# Patient Record
Sex: Male | Born: 1955 | Race: Black or African American | Hispanic: No | Marital: Married | Smoking: Former smoker
Health system: Southern US, Community
[De-identification: ages and names within clinical notes are randomized; demographics above are authoritative.]

## PROBLEM LIST (undated history)

## (undated) DIAGNOSIS — I10 Essential (primary) hypertension: Secondary | ICD-10-CM

## (undated) DIAGNOSIS — I5022 Chronic systolic (congestive) heart failure: Secondary | ICD-10-CM

## (undated) DIAGNOSIS — N182 Chronic kidney disease, stage 2 (mild): Secondary | ICD-10-CM

## (undated) DIAGNOSIS — I480 Paroxysmal atrial fibrillation: Secondary | ICD-10-CM

## (undated) DIAGNOSIS — Z95810 Presence of automatic (implantable) cardiac defibrillator: Secondary | ICD-10-CM

## (undated) DIAGNOSIS — IMO0001 Reserved for inherently not codable concepts without codable children: Secondary | ICD-10-CM

## (undated) DIAGNOSIS — I428 Other cardiomyopathies: Secondary | ICD-10-CM

## (undated) DIAGNOSIS — I639 Cerebral infarction, unspecified: Secondary | ICD-10-CM

## (undated) DIAGNOSIS — G4733 Obstructive sleep apnea (adult) (pediatric): Secondary | ICD-10-CM

## (undated) DIAGNOSIS — E785 Hyperlipidemia, unspecified: Secondary | ICD-10-CM

## (undated) DIAGNOSIS — I472 Ventricular tachycardia, unspecified: Secondary | ICD-10-CM

## (undated) DIAGNOSIS — E876 Hypokalemia: Secondary | ICD-10-CM

## (undated) DIAGNOSIS — I4729 Other ventricular tachycardia: Secondary | ICD-10-CM

## (undated) DIAGNOSIS — Z794 Long term (current) use of insulin: Secondary | ICD-10-CM

## (undated) DIAGNOSIS — I2510 Atherosclerotic heart disease of native coronary artery without angina pectoris: Secondary | ICD-10-CM

## (undated) DIAGNOSIS — E119 Type 2 diabetes mellitus without complications: Secondary | ICD-10-CM

## (undated) DIAGNOSIS — I272 Pulmonary hypertension, unspecified: Secondary | ICD-10-CM

## (undated) HISTORY — PX: CARDIAC CATHETERIZATION: SHX172

## (undated) HISTORY — DX: Cerebral infarction, unspecified: I63.9

## (undated) HISTORY — DX: Type 2 diabetes mellitus without complications: E11.9

## (undated) HISTORY — DX: Atherosclerotic heart disease of native coronary artery without angina pectoris: I25.10

## (undated) HISTORY — DX: Reserved for inherently not codable concepts without codable children: IMO0001

## (undated) HISTORY — DX: Hyperlipidemia, unspecified: E78.5

## (undated) HISTORY — PX: CARDIAC DEFIBRILLATOR PLACEMENT: SHX171

## (undated) HISTORY — DX: Essential (primary) hypertension: I10

## (undated) HISTORY — DX: Paroxysmal atrial fibrillation: I48.0

## (undated) HISTORY — DX: Long term (current) use of insulin: Z79.4

## (undated) HISTORY — PX: LIPOMA EXCISION: SHX5283

## (undated) HISTORY — DX: Hypokalemia: E87.6

## (undated) HISTORY — DX: Obstructive sleep apnea (adult) (pediatric): G47.33

---

## 1997-10-31 ENCOUNTER — Inpatient Hospital Stay (HOSPITAL_COMMUNITY): Admission: AD | Admit: 1997-10-31 | Discharge: 1997-11-04 | Payer: Self-pay | Admitting: Family Medicine

## 1998-05-29 ENCOUNTER — Encounter: Payer: Self-pay | Admitting: Emergency Medicine

## 1998-05-29 ENCOUNTER — Emergency Department (HOSPITAL_COMMUNITY): Admission: EM | Admit: 1998-05-29 | Discharge: 1998-05-29 | Payer: Self-pay | Admitting: Emergency Medicine

## 1998-08-24 ENCOUNTER — Inpatient Hospital Stay (HOSPITAL_COMMUNITY): Admission: EM | Admit: 1998-08-24 | Discharge: 1998-08-27 | Payer: Self-pay | Admitting: Emergency Medicine

## 1998-08-24 ENCOUNTER — Encounter: Payer: Self-pay | Admitting: Emergency Medicine

## 1998-08-27 ENCOUNTER — Encounter: Payer: Self-pay | Admitting: Family Medicine

## 1999-02-10 ENCOUNTER — Encounter: Payer: Self-pay | Admitting: Emergency Medicine

## 1999-02-11 ENCOUNTER — Inpatient Hospital Stay (HOSPITAL_COMMUNITY): Admission: EM | Admit: 1999-02-11 | Discharge: 1999-02-21 | Payer: Self-pay | Admitting: Emergency Medicine

## 1999-02-11 ENCOUNTER — Encounter: Payer: Self-pay | Admitting: Family Medicine

## 1999-02-13 ENCOUNTER — Encounter: Payer: Self-pay | Admitting: Family Medicine

## 2000-02-11 ENCOUNTER — Ambulatory Visit (HOSPITAL_COMMUNITY): Admission: RE | Admit: 2000-02-11 | Discharge: 2000-02-11 | Payer: Self-pay | Admitting: *Deleted

## 2000-02-17 ENCOUNTER — Ambulatory Visit (HOSPITAL_COMMUNITY): Admission: RE | Admit: 2000-02-17 | Discharge: 2000-02-17 | Payer: Self-pay | Admitting: Family Medicine

## 2000-02-17 ENCOUNTER — Encounter: Payer: Self-pay | Admitting: Family Medicine

## 2000-08-25 ENCOUNTER — Encounter: Payer: Self-pay | Admitting: Emergency Medicine

## 2000-08-25 ENCOUNTER — Inpatient Hospital Stay (HOSPITAL_COMMUNITY): Admission: EM | Admit: 2000-08-25 | Discharge: 2000-08-27 | Payer: Self-pay | Admitting: Emergency Medicine

## 2000-12-08 ENCOUNTER — Emergency Department (HOSPITAL_COMMUNITY): Admission: EM | Admit: 2000-12-08 | Discharge: 2000-12-09 | Payer: Self-pay

## 2001-11-27 ENCOUNTER — Emergency Department (HOSPITAL_COMMUNITY): Admission: EM | Admit: 2001-11-27 | Discharge: 2001-11-28 | Payer: Self-pay

## 2001-11-28 ENCOUNTER — Encounter: Payer: Self-pay | Admitting: Emergency Medicine

## 2003-08-21 ENCOUNTER — Emergency Department (HOSPITAL_COMMUNITY): Admission: EM | Admit: 2003-08-21 | Discharge: 2003-08-21 | Payer: Self-pay | Admitting: *Deleted

## 2004-01-10 ENCOUNTER — Inpatient Hospital Stay (HOSPITAL_COMMUNITY): Admission: EM | Admit: 2004-01-10 | Discharge: 2004-01-15 | Payer: Self-pay | Admitting: Emergency Medicine

## 2004-01-11 ENCOUNTER — Encounter: Payer: Self-pay | Admitting: Cardiology

## 2004-01-17 ENCOUNTER — Ambulatory Visit: Payer: Self-pay | Admitting: *Deleted

## 2004-01-22 ENCOUNTER — Ambulatory Visit: Payer: Self-pay | Admitting: Family Medicine

## 2004-01-31 ENCOUNTER — Ambulatory Visit: Payer: Self-pay | Admitting: Family Medicine

## 2004-02-21 ENCOUNTER — Ambulatory Visit: Payer: Self-pay | Admitting: Family Medicine

## 2004-03-11 ENCOUNTER — Ambulatory Visit: Payer: Self-pay | Admitting: Cardiology

## 2004-03-13 ENCOUNTER — Ambulatory Visit: Payer: Self-pay | Admitting: Internal Medicine

## 2004-04-04 ENCOUNTER — Ambulatory Visit: Payer: Self-pay | Admitting: Cardiology

## 2004-05-09 ENCOUNTER — Ambulatory Visit: Payer: Self-pay | Admitting: Cardiology

## 2004-06-19 ENCOUNTER — Ambulatory Visit: Payer: Self-pay

## 2004-07-01 ENCOUNTER — Ambulatory Visit: Payer: Self-pay

## 2004-07-07 ENCOUNTER — Ambulatory Visit: Payer: Self-pay | Admitting: Internal Medicine

## 2004-07-11 ENCOUNTER — Ambulatory Visit (HOSPITAL_COMMUNITY): Admission: RE | Admit: 2004-07-11 | Discharge: 2004-07-11 | Payer: Self-pay | Admitting: Internal Medicine

## 2004-07-16 ENCOUNTER — Ambulatory Visit: Payer: Self-pay | Admitting: Pulmonary Disease

## 2004-07-29 ENCOUNTER — Ambulatory Visit (HOSPITAL_BASED_OUTPATIENT_CLINIC_OR_DEPARTMENT_OTHER): Admission: RE | Admit: 2004-07-29 | Discharge: 2004-07-29 | Payer: Self-pay | Admitting: Pulmonary Disease

## 2004-07-29 ENCOUNTER — Encounter: Payer: Self-pay | Admitting: Pulmonary Disease

## 2004-07-31 ENCOUNTER — Ambulatory Visit: Payer: Self-pay | Admitting: Internal Medicine

## 2004-08-01 ENCOUNTER — Ambulatory Visit: Payer: Self-pay | Admitting: Pulmonary Disease

## 2004-08-06 ENCOUNTER — Ambulatory Visit (HOSPITAL_COMMUNITY): Admission: RE | Admit: 2004-08-06 | Discharge: 2004-08-06 | Payer: Self-pay | Admitting: Internal Medicine

## 2004-08-18 ENCOUNTER — Ambulatory Visit: Payer: Self-pay | Admitting: Internal Medicine

## 2004-09-10 ENCOUNTER — Ambulatory Visit: Payer: Self-pay | Admitting: Pulmonary Disease

## 2004-09-18 ENCOUNTER — Ambulatory Visit: Payer: Self-pay | Admitting: Cardiology

## 2004-12-16 ENCOUNTER — Ambulatory Visit: Payer: Self-pay | Admitting: Cardiology

## 2004-12-23 ENCOUNTER — Ambulatory Visit: Payer: Self-pay | Admitting: Internal Medicine

## 2004-12-29 ENCOUNTER — Ambulatory Visit: Payer: Self-pay | Admitting: Internal Medicine

## 2005-01-13 ENCOUNTER — Ambulatory Visit: Payer: Self-pay | Admitting: Internal Medicine

## 2005-01-29 ENCOUNTER — Ambulatory Visit: Payer: Self-pay | Admitting: Cardiology

## 2005-02-24 ENCOUNTER — Ambulatory Visit: Payer: Self-pay | Admitting: Internal Medicine

## 2005-03-03 ENCOUNTER — Ambulatory Visit: Payer: Self-pay | Admitting: Internal Medicine

## 2005-04-14 ENCOUNTER — Ambulatory Visit: Payer: Self-pay | Admitting: Internal Medicine

## 2005-04-16 ENCOUNTER — Ambulatory Visit: Payer: Self-pay | Admitting: Internal Medicine

## 2005-06-12 ENCOUNTER — Ambulatory Visit: Payer: Self-pay | Admitting: Internal Medicine

## 2005-06-15 ENCOUNTER — Ambulatory Visit: Payer: Self-pay | Admitting: Cardiology

## 2005-06-22 ENCOUNTER — Ambulatory Visit: Payer: Self-pay | Admitting: Cardiology

## 2005-06-24 ENCOUNTER — Ambulatory Visit: Payer: Self-pay | Admitting: Internal Medicine

## 2005-07-01 ENCOUNTER — Ambulatory Visit: Payer: Self-pay | Admitting: Internal Medicine

## 2005-07-01 ENCOUNTER — Ambulatory Visit: Payer: Self-pay

## 2005-07-01 ENCOUNTER — Encounter: Payer: Self-pay | Admitting: Cardiology

## 2005-07-14 ENCOUNTER — Ambulatory Visit: Payer: Self-pay | Admitting: Cardiology

## 2005-09-07 ENCOUNTER — Ambulatory Visit: Payer: Self-pay | Admitting: Internal Medicine

## 2005-09-29 ENCOUNTER — Ambulatory Visit: Payer: Self-pay | Admitting: Internal Medicine

## 2005-09-30 ENCOUNTER — Ambulatory Visit: Payer: Self-pay | Admitting: Internal Medicine

## 2005-10-09 ENCOUNTER — Ambulatory Visit: Payer: Self-pay | Admitting: Internal Medicine

## 2005-11-09 ENCOUNTER — Ambulatory Visit: Payer: Self-pay | Admitting: Internal Medicine

## 2005-11-09 ENCOUNTER — Ambulatory Visit: Payer: Self-pay | Admitting: Cardiology

## 2005-12-18 ENCOUNTER — Inpatient Hospital Stay (HOSPITAL_COMMUNITY): Admission: RE | Admit: 2005-12-18 | Discharge: 2005-12-19 | Payer: Self-pay | Admitting: Internal Medicine

## 2005-12-18 ENCOUNTER — Ambulatory Visit: Payer: Self-pay | Admitting: Internal Medicine

## 2005-12-21 ENCOUNTER — Ambulatory Visit: Payer: Self-pay | Admitting: Internal Medicine

## 2005-12-31 ENCOUNTER — Ambulatory Visit: Payer: Self-pay

## 2005-12-31 ENCOUNTER — Ambulatory Visit: Payer: Self-pay | Admitting: Internal Medicine

## 2006-01-26 ENCOUNTER — Ambulatory Visit: Payer: Self-pay | Admitting: Internal Medicine

## 2006-03-02 ENCOUNTER — Ambulatory Visit: Payer: Self-pay | Admitting: Internal Medicine

## 2006-04-08 ENCOUNTER — Ambulatory Visit: Payer: Self-pay | Admitting: Cardiovascular Disease

## 2006-04-09 ENCOUNTER — Ambulatory Visit: Payer: Self-pay | Admitting: Internal Medicine

## 2006-04-13 ENCOUNTER — Ambulatory Visit: Payer: Self-pay | Admitting: Internal Medicine

## 2006-04-19 ENCOUNTER — Ambulatory Visit: Payer: Self-pay

## 2006-04-19 ENCOUNTER — Encounter: Payer: Self-pay | Admitting: Cardiology

## 2006-04-29 ENCOUNTER — Ambulatory Visit: Payer: Self-pay | Admitting: Cardiology

## 2006-05-13 ENCOUNTER — Ambulatory Visit: Payer: Self-pay | Admitting: Internal Medicine

## 2006-06-01 ENCOUNTER — Ambulatory Visit: Payer: Self-pay | Admitting: Cardiology

## 2006-06-04 ENCOUNTER — Ambulatory Visit: Payer: Self-pay | Admitting: Cardiology

## 2006-06-04 LAB — CONVERTED CEMR LAB
BUN: 10 mg/dL (ref 6–23)
Calcium: 8.9 mg/dL (ref 8.4–10.5)
GFR calc Af Amer: 115 mL/min
GFR calc non Af Amer: 95 mL/min
Potassium: 3.5 meq/L (ref 3.5–5.1)

## 2006-06-25 ENCOUNTER — Ambulatory Visit: Payer: Self-pay | Admitting: Internal Medicine

## 2006-06-25 LAB — CONVERTED CEMR LAB
CO2: 30 meq/L (ref 19–32)
Creatinine, Ser: 1 mg/dL (ref 0.4–1.5)
GFR calc Af Amer: 102 mL/min

## 2006-07-29 ENCOUNTER — Ambulatory Visit: Payer: Self-pay | Admitting: Internal Medicine

## 2006-07-29 LAB — CONVERTED CEMR LAB
Calcium: 8.8 mg/dL (ref 8.4–10.5)
Chloride: 110 meq/L (ref 96–112)
GFR calc non Af Amer: 84 mL/min
Pro B Natriuretic peptide (BNP): 303 pg/mL — ABNORMAL HIGH (ref 0.0–100.0)
Sodium: 143 meq/L (ref 135–145)

## 2006-08-02 ENCOUNTER — Ambulatory Visit: Payer: Self-pay | Admitting: Internal Medicine

## 2006-08-02 LAB — CONVERTED CEMR LAB
Creatinine,U: 156.6 mg/dL
Hgb A1c MFr Bld: 5.6 % (ref 4.6–6.0)
Microalb Creat Ratio: 4.5 mg/g (ref 0.0–30.0)

## 2006-08-16 ENCOUNTER — Ambulatory Visit: Payer: Self-pay

## 2006-09-02 ENCOUNTER — Ambulatory Visit: Payer: Self-pay | Admitting: Cardiology

## 2006-11-11 ENCOUNTER — Ambulatory Visit: Payer: Self-pay | Admitting: Cardiovascular Disease

## 2006-12-02 ENCOUNTER — Ambulatory Visit: Payer: Self-pay | Admitting: Internal Medicine

## 2006-12-06 ENCOUNTER — Ambulatory Visit: Payer: Self-pay | Admitting: Internal Medicine

## 2006-12-06 LAB — CONVERTED CEMR LAB
ALT: 34 units/L (ref 0–53)
Albumin: 3.5 g/dL (ref 3.5–5.2)
Alkaline Phosphatase: 52 units/L (ref 39–117)
LDL Cholesterol: 96 mg/dL (ref 0–99)

## 2006-12-14 ENCOUNTER — Ambulatory Visit: Payer: Self-pay | Admitting: Cardiology

## 2006-12-15 ENCOUNTER — Ambulatory Visit: Payer: Self-pay | Admitting: Internal Medicine

## 2007-01-05 ENCOUNTER — Ambulatory Visit: Payer: Self-pay | Admitting: Gastroenterology

## 2007-02-02 ENCOUNTER — Ambulatory Visit: Payer: Self-pay | Admitting: Internal Medicine

## 2007-02-07 ENCOUNTER — Ambulatory Visit: Payer: Self-pay | Admitting: Gastroenterology

## 2007-02-07 LAB — HM COLONOSCOPY: HM Colonoscopy: ABNORMAL

## 2007-03-21 ENCOUNTER — Ambulatory Visit: Payer: Self-pay

## 2007-05-06 DIAGNOSIS — D179 Benign lipomatous neoplasm, unspecified: Secondary | ICD-10-CM | POA: Insufficient documentation

## 2007-05-06 DIAGNOSIS — E785 Hyperlipidemia, unspecified: Secondary | ICD-10-CM | POA: Insufficient documentation

## 2007-05-06 DIAGNOSIS — M109 Gout, unspecified: Secondary | ICD-10-CM | POA: Insufficient documentation

## 2007-05-06 DIAGNOSIS — I6789 Other cerebrovascular disease: Secondary | ICD-10-CM

## 2007-05-06 DIAGNOSIS — I5042 Chronic combined systolic (congestive) and diastolic (congestive) heart failure: Secondary | ICD-10-CM

## 2007-05-06 DIAGNOSIS — K029 Dental caries, unspecified: Secondary | ICD-10-CM | POA: Insufficient documentation

## 2007-05-09 ENCOUNTER — Telehealth: Payer: Self-pay | Admitting: Internal Medicine

## 2007-05-09 ENCOUNTER — Ambulatory Visit: Payer: Self-pay | Admitting: Internal Medicine

## 2007-05-09 DIAGNOSIS — E876 Hypokalemia: Secondary | ICD-10-CM

## 2007-05-09 DIAGNOSIS — J069 Acute upper respiratory infection, unspecified: Secondary | ICD-10-CM | POA: Insufficient documentation

## 2007-05-09 DIAGNOSIS — I119 Hypertensive heart disease without heart failure: Secondary | ICD-10-CM

## 2007-05-11 LAB — CONVERTED CEMR LAB
ALT: 19 units/L (ref 0–53)
Alkaline Phosphatase: 58 units/L (ref 39–117)
BUN: 13 mg/dL (ref 6–23)
CO2: 34 meq/L — ABNORMAL HIGH (ref 19–32)
Calcium: 8.9 mg/dL (ref 8.4–10.5)
GFR calc Af Amer: 91 mL/min
GFR calc non Af Amer: 75 mL/min
Hgb A1c MFr Bld: 5.5 % (ref 4.6–6.0)
Total CHOL/HDL Ratio: 7
Triglycerides: 92 mg/dL (ref 0–149)

## 2007-05-17 ENCOUNTER — Telehealth: Payer: Self-pay | Admitting: Internal Medicine

## 2007-05-17 ENCOUNTER — Ambulatory Visit: Payer: Self-pay | Admitting: Internal Medicine

## 2007-05-25 LAB — CONVERTED CEMR LAB
BUN: 8 mg/dL (ref 6–23)
CO2: 32 meq/L (ref 19–32)
Creatinine, Ser: 1.1 mg/dL (ref 0.4–1.5)
GFR calc Af Amer: 91 mL/min
GFR calc non Af Amer: 75 mL/min
Potassium: 3.6 meq/L (ref 3.5–5.1)

## 2007-06-09 ENCOUNTER — Ambulatory Visit: Payer: Self-pay

## 2007-06-15 ENCOUNTER — Ambulatory Visit: Payer: Self-pay | Admitting: Cardiology

## 2007-07-15 ENCOUNTER — Encounter: Payer: Self-pay | Admitting: Internal Medicine

## 2007-08-04 ENCOUNTER — Encounter: Payer: Self-pay | Admitting: Internal Medicine

## 2007-09-06 ENCOUNTER — Ambulatory Visit: Payer: Self-pay | Admitting: Internal Medicine

## 2007-09-14 ENCOUNTER — Ambulatory Visit: Payer: Self-pay

## 2007-09-20 ENCOUNTER — Ambulatory Visit (HOSPITAL_COMMUNITY): Admission: RE | Admit: 2007-09-20 | Discharge: 2007-09-20 | Payer: Self-pay | Admitting: General Surgery

## 2007-09-20 ENCOUNTER — Encounter: Payer: Self-pay | Admitting: Internal Medicine

## 2007-09-20 ENCOUNTER — Encounter (INDEPENDENT_AMBULATORY_CARE_PROVIDER_SITE_OTHER): Payer: Self-pay | Admitting: General Surgery

## 2007-10-11 ENCOUNTER — Ambulatory Visit: Payer: Self-pay | Admitting: Internal Medicine

## 2007-10-11 LAB — CONVERTED CEMR LAB
ALT: 29 units/L (ref 0–53)
AST: 27 units/L (ref 0–37)
Cholesterol: 144 mg/dL (ref 0–200)
HDL: 33.5 mg/dL — ABNORMAL LOW (ref >39.0)
Hgb A1c MFr Bld: 5.9 % (ref 4.6–6.0)
TSH: 0.75 microintl units/mL (ref 0.35–5.50)
Triglycerides: 62 mg/dL (ref 0–149)
VLDL: 12 mg/dL (ref 0–40)

## 2007-10-14 ENCOUNTER — Encounter: Payer: Self-pay | Admitting: Internal Medicine

## 2007-10-18 ENCOUNTER — Ambulatory Visit: Payer: Self-pay | Admitting: Internal Medicine

## 2007-12-06 ENCOUNTER — Ambulatory Visit: Payer: Self-pay | Admitting: Internal Medicine

## 2007-12-06 DIAGNOSIS — Z8679 Personal history of other diseases of the circulatory system: Secondary | ICD-10-CM

## 2007-12-09 ENCOUNTER — Ambulatory Visit: Payer: Self-pay | Admitting: Cardiology

## 2007-12-21 ENCOUNTER — Ambulatory Visit: Payer: Self-pay

## 2007-12-21 ENCOUNTER — Encounter: Payer: Self-pay | Admitting: Cardiology

## 2007-12-21 ENCOUNTER — Telehealth (INDEPENDENT_AMBULATORY_CARE_PROVIDER_SITE_OTHER): Payer: Self-pay | Admitting: *Deleted

## 2007-12-23 ENCOUNTER — Ambulatory Visit: Payer: Self-pay | Admitting: Internal Medicine

## 2008-01-04 ENCOUNTER — Ambulatory Visit: Payer: Self-pay | Admitting: Cardiology

## 2008-01-04 LAB — CONVERTED CEMR LAB
CO2: 30 meq/L (ref 19–32)
Glucose, Bld: 118 mg/dL — ABNORMAL HIGH (ref 70–99)
Hemoglobin: 13.9 g/dL (ref 13.0–17.0)
INR: 1.1 — ABNORMAL HIGH (ref 0.8–1.0)
Lymphocytes Relative: 30.9 % (ref 12.0–46.0)
MCHC: 33.6 g/dL (ref 30.0–36.0)
Platelets: 195 10*3/uL (ref 150–400)
Potassium: 3 meq/L — ABNORMAL LOW (ref 3.5–5.1)
Prothrombin Time: 12.9 s (ref 10.9–13.3)
RDW: 12 % (ref 11.5–14.6)
Sodium: 145 meq/L (ref 135–145)

## 2008-01-10 ENCOUNTER — Ambulatory Visit: Payer: Self-pay | Admitting: Cardiology

## 2008-01-10 ENCOUNTER — Inpatient Hospital Stay (HOSPITAL_BASED_OUTPATIENT_CLINIC_OR_DEPARTMENT_OTHER): Admission: RE | Admit: 2008-01-10 | Discharge: 2008-01-10 | Payer: Self-pay | Admitting: Cardiology

## 2008-01-19 ENCOUNTER — Ambulatory Visit: Payer: Self-pay | Admitting: Cardiology

## 2008-02-01 ENCOUNTER — Ambulatory Visit: Payer: Self-pay

## 2008-02-24 ENCOUNTER — Ambulatory Visit (HOSPITAL_BASED_OUTPATIENT_CLINIC_OR_DEPARTMENT_OTHER): Admission: RE | Admit: 2008-02-24 | Discharge: 2008-02-24 | Payer: Self-pay | Admitting: Cardiology

## 2008-02-24 ENCOUNTER — Encounter: Payer: Self-pay | Admitting: Pulmonary Disease

## 2008-03-02 ENCOUNTER — Ambulatory Visit: Payer: Self-pay | Admitting: Pulmonary Disease

## 2008-04-23 ENCOUNTER — Ambulatory Visit: Payer: Self-pay

## 2008-05-10 ENCOUNTER — Ambulatory Visit: Payer: Self-pay | Admitting: Cardiology

## 2008-05-14 ENCOUNTER — Ambulatory Visit: Payer: Self-pay | Admitting: Internal Medicine

## 2008-05-14 ENCOUNTER — Ambulatory Visit: Payer: Self-pay | Admitting: Cardiology

## 2008-05-14 LAB — CONVERTED CEMR LAB
ALT: 19 units/L (ref 0–53)
Calcium: 9.1 mg/dL (ref 8.4–10.5)
Cholesterol: 167 mg/dL (ref 0–200)
GFR calc Af Amer: 90 mL/min
LDL Cholesterol: 117 mg/dL — ABNORMAL HIGH (ref 0–99)
Potassium: 3.8 meq/L (ref 3.5–5.1)
TSH: 1.21 microintl units/mL (ref 0.35–5.50)
Total Bilirubin: 1.3 mg/dL — ABNORMAL HIGH (ref 0.3–1.2)
Total Protein: 7.2 g/dL (ref 6.0–8.3)
Triglycerides: 69 mg/dL (ref 0–149)
VLDL: 14 mg/dL (ref 0–40)

## 2008-05-18 ENCOUNTER — Ambulatory Visit: Payer: Self-pay | Admitting: Cardiovascular Disease

## 2008-05-23 ENCOUNTER — Ambulatory Visit: Payer: Self-pay | Admitting: Cardiovascular Disease

## 2008-06-01 ENCOUNTER — Ambulatory Visit: Payer: Self-pay | Admitting: Cardiovascular Disease

## 2008-06-05 ENCOUNTER — Encounter: Payer: Self-pay | Admitting: Internal Medicine

## 2008-06-11 ENCOUNTER — Ambulatory Visit: Payer: Self-pay | Admitting: Pulmonary Disease

## 2008-06-11 ENCOUNTER — Ambulatory Visit: Payer: Self-pay | Admitting: Internal Medicine

## 2008-06-11 DIAGNOSIS — G4733 Obstructive sleep apnea (adult) (pediatric): Secondary | ICD-10-CM

## 2008-06-15 ENCOUNTER — Ambulatory Visit: Payer: Self-pay | Admitting: Internal Medicine

## 2008-06-20 ENCOUNTER — Encounter: Payer: Self-pay | Admitting: Internal Medicine

## 2008-06-27 ENCOUNTER — Ambulatory Visit: Payer: Self-pay | Admitting: Cardiology

## 2008-07-06 ENCOUNTER — Ambulatory Visit: Payer: Self-pay | Admitting: Internal Medicine

## 2008-07-10 ENCOUNTER — Encounter: Payer: Self-pay | Admitting: Internal Medicine

## 2008-07-11 ENCOUNTER — Ambulatory Visit: Payer: Self-pay | Admitting: Cardiology

## 2008-07-12 ENCOUNTER — Ambulatory Visit: Payer: Self-pay | Admitting: Pulmonary Disease

## 2008-07-12 ENCOUNTER — Ambulatory Visit: Payer: Self-pay | Admitting: Internal Medicine

## 2008-07-12 LAB — CONVERTED CEMR LAB
Albumin: 3.6 g/dL (ref 3.5–5.2)
Alkaline Phosphatase: 50 units/L (ref 39–117)
Creatinine,U: 288.9 mg/dL
Microalb Creat Ratio: 4.5 mg/g (ref 0.0–30.0)
Total CHOL/HDL Ratio: 4.1
Total Protein: 6.9 g/dL (ref 6.0–8.3)

## 2008-07-20 ENCOUNTER — Ambulatory Visit: Payer: Self-pay | Admitting: Cardiology

## 2008-07-24 ENCOUNTER — Ambulatory Visit: Payer: Self-pay | Admitting: Internal Medicine

## 2008-07-24 ENCOUNTER — Telehealth (INDEPENDENT_AMBULATORY_CARE_PROVIDER_SITE_OTHER): Payer: Self-pay | Admitting: *Deleted

## 2008-07-24 DIAGNOSIS — M109 Gout, unspecified: Secondary | ICD-10-CM | POA: Insufficient documentation

## 2008-07-27 ENCOUNTER — Ambulatory Visit: Payer: Self-pay | Admitting: Cardiology

## 2008-07-28 ENCOUNTER — Encounter: Payer: Self-pay | Admitting: Pulmonary Disease

## 2008-08-07 ENCOUNTER — Ambulatory Visit: Payer: Self-pay | Admitting: Cardiovascular Disease

## 2008-08-10 ENCOUNTER — Ambulatory Visit: Payer: Self-pay

## 2008-08-28 ENCOUNTER — Ambulatory Visit: Payer: Self-pay | Admitting: Cardiology

## 2008-08-28 ENCOUNTER — Ambulatory Visit: Payer: Self-pay | Admitting: Internal Medicine

## 2008-08-28 DIAGNOSIS — I472 Ventricular tachycardia, unspecified: Secondary | ICD-10-CM | POA: Insufficient documentation

## 2008-08-28 DIAGNOSIS — I4891 Unspecified atrial fibrillation: Secondary | ICD-10-CM

## 2008-09-07 ENCOUNTER — Ambulatory Visit: Payer: Self-pay | Admitting: Cardiovascular Disease

## 2008-09-21 ENCOUNTER — Ambulatory Visit: Payer: Self-pay | Admitting: Internal Medicine

## 2008-10-03 ENCOUNTER — Encounter: Payer: Self-pay | Admitting: *Deleted

## 2008-10-11 ENCOUNTER — Ambulatory Visit: Payer: Self-pay | Admitting: Cardiology

## 2008-10-11 ENCOUNTER — Ambulatory Visit: Payer: Self-pay | Admitting: Cardiovascular Disease

## 2008-10-11 ENCOUNTER — Encounter: Payer: Self-pay | Admitting: Internal Medicine

## 2008-10-11 DIAGNOSIS — I2510 Atherosclerotic heart disease of native coronary artery without angina pectoris: Secondary | ICD-10-CM

## 2008-10-11 LAB — CONVERTED CEMR LAB
POC INR: 2.2
Protime: 18.2

## 2008-11-07 ENCOUNTER — Encounter: Payer: Self-pay | Admitting: *Deleted

## 2008-11-08 ENCOUNTER — Ambulatory Visit: Payer: Self-pay | Admitting: Internal Medicine

## 2008-11-08 LAB — CONVERTED CEMR LAB
POC INR: 2.4
Prothrombin Time: 18.8 s

## 2008-11-30 ENCOUNTER — Encounter: Payer: Self-pay | Admitting: Cardiology

## 2008-12-06 ENCOUNTER — Ambulatory Visit: Payer: Self-pay | Admitting: Cardiology

## 2008-12-06 LAB — CONVERTED CEMR LAB
POC INR: 1.3
Prothrombin Time: 14.2 s

## 2008-12-10 ENCOUNTER — Ambulatory Visit: Payer: Self-pay | Admitting: Internal Medicine

## 2008-12-10 DIAGNOSIS — R509 Fever, unspecified: Secondary | ICD-10-CM

## 2008-12-10 LAB — CONVERTED CEMR LAB
BUN: 12 mg/dL (ref 6–23)
CO2: 31 meq/L (ref 19–32)
Chloride: 110 meq/L (ref 96–112)
Creatinine, Ser: 1 mg/dL (ref 0.4–1.5)
Hgb A1c MFr Bld: 5.5 % (ref 4.6–6.5)
LDL Cholesterol: 150 mg/dL — ABNORMAL HIGH (ref 0–99)
Total CHOL/HDL Ratio: 6
Triglycerides: 60 mg/dL (ref 0.0–149.0)
VLDL: 12 mg/dL (ref 0.0–40.0)

## 2008-12-13 ENCOUNTER — Ambulatory Visit: Payer: Self-pay | Admitting: Internal Medicine

## 2008-12-13 LAB — CONVERTED CEMR LAB: POC INR: 2.2

## 2009-01-10 ENCOUNTER — Encounter: Payer: Self-pay | Admitting: Internal Medicine

## 2009-01-10 ENCOUNTER — Ambulatory Visit: Payer: Self-pay | Admitting: Cardiovascular Disease

## 2009-01-10 ENCOUNTER — Ambulatory Visit: Payer: Self-pay

## 2009-01-10 ENCOUNTER — Ambulatory Visit: Payer: Self-pay | Admitting: Pulmonary Disease

## 2009-01-10 LAB — CONVERTED CEMR LAB: POC INR: 2.8

## 2009-01-31 ENCOUNTER — Telehealth (INDEPENDENT_AMBULATORY_CARE_PROVIDER_SITE_OTHER): Payer: Self-pay | Admitting: *Deleted

## 2009-02-07 ENCOUNTER — Ambulatory Visit: Payer: Self-pay | Admitting: Internal Medicine

## 2009-02-07 LAB — CONVERTED CEMR LAB: POC INR: 1.7

## 2009-02-21 ENCOUNTER — Ambulatory Visit: Payer: Self-pay | Admitting: Cardiology

## 2009-02-21 LAB — CONVERTED CEMR LAB
INR: 1.4
POC INR: 1.4

## 2009-02-25 ENCOUNTER — Ambulatory Visit: Payer: Self-pay | Admitting: Internal Medicine

## 2009-03-01 ENCOUNTER — Ambulatory Visit: Payer: Self-pay | Admitting: Internal Medicine

## 2009-03-01 LAB — CONVERTED CEMR LAB: POC INR: 1.7

## 2009-03-15 ENCOUNTER — Ambulatory Visit: Payer: Self-pay | Admitting: Cardiology

## 2009-03-15 LAB — CONVERTED CEMR LAB: POC INR: 1.6

## 2009-03-29 ENCOUNTER — Ambulatory Visit: Payer: Self-pay | Admitting: Internal Medicine

## 2009-03-29 LAB — CONVERTED CEMR LAB: POC INR: 2.5

## 2009-04-11 ENCOUNTER — Ambulatory Visit: Payer: Self-pay | Admitting: Cardiology

## 2009-04-11 ENCOUNTER — Ambulatory Visit: Payer: Self-pay | Admitting: Cardiovascular Disease

## 2009-04-11 ENCOUNTER — Encounter: Payer: Self-pay | Admitting: Internal Medicine

## 2009-05-02 ENCOUNTER — Encounter (INDEPENDENT_AMBULATORY_CARE_PROVIDER_SITE_OTHER): Payer: Self-pay | Admitting: Cardiology

## 2009-05-02 ENCOUNTER — Ambulatory Visit: Payer: Self-pay | Admitting: Internal Medicine

## 2009-05-02 LAB — CONVERTED CEMR LAB: POC INR: 2.3

## 2009-05-17 ENCOUNTER — Telehealth: Payer: Self-pay | Admitting: Internal Medicine

## 2009-05-30 ENCOUNTER — Ambulatory Visit: Payer: Self-pay | Admitting: Internal Medicine

## 2009-05-30 LAB — CONVERTED CEMR LAB: POC INR: 2

## 2009-06-17 ENCOUNTER — Ambulatory Visit: Payer: Self-pay | Admitting: Internal Medicine

## 2009-06-17 DIAGNOSIS — R5381 Other malaise: Secondary | ICD-10-CM

## 2009-06-17 DIAGNOSIS — R5383 Other fatigue: Secondary | ICD-10-CM

## 2009-06-17 LAB — CONVERTED CEMR LAB
Bilirubin Urine: NEGATIVE
CO2: 32 meq/L (ref 19–32)
Chloride: 110 meq/L (ref 96–112)
Eosinophils Relative: 1.5 % (ref 0.0–5.0)
Folate: 9.8 ng/mL
Glucose, Bld: 119 mg/dL — ABNORMAL HIGH (ref 70–99)
Hemoglobin, Urine: NEGATIVE
Hgb A1c MFr Bld: 5.8 % (ref 4.6–6.5)
Iron: 72 ug/dL (ref 42–165)
Ketones, ur: NEGATIVE mg/dL
LDL Cholesterol: 103 mg/dL — ABNORMAL HIGH (ref 0–99)
Leukocytes, UA: NEGATIVE
Lymphocytes Relative: 22.4 % (ref 12.0–46.0)
MCV: 97.9 fL (ref 78.0–100.0)
Microalb Creat Ratio: 15.1 mg/g (ref 0.0–30.0)
Monocytes Absolute: 0.5 10*3/uL (ref 0.1–1.0)
Neutrophils Relative %: 63.3 % (ref 43.0–77.0)
Platelets: 171 10*3/uL (ref 150.0–400.0)
Saturation Ratios: 21.5 % (ref 20.0–50.0)
Sed Rate: 7 mm/hr (ref 0–22)
Sodium: 145 meq/L (ref 135–145)
Specific Gravity, Urine: 1.01 (ref 1.000–1.030)
TSH: 0.83 microintl units/mL (ref 0.35–5.50)
Total CHOL/HDL Ratio: 3
Transferrin: 238.9 mg/dL (ref 212.0–360.0)
Uric Acid, Serum: 6.2 mg/dL (ref 4.0–7.8)
Urobilinogen, UA: 1 (ref 0.0–1.0)
WBC: 4.3 10*3/uL — ABNORMAL LOW (ref 4.5–10.5)

## 2009-06-18 LAB — CONVERTED CEMR LAB: Vit D, 25-Hydroxy: 36 ng/mL (ref 30–89)

## 2009-06-27 ENCOUNTER — Ambulatory Visit: Payer: Self-pay | Admitting: Cardiology

## 2009-06-27 LAB — CONVERTED CEMR LAB: POC INR: 2

## 2009-07-04 ENCOUNTER — Encounter: Payer: Self-pay | Admitting: Internal Medicine

## 2009-07-04 ENCOUNTER — Ambulatory Visit: Payer: Self-pay

## 2009-07-16 ENCOUNTER — Encounter: Payer: Self-pay | Admitting: Internal Medicine

## 2009-07-26 ENCOUNTER — Ambulatory Visit: Payer: Self-pay | Admitting: Internal Medicine

## 2009-08-15 ENCOUNTER — Ambulatory Visit: Payer: Self-pay | Admitting: Internal Medicine

## 2009-08-15 ENCOUNTER — Ambulatory Visit: Payer: Self-pay | Admitting: Cardiology

## 2009-08-15 DIAGNOSIS — Z95810 Presence of automatic (implantable) cardiac defibrillator: Secondary | ICD-10-CM

## 2009-09-12 ENCOUNTER — Ambulatory Visit: Payer: Self-pay | Admitting: Internal Medicine

## 2009-09-12 LAB — CONVERTED CEMR LAB: POC INR: 2.5

## 2009-10-11 ENCOUNTER — Ambulatory Visit: Payer: Self-pay | Admitting: Cardiovascular Disease

## 2009-10-28 ENCOUNTER — Telehealth: Payer: Self-pay | Admitting: Internal Medicine

## 2009-11-08 ENCOUNTER — Ambulatory Visit: Payer: Self-pay | Admitting: Cardiovascular Disease

## 2009-11-11 ENCOUNTER — Encounter: Payer: Self-pay | Admitting: Internal Medicine

## 2009-11-11 ENCOUNTER — Ambulatory Visit: Payer: Self-pay | Admitting: Internal Medicine

## 2009-12-06 ENCOUNTER — Ambulatory Visit: Payer: Self-pay | Admitting: Cardiovascular Disease

## 2009-12-11 ENCOUNTER — Telehealth: Payer: Self-pay | Admitting: Internal Medicine

## 2009-12-24 ENCOUNTER — Ambulatory Visit: Payer: Self-pay | Admitting: Internal Medicine

## 2010-01-03 ENCOUNTER — Ambulatory Visit: Payer: Self-pay | Admitting: Cardiology

## 2010-01-09 ENCOUNTER — Ambulatory Visit: Payer: Self-pay | Admitting: Pulmonary Disease

## 2010-01-09 ENCOUNTER — Ambulatory Visit: Payer: Self-pay | Admitting: Internal Medicine

## 2010-01-09 LAB — CONVERTED CEMR LAB
BUN: 10 mg/dL (ref 6–23)
CO2: 31 meq/L (ref 19–32)
Calcium: 8.8 mg/dL (ref 8.4–10.5)
Chloride: 106 meq/L (ref 96–112)
Cholesterol: 141 mg/dL (ref 0–200)
Creatinine, Ser: 1.1 mg/dL (ref 0.4–1.5)
GFR calc non Af Amer: 92.72 mL/min (ref >60)
Glucose, Bld: 122 mg/dL — ABNORMAL HIGH (ref 70–99)
HDL: 35 mg/dL — ABNORMAL LOW (ref >39.00)
Hgb A1c MFr Bld: 6 % (ref 4.6–6.5)
LDL Cholesterol: 90 mg/dL (ref 0–99)
Potassium: 3.8 meq/L (ref 3.5–5.1)
Sodium: 144 meq/L (ref 135–145)
Total CHOL/HDL Ratio: 4
Triglycerides: 80 mg/dL (ref 0.0–149.0)
VLDL: 16 mg/dL (ref 0.0–40.0)

## 2010-01-31 ENCOUNTER — Encounter (INDEPENDENT_AMBULATORY_CARE_PROVIDER_SITE_OTHER): Payer: Self-pay | Admitting: *Deleted

## 2010-01-31 ENCOUNTER — Ambulatory Visit: Payer: Self-pay | Admitting: Cardiology

## 2010-02-13 ENCOUNTER — Ambulatory Visit: Payer: Self-pay

## 2010-02-13 ENCOUNTER — Ambulatory Visit: Payer: Self-pay | Admitting: Internal Medicine

## 2010-02-13 LAB — CONVERTED CEMR LAB: POC INR: 1.9

## 2010-02-18 ENCOUNTER — Ambulatory Visit: Payer: Self-pay | Admitting: Internal Medicine

## 2010-03-01 ENCOUNTER — Encounter: Payer: Self-pay | Admitting: Pulmonary Disease

## 2010-03-07 ENCOUNTER — Ambulatory Visit: Payer: Self-pay | Admitting: Cardiology

## 2010-03-07 LAB — CONVERTED CEMR LAB: POC INR: 2.9

## 2010-03-17 ENCOUNTER — Telehealth: Payer: Self-pay | Admitting: Internal Medicine

## 2010-04-11 ENCOUNTER — Ambulatory Visit: Payer: Self-pay | Admitting: Internal Medicine

## 2010-05-06 ENCOUNTER — Ambulatory Visit: Admission: RE | Admit: 2010-05-06 | Discharge: 2010-05-06 | Payer: Self-pay | Source: Home / Self Care

## 2010-05-21 ENCOUNTER — Encounter: Payer: Self-pay | Admitting: Internal Medicine

## 2010-05-21 ENCOUNTER — Ambulatory Visit: Admission: RE | Admit: 2010-05-21 | Discharge: 2010-05-21 | Payer: Self-pay | Source: Home / Self Care

## 2010-05-25 ENCOUNTER — Encounter: Payer: Self-pay | Admitting: Internal Medicine

## 2010-06-03 NOTE — Progress Notes (Signed)
Summary: refill--Klor Con  Phone Note Refill Request Message from:  Fax from [address] on December 11, 2009 10:41 AM  Refills Requested: Medication #1:  KLOR-CON M20 20 MEQ  TBCR 2 tablets by mouth three times a day   Dosage confirmed as above?Dosage Confirmed   Supply Requested: 120   Last Refilled: 09/30/2009 Next Appointment Scheduled: 12-18-09  Dr Jenny Reichmann Initial call taken by: Kelle Darting CMA Deborra Medina),  December 11, 2009 10:41 AM    Prescriptions: KLOR-CON M20 20 MEQ  TBCR (POTASSIUM CHLORIDE CRYS CR) 2 tablets by mouth three times a day  #120 Tablet x 0   Entered by:   Kelle Darting CMA (Elizabeth)   Authorized by:   Biagio Borg MD   Signed by:   Kelle Darting CMA (Hercules) on 12/11/2009   Method used:   [facility]        [facility] [address] [phone][facility] (retail)       [address]       [place], [place]  [address]       Ph: [phone] or [phone]       Fax: [phone]   RxID:   [id number]

## 2010-06-03 NOTE — Miscellaneous (Signed)
   Clinical Lists Changes  Orders: Added new Referral order of DME Referral (DME) - Signed optimal pressure 11cm, but compliance was poor.  Pressure optimization is not very accurate due to noncompliance.  will work on this and repeat.

## 2010-06-03 NOTE — Medication Information (Signed)
Summary: rov/ewj  Anticoagulant Therapy  Managed by: Freddrick March, RN, BSN PCP: Dr Cathlean Cower Supervising MD: Verl Blalock MD, Marcello Moores Indication 1: Atrial Fibrillation (ICD-427.31) Lab Used: [facility] Site: [facility] INR POC 1.8 INR RANGE 2 - 3  Dietary changes: no    Health status changes: no    Bleeding/hemorrhagic complications: no    Recent/future hospitalizations: no    Any changes in medication regimen? yes       Details: Started on Lipitor2-3 weeks ago, d/c Simvastatin. Started new fiber supplement.  Recent/future dental: no  Any missed doses?: yes     Details: May have missed 1 dose last week.   Is patient compliant with meds? yes       Allergies: No Known Drug Allergies  Anticoagulation Management History:      The patient is taking warfarin and comes in today for a routine follow up visit.  Positive risk factors for bleeding include presence of serious comorbidities.  Negative risk factors for bleeding include an age less than 73 years old.  The bleeding index is 'intermediate risk'.  Positive CHADS2 values include History of CHF, History of HTN, and History of Diabetes.  Negative CHADS2 values include Age > 9 years old.  The start date was 05/10/2008.  His last INR was 1.4.  Anticoagulation responsible Emira Eubanks: Verl Blalock MD, Marcello Moores.  INR POC: 1.8.  Cuvette Lot#: [id number].  Exp: 02/2011.    Anticoagulation Management Assessment/Plan:      The patient's current anticoagulation dose is Warfarin sodium 5 mg tabs: use as directed.  The target INR is 2 - 3.  The next INR is due 01/31/2010.  Anticoagulation instructions were given to patient.  Results were reviewed/authorized by Freddrick March, RN, BSN.  He was notified by Freddrick March, RN, BSN.         Prior Anticoagulation Instructions: INR 3.2  Take 5mg  today, then resume same dosage 15mg  daily except 10mg  on Thursdays.  Recheck in 4 weeks.    Current Anticoagulation Instructions: INR 1.8  Take an extra 5mg  tablet  today, then resume same dosage 3 tablets daily except 2 tablets on Thursdays.  Recheck in 3-4 weeks.

## 2010-06-03 NOTE — Cardiovascular Report (Signed)
Summary: Office Visit   Office Visit   Imported By: Sallee Provencal 05/16/2009 16:04:01  _____________________________________________________________________  External Attachment:    Type:   Image     Comment:   External Document

## 2010-06-03 NOTE — Procedures (Signed)
Summary: device/saf    Allergies: No Known Drug Allergies    ICD Specifications Following MD:  Cristopher Peru, MD     ICD Vendor:  [employer] Jude     ICD Model Number:  [phone]     ICD Serial Number:  [id number] ICD DOI:  12/18/2005     ICD Implanting MD:  Cristopher Peru, MD  Lead 1:    Location: RV     DOI: 12/18/2005     Model #: [id number]     Serial #: [id number]     Status: active  Indications::  NICM   ICD Follow Up Remote Check?  No Battery Voltage:  2.95 V     Charge Time:  10.5 seconds     Underlying rhythm:  SR ICD Dependent:  No       ICD Device Measurements Right Ventricle:  Amplitude: 12 mV, Impedance: 455 ohms, Threshold: 1.25 V at 0.5 msec Shock Impedance: 40 ohms   Episodes Coumadin:  Yes Shock:  0     ATP:  0     Nonsustained:  0     Ventricular Pacing:  <1%  Brady Parameters Mode VVI     Lower Rate Limit:  40      Tachy Zones VF:  240     VT:  214     VT1:  188     Next Cardiology Appt Due:  10/02/2009 Tech Comments:  Normal device function.  No changes made today.  ROV 3 months GT. Chanetta Marshall RN BSN  July 05, 2009 8:28 AM  MD Comments:  Agree with above.

## 2010-06-03 NOTE — Procedures (Signed)
Summary: device check      Allergies Added: NKDA  Current Medications (verified): 1)  Coreg 25 Mg Tabs (Carvedilol) .... Take 2 Tablets By Mouth Two Times A Day 2)  Lantus Solostar 100 Unit/ml  Soln (Insulin Glargine) .... Use Asd 10 Units Subcutaneously Once Daily 3)  Warfarin Sodium 5 Mg Tabs (Warfarin Sodium) .... Use As Directed 4)  Metformin Hcl 500 Mg  Tabs (Metformin Hcl) .... Take 1 Tablet By Mouth Two Times A Day 5)  Isosorbide Dinitrate 20 Mg Tabs (Isosorbide Dinitrate) .[name].. 1 By Mouth Three Times A Day 6)  Hydralazine Hcl 25 Mg  Tabs (Hydralazine Hcl) .... Take 1 Tablet By Mouth Three Times A Day 7)  Klor-Con M20 20 Meq  Tbcr (Potassium Chloride Crys Cr) .... 2 Tablets By Mouth Three Times A Day 8)  Lasix 80 Mg  Tabs (Furosemide) .... Take 1 Tablet By Mouth Two Times A Day 9)  Simvastatin 80 Mg Tabs (Simvastatin) .[name].. 1po Once Daily 10)  Amlodipine Besylate 10 Mg Tabs (Amlodipine Besylate) .[name].. 1 By Mouth Daily 11)  Multivitamins   Tabs (Multiple Vitamin) .... Take 1 Tablet By Mouth Once A Day 12)  Ramipril 10 Mg  Caps (Ramipril) .... One By Mouth Bid 13)  Adult Aspirin Ec Low Strength 81 Mg Tbec (Aspirin) .... Take 1 Tablet By Mouth Once A Day 14)  Allopurinol 300 Mg Tabs (Allopurinol) .[name].. 1 By Mouth Once Daily 15)  Onetouch Ultra Test  Strp (Glucose Blood) .... Use Asd 1 Once Daily 16)  Vitamin D .... Take 1 Tablet By Mouth Once A Day 17)  Fish Oil   Oil (Fish Oil) .[name].. 1 Capsule Once Daily  Allergies (verified): No Known Drug Allergies   ICD Specifications Following MD:  Cristopher Peru, MD     ICD Vendor:  St Jude     ICD Model Number:  [phone]     ICD Serial Number:  [id number] ICD DOI:  12/18/2005     ICD Implanting MD:  Cristopher Peru, MD  Lead 1:    Location: RV     DOI: 12/18/2005     Model #: [id number]     Serial #: [id number]     Status: active  Indications::  NICM   ICD Follow Up Remote Check?  No Battery Voltage:  2.9 V     Charge Time:  10.6 seconds     Underlying  rhythm:  SR ICD Dependent:  No       ICD Device Measurements Right Ventricle:  Amplitude: 12 mV, Impedance: 460 ohms, Threshold: 1.0 V at 0.5 msec  Episodes Coumadin:  Yes Shock:  0     ATP:  0     Nonsustained:  0     Ventricular Pacing:  <1%  Brady Parameters Mode VVI     Lower Rate Limit:  40      Tachy Zones VF:  240     VT:  214     VT1:  188     Next Cardiology Appt Due:  02/01/2010 Tech Comments:  No parameter changes.  Device function normal.  ROV 3 months clinic. Alma Friendly, LPN  July 11, 624THL 624THL AM

## 2010-06-03 NOTE — Procedures (Signed)
Summary: device check      Allergies Added: NKDA  Current Medications (verified): 1)  Coreg 25 Mg Tabs (Carvedilol) .... Take 2 Tablets By Mouth Two Times A Day 2)  Lantus Solostar 100 Unit/ml  Soln (Insulin Glargine) .... Use Asd 10 Units Subcutaneously Once Daily 3)  Warfarin Sodium 5 Mg Tabs (Warfarin Sodium) .... Use As Directed 4)  Metformin Hcl 500 Mg  Tabs (Metformin Hcl) .... Take 1 Tablet By Mouth Two Times A Day 5)  Isosorbide Dinitrate 20 Mg Tabs (Isosorbide Dinitrate) .[name].. 1 By Mouth Three Times A Day 6)  Hydralazine Hcl 25 Mg  Tabs (Hydralazine Hcl) .... Take 1 Tablet By Mouth Three Times A Day 7)  Klor-Con M20 20 Meq  Tbcr (Potassium Chloride Crys Cr) .... 2 Tablets By Mouth Three Times A Day 8)  Lasix 80 Mg  Tabs (Furosemide) .... Take 1 Tablet By Mouth Two Times A Day 9)  Lipitor 40 Mg Tabs (Atorvastatin Calcium) .... Take One Tablet By Mouth Daily. 10)  Amlodipine Besylate 10 Mg Tabs (Amlodipine Besylate) .[name].. 1 By Mouth Daily 11)  Multivitamins   Tabs (Multiple Vitamin) .... Take 1 Tablet By Mouth Once A Day 12)  Ramipril 10 Mg  Caps (Ramipril) .... One By Mouth Bid 13)  Adult Aspirin Ec Low Strength 81 Mg Tbec (Aspirin) .... Take 1 Tablet By Mouth Once A Day 14)  Allopurinol 300 Mg Tabs (Allopurinol) .[name].. 1 By Mouth Once Daily 15)  Onetouch Ultra Test  Strp (Glucose Blood) .... Use Asd 1 Once Daily 16)  Vitamin D .... Take 1 Tablet By Mouth Once A Day 17)  Fish Oil   Oil (Fish Oil) .[name].. 1 Capsule Once Daily 18)  Diet Vitamin .[name].. 1 By Mouth Daily  Allergies (verified): No Known Drug Allergies   ICD Specifications Following MD:  Cristopher Peru, MD     ICD Vendor:  St Jude     ICD Model Number:  [phone]     ICD Serial Number:  [id number] ICD DOI:  12/18/2005     ICD Implanting MD:  Cristopher Peru, MD  Lead 1:    Location: RV     DOI: 12/18/2005     Model #: [id number]     Serial #: [id number]     Status: active  Indications::  NICM   ICD Follow Up Battery Voltage:  2.77 V      Charge Time:  10.8 seconds     Underlying rhythm:  SR ICD Dependent:  No       ICD Device Measurements Right Ventricle:  Amplitude: 12.0 mV, Impedance: 475 ohms, Threshold: 1.25 V at 0.5 msec Configuration: ` Shock Impedance: 40 ohms   Episodes MS Episodes:  0     Percent Mode Switch:  0     Coumadin:  Yes Shock:  0     ATP:  0     Nonsustained:  0     Atrial Therapies:  0 Ventricular Pacing:  <1%  Brady Parameters Mode VVI     Lower Rate Limit:  40      Tachy Zones VF:  240     VT:  214     VT1:  188     Next Cardiology Appt Due:  05/05/2010 Tech Comments:  NORMAL DEVICE FUNCTION.  NO EPISODES SINCE LAST CHECK.  NO CHANGES MADE. ROV IN 3 MTHS W/DEVICE CLINIC. Shelly Bombard  February 13, 2010 10:51 AM

## 2010-06-03 NOTE — Assessment & Plan Note (Signed)
Summary: f1y/ gd  Medications Added * DIET VITAMIN 1 by mouth daily      Allergies Added: NKDA  Visit Type:  Follow-up Primary Provider:  Dr Cathlean Cower  CC:  Cardiomyopathy.  History of Present Illness: The patient presents for followup of his cardiomyopathy. Since I last saw him he has felt the best he's felt in a while. He walks on a treadmill and walks outside. He walks to the bus to go to work. With this he denies any shortness of breath.  He has no PND or orthopnea.  He has no chest pressure, neck or arm discomfort. He has had no weight gain or edema. Unfortunately he has not had any weight loss.  Current Medications (verified): 1)  Coreg 25 Mg Tabs (Carvedilol) .... Take 2 Tablets By Mouth Two Times A Day 2)  Lantus Solostar 100 Unit/ml  Soln (Insulin Glargine) .... Use Asd 10 Units Subcutaneously Once Daily 3)  Warfarin Sodium 5 Mg Tabs (Warfarin Sodium) .... Use As Directed 4)  Metformin Hcl 500 Mg  Tabs (Metformin Hcl) .... Take 1 Tablet By Mouth Two Times A Day 5)  Isosorbide Dinitrate 20 Mg Tabs (Isosorbide Dinitrate) .[name].. 1 By Mouth Three Times A Day 6)  Hydralazine Hcl 25 Mg  Tabs (Hydralazine Hcl) .... Take 1 Tablet By Mouth Three Times A Day 7)  Klor-Con M20 20 Meq  Tbcr (Potassium Chloride Crys Cr) .... 2 Tablets By Mouth Three Times A Day 8)  Lasix 80 Mg  Tabs (Furosemide) .... Take 1 Tablet By Mouth Two Times A Day 9)  Lipitor 40 Mg Tabs (Atorvastatin Calcium) .... Take One Tablet By Mouth Daily. 10)  Amlodipine Besylate 10 Mg Tabs (Amlodipine Besylate) .[name].. 1 By Mouth Daily 11)  Multivitamins   Tabs (Multiple Vitamin) .... Take 1 Tablet By Mouth Once A Day 12)  Ramipril 10 Mg  Caps (Ramipril) .... One By Mouth Bid 13)  Adult Aspirin Ec Low Strength 81 Mg Tbec (Aspirin) .... Take 1 Tablet By Mouth Once A Day 14)  Allopurinol 300 Mg Tabs (Allopurinol) .[name].. 1 By Mouth Once Daily 15)  Onetouch Ultra Test  Strp (Glucose Blood) .... Use Asd 1 Once Daily 16)  Vitamin D  .... Take 1 Tablet By Mouth Once A Day 17)  Fish Oil   Oil (Fish Oil) .[name].. 1 Capsule Once Daily 18)  Diet Vitamin .[name].. 1 By Mouth Daily  Allergies (verified): No Known Drug Allergies  Past History:  Past Medical History: Reviewed history from 10/11/2008 and no changes required. 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) OBSTRUCTIVE SLEEP APNEA (ICD-327.23) HYPERLIPIDEMIA (ICD-272.4) CONGESTIVE HEART FAILURE (ICD-428.0) (EF 25%) CEREBROVASCULAR ACCIDENT, HX OF (ICD-V12.50) GOUT (ICD-274.9) HYPOKALEMIA (ICD-276.8) FAMILY HISTORY OF CAD MALE 1ST DEGREE RELATIVE <60 (ICD-V16.49) URI (ICD-465.9) SPECIAL SCREENING MALIGNANT NEOPLASM OF PROSTATE (ICD-V76.44) HYPERTENSION (ICD-401.9) DIABETES MELLITUS, TYPE II (ICD-250.00) DENTAL CARIES (ICD-521.00) LIPOMA (ICD-214.9) DYSLIPIDEMIA (ICD-272.4) CONGESTIVE HEART FAILURE UNSPECIFIED (ICD-428.0) ACUTE BUT ILL-DEFINED CEREBROVASCULAR DISEASE (ICD-436) GOUT, UNSPECIFIED (ICD-274.9) Nonobstructive coronary disease (catheterization in 2009 with left main calcified, LAD with luminal irregularities, circumflex with 60-70% PDA stenosis, the right coronary artery is nondominant with 60% stenosis)  Past Surgical History: Reviewed history from 12/06/2007 and no changes required. S/P ICD s/p lipoma to right back 5/09 s/p PTCA  Review of Systems       As stated in the HPI and negative for all other systems.   Vital Signs:  Patient profile:   55 year old male Height:      72 inches  Weight:      265 pounds BMI:     36.07 Pulse rate:   78 / minute Resp:     18 per minute BP sitting:   136 / 93  (right arm)  Vitals Entered By: Levora Angel, CNA (January 03, 2010 10:19 AM)  Physical Exam  General:  Well developed, well nourished, in no acute distress. Head:  normocephalic and atraumatic Eyes:  PERRLA/EOM intact; conjunctiva and lids normal. Neck:  Neck supple, no JVD. No masses, thyromegaly or abnormal cervical nodes. Chest Wall:   Well-healed ICD scar Lungs:  Clear bilaterally to auscultation and percussion. Abdomen:  Bowel sounds positive; abdomen soft and non-tender without masses, organomegaly, or hernias noted. No hepatosplenomegaly, obese Msk:  Back normal, normal gait. Muscle strength and tone normal. Extremities:  No clubbing or cyanosis. Neurologic:  Alert and oriented x 3. Skin:  Intact without lesions or rashes. Cervical Nodes:  no significant adenopathy Psych:  Normal affect.   Detailed Cardiovascular Exam  Neck    Carotids: Carotids full and equal bilaterally without bruits.      Neck Veins: Normal, no JVD.    Heart    Inspection: no deformities or lifts noted.      Palpation: normal PMI with no thrills palpable.      Auscultation: regular rate and rhythm, S1, S2 without murmurs, rubs, gallops, or clicks.    Vascular    Abdominal Aorta: no palpable masses, pulsations, or audible bruits.      Femoral Pulses: normal femoral pulses bilaterally.      Pedal Pulses: normal pedal pulses bilaterally.      Radial Pulses: normal radial pulses bilaterally.      Peripheral Circulation: no clubbing, cyanosis, or edema noted with normal capillary refill.     New Orders:     1)  EKG w/ Interpretation (93000)   EKG  Procedure date:  01/03/2010  Findings:      Sinus rhythm, rate 78, right axis deviation, common limb lead reversal, poor anterior R-wave progression, QT prolonged   ICD Specifications Following MD:  Cristopher Peru, MD     ICD Vendor:  [employer] Jude     ICD Model Number:  [phone]     ICD Serial Number:  [id number] ICD DOI:  12/18/2005     ICD Implanting MD:  Cristopher Peru, MD  Lead 1:    Location: RV     DOI: 12/18/2005     Model #: [id number]     Serial #: [id number]     Status: active  Indications::  NICM   ICD Follow Up ICD Dependent:  No      Episodes Coumadin:  Yes  Brady Parameters Mode VVI     Lower Rate Limit:  40      Tachy Zones VF:  240     VT:  214     VT1:  188     Impression &  Recommendations:  Problem # 1:  CONGESTIVE HEART FAILURE UNSPECIFIED (ICD-428.0) He is having no ongoing symptoms. No further cardiovascular testing is suggested.  Problem # 2:  CEREBROVASCULAR ACCIDENT, HX OF (ICD-V12.50) I reviewed his old records. He had an embolic stroke some years ago. He has a reduced ejection fraction of 20-25%. There is a record of atrial fibrillation. Given all of this Coumadin is indicated.  Problem # 3:  CAD (ICD-414.00) He is having ongoing chest pain. His last catheterization was in 2009. He will continue the meds as listed. Orders: EKG  w/ Interpretation (93000)  Problem # 4:  HYPERLIPIDEMIA (ICD-272.4) I will defer to Dr. Jenny Reichmann goal LDL less than 70 and HDL greater than 40.  Patient Instructions: 1)  Your physician recommends that you schedule a follow-up appointment in: 12 months with Dr Percival Spanish 2)  Your physician recommends that you continue on your current medications as directed. Please refer to the Current Medication list given to you today.

## 2010-06-03 NOTE — Cardiovascular Report (Signed)
Summary: Office Visit   Office Visit   Imported By: Sallee Provencal 07/12/2009 12:12:06  _____________________________________________________________________  External Attachment:    Type:   Image     Comment:   External Document

## 2010-06-03 NOTE — Cardiovascular Report (Signed)
Summary: Office Visit   Office Visit   Imported By: Sallee Provencal 02/20/2010 13:37:35  _____________________________________________________________________  External Attachment:    Type:   Image     Comment:   External Document

## 2010-06-03 NOTE — Medication Information (Signed)
Summary: rov/tm  Anticoagulant Therapy  Managed by: Gwynneth Albright, PharmD PCP: Dr Cathlean Cower Supervising MD: Angelena Form MD, Harrell Gave Indication 1: Atrial Fibrillation (ICD-427.31) Lab Used: [facility] Site: [facility] INR POC 2.2 INR RANGE 2 - 3  Dietary changes: no    Health status changes: no    Bleeding/hemorrhagic complications: no    Recent/future hospitalizations: no    Any changes in medication regimen? no    Recent/future dental: no  Any missed doses?: no       Is patient compliant with meds? yes       Allergies: No Known Drug Allergies  Anticoagulation Management History:      The patient is taking warfarin and comes in today for a routine follow up visit.  Positive risk factors for bleeding include presence of serious comorbidities.  Negative risk factors for bleeding include an age less than 70 years old.  The bleeding index is 'intermediate risk'.  Positive CHADS2 values include History of CHF, History of HTN, and History of Diabetes.  Negative CHADS2 values include Age > 24 years old.  The start date was 05/10/2008.  His last INR was 1.4.  Anticoagulation responsible provider: Angelena Form MD, Harrell Gave.  INR POC: 2.2.  Cuvette Lot#: [id number].  Exp: 12/2010.    Anticoagulation Management Assessment/Plan:      The patient's current anticoagulation dose is Warfarin sodium 5 mg tabs: use as directed.  The target INR is 2 - 3.  The next INR is due 11/08/2009.  Anticoagulation instructions were given to patient.  Results were reviewed/authorized by Gwynneth Albright, PharmD.  He was notified by Gwynneth Albright, PharmD.         Prior Anticoagulation Instructions: INR 2.5 Continue 15mg s daily except 10mg s on Thursdays. Recheck in 4 weeks.   Current Anticoagulation Instructions: INR 2.2. Take 15 mg daily except 10 mg on Thurs.  Recheck in 4 weeks.

## 2010-06-03 NOTE — Medication Information (Signed)
Summary: rov/ez  Anticoagulant Therapy  Managed by: Freddrick March, RN, BSN PCP: Dr Cathlean Cower Supervising MD: Percival Spanish MD, Jeneen Rinks Indication 1: Atrial Fibrillation (ICD-427.31) Lab Used: [place] Site: [facility] INR POC 2.0 INR RANGE 2 - 3  Dietary changes: no    Health status changes: no    Bleeding/hemorrhagic complications: no    Recent/future hospitalizations: no    Any changes in medication regimen? no    Recent/future dental: no  Any missed doses?: yes     Details: Pt thinks he may have missed 1 dosage last week.    Is patient compliant with meds? yes      Comments: Saw Dr Jenny Reichmann 06/17/09, going to change pt to Lipitor, but hasn't started yet.  Will let us know when he changes.    Allergies (verified): No Known Drug Allergies  Anticoagulation Management History:      The patient is taking warfarin and comes in today for a routine follow up visit.  Positive risk factors for bleeding include presence of serious comorbidities.  Negative risk factors for bleeding include an age less than 69 years old.  The bleeding index is 'intermediate risk'.  Positive CHADS2 values include History of CHF, History of HTN, and History of Diabetes.  Negative CHADS2 values include Age > 63 years old.  The start date was 05/10/2008.  His last INR was 1.4.  Anticoagulation responsible provider: Percival Spanish MD, Jeneen Rinks.  INR POC: 2.0.  Cuvette Lot#: [id number].  Exp: 08/2010.    Anticoagulation Management Assessment/Plan:      The patient's current anticoagulation dose is Warfarin sodium 5 mg tabs: use as directed.  The target INR is 2 - 3.  The next INR is due 07/26/2009.  Anticoagulation instructions were given to patient.  Results were reviewed/authorized by Freddrick March, RN, BSN.  He was notified by Freddrick March RN.         Prior Anticoagulation Instructions: INR 2.0  Take an extra 5mg  today then continue current dose of 15mg  daily except 10mg  on Thursdays. Recheck in 4 weeks.  Current  Anticoagulation Instructions: INR 2.0  Take 15mg  today then resume same dosage 15mg  daily except 10mg  on Thursdays.  Recheck in 4 weeks.

## 2010-06-03 NOTE — Progress Notes (Signed)
Summary: Metformin refill  Phone Note Refill Request Message from:  Fax from Pharmacy on May 17, 2009 8:38 AM  Refills Requested: Medication #1:  METFORMIN HCL 500 MG  TABS Take 1 tablet by mouth two times a day   Dosage confirmed as above?Dosage Confirmed   Supply Requested: 9 months Request for refill Authorization [facility]  Initial call taken by: Otho Ket,  May 17, 2009 8:46 AM  Follow-up for Phone Call        refilled. Follow-up by: Ernestene Mention,  May 17, 2009 10:34 AM    Prescriptions: METFORMIN HCL 500 MG  TABS (METFORMIN HCL) Take 1 tablet by mouth two times a day  #60 Tablet x 3   Entered by:   Ernestene Mention   Authorized by:   Biagio Borg MD   Signed by:   Ernestene Mention on 05/17/2009   Method used:   [facility]        [facility] [address] [phone]* (retail)       [address]       [place], [place]  [address]       Ph: [phone] or [phone]       Fax: [phone]   RxID:   [id number]

## 2010-06-03 NOTE — Medication Information (Signed)
Summary: rov/ewj  Anticoagulant Therapy  Managed by: Freddrick March, RN, BSN PCP: Dr Cathlean Cower Supervising MD: Johnsie Cancel MD, Collier Salina Indication 1: Atrial Fibrillation (ICD-427.31) Lab Used: [place] Site: [facility] INR POC 3.2 INR RANGE 2 - 3  Dietary changes: no    Health status changes: no    Bleeding/hemorrhagic complications: no    Recent/future hospitalizations: no    Any changes in medication regimen? no    Recent/future dental: no  Any missed doses?: no       Is patient compliant with meds? yes       Allergies: No Known Drug Allergies  Anticoagulation Management History:      The patient is taking warfarin and comes in today for a routine follow up visit.  Positive risk factors for bleeding include presence of serious comorbidities.  Negative risk factors for bleeding include an age less than 10 years old.  The bleeding index is 'intermediate risk'.  Positive CHADS2 values include History of CHF, History of HTN, and History of Diabetes.  Negative CHADS2 values include Age > 57 years old.  The start date was 05/10/2008.  His last INR was 1.4.  Anticoagulation responsible provider: Johnsie Cancel MD, Collier Salina.  INR POC: 3.2.  Cuvette Lot#: [id number].  Exp: 02/2011.    Anticoagulation Management Assessment/Plan:      The patient's current anticoagulation dose is Warfarin sodium 5 mg tabs: use as directed.  The target INR is 2 - 3.  The next INR is due 01/03/2010.  Anticoagulation instructions were given to patient.  Results were reviewed/authorized by Freddrick March, RN, BSN.  He was notified by Freddrick March RN.         Prior Anticoagulation Instructions: INR 2.2 Continue 15mg s daily except 10mg s on Thursdays. Recheck in 4 weeks.  Current Anticoagulation Instructions: INR 3.2  Take 5mg  today, then resume same dosage 15mg  daily except 10mg  on Thursdays.  Recheck in 4 weeks.

## 2010-06-03 NOTE — Letter (Signed)
Summary: [facility] [employer]/[facility]  [facility]   Imported By: Phillis Knack 07/24/2009 09:58:03  _____________________________________________________________________  External Attachment:    Type:   Image     Comment:   External Document

## 2010-06-03 NOTE — Medication Information (Signed)
Summary: rov/tp   Anticoagulant Therapy  Managed by: Vanessa Kamrar, PharmD PCP: Dr Cathlean Cower Supervising MD: Harrington Challenger MD, Nevin Bloodgood Indication 1: Atrial Fibrillation (ICD-427.31) Lab Used: [facility] Site: [employer] INR RANGE 2 - 3  Dietary changes: no    Health status changes: no    Bleeding/hemorrhagic complications: no    Recent/future hospitalizations: no    Any changes in medication regimen? no    Recent/future dental: no  Any missed doses?: no       Is patient compliant with meds? yes       Allergies: No Known Drug Allergies  Anticoagulation Management History:      Positive risk factors for bleeding include presence of serious comorbidities.  Negative risk factors for bleeding include an age less than 55 years old.  The bleeding index is 'intermediate risk'.  Positive CHADS2 values include History of CHF, History of HTN, and History of Diabetes.  Negative CHADS2 values include Age > 55 years old.  The start date was 05/10/2008.  His last INR was 1.4.  Anticoagulation responsible Osker Ayoub: Harrington Challenger MD, Nevin Bloodgood.  Exp: 03/2011.    Anticoagulation Management Assessment/Plan:      The patient's current anticoagulation dose is Warfarin sodium 5 mg tabs: use as directed.  The target INR is 2 - 3.  The next INR is due 05/06/2010.  Anticoagulation instructions were given to patient.  Results were reviewed/authorized by Vanessa Mauston, PharmD.         Prior Anticoagulation Instructions: INR 2.9  Continue taking Coumadin 3 tabs (15 mg) every day.  Return to clinic in 4 weeks.   Current Anticoagulation Instructions: INR: 1.7  Your INR is low today.  Please take 4 tablets (20 mg) tonight then continue with your normal schedule of 3 tablets (15 mg) daily.    Please return to clinic in 3 weeks

## 2010-06-03 NOTE — Assessment & Plan Note (Signed)
Summary: 6 mos f/u $50 /cd   Vital Signs:  Patient profile:   55 year old male Height:      72 inches Weight:      268 pounds BMI:     36.48 O2 Sat:      98 % on Room air Temp:     98 degrees F oral Pulse rate:   74 / minute BP sitting:   118 / 90  (left arm) Cuff size:   large  Vitals Entered ByShirlean Mylar Ewing (June 17, 2009 9:42 AM)  O2 Flow:  Room air  CC: 6 Mo Followup/RE   Primary Care Provider:  Dr Cathlean Cower  CC:  6 Mo Followup/RE.  History of Present Illness: overal donig well, no complaints, good med tolerability, good complaince;  Pt denies CP, sob, doe, wheezing, orthopnea, pnd, worsening LE edema, palps, dizziness or syncope   Pt denies new neuro symptoms such as headache, facial or extremity weakness  Pt denies polydipsia, polyuria, or low sugar symptoms such as shakiness improved with eating.  Overall good compliance with meds, trying to follow low chol, DM diet, wt stable, little excercise however   Has some mild ongoing fatigue but no OSA symptoms.  no other new compalints Here for wellness Diet: Heart Healthy or DM if diabetic Physical Activities: Sedentary Depression/mood screen: Negative Hearing: Intact bilateral Visual Acuity: Grossly normal ADL's: Capable  Fall Risk: None Home Safety: Good End-of-Life Planning: Advance directive - Full code/I agree   Problems Prior to Update: 1)  Fatigue  (ICD-780.79) 2)  Preventive Health Care  (ICD-V70.0) 3)  Fever Unspecified  (ICD-780.60) 4)  Cad  (ICD-414.00) 5)  Atrial Fibrillation  (ICD-427.31) 6)  Paroxysmal Ventricular Tachycardia  (ICD-427.1) 7)  Acute Gouty Arthropathy  (ICD-274.01) 8)  Obstructive Sleep Apnea  (ICD-327.23) 9)  Hyperlipidemia  (ICD-272.4) 10)  Congestive Heart Failure  (ICD-428.0) 11)  Cerebrovascular Accident, Hx of  (ICD-V12.50) 12)  Gout  (ICD-274.9) 13)  Hypokalemia  (ICD-276.8) 14)  Family History of Cad Male 1st Degree Relative <60  (ICD-V16.49) 15)  Uri  (ICD-465.9) 16)   Special Screening Malignant Neoplasm of Prostate  (ICD-V76.44) 17)  Hypertension  (ICD-401.9) 18)  Diabetes Mellitus, Type II  (ICD-250.00) 19)  Dental Caries  (ICD-521.00) 20)  Lipoma  (ICD-214.9) 21)  Dyslipidemia  (ICD-272.4) 22)  Congestive Heart Failure Unspecified  (ICD-428.0) 23)  Acute But Ill-defined Cerebrovascular Disease  (ICD-436) 24)  Gout, Unspecified  (ICD-274.9)  Medications Prior to Update: 1)  Coreg 25 Mg Tabs (Carvedilol) .... Take 2 Tablets By Mouth Two Times A Day 2)  Lantus Solostar 100 Unit/ml  Soln (Insulin Glargine) .... Use Asd 10 Units Subcutaneously Once Daily 3)  Warfarin Sodium 5 Mg Tabs (Warfarin Sodium) .... Use As Directed 4)  Metformin Hcl 500 Mg  Tabs (Metformin Hcl) .... Take 1 Tablet By Mouth Two Times A Day 5)  Isosorbide Dinitrate 20 Mg Tabs (Isosorbide Dinitrate) .[name].. 1 By Mouth Three Times A Day 6)  Hydralazine Hcl 25 Mg  Tabs (Hydralazine Hcl) .... Take 1 Tablet By Mouth Three Times A Day 7)  Klor-Con M20 20 Meq  Tbcr (Potassium Chloride Crys Cr) .... 2 Tablets By Mouth Three Times A Day 8)  Lasix 80 Mg  Tabs (Furosemide) .... Take 1 Tablet By Mouth Two Times A Day 9)  Simvastatin 80 Mg Tabs (Simvastatin) .[name].. 1po Once Daily 10)  Amlodipine Besylate 10 Mg Tabs (Amlodipine Besylate) .[name].. 1 By Mouth Daily 11)  Multivitamins  Tabs (Multiple Vitamin) .... Take 1 Tablet By Mouth Once A Day 12)  Ramipril 10 Mg  Caps (Ramipril) .... One By Mouth Bid 13)  Adult Aspirin Ec Low Strength 81 Mg Tbec (Aspirin) .... Take 1 Tablet By Mouth Once A Day 14)  Allopurinol 300 Mg Tabs (Allopurinol) .[name].. 1 By Mouth Once Daily 15)  Onetouch Ultra Test  Strp (Glucose Blood) .... Use Asd 1 Once Daily 16)  Vitamin D .... Take 1 Tablet By Mouth Once A Day  Current Medications (verified): 1)  Coreg 25 Mg Tabs (Carvedilol) .... Take 2 Tablets By Mouth Two Times A Day 2)  Lantus Solostar 100 Unit/ml  Soln (Insulin Glargine) .... Use Asd 10 Units Subcutaneously Once  Daily 3)  Warfarin Sodium 5 Mg Tabs (Warfarin Sodium) .... Use As Directed 4)  Metformin Hcl 500 Mg  Tabs (Metformin Hcl) .... Take 1 Tablet By Mouth Two Times A Day 5)  Isosorbide Dinitrate 20 Mg Tabs (Isosorbide Dinitrate) .[name].. 1 By Mouth Three Times A Day 6)  Hydralazine Hcl 25 Mg  Tabs (Hydralazine Hcl) .... Take 1 Tablet By Mouth Three Times A Day 7)  Klor-Con M20 20 Meq  Tbcr (Potassium Chloride Crys Cr) .... 2 Tablets By Mouth Three Times A Day 8)  Lasix 80 Mg  Tabs (Furosemide) .... Take 1 Tablet By Mouth Two Times A Day 9)  Simvastatin 80 Mg Tabs (Simvastatin) .[name].. 1po Once Daily 10)  Amlodipine Besylate 10 Mg Tabs (Amlodipine Besylate) .[name].. 1 By Mouth Daily 11)  Multivitamins   Tabs (Multiple Vitamin) .... Take 1 Tablet By Mouth Once A Day 12)  Ramipril 10 Mg  Caps (Ramipril) .... One By Mouth Bid 13)  Adult Aspirin Ec Low Strength 81 Mg Tbec (Aspirin) .... Take 1 Tablet By Mouth Once A Day 14)  Allopurinol 300 Mg Tabs (Allopurinol) .[name].. 1 By Mouth Once Daily 15)  Onetouch Ultra Test  Strp (Glucose Blood) .... Use Asd 1 Once Daily 16)  Vitamin D .... Take 1 Tablet By Mouth Once A Day  Allergies (verified): No Known Drug Allergies  Past History:  Past Medical History: Last updated: 10/11/2008 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) OBSTRUCTIVE SLEEP APNEA (ICD-327.23) HYPERLIPIDEMIA (ICD-272.4) CONGESTIVE HEART FAILURE (ICD-428.0) (EF 25%) CEREBROVASCULAR ACCIDENT, HX OF (ICD-V12.50) GOUT (ICD-274.9) HYPOKALEMIA (ICD-276.8) FAMILY HISTORY OF CAD MALE 1ST DEGREE RELATIVE <60 (ICD-V16.49) URI (ICD-465.9) SPECIAL SCREENING MALIGNANT NEOPLASM OF PROSTATE (ICD-V76.44) HYPERTENSION (ICD-401.9) DIABETES MELLITUS, TYPE II (ICD-250.00) DENTAL CARIES (ICD-521.00) LIPOMA (ICD-214.9) DYSLIPIDEMIA (ICD-272.4) CONGESTIVE HEART FAILURE UNSPECIFIED (ICD-428.0) ACUTE BUT ILL-DEFINED CEREBROVASCULAR DISEASE (ICD-436) GOUT, UNSPECIFIED (ICD-274.9) Nonobstructive coronary disease  (catheterization in 2009 with left main calcified, LAD with luminal irregularities, circumflex with 60-70% PDA stenosis, the right coronary artery is nondominant with 60% stenosis)  Past Surgical History: Last updated: 12/06/2007 S/P ICD s/p lipoma to right back 5/09 s/p PTCA  Family History: Last updated: 12/06/2007 Family History of CAD Male 1st degree relative - mother and grandmother sister thyroid problem (not cancer) mother with hx of cva, HTN, ETOH dependence - deceased age 56 yo  Social History: Last updated: 06/11/2008 Occupation:    Works as a custodian Married with two children Alcohol use-yes - very occassionally Former Smoker Quit smoking 30 yrs ago.   Smoked as teenager less than 1/2 ppd.  Smoked x 4 years.  Risk Factors: Smoking Status: quit (05/09/2007)  Review of Systems  The patient denies anorexia, fever, weight loss, weight gain, vision loss, decreased hearing, hoarseness, chest pain, syncope, dyspnea on exertion, peripheral edema,  prolonged cough, headaches, hemoptysis, abdominal pain, melena, hematochezia, severe indigestion/heartburn, hematuria, incontinence, muscle weakness, suspicious skin lesions, transient blindness, difficulty walking, depression, unusual weight change, abnormal bleeding, enlarged lymph nodes, and angioedema.         all otherwise negative per pt -   Physical Exam  General:  alert and overweight-appearing.   Head:  normocephalic and atraumatic.   Eyes:  vision grossly intact, pupils equal, and pupils round.   Ears:  R ear normal and L ear normal.   Nose:  no external deformity and no nasal discharge.   Mouth:  no gingival abnormalities and pharynx pink and moist.   Neck:  supple and no masses.   Lungs:  normal respiratory effort and normal breath sounds.   Heart:  normal rate and regular rhythm.   Abdomen:  soft, non-tender, and normal bowel sounds.   Msk:  no joint tenderness and no joint swelling.   Extremities:  no edema,  no erythema  Neurologic:  cranial nerves II-XII intact and strength normal in all extremities.   Skin:  color normal and no rashes.   Psych:  not anxious appearing and not depressed appearing.     Impression & Recommendations:  Problem # 1:  Preventive Health Care (ICD-V70.0)  Overall doing well, age appropriate education and counseling updated and referral for appropriate preventive services done unless declined, immunizations up to date or declined, diet counseling done if overweight, urged to quit smoking if smokes , most recent labs reviewed and current ordered if appropriate, ecg reviewed or declined (interpretation per ECG scanned in the EMR if done); information regarding Medicare Prevention requirements given if appropriate   Orders: First annual wellness visit with prevention plan  [phone]) Prescription Created Electronically [phone])  Problem # 2:  FATIGUE (ICD-780.79) exam benign, to check labs below; follow with expectant management  Orders: T-Vitamin D (25-Hydroxy) [id number]) TLB-BMP (Basic Metabolic Panel-BMET) (99991111) TLB-B12 + Folate Pnl [id number]) TLB-IBC Pnl (Iron/FE;Transferrin) (83550-IBC) TLB-CBC Platelet - w/Differential (85025-CBCD) TLB-Sedimentation Rate (ESR) (85652-ESR) TLB-TSH (Thyroid Stimulating Hormone) (84443-TSH) TLB-Udip ONLY (81003-UDIP)  Problem # 3:  GOUT (ICD-274.9)  His updated medication list for this problem includes:    Allopurinol 300 Mg Tabs (Allopurinol) .[name]... 1 by mouth once daily treat as above, f/u any worsening signs or symptoms , stable overall by hx and exam, ok to continue meds/tx as is   Problem # 4:  HYPERLIPIDEMIA (ICD-272.4)  His updated medication list for this problem includes:    Simvastatin 80 Mg Tabs (Simvastatin) .[name]... 1po once daily  Labs Reviewed: SGOT: 20 (07/12/2008)   SGPT: 19 (07/12/2008)   HDL:35.90 (12/10/2008), 33.3 (07/12/2008)  LDL:150 (12/10/2008), 95 (07/12/2008)  Chol:198  (12/10/2008), 137 (07/12/2008)  Trig:60.0 (12/10/2008), 43 (07/12/2008) stable overall by hx and exam, ok to continue meds/tx as is , Pt to continue diet efforts, good med tolerance; to check labs - goal LDL less than 70  Problem # 5:  HYPERTENSION (ICD-401.9)  His updated medication list for this problem includes:    Coreg 25 Mg Tabs (Carvedilol) .[name]... Take 2 tablets by mouth two times a day    Hydralazine Hcl 25 Mg Tabs (Hydralazine hcl) .[name]... Take 1 tablet by mouth three times a day    Lasix 80 Mg Tabs (Furosemide) .[name]... Take 1 tablet by mouth two times a day    Amlodipine Besylate 10 Mg Tabs (Amlodipine besylate) .[name]... 1 by mouth daily    Ramipril 10 Mg Caps (Ramipril) ..... One by mouth bid  BP today:  118/90 Prior BP: 118/82 (01/10/2009)  Labs Reviewed: K+: 3.6 (12/10/2008) Creat: : 1.0 (12/10/2008)   Chol: 198 (12/10/2008)   HDL: 35.90 (12/10/2008)   LDL: 150 (12/10/2008)   TG: 60.0 (12/10/2008) stable overall by hx and exam, ok to continue meds/tx as is   Problem # 6:  DIABETES MELLITUS, TYPE II (ICD-250.00)  His updated medication list for this problem includes:    Lantus Solostar 100 Unit/ml Soln (Insulin glargine) ..... Use asd 10 units subcutaneously once daily    Metformin Hcl 500 Mg Tabs (Metformin hcl) .[name]... Take 1 tablet by mouth two times a day    Ramipril 10 Mg Caps (Ramipril) ..... One by mouth bid    Adult Aspirin Ec Low Strength 81 Mg Tbec (Aspirin) .[name]... Take 1 tablet by mouth once a day  Orders: TLB-A1C / Hgb A1C (Glycohemoglobin) (83036-A1C) TLB-Microalbumin/Creat Ratio, Urine (82043-MALB)  Labs Reviewed: Creat: 1.0 (12/10/2008)    Reviewed HgBA1c results: 5.5 (12/10/2008)  5.8 (07/12/2008) stable overall by hx and exam, ok to continue meds/tx as is   Complete Medication List: 1)  Coreg 25 Mg Tabs (Carvedilol) .... Take 2 tablets by mouth two times a day 2)  Lantus Solostar 100 Unit/ml Soln (Insulin glargine) .... Use asd 10 units subcutaneously once  daily 3)  Warfarin Sodium 5 Mg Tabs (Warfarin sodium) .... Use as directed 4)  Metformin Hcl 500 Mg Tabs (Metformin hcl) .... Take 1 tablet by mouth two times a day 5)  Isosorbide Dinitrate 20 Mg Tabs (Isosorbide dinitrate) .[name].. 1 by mouth three times a day 6)  Hydralazine Hcl 25 Mg Tabs (Hydralazine hcl) .... Take 1 tablet by mouth three times a day 7)  Klor-con M20 20 Meq Tbcr (Potassium chloride crys cr) .... 2 tablets by mouth three times a day 8)  Lasix 80 Mg Tabs (Furosemide) .... Take 1 tablet by mouth two times a day 9)  Simvastatin 80 Mg Tabs (Simvastatin) .[name].. 1po once daily 10)  Amlodipine Besylate 10 Mg Tabs (Amlodipine besylate) .[name].. 1 by mouth daily 11)  Multivitamins Tabs (Multiple vitamin) .... Take 1 tablet by mouth once a day 12)  Ramipril 10 Mg Caps (Ramipril) .... One by mouth bid 13)  Adult Aspirin Ec Low Strength 81 Mg Tbec (Aspirin) .... Take 1 tablet by mouth once a day 14)  Allopurinol 300 Mg Tabs (Allopurinol) .[name].. 1 by mouth once daily 15)  Onetouch Ultra Test Strp (Glucose blood) .... Use asd 1 once daily 16)  Vitamin D  .... Take 1 tablet by mouth once a day  Other Orders: TLB-PSA (Prostate Specific Antigen) (84153-PSA) TLB-BNP (B-Natriuretic Peptide) (83880-BNPR) TLB-Uric Acid, Blood (84550-URIC) TLB-Lipid Panel (80061-LIPID)  Patient Instructions: 1)  Please go to the Lab in the basement for your blood and/or urine tests today 2)  Continue all previous medications as before this visit  3)  Please schedule a follow-up appointment in 6 months.

## 2010-06-03 NOTE — Cardiovascular Report (Signed)
Summary: Office Visit   Office Visit   Imported By: Sallee Provencal 11/22/2009 09:49:46  _____________________________________________________________________  External Attachment:    Type:   Image     Comment:   External Document

## 2010-06-03 NOTE — Progress Notes (Signed)
  Phone Note Refill Request Message from:  Fax from Pharmacy on March 17, 2010 2:51 PM  Refills Requested: Medication #1:  LANTUS SOLOSTAR 100 UNIT/ML  SOLN use asd 10 units subcutaneously once daily   Dosage confirmed as above?Dosage Confirmed   Notes: [facility] Initial call taken by: Shirlean Mylar Ewing CMA Deborra Medina),  March 17, 2010 2:52 PM    Prescriptions: LANTUS SOLOSTAR 100 UNIT/ML  SOLN (INSULIN GLARGINE) use asd 10 units subcutaneously once daily  #5 pens x 11   Entered by:   Shirlean Mylar Ewing CMA ([place])   Authorized by:   Biagio Borg MD   Signed by:   Sharon Seller CMA ([place]) on 03/17/2010   Method used:   [facility]ed to ...       [facility] [address] [address] [phone]* (retail)       [address]       [place], [place]  [address]       Ph: [phone] or [phone]       Fax: [phone]   RxID:   [id number]

## 2010-06-03 NOTE — Medication Information (Signed)
Summary: rov/sp  Anticoagulant Therapy  Managed by: Freddrick March, RN, BSN PCP: Dr Cathlean Cower Supervising MD: Lovena Le MD, Carleene Overlie Indication 1: Atrial Fibrillation (ICD-427.31) Lab Used: [place] Site: [facility] INR POC 1.9 INR RANGE 2 - 3  Dietary changes: no    Health status changes: no    Bleeding/hemorrhagic complications: no    Recent/future hospitalizations: no    Any changes in medication regimen? no    Recent/future dental: no  Any missed doses?: no       Is patient compliant with meds? yes       Allergies: No Known Drug Allergies  Anticoagulation Management History:      The patient is taking warfarin and comes in today for a routine follow up visit.  Positive risk factors for bleeding include presence of serious comorbidities.  Negative risk factors for bleeding include an age less than 72 years old.  The bleeding index is 'intermediate risk'.  Positive CHADS2 values include History of CHF, History of HTN, and History of Diabetes.  Negative CHADS2 values include Age > 54 years old.  The start date was 05/10/2008.  His last INR was 1.4.  Anticoagulation responsible provider: Lovena Le MD, Carleene Overlie.  INR POC: 1.9.  Cuvette Lot#: [id number].  Exp: 03/2011.    Anticoagulation Management Assessment/Plan:      The patient's current anticoagulation dose is Warfarin sodium 5 mg tabs: use as directed.  The target INR is 2 - 3.  The next INR is due 03/06/2010.  Anticoagulation instructions were given to patient.  Results were reviewed/authorized by Freddrick March, RN, BSN.  He was notified by Freddrick March RN.         Prior Anticoagulation Instructions: INR 1.8  Take extra 5mg  today then resume same dose of 3 tablets every day except 2 tablets on Thursday.  Recheck INR in 3 weeks.   Current Anticoagulation Instructions: INR 1.9  Start taking 3 tablets daily.  Recheck in 3 weeks.

## 2010-06-03 NOTE — Medication Information (Signed)
Summary: rov.m  Anticoagulant Therapy  Managed by: Alinda Deem, PharmD, BCPS, CPP PCP: Dr Cathlean Cower Supervising MD: Caryl Comes MD, Remo Lipps Indication 1: Atrial Fibrillation (ICD-427.31) Lab Used: [place] Site: [facility] INR POC 2.0 INR RANGE 2 - 3  Dietary changes: no    Health status changes: no    Bleeding/hemorrhagic complications: no    Recent/future hospitalizations: no    Any changes in medication regimen? no    Recent/future dental: no  Any missed doses?: yes     Details: May have missed one last week but unsure of exact day.  Is patient compliant with meds? yes       Allergies: No Known Drug Allergies  Anticoagulation Management History:      The patient is taking warfarin and comes in today for a routine follow up visit.  Positive risk factors for bleeding include presence of serious comorbidities.  Negative risk factors for bleeding include an age less than 56 years old.  The bleeding index is 'intermediate risk'.  Positive CHADS2 values include History of CHF, History of HTN, and History of Diabetes.  Negative CHADS2 values include Age > 25 years old.  The start date was 05/10/2008.  His last INR was 1.4.  Anticoagulation responsible provider: Caryl Comes MD, Remo Lipps.  INR POC: 2.0.  Cuvette Lot#: [id number].  Exp: 08/2010.    Anticoagulation Management Assessment/Plan:      The patient's current anticoagulation dose is Warfarin sodium 5 mg tabs: use as directed.  The target INR is 2 - 3.  The next INR is due 06/27/2009.  Anticoagulation instructions were given to patient.  Results were reviewed/authorized by Alinda Deem, PharmD, BCPS, CPP.  He was notified by Belenda Cruise, PharmD Candidate.         Prior Anticoagulation Instructions: INR 2.3  Continue 3 tabs daily except 2 tabs each Thursday.  Recheck in 4 weeks.    Current Anticoagulation Instructions: INR 2.0  Take an extra 5mg  today then continue current dose of 15mg  daily except 10mg  on Thursdays. Recheck in 4  weeks.

## 2010-06-03 NOTE — Cardiovascular Report (Signed)
Summary: Office Visit   Office Visit   Imported By: Sallee Provencal 08/20/2009 15:43:51  _____________________________________________________________________  External Attachment:    Type:   Image     Comment:   External Document

## 2010-06-03 NOTE — Medication Information (Signed)
Summary: rov/ewj  Anticoagulant Therapy  Managed by: Gwynneth Albright, PharmD PCP: Dr Cathlean Cower Supervising MD: Johnsie Cancel MD, Collier Salina Indication 1: Atrial Fibrillation (ICD-427.31) Lab Used: [facility] Site: [facility] INR RANGE 2 - 3  Dietary changes: no    Health status changes: no    Bleeding/hemorrhagic complications: no    Recent/future hospitalizations: no    Any changes in medication regimen? no    Recent/future dental: no  Any missed doses?: no       Is patient compliant with meds? yes       Allergies: No Known Drug Allergies  Anticoagulation Management History:      The patient is taking warfarin and comes in today for a routine follow up visit.  Positive risk factors for bleeding include presence of serious comorbidities.  Negative risk factors for bleeding include an age less than 45 years old.  The bleeding index is 'intermediate risk'.  Positive CHADS2 values include History of CHF, History of HTN, and History of Diabetes.  Negative CHADS2 values include Age > 67 years old.  The start date was 05/10/2008.  His last INR was 1.4.  Anticoagulation responsible provider: Johnsie Cancel MD, Collier Salina.  Cuvette Lot#: [id number].  Exp: 09/2010.    Anticoagulation Management Assessment/Plan:      The patient's current anticoagulation dose is Warfarin sodium 5 mg tabs: use as directed.  The target INR is 2 - 3.  The next INR is due 09/12/2009.  Anticoagulation instructions were given to patient.  Results were reviewed/authorized by Gwynneth Albright, PharmD.  He was notified by Gwynneth Albright, PharmD.         Prior Anticoagulation Instructions: INR 2.9  Continue on same dosage 3 tablets daily except 2 tablets on Thursdays.  Recheck in 4 weeks.    Current Anticoagulation Instructions: INR 2.9. Take 3 tablets daily except 2 tablets on Thursday.

## 2010-06-03 NOTE — Medication Information (Signed)
Summary: [name]   Anticoagulant Therapy  Managed by: [name], PharmD PCP: Dr Cathlean Cower Supervising MD: Olevia Perches MD, Izel Hochberg Indication 1: Atrial Fibrillation (ICD-427.31) Lab Used: [facility] Site: [facility] INR POC 1.8 INR RANGE 2 - 3  Dietary changes: no    Health status changes: no    Bleeding/hemorrhagic complications: no    Recent/future hospitalizations: no    Any changes in medication regimen? no    Recent/future dental: no  Any missed doses?: yes     Details: may have missed 1 dose last week  Is patient compliant with meds? yes       Allergies: No Known Drug Allergies  Anticoagulation Management History:      The patient is taking warfarin and comes in today for a routine follow up visit.  Positive risk factors for bleeding include presence of serious comorbidities.  Negative risk factors for bleeding include an age less than 18 years old.  The bleeding index is 'intermediate risk'.  Positive CHADS2 values include History of CHF, History of HTN, and History of Diabetes.  Negative CHADS2 values include Age > 31 years old.  The start date was 05/10/2008.  His last INR was 1.4.  Anticoagulation responsible provider: Olevia Perches MD, Darnell Level.  INR POC: 1.8.  Cuvette Lot#: [id number].  Exp: 03/2011.    Anticoagulation Management Assessment/Plan:      The patient's current anticoagulation dose is Warfarin sodium 5 mg tabs: use as directed.  The target INR is 2 - 3.  The next INR is due 02/21/2010.  Anticoagulation instructions were given to patient.  Results were reviewed/authorized by [name], PharmD.  He was notified by [name] PharmD.         Prior Anticoagulation Instructions: INR 1.8  Take an extra 5mg  tablet today, then resume same dosage 3 tablets daily except 2 tablets on Thursdays.  Recheck in 3-4 weeks.      Current Anticoagulation Instructions: INR 1.8  Take extra 5mg  today then resume same dose of 3 tablets every day except 2 tablets on Thursday.   Recheck INR in 3 weeks.  Prescriptions: WARFARIN SODIUM 5 MG TABS (WARFARIN SODIUM) use as directed  #90.0 Tablet x 2   Entered by:   [name] PharmD   Authorized by:   Minus Breeding, MD, [facility]   Signed by:   [name] PharmD on 01/31/2010   Method used:   Electronically to        [facility] [address] [phone]* (retail)       [address]       [place],   [address]       Ph: [phone] or [phone]       Fax: [phone]   RxID:   [id number]

## 2010-06-03 NOTE — Assessment & Plan Note (Signed)
Summary: rov for osa   Copy to:  Dr Percival Spanish Primary Provider/Referring Provider:  Dr Cathlean Cower  CC:  1 year follow up, Pt states uses cpapc 7/7 nights, 6-7 hrs a night, Pt states he has no problems with cpap, and Pt states on get's up once a night and that is to use BR. [name]  History of Present Illness: The pt comes in today for f/u of his known osa.  He has been wearing cpap compliantly, with no mask or pressure issues.  Despite this, he feels that he is not quite as rested as he has been in the past.  His weight is up about 8 pounds since the last visit.  He denies any issues with mask leaks, and has kept up with his cpap supplies.  Preventive Screening-Counseling & Management  Alcohol-Tobacco     Smoking Status: quit     Packs/Day: <0.25     Year Started: 1970     Year Quit: 1985  Current Medications (verified): 1)  Coreg 25 Mg Tabs (Carvedilol) .... Take 2 Tablets By Mouth Two Times A Day 2)  Lantus Solostar 100 Unit/ml  Soln (Insulin Glargine) .... Use Asd 10 Units Subcutaneously Once Daily 3)  Warfarin Sodium 5 Mg Tabs (Warfarin Sodium) .... Use As Directed 4)  Metformin Hcl 500 Mg  Tabs (Metformin Hcl) .... Take 1 Tablet By Mouth Two Times A Day 5)  Isosorbide Dinitrate 20 Mg Tabs (Isosorbide Dinitrate) .[name].. 1 By Mouth Three Times A Day 6)  Hydralazine Hcl 25 Mg  Tabs (Hydralazine Hcl) .... Take 1 Tablet By Mouth Three Times A Day 7)  Klor-Con M20 20 Meq  Tbcr (Potassium Chloride Crys Cr) .... 2 Tablets By Mouth Three Times A Day 8)  Lasix 80 Mg  Tabs (Furosemide) .... Take 1 Tablet By Mouth Two Times A Day 9)  Lipitor 40 Mg Tabs (Atorvastatin Calcium) .... Take One Tablet By Mouth Daily. 10)  Amlodipine Besylate 10 Mg Tabs (Amlodipine Besylate) .[name].. 1 By Mouth Daily 11)  Multivitamins   Tabs (Multiple Vitamin) .... Take 1 Tablet By Mouth Once A Day 12)  Ramipril 10 Mg  Caps (Ramipril) .... One By Mouth Bid 13)  Adult Aspirin Ec Low Strength 81 Mg Tbec (Aspirin) .... Take 1  Tablet By Mouth Once A Day 14)  Allopurinol 300 Mg Tabs (Allopurinol) .[name].. 1 By Mouth Once Daily 15)  Onetouch Ultra Test  Strp (Glucose Blood) .... Use Asd 1 Once Daily 16)  Vitamin D .... Take 1 Tablet By Mouth Once A Day 17)  Fish Oil   Oil (Fish Oil) .[name].. 1 Capsule Once Daily 18)  Diet Vitamin .[name].. 1 By Mouth Daily  Allergies (verified): No Known Drug Allergies  Social History: Packs/Day:  <0.25  Review of Systems       The patient complains of headaches.  The patient denies shortness of breath with activity, shortness of breath at rest, productive cough, non-productive cough, coughing up blood, chest pain, irregular heartbeats, acid heartburn, indigestion, loss of appetite, weight change, abdominal pain, difficulty swallowing, sore throat, tooth/dental problems, nasal congestion/difficulty breathing through nose, sneezing, itching, ear ache, anxiety, depression, hand/feet swelling, joint stiffness or pain, rash, change in color of mucus, and fever.         some meds cause the headaches  Vital Signs:  Patient profile:   55 year old male Height:      72 inches Weight:      269.38 pounds O2 Sat:  97 % on Room air Temp:     98.2 degrees F oral Pulse rate:   77 / minute BP sitting:   110 / 74  (right arm) Cuff size:   large  Vitals Entered By: Charma Igo (January 09, 2010 10:57 AM)  O2 Flow:  Room air CC: 1 year follow up, Pt states uses cpapc 7/7 nights, 6-7 hrs a night, Pt states he has no problems with cpap, Pt states on get's up once a night and that is to use BR.  Is Patient Diabetic? Yes Comments meds and allergies udpated Phone number updated Charma Igo  January 09, 2010 10:59 AM    Physical Exam  General:  obese male in nad Nose:  no skin breakdown or pressure necrosis from cpap mask. Extremities:  no edema or cyanosis  Neurologic:  alert and oriented, appears sleepy though, moves all 4.   Impression & Recommendations:  Problem # 1:  OBSTRUCTIVE  SLEEP APNEA (ICD-327.23) the pt is doing well with cpap, and is wearing compliantly.  He is having no mask or pressure issues.  Despite this, he is still having some daytime sleepiness with periods of inactivity.  He blames this on his meds, but I think we need to make sure his cpap machine is set properly given his weight gain over the past 2 years.  Will set him on auto mode for 2 weeks, and will call him once download is available.  Other Orders: Est. Patient Level III [id number]) DME Referral (DME)  Patient Instructions: 1)  will recheck your pressure at home, and let you know the results. 2)  work on weight loss 3)  followup up with me in one year.    Appended Document: rov for osa we have received pt cpap download and put into your very important look at folder

## 2010-06-03 NOTE — Medication Information (Signed)
Summary: rov/ewj  Anticoagulant Therapy  Managed by: Freddrick March, RN, BSN PCP: Dr Cathlean Cower Supervising MD: Harrington Challenger MD, Nevin Bloodgood Indication 1: Atrial Fibrillation (ICD-427.31) Lab Used: [place] Site: [facility] INR POC 2.9 INR RANGE 2 - 3  Dietary changes: no    Health status changes: no    Bleeding/hemorrhagic complications: no    Recent/future hospitalizations: no    Any changes in medication regimen? no    Recent/future dental: no  Any missed doses?: yes     Details: May have missed 1 dose a couple weeks ago.    Is patient compliant with meds? yes       Allergies (verified): No Known Drug Allergies  Anticoagulation Management History:      The patient is taking warfarin and comes in today for a routine follow up visit.  Positive risk factors for bleeding include presence of serious comorbidities.  Negative risk factors for bleeding include an age less than 55 years old.  The bleeding index is 'intermediate risk'.  Positive CHADS2 values include History of CHF, History of HTN, and History of Diabetes.  Negative CHADS2 values include Age > 41 years old.  The start date was 05/10/2008.  His last INR was 1.4.  Anticoagulation responsible provider: Harrington Challenger MD, Nevin Bloodgood.  INR POC: 2.9.  Cuvette Lot#: [id number].  Exp: 09/2010.    Anticoagulation Management Assessment/Plan:      The patient's current anticoagulation dose is Warfarin sodium 5 mg tabs: use as directed.  The target INR is 2 - 3.  The next INR is due 08/16/2009.  Anticoagulation instructions were given to patient.  Results were reviewed/authorized by Freddrick March, RN, BSN.  He was notified by Freddrick March RN.         Prior Anticoagulation Instructions: INR 2.0  Take 15mg  today then resume same dosage 15mg  daily except 10mg  on Thursdays.  Recheck in 4 weeks.    Current Anticoagulation Instructions: INR 2.9  Continue on same dosage 3 tablets daily except 2 tablets on Thursdays.  Recheck in 4 weeks.

## 2010-06-03 NOTE — Medication Information (Signed)
Summary: rov/cb  Anticoagulant Therapy  Managed by: Freddrick March, RN, BSN PCP: Dr Cathlean Cower Supervising MD: Johnsie Cancel MD, Collier Salina Indication 1: Atrial Fibrillation (ICD-427.31) Lab Used: [facility] Site: [facility] INR POC 2.2 INR RANGE 2 - 3  Dietary changes: no    Health status changes: no    Bleeding/hemorrhagic complications: no    Recent/future hospitalizations: no    Any changes in medication regimen? yes       Details: Diabetic Tussin PRN   Recent/future dental: no  Any missed doses?: yes     Details: Missed 1 dose on 11/04/09.  Is patient compliant with meds? yes       Allergies: No Known Drug Allergies  Anticoagulation Management History:      The patient is taking warfarin and comes in today for a routine follow up visit.  Positive risk factors for bleeding include presence of serious comorbidities.  Negative risk factors for bleeding include an age less than 66 years old.  The bleeding index is 'intermediate risk'.  Positive CHADS2 values include History of CHF, History of HTN, and History of Diabetes.  Negative CHADS2 values include Age > 59 years old.  The start date was 05/10/2008.  His last INR was 1.4.  Anticoagulation responsible provider: Johnsie Cancel MD, Collier Salina.  INR POC: 2.2.  Cuvette Lot#: [id number].  Exp: 01/2011.    Anticoagulation Management Assessment/Plan:      The patient's current anticoagulation dose is Warfarin sodium 5 mg tabs: use as directed.  The target INR is 2 - 3.  The next INR is due 12/06/2009.  Anticoagulation instructions were given to patient.  Results were reviewed/authorized by Freddrick March, RN, BSN.  He was notified by Freddrick March RN.         Prior Anticoagulation Instructions: INR 2.2. Take 15 mg daily except 10 mg on Thurs.  Recheck in 4 weeks.  Current Anticoagulation Instructions: INR 2.2 Continue 15mg s daily except 10mg s on Thursdays. Recheck in 4 weeks.

## 2010-06-03 NOTE — Medication Information (Signed)
Summary: rov/ewj   Anticoagulant Therapy  Managed by: Gypsy Lore, PharmD PCP: Dr Cathlean Cower Supervising MD: Olevia Perches MD, Bruce Indication 1: Atrial Fibrillation (ICD-427.31) Lab Used: [facility] Site: [facility] INR POC 2.9 INR RANGE 2 - 3  Dietary changes: no    Health status changes: no    Bleeding/hemorrhagic complications: no    Recent/future hospitalizations: no    Any changes in medication regimen? no    Recent/future dental: no  Any missed doses?: no       Is patient compliant with meds? yes       Allergies: No Known Drug Allergies  Anticoagulation Management History:      The patient is taking warfarin and comes in today for a routine follow up visit.  Positive risk factors for bleeding include presence of serious comorbidities.  Negative risk factors for bleeding include an age less than 69 years old.  The bleeding index is 'intermediate risk'.  Positive CHADS2 values include History of CHF, History of HTN, and History of Diabetes.  Negative CHADS2 values include Age > 12 years old.  The start date was 05/10/2008.  His last INR was 1.4.  Anticoagulation responsible provider: Olevia Perches MD, Darnell Level.  INR POC: 2.9.  Cuvette Lot#: [id number].  Exp: 03/2011.    Anticoagulation Management Assessment/Plan:      The patient's current anticoagulation dose is Warfarin sodium 5 mg tabs: use as directed.  The target INR is 2 - 3.  The next INR is due 04/04/2010.  Anticoagulation instructions were given to patient.  Results were reviewed/authorized by Gypsy Lore, PharmD.  He was notified by Gypsy Lore PharmD.         Prior Anticoagulation Instructions: INR 1.9  Start taking 3 tablets daily.  Recheck in 3 weeks.    Current Anticoagulation Instructions: INR 2.9  Continue taking Coumadin 3 tabs (15 mg) every day.  Return to clinic in 4 weeks.

## 2010-06-03 NOTE — Medication Information (Signed)
Summary: rov/cb  Anticoagulant Therapy  Managed by: Tula Nakayama, RN, BSN PCP: Dr Cathlean Cower Supervising MD: Haroldine Laws MD, Quillian Quince Indication 1: Atrial Fibrillation (ICD-427.31) Lab Used: [facility] Site: [facility] INR POC 2.5 INR RANGE 2 - 3  Dietary changes: no    Health status changes: no    Bleeding/hemorrhagic complications: no    Recent/future hospitalizations: no    Any changes in medication regimen? no    Recent/future dental: no  Any missed doses?: no       Is patient compliant with meds? yes       Allergies: No Known Drug Allergies  Anticoagulation Management History:      The patient is taking warfarin and comes in today for a routine follow up visit.  Positive risk factors for bleeding include presence of serious comorbidities.  Negative risk factors for bleeding include an age less than 60 years old.  The bleeding index is 'intermediate risk'.  Positive CHADS2 values include History of CHF, History of HTN, and History of Diabetes.  Negative CHADS2 values include Age > 62 years old.  The start date was 05/10/2008.  His last INR was 1.4.  Anticoagulation responsible provider: Bensimhon MD, Quillian Quince.  INR POC: 2.5.  Cuvette Lot#: [id number].  Exp: 12/2010.    Anticoagulation Management Assessment/Plan:      The patient's current anticoagulation dose is Warfarin sodium 5 mg tabs: use as directed.  The target INR is 2 - 3.  The next INR is due 10/11/2009.  Anticoagulation instructions were given to patient.  Results were reviewed/authorized by Tula Nakayama, RN, BSN.  He was notified by Tula Nakayama, RN, BSN.         Prior Anticoagulation Instructions: INR 2.9. Take 3 tablets daily except 2 tablets on Thursday.  Current Anticoagulation Instructions: INR 2.5 Continue 15mg s daily except 10mg s on Thursdays. Recheck in 4 weeks.

## 2010-06-03 NOTE — Assessment & Plan Note (Signed)
Summary: 6 MO ROV /NWS #   Vital Signs:  Patient profile:   55 year old male Height:      72 inches Weight:      265.75 pounds BMI:     36.17 O2 Sat:      97 % on Room air Temp:     98.2 degrees F oral Pulse rate:   80 / minute BP sitting:   108 / 68  (left arm) Cuff size:   large  Vitals Entered By: Shirlean Mylar Ewing CMA Deborra Medina) (December 24, 2009 9:58 AM)  O2 Flow:  Room air CC: 6 month ROV/RE   Primary Care Provider:  Dr Cathlean Cower  CC:  6 month ROV/RE.  History of Present Illness: here to f/u - simvastatin changed to lipitor per card due to risk and need for LDL control;  Pt denies CP, worsening sob, doe, wheezing, orthopnea, pnd, worsening LE edema, palps, dizziness or syncope  Pt denies new neuro symptoms such as headache, facial or extremity weakness   Pt denies polydipsia, polyuria, or low sugar symptoms such as shakiness improved with eating.  Overall good compliance with meds, trying to follow low chol, DM diet, wt stable, little excercise however  Last cbg at work was 119.    Problems Prior to Update: 1)  Automatic Implantable Cardiac Defibrillator Situ  (ICD-V45.02) 2)  Fatigue  (ICD-780.79) 3)  Preventive Health Care  (ICD-V70.0) 4)  Fever Unspecified  (ICD-780.60) 5)  Cad  (ICD-414.00) 6)  Atrial Fibrillation  (ICD-427.31) 7)  Paroxysmal Ventricular Tachycardia  (ICD-427.1) 8)  Acute Gouty Arthropathy  (ICD-274.01) 9)  Obstructive Sleep Apnea  (ICD-327.23) 10)  Hyperlipidemia  (ICD-272.4) 11)  Congestive Heart Failure  (ICD-428.0) 12)  Cerebrovascular Accident, Hx of  (ICD-V12.50) 13)  Gout  (ICD-274.9) 14)  Hypokalemia  (ICD-276.8) 15)  Family History of Cad Male 1st Degree Relative <60  (ICD-V16.49) 16)  Uri  (ICD-465.9) 17)  Special Screening Malignant Neoplasm of Prostate  (ICD-V76.44) 18)  Hypertension  (ICD-401.9) 19)  Diabetes Mellitus, Type II  (ICD-250.00) 20)  Dental Caries  (ICD-521.00) 21)  Lipoma  (ICD-214.9) 22)  Dyslipidemia  (ICD-272.4) 23)   Congestive Heart Failure Unspecified  (ICD-428.0) 24)  Acute But Ill-defined Cerebrovascular Disease  (ICD-436) 25)  Gout, Unspecified  (ICD-274.9)  Medications Prior to Update: 1)  Coreg 25 Mg Tabs (Carvedilol) .... Take 2 Tablets By Mouth Two Times A Day 2)  Lantus Solostar 100 Unit/ml  Soln (Insulin Glargine) .... Use Asd 10 Units Subcutaneously Once Daily 3)  Warfarin Sodium 5 Mg Tabs (Warfarin Sodium) .... Use As Directed 4)  Metformin Hcl 500 Mg  Tabs (Metformin Hcl) .... Take 1 Tablet By Mouth Two Times A Day 5)  Isosorbide Dinitrate 20 Mg Tabs (Isosorbide Dinitrate) .[name].. 1 By Mouth Three Times A Day 6)  Hydralazine Hcl 25 Mg  Tabs (Hydralazine Hcl) .... Take 1 Tablet By Mouth Three Times A Day 7)  Klor-Con M20 20 Meq  Tbcr (Potassium Chloride Crys Cr) .... 2 Tablets By Mouth Three Times A Day 8)  Lasix 80 Mg  Tabs (Furosemide) .... Take 1 Tablet By Mouth Two Times A Day 9)  Lipitor 40 Mg Tabs (Atorvastatin Calcium) .... Take One Tablet By Mouth Daily. 10)  Amlodipine Besylate 10 Mg Tabs (Amlodipine Besylate) .[name].. 1 By Mouth Daily 11)  Multivitamins   Tabs (Multiple Vitamin) .... Take 1 Tablet By Mouth Once A Day 12)  Ramipril 10 Mg  Caps (Ramipril) .... One By Mouth  Bid 13)  Adult Aspirin Ec Low Strength 81 Mg Tbec (Aspirin) .... Take 1 Tablet By Mouth Once A Day 14)  Allopurinol 300 Mg Tabs (Allopurinol) .[name].. 1 By Mouth Once Daily 15)  Onetouch Ultra Test  Strp (Glucose Blood) .... Use Asd 1 Once Daily 16)  Vitamin D .... Take 1 Tablet By Mouth Once A Day 17)  Fish Oil   Oil (Fish Oil) .[name].. 1 Capsule Once Daily  Current Medications (verified): 1)  Coreg 25 Mg Tabs (Carvedilol) .... Take 2 Tablets By Mouth Two Times A Day 2)  Lantus Solostar 100 Unit/ml  Soln (Insulin Glargine) .... Use Asd 10 Units Subcutaneously Once Daily 3)  Warfarin Sodium 5 Mg Tabs (Warfarin Sodium) .... Use As Directed 4)  Metformin Hcl 500 Mg  Tabs (Metformin Hcl) .... Take 1 Tablet By Mouth Two Times A  Day 5)  Isosorbide Dinitrate 20 Mg Tabs (Isosorbide Dinitrate) .[name].. 1 By Mouth Three Times A Day 6)  Hydralazine Hcl 25 Mg  Tabs (Hydralazine Hcl) .... Take 1 Tablet By Mouth Three Times A Day 7)  Klor-Con M20 20 Meq  Tbcr (Potassium Chloride Crys Cr) .... 2 Tablets By Mouth Three Times A Day 8)  Lasix 80 Mg  Tabs (Furosemide) .... Take 1 Tablet By Mouth Two Times A Day 9)  Lipitor 40 Mg Tabs (Atorvastatin Calcium) .... Take One Tablet By Mouth Daily. 10)  Amlodipine Besylate 10 Mg Tabs (Amlodipine Besylate) .[name].. 1 By Mouth Daily 11)  Multivitamins   Tabs (Multiple Vitamin) .... Take 1 Tablet By Mouth Once A Day 12)  Ramipril 10 Mg  Caps (Ramipril) .... One By Mouth Bid 13)  Adult Aspirin Ec Low Strength 81 Mg Tbec (Aspirin) .... Take 1 Tablet By Mouth Once A Day 14)  Allopurinol 300 Mg Tabs (Allopurinol) .[name].. 1 By Mouth Once Daily 15)  Onetouch Ultra Test  Strp (Glucose Blood) .... Use Asd 1 Once Daily 16)  Vitamin D .... Take 1 Tablet By Mouth Once A Day 17)  Fish Oil   Oil (Fish Oil) .[name].. 1 Capsule Once Daily  Allergies (verified): No Known Drug Allergies  Past History:  Past Surgical History: Last updated: 12/06/2007 S/P ICD s/p lipoma to right back 5/09 s/p PTCA  Social History: Last updated: 12/24/2009 Occupation:    Works as a custodian part time Married with two children Alcohol use-yes - very occassionally Former Smoker Quit smoking 30 yrs ago.   Smoked as teenager less than 1/2 ppd.  Smoked x 4 years.  Risk Factors: Smoking Status: quit (05/09/2007)  Past Medical History: Reviewed history from 10/11/2008 and no changes required. 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) OBSTRUCTIVE SLEEP APNEA (ICD-327.23) HYPERLIPIDEMIA (ICD-272.4) CONGESTIVE HEART FAILURE (ICD-428.0) (EF 25%) CEREBROVASCULAR ACCIDENT, HX OF (ICD-V12.50) GOUT (ICD-274.9) HYPOKALEMIA (ICD-276.8) FAMILY HISTORY OF CAD MALE 1ST DEGREE RELATIVE <60 (ICD-V16.49) URI (ICD-465.9) SPECIAL SCREENING  MALIGNANT NEOPLASM OF PROSTATE (ICD-V76.44) HYPERTENSION (ICD-401.9) DIABETES MELLITUS, TYPE II (ICD-250.00) DENTAL CARIES (ICD-521.00) LIPOMA (ICD-214.9) DYSLIPIDEMIA (ICD-272.4) CONGESTIVE HEART FAILURE UNSPECIFIED (ICD-428.0) ACUTE BUT ILL-DEFINED CEREBROVASCULAR DISEASE (ICD-436) GOUT, UNSPECIFIED (ICD-274.9) Nonobstructive coronary disease (catheterization in 2009 with left main calcified, LAD with luminal irregularities, circumflex with 60-70% PDA stenosis, the right coronary artery is nondominant with 60% stenosis)  Social History: Reviewed history from 06/11/2008 and no changes required. Occupation:    Works as a Sports coach part time Married with two children Alcohol use-yes - very occassionally Former Smoker Quit smoking 30 yrs ago.   Smoked as teenager less than 1/2 ppd.  Smoked  x 4 years.  Review of Systems       all otherwise negative per pt -    Physical Exam  General:  alert and overweight-appearing.   Head:  normocephalic and atraumatic.   Eyes:  vision grossly intact, pupils equal, and pupils round.   Ears:  R ear normal and L ear normal.   Nose:  no external deformity and no nasal discharge.   Mouth:  no gingival abnormalities and pharynx pink and moist.   Neck:  supple and no masses.   Lungs:  normal respiratory effort and normal breath sounds.   Heart:  normal rate and irregular rhythm.   Extremities:  no edema, no erythema    Impression & Recommendations:  Problem # 1:  DIABETES MELLITUS, TYPE II (ICD-250.00)  His updated medication list for this problem includes:    Lantus Solostar 100 Unit/ml Soln (Insulin glargine) ..... Use asd 10 units subcutaneously once daily    Metformin Hcl 500 Mg Tabs (Metformin hcl) .[name]... Take 1 tablet by mouth two times a day    Ramipril 10 Mg Caps (Ramipril) ..... One by mouth bid    Adult Aspirin Ec Low Strength 81 Mg Tbec (Aspirin) .[name]... Take 1 tablet by mouth once a day  Labs Reviewed: Creat: 0.9 (06/17/2009)      Reviewed HgBA1c results: 5.8 (06/17/2009)  5.5 (12/10/2008) stable overall by hx and exam, ok to continue meds/tx as is -; Pt to cont DM diet, excercise, wt loss efforts; to check labs with next draw in 10 days  Problem # 2:  HYPERTENSION (ICD-401.9)  His updated medication list for this problem includes:    Coreg 25 Mg Tabs (Carvedilol) .[name]... Take 2 tablets by mouth two times a day    Hydralazine Hcl 25 Mg Tabs (Hydralazine hcl) .[name]... Take 1 tablet by mouth three times a day    Lasix 80 Mg Tabs (Furosemide) .[name]... Take 1 tablet by mouth two times a day    Amlodipine Besylate 10 Mg Tabs (Amlodipine besylate) .[name]... 1 by mouth daily    Ramipril 10 Mg Caps (Ramipril) ..... One by mouth bid  BP today: 108/68 Prior BP: 128/80 (08/15/2009)  Labs Reviewed: K+: 3.3 (06/17/2009) Creat: : 0.9 (06/17/2009)   Chol: 157 (06/17/2009)   HDL: 45.50 (06/17/2009)   LDL: 103 (06/17/2009)   TG: 43.0 (06/17/2009) stable overall by hx and exam, ok to continue meds/tx as is   Problem # 3:  DYSLIPIDEMIA (ICD-272.4)  His updated medication list for this problem includes:    Lipitor 40 Mg Tabs (Atorvastatin calcium) .[name]... Take one tablet by mouth daily.  Labs Reviewed: SGOT: 20 (07/12/2008)   SGPT: 19 (07/12/2008)   HDL:45.50 (06/17/2009), 35.90 (12/10/2008)  LDL:103 (06/17/2009), 150 (12/10/2008)  Chol:157 (06/17/2009), 198 (12/10/2008)  Trig:43.0 (06/17/2009), 60.0 (12/10/2008) recent change in statin aug 10;  will re-check labs in 10 more days  - Pt to continue diet efforts, good med tolerance; to check labs - goal LDL less than 70   Complete Medication List: 1)  Coreg 25 Mg Tabs (Carvedilol) .... Take 2 tablets by mouth two times a day 2)  Lantus Solostar 100 Unit/ml Soln (Insulin glargine) .... Use asd 10 units subcutaneously once daily 3)  Warfarin Sodium 5 Mg Tabs (Warfarin sodium) .... Use as directed 4)  Metformin Hcl 500 Mg Tabs (Metformin hcl) .... Take 1 tablet by mouth two times a day 5)   Isosorbide Dinitrate 20 Mg Tabs (Isosorbide dinitrate) .[name].. 1 by mouth three  times a day 6)  Hydralazine Hcl 25 Mg Tabs (Hydralazine hcl) .... Take 1 tablet by mouth three times a day 7)  Klor-con M20 20 Meq Tbcr (Potassium chloride crys cr) .... 2 tablets by mouth three times a day 8)  Lasix 80 Mg Tabs (Furosemide) .... Take 1 tablet by mouth two times a day 9)  Lipitor 40 Mg Tabs (Atorvastatin calcium) .... Take one tablet by mouth daily. 10)  Amlodipine Besylate 10 Mg Tabs (Amlodipine besylate) .[name].. 1 by mouth daily 11)  Multivitamins Tabs (Multiple vitamin) .... Take 1 tablet by mouth once a day 12)  Ramipril 10 Mg Caps (Ramipril) .... One by mouth bid 13)  Adult Aspirin Ec Low Strength 81 Mg Tbec (Aspirin) .... Take 1 tablet by mouth once a day 14)  Allopurinol 300 Mg Tabs (Allopurinol) .[name].. 1 by mouth once daily 15)  Onetouch Ultra Test Strp (Glucose blood) .... Use asd 1 once daily 16)  Vitamin D  .... Take 1 tablet by mouth once a day 17)  Fish Oil Oil (Fish oil) .[name].. 1 capsule once daily  Patient Instructions: 1)  Continue all previous medications as before this visit 2)  Please return in 10 days for LAB only: 3)  BMP prior to visit, ICD-9: 250.02 4)  Lipid Panel prior to visit, ICD-9: 5)  HbgA1C prior to visit, ICD-9: 6)  Please call the number on the [facility] for results of your testing  7)  Please schedule a follow-up appointment in 6 months for your "yearly medicare exam"

## 2010-06-03 NOTE — Assessment & Plan Note (Signed)
Summary: FLU VAC  [place]  [place]   Nurse Visit   Allergies: No Known Drug Allergies  Orders Added: 1)  Flu Vaccine 2yrs + MEDICARE PATIENTS [Q2039] 2)  Administration Flu vaccine - MCR [id number] Flu Vaccine Consent Questions     Do you have a history of severe allergic reactions to this vaccine? no    Any prior history of allergic reactions to egg and/or gelatin? no    Do you have a sensitivity to the preservative Thimersol? no    Do you have a past history of Guillan-Barre Syndrome? no    Do you currently have an acute febrile illness? no    Have you ever had a severe reaction to latex? no    Vaccine information given and explained to patient? yes    Are you currently pregnant? no    Lot Number:[id number]   Exp Date:11/01/2010   Site Given  Left Deltoid IMistration Flu vaccine - MCR [id number] .lbmedflu

## 2010-06-03 NOTE — Progress Notes (Signed)
Summary: Potassium Refill  Phone Note Refill Request Message from:  Fax from Pharmacy on October 28, 2009 8:57 AM  Refills Requested: Medication #1:  KLOR-CON M20 20 MEQ  TBCR 2 tablets by mouth three times a day   Dosage confirmed as above?Dosage Confirmed   Brand Name Necessary? No   Supply Requested: 3 months  Method Requested: Electronic Next Appointment Scheduled: 12/18/2009 Initial call taken by: Jiles Garter CMA,  October 28, 2009 8:57 AM    Prescriptions: KLOR-CON M20 20 MEQ  TBCR (POTASSIUM CHLORIDE CRYS CR) 2 tablets by mouth three times a day  #120 Tablet x 0   Entered by:   Sharon Seller CMA (AAMA)   Authorized by:   Biagio Borg MD   Signed by:   Sharon Seller CMA ([place]) on 10/28/2009   Method used:   Electronically to        [facility] [phone]* (retail)       [address]       [place], [place]  [address]       Ph: [phone] or [phone]       Fax: [phone]   RxID:   [id number]

## 2010-06-03 NOTE — Letter (Signed)
Summary: Device-Delinquent Check  [place] [place] [address] [place]   [place], [place] [address]   Phone: [phone]  Fax: [phone]     January 31, 2010 MRN: [id number]   [name] [address] [place], [place]  [address]   Dear Mr. TINKEY,  According to our records, you have not had your implanted device checked in the recommended period of time.  We are unable to determine appropriate device function without checking your device on a regular basis.  Please call our office to schedule an appointment with [facility],  as soon as possible.  If you are having your device checked by another physician, please call us so that we may update our records.  Thank you, Gasper Sells, EMT  January 31, 2010 10:40 AM  [facility]

## 2010-06-03 NOTE — Assessment & Plan Note (Signed)
Summary: device/saf  Medications Added FISH OIL   OIL (FISH OIL) 1 capsule once daily      Allergies Added: NKDA  Visit Type:  Follow-up Referring Provider:  Dr Percival Spanish Primary Provider:  Dr Cathlean Cower   History of Present Illness: Mr. Botz returns today for followup.  He is a pleasant 55 yo man with a h/o VT, DCM, CHF who continues to work and exercise regularly.  He has class 2 CHF.  He denies c/p, sob, except with extreme exertion, and peripheral edema. No recurrent ICD shocks since his last one over a year ago.  Current Medications (verified): 1)  Coreg 25 Mg Tabs (Carvedilol) .... Take 2 Tablets By Mouth Two Times A Day 2)  Lantus Solostar 100 Unit/ml  Soln (Insulin Glargine) .... Use Asd 10 Units Subcutaneously Once Daily 3)  Warfarin Sodium 5 Mg Tabs (Warfarin Sodium) .... Use As Directed 4)  Metformin Hcl 500 Mg  Tabs (Metformin Hcl) .... Take 1 Tablet By Mouth Two Times A Day 5)  Isosorbide Dinitrate 20 Mg Tabs (Isosorbide Dinitrate) .[name].. 1 By Mouth Three Times A Day 6)  Hydralazine Hcl 25 Mg  Tabs (Hydralazine Hcl) .... Take 1 Tablet By Mouth Three Times A Day 7)  Klor-Con M20 20 Meq  Tbcr (Potassium Chloride Crys Cr) .... 2 Tablets By Mouth Three Times A Day 8)  Lasix 80 Mg  Tabs (Furosemide) .... Take 1 Tablet By Mouth Two Times A Day 9)  Simvastatin 80 Mg Tabs (Simvastatin) .[name].. 1po Once Daily 10)  Amlodipine Besylate 10 Mg Tabs (Amlodipine Besylate) .[name].. 1 By Mouth Daily 11)  Multivitamins   Tabs (Multiple Vitamin) .... Take 1 Tablet By Mouth Once A Day 12)  Ramipril 10 Mg  Caps (Ramipril) .... One By Mouth Bid 13)  Adult Aspirin Ec Low Strength 81 Mg Tbec (Aspirin) .... Take 1 Tablet By Mouth Once A Day 14)  Allopurinol 300 Mg Tabs (Allopurinol) .[name].. 1 By Mouth Once Daily 15)  Onetouch Ultra Test  Strp (Glucose Blood) .... Use Asd 1 Once Daily 16)  Vitamin D .... Take 1 Tablet By Mouth Once A Day 17)  Fish Oil   Oil (Fish Oil) .[name].. 1 Capsule Once Daily  Allergies  (verified): No Known Drug Allergies  Past History:  Past Medical History: Last updated: 10/11/2008 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) OBSTRUCTIVE SLEEP APNEA (ICD-327.23) HYPERLIPIDEMIA (ICD-272.4) CONGESTIVE HEART FAILURE (ICD-428.0) (EF 25%) CEREBROVASCULAR ACCIDENT, HX OF (ICD-V12.50) GOUT (ICD-274.9) HYPOKALEMIA (ICD-276.8) FAMILY HISTORY OF CAD MALE 1ST DEGREE RELATIVE <60 (ICD-V16.49) URI (ICD-465.9) SPECIAL SCREENING MALIGNANT NEOPLASM OF PROSTATE (ICD-V76.44) HYPERTENSION (ICD-401.9) DIABETES MELLITUS, TYPE II (ICD-250.00) DENTAL CARIES (ICD-521.00) LIPOMA (ICD-214.9) DYSLIPIDEMIA (ICD-272.4) CONGESTIVE HEART FAILURE UNSPECIFIED (ICD-428.0) ACUTE BUT ILL-DEFINED CEREBROVASCULAR DISEASE (ICD-436) GOUT, UNSPECIFIED (ICD-274.9) Nonobstructive coronary disease (catheterization in 2009 with left main calcified, LAD with luminal irregularities, circumflex with 60-70% PDA stenosis, the right coronary artery is nondominant with 60% stenosis)  Past Surgical History: Last updated: 12/06/2007 S/P ICD s/p lipoma to right back 5/09 s/p PTCA  Review of Systems  The patient denies chest pain, syncope, dyspnea on exertion, and peripheral edema.    Vital Signs:  Patient profile:   55 year old male Height:      72 inches Weight:      272 pounds BMI:     37.02 Pulse rate:   70 / minute BP sitting:   128 / 80  (left arm)  Vitals Entered By: Margaretmary Bayley CMA (August 15, 2009 9:49 AM)  Physical  Exam  General:  alert and overweight-appearing.   Head:  normocephalic and atraumatic.   Eyes:  vision grossly intact, pupils equal, and pupils round.   Mouth:  no gingival abnormalities and pharynx pink and moist.   Neck:  supple and no masses.   Chest Wall:  well-healed ICD pocket Lungs:  normal respiratory effort and normal breath sounds.   Heart:  normal rate and regular rhythm.  PMI is enlarged and laterally displaced. Abdomen:  soft, non-tender, and normal bowel sounds.     Msk:  no joint tenderness and no joint swelling.   Pulses:  normal pedal pulses bilaterally.   Extremities:  no edema, no erythema  Neurologic:  cranial nerves II-XII intact and strength normal in all extremities.      ICD Specifications Following MD:  Cristopher Peru, MD     ICD Vendor:  [facility]     ICD Model Number:  [id number]     ICD Serial Number:  [id number] ICD DOI:  12/18/2005     ICD Implanting MD:  Cristopher Peru, MD  Lead 1:    Location: RV     DOI: 12/18/2005     Model #: [id number]     Serial #: [id number]     Status: active  Indications::  NICM   ICD Follow Up Remote Check?  No Battery Voltage:  2.95 V     Charge Time:  10.5 seconds     Underlying rhythm:  SR ICD Dependent:  No       ICD Device Measurements Right Ventricle:  Amplitude: 12 mV, Impedance: 460 ohms, Threshold: 1.25 V at 0.5 msec Shock Impedance: 40 ohms   Episodes Coumadin:  Yes Shock:  0     ATP:  0     Nonsustained:  0     Ventricular Pacing:  <1%  Brady Parameters Mode VVI     Lower Rate Limit:  40      Tachy Zones VF:  240     VT:  214     VT1:  188     Next Cardiology Appt Due:  11/01/2009 Tech Comments:  Normal device function.  No changes made today.  Pt prefers office visits to [employer].  ROV 3 months clinic. Chanetta Marshall RN BSN  August 15, 2009 10:01 AM  MD Comments:  Agree with above.  Impression & Recommendations:  Problem # 1:  PAROXYSMAL VENTRICULAR TACHYCARDIA (ICD-427.1) He has had no recurrent episodes of VT.  Continue meds as below. His updated medication list for this problem includes:    Coreg 25 Mg Tabs (Carvedilol) .[name]... Take 2 tablets by mouth two times a day    Warfarin Sodium 5 Mg Tabs (Warfarin sodium) ..... Use as directed    Isosorbide Dinitrate 20 Mg Tabs (Isosorbide dinitrate) .[name]... 1 by mouth three times a day    Amlodipine Besylate 10 Mg Tabs (Amlodipine besylate) .[name]... 1 by mouth daily    Ramipril 10 Mg Caps (Ramipril) ..... One by mouth bid    Adult Aspirin Ec Low Strength 81 Mg Tbec  (Aspirin) .[name]... Take 1 tablet by mouth once a day  Problem # 2:  AUTOMATIC IMPLANTABLE CARDIAC DEFIBRILLATOR SITU (ICD-V45.02) His device is working normally today.  Continue followup in several months.  Problem # 3:  CONGESTIVE HEART FAILURE (ICD-428.0) His symptoms appear to be well controlled on meds as below.  I have encouraged him to continue regular daily exercise and a low sodium diet. His updated medication list  for this problem includes:    Coreg 25 Mg Tabs (Carvedilol) .[name]... Take 2 tablets by mouth two times a day    Warfarin Sodium 5 Mg Tabs (Warfarin sodium) ..... Use as directed    Isosorbide Dinitrate 20 Mg Tabs (Isosorbide dinitrate) .[name]... 1 by mouth three times a day    Lasix 80 Mg Tabs (Furosemide) .[name]... Take 1 tablet by mouth two times a day    Amlodipine Besylate 10 Mg Tabs (Amlodipine besylate) .[name]... 1 by mouth daily    Ramipril 10 Mg Caps (Ramipril) ..... One by mouth bid    Adult Aspirin Ec Low Strength 81 Mg Tbec (Aspirin) .[name]... Take 1 tablet by mouth once a day  Patient Instructions: 1)  Your physician recommends that you schedule a follow-up appointment in: 3 monts with device clinic.  2)  Your physician recommends that you continue on your current medications as directed. Please refer to the Current Medication list given to you today.

## 2010-06-05 NOTE — Cardiovascular Report (Signed)
Summary: Office Visit   Office Visit   Imported By: Sallee Provencal 05/23/2010 16:17:58  _____________________________________________________________________  External Attachment:    Type:   Image     Comment:   External Document

## 2010-06-05 NOTE — Procedures (Signed)
Summary: device/saf      Allergies Added: NKDA  Current Medications (verified): 1)  Coreg 25 Mg Tabs (Carvedilol) .... Take 2 Tablets By Mouth Two Times A Day 2)  Lantus Solostar 100 Unit/ml  Soln (Insulin Glargine) .... Use Asd 10 Units Subcutaneously Once Daily 3)  Warfarin Sodium 5 Mg Tabs (Warfarin Sodium) .... Use As Directed 4)  Metformin Hcl 500 Mg  Tabs (Metformin Hcl) .... Take 1 Tablet By Mouth Two Times A Day 5)  Isosorbide Dinitrate 20 Mg Tabs (Isosorbide Dinitrate) .[name].. 1 By Mouth Three Times A Day 6)  Hydralazine Hcl 25 Mg  Tabs (Hydralazine Hcl) .... Take 1 Tablet By Mouth Three Times A Day 7)  Klor-Con M20 20 Meq  Tbcr (Potassium Chloride Crys Cr) .... 2 Tablets By Mouth Three Times A Day 8)  Lasix 80 Mg  Tabs (Furosemide) .... Take 1 Tablet By Mouth Two Times A Day 9)  Lipitor 40 Mg Tabs (Atorvastatin Calcium) .... Take One Tablet By Mouth Daily. 10)  Amlodipine Besylate 10 Mg Tabs (Amlodipine Besylate) .[name].. 1 By Mouth Daily 11)  Multivitamins   Tabs (Multiple Vitamin) .... Take 1 Tablet By Mouth Once A Day 12)  Ramipril 10 Mg  Caps (Ramipril) .... One By Mouth Bid 13)  Adult Aspirin Ec Low Strength 81 Mg Tbec (Aspirin) .... Take 1 Tablet By Mouth Once A Day 14)  Allopurinol 300 Mg Tabs (Allopurinol) .[name].. 1 By Mouth Once Daily 15)  Onetouch Ultra Test  Strp (Glucose Blood) .... Use Asd 1 Once Daily 16)  Vitamin D .... Take 1 Tablet By Mouth Once A Day 17)  Fish Oil   Oil (Fish Oil) .[name].. 1 Capsule Once Daily 18)  Diet Vitamin .[name].. 1 By Mouth Daily  Allergies (verified): No Known Drug Allergies   ICD Specifications Following MD:  Cristopher Peru, MD     ICD Vendor:  [employer] Jude     ICD Model Number:  [phone]     ICD Serial Number:  [id number] ICD DOI:  12/18/2005     ICD Implanting MD:  Cristopher Peru, MD  Lead 1:    Location: RV     DOI: 12/18/2005     Model #: [id number]     Serial #: [id number]     Status: active  Indications::  NICM   ICD Follow Up Remote Check?  No Battery  Voltage:  2.66 V     Charge Time:  10.9 seconds     ICD Dependent:  No       ICD Device Measurements Right Ventricle:  Amplitude: 12 mV, Impedance: 490 ohms, Threshold: 1.0 V at 0.5 msec Configuration: `  Episodes Coumadin:  Yes Shock:  0     ATP:  0     Nonsustained:  0      Brady Parameters Mode VVI     Lower Rate Limit:  40      Tachy Zones VF:  240     VT:  214     VT1:  188     Next Cardiology Appt Due:  08/03/2010 Tech Comments:  No parameter changes.  Device function normal.  ROV 3 months with Dr. Lovena Le. Alma Friendly, LPN  January 18, [id number] 10:50 AM

## 2010-06-05 NOTE — Medication Information (Signed)
Summary: ROV  Anticoagulant Therapy  Managed by: Freddrick March, RN, BSN PCP: Dr Cathlean Cower Supervising MD: Johnsie Cancel MD, Collier Salina Indication 1: Atrial Fibrillation (ICD-427.31) Lab Used: [facility] Site: [facility] INR POC 3.0 INR RANGE 2 - 3  Dietary changes: no    Health status changes: no    Bleeding/hemorrhagic complications: no    Recent/future hospitalizations: no    Any changes in medication regimen? no    Recent/future dental: no  Any missed doses?: no       Is patient compliant with meds? yes       Allergies: No Known Drug Allergies  Anticoagulation Management History:      The patient is taking warfarin and comes in today for a routine follow up visit.  Positive risk factors for bleeding include presence of serious comorbidities.  Negative risk factors for bleeding include an age less than 36 years old.  The bleeding index is 'intermediate risk'.  Positive CHADS2 values include History of CHF, History of HTN, and History of Diabetes.  Negative CHADS2 values include Age > 19 years old.  The start date was 05/10/2008.  His last INR was 1.4.  Anticoagulation responsible provider: Johnsie Cancel MD, Collier Salina.  INR POC: 3.0.  Cuvette Lot#: [id number].  Exp: 06/2011.    Anticoagulation Management Assessment/Plan:      The patient's current anticoagulation dose is Warfarin sodium 5 mg tabs: use as directed.  The target INR is 2 - 3.  The next INR is due 06/06/2010.  Anticoagulation instructions were given to patient.  Results were reviewed/authorized by Freddrick March, RN, BSN.  He was notified by Freddrick March RN.         Prior Anticoagulation Instructions: INR: 1.7  Your INR is low today.  Please take 4 tablets (20 mg) tonight then continue with your normal schedule of 3 tablets (15 mg) daily.    Please return to clinic in 3 weeks   Current Anticoagulation Instructions: INR 3.0  Continue on same dosage 3 tablets daily. Recheck in 4 weeks.

## 2010-06-06 ENCOUNTER — Encounter: Payer: Self-pay | Admitting: Cardiology

## 2010-06-06 ENCOUNTER — Ambulatory Visit: Admit: 2010-06-06 | Payer: Self-pay

## 2010-06-06 ENCOUNTER — Encounter (INDEPENDENT_AMBULATORY_CARE_PROVIDER_SITE_OTHER): Payer: MEDICARE

## 2010-06-06 DIAGNOSIS — Z7901 Long term (current) use of anticoagulants: Secondary | ICD-10-CM

## 2010-06-06 DIAGNOSIS — I4891 Unspecified atrial fibrillation: Secondary | ICD-10-CM

## 2010-06-11 NOTE — Medication Information (Signed)
Summary: rov/ewj   Anticoagulant Therapy  Managed by: [name], PharmD PCP: Dr Cathlean Cower Supervising MD: Stanford Breed MD, Aaron Edelman Indication 1: Atrial Fibrillation (ICD-427.31) Lab Used: [facility] Site: [facility] INR POC 2.2 INR RANGE 2 - 3  Dietary changes: no    Health status changes: no    Bleeding/hemorrhagic complications: no    Recent/future hospitalizations: no    Any changes in medication regimen? no    Recent/future dental: no  Any missed doses?: no       Is patient compliant with meds? yes      Comments: Patient does want to start eating some green leafy vegetables and says that he will keep it consistent.    Allergies: No Known Drug Allergies  Anticoagulation Management History:      The patient is taking warfarin and comes in today for a routine follow up visit.  Positive risk factors for bleeding include presence of serious comorbidities.  Negative risk factors for bleeding include an age less than 7 years old.  The bleeding index is 'intermediate risk'.  Positive CHADS2 values include History of CHF, History of HTN, and History of Diabetes.  Negative CHADS2 values include Age > 64 years old.  The start date was 05/10/2008.  His last INR was 1.4.  Anticoagulation responsible provider: Stanford Breed MD, Aaron Edelman.  INR POC: 2.2.  Cuvette Lot#: [id number].  Exp: 05/2011.    Anticoagulation Management Assessment/Plan:      The patient's current anticoagulation dose is Warfarin sodium 5 mg tabs: use as directed.  The target INR is 2 - 3.  The next INR is due 07/04/2010.  Anticoagulation instructions were given to patient.  Results were reviewed/authorized by [name], PharmD.  He was notified by [name] PharmD Candidate.         Prior Anticoagulation Instructions: INR 3.0  Continue on same dosage 3 tablets daily. Recheck in 4 weeks.    Current Anticoagulation Instructions: INR: 2.2   Continue current coumadin dose of 3 tablets everyday.

## 2010-06-24 ENCOUNTER — Ambulatory Visit: Payer: Self-pay | Admitting: Internal Medicine

## 2010-06-30 ENCOUNTER — Encounter: Payer: Self-pay | Admitting: Cardiology

## 2010-06-30 DIAGNOSIS — Z8679 Personal history of other diseases of the circulatory system: Secondary | ICD-10-CM

## 2010-06-30 DIAGNOSIS — I4891 Unspecified atrial fibrillation: Secondary | ICD-10-CM

## 2010-07-04 ENCOUNTER — Encounter: Payer: Self-pay | Admitting: Cardiovascular Disease

## 2010-07-04 ENCOUNTER — Encounter (INDEPENDENT_AMBULATORY_CARE_PROVIDER_SITE_OTHER): Payer: Medicare Other

## 2010-07-04 DIAGNOSIS — I4891 Unspecified atrial fibrillation: Secondary | ICD-10-CM

## 2010-07-04 DIAGNOSIS — Z7901 Long term (current) use of anticoagulants: Secondary | ICD-10-CM

## 2010-07-07 ENCOUNTER — Encounter: Payer: Self-pay | Admitting: Internal Medicine

## 2010-07-10 NOTE — Medication Information (Signed)
Summary: rov/sp  Anticoagulant Therapy  Managed by: Freddrick March, RN, BSN PCP: Dr Cathlean Cower Supervising MD: Angelena Form MD, Harrell Gave Indication 1: Atrial Fibrillation (ICD-427.31) Lab Used: [facility] Site: [facility] INR POC 3.0 INR RANGE 2 - 3  Dietary changes: no    Health status changes: no    Bleeding/hemorrhagic complications: no    Recent/future hospitalizations: no    Any changes in medication regimen? no    Recent/future dental: no  Any missed doses?: no       Is patient compliant with meds? yes       Allergies: No Known Drug Allergies  Anticoagulation Management History:      The patient is taking warfarin and comes in today for a routine follow up visit.  Positive risk factors for bleeding include presence of serious comorbidities.  Negative risk factors for bleeding include an age less than 34 years old.  The bleeding index is 'intermediate risk'.  Positive CHADS2 values include History of CHF, History of HTN, and History of Diabetes.  Negative CHADS2 values include Age > 88 years old.  The start date was 05/10/2008.  His last INR was 1.4.  Anticoagulation responsible provider: Angelena Form MD, Harrell Gave.  INR POC: 3.0.  Cuvette Lot#: [id number].  Exp: 05/2011.    Anticoagulation Management Assessment/Plan:      The patient's current anticoagulation dose is Warfarin sodium 5 mg tabs: use as directed.  The target INR is 2 - 3.  The next INR is due 08/01/2010.  Anticoagulation instructions were given to patient.  Results were reviewed/authorized by Freddrick March, RN, BSN.  He was notified by Freddrick March RN.         Prior Anticoagulation Instructions: INR: 2.2   Continue current coumadin dose of 3 tablets everyday.  Current Anticoagulation Instructions: INR 3.0  Continue on same dosage 3 tablets daily.  Recheck in 4 weeks.

## 2010-07-15 NOTE — Letter (Signed)
Summary: Statement of Certifying Physician/James Crawford MD  Statement of Certifying Physician/James Crawford MD   Imported By: Phillis Knack 07/10/2010 08:26:25  _____________________________________________________________________  External Attachment:    Type:   Image     Comment:   External Document

## 2010-07-26 ENCOUNTER — Other Ambulatory Visit: Payer: Self-pay | Admitting: Cardiology

## 2010-07-31 NOTE — Telephone Encounter (Signed)
[address] °

## 2010-08-01 ENCOUNTER — Encounter: Payer: Medicare Other | Admitting: *Deleted

## 2010-08-05 ENCOUNTER — Ambulatory Visit (INDEPENDENT_AMBULATORY_CARE_PROVIDER_SITE_OTHER): Payer: Medicare Other | Admitting: *Deleted

## 2010-08-05 DIAGNOSIS — Z7901 Long term (current) use of anticoagulants: Secondary | ICD-10-CM

## 2010-08-05 DIAGNOSIS — Z8679 Personal history of other diseases of the circulatory system: Secondary | ICD-10-CM

## 2010-08-05 DIAGNOSIS — I4891 Unspecified atrial fibrillation: Secondary | ICD-10-CM

## 2010-08-05 LAB — POCT INR: INR: 2.4

## 2010-08-09 ENCOUNTER — Other Ambulatory Visit: Payer: Self-pay | Admitting: Cardiology

## 2010-08-11 ENCOUNTER — Other Ambulatory Visit: Payer: Self-pay | Admitting: Internal Medicine

## 2010-08-15 ENCOUNTER — Other Ambulatory Visit: Payer: Self-pay | Admitting: Internal Medicine

## 2010-08-22 ENCOUNTER — Encounter: Payer: Self-pay | Admitting: *Deleted

## 2010-08-22 ENCOUNTER — Encounter: Payer: Self-pay | Admitting: Internal Medicine

## 2010-08-25 ENCOUNTER — Encounter: Payer: Self-pay | Admitting: Internal Medicine

## 2010-08-25 ENCOUNTER — Ambulatory Visit (INDEPENDENT_AMBULATORY_CARE_PROVIDER_SITE_OTHER): Payer: Medicare Other | Admitting: Internal Medicine

## 2010-08-25 DIAGNOSIS — I509 Heart failure, unspecified: Secondary | ICD-10-CM

## 2010-08-25 DIAGNOSIS — I10 Essential (primary) hypertension: Secondary | ICD-10-CM

## 2010-08-25 DIAGNOSIS — I472 Ventricular tachycardia, unspecified: Secondary | ICD-10-CM

## 2010-08-25 DIAGNOSIS — Z95810 Presence of automatic (implantable) cardiac defibrillator: Secondary | ICD-10-CM

## 2010-08-25 NOTE — Assessment & Plan Note (Signed)
His symptoms remain class II. I've asked him to maintain a low-salt diet, take his medications, and increase his exercise regimen.

## 2010-08-25 NOTE — Assessment & Plan Note (Signed)
He had one nonsustained asymptomatic episode several weeks ago. This episode stopped spontaneously. The rate was nearly 300 beats per minute.

## 2010-08-25 NOTE — Patient Instructions (Signed)
Your physician wants you to follow-up in: 3 months with device clinic and 12 months with Dr Taylor You will receive a reminder letter in the mail two months in advance. If you don't receive a letter, please call our office to schedule the follow-up appointment.  

## 2010-08-25 NOTE — Progress Notes (Signed)
HPI [name] returns today for followup. He is a pleasant middle-aged man with a history of congestive heart failure, coronary disease, a nonischemic cardiomyopathy, status post ICD insertion. He has a history of ventricular tachycardia. Since his last visit, he has had no ICD discharges. He continues to work. He denies chest pain or shortness of breath. He states that he walks on his treadmill several times a week but only for 15 or 20 minutes. No Known Allergies   Current Outpatient Prescriptions  Medication Sig Dispense Refill  . allopurinol (ZYLOPRIM) 300 MG tablet Take 300 mg by mouth daily.        [name] amLODipine (NORVASC) 10 MG tablet TAKE 1 TABLET BY MOUTH EVERY DAY  30 tablet  7  . aspirin 81 MG tablet Take 81 mg by mouth daily.        [name] atorvastatin (LIPITOR) 40 MG tablet TAKE ONE TABLET BY MOUTH DAILY.  30 tablet  6  . carvedilol (COREG) 25 MG tablet Take 25 mg by mouth 2 (two) times daily with a meal.        . furosemide (LASIX) 80 MG tablet Take 80 mg by mouth 2 (two) times daily.        . hydrALAZINE (APRESOLINE) 25 MG tablet TAKE 1 TABLET 3 TIMES A DAY  90 tablet  0  . insulin glargine (LANTUS) 100 UNIT/ML injection 10 units daily       . isosorbide dinitrate (ISORDIL) 20 MG tablet Take 20 mg by mouth 3 (three) times daily.        . metFORMIN (GLUCOPHAGE) 500 MG tablet Take 500 mg by mouth 2 (two) times daily with a meal.        . Multiple Vitamin (MULTIVITAMIN) capsule Take 1 capsule by mouth daily.        . potassium chloride (KLOR-CON) 20 MEQ packet Take 20 mEq by mouth 3 (three) times daily.        . ramipril (ALTACE) 10 MG capsule Take 10 mg by mouth daily.        [name] warfarin (COUMADIN) 5 MG tablet USE AS DIRECTED  90 tablet  2  . B-D ULTRAFINE III SHORT PEN 31G X 8 MM MISC USE ONCE DAILY AS DIRECTED  100 each  1  . DISCONTD: pantoprazole (PROTONIX) 40 MG tablet Take 40 mg by mouth daily.        [name] DISCONTD: simvastatin (ZOCOR) 80 MG tablet Take 80 mg by mouth at bedtime.             Past Medical History  Diagnosis Date  . Acute gouty arthropathy   . OSA (obstructive sleep apnea)   . Hyperlipidemia   . CHF (congestive heart failure)      (EF 25%)  . Cerebrovascular accident   . Gout   . Hypokalemia   . HTN (hypertension)   . DM (diabetes mellitus)   . Lipoma   . Dyslipidemia   . Acute, but ill-defined, cerebrovascular disease   . CAD (coronary artery disease)     ROS:   All systems reviewed and negative except as noted in the HPI.   Past Surgical History  Procedure Date  . Cardiac catheterization     Nonobstructive coronary disease (catheterization in  2009 with left main calcified, LAD with luminal irregularities, circumflex with 60-70% PDA stenosis, the right coronary artery is nondominant with 60% stenosis)  . Cardiac defibrillator placement     ICD-St. Jude     No family  history on file.   History   Social History  . Marital Status: Married    Spouse Name: N/A    Number of Children: N/A  . Years of Education: N/A   Occupational History  . Not on file.   Social History Main Topics  . Smoking status: Former Smoker    Quit date: 05/04/1986  . Smokeless tobacco: Not on file  . Alcohol Use: Not on file  . Drug Use: Not on file  . Sexually Active: Not on file   Other Topics Concern  . Not on file   Social History Narrative  . No narrative on file     BP 160/110  Pulse 80  Ht 6' (1.829 m)  Wt 267 lb (121.11 kg)  BMI 36.21 kg/m2  Physical Exam:  Well appearing NAD HEENT: Unremarkable Neck:  No JVD, no thyromegally Lymphatics:  No adenopathy Back:  No CVA tenderness Lungs:  Clear. Well-healed ICD incision. HEART:  Regular rate rhythm, no murmurs, no rubs, no clicks Abd:  Flat, positive bowel sounds, no organomegally, no rebound, no guarding Ext:  2 plus pulses, no edema, no cyanosis, no clubbing Skin:  No rashes no nodules Neuro:  CN II through XII intact, motor grossly intact  DEVICE  Normal device function.   See PaceArt for details.   Assess/Plan:

## 2010-08-25 NOTE — Assessment & Plan Note (Signed)
His blood pressure is elevated today but he admits to not taking his medications this morning. I've asked him to go home and take his medications, maintain a low-sodium diet, and increase his exercise.

## 2010-09-04 ENCOUNTER — Encounter: Payer: Self-pay | Admitting: Internal Medicine

## 2010-09-04 ENCOUNTER — Ambulatory Visit (INDEPENDENT_AMBULATORY_CARE_PROVIDER_SITE_OTHER): Payer: Medicare Other | Admitting: *Deleted

## 2010-09-04 DIAGNOSIS — I4891 Unspecified atrial fibrillation: Secondary | ICD-10-CM

## 2010-09-04 DIAGNOSIS — Z8679 Personal history of other diseases of the circulatory system: Secondary | ICD-10-CM

## 2010-09-04 DIAGNOSIS — Z0000 Encounter for general adult medical examination without abnormal findings: Secondary | ICD-10-CM | POA: Insufficient documentation

## 2010-09-05 ENCOUNTER — Other Ambulatory Visit: Payer: Self-pay | Admitting: Internal Medicine

## 2010-09-08 ENCOUNTER — Encounter: Payer: Self-pay | Admitting: Internal Medicine

## 2010-09-08 ENCOUNTER — Ambulatory Visit (INDEPENDENT_AMBULATORY_CARE_PROVIDER_SITE_OTHER): Payer: Medicare Other | Admitting: Internal Medicine

## 2010-09-08 ENCOUNTER — Other Ambulatory Visit (INDEPENDENT_AMBULATORY_CARE_PROVIDER_SITE_OTHER): Payer: Medicare Other

## 2010-09-08 VITALS — BP 102/62 | HR 75 | Temp 97.6°F | Ht 72.0 in | Wt 270.4 lb

## 2010-09-08 DIAGNOSIS — E119 Type 2 diabetes mellitus without complications: Secondary | ICD-10-CM

## 2010-09-08 DIAGNOSIS — M109 Gout, unspecified: Secondary | ICD-10-CM

## 2010-09-08 DIAGNOSIS — I4891 Unspecified atrial fibrillation: Secondary | ICD-10-CM

## 2010-09-08 DIAGNOSIS — Z8679 Personal history of other diseases of the circulatory system: Secondary | ICD-10-CM

## 2010-09-08 DIAGNOSIS — Z0000 Encounter for general adult medical examination without abnormal findings: Secondary | ICD-10-CM

## 2010-09-08 DIAGNOSIS — Z125 Encounter for screening for malignant neoplasm of prostate: Secondary | ICD-10-CM

## 2010-09-08 LAB — URINALYSIS, ROUTINE W REFLEX MICROSCOPIC
Nitrite: NEGATIVE
Specific Gravity, Urine: 1.015 (ref 1.000–1.030)
Urine Glucose: NEGATIVE
pH: 5.5 (ref 5.0–8.0)

## 2010-09-08 LAB — CBC WITH DIFFERENTIAL/PLATELET
Basophils Relative: 0.2 % (ref 0.0–3.0)
Eosinophils Relative: 1.1 % (ref 0.0–5.0)
Lymphocytes Relative: 19.9 % (ref 12.0–46.0)
Monocytes Absolute: 0.5 10*3/uL (ref 0.1–1.0)
Neutrophils Relative %: 70.5 % (ref 43.0–77.0)
Platelets: 167 10*3/uL (ref 150.0–400.0)
RBC: 4.08 Mil/uL — ABNORMAL LOW (ref 4.22–5.81)
WBC: 5.6 10*3/uL (ref 4.5–10.5)

## 2010-09-08 LAB — HEPATIC FUNCTION PANEL
ALT: 20 U/L (ref 0–53)
Alkaline Phosphatase: 57 U/L (ref 39–117)
Bilirubin, Direct: 0.2 mg/dL (ref 0.0–0.3)
Total Bilirubin: 1.4 mg/dL — ABNORMAL HIGH (ref 0.3–1.2)
Total Protein: 6.4 g/dL (ref 6.0–8.3)

## 2010-09-08 LAB — BASIC METABOLIC PANEL
GFR: 101.17 mL/min (ref >60.00)
Potassium: 3.6 mEq/L (ref 3.5–5.1)
Sodium: 142 mEq/L (ref 135–145)

## 2010-09-08 LAB — LIPID PANEL
Cholesterol: 156 mg/dL (ref 0–200)
HDL: 35.6 mg/dL — ABNORMAL LOW (ref >39.00)
Triglycerides: 68 mg/dL (ref 0.0–149.0)
VLDL: 13.6 mg/dL (ref 0.0–40.0)

## 2010-09-08 LAB — TSH: TSH: 1.16 u[IU]/mL (ref 0.35–5.50)

## 2010-09-08 LAB — HEMOGLOBIN A1C: Hgb A1c MFr Bld: 5.9 % (ref 4.6–6.5)

## 2010-09-08 LAB — MICROALBUMIN / CREATININE URINE RATIO: Microalb Creat Ratio: 2.4 mg/g (ref 0.0–30.0)

## 2010-09-08 MED ORDER — RAMIPRIL 10 MG PO CAPS: 10.0000 mg | ORAL_CAPSULE | Freq: Two times a day (BID) | ORAL | Status: DC

## 2010-09-08 NOTE — Assessment & Plan Note (Signed)
stable overall by hx and exam, most recent lab reviewed with pt, and pt to continue medical treatment as before  To check uric acid - ? Need to re-start the allopurinol

## 2010-09-08 NOTE — Assessment & Plan Note (Signed)

## 2010-09-08 NOTE — Progress Notes (Signed)
Quick Note:  Voice message left on [employer] system - lab is negative, normal or otherwise stable, pt to continue same tx ______ 

## 2010-09-08 NOTE — Assessment & Plan Note (Signed)
stable overall by hx and exam, most recent lab reviewed with pt, and pt to continue medical treatment as before  Lab Results  Component Value Date   HGBA1C 6.0 01/09/2010

## 2010-09-08 NOTE — Patient Instructions (Signed)
Continue all other medications as before Please keep your appointments with your specialists as you normally do Please return in 6 mo with Lab testing done 3-5 days before

## 2010-09-08 NOTE — Progress Notes (Signed)
Subjective:    Patient ID: [name], male    DOB: 12/20/1955, 55 y.o.   MRN: [id number]  HPI  Here for wellness and f/u;  Overall doing ok;  Pt denies CP, worsening SOB, DOE, wheezing, orthopnea, PND, worsening LE edema, palpitations, dizziness or syncope.  Pt denies neurological change such as new Headache, facial or extremity weakness.  Pt denies polydipsia, polyuria, or low sugar symptoms. Pt states overall good compliance with treatment and medications, good tolerability, and trying to follow lower cholesterol diet.  Pt denies worsening depressive symptoms, suicidal ideation or panic. No fever, wt loss, night sweats, loss of appetite, or other constitutional symptoms.  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 No gout symptoms lately, not taking the allopurinol  Past Medical History  Diagnosis Date  . Acute gouty arthropathy   . OSA (obstructive sleep apnea)   . Hyperlipidemia   . CHF (congestive heart failure)      (EF 25%)  . Cerebrovascular accident   . Gout   . Hypokalemia   . HTN (hypertension)   . DM (diabetes mellitus)   . Lipoma   . Dyslipidemia   . Acute, but ill-defined, cerebrovascular disease   . CAD (coronary artery disease)    Past Surgical History  Procedure Date  . Cardiac catheterization     Nonobstructive coronary disease (catheterization in  2009 with left main calcified, LAD with luminal irregularities, circumflex with 60-70% PDA stenosis, the right coronary artery is nondominant with 60% stenosis)  . Cardiac defibrillator placement     ICD-St. Jude    reports that he quit smoking about 24 years ago. He does not have any smokeless tobacco history on file. He reports that he  drinks alcohol. His drug history not on file. family history is not on file. No Known Allergies Current Outpatient Prescriptions on File Prior to Visit  Medication Sig Dispense Refill  . allopurinol (ZYLOPRIM) 300 MG tablet TAKE 1 TABLET BY MOUTH EVERY DAY  30 tablet  3  . amLODipine (NORVASC) 10 MG tablet TAKE 1 TABLET BY MOUTH EVERY DAY  30 tablet  7  . aspirin 81 MG tablet Take 81 mg by mouth daily.        [name] atorvastatin (LIPITOR) 40 MG tablet TAKE ONE TABLET BY MOUTH DAILY.  30 tablet  6  . B-D ULTRAFINE III SHORT PEN 31G X 8 MM MISC USE ONCE DAILY AS DIRECTED  100 each  1  . carvedilol (COREG) 25 MG tablet Take 25 mg by mouth 2 (two) times daily with a meal.        . furosemide (LASIX) 80 MG tablet Take 80 mg by mouth 2 (two) times daily.        . hydrALAZINE (APRESOLINE) 25 MG tablet TAKE 1 TABLET 3 TIMES A DAY  90 tablet  0  . insulin glargine (LANTUS) 100 UNIT/ML injection 10 units daily       . isosorbide dinitrate (ISORDIL) 20 MG tablet Take 20 mg by mouth 3 (three) times daily.        . metFORMIN (GLUCOPHAGE) 500 MG tablet Take 500 mg by mouth 2 (two) times daily with a meal.        .  Multiple Vitamin (MULTIVITAMIN) capsule Take 1 capsule by mouth daily.        . potassium chloride (KLOR-CON) 20 MEQ packet Take 20 mEq by mouth 3 (three) times daily.        . ramipril (ALTACE) 10 MG capsule Take 10 mg by mouth daily.        [name] warfarin (COUMADIN) 5 MG tablet USE AS DIRECTED  90 tablet  2   Review of Systems Review of Systems  Constitutional: Negative for diaphoresis, activity change, appetite change and unexpected weight change.  HENT: Negative for hearing loss, ear pain, facial swelling, mouth sores and neck stiffness.   Eyes: Negative for pain, redness and visual disturbance.  Respiratory: Negative for shortness of breath and wheezing.   Cardiovascular: Negative for chest pain and palpitations.  Gastrointestinal: Negative for diarrhea, blood in stool, abdominal distention and  rectal pain.  Genitourinary: Negative for hematuria, flank pain and decreased urine volume.  Musculoskeletal: Negative for myalgias and joint swelling.  Skin: Negative for color change and wound.  Neurological: Negative for syncope and numbness.  Hematological: Negative for adenopathy.  Psychiatric/Behavioral: Negative for hallucinations, self-injury, decreased concentration and agitation.      Objective:   Physical Exam BP 102/62  Pulse 75  Temp(Src) 97.6 F (36.4 C) (Oral)  Ht 6' (1.829 m)  Wt 270 lb 6 oz (122.641 kg)  BMI 36.67 kg/m2  SpO2 93% Physical Exam  VS noted Constitutional: Pt is oriented to person, place, and time. Appears well-developed and well-nourished.  HENT:  Head: Normocephalic and atraumatic.  Right Ear: External ear normal.  Left Ear: External ear normal.  Nose: Nose normal.  Mouth/Throat: Oropharynx is clear and moist.  Eyes: Conjunctivae and EOM are normal. Pupils are equal, round, and reactive to light.  Neck: Normal range of motion. Neck supple. No JVD present. No tracheal deviation present.  Cardiovascular: Normal rate, regular rhythm, normal heart sounds and intact distal pulses.   Pulmonary/Chest: Effort normal and breath sounds normal.  Abdominal: Soft. Bowel sounds are normal. There is no tenderness.  Musculoskeletal: Normal range of motion. Exhibits no edema.  Lymphadenopathy:  Has no cervical adenopathy.  Neurological: Pt is alert and oriented to person, place, and time. Pt has normal reflexes. No cranial nerve deficit.  Skin: Skin is warm and dry. No rash noted.  Psychiatric:  Has  normal mood and affect. Behavior is normal.        Assessment & Plan:

## 2010-09-16 NOTE — Assessment & Plan Note (Signed)
[facility]                         GASTROENTEROLOGY OFFICE NOTE   NAME:[name]                       MRN:          [id number]  DATE:01/05/2007                            DOB:          Sep 06, 1955    REFERRING PHYSICIAN:  Sandy Salaam. Shawna Orleans, DO   REASON FOR REFERRAL:  Dr. Shawna Orleans asked me to evaluate [name] in  consultation regarding colorectal cancer screening with colonoscopy.  The patient is on Plavix.   HISTORY OF PRESENT ILLNESS:  [name] is a very pleasant 56 year old man  who has nonischemic cardiomyopathy and had a stroke in 2000.  Since that  stroke, he was on Coumadin for several years and then was more recently  transitioned to Plavix for the past 2-3 years.  He has not been off the  Plavix for any reason since then.  He has no overt GI bleeding for  years, although 8-10 years ago, he did have some minor rectal bleeding  that was attributed to hemorrhoids.  He has no constipation or diarrhea.   REVIEW OF SYSTEMS:  Notable for stable weight and is otherwise  essentially normal and is available on the nursing intake sheet.   PAST MEDICAL HISTORY:  1. Nonischemic cardiomyopathy with an ejection fraction of      approximately 40% most recently.  2. Hypertension.  3. Diabetes, on insulin.  4. Elevated cholesterol, status post CVA without any residual      weaknesses, 2000.  5. Sleep apnea.  6. Angioplasty in the past.  I do not see records of this in review of      his chart, even in his recent cardiology notes.  7. Obesity.   CURRENT MEDICINES:  Metformin, isosorbide, hydralazine, Klor-Con, Lasix,  Plavix, Protonix, Coreg, multivitamin, Atacand, Vytorin, Altace with  Norvasc, Lantus.   ALLERGIES:  No known drug allergies.   SOCIAL HISTORY:  Married, 2 children, works as a Sports coach, nonsmoker,  drinks rarely.   FAMILY HISTORY:  No colon cancer, colon polyps in the family.   PHYSICAL EXAM:  5 feet 11 inches, 262 pounds, blood  pressure 116/80,  pulse 80.  CONSTITUTIONAL:  General well-appearing.  NEUROLOGIC:  Alert and oriented x3.  EYES:  Extraocular movements intact.  MOUTH:  Oropharynx moist, no lesions.  NECK:  Supple, no lymphadenopathy.  CARDIOVASCULAR:  Heart regular rate and rhythm.  LUNGS:  Clear to auscultation bilaterally.  ABDOMEN:  Soft, nontender, nondistended, normal bowel sounds.  EXTREMITIES:  No lower extremity edema.  SKIN:  No rashes or lesions that are visible.   ASSESSMENT/PLAN:  A 55 year old man at routine risk for colorectal  cancer, at elevated risk for complications given ongoing Plavix usage.   We will contact Dr. Lora [name] office about holding his Plavix for 7 days  prior to colonoscopy.  If that is not feasible, then I would proceed  with colonoscopy with him on Plavix, as the odds are in his favor that  he would not have any polyps that would need to be removed.  I see no  reason for any blood test or imaging studies.  Darius Banister, MD  Electronically Signed    DPJ/MedQ  DD: 01/05/2007  DT: 01/05/2007  Job #: [id number]   cc:   Sandy Salaam. Shawna Orleans, DO

## 2010-09-16 NOTE — Procedures (Signed)
NAMEESAW, DIEL NO.:  [id number]   MEDICAL RECORD NO.:  [id number]          PATIENT TYPE:  OUT   LOCATION:  SLEEP CENTER                 FACILITY:  [facility]   PHYSICIAN:  Kathee Delton, MD,FCCPDATE OF BIRTH:  17-Mar-1956   DATE OF STUDY:  02/24/2008                            NOCTURNAL POLYSOMNOGRAM   REFERRING PHYSICIAN:  Minus Breeding, MD, [facility]   REFERRING PHYSICIAN:  Minus Breeding, MD, [facility]   INDICATION FOR STUDY:  Hypersomnia with sleep apnea.   EPWORTH SLEEPINESS SCORE:  3.   MEDICATIONS:   SLEEP ARCHITECTURE:  The patient had a total sleep time of 328 minutes  during the entire night, with no slow wave sleep and decreased REM  throughout.  Sleep onset latency was normal at 24 minutes, and REM onset  was normal at 75 minutes.  Sleep efficiency was poor at 77%.   RESPIRATORY DATA:  The patient underwent a split-night study where he  was found to have 36 obstructive events in the first 122 minutes of  sleep.  This gave him an AHI of 18 events per hour during the diagnostic  portion of the study.  The events were increased in frequency during  REM, and moderate snoring was noted throughout.  By protocol, the  patient was then fitted with a large ResMed Quattro full face mask, and  ultimately titrated to a final pressure of 10 cm of water.  This  resulted in excellent control of both his obstructive events and  snoring.   OXYGEN DATA:  There was O2 desaturation as low as 85% with the patient's  obstructive events.  Adequate saturations were maintained on optimal  CPAP pressure.   CARDIAC DATA:  Occasional PVCs were noted with no clinically significant  arrhythmias seen.   MOVEMENT-PARASOMNIA:  No leg jerks or abnormal behaviors were noted.   IMPRESSIONS-RECOMMENDATIONS:  1. Split-night study reveals mild obstructive sleep apnea with an      apnea-hypopnea index of 18 events per hour during the diagnostic      portion of the study, and oxygen  desaturation as low as 85%.  He      was then placed on continuous positive airway pressure with a large      ResMed Quattro full face mask and titrated to a level of 10 cm of      water pressure with excellent response.  The patient should also be      encouraged to work aggressively on      weight loss.  2. Occasional premature ventricular contractions, but no clinically      significant arrhythmias were seen.      Kathee Delton, MD,FCCP  Diplomate, [place] Board of Sleep  Medicine  Electronically Signed     KMC/MEDQ  D:  03/06/2008 15:49:31  T:  03/07/2008 [id number]  Job:  [id number]

## 2010-09-16 NOTE — Assessment & Plan Note (Signed)
[facility]                         ELECTROPHYSIOLOGY OFFICE NOTE   NAME:WEBBDonie, Estis                       MRN:          [id number]  DATE:12/15/2006                            DOB:          [birth year]    Mr. Toribio returns today for follow-up.  He is a very pleasant middle-aged  male with a nonischemic cardiomyopathy and congestive heart failure,  status post ICD insertion.  He returns today for follow-up.  In the  interim a year out now from his device implant, he has had no  intercurrent ICD therapies.  His heart failure is class II.  He denies  chest pain.  Overall he has done well.  He has very minimal soreness at  the ICD insertion site.   MEDICATIONS:  1. Metformin.  2. Isosorbide.  3. Hydralazine 75 mg t.i.d.  4. Lasix 80 mg b.i.d.  5. Plavix 75 mg daily.  6. Protonix 40 mg a day.  7. Coreg 25 mg two tablets twice daily.  8. Atacand 32 mg daily.  9. Vytorin 10/40 mg.  10.Altace 10 mg twice daily.  11.Norvasc 5 mg daily.  12.Potassium.   On exam, he is a pleasant, well-appearing middle-aged man in no acute  distress.  His blood pressure today was 150/100 and pulse 68 and regular,  respirations were 18.  The weight was 265 pounds.  NECK:  No jugular venous distention.  LUNGS:  Clear bilaterally to auscultation.  No wheezes, rales or rhonchi  were present.  CARDIOVASCULAR:  A regular rate and rhythm with normal S1 and S2.  There  was a soft S4 gallop present.  EXTREMITIES:  No edema.   Interrogation of his defibrillator demonstrates a [place] [phone]  with R waves greater than 12, the impedance 440 Ohms, threshold 0.75 at  0.5, the battery voltage is 3.125 V.   IMPRESSION:  1. Nonischemic cardiomyopathy.  2. Congestive heart failure.  3. Hypertension.  4. Status post implantable cardioverter-defibrillator insertion.   DISCUSSION:  Overall, Mr. Fluckiger is stable and his defibrillator is  working normally.  His blood pressure  is high today but he notes he has  not taken his morning medications.  I have instructed  him to maintain a low-sodium diet.  He is to exercise regularly and I  will plan to see him back for ICD follow-up in a year.     Champ Mungo. Lovena Le, MD  Electronically Signed    GWT/MedQ  DD: 12/15/2006  DT: 12/16/2006  Job #: [id number]

## 2010-09-16 NOTE — Assessment & Plan Note (Signed)
[facility]                            CARDIOLOGY OFFICE NOTE   NAME:[name], [name]                       MRN:          [id number]  DATE:06/15/2007                            DOB:          [birth year]    PRIMARY CARE PHYSICIAN:  Sandy Salaam. Shawna Orleans, DO   REASON FOR PRESENTATION:  Evaluate patient with cardiomyopathy.   HISTORY OF PRESENT ILLNESS:  The patient presents for evaluation of the  above.  He is 55 years old.  He has a nonischemic cardiomyopathy.  From  a cardiovascular standpoint he has done relatively well.  He was unable  to afford Atacand.  He has been off that medication.  He says his  breathing is okay.  Has had no acute exacerbations of shortness of  breath.  Denies any PND or orthopnea.  He has not had any palpitation,  pre-syncope or syncope.  He has had no chest discomfort.  He has had no  lower extremity swelling.  He says he has some good days and bad days.  He thinks he might be depressed.  Of note, he does complain that the  lump on his back is getting bigger.   PAST MEDICAL HISTORY:  1. Congestive heart failure, nonischemic (ejection fraction had been      25%. It improved to 40-45% in late 2007).  2. Hypertension.  3. Insulin dependent diabetes mellitus.  4. Dyslipidemia.  5. Mild obstructive sleep apnea, not treated with continuous positive      airway pressure.  6. History of cerebral vascular accident.  7. Previous tobacco use.  8. Gout.  9. Obesity.   ALLERGIES:  NO KNOWN DRUG ALLERGIES.   MEDICATIONS:  1. Metformin 500 mg b.i.d.  2. Isosorbide 40 mg t.i.d.  3. Hydralazine 75 mg t.i.d.  4. Lasix 80 mg b.i.d.  5. Plavix 75 mg daily.  6. Protonix 40 mg daily.  7. Carvedilol 50 mg b.i.d.  8. Multivitamin.  9. Vytorin.  10.Altace 10 mg b.i.d.  11.Norvasc 5 mg daily.  12.Lantus.  13.Klor-Con 40 mEq t.i.d.   REVIEW OF SYSTEMS:  As stated in the HPI, otherwise negative other  systems.   PHYSICAL EXAMINATION:  The  patient is in no distress.  Blood pressure  142/90, heart rate 82 and regular, weight 267 pounds, body mass index is  35.  HEENT: Eyelids unremarkable. Pupils equal, round, and reactive to light.  Fundi not visualized. Oral mucosa is unremarkable.  NECK: No jugular venous distention at 45 degrees. Carotid upstroke brisk  and symmetrical. No bruits, no thyromegaly.  LYMPHATICS: No adenopathy.  LUNGS: Clear to auscultation bilaterally.  BACK: No costovertebral angle tenderness.  CHEST: Well-healed ICD pocket.  HEART: PMI not displaced or sustained. S1, S2 within normal limits. No  S3. No S4. No clicks, rub or murmurs.  ABDOMEN: Obese, positive bowel sounds, normal in frequency and pitch. No  bruits. No rebounds. No guarding. No midline pulsatile mass. No  organomegaly.  SKIN: No rashes, large lipoma on the right back.  NEURO: Oriented to person, place and time. Cranial nerves II-XII grossly  intact.  Motor grossly intact.   EKG: Sinus rhythm, rate 82, axis within normal limits, mild QT  prolongation, left atrial enlargement, nonspecific lateral T-wave  inversions, probable repolarization changes.   ASSESSMENT/PLAN:  1. Cardiomyopathy.  The patient's cardiomyopathy, possibly related to      hypertension.  His blood pressure is much better controlled.      Because of the results of the ON TARGET study, I will not add ARB      back.  Rather I would have him lose weight to make sure the blood      pressure stays at target.  Think he is doing well with the regimen      as listed.  2. Obesity.  We had a discussion about this.  He needs to lose weight      with diet, exercise and I will give him a referral to a dietician.  3. Lipoma. He has this on his back and it is getting larger.  I have      given a name of a general surgeon to call see about having this      removed.  4. Hypertension, is managed as above.  5. Follow-up. See the patient back again in 6 months or sooner if       needed.     Minus Breeding, MD, [facility]  Electronically Signed    JH/MedQ  DD: 06/15/2007  DT: 06/16/2007  Job #: [id number]   cc:   Sandy Salaam. Shawna Orleans, DO

## 2010-09-16 NOTE — Assessment & Plan Note (Signed)
[place]                         ELECTROPHYSIOLOGY OFFICE NOTE   NAME:WEBBRayshard, Abbatiello                       MRN:          [id number]  DATE:03/21/2007                            DOB:          05/02/1956    SUBJECTIVE:  [name] was seen today at the [facility] for followup  of his [place] implantable cardioverter defibrillator, model #V-  193, implanted on December 18, 2005, for non-ischemic cardiomyopathy.   Interrogation of his device demonstrates R-waves of greater than 12 mV,  RV impedance of 450 ohms and a threshold of 0.75 volts at 0.5 msec.  His  shock impedance was 37 ohms.  His battery voltage was 3.15 volts with a  charge time of 10 seconds.  He was in sinus rhythm today with PVCs.  He  is not pacemaker-dependent.  He had two episodes of ventricular  arrhythmia with aborted therapy, secondary to a return to sinus rhythm.  He is currently programmed with a VT-1 zone of 188 beats per minute and  a VT zone of 214 beats per minute and a VF zone of  240 beats per  minute.  He did state that he has missed some of his potassium  medications about one week ago when he ran out, which corresponds with  the episodes of arrhythmias.  He is currently back on his medication.   He asked for a flu shot today, which was given.  He is also out of his  Atacand 32 mg, and so a prescription was called in today to [facility] on  [address] for that medication.   FOLLOWUP:  Mr. Estridge will return to the clinic in three months' time for  re-evaluation of his device.      Chanetta Marshall, RN,BSN  Electronically Signed      Champ Mungo. Lovena Le, MD  Electronically Signed   AS/MedQ  DD: 03/21/2007  DT: 03/22/2007  Job #: [id number]

## 2010-09-16 NOTE — Op Note (Signed)
NAMEAURORA, [name]                ACCOUNT NO.:  [id number]   MEDICAL RECORD NO.:  [id number]          PATIENT TYPE:  AMB   LOCATION:  SDS                          FACILITY:  [facility]   PHYSICIAN:  Odis Hollingshead, M.D.DATE OF BIRTH:  05/09/1955   DATE OF PROCEDURE:  09/20/2007  DATE OF DISCHARGE:  09/20/2007                               OPERATIVE REPORT   PREOPERATIVE DIAGNOSIS:  Soft tissue mass on the back.   POSTOPERATIVE DIAGNOSIS:  13.5 x 9.5 soft tissue mass on back.   PROCEDURE:  Excision of soft tissue mass on back.   SURGEON:  Odis Hollingshead, MD.   ANESTHESIA:  General plus Marcaine local.   INDICATIONS:  This 55 year old male who has had a soft tissue mass on  his upper back which has been growing over time.  It continues to grow  and he came to the office requesting removal.  He has AICD and has some  medical issues, but he has been seen by Dr. Percival Spanish and cleared for  surgery.  He presents for the procedure.   TECHNIQUE:  He was seen in the holding area.  The AICD was deactivated.  He was then brought to the operating room on the stretcher, given a  general anesthetic, and placed prone on the operating table with padding  placed in appropriate pressure points.  The mass in the right upper back  was sterilely prepped and draped.  A transverse incision was made over  the mesh to the skin and the subcutaneous tissue and under the capsule  the mass.  It appeared to be a lipoma.  Using electrocautery and careful  blunt dissection, I was able to excise the mass intact and then measured  it.  It measured 13.5 x 9.5 cm.  It did not appear to go submuscular.   Following this, I evaluated the area for bleeding and controlled with  electrocautery.  I then injected Marcaine in the subdermal and  subcutaneous area.   The wound was then closed in two layers.  The subcutaneous tissue was  closed with running 2-0 Vicryl suture and the skin was closed with 3-0  Monocryl  subcuticular stitch.  Steri-Strips and sterile dressings were  applied.   He tolerated the procedure without apparent complications and was taken  to recovery in satisfactory condition.      Odis Hollingshead, M.D.  Electronically Signed    TJR/MEDQ  D:  09/20/2007  T:  09/21/2007  Job:  [id number]   cc:   Sandy Salaam. Shawna Orleans, DO  Minus Breeding, MD, [facility]

## 2010-09-16 NOTE — Assessment & Plan Note (Signed)
[name]                            CARDIOLOGY OFFICE NOTE   NAME:WEBBRaven, [name]                       MRN:          [id number]  DATE:05/10/2008                            DOB:          06-Mar-1956    PRIMARY CARE PHYSICIAN:  Biagio Borg, MD   REASON FOR PRESENTATION:  Evaluate the patient with cardiomyopathy.   HISTORY OF PRESENT ILLNESS:  The patient presents for followup of the  above.  He is 55 years old.  He has done well since I last saw him.  He  has had no acute shortness of breath and denies any PND or orthopnea.  He has had no palpitations, presyncope, or syncope.  He has had no chest  discomfort, neck or arm discomfort.  He did have a sleep study, which  showed mild worsening of his sleep apnea and he is due to see Dr.  Gwenette Greet.  He wants to also go start working out at a gym.   Of note, today's EKG demonstrates atrial fibrillation, which is a new  dysrhythmia for him.  He states he is not feeling any palpitations.  He  is not having any presyncope or syncope.  He is not having any decreased  exercise tolerance or increased fatigue.  He is not having any chest  discomfort, neck or arm discomfort.   PAST MEDICAL HISTORY:  Congestive heart failure, nonischemic (EF  approximately 25%), nonobstructive coronary disease (catheterization in  2009 with left main calcified, LAD with luminal irregularities,  circumflex with 60-70% PDA stenosis, the right coronary artery is  nondominant with 60% stenosis), hypertension, sleep apnea, insulin-  dependent diabetes mellitus, dyslipidemia, cerebrovascular accident,  previous tobacco use, gout, obesity, and status post [employer]  implantable defibrillator  placement.   ALLERGIES:  None.   MEDICATIONS:  1. Isosorbide 20 mg t.i.d.  2. Simvastatin 40 mg daily.  3. Klor-Con 60 mEq b.i.d.  4. Lantus.  5. Norvasc 5 mg daily.  6. Altace 10 mg b.i.d.  7. Multivitamin.  8. Coreg 50 mg b.i.d.  9.  Protonix 40 mg daily.  10.Plavix 75 mg daily.  11.Lasix 80 mg b.i.d.  12.Hydralazine 75 mg t.i.d.  13.Metformin 500 mg b.i.d.   REVIEW OF SYSTEMS:  As stated in the HPI, and otherwise, negative for  other systems.   PHYSICAL EXAMINATION:  GENERAL:  The patient is pleasant and in no  distress.  VITAL SIGNS:  Blood pressure 112/74, heart rate 81 and irregular, and  weight 258 pounds.  HEENT:  Eyes unremarkable; pupils are equal, round, and reactive to  light; fundi not visualized; oral mucosa unremarkable.  NECK:  No jugular venous distention at 45 degrees, carotid upstroke  brisk and symmetric, no bruits, no thyromegaly.  LYMPHATICS:  No cervical, axillary, or inguinal adenopathy.  LUNGS:  Clear to auscultation bilaterally.  BACK:  No costovertebral angle tenderness.  CHEST:  Well-healed ICD pocket.  HEART:  PMI not displaced or sustained, S1 and S2 within normal limits,  no S3, no S4, no clicks, no rubs, no murmurs.  ABDOMEN:  Obese, positive bowel  sounds normal in frequency and pitch, no  bruits, no rebound, no guarding, no midline pulsatile mass, no  hepatomegaly, no splenomegaly.  SKIN:  No rashes, no nodules.  EXTREMITIES:  2+ pulse throughout, no edema, no cyanosis, no clubbing.  NEURO:  Oriented to person, place, and time; cranial nerves II through  XII grossly intact; motor grossly intact.   ASSESSMENT AND PLAN:  1. Atrial fibrillation.  This is a new finding on the patient.  He is      not really feeling this.  He seems to have a reasonable rate      control.  At this point, I am going to start Coumadin.  We may      consider cardioversion in the future after he has been therapeutic      on his anticoagulation.  He will continue on the other meds as      listed.  After he has taken his Coumadin for about 3 days, he is      going to stop his Plavix.  I am going to reduce his aspirin to 81      mg daily.  He has had Coumadin in the past.  We are giving him      extensive  Coumadin education and education about atrial      fibrillation.  He understands the risks and benefits.  2. Hypertension.  His blood pressure is well controlled and he will      obtain the meds as listed.  3. Cardiomyopathy.  We will continue with the meds as listed.  He      seems to have class I symptoms.  4. Status post implantable cardioverter-defibrillator.  He is up-to-      date followup on this and will continued to be followed in the      [facility].  5. Sleep apnea.  He is due to see Dr. Gwenette Greet and I suspect he will get      CPAP.  6. Diabetes per Dr. Jenny Reichmann.  Since I am going to have him back for BMET,      I will go ahead and take a hemoglobin A1c when I get these fasting      labs.  I will also check a lipid profile.  7. Dyslipidemia.  This will be checked as above.  8. Followup.  I am going to see him back in about 2 months or sooner      if needed.     Minus Breeding, MD, [facility]  Electronically Signed    JH/MedQ  DD: 05/10/2008  DT: 05/11/2008  Job #: [id number]   cc:   Biagio Borg, MD

## 2010-09-16 NOTE — Assessment & Plan Note (Signed)
[place]                            CARDIOLOGY OFFICE NOTE   NAME:WEBBJohnathn, Scherzer                       MRN:          [id number]  DATE:12/09/2007                            DOB:          Jan 04, 1956    PRIMARY CARE PHYSICIAN:  Biagio Borg, MD   REASON FOR PRESENTATION:  Evaluate the patient with cardiomyopathy.   HISTORY OF PRESENT ILLNESS:  The patient presents for followup of the  above.  He is 55 years old.  Since I last saw him, he has done well from  a cardiovascular standpoint.  He had a large lipoma removed from his  back.  He had no problems from a cardiovascular standpoint with this.  He has had no new shortness of breath.  Denies any PND or orthopnea.  He  has had no chest pain.  He has lost about 6 pounds.   PAST MEDICAL HISTORY:  Congestive heart failure (Nonischemic.  His EF  had been 25% improved to 40-45% in late 2007), hypertension, insulin-  dependant diabetes mellitus, dyslipidemia, mild obstructive sleep apnea  treated with continuous positive airway pressure, history of  cerebrovascular accident, previous tobacco use, gout, and obesity.   ALLERGIES:  None.   MEDICATIONS:  1. Metformin 500 mg b.i.d.  2. Lasix 80 mg b.i.d.  3. Plavix 75 mg daily.  4. Protonix 40 mg daily.  5. Coreg 50 mg b.i.d.  6. Multivitamin.  7. Altace 10 mg b.i.d.  8. Norvasc 5 mg daily.  9. Lantus.  10.Potassium 60 mEq t.i.d.  11.Vytorin 10/80.   REVIEW OF SYSTEMS:  As stated in the HPI and positive for headaches  related to his isosorbide.   PHYSICAL EXAMINATION:  GENERAL:  The patient is pleasant and in no  distress.  VITAL SIGNS:  Blood pressure 129/93, heart rate 60 and regular, weight  260 pounds, and body mass index 35.  NECK:  No jugular venous distention at 45 degrees; carotid upstroke  brisk and symmetric; no bruits, no thyromegaly.  LUNGS:  Clear to auscultation bilaterally.  CHEST:  Well-healed ICD pocket.  HEART:  PMI not  displaced or sustained; S1 and S2 within normal limits;  no S3, no S4, and no murmurs.  ABDOMEN:  Flat; positive bowel sounds, normal in frequency and pitch; no  bruits, rebound, guarding or midline pulsatile mass; no hepatomegaly, no  splenomegaly.  SKIN:  No rashes, no nodules.  EXTREMITIES:  Pulses 2+, no edema.   ASSESSMENT AND PLAN:  1. Cardiomyopathy.  Repeat an echocardiogram as it has been 2 years to      make sure his ejection fraction is still at the most recent level      of 45% or possibly even better.  At this point, I am going to have      to reduce his isosorbide as he is having significant headaches.  I      will see if 20 mg t.i.d. helps.  He has been instructed to take an      aspirin before the first morning dose.  2. Hypertension.  His diastolic  blood pressure is very slightly      elevated.  I would like him to lose more weight to try to control      this.  He will continue on the other medicines as listed.  3. Status post implantable cardioverter-defibrillator.  He will get      routine followup in the [facility].  4. Dyslipidemia.  He is now being followed by Dr. Jenny Reichmann.  5. Followup.  I will see him back in 6 months.     Minus Breeding, MD, [facility]  Electronically Signed    JH/MedQ  DD: 12/09/2007  DT: 12/09/2007  Job #: [id number]   cc:   Biagio Borg, MD

## 2010-09-16 NOTE — Assessment & Plan Note (Signed)
[place]                         ELECTROPHYSIOLOGY OFFICE NOTE   NAME:WEBBKnute, Mela                       MRN:          [id number]  DATE:12/23/2007                            DOB:          Aug 11, 1955    HISTORY OF PRESENT ILLNESS:  [name] returns today for followup.  He is  a very pleasant middle-aged male with a nonischemic cardiomyopathy and  congestive heart failure status post prophylactic ICD insertion.  He has  hypertension.  He has diabetes.  He had no specific complaints today.  He does have a history of mild sleep apnea, though this has been stable.  He has CPAP.   PRESENT MEDICATIONS:  1. Metformin 500 twice daily.  2. Lasix 80 twice daily.  3. Plavix 75 a day.  4. Protonix 40 a day.  5. Coreg 50 twice a day.  6. Multiple vitamins.  7. Altace 10 twice a day.  8. Norvasc 5 a day.  9. Lantus insulin.  10.Potassium 60 t.i.d.  11.Vytorin 10/80.   PHYSICAL EXAMINATION:  GENERAL:  He is a pleasant, well-appearing middle-  aged man, in no acute distress.  VITAL SIGNS:  Blood pressure was 159/100, the pulse was 70 and regular,  respirations were 18.  Weight was 264 pounds.  NECK:  Revealed no jugular venous distention.  LUNGS:  Clear bilaterally to auscultation.  No wheezes, rales, or  rhonchi are present.  CARDIOVASCULAR:  Revealed regular rate and rhythm.  Normal S1 and S2.  EXTREMITIES:  Demonstrated no edema.   Interrogation of his defibrillator demonstrates a [place] V-193,  the R-wave is greater than 12, the impedance 445 with a threshold of 1.2  volts at 0.5 milliseconds.  There are no intercurrent ICD therapies.   IMPRESSION:  1. Nonischemic cardiomyopathy.  2. Congestive heart failure, ejection fraction 30%.  3. Status post implantable cardioverter-defibrillator insertion.   DISCUSSION:  Overall Mr. Lamon is stable.  His defibrillator is working  normally.  We will see him back in the office for ICD followup in  1-year  to follow up in our [facility] in 3 months.     Champ Mungo. Lovena Le, MD  Electronically Signed    GWT/MedQ  DD: 12/23/2007  DT: 12/24/2007  Job #: [id number]

## 2010-09-16 NOTE — Assessment & Plan Note (Signed)
[facility]                            CARDIOLOGY OFFICE NOTE   NAME:[name], [name]                       MRN:          [id number]  DATE:12/14/2006                            DOB:          Jan 13, 1956    PRIMARY CARE PHYSICIAN:  Dr. Jennings [name].   REASON FOR PRESENTATION:  Patient with cardiomyopathy.   HISTORY OF PRESENT ILLNESS:  The patient is a pleasant 55 year old  gentleman who recently had to come off his Bidil, as the company is not  making this any more.  He was switched to Hydralazine nitrates.  He has  tolerated this well.  He is not having any new shortness of breath.  He  denies any PND or orthopnea.  He has had no palpitations, pre-syncope or  syncope.  He will get short of breath climbing a flight of stairs (class  2 symptoms).  He has not been watching what he eats very much; however,  he has been walking daily.   PAST MEDICAL HISTORY:  1. Congestive heart failure, non-ischemic.  His ejection fraction had      been 25%.  (It improved to 40%-45% in late 2007.)  2. Hypertension.  3. Insulin-dependent diabetes mellitus.  4. Dyslipidemia.  5. Mild obstructive sleep apnea, followed by Dr. Kathee [name].  6. History of a CVA.  7. Previous tobacco use.  8. Gout.  9. Obesity.   ALLERGIES:  No known drug allergies.   MEDICATIONS:  1. Potassium 40 mEq twice daily.  2. Hydralazine 37.5 mg, three tab three times daily.  3. Isosorbide 40 mg three times daily.  4. Metformin 500 mg twice daily.  5. Lantus.  6. Norvasc 5 mg daily.  7. Altace 10 mg twice daily.  8. Vytorin 10/40 mg daily.  9. Atacand 32 mg daily.  10.Multivitamin.  11.Coreg 50 mg twice daily.  12.Plavix 75 mg daily.  13.Lasix 80 mg twice daily.   REVIEW OF SYSTEMS:  As stated in the HPI and otherwise negative for  other systems.   PHYSICAL EXAMINATION:  GENERAL:  The patient is in no distress.  VITAL SIGNS:  Blood pressure 106/78, heart rate 79 and regular,  weight  264 pounds.  HEENT:  Eyes unremarkable.  Pupils equal, round, reactive to light.  Fundi not visualized.  Oral mucosa unremarkable.  NECK:  No jugular venous distention at 45 degrees.  Carotid upstroke  brisk and symmetric.  No bruits, no thyromegaly.  LYMPHATICS:  No cervical, axillary or inguinal adenopathy.  LUNGS:  Clear to auscultation bilaterally.  BACK:  No costovertebral angle tenderness.  CHEST:  Unremarkable.  HEART:  PMI not displaced or sustained.  S1 and S2 within normal limits.  No S3, no S4, clicks, rubs or murmurs.  ABDOMEN:  Obese, positive bowel sounds.  Normal in frequency and pitch.  No bruits, no rebound, no guarding.  No midline pulsatile mass.  No  hepatomegaly, no splenomegaly.  SKIN:  No rashes, no nodules.  EXTREMITIES:  With 2+ pulses.  No edema.   Electrocardiogram:  Sinus rhythm.  Mild inter-ventricular conduction  delay.  Left atrium enlargement.  No acute ST-T wave changes.  Mild QT  prolongation.   ASSESSMENT/PLAN:  1. Cardiomyopathy:  The patient is doing well with respect to this.      He is tolerating the medications as listed.  He will remain on      this.  2. Dyslipidemia:  His lipid profile was reasonable.  He will remain on      the medications as listed.  3. Weight:  We talked about the need to lose weight with diet and      exercise.  I have given him a sign that says don't feed the [name].      He gets fed a lot when he does [employer] jobs.   FOLLOWUP:  I will see the patient back in six months, or sooner if  needed.     Darius Breeding, MD, [facility]  Electronically Signed    JH/MedQ  DD: 12/14/2006  DT: 12/15/2006  Job #: [id number]   cc:   [name]. Darius Orleans, DO

## 2010-09-16 NOTE — Assessment & Plan Note (Signed)
[facility]                          CHRONIC HEART FAILURE NOTE   NAME:[name], [name]                       MRN:          [id number]  DATE:11/11/2006                            DOB:          [birth year]    The patient is seen in the heart failure clinic for followup associated  with his hydralazine isosorbide dinitrate drug titration.   The patient has been doing well since his last visit.  He has been  compliant with the heavier tablet regimen of the generic substitution  for BiDil. The patient has been feeling and doing well overall.  He has  had no chest pain or shortness of breath.  He does have some tiredness  associated with his drug therapy.  He has had no weakness or fatigue  outside of his norm.  He is able to walk at least 2 blocks before  getting short of breath.   PAST MEDICAL HISTORY:  1. Nonischemic cardiomyopathy.      a.     Cardiac cath, September 2005, revealed severe global       hypokinesis, left ventricular dysfunction, in part due to mitral       regurgitation.  No pulmonary hypertension or critical obstruction       was noted.      b.     Echocardiogram, July 01, 2004, revealed moderate global       reduction left ventricular function, ejection fraction of 40%.       Mild LVH and trace tricuspid regurgitation.      c.     Transthoracic echocardiogram on April 19, 2006, revealed       mildly dilated left ventricle, mild to moderate reduction in the       left ventricular systolic function.  Estimated at 40-45%.       Moderate hypokinesis of inferoseptal wall.  2. Internal defibrillator placed for number one.      a.     St. Jude Atlas single chamber defibrillator.  3. History of hypertension.  4. History of insulin-requiring diabetes.  5. Hyperlipidemia.  6. Mild obstructive sleep apnea.  7. History of basilar CVA in 2000.   REVIEW OF SYSTEMS:  As stated in the HPI, and otherwise negative.   DRUG ALLERGIES:   None.   CURRENT MEDICATIONS:  1. Lasix 80 mg twice daily.  2. Plavix 75 mg daily.  3. Protonix 40 mg daily.  4. Coreg 50 mg twice daily.  5. Multivitamin daily.  6. Atacand 32 mg daily.  7. Vytorin 10/40, one tablet daily.  8. Altace 10 mg daily.  9. Norvasc 5 mg daily.  10.Lantus as directed at bedtime.  11.Potassium 40 mEq twice daily.  12.Hydralazine 37.5 mg three times daily.  13.Isosorbide dinitrate 20 mg three times daily.  14.Byetta 5 mg twice daily.   PHYSICAL EXAMINATION:  VITAL SIGNS:  Weight today in the office is 262  pounds, blood pressure is 138/89, heart rate is 72, respirations are 17.  LUNGS:  Clear to auscultation in all lung fields bilaterally.  CARDIOVASCULAR:  Reveals S1 S2 within normal  limits.  No S3 is  appreciated.  ABDOMEN:  Protuberant without rebound or guarding.  EXTREMITIES:  Reveal mild lower extremity edema to the mid calf, no  neuro deficit is noted.   ASSESSMENT:  The patient was discontinue off of BiDil in the past due to  inability to obtain medication due to national stock out.  The patient  has tolerated the equivalent of one BiDil tablet 3 times day, will now  force titrate today for further improvement in blood pressure.   PLAN:  The patient will increase hydralazine to 75 mg three times daily  and isosorbide dinitrate to 40 mg three times daily.  He will call with  questions or problems.  In the meantime, he will follow up for a blood  pressure check in a month.      Alinda Deem, PharmD, BCPS, CPP  Electronically Signed      Minus Breeding, MD, [facility]  Electronically Signed   MP/MedQ  DD: 11/15/2006  DT: 11/15/2006  Job #: [id number]   cc:   Minus Breeding, MD, [facility]

## 2010-09-16 NOTE — Cardiovascular Report (Signed)
NAMEKEVRON, NACCARATO                ACCOUNT NO.:  [id number]   MEDICAL RECORD NO.:  [id number]          PATIENT TYPE:  OIB   LOCATION:  1961                         FACILITY:  [facility]   PHYSICIAN:  Loretha Brasil. Lia Foyer, MD, FACCDATE OF BIRTH:  Nov 16, 1955   DATE OF PROCEDURE:  01/10/2008  DATE OF DISCHARGE:  01/10/2008                            CARDIAC CATHETERIZATION   INDICATIONS:  Mr. Shor is a 55 year old gentleman who has presented with  a nonischemic cardiomyopathy.  Recent echocardiogram suggested worse  overall LV function and therefore cardiac catheterization was  recommended.  The patient has a prior implantable defibrillator.   PROCEDURE:  1. Right and left heart catheterization.  2. Selective coronary arteriography.  3. Selective left ventriculography.   DESCRIPTION OF PROCEDURE:  The patient was brought to the Cath Lab and  prepped and draped in the usual fashion.  Through an anterior puncture,  the right femoral vein was entered.  A 7-French sheath was placed.  Following this saturations were obtained in the superior vena cava using  a 7.5 French thermodilution Swan-Ganz catheter.  Serial right heart  pressures were recorded.  We also got a pulmonary artery saturation.  These were rechecked for accuracy.  Thermodilution cardiac outputs were  then performed.  Following this, the right femoral artery was entered  and a 4-French sheath was placed.  Pigtail catheter was placed in the  central aorta and left ventricle.  Simultaneous wedge LV pressures were  recorded.  Ventriculography was performed in the RAO projection.  Following this, views of the coronary arteries were obtained.  We used a  left 5 catheter because of the size.  He tolerated this well without  complications was taken to the holding area in satisfactory clinical  condition.   HEMODYNAMIC DATA:  1. Right atrial pressure 12.  2. RV 35/12.  3. Pulmonary artery 35/26.  4. Pulmonary capillary wedge 25.  5. LV  133/31.  6. Aortic 133/95, mean 112.  7. Fick cardiac output 6.5 L/min.  8. Fick cardiac index 2.7 L/min/m2.  9. Thermodilution cardiac output 5 L/min.  10.Thermodilution cardiac index 2.1 L/min/m2.  11.Superior vena cava saturation 71%.  12.Pulmonary artery saturation 71%.  13.Aortic saturation 96%.   ANGIOGRAPHIC DATA:  1. Ventriculography in the RAO projection reveals a globally      hypokinetic LV.  The LV appears to be enlarged and overall ejection      fraction to be estimated at 25-30%.  There may be some mild mitral      regurgitation, but difficult to assess because of the LV size.  2. The left main is calcified, but a very large-caliber vessel free of      critical disease.  3. The LAD courses all the way to the apex, and supplies the distal      inferior wall.  There may be some mild irregularity of about 40% in      the proximal diagonal.  There is minimal luminal irregularity      throughout the LAD but no critical stenosis.  4. The circumflex is a large dominant vessel.  There is a marginal and      posterolateral branch.  The continuation branch leading into the      PDA which is smaller in caliber has about 60-70% narrowing.  5. The right coronary artery is a nondominant vessel with about 60%      mid-narrowing.   CONCLUSIONS:  1. Severe nonischemic cardiomyopathy with severe LV dysfunction out of      proportion to the degree of coronary artery disease.  2. Mild scattered CAD.   DISPOSITION:  I had spoken with Dr. Percival Spanish.  He will see Dr. Percival Spanish  back in followup in the clinic and continued medical management will be  warranted.      Loretha Brasil. Lia Foyer, MD, [facility]  Electronically Signed     TDS/MEDQ  D:  01/10/2008  T:  01/10/2008  Job:  [id number]   cc:   Minus Breeding, MD, [facility]  CV Laboratory

## 2010-09-16 NOTE — Assessment & Plan Note (Signed)
[facility]                            CARDIOLOGY OFFICE NOTE   NAME:Darius, [name]                       MRN:          [id number]  DATE:01/19/2008                            DOB:          [birth year]    PRIMARY CARE PHYSICIAN:  Sandy Salaam. Shawna Orleans, DO.   REASON FOR PRESENTATION:  Evaluate the patient with cardiomyopathy.   HISTORY OF PRESENT ILLNESS:  The patient presents for followup after  recent catheterization.  When I last saw him, I sent him for an  echocardiogram, as it had been awhile.  I was disappointed to see that  his ejection fraction was 25%.  It had previously been in the 45% range.  I sent him for cardiac catheterization, which was done by Dr. Lia Foyer.  He had right and left heart catheterization.  His capillary wedge  pressure was 25.  His pulmonary artery is 35/26.  His EF was 25-30%, the  left main was calcified, but free of critical disease.  There were  luminal irregularities in the LAD.  The circumflex was dominant with 60-  70% PDA stenosis.  The right coronary artery is nondominant with 60%  stenosis.  Therefore, there was no etiology for his nonischemic  cardiomyopathy.  He was drinking a little bit of alcohol and he has  since stopped this.  He says his blood pressure is well controlled.  He  did have sleep apnea diagnosed some years ago, but said it was mild.  He does not wear CPAP.  He really gets along well.  He walks a lot for  exercise.  With this, he denies any new shortness of breath.  He has not  had any PND or orthopnea.  He has had no palpitation, presyncope, or  syncope.  He has had no chest pain.   PAST MEDICAL HISTORY:  Congestive heart failure (EF now about 25%),  hypertension, nonobstructive coronary artery disease, insulin-dependent  diabetes mellitus, dyslipidemia, mild obstructive sleep apnea,  cerebrovascular vascular accident, previous tobacco use, gout, and  obesity.   ALLERGIES:  None.   MEDICATIONS:  1. Metformin 500 mg b.i.d.  2. Isosorbide 40 mg t.i.d.  3. Hydralazine 75 mg t.i.d.  4. Lasix 80 mg b.i.d.  5. Plavix 75 mg daily.  6. Protonix 40 mg daily.  7. Carvedilol 50 mg b.i.d.  8. Multivitamin.  9. Altace 10 mg b.i.d.  10.Norvasc 5 mg daily.  11.Lantus.  12.Klor-Con.  13.Vytorin 10/40.   REVIEW OF SYSTEMS:  As stated in the HPI and otherwise negative for  other systems.   PHYSICAL EXAMINATION:  GENERAL:  The patient is in no distress.  VITAL SIGNS:  Blood pressure 121/76, heart rate 76 and regular, and  weight 262 pounds.  HEENT:  Eye, unremarkable.  Pupils, equal, round, and reactive to light.  Fundi not visualized.  NECK:  No jugular venous distention at 45 degrees.  Carotid upstroke  brisk and symmetric.  No bruits.  No thyromegaly.  LUNGS:  Clear to auscultation bilaterally.  CHEST:  Unremarkable.  HEART:  PMI not displaced or sustained.  S1 and S2  within normal limits.  No S3, no S4, no clicks, no rubs, no murmurs.  ABDOMEN:  Obese, positive bowel sounds.  Normal in frequency and pitch.  No bruits, no rebound, no guarding, no midline pulsatile mass, no  hepatomegaly, no splenomegaly.  SKIN:  No rashes, no nodules.  EXTREMITIES:  Pulses 2+, no edema.   ASSESSMENT AND PLAN:  1. Cardiomyopathy.  The patient has a nonischemic cardiomyopathy.  I      am not sure why his ejection fraction is falling.  He is on      appropriate maximal medications.  He is not going to be drinking      any alcohol at all, but I do not know if this was cause, as this      seems to be minimal.  I am going to repeat a sleep study to see if      his sleep apnea has worsened than it used to be.  He is not wearing      continuous positive airway pressure.  2. Hypertension.  Blood pressure is controlled and he will continue      medications as listed.  3. Status post implantable cardioverter-defibrillator.  We will have      this followed and is up-to-date in our [facility].  4.  Followup.  I will see him back in about 4 months or sooner if      needed.     Minus Breeding, MD, [facility]  Electronically Signed    JH/MedQ  DD: 01/19/2008  DT: 01/19/2008  Job #: [id number]   cc:   Sandy Salaam. Shawna Orleans, DO

## 2010-09-16 NOTE — Assessment & Plan Note (Signed)
[facility]                            CARDIOLOGY OFFICE NOTE   NAME:WEBBJeanpierre, Ivery                       MRN:          [id number]  DATE:07/11/2008                            DOB:          1956/04/03    PRIMARY CARE PHYSICIAN:  Biagio Borg, MD   REASON FOR PRESENTATION:  Evaluate the patient with cardiomyopathy and  atrial fibrillation.   HISTORY OF PRESENT ILLNESS:  The patient is 55 years old.  At the last  appointment, I noted him to be in atrial fibrillation, which was new.  He had his Plavix stopped and was started on Coumadin.  He is really not  feeling these palpitations, though he says occasionally he has this  sensation like a warmness or tingling that he thinks may be the  arrhythmia.  He is certainly having no presyncope or syncope.  He is  tolerating the Coumadin.  We did have his device interrogated today, and  though I do not have any atrial readings because it is a single-chamber  device.  He is noted to have a regular rhythm today.   The patient has otherwise been doing well.  He has not been having any  new shortness of breath.  He denies any PND or orthopnea.  He has not  been having any chest discomfort, neck or arm discomfort.  He is  tolerating the medicines as listed.  He has a complicated regimen and he  is very well versed with this.  He has not been exercising like I would  like because of the winter weather.  With this activities of daily  living, he denies any symptoms.  He says he has much more energy since  he has been using CPAP.   PAST MEDICAL HISTORY:  1. Cardiomyopathy (EF 25% nonischemic).  2. Nonobstructive coronary disease (catheterization 2009, left main      calcified, LAD luminal irregularities, circumflex 60%-70% PDA      stenosis, right coronary artery nondominant was 60% stenosis).  3. Hypertension.  4. Sleep apnea.  5. Insulin-dependent diabetes mellitus.  6. Dyslipidemia.  7. Cerebrovascular  accident.  8. Previous tobacco use.  9. Gout.  10.Obesity.  11.Status post St. Jude implantable defibrillator placement.  12.Atrial fibrillation.   ALLERGIES:  None.   MEDICATIONS:  1. Coumadin.  2. Simvastatin 80 mg daily.  3. Aspirin 81 mg daily.  4. Isosorbide 20 mg t.i.d.  5. Klor-Con 60 mEq b.i.d.  6. Lantus.  7. Norvasc 5 mg daily.  8. Altace 10 mg b.i.d.  9. Multivitamin.  10.Coreg 50 mg b.i.d.  11.Protonix.  12.Lasix 80 mg b.i.d.  13.Hydralazine 75 mg t.i.d.  14.Isosorbide 500 mg b.i.d.   REVIEW OF SYSTEMS:  As stated in the HPI and otherwise negative for  other systems.   PHYSICAL EXAMINATION:  GENERAL:  The patient is in no distress.  VITAL SIGNS:  Blood pressure 125/96 and heart rate 75 and regular.  HEENT:  Eyes unremarkable.  Pupils are equal, round, and reactive to  light.  Fundi not visualized.  Oral mucosa unremarkable.  NECK:  No  jugular venous distension at 45 degrees.  Carotid upstroke  brisk and symmetric.  No bruits.  No thyromegaly.  LYMPHATICS:  No cervical, axillary, or inguinal adenopathy.  LUNGS:  Clear to auscultation bilaterally.  BACK:  No costovertebral angle tenderness.  CHEST:  Revealed ICD pocket.  HEART:  PMI not displaced or sustained.  S1 and S2 within normal limits.  No S3.  No S4.  No clicks, rubs, or murmurs.  ABDOMEN:  Obese, positive bowel sounds, normal in frequency and pitch.  No bruits, rebound, or guarding.  No midline pulsatile mass.  No  hepatomegaly.  No splenomegaly.  SKIN:  No rashes.  No nodules.  EXTREMITIES:  A 2+ pulses throughout.  No edema.  No cyanosis or  clubbing.  NEUROLOGIC:  Oriented to person, place and time.  Cranial nerves II  through XII grossly intact.  Motor grossly intact.   ASSESSMENT AND PLAN:  1. Atrial fibrillation.  The patient apparently has paroxysmal atrial      fibrillation.  At this point, he will continue with Coumadin.  I do      not think I need to change his meds otherwise.  He is  not      particularly symptomatic with this, so I do not think      antiarrhythmics is indicated.  2. Cardiomyopathy.  His heart failure is well controlled.  He will      continue the meds as listed.  3. Dyslipidemia.  I did review his lipid profile.  He was not at      target.  However, he had his med changed about 6 weeks ago.  He is      due to get another lipid profile next week.  I will be happy to      look at this and make further suggestions.  4. Diabetes.  I reviewed his hemoglobin A1c and it was 7.8 last year.      Again, he is getting this checked and he understands the importance      of better sugar control.  5. Obesity.  He understands the need to lose weight with diet and      exercise.  6. Sleep apnea.  I am delighted that he feels better with continuous      positive airway pressure and he will continue this.  7. Status post implantable cardioverter-defibrillator.  We did      interrogate this today.  The capture threshold is 1.25 volts,      amplitude 12 millivolts, pacing lead impedance 500 ohms.  His V-      pace is less than 1% at the time.  There have been no episodes of      ventricular tachycardia or no shocks or anti-tachycardia pacing.  8. Followup.  The patient will come back in about 3 months to follow      up and particularly if there is arrhythmia.  Perhaps, we will get      him back to a q.6 month followup regimen if he is doing well.  No      further cardiovascular testing is suggested at this point or change      in his meds.     Minus Breeding, MD, [facility]  Electronically Signed    JH/MedQ  DD: 07/11/2008  DT: 07/12/2008  Job #: [id number]   cc:   Biagio Borg, MD

## 2010-09-19 NOTE — H&P (Signed)
[facility]  Patient:    [name], [name]                         MRN: [id number] Adm. Date:  [id number] Attending:  Pauline Good                         History and Physical  CHIEF COMPLAINT:  Blurred vision for three days.  HISTORY OF PRESENT ILLNESS:  This 55 year old black man who is followed regularly at the [facility] presents to [facility] Emergency Room today with a three-day history of blurred vision.  He also reports having noticed increased thirst and polyuria over the past week but no other symptoms.  He states he has not had a headache, no double vision, no fever, no sore throat, no cough, no nausea or vomiting or diarrhea.  He denies a history of diabetes.  He presents for evaluation and treatment of his visual changes.  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  Patient has no surgical history.  ALLERGIES:  None.  MEDICATIONS:  Coumadin 12 mg daily, Lotensin 40 mg twice a day, Lasix 80 mg b.i.d., clonidine 0.3 mg b.i.d., Norvasc 5 mg daily, atenolol 300 mg daily, K-Dur 20 mEq b.i.d.  SOCIAL HISTORY:  The patient is unemployed currently after the stroke.  He is married, has two children, does not smoke, and only rarely uses alcohol.  FAMILY HISTORY:  The patient has two living sisters.  His mother died of congestive heart failure.  His father died of unknown causes.  REVIEW OF SYSTEMS:  Unremarkable as noted above in the current history.  PHYSICAL EXAMINATION:  VITAL SIGNS:  Temperature 98.7, blood pressure 173/107, pulse 67 and regular, respirations 24.  GENERAL:  The patient is alert, oriented, cooperative x 3, in no acute distress.  HEENT:  Pupils equal and reactive to light, extraocular movements are full. TMs are clear.  Pharynx  shows extremely poor dentition.  NECK:  Supple.  Carotids are 2+ bilaterally without bruits.  No jugular venous distention.  No cervical adenopathy.  Thyroid is normal to palpation.  CHEST:  The lungs are clear throughout to auscultation and percussion.  CARDIAC:  Regular rate and rhythm without murmurs or gallops.  ABDOMEN:  Soft.  Bowel sounds are active.  No organomegaly.  No tenderness. No rebound or guarding.  GENITAL:  Normal.  RECTAL:  Not performed, is not pertinent to present illness.  EXTREMITIES:  Pulses 2+ and symmetric throughout.  There is no peripheral edema.  NEUROLOGIC:  Normal gross motor and sensory function.  Gait was not examined.  SKIN:  No rashes.  LABORATORY DATA:  Laboratory data at the time of admission showed a CBC with white blood cell count of 6600, normal differential, hemoglobin 15.5, hematocrit 46.1, platelet count was normal.  Protime was 24 seconds with an INR of 3.0.  BMET showed glucose of 657, sodium 132, potassium 4.1, chloride 94, CO2 28, BUN 26, creatinine 1.5.  Acetone level was negative.  CPK-MB was negative.  Chest x-ray was unchanged from admission in 2000.  CT brain scan showed no acute changes.  EKG showed a sinus bradycardia, normal sinus rhythm.  IMPRESSION: 1. A 55 year old gentleman with new onset of diabetes mellitus. 2. Hypertension. 3. History of cerebrovascular accident.  4. History of congestive heart failure, followed by the [facility] cardiology    service.  PLAN:  Will begin the _____ protocol.  Will transition to oral agents for ongoing diabetes management, hopefully in the morning, and obtain dietary consult for instruction.  Will continue other medications and hopefully have discharge within 24 hours. DD:  08/25/00 TD:  08/26/00 Job: [id number]

## 2010-09-19 NOTE — H&P (Signed)
[name], [name]                ACCOUNT NO.:  [id number]   MEDICAL RECORD NO.:  [id number]          PATIENT TYPE:  INP   LOCATION:  NA                           FACILITY:  [place]   PHYSICIAN:  Sueanne Margarita, PA   DATE OF BIRTH:  1956/04/29   DATE OF ADMISSION:  DATE OF DISCHARGE:                                HISTORY & PHYSICAL   ELECTROPHYSIOLOGIST:  Champ Mungo. Lovena Le, MD   PRIMARY CARE PHYSICIAN:  Sandy Salaam. Shawna Orleans, DO   ASSISTANT CARDIOLOGIST:  Shaune Pascal. Bensimhon, MD   ALLERGIES:  No known drug allergies.   PRESENTING CIRCUMSTANCE:  I am finally here for my defibrillator.   HISTORY OF PRESENT ILLNESS:  [name] is a 55 year old male.  He was seen by  Champ Mungo. Lovena Le, MD in May of this year at the request of Shaune Pascal.  Bensimhon, MD.  This patient has a history of hypertensive nonischemic  cardiomyopathy.  Overall, he has good exercise tolerance and noted is class  II congestive heart failure.  As far as his activity level, he is able to  achieve his work and recreation goals.  The patient complains of no  orthopnea, no dyspnea but only with moderate exertion.   He has cardiac catheterization September 2005.  At that time ejection  fraction was demonstrated to be 23%.  He had no angiographically exhibited  coronary artery disease.  The patient notes that he has improvement in  exercise tolerance from that date when medical therapy was initiated.  He is  currently on maximum medical therapy.  His echocardiogram February 2007  showed ejection fraction 20 to 25%.  The patient has no particular rebound  in ejection fraction even though he is on medical therapy and his congestive  heart failure symptoms are well controlled.  He wishes to pursue  cardioverter defibrillator.   PAST MEDICAL HISTORY:  1. Nonischemic cardiomyopathy, ejection fraction 20 to 25% by      echocardiogram, February 2007.  2. History of ethanol abuse.  3. Hypertension.  4. Diabetes insulin dependent,  diagnosed about four years ago.  5. History of cerebrovascular accident in 2000, no residua.  6. Obesity.  7. Mild obstructive sleep apnea.   SOCIAL HISTORY:  The patient lives in [place] with his wife.  He does not  smoke.  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   FAMILY HISTORY:  Mother died of coronary artery disease complications at age  59.  Father's medical history is unknown.  He has two sisters, neither of  whom have coronary artery disease.  There is obesity.   MEDICATIONS:  1. Lasix 80 mg twice daily.  2. Plavix 75 mg daily.  3. Bidil 20/37.5 two tablets three times daily.  4. Protonix 40 mg daily.  5. Coreg 25 mg 2 tablets twice daily.  6. Atacand 32 mg daily.  7. Vytorin 10/40 taken at bedtime.  8. Potassium chloride 20 mEq 2 tablets twice daily.  9. Norvasc 5 mg daily.  10.Lantus 15 units at bedtime.  11.Altace 10 twice daily.   REVIEW OF SYSTEMS:  The patient is not complaining of fevers, chills, night  sweats, or significant weight loss or gain. He has no epistaxis, vertigo,  nasal discharge, no photophobia. He has no lower extremities ulcerations,  rashes or lesions.  The patient is not complaining of chest pain, he has no  dyspnea on exertion, no orthopnea, no paroxysmal nocturnal dyspnea.  He  complains of no peripheral edema.  He has no symptoms of claudication.  He  is not experiencing palpitations.  He has no history of syncope or  presyncope.  He has no history of gastrointestinal bleeding, mild  gastroesophageal reflux disease, no anxiety or depression, no arthralgias,  no neurologic deficits remaining.   PHYSICAL EXAMINATION:  GENERAL:  Alert and oriented  x3, in no acute  distress.  VITAL SIGNS:  Temperature 98.0, blood pressure 141/95, pulse 72, respiratory  rate 20, oxygen saturations 97% on room air.  HEENT:  Normocephalic and atraumatic.  No nasal discharge.  Pupils equal,  round reactive to light.  Extraocular movements are  intact. Sclera are  nonicteric.  There are no xanthomata.  NECK:  The neck is supple with bruits auscultated, no jugular venous  distention.  LUNGS:  Clear to auscultation percussion bilaterally.  HEART:  Regular rate and rhythm without murmur.  ABDOMEN:  Soft, nondistended, bowel sounds are present.  Nontender, no  guarding.  EXTREMITIES:  Show no evidence of cyanosis, clubbing or edema.  Dorsalis  pedis pulses are 4/4 bilaterally.  The radial pulses are 4/4 bilaterally.  NEUROLOGIC:  Exam reveals no focal deficits.   IMPRESSION:  1. Nonischemic cardiomyopathy.  Ejection fraction 20 to 25%.  Please see      past medical history for other assessments.   PLAN:  Implantation of cardioverter defibrillator, Champ Mungo. Lovena Le, MD.  His  electrocardiogram QRS is 114 msec obviating, left ventricular bi-V pacing  lead, this device is put in for SCD-HeFT protocol.           ______________________________  Sueanne Margarita, PA     GM/MEDQ  D:  12/18/2005  T:  12/18/2005  Job:  [id number]

## 2010-09-19 NOTE — Cardiovascular Report (Signed)
NAMEJANTZ, SHADDY                            ACCOUNT NO.:  [id number]   MEDICAL RECORD NO.:  [id number]                   PATIENT TYPE:  INP   LOCATION:  4707                                 FACILITY:  [place]   PHYSICIAN:  Loretha Brasil. Lia Foyer, M.D. [facility]         DATE OF BIRTH:  1956-02-06   DATE OF PROCEDURE:  01/14/2004  DATE OF DISCHARGE:                              CARDIAC CATHETERIZATION   INDICATIONS:  [name] is a delightful 55 year old gentleman who has a  history of cardiomyopathy.  He presented with recurrent congestive heart  failure.  A current study was done to assess coronary anatomy and to assess  the severity of heart failure.   PROCEDURE:  1.  Left and right heart catheterization.  2. Selective coronary      arteriography.  3. Selective left ventriculography.  4. Aortic root      aortography.   DESCRIPTION OF THE PROCEDURE:  The patient was brought to the  catheterization laboratory and prepped and draped in the usual fashion.  Using a Smart needle, the right femoral vein was entered.  Swan-Ganz  catheter was taken up into the pulmonary artery.  We were unable to wedge it  at this point in time.  Following this, we went ahead with left heart  catheterization, central aortic and left ventricular pressures were measured  with a pigtail.  Ventriculography was performed in the RAO projection.  Proximal route aortography was then performed.  This was followed by a  standard coronary arteriography using standard Judkins catheters.  He  tolerated all of this well.  We then went back and attempted to repass the  Swan-Ganz catheter and were able to pass it to the pulmonary artery  position.  Following this, thermodilution cardiac outputs were performed.  All catheters were subsequently removed and the patient taken to the holding  area in satisfactory clinical condition.  The patient did receive two doses  of intravenous labetalol 20 mg to try to bring blood pressure down.   There  were no complications.   HEMODYNAMIC DATA:  1.  Right atrial pressure 12.  2.  RV 72/15.  3.  Pulmonary artery 66/39.  4.  Pulmonary capillary wedge 33.  5.  Aortic 141/107, mean 122.  6.  LV 144/37.  7.  No gradient on pull back across the aortic valve.  8.  Thick cardiac output 5.2 L per minute.  9.  Thick cardiac index 2.23 L per minute/sq m.  10. Thermodilution cardiac output 7.4 L/minute.  11. Thermodilution cardiac index 3.2 L/minute per sq m.  12. Superior vena cava saturation 61%.  13. Pulmonary artery saturation 55%.  14. Aortic saturation 91%.   ANGIOGRAPHIC DATA:  1.  Ventriculography was performed in the RAO projection.  There is severe      global hypokinesis.  Ejection fraction calculated by two different      observers revealed 22 and 23%, respectively.  There appeared to be      mitral regurgitation.  It was hard to grade but would appear to be mild      to moderate and secondary to ventricular dilatation.  2.  Proximal root aortography was performed and revealed no evidence of      significant aortic regurgitation.  3.  The left main coronary artery was free of critical disease.  4.  The left anterior descending artery course to the apex wrapped the      apical tip, and provided the entire inferior wall.  The LAD has some      minor luminal irregularity after the diagonal take-off measured at no      more than 20% luminal reduction.  5.  The circumflex is a large caliber vessel providing a couple of marginal      branches and a significant amount of the lateral wall.  Other than minor      luminal irregularity, no critical lesions are noted.  6.  The right coronary artery is a nondominant vessel.  Other than minimal      luminal irregularity, no significant focal stenoses are noted.   IMPRESSION:  1.  Severe nonischemic cardiomyopathy likely secondary to hypertension with      LV dysfunction, pulmonary hypertension, and secondary mitral       regurgitation.  2.  No critical coronary obstruction.  3.  Pulmonary hypertension probably related to elevated left ventricular end-      diastolic pressure.   RECOMMENDATIONS:  1.  I have reviewed the case with Dr. Percival Spanish.  2.  Needs different beta blocker.  3.  Consider BiDil.  4.  Alter medicines per Dr. Percival Spanish.  5.  Question sleep study.  6.  Significant weight loss and lifestyle changes.   DISPOSITION:  I have reviewed these findings with his wife.  Dr. Percival Spanish  will follow him.  Adjustments will need to be made in his medications.                                               Loretha Brasil. Lia Foyer, M.D. [facility]    TDS/MEDQ  D:  01/14/2004  T:  01/14/2004  Job:  [id number]   cc:   [facility]   Melissa L. Lovena Le, MD  Fax: [phone]   Minus Breeding, M.D.

## 2010-09-19 NOTE — Assessment & Plan Note (Signed)
[place]                          CHRONIC HEART FAILURE NOTE   NAME:WEBBJoanthan, Bada                       MRN:          [id number]  DATE:06/01/2006                            DOB:          23-May-1955    PRIMARY CARDIOLOGIST:  Dr. Haroldine Laws.   PRIMARY CARE:  Dr. Shawna Orleans.   Mr. Defina returns today for followup regarding his congestive heart  failure which is secondary to nonischemic cardiomyopathy presumably due  to hypertension.  Mr. Bernal has a long history of poorly controlled  hypertension; however, he has been very compliant with his medication in  the last year that I have been following him.  He also has a known  ejection fraction of 23% by cardiac catheterization in 2005.  Most  recent echo done in 2007 showed an EF of 20% to 25% also.  I sent the  patient to Dr. Caryl Comes for evaluation.  The patient is status post  implantation of an internal fibrillator by Dr. Lovena Le,  with a [place].  Mr. Gennuso states he has been doing well.  No firing from his defibrillator.  Continues to complain of generalized  fatigue after taking his BiDil.  This is not a new finding for him and  he has tolerated it.  He denies any presyncope or syncopal episodes.  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   PAST MEDICAL HISTORY:  Includes:  1. Congestive heart failure, secondary nonischemic cardiomyopathy with      an EF of 23% status post implantation of an ICD by Dr. Lovena Le.  2. History of poorly controlled hypertension.  3. Insulin-dependent diabetes.  4. Hyperlipidemia.  5. Mild obstructive sleep apnea evaluated by Dr. Gwenette Greet.  6. History of basilar CVA.  7. History of tobacco use.  8. History of gout.  9. History of morbid obesity.   REVIEW OF SYSTEMS:  As stated above.   CURRENT MEDICATIONS:  Include:  1. Lasix 80 b.i.d.  2. Plavix 75.  3. BiDil 2 t.i.d.  4. Protonix  40.  5. Coreg 25 two tablets b.i.d.  6. Multivitamin.  7. Atacand 32.  8. Vytorin 10/40.  9. Altace 10 b.i.d.  10.Potassium 40 t.i.d.  11.Norvasc 5.  12.Lantus insulin.  13.Byetta 5 mg b.i.d.   PHYSICAL EXAM:  No acute distress.  No jugular venous distention at a 45-degree angle.  LUNGS:  Clear to auscultation bilaterally.  CARDIOVASCULAR:  S1, S2.  Regular rate and rhythm.  ABDOMEN:  Soft, nontender.  Positive bowel sounds.  Lower extremities without clubbing, cyanosis, or edema.   IMPRESSION:  Stable class II heart failure at this time.  We will  continue the patient's current medications.  I have given the patient  samples of his BiDil, Plavix, Vytorin, and Atacand.  I will see him back  in 6 weeks, sooner if he has any problems.   ADDENDUM:  Recent lab work done on Mr. Facenda in December 2007 showed a  potassium of 3.4, BUN of  12, and a creatinine of 1.0.  The patient's lab  work was reviewed by Richardson Dopp, P.A., who had the patient increase  his potassium to 60 mEq b.i.d. with a repeat BMET to be drawn on Monday,  January 7.  Apparently, the patient did not keep that appointment for  his blood work and has not had it drawn.  I am asking the patient to  return ASAP within the next day or so to have his BMET rechecked.  Also,  the patient underwent an echocardiogram in December that showed his  ejection fraction had improved to 40-45% from his previous 20-25%.  I  will review this with the patient at his next visit and confirm that the  patient is taking his potassium as ordered, as he is on Lasix 80 mg  b.i.d.      Rosanne Sack, ACNP  Electronically Signed      Minus Breeding, MD, [facility]  Electronically Signed   MB/MedQ  DD: 06/01/2006  DT: 06/01/2006  Job #: [id number]   cc:   Sandy Salaam. Shawna Orleans, DO

## 2010-09-19 NOTE — Discharge Summary (Signed)
[place]  Patient:    [name], [name]                         MRN: [id number] Adm. Date:  [id number] Disc. Date: [id number] Attending:  Pauline Good CC:         [facility]   Discharge Summary  ADMISSION DIAGNOSES: 1. New onset diabetes mellitus. 2. Hypertension. 3. History of cerebrovascular accident, October 2000. 4. History of congestive heart failure.  DISCHARGE DIAGNOSES: 1. New onset diabetes mellitus. 2. Hypertension. 3. History of cerebrovascular accident, October 2000. 4. History of congestive heart failure.  CONSULTATIONS:  None.  SPECIAL PROCEDURES:  None.  HOSPITAL COURSE:  This is a 55 year old black gentleman who was admitted on August 25, 2000 with a three day history of blurred vision and a one week history of thirst and polyuria and was found in the emergency room to have a blood sugar 657.  The remainder of his workup was negative.  He was started on a ______ protocol and within 12 hours his blood sugar was down to 103.  The patient had an uncomplicated hospital course and on the day after admission, received instructions in his diet, an 1800 calorie diet, CBG home glucose monitoring.   He was started on amaryl 4 mg a day and glucophage 500 mg twice a day.  By April 26, he was felt to be ready for discharge.  His laboratory data on April 26 showed a sodium of 136, potassium 3.1, chloride 101, CO2 31, glucose 328, BUN 10, creatinine 1.1, calcium 8.6.  It was felt his morning blood sugar was due to the fact that he had a continuously running IV infusion of D5 normal saline.  His incision has improved within 24 hours.  His blood pressure at the time of discharge was 156/97.  Overall, the patients condition is felt to be improved and he was discharged on amaryl 4 mg a day, glucophage 500 b.i.d. for another three days and then to increase to 1000 b.i.d.  He was to do twice a day CBG monitoring at  home.  DISCHARGE MEDICATIONS:  He was to continue on his other admission medications, which included: 1. Coumadin 12 mg a day. 2. Lotensin 40 mg b.i.d. 3. Furosemide 80 mg b.i.d. 4. Clonidine 0.3 mg b.i.d. 5. Norvasc 5 mg q.d. 6. Atenolol 300 mg q.d. 7. K-Dur 20 mEq b.i.d.  FOLLOWUP:  He is to follow up at [facility] next week for brief followup of his CBGs monitorings.  He was instructed to walk 30 minutes a day and he was also instructed and agrees to attend outpatient diabetes classes every Wednesday morning at the [facility].  The patient seems to be very agreeable and understanding of his condition and the management of his problem.  He agrees not to drink alcohol.  His family has been supportive and have stated that they will help him with his diet management.  I believe he will have a good prognosis with his diabetes management. DD:  08/27/00 TD:  08/29/00 Job: [id number]

## 2010-09-19 NOTE — Discharge Summary (Signed)
NAMEHENDRIK, [name]                ACCOUNT NO.:  [id number]   MEDICAL RECORD NO.:  [id number]          PATIENT TYPE:  INP   LOCATION:  3733                         FACILITY:  [facility]   PHYSICIAN:  Champ Mungo. Lovena Le, MD    DATE OF BIRTH:  Jul 28, 1955   DATE OF ADMISSION:  12/18/2005  DATE OF DISCHARGE:  12/18/2005                                 DISCHARGE SUMMARY   ALLERGIES:  No known drug allergies.   This dictation taking 30 minutes.   PRINCIPAL DIAGNOSES:  1. Discharging day #1 status post implant of St. Jude ATLAS + VR single      chamber cardioverter defibrillator; defibrillator threshold study less      than or equal to 15 joules.  2. Hypertensive nonischemic cardiomyopathy.      a.     Echocardiogram February 2007 ejection fraction 20-25%.      b.     Class II congestive heart failure.   SECONDARY DIAGNOSES:  1. History of ethanol abuse.  2. Diabetes diagnosed about 4 years ago, insulin dependent.  3. History of cerebrovascular accident in 2000, no residual.  4. Mild obesity.  5. Hypokalemia this admission on a regimen of Lasix 80 mg b.i.d. and      potassium chloride 40 mEq b.i.d.; potassium dosing will be adjusted at      discharge.   PROCEDURE:  December 18, 2005, implant of St. Jude ATLAS cardioverter  defibrillator, Dr. Cristopher Peru, with defibrillator threshold study.  The  patient has had no post procedural complications.  No hematoma at the ICD  pocket.  He is alert and oriented x3.  His potassium will be obtained on the  morning of August 18.  His admission potassium of 3.1, and his potassium has  been aggressively replenished.   BRIEF HISTORY:  Mr. Mcilhenny is a 55 year old male.  He was seen by Dr. Lovena Le  in May of this year at the request of Dr. Haroldine Laws.  The patient has a  history of hypertensive nonischemic cardiomyopathy.  He has good exercise  tolerance.  Noted is class II congestive heart failure.  As far as his  activity level, he is able to achieve his  work and recreational goals.  He  has no complain of orthopnea.  He has dyspnea only with moderate exertion.   He had catheterization study September 2005; ejection fraction was 23% at  that time.  There was no significant coronary atherosclerosis.  The patient  reached improvement in his exercise tolerance from that date when medical  therapy was initiated.  However, echocardiogram February 2007 showed an  ejection fraction 20-25%.  The risks and benefits have been discussed with  the patient.  He is continuing to be very compliant on maximal medical  therapy.  He qualifies for implantation of cardioverter defibrillator for  primary prevention of sudden cardiac death according to the SCD-HeFT  criteria.  He presents on August 17 for implantation by Dr. Lovena Le.   HOSPITAL COURSE:  The patient presented electively for ICD implantation  August 17.  He underwent the procedure with implantation of  a St. Jude ALTAS  + VR single chamber defibrillator with Dr. Lovena Le.  He had defibrillator  threshold study.  He has had no post procedural complications.  He was noted  to have a low potassium on admission; potassium was 3.1.  His medication  will be shifted so that he will avoid hypokalemia in the future.   He is discharged as follows:  He is not to drive for the next week.  He is  not to lift anything heavier than 10 pounds for the next 4 weeks.  He is to  keep his incision dry for next 7 days, to sponge bathe until Friday, August  24.  He has follow-up with [facility], [address].  ICD clinic Wednesday, September 5, at 9:20.  He will see Dr. Lovena Le in  December, and Dr. Tanna Furry office will call with that appointment.   DISCHARGE MEDICATIONS:  Medications are as follows:  1. Januvia 100 mg daily.  2. Lantus 12 units subcutaneous injection at night.  3. Altace 10 mg twice daily.  4. Norvasc 5 mg daily.  5. Coreg 25 mg 2 tablets in the morning, 2 tablets in the evening.   6. Vytorin 10/40 one tablet at bedtime.  7. Atacand 32 mg daily.  8. Protonix 40 mg daily.  9. Bidil 20/37.5 two tablets 3 times daily.  10.Plavix 75 mg daily.  11.Lasix 80 mg twice daily.  12.Potassium chloride; it was 40 mEq twice daily; this should be amended      at discharge.   LABORATORY DATA:  This admission, August 17, hemoglobin 13, hematocrit 39.1,  white cells 4.5, and platelets of 191.  Serum electrolytes on admission:  Sodium 141, potassium 3.1, chloride 106, carbonate 28, BUN is 15, creatinine  1.1.  PT was 14.4, INR 1.1.     ______________________________  Sueanne Margarita, PA    ______________________________  Champ Mungo. Lovena Le, MD    GM/MEDQ  D:  12/18/2005  T:  12/19/2005  Job:  [id number]   cc:   Shaune Pascal. Bensimhon, MD  Sandy Salaam. Shawna Orleans, DO

## 2010-09-19 NOTE — Assessment & Plan Note (Signed)
[facility]                          CHRONIC HEART FAILURE NOTE   NAME:[name], [name]                       MRN:          [id number]  DATE:07/29/2006                            DOB:          December 20, 1955    HISTORY OF PRESENT ILLNESS:  [name] returns today for follow up  regarding his congestive heart failure which is secondary to nonischemic  cardiomyopathy presumably due to hypertension.  [name] states he has  been doing quite well.  He has made some adjustments in his medication.  Darius BiDil causes him to feel extremely fatigued.  He changed Darius  medications so that he is taking two tablets twice a day only.  [name]  saw Dr. Haroldine [name] just recently for routine cardiology visit at which  time he cut his Lasix back to 40 mg b.i.d. and his potassium back to 20  mEq b.i.d.  [name] misunderstood and did not make Darius adjustments in his  Lasix or potassium dose.  [name] also had mentioned possibly  cutting back on [name]'s Norvasc.  When he saw [name] back in  February, he was hypotensive with a blood pressure at 108/80.  However,  [name]'s blood pressure has never run that low.  I am not sure for Darius  cause of hypotension on that one visit.  He normally runs at least  130/80's.  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  Denies any  orthopnea, PND or peripheral edema.  No problems with his defibrillator.   PAST MEDICAL HISTORY:  1. Congestive heart failure secondary to nonischemic cardiomyopathy,      most likely due to uncontrolled hypertension.  EF previously 23%      status post implantation of ICD by Dr. Lovena Le.  Most recent      echocardiogram showed improvement in ejection fraction of 40-45% in      December 2007.  2. History of poorly controlled hypertension.  3. Insulin-dependent diabetes.  4. Hyperlipidemia.  5. Mild obstructive sleep apnea followed by Dr. Gwenette Greet.  6. History of CVA.  7. History of tobacco use.  8. History  of gout.  9. History of morbid obesity.   REVIEW OF SYSTEMS:  As stated above.   MEDICATIONS:  1. Lasix 80 mg b.i.d.  2. Plavix 75 mg daily.  3. Protonix 40 mg daily.  4. Coreg 50 mg b.i.d.  5. Multivitamin.  6. Atacand 32 daily.  7. Vytorin 10/40.  8. Altace 10 mg b.i.d.  9. Norvasc 5 mg daily.  10.Lantus as directed.  11.Potassium 40 mEq b.i.d.  12.BiDil two tablets t.i.d.  [name] states he is currently taking      two tablets b.i.d.   PHYSICAL EXAMINATION:  VITAL SIGNS:  Weight 266 pounds, blood pressure  131/77, heart rate 84.  GENERAL:  [name] is in no acute distress.  No jugular venous distention at  45 degree angle.  LUNGS:  Clear to auscultation bilaterally.  CARDIOVASCULAR:  S1, S2.  Regular rate and rhythm.  ABDOMEN:  Soft, nontender, positive bowel sounds, obese.  LOWER EXTREMITIES:  Without  cyanosis, clubbing or edema.   IMPRESSION:  Congestive heart failure secondary to nonischemic  cardiomyopathy.  Ejection fraction currently 40-45%.  I am going to have  [name] continue his BiDil.  However, he is agreeable to decreasing  it to one tablet p.o. t.i.d.  Will continue other medications.  I am  going to speak with [name] and have him cut his Lasix back to Darius  40 b.i.d. which he should have already done according to Dr. Clayborne Dana  note.  I will call [name] at home to confirm what he is actually  taking and make adjustments in his potassium if needed.  Otherwise, no  change in medications.   ADDENDUM  I spoke with [name] by phone to confirm his furosemide dose. He is  still taking Darius furosemide 80 mg b.i.d. I instructed him to go ahead  and cut that back to 40 mg twice a day. This will be 1/2 tablet in Darius  morning and 1/2 tablet in Darius evening. We will continue his potassium  for now at 40 mEq b.i.d., as he was found to be hypokalemic in February  when he had a level checked at 3. We will need to repeat potassium level  at next visit or if  Dr. Shawna Orleans sees [name] before then.  If he would be so  inclined to check a BMET on [name], if he is going to obtain blood  work, that would be helpful.      Rosanne Sack, ACNP  Electronically Signed      Shaune Pascal. Bensimhon, MD  Electronically Signed   MB/MedQ  DD: 07/29/2006  DT: 07/29/2006  Job #: [id number]   cc:   Sandy Salaam. Shawna Orleans, DO  Jill Alexanders, M.D.

## 2010-09-19 NOTE — Assessment & Plan Note (Signed)
[facility]                                   ON-CALL NOTE   NAME:[name], [name]                       MRN:          [id number]  DATE:12/19/2005                            DOB:          Oct 19, 1955    HISTORY:  [name] is a 55 year old patient who was admitted on December 19, 2005 for IC implantation.  He was evaluated by Dr. Caryl Comes on December 19, 2005  and felt stable enough and discharged to home  Please see the discharge  summary that has already been dictated for complete details.  At discharge  his potassium was 3.7, BUN 16, creatinine 1.6.  His potassium was low on  admission and was repleted.  I have asked him to come in on Monday August 20  at 9 a.m. for repeat BMET to follow up on his renal function, potassium.   PLAN:  As noted above the patient will have a BMET on August 20 at 9 a.m. to  follow up his renal function and potassium.   DISPOSITION:  As noted.                                   Darius Dopp, PA-C                                Shaune Pascal. Bensimhon, MD   SW/MedQ  DD:  12/19/2005  DT:  12/19/2005  Job #:  [id number]

## 2010-09-19 NOTE — Assessment & Plan Note (Signed)
[facility]                          CHRONIC HEART FAILURE NOTE   [name], [name]                       MRN:          [id number]  DATE:04/08/2006                            DOB:          November 08, 1955    [name] is a patient of Dr. Haroldine Laws and Dr. Shawna Orleans.  He returns today  for further evaluation and medication titration of his congestive heart  failure which is secondary to nonischemic cardiomyopathy, presumably due  to hypertension.  [name] has a long history of poorly controlled  hypertension.  He also has a known ejection fraction of 23% by cardiac  catheterization in 2005.  Most recent echocardiogram done on February of  2007 shows an EF of 20% to 25%.  The patient is now status post  implantation of internal defibrillator by Dr. Lovena [name] with a St. Jude  Atlas single chamber device.  [name] states he has been doing well, no  ICD firings.  His only complaint is a headache, which is chronic from  his Bidil, usually worse in the morning.  I had a long discussion with  [name] about his Bidil and high blood pressure, and limited options as  far as blood pressure control.  He does, however, want to continue with  the Bidil, denies any episodes of presyncope or syncopal episodes, or  orthopnea or PND.  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   PAST MEDICAL HISTORY:  1. Congestive heart failure secondary to nonischemic cardiomyopathy,      with an EF of 23%, status post implantation of an internal      defibrillator by Dr. Lovena [name].  2. Poorly controlled hypertension in the past.  3. Insulin-dependent diabetes.  4. Hyperlipidemia.  5. Mild obstructive sleep apnea, evaluated by Dr. Gwenette [name].  6. History of basilar CVA.  7. History of tobacco use.  8. History of gout.  9. History of morbid obesity.   REVIEW OF SYSTEMS:  As stated  above, otherwise negative.   CURRENT MEDICATIONS:  1. Lasix 80 mg b.i.d.  2. Plavix 75.  3. Bidil 2 tablets t.i.d.  4. Protonix 40.  5. Coreg 25 two tablets b.i.d.  6. Multivitamin daily.  7. Atacand 32.  8. Vytorin 10/40.  9. Altace 10 mg b.i.d.  10.Potassium 40 mEq b.i.d.  11.Norvasc 5 mg daily.  12.Lantus insulin as directed.   PHYSICAL EXAMINATION:  Blood pressure today 135/84, which is very good  for [name], pulse 83 with a weight of 266.  [name] is in no acute distress.  No jugular vein distention at 45  degree angle.  LUNGS:  Clear to auscultation bilaterally.  CARDIOVASCULAR:  Exam reveals S1, S2, regular rate and rhythm.  ABDOMEN:  Soft, nontender, protuberant, positive bowel sounds.  LOWER EXTREMITIES:  Without clubbing, cyanosis or edema.  NEUROLOGIC:  The patient alert and oriented x3.  Cranial nerves II-XII  grossly intact.   IMPRESSION:  Stable class  II heart failure at this time.  We will  continue current medications and see patient back in early 2008 for  followup.      [name], ACNP  Electronically Signed      Darius Pascal. Bensimhon, MD  Electronically Signed   MB/MedQ  DD: 04/08/2006  DT: 04/09/2006  Job #: [id number]

## 2010-09-19 NOTE — Assessment & Plan Note (Signed)
[facility]                         ELECTROPHYSIOLOGY OFFICE NOTE   NAME:WEBBHaytham, Vairo                       MRN:          [id number]  DATE:04/13/2006                            DOB:          1956/03/30    HISTORY:  Mr. Balraj Ifft is seen.  He is status post ICD implantation  by Dr. Champ Mungo. Lovena Le for non-ischemic hypertensive cardiomyopathy.  He  and I had a long chat about basketball.  He is quite a Development worker, international aid.  He  is having no problems with chest pain or shortness of breath.  He did  have concerns about the healing of his site, which is a keloid.   PHYSICAL EXAMINATION:  VITAL SIGNS:  Blood pressure today was very  elevated at 160/110.  This is significantly discordant from all of the  blood pressures measured.  He had a sausage biscuit this morning and did  not take one of his medicines appropriately.  I have asked that he  follow up and get his blood pressures checked more carefully.  LUNGS:  Clear.  HEART:  Sounds were regular.   Interrogation of his [place] [phone] demonstrated an R-wave of 12,  impedance of 450, threshold of 0.5 and 0.5.  The better voltage of 3.2.  There are no inter-current therapies.   IMPRESSION:  1. Hypertensive non-ischemic cardiomyopathy.  2. Status post implantable cardioverter defibrillator for the above.   PLAN:  Mr. Galin is doing fine.  He will be seen again in four months'  time in the [facility].     Deboraha Sprang, MD, [facility]  Electronically Signed    SCK/MedQ  DD: 04/13/2006  DT: 04/13/2006  Job #: [id number]   cc:   Sandy Salaam. Shawna Orleans, DO

## 2010-09-19 NOTE — H&P (Signed)
[name], [name]                            ACCOUNT NO.:  [id number]   MEDICAL RECORD NO.:  [id number]                   PATIENT TYPE:  INP   LOCATION:  1826                                 FACILITY:  [facility]   PHYSICIAN:  Melissa L. Lovena Le, MD               DATE OF BIRTH:  09-10-1955   DATE OF ADMISSION:  01/10/2004  DATE OF DISCHARGE:                                HISTORY & PHYSICAL   CHIEF COMPLAINT:  Shortness of breath.   PRIMARY CARE PHYSICIAN:  [facility].   HISTORY OF PRESENT ILLNESS:  This is a 55 year old African-American male  with a past medical history for a diabetic cardiomyopathy.  It is unclear at  this time if it is ischemic versus hypertensive in nature, who began to have  some shortness of breath several days prior to admission.  He states earlier  this morning he became very restless, he could not breathe, and therefore he  came into the emergency room.  The patient denied associated chest pain with  the onset of these symptomatologies.  He does, however, state that he ran  out of his medications early last week and was scheduled to pick more up  when his symptoms evolved.  He does relate that occasionally he does have  intermittent chest discomfort, which he was unable to relate a value for.  He says it is usually sharp and is relieved with rest.  The patient states  that for the past couple of days he has had PND, orthopnea, dyspnea on  exertion, and also relates a more chronic condition which is unclear whether  it is related to this event.  He complains of left arm numbness, usually in  the trapezius of the left shoulder and radiating down into the arm.  He  states the last episode started about a week a go, and he thought it was a  pinched nerve.  The patient states that he has seen Dr. Percival Spanish in the past  but has not really been seen in the last year.   REVIEW OF SYSTEMS:  Complains of some stomach upset last night, shortness of  breath, breathless,  orthopnea, PND.  No chest pain.  He denies fever,  chills, nausea, vomiting, diarrhea.  He denies dysuria, hematuria, or melena  or hematochezia.   PAST MEDICAL HISTORY:  1.  He was diagnosed with diabetes in 2002.  2.  He has had an embolic basilar CVA in [id number].  3.  He has hypertension.  4.  He has a cardiomyopathy, which we will attempt to elucidate the nature      of this.  5.  Congestive heart failure in the past.  6.  DKA.  7.  Intermittent gout.   PAST SURGICAL HISTORY:  None.   He is married.  He has a 65 year old child and a 20 year old child.  He does  not smoke.  He does not  drink except for on occasion a beer.  He denies any  illicit drug use.   PAST FAMILY HISTORY:  Mom died of congestive heart failure.  Dad had unknown  illnesses.   ALLERGIES:  No known drug allergies.   MEDICATIONS:  1.  Atenolol 200 mg once daily.  2.  Norvasc 10 mg once daily.  3.  Clonidine 0.3 mg b.i.d.  4.  Glucophage 850 mg b.i.d.  5.  Actos 15 mg daily.  6.  Multivitamin.  7.  Zocor 20 mg daily.  8.  Prinivil 20 mg b.i.d.  9.  Plavix 75 mg.  10. Lasix 80 mg b.i.d.   PHYSICAL EXAMINATION:  VITAL SIGNS:  Temperature is 97.4, blood pressure on  admission was 186/130.  With the administration of IV Lasix and Nitrol  paste, the blood pressure is 151/96.  Pulse is 96, respiratory rate is 24,  now down to 20 and 22.  He is 98% on room air.  GENERAL:  He is very weak.  HEENT:  He is normocephalic, atraumatic.  Pupils equal, round, and reactive  to light.  Extraocular muscles are intact.  His mucous membranes are moist.  NECK:  Supple.  There JVD  6-7 cm.  There are no bruits, no thyromegaly.  CHEST:  Decreased breath sounds, right greater than left, with crackles  right greater than left.  There are no wheezes.  CARDIOVASCULAR:  Regular rate and rhythm, positive S1, S2.  There is either  an S4 or an S3.  At this time it is difficult to elucidate.  I will request  a cardiology opinion on  this.  ABDOMEN:  Obese, nontender, nondistended, with positive bowel sounds.  EXTREMITIES:  No clubbing, cyanosis, or edema.  NEUROLOGIC:  He is awake, alert and oriented x3.  Cranial nerves II-XII are  intact.  Power is 4/5 bilaterally symmetrical.  Deep tendon reflexes are 1+.   LABORATORY DATA:  Sodium of 143, potassium 2.7, chloride of 111, CO2 of 26,  BUN is 14, creatinine is 1.2, glucose is 118.  White count is 6.7,  hemoglobin is 13.4, hematocrit 39.3, platelets of 188.   ASSESSMENT AND PLAN:  This is a 55 year old African-American male with  cardiomyopathy.  Last ejection fraction is noted to be 40-50% in [id number], with  diastolic dysfunction, left ventricular hypertrophy, and global hypokinesis.  [facility] is attempting to obtain a more recent echo in 2003.  He presents  with decompensated heart failure after not taking his medications for  several days.  It is unclear whether there is an inciting event or if it was  because his medications were discontinued.   1.  Cardiovascular, decompensated heart failure.  We will continue to      diurese him, control his blood pressure, and continue his Plavix.  I      have consulted [facility], and I will defer to them to change      his current regimen and decide on whether he needs ACF level Lovenox for      now.  Will use Lovenox for deep vein thrombosis prophylaxis.  At this      point I plan to resume his home medications except for a decreased dose      of beta blocker in the form of Lopressor 50 mg b.i.d. unless otherwise      specified by [facility].  2.  Pulmonary.  This is stabilized after Lasix is given.  His x-ray is  consistent with congestive heart failure.  3.  Gastrointestinal.  He has recent gastrointestinal upset.  It is unclear      whether it is related to the symptomatology or if there is something      else going on, so we will provide Protonix 40 mg daily. 4.  Genitourinary.  Strict I&O's and daily  weights.  Also request a UA.  5.  Endocrine.  Will check a hemoglobin A1C and a TSH, but for now will hold      his Glucophage and Actos and use sliding scale insulin until he is      stabilized.                                                Melissa L. Lovena Le, MD    MLT/MEDQ  D:  01/10/2004  T:  01/10/2004  Job:  [id number]   cc:   Karoline Caldwell

## 2010-09-19 NOTE — Consult Note (Signed)
[name], [name]                            ACCOUNT NO.:  [id number]   MEDICAL RECORD NO.:  [id number]                   PATIENT TYPE:  INP   LOCATION:  4707                                 FACILITY:  [facility]   PHYSICIAN:  [name], M.D.                 DATE OF BIRTH:  1955/05/08   DATE OF CONSULTATION:  01/10/2004  DATE OF DISCHARGE:                                   CONSULTATION   REFERRING PHYSICIAN:  Dr. Lenna Sciara L. Lovena Le.   IDENTIFICATION:  [name] is a 55 year old gentleman with a history of  hypertension, presumed hypertrophic cardiomyopathy.  He was last seen in  cardiology clinic back in 2003.  He comes in today with shortness of breath.   HISTORY OF PRESENT ILLNESS:  The patient says he was doing okay until just  the past day or two.  He ran out of his medicines on January 08, 2004; he  just did not get them refilled.  He took his blood pressure at [facility]  prior to this and said it was in the [id number] systolic range.  Last evening, he  became short of breath, could not breathe, denied chest pain, presented to  the emergency department.  Noticed some numbness in the left arm and left  shoulder about 1 week ago, thought it was pinched.   ALLERGIES:  None.   MEDICATIONS:  1.  Ran out of atenolol 300 mg daily.  2.  Ran out of Norvasc 10 mg daily.  3.  Ran out of clonidine 0.3 mg b.i.d.  4.  Ran out of Lasix, took 1 yesterday (previously on 80 mg b.i.d.).  5.  Glucophage 850 mg b.i.d.  6.  Actos 15 mg daily.  7.  Multivitamin daily.  8.  Zocor 20 mg nightly.  9.  Prinivil 20 mg daily.  10. Plavix 75 mg daily.   PAST MEDICAL HISTORY:  1.  Hypertension times years.  2.  CHF felt secondary to hypertension.  Last echo in May 2003 showed severe      LVH with diastolic dysfunction, EF of 50% to 55%.  3.  History of CVA, [id number], ? embolic.  4.  History of diabetes, diagnosed 2002-2003.  5.  History of gout.  6.  Dyslipidemia, no [name] available.   SOCIAL HISTORY:   The patient is married, on disability.  He does not smoke.  He drinks 1 beer every 2 weeks.   FAMILY HISTORY:  ?CHF in mom.  Otherwise, father not known.   REVIEW OF SYSTEMS:  Except as noted previously, the patient has some  complaints of nausea.  Otherwise, all systems reviewed and negative except  for above problems except as noted.   PHYSICAL EXAM:  GENERAL:  On exam, patient currently in no acute distress.  VITAL SIGNS:  Blood pressure on arrival to the ER, 186/30, down to 151/96;  pulse is 96; temperature  is 97.4.  NECK:  JVP is normal.  LUNGS:  Clear.  No rales at present.  CARDIAC EXAM:  Regular rate and rhythm, S1 and S2.  No S3, S4 or murmurs.  ABDOMINAL EXAM:  Supple.  No hepatomegaly.  EXTREMITIES:  No edema.   LABORATORY AND ACCESSORY CLINICAL DATA:  Chest x-ray:  Cardiomegaly.  ?Some  increased vasculature (by report).   Twelve-lead EKG on arrival showed sinus tachycardia.  Voltage criteria with  LVH.  T wave inversion in the inferior and lateral leads consistent with  strain pattern, cannot rule out ischemia.   Labs significant for a BUN and creatinine of 14 and 1.2, potassium of 2.7;  hemoglobin of 13.4, WBC of 6.7.  Point-of-care markers negative.   IMPRESSION:  Patient is a 55 year old gentleman with a history of  hypertension, does not appear to be optimally controlled, question if he  ever had a noninvasive study in the past (renal ultrasound).  Since he is on  so many medicines, we will need to review.  He ran out of his medicines 2  days ago (blood pressure regimen), only on Prinivil and Lasix.  Blood  pressure on arrival high, most likely it represents again rebound from this  with some diastolic dysfunction.  Agree with echocardiogram.  Resume  medicines and watch blood pressure.  Breathing is better now; I am not clear  if he got Lasix in the emergency room.   I again talked to the patient and told him the medicines that are critical  to continue; he  understands this, will need to get tighter control of his  blood pressure.   At some point, he should have a noninvasive evaluation, given his diabetes  and history of cerebrovascular accident; this does not necessarily need to  be done as an inpatient.   Would check fasting lipid panel in the a.m.                                               [name], M.D.    PVR/MEDQ  D:  01/10/2004  T:  01/10/2004  Job:  [id number]

## 2010-09-19 NOTE — Assessment & Plan Note (Signed)
Darius                   COUMADIN / CHRONIC HEART FAILURE CLINIC NOTE   [name], [name]                       MRN:          [id number]  DATE:03/02/2006                            DOB:          02-26-1956    [name] returns today for further evaluation of medication titration of his  congestive heart failure which is secondary to nonischemic cardiomyopathy,  presumably due to hypertension.  [name] is status post recent  defibrillator placement of a St. Jude Atlas [phone] by Dr. Lovena Le on December 18, 2005.  [name] states he is still having some soreness around the incision  site and he is still very much aware of the pacer site.  Denies any episodes  of defibrillator firing.  No presyncope or syncopal episodes, palpitations,  lightheadedness, or dizziness.  He states he does feel weak sometimes by the  end of the day.  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  He is right-handed.  No injuries to the pacemaker site that he is  aware of.   PAST MEDICAL HISTORY:  Includes:  1. Nonischemic cardiomyopathy/congestive heart failure with an EF of 23%      by cardiac catheterization in 2005.  Most recent echocardiogram done in      February shows an EF of 20-25%, now the patient is status post      implantation of an internal defibrillator by Dr. Lovena Le ([place]).  2. History of poorly controlled hypertension.  3. Insulin-dependent diabetes.  4. Hyperlipidemia.  5. Mild obstructive sleep apnea status post sleep study by Dr. Gwenette Greet in      2006.  6. History of basilar cerebrovascular accident in 2000.  7. History of tobacco use.  8. History of gout.  9. History of morbid obesity.   REVIEW OF SYSTEMS:  As stated above in the history of present illness,  otherwise negative.   PHYSICAL EXAM:  Today, weight 258 pounds.  Blood pressure 122/75 with a  pulse of 79.  [name] is in no acute  distress.  No jugular vein distention at 45 degree angles.  LUNGS:  Clear to auscultation bilaterally.  CARDIOVASCULAR:  S1, S2.  Regular rate and rhythm.  Pacer site to left upper  chest, no hematoma.  Scar tissue noted at incision, slightly red.  No  drainage.  ABDOMEN:  Soft, nontender, positive bowel sounds.  EXTREMITIES:  Without clubbing, cyanosis, or edema.   IMPRESSION:  The patient with stable class II heart failure status post  implantable cardioverter defibrillator recently.  Will continue current  medications and see the patient back in 6-8 weeks, sooner if he has any  problems.     ______________________________  Rosanne Sack, ACNP    ______________________________  Champ Mungo. Lovena Le, MD   MB/MedQ  DD: 03/02/2006  DT: 03/02/2006  Job #: [id number]

## 2010-09-19 NOTE — Assessment & Plan Note (Signed)
[facility]                            CARDIOLOGY OFFICE NOTE   NAME:WEBBBarok, Bonnie                       MRN:          [id number]  DATE:06/25/2006                            DOB:          12-25-1955    PRIMARY CARE PHYSICIAN:  Dr. Drema Pry.   INTERVAL HISTORY:  Mr. Arney is a delightful 55 year old male with a  history of congestive heart failure secondary to nonischemic  cardiomyopathy, EF previously about 25%.  Most recently EF is up to 40%  - 45%.  He his status post single chamber defibrillator.  He also has a  history of hypertension, obesity, and diabetes.  He returns today for  routine followup.  He has been followed closely in the heart failure  clinic and has had his medications will titrated.   Today he says he feels somewhat fatigued and gets a bit lightheaded when  he stands.  He denies any chest pain, no real shortness of breath,  orthopnea, or PND.  He has been compliant with all his medications, his  weight has been stable.   CURRENT MEDICATIONS:  1. Lasix 80 b.i.d.  2. Plavix 75 a day.  3. BiDil 2 tablets three times a day.  4. Protonix.  5. Coreg 50 b.i.d.  6. Multivitamin.  7. Atacand 30 a day.  8. Vytorin 10/40.  9. Altace 10 mg b.i.d.  10.Norvasc 5 mg a day.  11.Lantus insulin.  12.Baeta.  13.Potassium 40 b.i.d.   PHYSICAL EXAMINATION:  He is well-appearing, ambulates around the clinic  without any respiratory distress.  Blood pressure is 108/80, heart rate  71, weight is 267.  HEENT:  Sclerae anicteric, EOMI, there are no xanthelasmas, mucus  membranes are moist, oropharynx is clear.  NECK:  Supple, no JVD, carotids are 2+ bilaterally without any bruits.  There is no lymphadenopathy or thyromegaly.  CARDIAC:  Regular rate and rhythm, soft S4, no murmur.  LUNGS:  Clear.  ABDOMEN:  Obese, nontender, nondistended, no obvious hepatosplenomegaly,  no bruits, no masses, good bowel sounds.  EXTREMITIES:  Warm with no  cyanosis, clubbing, or edema, no rashes.  NEURO:  Alert and oriented x3, cranial nerves II-XII are intact.  Affect  is very pleasant.  EKG:  Shows normal sinus rhythm with left atrial enlargement, rate of  71.  There is T wave inversion laterally, it is likely due to  repolarization abnormality.   ASSESSMENT/PLAN:  1. Congestive heart failure secondary to a nonischemic cardiomyopathy.      I suspect he may be a bit volume depleted.  We will cut back his      Lasix to 40 mg b.i.d. and also cut his potassium back to 20 mg      b.i.d.  Should his potassium remain low I would consider adding      spironolactone.  2. Hypertension.  Blood pressure is very well controlled, continue      current therapy.  If he continues to run low, may be able to wean      off his Norvasc.  3. Hyperlipidemia.  This is followed  by Dr. Shawna Orleans.   DISPOSITION:  Return to clinic in 1 month to follow up in the heart  failure clinic.     Shaune Pascal. Bensimhon, MD  Electronically Signed    DRB/MedQ  DD: 06/25/2006  DT: 06/25/2006  Job #: [id number]   cc:   Sandy Salaam. Shawna Orleans, DO

## 2010-09-19 NOTE — Op Note (Signed)
NAMEKIRKLAN, BUENO                ACCOUNT NO.:  [id number]   MEDICAL RECORD NO.:  [id number]          PATIENT TYPE:  INP   LOCATION:  2852                         FACILITY:  [facility]   PHYSICIAN:  Champ Mungo. Lovena Le, MD    DATE OF BIRTH:  [birth year]   DATE OF PROCEDURE:  12/18/2005  DATE OF DISCHARGE:                                 OPERATIVE REPORT   PROCEDURE PERFORMED:  Implantation of single chamber defibrillator.   INDICATIONS:  Nonischemic cardiomyopathy class II heart failure, severe LV  dysfunction, EF 25%.   INTRODUCTION:  The patient is a 55 year old male with longstanding  hypertensive/nonischemic cardiomyopathy.  He has class II heart failure  symptoms despite maximal medical therapy with beta blockers, ACE inhibitors,  calcium channel blockers and long-acting nitrates and Lasix.  The patient is  now referred for prophylactic ICD implantation.   PROCEDURE:  After informed consent was obtained, the patient was taken to  the diagnostic EP lab in the fasting state.  Usual the usual preparation and  draping, intravenous fentanyl and midazolam was given for sedation.  Lidocaine 30 mL was infiltrated in the left infraclavicular region.  A 7 cm  incision was carried out over this region.  Electrocautery was utilized to  dissect down to the fascial plane.  The left subclavian vein was  subsequently punctured x1 and the St. Jude Riata, model 7001 active fixation  defibrillation lead, serial number [id number] was advanced into the right  ventricle.  Mapping was carried out and on the final site on the RV septum,  the R-waves measured 14 mV and the pacing impedance 677 ohms and threshold  0.6 volts of 0.5 milliseconds.  A 10 volt pacing did not dilate the  diaphragm with the lead actively fixed.  At this point, the leads were  secured to the subpectoralis fascia with a figure-of-eight silk suture.  The  sewing sleeve was also secured with silk suture.  Electrocautery was then  utilized  to make subcutaneous pocket.  Kanamycin irrigation was utilized to  irrigate the pocket and electrocautery utilized to assure hemostasis.  The  St. Jude Atlas Plus DR model V-193 single chamber defibrillator, serial  number [id number] was connected to the defibrillation lead and placed in the  subcutaneous pocket.  At this point, the patient was deeply sedated with  fentanyl and Versed and his respirations were assisted with respiratory  therapy and defibrillation threshold testing carried out.   After the patient was more deeply sedated with fentanyl and Versed, VF was  induced with a T-wave shock and 15 joule shock was delivered which  terminated VF and restored sinus rhythm.  Five minutes allowed to elapse and  a second DFT test carried out.  Again VF was induced with a T-wave shock and  again a 15 joule shock was delivered which terminated VF and restored sinus  rhythm.  At this point, no additional defibrillation threshold testing was  carried out and the incision was closed with a layer of 2-0 Vicryl, followed  by layer of 3-0 Vicryl, followed by layer of 4-0 Vicryl.  Benzoin  was  painted on the skin, Steri-Strips were applied and a pressure dressing was  placed and the patient was returned to his room in satisfactory condition.   COMPLICATIONS:  There were no immediate procedure complications.   RESULTS:  This demonstrate successful implantation of a St. Jude single  chamber defibrillator in a patient with nonischemic cardiomyopathy, class II  heart failure, ejection fraction 25% (longstanding).           ______________________________  Champ Mungo. Lovena Le, MD     GWT/MEDQ  D:  12/18/2005  T:  12/18/2005  Job:  [id number]   cc:   Shaune Pascal. Bensimhon, MD

## 2010-09-19 NOTE — Assessment & Plan Note (Signed)
[facility]                         ELECTROPHYSIOLOGY OFFICE NOTE   NAME:WEBBKaras, Machnik                       MRN:          [id number]  DATE:08/16/2006                            DOB:          05-14-1955    [name] was seen today in the clinic on August 16, 2006, for followup of  his [place] number [place].  Date of implant was December 18, 2005, for nonischemic cardiomyopathy.  On interrogation of his device  today, his battery voltage is 3.20 with a charge time of 10 seconds.  R  waves measured greater than 12 millivolts with a ventricular pacing  threshold of 0.75 volts at 0.5 milliseconds and a ventricular lead  impedance of 500 ohms.  Shock impedance was 37.  There were no episodes  since last interrogation, no changes were made in his parameters, and he  will be seen again in 4 months' time.      Alma Friendly, LPN  Electronically Signed      Champ Mungo. Lovena Le, MD  Electronically Signed   PO/MedQ  DD: 08/16/2006  DT: 08/16/2006  Job #: [id number]

## 2010-09-19 NOTE — Assessment & Plan Note (Signed)
[facility]                            CARDIOLOGY OFFICE NOTE   NAME:[name], [name]                       MRN:          [id number]  DATE:09/02/2006                            DOB:          [birth year]    PRIMARY CARE PHYSICIAN:  Sandy Salaam. Shawna Orleans, DO   REASON FOR PRESENTATION:  Evaluate patient with cardiomyopathy.   HISTORY OF PRESENT ILLNESS:  Patient is a pleasant 55 year old gentleman  with non-ischemic cardiomyopathy.  He has been followed in the heart  failure clinic.  He has done quite well.  He has had good control of his  blood pressure.  At the last visit, he was to reduce his Lasix.  This is  the second appointment where he was given this instruction.  However, he  did not do this.  He did start taking his Bidil t.i.d. again.  He had  reduced this himself to b.i.d.   Currently, he says he feels well.  He is getting rare headaches.  He is  not having any new dyspnea.  He will get winded climbing a flight of  stairs (class II).  He does not have any resting shortness of breath,  denies any PND or orthopnea.  He has no chest discomfort, neck  discomfort, arm discomfort, activity-induced nausea or vomiting or  excessive diaphoresis.  He has had no palpitations or presyncope.   PAST MEDICAL HISTORY:  Congestive heart failure, secondary non-ischemic  cardiomyopathy, most likely hypertension (EF had been 25%; it improved  to 40-45% on echo in 2007), hypertension, insulin dependent diabetes,  hyperlipidemia, mild obstructive sleep apnea, followed by Dr. Gwenette Greet;  history of a CVA, previous tobacco use, gout, obesity.   ALLERGIES:  None.   MEDICATIONS:  1. Lasix 80 mg b.i.d.  2. Plavix 75 mg daily.  3. Protonix 40 mg daily.  4. Coreg 50 mg b.i.d.  5. Multivitamin.  6. Atacand 32 mg daily.  7. Vytorin 10/40 daily.  8. Altace 10 mg b.i.d.  9. Norvasc 5 mg daily.  10.Lantus.  11.Potassium 40 mEq b.i.d.  12.Bidil 2 pills t.i.d.  13.Byetta 5  mg b.i.d.   REVIEW OF SYSTEMS:  As stated in the HPI, and otherwise negative for  other systems.   PHYSICAL EXAMINATION:  The patient is in no distress.  Blood pressure  103/68, heart rate 77 and regular, weight 263 pounds.  HEENT:  Eyelids unremarkable.  Pupils equal, round and reactive to  light.  Fundi not visualized.  Oral mucosa unremarkable.  NECK:  No jugular venous distention, wave form within normal limits,  carotid upstroke brisk and symmetric.  No bruits, no thyromegaly.  LYMPHATICS:  No adenopathy.  LUNGS:  Clear to auscultation bilaterally.  BACK:  No costovertebral angle tenderness.  CHEST:  Unremarkable.  HEART:  PMI not displaced or sustained.  S1 and S2 within normal limits.  No S3, no S4.  No clicks, no rubs, no murmurs.  ABDOMEN:  Obese, positive bowel sounds, normal in frequency and pitch.  No bruits, rebound, guarding, no midline pulsatile mass, no  hepatomegaly, no splenomegaly.  SKIN:  No rashes, no nodules.  EXTREMITIES:  Two-plus pulses, no edema, no cyanosis, no clubbing.  NEUROLOGIC:  Oriented to person, place and time.  Cranial nerves II  through XII grossly intact.  Motor grossly intact.   ASSESSMENT AND PLAN:  1. Cardiomyopathy:  The patient's ejection fraction has improved.      Management will be treatment of his sleep apnea, as well as, most      importantly, control of his blood pressure.  At this point, I will      not make any change to his medical regimen.  2. Obesity:  We discussed the need to lose weight with diet and      exercise.  3. Hypertension, as above.  4. Followup:  I will see him back in about three months, or sooner, if      needed.     Minus Breeding, MD, [facility]  Electronically Signed    JH/MedQ  DD: 09/02/2006  DT: 09/02/2006  Job #: [id number]   cc:   Sandy Salaam. Shawna Orleans, DO

## 2010-09-19 NOTE — Procedures (Signed)
[name], [name]                ACCOUNT NO.:  [id number]   MEDICAL RECORD NO.:  [id number]          PATIENT TYPE:  OUT   LOCATION:  SLEEP CENTER                 FACILITY:  [facility]   PHYSICIAN:  Danton Sewer, M.D. [facility] DATE OF BIRTH:  04-12-1956   DATE OF STUDY:  07/29/2004                              NOCTURNAL POLYSOMNOGRAM   REFERRING PHYSICIAN:  Dr. Danton Sewer.   INDICATION FOR THE STUDY:  Hypersomnia with sleep apnea. Epworth score: 4.   SLEEP ARCHITECTURE:  The patient had a total sleep time of 327 minutes with  very diminished REM and never achieved slow wave sleep. Sleep efficiency was  only 76%. Sleep onset was normal and REM onset was very prolonged.   IMPRESSION:  1.  Mild obstructive sleep apnea/hypopnea syndrome with a respiratory      disturbance index of 8 events per hour and oxygen desaturation as low as      90%. Most of the events occurred in the supine position. The degree of      sleep apnea may be underestimated in light of very little slow wave      sleep and REM being achieved. The patient did have moderate to loud      snoring throughout the study and 55 nonspecific arousals which may      suggest the upper airway resistance syndrome.  2.  Occasional premature ventricular contraction noted.  3.  No significant periodic leg movements which may disrupt sleep.      KC/MEDQ  D:  08/01/2004 10:15:05  T:  08/01/2004 10:33:44  Job:  [id number]

## 2010-09-19 NOTE — Assessment & Plan Note (Signed)
[facility]                          CHRONIC HEART FAILURE NOTE   NAME:[name], [name]                       MRN:          [id number]  DATE:06/01/2006                            DOB:          14-Nov-1955    ADDENDUM  Recent lab work done on [name] in December 2007 showed a potassium of  3.4, BUN of 12, and a creatinine of 1.0.  The patient's lab work was  reviewed by Richardson Dopp, P.A., who had the patient increase his  potassium to 60 mEq b.i.d. with a repeat BMET to be drawn on Monday,  January 7.  Apparently, the patient did not keep that appointment for  his blood work and has not had it drawn.  I am asking the patient to  return ASAP within the next day or so to have his BMET rechecked.  Also,  the patient underwent an echocardiogram in December that showed his  ejection fraction had improved to 40-45% from his previous 20-25%.  I  will review this with the patient at his next visit and confirm that the  patient is taking his potassium as ordered, as he is on Lasix 80 mg  b.i.d.      Rosanne Sack, ACNP       Minus Breeding, MD, [facility]    MB/MedQ  DD: 06/01/2006  DT: 06/01/2006  Job #: [id number]   cc:   Sandy Salaam. Shawna Orleans, DO

## 2010-09-19 NOTE — Discharge Summary (Signed)
NAMECOSTON, SHIVE                ACCOUNT NO.:  [id number]   MEDICAL RECORD NO.:  [id number]          PATIENT TYPE:  INP   LOCATION:  4707                         FACILITY:  [place]   PHYSICIAN:  Melissa L. Lovena Le, MD  DATE OF BIRTH:  [birth year]   DATE OF ADMISSION:  01/10/2004  DATE OF DISCHARGE:  01/15/2004                                 DISCHARGE SUMMARY   DISCHARGE DIAGNOSES:  1.  Decompensated heart failure.  2.  Newly diagnosed cardiomyopathy.  The patient has previously been treated      for hypertensive cardiomyopathy.  His ejection fraction in 2001 was 40-      50%. In 2003, he had an ejection fraction of 50%. Repeat echocardiogram      on this examination revealed an ejection fraction of 20-30%, decreased      significantly from 2001 and 2003.  3.  Hypertension.  4.  History of embolic basilar cerebrovascular accident in 2000.  5.  Diabetes, stable.   MEDICATIONS AT THE TIME OF DISCHARGE:  1.  Lisinopril 20 mg p.o. b.i.d.  2.  Toprol 100 mg p.o. daily.  3.  Clonidine 0.3 mg p.o. b.i.d.  4.  Plavix 75 mg daily.  5.  Lasix 80 mg b.i.d.  6.  Multivitamin 1 p.o. daily.  7.  Protonix 40 mg daily.  8.  Potassium chloride 40 mg daily.  9.  Zocor 20 mg q.h.s.  10. Actos 45 mg daily.  11. Norvasc 10 mg daily.  12. Nitroglycerin p.r.n. 0.4 mg tab sublingually.   DIET:  As per the congestive heart failure discharge instruction sheet.  Low  salt, 2 g sodium, low fat, limited alcohol intake.   ACTIVITY:  Record daily weights on the same scale.  Avoid straining at  stool.  Avoid any activity that causes chest pain, shortness of breath,  dizziness, sweating, or excessive weakness.   The patient is to follow up with Dr. Percival Spanish at [facility], October 3, at 12 p.m. and is to make an appointment to see  the [facility] physician in 1 week.   HISTORY OF PRESENT ILLNESS:  The patient is a 55 year old African-American  male with a past medical  history for hypertensive cardiomyopathy, who  started to develop shortness of breath a couple of days prior to admission.  He had run out of his medications and had claimed he was going to pick them  up on the day of admission; however, he had worsening shortness of breath,  chest pain, and numbness in the left arm and shoulder.  Arm numbness and  shoulder did not appear acutely, actually appeared 1 week prior to admission  and was similar to pain that he has had before with a pinched nerve.   On the day of admission, the patient's symptoms became worse; therefore, he  came to the emergency room for treatment.  In the emergency room, he was  found to be hypertensive, and his chest x-ray was consistent with congestive  heart failure.  He was given IV Lasix and admitted for rule out myocardial  infarction.  A cardiology consult was called with Dr. Rosezella Florida service and  after evaluating him with 2 D echocardiogram, it was determined that his  ejection fraction had changed significantly in the last 2 years, and he  would undergo cardiac catheterization.  During course of the hospital stay,  the patient's potassium was repleted.  He was placed on lower dose beta  blocker because of his decompensated heart failure and on September 12, he  underwent a cardiac catheterization which showed severe global hypokinesis  with an ejection fraction of 23%.  He had second-degree mitral regurgitation  and no significant aortic insufficiency.  The patient suffered no adverse  events, status post catheterization.  He had his medications titrated and  was started on a different beta blocker which would be more compatible with  his follow up care at [facility].  On September 13, the patient was deemed  stable for discharged to home with follow up with Dr. Percival Spanish in  [facility].  His physical exam remained stable.  Lungs were clear to  auscultation.  Cardiovascular was regular rate, rhythm.  Abdomen is  soft,  nontender, nondistended.  Extremities showed no edema.  Potassium at  discharge was 3.2.   The patient was deemed stable for discharge to the care of his cardiologist  and health care physician.      Meli   MLT/MEDQ  D:  02/13/2004  T:  02/14/2004  Job:  [id number]   cc:   Minus Breeding, M.D.   [facility]

## 2010-09-19 NOTE — Assessment & Plan Note (Signed)
[place]                   COUMADIN / CHRONIC HEART FAILURE CLINIC NOTE   NAME:WEBBDanner, Buzza                       MRN:          [id number]  DATE:12/31/2005                            DOB:          [birth year]    [name] returns today for further evaluation and medication titration of  his congestive heart failure secondary to nonischemic cardiomyopathy,  presumably due to hypertension.  [name] is status post recent  defibrillator placement of a St. Jude Atlas [phone] by Dr. Lovena Le on December 18, 2005.  [name] did fine with the procedure.  He states that he is  feeling very well today.  Denies any symptoms of heart failure, orthopnea,  PND, peripheral edema.  He has no episodes of ICD firing.  He is slightly  hypertensive today.  He states that he just took his blood pressure  medicines immediately prior to coming here this morning for his appointment.   PAST MEDICAL HISTORY:  1. Nonischemic cardiomyopathy/congestive heart failure with an EF of 23%      by cardiac catheterization in 2005.  The most recent echocardiogram      done on February 28th shows an ejection fraction of 20-25%.  Now      patient is status post implantation of an internal defibrillator,      single chamber, St. Jude [employer].  2. History of poorly controlled hypertension.  3. Insulin-dependent diabetes.  4. Hyperlipidemia.  5. Mild obstructive sleep apnea, status post sleep study by Dr. Gwenette Greet in      2006.  6. History of basilar cerebrovascular accident in 2000.  7. History of tobacco use.  8. History of gout.  9. History of morbid obesity.   REVIEW OF SYSTEMS:  As stated above in history of present illness, otherwise  negative.   PHYSICAL EXAMINATION:  VITAL SIGNS:  Weight 259 pounds.  Blood pressure  initially elevated at 164/122.  Manual blood pressure reading of 160/100.  Pulse 77.  GENERAL:  [name] is in no acute distress.  A very pleasant and cooperative  gentleman.  HEENT:  Normocephalic and atraumatic.  Pupils are equal, round and reactive  to light.  NECK:  Supple without lymphadenopathy.  Negative bruits.  Negative JVD.  CHEST:  ICD site at the left chest without redness, edema, or drainage.  LUNGS:  Clear to auscultation bilaterally.  CARDIOVASCULAR:  S1 and S2.  Regular rate and rhythm.  ABDOMEN:  Soft, nontender.  Positive bowel sounds.  EXTREMITIES:  Lower extremities without clubbing, cyanosis or edema.  NEUROLOGIC:  Patient alert and oriented x3.  Cranial nerves II-XII grossly  intact.   IMPRESSION:  1. Stable class II heart failure.  Recent implanted      cardioverter/defibrillator.  2. History of poorly controlled hypertension.  Recently has been very      stable today.  Blood pressure is elevated.  Patient states that he just      took his medications within the last 40 minutes.  I am going to have      patient leave here, return home, and rest a little while.  Check  his      blood pressure with his cuff at home.  He is going to call me back with      the reading.  I am hesitant to increase his Norvasc at this time, as I      have tried to increase it to 10 in the past, and he did not tolerate      it.  Further changes pending blood pressure results.  3. Recent mild elevation of BUN and creatinine.  Creatinine 1.6 on December 19, 2005.  Patient had a follow-up blood work done on December 21, 2005      for total cholesterol and hemoglobin A1C.  He also had a microalbumin      of 0.2, a urine creatinine of 66.1, and a microalbumin/creatinine ratio      of 3.  He was also mildly hypokalemic at 3.7.  I am going to repeat the      patient's BMET today.  I will see him back in six weeks, sooner if      blood pressure is not stable.                                 Rosanne Sack, ACNP                             Shaune Pascal. Bensimhon, MD   MB/MedQ  DD:  12/31/2005  DT:  12/31/2005  Job #:  [id number]

## 2010-10-03 ENCOUNTER — Ambulatory Visit (INDEPENDENT_AMBULATORY_CARE_PROVIDER_SITE_OTHER): Payer: Medicare Other | Admitting: *Deleted

## 2010-10-03 DIAGNOSIS — Z8679 Personal history of other diseases of the circulatory system: Secondary | ICD-10-CM

## 2010-10-03 DIAGNOSIS — I4891 Unspecified atrial fibrillation: Secondary | ICD-10-CM

## 2010-10-03 LAB — POCT INR: INR: 3.8

## 2010-10-09 ENCOUNTER — Other Ambulatory Visit: Payer: Self-pay | Admitting: Cardiology

## 2010-10-15 ENCOUNTER — Telehealth: Payer: Self-pay | Admitting: Cardiology

## 2010-10-15 NOTE — Telephone Encounter (Signed)
Pt has paperwork from the [facility] [facility] [employer] for disability to be filled out.  Pt is to bring the paperwork to the office so that is can be completed.

## 2010-10-15 NOTE — Telephone Encounter (Signed)
Pt has a couple of questions he needs to ask because he has a form to fill out and he needs updated info not to mention a couple of questions

## 2010-10-24 ENCOUNTER — Encounter: Payer: Medicare Other | Admitting: *Deleted

## 2010-10-27 NOTE — Telephone Encounter (Signed)
Pt has completed paperwork

## 2010-10-28 ENCOUNTER — Other Ambulatory Visit: Payer: Self-pay | Admitting: Internal Medicine

## 2010-11-04 ENCOUNTER — Other Ambulatory Visit: Payer: Self-pay | Admitting: Cardiology

## 2010-11-04 ENCOUNTER — Ambulatory Visit (INDEPENDENT_AMBULATORY_CARE_PROVIDER_SITE_OTHER): Payer: Medicare Other | Admitting: *Deleted

## 2010-11-04 DIAGNOSIS — I4891 Unspecified atrial fibrillation: Secondary | ICD-10-CM

## 2010-11-04 DIAGNOSIS — Z8679 Personal history of other diseases of the circulatory system: Secondary | ICD-10-CM

## 2010-11-08 ENCOUNTER — Other Ambulatory Visit: Payer: Self-pay | Admitting: Cardiology

## 2010-11-23 ENCOUNTER — Other Ambulatory Visit: Payer: Self-pay | Admitting: Cardiology

## 2010-11-26 ENCOUNTER — Ambulatory Visit (INDEPENDENT_AMBULATORY_CARE_PROVIDER_SITE_OTHER): Payer: Medicare Other | Admitting: *Deleted

## 2010-11-26 ENCOUNTER — Telehealth: Payer: Self-pay | Admitting: *Deleted

## 2010-11-26 ENCOUNTER — Encounter: Payer: Medicare Other | Admitting: *Deleted

## 2010-11-26 DIAGNOSIS — I428 Other cardiomyopathies: Secondary | ICD-10-CM

## 2010-11-26 DIAGNOSIS — I4891 Unspecified atrial fibrillation: Secondary | ICD-10-CM

## 2010-11-26 MED ORDER — FUROSEMIDE 80 MG PO TABS: 80.0000 mg | ORAL_TABLET | Freq: Two times a day (BID) | ORAL | Status: DC

## 2010-11-26 NOTE — Progress Notes (Signed)
Device checked in device clinic.

## 2010-11-26 NOTE — Telephone Encounter (Signed)
Done

## 2010-12-05 ENCOUNTER — Ambulatory Visit (INDEPENDENT_AMBULATORY_CARE_PROVIDER_SITE_OTHER): Payer: Medicare Other | Admitting: *Deleted

## 2010-12-05 DIAGNOSIS — I4891 Unspecified atrial fibrillation: Secondary | ICD-10-CM

## 2010-12-05 DIAGNOSIS — Z8679 Personal history of other diseases of the circulatory system: Secondary | ICD-10-CM

## 2010-12-05 LAB — POCT INR: INR: 2.7

## 2011-01-02 ENCOUNTER — Ambulatory Visit (INDEPENDENT_AMBULATORY_CARE_PROVIDER_SITE_OTHER): Payer: Medicare Other | Admitting: *Deleted

## 2011-01-02 DIAGNOSIS — Z8679 Personal history of other diseases of the circulatory system: Secondary | ICD-10-CM

## 2011-01-02 DIAGNOSIS — I4891 Unspecified atrial fibrillation: Secondary | ICD-10-CM

## 2011-01-08 ENCOUNTER — Ambulatory Visit: Payer: Self-pay | Admitting: Pulmonary Disease

## 2011-01-16 ENCOUNTER — Telehealth: Payer: Self-pay | Admitting: *Deleted

## 2011-01-16 ENCOUNTER — Ambulatory Visit (INDEPENDENT_AMBULATORY_CARE_PROVIDER_SITE_OTHER): Payer: Medicare Other | Admitting: Cardiology

## 2011-01-16 ENCOUNTER — Encounter: Payer: Self-pay | Admitting: Cardiology

## 2011-01-16 DIAGNOSIS — E669 Obesity, unspecified: Secondary | ICD-10-CM

## 2011-01-16 DIAGNOSIS — I509 Heart failure, unspecified: Secondary | ICD-10-CM

## 2011-01-16 DIAGNOSIS — I4891 Unspecified atrial fibrillation: Secondary | ICD-10-CM

## 2011-01-16 DIAGNOSIS — I10 Essential (primary) hypertension: Secondary | ICD-10-CM

## 2011-01-16 DIAGNOSIS — I2510 Atherosclerotic heart disease of native coronary artery without angina pectoris: Secondary | ICD-10-CM

## 2011-01-16 DIAGNOSIS — T82198A Other mechanical complication of other cardiac electronic device, initial encounter: Secondary | ICD-10-CM

## 2011-01-16 NOTE — Assessment & Plan Note (Signed)
We had a long disucssion about this.  With his cardiomyopathy I have not suggested bariatric surgery which he asked about.  We reviewed diet and exercise advice.  I am sure that he is not as good at counting calories as he should be.

## 2011-01-16 NOTE — Patient Instructions (Signed)
Follow up in 1 year with Dr Hochrein.  You will receive a letter in the mail 2 months before you are due.  Please call us when you receive this letter to schedule your follow up appointment.  The current medical regimen is effective;  continue present plan and medications.  

## 2011-01-16 NOTE — Progress Notes (Signed)
HPI Patient presents for followup his cardiomyopathy.  Since I last saw him he has done well.  The patient denies any new symptoms such as chest discomfort, neck or arm discomfort. There has been no new shortness of breath, PND or orthopnea. There have been no reported palpitations, presyncope or syncope.  He reports that he has been walking on his treadmill 3 x per week.  He has not been able to lose weight.  He says he watches his diet but he admits to eating at [facility].  No Known Allergies  Current Outpatient Prescriptions  Medication Sig Dispense Refill  . allopurinol (ZYLOPRIM) 300 MG tablet Take 300 mg by mouth daily.        [name] amLODipine (NORVASC) 10 MG tablet TAKE 1 TABLET BY MOUTH EVERY DAY  30 tablet  7  . aspirin 81 MG tablet Take 81 mg by mouth daily.        [name] atorvastatin (LIPITOR) 40 MG tablet TAKE ONE TABLET BY MOUTH DAILY.  30 tablet  6  . B-D ULTRAFINE III SHORT PEN 31G X 8 MM MISC USE ONCE DAILY AS DIRECTED  100 each  1  . carvedilol (COREG) 25 MG tablet TAKE 2 TABLETS TWICE A DAY  120 tablet  5  . furosemide (LASIX) 80 MG tablet Take 1 tablet (80 mg total) by mouth 2 (two) times daily.  60 tablet  5  . hydrALAZINE (APRESOLINE) 25 MG tablet TAKE 1 TABLET 3 TIMES A DAY  90 tablet  2  . insulin glargine (LANTUS) 100 UNIT/ML injection 10 units daily       . isosorbide dinitrate (ISORDIL) 20 MG tablet TAKE 1 TABLET BY MOUTH 3 TIMES A DAY  90 tablet  5  . metFORMIN (GLUCOPHAGE) 500 MG tablet Take 500 mg by mouth 2 (two) times daily with a meal.        . Multiple Vitamin (MULTIVITAMIN) capsule Take 1 capsule by mouth daily.        . potassium chloride SA (K-DUR,KLOR-CON) 20 MEQ tablet Take 20 mEq by mouth 3 (three) times daily.        . ramipril (ALTACE) 10 MG capsule TAKE 1 CAPSULE TWICE A DAY  60 capsule  6  . VITAMIN D, CHOLECALCIFEROL, PO Take by mouth daily.        [name] warfarin (COUMADIN) 5 MG tablet Take as directed by Anticoagulation clinic   90 tablet  3    Past Medical  History  Diagnosis Date  . Acute gouty arthropathy   . OSA (obstructive sleep apnea)   . Hyperlipidemia   . CHF (congestive heart failure)      (EF 25%)  . Cerebrovascular accident   . Gout   . Hypokalemia   . HTN (hypertension)   . DM (diabetes mellitus)   . Lipoma   . Dyslipidemia   . Acute, but ill-defined, cerebrovascular disease   . CAD (coronary artery disease)     Past Surgical History  Procedure Date  . Cardiac catheterization     Nonobstructive coronary disease (catheterization in  2009 with left main calcified, LAD with luminal irregularities, circumflex with 60-70% PDA stenosis, the right coronary artery is nondominant with 60% stenosis)  . Cardiac defibrillator placement     ICD-St. Jude   ROS:  As stated in the HPI and negative for all other systems.  PHYSICAL EXAM BP 138/96  Pulse 81  Ht 6' (1.829 m)  Wt 270 lb 6.4 oz (122.653 kg)  BMI 36.67 kg/m2 GENERAL:  Well appearing HEENT:  Pupils equal round and reactive, fundi not visualized, oral mucosa unremarkable NECK:  No jugular venous distention, waveform within normal limits, carotid upstroke brisk and symmetric, no bruits, no thyromegaly LYMPHATICS:  No cervical, inguinal adenopathy LUNGS:  Clear to auscultation bilaterally BACK:  No CVA tenderness CHEST:  Unremarkable HEART:  PMI not displaced or sustained,S1 and S2 within normal limits, no S3, no S4, no clicks, no rubs, no murmurs ABD:  Flat, positive bowel sounds normal in frequency in pitch, no bruits, no rebound, no guarding, no midline pulsatile mass, no hepatomegaly, no splenomegaly EXT:  2 plus pulses throughout, no edema, no cyanosis no clubbing SKIN:  No rashes no nodules NEURO:  Cranial nerves II through XII grossly intact, motor grossly intact throughout PSYCH:  Cognitively intact, oriented to person place and time  EKG:  Sinus rhythm, rate 81, premature ventricular contractions, lateral T-wave inversions, poor anterior R-wave  progression  ASSESSMENT AND PLAN

## 2011-01-16 NOTE — Assessment & Plan Note (Signed)
The patient has no new sypmtoms.  No further cardiovascular testing is indicated.  We will continue with aggressive risk reduction and meds as listed.  

## 2011-01-16 NOTE — Telephone Encounter (Signed)
Checking lead

## 2011-01-16 NOTE — Assessment & Plan Note (Signed)
He is on good meds.  He seems to be euvolemic.  At this point, no change in therapy is indicated.  We have reviewed salt and fluid restrictions.  No further cardiovascular testing is indicated.

## 2011-01-16 NOTE — Assessment & Plan Note (Signed)
The blood pressure is at target. No change in medications is indicated. We will continue with therapeutic lifestyle changes (TLC).  

## 2011-01-17 ENCOUNTER — Other Ambulatory Visit: Payer: Self-pay | Admitting: Internal Medicine

## 2011-01-21 ENCOUNTER — Ambulatory Visit (INDEPENDENT_AMBULATORY_CARE_PROVIDER_SITE_OTHER)
Admission: RE | Admit: 2011-01-21 | Discharge: 2011-01-21 | Disposition: A | Discharge status: Home / Self Care | Payer: Medicare Other | Source: Ambulatory Visit | Attending: Internal Medicine | Admitting: Internal Medicine

## 2011-01-21 DIAGNOSIS — T82198A Other mechanical complication of other cardiac electronic device, initial encounter: Secondary | ICD-10-CM

## 2011-01-28 LAB — COMPREHENSIVE METABOLIC PANEL
AST: 26
Albumin: 4.1
Calcium: 9
Chloride: 104
Creatinine, Ser: 1.08
GFR calc Af Amer: >60
Total Bilirubin: 1.7 — ABNORMAL HIGH
Total Protein: 7.6

## 2011-01-28 LAB — DIFFERENTIAL
Basophils Absolute: 0
Basophils Relative: 1
Eosinophils Absolute: 0.1
Lymphocytes Relative: 26
Lymphs Abs: 1.3
Monocytes Relative: 10
Neutro Abs: 2.9

## 2011-01-28 LAB — CBC
MCV: 93.7
Platelets: 214
RDW: 12.8
WBC: 4.7

## 2011-01-28 LAB — PROTIME-INR
INR: 1.1
Prothrombin Time: 13.9

## 2011-02-04 ENCOUNTER — Ambulatory Visit (INDEPENDENT_AMBULATORY_CARE_PROVIDER_SITE_OTHER): Payer: Medicare Other | Admitting: *Deleted

## 2011-02-04 DIAGNOSIS — Z8679 Personal history of other diseases of the circulatory system: Secondary | ICD-10-CM

## 2011-02-04 DIAGNOSIS — I4891 Unspecified atrial fibrillation: Secondary | ICD-10-CM

## 2011-02-04 LAB — POCT I-STAT 3, VENOUS BLOOD GAS (G3P V)
Acid-Base Excess: 2
Bicarbonate: 27.3 — ABNORMAL HIGH
O2 Saturation: 59
O2 Saturation: 71
O2 Saturation: 72
TCO2: 28
pCO2, Ven: 41 — ABNORMAL LOW
pCO2, Ven: 41.2 — ABNORMAL LOW
pCO2, Ven: 43.2 — ABNORMAL LOW
pH, Ven: 7.428 — ABNORMAL HIGH
pH, Ven: 7.429 — ABNORMAL HIGH
pO2, Ven: 32
pO2, Ven: 37

## 2011-02-04 LAB — POCT I-STAT 3, ART BLOOD GAS (G3+)
Bicarbonate: 26.6 — ABNORMAL HIGH
TCO2: 28
pCO2 arterial: 37.9
pH, Arterial: 7.454 — ABNORMAL HIGH
pO2, Arterial: 79 — ABNORMAL LOW

## 2011-02-04 LAB — BASIC METABOLIC PANEL
CO2: 25
Calcium: 9.4
Creatinine, Ser: 1.05
Glucose, Bld: 125 — ABNORMAL HIGH

## 2011-02-25 ENCOUNTER — Other Ambulatory Visit: Payer: Self-pay

## 2011-02-25 MED ORDER — POTASSIUM CHLORIDE CRYS ER 20 MEQ PO TBCR: 20.0000 meq | EXTENDED_RELEASE_TABLET | Freq: Three times a day (TID) | ORAL | Status: DC

## 2011-02-26 ENCOUNTER — Encounter: Payer: Self-pay | Admitting: Internal Medicine

## 2011-02-26 ENCOUNTER — Telehealth: Payer: Self-pay

## 2011-02-26 ENCOUNTER — Ambulatory Visit (INDEPENDENT_AMBULATORY_CARE_PROVIDER_SITE_OTHER): Payer: Medicare Other | Admitting: *Deleted

## 2011-02-26 DIAGNOSIS — Z95810 Presence of automatic (implantable) cardiac defibrillator: Secondary | ICD-10-CM

## 2011-02-26 DIAGNOSIS — I472 Ventricular tachycardia: Secondary | ICD-10-CM

## 2011-02-26 DIAGNOSIS — I4891 Unspecified atrial fibrillation: Secondary | ICD-10-CM

## 2011-02-26 LAB — ICD DEVICE OBSERVATION
CHARGE TIME: 12 s
RV LEAD AMPLITUDE: 11.8 mv
RV LEAD IMPEDENCE ICD: 510 Ohm
RV LEAD THRESHOLD: 0.75 V
TOT-0008: 0
TOT-0009: 2
TOT-0010: 40
TZAT-0001FASTVT: 1
TZAT-0012SLOWVT: 200 ms
TZAT-0013SLOWVT: 4
TZAT-0018FASTVT: NEGATIVE
TZAT-0018SLOWVT: NEGATIVE
TZAT-0019FASTVT: 7.5 V
TZAT-0020FASTVT: 1 ms
TZAT-0020SLOWVT: 1 ms
TZON-0003FASTVT: 280 ms
TZON-0003SLOWVT: 320 ms
TZON-0004FASTVT: 12
TZON-0005FASTVT: 6
TZON-0005SLOWVT: 6
TZON-0008FASTVT: 80 ms
TZON-0008SLOWVT: 80 ms
TZON-0010FASTVT: 80 ms
TZST-0001FASTVT: 2
TZST-0001FASTVT: 3
TZST-0001FASTVT: 4
TZST-0001SLOWVT: 2
TZST-0001SLOWVT: 5
TZST-0003FASTVT: 25 J
TZST-0003FASTVT: 36 J
TZST-0003SLOWVT: 25 J
TZST-0003SLOWVT: 36 J
VENTRICULAR PACING ICD: 1 pct

## 2011-02-26 MED ORDER — POTASSIUM CHLORIDE CRYS ER 20 MEQ PO TBCR: EXTENDED_RELEASE_TABLET | ORAL | Status: DC

## 2011-02-26 NOTE — Progress Notes (Signed)
icd check in clinic  

## 2011-02-26 NOTE — Telephone Encounter (Signed)
Clarify with pharmacy  K dur 20 meq patient states 2 tablets tid, please advise.

## 2011-02-26 NOTE — Telephone Encounter (Signed)
rx corrected - done per emr

## 2011-03-05 ENCOUNTER — Other Ambulatory Visit: Payer: Self-pay

## 2011-03-05 MED ORDER — WARFARIN SODIUM 5 MG PO TABS: 5.0000 mg | ORAL_TABLET | ORAL | Status: DC

## 2011-03-05 MED ORDER — WARFARIN SODIUM 5 MG PO TABS: ORAL_TABLET | ORAL | Status: DC

## 2011-03-06 ENCOUNTER — Ambulatory Visit (INDEPENDENT_AMBULATORY_CARE_PROVIDER_SITE_OTHER): Payer: Medicare Other | Admitting: *Deleted

## 2011-03-06 DIAGNOSIS — I4891 Unspecified atrial fibrillation: Secondary | ICD-10-CM

## 2011-03-06 DIAGNOSIS — Z7901 Long term (current) use of anticoagulants: Secondary | ICD-10-CM

## 2011-03-06 DIAGNOSIS — Z8679 Personal history of other diseases of the circulatory system: Secondary | ICD-10-CM

## 2011-03-11 ENCOUNTER — Ambulatory Visit (INDEPENDENT_AMBULATORY_CARE_PROVIDER_SITE_OTHER): Payer: Medicare Other | Admitting: Internal Medicine

## 2011-03-11 ENCOUNTER — Encounter: Payer: Self-pay | Admitting: Internal Medicine

## 2011-03-11 ENCOUNTER — Other Ambulatory Visit (INDEPENDENT_AMBULATORY_CARE_PROVIDER_SITE_OTHER): Payer: Medicare Other

## 2011-03-11 VITALS — BP 118/80 | HR 75 | Temp 98.2°F | Ht 72.0 in | Wt 265.2 lb

## 2011-03-11 DIAGNOSIS — E119 Type 2 diabetes mellitus without complications: Secondary | ICD-10-CM

## 2011-03-11 DIAGNOSIS — E785 Hyperlipidemia, unspecified: Secondary | ICD-10-CM

## 2011-03-11 DIAGNOSIS — I10 Essential (primary) hypertension: Secondary | ICD-10-CM

## 2011-03-11 DIAGNOSIS — Z0000 Encounter for general adult medical examination without abnormal findings: Secondary | ICD-10-CM

## 2011-03-11 LAB — BASIC METABOLIC PANEL
BUN: 9 mg/dL (ref 6–23)
Chloride: 108 mEq/L (ref 96–112)
GFR: 98.67 mL/min (ref >60.00)
Potassium: 3.4 mEq/L — ABNORMAL LOW (ref 3.5–5.1)
Sodium: 145 mEq/L (ref 135–145)

## 2011-03-11 LAB — LIPID PANEL
LDL Cholesterol: 81 mg/dL (ref 0–99)
Total CHOL/HDL Ratio: 3
VLDL: 11.2 mg/dL (ref 0.0–40.0)

## 2011-03-11 NOTE — Patient Instructions (Addendum)
You had the flu shot today Continue all other medications as before Please go to LAB in the Basement for the blood and/or urine tests to be done today Please call the phone number [phone] (the [place]) for results of testing in 2-3 days;  When calling, simply dial the number, and when prompted enter the MRN number above (the Medical Record Number) and the # key, then the message should start. Please return in 6 mo with Lab testing done 3-5 days before

## 2011-03-15 ENCOUNTER — Encounter: Payer: Self-pay | Admitting: Internal Medicine

## 2011-03-15 NOTE — Assessment & Plan Note (Signed)
stable overall by hx and exam, most recent data reviewed with pt, and pt to continue medical treatment as before  Lab Results  Component Value Date   HGBA1C 5.6 03/11/2011

## 2011-03-15 NOTE — Progress Notes (Signed)
Subjective:    Patient ID: [name], male    DOB: 1956-02-22, 55 y.o.   MRN: [id number]  HPI  Here to f/u; overall doing ok,  Pt denies chest pain, increased sob or doe, wheezing, orthopnea, PND, increased LE swelling, palpitations, dizziness or syncope.  Pt denies new neurological symptoms such as new headache, or facial or extremity weakness or numbness   Pt denies polydipsia, polyuria, or low sugar symptoms such as weakness or confusion improved with po intake.  Pt states overall good compliance with meds, trying to follow lower cholesterol, diabetic diet, wt overall stable but little exercise however.  CBG's in low 100's.  Denies worsening depressive symptoms, suicidal ideation, or panic, though has ongoing anxiety.    Past Medical History  Diagnosis Date  . Acute gouty arthropathy   . OSA (obstructive sleep apnea)   . Hyperlipidemia   . CHF (congestive heart failure)      (EF 25%)  . Cerebrovascular accident   . Gout   . Hypokalemia   . HTN (hypertension)   . DM (diabetes mellitus)   . Lipoma   . Dyslipidemia   . Acute, but ill-defined, cerebrovascular disease   . CAD (coronary artery disease)    Past Surgical History  Procedure Date  . Cardiac catheterization     Nonobstructive coronary disease (catheterization in  2009 with left main calcified, LAD with luminal irregularities, circumflex with 60-70% PDA stenosis, the right coronary artery is nondominant with 60% stenosis)  . Cardiac defibrillator placement     ICD-St. Jude    reports that he quit smoking about 24 years ago. He does not have any smokeless tobacco history on file. He reports that he drinks alcohol. His drug history not on file. family history is not on file. No Known Allergies Current Outpatient Prescriptions on File Prior to Visit  Medication Sig Dispense Refill  . allopurinol (ZYLOPRIM) 300 MG tablet TAKE 1 TABLET BY MOUTH EVERY DAY  30 tablet  9  . amLODipine (NORVASC) 10 MG tablet TAKE 1 TABLET BY  MOUTH EVERY DAY  30 tablet  7  . aspirin 81 MG tablet Take 81 mg by mouth daily.        [name] atorvastatin (LIPITOR) 40 MG tablet TAKE ONE TABLET BY MOUTH DAILY.  30 tablet  6  . B-D ULTRAFINE III SHORT PEN 31G X 8 MM MISC USE ONCE DAILY AS DIRECTED  100 each  1  . carvedilol (COREG) 25 MG tablet TAKE 2 TABLETS TWICE A DAY  120 tablet  5  . furosemide (LASIX) 80 MG tablet Take 1 tablet (80 mg total) by mouth 2 (two) times daily.  60 tablet  5  . hydrALAZINE (APRESOLINE) 25 MG tablet TAKE 1 TABLET 3 TIMES A DAY  90 tablet  2  . insulin glargine (LANTUS) 100 UNIT/ML injection 10 units daily       . isosorbide dinitrate (ISORDIL) 20 MG tablet TAKE 1 TABLET BY MOUTH 3 TIMES A DAY  90 tablet  5  . metFORMIN (GLUCOPHAGE) 500 MG tablet Take 500 mg by mouth 2 (two) times daily with a meal.        . Multiple Vitamin (MULTIVITAMIN) capsule Take 1 capsule by mouth daily.        . potassium chloride SA (K-DUR,KLOR-CON) 20 MEQ tablet 2 tabs by mouth three times daily  180 tablet  11  . ramipril (ALTACE) 10 MG capsule TAKE 1 CAPSULE TWICE A DAY  60 capsule  6  . VITAMIN D, CHOLECALCIFEROL, PO Take by mouth daily.        [name] warfarin (COUMADIN) 5 MG tablet Take 1 tablet (5 mg total) by mouth as directed.  90 tablet  3   Review of Systems Review of Systems  Constitutional: Negative for diaphoresis and unexpected weight change.  HENT: Negative for drooling and tinnitus.   Eyes: Negative for photophobia and visual disturbance.  Respiratory: Negative for choking and stridor.   Gastrointestinal: Negative for vomiting and blood in stool.  Genitourinary: Negative for hematuria and decreased urine volume.    Objective:   Physical Exam BP 118/80  Pulse 75  Temp(Src) 98.2 F (36.8 C) (Oral)  Ht 6' (1.829 m)  Wt 265 lb 4 oz (120.317 kg)  BMI 35.97 kg/m2  SpO2 96% Physical Exam  VS noted Constitutional: Pt appears well-developed and well-nourished.  HENT: Head: Normocephalic.  Right Ear: External ear normal.    Left Ear: External ear normal.  Eyes: Conjunctivae and EOM are normal. Pupils are equal, round, and reactive to light.  Neck: Normal range of motion. Neck supple.  Cardiovascular: Normal rate and regular rhythm.   Pulmonary/Chest: Effort normal and breath sounds normal.  Neurological: Pt is alert. No cranial nerve deficit.  Skin: Skin is warm. No erythema.  Psychiatric: Pt behavior is normal. Thought content normal.         Assessment & Plan:

## 2011-03-15 NOTE — Assessment & Plan Note (Signed)
stable overall by hx and exam, most recent data reviewed with pt, and pt to continue medical treatment as before  Lab Results  Component Value Date   LDLCALC 81 03/11/2011

## 2011-03-15 NOTE — Assessment & Plan Note (Signed)
stable overall by hx and exam, most recent data reviewed with pt, and pt to continue medical treatment as before  BP Readings from Last 3 Encounters:  03/11/11 118/80  01/16/11 138/96  09/08/10 102/62

## 2011-04-08 ENCOUNTER — Other Ambulatory Visit: Payer: Self-pay | Admitting: Internal Medicine

## 2011-04-15 ENCOUNTER — Other Ambulatory Visit: Payer: Self-pay | Admitting: *Deleted

## 2011-04-15 MED ORDER — AMLODIPINE BESYLATE 10 MG PO TABS: 10.0000 mg | ORAL_TABLET | Freq: Every day | ORAL | Status: DC

## 2011-04-17 ENCOUNTER — Ambulatory Visit (INDEPENDENT_AMBULATORY_CARE_PROVIDER_SITE_OTHER): Payer: Medicare Other | Admitting: *Deleted

## 2011-04-17 DIAGNOSIS — I4891 Unspecified atrial fibrillation: Secondary | ICD-10-CM

## 2011-04-17 DIAGNOSIS — Z7901 Long term (current) use of anticoagulants: Secondary | ICD-10-CM

## 2011-04-17 DIAGNOSIS — Z8679 Personal history of other diseases of the circulatory system: Secondary | ICD-10-CM

## 2011-04-17 LAB — POCT INR: INR: 1.9

## 2011-04-23 ENCOUNTER — Other Ambulatory Visit: Payer: Self-pay | Admitting: Internal Medicine

## 2011-04-23 ENCOUNTER — Other Ambulatory Visit: Payer: Self-pay | Admitting: Cardiology

## 2011-05-07 ENCOUNTER — Other Ambulatory Visit: Payer: Self-pay | Admitting: Cardiology

## 2011-05-10 ENCOUNTER — Other Ambulatory Visit: Payer: Self-pay | Admitting: Internal Medicine

## 2011-05-19 ENCOUNTER — Ambulatory Visit (INDEPENDENT_AMBULATORY_CARE_PROVIDER_SITE_OTHER): Payer: Medicare Other | Admitting: *Deleted

## 2011-05-19 DIAGNOSIS — Z7901 Long term (current) use of anticoagulants: Secondary | ICD-10-CM

## 2011-05-19 DIAGNOSIS — I4891 Unspecified atrial fibrillation: Secondary | ICD-10-CM

## 2011-05-19 DIAGNOSIS — Z8679 Personal history of other diseases of the circulatory system: Secondary | ICD-10-CM

## 2011-05-19 LAB — POCT INR: INR: 2.3

## 2011-06-01 ENCOUNTER — Encounter: Payer: Self-pay | Admitting: Internal Medicine

## 2011-06-01 ENCOUNTER — Ambulatory Visit (INDEPENDENT_AMBULATORY_CARE_PROVIDER_SITE_OTHER): Payer: Medicare Other | Admitting: *Deleted

## 2011-06-01 ENCOUNTER — Encounter: Payer: Medicare Other | Admitting: *Deleted

## 2011-06-01 DIAGNOSIS — I428 Other cardiomyopathies: Secondary | ICD-10-CM

## 2011-06-01 DIAGNOSIS — I509 Heart failure, unspecified: Secondary | ICD-10-CM

## 2011-06-01 LAB — ICD DEVICE OBSERVATION
BATTERY VOLTAGE: 2.57 V
CHARGE TIME: 12.3 s
DEVICE MODEL ICD: 311254
RV LEAD THRESHOLD: 1 V
TOT-0006: 20121025000000
TOT-0008: 0
TZAT-0001FASTVT: 1
TZAT-0001SLOWVT: 1
TZAT-0004FASTVT: 8
TZAT-0018FASTVT: NEGATIVE
TZAT-0020SLOWVT: 1 ms
TZON-0003FASTVT: 280 ms
TZON-0005SLOWVT: 6
TZON-0008SLOWVT: 80 ms
TZON-0010FASTVT: 80 ms
TZST-0001FASTVT: 3
TZST-0001FASTVT: 5
TZST-0001SLOWVT: 2
TZST-0001SLOWVT: 4
TZST-0003FASTVT: 25 J
TZST-0003FASTVT: 36 J
TZST-0003SLOWVT: 15 J
TZST-0003SLOWVT: 25 J
VENTRICULAR PACING ICD: 1 pct

## 2011-06-01 NOTE — Progress Notes (Signed)
ICD check

## 2011-06-04 ENCOUNTER — Encounter: Payer: Medicare Other | Admitting: *Deleted

## 2011-06-14 ENCOUNTER — Other Ambulatory Visit: Payer: Self-pay | Admitting: Cardiology

## 2011-06-15 ENCOUNTER — Other Ambulatory Visit: Payer: Self-pay | Admitting: Cardiology

## 2011-06-19 ENCOUNTER — Ambulatory Visit (INDEPENDENT_AMBULATORY_CARE_PROVIDER_SITE_OTHER): Payer: Medicare Other

## 2011-06-19 DIAGNOSIS — Z8679 Personal history of other diseases of the circulatory system: Secondary | ICD-10-CM

## 2011-06-19 DIAGNOSIS — I4891 Unspecified atrial fibrillation: Secondary | ICD-10-CM

## 2011-06-19 DIAGNOSIS — Z7901 Long term (current) use of anticoagulants: Secondary | ICD-10-CM

## 2011-06-19 LAB — POCT INR: INR: 2.3

## 2011-07-11 ENCOUNTER — Other Ambulatory Visit: Payer: Self-pay | Admitting: Cardiology

## 2011-07-12 ENCOUNTER — Other Ambulatory Visit: Payer: Self-pay | Admitting: Internal Medicine

## 2011-07-17 ENCOUNTER — Ambulatory Visit (INDEPENDENT_AMBULATORY_CARE_PROVIDER_SITE_OTHER): Payer: Medicare Other | Admitting: *Deleted

## 2011-07-17 DIAGNOSIS — I4891 Unspecified atrial fibrillation: Secondary | ICD-10-CM

## 2011-07-17 DIAGNOSIS — Z8679 Personal history of other diseases of the circulatory system: Secondary | ICD-10-CM

## 2011-07-17 DIAGNOSIS — Z7901 Long term (current) use of anticoagulants: Secondary | ICD-10-CM

## 2011-07-17 LAB — POCT INR: INR: 3.4

## 2011-08-14 ENCOUNTER — Other Ambulatory Visit: Payer: Self-pay

## 2011-08-14 MED ORDER — WARFARIN SODIUM 5 MG PO TABS: ORAL_TABLET | ORAL | Status: DC

## 2011-08-14 MED ORDER — POTASSIUM CHLORIDE CRYS ER 20 MEQ PO TBCR: EXTENDED_RELEASE_TABLET | ORAL | Status: DC

## 2011-08-14 MED ORDER — ALLOPURINOL 300 MG PO TABS: 300.0000 mg | ORAL_TABLET | Freq: Every day | ORAL | Status: DC

## 2011-08-18 ENCOUNTER — Ambulatory Visit (INDEPENDENT_AMBULATORY_CARE_PROVIDER_SITE_OTHER): Payer: Medicare Other

## 2011-08-18 DIAGNOSIS — Z8679 Personal history of other diseases of the circulatory system: Secondary | ICD-10-CM

## 2011-08-18 DIAGNOSIS — I4891 Unspecified atrial fibrillation: Secondary | ICD-10-CM

## 2011-08-18 DIAGNOSIS — Z7901 Long term (current) use of anticoagulants: Secondary | ICD-10-CM

## 2011-08-18 LAB — POCT INR: INR: 2.2

## 2011-08-19 ENCOUNTER — Other Ambulatory Visit: Payer: Self-pay

## 2011-08-19 MED ORDER — METFORMIN HCL 500 MG PO TABS: 500.0000 mg | ORAL_TABLET | Freq: Two times a day (BID) | ORAL | Status: DC

## 2011-08-27 ENCOUNTER — Encounter: Payer: Medicare Other | Admitting: Internal Medicine

## 2011-09-04 ENCOUNTER — Other Ambulatory Visit: Payer: Self-pay

## 2011-09-04 MED ORDER — ATORVASTATIN CALCIUM 40 MG PO TABS: 40.0000 mg | ORAL_TABLET | Freq: Every day | ORAL | Status: DC

## 2011-09-04 MED ORDER — POTASSIUM CHLORIDE CRYS ER 20 MEQ PO TBCR: EXTENDED_RELEASE_TABLET | ORAL | Status: DC

## 2011-09-08 ENCOUNTER — Encounter: Payer: Self-pay | Admitting: Internal Medicine

## 2011-09-08 ENCOUNTER — Other Ambulatory Visit (INDEPENDENT_AMBULATORY_CARE_PROVIDER_SITE_OTHER): Payer: Medicare Other

## 2011-09-08 ENCOUNTER — Ambulatory Visit (INDEPENDENT_AMBULATORY_CARE_PROVIDER_SITE_OTHER): Payer: Medicare Other | Admitting: Internal Medicine

## 2011-09-08 ENCOUNTER — Ambulatory Visit: Payer: Medicare Other | Admitting: Internal Medicine

## 2011-09-08 VITALS — BP 102/70 | HR 85 | Temp 97.1°F | Ht 72.0 in | Wt 268.2 lb

## 2011-09-08 DIAGNOSIS — E119 Type 2 diabetes mellitus without complications: Secondary | ICD-10-CM

## 2011-09-08 DIAGNOSIS — R609 Edema, unspecified: Secondary | ICD-10-CM

## 2011-09-08 DIAGNOSIS — Z125 Encounter for screening for malignant neoplasm of prostate: Secondary | ICD-10-CM

## 2011-09-08 DIAGNOSIS — Z0000 Encounter for general adult medical examination without abnormal findings: Secondary | ICD-10-CM

## 2011-09-08 DIAGNOSIS — E876 Hypokalemia: Secondary | ICD-10-CM

## 2011-09-08 DIAGNOSIS — M109 Gout, unspecified: Secondary | ICD-10-CM

## 2011-09-08 DIAGNOSIS — E785 Hyperlipidemia, unspecified: Secondary | ICD-10-CM

## 2011-09-08 LAB — URINALYSIS, ROUTINE W REFLEX MICROSCOPIC
Leukocytes, UA: NEGATIVE
Nitrite: NEGATIVE
Specific Gravity, Urine: 1.01 (ref 1.000–1.030)
Urobilinogen, UA: 1 (ref 0.0–1.0)
pH: 7 (ref 5.0–8.0)

## 2011-09-08 LAB — HEMOGLOBIN A1C: Hgb A1c MFr Bld: 5.6 % (ref 4.6–6.5)

## 2011-09-08 LAB — HEPATIC FUNCTION PANEL
AST: 22 U/L (ref 0–37)
Albumin: 3.7 g/dL (ref 3.5–5.2)
Alkaline Phosphatase: 59 U/L (ref 39–117)
Total Protein: 7.1 g/dL (ref 6.0–8.3)

## 2011-09-08 LAB — LIPID PANEL
Cholesterol: 153 mg/dL (ref 0–200)
HDL: 46 mg/dL (ref >39.00)
VLDL: 13 mg/dL (ref 0.0–40.0)

## 2011-09-08 LAB — CBC WITH DIFFERENTIAL/PLATELET
Basophils Absolute: 0.1 10*3/uL (ref 0.0–0.1)
Eosinophils Relative: 1.1 % (ref 0.0–5.0)
Hemoglobin: 13.8 g/dL (ref 13.0–17.0)
Lymphocytes Relative: 18.4 % (ref 12.0–46.0)
Monocytes Relative: 8.3 % (ref 3.0–12.0)
Platelets: 177 10*3/uL (ref 150.0–400.0)
RDW: 14.1 % (ref 11.5–14.6)
WBC: 6.5 10*3/uL (ref 4.5–10.5)

## 2011-09-08 LAB — BASIC METABOLIC PANEL
BUN: 14 mg/dL (ref 6–23)
CO2: 28 mEq/L (ref 19–32)
Calcium: 8.9 mg/dL (ref 8.4–10.5)
GFR: 85.65 mL/min (ref >60.00)
Glucose, Bld: 113 mg/dL — ABNORMAL HIGH (ref 70–99)
Sodium: 144 mEq/L (ref 135–145)

## 2011-09-08 LAB — TSH: TSH: 0.92 u[IU]/mL (ref 0.35–5.50)

## 2011-09-08 LAB — PSA: PSA: 0.48 ng/mL (ref 0.10–4.00)

## 2011-09-08 LAB — MICROALBUMIN / CREATININE URINE RATIO: Microalb Creat Ratio: 1.7 mg/g (ref 0.0–30.0)

## 2011-09-08 MED ORDER — INSULIN GLARGINE 100 UNIT/ML ~~LOC~~ SOLN: 10.0000 [IU] | Freq: Every day | SUBCUTANEOUS | Status: DC

## 2011-09-08 NOTE — Progress Notes (Signed)
Subjective:    Patient ID: [name], male    DOB: Apr 24, 1956, 56 y.o.   MRN: [id number]  HPI  Here for wellness and f/u;  Overall doing ok;  Pt denies CP, worsening SOB, DOE, wheezing, orthopnea, PND, palpitations, dizziness or syncope but has had mild worsening periph/pedal edema for the past month, wtihout incresaed po fluids or missed lasix.  Pt denies neurological change such as new Headache, facial or extremity weakness.  Pt denies polydipsia, polyuria, or low sugar symptoms. Pt states overall good compliance with treatment and medications, good tolerability, and trying to follow lower cholesterol diet.  Pt denies worsening depressive symptoms, suicidal ideation or panic. No fever, wt loss, night sweats, loss of appetite, or other constitutional symptoms.  Pt states good ability with ADL's, low fall risk, home safety reviewed and adequate, no significant changes in hearing or vision, and occasionally active with exercise. No other new complaints. Needs DM shoes - has poor circulation today with venous insufficiency type edema to the legs as above Past Medical History  Diagnosis Date  . Acute gouty arthropathy   . OSA (obstructive sleep apnea)   . Hyperlipidemia   . CHF (congestive heart failure)      (EF 25%)  . Cerebrovascular accident   . Gout   . Hypokalemia   . HTN (hypertension)   . DM (diabetes mellitus)   . Lipoma   . Dyslipidemia   . Acute, but ill-defined, cerebrovascular disease   . CAD (coronary artery disease)    Past Surgical History  Procedure Date  . Cardiac catheterization     Nonobstructive coronary disease (catheterization in  2009 with left main calcified, LAD with luminal irregularities, circumflex with 60-70% PDA stenosis, the right coronary artery is nondominant with 60% stenosis)  . Cardiac defibrillator placement     ICD-St. Jude    reports that he quit smoking about 25 years ago. He does not have any smokeless tobacco history on file. He reports that  he drinks alcohol. His drug history not on file. family history is not on file. No Known Allergies Current Outpatient Prescriptions on File Prior to Visit  Medication Sig Dispense Refill  . allopurinol (ZYLOPRIM) 300 MG tablet Take 1 tablet (300 mg total) by mouth daily.  90 tablet  1  . amLODipine (NORVASC) 10 MG tablet Take 1 tablet (10 mg total) by mouth daily.  30 tablet  7  . aspirin 81 MG tablet Take 81 mg by mouth daily.        [name] atorvastatin (LIPITOR) 40 MG tablet Take 1 tablet (40 mg total) by mouth daily.  90 tablet  1  . B-D ULTRAFINE III SHORT PEN 31G X 8 MM MISC USE ONCE DAILY AS DIRECTED  100 each  3  . carvedilol (COREG) 25 MG tablet TAKE 2 TABLETS TWICE A DAY  120 tablet  5  . furosemide (LASIX) 80 MG tablet TAKE 1 TABLET (80 MG TOTAL) BY MOUTH 2 (TWO) TIMES DAILY.  60 tablet  5  . hydrALAZINE (APRESOLINE) 25 MG tablet TAKE 1 TABLET 3 TIMES A DAY  90 tablet  2  . isosorbide dinitrate (ISORDIL) 20 MG tablet TAKE 1 TABLET BY MOUTH 3 TIMES A DAY  90 tablet  5  . metFORMIN (GLUCOPHAGE) 500 MG tablet Take 1 tablet (500 mg total) by mouth 2 (two) times daily.  180 tablet  3  . Multiple Vitamin (MULTIVITAMIN) capsule Take 1 capsule by mouth daily.        [name]  potassium chloride SA (K-DUR,KLOR-CON) 20 MEQ tablet 2 tabs by mouth three times daily  540 tablet  2  . ramipril (ALTACE) 10 MG capsule TAKE 1 CAPSULE TWICE A DAY  60 capsule  6  . VITAMIN D, CHOLECALCIFEROL, PO Take by mouth daily.        [name] warfarin (COUMADIN) 5 MG tablet Take as directed by anticoagulation clinic  270 tablet  1  . DISCONTD: LANTUS SOLOSTAR 100 UNIT/ML injection USE AS 10 UNITS SUB-Q ONCE DAILY  15 Syringe  1   Review of Systems Review of Systems  Constitutional: Negative for diaphoresis, activity change, appetite change and unexpected weight change.  HENT: Negative for hearing loss, ear pain, facial swelling, mouth sores and neck stiffness.   Eyes: Negative for pain, redness and visual disturbance.    Respiratory: Negative for shortness of breath and wheezing.   Cardiovascular: Negative for chest pain and palpitations.  Gastrointestinal: Negative for diarrhea, blood in stool, abdominal distention and rectal pain.  Genitourinary: Negative for hematuria, flank pain and decreased urine volume.  Musculoskeletal: Negative for myalgias and joint swelling.  Skin: Negative for color change and wound.  Neurological: Negative for syncope and numbness.  Hematological: Negative for adenopathy.  Psychiatric/Behavioral: Negative for hallucinations, self-injury, decreased concentration and agitation.     Objective:   Physical Exam BP 102/70  Pulse 85  Temp(Src) 97.1 F (36.2 C) (Oral)  Ht 6' (1.829 m)  Wt 268 lb 4 oz (121.677 kg)  BMI 36.38 kg/m2  SpO2 95% Physical Exam  VS noted Constitutional: Pt is oriented to person, place, and time. Appears well-developed and well-nourished.  HENT:  Head: Normocephalic and atraumatic.  Right Ear: External ear normal.  Left Ear: External ear normal.  Nose: Nose normal.  Mouth/Throat: Oropharynx is clear and moist.  Eyes: Conjunctivae and EOM are normal. Pupils are equal, round, and reactive to light.  Neck: Normal range of motion. Neck supple. No JVD present. No tracheal deviation present.  Cardiovascular: Normal rate, regular rhythm, normal heart sounds and intact distal pulses.   Pulmonary/Chest: Effort normal and breath sounds normal.  Abdominal: Soft. Bowel sounds are normal. There is no tenderness.  Musculoskeletal: Normal range of motion. Exhibits trace to 1+ pedal edema left > right with leg varicosities.  Lymphadenopathy:  Has no cervical adenopathy.  Neurological: Pt is alert and oriented to person, place, and time. Pt has normal reflexes. No cranial nerve deficit.  Skin: Skin is warm and dry. No rash noted.  Psychiatric:  Has  normal mood and affect. Behavior is normal.     Assessment & Plan:

## 2011-09-08 NOTE — Patient Instructions (Addendum)
Continue all other medications as before, your lantus was refilled today You are given the precription and notes relating poor circulation today Please also elevate your legs when you sit, avoid salt in the diet, and consider compression stockings (non prescription grade) at the [name] on [address] Please have the pharmacy call with any refills you may need. Please go to LAB in the Basement for the blood and/or urine tests to be done today You will be contacted by phone if any changes need to be made immediately.  Otherwise, you will receive a letter about your results with an explanation. Please return in 6 mo with Lab testing done 3-5 days before

## 2011-09-08 NOTE — Assessment & Plan Note (Signed)
Overall doing well, age appropriate education and counseling updated, referrals for preventative services and immunizations addressed, dietary and smoking counseling addressed, most recent labs and ECG reviewed.  I have personally reviewed and have noted: 1) the patient's medical and social history 2) The pt's use of alcohol, tobacco, and illicit drugs 3) The patient's current medications and supplements 4) Functional ability including ADL's, fall risk, home safety risk, hearing and visual impairment 5) Diet and physical activities 6) Evidence for depression or mood disorder 7) The patient's height, weight, and BMI have been recorded in the chart I have made referrals, and provided counseling and education based on review of the above O/w up to date with prevention

## 2011-09-08 NOTE — Assessment & Plan Note (Signed)
stable overall by hx and exam, most recent data reviewed with pt, and pt to continue medical treatment as before, but given the poor circulation today will need DM shoes

## 2011-09-08 NOTE — Assessment & Plan Note (Signed)
Also for compression stockings, low salt diet, leg elevation when sitting

## 2011-09-18 ENCOUNTER — Ambulatory Visit (INDEPENDENT_AMBULATORY_CARE_PROVIDER_SITE_OTHER): Payer: Medicare Other | Admitting: Internal Medicine

## 2011-09-18 ENCOUNTER — Encounter: Payer: Self-pay | Admitting: Internal Medicine

## 2011-09-18 ENCOUNTER — Ambulatory Visit (INDEPENDENT_AMBULATORY_CARE_PROVIDER_SITE_OTHER): Payer: Medicare Other

## 2011-09-18 VITALS — BP 118/82 | HR 68 | Ht 72.0 in | Wt 265.0 lb

## 2011-09-18 DIAGNOSIS — Z8679 Personal history of other diseases of the circulatory system: Secondary | ICD-10-CM

## 2011-09-18 DIAGNOSIS — I4891 Unspecified atrial fibrillation: Secondary | ICD-10-CM

## 2011-09-18 DIAGNOSIS — I509 Heart failure, unspecified: Secondary | ICD-10-CM

## 2011-09-18 DIAGNOSIS — I472 Ventricular tachycardia: Secondary | ICD-10-CM

## 2011-09-18 DIAGNOSIS — Z7901 Long term (current) use of anticoagulants: Secondary | ICD-10-CM

## 2011-09-18 DIAGNOSIS — Z95810 Presence of automatic (implantable) cardiac defibrillator: Secondary | ICD-10-CM

## 2011-09-18 LAB — ICD DEVICE OBSERVATION
BRDY-0002RV: 40 {beats}/min
DEVICE MODEL ICD: 311254
RV LEAD AMPLITUDE: 12 mv
RV LEAD IMPEDENCE ICD: 470 Ohm
TZAT-0004SLOWVT: 8
TZAT-0012SLOWVT: 200 ms
TZAT-0013FASTVT: 2
TZAT-0013SLOWVT: 4
TZAT-0018SLOWVT: NEGATIVE
TZAT-0019FASTVT: 7.5 V
TZAT-0019SLOWVT: 7.5 V
TZAT-0020FASTVT: 1 ms
TZON-0003SLOWVT: 320 ms
TZON-0004FASTVT: 12
TZON-0004SLOWVT: 12
TZON-0005FASTVT: 6
TZON-0008FASTVT: 80 ms
TZST-0001FASTVT: 2
TZST-0001FASTVT: 4
TZST-0001SLOWVT: 3
TZST-0001SLOWVT: 5
TZST-0003FASTVT: 36 J
TZST-0003FASTVT: 36 J
TZST-0003SLOWVT: 36 J
TZST-0003SLOWVT: 36 J
VENTRICULAR PACING ICD: 0 pct

## 2011-09-18 NOTE — Patient Instructions (Addendum)
Your physician recommends that you schedule a follow-up appointment in: 3 months with device clinic in 6 months with Dr Lovena Le

## 2011-09-23 ENCOUNTER — Encounter: Payer: Self-pay | Admitting: Internal Medicine

## 2011-09-23 NOTE — Progress Notes (Signed)
HPI [name] returns today for followup. He is a very pleasant middle-age man with coronary disease but a nonischemic cardiomyopathy, status post ICD implantation. The patient experienced an episode of ventricular fibrillation for which he received an appropriate ICD shock several weeks ago. He has not had chest pain or worsening heart failure symptoms. He denies any warning that he was about to be shocked. He denies medical or dietary noncompliance.  No Known Allergies   Current Outpatient Prescriptions  Medication Sig Dispense Refill  . allopurinol (ZYLOPRIM) 300 MG tablet Take 1 tablet (300 mg total) by mouth daily.  90 tablet  1  . amLODipine (NORVASC) 10 MG tablet Take 1 tablet (10 mg total) by mouth daily.  30 tablet  7  . aspirin 81 MG tablet Take 81 mg by mouth daily.        [name] atorvastatin (LIPITOR) 40 MG tablet Take 1 tablet (40 mg total) by mouth daily.  90 tablet  1  . B-D ULTRAFINE III SHORT PEN 31G X 8 MM MISC USE ONCE DAILY AS DIRECTED  100 each  3  . carvedilol (COREG) 25 MG tablet TAKE 2 TABLETS TWICE A DAY  120 tablet  5  . furosemide (LASIX) 80 MG tablet TAKE 1 TABLET (80 MG TOTAL) BY MOUTH 2 (TWO) TIMES DAILY.  60 tablet  5  . hydrALAZINE (APRESOLINE) 25 MG tablet TAKE 1 TABLET 3 TIMES A DAY  90 tablet  2  . insulin glargine (LANTUS SOLOSTAR) 100 UNIT/ML injection Inject 10 Units into the skin daily.  15 mL  5  . isosorbide dinitrate (ISORDIL) 20 MG tablet TAKE 1 TABLET BY MOUTH 3 TIMES A DAY  90 tablet  5  . metFORMIN (GLUCOPHAGE) 500 MG tablet Take 1 tablet (500 mg total) by mouth 2 (two) times daily.  180 tablet  3  . Multiple Vitamin (MULTIVITAMIN) capsule Take 1 capsule by mouth daily.        . potassium chloride SA (K-DUR,KLOR-CON) 20 MEQ tablet 2 tabs by mouth three times daily  540 tablet  2  . ramipril (ALTACE) 10 MG capsule TAKE 1 CAPSULE TWICE A DAY  60 capsule  6  . VITAMIN D, CHOLECALCIFEROL, PO Take by mouth daily.        [name] warfarin (COUMADIN) 5 MG tablet Take  as directed by anticoagulation clinic  270 tablet  1     Past Medical History  Diagnosis Date  . Acute gouty arthropathy   . OSA (obstructive sleep apnea)   . Hyperlipidemia   . CHF (congestive heart failure)      (EF 25%)  . Cerebrovascular accident   . Gout   . Hypokalemia   . HTN (hypertension)   . DM (diabetes mellitus)   . Lipoma   . Dyslipidemia   . Acute, but ill-defined, cerebrovascular disease   . CAD (coronary artery disease)     ROS:   All systems reviewed and negative except as noted in the HPI.   Past Surgical History  Procedure Date  . Cardiac catheterization     Nonobstructive coronary disease (catheterization in  2009 with left main calcified, LAD with luminal irregularities, circumflex with 60-70% PDA stenosis, the right coronary artery is nondominant with 60% stenosis)  . Cardiac defibrillator placement     ICD-St. Jude     No family history on file.   History   Social History  . Marital Status: Married    Spouse Name: N/A    Number  of Children: N/A  . Years of Education: N/A   Occupational History  . Not on file.   Social History Main Topics  . Smoking status: Former Smoker    Quit date: 05/04/1986  . Smokeless tobacco: Not on file   Comment: Quit smoking 30 yrs ago. Smoked as teenager less than 1/2 ppd. smoked x 4 years.  . Alcohol Use: Yes  . Drug Use: Not on file  . Sexually Active: Not on file   Other Topics Concern  . Not on file   Social History Narrative  . No narrative on file     BP 118/82  Pulse 68  Ht 6' (1.829 m)  Wt 265 lb (120.203 kg)  BMI 35.94 kg/m2  Physical Exam:  Well appearing NAD HEENT: Unremarkable Neck:  No JVD, no thyromegally Lungs:  Clear with no wheezes, rales, or rhonchi. HEART:  Regular rate rhythm, no murmurs, no rubs, no clicks Abd:  soft, positive bowel sounds, no organomegally, no rebound, no guarding Ext:  2 plus pulses, no edema, no cyanosis, no clubbing Skin:  No rashes no  nodules Neuro:  CN II through XII intact, motor grossly intact  DEVICE  Normal device function.  See PaceArt for details.   Assess/Plan:

## 2011-09-23 NOTE — Assessment & Plan Note (Signed)
He has had one episode of ventricular fibrillation since his last visit which was appropriately defibrillator. At this point I will not start the patient on antiarrhythmic drug therapy, but would consider doing so if he has recurrent VT or VF.

## 2011-09-23 NOTE — Assessment & Plan Note (Signed)
His device is working normally. We'll recheck in several months.

## 2011-09-23 NOTE — Assessment & Plan Note (Signed)
He appears to be maintaining sinus rhythm. He will continue his current medical therapy.

## 2011-10-03 ENCOUNTER — Other Ambulatory Visit: Payer: Self-pay | Admitting: Internal Medicine

## 2011-10-03 ENCOUNTER — Other Ambulatory Visit: Payer: Self-pay | Admitting: Cardiology

## 2011-10-05 ENCOUNTER — Ambulatory Visit (INDEPENDENT_AMBULATORY_CARE_PROVIDER_SITE_OTHER): Payer: Medicare Other

## 2011-10-05 DIAGNOSIS — Z7901 Long term (current) use of anticoagulants: Secondary | ICD-10-CM

## 2011-10-05 DIAGNOSIS — Z8679 Personal history of other diseases of the circulatory system: Secondary | ICD-10-CM

## 2011-10-05 DIAGNOSIS — I4891 Unspecified atrial fibrillation: Secondary | ICD-10-CM

## 2011-10-05 LAB — POCT INR: INR: 2.1

## 2011-10-31 ENCOUNTER — Other Ambulatory Visit: Payer: Self-pay | Admitting: Cardiology

## 2011-11-02 ENCOUNTER — Other Ambulatory Visit: Payer: Self-pay | Admitting: Internal Medicine

## 2011-11-03 ENCOUNTER — Other Ambulatory Visit: Payer: Self-pay

## 2011-11-03 MED ORDER — GLUCOSE BLOOD VI STRP: ORAL_STRIP | Status: DC

## 2011-11-03 MED ORDER — ONETOUCH ULTRASOFT LANCETS MISC: Status: DC

## 2011-11-04 ENCOUNTER — Ambulatory Visit (INDEPENDENT_AMBULATORY_CARE_PROVIDER_SITE_OTHER): Payer: Medicare Other | Admitting: *Deleted

## 2011-11-04 DIAGNOSIS — I4891 Unspecified atrial fibrillation: Secondary | ICD-10-CM

## 2011-11-04 DIAGNOSIS — Z8679 Personal history of other diseases of the circulatory system: Secondary | ICD-10-CM

## 2011-11-04 DIAGNOSIS — Z7901 Long term (current) use of anticoagulants: Secondary | ICD-10-CM

## 2011-12-04 ENCOUNTER — Encounter: Payer: Self-pay | Admitting: Internal Medicine

## 2011-12-04 ENCOUNTER — Ambulatory Visit (INDEPENDENT_AMBULATORY_CARE_PROVIDER_SITE_OTHER): Payer: Medicare Other | Admitting: *Deleted

## 2011-12-04 ENCOUNTER — Ambulatory Visit (INDEPENDENT_AMBULATORY_CARE_PROVIDER_SITE_OTHER): Payer: Medicare Other | Admitting: Pharmacist

## 2011-12-04 DIAGNOSIS — Z8679 Personal history of other diseases of the circulatory system: Secondary | ICD-10-CM

## 2011-12-04 DIAGNOSIS — I472 Ventricular tachycardia: Secondary | ICD-10-CM

## 2011-12-04 DIAGNOSIS — I509 Heart failure, unspecified: Secondary | ICD-10-CM

## 2011-12-04 DIAGNOSIS — Z7901 Long term (current) use of anticoagulants: Secondary | ICD-10-CM

## 2011-12-04 DIAGNOSIS — I4891 Unspecified atrial fibrillation: Secondary | ICD-10-CM

## 2011-12-04 LAB — ICD DEVICE OBSERVATION
BATTERY VOLTAGE: 2.57 V
CHARGE TIME: 0 s
DEV-0020ICD: NEGATIVE
DEVICE MODEL ICD: 311254
RV LEAD THRESHOLD: 1 V
TOT-0006: 20130517000000
TOT-0008: 0
TZAT-0001FASTVT: 1
TZAT-0001SLOWVT: 1
TZAT-0004FASTVT: 8
TZAT-0012FASTVT: 200 ms
TZAT-0013SLOWVT: 4
TZAT-0018FASTVT: NEGATIVE
TZAT-0020SLOWVT: 1 ms
TZON-0003FASTVT: 280 ms
TZON-0005SLOWVT: 6
TZON-0008SLOWVT: 80 ms
TZON-0010FASTVT: 80 ms
TZON-0010SLOWVT: 80 ms
TZST-0001FASTVT: 3
TZST-0001FASTVT: 5
TZST-0001SLOWVT: 2
TZST-0001SLOWVT: 4
TZST-0003FASTVT: 25 J
TZST-0003FASTVT: 36 J
TZST-0003SLOWVT: 15 J
TZST-0003SLOWVT: 25 J
VENTRICULAR PACING ICD: 1 pct

## 2011-12-04 NOTE — Progress Notes (Signed)
ICD check

## 2011-12-06 ENCOUNTER — Emergency Department (HOSPITAL_COMMUNITY): Payer: Medicare Other

## 2011-12-06 ENCOUNTER — Encounter (HOSPITAL_COMMUNITY): Payer: Self-pay | Admitting: *Deleted

## 2011-12-06 ENCOUNTER — Other Ambulatory Visit: Payer: Self-pay

## 2011-12-06 ENCOUNTER — Emergency Department (HOSPITAL_COMMUNITY)
Admission: EM | Admit: 2011-12-06 | Discharge: 2011-12-06 | Disposition: A | Discharge status: Home / Self Care | Payer: Medicare Other | Attending: Emergency Medicine | Admitting: Emergency Medicine

## 2011-12-06 DIAGNOSIS — Z8673 Personal history of transient ischemic attack (TIA), and cerebral infarction without residual deficits: Secondary | ICD-10-CM | POA: Insufficient documentation

## 2011-12-06 DIAGNOSIS — G4733 Obstructive sleep apnea (adult) (pediatric): Secondary | ICD-10-CM | POA: Insufficient documentation

## 2011-12-06 DIAGNOSIS — Z794 Long term (current) use of insulin: Secondary | ICD-10-CM | POA: Insufficient documentation

## 2011-12-06 DIAGNOSIS — I509 Heart failure, unspecified: Secondary | ICD-10-CM | POA: Insufficient documentation

## 2011-12-06 DIAGNOSIS — Z79899 Other long term (current) drug therapy: Secondary | ICD-10-CM | POA: Insufficient documentation

## 2011-12-06 DIAGNOSIS — I10 Essential (primary) hypertension: Secondary | ICD-10-CM | POA: Insufficient documentation

## 2011-12-06 DIAGNOSIS — E119 Type 2 diabetes mellitus without complications: Secondary | ICD-10-CM | POA: Insufficient documentation

## 2011-12-06 DIAGNOSIS — E785 Hyperlipidemia, unspecified: Secondary | ICD-10-CM | POA: Insufficient documentation

## 2011-12-06 DIAGNOSIS — Z95810 Presence of automatic (implantable) cardiac defibrillator: Secondary | ICD-10-CM

## 2011-12-06 DIAGNOSIS — R42 Dizziness and giddiness: Secondary | ICD-10-CM | POA: Insufficient documentation

## 2011-12-06 LAB — CBC WITH DIFFERENTIAL/PLATELET
Basophils Relative: 0 % (ref 0–1)
Eosinophils Absolute: 0.1 10*3/uL (ref 0.0–0.7)
Eosinophils Relative: 2 % (ref 0–5)
HCT: 43.5 % (ref 39.0–52.0)
Hemoglobin: 14.4 g/dL (ref 13.0–17.0)
MCH: 31.4 pg (ref 26.0–34.0)
MCHC: 33.1 g/dL (ref 30.0–36.0)
MCV: 95 fL (ref 78.0–100.0)
Monocytes Absolute: 0.5 10*3/uL (ref 0.1–1.0)
Monocytes Relative: 11 % (ref 3–12)
Neutro Abs: 3 10*3/uL (ref 1.7–7.7)
RDW: 13.2 % (ref 11.5–15.5)

## 2011-12-06 LAB — URINALYSIS, ROUTINE W REFLEX MICROSCOPIC
Ketones, ur: NEGATIVE mg/dL
Leukocytes, UA: NEGATIVE
Nitrite: NEGATIVE
Specific Gravity, Urine: 1.019 (ref 1.005–1.030)
Urobilinogen, UA: 4 mg/dL — ABNORMAL HIGH (ref 0.0–1.0)

## 2011-12-06 LAB — CARDIAC PANEL(CRET KIN+CKTOT+MB+TROPI)
Relative Index: 1.4 (ref 0.0–2.5)
Total CK: 191 U/L (ref 7–232)

## 2011-12-06 LAB — BASIC METABOLIC PANEL
BUN: 11 mg/dL (ref 6–23)
CO2: 28 mEq/L (ref 19–32)
Chloride: 105 mEq/L (ref 96–112)
Creatinine, Ser: 0.99 mg/dL (ref 0.50–1.35)
Glucose, Bld: 113 mg/dL — ABNORMAL HIGH (ref 70–99)
Potassium: 3.5 mEq/L (ref 3.5–5.1)

## 2011-12-06 LAB — PRO B NATRIURETIC PEPTIDE: Pro B Natriuretic peptide (BNP): 850.4 pg/mL — ABNORMAL HIGH (ref 0–125)

## 2011-12-06 MED ORDER — SODIUM CHLORIDE 0.9 % IV SOLN: Freq: Once | INTRAVENOUS | Status: DC

## 2011-12-06 MED ORDER — SODIUM CHLORIDE 0.9 % IV SOLN
Freq: Once | INTRAVENOUS | Status: AC
  Administered 2011-12-06: 1000 mL via INTRAVENOUS

## 2011-12-06 MED ORDER — FAMOTIDINE IN NACL 20-0.9 MG/50ML-% IV SOLN: 20.0000 mg | INTRAVENOUS | Status: DC

## 2011-12-06 NOTE — ED Provider Notes (Signed)
History     CSN: [id number]  Arrival date & time 12/06/11  [id number]   First MD Initiated Contact with Patient 12/06/11 [phone]      Chief Complaint  Patient presents with  . Dizziness    (Consider location/radiation/quality/duration/timing/severity/associated sxs/prior treatment) HPI Comments: [name] 56 y.o. male   The chief complaint is: Patient presents with:   Dizziness   The patient has medical history significant for:   Past Medical History:   Acute gouty arthropathy                                      OSA (obstructive sleep apnea)                                Hyperlipidemia                                               CHF (congestive heart failure)                                 Comment: (EF 25%)   Cerebrovascular accident                                     Gout                                                         Hypokalemia                                                  HTN (hypertension)                                           DM (diabetes mellitus)                                       Lipoma                                                       Dyslipidemia                                                 Acute, but ill-defined, cerebrovascular disease              CAD (coronary artery  disease)                               The onset of the symptoms was  abrupt starting 4 days ago,  The Course is  persistent, gradually worsened Standing, walking makes symptoms worse, nothing makes symptoms better Has associated dizziness Denies fever, diaphoresis, syncope, sore throat, cough, congestion, chest pain, shortness of breath, abdominal pain, nausea, vomiting, diarrhea, numbness or tingling in his extremities, changes in vision, tinnitus.      The history is provided by the patient, the spouse and medical records. History Limited By: none.   [name] is a 55 y.o. male who presents to the emergency department c/o 4 days of intermittent dizziness  and lightheadedness since his AICD fired on Wednesday.  He was seen in the cardiology office on Friday for a Coumadin check and defibrillator interrogation. Interrogation showed NSVT resolved with a firing of the defibrillator.  He states the nurse evaluating him commented that he did not look well and they scheduled an appointment with the cardiologist for 12/22/2011. The office recommended that he rest for the weekend. He has followed those instructions but continues to feel dizzy and lightheaded since that time. This morning while cooking breakfast he became so dizzy that he needed to sit down to avoid falling.  He has tried taking Tylenol which helps him sleep but has not helped the dizziness. Rest makes it better but the symptoms returned when he gets up to walk. He denies fever, diaphoresis, syncope, sore throat, cough, congestion, chest pain, shortness of breath, abdominal pain, nausea, vomiting, diarrhea, numbness or tingling in his extremities, changes in vision, tinnitus.    Past Medical History  Diagnosis Date  . Acute gouty arthropathy   . OSA (obstructive sleep apnea)   . Hyperlipidemia   . CHF (congestive heart failure)      (EF 25%)  . Cerebrovascular accident   . Gout   . Hypokalemia   . HTN (hypertension)   . DM (diabetes mellitus)   . Lipoma   . Dyslipidemia   . Acute, but ill-defined, cerebrovascular disease   . CAD (coronary artery disease)     Past Surgical History  Procedure Date  . Cardiac catheterization     Nonobstructive coronary disease (catheterization in  2009 with left main calcified, LAD with luminal irregularities, circumflex with 60-70% PDA stenosis, the right coronary artery is nondominant with 60% stenosis)  . Cardiac defibrillator placement     ICD-St. Jude    History reviewed. No pertinent family history.  History  Substance Use Topics  . Smoking status: Former Smoker    Quit date: 05/04/1986  . Smokeless tobacco: Not on file   Comment: Quit  smoking 30 yrs ago. Smoked as teenager less than 1/2 ppd. smoked x 4 years.  . Alcohol Use: Yes      Review of Systems  Constitutional: Negative for fever, diaphoresis, appetite change, fatigue and unexpected weight change.  HENT: Negative for mouth sores, neck pain and neck stiffness.   Eyes: Negative for visual disturbance.  Respiratory: Negative for cough, chest tightness, shortness of breath and wheezing.   Cardiovascular: Negative for chest pain, palpitations and leg swelling.  Gastrointestinal: Negative for nausea, vomiting, abdominal pain, diarrhea and constipation.  Genitourinary: Negative for dysuria, urgency, frequency and hematuria.  Musculoskeletal: Negative for back pain.  Skin: Negative for rash.  Neurological: Positive for dizziness and light-headedness. Negative for tremors,  syncope, facial asymmetry, speech difficulty, weakness, numbness and headaches.  Hematological: Does not bruise/bleed easily.    Allergies  Review of patient's allergies indicates no known allergies.  Home Medications   Current Outpatient Rx  Name Route Sig Dispense Refill  . ACETAMINOPHEN 500 MG PO TABS Oral Take 500 mg by mouth every 6 (six) hours as needed. For pain    . ALLOPURINOL 300 MG PO TABS Oral Take 1 tablet (300 mg total) by mouth daily. 90 tablet 1  . AMLODIPINE BESYLATE 10 MG PO TABS Oral Take 1 tablet (10 mg total) by mouth daily. 30 tablet 7  . ATORVASTATIN CALCIUM 40 MG PO TABS Oral Take 1 tablet (40 mg total) by mouth daily. 90 tablet 1  . CARVEDILOL 25 MG PO TABS Oral Take 50 mg by mouth 2 (two) times daily.    . FUROSEMIDE 80 MG PO TABS Oral Take 80 mg by mouth 2 (two) times daily.    [name] HYDRALAZINE HCL 25 MG PO TABS Oral Take 25 mg by mouth 3 (three) times daily.    . INSULIN GLARGINE 100 UNIT/ML [place] SOLN Subcutaneous Inject 10 Units into the skin at bedtime.    . ISOSORBIDE DINITRATE 20 MG PO TABS Oral Take 20 mg by mouth 3 (three) times daily.    [name] METFORMIN HCL 500 MG PO  TABS Oral Take 500 mg by mouth 2 (two) times daily.    . MULTIVITAMINS PO CAPS Oral Take 1 capsule by mouth daily.     [name] POTASSIUM CHLORIDE CRYS ER 20 MEQ PO TBCR Oral Take 40 mEq by mouth 3 (three) times daily.    [name] RAMIPRIL 10 MG PO TABS Oral Take 10 mg by mouth 2 (two) times daily.    . WARFARIN SODIUM 5 MG PO TABS Oral Take 15 mg by mouth daily.    . BD PEN NEEDLE SHORT U/F 31G X 8 MM MISC  USE ONCE DAILY AS DIRECTED 100 each 3  . GLUCOSE BLOOD VI STRP  Test once daily 100 each 11  . ONETOUCH ULTRASOFT LANCETS MISC  TEST AS DIRECTED 100 each 11  . ONETOUCH ULTRASOFT LANCETS MISC  Test once daily as instructed. 100 each 11  . ONETOUCH TEST VI STRP  TEST AS DIRECTED 100 each 11    BP 110/79  Pulse 67  Temp 98.8 F (37.1 C) (Oral)  Resp 20  SpO2 100%  Physical Exam  Nursing note and vitals reviewed. Constitutional: He is oriented to person, place, and time. He appears well-developed and well-nourished. No distress.  HENT:  Head: Normocephalic and atraumatic.  Mouth/Throat: Oropharynx is clear and moist. No oropharyngeal exudate.  Eyes: Conjunctivae and EOM are normal. Pupils are equal, round, and reactive to light. No scleral icterus.  Neck: Normal range of motion. Neck supple.  Cardiovascular: Normal rate, normal heart sounds and intact distal pulses.  An irregular rhythm present.  No murmur heard. Pulses:      Carotid pulses are 2+ on the right side, and 2+ on the left side.      Radial pulses are 2+ on the right side, and 2+ on the left side.       Dorsalis pedis pulses are 2+ on the right side, and 2+ on the left side.       Posterior tibial pulses are 2+ on the right side, and 2+ on the left side.  Pulmonary/Chest: Effort normal and breath sounds normal. No accessory muscle usage. Not tachypneic. No respiratory distress.  He has no decreased breath sounds. He has no wheezes. He has no rhonchi. He has no rales.  Abdominal: Soft. Normal appearance and bowel sounds are normal. He  exhibits no mass. There is no tenderness. There is no rebound and no guarding.  Musculoskeletal: Normal range of motion. He exhibits no edema.  Lymphadenopathy:    He has no cervical adenopathy.  Neurological: He is alert and oriented to person, place, and time. He has normal reflexes. He exhibits normal muscle tone. Coordination normal.       Speech is clear and goal oriented, follows commands Cranial nerves III - XII without deficit, no facial droop Normal strength in upper and lower extremities bilaterally, strong and equal grip strength Sensation normal to light and sharp touch Moves extremities without ataxia, coordination intact Normal finger to nose and rapid alternating movements No pronator drift  Skin: Skin is warm. No rash noted. He is diaphoretic (intermittent diaphoresis assocaited with lightheaded spells).  Psychiatric: He has a normal mood and affect. His behavior is normal. Judgment and thought content normal.    ED Course  Procedures (including critical care time)  Labs Reviewed  BASIC METABOLIC PANEL - Abnormal; Notable for the following:    Glucose, Bld 113 (*)     All other components within normal limits  PROTIME-INR - Abnormal; Notable for the following:    Prothrombin Time 26.0 (*)     INR 2.34 (*)     All other components within normal limits  PRO B NATRIURETIC PEPTIDE - Abnormal; Notable for the following:    Pro B Natriuretic peptide (BNP) 850.4 (*)     All other components within normal limits  CBC WITH DIFFERENTIAL  CARDIAC PANEL(CRET KIN+CKTOT+MB+TROPI)  URINALYSIS, ROUTINE W REFLEX MICROSCOPIC   Dg Chest 2 View  12/06/2011  *RADIOLOGY REPORT*  Clinical Data: Recent defibrillator firing, lightheadedness  CHEST - 2 VIEW  Comparison: 01/21/2011  Findings: The defibrillator is again seen.  Cardiac shadow is stable.  The lungs are clear bilaterally.  The osseous structures are stable.  IMPRESSION: No acute abnormality is noted.  Original Report Authenticated  By: Everlene Farrier, M.D.     1. Dizziness - light-headed   2. Presence of automatic cardioverter/defibrillator (AICD)       MDM  [name] presents with persistent dizziness and lightheadedness x 4 days after firing of his AICD.  His neurological exam is without deficit and I think CVA is unlikely.  AICD interrogation showed NSVT.  I will evaluate for electrolyte abnormalities, cardiac disease and infections as a cause of arrythmia.  Cardiac panel is neg at this time and since symptoms have been present for 3 days, I am comfortable with 1 negative result.  BNP is slightly elevated, but the last lab comparison is from 2011, he has a hx of CHF and no evidence of fluid overload at this time.  His CBC and BMP are unremarkable.  INR is therapeutic.  On re-eval after fluid administration, pt states he feels much better.  He is no longer dizzy and is able to ambulate without difficulty.  He is comfortable with discharge.  I have discussed reasons for immediate return to the ED.  Patient and wife express understanding.    1. Medications: usual home medications 2. Treatment: rest, hydration as allowed 3. Follow Up: with cardiology as scheduled or return to the ER if symptoms return or AICD fires again.            Jarrett Soho Phebe Dettmer,  PA-C 12/06/11 1500

## 2011-12-06 NOTE — ED Notes (Signed)
Pt is aware of urine sample needed. Pt states he is unable to void at the moment, urinal at bedside

## 2011-12-06 NOTE — ED Provider Notes (Signed)
Medical screening examination/treatment/procedure(s) were conducted as a shared visit with non-physician practitioner(s) and myself.  I personally evaluated the patient during the encounter 56 yo man had AICD fire last week.  He feels weak.  Exam shows him to be in no distress, heart and lungs WNL.  Lab EKG and chest x-ray negative.  Interrogation of his AICD 2 days ago showed an episode of ventricular tachycardia terminated with a shock--appropriate response of AICD.  Reassured and released.  Mylinda Latina III, MD 12/06/11 1710

## 2011-12-06 NOTE — ED Notes (Signed)
Pt states he is still unable to void, pt requesting water to drink. Will notify EDP about water

## 2011-12-06 NOTE — ED Notes (Signed)
Patient transported to X-ray 

## 2011-12-06 NOTE — ED Notes (Signed)
Reports that his defib went off on wed, he went to pcp on Friday. Has been resting but still feeling lightheaded since the defib fired, denies any pain. ekg done at triage.

## 2011-12-09 ENCOUNTER — Observation Stay (HOSPITAL_COMMUNITY)
Admission: EM | Admit: 2011-12-09 | Discharge: 2011-12-10 | Disposition: A | Discharge status: Home / Self Care | Payer: Medicare Other | Attending: Cardiology | Admitting: Cardiology

## 2011-12-09 ENCOUNTER — Encounter (HOSPITAL_COMMUNITY): Payer: Self-pay | Admitting: Emergency Medicine

## 2011-12-09 DIAGNOSIS — R079 Chest pain, unspecified: Secondary | ICD-10-CM

## 2011-12-09 DIAGNOSIS — I2510 Atherosclerotic heart disease of native coronary artery without angina pectoris: Secondary | ICD-10-CM | POA: Insufficient documentation

## 2011-12-09 DIAGNOSIS — M109 Gout, unspecified: Secondary | ICD-10-CM | POA: Insufficient documentation

## 2011-12-09 DIAGNOSIS — G4733 Obstructive sleep apnea (adult) (pediatric): Secondary | ICD-10-CM | POA: Insufficient documentation

## 2011-12-09 DIAGNOSIS — I10 Essential (primary) hypertension: Secondary | ICD-10-CM | POA: Insufficient documentation

## 2011-12-09 DIAGNOSIS — E876 Hypokalemia: Secondary | ICD-10-CM | POA: Insufficient documentation

## 2011-12-09 DIAGNOSIS — Z8673 Personal history of transient ischemic attack (TIA), and cerebral infarction without residual deficits: Secondary | ICD-10-CM | POA: Insufficient documentation

## 2011-12-09 DIAGNOSIS — D179 Benign lipomatous neoplasm, unspecified: Secondary | ICD-10-CM | POA: Insufficient documentation

## 2011-12-09 DIAGNOSIS — Z95810 Presence of automatic (implantable) cardiac defibrillator: Secondary | ICD-10-CM | POA: Insufficient documentation

## 2011-12-09 DIAGNOSIS — I509 Heart failure, unspecified: Secondary | ICD-10-CM | POA: Insufficient documentation

## 2011-12-09 DIAGNOSIS — I5022 Chronic systolic (congestive) heart failure: Secondary | ICD-10-CM | POA: Insufficient documentation

## 2011-12-09 DIAGNOSIS — R42 Dizziness and giddiness: Secondary | ICD-10-CM | POA: Insufficient documentation

## 2011-12-09 DIAGNOSIS — I428 Other cardiomyopathies: Secondary | ICD-10-CM | POA: Insufficient documentation

## 2011-12-09 DIAGNOSIS — E119 Type 2 diabetes mellitus without complications: Secondary | ICD-10-CM | POA: Insufficient documentation

## 2011-12-09 HISTORY — DX: Other ventricular tachycardia: I47.29

## 2011-12-09 HISTORY — DX: Presence of automatic (implantable) cardiac defibrillator: Z95.810

## 2011-12-09 HISTORY — DX: Ventricular tachycardia: I47.2

## 2011-12-09 HISTORY — DX: Ventricular tachycardia, unspecified: I47.20

## 2011-12-09 LAB — URINALYSIS, ROUTINE W REFLEX MICROSCOPIC
Ketones, ur: NEGATIVE mg/dL
Leukocytes, UA: NEGATIVE
Nitrite: NEGATIVE
Specific Gravity, Urine: 1.015 (ref 1.005–1.030)
Urobilinogen, UA: 2 mg/dL — ABNORMAL HIGH (ref 0.0–1.0)
pH: 6.5 (ref 5.0–8.0)

## 2011-12-09 LAB — POCT I-STAT, CHEM 8
BUN: 7 mg/dL (ref 6–23)
Calcium, Ion: 1.26 mmol/L — ABNORMAL HIGH (ref 1.12–1.23)
Chloride: 109 mEq/L (ref 96–112)
Glucose, Bld: 100 mg/dL — ABNORMAL HIGH (ref 70–99)
Potassium: 4.1 mEq/L (ref 3.5–5.1)

## 2011-12-09 LAB — GLUCOSE, CAPILLARY: Glucose-Capillary: 88 mg/dL (ref 70–99)

## 2011-12-09 LAB — CBC WITH DIFFERENTIAL/PLATELET
Basophils Relative: 0 % (ref 0–1)
Eosinophils Absolute: 0.1 10*3/uL (ref 0.0–0.7)
MCH: 31.2 pg (ref 26.0–34.0)
MCHC: 32.8 g/dL (ref 30.0–36.0)
Neutrophils Relative %: 70 % (ref 43–77)
Platelets: 190 10*3/uL (ref 150–400)
RBC: 4.26 MIL/uL (ref 4.22–5.81)

## 2011-12-09 LAB — BASIC METABOLIC PANEL
CO2: 25 mEq/L (ref 19–32)
Calcium: 9.3 mg/dL (ref 8.4–10.5)
Creatinine, Ser: 0.92 mg/dL (ref 0.50–1.35)
GFR calc non Af Amer: >90 mL/min (ref >90)
Glucose, Bld: 100 mg/dL — ABNORMAL HIGH (ref 70–99)
Sodium: 140 mEq/L (ref 135–145)

## 2011-12-09 LAB — CARDIAC PANEL(CRET KIN+CKTOT+MB+TROPI)
CK, MB: 2.8 ng/mL (ref 0.3–4.0)
Relative Index: 1.3 (ref 0.0–2.5)
Relative Index: 1.8 (ref 0.0–2.5)
Total CK: 212 U/L (ref 7–232)
Total CK: 155 U/L (ref 7–232)
Troponin I: <0.3 ng/mL (ref <0.30)

## 2011-12-09 MED ORDER — WARFARIN SODIUM 7.5 MG PO TABS
15.0000 mg | ORAL_TABLET | Freq: Every day | ORAL | Status: DC
  Administered 2011-12-09: 15 mg via ORAL
  Filled 2011-12-09 (×2): qty 2

## 2011-12-09 MED ORDER — CARVEDILOL 25 MG PO TABS
50.0000 mg | ORAL_TABLET | Freq: Two times a day (BID) | ORAL | Status: DC
  Administered 2011-12-09 – 2011-12-10 (×2): 50 mg via ORAL
  Filled 2011-12-09 (×4): qty 2

## 2011-12-09 MED ORDER — ADULT MULTIVITAMIN W/MINERALS CH
1.0000 | ORAL_TABLET | Freq: Every day | ORAL | Status: DC
  Administered 2011-12-09 – 2011-12-10 (×2): 1 via ORAL
  Filled 2011-12-09 (×2): qty 1

## 2011-12-09 MED ORDER — ALLOPURINOL 300 MG PO TABS
300.0000 mg | ORAL_TABLET | Freq: Every day | ORAL | Status: DC
  Administered 2011-12-09 – 2011-12-10 (×2): 300 mg via ORAL
  Filled 2011-12-09 (×2): qty 1

## 2011-12-09 MED ORDER — NITROGLYCERIN 0.4 MG SL SUBL: 0.4000 mg | SUBLINGUAL_TABLET | SUBLINGUAL | Status: DC | PRN

## 2011-12-09 MED ORDER — FUROSEMIDE 80 MG PO TABS
80.0000 mg | ORAL_TABLET | Freq: Two times a day (BID) | ORAL | Status: DC
  Administered 2011-12-09 – 2011-12-10 (×3): 80 mg via ORAL
  Filled 2011-12-09 (×4): qty 1

## 2011-12-09 MED ORDER — ACETAMINOPHEN 325 MG PO TABS: 650.0000 mg | ORAL_TABLET | ORAL | Status: DC | PRN

## 2011-12-09 MED ORDER — RAMIPRIL 10 MG PO CAPS
10.0000 mg | ORAL_CAPSULE | Freq: Two times a day (BID) | ORAL | Status: DC
  Administered 2011-12-09 – 2011-12-10 (×2): 10 mg via ORAL
  Filled 2011-12-09 (×3): qty 1

## 2011-12-09 MED ORDER — ISOSORBIDE DINITRATE 20 MG PO TABS
20.0000 mg | ORAL_TABLET | Freq: Three times a day (TID) | ORAL | Status: DC
  Administered 2011-12-09 – 2011-12-10 (×3): 20 mg via ORAL
  Filled 2011-12-09 (×5): qty 1

## 2011-12-09 MED ORDER — WARFARIN - PHARMACIST DOSING INPATIENT: Freq: Every day | Status: DC

## 2011-12-09 MED ORDER — METFORMIN HCL 500 MG PO TABS
500.0000 mg | ORAL_TABLET | Freq: Two times a day (BID) | ORAL | Status: DC
  Administered 2011-12-09 – 2011-12-10 (×2): 500 mg via ORAL
  Filled 2011-12-09 (×4): qty 1

## 2011-12-09 MED ORDER — SODIUM CHLORIDE 0.9 % IJ SOLN: 3.0000 mL | INTRAMUSCULAR | Status: DC | PRN

## 2011-12-09 MED ORDER — SODIUM CHLORIDE 0.9 % IV SOLN: 250.0000 mL | INTRAVENOUS | Status: DC | PRN

## 2011-12-09 MED ORDER — MULTIVITAMINS PO CAPS: 1.0000 | ORAL_CAPSULE | Freq: Every day | ORAL | Status: DC

## 2011-12-09 MED ORDER — INSULIN GLARGINE 100 UNIT/ML ~~LOC~~ SOLN
10.0000 [IU] | Freq: Every day | SUBCUTANEOUS | Status: DC
  Administered 2011-12-09: 10 [IU] via SUBCUTANEOUS

## 2011-12-09 MED ORDER — HYDRALAZINE HCL 50 MG PO TABS
50.0000 mg | ORAL_TABLET | Freq: Three times a day (TID) | ORAL | Status: DC
  Administered 2011-12-09 – 2011-12-10 (×2): 50 mg via ORAL
  Filled 2011-12-09 (×5): qty 1

## 2011-12-09 MED ORDER — ONDANSETRON HCL 4 MG/2ML IJ SOLN: 4.0000 mg | Freq: Four times a day (QID) | INTRAMUSCULAR | Status: DC | PRN

## 2011-12-09 MED ORDER — INSULIN ASPART 100 UNIT/ML ~~LOC~~ SOLN: 0.0000 [IU] | Freq: Three times a day (TID) | SUBCUTANEOUS | Status: DC

## 2011-12-09 MED ORDER — ATORVASTATIN CALCIUM 40 MG PO TABS
40.0000 mg | ORAL_TABLET | Freq: Every day | ORAL | Status: DC
  Administered 2011-12-09 – 2011-12-10 (×2): 40 mg via ORAL
  Filled 2011-12-09 (×2): qty 1

## 2011-12-09 MED ORDER — AMLODIPINE BESYLATE 10 MG PO TABS
10.0000 mg | ORAL_TABLET | Freq: Every day | ORAL | Status: DC
  Administered 2011-12-09 – 2011-12-10 (×2): 10 mg via ORAL
  Filled 2011-12-09 (×2): qty 1

## 2011-12-09 MED ORDER — POTASSIUM CHLORIDE CRYS ER 20 MEQ PO TBCR
40.0000 meq | EXTENDED_RELEASE_TABLET | Freq: Three times a day (TID) | ORAL | Status: DC
  Administered 2011-12-09 – 2011-12-10 (×3): 40 meq via ORAL
  Filled 2011-12-09 (×5): qty 2

## 2011-12-09 MED ORDER — SODIUM CHLORIDE 0.9 % IJ SOLN
3.0000 mL | Freq: Two times a day (BID) | INTRAMUSCULAR | Status: DC
  Administered 2011-12-09 – 2011-12-10 (×3): 3 mL via INTRAVENOUS

## 2011-12-09 NOTE — H&P (Signed)
History and Physical  Patient ID: [name], DOB: [id number] Date of Encounter: 12/09/2011, 12:16 PM Primary Physician: Cathlean Cower, MD Primary Cardiologist: Dr. Percival Spanish, Dr. 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 Last ICD check was 12/04/11 demonstrating 1 NSVT episode in June and 1 VF treated successfully with 25j shock on July 31st. He was seen by the ER 12/06/11 for dizziness occurring frequently since his ICD discharge on 7/31. Per ER notes, neuro exam was normal, cardiac panel was negative, lyes were OK, he was euvolemic and given IVF with improvement of his dizziness. 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 However, over the last few days his intermittent dizziness has returned, can last for quite a while, worse when standing up and walking around, improved with lying down or resting. This morning he woke up and while making breakfast became acutely dizzy so took a break. He describes the dizziness like being on a boat. While lying on the couch, he began to develop mid-left-sided chest tightness described as a "pinching" sensation lasting about a minute associated with mild diaphoresis. He did not have SOB with this episode, but later had transient SOB after paramedics had been called. He denies any palpitations, near syncope, syncope, LEE, focal weakness, visual changes, aphasia. He has chronic unchanged orthopnea. No DOE. No CP on exertion but he "keeps his activity in check because I know my limits." Interrogation today reveals no VT/VF episodes between 8/2 and today.   In ER, I checked orthostatics which were positive due to [id number] drop in diastolic pressure:  Lying: 158/129 (no current dizziness) - HR  75 Sitting: 153/115 (no dizziness) - HR76 Standing 6min: 155/108 (+dizziness within 1 min of standing) - HR 77  Tele is showing NSR with frequent polymorphic PVCs, with one set of 4 polymorphic PVCs clustered together. CXR 12/06/11 was nonacute. Istat labs are unremarkable, CBC WNL, CE's neg x 2. INR was therapeutic on 8/4 at 2.34. He is not currently having any CP. EKG today is relatively unchanged from 01/2011; however EKG on 12/06/11 did reveal some subtle TWI inferiorly that is not present on today's tracing. He reports compliance with his medicines. Initially he endorsed watching his salt but divulged that he ate cheeseburger, french fries, & onion rings over the weekend.  Past Medical History  Diagnosis Date  . OSA (obstructive sleep apnea)   . CHF (congestive heart failure)     a. Likely NICM (out of proportion to CAD). b. 2009 - EF 25-30% by echo. c. s/p St. Jude ICD implantation 2007.  [name] Cerebrovascular accident     a. Basilar CVA 2000.  [name] Gout   . Hypokalemia   . HTN (hypertension)   . DM (diabetes mellitus)   . Lipoma   . Dyslipidemia   . Acute, but ill-defined, cerebrovascular disease   . CAD (coronary artery disease)     a. Nonobstructive by cath 2009.  [name] Atrial fibrillation     a. Noted 05/2008 by EKG.  . Paroxysmal VT     a. s/p St. Jude ICD 2007. b. H/o paroxysmal VT/VF.     Most Recent Cardiac Studies: [employer] Notes 12/04/11 Device interrogation ICD check in clinic. Normal device  function. Thresholds and sensing consistent with previous device measurements. Impedance trends stable over time. 1 NSVT episode in June and 1 VF treated successfully with 25j shock on July 31st. Histogram  distribution appropriate for patient and level of activity. No changes made this session. Device programmed at appropriate safety margins. Device programmed to optimize intrinsic conduction. Patient education completed including shock plan. Alert tones/vibration demonstrated for patient. Patient c/o  fatigue. He will see Dr. Percival Spanish 12/22/11 and Dr. Lovena Le in 3 months. The patient was also instructed to go to the ED via 911 for any shocks over this weekend.  Cardiac cath 01/2008 ANGIOGRAPHIC DATA:  1. Ventriculography in the RAO projection reveals a globally hypokinetic LV. The LV appears to be enlarged and overall ejection fraction to be estimated at 25-30%. There may be some mild mitral regurgitation, but difficult to assess because of the LV size. 2. The left main is calcified, but a very large-caliber vessel free of critical disease.  3. The LAD courses all the way to the apex, and supplies the distal inferior wall. There may be some mild irregularity of about 40% in the proximal diagonal. There is minimal luminal irregularity throughout the LAD but no critical stenosis.  4. The circumflex is a large dominant vessel. There is a marginal and posterolateral branch. The continuation branch leading into the PDA which is smaller in caliber has about 60-70% narrowing. 5. The right coronary artery is a nondominant vessel with about 60% mid-narrowing.  CONCLUSIONS:  1. Severe nonischemic cardiomyopathy with severe LV dysfunction out of proportion to the degree of coronary artery disease. 2. Mild scattered CAD.  DISPOSITION: I had spoken with Dr. Percival Spanish. He will see Dr. Percival Spanish back in followup in the clinic and continued medical management will be warranted.  2D Echo 12/2007 Summary  No LV thrombus noted. The left ventricle was moderately dilated. Overall left ventricular systolic function was moderately to markedly decreased. Left ventricular ejection fraction was estimated , range being 25 % to 30 %. Left ventricular wall thickness was mildly increased. LV function appears worsened compared to the prior study in 2007. The inferior and posterior walls appear severely hypokinetic to akinetic, with moderate hypokinesis of the anterior and lateral walls. E/medial E' = 20.4, suggestive of increased PCWP.  PADP = 20.9 cm based on PI jet doppler assuming RA pressure 5 mmHg. - There was mild aortic valvular regurgitation. - There was mild aortic root dilatation. - There was mild mitral valvular regurgitation. - The left atrium was mild to moderately dilated. - Right ventricular size was normal. Right ventricular systolic function was mildly reduced. There was the appearance of a catheter or pacing wire in the right ventricle. - Estimated peak pulmonary artery systolic pressure was 40 mmHg. - There was a small pericardial effusion posterior to the heart. IMPRESSIONS - The LV is moderately dilated with mild LVH and severely decreased systolic function, EF 123XX123. There is some regionality with the inferior and posterior walls appearing severely hypokinetic to akinetic. Compared to the prior study, LV function appears worse. - Elevated LA filling pressures.     Surgical History:  Past Surgical History  Procedure Date  . Cardiac catheterization     Nonobstructive coronary disease 2009  . Cardiac defibrillator placement     ICD-St. Jude  . Lipoma excision      Home Meds: Prior to Admission medications   Medication Sig Start Date End Date Taking? Authorizing Provider  allopurinol (ZYLOPRIM) 300 MG tablet Take 1 tablet (300  mg total) by mouth daily. 08/14/11  Yes Biagio Borg, MD  amLODipine (NORVASC) 10 MG tablet Take 1 tablet (10 mg total) by mouth daily. 04/15/11  Yes Minus Breeding, MD  atorvastatin (LIPITOR) 40 MG tablet Take 1 tablet (40 mg total) by mouth daily. 09/04/11  Yes Biagio Borg, MD  B-D ULTRAFINE III SHORT PEN 31G X 8 MM MISC USE ONCE DAILY AS DIRECTED 05/10/11  Yes Biagio Borg, MD  carvedilol (COREG) 25 MG tablet Take 50 mg by mouth 2 (two) times daily.   Yes Historical Provider, MD  furosemide (LASIX) 80 MG tablet Take 80 mg by mouth 2 (two) times daily.   Yes Historical Provider, MD  hydrALAZINE (APRESOLINE) 25 MG tablet Take 25 mg by mouth 3 (three) times daily.   Yes  Historical Provider, MD  insulin glargine (LANTUS SOLOSTAR) 100 UNIT/ML injection Inject 10 Units into the skin at bedtime.   Yes Historical Provider, MD  isosorbide dinitrate (ISORDIL) 20 MG tablet Take 20 mg by mouth 3 (three) times daily.   Yes Historical Provider, MD  Lancets [facility] ULTRASOFT) lancets TEST AS DIRECTED 11/02/11  Yes Biagio Borg, MD  metFORMIN (GLUCOPHAGE) 500 MG tablet Take 500 mg by mouth 2 (two) times daily. 08/19/11  Yes Biagio Borg, MD  Multiple Vitamin (MULTIVITAMIN) capsule Take 1 capsule by mouth daily.    Yes Historical Provider, MD  ONE TOUCH TEST STRIPS test strip TEST AS DIRECTED 11/02/11  Yes Biagio Borg, MD  potassium chloride SA (K-DUR,KLOR-CON) 20 MEQ tablet Take 40 mEq by mouth 3 (three) times daily.   Yes Historical Provider, MD  ramipril (ALTACE) 10 MG tablet Take 10 mg by mouth 2 (two) times daily.   Yes Historical Provider, MD  warfarin (COUMADIN) 5 MG tablet Take 15 mg by mouth daily.   Yes Historical Provider, MD    Allergies: No Known Allergies  History   Social History  . Marital Status: Married    Spouse Name: N/A    Number of Children: N/A  . Years of Education: N/A   Occupational History  . Not on file.   Social History Main Topics  . Smoking status: Former Smoker    Quit date: 05/04/1986  . Smokeless tobacco: Not on file   Comment: Quit smoking 30 yrs ago. Smoked as teenager less than 1/2 ppd. Smoked x 4 years.  . Alcohol Use: Yes  . Drug Use: Not on file  . Sexually Active: Not on file   Other Topics Concern  . Not on file   Social History Narrative  . No narrative on file     Family History  Problem Relation Age of Onset  . Coronary artery disease Mother     Review of Systems: General: negative for chills, fever, night sweats or weight changes.  Cardiovascular: see above Dermatological: negative for rash Respiratory: negative for cough or wheezing Urologic: negative for hematuria Abdominal: negative for nausea,  vomiting, diarrhea, bright red blood per rectum, melena, or hematemesis Neurologic: negative for visual changes, syncope, or dizziness All other systems reviewed and are otherwise negative except as noted above.  Labs:   Lab Results  Component Value Date   WBC 5.3 12/09/2011   HGB 14.3 12/09/2011   HCT 42.0 12/09/2011   MCV 95.1 12/09/2011   PLT 190 12/09/2011     Lab 12/09/11 1046  NA 140  K 3.8  CL 108  CO2 25  BUN 7  CREATININE 0.92  CALCIUM  9.3  PROT --  BILITOT --  ALKPHOS --  ALT --  AST --  GLUCOSE 100*    Basename 12/09/11 1046  CKTOTAL 212  CKMB 2.8  TROPONINI <0.30   Lab Results  Component Value Date   CHOL 153 09/08/2011   HDL 46.00 09/08/2011   LDLCALC 94 09/08/2011   TRIG 65.0 09/08/2011   POC troponin neg x 1 today  Radiology/Studies:  Dg Chest 2 View 12/06/2011  *RADIOLOGY REPORT*  Clinical Data: Recent defibrillator firing, lightheadedness  CHEST - 2 VIEW  Comparison: 01/21/2011  Findings: The defibrillator is again seen.  Cardiac shadow is stable.  The lungs are clear bilaterally.  The osseous structures are stable.  IMPRESSION: No acute abnormality is noted.  Original Report Authenticated By: Everlene Farrier, M.D.    EKG:  12/09/11: NSR 75bpm, occ PVCs, LVH, borderline 1st degree AVB, lateral TWI  AvL, V5-V6 (up to 49mm in V6), Q's I, avL, no acute ST elevation 12/06/11: EKG 12/06/11 demonstrated TWI inferiorly that is no longer present on today's tracing. That tracing did still show TWI V5-V6. 01/2011: Today's EKG appears similar to 01/2011.  Physical Exam: Blood pressure 131/95, pulse 68, temperature 97.9 F (36.6 C), temperature source Oral, resp. rate 20, SpO2 99.00%. General: Well developed obese AAM in no acute distress. Head: Normocephalic, atraumatic, sclera non-icteric, no xanthomas, nares are without discharge.  Neck: Negative for carotid bruits. JVD not elevated. Lungs: Clear bilaterally to auscultation without wheezes, rales, or rhonchi. Breathing is  unlabored. Heart: RRR with frequent ectopy with S1 S2. No murmurs, rubs, or gallops appreciated. Abdomen: Soft, non-tender, non-distended with normoactive bowel sounds. No hepatomegaly. No rebound/guarding. No obvious abdominal masses. Msk:  Strength 5/5 equally, tone appears normal for age. Extremities: No clubbing or cyanosis. No edema.  Distal pedal pulses are 2+ and equal bilaterally. Neuro: Alert and oriented X 3. Moves all extremities spontaneously. Nonfocal exam. Psych:  Responds to questions appropriately with a normal affect.   Orthostatics in ER: Lying: 158/129 (no current dizziness) - HR 75 Sitting: 153/115 (no dizziness) - HR76 Standing 69min: 155/108 (+dizziness within 1 min of standing) - HR 77  ASSESSMENT AND PLAN:   1. Dizziness - ddx includes exacerbation of HTN, frequent PVCs, diastolic orthostasis, ischemia, hypoglycemia, vs neuro cause. Admit, cycle enzymes, watch on telemetry. See below for comprehensive thoughts. 2. Chest pain with hx of nonobstructive CAD by cath in 2009 - atypical description, although he also reports recent fatigue that may represent anginal equivalent. Had mild-mod dz by cath 4 years ago. Check additional enzymes. Doubt PE given anticoagulation. Continue statin, BB. 3. H/o NICM/chronic systolic CHF, currently appears euvolemic - Continue ACEI, isordil, hydralazine, lasix, with adjustment as noted below. 4. HTN with orthostatic drop in diastolic pressure with standing, although baseline pressures are quite high. He may be improved symptomatically with improved blood pressure control. Will increase hydralazine and follow pressure. 5. Afib - maintaining NSR. Continue Coumadin. 6. H/o CVA - although neuro exam is nonfocal at present time, consider neuro consult for dizziness if measures initiated do not improve symptoms. 7. DM - continue present regimen but check CBGs ac&qhs to assess if any hypoglycemia is occurring with his home dosing.  Signed, Melina Copa PA-C 12/09/2011, 12:16 PM

## 2011-12-09 NOTE — ED Notes (Signed)
Pt brought in via EMS for CP that he describes as tightness. Pt has extensive cardiac hx, pt reports his AICD "went off" last Wednesday. Reported to EMS he was cooking breakfast became SOB, lightheaded and chest tightness began.

## 2011-12-09 NOTE — ED Notes (Signed)
Dr Lia Foyer here to see pt

## 2011-12-09 NOTE — ED Notes (Signed)
Heart Healthy Diet ordered spoke with Hilda Blades

## 2011-12-09 NOTE — Progress Notes (Signed)
ANTICOAGULATION CONSULT NOTE - Initial Consult  Pharmacy Consult for Coumadin Indication: atrial fibrillation  No Known Allergies  Patient Measurements:     Vital Signs: Temp: 98.1 F (36.7 C) (08/07 1500) Temp src: Oral (08/07 1500) BP: 125/89 mmHg (08/07 1439) Pulse Rate: 68  (08/07 1500)  Labs:  Basename 12/09/11 1346 12/09/11 1046 12/09/11 0905 12/09/11 0843  HGB -- -- 14.3 13.3  HCT -- -- 42.0 40.5  PLT -- -- -- 190  APTT -- -- -- --  LABPROT -- 27.9* -- --  INR -- 2.56* -- --  HEPARINUNFRC -- -- -- --  CREATININE -- 0.92 1.00 --  CKTOTAL 166 212 -- --  CKMB 2.8 2.8 -- --  TROPONINI <0.30 <0.30 -- --    The CrCl is unknown because both a height and weight (above a minimum accepted value) are required for this calculation.   Medical History: Past Medical History  Diagnosis Date  . OSA (obstructive sleep apnea)   . CHF (congestive heart failure)     a. Likely NICM (out of proportion to CAD). b. 2009 - EF 25-30% by echo. c. s/p St. Jude ICD implantation 2007.  [name] Cerebrovascular accident     a. Basilar CVA 2000.  [name] Gout   . Hypokalemia   . HTN (hypertension)   . DM (diabetes mellitus)   . Lipoma   . Dyslipidemia   . Acute, but ill-defined, cerebrovascular disease   . CAD (coronary artery disease)     a. Nonobstructive by cath 2009.  [name] Atrial fibrillation     a. Noted 05/2008 by EKG.  . Paroxysmal VT     a. s/p St. Jude ICD 2007. b. H/o paroxysmal VT/VF.    Medications:  Prescriptions prior to admission  Medication Sig Dispense Refill  . allopurinol (ZYLOPRIM) 300 MG tablet Take 1 tablet (300 mg total) by mouth daily.  90 tablet  1  . amLODipine (NORVASC) 10 MG tablet Take 1 tablet (10 mg total) by mouth daily.  30 tablet  7  . atorvastatin (LIPITOR) 40 MG tablet Take 1 tablet (40 mg total) by mouth daily.  90 tablet  1  . B-D ULTRAFINE III SHORT PEN 31G X 8 MM MISC USE ONCE DAILY AS DIRECTED  100 each  3  . carvedilol (COREG) 25 MG tablet Take 50 mg  by mouth 2 (two) times daily.      . furosemide (LASIX) 80 MG tablet Take 80 mg by mouth 2 (two) times daily.      . hydrALAZINE (APRESOLINE) 25 MG tablet Take 25 mg by mouth 3 (three) times daily.      . insulin glargine (LANTUS SOLOSTAR) 100 UNIT/ML injection Inject 10 Units into the skin at bedtime.      . isosorbide dinitrate (ISORDIL) 20 MG tablet Take 20 mg by mouth 3 (three) times daily.      . Lancets (ONETOUCH ULTRASOFT) lancets TEST AS DIRECTED  100 each  11  . metFORMIN (GLUCOPHAGE) 500 MG tablet Take 500 mg by mouth 2 (two) times daily.      . Multiple Vitamin (MULTIVITAMIN) capsule Take 1 capsule by mouth daily.       . ONE TOUCH TEST STRIPS test strip TEST AS DIRECTED  100 each  11  . potassium chloride SA (K-DUR,KLOR-CON) 20 MEQ tablet Take 40 mEq by mouth 3 (three) times daily.      . ramipril (ALTACE) 10 MG tablet Take 10 mg by mouth 2 (two) times daily.      [name]  warfarin (COUMADIN) 5 MG tablet Take 15 mg by mouth daily.        Assessment: 56 yo man admitted with dizziness and CP to continue coumadin for afib.  His INR is therapeutic on home dose of 15 mg daily. Goal of Therapy:  INR 2-3 Monitor platelets by anticoagulation protocol: Yes   Plan:  Continue home coumadin dose 15 mg daily. Check PT/INR daily. Monitor for S&S bleeding.  Excell Seltzer Poteet 12/09/2011,3:33 PM

## 2011-12-09 NOTE — ED Provider Notes (Signed)
History     CSN: [id number]  Arrival date & time 12/09/11  0802   First MD Initiated Contact with Patient 12/09/11 [phone]      Chief Complaint  Patient presents with  . Chest Pain    (Consider location/radiation/quality/duration/timing/severity/associated sxs/prior treatment) HPI Complains of lightheadedness onset this morning followed by chest tightness and left anterior chest, nonradiating which is mild accompanied by mild shortness of breath. No treatment prior to coming here. Last had chest discomfort approximately 6 months ago per patient. Seen here 12/06/2011 as his AICD fired. Presently discomfort is minimal. No other associated symptoms Past Medical History  Diagnosis Date  . Acute gouty arthropathy   . OSA (obstructive sleep apnea)   . Hyperlipidemia   . CHF (congestive heart failure)      (EF 25%)  . Cerebrovascular accident   . Gout   . Hypokalemia   . HTN (hypertension)   . DM (diabetes mellitus)   . Lipoma   . Dyslipidemia   . Acute, but ill-defined, cerebrovascular disease   . CAD (coronary artery disease)    Nonischemic cardiomyopathy Past Surgical History  Procedure Date  . Cardiac catheterization     Nonobstructive coronary disease (catheterization in  2009 with left main calcified, LAD with luminal irregularities, circumflex with 60-70% PDA stenosis, the right coronary artery is nondominant with 60% stenosis)  . Cardiac defibrillator placement     ICD-St. Jude    History reviewed. No pertinent family history.  History  Substance Use Topics  . Smoking status: Former Smoker    Quit date: 05/04/1986  . Smokeless tobacco: Not on file   Comment: Quit smoking 30 yrs ago. Smoked as teenager less than 1/2 ppd. smoked x 4 years.  . Alcohol Use: Yes      Review of Systems  Constitutional: Negative.   HENT: Negative.   Respiratory: Positive for shortness of breath.   Cardiovascular: Positive for chest pain.  Gastrointestinal: Negative.     Musculoskeletal: Negative.   Skin: Negative.   Neurological: Positive for light-headedness.  Hematological: Negative.   Psychiatric/Behavioral: Negative.   All other systems reviewed and are negative.    Allergies  Review of patient's allergies indicates no known allergies.  Home Medications   Current Outpatient Rx  Name Route Sig Dispense Refill  . ACETAMINOPHEN 500 MG PO TABS Oral Take 500 mg by mouth every 6 (six) hours as needed. For pain    . ALLOPURINOL 300 MG PO TABS Oral Take 1 tablet (300 mg total) by mouth daily. 90 tablet 1  . AMLODIPINE BESYLATE 10 MG PO TABS Oral Take 1 tablet (10 mg total) by mouth daily. 30 tablet 7  . ATORVASTATIN CALCIUM 40 MG PO TABS Oral Take 1 tablet (40 mg total) by mouth daily. 90 tablet 1  . BD PEN NEEDLE SHORT U/F 31G X 8 MM MISC  USE ONCE DAILY AS DIRECTED 100 each 3  . CARVEDILOL 25 MG PO TABS Oral Take 50 mg by mouth 2 (two) times daily.    . FUROSEMIDE 80 MG PO TABS Oral Take 80 mg by mouth 2 (two) times daily.    [name] GLUCOSE BLOOD VI STRP  Test once daily 100 each 11  . HYDRALAZINE HCL 25 MG PO TABS Oral Take 25 mg by mouth 3 (three) times daily.    . INSULIN GLARGINE 100 UNIT/ML [place] SOLN Subcutaneous Inject 10 Units into the skin at bedtime.    . ISOSORBIDE DINITRATE 20 MG PO TABS Oral Take  20 mg by mouth 3 (three) times daily.    Glory Rosebush ULTRASOFT LANCETS MISC  TEST AS DIRECTED 100 each 11  . ONETOUCH ULTRASOFT LANCETS MISC  Test once daily as instructed. 100 each 11  . METFORMIN HCL 500 MG PO TABS Oral Take 500 mg by mouth 2 (two) times daily.    . MULTIVITAMINS PO CAPS Oral Take 1 capsule by mouth daily.     Glory Rosebush TEST VI STRP  TEST AS DIRECTED 100 each 11  . POTASSIUM CHLORIDE CRYS ER 20 MEQ PO TBCR Oral Take 40 mEq by mouth 3 (three) times daily.    [name] RAMIPRIL 10 MG PO TABS Oral Take 10 mg by mouth 2 (two) times daily.    . WARFARIN SODIUM 5 MG PO TABS Oral Take 15 mg by mouth daily.      BP 130/94  Pulse 79  Temp 97.9  F (36.6 C) (Oral)  Resp 16  SpO2 100%  Physical Exam  Nursing note and vitals reviewed. Constitutional: He appears well-developed and well-nourished.  HENT:  Head: Normocephalic and atraumatic.  Eyes: Conjunctivae are normal. Pupils are equal, round, and reactive to light.  Neck: Neck supple. No tracheal deviation present. No thyromegaly present.  Cardiovascular: Normal rate and regular rhythm.   No murmur heard. Pulmonary/Chest: Effort normal and breath sounds normal.  Abdominal: Soft. Bowel sounds are normal. He exhibits no distension. There is no tenderness.       Obese  Musculoskeletal: Normal range of motion. He exhibits no edema and no tenderness.  Neurological: He is alert. Coordination normal.  Skin: Skin is warm and dry. No rash noted.  Psychiatric: He has a normal mood and affect.    ED Course  Procedures (including critical care time)  Date: 12/09/2011  Rate: 75  Rhythm: normal sinus rhythm  QRS Axis: normal  Intervals: normal  ST/T Wave abnormalities: nonspecific T wave changes  Conduction Disutrbances:none  Narrative Interpretation:   Old EKG Reviewed: unchanged  frequent PVCs, unchanged from 12/06/2011  Labs Reviewed  CBC WITH DIFFERENTIAL    No results found. Results for orders placed during the hospital encounter of 12/09/11  CBC WITH DIFFERENTIAL      Component Value Range   WBC 5.3  4.0 - 10.5 K/uL   RBC 4.26  4.22 - 5.81 MIL/uL   Hemoglobin 13.3  13.0 - 17.0 g/dL   HCT 40.5  39.0 - 52.0 %   MCV 95.1  78.0 - 100.0 fL   MCH 31.2  26.0 - 34.0 pg   MCHC 32.8  30.0 - 36.0 g/dL   RDW 13.0  11.5 - 15.5 %   Platelets 190  150 - 400 K/uL   Neutrophils Relative 70  43 - 77 %   Neutro Abs 3.7  1.7 - 7.7 K/uL   Lymphocytes Relative 20  12 - 46 %   Lymphs Abs 1.1  0.7 - 4.0 K/uL   Monocytes Relative 8  3 - 12 %   Monocytes Absolute 0.4  0.1 - 1.0 K/uL   Eosinophils Relative 2  0 - 5 %   Eosinophils Absolute 0.1  0.0 - 0.7 K/uL   Basophils Relative 0   0 - 1 %   Basophils Absolute 0.0  0.0 - 0.1 K/uL  POCT I-STAT TROPONIN I      Component Value Range   Troponin i, poc 0.01  0.00 - 0.08 ng/mL   Comment 3  POCT I-STAT, CHEM 8      Component Value Range   Sodium 144  135 - 145 mEq/L   Potassium 4.1  3.5 - 5.1 mEq/L   Chloride 109  96 - 112 mEq/L   BUN 7  6 - 23 mg/dL   Creatinine, Ser 1.00  0.50 - 1.35 mg/dL   Glucose, Bld 100 (*) 70 - 99 mg/dL   Calcium, Ion 1.26 (*) 1.12 - 1.23 mmol/L   TCO2 23  0 - 100 mmol/L   Hemoglobin 14.3  13.0 - 17.0 g/dL   HCT 42.0  39.0 - 52.0 %   Dg Chest 2 View  12/06/2011  *RADIOLOGY REPORT*  Clinical Data: Recent defibrillator firing, lightheadedness  CHEST - 2 VIEW  Comparison: 01/21/2011  Findings: The defibrillator is again seen.  Cardiac shadow is stable.  The lungs are clear bilaterally.  The osseous structures are stable.  IMPRESSION: No acute abnormality is noted.  Original Report Authenticated By: Everlene Farrier, M.D.     No diagnosis found.  Spoke with [facility] cardiology service will come to evaluate patient 10:30 AM patient asymptomatic. No distress MDM  Cardiology service to make disposition Diagnosis chest pain        Orlie Dakin, MD 12/09/11 1032

## 2011-12-09 NOTE — ED Notes (Signed)
Pt reports chest tightness this AM and dizziness while standing in the kitchen cooking. Patient states episode of chest pain lasted briefly, less than 1 minute and resolved with lying down. Pt denies pain at present. States he feels SOB and has expiratory wheezes in all lung fields.

## 2011-12-10 DIAGNOSIS — R079 Chest pain, unspecified: Principal | ICD-10-CM

## 2011-12-10 LAB — BASIC METABOLIC PANEL
CO2: 25 mEq/L (ref 19–32)
Calcium: 8.7 mg/dL (ref 8.4–10.5)
Creatinine, Ser: 0.93 mg/dL (ref 0.50–1.35)
Glucose, Bld: 98 mg/dL (ref 70–99)
Sodium: 139 mEq/L (ref 135–145)

## 2011-12-10 LAB — CBC
Hemoglobin: 13 g/dL (ref 13.0–17.0)
MCH: 30.7 pg (ref 26.0–34.0)
MCV: 93.9 fL (ref 78.0–100.0)
RBC: 4.23 MIL/uL (ref 4.22–5.81)

## 2011-12-10 LAB — CARDIAC PANEL(CRET KIN+CKTOT+MB+TROPI): Total CK: 154 U/L (ref 7–232)

## 2011-12-10 LAB — PROTIME-INR: INR: 2.71 — ABNORMAL HIGH (ref 0.00–1.49)

## 2011-12-10 LAB — GLUCOSE, CAPILLARY: Glucose-Capillary: 96 mg/dL (ref 70–99)

## 2011-12-10 MED ORDER — NITROGLYCERIN 0.4 MG SL SUBL: 0.4000 mg | SUBLINGUAL_TABLET | SUBLINGUAL | Status: DC | PRN

## 2011-12-10 MED ORDER — HYDRALAZINE HCL 50 MG PO TABS: 50.0000 mg | ORAL_TABLET | Freq: Three times a day (TID) | ORAL | Status: DC

## 2011-12-10 NOTE — Progress Notes (Signed)
SUBJECTIVE:  Up in room since admission without dizziness.  No pain.  No SOB   PHYSICAL EXAM Filed Vitals:   12/09/11 1500 12/09/11 1642 12/09/11 2100 12/10/11 0500  BP:  130/91 115/80 122/82  Pulse: 68 70 72 69  Temp: 98.1 F (36.7 C)  99 F (37.2 C) 98.6 F (37 C)  TempSrc: Oral  Oral Oral  Resp:   18 18  Weight:    263 lb 6.4 oz (119.477 kg)  SpO2:   97% 97%   General:  No distress Lungs:  Clear Heart:  RRR Abdomen:  Positive bowel sounds, no rebound no guarding Extremities:  No edema  LABS: Lab Results  Component Value Date   CKTOTAL 154 12/10/2011   CKMB 2.6 12/10/2011   TROPONINI <0.30 12/10/2011   Results for orders placed during the hospital encounter of 12/09/11 (from the past 24 hour(s))  CBC WITH DIFFERENTIAL     Status: Normal   Collection Time   12/09/11  8:43 AM      Component Value Range   WBC 5.3  4.0 - 10.5 K/uL   RBC 4.26  4.22 - 5.81 MIL/uL   Hemoglobin 13.3  13.0 - 17.0 g/dL   HCT 40.5  39.0 - 52.0 %   MCV 95.1  78.0 - 100.0 fL   MCH 31.2  26.0 - 34.0 pg   MCHC 32.8  30.0 - 36.0 g/dL   RDW 13.0  11.5 - 15.5 %   Platelets 190  150 - 400 K/uL   Neutrophils Relative 70  43 - 77 %   Neutro Abs 3.7  1.7 - 7.7 K/uL   Lymphocytes Relative 20  12 - 46 %   Lymphs Abs 1.1  0.7 - 4.0 K/uL   Monocytes Relative 8  3 - 12 %   Monocytes Absolute 0.4  0.1 - 1.0 K/uL   Eosinophils Relative 2  0 - 5 %   Eosinophils Absolute 0.1  0.0 - 0.7 K/uL   Basophils Relative 0  0 - 1 %   Basophils Absolute 0.0  0.0 - 0.1 K/uL  POCT I-STAT TROPONIN I     Status: Normal   Collection Time   12/09/11  8:52 AM      Component Value Range   Troponin i, poc 0.01  0.00 - 0.08 ng/mL   Comment 3           POCT I-STAT, CHEM 8     Status: Abnormal   Collection Time   12/09/11  9:05 AM      Component Value Range   Sodium 144  135 - 145 mEq/L   Potassium 4.1  3.5 - 5.1 mEq/L   Chloride 109  96 - 112 mEq/L   BUN 7  6 - 23 mg/dL   Creatinine, Ser 1.00  0.50 - 1.35 mg/dL   Glucose,  Bld 100 (*) 70 - 99 mg/dL   Calcium, Ion 1.26 (*) 1.12 - 1.23 mmol/L   TCO2 23  0 - 100 mmol/L   Hemoglobin 14.3  13.0 - 17.0 g/dL   HCT 42.0  39.0 - 52.0 %  CARDIAC PANEL(CRET KIN+CKTOT+MB+TROPI)     Status: Normal   Collection Time   12/09/11 10:46 AM      Component Value Range   Total CK 212  7 - 232 U/L   CK, MB 2.8  0.3 - 4.0 ng/mL   Troponin I <0.30  <0.30 ng/mL   Relative Index 1.3  0.0 -  2.5  PROTIME-INR     Status: Abnormal   Collection Time   12/09/11 10:46 AM      Component Value Range   Prothrombin Time 27.9 (*) 11.6 - 15.2 seconds   INR 2.56 (*) 0.00 - 99991111  BASIC METABOLIC PANEL     Status: Abnormal   Collection Time   12/09/11 10:46 AM      Component Value Range   Sodium 140  135 - 145 mEq/L   Potassium 3.8  3.5 - 5.1 mEq/L   Chloride 108  96 - 112 mEq/L   CO2 25  19 - 32 mEq/L   Glucose, Bld 100 (*) 70 - 99 mg/dL   BUN 7  6 - 23 mg/dL   Creatinine, Ser 0.92  0.50 - 1.35 mg/dL   Calcium 9.3  8.4 - 10.5 mg/dL   GFR calc non Af Amer >90  >90 mL/min   GFR calc Af Amer >90  >90 mL/min  MAGNESIUM     Status: Normal   Collection Time   12/09/11 10:46 AM      Component Value Range   Magnesium 2.0  1.5 - 2.5 mg/dL  URINALYSIS, ROUTINE W REFLEX MICROSCOPIC     Status: Abnormal   Collection Time   12/09/11 10:51 AM      Component Value Range   Color, Urine YELLOW  YELLOW   APPearance CLEAR  CLEAR   Specific Gravity, Urine 1.015  1.005 - 1.030   pH 6.5  5.0 - 8.0   Glucose, UA NEGATIVE  NEGATIVE mg/dL   Hgb urine dipstick NEGATIVE  NEGATIVE   Bilirubin Urine NEGATIVE  NEGATIVE   Ketones, ur NEGATIVE  NEGATIVE mg/dL   Protein, ur NEGATIVE  NEGATIVE mg/dL   Urobilinogen, UA 2.0 (*) 0.0 - 1.0 mg/dL   Nitrite NEGATIVE  NEGATIVE   Leukocytes, UA NEGATIVE  NEGATIVE  GLUCOSE, CAPILLARY     Status: Normal   Collection Time   12/09/11 12:15 PM      Component Value Range   Glucose-Capillary 88  70 - 99 mg/dL  CARDIAC PANEL(CRET KIN+CKTOT+MB+TROPI)     Status: Normal    Collection Time   12/09/11  1:46 PM      Component Value Range   Total CK 166  7 - 232 U/L   CK, MB 2.8  0.3 - 4.0 ng/mL   Troponin I <0.30  <0.30 ng/mL   Relative Index 1.7  0.0 - 2.5  GLUCOSE, CAPILLARY     Status: Normal   Collection Time   12/09/11  4:25 PM      Component Value Range   Glucose-Capillary 97  70 - 99 mg/dL   Comment 1 Notify RN    CARDIAC PANEL(CRET KIN+CKTOT+MB+TROPI)     Status: Normal   Collection Time   12/09/11  8:10 PM      Component Value Range   Total CK 155  7 - 232 U/L   CK, MB 2.8  0.3 - 4.0 ng/mL   Troponin I <0.30  <0.30 ng/mL   Relative Index 1.8  0.0 - 2.5  GLUCOSE, CAPILLARY     Status: Normal   Collection Time   12/09/11  8:33 PM      Component Value Range   Glucose-Capillary 83  70 - 99 mg/dL   Comment 1 Notify RN    CARDIAC PANEL(CRET KIN+CKTOT+MB+TROPI)     Status: Normal   Collection Time   12/10/11  1:58 AM  Component Value Range   Total CK 154  7 - 232 U/L   CK, MB 2.6  0.3 - 4.0 ng/mL   Troponin I <0.30  <0.30 ng/mL   Relative Index 1.7  0.0 - 2.5  CBC     Status: Normal   Collection Time   12/10/11  1:59 AM      Component Value Range   WBC 5.4  4.0 - 10.5 K/uL   RBC 4.23  4.22 - 5.81 MIL/uL   Hemoglobin 13.0  13.0 - 17.0 g/dL   HCT 39.7  39.0 - 52.0 %   MCV 93.9  78.0 - 100.0 fL   MCH 30.7  26.0 - 34.0 pg   MCHC 32.7  30.0 - 36.0 g/dL   RDW 12.7  11.5 - 15.5 %   Platelets 195  150 - 400 K/uL  BASIC METABOLIC PANEL     Status: Abnormal   Collection Time   12/10/11  1:59 AM      Component Value Range   Sodium 139  135 - 145 mEq/L   Potassium 3.4 (*) 3.5 - 5.1 mEq/L   Chloride 105  96 - 112 mEq/L   CO2 25  19 - 32 mEq/L   Glucose, Bld 98  70 - 99 mg/dL   BUN 9  6 - 23 mg/dL   Creatinine, Ser 0.93  0.50 - 1.35 mg/dL   Calcium 8.7  8.4 - 10.5 mg/dL   GFR calc non Af Amer >90  >90 mL/min   GFR calc Af Amer >90  >90 mL/min  PROTIME-INR     Status: Abnormal   Collection Time   12/10/11  1:59 AM      Component Value Range    Prothrombin Time 29.2 (*) 11.6 - 15.2 seconds   INR 2.71 (*) 0.00 - 1.49    Intake/Output Summary (Last 24 hours) at 12/10/11 [id number] Last data filed at 12/10/11 0500  Gross per 24 hour  Intake    120 ml  Output    750 ml  Net   -630 ml    TELEMETRY:  NSR, rare PVCs    ASSESSMENT AND PLAN:  DIZZINESS -  No clear etiology other than mild orthostasis.  His symptoms occur with standing.  I think he can go home with knee high compression stockings.  No further in patient work up.  CAD -  The patient has no new sypmtoms. No further cardiovascular testing is indicated. I don't think that current symptoms are related.    Congestive heart failure, unspecified -  He seems to be euvolemic.  At this point, no change in therapy is indicated.  We have reviewed salt and fluid restrictions.  No further cardiovascular testing is indicated.  HYPERTENSION - The blood pressure is at target. No change in medications is indicated. We will continue with therapeutic lifestyle changes (TLC).   HYPOKALEMIA - He is receiving 120 mg daily and I will follow up with repeat BMETs in the office.     Jeneen Rinks [facility] 12/10/2011 7:02 AM

## 2011-12-10 NOTE — Progress Notes (Signed)
RT Note: Pt refused to wear CPAP tonight.

## 2011-12-10 NOTE — Discharge Summary (Signed)
Discharge Summary   Patient ID: [name] MRN: [id number], DOB/AGE: 05-17-1955 56 y.o. Admit date: 12/09/2011 D/C date:     12/10/2011  Primary Cardiologist: Laury Deep (EP)  Primary Discharge Diagnoses:  1. Dizziness without clear etiology other than uncontrolled HTN with diastolic orthostasis - no arrhythmia noted, no further cardiovascular testing is indicated 2. CHF (congestive heart failure), euvolemic this admission - Likely NICM (out of proportion to CAD) - 2009 - EF 25-30% by echo - s/p St. Jude ICD implantation 2007 3. HTN 4. H/o paroxysmal VT/VF s/p St. Jude ICD 2007 - last ICD discharge for VF 12/02/11, no arrhythmias seen on interrogation  Secondary Discharge Diagnoses:  1. OSA (obstructive sleep apnea)   2. H/o Basilar CVA 2000.  3. Gout  4. Hypokalemia  5. DM 6. Lipoma 7. Dyslipidemia 8. Nonobstructive CAD by cath 2009 9. Prior h/o afib noted 05/2008 by EKG  Hospital Course: [name] is a 56 y/o M with hx of NICM/chronic systolic CHF with EF 123XX123, scattered nonobstructive CAD by cath in 2009, dyslipidemia, DM, remote afib who presented to [facility] with complaints of dizziness and chest pain. Last ICD check was 12/04/11 demonstrating 1 NSVT episode in June and 1 VF treated successfully with 25j shock on July 31st. He was seen by the ER 12/06/11 for dizziness occurring frequently since his ICD discharge on 7/31. Per ER notes, neuro exam was normal, cardiac panel was negative, lyes were OK, he was euvolemic and given IVF with improvement of his dizziness. 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 However, over the last few days his intermittent dizziness has returned, can last for quite a while, worse when standing up and walking around, improved with lying down or resting. This morning he woke up and while making breakfast became acutely dizzy so took a break. He describes the dizziness like being on a boat. While lying on the couch, he began to  develop mid-left-sided chest tightness described as a "pinching" sensation lasting about a minute associated with mild diaphoresis. He did not have SOB with this episode, but later had transient SOB after paramedics had been called. He denied any palpitations, near syncope, syncope, LEE, focal weakness, visual changes, aphasia. Interrogation in the ER no VT/VF episodes between 8/2 and today. He denied any exertional CP or dyspnea. His chest pain was felt atypical & CE's remained negative. He did have some evidence of diastolic orthostasis (BP 99991111 -> 155/108). Tele showed frequent PVCs but no sustained arrhythmias. Neurologic exam was completely nonfocal. INR was therapeutic. It was felt perhaps his dizziness was related to uncontrolled HTN and possibly diastolic orthostasis, although there was no clear-cut etiology. His hydralazine was increased with improved blood pressures. He was not hypoglycemic.  His dizziness improved today. Dr. Percival Spanish has seen and examined him today and feels he is stable for discharge. TED hose was placed. He will be instructed to see his primary doctor to evaluate for other causes of dizziness if it recurs.  Discharge Vitals: Blood pressure 118/80, pulse 69, temperature 98.6 F (37 C), temperature source Oral, resp. rate 18, weight 263 lb 6.4 oz (119.477 kg), SpO2 97.00%.  Labs: Lab Results  Component Value Date   WBC 5.4 12/10/2011   HGB 13.0 12/10/2011   HCT 39.7 12/10/2011   MCV 93.9 12/10/2011   PLT 195 12/10/2011     Lab 12/10/11 0159  NA 139  K 3.4*  CL 105  CO2 25  BUN 9  CREATININE 0.93  CALCIUM 8.7  PROT --  BILITOT --  ALKPHOS --  ALT --  AST --  GLUCOSE 98    Basename 12/10/11 0158 12/09/11 2010 12/09/11 1346 12/09/11 1046  CKTOTAL 154 155 166 212  CKMB 2.6 2.8 2.8 2.8  TROPONINI <0.30 <0.30 <0.30 <0.30   Lab Results  Component Value Date   CHOL 153 09/08/2011   HDL 46.00 09/08/2011   LDLCALC 94 09/08/2011   TRIG 65.0 09/08/2011    Diagnostic  Studies/Procedures   Chest 2 View 12/06/2011  *RADIOLOGY REPORT*  Clinical Data: Recent defibrillator firing, lightheadedness  CHEST - 2 VIEW  Comparison: 01/21/2011  Findings: The defibrillator is again seen.  Cardiac shadow is stable.  The lungs are clear bilaterally.  The osseous structures are stable.  IMPRESSION: No acute abnormality is noted.  Original Report Authenticated By: Everlene Farrier, M.D.    Discharge Medications   Medication List  As of 12/10/2011 11:22 AM   TAKE these medications         allopurinol 300 MG tablet   Commonly known as: ZYLOPRIM   Take 1 tablet (300 mg total) by mouth daily.      amLODipine 10 MG tablet   Commonly known as: NORVASC   Take 1 tablet (10 mg total) by mouth daily.      atorvastatin 40 MG tablet   Commonly known as: LIPITOR   Take 1 tablet (40 mg total) by mouth daily.      B-D ULTRAFINE III SHORT PEN 31G X 8 MM Misc   Generic drug: Insulin Pen Needle   USE ONCE DAILY AS DIRECTED      carvedilol 25 MG tablet   Commonly known as: COREG   Take 50 mg by mouth 2 (two) times daily.      furosemide 80 MG tablet   Commonly known as: LASIX   Take 80 mg by mouth 2 (two) times daily.      hydrALAZINE 50 MG tablet   Commonly known as: APRESOLINE   Take 1 tablet (50 mg total) by mouth every 8 (eight) hours.      isosorbide dinitrate 20 MG tablet   Commonly known as: ISORDIL   Take 20 mg by mouth 3 (three) times daily.      LANTUS SOLOSTAR 100 UNIT/ML injection   Generic drug: insulin glargine   Inject 10 Units into the skin at bedtime.      metFORMIN 500 MG tablet   Commonly known as: GLUCOPHAGE   Take 500 mg by mouth 2 (two) times daily.      multivitamin capsule   Take 1 capsule by mouth daily.      nitroGLYCERIN 0.4 MG SL tablet   Commonly known as: NITROSTAT   Place 1 tablet (0.4 mg total) under the tongue every 5 (five) minutes x 3 doses as needed for chest pain.      ONE TOUCH TEST STRIPS test strip   Generic drug: glucose  blood   TEST AS DIRECTED      onetouch ultrasoft lancets   TEST AS DIRECTED      potassium chloride SA 20 MEQ tablet   Commonly known as: K-DUR,KLOR-CON   Take 40 mEq by mouth 3 (three) times daily.      ramipril 10 MG tablet   Commonly known as: ALTACE   Take 10 mg by mouth 2 (two) times daily.      warfarin 5 MG tablet   Commonly  known as: COUMADIN   Take 15 mg by mouth daily.            Disposition   The patient will be discharged in stable condition to home. Discharge Orders    Future Appointments: Provider: Department: Dept Phone: Center:   12/22/2011 12:15 PM Minus Breeding, [place] [phone] [facility]   01/15/2012 9:45 AM [facility] [phone] None   03/18/2012 8:30 AM Biagio Borg, MD [facility] [phone] [facility]     Future Orders Please Complete By Expires   Diet - low sodium heart healthy      Increase activity slowly      Comments:   May return to work only if you feel well. Can try TED hose for your dizziness.     Follow-up Information    Follow up with Minus Breeding, MD. (12/22/11 at 12:15pm)    Contact information:   [address] [place] [phone]       Follow up with [facility]. (C[facility] Clinic 01/15/12 at 9:45am)    Contact information:   [place] [phone]      Follow up with Primary Care Doctor. (To evaluate for other causes of dizziness if it recurs)            Duration of Discharge Encounter: Greater than 30 minutes including physician and PA time.  Signed, Melina Copa PA-C 12/10/2011, 11:22 AM  Patient seen and examined.  Plan as discussed in my rounding note for today and outlined above. Jeneen Rinks [facility]  12/10/2011  11:33 AM

## 2011-12-10 NOTE — Progress Notes (Signed)
DC orders received.  Patient stable with no S/S of distress.  Patient counseled on donning TED hose, given TED hose and patient given HF packet and encouraged to weight self daily.  DC and medication instructions reviewed with patient and wife.  Patient DC home. Lina Sar

## 2011-12-10 NOTE — Progress Notes (Signed)
Patient c/o dizzy spell and feeling "swimmy headed" when changing positions from lying to sitting up in bed.  Patient's vital signs stable.  MD made aware.  Will apply TED hose.  Will continue to monitor. Lina Sar

## 2011-12-10 NOTE — Progress Notes (Signed)
ANTICOAGULATION CONSULT NOTE - Follow Up Consult  Pharmacy Consult for Coumadin Indication: atrial fibrillation  No Known Allergies  Patient Measurements: Weight: 263 lb 6.4 oz (119.477 kg) Heparin Dosing Weight:    Vital Signs: Temp: 98.6 F (37 C) (08/08 0500) Temp src: Oral (08/08 0500) BP: 118/80 mmHg (08/08 0917) Pulse Rate: 69  (08/08 0917)  Labs:  Basename 12/10/11 0159 12/10/11 0158 12/09/11 2010 12/09/11 1346 12/09/11 1046 12/09/11 0905 12/09/11 0843  HGB 13.0 -- -- -- -- 14.3 --  HCT 39.7 -- -- -- -- 42.0 40.5  PLT 195 -- -- -- -- -- 190  APTT -- -- -- -- -- -- --  LABPROT 29.2* -- -- -- 27.9* -- --  INR 2.71* -- -- -- 2.56* -- --  HEPARINUNFRC -- -- -- -- -- -- --  CREATININE 0.93 -- -- -- 0.92 1.00 --  CKTOTAL -- 154 155 166 -- -- --  CKMB -- 2.6 2.8 2.8 -- -- --  TROPONINI -- <0.30 <0.30 <0.30 -- -- --    The CrCl is unknown because both a height and weight (above a minimum accepted value) are required for this calculation.   Assessment: Dizziness and CP since ICD discharge 7/31.  Anticoag: Chronic Coumadin 15mg  daily with admit INR 2.56. INR today still therapeutic at 2.71. Continue home dose.  Cards: NICM/chronic systolic CHF, EF 123XX123, CAD, HLD, afib, ICD, HTN, Meds: Norvasc, Coreg, Lasix, hydralazine, Imdur, K, Altace  Endo: DM, gout, on allopurinol, SSI, lantus, metformin  Neuro: h/o CVA  Pulm: OSA  Goal of Therapy:  INR 2-3 Monitor platelets by anticoagulation protocol: Yes   Plan:  Continue Coumadin 15mg /day.  Alford Highland, [employer] 12/10/2011,9:58 AM

## 2011-12-10 NOTE — H&P (Signed)
Patient seen and examined.  He describes an episode of dizziness that is like something hitting him in the head, and then a wave like a ship.  He has had dietary indiscretion, and also has marked elevation in BP in the ER.  This is his second ER visit of the week.  His BP meds will need to be adjusted and he will be kept overnight for observation.

## 2011-12-22 ENCOUNTER — Encounter: Payer: Self-pay | Admitting: Cardiology

## 2011-12-22 ENCOUNTER — Ambulatory Visit (INDEPENDENT_AMBULATORY_CARE_PROVIDER_SITE_OTHER): Payer: Medicare Other | Admitting: Cardiology

## 2011-12-22 VITALS — BP 118/78 | HR 76 | Ht 72.0 in | Wt 263.0 lb

## 2011-12-22 DIAGNOSIS — I472 Ventricular tachycardia: Secondary | ICD-10-CM

## 2011-12-22 DIAGNOSIS — I509 Heart failure, unspecified: Secondary | ICD-10-CM

## 2011-12-22 DIAGNOSIS — R42 Dizziness and giddiness: Secondary | ICD-10-CM | POA: Insufficient documentation

## 2011-12-22 DIAGNOSIS — I10 Essential (primary) hypertension: Secondary | ICD-10-CM

## 2011-12-22 DIAGNOSIS — I4891 Unspecified atrial fibrillation: Secondary | ICD-10-CM

## 2011-12-22 DIAGNOSIS — I2510 Atherosclerotic heart disease of native coronary artery without angina pectoris: Secondary | ICD-10-CM

## 2011-12-22 NOTE — Progress Notes (Signed)
HPI This patient presents for followup after recent hospitalization. He was admitted with dizziness. ET of this was not clear though he was found to be hypertensive which was probably somewhat related. There was a diastolic orthostatic blood pressure drop. He was treated with increased dose of hydralazine as well as a suggestion for compression stockings.  Since being discharged he has worn his compression stockings and has felt better. He's had none of the acute dizziness that limited him. He's not had any presyncope or syncope. He did have one episode of chest discomfort and took a sublingual nitroglycerin. He's not been exercising but he within doing his activities of daily living without significant limitations.  He does have some chronic mild dyspnea with exertion but no acute exacerbation and no PND or orthopnea. He's had no weight gain or edema. Her  No Known Allergies  Current Outpatient Prescriptions  Medication Sig Dispense Refill  . allopurinol (ZYLOPRIM) 300 MG tablet Take 1 tablet (300 mg total) by mouth daily.  90 tablet  1  . amLODipine (NORVASC) 10 MG tablet Take 1 tablet (10 mg total) by mouth daily.  30 tablet  7  . atorvastatin (LIPITOR) 40 MG tablet Take 1 tablet (40 mg total) by mouth daily.  90 tablet  1  . carvedilol (COREG) 25 MG tablet Take 50 mg by mouth 2 (two) times daily.      . furosemide (LASIX) 80 MG tablet Take 80 mg by mouth 2 (two) times daily.      . hydrALAZINE (APRESOLINE) 50 MG tablet Take 1 tablet (50 mg total) by mouth every 8 (eight) hours.  90 tablet  6  . insulin glargine (LANTUS SOLOSTAR) 100 UNIT/ML injection Inject 10 Units into the skin at bedtime.      . isosorbide dinitrate (ISORDIL) 20 MG tablet Take 20 mg by mouth 3 (three) times daily.      . metFORMIN (GLUCOPHAGE) 500 MG tablet Take 500 mg by mouth 2 (two) times daily.      . Multiple Vitamin (MULTIVITAMIN) capsule Take 1 capsule by mouth daily.       . nitroGLYCERIN (NITROSTAT) 0.4 MG SL  tablet Place 1 tablet (0.4 mg total) under the tongue every 5 (five) minutes x 3 doses as needed for chest pain.  25 tablet  4  . potassium chloride SA (K-DUR,KLOR-CON) 20 MEQ tablet Take 40 mEq by mouth 3 (three) times daily.      . ramipril (ALTACE) 10 MG tablet Take 10 mg by mouth 2 (two) times daily.      [name] warfarin (COUMADIN) 5 MG tablet Take 15 mg by mouth daily.      . B-D ULTRAFINE III SHORT PEN 31G X 8 MM MISC USE ONCE DAILY AS DIRECTED  100 each  3  . Lancets (ONETOUCH ULTRASOFT) lancets TEST AS DIRECTED  100 each  11  . ONE TOUCH TEST STRIPS test strip TEST AS DIRECTED  100 each  11    Past Medical History  Diagnosis Date  . CHF (congestive heart failure)     a. Likely NICM (out of proportion to CAD). b. 2009 - EF 25-30% by echo. c. s/p St. Jude ICD implantation 2007.  [name] Cerebrovascular accident     a. Basilar CVA 2000.  [name] Gout   . Hypokalemia   . HTN (hypertension)   . Lipoma   . Dyslipidemia   . Acute, but ill-defined, cerebrovascular disease   . CAD (coronary artery disease)  a. Nonobstructive by cath 2009.  [name] Atrial fibrillation     a. Noted 05/2008 by EKG.  . Paroxysmal VT     a. s/p St. Jude ICD 2007. b. H/o paroxysmal VT/VF.  [name] Cardiomyopathy   . Anginal pain   . ICD (implantable cardiac defibrillator) in place   . Shortness of breath     only with CHF  . OSA (obstructive sleep apnea)     USES cpap  . DM (diabetes mellitus)     insulin dependent    Past Surgical History  Procedure Date  . Cardiac catheterization     Nonobstructive coronary disease 2009  . Cardiac defibrillator placement     ICD-St. Jude  . Lipoma excision    ROS:  As stated in the HPI and negative for all other systems.  PHYSICAL EXAM BP 118/78  Pulse 76  Ht 6' (1.829 m)  Wt 263 lb (119.296 kg)  BMI 35.67 kg/m2 GENERAL:  Well appearing HEENT:  Pupils equal round and reactive, fundi not visualized, oral mucosa unremarkable NECK:  No jugular venous distention, waveform within  normal limits, carotid upstroke brisk and symmetric, no bruits, no thyromegaly LYMPHATICS:  No cervical, inguinal adenopathy LUNGS:  Clear to auscultation bilaterally BACK:  No CVA tenderness CHEST:  Unremarkable HEART:  PMI not displaced or sustained,S1 and S2 within normal limits, no S3, no S4, no clicks, no rubs, no murmurs ABD:  Flat, positive bowel sounds normal in frequency in pitch, no bruits, no rebound, no guarding, no midline pulsatile mass, no hepatomegaly, no splenomegaly EXT:  2 plus pulses throughout, no edema, no cyanosis no clubbing SKIN:  No rashes no nodules NEURO:  Cranial nerves II through XII grossly intact, motor grossly intact throughout PSYCH:  Cognitively intact, oriented to person place and time   ASSESSMENT AND PLAN  Dizziness  -  This is improved with a change in his medications and compression stockings. No change in therapy is indicated. No further evaluation is warranted. He will continue the compression stockings.  CAD - Minus Breeding,  The patient has no new sypmtoms. No further cardiovascular testing is indicated. We will continue with aggressive risk reduction and meds as listed.   Congestive heart failure, unspecified -  He is on good meds. He seems to be euvolemic.  It has been several years since a reevaluated his ejection fraction and I will repeat an echocardiogram.  HYPERTENSION -  His blood pressure is better controlled.  He will continue the medications as ordered.  Obesity -  The patient understands the need to lose weight with diet and exercise. We have discussed specific strategies for this.

## 2011-12-22 NOTE — Patient Instructions (Addendum)
Continue current medications as listed.  Your physician has requested that you have an echocardiogram. Echocardiography is a painless test that uses sound waves to create images of your heart. It provides your doctor with information about the size and shape of your heart and how well your heart's chambers and valves are working. This procedure takes approximately one hour. There are no restrictions for this procedure.  Follow up in 6 months with Dr Percival Spanish.  You will receive a letter in the mail 2 months before you are due.  Please call us when you receive this letter to schedule your follow up appointment.

## 2011-12-31 ENCOUNTER — Ambulatory Visit (HOSPITAL_COMMUNITY): Payer: Medicare Other | Attending: Cardiovascular Disease

## 2011-12-31 DIAGNOSIS — I2510 Atherosclerotic heart disease of native coronary artery without angina pectoris: Secondary | ICD-10-CM | POA: Insufficient documentation

## 2011-12-31 DIAGNOSIS — I379 Nonrheumatic pulmonary valve disorder, unspecified: Secondary | ICD-10-CM | POA: Insufficient documentation

## 2011-12-31 DIAGNOSIS — I079 Rheumatic tricuspid valve disease, unspecified: Secondary | ICD-10-CM | POA: Insufficient documentation

## 2011-12-31 DIAGNOSIS — E119 Type 2 diabetes mellitus without complications: Secondary | ICD-10-CM | POA: Insufficient documentation

## 2011-12-31 DIAGNOSIS — I080 Rheumatic disorders of both mitral and aortic valves: Secondary | ICD-10-CM | POA: Insufficient documentation

## 2011-12-31 DIAGNOSIS — I4891 Unspecified atrial fibrillation: Secondary | ICD-10-CM | POA: Insufficient documentation

## 2011-12-31 DIAGNOSIS — I319 Disease of pericardium, unspecified: Secondary | ICD-10-CM | POA: Insufficient documentation

## 2011-12-31 DIAGNOSIS — I509 Heart failure, unspecified: Secondary | ICD-10-CM | POA: Insufficient documentation

## 2011-12-31 DIAGNOSIS — I10 Essential (primary) hypertension: Secondary | ICD-10-CM | POA: Insufficient documentation

## 2011-12-31 NOTE — Progress Notes (Signed)
Echocardiogram performed.  

## 2012-01-18 ENCOUNTER — Ambulatory Visit (INDEPENDENT_AMBULATORY_CARE_PROVIDER_SITE_OTHER): Payer: Medicare Other | Admitting: Pharmacist

## 2012-01-18 DIAGNOSIS — Z7901 Long term (current) use of anticoagulants: Secondary | ICD-10-CM

## 2012-01-18 DIAGNOSIS — I4891 Unspecified atrial fibrillation: Secondary | ICD-10-CM

## 2012-01-18 DIAGNOSIS — Z8679 Personal history of other diseases of the circulatory system: Secondary | ICD-10-CM

## 2012-01-18 LAB — POCT INR: INR: 2.8

## 2012-01-19 ENCOUNTER — Ambulatory Visit: Payer: Medicare Other | Admitting: Cardiology

## 2012-01-26 ENCOUNTER — Other Ambulatory Visit: Payer: Self-pay | Admitting: Internal Medicine

## 2012-01-26 ENCOUNTER — Other Ambulatory Visit: Payer: Self-pay | Admitting: Cardiology

## 2012-01-27 ENCOUNTER — Other Ambulatory Visit: Payer: Self-pay | Admitting: *Deleted

## 2012-01-27 MED ORDER — WARFARIN SODIUM 5 MG PO TABS: 5.0000 mg | ORAL_TABLET | ORAL | Status: DC

## 2012-01-27 NOTE — Telephone Encounter (Signed)
..   Requested Prescriptions   Pending Prescriptions Disp Refills  . amLODipine (NORVASC) 10 MG tablet [Pharmacy Med Name: AMLODIPINE BESYLATE 10MG  TABLETS] 30 tablet 6    Sig: TAKE 1 TABLET BY MOUTH DAILY  . carvedilol (COREG) 25 MG tablet [Pharmacy Med Name: CARVEDILOL 25MG  TABLETS] 120 tablet 6    Sig: TAKE 2 TABLETS BY MOUTH TWICE DAILY  . warfarin (COUMADIN) 5 MG tablet [Pharmacy Med Name: WARFARIN SOD 5MG  TABLETS (PEACH)] 270 tablet 0    Sig: TAKE AS DIRECTED  . ramipril (ALTACE) 10 MG capsule [Pharmacy Med Name: RAMIPRIL 10MG  CAPSULES] 60 capsule 6    Sig: TAKE ONE CAPSULE BY MOUTH TWICE DAILY

## 2012-01-27 NOTE — Telephone Encounter (Signed)
Please refill.

## 2012-03-04 ENCOUNTER — Ambulatory Visit (INDEPENDENT_AMBULATORY_CARE_PROVIDER_SITE_OTHER): Payer: Medicare Other | Admitting: Pharmacist

## 2012-03-04 DIAGNOSIS — Z7901 Long term (current) use of anticoagulants: Secondary | ICD-10-CM

## 2012-03-04 DIAGNOSIS — I4891 Unspecified atrial fibrillation: Secondary | ICD-10-CM

## 2012-03-04 DIAGNOSIS — Z8679 Personal history of other diseases of the circulatory system: Secondary | ICD-10-CM

## 2012-03-08 ENCOUNTER — Other Ambulatory Visit: Payer: Self-pay | Admitting: Cardiology

## 2012-03-08 NOTE — Telephone Encounter (Signed)
Pt wants meds changed to 90 days for all heart meds, uses [facility] [address], pls call [phone]

## 2012-03-10 MED ORDER — AMLODIPINE BESYLATE 10 MG PO TABS: 10.0000 mg | ORAL_TABLET | Freq: Every day | ORAL | Status: DC

## 2012-03-10 MED ORDER — POTASSIUM CHLORIDE CRYS ER 20 MEQ PO TBCR: 40.0000 meq | EXTENDED_RELEASE_TABLET | Freq: Three times a day (TID) | ORAL | Status: DC

## 2012-03-10 MED ORDER — FUROSEMIDE 80 MG PO TABS: 80.0000 mg | ORAL_TABLET | Freq: Two times a day (BID) | ORAL | Status: DC

## 2012-03-10 MED ORDER — CARVEDILOL 25 MG PO TABS: 50.0000 mg | ORAL_TABLET | Freq: Two times a day (BID) | ORAL | Status: DC

## 2012-03-10 MED ORDER — ATORVASTATIN CALCIUM 40 MG PO TABS: 40.0000 mg | ORAL_TABLET | Freq: Every day | ORAL | Status: DC

## 2012-03-10 MED ORDER — RAMIPRIL 10 MG PO CAPS: 10.0000 mg | ORAL_CAPSULE | Freq: Every day | ORAL | Status: DC

## 2012-03-18 ENCOUNTER — Telehealth: Payer: Self-pay

## 2012-03-18 ENCOUNTER — Telehealth: Payer: Self-pay | Admitting: General Practice

## 2012-03-18 ENCOUNTER — Other Ambulatory Visit (INDEPENDENT_AMBULATORY_CARE_PROVIDER_SITE_OTHER): Payer: Medicare Other

## 2012-03-18 ENCOUNTER — Encounter: Payer: Self-pay | Admitting: Internal Medicine

## 2012-03-18 ENCOUNTER — Ambulatory Visit (INDEPENDENT_AMBULATORY_CARE_PROVIDER_SITE_OTHER): Payer: Medicare Other | Admitting: Internal Medicine

## 2012-03-18 VITALS — BP 108/72 | HR 63 | Temp 97.7°F | Ht 72.0 in | Wt 259.0 lb

## 2012-03-18 DIAGNOSIS — Z7901 Long term (current) use of anticoagulants: Secondary | ICD-10-CM

## 2012-03-18 DIAGNOSIS — E119 Type 2 diabetes mellitus without complications: Secondary | ICD-10-CM

## 2012-03-18 DIAGNOSIS — Z125 Encounter for screening for malignant neoplasm of prostate: Secondary | ICD-10-CM

## 2012-03-18 DIAGNOSIS — H9192 Unspecified hearing loss, left ear: Secondary | ICD-10-CM | POA: Insufficient documentation

## 2012-03-18 DIAGNOSIS — H9190 Unspecified hearing loss, unspecified ear: Secondary | ICD-10-CM

## 2012-03-18 DIAGNOSIS — Z0000 Encounter for general adult medical examination without abnormal findings: Secondary | ICD-10-CM

## 2012-03-18 DIAGNOSIS — Z79899 Other long term (current) drug therapy: Secondary | ICD-10-CM

## 2012-03-18 LAB — HEMOGLOBIN A1C: Hgb A1c MFr Bld: 5.4 % (ref 4.6–6.5)

## 2012-03-18 LAB — BASIC METABOLIC PANEL
BUN: 13 mg/dL (ref 6–23)
Creatinine, Ser: 1 mg/dL (ref 0.4–1.5)
GFR: 98.31 mL/min (ref >60.00)

## 2012-03-18 LAB — URINALYSIS, ROUTINE W REFLEX MICROSCOPIC
Leukocytes, UA: NEGATIVE
Nitrite: NEGATIVE
Specific Gravity, Urine: 1.01 (ref 1.000–1.030)
Total Protein, Urine: NEGATIVE
pH: 6.5 (ref 5.0–8.0)

## 2012-03-18 LAB — CBC WITH DIFFERENTIAL/PLATELET
Basophils Absolute: 0 10*3/uL (ref 0.0–0.1)
Basophils Relative: 0.4 % (ref 0.0–3.0)
Eosinophils Absolute: 0.1 10*3/uL (ref 0.0–0.7)
Hemoglobin: 12.9 g/dL — ABNORMAL LOW (ref 13.0–17.0)
MCHC: 31.8 g/dL (ref 30.0–36.0)
MCV: 97.8 fl (ref 78.0–100.0)
Monocytes Absolute: 0.6 10*3/uL (ref 0.1–1.0)
Neutro Abs: 4 10*3/uL (ref 1.4–7.7)
Neutrophils Relative %: 66.5 % (ref 43.0–77.0)
RBC: 4.14 Mil/uL — ABNORMAL LOW (ref 4.22–5.81)
RDW: 13.8 % (ref 11.5–14.6)

## 2012-03-18 LAB — HEPATIC FUNCTION PANEL
Bilirubin, Direct: 0.2 mg/dL (ref 0.0–0.3)
Total Bilirubin: 1 mg/dL (ref 0.3–1.2)

## 2012-03-18 LAB — TSH: TSH: 1.6 u[IU]/mL (ref 0.35–5.50)

## 2012-03-18 LAB — LIPID PANEL: VLDL: 13.2 mg/dL (ref 0.0–40.0)

## 2012-03-18 LAB — PSA: PSA: 0.5 ng/mL (ref 0.10–4.00)

## 2012-03-18 NOTE — Progress Notes (Signed)
Subjective:    Patient ID: [name], male    DOB: December 18, 1955, 56 y.o.   MRN: [id number]  HPI  Here for wellness and f/u;  Overall doing ok;  Pt denies CP, worsening SOB, DOE, wheezing, orthopnea, PND, worsening LE edema, palpitations, dizziness or syncope.  Pt denies neurological change such as new Headache, facial or extremity weakness.  Pt denies polydipsia, polyuria, or low sugar symptoms. Pt states overall good compliance with treatment and medications, good tolerability, and trying to follow lower cholesterol diet.  Pt denies worsening depressive symptoms, suicidal ideation or panic. No fever, wt loss, night sweats, loss of appetite, or other constitutional symptoms.  Pt states good ability with ADL's, low fall risk, home safety reviewed and adequate, no significant changes in hearing or vision, and occasionally active with exercise. Last ICD shock was late July,  Followed closely per [name]/card.  Asks for coumadin clinic change due to cost.  Needs compression stocking prescription.  Has left ear hearing loss for over a wk - ? Wax impaction.  No overt bleeding or bruising.  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 CHF (congestive heart failure)     a. Likely NICM (out of proportion to CAD). b. 2009 - EF 25-30% by echo. c. s/p St. Jude ICD implantation 2007.  [name] Cerebrovascular accident     a. Basilar CVA 2000.  [name] Gout   . Hypokalemia   . HTN (hypertension)   . Lipoma   . Dyslipidemia   . Acute, but ill-defined, cerebrovascular disease   . CAD (coronary artery disease)     a. Nonobstructive by cath 2009.  [name] Atrial fibrillation     a. Noted 05/2008 by EKG.  . Paroxysmal VT     a. s/p St. Jude ICD 2007. b. H/o paroxysmal VT/VF.  [name] Cardiomyopathy   . Anginal pain   . ICD (implantable cardiac defibrillator) in place   . Shortness of breath     only with CHF  . OSA (obstructive sleep apnea)     USES cpap  . DM  (diabetes mellitus)     insulin dependent   Past Surgical History  Procedure Date  . Cardiac catheterization     Nonobstructive coronary disease 2009  . Cardiac defibrillator placement     ICD-St. Jude  . Lipoma excision     reports that he quit smoking about 25 years ago. He has never used smokeless tobacco. He reports that he drinks alcohol. He reports that he does not use illicit drugs. family history includes Coronary artery disease in his mother. No Known Allergies Current Outpatient Prescriptions on File Prior to Visit  Medication Sig Dispense Refill  . allopurinol (ZYLOPRIM) 300 MG tablet TAKE 1 TABLET BY MOUTH DAILY  90 tablet  2  . amLODipine (NORVASC) 10 MG tablet Take 1 tablet (10 mg total) by mouth daily.  90 tablet  2  . atorvastatin (LIPITOR) 40 MG tablet Take 1 tablet (40 mg total) by mouth daily.  90 tablet  2  . B-D ULTRAFINE III SHORT PEN 31G X 8 MM MISC USE ONCE DAILY AS DIRECTED  100 each  3  . carvedilol (COREG) 25 MG tablet Take 2 tablets (50 mg total) by mouth 2 (two) times daily with a meal.  360 tablet  2  . furosemide (LASIX) 80 MG tablet Take 1 tablet (80 mg total) by mouth 2 (two) times daily.  180 tablet  2  . hydrALAZINE (APRESOLINE) 50 MG tablet Take 1 tablet (50 mg total) by mouth every 8 (eight) hours.  90 tablet  6  . insulin glargine (LANTUS SOLOSTAR) 100 UNIT/ML injection Inject 10 Units into the skin at bedtime.      . isosorbide dinitrate (ISORDIL) 20 MG tablet Take 20 mg by mouth 3 (three) times daily.      . Lancets (ONETOUCH ULTRASOFT) lancets TEST AS DIRECTED  100 each  11  . metFORMIN (GLUCOPHAGE) 500 MG tablet Take 500 mg by mouth 2 (two) times daily.      . Multiple Vitamin (MULTIVITAMIN) capsule Take 1 capsule by mouth daily.       . nitroGLYCERIN (NITROSTAT) 0.4 MG SL tablet Place 1 tablet (0.4 mg total) under the tongue every 5 (five) minutes x 3 doses as needed for chest pain.  25 tablet  4  . ONE TOUCH TEST STRIPS test strip TEST AS  DIRECTED  100 each  11  . potassium chloride SA (K-DUR,KLOR-CON) 20 MEQ tablet Take 2 tablets (40 mEq total) by mouth 3 (three) times daily.  540 tablet  2  . ramipril (ALTACE) 10 MG capsule Take 1 capsule (10 mg total) by mouth daily.  180 capsule  2  . ramipril (ALTACE) 10 MG tablet Take 10 mg by mouth 2 (two) times daily.      [name] warfarin (COUMADIN) 5 MG tablet Take 1 tablet (5 mg total) by mouth as directed.  90 tablet  3   Review of Systems Review of Systems  Constitutional: Negative for diaphoresis, activity change, appetite change and unexpected weight change.  HENT: Negative for hearing loss, ear pain, facial swelling, mouth sores and neck stiffness.   Eyes: Negative for pain, redness and visual disturbance.  Respiratory: Negative for shortness of breath and wheezing.   Cardiovascular: Negative for chest pain and palpitations.  Gastrointestinal: Negative for diarrhea, blood in stool, abdominal distention and rectal pain.  Genitourinary: Negative for hematuria, flank pain and decreased urine volume.  Musculoskeletal: Negative for myalgias and joint swelling.  Skin: Negative for color change and wound.  Neurological: Negative for syncope and numbness.  Hematological: Negative for adenopathy.  Psychiatric/Behavioral: Negative for hallucinations, self-injury, decreased concentration and agitation.      Objective:   Physical Exam BP 108/72  Pulse 63  Temp 97.7 F (36.5 C) (Oral)  Ht 6' (1.829 m)  Wt 259 lb (117.482 kg)  BMI 35.13 kg/m2  SpO2 94% Physical Exam  VS noted Constitutional: Pt is oriented to person, place, and time. Appears well-developed and well-nourished. [name] HENT:  Head: Normocephalic and atraumatic.  Right Ear: External ear normal.  Left Ear: External ear normal.  Nose: Nose normal.  Mouth/Throat: Oropharynx is clear and moist.  Eyes: Conjunctivae and EOM are normal. Pupils are equal, round, and reactive to light.  Neck: Normal range of motion. Neck  supple. No JVD present. No tracheal deviation present.  Cardiovascular: Normal rate, regular rhythm, normal heart sounds and intact distal pulses.   Pulmonary/Chest: Effort normal and breath sounds normal.  Abdominal: Soft. Bowel sounds are normal. There is no tenderness.  Musculoskeletal: Normal range of motion. Exhibits no edema.  Lymphadenopathy:  Has no cervical adenopathy.  Neurological: Pt is alert and oriented to person, place, and time. Pt has normal reflexes. No cranial nerve deficit.  Skin: Skin is warm and dry. No rash noted.  Psychiatric:  Has  normal mood and affect. Behavior is normal.  Assessment & Plan:

## 2012-03-18 NOTE — Assessment & Plan Note (Addendum)
Improved after wax impaction irrigation today

## 2012-03-18 NOTE — Assessment & Plan Note (Signed)
Ok to change coumadin clinic to [facility] due to less cost/copay

## 2012-03-18 NOTE — Telephone Encounter (Signed)
Put lab order in for physical labs.

## 2012-03-18 NOTE — Patient Instructions (Addendum)
Your left ear was irrigated of wax today Continue all other medications as before Please have the pharmacy call with any other refills you may need. Your blood work was drawn this morning. Thank you for enrolling in [facility]. Please follow the instructions below to securely access your online medical record. MyChart allows you to send messages to your doctor, view your test results, renew your prescriptions, schedule appointments, and more. To Log into MyChart, please go to [email or web address], and your Username is: [email or web address] You are given the prescription for the compression stockings. You are otherwise up to date with prevention measures. You should be called soon about the change in coumadin clinic to the [facility] site.  Please keep your appointments with your specialists as you have planned - cardiology Please return in 6 mo with Lab testing done 3-5 days before

## 2012-03-18 NOTE — Assessment & Plan Note (Signed)
Overall doing well, age appropriate education and counseling updated, referrals for preventative services and immunizations addressed, dietary and smoking counseling addressed, most recent labs and ECG reviewed.  I have personally reviewed and have noted: 1) the patient's medical and social history 2) The pt's use of alcohol, tobacco, and illicit drugs 3) The patient's current medications and supplements 4) Functional ability including ADL's, fall risk, home safety risk, hearing and visual impairment 5) Diet and physical activities 6) Evidence for depression or mood disorder 7) The patient's height, weight, and BMI have been recorded in the chart I have made referrals, and provided counseling and education based on review of the above To f/u labs drawn this am

## 2012-03-18 NOTE — Assessment & Plan Note (Signed)
stable overall by hx and exam, most recent data reviewed with pt, and pt to continue medical treatment as before Lab Results  Component Value Date   HGBA1C 5.6 09/08/2011   To f/u a1c done this am

## 2012-03-22 NOTE — Telephone Encounter (Signed)
LMOM for patient to call coumadin clinic to schedule appointment for INR.

## 2012-03-23 ENCOUNTER — Encounter: Payer: Self-pay | Admitting: *Deleted

## 2012-03-29 ENCOUNTER — Encounter: Payer: Medicare Other | Admitting: Internal Medicine

## 2012-04-13 ENCOUNTER — Ambulatory Visit (INDEPENDENT_AMBULATORY_CARE_PROVIDER_SITE_OTHER): Payer: Medicare Other | Admitting: Internal Medicine

## 2012-04-13 ENCOUNTER — Encounter: Payer: Self-pay | Admitting: Internal Medicine

## 2012-04-13 VITALS — BP 142/100 | HR 80 | Ht 72.0 in | Wt 264.0 lb

## 2012-04-13 DIAGNOSIS — I4891 Unspecified atrial fibrillation: Secondary | ICD-10-CM

## 2012-04-13 DIAGNOSIS — Z95810 Presence of automatic (implantable) cardiac defibrillator: Secondary | ICD-10-CM

## 2012-04-13 LAB — ICD DEVICE OBSERVATION
DEVICE MODEL ICD: 311254
RV LEAD IMPEDENCE ICD: 460 Ohm
TOT-0006: 20130807000000
TOT-0007: 2
TOT-0009: 2
TZAT-0001FASTVT: 1
TZAT-0004FASTVT: 8
TZAT-0012FASTVT: 200 ms
TZAT-0018SLOWVT: NEGATIVE
TZON-0004FASTVT: 12
TZON-0008SLOWVT: 80 ms
TZON-0010SLOWVT: 80 ms
TZST-0001FASTVT: 3
TZST-0001SLOWVT: 2
TZST-0001SLOWVT: 4
TZST-0001SLOWVT: 5
TZST-0003FASTVT: 25 J
TZST-0003FASTVT: 36 J
TZST-0003SLOWVT: 36 J
VENTRICULAR PACING ICD: 1 pct

## 2012-04-13 NOTE — Progress Notes (Signed)
HPI [name] returns today for followup. He is a very pleasant middle-age man with a nonischemic cardiomyopathy, chronic systolic heart failure, paroxysmal atrial fibrillation, status post ICD implantation. In the interim, he has done fairly well. No recent hospitalizations. He denies chest pain. He has class II heart failure symptoms. He admits to some dietary indiscretion. He did not take his medications this morning. No Known Allergies   Current Outpatient Prescriptions  Medication Sig Dispense Refill  . allopurinol (ZYLOPRIM) 300 MG tablet TAKE 1 TABLET BY MOUTH DAILY  90 tablet  2  . amLODipine (NORVASC) 10 MG tablet Take 1 tablet (10 mg total) by mouth daily.  90 tablet  2  . atorvastatin (LIPITOR) 40 MG tablet Take 1 tablet (40 mg total) by mouth daily.  90 tablet  2  . B-D ULTRAFINE III SHORT PEN 31G X 8 MM MISC USE ONCE DAILY AS DIRECTED  100 each  3  . carvedilol (COREG) 25 MG tablet Take 2 tablets (50 mg total) by mouth 2 (two) times daily with a meal.  360 tablet  2  . furosemide (LASIX) 80 MG tablet Take 1 tablet (80 mg total) by mouth 2 (two) times daily.  180 tablet  2  . hydrALAZINE (APRESOLINE) 50 MG tablet Take 1 tablet (50 mg total) by mouth every 8 (eight) hours.  90 tablet  6  . insulin glargine (LANTUS SOLOSTAR) 100 UNIT/ML injection Inject 10 Units into the skin at bedtime.      . isosorbide dinitrate (ISORDIL) 20 MG tablet Take 20 mg by mouth 3 (three) times daily.      . Lancets (ONETOUCH ULTRASOFT) lancets TEST AS DIRECTED  100 each  11  . metFORMIN (GLUCOPHAGE) 500 MG tablet Take 500 mg by mouth 2 (two) times daily.      . Multiple Vitamin (MULTIVITAMIN) capsule Take 1 capsule by mouth daily.       . nitroGLYCERIN (NITROSTAT) 0.4 MG SL tablet Place 1 tablet (0.4 mg total) under the tongue every 5 (five) minutes x 3 doses as needed for chest pain.  25 tablet  4  . ONE TOUCH TEST STRIPS test strip TEST AS DIRECTED  100 each  11  . potassium chloride SA (K-DUR,KLOR-CON)  20 MEQ tablet Take 2 tablets (40 mEq total) by mouth 3 (three) times daily.  540 tablet  2  . ramipril (ALTACE) 10 MG tablet Take 10 mg by mouth 2 (two) times daily.      [name] warfarin (COUMADIN) 5 MG tablet Take 1 tablet (5 mg total) by mouth as directed.  90 tablet  3     Past Medical History  Diagnosis Date  . CHF (congestive heart failure)     a. Likely NICM (out of proportion to CAD). b. 2009 - EF 25-30% by echo. c. s/p St. Jude ICD implantation 2007.  [name] Cerebrovascular accident     a. Basilar CVA 2000.  [name] Gout   . Hypokalemia   . HTN (hypertension)   . Lipoma   . Dyslipidemia   . Acute, but ill-defined, cerebrovascular disease   . CAD (coronary artery disease)     a. Nonobstructive by cath 2009.  [name] Atrial fibrillation     a. Noted 05/2008 by EKG.  . Paroxysmal VT     a. s/p St. Jude ICD 2007. b. H/o paroxysmal VT/VF.  [name] Cardiomyopathy   . Anginal pain   . ICD (implantable cardiac defibrillator) in place   . Shortness of breath  only with CHF  . OSA (obstructive sleep apnea)     USES cpap  . DM (diabetes mellitus)     insulin dependent    ROS:   All systems reviewed and negative except as noted in the HPI.   Past Surgical History  Procedure Date  . Cardiac catheterization     Nonobstructive coronary disease 2009  . Cardiac defibrillator placement     ICD-St. Jude  . Lipoma excision      Family History  Problem Relation Age of Onset  . Coronary artery disease Mother      History   Social History  . Marital Status: Married    Spouse Name: N/A    Number of Children: N/A  . Years of Education: N/A   Occupational History  . Not on file.   Social History Main Topics  . Smoking status: Former Smoker    Quit date: 05/04/1986  . Smokeless tobacco: Never Used     Comment: Quit smoking 30 yrs ago. Smoked as teenager less than 1/2 ppd. Smoked x 4 years.  . Alcohol Use: Yes     Comment: quit 11/2011  . Drug Use: No  . Sexually Active: Yes   Other  Topics Concern  . Not on file   Social History Narrative  . No narrative on file     BP 142/100  Pulse 80  Ht 6' (1.829 m)  Wt 264 lb (119.75 kg)  BMI 35.80 kg/m2  SpO2 98%  Physical Exam:  Well appearing middle-aged man, NAD HEENT: Unremarkable Neck:  No JVD, no thyromegally Lungs:  Clear with no wheezes, rales, or rhonchi. HEART:  Regular rate rhythm, no murmurs, no rubs, no clicks Abd:  soft, positive bowel sounds, no organomegally, no rebound, no guarding Ext:  2 plus pulses, no edema, no cyanosis, no clubbing Skin:  No rashes no nodules Neuro:  CN II through XII intact, motor grossly intact  DEVICE  Normal device function.  See PaceArt for details.   Assess/Plan:

## 2012-04-13 NOTE — Patient Instructions (Signed)
Your physician recommends that you schedule a follow-up appointment in: 3 months with device clinic and 12 months with Dr Lovena Le

## 2012-04-15 ENCOUNTER — Ambulatory Visit (INDEPENDENT_AMBULATORY_CARE_PROVIDER_SITE_OTHER): Payer: Medicare Other | Admitting: General Practice

## 2012-04-15 DIAGNOSIS — I4891 Unspecified atrial fibrillation: Secondary | ICD-10-CM

## 2012-04-15 DIAGNOSIS — Z8679 Personal history of other diseases of the circulatory system: Secondary | ICD-10-CM

## 2012-04-15 DIAGNOSIS — Z7901 Long term (current) use of anticoagulants: Secondary | ICD-10-CM

## 2012-05-12 ENCOUNTER — Telehealth: Payer: Self-pay | Admitting: Cardiology

## 2012-05-12 MED ORDER — CARVEDILOL 25 MG PO TABS: 50.0000 mg | ORAL_TABLET | Freq: Two times a day (BID) | ORAL | Status: DC

## 2012-05-12 MED ORDER — RAMIPRIL 10 MG PO TABS: 10.0000 mg | ORAL_TABLET | Freq: Two times a day (BID) | ORAL | Status: DC

## 2012-05-12 MED ORDER — AMLODIPINE BESYLATE 10 MG PO TABS: 10.0000 mg | ORAL_TABLET | Freq: Every day | ORAL | Status: DC

## 2012-05-12 MED ORDER — ISOSORBIDE DINITRATE 20 MG PO TABS: 20.0000 mg | ORAL_TABLET | Freq: Three times a day (TID) | ORAL | Status: DC

## 2012-05-12 MED ORDER — ATORVASTATIN CALCIUM 40 MG PO TABS: 40.0000 mg | ORAL_TABLET | Freq: Every day | ORAL | Status: DC

## 2012-05-12 MED ORDER — HYDRALAZINE HCL 50 MG PO TABS: 50.0000 mg | ORAL_TABLET | Freq: Three times a day (TID) | ORAL | Status: DC

## 2012-05-12 MED ORDER — POTASSIUM CHLORIDE CRYS ER 20 MEQ PO TBCR: 40.0000 meq | EXTENDED_RELEASE_TABLET | Freq: Three times a day (TID) | ORAL | Status: DC

## 2012-05-12 MED ORDER — FUROSEMIDE 80 MG PO TABS: 80.0000 mg | ORAL_TABLET | Freq: Two times a day (BID) | ORAL | Status: DC

## 2012-05-12 NOTE — Telephone Encounter (Signed)
Spoke with patient he stated he needs refills on his medications 90 day supply sent to [facility] on [address].Prescriptions sent to pharmacy.

## 2012-05-12 NOTE — Telephone Encounter (Signed)
Pt calling re medication directions

## 2012-05-13 ENCOUNTER — Other Ambulatory Visit: Payer: Self-pay | Admitting: General Practice

## 2012-05-13 ENCOUNTER — Ambulatory Visit (INDEPENDENT_AMBULATORY_CARE_PROVIDER_SITE_OTHER): Payer: Medicare Other | Admitting: General Practice

## 2012-05-13 DIAGNOSIS — Z8679 Personal history of other diseases of the circulatory system: Secondary | ICD-10-CM

## 2012-05-13 DIAGNOSIS — I4891 Unspecified atrial fibrillation: Secondary | ICD-10-CM

## 2012-05-13 DIAGNOSIS — Z7901 Long term (current) use of anticoagulants: Secondary | ICD-10-CM

## 2012-05-13 MED ORDER — WARFARIN SODIUM 5 MG PO TABS: 5.0000 mg | ORAL_TABLET | Freq: Every day | ORAL | Status: DC

## 2012-05-24 ENCOUNTER — Emergency Department (HOSPITAL_COMMUNITY): Payer: Medicare Other

## 2012-05-24 ENCOUNTER — Encounter (HOSPITAL_COMMUNITY): Payer: Self-pay | Admitting: Emergency Medicine

## 2012-05-24 ENCOUNTER — Emergency Department (HOSPITAL_COMMUNITY)
Admission: EM | Admit: 2012-05-24 | Discharge: 2012-05-24 | Disposition: A | Discharge status: Home / Self Care | Payer: Medicare Other | Attending: Emergency Medicine | Admitting: Emergency Medicine

## 2012-05-24 DIAGNOSIS — Z7901 Long term (current) use of anticoagulants: Secondary | ICD-10-CM | POA: Insufficient documentation

## 2012-05-24 DIAGNOSIS — I509 Heart failure, unspecified: Secondary | ICD-10-CM | POA: Insufficient documentation

## 2012-05-24 DIAGNOSIS — Z87891 Personal history of nicotine dependence: Secondary | ICD-10-CM | POA: Insufficient documentation

## 2012-05-24 DIAGNOSIS — Z859 Personal history of malignant neoplasm, unspecified: Secondary | ICD-10-CM | POA: Insufficient documentation

## 2012-05-24 DIAGNOSIS — E119 Type 2 diabetes mellitus without complications: Secondary | ICD-10-CM | POA: Insufficient documentation

## 2012-05-24 DIAGNOSIS — I209 Angina pectoris, unspecified: Secondary | ICD-10-CM | POA: Insufficient documentation

## 2012-05-24 DIAGNOSIS — Z8673 Personal history of transient ischemic attack (TIA), and cerebral infarction without residual deficits: Secondary | ICD-10-CM | POA: Insufficient documentation

## 2012-05-24 DIAGNOSIS — I208 Other forms of angina pectoris: Secondary | ICD-10-CM

## 2012-05-24 DIAGNOSIS — Z79899 Other long term (current) drug therapy: Secondary | ICD-10-CM | POA: Insufficient documentation

## 2012-05-24 DIAGNOSIS — M109 Gout, unspecified: Secondary | ICD-10-CM | POA: Insufficient documentation

## 2012-05-24 DIAGNOSIS — Z8679 Personal history of other diseases of the circulatory system: Secondary | ICD-10-CM | POA: Insufficient documentation

## 2012-05-24 DIAGNOSIS — Z9889 Other specified postprocedural states: Secondary | ICD-10-CM | POA: Insufficient documentation

## 2012-05-24 DIAGNOSIS — I2510 Atherosclerotic heart disease of native coronary artery without angina pectoris: Secondary | ICD-10-CM | POA: Insufficient documentation

## 2012-05-24 DIAGNOSIS — I10 Essential (primary) hypertension: Secondary | ICD-10-CM | POA: Insufficient documentation

## 2012-05-24 DIAGNOSIS — Z8709 Personal history of other diseases of the respiratory system: Secondary | ICD-10-CM | POA: Insufficient documentation

## 2012-05-24 DIAGNOSIS — G4733 Obstructive sleep apnea (adult) (pediatric): Secondary | ICD-10-CM | POA: Insufficient documentation

## 2012-05-24 DIAGNOSIS — Z95810 Presence of automatic (implantable) cardiac defibrillator: Secondary | ICD-10-CM | POA: Insufficient documentation

## 2012-05-24 DIAGNOSIS — E785 Hyperlipidemia, unspecified: Secondary | ICD-10-CM | POA: Insufficient documentation

## 2012-05-24 DIAGNOSIS — Z794 Long term (current) use of insulin: Secondary | ICD-10-CM | POA: Insufficient documentation

## 2012-05-24 LAB — CBC WITH DIFFERENTIAL/PLATELET
Basophils Absolute: 0 10*3/uL (ref 0.0–0.1)
Basophils Relative: 0 % (ref 0–1)
MCHC: 33.2 g/dL (ref 30.0–36.0)
Neutro Abs: 4 10*3/uL (ref 1.7–7.7)
Neutrophils Relative %: 72 % (ref 43–77)
Platelets: 230 10*3/uL (ref 150–400)
RDW: 13.7 % (ref 11.5–15.5)

## 2012-05-24 LAB — BASIC METABOLIC PANEL
BUN: 10 mg/dL (ref 6–23)
Calcium: 8.7 mg/dL (ref 8.4–10.5)
GFR calc non Af Amer: >90 mL/min (ref >90)
Glucose, Bld: 95 mg/dL (ref 70–99)

## 2012-05-24 LAB — TROPONIN I: Troponin I: <0.3 ng/mL (ref <0.30)

## 2012-05-24 MED ORDER — ASPIRIN 325 MG PO TABS: 325.0000 mg | ORAL_TABLET | Freq: Every day | ORAL | Status: DC

## 2012-05-24 NOTE — ED Provider Notes (Signed)
Pt with h/o nonischemic CM (last cath noted 2005 showed minimal disease) He reports he had CP today which sounds like his stable angina He reports he did not take NTG as "I don't like how it makes me feel" but when he had CP he reports he knew he should have taken it. He is CP free at this time He reports he has not had severe CP since around noon Will repeat troponin/ekg, if negative stable for d/c and he already has f/u with cardiology I have reviewed the EKGs  Sharyon Cable, MD 05/24/12 1534

## 2012-05-24 NOTE — ED Notes (Signed)
Pt is here with cough for a while and today woke up shortness of breath and got on bus.  Got off bus and states that got chest tightness with walking.  Pt reports productive cough with yellow sputum.  Has pacer and has LBBB.  Pt pain is down to 1/10 with rest and getting out of cold

## 2012-05-24 NOTE — ED Provider Notes (Signed)
History     CSN: [id number]  Arrival date & time 05/24/12  1251   First MD Initiated Contact with Patient 05/24/12 1255      Chief Complaint  Patient presents with  . Chest Pain    (Consider location/radiation/quality/duration/timing/severity/associated sxs/prior treatment) HPI Comments: This is a 57 year old male, past medical history remarkable for CHF, CAD, A. fib, hypertension, hyperlipidemia, and diabetes, who presents emergency department with chief complaint of chest tightness. Patient states that while walking to work earlier today, he noticed some chest tightness, which she said was 1/10 in severity. Patient has an internal defibrillator, and states that anytime he feels any chest discomfort and makes him very nervous, and he desired to be evaluated today. His cardiologist is Dr. Percival Spanish. Patient was given aspirin by EMS. Patient denies any discomfort at this time. Additionally, he states that over the past 3 weeks, he has suffered from cough and cold symptoms with associated sinus congestion. He was recently treated at an urgent care with azithromycin. He states that his cough and congestion have been improving, but he still has 2 pillow orthopnea and wheezing at night.  The history is provided by the patient. No language interpreter was used.    Past Medical History  Diagnosis Date  . CHF (congestive heart failure)     a. Likely NICM (out of proportion to CAD). b. 2009 - EF 25-30% by echo. c. s/p St. Jude ICD implantation 2007.  [name] Cerebrovascular accident     a. Basilar CVA 2000.  [name] Gout   . Hypokalemia   . HTN (hypertension)   . Lipoma   . Dyslipidemia   . Acute, but ill-defined, cerebrovascular disease   . CAD (coronary artery disease)     a. Nonobstructive by cath 2009.  [name] Atrial fibrillation     a. Noted 05/2008 by EKG.  . Paroxysmal VT     a. s/p St. Jude ICD 2007. b. H/o paroxysmal VT/VF.  [name] Cardiomyopathy   . Anginal pain   . ICD (implantable cardiac  defibrillator) in place   . Shortness of breath     only with CHF  . OSA (obstructive sleep apnea)     USES cpap  . DM (diabetes mellitus)     insulin dependent    Past Surgical History  Procedure Date  . Cardiac catheterization     Nonobstructive coronary disease 2009  . Cardiac defibrillator placement     ICD-St. Jude  . Lipoma excision     Family History  Problem Relation Age of Onset  . Coronary artery disease Mother     History  Substance Use Topics  . Smoking status: Former Smoker    Quit date: 05/04/1986  . Smokeless tobacco: Never Used     Comment: Quit smoking 30 yrs ago. Smoked as teenager less than 1/2 ppd. Smoked x 4 years.  . Alcohol Use: Yes     Comment: quit 11/2011      Review of Systems  All other systems reviewed and are negative.    Allergies  Review of patient's allergies indicates no known allergies.  Home Medications   Current Outpatient Rx  Name  Route  Sig  Dispense  Refill  . ALLOPURINOL 300 MG PO TABS      TAKE 1 TABLET BY MOUTH DAILY   90 tablet   2   . AMLODIPINE BESYLATE 10 MG PO TABS   Oral   Take 1 tablet (10 mg total) by mouth daily.  90 tablet   3   . ATORVASTATIN CALCIUM 40 MG PO TABS   Oral   Take 1 tablet (40 mg total) by mouth daily.   90 tablet   3   . BD PEN NEEDLE SHORT U/F 31G X 8 MM MISC      USE ONCE DAILY AS DIRECTED   100 each   3   . CARVEDILOL 25 MG PO TABS   Oral   Take 2 tablets (50 mg total) by mouth 2 (two) times daily with a meal.   360 tablet   3   . FUROSEMIDE 80 MG PO TABS   Oral   Take 1 tablet (80 mg total) by mouth 2 (two) times daily.   180 tablet   3   . HYDRALAZINE HCL 50 MG PO TABS   Oral   Take 1 tablet (50 mg total) by mouth every 8 (eight) hours.   270 tablet   3   . INSULIN GLARGINE 100 UNIT/ML  SOLN   Subcutaneous   Inject 10 Units into the skin at bedtime.         . ISOSORBIDE DINITRATE 20 MG PO TABS   Oral   Take 1 tablet (20 mg total) by mouth 3  (three) times daily.   270 tablet   3   . ONETOUCH ULTRASOFT LANCETS MISC      TEST AS DIRECTED   100 each   11   . METFORMIN HCL 500 MG PO TABS   Oral   Take 500 mg by mouth 2 (two) times daily.         . MULTIVITAMINS PO CAPS   Oral   Take 1 capsule by mouth daily.          [name] NITROGLYCERIN 0.4 MG SL SUBL   Sublingual   Place 1 tablet (0.4 mg total) under the tongue every 5 (five) minutes x 3 doses as needed for chest pain.   25 tablet   4   . ONETOUCH TEST VI STRP      TEST AS DIRECTED   100 each   11   . POTASSIUM CHLORIDE CRYS ER 20 MEQ PO TBCR   Oral   Take 2 tablets (40 mEq total) by mouth 3 (three) times daily.   540 tablet   3   . RAMIPRIL 10 MG PO TABS   Oral   Take 1 tablet (10 mg total) by mouth 2 (two) times daily.   180 tablet   3   . WARFARIN SODIUM 5 MG PO TABS   Oral   Take 1 tablet (5 mg total) by mouth daily. Take as directed by coumadin clinic.   90 tablet   3     90 tabs is 30 day supply     BP 129/85  Pulse 76  Temp 97.9 F (36.6 C) (Oral)  Resp 14  SpO2 99%  Physical Exam  Nursing note and vitals reviewed. Constitutional: He is oriented to person, place, and time. He appears well-developed and well-nourished.  HENT:  Head: Normocephalic and atraumatic.  Eyes: Conjunctivae normal and EOM are normal. Right eye exhibits no discharge. Left eye exhibits no discharge. No scleral icterus.  Neck: Normal range of motion. Neck supple. No JVD present.  Cardiovascular: Normal rate, regular rhythm, normal heart sounds and intact distal pulses.  Exam reveals no gallop and no friction rub.   No murmur heard. Pulmonary/Chest: Effort normal and breath sounds normal. No respiratory distress. He  has no wheezes. He has no rales. He exhibits no tenderness.  Abdominal: Soft. Bowel sounds are normal. He exhibits no distension and no mass. There is no tenderness. There is no rebound and no guarding.  Musculoskeletal: Normal range of motion. He  exhibits no edema and no tenderness.  Neurological: He is alert and oriented to person, place, and time.  Skin: Skin is warm and dry.  Psychiatric: He has a normal mood and affect. His behavior is normal. Judgment and thought content normal.    ED Course  Procedures (including critical care time)   Labs Reviewed  CBC WITH DIFFERENTIAL  BASIC METABOLIC PANEL  TROPONIN I   Results for orders placed during the hospital encounter of 05/24/12  CBC WITH DIFFERENTIAL      Component Value Range   WBC 5.6  4.0 - 10.5 K/uL   RBC 3.97 (*) 4.22 - 5.81 MIL/uL   Hemoglobin 12.4 (*) 13.0 - 17.0 g/dL   HCT 37.4 (*) 39.0 - 52.0 %   MCV 94.2  78.0 - 100.0 fL   MCH 31.2  26.0 - 34.0 pg   MCHC 33.2  30.0 - 36.0 g/dL   RDW 13.7  11.5 - 15.5 %   Platelets 230  150 - 400 K/uL   Neutrophils Relative 72  43 - 77 %   Neutro Abs 4.0  1.7 - 7.7 K/uL   Lymphocytes Relative 18  12 - 46 %   Lymphs Abs 1.0  0.7 - 4.0 K/uL   Monocytes Relative 8  3 - 12 %   Monocytes Absolute 0.5  0.1 - 1.0 K/uL   Eosinophils Relative 1  0 - 5 %   Eosinophils Absolute 0.1  0.0 - 0.7 K/uL   Basophils Relative 0  0 - 1 %   Basophils Absolute 0.0  0.0 - 0.1 K/uL  BASIC METABOLIC PANEL      Component Value Range   Sodium 145  135 - 145 mEq/L   Potassium 3.5  3.5 - 5.1 mEq/L   Chloride 109  96 - 112 mEq/L   CO2 26  19 - 32 mEq/L   Glucose, Bld 95  70 - 99 mg/dL   BUN 10  6 - 23 mg/dL   Creatinine, Ser 0.85  0.50 - 1.35 mg/dL   Calcium 8.7  8.4 - 10.5 mg/dL   GFR calc non Af Amer >90  >90 mL/min   GFR calc Af Amer >90  >90 mL/min  TROPONIN I      Component Value Range   Troponin I <0.30  <0.30 ng/mL  PRO B NATRIURETIC PEPTIDE      Component Value Range   Pro B Natriuretic peptide (BNP) 1784.0 (*) 0 - 125 pg/mL  PROTIME-INR      Component Value Range   Prothrombin Time 27.0 (*) 11.6 - 15.2 seconds   INR 2.65 (*) 0.00 - 1.49  APTT      Component Value Range   aPTT 40 (*) 24 - 37 seconds   Dg Chest 2  View  05/24/2012  *RADIOLOGY REPORT*  Clinical Data: Chest pain and shortness of breath  CHEST - 2 VIEW  Comparison: December 06, 2011  Findings: Heart is moderately enlarged with normal pulmonary vascularity, stable.  Pacemaker lead is attached to the right heart.  There is no edema or consolidation.  No adenopathy.  No bone lesions.  IMPRESSION: Stable cardiac enlargement.  No edema or consolidation.   Original Report Authenticated By: Lowella Grip,  M.D.      ED ECG REPORT  I personally interpreted this EKG   Date: 05/24/2012   Rate: 77  Rhythm: normal sinus rhythm  QRS Axis: normal  Intervals: normal  ST/T Wave abnormalities: nonspecific ST/T changes  Conduction Disutrbances:none  Narrative Interpretation: PAC, borderline AV conduction delay, left atrial abnormality, nonspecific IVCD with LAD, prior lateral infarct  Old EKG Reviewed: changes noted  3:52 PM ED ECG REPORT  I personally interpreted this EKG   Date: 05/24/2012   Rate: 73  Rhythm: normal sinus rhythm  QRS Axis: normal  Intervals: normal  ST/T Wave abnormalities: nonspecific ST/T changes  Conduction Disutrbances:none  Narrative Interpretation:   Old EKG Reviewed: unchanged    1. Stable angina       MDM  57 year old male with anginal type chest pain. Believe the patient's pain to be stable angina, as he had symptoms while walking, and they were relieved with rest. Patient states that he has had similar episodes, and that they are relieved with nitroglycerin, however he did not want to take a nitroglycerin while at work.  2:34 PM Will repeat EKG and Troponin, which if normal, will discharge the patient to home with cardiology follow-up. This patient was discussed with and seen by Dr. Christy Gentles, who agrees with the plan.         Montine Circle, PA-C 05/27/12 0004

## 2012-05-24 NOTE — ED Notes (Signed)
Pt /o chest tightness. Was SOB walking to work. Pt denies pain, 325 asa given by EMS

## 2012-05-27 NOTE — ED Provider Notes (Signed)
Medical screening examination/treatment/procedure(s) were conducted as a shared visit with non-physician practitioner(s) and myself.  I personally evaluated the patient during the encounter   Sharyon Cable, MD 05/27/12 2320

## 2012-06-21 ENCOUNTER — Encounter: Payer: Self-pay | Admitting: Cardiology

## 2012-06-21 ENCOUNTER — Ambulatory Visit (INDEPENDENT_AMBULATORY_CARE_PROVIDER_SITE_OTHER): Payer: Medicare Other | Admitting: Cardiology

## 2012-06-21 VITALS — BP 135/90 | HR 88 | Ht 72.0 in | Wt 264.8 lb

## 2012-06-21 DIAGNOSIS — I2510 Atherosclerotic heart disease of native coronary artery without angina pectoris: Secondary | ICD-10-CM

## 2012-06-21 DIAGNOSIS — I509 Heart failure, unspecified: Secondary | ICD-10-CM

## 2012-06-21 DIAGNOSIS — I472 Ventricular tachycardia, unspecified: Secondary | ICD-10-CM

## 2012-06-21 DIAGNOSIS — I4891 Unspecified atrial fibrillation: Secondary | ICD-10-CM

## 2012-06-21 DIAGNOSIS — I10 Essential (primary) hypertension: Secondary | ICD-10-CM

## 2012-06-21 DIAGNOSIS — I6789 Other cerebrovascular disease: Secondary | ICD-10-CM

## 2012-06-21 MED ORDER — NITROGLYCERIN 0.4 MG SL SUBL: 0.4000 mg | SUBLINGUAL_TABLET | SUBLINGUAL | Status: DC | PRN

## 2012-06-21 NOTE — Progress Notes (Signed)
HPI This patient presents for followup after a recent ER visit for chest pain. He was at work when this happened. He was his usual chest pain but he did not have his nitroglycerin. He's been getting this discomfort infrequently particularly after he walks a long distance. However, a minutes after taking his nitroglycerin the pain disappears. He's taken off for over the last month which is a stable pattern. His pain is not reproducible with activities. He denies any resting chest pressure, neck or arm discomfort. He's had no new shortness of breath, PND or orthopnea. He has had no new palpitations, presyncope or syncope.  No Known Allergies  Current Outpatient Prescriptions  Medication Sig Dispense Refill  . allopurinol (ZYLOPRIM) 300 MG tablet TAKE 1 TABLET BY MOUTH DAILY  90 tablet  2  . amLODipine (NORVASC) 10 MG tablet Take 1 tablet (10 mg total) by mouth daily.  90 tablet  3  . atorvastatin (LIPITOR) 40 MG tablet Take 1 tablet (40 mg total) by mouth daily.  90 tablet  3  . B-D ULTRAFINE III SHORT PEN 31G X 8 MM MISC USE ONCE DAILY AS DIRECTED  100 each  3  . carvedilol (COREG) 25 MG tablet Take 2 tablets (50 mg total) by mouth 2 (two) times daily with a meal.  360 tablet  3  . furosemide (LASIX) 80 MG tablet Take 1 tablet (80 mg total) by mouth 2 (two) times daily.  180 tablet  3  . hydrALAZINE (APRESOLINE) 50 MG tablet Take 1 tablet (50 mg total) by mouth every 8 (eight) hours.  270 tablet  3  . insulin glargine (LANTUS SOLOSTAR) 100 UNIT/ML injection Inject 10 Units into the skin at bedtime.      . isosorbide dinitrate (ISORDIL) 20 MG tablet Take 1 tablet (20 mg total) by mouth 3 (three) times daily.  270 tablet  3  . Lancets (ONETOUCH ULTRASOFT) lancets TEST AS DIRECTED  100 each  11  . metFORMIN (GLUCOPHAGE) 500 MG tablet Take 500 mg by mouth 2 (two) times daily.      . Multiple Vitamin (MULTIVITAMIN) capsule Take 1 capsule by mouth daily.       . nitroGLYCERIN (NITROSTAT) 0.4 MG SL  tablet Place 1 tablet (0.4 mg total) under the tongue every 5 (five) minutes x 3 doses as needed for chest pain.  25 tablet  4  . ONE TOUCH TEST STRIPS test strip TEST AS DIRECTED  100 each  11  . potassium chloride SA (K-DUR,KLOR-CON) 20 MEQ tablet Take 2 tablets (40 mEq total) by mouth 3 (three) times daily.  540 tablet  3  . ramipril (ALTACE) 10 MG tablet Take 1 tablet (10 mg total) by mouth 2 (two) times daily.  180 tablet  3  . warfarin (COUMADIN) 5 MG tablet Take 15 mg by mouth daily.       No current facility-administered medications for this visit.    Past Medical History  Diagnosis Date  . CHF (congestive heart failure)     a. Likely NICM (out of proportion to CAD). b. 2009 - EF 25-30% by echo. c. s/p St. Jude ICD implantation 2007.  [name] Cerebrovascular accident     a. Basilar CVA 2000.  [name] Gout   . Hypokalemia   . HTN (hypertension)   . Lipoma   . Dyslipidemia   . Acute, but ill-defined, cerebrovascular disease   . CAD (coronary artery disease)     a. Nonobstructive by cath 2009.  [name]  Atrial fibrillation     a. Noted 05/2008 by EKG.  . Paroxysmal VT     a. s/p St. Jude ICD 2007. b. H/o paroxysmal VT/VF.  [name] Cardiomyopathy   . Anginal pain   . ICD (implantable cardiac defibrillator) in place   . Shortness of breath     only with CHF  . OSA (obstructive sleep apnea)     USES cpap  . DM (diabetes mellitus)     insulin dependent    Past Surgical History  Procedure Laterality Date  . Cardiac catheterization      Nonobstructive coronary disease 2009  . Cardiac defibrillator placement      ICD-St. Jude  . Lipoma excision     ROS:  As stated in the HPI and negative for all other systems.  PHYSICAL EXAM BP 135/90  Pulse 88  Ht 6' (1.829 m)  Wt 264 lb 12.8 oz (120.112 kg)  BMI 35.91 kg/m2 GENERAL:  Well appearing HEENT:  Pupils equal round and reactive, fundi not visualized, oral mucosa unremarkable NECK:  No jugular venous distention, waveform within normal limits,  carotid upstroke brisk and symmetric, no bruits, no thyromegaly LYMPHATICS:  No cervical, inguinal adenopathy LUNGS:  Clear to auscultation bilaterally BACK:  No CVA tenderness CHEST:  Unremarkable, well-healed ICD pocket HEART:  PMI not displaced or sustained,S1 and S2 within normal limits, no S3, no S4, no clicks, no rubs, no murmurs ABD:  Flat, positive bowel sounds normal in frequency in pitch, no bruits, no rebound, no guarding, no midline pulsatile mass, no hepatomegaly, no splenomegaly EXT:  2 plus pulses throughout, no edema, no cyanosis no clubbing SKIN:  No rashes no nodules NEURO:  Cranial nerves II through XII grossly intact, motor grossly intact throughout PSYCH:  Cognitively intact, oriented to person place and time   ASSESSMENT AND PLAN   CAD -  The patient has a stable chest pain pattern. At this point I would not suggest any change to his medical regimen. He'll let me know if he has any increasing symptoms at which point I might suggest further testing. He has had nonobstructive disease previously.  Congestive heart failure, unspecified -  He has had no new symptoms since his last echo done in the fall. He seems to be euvolemic. He'll continue the meds as listed.  HYPERTENSION -  His blood pressure is better controlled.  He will continue the medications as ordered.  Atrial fibrillation-  He has had no symptomatic paroxysms of this. He's not interested in changing from warfarin to another agent.  Diabetes - His hemoglobin A1c last checked was 5.4. No change in therapy is indicated.

## 2012-06-21 NOTE — Patient Instructions (Addendum)
The current medical regimen is effective;  continue present plan and medications.  Follow up in 6 months with Dr Hochrein.  You will receive a letter in the mail 2 months before you are due.  Please call us when you receive this letter to schedule your follow up appointment.  

## 2012-06-24 ENCOUNTER — Ambulatory Visit (INDEPENDENT_AMBULATORY_CARE_PROVIDER_SITE_OTHER): Payer: Medicare Other | Admitting: General Practice

## 2012-06-24 DIAGNOSIS — Z8679 Personal history of other diseases of the circulatory system: Secondary | ICD-10-CM

## 2012-06-24 DIAGNOSIS — I4891 Unspecified atrial fibrillation: Secondary | ICD-10-CM

## 2012-06-24 DIAGNOSIS — Z7901 Long term (current) use of anticoagulants: Secondary | ICD-10-CM

## 2012-06-24 LAB — POCT INR: INR: 3.6

## 2012-07-15 ENCOUNTER — Ambulatory Visit (INDEPENDENT_AMBULATORY_CARE_PROVIDER_SITE_OTHER): Payer: Medicare Other | Admitting: Cardiology

## 2012-07-15 ENCOUNTER — Ambulatory Visit (INDEPENDENT_AMBULATORY_CARE_PROVIDER_SITE_OTHER): Payer: Medicare Other | Admitting: General Practice

## 2012-07-15 ENCOUNTER — Encounter: Payer: Self-pay | Admitting: Internal Medicine

## 2012-07-15 DIAGNOSIS — I4891 Unspecified atrial fibrillation: Secondary | ICD-10-CM

## 2012-07-15 DIAGNOSIS — Z95810 Presence of automatic (implantable) cardiac defibrillator: Secondary | ICD-10-CM

## 2012-07-15 LAB — ICD DEVICE OBSERVATION
DEVICE MODEL ICD: 311254
FVT: 0
HV IMPEDENCE: 42 Ohm
PACEART VT: 0
RV LEAD AMPLITUDE: 8.8 mv
RV LEAD THRESHOLD: 1.25 V
TZAT-0001FASTVT: 1
TZAT-0004SLOWVT: 8
TZAT-0012FASTVT: 200 ms
TZAT-0012SLOWVT: 200 ms
TZAT-0013SLOWVT: 4
TZAT-0018FASTVT: NEGATIVE
TZAT-0018SLOWVT: NEGATIVE
TZAT-0019FASTVT: 7.5 V
TZAT-0020FASTVT: 1 ms
TZON-0003SLOWVT: 320 ms
TZON-0004FASTVT: 12
TZON-0005FASTVT: 6
TZST-0001FASTVT: 4
TZST-0001SLOWVT: 3
TZST-0001SLOWVT: 5
TZST-0003FASTVT: 25 J
TZST-0003FASTVT: 36 J
TZST-0003FASTVT: 36 J
TZST-0003SLOWVT: 25 J

## 2012-07-15 LAB — POCT INR: INR: 3.5

## 2012-07-15 NOTE — Progress Notes (Signed)
ICD check/device clinic visit. See [facility] report.

## 2012-07-15 NOTE — Patient Instructions (Addendum)
Your physician recommends that you schedule a follow-up appointment in: 3 months with Device Clinic

## 2012-08-05 ENCOUNTER — Ambulatory Visit (INDEPENDENT_AMBULATORY_CARE_PROVIDER_SITE_OTHER): Payer: Medicare Other | Admitting: General Practice

## 2012-08-05 DIAGNOSIS — Z7901 Long term (current) use of anticoagulants: Secondary | ICD-10-CM

## 2012-08-05 DIAGNOSIS — Z8679 Personal history of other diseases of the circulatory system: Secondary | ICD-10-CM

## 2012-08-05 DIAGNOSIS — I4891 Unspecified atrial fibrillation: Secondary | ICD-10-CM

## 2012-08-26 ENCOUNTER — Ambulatory Visit (INDEPENDENT_AMBULATORY_CARE_PROVIDER_SITE_OTHER): Payer: Medicare Other | Admitting: General Practice

## 2012-08-26 DIAGNOSIS — Z8679 Personal history of other diseases of the circulatory system: Secondary | ICD-10-CM

## 2012-08-26 DIAGNOSIS — I4891 Unspecified atrial fibrillation: Secondary | ICD-10-CM

## 2012-08-26 DIAGNOSIS — Z7901 Long term (current) use of anticoagulants: Secondary | ICD-10-CM

## 2012-09-03 ENCOUNTER — Other Ambulatory Visit: Payer: Self-pay | Admitting: Internal Medicine

## 2012-09-16 ENCOUNTER — Other Ambulatory Visit: Payer: Self-pay | Admitting: General Practice

## 2012-09-16 ENCOUNTER — Encounter: Payer: Self-pay | Admitting: Internal Medicine

## 2012-09-16 ENCOUNTER — Other Ambulatory Visit (INDEPENDENT_AMBULATORY_CARE_PROVIDER_SITE_OTHER): Payer: Medicare Other

## 2012-09-16 ENCOUNTER — Ambulatory Visit (INDEPENDENT_AMBULATORY_CARE_PROVIDER_SITE_OTHER): Payer: Medicare Other | Admitting: General Practice

## 2012-09-16 ENCOUNTER — Ambulatory Visit (INDEPENDENT_AMBULATORY_CARE_PROVIDER_SITE_OTHER): Payer: Medicare Other | Admitting: Internal Medicine

## 2012-09-16 VITALS — BP 110/70 | HR 76 | Temp 97.6°F | Wt 267.0 lb

## 2012-09-16 DIAGNOSIS — E119 Type 2 diabetes mellitus without complications: Secondary | ICD-10-CM

## 2012-09-16 DIAGNOSIS — Z8679 Personal history of other diseases of the circulatory system: Secondary | ICD-10-CM

## 2012-09-16 DIAGNOSIS — I4891 Unspecified atrial fibrillation: Secondary | ICD-10-CM

## 2012-09-16 DIAGNOSIS — Z7901 Long term (current) use of anticoagulants: Secondary | ICD-10-CM

## 2012-09-16 DIAGNOSIS — E785 Hyperlipidemia, unspecified: Secondary | ICD-10-CM

## 2012-09-16 DIAGNOSIS — I10 Essential (primary) hypertension: Secondary | ICD-10-CM

## 2012-09-16 DIAGNOSIS — M109 Gout, unspecified: Secondary | ICD-10-CM

## 2012-09-16 DIAGNOSIS — Z0000 Encounter for general adult medical examination without abnormal findings: Secondary | ICD-10-CM

## 2012-09-16 DIAGNOSIS — T148XXA Other injury of unspecified body region, initial encounter: Secondary | ICD-10-CM

## 2012-09-16 LAB — BASIC METABOLIC PANEL
CO2: 30 mEq/L (ref 19–32)
Calcium: 8.7 mg/dL (ref 8.4–10.5)
Creatinine, Ser: 1 mg/dL (ref 0.4–1.5)
GFR: 97.02 mL/min (ref >60.00)
Glucose, Bld: 102 mg/dL — ABNORMAL HIGH (ref 70–99)
Sodium: 143 mEq/L (ref 135–145)

## 2012-09-16 LAB — LIPID PANEL
HDL: 36.5 mg/dL — ABNORMAL LOW (ref >39.00)
Triglycerides: 99 mg/dL (ref 0.0–149.0)
VLDL: 19.8 mg/dL (ref 0.0–40.0)

## 2012-09-16 LAB — HEPATIC FUNCTION PANEL
Albumin: 3.5 g/dL (ref 3.5–5.2)
Alkaline Phosphatase: 53 U/L (ref 39–117)
Total Protein: 6.4 g/dL (ref 6.0–8.3)

## 2012-09-16 LAB — HEMOGLOBIN A1C: Hgb A1c MFr Bld: 5.5 % (ref 4.6–6.5)

## 2012-09-16 LAB — POCT INR: INR: 2.9

## 2012-09-16 LAB — URIC ACID: Uric Acid, Serum: 8.1 mg/dL — ABNORMAL HIGH (ref 4.0–7.8)

## 2012-09-16 MED ORDER — WARFARIN SODIUM 5 MG PO TABS: ORAL_TABLET | ORAL | Status: DC

## 2012-09-16 MED ORDER — METAXALONE 400 MG HALF TABLET: ORAL_TABLET | ORAL | Status: DC

## 2012-09-16 NOTE — Patient Instructions (Addendum)
Please take all new medication as prescribed  - the muscle relaxer If it does not seem to help enough, please do a message on mychart and this can be changed to another Please continue all other medications as before, and refills have been done if requested. Please keep your appointments with your specialists as you have planned  - cardiology, and coumadin clinic Please go to the LAB in the Basement (turn left off the elevator) for the tests to be done today You will be contacted by phone if any changes need to be made immediately.  Otherwise, you will receive a letter about your results with an explanation, but please check with MyChart first. Thank you for enrolling in [facility]. Please follow the instructions below to securely access your online medical record. MyChart allows you to send messages to your doctor, view your test results, renew your prescriptions, schedule appointments, and more. Please return in 6 months, or sooner if needed, with Lab testing done 3-5 days before

## 2012-09-16 NOTE — Progress Notes (Signed)
Subjective:    Patient ID: [name], male    DOB: [birth year], 57 y.o.   MRN: [id number]  HPI  Here to f/u, Here to f/u; overall doing ok,  Pt denies chest pain, increased sob or doe, wheezing, orthopnea, PND, increased LE swelling, palpitations, dizziness or syncope.  Pt denies polydipsia, polyuria, or low sugar symptoms such as weakness or confusion improved with po intake.  Pt denies new neurological symptoms such as new headache, or facial or extremity weakness or numbness.   Pt states overall good compliance with meds, has been trying to follow lower cholesterol, diabetic diet, with wt overall stable,  but little exercise however.  Has seen cardiology feb 2014, due for coumadin f/u today. Does janitorial work - vacuum, mop and buffer most days.  C/o right neck pain, worse to turn head to the right, radiates to the shoulder only, sharp, mild to mod but aggrevating and occurs most days, no activitiy he knows that makes it worse, but is worse after work, stiffening up and pain.  Changed pillows but no change overall, and furtunately doesn't keep him up at night. No hypersomnia, uses CPAP nightly.   Past Medical History  Diagnosis Date  . CHF (congestive heart failure)     a. Likely NICM (out of proportion to CAD). b. 2009 - EF 25-30% by echo. c. s/p St. Jude ICD implantation 2007.  [name] Cerebrovascular accident     a. Basilar CVA 2000.  [name] Gout   . Hypokalemia   . HTN (hypertension)   . Lipoma   . Dyslipidemia   . Acute, but ill-defined, cerebrovascular disease   . CAD (coronary artery disease)     a. Nonobstructive by cath 2009.  [name] Atrial fibrillation     a. Noted 05/2008 by EKG.  . Paroxysmal VT     a. s/p St. Jude ICD 2007. b. H/o paroxysmal VT/VF.  [name] Cardiomyopathy   . Anginal pain   . ICD (implantable cardiac defibrillator) in place   . Shortness of breath     only with CHF  . OSA (obstructive sleep apnea)     USES cpap  . DM (diabetes mellitus)     insulin dependent   Past  Surgical History  Procedure Laterality Date  . Cardiac catheterization      Nonobstructive coronary disease 2009  . Cardiac defibrillator placement      ICD-St. Jude  . Lipoma excision      reports that he quit smoking about 26 years ago. He has never used smokeless tobacco. He reports that  drinks alcohol. He reports that he does not use illicit drugs. family history includes Coronary artery disease in his mother. No Known Allergies Current Outpatient Prescriptions on File Prior to Visit  Medication Sig Dispense Refill  . allopurinol (ZYLOPRIM) 300 MG tablet TAKE 1 TABLET BY MOUTH DAILY  90 tablet  2  . amLODipine (NORVASC) 10 MG tablet Take 1 tablet (10 mg total) by mouth daily.  90 tablet  3  . atorvastatin (LIPITOR) 40 MG tablet Take 1 tablet (40 mg total) by mouth daily.  90 tablet  3  . B-D ULTRAFINE III SHORT PEN 31G X 8 MM MISC USE ONCE DAILY AS DIRECTED  100 each  3  . carvedilol (COREG) 25 MG tablet Take 2 tablets (50 mg total) by mouth 2 (two) times daily with a meal.  360 tablet  3  . furosemide (LASIX) 80 MG tablet Take 1 tablet (80 mg total)  by mouth 2 (two) times daily.  180 tablet  3  . hydrALAZINE (APRESOLINE) 50 MG tablet Take 1 tablet (50 mg total) by mouth every 8 (eight) hours.  270 tablet  3  . insulin glargine (LANTUS SOLOSTAR) 100 UNIT/ML injection Inject 10 Units into the skin at bedtime.      . isosorbide dinitrate (ISORDIL) 20 MG tablet Take 1 tablet (20 mg total) by mouth 3 (three) times daily.  270 tablet  3  . Lancets (ONETOUCH ULTRASOFT) lancets TEST AS DIRECTED  100 each  11  . metFORMIN (GLUCOPHAGE) 500 MG tablet Take 500 mg by mouth 2 (two) times daily.      . Multiple Vitamin (MULTIVITAMIN) capsule Take 1 capsule by mouth daily.       . nitroGLYCERIN (NITROSTAT) 0.4 MG SL tablet Place 1 tablet (0.4 mg total) under the tongue every 5 (five) minutes x 3 doses as needed for chest pain.  25 tablet  4  . ONE TOUCH TEST STRIPS test strip TEST AS DIRECTED  100  each  11  . potassium chloride SA (K-DUR,KLOR-CON) 20 MEQ tablet Take 2 tablets (40 mEq total) by mouth 3 (three) times daily.  540 tablet  3  . ramipril (ALTACE) 10 MG tablet Take 1 tablet (10 mg total) by mouth 2 (two) times daily.  180 tablet  3   No current facility-administered medications on file prior to visit.   Review of Systems  Constitutional: Negative for unexpected weight change, or unusual diaphoresis  HENT: Negative for tinnitus.   Eyes: Negative for photophobia and visual disturbance.  Respiratory: Negative for choking and stridor.   Gastrointestinal: Negative for vomiting and blood in stool.  Genitourinary: Negative for hematuria and decreased urine volume.  Musculoskeletal: Negative for acute joint swelling Skin: Negative for color change and wound.  Neurological: Negative for tremors and numbness other than noted  Psychiatric/Behavioral: Negative for decreased concentration or  hyperactivity.       Objective:   Physical Exam BP 110/70  Pulse 76  Temp(Src) 97.6 F (36.4 C) (Oral)  Wt 267 lb (121.11 kg)  BMI 36.2 kg/m2  SpO2 93% VS noted,  Constitutional: Pt appears well-developed and well-nourished.  HENT: Head: NCAT.  Right Ear: External ear normal.  Left Ear: External ear normal.  Eyes: Conjunctivae and EOM are normal. Pupils are equal, round, and reactive to light.  Neck: Normal range of motion. Neck supple.  Cardiovascular: Normal rate and regular rhythm.   Pulmonary/Chest: Effort normal and breath sounds normal.  Abd:  Soft, NT, non-distended, + BS Neurological: Pt is alert. Not confused  Right trapezoid tender Skin: Skin is warm. No erythema.  Psychiatric: Pt behavior is normal. Thought content normal.     Assessment & Plan:

## 2012-09-17 DIAGNOSIS — T148XXA Other injury of unspecified body region, initial encounter: Secondary | ICD-10-CM | POA: Insufficient documentation

## 2012-09-17 NOTE — Assessment & Plan Note (Signed)
stable overall by history and exam, recent data reviewed with pt, and pt to continue medical treatment as before,  to f/u any worsening symptoms or concerns Lab Results  Component Value Date   HGBA1C 5.5 09/16/2012

## 2012-09-17 NOTE — Assessment & Plan Note (Signed)
stable overall by history and exam, recent data reviewed with pt, and pt to continue medical treatment as before,  to f/u any worsening symptoms or concerns Lab Results  Component Value Date   LDLCALC 85 09/16/2012

## 2012-09-17 NOTE — Assessment & Plan Note (Signed)
For f/u uric acid level

## 2012-09-17 NOTE — Assessment & Plan Note (Signed)
stable overall by history and exam, recent data reviewed with pt, and pt to continue medical treatment as before,  to f/u any worsening symptoms or concerns BP Readings from Last 3 Encounters:  09/16/12 110/70  06/21/12 135/90  05/24/12 117/81

## 2012-09-17 NOTE — Assessment & Plan Note (Signed)
Right trapezoid, for skelaxin prn,  to f/u any worsening symptoms or concerns

## 2012-09-19 ENCOUNTER — Telehealth: Payer: Self-pay | Admitting: Internal Medicine

## 2012-09-19 NOTE — Telephone Encounter (Signed)
Patient can not afford the muscle relaxer that was called in for him and needs an alternative medication called in

## 2012-09-20 MED ORDER — TIZANIDINE HCL 4 MG PO TABS: 4.0000 mg | ORAL_TABLET | Freq: Four times a day (QID) | ORAL | Status: DC | PRN

## 2012-09-20 NOTE — Telephone Encounter (Signed)
Patient informed. 

## 2012-09-20 NOTE — Telephone Encounter (Signed)
Ok to try change to zanaflex prn - done erx 

## 2012-10-14 ENCOUNTER — Ambulatory Visit (INDEPENDENT_AMBULATORY_CARE_PROVIDER_SITE_OTHER): Payer: Medicare Other | Admitting: Family Medicine

## 2012-10-14 DIAGNOSIS — Z8679 Personal history of other diseases of the circulatory system: Secondary | ICD-10-CM

## 2012-10-14 DIAGNOSIS — I4891 Unspecified atrial fibrillation: Secondary | ICD-10-CM

## 2012-10-14 DIAGNOSIS — Z7901 Long term (current) use of anticoagulants: Secondary | ICD-10-CM

## 2012-10-14 LAB — POCT INR: INR: 4

## 2012-10-16 ENCOUNTER — Other Ambulatory Visit: Payer: Self-pay | Admitting: Internal Medicine

## 2012-10-18 ENCOUNTER — Other Ambulatory Visit: Payer: Self-pay | Admitting: Internal Medicine

## 2012-10-19 NOTE — Progress Notes (Signed)
ELECTROPHYSIOLOGY OFFICE NOTE  Patient ID: [name] MRN: [id number], DOB/AGE: 1955-11-15   Date of Visit: 10/20/2012  Primary Physician: Cathlean Cower, MD Primary Cardiologist / EP: Percival Spanish, MD / Lovena Le, MD Reason for Visit: EP/device follow-up  History of Present Illness  [name] is a 57 year old man with a NICM s/p ICD implant, paroxysmal VT, chronic systolic HF and PAF who presents today for routine electrophysiology followup. Since last being seen in our clinic, he reports he is doing well and has no new cardiac complaints. He has chronic intermittent CP that is managed medically currently. Dr. Percival Spanish has been following. He states this is not worsening. He also has chronic DOE which is not worse than his baseline. He reports intermittent orthopnea but none recently. He denies palpitations, dizziness, near syncope or syncope. He denies LE swelling, PND or recent weight gain. [name] states that he is compliant and tolerating medications without difficulty.  Past Medical History Past Medical History  Diagnosis Date  . CHF (congestive heart failure)     a. Likely NICM (out of proportion to CAD). b. 2009 - EF 25-30% by echo. c. s/p St. Jude ICD implantation 2007.  [name] Cerebrovascular accident     a. Basilar CVA 2000.  [name] Gout   . Hypokalemia   . HTN (hypertension)   . Lipoma   . Dyslipidemia   . Acute, but ill-defined, cerebrovascular disease   . CAD (coronary artery disease)     a. Nonobstructive by cath 2009.  [name] Atrial fibrillation     a. Noted 05/2008 by EKG.  . Paroxysmal VT     a. s/p St. Jude ICD 2007. b. H/o paroxysmal VT/VF.  [name] Cardiomyopathy   . Anginal pain   . ICD (implantable cardiac defibrillator) in place   . Shortness of breath     only with CHF  . OSA (obstructive sleep apnea)     USES cpap  . DM (diabetes mellitus)     insulin dependent    Past Surgical History Past Surgical History  Procedure Laterality Date  . Cardiac catheterization     Nonobstructive coronary disease 2009  . Cardiac defibrillator placement      ICD-St. Jude  . Lipoma excision      Allergies/Intolerances No Known Allergies  Current Home Medications Current Outpatient Prescriptions  Medication Sig Dispense Refill  . allopurinol (ZYLOPRIM) 300 MG tablet TAKE 1 TABLET BY MOUTH DAILY  90 tablet  2  . amLODipine (NORVASC) 10 MG tablet Take 1 tablet (10 mg total) by mouth daily.  90 tablet  3  . atorvastatin (LIPITOR) 40 MG tablet Take 1 tablet (40 mg total) by mouth daily.  90 tablet  3  . B-D ULTRAFINE III SHORT PEN 31G X 8 MM MISC USE ONCE DAILY AS DIRECTED  100 each  3  . carvedilol (COREG) 25 MG tablet Take 2 tablets (50 mg total) by mouth 2 (two) times daily with a meal.  360 tablet  3  . furosemide (LASIX) 80 MG tablet Take 1 tablet (80 mg total) by mouth 2 (two) times daily.  180 tablet  3  . hydrALAZINE (APRESOLINE) 50 MG tablet Take 1 tablet (50 mg total) by mouth every 8 (eight) hours.  270 tablet  3  . insulin glargine (LANTUS SOLOSTAR) 100 UNIT/ML injection Inject 10 Units into the skin at bedtime.      . isosorbide dinitrate (ISORDIL) 20 MG tablet Take 1 tablet (20 mg total) by  mouth 3 (three) times daily.  270 tablet  3  . Lancets (ONETOUCH ULTRASOFT) lancets TEST AS DIRECTED  100 each  11  . metFORMIN (GLUCOPHAGE) 500 MG tablet TAKE 1 TABLET BY MOUTH TWICE DAILY  180 tablet  3  . Multiple Vitamin (MULTIVITAMIN) capsule Take 1 capsule by mouth daily.       . nitroGLYCERIN (NITROSTAT) 0.4 MG SL tablet Place 1 tablet (0.4 mg total) under the tongue every 5 (five) minutes x 3 doses as needed for chest pain.  25 tablet  4  . ONE TOUCH TEST STRIPS test strip TEST AS DIRECTED  100 each  11  . potassium chloride SA (K-DUR,KLOR-CON) 20 MEQ tablet Take 2 tablets (40 mEq total) by mouth 3 (three) times daily.  540 tablet  3  . ramipril (ALTACE) 10 MG tablet Take 1 tablet (10 mg total) by mouth 2 (two) times daily.  180 tablet  3  . tiZANidine (ZANAFLEX) 4  MG tablet TAKE 1 TABLET BY MOUTH EVERY 6 HOURS AS NEEDED  60 tablet  1  . warfarin (COUMADIN) 5 MG tablet Take as directed by [facility].  270 tablet  1   No current facility-administered medications for this visit.   Social History History   Social History  . Marital Status: Married    Spouse Name: N/A    Number of Children: N/A  . Years of Education: N/A   Occupational History  . Not on file.   Social History Main Topics  . Smoking status: Former Smoker    Quit date: 05/04/1986  . Smokeless tobacco: Never Used     Comment: Quit smoking 30 yrs ago. Smoked as teenager less than 1/2 ppd. Smoked x 4 years.  . Alcohol Use: Yes     Comment: quit 11/2011  . Drug Use: No  . Sexually Active: Yes   Other Topics Concern  . Not on file   Social History Narrative  . No narrative on file    Review of Systems General: No chills, fever, night sweats or weight changes Cardiovascular: No chest pain, dyspnea on exertion, edema, orthopnea, palpitations, paroxysmal nocturnal dyspnea Dermatological: No rash, lesions or masses Respiratory: No cough, dyspnea Urologic: No hematuria, dysuria Abdominal: No nausea, vomiting, diarrhea, bright red blood per rectum, melena, or hematemesis Neurologic: No visual changes, weakness, changes in mental status All other systems reviewed and are otherwise negative except as noted above.  Physical Exam Blood pressure 108/72, pulse 80, height 6' (1.829 m), weight 264 lb (119.75 kg).  General: Well developed, well appearing 57 year old male in no acute distress. HEENT: Normocephalic, atraumatic. EOMs intact. Sclera nonicteric. Oropharynx clear.  Neck: Supple without bruits. No JVD. Lungs: Respirations regular and unlabored, CTA bilaterally. No wheezes, rales or rhonchi. Heart: RRR. S1, S2 present. No murmurs, rub, S3 or S4. Abdomen: Soft, non-distended. Extremities: No clubbing, cyanosis or edema. PT/Radials 2+ and equal bilaterally. Psych: Normal  affect. Neuro: Alert and oriented X 3. Moves all extremities spontaneously.   Diagnostics Device interrogation today - Normal device function. Thresholds and sensing consistent with previous device measurements. Impedance trends stable over time. No evidence of any ventricular arrhythmias. Histogram distribution appropriate for patient and level of activity. No changes made this session. Device programmed at appropriate safety margins. Battery voltage 2.55 V since 05-21-11. ROV in 3 months w/device clinic.   Assessment and Plan 1. Nonischemic CM s/p ICD implant Normal device function No arrhythmias No programming changes made Return for routine ICD check  in device clinic in 3 months Follow-up with me or Dr. Lovena Le in 6 months 2. Paroxysmal VT Stable; no arrhythmias on device interrogation today Continue BB 3. PAF Stable Continue BB for rate control and warfarin for embolic prophylaxis 4. Chronic systolic HF Stable; euvolemic by exam today Continue medical therapy 4. CAD with chronic chest pain Stable chest pain pattern.  Dr. Percival Spanish aware and following.  At his last visit, he did not recommend any change to his medical regimen. His CP is unchanged and I will not make any changes today. He will let us know if he has any increasing symptoms at which point he may need further testing/evaluation. He has had nonobstructive disease in the past.  Signed, Ileene Hutchinson, PA-C 10/20/2012, 6:26 PM

## 2012-10-20 ENCOUNTER — Ambulatory Visit (INDEPENDENT_AMBULATORY_CARE_PROVIDER_SITE_OTHER): Payer: Medicare Other | Admitting: Cardiology

## 2012-10-20 ENCOUNTER — Encounter: Payer: Self-pay | Admitting: Cardiology

## 2012-10-20 VITALS — BP 108/72 | HR 80 | Ht 72.0 in | Wt 264.0 lb

## 2012-10-20 DIAGNOSIS — Z95810 Presence of automatic (implantable) cardiac defibrillator: Secondary | ICD-10-CM

## 2012-10-20 DIAGNOSIS — I428 Other cardiomyopathies: Secondary | ICD-10-CM

## 2012-10-20 DIAGNOSIS — I480 Paroxysmal atrial fibrillation: Secondary | ICD-10-CM

## 2012-10-20 DIAGNOSIS — I2510 Atherosclerotic heart disease of native coronary artery without angina pectoris: Secondary | ICD-10-CM

## 2012-10-20 DIAGNOSIS — I5022 Chronic systolic (congestive) heart failure: Secondary | ICD-10-CM

## 2012-10-20 DIAGNOSIS — I4891 Unspecified atrial fibrillation: Secondary | ICD-10-CM

## 2012-10-20 DIAGNOSIS — I472 Ventricular tachycardia: Secondary | ICD-10-CM

## 2012-10-20 LAB — ICD DEVICE OBSERVATION
BATTERY VOLTAGE: 2.55 V
DEV-0020ICD: NEGATIVE
PACEART VT: 0
TZAT-0013FASTVT: 2
TZAT-0013SLOWVT: 4
TZAT-0018FASTVT: NEGATIVE
TZAT-0018SLOWVT: NEGATIVE
TZAT-0020FASTVT: 1 ms
TZAT-0020SLOWVT: 1 ms
TZON-0003FASTVT: 280 ms
TZON-0004FASTVT: 12
TZON-0005FASTVT: 6
TZON-0005SLOWVT: 6
TZON-0008FASTVT: 80 ms
TZON-0008SLOWVT: 80 ms
TZON-0010SLOWVT: 80 ms
TZST-0001FASTVT: 2
TZST-0001FASTVT: 3
TZST-0001SLOWVT: 2
TZST-0001SLOWVT: 4
TZST-0003FASTVT: 36 J
TZST-0003SLOWVT: 25 J
VENTRICULAR PACING ICD: 0 pct
VF: 0

## 2012-10-20 NOTE — Patient Instructions (Addendum)
Your physician recommends that you continue on your current medications as directed. Please refer to the Current Medication list given to you today.  Your physician recommends that you schedule a follow-up appointment in:   [place] 01-23-2013  KEEP FOLLOW UP APPOINTMENT WITH DR. [facility] 01-04-2013  Your physician wants you to follow-up in: 6 Months with Dr. Knox Saliva will receive a reminder letter in the mail two months in advance. If you don't receive a letter, please call our office to schedule the follow-up appointment.

## 2012-10-28 ENCOUNTER — Ambulatory Visit (INDEPENDENT_AMBULATORY_CARE_PROVIDER_SITE_OTHER): Payer: Medicare Other | Admitting: General Practice

## 2012-10-28 DIAGNOSIS — Z8679 Personal history of other diseases of the circulatory system: Secondary | ICD-10-CM

## 2012-10-28 DIAGNOSIS — I4891 Unspecified atrial fibrillation: Secondary | ICD-10-CM

## 2012-10-28 DIAGNOSIS — Z7901 Long term (current) use of anticoagulants: Secondary | ICD-10-CM

## 2012-10-28 LAB — POCT INR: INR: 2.6

## 2012-11-09 ENCOUNTER — Ambulatory Visit (INDEPENDENT_AMBULATORY_CARE_PROVIDER_SITE_OTHER): Payer: Medicare Other | Admitting: *Deleted

## 2012-11-09 ENCOUNTER — Telehealth: Payer: Self-pay | Admitting: Internal Medicine

## 2012-11-09 DIAGNOSIS — I4891 Unspecified atrial fibrillation: Secondary | ICD-10-CM

## 2012-11-09 DIAGNOSIS — I472 Ventricular tachycardia: Secondary | ICD-10-CM

## 2012-11-09 LAB — ICD DEVICE OBSERVATION
BATTERY VOLTAGE: 2.55 V
DEVICE MODEL ICD: 311254
RV LEAD THRESHOLD: 1.25 V
TOT-0006: 20140709000000
TOT-0007: 2
TOT-0008: 0
TZAT-0001SLOWVT: 1
TZAT-0004FASTVT: 8
TZAT-0018FASTVT: NEGATIVE
TZAT-0020SLOWVT: 1 ms
TZON-0003FASTVT: 280 ms
TZON-0005SLOWVT: 6
TZON-0008SLOWVT: 80 ms
TZON-0010FASTVT: 80 ms
TZON-0010SLOWVT: 80 ms
TZST-0001FASTVT: 3
TZST-0001FASTVT: 5
TZST-0001SLOWVT: 2
TZST-0001SLOWVT: 4
TZST-0003FASTVT: 36 J
TZST-0003SLOWVT: 15 J
VENTRICULAR PACING ICD: 1 pct
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
## 2012-11-09 NOTE — Telephone Encounter (Signed)
Spoke w/pt in regards to ICD shock on 11-08-12. Pt does not do remote transmission therefore pt was scheduled for in office device check on 11-09-12 @ 1030. Pt aware of appointment.

## 2012-11-09 NOTE — Telephone Encounter (Signed)
New Problem:    Patient called in

## 2012-11-15 ENCOUNTER — Telehealth: Payer: Self-pay | Admitting: Cardiology

## 2012-11-15 MED ORDER — NITROGLYCERIN 0.4 MG SL SUBL: 0.4000 mg | SUBLINGUAL_TABLET | SUBLINGUAL | Status: DC | PRN

## 2012-11-15 NOTE — Telephone Encounter (Signed)
New problem   Pt switch pharmacy and need new prescription for nitro to [facility] [address].

## 2012-11-15 NOTE — Telephone Encounter (Signed)
Rx sent into pharmacy as requested.  Pt is aware. Patient states defib went last Tuesday and he was seen in device clinic.  He is seeing Dr Lovena Le tomorrow.

## 2012-11-16 ENCOUNTER — Ambulatory Visit (INDEPENDENT_AMBULATORY_CARE_PROVIDER_SITE_OTHER): Payer: Medicare Other | Admitting: Internal Medicine

## 2012-11-16 ENCOUNTER — Encounter: Payer: Self-pay | Admitting: Internal Medicine

## 2012-11-16 VITALS — BP 113/80 | HR 86 | Wt 256.0 lb

## 2012-11-16 DIAGNOSIS — Z95810 Presence of automatic (implantable) cardiac defibrillator: Secondary | ICD-10-CM

## 2012-11-16 DIAGNOSIS — I472 Ventricular tachycardia, unspecified: Secondary | ICD-10-CM | POA: Insufficient documentation

## 2012-11-16 LAB — ICD DEVICE OBSERVATION
DEV-0020ICD: NEGATIVE
DEVICE MODEL ICD: 311254

## 2012-11-16 NOTE — Assessment & Plan Note (Signed)
The patient had recurrent ventricular tachycardia, which quickly degenerated into ventricular fibrillation. He received an appropriate ICD shock. He has been instructed not to drive for 6 months. He will continue his current medical therapy, but if he has a recurrent episode in the next few months, I would consider adding amiodarone or another antiarrhythmic drug to his treatment regimen.
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
## 2012-11-16 NOTE — Patient Instructions (Addendum)
Your physician wants you to follow-up in: 3 months in the device clinic and 6 months with Dr Knox Saliva will receive a reminder letter in the mail two months in advance. If you don't receive a letter, please call our office to schedule the follow-up appointment.   No Driving

## 2012-11-16 NOTE — Assessment & Plan Note (Signed)
His St. Jude device is working normally. His battery is nearing elective replacement but not there yet. We'll plan to recheck in several months.

## 2012-11-18 ENCOUNTER — Encounter: Payer: Self-pay | Admitting: Internal Medicine

## 2012-11-25 ENCOUNTER — Ambulatory Visit (INDEPENDENT_AMBULATORY_CARE_PROVIDER_SITE_OTHER): Payer: Medicare Other | Admitting: General Practice

## 2012-11-25 DIAGNOSIS — I4891 Unspecified atrial fibrillation: Secondary | ICD-10-CM

## 2012-11-25 DIAGNOSIS — Z8679 Personal history of other diseases of the circulatory system: Secondary | ICD-10-CM

## 2012-11-25 DIAGNOSIS — Z7901 Long term (current) use of anticoagulants: Secondary | ICD-10-CM

## 2012-11-25 LAB — POCT INR: INR: 3.5

## 2012-12-23 ENCOUNTER — Ambulatory Visit (INDEPENDENT_AMBULATORY_CARE_PROVIDER_SITE_OTHER): Payer: Medicare Other | Admitting: General Practice

## 2012-12-23 DIAGNOSIS — Z8679 Personal history of other diseases of the circulatory system: Secondary | ICD-10-CM

## 2012-12-23 DIAGNOSIS — Z7901 Long term (current) use of anticoagulants: Secondary | ICD-10-CM

## 2012-12-23 DIAGNOSIS — I4891 Unspecified atrial fibrillation: Secondary | ICD-10-CM

## 2012-12-29 ENCOUNTER — Emergency Department (HOSPITAL_COMMUNITY): Payer: Medicare Other

## 2012-12-29 ENCOUNTER — Encounter (HOSPITAL_COMMUNITY): Payer: Self-pay | Admitting: Emergency Medicine

## 2012-12-29 ENCOUNTER — Inpatient Hospital Stay (HOSPITAL_COMMUNITY)
Admission: EM | Admit: 2012-12-29 | Discharge: 2013-01-05 | DRG: 286 | Disposition: A | Discharge status: Home / Self Care | Payer: Medicare Other | Attending: Cardiology | Admitting: Cardiology

## 2012-12-29 DIAGNOSIS — E876 Hypokalemia: Secondary | ICD-10-CM

## 2012-12-29 DIAGNOSIS — E785 Hyperlipidemia, unspecified: Secondary | ICD-10-CM | POA: Diagnosis present

## 2012-12-29 DIAGNOSIS — D179 Benign lipomatous neoplasm, unspecified: Secondary | ICD-10-CM | POA: Diagnosis present

## 2012-12-29 DIAGNOSIS — Z794 Long term (current) use of insulin: Secondary | ICD-10-CM

## 2012-12-29 DIAGNOSIS — Z87891 Personal history of nicotine dependence: Secondary | ICD-10-CM

## 2012-12-29 DIAGNOSIS — I472 Ventricular tachycardia, unspecified: Secondary | ICD-10-CM

## 2012-12-29 DIAGNOSIS — I2789 Other specified pulmonary heart diseases: Secondary | ICD-10-CM | POA: Diagnosis present

## 2012-12-29 DIAGNOSIS — I4891 Unspecified atrial fibrillation: Secondary | ICD-10-CM

## 2012-12-29 DIAGNOSIS — Z8673 Personal history of transient ischemic attack (TIA), and cerebral infarction without residual deficits: Secondary | ICD-10-CM

## 2012-12-29 DIAGNOSIS — I480 Paroxysmal atrial fibrillation: Secondary | ICD-10-CM

## 2012-12-29 DIAGNOSIS — I10 Essential (primary) hypertension: Secondary | ICD-10-CM | POA: Diagnosis present

## 2012-12-29 DIAGNOSIS — Z0389 Encounter for observation for other suspected diseases and conditions ruled out: Secondary | ICD-10-CM

## 2012-12-29 DIAGNOSIS — I428 Other cardiomyopathies: Secondary | ICD-10-CM

## 2012-12-29 DIAGNOSIS — I2510 Atherosclerotic heart disease of native coronary artery without angina pectoris: Secondary | ICD-10-CM

## 2012-12-29 DIAGNOSIS — M109 Gout, unspecified: Secondary | ICD-10-CM | POA: Diagnosis present

## 2012-12-29 DIAGNOSIS — I4729 Other ventricular tachycardia: Secondary | ICD-10-CM | POA: Diagnosis present

## 2012-12-29 DIAGNOSIS — E119 Type 2 diabetes mellitus without complications: Secondary | ICD-10-CM | POA: Diagnosis present

## 2012-12-29 DIAGNOSIS — I509 Heart failure, unspecified: Secondary | ICD-10-CM | POA: Diagnosis present

## 2012-12-29 DIAGNOSIS — I119 Hypertensive heart disease without heart failure: Secondary | ICD-10-CM | POA: Diagnosis present

## 2012-12-29 DIAGNOSIS — R748 Abnormal levels of other serum enzymes: Secondary | ICD-10-CM | POA: Diagnosis present

## 2012-12-29 DIAGNOSIS — G4733 Obstructive sleep apnea (adult) (pediatric): Secondary | ICD-10-CM | POA: Diagnosis present

## 2012-12-29 DIAGNOSIS — I214 Non-ST elevation (NSTEMI) myocardial infarction: Secondary | ICD-10-CM

## 2012-12-29 DIAGNOSIS — Z4502 Encounter for adjustment and management of automatic implantable cardiac defibrillator: Secondary | ICD-10-CM

## 2012-12-29 DIAGNOSIS — Z95810 Presence of automatic (implantable) cardiac defibrillator: Secondary | ICD-10-CM

## 2012-12-29 DIAGNOSIS — E669 Obesity, unspecified: Secondary | ICD-10-CM | POA: Diagnosis present

## 2012-12-29 DIAGNOSIS — Z7901 Long term (current) use of anticoagulants: Secondary | ICD-10-CM

## 2012-12-29 DIAGNOSIS — I5023 Acute on chronic systolic (congestive) heart failure: Principal | ICD-10-CM

## 2012-12-29 DIAGNOSIS — I4901 Ventricular fibrillation: Secondary | ICD-10-CM

## 2012-12-29 HISTORY — DX: Pulmonary hypertension, unspecified: I27.20

## 2012-12-29 HISTORY — DX: Other cardiomyopathies: I42.8

## 2012-12-29 HISTORY — DX: Chronic systolic (congestive) heart failure: I50.22

## 2012-12-29 LAB — URINALYSIS, ROUTINE W REFLEX MICROSCOPIC
Glucose, UA: NEGATIVE mg/dL
Hgb urine dipstick: NEGATIVE
Leukocytes, UA: NEGATIVE
Protein, ur: 30 mg/dL — AB
pH: 6 (ref 5.0–8.0)

## 2012-12-29 LAB — BASIC METABOLIC PANEL
BUN: 15 mg/dL (ref 6–23)
CO2: 24 mEq/L (ref 19–32)
Calcium: 8.7 mg/dL (ref 8.4–10.5)
Chloride: 108 mEq/L (ref 96–112)
Creatinine, Ser: 0.96 mg/dL (ref 0.50–1.35)
GFR calc Af Amer: >90 mL/min (ref >90)

## 2012-12-29 LAB — CBC
HCT: 39.2 % (ref 39.0–52.0)
MCH: 30.7 pg (ref 26.0–34.0)
MCV: 94 fL (ref 78.0–100.0)
Platelets: 190 10*3/uL (ref 150–400)
RDW: 13.7 % (ref 11.5–15.5)

## 2012-12-29 LAB — URINE MICROSCOPIC-ADD ON

## 2012-12-29 LAB — POCT I-STAT TROPONIN I: Troponin i, poc: 0.3 ng/mL (ref 0.00–0.08)

## 2012-12-29 LAB — PROTIME-INR: Prothrombin Time: 20.7 seconds — ABNORMAL HIGH (ref 11.6–15.2)

## 2012-12-29 MED ORDER — NITROGLYCERIN 0.4 MG SL SUBL
0.4000 mg | SUBLINGUAL_TABLET | SUBLINGUAL | Status: DC | PRN
  Administered 2012-12-29 (×2): 0.4 mg via SUBLINGUAL
  Filled 2012-12-29: qty 25

## 2012-12-29 MED ORDER — WARFARIN - PHARMACIST DOSING INPATIENT
Freq: Every day | Status: DC
  Administered 2012-12-30: 19:00:00

## 2012-12-29 MED ORDER — ALPRAZOLAM 0.25 MG PO TABS
0.2500 mg | ORAL_TABLET | Freq: Two times a day (BID) | ORAL | Status: DC | PRN
  Administered 2012-12-29: 0.25 mg via ORAL
  Filled 2012-12-29: qty 1

## 2012-12-29 NOTE — ED Notes (Signed)
I stat resulted and shown to Dr. Kathrynn Humble. 0.30

## 2012-12-29 NOTE — ED Notes (Signed)
Cardiology at bedside to see pt.

## 2012-12-29 NOTE — ED Provider Notes (Addendum)
CSN: [id number]     Arrival date & time 12/29/12  1656 History   First MD Initiated Contact with Patient 12/29/12 1714     Chief Complaint  Patient presents with  . Pacemaker Problem   (Consider location/radiation/quality/duration/timing/severity/associated sxs/prior Treatment) HPI Comments: Pt with hx of advanced CHF, ventricular dysrhythmias with pacemaker and defibrillator comes in with cc of shock. Pt reports that this afternoon, while he was sitting down, he felt a shock to the left side of his chest. He has had 3 more shock discharges after that. There is no chest pain, dib, palpitations, dizziness, syncope - he denies any recent infection, med change, substance abuse, non compliance. Pt has had a shock one mire time in the past. Records indicate device interrogated rececently, and was normal   The history is provided by the patient and medical records.    Past Medical History  Diagnosis Date  . CHF (congestive heart failure)     a. Likely NICM (out of proportion to CAD). b. 2009 - EF 25-30% by echo. c. s/p St. Jude ICD implantation 2007.  [name] Gout   . Hypokalemia   . HTN (hypertension)   . Lipoma   . Dyslipidemia   . Acute, but ill-defined, cerebrovascular disease   . CAD (coronary artery disease)     a. Nonobstructive by cath 2009.  [name] Atrial fibrillation     a. Noted 05/2008 by EKG.  . Paroxysmal VT     a. s/p St. Jude ICD 2007. b. H/o paroxysmal VT/VF.  [name] Cardiomyopathy   . Anginal pain   . ICD (implantable cardiac defibrillator) in place   . Shortness of breath     only with CHF  . OSA (obstructive sleep apnea)     USES cpap  . DM (diabetes mellitus)     insulin dependent  . Cerebrovascular accident     a. Basilar CVA 2000. denies deficits   Past Surgical History  Procedure Laterality Date  . Cardiac catheterization      Nonobstructive coronary disease 2009  . Cardiac defibrillator placement      ICD-St. Jude  . Lipoma excision     Family History  Problem  Relation Age of Onset  . Coronary artery disease Mother    History  Substance Use Topics  . Smoking status: Former Smoker    Quit date: 05/04/1986  . Smokeless tobacco: Never Used     Comment: Quit smoking 30 yrs ago. Smoked as teenager less than 1/2 ppd. Smoked x 4 years.  . Alcohol Use: Yes     Comment: occasionally    Review of Systems  Constitutional: Negative for fever, chills, activity change, appetite change and fatigue.  Respiratory: Negative for cough and shortness of breath.   Cardiovascular: Negative for chest pain.  Gastrointestinal: Negative for abdominal pain.  Genitourinary: Negative for dysuria.    Allergies  Review of patient's allergies indicates no known allergies.  Home Medications   Current Outpatient Rx  Name  Route  Sig  Dispense  Refill  . allopurinol (ZYLOPRIM) 300 MG tablet   Oral   Take 300 mg by mouth daily.         [name] amLODipine (NORVASC) 10 MG tablet   Oral   Take 1 tablet (10 mg total) by mouth daily.   90 tablet   3   . atorvastatin (LIPITOR) 40 MG tablet   Oral   Take 1 tablet (40 mg total) by mouth daily.   90 tablet  3   . B-D ULTRAFINE III SHORT PEN 31G X 8 MM MISC      USE ONCE DAILY AS DIRECTED   100 each   3   . carvedilol (COREG) 25 MG tablet   Oral   Take 2 tablets (50 mg total) by mouth 2 (two) times daily with a meal.   360 tablet   3   . furosemide (LASIX) 80 MG tablet   Oral   Take 1 tablet (80 mg total) by mouth 2 (two) times daily.   180 tablet   3   . hydrALAZINE (APRESOLINE) 50 MG tablet   Oral   Take 1 tablet (50 mg total) by mouth every 8 (eight) hours.   270 tablet   3   . insulin glargine (LANTUS SOLOSTAR) 100 UNIT/ML injection   Subcutaneous   Inject 10 Units into the skin at bedtime.         . isosorbide dinitrate (ISORDIL) 20 MG tablet   Oral   Take 1 tablet (20 mg total) by mouth 3 (three) times daily.   270 tablet   3   . Lancets (ONETOUCH ULTRASOFT) lancets      TEST AS  DIRECTED   100 each   11   . metFORMIN (GLUCOPHAGE) 500 MG tablet   Oral   Take 500 mg by mouth 2 (two) times daily.         . Multiple Vitamin (MULTIVITAMIN) capsule   Oral   Take 1 capsule by mouth daily.          . nitroGLYCERIN (NITROSTAT) 0.4 MG SL tablet   Sublingual   Place 1 tablet (0.4 mg total) under the tongue every 5 (five) minutes x 3 doses as needed for chest pain.   25 tablet   11   . ONE TOUCH TEST STRIPS test strip      TEST AS DIRECTED   100 each   11   . potassium chloride SA (K-DUR,KLOR-CON) 20 MEQ tablet   Oral   Take 2 tablets (40 mEq total) by mouth 3 (three) times daily.   540 tablet   3   . ramipril (ALTACE) 10 MG tablet   Oral   Take 1 tablet (10 mg total) by mouth 2 (two) times daily.   180 tablet   3   . tiZANidine (ZANAFLEX) 4 MG tablet   Oral   Take 4 mg by mouth every 6 (six) hours as needed.         . warfarin (COUMADIN) 5 MG tablet   Oral   Take 10-15 mg by mouth daily. Mon, Wed, Fri - take 15mg  Tue, Thur, Sat, Sun - take 10mg           BP 131/83  Pulse 104  Temp(Src) 97.9 F (36.6 C)  Resp 19  SpO2 97% Physical Exam  Nursing note and vitals reviewed. Constitutional: He is oriented to person, place, and time. He appears well-developed.  HENT:  Head: Normocephalic and atraumatic.  Eyes: Conjunctivae and EOM are normal. Pupils are equal, round, and reactive to light.  Neck: Normal range of motion. Neck supple.  Cardiovascular: Normal rate.   Murmur heard. Pulmonary/Chest: Effort normal and breath sounds normal.  Abdominal: Soft. Bowel sounds are normal. He exhibits no distension. There is no tenderness. There is no rebound and no guarding.  Musculoskeletal: He exhibits no edema and no tenderness.  Neurological: He is alert and oriented to person, place, and time.  Skin: Skin is  warm.    ED Course  Procedures (including critical care time) Labs Review Labs Reviewed  CBC  BASIC METABOLIC PANEL  PROTIME-INR   PRO B NATRIURETIC PEPTIDE  MAGNESIUM  URINALYSIS, ROUTINE W REFLEX MICROSCOPIC   Imaging Review Dg Chest 2 View  12/29/2012   *RADIOLOGY REPORT*  Clinical Data: Pacemaker discharge x 3 today  CHEST - 2 VIEW  Comparison: 05/24/2012  Findings: Left subclavian AICD with lead tip projecting over right ventricle. Enlargement of cardiac silhouette with pulmonary vascular congestion. Slight chronic accentuation of right perihilar markings. No acute infiltrate, pleural effusion or pneumothorax. Bones unremarkable.  IMPRESSION: Enlargement of cardiac silhouette with pulmonary vascular congestion post AICD. No definite acute infiltrate.   Original Report Authenticated By: Lavonia Dana, M.D.    MDM  No diagnosis found.   Date: 12/29/2012  Rate: 90  Rhythm: normal sinus rhythm and premature ventricular contractions (PVC)  QRS Axis: left  Intervals: PR prolonged  ST/T Wave abnormalities: nonspecific ST/T changes  Conduction Disutrbances:none  Narrative Interpretation:   Old EKG Reviewed: unchanged  Pt comes in with cc of aicd firing. No provoking factor per hx or exam. We will interrogate the device now. We will call Cardiologist. Basic labs have been ordered.     Varney Biles, MD 12/29/12 1835  8:49 PM Pt's device interrogation led to discovery that he had several bouts of vfib, and got shocked 12 times. Cards to admit, no antidysrhythmic agents for now.   Varney Biles, MD 12/29/12 2051

## 2012-12-29 NOTE — Progress Notes (Signed)
  Amiodarone Drug - Drug Interaction Consult Note  Recommendations: Pharmacy is managing pt's coumadin-- will monitor INR closely for drug-drug intxns with amiodarone.  Amiodarone is metabolized by the cytochrome P450 system and therefore has the potential to cause many drug interactions. Amiodarone has an average plasma half-life of 50 days (range 20 to 100 days).   There is potential for drug interactions to occur several weeks or months after stopping treatment and the onset of drug interactions may be slow after initiating amiodarone.   []  Statins: Increased risk of myopathy. Simvastatin- restrict dose to 20mg  daily. Other statins: counsel patients to report any muscle pain or weakness immediately.  [x]  Anticoagulants: Amiodarone can increase anticoagulant effect. Consider warfarin dose reduction. Patients should be monitored closely and the dose of anticoagulant altered accordingly, remembering that amiodarone levels take several weeks to stabilize.  []  Antiepileptics: Amiodarone can increase plasma concentration of phenytoin, the dose should be reduced. Note that small changes in phenytoin dose can result in large changes in levels. Monitor patient and counsel on signs of toxicity.  []  Beta blockers: increased risk of bradycardia, AV block and myocardial depression. Sotalol - avoid concomitant use.  []   Calcium channel blockers (diltiazem and verapamil): increased risk of bradycardia, AV block and myocardial depression.  []   Cyclosporine: Amiodarone increases levels of cyclosporine. Reduced dose of cyclosporine is recommended.  []  Digoxin dose should be halved when amiodarone is started.  [x]  Diuretics: increased risk of cardiotoxicity if hypokalemia occurs.  []  Oral hypoglycemic agents (glyburide, glipizide, glimepiride): increased risk of hypoglycemia. Patient's glucose levels should be monitored closely when initiating amiodarone therapy.   []  Drugs that prolong the QT interval:   Torsades de pointes risk may be increased with concurrent use - avoid if possible.  Monitor QTc, also keep magnesium/potassium WNL if concurrent therapy can't be avoided. [name] Antibiotics: e.g. fluoroquinolones, erythromycin. . Antiarrhythmics: e.g. quinidine, procainamide, disopyramide, sotalol. . Antipsychotics: e.g. phenothiazines, haloperidol.  . Lithium, tricyclic antidepressants, and methadone. Thank You,  Lynelle Doctor  12/29/2012 10:16 PM

## 2012-12-29 NOTE — ED Notes (Signed)
Pt sts now that he is chest pain free.  Spoke with step down rn to tell the same. Cardiology paged regarding this.

## 2012-12-29 NOTE — ED Notes (Signed)
Per EMS - pt coming from friends house. Pt was walking outside today, felt dehydrated. EMS reports seeing PVCs on monitor. Pt sts he felt his pacemaker fire X 3 today. 1st time he was sitting down, it fired then he took a nitro. Then it fired again twice. Denies CP entire time. 12 lead showing PVCs. Pt took 324 ASA prior to EMS arrival. Pt sts he has had this happen before. initially very frequent PVCs gave some fluid, pvcs started to slow now. BP 136/98 HR 74

## 2012-12-29 NOTE — H&P (Signed)
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 Patient last seen by Dr. Lovena Le in July of 2014 following an ICD shock. At that time it was felt that if he had recurrent episodes he would need an antiarrhythmic such as amiodarone. The patient denies recent dyspnea on exertion, orthopnea, chest pain, palpitations or syncope. He has had mild increased pedal edema. Today while talking to a friend he had 3 separate ICD discharges by his report. He was brought to the emergency room where he feels weak but otherwise is without complaints. His device was apparently interrogated and showed 12 ICD discharges for ventricular fibrillation. I do not have those reports available.   (Not in a hospital admission)  No Known Allergies  Past Medical History  Diagnosis Date  . CHF (congestive heart failure)     a. Likely NICM (out of proportion to CAD). b. 2009 - EF 25-30% by echo. c. s/p St. Jude ICD implantation 2007.  [name] Gout   . Hypokalemia   . HTN (hypertension)   . Lipoma   . Dyslipidemia   . CAD (coronary artery disease)     a. Nonobstructive by cath 2009.  [name] Atrial fibrillation     a. Noted 05/2008 by EKG.  . Paroxysmal VT     a. s/p St. Jude ICD 2007. b. H/o paroxysmal VT/VF.  [name] Cardiomyopathy   . ICD (implantable cardiac defibrillator) in place   . OSA (obstructive sleep apnea)     USES cpap  . DM (diabetes mellitus)     insulin dependent  . Cerebrovascular accident     a. 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 CVA (cerebral infarction)     Past Surgical History  Procedure Laterality Date  . Cardiac catheterization      Nonobstructive coronary disease 2009  . Cardiac defibrillator placement      ICD-St. Jude  . Lipoma excision      History   Social History  . Marital  Status: Married    Spouse Name: N/A    Number of Children: 2  . Years of Education: N/A   Occupational History  . custodian    Social History Main Topics  . Smoking status: Former Smoker    Quit date: 05/04/1986  . Smokeless tobacco: Never Used     Comment: Quit smoking 30 yrs ago. Smoked as teenager less than 1/2 ppd. Smoked x 4 years.  . Alcohol Use: Yes     Comment: occasionally  . Drug Use: No  . Sexual Activity: Yes   Other Topics Concern  . Not on file   Social History Narrative  . No narrative on file    Family History  Problem Relation Age of Onset  . Coronary artery disease Mother     ROS:  no fevers or chills, productive cough, hemoptysis, dysphasia, odynophagia, melena, hematochezia, dysuria, hematuria, rash, seizure activity, orthopnea, PND, claudication. Remaining systems are negative.  Physical Exam:   Blood pressure 131/96, pulse 86, temperature 97.9 F (36.6 C), resp. rate 29, SpO2 98.00%.  General:  Well developed/well nourished in NAD Skin warm/dry Patient not depressed No peripheral clubbing Back-normal HEENT-normal/normal eyelids Neck supple/normal carotid upstroke bilaterally; no bruits; no JVD; no thyromegaly chest - CTA/ normal expansion CV - RRR/normal S1 and S2; no murmurs, rubs or gallops;  PMI nondisplaced Abdomen -NT/ND, no HSM, no  mass, + bowel sounds, no bruit 2+ femoral pulses, no bruits Ext-no edema, chords, 2+ DP Neuro-grossly nonfocal  ECG sinus rhythm, frequent PVCs, IVCD, lateral T wave inversion.  Results for orders placed during the hospital encounter of 12/29/12 (from the past 48 hour(s))  CBC     Status: Abnormal   Collection Time    12/29/12  6:22 PM      Result Value Range   WBC 5.4  4.0 - 10.5 K/uL   RBC 4.17 (*) 4.22 - 5.81 MIL/uL   Hemoglobin 12.8 (*) 13.0 - 17.0 g/dL   HCT 39.2  39.0 - 52.0 %   MCV 94.0  78.0 - 100.0 fL   MCH 30.7  26.0 - 34.0 pg   MCHC 32.7  30.0 - 36.0 g/dL   RDW 13.7  11.5 - 15.5 %    Platelets 190  150 - 400 K/uL  BASIC METABOLIC PANEL     Status: Abnormal   Collection Time    12/29/12  6:22 PM      Result Value Range   Sodium 144  135 - 145 mEq/L   Potassium 3.4 (*) 3.5 - 5.1 mEq/L   Chloride 108  96 - 112 mEq/L   CO2 24  19 - 32 mEq/L   Glucose, Bld 111 (*) 70 - 99 mg/dL   BUN 15  6 - 23 mg/dL   Creatinine, Ser 0.96  0.50 - 1.35 mg/dL   Calcium 8.7  8.4 - 10.5 mg/dL   GFR calc non Af Amer >90  >90 mL/min   GFR calc Af Amer >90  >90 mL/min   Comment: (NOTE)     The eGFR has been calculated using the CKD EPI equation.     This calculation has not been validated in all clinical situations.     eGFR's persistently <90 mL/min signify possible Chronic Kidney     Disease.  PROTIME-INR     Status: Abnormal   Collection Time    12/29/12  6:22 PM      Result Value Range   Prothrombin Time 20.7 (*) 11.6 - 15.2 seconds   INR 1.84 (*) 0.00 - 1.49  PRO B NATRIURETIC PEPTIDE     Status: Abnormal   Collection Time    12/29/12  6:22 PM      Result Value Range   Pro B Natriuretic peptide (BNP) 3439.0 (*) 0 - 125 pg/mL  POCT I-STAT TROPONIN I     Status: Abnormal   Collection Time    12/29/12  6:35 PM      Result Value Range   Troponin i, poc 0.30 (*) 0.00 - 0.08 ng/mL   Comment NOTIFIED PHYSICIAN     Comment 3            Comment: Due to the release kinetics of cTnI,     a negative result within the first hours     of the onset of symptoms does not rule out     myocardial infarction with certainty.     If myocardial infarction is still suspected,     repeat the test at appropriate intervals.  URINALYSIS, ROUTINE W REFLEX MICROSCOPIC     Status: Abnormal   Collection Time    12/29/12  6:46 PM      Result Value Range   Color, Urine YELLOW  YELLOW   APPearance CLEAR  CLEAR   Specific Gravity, Urine 1.011  1.005 - 1.030   pH 6.0  5.0 - 8.0  Glucose, UA NEGATIVE  NEGATIVE mg/dL   Hgb urine dipstick NEGATIVE  NEGATIVE   Bilirubin Urine NEGATIVE  NEGATIVE    Ketones, ur NEGATIVE  NEGATIVE mg/dL   Protein, ur 30 (*) NEGATIVE mg/dL   Urobilinogen, UA 1.0  0.0 - 1.0 mg/dL   Nitrite NEGATIVE  NEGATIVE   Leukocytes, UA NEGATIVE  NEGATIVE  URINE MICROSCOPIC-ADD ON     Status: Abnormal   Collection Time    12/29/12  6:46 PM      Result Value Range   Squamous Epithelial / LPF RARE  RARE   WBC, UA 0-2  <3 WBC/hpf   RBC / HPF 0-2  <3 RBC/hpf   Bacteria, UA RARE  RARE   Casts HYALINE CASTS (*) NEGATIVE   Urine-Other MUCOUS PRESENT      Dg Chest 2 View  12/29/2012   *RADIOLOGY REPORT*  Clinical Data: Pacemaker discharge x 3 today  CHEST - 2 VIEW  Comparison: 05/24/2012  Findings: Left subclavian AICD with lead tip projecting over right ventricle. Enlargement of cardiac silhouette with pulmonary vascular congestion. Slight chronic accentuation of right perihilar markings. No acute infiltrate, pleural effusion or pneumothorax. Bones unremarkable.  IMPRESSION: Enlargement of cardiac silhouette with pulmonary vascular congestion post AICD. No definite acute infiltrate.   Original Report Authenticated By: Lavonia Dana, M.D.    Assessment/Plan 1 ventricular tachycardia-the patient presents with multiple ICD shocks and by report had ventricular tachycardia/fibrillation. Plan to supplement potassium. Check magnesium. Continue beta blocker. Begin IV amiodarone load. 2 nonischemic cardiomyopathy-continue ACE inhibitor and beta blocker. 3 acute on chronic systolic congestive heart failure-the patient has noticed mild increasing pedal edema. Continue present dose of Lasix. Add spironolactone 25 mg daily both for diuresis and for mortality benefit. This should also help with his hypokalemia. 4 hypokalemia-supplement. 5 hypertension-continue present medications. 6 hyperlipidemia-continue statin. 7 diabetes mellitus-follow CBG. 8 mild elevation in troponin-most likely from ICD shock. Cycle enzymes. 9 history of atrial fibrillation-patient is in sinus rhythm. Continue  beta blocker and Coumadin. Kirk Ruths MD 12/29/2012, 8:08 PM

## 2012-12-29 NOTE — ED Notes (Signed)
MD at bedside. 

## 2012-12-29 NOTE — ED Notes (Signed)
Pacemaker interrogated. 

## 2012-12-29 NOTE — ED Notes (Addendum)
Lesile with [employer] called about patient's ICD- reports the pt has 12 episodes of VF, started at 1617 today and last episode at 1622 today, ICD shocked pt 12 times. MD and pt made aware.

## 2012-12-29 NOTE — ED Notes (Signed)
Pt c/o chest tightness. 5/10 pain. Nitro and xanax given to pt.

## 2012-12-29 NOTE — ED Notes (Signed)
Pt sts he walked to his friends house then was sitting at his friends house drinking a beer. Then he felt his pacemaker fire, he took a [employer], then immediately after taking it, the pacemaker fired again, then 5 mins later took another [employer]. Continued to sit there, then it fired again, pt reports he felt like the pacemaker was lighter each time it fired. Denies CP/SOB. sts he has been walking outside a lot lately. sts he usually is in a.fib, unsure if he gets PVCs often, sts he doesn't feel anything different than normal. Pt in nad, skin warm and dry, resp e/u.

## 2012-12-29 NOTE — Progress Notes (Addendum)
ANTICOAGULATION CONSULT NOTE - Initial Consult  Pharmacy Consult for coumadin Indication: atrial fibrillation  No Known Allergies  Vital Signs: Temp: 97.9 F (36.6 C) (08/28 1709) BP: 120/76 mmHg (08/28 2100) Pulse Rate: 78 (08/28 2100)  Labs:  Recent Labs  12/29/12 1822  HGB 12.8*  HCT 39.2  PLT 190  LABPROT 20.7*  INR 1.84*  CREATININE 0.96    The CrCl is unknown because both a height and weight (above a minimum accepted value) are required for this calculation.   Medical History: Past Medical History  Diagnosis Date  . CHF (congestive heart failure)     a. Likely NICM (out of proportion to CAD). b. 2009 - EF 25-30% by echo. c. s/p St. Jude ICD implantation 2007.  [name] Gout   . Hypokalemia   . HTN (hypertension)   . Lipoma   . Dyslipidemia   . CAD (coronary artery disease)     a. Nonobstructive by cath 2009.  [name] Atrial fibrillation     a. Noted 05/2008 by EKG.  . Paroxysmal VT     a. s/p St. Jude ICD 2007. b. H/o paroxysmal VT/VF.  [name] Cardiomyopathy   . ICD (implantable cardiac defibrillator) in place   . OSA (obstructive sleep apnea)     USES cpap  . DM (diabetes mellitus)     insulin dependent  . Cerebrovascular accident     a. 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 CVA (cerebral infarction)     Medications:  See med rec  Assessment: Patient's a 57 y.o M with ICD and on coumadin PTA for afib.  He presented to the ED today secondary to pacemaker firing.  INR is slightly subtherapeutic at 1.84 today.  Patient reported that home coumadin regimen is 10mg  daily except 15mg  on MWF with dose taken prior to coming in to the ED.  Goal of Therapy:  INR 2-3    Plan:  1) Since dose already taken for today, will not give any more additional doses.  F/u with AM labs.  Keidy Thurgood P 12/29/2012,9:49 PM

## 2012-12-30 ENCOUNTER — Other Ambulatory Visit: Payer: Self-pay | Admitting: Internal Medicine

## 2012-12-30 ENCOUNTER — Encounter (HOSPITAL_COMMUNITY): Payer: Self-pay | Admitting: *Deleted

## 2012-12-30 DIAGNOSIS — I5023 Acute on chronic systolic (congestive) heart failure: Secondary | ICD-10-CM

## 2012-12-30 DIAGNOSIS — I480 Paroxysmal atrial fibrillation: Secondary | ICD-10-CM

## 2012-12-30 DIAGNOSIS — I428 Other cardiomyopathies: Secondary | ICD-10-CM | POA: Diagnosis present

## 2012-12-30 LAB — GLUCOSE, CAPILLARY
Glucose-Capillary: 117 mg/dL — ABNORMAL HIGH (ref 70–99)
Glucose-Capillary: 130 mg/dL — ABNORMAL HIGH (ref 70–99)
Glucose-Capillary: 141 mg/dL — ABNORMAL HIGH (ref 70–99)

## 2012-12-30 LAB — CBC
HCT: 39.7 % (ref 39.0–52.0)
Hemoglobin: 13.2 g/dL (ref 13.0–17.0)
WBC: 6.9 10*3/uL (ref 4.0–10.5)

## 2012-12-30 LAB — BASIC METABOLIC PANEL
BUN: 13 mg/dL (ref 6–23)
Chloride: 106 mEq/L (ref 96–112)
Glucose, Bld: 114 mg/dL — ABNORMAL HIGH (ref 70–99)
Potassium: 3.2 mEq/L — ABNORMAL LOW (ref 3.5–5.1)

## 2012-12-30 LAB — T4, FREE: Free T4: 1.19 ng/dL (ref 0.80–1.80)

## 2012-12-30 LAB — TROPONIN I: Troponin I: 5.33 ng/mL (ref <0.30)

## 2012-12-30 LAB — PROTIME-INR
INR: 1.94 — ABNORMAL HIGH (ref 0.00–1.49)
Prothrombin Time: 21.6 seconds — ABNORMAL HIGH (ref 11.6–15.2)

## 2012-12-30 MED ORDER — FUROSEMIDE 10 MG/ML IJ SOLN
80.0000 mg | Freq: Two times a day (BID) | INTRAMUSCULAR | Status: DC
  Administered 2012-12-30 – 2013-01-01 (×5): 80 mg via INTRAVENOUS
  Filled 2012-12-30: qty 4
  Filled 2012-12-30 (×4): qty 8
  Filled 2012-12-30: qty 4
  Filled 2012-12-30: qty 8

## 2012-12-30 MED ORDER — HYDRALAZINE HCL 50 MG PO TABS
50.0000 mg | ORAL_TABLET | Freq: Three times a day (TID) | ORAL | Status: DC
  Administered 2012-12-30 – 2013-01-05 (×20): 50 mg via ORAL
  Filled 2012-12-30 (×23): qty 1

## 2012-12-30 MED ORDER — ASPIRIN 81 MG PO CHEW
81.0000 mg | CHEWABLE_TABLET | Freq: Every day | ORAL | Status: DC
  Administered 2012-12-30 – 2013-01-05 (×7): 81 mg via ORAL
  Filled 2012-12-30 (×7): qty 1

## 2012-12-30 MED ORDER — AMIODARONE LOAD VIA INFUSION
150.0000 mg | Freq: Once | INTRAVENOUS | Status: AC
  Administered 2012-12-30: 150 mg via INTRAVENOUS
  Filled 2012-12-30: qty 83.34

## 2012-12-30 MED ORDER — AMIODARONE HCL IN DEXTROSE 360-4.14 MG/200ML-% IV SOLN
60.0000 mg/h | INTRAVENOUS | Status: AC
  Administered 2012-12-30 (×2): 60 mg/h via INTRAVENOUS
  Filled 2012-12-30: qty 200

## 2012-12-30 MED ORDER — ONDANSETRON HCL 4 MG/2ML IJ SOLN
4.0000 mg | Freq: Four times a day (QID) | INTRAMUSCULAR | Status: DC | PRN
  Filled 2012-12-30: qty 2

## 2012-12-30 MED ORDER — METFORMIN HCL 500 MG PO TABS
500.0000 mg | ORAL_TABLET | Freq: Two times a day (BID) | ORAL | Status: DC
  Administered 2012-12-30 – 2013-01-01 (×5): 500 mg via ORAL
  Filled 2012-12-30 (×6): qty 1

## 2012-12-30 MED ORDER — SPIRONOLACTONE 25 MG PO TABS
25.0000 mg | ORAL_TABLET | Freq: Every day | ORAL | Status: DC
  Administered 2012-12-30 – 2013-01-05 (×7): 25 mg via ORAL
  Filled 2012-12-30 (×7): qty 1

## 2012-12-30 MED ORDER — RAMIPRIL 10 MG PO CAPS
10.0000 mg | ORAL_CAPSULE | Freq: Two times a day (BID) | ORAL | Status: DC
  Administered 2012-12-30 – 2013-01-05 (×14): 10 mg via ORAL
  Filled 2012-12-30 (×15): qty 1

## 2012-12-30 MED ORDER — SODIUM CHLORIDE 0.9 % IJ SOLN
3.0000 mL | Freq: Two times a day (BID) | INTRAMUSCULAR | Status: DC
  Administered 2012-12-30 – 2013-01-04 (×8): 3 mL via INTRAVENOUS

## 2012-12-30 MED ORDER — FUROSEMIDE 80 MG PO TABS
80.0000 mg | ORAL_TABLET | Freq: Two times a day (BID) | ORAL | Status: DC
  Filled 2012-12-30 (×3): qty 1

## 2012-12-30 MED ORDER — POTASSIUM CHLORIDE CRYS ER 20 MEQ PO TBCR
40.0000 meq | EXTENDED_RELEASE_TABLET | Freq: Three times a day (TID) | ORAL | Status: DC
  Administered 2012-12-30 – 2013-01-05 (×19): 40 meq via ORAL
  Filled 2012-12-30 (×24): qty 2

## 2012-12-30 MED ORDER — INSULIN GLARGINE 100 UNIT/ML ~~LOC~~ SOLN
10.0000 [IU] | Freq: Every day | SUBCUTANEOUS | Status: DC
  Administered 2012-12-30 – 2013-01-04 (×7): 10 [IU] via SUBCUTANEOUS
  Filled 2012-12-30 (×8): qty 0.1

## 2012-12-30 MED ORDER — ISOSORBIDE DINITRATE 20 MG PO TABS
20.0000 mg | ORAL_TABLET | Freq: Three times a day (TID) | ORAL | Status: DC
  Administered 2012-12-30 – 2013-01-05 (×19): 20 mg via ORAL
  Filled 2012-12-30 (×21): qty 1

## 2012-12-30 MED ORDER — ATORVASTATIN CALCIUM 40 MG PO TABS
40.0000 mg | ORAL_TABLET | Freq: Every day | ORAL | Status: DC
  Administered 2012-12-30 – 2013-01-05 (×7): 40 mg via ORAL
  Filled 2012-12-30 (×7): qty 1

## 2012-12-30 MED ORDER — CARVEDILOL 25 MG PO TABS
50.0000 mg | ORAL_TABLET | Freq: Two times a day (BID) | ORAL | Status: DC
  Administered 2012-12-30 – 2013-01-05 (×13): 50 mg via ORAL
  Filled 2012-12-30 (×15): qty 2

## 2012-12-30 MED ORDER — TIZANIDINE HCL 4 MG PO TABS
4.0000 mg | ORAL_TABLET | Freq: Four times a day (QID) | ORAL | Status: DC | PRN
  Filled 2012-12-30: qty 1

## 2012-12-30 MED ORDER — WARFARIN SODIUM 7.5 MG PO TABS
15.0000 mg | ORAL_TABLET | Freq: Once | ORAL | Status: AC
  Administered 2012-12-30: 15 mg via ORAL
  Filled 2012-12-30 (×2): qty 2

## 2012-12-30 MED ORDER — SODIUM CHLORIDE 0.9 % IV SOLN: 250.0000 mL | INTRAVENOUS | Status: DC | PRN

## 2012-12-30 MED ORDER — FUROSEMIDE 10 MG/ML IJ SOLN
INTRAMUSCULAR | Status: AC
  Administered 2012-12-30: 80 mg
  Filled 2012-12-30: qty 8

## 2012-12-30 MED ORDER — AMLODIPINE BESYLATE 10 MG PO TABS
10.0000 mg | ORAL_TABLET | Freq: Every day | ORAL | Status: DC
  Administered 2012-12-30 – 2013-01-05 (×7): 10 mg via ORAL
  Filled 2012-12-30 (×7): qty 1

## 2012-12-30 MED ORDER — AMIODARONE HCL IN DEXTROSE 360-4.14 MG/200ML-% IV SOLN
30.0000 mg/h | INTRAVENOUS | Status: DC
  Administered 2012-12-30 – 2013-01-02 (×7): 30 mg/h via INTRAVENOUS
  Filled 2012-12-30 (×14): qty 200

## 2012-12-30 MED ORDER — SODIUM CHLORIDE 0.9 % IJ SOLN: 3.0000 mL | INTRAMUSCULAR | Status: DC | PRN

## 2012-12-30 MED ORDER — ALLOPURINOL 300 MG PO TABS
300.0000 mg | ORAL_TABLET | Freq: Every day | ORAL | Status: DC
  Administered 2012-12-30 – 2013-01-05 (×7): 300 mg via ORAL
  Filled 2012-12-30 (×7): qty 1

## 2012-12-30 MED ORDER — ACETAMINOPHEN 325 MG PO TABS
650.0000 mg | ORAL_TABLET | ORAL | Status: DC | PRN
  Administered 2013-01-04: 650 mg via ORAL
  Filled 2012-12-30 (×2): qty 2

## 2012-12-30 MED ORDER — POTASSIUM CHLORIDE CRYS ER 20 MEQ PO TBCR
40.0000 meq | EXTENDED_RELEASE_TABLET | Freq: Once | ORAL | Status: AC
  Administered 2012-12-30: 40 meq via ORAL

## 2012-12-30 NOTE — Telephone Encounter (Signed)
r 

## 2012-12-30 NOTE — Progress Notes (Signed)
ANTICOAGULATION CONSULT NOTE - Follow-up  Pharmacy Consult for coumadin Indication: atrial fibrillation  No Known Allergies  Vital Signs: Temp: 98 F (36.7 C) (08/29 0430) Temp src: Oral (08/29 0430) BP: 126/90 mmHg (08/29 0430) Pulse Rate: 84 (08/29 0430)  Labs:  Recent Labs  12/29/12 1822 12/30/12 0056 12/30/12 0725  HGB 12.8*  --  13.2  HCT 39.2  --  39.7  PLT 190  --  190  LABPROT 20.7*  --  21.6*  INR 1.84*  --  1.94*  CREATININE 0.96  --  0.96  TROPONINI  --  2.39*  --     Estimated Creatinine Clearance: 115 ml/min (by C-G formula based on Cr of 0.96).  Assessment: Patient's a 57 y.o M with ICD and on coumadin PTA for afib.  He presented to the ED on 8/28 secondary to pacemaker firing.  INR remains slightly low at 1.94, CBC is stable and not bleeding noted.  Goal of Therapy:  INR 2-3   Plan:  1. Warfarin 15mg  PO x 1 tonight (normal home dose) 2. F/u AM INR  Salome Arnt, PharmD, BCPS Pager # [phone] 12/30/2012 8:46 AM

## 2012-12-30 NOTE — Care Management Note (Unsigned)
    Page 1 of 1   12/30/2012     5:07:37 PM   CARE MANAGEMENT NOTE 12/30/2012  Patient:  [name], [name]   Account Number:  [id number]  Date Initiated:  12/30/2012  Documentation initiated by:  Mareo Portilla  Subjective/Objective Assessment:   PT ADM WITH CHF, VT STORM.  PTA, PT RESIDES AT [place].     Action/Plan:   CASE MGMT WILL FOLLOW FOR DISCHARGE NEEDS AS PT PROGRESSES.   Anticipated DC Date:  01/03/2013   Anticipated DC Plan:  [place]  CM consult      Choice offered to / List presented to:             Status of service:  In process, will continue to follow Medicare Important Message given?   (If response is "NO", the following Medicare IM given date fields will be blank) Date Medicare IM given:   Date Additional Medicare IM given:    Discharge Disposition:    Per UR Regulation:  Reviewed for med. necessity/level of care/duration of stay  If discussed at [place] of Stay Meetings, dates discussed:    Comments:

## 2012-12-30 NOTE — Progress Notes (Signed)
Patient Name: [name] Date of Encounter: 12/30/2012   Principal Problem:   Ventricular tachycardia Active Problems:   Acute on chronic systolic CHF (congestive heart failure)   Nonischemic cardiomyopathy   DIABETES MELLITUS, TYPE II   HYPERTENSION   Obesity   ICD-St.Jude   PAF (paroxysmal atrial fibrillation)    SUBJECTIVE  Pt c/o orthopnea and dyspnea overnight.  Noticeable increased wob this AM.  Slightest mvmt/repositioning causes dyspnea.  Also had c/p overnight.  Currently pain free.  Hemodynamically stable.   CURRENT MEDS . allopurinol  300 mg Oral Daily  . amLODipine  10 mg Oral Daily  . atorvastatin  40 mg Oral Daily  . carvedilol  50 mg Oral BID WC  . furosemide      . furosemide  80 mg Intravenous BID  . hydrALAZINE  50 mg Oral Q8H  . insulin glargine  10 Units Subcutaneous QHS  . isosorbide dinitrate  20 mg Oral TID  . metFORMIN  500 mg Oral BID  . potassium chloride SA  40 mEq Oral TID  . ramipril  10 mg Oral BID  . sodium chloride  3 mL Intravenous Q12H  . spironolactone  25 mg Oral Daily  . Warfarin - Pharmacist Dosing Inpatient   Does not apply q1800   OBJECTIVE  Filed Vitals:   12/30/12 0047 12/30/12 0345 12/30/12 0430 12/30/12 0500  BP: 120/90 118/91 126/90   Pulse:  78 84   Temp:   98 F (36.7 C)   TempSrc:   Oral   Resp:  26 26   Height:      Weight:    264 lb 15.9 oz (120.2 kg)  SpO2:  96% 91%     Intake/Output Summary (Last 24 hours) at 12/30/12 0729 Last data filed at 12/30/12 0600  Gross per 24 hour  Intake 901.86 ml  Output      0 ml  Net 901.86 ml   Filed Weights   12/29/12 2355 12/30/12 0500  Weight: 264 lb 15.9 oz (120.2 kg) 264 lb 15.9 oz (120.2 kg)    PHYSICAL EXAM  General: Pleasant, appears uncomfortable, some increase wob. Neuro: Alert and oriented X 3. Moves all extremities spontaneously. Psych: Normal affect. HEENT:  Normal  Neck: Supple without bruits.  Neck veins to jaw. Lungs:  Resp regular, mildly  tachypneic, diffuse insp/exp rhonchi/wheezing. Heart: Irreg, no s3, s4, or murmurs. Abdomen: firm/distended, non-tender, BS + x 4.  Extremities: No clubbing, cyanosis.  1+ bilat LEE. DP/PT/Radials 2+ and equal bilaterally.  Accessory Clinical Findings  CBC  Recent Labs  12/29/12 1822  WBC 5.4  HGB 12.8*  HCT 39.2  MCV 94.0  PLT 99991111   Basic Metabolic Panel  Recent Labs  12/29/12 1822 12/30/12 0056  NA 144  --   K 3.4*  --   CL 108  --   CO2 24  --   GLUCOSE 111*  --   BUN 15  --   CREATININE 0.96  --   CALCIUM 8.7  --   MG  --  1.9   Cardiac Enzymes  Recent Labs  12/30/12 0056  TROPONINI 2.39*   Lab Results  Component Value Date   INR 1.84* 12/29/2012   INR 3.8 12/23/2012   INR 3.5 11/25/2012   PROTIME 18.2 10/11/2008   TELE  Sinus rhythm, 1st deg avb, freq pvc's couplets.  ECG  Sinus, 1st deg, 86, freq pvc's, ivcd, poor r prog, no acute st/t changes.  Radiology/Studies  Dg Chest 2 View  12/29/2012   *RADIOLOGY REPORT*  Clinical Data: Pacemaker discharge x 3 today  CHEST - 2 VIEW  Comparison: 05/24/2012  Findings: Left subclavian AICD with lead tip projecting over right ventricle. Enlargement of cardiac silhouette with pulmonary vascular congestion. Slight chronic accentuation of right perihilar markings. No acute infiltrate, pleural effusion or pneumothorax. Bones unremarkable.  IMPRESSION: Enlargement of cardiac silhouette with pulmonary vascular congestion post AICD. No definite acute infiltrate.   Original Report Authenticated By: Lavonia Dana, M.D.    ASSESSMENT AND PLAN  1.  Acute on chronic systolic chf/nicm:  Pt with progressive dyspnea and orthopnea overnight.  I/O + 901 yesterday.  Weight up to 264 from low of ~ 256 on 7/16.  He has evidence of volume overload on exam with distended abd, elevated neck veins, and LEE.  Will switch lasix to IV and give 80mg  now.  HR/BP stable.  Cont acei/spiro/hydral/ntg/bb.  F/u cxr this AM.  2.  VT/VF storm:  No  further VT/shocks.  On IV amio.  EP to see this AM.  I suspect this is related to #1. He will need amio as an outpatient.  3.  Hypokalemia:  F/u bmet pending this AM.  4.  Elevated troponin:  Likely 2/2 multiple ICD shocks.  He did have some c/p overnight.  ECG is non-acute.  Cath in 2009 showed nonobs dzs.  Continue to trend troponins.  He is currently anticoagulated with coumadin, though INR slightly subRx yesterday, f/u this AM and add heparin if still subRx until we can see how troponin trends.  If significant troponin rise, will have to consider repeat ischemic evaluation and possibly cath, given ongoing risk factors for coronary disease.  Cont bb and statin.  Add asa for the time being.  5.  HTN:  Stable on bb/ccb/diuretics/hydral/ntg.  6.  PAF:  In sinus.  Cont bb and coumadin.  Will hold coumadin only if significant troponin rise or plan for cath.  Signed, Murray Hodgkins NP  Cardiology/EP Attending  Patient seen and examined. I have discussed all aspects of care with [name] and have amended the note above as needed. He will continue IV lasix and amio through the weekend, switching to po amio if stable on Monday. If Troponin go above 5, then I would repeat cath. I doubt this is primary ischemic event but that troponin elevation related to multiple ICD shocks in the setting of acute on chronic systolic heart failure (wt up 10 lbs.)  Mikle Bosworth.D.

## 2012-12-30 NOTE — Progress Notes (Signed)
CRITICAL VALUE ALERT  Critical value received:  Troponin 2.39 Date of notification:  12/30/2012   Time of notification:  0150  Critical value read back:yes  Nurse who received alert:  Fayrene Helper   MD notified (1st page):  Dr. Colon Flattery  Time of first page:  0200  MD notified (2nd page):  Time of second page:  Responding MD:  Dr. Colon Flattery  Time MD responded:  [phone]

## 2012-12-30 NOTE — Progress Notes (Signed)
Utilization review completed. Story Vanvranken, RN, BSN. 

## 2012-12-30 NOTE — Progress Notes (Signed)
This RT went to set up CPAP for tonight and patient stated "I do not wear CPAP at night at home and I do not want to wear it here." RT for tonight will be notified.

## 2012-12-31 DIAGNOSIS — I5023 Acute on chronic systolic (congestive) heart failure: Principal | ICD-10-CM

## 2012-12-31 DIAGNOSIS — I509 Heart failure, unspecified: Secondary | ICD-10-CM

## 2012-12-31 LAB — BASIC METABOLIC PANEL
Calcium: 8.7 mg/dL (ref 8.4–10.5)
Chloride: 106 mEq/L (ref 96–112)
Creatinine, Ser: 0.91 mg/dL (ref 0.50–1.35)
GFR calc Af Amer: >90 mL/min (ref >90)

## 2012-12-31 LAB — GLUCOSE, CAPILLARY
Glucose-Capillary: 126 mg/dL — ABNORMAL HIGH (ref 70–99)
Glucose-Capillary: 97 mg/dL (ref 70–99)
Glucose-Capillary: 98 mg/dL (ref 70–99)

## 2012-12-31 LAB — TROPONIN I: Troponin I: 1.76 ng/mL (ref <0.30)

## 2012-12-31 MED ORDER — HEPARIN (PORCINE) IN NACL 100-0.45 UNIT/ML-% IJ SOLN
1750.0000 [IU]/h | INTRAMUSCULAR | Status: DC
  Administered 2012-12-31: 1300 [IU]/h via INTRAVENOUS
  Administered 2012-12-31 – 2013-01-03 (×5): 1750 [IU]/h via INTRAVENOUS
  Filled 2012-12-31 (×11): qty 250

## 2012-12-31 MED ORDER — WARFARIN SODIUM 7.5 MG PO TABS
15.0000 mg | ORAL_TABLET | Freq: Once | ORAL | Status: DC
  Filled 2012-12-31: qty 2

## 2012-12-31 NOTE — Progress Notes (Addendum)
Pt refusing CPAP.  States he does not wear at home and does not want here.

## 2012-12-31 NOTE — Progress Notes (Signed)
Pt had 17 beat run of Vtach. Paged cardiology on call.

## 2012-12-31 NOTE — Progress Notes (Signed)
ANTICOAGULATION CONSULT NOTE - Follow Up Consult  Pharmacy Consult for heparin Indication: atrial fibrillation  Labs:  Recent Labs  12/29/12 1822 12/30/12 0056 12/30/12 0725 12/31/12 0420 12/31/12 1300 12/31/12 2205  HGB 12.8*  --  13.2  --   --   --   HCT 39.2  --  39.7  --   --   --   PLT 190  --  190  --   --   --   LABPROT 20.7*  --  21.6* 21.8*  --   --   INR 1.84*  --  1.94* 1.97*  --   --   HEPARINUNFRC  --   --   --   --   --  <0.10*  CREATININE 0.96  --  0.96  --  0.91  --   TROPONINI  --  2.39* 5.33*  --  1.76*  --     Assessment: 56yo male undetectable on heparin with initial dosing for Afib while Coumadin on hold.  Goal of Therapy:  Heparin level 0.3-0.7 units/ml   Plan:  Will increase heparin gtt by 4 units/kg/hr to 1750 units/hr and check level in 6hr.  Wynona Neat, PharmD, BCPS  12/31/2012,11:29 PM

## 2012-12-31 NOTE — Progress Notes (Addendum)
ANTICOAGULATION CONSULT NOTE - Follow-up  Pharmacy Consult for coumadin>>now heparin Indication: atrial fibrillation  No Known Allergies  Vital Signs: Temp: 97.8 F (36.6 C) (08/30 0400) Temp src: Oral (08/30 0400) BP: 111/83 mmHg (08/30 0700) Pulse Rate: 71 (08/30 0700)  Labs:  Recent Labs  12/29/12 1822 12/30/12 0056 12/30/12 0725 12/31/12 0420  HGB 12.8*  --  13.2  --   HCT 39.2  --  39.7  --   PLT 190  --  190  --   LABPROT 20.7*  --  21.6* 21.8*  INR 1.84*  --  1.94* 1.97*  CREATININE 0.96  --  0.96  --   TROPONINI  --  2.39* 5.33*  --     Estimated Creatinine Clearance: 114.2 ml/min (by C-G formula based on Cr of 0.96).  Assessment: CC: Pacemaker firing/VT  PMH: CHF, gout, HTN, lipoma, HLD, CAD, paroxysmal VT, OSA, DM, CVA,   AC: Coumadin for afib - INR 1.97, H/H 13.2/39.7, plts 190, no bleeding noted (PTA 10mg  daily except 15mg  MWF) --Received order to start IV heparin and hold Coumadin (Heparn dosing weight 103kg)  Goal: Heparin level 0.3-0.7  Plan: 1. Hold Coumadin 2 Start IV heparin (no bolus) at 1300 units/hr 3. Check heparin level in 8 hrs and daily.   Domonique Cothran S. Alford Highland, PharmD, [facility] Clinical Staff Pharmacist Pager [phone]   12/31/2012 7:20 AM

## 2012-12-31 NOTE — Progress Notes (Signed)
Patient ID: [name], male   DOB: 28-May-1955, 57 y.o.   MRN: [id number]    SUBJECTIVE: No chest pain.  Breathing better.  Weight is down 3 lbs.  Patient says he thinks a fair amount of urine was flushed without being measured so not sure of accuracy of I/Os.  TnI up to 5.33 yesterday. No VT.   [name] allopurinol  300 mg Oral Daily  . amLODipine  10 mg Oral Daily  . aspirin  81 mg Oral Daily  . atorvastatin  40 mg Oral Daily  . carvedilol  50 mg Oral BID WC  . furosemide  80 mg Intravenous BID  . hydrALAZINE  50 mg Oral Q8H  . insulin glargine  10 Units Subcutaneous QHS  . isosorbide dinitrate  20 mg Oral TID  . metFORMIN  500 mg Oral BID  . potassium chloride SA  40 mEq Oral TID  . ramipril  10 mg Oral BID  . sodium chloride  3 mL Intravenous Q12H  . spironolactone  25 mg Oral Daily  amiodarone gtt   Filed Vitals:   12/31/12 0559 12/31/12 0700 12/31/12 0800 12/31/12 0900  BP: 118/96 111/83 106/74 110/79  Pulse:  71 70   Temp:   97.7 F (36.5 C)   TempSrc:   Axillary   Resp:  18 21 23   Height:      Weight:      SpO2:  94% 91%     Intake/Output Summary (Last 24 hours) at 12/31/12 1233 Last data filed at 12/31/12 0800  Gross per 24 hour  Intake  790.3 ml  Output   1425 ml  Net -634.7 ml    LABS: Basic Metabolic Panel:  Recent Labs  12/29/12 1822 12/30/12 0056 12/30/12 0725  NA 144  --  143  K 3.4*  --  3.2*  CL 108  --  106  CO2 24  --  27  GLUCOSE 111*  --  114*  BUN 15  --  13  CREATININE 0.96  --  0.96  CALCIUM 8.7  --  8.9  MG  --  1.9  --    Liver Function Tests: No results found for this basename: AST, ALT, ALKPHOS, BILITOT, PROT, ALBUMIN,  in the last 72 hours No results found for this basename: LIPASE, AMYLASE,  in the last 72 hours CBC:  Recent Labs  12/29/12 1822 12/30/12 0725  WBC 5.4 6.9  HGB 12.8* 13.2  HCT 39.2 39.7  MCV 94.0 93.6  PLT 190 190   Cardiac Enzymes:  Recent Labs  12/30/12 0056 12/30/12 0725  TROPONINI 2.39*  5.33*   BNP: No components found with this basename: POCBNP,  D-Dimer: No results found for this basename: DDIMER,  in the last 72 hours Hemoglobin A1C: No results found for this basename: HGBA1C,  in the last 72 hours Fasting Lipid Panel: No results found for this basename: CHOL, HDL, LDLCALC, TRIG, CHOLHDL, LDLDIRECT,  in the last 72 hours Thyroid Function Tests:  Recent Labs  12/30/12 0056  TSH 1.313   Anemia Panel: No results found for this basename: VITAMINB12, FOLATE, FERRITIN, TIBC, IRON, RETICCTPCT,  in the last 72 hours  RADIOLOGY: Dg Chest 2 View  12/29/2012   *RADIOLOGY REPORT*  Clinical Data: Pacemaker discharge x 3 today  CHEST - 2 VIEW  Comparison: 05/24/2012  Findings: Left subclavian AICD with lead tip projecting over right ventricle. Enlargement of cardiac silhouette with pulmonary vascular congestion. Slight chronic accentuation of right perihilar  markings. No acute infiltrate, pleural effusion or pneumothorax. Bones unremarkable.  IMPRESSION: Enlargement of cardiac silhouette with pulmonary vascular congestion post AICD. No definite acute infiltrate.   Original Report Authenticated By: Lavonia Dana, M.D.    PHYSICAL EXAM General: NAD Neck: JVP 12 cm, no thyromegaly or thyroid nodule.  Lungs: Clear to auscultation bilaterally with normal respiratory effort. CV: Nondisplaced PMI.  Heart regular S1/S2, no S3/S4, no murmur.  No peripheral edema.   Abdomen: Soft, nontender, no hepatosplenomegaly, no distention.  Neurologic: Alert and oriented x 3.  Psych: Normal affect. Extremities: No clubbing or cyanosis.   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  1. VT: VT storm, most likely due to acute on chronic systolic CHF/volume overload.  He is now on IV amiodarone.  Will plan to continue IV until Monday.  Troponin to 5.33.   Suspect this is due to multiple ICD shocks and volume overload, but no recent cath (last 2009).  Per Dr. Tanna Furry note, will hold coumadin and use heparin gtt.  Dr. Lovena Le can reassess Monday but would likely be ready for cath Tuesday if desired.  2. Acute on chronic systolic CHF: On good medical regimen.  Weight is down though I/Os only mildly negative.  ? Accuracy of UOP recording.  Will continue current IV Lasix for now.  He still has volume.    [name] 12/31/2012 12:43 PM

## 2013-01-01 DIAGNOSIS — E876 Hypokalemia: Secondary | ICD-10-CM

## 2013-01-01 DIAGNOSIS — I472 Ventricular tachycardia: Secondary | ICD-10-CM

## 2013-01-01 DIAGNOSIS — I4891 Unspecified atrial fibrillation: Secondary | ICD-10-CM

## 2013-01-01 LAB — CBC
HCT: 38.7 % — ABNORMAL LOW (ref 39.0–52.0)
Hemoglobin: 12.9 g/dL — ABNORMAL LOW (ref 13.0–17.0)
MCH: 30.7 pg (ref 26.0–34.0)
MCHC: 33.3 g/dL (ref 30.0–36.0)
MCV: 92.1 fL (ref 78.0–100.0)

## 2013-01-01 LAB — GLUCOSE, CAPILLARY
Glucose-Capillary: 89 mg/dL (ref 70–99)
Glucose-Capillary: 90 mg/dL (ref 70–99)

## 2013-01-01 LAB — BASIC METABOLIC PANEL
BUN: 10 mg/dL (ref 6–23)
CO2: 25 mEq/L (ref 19–32)
Chloride: 106 mEq/L (ref 96–112)
Creatinine, Ser: 0.97 mg/dL (ref 0.50–1.35)

## 2013-01-01 MED ORDER — FUROSEMIDE 80 MG PO TABS
80.0000 mg | ORAL_TABLET | Freq: Two times a day (BID) | ORAL | Status: DC
  Administered 2013-01-01 – 2013-01-05 (×7): 80 mg via ORAL
  Filled 2013-01-01 (×10): qty 1

## 2013-01-01 MED ORDER — POTASSIUM CHLORIDE CRYS ER 20 MEQ PO TBCR
40.0000 meq | EXTENDED_RELEASE_TABLET | Freq: Once | ORAL | Status: AC
  Administered 2013-01-01: 40 meq via ORAL

## 2013-01-01 MED ORDER — POTASSIUM CHLORIDE CRYS ER 20 MEQ PO TBCR: 40.0000 meq | EXTENDED_RELEASE_TABLET | Freq: Once | ORAL | Status: DC

## 2013-01-01 MED ORDER — INSULIN ASPART 100 UNIT/ML ~~LOC~~ SOLN
0.0000 [IU] | Freq: Three times a day (TID) | SUBCUTANEOUS | Status: DC
  Administered 2013-01-02: 2 [IU] via SUBCUTANEOUS

## 2013-01-01 NOTE — Progress Notes (Addendum)
Patient ID: [name], male   DOB: 07-24-1955, 57 y.o.   MRN: [id number]    SUBJECTIVE:   No chest pain or dyspnea. I/Os - 1.9L but weight up 3 pounds? INR down to 1.9. Remains on amio gtt. No further ICD shocks. Troponin trending down from 5.5.  On tele SR with PVCs. 6 beat run NSVT. K 3.2  . allopurinol  300 mg Oral Daily  . amLODipine  10 mg Oral Daily  . aspirin  81 mg Oral Daily  . atorvastatin  40 mg Oral Daily  . carvedilol  50 mg Oral BID WC  . furosemide  80 mg Intravenous BID  . hydrALAZINE  50 mg Oral Q8H  . insulin glargine  10 Units Subcutaneous QHS  . isosorbide dinitrate  20 mg Oral TID  . metFORMIN  500 mg Oral BID  . potassium chloride SA  40 mEq Oral TID  . ramipril  10 mg Oral BID  . sodium chloride  3 mL Intravenous Q12H  . spironolactone  25 mg Oral Daily  amiodarone gtt   Filed Vitals:   12/31/12 2202 12/31/12 2300 01/01/13 0434 01/01/13 0800  BP: 128/92 124/84 128/81 127/89  Pulse:  71 84   Temp:  97.9 F (36.6 C)  98.3 F (36.8 C)  TempSrc:  Oral    Resp:  20 24 29   Height:      Weight:   120.1 kg (264 lb 12.4 oz)   SpO2:  94% 96% 98%    Intake/Output Summary (Last 24 hours) at 01/01/13 0915 Last data filed at 01/01/13 0800  Gross per 24 hour  Intake 1045.15 ml  Output   3000 ml  Net -1954.85 ml    LABS: Basic Metabolic Panel:  Recent Labs  12/30/12 0056  12/31/12 1300 01/01/13 0535  NA  --   < > 141 139  K  --   < > 3.6 3.2*  CL  --   < > 106 106  CO2  --   < > 25 25  GLUCOSE  --   < > 91 102*  BUN  --   < > 11 10  CREATININE  --   < > 0.91 0.97  CALCIUM  --   < > 8.7 8.7  MG 1.9  --   --   --   < > = values in this interval not displayed. Liver Function Tests: No results found for this basename: AST, ALT, ALKPHOS, BILITOT, PROT, ALBUMIN,  in the last 72 hours No results found for this basename: LIPASE, AMYLASE,  in the last 72 hours CBC:  Recent Labs  12/30/12 0725 01/01/13 0535  WBC 6.9 5.7  HGB 13.2 12.9*   HCT 39.7 38.7*  MCV 93.6 92.1  PLT 190 198   Cardiac Enzymes:  Recent Labs  12/30/12 0056 12/30/12 0725 12/31/12 1300  TROPONINI 2.39* 5.33* 1.76*   BNP: No components found with this basename: POCBNP,  D-Dimer: No results found for this basename: DDIMER,  in the last 72 hours Hemoglobin A1C: No results found for this basename: HGBA1C,  in the last 72 hours Fasting Lipid Panel: No results found for this basename: CHOL, HDL, LDLCALC, TRIG, CHOLHDL, LDLDIRECT,  in the last 72 hours Thyroid Function Tests:  Recent Labs  12/30/12 0056  TSH 1.313   Anemia Panel: No results found for this basename: VITAMINB12, FOLATE, FERRITIN, TIBC, IRON, RETICCTPCT,  in the last 72 hours  RADIOLOGY: Dg Chest 2 View  12/29/2012   *RADIOLOGY REPORT*  Clinical Data: Pacemaker discharge x 3 today  CHEST - 2 VIEW  Comparison: 05/24/2012  Findings: Left subclavian AICD with lead tip projecting over right ventricle. Enlargement of cardiac silhouette with pulmonary vascular congestion. Slight chronic accentuation of right perihilar markings. No acute infiltrate, pleural effusion or pneumothorax. Bones unremarkable.  IMPRESSION: Enlargement of cardiac silhouette with pulmonary vascular congestion post AICD. No definite acute infiltrate.   Original Report Authenticated By: Lavonia Dana, M.D.    PHYSICAL EXAM General: NAD Neck: JVP 6 cm, no thyromegaly or thyroid nodule.  Lungs: Clear to auscultation bilaterally with normal respiratory effort. CV: Nondisplaced PMI.  Heart regular S1/S2, no S3/S4, no murmur.  No peripheral edema.   Abdomen: Soft, nontender, no hepatosplenomegaly, no distention.  Neurologic: Alert and oriented x 3.  Psych: Normal affect. Extremities: No clubbing or cyanosis.   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   1. VT: VT storm,  most likely due to acute on chronic systolic CHF/volume overload.  He is now on IV amiodarone. Will switch amio to po tomorrow.  2. Elevated troponin. Suspect this is due to multiple ICD shocks and volume overload, but no recent cath (last 2009).  Per Dr. Tanna Furry note, will hold coumadin and use heparin gtt.  Dr. Lovena Le can reassess Monday but would likely be ready for cath Tuesday if desired. Would do R & L cath.  3. Acute on chronic systolic CHF: On good medical regimen.  I weighed patient myself on standing scale and he is 258 pounds. Volume status improved. Switch back to po lasix. After d/c I think it wold be reasonable to get CPX study to further risk stratify from HF perspective.  4. Hypokalemia - will supp 5. DM2 - hold metformin for cath. Cover with SSI 6. PAF - in NSR. On heparin while off coumadin  Glori Bickers 01/01/2013 9:15 AM

## 2013-01-01 NOTE — Progress Notes (Signed)
Pt refuses to wear CPAP. Pt has been refusing stating he does not wear at home. No distress noted at this time.

## 2013-01-01 NOTE — Progress Notes (Signed)
ANTICOAGULATION CONSULT NOTE - Follow Up Consult  Pharmacy Consult for heparin Indication: atrial fibrillation  Labs:  Recent Labs  12/29/12 1822 12/30/12 0056 12/30/12 0725 12/31/12 0420 12/31/12 1300 12/31/12 2205 01/01/13 0535  HGB 12.8*  --  13.2  --   --   --  12.9*  HCT 39.2  --  39.7  --   --   --  38.7*  PLT 190  --  190  --   --   --  198  LABPROT 20.7*  --  21.6* 21.8*  --   --  21.3*  INR 1.84*  --  1.94* 1.97*  --   --  1.91*  HEPARINUNFRC  --   --   --   --   --  <0.10* 0.34  CREATININE 0.96  --  0.96  --  0.91  --  0.97  TROPONINI  --  2.39* 5.33*  --  1.76*  --   --     Assessment/Plan: 57yo male now therapeutic on heparin after rate increase.  Will continue gtt at current rate and confirm stable with additional level.  Wynona Neat, PharmD, BCPS  01/01/2013,6:34 AM

## 2013-01-01 NOTE — Progress Notes (Signed)
ANTICOAGULATION CONSULT NOTE - Follow Up Consult  Pharmacy Consult for Heparin Indication: atrial fibrillation  No Known Allergies  Patient Measurements: Height: 6' (182.9 cm) Weight: 258 lb (117.028 kg) IBW/kg (Calculated) : 77.6 Heparin Dosing Weight:    Vital Signs: Temp: 98.5 F (36.9 C) (08/31 1216) Temp src: Oral (08/31 1216) BP: 124/78 mmHg (08/31 1216) Pulse Rate: 71 (08/31 1216)  Labs:  Recent Labs  12/29/12 1822 12/30/12 0056 12/30/12 0725 12/31/12 0420 12/31/12 1300 12/31/12 2205 01/01/13 0535 01/01/13 1200  HGB 12.8*  --  13.2  --   --   --  12.9*  --   HCT 39.2  --  39.7  --   --   --  38.7*  --   PLT 190  --  190  --   --   --  198  --   LABPROT 20.7*  --  21.6* 21.8*  --   --  21.3*  --   INR 1.84*  --  1.94* 1.97*  --   --  1.91*  --   HEPARINUNFRC  --   --   --   --   --  <0.10* 0.34 0.47  CREATININE 0.96  --  0.96  --  0.91  --  0.97  --   TROPONINI  --  2.39* 5.33*  --  1.76*  --   --   --     Estimated Creatinine Clearance: 112.3 ml/min (by C-G formula based on Cr of 0.97).   Assessment: CC: Pacemaker firing/VT  PMH: CHF, gout, HTN, lipoma, HLD, CAD, paroxysmal VT, OSA, DM, CVA,   AC: Coumadin (on hold) for afib - INR 1.91 today, no bleeding noted (PTA 10mg  daily except 15mg  MWF). Heparin level 0.34 this am. Repeat heparin level 0.47.   Goal of Therapy:  Heparin level 0.3-0.7 units/ml Monitor platelets by anticoagulation protocol: Yes   Plan:  1. Holding Coumadin 2. IV heparin 1750 units/hr   Eilene Ghazi Stillinger 01/01/2013,2:06 PM

## 2013-01-02 DIAGNOSIS — Z7901 Long term (current) use of anticoagulants: Secondary | ICD-10-CM

## 2013-01-02 DIAGNOSIS — I428 Other cardiomyopathies: Secondary | ICD-10-CM

## 2013-01-02 DIAGNOSIS — I214 Non-ST elevation (NSTEMI) myocardial infarction: Secondary | ICD-10-CM

## 2013-01-02 LAB — CBC
Hemoglobin: 13.1 g/dL (ref 13.0–17.0)
MCH: 30.8 pg (ref 26.0–34.0)
Platelets: 180 10*3/uL (ref 150–400)
RBC: 4.26 MIL/uL (ref 4.22–5.81)

## 2013-01-02 LAB — GLUCOSE, CAPILLARY
Glucose-Capillary: 103 mg/dL — ABNORMAL HIGH (ref 70–99)
Glucose-Capillary: 104 mg/dL — ABNORMAL HIGH (ref 70–99)

## 2013-01-02 LAB — BASIC METABOLIC PANEL
BUN: 13 mg/dL (ref 6–23)
Calcium: 9.2 mg/dL (ref 8.4–10.5)
GFR calc Af Amer: 83 mL/min — ABNORMAL LOW (ref >90)
GFR calc non Af Amer: 72 mL/min — ABNORMAL LOW (ref >90)
Glucose, Bld: 97 mg/dL (ref 70–99)
Potassium: 3.9 mEq/L (ref 3.5–5.1)
Sodium: 142 mEq/L (ref 135–145)

## 2013-01-02 LAB — PROTIME-INR
INR: 1.38 (ref 0.00–1.49)
Prothrombin Time: 16.6 seconds — ABNORMAL HIGH (ref 11.6–15.2)

## 2013-01-02 LAB — HEPARIN LEVEL (UNFRACTIONATED): Heparin Unfractionated: 0.43 IU/mL (ref 0.30–0.70)

## 2013-01-02 MED ORDER — AMIODARONE HCL 200 MG PO TABS
400.0000 mg | ORAL_TABLET | Freq: Two times a day (BID) | ORAL | Status: DC
  Administered 2013-01-02 – 2013-01-05 (×7): 400 mg via ORAL
  Filled 2013-01-02 (×8): qty 2

## 2013-01-02 MED ORDER — SODIUM CHLORIDE 0.9 % IJ SOLN
3.0000 mL | Freq: Two times a day (BID) | INTRAMUSCULAR | Status: DC
  Administered 2013-01-02 – 2013-01-03 (×2): 3 mL via INTRAVENOUS

## 2013-01-02 MED ORDER — SODIUM CHLORIDE 0.9 % IJ SOLN: 3.0000 mL | INTRAMUSCULAR | Status: DC | PRN

## 2013-01-02 MED ORDER — SODIUM CHLORIDE 0.9 % IV SOLN: 250.0000 mL | INTRAVENOUS | Status: DC | PRN

## 2013-01-02 NOTE — Progress Notes (Signed)
ANTICOAGULATION CONSULT NOTE - Follow Up Consult  Pharmacy Consult for Heparin Indication: atrial fibrillation  No Known Allergies  Patient Measurements: Height: 6' (182.9 cm) Weight: 259 lb 4.2 oz (117.6 kg) (standing scale) IBW/kg (Calculated) : 77.6 Heparin Dosing Weight:    Vital Signs: Temp: 98.3 F (36.8 C) (09/01 0821) Temp src: Oral (09/01 0821) BP: 113/81 mmHg (09/01 0821) Pulse Rate: 68 (09/01 0821)  Labs:  Recent Labs  12/31/12 0420 12/31/12 1300  01/01/13 0535 01/01/13 1200 01/02/13 0520  HGB  --   --   --  12.9*  --  13.1  HCT  --   --   --  38.7*  --  40.0  PLT  --   --   --  198  --  180  LABPROT 21.8*  --   --  21.3*  --  16.6*  INR 1.97*  --   --  1.91*  --  1.38  HEPARINUNFRC  --   --   < > 0.34 0.47 0.43  CREATININE  --  0.91  --  0.97  --  1.12  TROPONINI  --  1.76*  --   --   --   --   < > = values in this interval not displayed.  Estimated Creatinine Clearance: 97.5 ml/min (by C-G formula based on Cr of 1.12).   Assessment: CC: Pacemaker firing/VT  PMH: CHF, gout, HTN, lipoma, HLD, CAD, paroxysmal VT, OSA, DM, CVA,   AC: Coumadin (on hold for possible cath) for afib - INR 1.38 today, no bleeding noted (PTA 10mg  daily except 15mg  MWF). Heparin level 0.43. CBC stable   Goal of Therapy:  Heparin level 0.3-0.7 units/ml Monitor platelets by anticoagulation protocol: Yes   Plan:  1. Holding Coumadin 2. IV heparin 1750 units/hr   Eilene Ghazi Stillinger 01/02/2013,8:35 AM

## 2013-01-02 NOTE — Progress Notes (Addendum)
Patient ID: [name], male   DOB: Feb 15, 1956, 57 y.o.   MRN: [id number]    SUBJECTIVE:   SOB is much improved.  Remains on amio gtt. No further ICD shocks. Troponin trending down from 5.5.  On tele SR with PVCs. NSVT observed  . allopurinol  300 mg Oral Daily  . amLODipine  10 mg Oral Daily  . aspirin  81 mg Oral Daily  . atorvastatin  40 mg Oral Daily  . carvedilol  50 mg Oral BID WC  . furosemide  80 mg Oral BID  . hydrALAZINE  50 mg Oral Q8H  . insulin aspart  0-15 Units Subcutaneous TID WC  . insulin glargine  10 Units Subcutaneous QHS  . isosorbide dinitrate  20 mg Oral TID  . potassium chloride SA  40 mEq Oral TID  . ramipril  10 mg Oral BID  . sodium chloride  3 mL Intravenous Q12H  . spironolactone  25 mg Oral Daily  amiodarone gtt   Filed Vitals:   01/01/13 2331 01/02/13 0436 01/02/13 0500 01/02/13 0821  BP: 126/85 132/92  113/81  Pulse: 73 73  68  Temp: 98.6 F (37 C) 98.2 F (36.8 C)  98.3 F (36.8 C)  TempSrc: Oral Oral  Oral  Resp: 26 18  22   Height:      Weight:   259 lb 4.2 oz (117.6 kg)   SpO2: 97% 98%  99%    Intake/Output Summary (Last 24 hours) at 01/02/13 0944 Last data filed at 01/02/13 0900  Gross per 24 hour  Intake 1180.8 ml  Output   2225 ml  Net -1044.2 ml    LABS: Basic Metabolic Panel:  Recent Labs  01/01/13 0535 01/02/13 0520  NA 139 142  K 3.2* 3.9  CL 106 106  CO2 25 20  GLUCOSE 102* 97  BUN 10 13  CREATININE 0.97 1.12  CALCIUM 8.7 9.2  MG  --  1.8   Liver Function Tests: No results found for this basename: AST, ALT, ALKPHOS, BILITOT, PROT, ALBUMIN,  in the last 72 hours No results found for this basename: LIPASE, AMYLASE,  in the last 72 hours CBC:  Recent Labs  01/01/13 0535 01/02/13 0520  WBC 5.7 7.1  HGB 12.9* 13.1  HCT 38.7* 40.0  MCV 92.1 93.9  PLT 198 180   Cardiac Enzymes:  Recent Labs  12/31/12 1300  TROPONINI 1.76*    PHYSICAL EXAM General: NAD OP clear Neck: JVP 6 cm, no  thyromegaly or thyroid nodule.  Lungs: Clear to auscultation bilaterally with normal respiratory effort. CV: Nondisplaced PMI.  Heart regular S1/S2, no S3/S4, no murmur.  No peripheral edema.   Abdomen: Soft, nontender, no hepatosplenomegaly, no distention.  Neurologic: Alert and oriented x 3.  Psych: Normal affect. Extremities: No clubbing or cyanosis.   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   1. VT: VT storm, most likely due to acute on chronic systolic CHF/volume overload.  Will convert to PO amiodarone No driving x 6 months 2. Elevated troponin. Suspect this is due to multiple ICD shocks and volume overload, but no recent cath (last 2009).  Per Dr. Tanna Furry note, will hold coumadin and use heparin gtt.   Will proceed with LHC in am to better assess coronary status.  Continue on heparin drip for now. 3. Acute on chronic systolic CHF: On good medical  regimen.  Volume status improved. He is back on po lasix.  Per Dr Haroldine Laws, CPX study post discharge to further risk stratify CHF is encouraged.  Dr Percival Spanish can see post discharge and make this decision  Will proceed with RHC/ and LHC in am. 4. Hypokalemia - better 5. afib- on heparin gtt, resume coumadin post cath  Will transfer to telemetry tomorrow post cath if VT remains controlled on oral amiodarone.  [name] 01/02/2013 9:44 AM

## 2013-01-03 ENCOUNTER — Encounter (HOSPITAL_COMMUNITY)
Admission: EM | Disposition: A | Discharge status: Home / Self Care | Payer: Self-pay | Source: Home / Self Care | Attending: Cardiology

## 2013-01-03 DIAGNOSIS — I2510 Atherosclerotic heart disease of native coronary artery without angina pectoris: Secondary | ICD-10-CM

## 2013-01-03 HISTORY — PX: LEFT AND RIGHT HEART CATHETERIZATION WITH CORONARY ANGIOGRAM: SHX5449

## 2013-01-03 LAB — POCT I-STAT 3, ART BLOOD GAS (G3+)
Bicarbonate: 25.5 mEq/L — ABNORMAL HIGH (ref 20.0–24.0)
TCO2: 27 mmol/L (ref 0–100)
pH, Arterial: 7.438 (ref 7.350–7.450)
pO2, Arterial: 59 mmHg — ABNORMAL LOW (ref 80.0–100.0)

## 2013-01-03 LAB — CBC
HCT: 40.1 % (ref 39.0–52.0)
Hemoglobin: 13 g/dL (ref 13.0–17.0)
RBC: 4.28 MIL/uL (ref 4.22–5.81)
WBC: 6.1 10*3/uL (ref 4.0–10.5)

## 2013-01-03 LAB — BASIC METABOLIC PANEL
CO2: 27 mEq/L (ref 19–32)
Chloride: 105 mEq/L (ref 96–112)
GFR calc Af Amer: 80 mL/min — ABNORMAL LOW (ref >90)
Sodium: 141 mEq/L (ref 135–145)

## 2013-01-03 LAB — GLUCOSE, CAPILLARY
Glucose-Capillary: 95 mg/dL (ref 70–99)
Glucose-Capillary: 95 mg/dL (ref 70–99)
Glucose-Capillary: 95 mg/dL (ref 70–99)

## 2013-01-03 LAB — PROTIME-INR
INR: 1.32 (ref 0.00–1.49)
Prothrombin Time: 16.1 seconds — ABNORMAL HIGH (ref 11.6–15.2)

## 2013-01-03 LAB — POCT I-STAT 3, VENOUS BLOOD GAS (G3P V)
Acid-Base Excess: 1 mmol/L (ref 0.0–2.0)
Bicarbonate: 25.8 mEq/L — ABNORMAL HIGH (ref 20.0–24.0)
O2 Saturation: 62 %
TCO2: 27 mmol/L (ref 0–100)
pO2, Ven: 32 mmHg (ref 30.0–45.0)

## 2013-01-03 LAB — HEPARIN LEVEL (UNFRACTIONATED): Heparin Unfractionated: 0.69 IU/mL (ref 0.30–0.70)

## 2013-01-03 SURGERY — LEFT AND RIGHT HEART CATHETERIZATION WITH CORONARY ANGIOGRAM
Anesthesia: LOCAL

## 2013-01-03 MED ORDER — WARFARIN SODIUM 10 MG PO TABS
10.0000 mg | ORAL_TABLET | Freq: Once | ORAL | Status: AC
  Administered 2013-01-03: 10 mg via ORAL
  Filled 2013-01-03: qty 1

## 2013-01-03 MED ORDER — SODIUM CHLORIDE 0.9 % IV SOLN: INTRAVENOUS | Status: AC

## 2013-01-03 MED ORDER — HEPARIN (PORCINE) IN NACL 2-0.9 UNIT/ML-% IJ SOLN
INTRAMUSCULAR | Status: AC
  Filled 2013-01-03: qty 1000

## 2013-01-03 MED ORDER — HEPARIN (PORCINE) IN NACL 100-0.45 UNIT/ML-% IJ SOLN
1700.0000 [IU]/h | INTRAMUSCULAR | Status: DC
  Filled 2013-01-03 (×2): qty 250

## 2013-01-03 MED ORDER — NITROGLYCERIN 0.2 MG/ML ON CALL CATH LAB
INTRAVENOUS | Status: AC
  Filled 2013-01-03: qty 1

## 2013-01-03 MED ORDER — LIDOCAINE HCL (PF) 1 % IJ SOLN
INTRAMUSCULAR | Status: AC
  Filled 2013-01-03: qty 30

## 2013-01-03 MED ORDER — MIDAZOLAM HCL 2 MG/2ML IJ SOLN
INTRAMUSCULAR | Status: AC
  Filled 2013-01-03: qty 2

## 2013-01-03 MED ORDER — FENTANYL CITRATE 0.05 MG/ML IJ SOLN
INTRAMUSCULAR | Status: AC
  Filled 2013-01-03: qty 2

## 2013-01-03 MED ORDER — HEPARIN (PORCINE) IN NACL 100-0.45 UNIT/ML-% IJ SOLN
1700.0000 [IU]/h | INTRAMUSCULAR | Status: DC
  Administered 2013-01-03 – 2013-01-05 (×3): 1700 [IU]/h via INTRAVENOUS
  Filled 2013-01-03 (×4): qty 250

## 2013-01-03 MED ORDER — WARFARIN - PHARMACIST DOSING INPATIENT: Freq: Every day | Status: DC

## 2013-01-03 NOTE — CV Procedure (Signed)
   Cardiac Catheterization Procedure Note  Name: [name] MRN: [id number] DOB: [birth year]  Procedure: Right Heart Cath, Left Heart Cath, Selective Coronary Angiography, LV angiography  Indication: 57 yo BM with history of nonischemic cardiomyopathy presents with VT storm and acute on chronic systolic CHF.   Procedural Details: The right groin was prepped, draped, and anesthetized with 1% lidocaine. Using the modified Seldinger technique a 5 French sheath was placed in the right femoral artery and a 7 French sheath was placed in the right femoral vein. A Swan-Ganz catheter was used for the right heart catheterization. Standard protocol was followed for recording of right heart pressures and sampling of oxygen saturations. Fick cardiac output was calculated. Standard Judkins catheters were used for selective coronary angiography and left ventriculography. There were no immediate procedural complications. The patient was transferred to the post catheterization recovery area for further monitoring.  Procedural Findings: Hemodynamics RA 9/7 mean 7 mm Hg RV 46/7 mm Hg PA 45/16 mean 30 mm Hg PCWP 25/25 mean 23 mm Hg LV 113/16 mm Hg AO 115/79 mean 95 mm Hg  Oxygen saturations: PA 62% AO 91%  Cardiac Output (Fick) 6.15 L/min  Cardiac Index (Fick) 2.59 L/min/m2   Coronary angiography: Coronary dominance: left  Left mainstem: Very large. Normal.  Left anterior descending (LAD): Large vessel that wraps around the apex and supplies the inferior wall. Normal.  Left circumflex (LCx): Large vessel. There is a focal 80-90% stenosis in the distal LCx following the last OM. This branch terminates in the AV groove.   Right coronary artery (RCA): Nondominant, 50-60% mid vessel.  Left ventriculography: Left ventricular size is markedly increased with severe global hypokinesis and inferior wall dyssynergy. Overall EF is estimated at 15-20%.  Final Conclusions:   1. Single vessel obstructive  CAD involving the distal LCx. This vessel is small and terminates in the AV groove. It is unlikely to cause significant ischemia or electrical instability. 2. Marked LV dysfunction. 3. Mild pulmonary HTN.  Recommendations: Medical management.   Collier Salina [facility] 01/03/2013, 5:18 PM

## 2013-01-03 NOTE — Progress Notes (Signed)
ANTICOAGULATION CONSULT NOTE - Follow Up Consult  Pharmacy Consult for Heparin Indication: atrial fibrillation  No Known Allergies  Patient Measurements: Height: 6' (182.9 cm) Weight: 257 lb 8 oz (116.8 kg) (standing scale) IBW/kg (Calculated) : 77.6 Heparin Dosing Weight:    Vital Signs: Temp: 98.4 F (36.9 C) (09/02 0744) Temp src: Oral (09/02 0744) BP: 129/85 mmHg (09/02 0744) Pulse Rate: 71 (09/02 0744)  Labs:  Recent Labs  12/31/12 1300  01/01/13 0535 01/01/13 1200 01/02/13 0520 01/03/13 0415  HGB  --   < > 12.9*  --  13.1 13.0  HCT  --   --  38.7*  --  40.0 40.1  PLT  --   --  198  --  180 198  LABPROT  --   --  21.3*  --  16.6* 16.1*  INR  --   --  1.91*  --  1.38 1.32  HEPARINUNFRC  --   < > 0.34 0.47 0.43 0.69  CREATININE 0.91  --  0.97  --  1.12 1.15  TROPONINI 1.76*  --   --   --   --   --   < > = values in this interval not displayed.  Estimated Creatinine Clearance: 94.7 ml/min (by C-G formula based on Cr of 1.15).   . heparin 1,750 Units/hr (01/03/13 0541)   Assessment: CC: Pacemaker firing/VT  PMH: CHF, gout, HTN, lipoma, HLD, CAD, paroxysmal VT, OSA, DM, CVA,   AC: Coumadin (on hold for possible cath) for afib - INR 1.32 today, no bleeding noted (PTA 10mg  daily except 15mg  MWF). Heparin level trending up to 0.69. CBC stable   Goal of Therapy:  Heparin level 0.3-0.7 units/ml Monitor platelets by anticoagulation protocol: Yes   Plan:  1. Decrease IV heparin to 1700 units/hr.  Recheck heparin level and CBC in AM. 2. Continue to hold Coumadin. 3. F/U plans to resume oral anticoagulants after cath today. 4. Daily CBC and heparin level.  Uvaldo Rising, BCPS  Clinical Pharmacist Pager [phone]  01/03/2013 9:14 AM

## 2013-01-03 NOTE — Interval H&P Note (Signed)
History and Physical Interval Note:  01/03/2013 4:44 PM  [name]  has presented today for surgery, with the diagnosis of cp  The various methods of treatment have been discussed with the patient and family. After consideration of risks, benefits and other options for treatment, the patient has consented to  Procedure(s): LEFT AND RIGHT HEART CATHETERIZATION WITH CORONARY ANGIOGRAM (N/A) as a surgical intervention .  The patient's history has been reviewed, patient examined, no change in status, stable for surgery.  I have reviewed the patient's chart and labs.  Questions were answered to the patient's satisfaction.    Cath Lab Visit (complete for each Cath Lab visit)  Clinical Evaluation Leading to the Procedure:   ACS: no  Non-ACS:    Anginal Classification: CCS III  Anti-ischemic medical therapy: Maximal Therapy (2 or more classes of medications)  Non-Invasive Test Results: No non-invasive testing performed  Prior CABG: No previous CABG       Collier Salina [facility] 01/03/2013 4:45 PM

## 2013-01-03 NOTE — Progress Notes (Signed)
Subjective: No CP  No SOB Objective: Filed Vitals:   01/03/13 0027 01/03/13 0417 01/03/13 0544 01/03/13 0744  BP: 118/76 130/86  129/85  Pulse: 66 67  71  Temp: 98 F (36.7 C) 98 F (36.7 C)  98.4 F (36.9 C)  TempSrc: Oral Oral  Oral  Resp: 21 15  14   Height:      Weight:   257 lb 8 oz (116.8 kg)   SpO2: 96% 95%  96%   Weight change: -8 oz (-0.228 kg)  Intake/Output Summary (Last 24 hours) at 01/03/13 0902 Last data filed at 01/03/13 0803  Gross per 24 hour  Intake 1081.7 ml  Output   2900 ml  Net -1818.3 ml    General: Alert, awake, oriented x3, in no acute distress Neck:  JVP is normal Heart: Regular rate and rhythm, without murmurs, rubs, gallops.  Lungs: Clear to auscultation.  No rales or wheezes. Exemities:  No edema.   Neuro: Grossly intact, nonfocal.  Tele:  SR with PVCs, few short bursts NSVT Lab Results: Results for orders placed during the hospital encounter of 12/29/12 (from the past 24 hour(s))  GLUCOSE, CAPILLARY     Status: Abnormal   Collection Time    01/02/13 11:51 AM      Result Value Range   Glucose-Capillary 141 (*) 70 - 99 mg/dL  GLUCOSE, CAPILLARY     Status: Abnormal   Collection Time    01/02/13  5:14 PM      Result Value Range   Glucose-Capillary 104 (*) 70 - 99 mg/dL   Comment 1 Notify RN    GLUCOSE, CAPILLARY     Status: Abnormal   Collection Time    01/02/13  8:44 PM      Result Value Range   Glucose-Capillary 105 (*) 70 - 99 mg/dL  HEPARIN LEVEL (UNFRACTIONATED)     Status: None   Collection Time    01/03/13  4:15 AM      Result Value Range   Heparin Unfractionated 0.69  0.30 - 0.70 IU/mL  CBC     Status: None   Collection Time    01/03/13  4:15 AM      Result Value Range   WBC 6.1  4.0 - 10.5 K/uL   RBC 4.28  4.22 - 5.81 MIL/uL   Hemoglobin 13.0  13.0 - 17.0 g/dL   HCT 40.1  39.0 - 52.0 %   MCV 93.7  78.0 - 100.0 fL   MCH 30.4  26.0 - 34.0 pg   MCHC 32.4  30.0 - 36.0 g/dL   RDW 13.7  11.5 - 15.5 %   Platelets 198   150 - 400 K/uL  PROTIME-INR     Status: Abnormal   Collection Time    01/03/13  4:15 AM      Result Value Range   Prothrombin Time 16.1 (*) 11.6 - 15.2 seconds   INR 1.32  0.00 - 99991111  BASIC METABOLIC PANEL     Status: Abnormal   Collection Time    01/03/13  4:15 AM      Result Value Range   Sodium 141  135 - 145 mEq/L   Potassium 3.6  3.5 - 5.1 mEq/L   Chloride 105  96 - 112 mEq/L   CO2 27  19 - 32 mEq/L   Glucose, Bld 97  70 - 99 mg/dL   BUN 12  6 - 23 mg/dL   Creatinine, Ser 1.15  0.50 - 1.35  mg/dL   Calcium 9.3  8.4 - 10.5 mg/dL   GFR calc non Af Amer 69 (*) >90 mL/min   GFR calc Af Amer 80 (*) >90 mL/min  GLUCOSE, CAPILLARY     Status: None   Collection Time    01/03/13  8:01 AM      Result Value Range   Glucose-Capillary 95  70 - 99 mg/dL    Studies/Results: @RISRSLT24 @  Medications: reviewed   @PROBHOSP @  57 yo with 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  1. VT: VT storm, most likely due to acute on chronic systolic CHF/volume overload.   No driving x 6 months  2. Elevated troponin. Suspect this is due to multiple ICD shocks and volume overload, but no recent cath (last 2009).  Will proceed with LHC  to better assess coronary status. Continue on heparin drip for now.  3. Acute on chronic systolic CHF: On good medical regimen. Volume status improved. He is back on po lasix. Per Dr Haroldine Laws, CPX study post discharge to further risk stratify CHF is encouraged. Dr Percival Spanish can see post discharge and make this decision Will proceed with RHC/ and LHC in am.  4. Hypokalemia - better  5. afib- on heparin gtt, resume coumadin post cath   LOS: 5 days   Dorris Carnes 01/03/2013, 9:02 AM

## 2013-01-03 NOTE — H&P (View-Only) (Signed)
Subjective: No CP  No SOB Objective: Filed Vitals:   01/03/13 0027 01/03/13 0417 01/03/13 0544 01/03/13 0744  BP: 118/76 130/86  129/85  Pulse: 66 67  71  Temp: 98 F (36.7 C) 98 F (36.7 C)  98.4 F (36.9 C)  TempSrc: Oral Oral  Oral  Resp: 21 15  14   Height:      Weight:   257 lb 8 oz (116.8 kg)   SpO2: 96% 95%  96%   Weight change: -8 oz (-0.228 kg)  Intake/Output Summary (Last 24 hours) at 01/03/13 0902 Last data filed at 01/03/13 0803  Gross per 24 hour  Intake 1081.7 ml  Output   2900 ml  Net -1818.3 ml    General: Alert, awake, oriented x3, in no acute distress Neck:  JVP is normal Heart: Regular rate and rhythm, without murmurs, rubs, gallops.  Lungs: Clear to auscultation.  No rales or wheezes. Exemities:  No edema.   Neuro: Grossly intact, nonfocal.  Tele:  SR with PVCs, few short bursts NSVT Lab Results: Results for orders placed during the hospital encounter of 12/29/12 (from the past 24 hour(s))  GLUCOSE, CAPILLARY     Status: Abnormal   Collection Time    01/02/13 11:51 AM      Result Value Range   Glucose-Capillary 141 (*) 70 - 99 mg/dL  GLUCOSE, CAPILLARY     Status: Abnormal   Collection Time    01/02/13  5:14 PM      Result Value Range   Glucose-Capillary 104 (*) 70 - 99 mg/dL   Comment 1 Notify RN    GLUCOSE, CAPILLARY     Status: Abnormal   Collection Time    01/02/13  8:44 PM      Result Value Range   Glucose-Capillary 105 (*) 70 - 99 mg/dL  HEPARIN LEVEL (UNFRACTIONATED)     Status: None   Collection Time    01/03/13  4:15 AM      Result Value Range   Heparin Unfractionated 0.69  0.30 - 0.70 IU/mL  CBC     Status: None   Collection Time    01/03/13  4:15 AM      Result Value Range   WBC 6.1  4.0 - 10.5 K/uL   RBC 4.28  4.22 - 5.81 MIL/uL   Hemoglobin 13.0  13.0 - 17.0 g/dL   HCT 40.1  39.0 - 52.0 %   MCV 93.7  78.0 - 100.0 fL   MCH 30.4  26.0 - 34.0 pg   MCHC 32.4  30.0 - 36.0 g/dL   RDW 13.7  11.5 - 15.5 %   Platelets 198   150 - 400 K/uL  PROTIME-INR     Status: Abnormal   Collection Time    01/03/13  4:15 AM      Result Value Range   Prothrombin Time 16.1 (*) 11.6 - 15.2 seconds   INR 1.32  0.00 - 99991111  BASIC METABOLIC PANEL     Status: Abnormal   Collection Time    01/03/13  4:15 AM      Result Value Range   Sodium 141  135 - 145 mEq/L   Potassium 3.6  3.5 - 5.1 mEq/L   Chloride 105  96 - 112 mEq/L   CO2 27  19 - 32 mEq/L   Glucose, Bld 97  70 - 99 mg/dL   BUN 12  6 - 23 mg/dL   Creatinine, Ser 1.15  0.50 - 1.35  mg/dL   Calcium 9.3  8.4 - 10.5 mg/dL   GFR calc non Af Amer 69 (*) >90 mL/min   GFR calc Af Amer 80 (*) >90 mL/min  GLUCOSE, CAPILLARY     Status: None   Collection Time    01/03/13  8:01 AM      Result Value Range   Glucose-Capillary 95  70 - 99 mg/dL    Studies/Results: @RISRSLT24 @  Medications: reviewed   @PROBHOSP @  57 yo with 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  1. VT: VT storm, most likely due to acute on chronic systolic CHF/volume overload.   No driving x 6 months  2. Elevated troponin. Suspect this is due to multiple ICD shocks and volume overload, but no recent cath (last 2009).  Will proceed with LHC  to better assess coronary status. Continue on heparin drip for now.  3. Acute on chronic systolic CHF: On good medical regimen. Volume status improved. He is back on po lasix. Per Dr Haroldine Laws, CPX study post discharge to further risk stratify CHF is encouraged. Dr Percival Spanish can see post discharge and make this decision Will proceed with RHC/ and LHC in am.  4. Hypokalemia - better  5. afib- on heparin gtt, resume coumadin post cath   LOS: 5 days   [name] 01/03/2013, 9:02 AM

## 2013-01-03 NOTE — Progress Notes (Signed)
Patient refused to wear CPAP tonight.  Was told if he changed his mind to call RT.

## 2013-01-03 NOTE — Progress Notes (Signed)
ANTICOAGULATION CONSULT NOTE - Initial Consult  Pharmacy Consult for warfarin and heparin Indication: atrial fibrillation  No Known Allergies  Patient Measurements: Height: 6' (182.9 cm) Weight: 257 lb 8 oz (116.8 kg) (standing scale) IBW/kg (Calculated) : 77.6 Heparin Dosing Weight: 103kg   Vital Signs: Temp: 97.6 F (36.4 C) (09/02 1557) Temp src: Oral (09/02 1557) BP: 114/57 mmHg (09/02 1557) Pulse Rate: 68 (09/02 1638)  Labs:  Recent Labs  01/01/13 0535 01/01/13 1200 01/02/13 0520 01/03/13 0415  HGB 12.9*  --  13.1 13.0  HCT 38.7*  --  40.0 40.1  PLT 198  --  180 198  LABPROT 21.3*  --  16.6* 16.1*  INR 1.91*  --  1.38 1.32  HEPARINUNFRC 0.34 0.47 0.43 0.69  CREATININE 0.97  --  1.12 1.15    Estimated Creatinine Clearance: 94.7 ml/min (by C-G formula based on Cr of 1.15).   Medical History: Past Medical History  Diagnosis Date  . Chronic systolic CHF (congestive heart failure)     a. Likely NICM (out of proportion to CAD). b. 2009 - EF 25-30% by echo. c. s/p St. Jude ICD implantation 2007.  [name] Gout   . Hypokalemia   . HTN (hypertension)   . Lipoma   . Dyslipidemia   . CAD (coronary artery disease)     a. Nonobstructive by cath 2009.  [name] Atrial fibrillation     a. Noted 05/2008 by EKG.  . Paroxysmal VT     a. s/p St. Jude ICD 2007. b. H/o paroxysmal VT/VF.  [name] Nonischemic cardiomyopathy   . ICD (implantable cardiac defibrillator) in place   . OSA (obstructive sleep apnea)     USES cpap  . DM (diabetes mellitus)     insulin dependent  . Cerebrovascular accident     a. 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 CVA (cerebral infarction)     Medications:  Scheduled:  . allopurinol  300 mg Oral Daily  . amiodarone  400 mg Oral BID  . amLODipine  10 mg Oral Daily  . aspirin  81 mg Oral Daily  . atorvastatin  40 mg Oral Daily  . carvedilol  50 mg Oral BID WC  . furosemide  80 mg Oral BID  . hydrALAZINE  50 mg Oral Q8H  . insulin aspart  0-15 Units  Subcutaneous TID WC  . insulin glargine  10 Units Subcutaneous QHS  . isosorbide dinitrate  20 mg Oral TID  . potassium chloride SA  40 mEq Oral TID  . ramipril  10 mg Oral BID  . sodium chloride  3 mL Intravenous Q12H  . spironolactone  25 mg Oral Daily    Assessment: 40 YOM who is now s/p cath which revealed single obstructive CAD involving the distal LCx which is recommended to be medically managed. Patient was on heparin pre-cath and is now to resume warfarin post-cath. His PTA dose of warfarin was 10mg  daily except 15mg  MWF. Most recent INR is from this morning and was 1.32. CBC is WNL and no bleeding noted post-cath. Discussed with Dr. Burt Knack and he gave me a verbal order to start heparin without a bolus as a bridge tonight as patient has CHADS2 score of 5 and history of stroke. Patient was previously therapeutic on 1750units/hr, but last level before cath was starting to increase. No excessive bleeding noted per chart review post-cath.  Goal of Therapy:  INR 2-3 Heparin level 0.3-0.7 units/ml Monitor platelets by anticoagulation protocol: Yes   Plan:  1. Warfarin 10mg  po x1 tonight 2. Daily PT/INR 3. Start Heparin at 1700units/hr at 2330 which will be about 6 hours after sheath removal 4. Heparin level in 6 hours after heparin is restarted 5. Follow for any signs/symptoms of bleeding  Jaydrien Wassenaar D. Joshawn Crissman, PharmD Clinical Pharmacist Pager: [phone] 01/03/2013 6:37 PM

## 2013-01-04 ENCOUNTER — Ambulatory Visit: Payer: Medicare Other | Admitting: Cardiology

## 2013-01-04 LAB — CBC
HCT: 40.3 % (ref 39.0–52.0)
MCH: 31.1 pg (ref 26.0–34.0)
MCV: 94.2 fL (ref 78.0–100.0)
Platelets: 197 10*3/uL (ref 150–400)
RBC: 4.28 MIL/uL (ref 4.22–5.81)
RDW: 13.9 % (ref 11.5–15.5)

## 2013-01-04 LAB — GLUCOSE, CAPILLARY
Glucose-Capillary: 85 mg/dL (ref 70–99)
Glucose-Capillary: 116 mg/dL — ABNORMAL HIGH (ref 70–99)
Glucose-Capillary: 91 mg/dL (ref 70–99)

## 2013-01-04 LAB — BASIC METABOLIC PANEL
BUN: 16 mg/dL (ref 6–23)
CO2: 27 mEq/L (ref 19–32)
GFR calc non Af Amer: 58 mL/min — ABNORMAL LOW (ref >90)
Glucose, Bld: 90 mg/dL (ref 70–99)
Potassium: 4.3 mEq/L (ref 3.5–5.1)
Sodium: 145 mEq/L (ref 135–145)

## 2013-01-04 MED ORDER — WARFARIN SODIUM 7.5 MG PO TABS
15.0000 mg | ORAL_TABLET | Freq: Once | ORAL | Status: AC
  Administered 2013-01-04: 15 mg via ORAL
  Filled 2013-01-04: qty 2

## 2013-01-04 MED ORDER — WARFARIN SODIUM 10 MG PO TABS
10.0000 mg | ORAL_TABLET | Freq: Once | ORAL | Status: DC
  Filled 2013-01-04: qty 1

## 2013-01-04 NOTE — Progress Notes (Signed)
Received in report that pt has been refusing to wean CPAP at night.

## 2013-01-04 NOTE — Progress Notes (Signed)
Subjective: No CP  No SOB Objective: Filed Vitals:   01/03/13 2115 01/03/13 2215 01/04/13 0410 01/04/13 0548  BP: 112/79 120/73 113/78   Pulse:  62 69   Temp:   98.6 F (37 C)   TempSrc:   Oral   Resp: 18 16 18    Height:      Weight:    256 lb 9.9 oz (116.4 kg)  SpO2: 99% 100% 97%    Weight change: -14.1 oz (-0.4 kg)  Intake/Output Summary (Last 24 hours) at 01/04/13 0727 Last data filed at 01/04/13 0410  Gross per 24 hour  Intake 497.27 ml  Output   2125 ml  Net -1627.73 ml    General: Alert, awake, oriented x3, in no acute distress Neck:  JVP is normal Heart: Regular rate and rhythm, without murmurs, rubs, gallops.  Lungs: Clear to auscultation.  No rales or wheezes. Exemities:  No edema.  Right groin with no hematoma and no bruit Neuro: Grossly intact, nonfocal.  Tele:  SR with PVCs, few short bursts NSVT Lab Results: Results for orders placed during the hospital encounter of 12/29/12 (from the past 24 hour(s))  GLUCOSE, CAPILLARY     Status: None   Collection Time    01/03/13  8:01 AM      Result Value Range   Glucose-Capillary 95  70 - 99 mg/dL  GLUCOSE, CAPILLARY     Status: None   Collection Time    01/03/13 11:37 AM      Result Value Range   Glucose-Capillary 95  70 - 99 mg/dL  GLUCOSE, CAPILLARY     Status: None   Collection Time    01/03/13  3:59 PM      Result Value Range   Glucose-Capillary 85  70 - 99 mg/dL  POCT I-STAT 3, BLOOD GAS (G3+)     Status: Abnormal   Collection Time    01/03/13  4:58 PM      Result Value Range   pH, Arterial 7.438  7.350 - 7.450   pCO2 arterial 37.8  35.0 - 45.0 mmHg   pO2, Arterial 59.0 (*) 80.0 - 100.0 mmHg   Bicarbonate 25.5 (*) 20.0 - 24.0 mEq/L   TCO2 27  0 - 100 mmol/L   O2 Saturation 91.0     Acid-Base Excess 1.0  0.0 - 2.0 mmol/L   Sample type ARTERIAL    POCT I-STAT 3, BLOOD GAS (G3P V)     Status: Abnormal   Collection Time    01/03/13  5:00 PM      Result Value Range   pH, Ven 7.398 (*) 7.250 - 7.300    pCO2, Ven 41.9 (*) 45.0 - 50.0 mmHg   pO2, Ven 32.0  30.0 - 45.0 mmHg   Bicarbonate 25.8 (*) 20.0 - 24.0 mEq/L   TCO2 27  0 - 100 mmol/L   O2 Saturation 62.0     Acid-Base Excess 1.0  0.0 - 2.0 mmol/L   Sample type MIXED VENOUS SAMPLE     Comment NOTIFIED PHYSICIAN    POCT ACTIVATED CLOTTING TIME     Status: None   Collection Time    01/03/13  5:14 PM      Result Value Range   Activated Clotting Time 165    GLUCOSE, CAPILLARY     Status: None   Collection Time    01/03/13  5:30 PM      Result Value Range   Glucose-Capillary 95  70 - 99 mg/dL  GLUCOSE, CAPILLARY     Status: Abnormal   Collection Time    01/03/13  9:41 PM      Result Value Range   Glucose-Capillary 203 (*) 70 - 99 mg/dL   Comment 1 Notify RN    HEPARIN LEVEL (UNFRACTIONATED)     Status: None   Collection Time    01/04/13  5:01 AM      Result Value Range   Heparin Unfractionated 0.36  0.30 - 0.70 IU/mL  CBC     Status: None   Collection Time    01/04/13  5:01 AM      Result Value Range   WBC 6.7  4.0 - 10.5 K/uL   RBC 4.28  4.22 - 5.81 MIL/uL   Hemoglobin 13.3  13.0 - 17.0 g/dL   HCT 40.3  39.0 - 52.0 %   MCV 94.2  78.0 - 100.0 fL   MCH 31.1  26.0 - 34.0 pg   MCHC 33.0  30.0 - 36.0 g/dL   RDW 13.9  11.5 - 15.5 %   Platelets 197  150 - 400 K/uL  PROTIME-INR     Status: None   Collection Time    01/04/13  5:01 AM      Result Value Range   Prothrombin Time 14.7  11.6 - 15.2 seconds   INR 1.17  0.00 - 99991111  BASIC METABOLIC PANEL     Status: Abnormal   Collection Time    01/04/13  5:01 AM      Result Value Range   Sodium 145  135 - 145 mEq/L   Potassium 4.3  3.5 - 5.1 mEq/L   Chloride 109  96 - 112 mEq/L   CO2 27  19 - 32 mEq/L   Glucose, Bld 90  70 - 99 mg/dL   BUN 16  6 - 23 mg/dL   Creatinine, Ser 1.33  0.50 - 1.35 mg/dL   Calcium 9.1  8.4 - 10.5 mg/dL   GFR calc non Af Amer 58 (*) >90 mL/min   GFR calc Af Amer 68 (*) >90 mL/min  GLUCOSE, CAPILLARY     Status: None   Collection Time     01/04/13  7:18 AM      Result Value Range   Glucose-Capillary 85  70 - 99 mg/dL    Studies/Results: @RISRSLT24 @  Medications: reviewed   @PROBHOSP @  57 yo with 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  1. VT: VT storm, most likely due to acute on chronic systolic CHF/volume overload. Continue amiodarone and beta blocker. No driving x 6 months  2. Elevated troponin. Suspect this is due to multiple ICD shocks and volume overload; cath results noted; plan medical therapy. 3. Acute on chronic systolic CHF: On good medical regimen. Volume status improved. He is back on po lasix. Per Dr Haroldine Laws, CPX study post discharge to further risk stratify CHF is encouraged. Dr Percival Spanish can see post discharge and make this decision  4. Hypokalemia - improved 5. afib- on heparin gtt; coumadin resumed. Ambulate today; possible DC in AM if stable (patient with h/o CVA remotely and also PAF; however, he is in sinus and therefore risk of embolic event in short term small)   LOS: 6 days   Kirk Ruths 01/04/2013, 7:27 AM

## 2013-01-04 NOTE — Progress Notes (Addendum)
ANTICOAGULATION CONSULT NOTE - Follow-up Consult  Pharmacy Consult for warfarin and heparin Indication: atrial fibrillation  No Known Allergies  Patient Measurements: Height: 6' (182.9 cm) Weight: 256 lb 9.9 oz (116.4 kg) IBW/kg (Calculated) : 77.6 Heparin Dosing Weight: 103kg   Vital Signs: Temp: 98.6 F (37 C) (09/03 0410) Temp src: Oral (09/03 0410) BP: 113/78 mmHg (09/03 0410) Pulse Rate: 69 (09/03 0410)  Labs:  Recent Labs  01/02/13 0520 01/03/13 0415 01/04/13 0501  HGB 13.1 13.0 13.3  HCT 40.0 40.1 40.3  PLT 180 198 197  LABPROT 16.6* 16.1* 14.7  INR 1.38 1.32 1.17  HEPARINUNFRC 0.43 0.69 0.36  CREATININE 1.12 1.15  --     Estimated Creatinine Clearance: 94.4 ml/min (by C-G formula based on Cr of 1.15).  Assessment: 13 YOM who is now s/p cath which revealed single obstructive CAD involving the distal LCx which is recommended to be medically managed. Patient's warfarin was resumed post-cath. INR remains subtherapeutic and still trending down after dose given yesterday. His PTA dose of warfarin was 10mg  daily except 15mg  MWF. Pt on therapeutic heparin bridge. No bleeding noted. CBC stable.  Goal of Therapy:  INR 2-3 Heparin level 0.3-0.7 units/ml Monitor platelets by anticoagulation protocol: Yes   Plan:  1. Warfarin 15mg  po x1 tonight 2. Daily PT/INR, heparin level, CBC 3. Continue heparin at 1700units/hr a  Sherlon Handing, PharmD, BCPS Clinical pharmacist, pager [phone] 01/04/2013 6:03 AM

## 2013-01-05 ENCOUNTER — Encounter (HOSPITAL_COMMUNITY): Payer: Self-pay | Admitting: Physician Assistant

## 2013-01-05 ENCOUNTER — Other Ambulatory Visit: Payer: Self-pay | Admitting: Physician Assistant

## 2013-01-05 DIAGNOSIS — E876 Hypokalemia: Secondary | ICD-10-CM

## 2013-01-05 LAB — CBC
Hemoglobin: 13.1 g/dL (ref 13.0–17.0)
MCH: 30.7 pg (ref 26.0–34.0)
MCHC: 32.6 g/dL (ref 30.0–36.0)
RDW: 13.9 % (ref 11.5–15.5)

## 2013-01-05 LAB — HEPATIC FUNCTION PANEL
Albumin: 3.5 g/dL (ref 3.5–5.2)
Alkaline Phosphatase: 55 U/L (ref 39–117)
Bilirubin, Direct: 0.2 mg/dL (ref 0.0–0.3)
Total Bilirubin: 1 mg/dL (ref 0.3–1.2)

## 2013-01-05 LAB — GLUCOSE, CAPILLARY: Glucose-Capillary: 119 mg/dL — ABNORMAL HIGH (ref 70–99)

## 2013-01-05 LAB — BASIC METABOLIC PANEL
BUN: 15 mg/dL (ref 6–23)
Calcium: 9.4 mg/dL (ref 8.4–10.5)
Creatinine, Ser: 1.26 mg/dL (ref 0.50–1.35)
GFR calc Af Amer: 72 mL/min — ABNORMAL LOW (ref >90)
GFR calc non Af Amer: 62 mL/min — ABNORMAL LOW (ref >90)
Glucose, Bld: 101 mg/dL — ABNORMAL HIGH (ref 70–99)
Potassium: 4.1 mEq/L (ref 3.5–5.1)

## 2013-01-05 LAB — PROTIME-INR: Prothrombin Time: 15.7 seconds — ABNORMAL HIGH (ref 11.6–15.2)

## 2013-01-05 MED ORDER — METFORMIN HCL 500 MG PO TABS: 500.0000 mg | ORAL_TABLET | Freq: Two times a day (BID) | ORAL | Status: DC

## 2013-01-05 MED ORDER — SPIRONOLACTONE 25 MG PO TABS: 25.0000 mg | ORAL_TABLET | Freq: Every day | ORAL | Status: DC

## 2013-01-05 MED ORDER — WARFARIN SODIUM 7.5 MG PO TABS: 15.0000 mg | ORAL_TABLET | ORAL | Status: DC

## 2013-01-05 MED ORDER — WARFARIN SODIUM 10 MG PO TABS
10.0000 mg | ORAL_TABLET | ORAL | Status: DC
  Filled 2013-01-05: qty 1

## 2013-01-05 MED ORDER — AMIODARONE HCL 200 MG PO TABS: ORAL_TABLET | ORAL | Status: DC

## 2013-01-05 NOTE — Progress Notes (Addendum)
Subjective: No CP  No SOB Objective: Filed Vitals:   01/04/13 0410 01/04/13 0548 01/04/13 2135 01/05/13 0510  BP: 113/78  112/79 112/81  Pulse: 69  65 62  Temp: 98.6 F (37 C)  98.1 F (36.7 C) 98.4 F (36.9 C)  TempSrc: Oral  Oral Oral  Resp: 18  18 18   Height:      Weight:  256 lb 9.9 oz (116.4 kg)  255 lb 1.2 oz (115.7 kg)  SpO2: 97%  95% 95%   Weight change: -1 lb 8.7 oz (-0.7 kg)  Intake/Output Summary (Last 24 hours) at 01/05/13 0733 Last data filed at 01/05/13 0517  Gross per 24 hour  Intake    240 ml  Output   2950 ml  Net  -2710 ml    General: Alert, awake, oriented x3, in no acute distress Neck:  JVP is normal Heart: Regular rate and rhythm, without murmurs, rubs, gallops.  Lungs: Clear to auscultation.  No rales or wheezes. Exemities:  No edema.  Right groin with no hematoma and no bruit Neuro: Grossly intact, nonfocal.  Tele:  SR with PVCs, few short bursts NSVT Lab Results: Results for orders placed during the hospital encounter of 12/29/12 (from the past 24 hour(s))  GLUCOSE, CAPILLARY     Status: Abnormal   Collection Time    01/04/13 11:44 AM      Result Value Range   Glucose-Capillary 116 (*) 70 - 99 mg/dL  GLUCOSE, CAPILLARY     Status: None   Collection Time    01/04/13  4:58 PM      Result Value Range   Glucose-Capillary 91  70 - 99 mg/dL  GLUCOSE, CAPILLARY     Status: Abnormal   Collection Time    01/04/13  9:36 PM      Result Value Range   Glucose-Capillary 132 (*) 70 - 99 mg/dL  PROTIME-INR     Status: Abnormal   Collection Time    01/05/13  4:33 AM      Result Value Range   Prothrombin Time 15.7 (*) 11.6 - 15.2 seconds   INR 1.28  0.00 - 99991111  BASIC METABOLIC PANEL     Status: Abnormal   Collection Time    01/05/13  4:33 AM      Result Value Range   Sodium 147 (*) 135 - 145 mEq/L   Potassium 4.1  3.5 - 5.1 mEq/L   Chloride 109  96 - 112 mEq/L   CO2 28  19 - 32 mEq/L   Glucose, Bld 101 (*) 70 - 99 mg/dL   BUN 15  6 - 23 mg/dL    Creatinine, Ser 1.26  0.50 - 1.35 mg/dL   Calcium 9.4  8.4 - 10.5 mg/dL   GFR calc non Af Amer 62 (*) >90 mL/min   GFR calc Af Amer 72 (*) >90 mL/min  CBC     Status: None   Collection Time    01/05/13  4:33 AM      Result Value Range   WBC 5.7  4.0 - 10.5 K/uL   RBC 4.27  4.22 - 5.81 MIL/uL   Hemoglobin 13.1  13.0 - 17.0 g/dL   HCT 40.2  39.0 - 52.0 %   MCV 94.1  78.0 - 100.0 fL   MCH 30.7  26.0 - 34.0 pg   MCHC 32.6  30.0 - 36.0 g/dL   RDW 13.9  11.5 - 15.5 %   Platelets 187  150 -  400 K/uL    Studies/Results: @RISRSLT24 @  Medications: reviewed   @PROBHOSP @  57 yo with 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  1. VT: VT storm, most likely due to acute on chronic systolic CHF/volume overload. Continue amiodarone and beta blocker. Would treat with amiodarone 400 BID for 2 weeks and then 200 mg daily. No driving x 6 months  2. Elevated troponin. Suspect this is due to multiple ICD shocks and volume overload; cath results noted; plan medical therapy. 3. Acute on chronic systolic CHF: On good medical regimen. Volume status improved. He is back on po lasix. Per Dr Haroldine Laws, CPX study post discharge to further risk stratify CHF is encouraged. Dr Percival Spanish can see post discharge and make this decision  4. Hypokalemia - improved 5. afib- on heparin gtt; coumadin resumed. DC today on preadmission dose of coumadin and present meds; Check INR 9/8; would check bmet at that time as well with results to Dr Percival Spanish. (patient with h/o CVA remotely and also PAF; however, he is in sinus and therefore risk of embolic event in short term small). FU Dr Percival Spanish as scheduled this month. > 30 min PA and physician time D2   LOS: 7 days   Kirk Ruths 01/05/2013, 7:33 AM

## 2013-01-05 NOTE — Discharge Summary (Signed)
See progress notes Jullien Granquist  

## 2013-01-05 NOTE — Discharge Summary (Signed)
Discharge Summary   Patient ID: [name] MRN: [id number], DOB/AGE: 05-14-1955 57 y.o. Admit date: 12/29/2012 D/C date:     01/05/2013  Primary Cardiologist: [email or web address]  Primary Discharge Diagnoses:  1. VT storm - amiodarone initiated - prior history of paroxysmal VT/VF, s/p St. Jude ICD 2007 2. Elevated troponin, felt due to ICD shocks - cath as below 3. Acute on chronic systolic CHF - history: likely NICM (out of proportion to CAD), EF 25-30% by echo 2009, 15-20% by cath 01/03/13 4. Hypokalemia 5. Paroxysmal atrial fibrillation, on Coumadin 6. Mild pulm HTN by cath this admission 7. CAD  - obstructive distal Cx disease (small and terminates in the AV groove, unlikely to cause significant ischemia or electrical instability), nonobstructive RCA disease, EF 15-20% by cath 01/03/13  Secondary Discharge Diagnoses:  1. Gout 2. Lipoma 3. HTN  4. Dyslipidemia 5. OSA refused CPAP this admission 6. DM 7. CVA - Basilar CVA 2000. denies deficits  Hospital Course:[name] is a 57 y/o M with history of likely NICM (out of proportion to CAD 2009), paroxysmal VT/VF s/p St. Jude ICD, HTN, HL, DM, atrial fibrillation, OSA who presented to [facility] 12/29/2012 with multiple ICD discharges while talking to a friend. He was brought to the emergency room where he feels weak but otherwise is without complaints. His device was apparently interrogated and showed 12 ICD discharges for VT/ventricular fibrillation per report. He was last seen by Dr. Lovena Le in July of 2014 following an ICD shock. At that time it was felt that if he had recurrent episodes he would need an antiarrhythmic such as amiodarone. The patient denied any recent dyspnea on exertion, orthopnea, chest pain, palpitations or syncope. He has had mild increased pedal edema. He was hypokalemic at 3.4. This was repleted and he was also placed on spironolactone. pBNP was 3439. The patient was treated for acute on chronic systolic CHF with IV  Lasix. He was also treated with IV amiodarone for his VT. By 01/01/13, his lasix was changed to oral. With stability of rhythm, amiodarone was changed to oral form on 01/02/13. His initial troponin was 0.30 which later rose to 5.33, although this was felt due to multiple ICD shocks and volume overload after cardiac cath on 01/03/13 showed: 1. Single vessel obstructive CAD involving the distal LCx. This vessel is small and terminates in the AV groove. It is unlikely to cause significant ischemia or electrical instability.  2. Marked LV dysfunction.  3. Mild pulmonary HTN. Medical therapy was recommended. Coumadin was held peri-procedurally with heparin bridge given elevated troponin and then was resumed post-procedure.  He is in sinus rhythm and Dr. Stanford Breed feels risk of embolic event in the short term was small thus was not prescribed bridge at discharge. His rhythm has stabilized. He was instructed not to restart Metformin until 01/06/13. Dr. Stanford Breed has seen and examined the patient today and feels he is stable for discharge.  With regard to followup plans:  - amiodarone 400mg  BID x 2 weeks then 200mg  daily - further monitoring of other organ systems given amiodarone initiation will be at discretion of primary cardiologist - TFTs were normal this admission, hepatic function was added on prior to discharge for baseline - no driving x 6 months - f/u BMET and INR 01/09/13 (need to follow INR closely given amiodarone initiation - Dr. Stanford Breed recommended pre-admission dose of Coumadin. The [place] office was unable to accomodate this on 9/8 so it was scheduled at [facility] office.) -  this admission, Dr Missy Sabins felt that after d/c it would be reasonable to get CPX study to further risk stratify from HF perspective. Dr. Stanford Breed felt that Dr Percival Spanish could see the patient post-discharge and make this decision    Discharge Vitals: Blood pressure 118/64, pulse 62, temperature 98.4 F (36.9 C), temperature source  Oral, resp. rate 18, height 6' (1.829 m), weight 255 lb 1.2 oz (115.7 kg), SpO2 95.00%.  Labs: Lab Results  Component Value Date   WBC 5.7 01/05/2013   HGB 13.1 01/05/2013   HCT 40.2 01/05/2013   MCV 94.1 01/05/2013   PLT 187 01/05/2013     Recent Labs Lab 01/05/13 0433 01/05/13 1111  NA 147*  --   K 4.1  --   CL 109  --   CO2 28  --   BUN 15  --   CREATININE 1.26  --   CALCIUM 9.4  --   PROT  --  6.5  BILITOT  --  1.0  ALKPHOS  --  55  ALT  --  39  AST  --  34  GLUCOSE 101*  --     Lab Results  Component Value Date   CHOL 141 09/16/2012   HDL 36.50* 09/16/2012   LDLCALC 85 09/16/2012   TRIG 99.0 09/16/2012     Diagnostic Studies/Procedures    Cardiac catheterization this admission, please see full report and above for summary.  Dg Chest 2 View 12/29/2012   *RADIOLOGY REPORT*  Clinical Data: Pacemaker discharge x 3 today  CHEST - 2 VIEW  Comparison: 05/24/2012  Findings: Left subclavian AICD with lead tip projecting over right ventricle. Enlargement of cardiac silhouette with pulmonary vascular congestion. Slight chronic accentuation of right perihilar markings. No acute infiltrate, pleural effusion or pneumothorax. Bones unremarkable.  IMPRESSION: Enlargement of cardiac silhouette with pulmonary vascular congestion post AICD. No definite acute infiltrate.   Original Report Authenticated By: Lavonia Dana, M.D.    Discharge Medications     Medication List         allopurinol 300 MG tablet  Commonly known as:  ZYLOPRIM  Take 300 mg by mouth daily.     amiodarone 200 MG tablet  Commonly known as:  PACERONE  Take 2 tablets (400mg ) by mouth twice a day for 2 weeks, then decrease to 1 tablet (200mg ) daily.     amLODipine 10 MG tablet  Commonly known as:  NORVASC  Take 1 tablet (10 mg total) by mouth daily.     atorvastatin 40 MG tablet  Commonly known as:  LIPITOR  Take 1 tablet (40 mg total) by mouth daily.     B-D ULTRAFINE III SHORT PEN 31G X 8 MM Misc  Generic  drug:  Insulin Pen Needle  USE ONCE DAILY AS DIRECTED     carvedilol 25 MG tablet  Commonly known as:  COREG  Take 2 tablets (50 mg total) by mouth 2 (two) times daily with a meal.     furosemide 80 MG tablet  Commonly known as:  LASIX  Take 1 tablet (80 mg total) by mouth 2 (two) times daily.     hydrALAZINE 50 MG tablet  Commonly known as:  APRESOLINE  Take 1 tablet (50 mg total) by mouth every 8 (eight) hours.     isosorbide dinitrate 20 MG tablet  Commonly known as:  ISORDIL  Take 1 tablet (20 mg total) by mouth 3 (three) times daily.     LANTUS SOLOSTAR 100 UNIT/ML injection  Generic drug:  insulin glargine  Inject 10 Units into the skin at bedtime.     metFORMIN 500 MG tablet  Commonly known as:  GLUCOPHAGE  Take 1 tablet (500 mg total) by mouth 2 (two) times daily.  Start taking on:  01/06/2013     multivitamin capsule  Take 1 capsule by mouth daily.     nitroGLYCERIN 0.4 MG SL tablet  Commonly known as:  NITROSTAT  Place 1 tablet (0.4 mg total) under the tongue every 5 (five) minutes x 3 doses as needed for chest pain.     ONE TOUCH TEST STRIPS test strip  Generic drug:  glucose blood  TEST AS DIRECTED     onetouch ultrasoft lancets  TEST AS DIRECTED     potassium chloride SA 20 MEQ tablet  Commonly known as:  K-DUR,KLOR-CON  Take 2 tablets (40 mEq total) by mouth 3 (three) times daily.     ramipril 10 MG tablet  Commonly known as:  ALTACE  Take 1 tablet (10 mg total) by mouth 2 (two) times daily.     spironolactone 25 MG tablet  Commonly known as:  ALDACTONE  Take 1 tablet (25 mg total) by mouth daily.     tiZANidine 4 MG tablet  Commonly known as:  ZANAFLEX  Take 4 mg by mouth every 6 (six) hours as needed.     warfarin 5 MG tablet  Commonly known as:  COUMADIN  - Take 10-15 mg by mouth daily. Mon, Wed, Fri - take 15mg   - Tue, Thur, Sat, Sun - take 10mg         Disposition   The patient will be discharged in stable condition to  home. Discharge Orders   Future Appointments Provider Department Dept Phone   01/09/2013 8:55 AM [facility] [place]) [phone]   01/09/2013 2:30 PM [facility] [phone]   01/13/2013 9:00 AM [facility] [phone]   01/26/2013 1:30 PM Burtis Junes, NP [place] [facility] [place]) [phone]   02/16/2013 9:30 AM [facility] [employer] Main Office [place]) [phone]   03/20/2013 9:15 AM Biagio Borg, MD [facility] [phone]   Future Orders Complete By Expires   Diet - low sodium heart healthy  As directed    Comments:     Diabetic   Discharge instructions  As directed    Comments:     Patients on amiodarone may need intermittent check-ups of other organ systems, such as lungs, thyroid, eyes, and liver function. Please talk to your doctor at your follow-up appointments about what monitoring may be needed for you.   Increase activity slowly  As directed    Comments:     No driving for 6 months. No lifting over 10 lbs for 2 weeks. No sexual activity for 2 weeks. Keep procedure site clean & dry. If you notice increased pain, swelling, bleeding or pus, call/return!  You may shower, but no soaking baths/hot tubs/pools for 1 week.     Follow-up Information   Follow up with [facility]. (01/09/13 at 2:30pm - Coumadin level check and electrolyte check at [facility] office. Your primary care office did not have any spots open that day so this day's labwork will be in the Cardiology office.)    Contact information:   [id number]  [place]  [place] [address]  [phone]  Follow up with Truitt Merle, NP. (01/26/13 at 1:30pm)    Specialty:  Nurse Practitioner   Contact information:   [address] [place] [address] [phone]         Duration of  Discharge Encounter: Greater than 30 minutes including physician and PA time.  Signed, Melina Copa PA-C 01/05/2013, 12:28 PM

## 2013-01-09 ENCOUNTER — Ambulatory Visit (INDEPENDENT_AMBULATORY_CARE_PROVIDER_SITE_OTHER): Payer: Medicare Other | Admitting: Pharmacist

## 2013-01-09 ENCOUNTER — Other Ambulatory Visit (INDEPENDENT_AMBULATORY_CARE_PROVIDER_SITE_OTHER): Payer: Medicare Other

## 2013-01-09 DIAGNOSIS — Z8679 Personal history of other diseases of the circulatory system: Secondary | ICD-10-CM

## 2013-01-09 DIAGNOSIS — I4891 Unspecified atrial fibrillation: Secondary | ICD-10-CM

## 2013-01-09 DIAGNOSIS — Z7901 Long term (current) use of anticoagulants: Secondary | ICD-10-CM

## 2013-01-09 DIAGNOSIS — E876 Hypokalemia: Secondary | ICD-10-CM

## 2013-01-09 LAB — BASIC METABOLIC PANEL
BUN: 26 mg/dL — ABNORMAL HIGH (ref 6–23)
CO2: 30 mEq/L (ref 19–32)
Calcium: 9.7 mg/dL (ref 8.4–10.5)
GFR: 50 mL/min — ABNORMAL LOW (ref >60.00)
Glucose, Bld: 90 mg/dL (ref 70–99)
Potassium: 4.5 mEq/L (ref 3.5–5.1)

## 2013-01-11 ENCOUNTER — Telehealth: Payer: Self-pay | Admitting: Internal Medicine

## 2013-01-11 NOTE — Telephone Encounter (Signed)
Pt has questions, pls call

## 2013-01-11 NOTE — Telephone Encounter (Signed)
Spoke with patient I do not see orders for [facility] after discharge

## 2013-01-17 ENCOUNTER — Encounter: Payer: Self-pay | Admitting: Internal Medicine

## 2013-01-17 ENCOUNTER — Ambulatory Visit (INDEPENDENT_AMBULATORY_CARE_PROVIDER_SITE_OTHER): Payer: Medicare Other | Admitting: General Practice

## 2013-01-17 DIAGNOSIS — Z7901 Long term (current) use of anticoagulants: Secondary | ICD-10-CM

## 2013-01-17 DIAGNOSIS — I4891 Unspecified atrial fibrillation: Secondary | ICD-10-CM

## 2013-01-17 DIAGNOSIS — Z8679 Personal history of other diseases of the circulatory system: Secondary | ICD-10-CM

## 2013-01-20 ENCOUNTER — Telehealth: Payer: Self-pay | Admitting: Physician Assistant

## 2013-01-20 NOTE — Telephone Encounter (Signed)
Received message from Dr. Lovena Le to contact pt to reduce Lasix and ACEI in half. He takes Lasix 80mg  BID and Ramipril 10mg  BID. He would prefer to take once/day in order to decrease in half which is reasonable. After we hung up I realized he is on potassium as well - I called and LMOM to decrease KCl to 54meq once a day. He feels well, mildly dizzy at times but otherwise doing well. No ICD shocks or issues. Has f/u with Cecille Rubin next week - consider repeat BMET at that time. Dayna Dunn PA-C

## 2013-01-26 ENCOUNTER — Ambulatory Visit (INDEPENDENT_AMBULATORY_CARE_PROVIDER_SITE_OTHER): Payer: Medicare Other | Admitting: *Deleted

## 2013-01-26 ENCOUNTER — Ambulatory Visit (INDEPENDENT_AMBULATORY_CARE_PROVIDER_SITE_OTHER): Payer: Medicare Other

## 2013-01-26 ENCOUNTER — Encounter: Payer: Self-pay | Admitting: Nurse Practitioner

## 2013-01-26 ENCOUNTER — Ambulatory Visit (INDEPENDENT_AMBULATORY_CARE_PROVIDER_SITE_OTHER): Payer: Medicare Other | Admitting: Nurse Practitioner

## 2013-01-26 VITALS — BP 120/90 | HR 63 | Ht 72.0 in | Wt 260.8 lb

## 2013-01-26 DIAGNOSIS — I480 Paroxysmal atrial fibrillation: Secondary | ICD-10-CM

## 2013-01-26 DIAGNOSIS — Z7901 Long term (current) use of anticoagulants: Secondary | ICD-10-CM

## 2013-01-26 DIAGNOSIS — I4891 Unspecified atrial fibrillation: Secondary | ICD-10-CM

## 2013-01-26 DIAGNOSIS — I472 Ventricular tachycardia, unspecified: Secondary | ICD-10-CM

## 2013-01-26 DIAGNOSIS — Z8679 Personal history of other diseases of the circulatory system: Secondary | ICD-10-CM

## 2013-01-26 LAB — ICD DEVICE OBSERVATION
BRDY-0002RV: 40 {beats}/min
CHARGE TIME: 0 s
DEV-0020ICD: NEGATIVE
HV IMPEDENCE: 41 Ohm
RV LEAD AMPLITUDE: 12 mv
RV LEAD IMPEDENCE ICD: 440 Ohm
RV LEAD THRESHOLD: 1.25 V
TOT-0006: 20140709000000
TOT-0009: 2
TOT-0010: 56
TZAT-0001FASTVT: 1
TZAT-0004SLOWVT: 8
TZAT-0012FASTVT: 200 ms
TZAT-0012SLOWVT: 200 ms
TZAT-0013FASTVT: 2
TZAT-0013SLOWVT: 4
TZAT-0018SLOWVT: NEGATIVE
TZAT-0019FASTVT: 7.5 V
TZAT-0019SLOWVT: 7.5 V
TZAT-0020FASTVT: 1 ms
TZON-0003SLOWVT: 320 ms
TZON-0004FASTVT: 12
TZON-0004SLOWVT: 12
TZON-0005FASTVT: 6
TZON-0008FASTVT: 80 ms
TZON-0010FASTVT: 80 ms
TZST-0001FASTVT: 2
TZST-0001FASTVT: 4
TZST-0001SLOWVT: 3
TZST-0001SLOWVT: 5
TZST-0003FASTVT: 25 J
TZST-0003FASTVT: 36 J
TZST-0003FASTVT: 36 J
TZST-0003SLOWVT: 25 J
TZST-0003SLOWVT: 36 J

## 2013-01-26 LAB — BASIC METABOLIC PANEL
BUN: 14 mg/dL (ref 6–23)
CO2: 31 mEq/L (ref 19–32)
Calcium: 9 mg/dL (ref 8.4–10.5)
Chloride: 108 mEq/L (ref 96–112)
Creatinine, Ser: 1.3 mg/dL (ref 0.4–1.5)
GFR: 71.96 mL/min (ref >60.00)
Glucose, Bld: 76 mg/dL (ref 70–99)
Potassium: 3.9 mEq/L (ref 3.5–5.1)
Sodium: 142 mEq/L (ref 135–145)

## 2013-01-26 NOTE — Patient Instructions (Addendum)
We need to recheck labs today  Continue with your current medicines   I will see you in a month on a day that Dr. Percival Spanish is here in the office   Weigh yourself each morning and record.  Take extra half of your dose of diuretic for weight gain of 3 pounds in 24 hours. Do this today.   Limit sodium intake. Goal is to have less than 2000 mg (2gm) of salt per day.   Call the [place] office at [phone] if you have any questions, problems or concerns.

## 2013-01-26 NOTE — Progress Notes (Signed)
[name] Date of Birth: [birth year] Medical Record [id number]  History of Present Illness: [name] is seen back today for a post hospital visit. Seen for Dr. Percival Spanish. He has a history of gout, HTN, HLD, OSA with refusal of CPAP, DM and remote CVA. He has a most likely NICM (out of proportion to his CAD noted in 2009) with ICD in place for paroxysmal VT/VF and has paroxysmal atrial fib. He remains on coumadin therapy.   Most recently in the hospital with VT storm and had had multiple shocks. This was in the setting of just mild hypokalemia. Amiodarone was started. Also had associated acute systolic HF and was diuresed. Troponin was positive and this was felt to be due to his multiple shocks and volume overload. He was cathed and had single vessel obstructive CAD involving the distal LCX but felt to unlikely be causing significant ischemia or electrical instability. Dr. Haroldine Laws had mentioned CPX testing but this decision was to be deferred to Dr. Percival Spanish.   Comes in today. Here alone. Doing ok. Has been home about 3 weeks. He is doing ok for the most part. He is a little dizzy - this is mostly with position changes and getting up too quickly. No syncope. No shocks but admits he feels "like I am going to get one". No chest pain. Weight is up about 5 pounds. Says he was never told to take extra lasix prn. No swelling. No tremor. Tolerating the amiodarone. Has cut down to just 200 mg a day as of September 18th.   Current Outpatient Prescriptions  Medication Sig Dispense Refill  . allopurinol (ZYLOPRIM) 300 MG tablet Take 300 mg by mouth daily.      [name] amiodarone (PACERONE) 200 MG tablet 200 mg daily.      [name] amLODipine (NORVASC) 10 MG tablet Take 1 tablet (10 mg total) by mouth daily.  90 tablet  3  . atorvastatin (LIPITOR) 40 MG tablet Take 1 tablet (40 mg total) by mouth daily.  90 tablet  3  . B-D ULTRAFINE III SHORT PEN 31G X 8 MM MISC USE ONCE DAILY AS DIRECTED  100 each  3  .  carvedilol (COREG) 25 MG tablet Take 2 tablets (50 mg total) by mouth 2 (two) times daily with a meal.  360 tablet  3  . furosemide (LASIX) 80 MG tablet Take 80 mg by mouth daily.      . hydrALAZINE (APRESOLINE) 50 MG tablet Take 1 tablet (50 mg total) by mouth every 8 (eight) hours.  270 tablet  3  . insulin glargine (LANTUS SOLOSTAR) 100 UNIT/ML injection Inject 10 Units into the skin at bedtime.      . isosorbide dinitrate (ISORDIL) 20 MG tablet Take 1 tablet (20 mg total) by mouth 3 (three) times daily.  270 tablet  3  . Lancets (ONETOUCH ULTRASOFT) lancets TEST AS DIRECTED  100 each  11  . metFORMIN (GLUCOPHAGE) 500 MG tablet Take 1 tablet (500 mg total) by mouth 2 (two) times daily.      . Multiple Vitamin (MULTIVITAMIN) capsule Take 1 capsule by mouth daily.       . nitroGLYCERIN (NITROSTAT) 0.4 MG SL tablet Place 1 tablet (0.4 mg total) under the tongue every 5 (five) minutes x 3 doses as needed for chest pain.  25 tablet  11  . ONE TOUCH TEST STRIPS test strip TEST AS DIRECTED  100 each  11  . potassium chloride SA (K-DUR,KLOR-CON) 20 MEQ tablet  Take 40 mEq by mouth daily.      . ramipril (ALTACE) 10 MG tablet Take 10 mg by mouth daily.      [name] spironolactone (ALDACTONE) 25 MG tablet Take 1 tablet (25 mg total) by mouth daily.  30 tablet  3  . tiZANidine (ZANAFLEX) 4 MG tablet Take 4 mg by mouth every 6 (six) hours as needed.      . warfarin (COUMADIN) 5 MG tablet Take 10-15 mg by mouth daily. Mon, Wed, Fri - take 15mg  Tue, Thur, Sat, Sun - take 10mg        No current facility-administered medications for this visit.    No Known Allergies  Past Medical History  Diagnosis Date  . Chronic systolic CHF (congestive heart failure)     a. Likely NICM (out of proportion to CAD). b. 2009 - EF 25-30% by echo, 01/2013: 15-20% by cath. c. s/p St. Jude ICD implantation 2007.  [name] Gout   . Hypokalemia   . HTN (hypertension)   . Lipoma   . Dyslipidemia   . CAD (coronary artery disease)     a.  Initial nonobst by cath 2009. b. Cath 01/2013 in setting of VT storm: obstructive distal Cx disease (small and terminates in the AV groove, unlikely to cause significant ischemia or electrical instability), nonobstructive RCA disease, EF 15-20%.   . Atrial fibrillation     a. Noted 05/2008 by EKG.  . Paroxysmal VT     a. s/p St. Jude ICD 2007. b. H/o paroxysmal VT/VF including VT storm 12/2012 admission prompting amio initiation.  . Nonischemic cardiomyopathy   . ICD (implantable cardiac defibrillator) in place   . OSA (obstructive sleep apnea)     does not wear cpap  . DM (diabetes mellitus)     insulin dependent  . Cerebrovascular accident     a. 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 Pulmonary HTN     a. Mild by cath 01/2013.    Past Surgical History  Procedure Laterality Date  . Cardiac catheterization      Nonobstructive coronary disease 2009  . Cardiac defibrillator placement      ICD-St. Jude  . Lipoma excision      History  Smoking status  . Former Smoker  . Quit date: 05/04/1986  Smokeless tobacco  . Never Used    Comment: Quit smoking 30 yrs ago. Smoked as teenager less than 1/2 ppd. Smoked x 4 years.    History  Alcohol Use  . Yes    Comment: occasionally    Family History  Problem Relation Age of Onset  . Coronary artery disease Mother     Review of Systems: The review of systems is per the HPI.  All other systems were reviewed and are negative.  Physical Exam: BP 120/90  Pulse 63  Ht 6' (1.829 m)  Wt 260 lb 12.8 oz (118.298 kg)  BMI 35.36 kg/m2 Patient is very pleasant and in no acute distress. Skin is warm and dry. Color is normal.  HEENT is unremarkable. Normocephalic/atraumatic. PERRL. Sclera are nonicteric. Neck is supple. No masses. No JVD. Lungs are clear. Cardiac exam shows a regular rate and rhythm. Abdomen is soft. Extremities are without edema. Gait and ROM are intact. No gross neurologic deficits noted.  LABORATORY DATA: EKG today shows sinus  rhythm.   Lab Results  Component Value Date   WBC 5.7 01/05/2013   HGB 13.1 01/05/2013   HCT 40.2 01/05/2013   PLT 187 01/05/2013  GLUCOSE 90 01/09/2013   CHOL 141 09/16/2012   TRIG 99.0 09/16/2012   HDL 36.50* 09/16/2012   LDLDIRECT 194.6 05/09/2007   LDLCALC 85 09/16/2012   ALT 39 01/05/2013   AST 34 01/05/2013   NA 144 01/09/2013   K 4.5 01/09/2013   CL 109 01/09/2013   CREATININE 1.8* 01/09/2013   BUN 26* 01/09/2013   CO2 30 01/09/2013   TSH 1.313 12/30/2012   PSA 0.50 03/18/2012   INR 3.1 01/26/2013   HGBA1C 5.5 09/16/2012   MICROALBUR 1.0 09/08/2011   Wt Readings from Last 3 Encounters:  01/26/13 260 lb 12.8 oz (118.298 kg)  01/05/13 255 lb 1.2 oz (115.7 kg)  01/05/13 255 lb 1.2 oz (115.7 kg)     Assessment / Plan: 1. VT storm - recent initiation of amiodarone - has ICD in place - no driving for 6 months. EKG checked today. ICD is checked today - he has had no recurrent arrhythmia noted. He is reassured. Will continue amiodarone.   2. Systolic HF - 25 to [id number] per cath back in 2009. 15 to 20% per recent cath 01/2013 - weight is up some - I have asked him to use an extra half of his Lasix to get his weight back down. I do not really see a need for CPX testing at this time.   3. CAD - obstructive distal LCX disease but not felt to be significant, nonobstructive disease in the RCA - to manage medically. No more chest pain reported.   4. Underlying ICD - full interrogation done today.  5. PAF - in sinus today.   6. Chronic coumadin - therapeutic today.   7. Hypokalemia - recheck labs today.   8. Dizziness - probably from medicines/orthostasis - his device shows no VT today.   I will see him back in a month on a day with Dr. Percival Spanish - will also get a recall visit set back up for Dr. Percival Spanish.   Patient is agreeable to this plan and will call if any problems develop in the interim.   Burtis Junes, RN, [place] [address] [place], [place]   [id number]

## 2013-01-26 NOTE — Progress Notes (Signed)
ICD check in clinic. Normal device function. Thresholds and sensing consistent with previous device measurements. Impedance trends stable over time. No evidence of any ventricular arrhythmias since 12-29-12 (VT storm). Histogram distribution appropriate for patient and level of activity. No changes made this session. Device programmed at appropriate safety margins. Device programmed to optimize intrinsic conduction. ROV in 3 mths w/GT.

## 2013-01-27 ENCOUNTER — Telehealth: Payer: Self-pay | Admitting: Cardiology

## 2013-01-27 NOTE — Telephone Encounter (Signed)
New problem     Patient returning call back to [facility] from yesterday . Patient saw Cecille Rubin on 9/25.

## 2013-01-27 NOTE — Telephone Encounter (Signed)
Spoke with pt who is aware of lab results.

## 2013-02-02 ENCOUNTER — Encounter: Payer: Self-pay | Admitting: Internal Medicine

## 2013-02-10 ENCOUNTER — Ambulatory Visit (INDEPENDENT_AMBULATORY_CARE_PROVIDER_SITE_OTHER): Payer: Medicare Other | Admitting: General Practice

## 2013-02-10 DIAGNOSIS — I4891 Unspecified atrial fibrillation: Secondary | ICD-10-CM

## 2013-02-10 DIAGNOSIS — Z8679 Personal history of other diseases of the circulatory system: Secondary | ICD-10-CM

## 2013-02-10 DIAGNOSIS — Z7901 Long term (current) use of anticoagulants: Secondary | ICD-10-CM

## 2013-02-10 LAB — POCT INR: INR: 2.7

## 2013-02-21 ENCOUNTER — Encounter: Payer: Self-pay | Admitting: Cardiology

## 2013-02-21 ENCOUNTER — Ambulatory Visit (INDEPENDENT_AMBULATORY_CARE_PROVIDER_SITE_OTHER): Payer: Medicare Other | Admitting: Cardiology

## 2013-02-21 VITALS — BP 114/88 | HR 76 | Ht 72.0 in | Wt 263.0 lb

## 2013-02-21 DIAGNOSIS — I472 Ventricular tachycardia: Secondary | ICD-10-CM

## 2013-02-21 DIAGNOSIS — I2510 Atherosclerotic heart disease of native coronary artery without angina pectoris: Secondary | ICD-10-CM

## 2013-02-21 NOTE — Progress Notes (Signed)
HPI This patient presents for followup after a recent ER visit for chest pain. He was at work when this happened. He was his usual chest pain but he did not have his nitroglycerin. He's been getting this discomfort infrequently particularly after he walks a long distance. However, a minutes after taking his nitroglycerin the pain disappears. He's taken off for over the last month which is a stable pattern. His pain is not reproducible with activities. He denies any resting chest pressure, neck or arm discomfort. He's had no new shortness of breath, PND or orthopnea. He has had no new palpitations, presyncope or syncope.  No Known Allergies  Current Outpatient Prescriptions  Medication Sig Dispense Refill  . allopurinol (ZYLOPRIM) 300 MG tablet Take 300 mg by mouth daily.      [name] amiodarone (PACERONE) 200 MG tablet 200 mg daily.      [name] amLODipine (NORVASC) 10 MG tablet Take 1 tablet (10 mg total) by mouth daily.  90 tablet  3  . atorvastatin (LIPITOR) 40 MG tablet Take 1 tablet (40 mg total) by mouth daily.  90 tablet  3  . B-D ULTRAFINE III SHORT PEN 31G X 8 MM MISC USE ONCE DAILY AS DIRECTED  100 each  3  . carvedilol (COREG) 25 MG tablet Take 2 tablets (50 mg total) by mouth 2 (two) times daily with a meal.  360 tablet  3  . furosemide (LASIX) 80 MG tablet Take 80 mg by mouth daily.      . hydrALAZINE (APRESOLINE) 50 MG tablet Take 1 tablet (50 mg total) by mouth every 8 (eight) hours.  270 tablet  3  . insulin glargine (LANTUS SOLOSTAR) 100 UNIT/ML injection Inject 10 Units into the skin at bedtime.      . isosorbide dinitrate (ISORDIL) 20 MG tablet Take 1 tablet (20 mg total) by mouth 3 (three) times daily.  270 tablet  3  . Lancets (ONETOUCH ULTRASOFT) lancets TEST AS DIRECTED  100 each  11  . metFORMIN (GLUCOPHAGE) 500 MG tablet Take 1 tablet (500 mg total) by mouth 2 (two) times daily.      . Multiple Vitamin (MULTIVITAMIN) capsule Take 1 capsule by mouth daily.       . nitroGLYCERIN  (NITROSTAT) 0.4 MG SL tablet Place 1 tablet (0.4 mg total) under the tongue every 5 (five) minutes x 3 doses as needed for chest pain.  25 tablet  11  . ONE TOUCH TEST STRIPS test strip TEST AS DIRECTED  100 each  11  . potassium chloride SA (K-DUR,KLOR-CON) 20 MEQ tablet Take 40 mEq by mouth daily.      . ramipril (ALTACE) 10 MG tablet Take 10 mg by mouth daily.      [name] spironolactone (ALDACTONE) 25 MG tablet Take 1 tablet (25 mg total) by mouth daily.  30 tablet  3  . tiZANidine (ZANAFLEX) 4 MG tablet Take 4 mg by mouth every 6 (six) hours as needed.      . warfarin (COUMADIN) 5 MG tablet Take 10-15 mg by mouth daily. Mon, Tue, Wed, Fri, Sat, Sun - take 10mg  Demetrius Charity - take 7.5mg        No current facility-administered medications for this visit.    Past Medical History  Diagnosis Date  . Chronic systolic CHF (congestive heart failure)     a. Likely NICM (out of proportion to CAD). b. 2009 - EF 25-30% by echo, 01/2013: 15-20% by cath. c. s/p St. Jude ICD implantation 2007.  [name]  Gout   . Hypokalemia   . HTN (hypertension)   . Lipoma   . Dyslipidemia   . CAD (coronary artery disease)     a. Initial nonobst by cath 2009. b. Cath 01/2013 in setting of VT storm: obstructive distal Cx disease (small and terminates in the AV groove, unlikely to cause significant ischemia or electrical instability), nonobstructive RCA disease, EF 15-20%.   . Atrial fibrillation     a. Noted 05/2008 by EKG.  . Paroxysmal VT     a. s/p St. Jude ICD 2007. b. H/o paroxysmal VT/VF including VT storm 12/2012 admission prompting amio initiation.  . Nonischemic cardiomyopathy   . ICD (implantable cardiac defibrillator) in place   . OSA (obstructive sleep apnea)     does not wear cpap  . DM (diabetes mellitus)     insulin dependent  . Cerebrovascular accident     a. 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 Pulmonary HTN     a. Mild by cath 01/2013.    Past Surgical History  Procedure Laterality Date  . Cardiac  catheterization      Nonobstructive coronary disease 2009  . Cardiac defibrillator placement      ICD-St. Jude  . Lipoma excision     ROS:  As stated in the HPI and negative for all other systems.  PHYSICAL EXAM BP 114/88  Pulse 76  Ht 6' (1.829 m)  Wt 263 lb (119.296 kg)  BMI 35.66 kg/m2  SpO2 96% GENERAL:  Well appearing HEENT:  Pupils equal round and reactive, fundi not visualized, oral mucosa unremarkable NECK:  No jugular venous distention, waveform within normal limits, carotid upstroke brisk and symmetric, no bruits, no thyromegaly LYMPHATICS:  No cervical, inguinal adenopathy LUNGS:  Clear to auscultation bilaterally BACK:  No CVA tenderness CHEST:  Unremarkable, well-healed ICD pocket HEART:  PMI not displaced or sustained,S1 and S2 within normal limits, no S3, no S4, no clicks, no rubs, no murmurs ABD:  Flat, positive bowel sounds normal in frequency in pitch, no bruits, no rebound, no guarding, no midline pulsatile mass, no hepatomegaly, no splenomegaly EXT:  2 plus pulses throughout, no edema, no cyanosis no clubbing SKIN:  No rashes no nodules NEURO:  Cranial nerves II through XII grossly intact, motor grossly intact throughout PSYCH:  Cognitively intact, oriented to person place and time   ASSESSMENT AND PLAN   CAD -  The patient has a stable chest pain pattern. At this point I would not suggest any change to his medical regimen. He'll let me know if he has any increasing symptoms at which point I might suggest further testing. He has had nonobstructive disease previously.  Congestive heart failure, unspecified -  He has had no new symptoms since his last echo done in the fall. He seems to be euvolemic. He'll continue the meds as listed.  HYPERTENSION -  His blood pressure is better controlled.  He will continue the medications as ordered.  Atrial fibrillation-  He has had no symptomatic paroxysms of this. He's not interested in changing from warfarin to  another agent.  Diabetes - His hemoglobin A1c last checked was 5.4. No change in therapy is indicated.

## 2013-02-21 NOTE — Patient Instructions (Addendum)
The current medical regimen is effective;  continue present plan and medications.  Follow up in 2 months with Dr Percival Spanish.

## 2013-02-21 NOTE — Progress Notes (Signed)
HPI This patient presents for followup after a hospitalization for firing of his ICD. He had cardiac catheterization nonobstructive disease. He was started on amiodarone. Since that time he has had no further firings. He has gained a little weight since his hospitalization. However, he's not having any presyncope or syncope. He's not having any chest pressure, neck or arm discomfort. He's had no palpitations or presyncope.    No Known Allergies  Current Outpatient Prescriptions  Medication Sig Dispense Refill  . allopurinol (ZYLOPRIM) 300 MG tablet Take 300 mg by mouth daily.      [name] amiodarone (PACERONE) 200 MG tablet 200 mg daily.      [name] amLODipine (NORVASC) 10 MG tablet Take 1 tablet (10 mg total) by mouth daily.  90 tablet  3  . atorvastatin (LIPITOR) 40 MG tablet Take 1 tablet (40 mg total) by mouth daily.  90 tablet  3  . B-D ULTRAFINE III SHORT PEN 31G X 8 MM MISC USE ONCE DAILY AS DIRECTED  100 each  3  . carvedilol (COREG) 25 MG tablet Take 2 tablets (50 mg total) by mouth 2 (two) times daily with a meal.  360 tablet  3  . furosemide (LASIX) 80 MG tablet Take 80 mg by mouth daily.      . hydrALAZINE (APRESOLINE) 50 MG tablet Take 1 tablet (50 mg total) by mouth every 8 (eight) hours.  270 tablet  3  . insulin glargine (LANTUS SOLOSTAR) 100 UNIT/ML injection Inject 10 Units into the skin at bedtime.      . isosorbide dinitrate (ISORDIL) 20 MG tablet Take 1 tablet (20 mg total) by mouth 3 (three) times daily.  270 tablet  3  . Lancets (ONETOUCH ULTRASOFT) lancets TEST AS DIRECTED  100 each  11  . metFORMIN (GLUCOPHAGE) 500 MG tablet Take 1 tablet (500 mg total) by mouth 2 (two) times daily.      . Multiple Vitamin (MULTIVITAMIN) capsule Take 1 capsule by mouth daily.       . nitroGLYCERIN (NITROSTAT) 0.4 MG SL tablet Place 1 tablet (0.4 mg total) under the tongue every 5 (five) minutes x 3 doses as needed for chest pain.  25 tablet  11  . ONE TOUCH TEST STRIPS test strip TEST AS  DIRECTED  100 each  11  . potassium chloride SA (K-DUR,KLOR-CON) 20 MEQ tablet Take 40 mEq by mouth daily.      . ramipril (ALTACE) 10 MG tablet Take 10 mg by mouth daily.      [name] spironolactone (ALDACTONE) 25 MG tablet Take 1 tablet (25 mg total) by mouth daily.  30 tablet  3  . tiZANidine (ZANAFLEX) 4 MG tablet Take 4 mg by mouth every 6 (six) hours as needed.      . warfarin (COUMADIN) 5 MG tablet Take 10-15 mg by mouth daily. Mon, Tue, Wed, Fri, Sat, Sun - take 10mg  Demetrius Charity - take 7.5mg        No current facility-administered medications for this visit.    Past Medical History  Diagnosis Date  . Chronic systolic CHF (congestive heart failure)     a. Likely NICM (out of proportion to CAD). b. 2009 - EF 25-30% by echo, 01/2013: 15-20% by cath. c. s/p St. Jude ICD implantation 2007.  [name] Gout   . Hypokalemia   . HTN (hypertension)   . Lipoma   . Dyslipidemia   . CAD (coronary artery disease)     a. Initial nonobst by cath 2009. b.  Cath 01/2013 in setting of VT storm: obstructive distal Cx disease (small and terminates in the AV groove, unlikely to cause significant ischemia or electrical instability), nonobstructive RCA disease, EF 15-20%.   . Atrial fibrillation     a. Noted 05/2008 by EKG.  . Paroxysmal VT     a. s/p St. Jude ICD 2007. b. H/o paroxysmal VT/VF including VT storm 12/2012 admission prompting amio initiation.  . Nonischemic cardiomyopathy   . ICD (implantable cardiac defibrillator) in place   . OSA (obstructive sleep apnea)     does not wear cpap  . DM (diabetes mellitus)     insulin dependent  . Cerebrovascular accident     a. 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 Pulmonary HTN     a. Mild by cath 01/2013.    Past Surgical History  Procedure Laterality Date  . Cardiac catheterization      Nonobstructive coronary disease 2009  . Cardiac defibrillator placement      ICD-St. Jude  . Lipoma excision     ROS:  As stated in the HPI and negative for all other  systems.  PHYSICAL EXAM BP 114/88  Pulse 76  Ht 6' (1.829 m)  Wt 263 lb (119.296 kg)  BMI 35.66 kg/m2  SpO2 96% GENERAL:  Well appearing NECK:  No jugular venous distention, waveform within normal limits, carotid upstroke brisk and symmetric, no bruits, no thyromegaly LYMPHATICS:  No cervical, inguinal adenopathy LUNGS:  Clear to auscultation bilaterally BACK:  No CVA tenderness CHEST:  Unremarkable, well-healed ICD pocket HEART:  PMI not displaced or sustained,S1 and S2 within normal limits, no S3, no S4, no clicks, no rubs, no murmurs ABD:  Flat, positive bowel sounds normal in frequency in pitch, no bruits, no rebound, no guarding, no midline pulsatile mass, no hepatomegaly, no splenomegaly EXT:  2 plus pulses throughout, no edema, no cyanosis no clubbing   ASSESSMENT AND PLAN   CAD -  The patient has no new sypmtoms.  No further cardiovascular testing is indicated.  We will continue with aggressive risk reduction and meds as listed.  Congestive heart failure, unspecified -  He has had no new symptoms.   He seems to be euvolemic. He'll continue the meds as listed.   We might consider CPX testing test to look for the possibility of advanced therpaies as was mentioned in the hospital.  VENTRICULAR TACHYCARDIA -  he will continue with the amiodarone. I'll see him back in a couple of months and I will likely repeat thyroid studies and a liver enzyme at that time.  HYPERTENSION -  His blood pressure is better controlled.  He will continue the medications as ordered.  Atrial fibrillation-  He has had no symptomatic paroxysms of this. He's not interested in changing from warfarin to another agent.  Diabetes - His hemoglobin A1c last checked was 5.4. No change in therapy is indicated.

## 2013-02-28 ENCOUNTER — Telehealth: Payer: Self-pay | Admitting: Cardiology

## 2013-02-28 NOTE — Telephone Encounter (Signed)
New problem    Pt would like to know if he could cut grass.   Please give him a call back.

## 2013-02-28 NOTE — Telephone Encounter (Signed)
Left message for pt - OK to mow however if he has any chest pain or SOB he soon stop.  Requested he call back to discuss any other concerns.

## 2013-03-09 ENCOUNTER — Other Ambulatory Visit: Payer: Self-pay

## 2013-03-10 ENCOUNTER — Ambulatory Visit (INDEPENDENT_AMBULATORY_CARE_PROVIDER_SITE_OTHER): Payer: Medicare Other | Admitting: General Practice

## 2013-03-10 DIAGNOSIS — Z7901 Long term (current) use of anticoagulants: Secondary | ICD-10-CM

## 2013-03-10 DIAGNOSIS — Z23 Encounter for immunization: Secondary | ICD-10-CM

## 2013-03-10 DIAGNOSIS — Z8679 Personal history of other diseases of the circulatory system: Secondary | ICD-10-CM

## 2013-03-10 DIAGNOSIS — I4891 Unspecified atrial fibrillation: Secondary | ICD-10-CM

## 2013-03-10 NOTE — Progress Notes (Signed)
Pre-visit discussion using our clinic review tool. No additional management support is needed unless otherwise documented below in the visit note.  

## 2013-03-14 ENCOUNTER — Ambulatory Visit: Payer: Medicare Other | Admitting: Cardiology

## 2013-03-20 ENCOUNTER — Ambulatory Visit: Payer: Medicare Other | Admitting: Internal Medicine

## 2013-03-21 ENCOUNTER — Encounter: Payer: Self-pay | Admitting: Internal Medicine

## 2013-03-21 ENCOUNTER — Ambulatory Visit (INDEPENDENT_AMBULATORY_CARE_PROVIDER_SITE_OTHER): Payer: Medicare Other | Admitting: Internal Medicine

## 2013-03-21 ENCOUNTER — Other Ambulatory Visit (INDEPENDENT_AMBULATORY_CARE_PROVIDER_SITE_OTHER): Payer: Medicare Other

## 2013-03-21 VITALS — BP 120/68 | HR 79 | Temp 98.9°F | Ht 72.0 in | Wt 259.4 lb

## 2013-03-21 DIAGNOSIS — Z125 Encounter for screening for malignant neoplasm of prostate: Secondary | ICD-10-CM

## 2013-03-21 DIAGNOSIS — Z0000 Encounter for general adult medical examination without abnormal findings: Secondary | ICD-10-CM

## 2013-03-21 DIAGNOSIS — E119 Type 2 diabetes mellitus without complications: Secondary | ICD-10-CM

## 2013-03-21 LAB — BASIC METABOLIC PANEL
BUN: 17 mg/dL (ref 6–23)
Calcium: 9.4 mg/dL (ref 8.4–10.5)
Chloride: 106 mEq/L (ref 96–112)
Creatinine, Ser: 1.2 mg/dL (ref 0.4–1.5)
Glucose, Bld: 99 mg/dL (ref 70–99)
Potassium: 3.7 mEq/L (ref 3.5–5.1)

## 2013-03-21 LAB — CBC WITH DIFFERENTIAL/PLATELET
Basophils Absolute: 0 10*3/uL (ref 0.0–0.1)
Basophils Relative: 0.4 % (ref 0.0–3.0)
Eosinophils Absolute: 0.1 10*3/uL (ref 0.0–0.7)
Hemoglobin: 13.2 g/dL (ref 13.0–17.0)
Lymphocytes Relative: 16.1 % (ref 12.0–46.0)
Monocytes Relative: 8.3 % (ref 3.0–12.0)
Neutro Abs: 4.1 10*3/uL (ref 1.4–7.7)
RBC: 4.18 Mil/uL — ABNORMAL LOW (ref 4.22–5.81)
RDW: 14.2 % (ref 11.5–14.6)

## 2013-03-21 LAB — HEPATIC FUNCTION PANEL
ALT: 27 U/L (ref 0–53)
Albumin: 3.9 g/dL (ref 3.5–5.2)
Alkaline Phosphatase: 55 U/L (ref 39–117)
Bilirubin, Direct: 0.2 mg/dL (ref 0.0–0.3)
Total Protein: 7.1 g/dL (ref 6.0–8.3)

## 2013-03-21 LAB — LIPID PANEL
Cholesterol: 155 mg/dL (ref 0–200)
HDL: 32.6 mg/dL — ABNORMAL LOW (ref >39.00)
LDL Cholesterol: 106 mg/dL — ABNORMAL HIGH (ref 0–99)
Triglycerides: 81 mg/dL (ref 0.0–149.0)

## 2013-03-21 LAB — MICROALBUMIN / CREATININE URINE RATIO
Creatinine,U: 200.4 mg/dL
Microalb Creat Ratio: 0.8 mg/g (ref 0.0–30.0)
Microalb, Ur: 1.7 mg/dL (ref 0.0–1.9)

## 2013-03-21 LAB — URINALYSIS, ROUTINE W REFLEX MICROSCOPIC
Nitrite: NEGATIVE
Specific Gravity, Urine: 1.015 (ref 1.000–1.030)
Total Protein, Urine: NEGATIVE
Urine Glucose: NEGATIVE
Urobilinogen, UA: 2 (ref 0.0–1.0)

## 2013-03-21 LAB — TSH: TSH: 0.88 u[IU]/mL (ref 0.35–5.50)

## 2013-03-21 LAB — PSA: PSA: 0.46 ng/mL (ref 0.10–4.00)

## 2013-03-21 MED ORDER — CARVEDILOL 25 MG PO TABS: 50.0000 mg | ORAL_TABLET | Freq: Two times a day (BID) | ORAL | Status: DC

## 2013-03-21 MED ORDER — INSULIN GLARGINE 100 UNIT/ML ~~LOC~~ SOLN: 10.0000 [IU] | Freq: Every day | SUBCUTANEOUS | Status: DC

## 2013-03-21 NOTE — Assessment & Plan Note (Signed)

## 2013-03-21 NOTE — Progress Notes (Signed)
Subjective:    Patient ID: [name], male    DOB: 04/26/1956, 57 y.o.   MRN: [id number]  HPI  Here for wellness and f/u;  Overall doing ok;  Pt denies CP, worsening SOB, DOE, wheezing, orthopnea, PND, worsening LE edema, palpitations, dizziness or syncope.  Pt denies neurological change such as new headache, facial or extremity weakness.  Pt denies polydipsia, polyuria, or low sugar symptoms. Pt states overall good compliance with treatment and medications, good tolerability, and has been trying to follow lower cholesterol diet.  Pt denies worsening depressive symptoms, suicidal ideation or panic. No fever, night sweats, wt loss, loss of appetite, or other constitutional symptoms.  Pt states good ability with ADL's, has low fall risk, home safety reviewed and adequate, no other significant changes in hearing or vision, and only occasionally active with exercise. S/p shock x 2 in sept, none since. Past Medical History  Diagnosis Date  . Chronic systolic CHF (congestive heart failure)     a. Likely NICM (out of proportion to CAD). b. 2009 - EF 25-30% by echo, 01/2013: 15-20% by cath. c. s/p St. Jude ICD implantation 2007.  [name] Gout   . Hypokalemia   . HTN (hypertension)   . Lipoma   . Dyslipidemia   . CAD (coronary artery disease)     a. Initial nonobst by cath 2009. b. Cath 01/2013 in setting of VT storm: obstructive distal Cx disease (small and terminates in the AV groove, unlikely to cause significant ischemia or electrical instability), nonobstructive RCA disease, EF 15-20%.   . Atrial fibrillation     a. Noted 05/2008 by EKG.  . Paroxysmal VT     a. s/p St. Jude ICD 2007. b. H/o paroxysmal VT/VF including VT storm 12/2012 admission prompting amio initiation.  . Nonischemic cardiomyopathy   . ICD (implantable cardiac defibrillator) in place   . OSA (obstructive sleep apnea)     does not wear cpap  . DM (diabetes mellitus)     insulin dependent  . Cerebrovascular accident     a. Basilar  CVA 2000. denies deficits  . Pulmonary HTN     a. Mild by cath 01/2013.   Past Surgical History  Procedure Laterality Date  . Cardiac catheterization      Nonobstructive coronary disease 2009  . Cardiac defibrillator placement      ICD-St. Jude  . Lipoma excision      reports that he quit smoking about 26 years ago. He has never used smokeless tobacco. He reports that he drinks alcohol. He reports that he does not use illicit drugs. family history includes Coronary artery disease in his mother. No Known Allergies Current Outpatient Prescriptions on File Prior to Visit  Medication Sig Dispense Refill  . allopurinol (ZYLOPRIM) 300 MG tablet Take 300 mg by mouth daily.      [name] amiodarone (PACERONE) 200 MG tablet 200 mg daily.      [name] amLODipine (NORVASC) 10 MG tablet Take 1 tablet (10 mg total) by mouth daily.  90 tablet  3  . atorvastatin (LIPITOR) 40 MG tablet Take 1 tablet (40 mg total) by mouth daily.  90 tablet  3  . B-D ULTRAFINE III SHORT PEN 31G X 8 MM MISC USE ONCE DAILY AS DIRECTED  100 each  3  . furosemide (LASIX) 80 MG tablet Take 80 mg by mouth daily.      . hydrALAZINE (APRESOLINE) 50 MG tablet Take 1 tablet (50 mg total) by mouth every 8 (  eight) hours.  270 tablet  3  . isosorbide dinitrate (ISORDIL) 20 MG tablet Take 1 tablet (20 mg total) by mouth 3 (three) times daily.  270 tablet  3  . Lancets (ONETOUCH ULTRASOFT) lancets TEST AS DIRECTED  100 each  11  . metFORMIN (GLUCOPHAGE) 500 MG tablet Take 1 tablet (500 mg total) by mouth 2 (two) times daily.      . Multiple Vitamin (MULTIVITAMIN) capsule Take 1 capsule by mouth daily.       . nitroGLYCERIN (NITROSTAT) 0.4 MG SL tablet Place 1 tablet (0.4 mg total) under the tongue every 5 (five) minutes x 3 doses as needed for chest pain.  25 tablet  11  . ONE TOUCH TEST STRIPS test strip TEST AS DIRECTED  100 each  11  . potassium chloride SA (K-DUR,KLOR-CON) 20 MEQ tablet Take 40 mEq by mouth daily.      . ramipril (ALTACE) 10  MG tablet Take 10 mg by mouth daily.      [name] spironolactone (ALDACTONE) 25 MG tablet Take 1 tablet (25 mg total) by mouth daily.  30 tablet  3  . tiZANidine (ZANAFLEX) 4 MG tablet Take 4 mg by mouth every 6 (six) hours as needed.      . warfarin (COUMADIN) 5 MG tablet Take 10-15 mg by mouth daily. Mon, Tue, Wed, Fri, Sat, Sun - take 10mg  Demetrius Charity - take 7.5mg        No current facility-administered medications on file prior to visit.   Review of Systems Constitutional: Negative for diaphoresis, activity change, appetite change or unexpected weight change.  HENT: Negative for hearing loss, ear pain, facial swelling, mouth sores and neck stiffness.   Eyes: Negative for pain, redness and visual disturbance.  Respiratory: Negative for shortness of breath and wheezing.   Cardiovascular: Negative for chest pain and palpitations.  Gastrointestinal: Negative for diarrhea, blood in stool, abdominal distention or other pain Genitourinary: Negative for hematuria, flank pain or change in urine volume.  Musculoskeletal: Negative for myalgias and joint swelling.  Skin: Negative for color change and wound.  Neurological: Negative for syncope and numbness. other than noted Hematological: Negative for adenopathy.  Psychiatric/Behavioral: Negative for hallucinations, self-injury, decreased concentration and agitation.      Objective:   Physical Exam BP 120/68  Pulse 79  Temp(Src) 98.9 F (37.2 C) (Oral)  Ht 6' (1.829 m)  Wt 259 lb 6 oz (117.652 kg)  BMI 35.17 kg/m2  SpO2 93% VS noted,  Constitutional: Pt is oriented to person, place, and time. Appears well-developed and well-nourished.  Head: Normocephalic and atraumatic.  Right Ear: External ear normal.  Left Ear: External ear normal.  Nose: Nose normal.  Mouth/Throat: Oropharynx is clear and moist.  Eyes: Conjunctivae and EOM are normal. Pupils are equal, round, and reactive to light.  Neck: Normal range of motion. Neck supple. No JVD present. No  tracheal deviation present.  Cardiovascular: Normal rate, irregular rhythm, normal heart sounds and intact distal pulses.   Pulmonary/Chest: Effort normal and breath sounds normal.  Abdominal: Soft. Bowel sounds are normal. There is no tenderness. No HSM  Musculoskeletal: Normal range of motion. Exhibits no edema.  Lymphadenopathy:  Has no cervical adenopathy.  Neurological: Pt is alert and oriented to person, place, and time. Pt has normal reflexes. No cranial nerve deficit.  Skin: Skin is warm and dry. No rash noted.  Psychiatric:  Has  normal mood and affect. Behavior is normal.      Assessment & Plan:

## 2013-03-21 NOTE — Patient Instructions (Addendum)
Please continue all other medications as before, and refills have been done if requested. Please have the pharmacy call with any other refills you may need. Please continue your efforts at being more active, low cholesterol diet, and weight control. You are otherwise up to date with prevention measures today. Please keep your appointments with your specialists as you have planned Please go to the LAB in the Basement (turn left off the elevator) for the tests to be done today You will be contacted by phone if any changes need to be made immediately.  Otherwise, you will receive a letter about your results with an explanation, but please check with MyChart first.  Please remember to sign up for My Chart if you have not done so, as this will be important to you in the future with finding out test results, communicating by private email, and scheduling acute appointments online when needed.  Please return in 6 months, or sooner if needed, with Lab testing done 3-5 days before

## 2013-03-21 NOTE — Progress Notes (Signed)
Pre-visit discussion using our clinic review tool. No additional management support is needed unless otherwise documented below in the visit note.  

## 2013-03-27 ENCOUNTER — Other Ambulatory Visit: Payer: Self-pay

## 2013-03-27 ENCOUNTER — Telehealth: Payer: Self-pay

## 2013-03-27 ENCOUNTER — Telehealth: Payer: Self-pay | Admitting: Cardiology

## 2013-03-27 NOTE — Telephone Encounter (Signed)
The patient called and is hoping to get his insulin in pen form, rather than the vial.   Thanks!

## 2013-03-27 NOTE — Telephone Encounter (Signed)
patient called about his insulin  Dr Jenny Reichmann refill it for a vial and not the pen, and the patient doses not have any needles to do the vial because he was doing the pen

## 2013-03-27 NOTE — Telephone Encounter (Signed)
New problem    Pt needs a call about his prescription refills. His Insulin.

## 2013-03-28 ENCOUNTER — Telehealth: Payer: Self-pay | Admitting: *Deleted

## 2013-03-28 MED ORDER — INSULIN GLARGINE 100 UNIT/ML SOLOSTAR PEN: 0.1000 mL | PEN_INJECTOR | Freq: Every day | SUBCUTANEOUS | Status: DC

## 2013-03-28 NOTE — Telephone Encounter (Signed)
Received fax stating lantus rx should have been for solostar, If so do you want [place] to dispense 30 ml or 45 ml...[name]

## 2013-04-07 ENCOUNTER — Ambulatory Visit (INDEPENDENT_AMBULATORY_CARE_PROVIDER_SITE_OTHER): Payer: Medicare Other | Admitting: General Practice

## 2013-04-07 DIAGNOSIS — Z8679 Personal history of other diseases of the circulatory system: Secondary | ICD-10-CM

## 2013-04-07 DIAGNOSIS — Z7901 Long term (current) use of anticoagulants: Secondary | ICD-10-CM

## 2013-04-07 DIAGNOSIS — I4891 Unspecified atrial fibrillation: Secondary | ICD-10-CM

## 2013-04-07 LAB — POCT INR: INR: 3.1

## 2013-04-07 NOTE — Progress Notes (Signed)
Pre-visit discussion using our clinic review tool. No additional management support is needed unless otherwise documented below in the visit note.  

## 2013-04-16 ENCOUNTER — Other Ambulatory Visit (HOSPITAL_COMMUNITY): Payer: Self-pay | Admitting: Cardiology

## 2013-04-20 ENCOUNTER — Ambulatory Visit (INDEPENDENT_AMBULATORY_CARE_PROVIDER_SITE_OTHER): Payer: Medicare Other | Admitting: Cardiology

## 2013-04-20 ENCOUNTER — Encounter: Payer: Self-pay | Admitting: Cardiology

## 2013-04-20 VITALS — BP 114/84 | HR 77 | Ht 72.0 in | Wt 265.0 lb

## 2013-04-20 DIAGNOSIS — I480 Paroxysmal atrial fibrillation: Secondary | ICD-10-CM

## 2013-04-20 DIAGNOSIS — Z95810 Presence of automatic (implantable) cardiac defibrillator: Secondary | ICD-10-CM

## 2013-04-20 DIAGNOSIS — I4891 Unspecified atrial fibrillation: Secondary | ICD-10-CM

## 2013-04-20 MED ORDER — HYDRALAZINE HCL 50 MG PO TABS: 50.0000 mg | ORAL_TABLET | Freq: Three times a day (TID) | ORAL | Status: DC

## 2013-04-20 MED ORDER — POTASSIUM CHLORIDE CRYS ER 20 MEQ PO TBCR: 40.0000 meq | EXTENDED_RELEASE_TABLET | Freq: Every day | ORAL | Status: DC

## 2013-04-20 MED ORDER — AMIODARONE HCL 200 MG PO TABS: 200.0000 mg | ORAL_TABLET | Freq: Every day | ORAL | Status: DC

## 2013-04-20 MED ORDER — SPIRONOLACTONE 25 MG PO TABS: 25.0000 mg | ORAL_TABLET | Freq: Every day | ORAL | Status: DC

## 2013-04-20 MED ORDER — ISOSORBIDE DINITRATE 20 MG PO TABS: 20.0000 mg | ORAL_TABLET | Freq: Three times a day (TID) | ORAL | Status: DC

## 2013-04-20 MED ORDER — AMLODIPINE BESYLATE 10 MG PO TABS: ORAL_TABLET | ORAL | Status: DC

## 2013-04-20 MED ORDER — FUROSEMIDE 80 MG PO TABS: 80.0000 mg | ORAL_TABLET | Freq: Every day | ORAL | Status: DC

## 2013-04-20 MED ORDER — CARVEDILOL 25 MG PO TABS: 50.0000 mg | ORAL_TABLET | Freq: Two times a day (BID) | ORAL | Status: DC

## 2013-04-20 MED ORDER — ATORVASTATIN CALCIUM 40 MG PO TABS: 40.0000 mg | ORAL_TABLET | Freq: Every day | ORAL | Status: DC

## 2013-04-20 NOTE — Progress Notes (Signed)
HPI The patient presents for follow up of CHF.  The patient denies any new symptoms such as chest discomfort, neck or arm discomfort. There has been no new shortness of breath, PND or orthopnea. There have been no reported palpitations, presyncope or syncope.  He is exercising at the [facility].  He is watching his volume.    No Known Allergies  Current Outpatient Prescriptions  Medication Sig Dispense Refill  . allopurinol (ZYLOPRIM) 300 MG tablet Take 300 mg by mouth daily.      [name] amiodarone (PACERONE) 200 MG tablet Take 1 tablet (200 mg total) by mouth 2 (two) times daily.  60 tablet  0  . amLODipine (NORVASC) 10 MG tablet TAKE 1 TABLET BY MOUTH DAILY  30 tablet  1  . atorvastatin (LIPITOR) 40 MG tablet Take 1 tablet (40 mg total) by mouth daily.  90 tablet  3  . B-D ULTRAFINE III SHORT PEN 31G X 8 MM MISC USE ONCE DAILY AS DIRECTED  100 each  3  . carvedilol (COREG) 25 MG tablet Take 2 tablets (50 mg total) by mouth 2 (two) times daily with a meal.  360 tablet  3  . furosemide (LASIX) 80 MG tablet Take 80 mg by mouth daily.      . hydrALAZINE (APRESOLINE) 50 MG tablet Take 80 mg by mouth every 8 (eight) hours.      . Insulin Glargine (LANTUS SOLOSTAR) 100 UNIT/ML SOPN Inject 0.1 mLs into the skin at bedtime.  30 mL  0  . Insulin Glargine (LANTUS SOLOSTAR) 100 UNIT/ML SOPN Inject into the skin.      [name] insulin glargine (LANTUS) 100 UNIT/ML injection Inject 0.1 mLs (10 Units total) into the skin at bedtime.  40 mL  11  . isosorbide dinitrate (ISORDIL) 20 MG tablet Take 1 tablet (20 mg total) by mouth 3 (three) times daily.  270 tablet  3  . Lancets (ONETOUCH ULTRASOFT) lancets TEST AS DIRECTED  100 each  11  . metFORMIN (GLUCOPHAGE) 500 MG tablet Take 1 tablet (500 mg total) by mouth 2 (two) times daily.      . Multiple Vitamin (MULTIVITAMIN) capsule Take 1 capsule by mouth daily.       . nitroGLYCERIN (NITROSTAT) 0.4 MG SL tablet Place 1 tablet (0.4 mg total) under the tongue every 5  (five) minutes x 3 doses as needed for chest pain.  25 tablet  11  . ONE TOUCH TEST STRIPS test strip TEST AS DIRECTED  100 each  11  . potassium chloride SA (K-DUR,KLOR-CON) 20 MEQ tablet Take 40 mEq by mouth daily.      . ramipril (ALTACE) 10 MG tablet Take 10 mg by mouth daily.      [name] spironolactone (ALDACTONE) 25 MG tablet Take 1 tablet (25 mg total) by mouth daily.  30 tablet  3  . tiZANidine (ZANAFLEX) 4 MG tablet Take 4 mg by mouth every 6 (six) hours as needed.      . warfarin (COUMADIN) 5 MG tablet Take 10-15 mg by mouth daily. Mon, Tue, Wed, Fri, Sat, Sun - take 10mg  Demetrius Charity - take 7.5mg        No current facility-administered medications for this visit.    Past Medical History  Diagnosis Date  . Chronic systolic CHF (congestive heart failure)     a. Likely NICM (out of proportion to CAD). b. 2009 - EF 25-30% by echo, 01/2013: 15-20% by cath. c. s/p St. Jude ICD implantation 2007.  [name]  Gout   . Hypokalemia   . HTN (hypertension)   . Lipoma   . Dyslipidemia   . CAD (coronary artery disease)     a. Initial nonobst by cath 2009. b. Cath 01/2013 in setting of VT storm: obstructive distal Cx disease (small and terminates in the AV groove, unlikely to cause significant ischemia or electrical instability), nonobstructive RCA disease, EF 15-20%.   . Atrial fibrillation     a. Noted 05/2008 by EKG.  . Paroxysmal VT     a. s/p St. Jude ICD 2007. b. H/o paroxysmal VT/VF including VT storm 12/2012 admission prompting amio initiation.  . Nonischemic cardiomyopathy   . ICD (implantable cardiac defibrillator) in place   . OSA (obstructive sleep apnea)     does not wear cpap  . DM (diabetes mellitus)     insulin dependent  . Cerebrovascular accident     a. 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 Pulmonary HTN     a. Mild by cath 01/2013.    Past Surgical History  Procedure Laterality Date  . Cardiac catheterization      Nonobstructive coronary disease 2009  . Cardiac defibrillator placement        ICD-St. Jude  . Lipoma excision     ROS:  As stated in the HPI and negative for all other systems.  PHYSICAL EXAM BP 114/84  Pulse 77  Ht 6' (1.829 m)  Wt 265 lb (120.203 kg)  BMI 35.93 kg/m2 GENERAL:  Well appearing NECK:  No jugular venous distention, waveform within normal limits, carotid upstroke brisk and symmetric, no bruits, no thyromegaly LUNGS:  Clear to auscultation bilaterally BACK:  No CVA tenderness CHEST:  Unremarkable, well-healed ICD pocket HEART:  PMI not displaced or sustained,S1 and S2 within normal limits, no S3, no S4, no clicks, no rubs, no murmurs ABD:  Flat, positive bowel sounds normal in frequency in pitch, no bruits, no rebound, no guarding, no midline pulsatile mass, no hepatomegaly, no splenomegaly EXT:  2 plus pulses throughout, no edema, no cyanosis no clubbing   EKG:  Sinus rhythm, rate 77, interventricular conduction delay, premature ectopic complex, first degree AV block, QT prolonged, nonspecific T-wave flattening. 04/20/2013  ASSESSMENT AND PLAN  Congestive heart failure, unspecified -  He has had no new symptoms since his last echo done in 2013. He seems to be euvolemic. He'll continue the meds as listed.  HYPERTENSION -  His blood pressure is well controlled.  He will continue the medications as ordered.  Atrial fibrillation-  He has had no symptomatic paroxysms of this. He's not interested in changing from warfarin to another agent.  He is up-to-date with his labs for his amiodarone. No change in therapy is indicated.

## 2013-04-20 NOTE — Patient Instructions (Signed)
The current medical regimen is effective;  continue present plan and medications.  Follow up with Dr Lovena Le in January 2015.  Follow up in 1 year with Dr Percival Spanish.  You will receive a letter in the mail 2 months before you are due.  Please call us when you receive this letter to schedule your follow up appointment.

## 2013-05-03 ENCOUNTER — Other Ambulatory Visit (HOSPITAL_COMMUNITY): Payer: Self-pay | Admitting: Physician Assistant

## 2013-05-12 ENCOUNTER — Ambulatory Visit (INDEPENDENT_AMBULATORY_CARE_PROVIDER_SITE_OTHER): Payer: Medicare Other | Admitting: General Practice

## 2013-05-12 DIAGNOSIS — Z7901 Long term (current) use of anticoagulants: Secondary | ICD-10-CM

## 2013-05-12 DIAGNOSIS — I4891 Unspecified atrial fibrillation: Secondary | ICD-10-CM

## 2013-05-12 DIAGNOSIS — Z8679 Personal history of other diseases of the circulatory system: Secondary | ICD-10-CM

## 2013-05-12 LAB — POCT INR: INR: 2.8

## 2013-05-12 NOTE — Progress Notes (Signed)
Pre-visit discussion using our clinic review tool. No additional management support is needed unless otherwise documented below in the visit note.  

## 2013-05-24 ENCOUNTER — Encounter: Payer: Self-pay | Admitting: Internal Medicine

## 2013-05-24 ENCOUNTER — Ambulatory Visit (INDEPENDENT_AMBULATORY_CARE_PROVIDER_SITE_OTHER): Payer: Medicare Other | Admitting: Internal Medicine

## 2013-05-24 VITALS — BP 129/90 | HR 76 | Ht 72.0 in | Wt 264.0 lb

## 2013-05-24 DIAGNOSIS — I509 Heart failure, unspecified: Secondary | ICD-10-CM

## 2013-05-24 DIAGNOSIS — I428 Other cardiomyopathies: Secondary | ICD-10-CM

## 2013-05-24 DIAGNOSIS — I5022 Chronic systolic (congestive) heart failure: Secondary | ICD-10-CM

## 2013-05-24 DIAGNOSIS — I5023 Acute on chronic systolic (congestive) heart failure: Secondary | ICD-10-CM

## 2013-05-24 DIAGNOSIS — I472 Ventricular tachycardia, unspecified: Secondary | ICD-10-CM

## 2013-05-24 DIAGNOSIS — Z95810 Presence of automatic (implantable) cardiac defibrillator: Secondary | ICD-10-CM

## 2013-05-24 DIAGNOSIS — I4729 Other ventricular tachycardia: Secondary | ICD-10-CM

## 2013-05-24 LAB — MDC_IDC_ENUM_SESS_TYPE_INCLINIC
Brady Statistic RV Percent Paced: 1 %
Implantable Pulse Generator Serial Number: 311254
Lead Channel Pacing Threshold Amplitude: 1.5 V
Lead Channel Pacing Threshold Pulse Width: 0.4 ms
Lead Channel Sensing Intrinsic Amplitude: 12 mV

## 2013-05-24 NOTE — Progress Notes (Signed)
HPI [name] returns today for followup. He is a pleasant 58 yo man with a h/o non-ischemic CM, chronic systolic heart failure and VT. He received an appropriate ICD shock several months ago. The patient denies chest pain or worsening shortness of breath or peripheral edema.  He does admit to some noncompliance with his diet. He has had constipation. He had one episode where he gained 8 lbs and almost had to go to the hospital due to CHF. He took an extra lasix and then got better. He is considering joining a fitness center and exercising. No Known Allergies   Current Outpatient Prescriptions  Medication Sig Dispense Refill  . allopurinol (ZYLOPRIM) 300 MG tablet Take 300 mg by mouth daily.      [name] amiodarone (PACERONE) 200 MG tablet Take 1 tablet (200 mg total) by mouth daily.  90 tablet  3  . amLODipine (NORVASC) 10 MG tablet TAKE 1 TABLET BY MOUTH DAILY  90 tablet  3  . atorvastatin (LIPITOR) 40 MG tablet Take 1 tablet (40 mg total) by mouth daily.  90 tablet  3  . B-D ULTRAFINE III SHORT PEN 31G X 8 MM MISC USE ONCE DAILY AS DIRECTED  100 each  3  . carvedilol (COREG) 25 MG tablet Take 2 tablets (50 mg total) by mouth 2 (two) times daily with a meal.  360 tablet  3  . furosemide (LASIX) 80 MG tablet Take 1 tablet (80 mg total) by mouth daily.  90 tablet  3  . hydrALAZINE (APRESOLINE) 50 MG tablet Take 1 tablet (50 mg total) by mouth every 8 (eight) hours.  270 tablet  3  . Insulin Glargine (LANTUS SOLOSTAR) 100 UNIT/ML SOPN Inject 0.1 mLs into the skin at bedtime.  30 mL  0  . Insulin Glargine (LANTUS SOLOSTAR) 100 UNIT/ML SOPN Inject into the skin.      [name] insulin glargine (LANTUS) 100 UNIT/ML injection Inject 0.1 mLs (10 Units total) into the skin at bedtime.  40 mL  11  . isosorbide dinitrate (ISORDIL) 20 MG tablet Take 1 tablet (20 mg total) by mouth 3 (three) times daily.  270 tablet  3  . Lancets (ONETOUCH ULTRASOFT) lancets TEST AS DIRECTED  100 each  11  . metFORMIN (GLUCOPHAGE) 500 MG  tablet Take 1 tablet (500 mg total) by mouth 2 (two) times daily.      . Multiple Vitamin (MULTIVITAMIN) capsule Take 1 capsule by mouth daily.       . nitroGLYCERIN (NITROSTAT) 0.4 MG SL tablet Place 1 tablet (0.4 mg total) under the tongue every 5 (five) minutes x 3 doses as needed for chest pain.  25 tablet  11  . ONE TOUCH TEST STRIPS test strip TEST AS DIRECTED  100 each  11  . potassium chloride SA (K-DUR,KLOR-CON) 20 MEQ tablet Take 2 tablets (40 mEq total) by mouth daily.  180 tablet  3  . ramipril (ALTACE) 10 MG tablet Take 10 mg by mouth daily.      [name] spironolactone (ALDACTONE) 25 MG tablet Take 1 tablet (25 mg total) by mouth daily.  90 tablet  3  . tiZANidine (ZANAFLEX) 4 MG tablet Take 4 mg by mouth every 6 (six) hours as needed.      . warfarin (COUMADIN) 5 MG tablet Take 10-15 mg by mouth daily. Mon, Tue, Wed, Fri, Sat, Sun - take 10mg  [name] - take 7.5mg        No current facility-administered medications for this visit.  Past Medical History  Diagnosis Date  . Chronic systolic CHF (congestive heart failure)     a. Likely NICM (out of proportion to CAD). b. 2009 - EF 25-30% by echo, 01/2013: 15-20% by cath. c. s/p St. Jude ICD implantation 2007.  [name] Gout   . Hypokalemia   . HTN (hypertension)   . Lipoma   . Dyslipidemia   . CAD (coronary artery disease)     a. Initial nonobst by cath 2009. b. Cath 01/2013 in setting of VT storm: obstructive distal Cx disease (small and terminates in the AV groove, unlikely to cause significant ischemia or electrical instability), nonobstructive RCA disease, EF 15-20%.   . Atrial fibrillation     a. Noted 05/2008 by EKG.  . Paroxysmal VT     a. s/p St. Jude ICD 2007. b. H/o paroxysmal VT/VF including VT storm 12/2012 admission prompting amio initiation.  . Nonischemic cardiomyopathy   . ICD (implantable cardiac defibrillator) in place   . OSA (obstructive sleep apnea)     does not wear cpap  . DM (diabetes mellitus)     insulin dependent   . Cerebrovascular accident     a. 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 Pulmonary HTN     a. Mild by cath 01/2013.    ROS:   All systems reviewed and negative except as noted in the HPI.   Past Surgical History  Procedure Laterality Date  . Cardiac catheterization      Nonobstructive coronary disease 2009  . Cardiac defibrillator placement      ICD-St. Jude  . Lipoma excision       Family History  Problem Relation Age of Onset  . Coronary artery disease Mother      History   Social History  . Marital Status: Married    Spouse Name: N/A    Number of Children: 2  . Years of Education: N/A   Occupational History  . custodian    Social History Main Topics  . Smoking status: Former Smoker    Quit date: 05/04/1986  . Smokeless tobacco: Never Used     Comment: Quit smoking 30 yrs ago. Smoked as teenager less than 1/2 ppd. Smoked x 4 years.  . Alcohol Use: Yes     Comment: occasionally  . Drug Use: No  . Sexual Activity: No   Other Topics Concern  . Not on file   Social History Narrative  . No narrative on file     BP 129/90  Pulse 76  Ht 6' (1.829 m)  Wt 264 lb (119.75 kg)  BMI 35.80 kg/m2  Physical Exam:  Well appearing middle-aged man, NAD HEENT: Unremarkable Neck:  7 cm JVD, no thyromegall Back:  No CVA tenderness Lungs:  Clear with no wheezes, rales, or rhonchi. HEART:  IRegular rate rhythm, no murmurs, no rubs, no clicks Abd:  soft, positive bowel sounds, no organomegally, no rebound, no guarding Ext:  2 plus pulses, no edema, no cyanosis, no clubbing Skin:  No rashes no nodules Neuro:  CN II through XII intact, motor grossly intact   DEVICE  Normal device function.  See PaceArt for details.   Assess/Plan:

## 2013-05-24 NOTE — Assessment & Plan Note (Signed)
He will continue his amiodarone. No VT since his last visit.

## 2013-05-24 NOTE — Assessment & Plan Note (Signed)
His symptoms are class 2. I discussed the importance of sodium restriction and taking meds and exercise. No change in meds.

## 2013-05-24 NOTE — Patient Instructions (Addendum)
Your physician recommends that you continue on your current medications as directed. Please refer to the Current Medication list given to you today.  Your physician recommends that you schedule a follow-up appointment in: 2 months with device clinic.  Your physician wants you to follow-up in: 1 year with Dr. Lovena Le.  You will receive a reminder letter in the mail two months in advance. If you don't receive a letter, please call our office to schedule the follow-up appointment.   You may take Miralax or Colace over-the-counter for constipation.

## 2013-05-24 NOTE — Assessment & Plan Note (Signed)
His St. Jude device is approaching ERI. We will follow.

## 2013-06-23 ENCOUNTER — Ambulatory Visit (INDEPENDENT_AMBULATORY_CARE_PROVIDER_SITE_OTHER): Payer: Medicare Other | Admitting: General Practice

## 2013-06-23 DIAGNOSIS — I4891 Unspecified atrial fibrillation: Secondary | ICD-10-CM

## 2013-06-23 DIAGNOSIS — Z7901 Long term (current) use of anticoagulants: Secondary | ICD-10-CM

## 2013-06-23 DIAGNOSIS — Z8679 Personal history of other diseases of the circulatory system: Secondary | ICD-10-CM

## 2013-06-23 DIAGNOSIS — Z5181 Encounter for therapeutic drug level monitoring: Secondary | ICD-10-CM

## 2013-06-23 LAB — POCT INR: INR: 2.7

## 2013-06-23 NOTE — Progress Notes (Signed)
Pre visit review using our clinic review tool, if applicable. No additional management support is needed unless otherwise documented below in the visit note. 

## 2013-06-27 ENCOUNTER — Other Ambulatory Visit: Payer: Self-pay | Admitting: Internal Medicine

## 2013-06-28 ENCOUNTER — Telehealth: Payer: Self-pay | Admitting: Cardiology

## 2013-06-28 NOTE — Telephone Encounter (Signed)
New message  Patient wants 11 RFS on all medications, please call and advise.

## 2013-06-28 NOTE — Telephone Encounter (Signed)
Pt requested refills on allopurinol  - requested he obtain from Dr Jenny Reichmann

## 2013-07-26 ENCOUNTER — Ambulatory Visit (INDEPENDENT_AMBULATORY_CARE_PROVIDER_SITE_OTHER): Payer: Medicare Other | Admitting: *Deleted

## 2013-07-26 DIAGNOSIS — I509 Heart failure, unspecified: Secondary | ICD-10-CM

## 2013-07-26 DIAGNOSIS — I5023 Acute on chronic systolic (congestive) heart failure: Secondary | ICD-10-CM

## 2013-07-26 LAB — MDC_IDC_ENUM_SESS_TYPE_INCLINIC
Implantable Pulse Generator Serial Number: 311254
Lead Channel Pacing Threshold Amplitude: 1.25 V
Lead Channel Pacing Threshold Pulse Width: 0.8 ms
Lead Channel Sensing Intrinsic Amplitude: 12.1 mV
Lead Channel Setting Pacing Amplitude: 2.5 V
Lead Channel Setting Pacing Pulse Width: 0.8 ms
Lead Channel Setting Sensing Sensitivity: 0.3 mV
MDC IDC MSMT BATTERY VOLTAGE: 2.51 V
MDC IDC MSMT LEADCHNL RV IMPEDANCE VALUE: 445 Ohm
MDC IDC SESS DTM: 20150325102328
MDC IDC SET ZONE DETECTION INTERVAL: 250 ms
MDC IDC SET ZONE DETECTION INTERVAL: 280 ms
MDC IDC SET ZONE DETECTION INTERVAL: 320 ms
MDC IDC STAT BRADY RV PERCENT PACED: 0 %

## 2013-07-26 NOTE — Progress Notes (Signed)
ICD check in clinic. Normal device function. Thresholds and sensing consistent with previous device measurements. Impedance trends stable over time. No evidence of any ventricular arrhythmias.Histogram distribution appropriate for patient and level of activity. No changes made this session. Device programmed at appropriate safety margins. Device programmed to optimize intrinsic conduction.  Patient education completed including shock plan. Alert tones/vibration demonstrated for patient. Patient with SJM Atlas ICD. Device battery  is <2.55V. Patient to be placed on accelerated follow-up to evaluate for ERI.  ROV 1 month for battery check only.

## 2013-08-03 ENCOUNTER — Encounter: Payer: Self-pay | Admitting: Internal Medicine

## 2013-08-11 ENCOUNTER — Ambulatory Visit (INDEPENDENT_AMBULATORY_CARE_PROVIDER_SITE_OTHER): Payer: Medicare Other | Admitting: General Practice

## 2013-08-11 DIAGNOSIS — Z8679 Personal history of other diseases of the circulatory system: Secondary | ICD-10-CM

## 2013-08-11 DIAGNOSIS — Z7901 Long term (current) use of anticoagulants: Secondary | ICD-10-CM

## 2013-08-11 DIAGNOSIS — Z5181 Encounter for therapeutic drug level monitoring: Secondary | ICD-10-CM

## 2013-08-11 DIAGNOSIS — I4891 Unspecified atrial fibrillation: Secondary | ICD-10-CM

## 2013-08-11 LAB — POCT INR: INR: 2.7

## 2013-08-11 NOTE — Progress Notes (Signed)
Pre visit review using our clinic review tool, if applicable. No additional management support is needed unless otherwise documented below in the visit note. 

## 2013-08-30 ENCOUNTER — Ambulatory Visit (INDEPENDENT_AMBULATORY_CARE_PROVIDER_SITE_OTHER): Payer: Medicare Other | Admitting: *Deleted

## 2013-08-30 DIAGNOSIS — I472 Ventricular tachycardia, unspecified: Secondary | ICD-10-CM

## 2013-08-30 DIAGNOSIS — I509 Heart failure, unspecified: Secondary | ICD-10-CM

## 2013-08-30 DIAGNOSIS — I428 Other cardiomyopathies: Secondary | ICD-10-CM

## 2013-08-30 DIAGNOSIS — I5023 Acute on chronic systolic (congestive) heart failure: Secondary | ICD-10-CM

## 2013-08-30 DIAGNOSIS — I4729 Other ventricular tachycardia: Secondary | ICD-10-CM

## 2013-08-30 LAB — MDC_IDC_ENUM_SESS_TYPE_INCLINIC
Battery Voltage: 2.5 V
Brady Statistic RV Percent Paced: 1 %
Date Time Interrogation Session: 20150429125751
Implantable Pulse Generator Serial Number: 311254
Lead Channel Impedance Value: 440 Ohm
Lead Channel Sensing Intrinsic Amplitude: 12.1 mV
Lead Channel Setting Pacing Amplitude: 2.5 V
Lead Channel Setting Pacing Pulse Width: 0.8 ms
Lead Channel Setting Sensing Sensitivity: 0.3 mV
Zone Setting Detection Interval: 250 ms
Zone Setting Detection Interval: 280 ms
Zone Setting Detection Interval: 320 ms

## 2013-08-31 NOTE — Progress Notes (Signed)
ICD check in clinic for batt longevity only (N/C). Batt voltage 2.5V (ERI 2.45V). Testing was not performed this OV. No episodes. Patient will follow up with the device clinic on 6-3 (billable) and with GT in Jan 2016.

## 2013-09-18 ENCOUNTER — Encounter: Payer: Self-pay | Admitting: Internal Medicine

## 2013-09-19 ENCOUNTER — Ambulatory Visit (INDEPENDENT_AMBULATORY_CARE_PROVIDER_SITE_OTHER): Payer: Medicare Other | Admitting: General Practice

## 2013-09-19 ENCOUNTER — Encounter: Payer: Self-pay | Admitting: Internal Medicine

## 2013-09-19 ENCOUNTER — Ambulatory Visit (INDEPENDENT_AMBULATORY_CARE_PROVIDER_SITE_OTHER): Payer: Medicare Other | Admitting: Internal Medicine

## 2013-09-19 ENCOUNTER — Telehealth: Payer: Self-pay | Admitting: Internal Medicine

## 2013-09-19 VITALS — BP 120/82 | HR 72 | Temp 97.8°F | Ht 72.0 in | Wt 265.0 lb

## 2013-09-19 DIAGNOSIS — Z8679 Personal history of other diseases of the circulatory system: Secondary | ICD-10-CM

## 2013-09-19 DIAGNOSIS — Z0000 Encounter for general adult medical examination without abnormal findings: Secondary | ICD-10-CM

## 2013-09-19 DIAGNOSIS — Z5181 Encounter for therapeutic drug level monitoring: Secondary | ICD-10-CM

## 2013-09-19 DIAGNOSIS — I4891 Unspecified atrial fibrillation: Secondary | ICD-10-CM

## 2013-09-19 DIAGNOSIS — E119 Type 2 diabetes mellitus without complications: Secondary | ICD-10-CM

## 2013-09-19 DIAGNOSIS — E785 Hyperlipidemia, unspecified: Secondary | ICD-10-CM

## 2013-09-19 DIAGNOSIS — Z7901 Long term (current) use of anticoagulants: Secondary | ICD-10-CM

## 2013-09-19 DIAGNOSIS — I10 Essential (primary) hypertension: Secondary | ICD-10-CM

## 2013-09-19 DIAGNOSIS — IMO0001 Reserved for inherently not codable concepts without codable children: Secondary | ICD-10-CM

## 2013-09-19 DIAGNOSIS — E1165 Type 2 diabetes mellitus with hyperglycemia: Secondary | ICD-10-CM

## 2013-09-19 LAB — POCT INR: INR: 2.8

## 2013-09-19 MED ORDER — METFORMIN HCL 500 MG PO TABS: 500.0000 mg | ORAL_TABLET | Freq: Two times a day (BID) | ORAL | Status: DC

## 2013-09-19 MED ORDER — ALLOPURINOL 300 MG PO TABS: 300.0000 mg | ORAL_TABLET | Freq: Every day | ORAL | Status: DC

## 2013-09-19 MED ORDER — INSULIN GLARGINE 100 UNIT/ML SOLOSTAR PEN: 10.0000 [IU] | PEN_INJECTOR | Freq: Every day | SUBCUTANEOUS | Status: DC

## 2013-09-19 NOTE — Patient Instructions (Signed)
Please continue all other medications as before, and refills have been done if requested. Please have the pharmacy call with any other refills you may need.  Please continue your efforts at being more active, low cholesterol diet, and weight control.  Please keep your appointments with your specialists as you have planned  No further lab work needed today  Please remember to sign up for MyChart if you have not done so, as this will be important to you in the future with finding out test results, communicating by private email, and scheduling acute appointments online when needed.  Please return in 6 months, or sooner if needed, with Lab testing done 3-5 days before

## 2013-09-19 NOTE — Progress Notes (Signed)
Pre visit review using our clinic review tool, if applicable. No additional management support is needed unless otherwise documented below in the visit note. 

## 2013-09-19 NOTE — Assessment & Plan Note (Signed)
stable overall by history and exam, recent data reviewed with pt, and pt to continue medical treatment as before,  to f/u any worsening symptoms or concerns Lab Results  Component Value Date   HGBA1C 5.5 03/21/2013   Cont same tx, watch for low sugars with more exercise,  to f/u any worsening symptoms or concerns

## 2013-09-19 NOTE — Progress Notes (Signed)
Subjective:    Patient ID: [name], male    DOB: 1955-12-25, 58 y.o.   MRN: [id number]  HPI  Here to f/u; overall doing ok,  Pt denies chest pain, increased sob or doe, wheezing, orthopnea, PND, increased LE swelling, palpitations, dizziness or syncope.  Pt denies polydipsia, polyuria, or low sugar symptoms such as weakness or confusion improved with po intake.  Pt denies new neurological symptoms such as new headache, or facial or extremity weakness or numbness.   Pt states overall good compliance with meds, has been trying to follow lower cholesterol, diabetic diet, with wt overall stable,  but little exercise however. Had bioq labs done last wk - a1c 5.2, LDL 94.  Starting to exercise more with a treadmill at home Past Medical History  Diagnosis Date  . Chronic systolic CHF (congestive heart failure)     a. Likely NICM (out of proportion to CAD). b. 2009 - EF 25-30% by echo, 01/2013: 15-20% by cath. c. s/p St. Jude ICD implantation 2007.  [name] Gout   . Hypokalemia   . HTN (hypertension)   . Lipoma   . Dyslipidemia   . CAD (coronary artery disease)     a. Initial nonobst by cath 2009. b. Cath 01/2013 in setting of VT storm: obstructive distal Cx disease (small and terminates in the AV groove, unlikely to cause significant ischemia or electrical instability), nonobstructive RCA disease, EF 15-20%.   . Atrial fibrillation     a. Noted 05/2008 by EKG.  . Paroxysmal VT     a. s/p St. Jude ICD 2007. b. H/o paroxysmal VT/VF including VT storm 12/2012 admission prompting amio initiation.  . Nonischemic cardiomyopathy   . ICD (implantable cardiac defibrillator) in place   . OSA (obstructive sleep apnea)     does not wear cpap  . DM (diabetes mellitus)     insulin dependent  . Cerebrovascular accident     a. 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 Pulmonary HTN     a. Mild by cath 01/2013.   Past Surgical History  Procedure Laterality Date  . Cardiac catheterization      Nonobstructive  coronary disease 2009  . Cardiac defibrillator placement      ICD-St. Jude  . Lipoma excision      reports that he quit smoking about 27 years ago. He has never used smokeless tobacco. He reports that he drinks alcohol. He reports that he does not use illicit drugs. family history includes Coronary artery disease in his mother. No Known Allergies Current Outpatient Prescriptions on File Prior to Visit  Medication Sig Dispense Refill  . amiodarone (PACERONE) 200 MG tablet Take 1 tablet (200 mg total) by mouth daily.  90 tablet  3  . amLODipine (NORVASC) 10 MG tablet TAKE 1 TABLET BY MOUTH DAILY  90 tablet  3  . atorvastatin (LIPITOR) 40 MG tablet Take 1 tablet (40 mg total) by mouth daily.  90 tablet  3  . B-D ULTRAFINE III SHORT PEN 31G X 8 MM MISC USE ONCE DAILY AS DIRECTED  100 each  3  . carvedilol (COREG) 25 MG tablet Take 2 tablets (50 mg total) by mouth 2 (two) times daily with a meal.  360 tablet  3  . furosemide (LASIX) 80 MG tablet Take 1 tablet (80 mg total) by mouth daily.  90 tablet  3  . hydrALAZINE (APRESOLINE) 50 MG tablet Take 1 tablet (50 mg total) by mouth every 8 (eight) hours.  270 tablet  3  . isosorbide dinitrate (ISORDIL) 20 MG tablet Take 1 tablet (20 mg total) by mouth 3 (three) times daily.  270 tablet  3  . Lancets (ONETOUCH ULTRASOFT) lancets TEST AS DIRECTED  100 each  11  . Multiple Vitamin (MULTIVITAMIN) capsule Take 1 capsule by mouth daily.       . nitroGLYCERIN (NITROSTAT) 0.4 MG SL tablet Place 1 tablet (0.4 mg total) under the tongue every 5 (five) minutes x 3 doses as needed for chest pain.  25 tablet  11  . ONE TOUCH TEST STRIPS test strip TEST AS DIRECTED  100 each  11  . potassium chloride SA (K-DUR,KLOR-CON) 20 MEQ tablet Take 2 tablets (40 mEq total) by mouth daily.  180 tablet  3  . ramipril (ALTACE) 10 MG tablet Take 10 mg by mouth daily.      [name] spironolactone (ALDACTONE) 25 MG tablet Take 1 tablet (25 mg total) by mouth daily.  90 tablet  3  .  tiZANidine (ZANAFLEX) 4 MG tablet Take 4 mg by mouth every 6 (six) hours as needed.      . warfarin (COUMADIN) 5 MG tablet Take 10-15 mg by mouth daily. Mon, Tue, Wed, Fri, Sat, Sun - take 10mg  Demetrius Charity - take 7.5mg        No current facility-administered medications on file prior to visit.    Review of Systems  Constitutional: Negative for unusual diaphoresis or other sweats  HENT: Negative for ringing in ear Eyes: Negative for double vision or worsening visual disturbance.  Respiratory: Negative for choking and stridor.   Gastrointestinal: Negative for vomiting or other signifcant bowel change Genitourinary: Negative for hematuria or decreased urine volume.  Musculoskeletal: Negative for other MSK pain or swelling Skin: Negative for color change and worsening wound.  Neurological: Negative for tremors and numbness other than noted  Psychiatric/Behavioral: Negative for decreased concentration or agitation other than above  '    Objective:   Physical Exam BP 120/82  Pulse 72  Temp(Src) 97.8 F (36.6 C) (Oral)  Ht 6' (1.829 m)  Wt 265 lb (120.203 kg)  BMI 35.93 kg/m2  SpO2 92% VS noted,  Constitutional: Pt appears well-developed, well-nourished.  HENT: Head: NCAT.  Right Ear: External ear normal.  Left Ear: External ear normal.  Eyes: . Pupils are equal, round, and reactive to light. Conjunctivae and EOM are normal Neck: Normal range of motion. Neck supple.  Cardiovascular: Normal rate and regular rhythm.   Pulmonary/Chest: Effort normal and breath sounds normal.  Abd:  Soft, NT, ND, + BS Neurological: Pt is alert. Not confused , motor grossly intact Skin: Skin is warm. No rash Psychiatric: Pt behavior is normal. No agitation.     Assessment & Plan:

## 2013-09-19 NOTE — Assessment & Plan Note (Signed)
stable overall by history and exam, recent data reviewed with pt, and pt to continue medical treatment as before,  to f/u any worsening symptoms or concerns Lab Results  Component Value Date   LDLCALC 106* 03/21/2013

## 2013-09-19 NOTE — Telephone Encounter (Signed)
Relevant patient education mailed to patient.  

## 2013-09-19 NOTE — Assessment & Plan Note (Signed)
stable overall by history and exam, recent data reviewed with pt, and pt to continue medical treatment as before,  to f/u any worsening symptoms or concerns BP Readings from Last 3 Encounters:  09/19/13 120/82  05/24/13 129/90  04/20/13 114/84

## 2013-10-04 ENCOUNTER — Ambulatory Visit (INDEPENDENT_AMBULATORY_CARE_PROVIDER_SITE_OTHER): Payer: Medicare Other | Admitting: *Deleted

## 2013-10-04 DIAGNOSIS — I4891 Unspecified atrial fibrillation: Secondary | ICD-10-CM

## 2013-10-04 DIAGNOSIS — I428 Other cardiomyopathies: Secondary | ICD-10-CM

## 2013-10-04 LAB — MDC_IDC_ENUM_SESS_TYPE_INCLINIC
Battery Voltage: 2.48 V
Date Time Interrogation Session: 20150603101003
Implantable Pulse Generator Serial Number: 311254
Lead Channel Pacing Threshold Amplitude: 1.25 V
Lead Channel Setting Sensing Sensitivity: 0.3 mV
MDC IDC MSMT LEADCHNL RV IMPEDANCE VALUE: 445 Ohm
MDC IDC MSMT LEADCHNL RV PACING THRESHOLD PULSEWIDTH: 0.8 ms
MDC IDC MSMT LEADCHNL RV SENSING INTR AMPL: 12.1 mV
MDC IDC SET LEADCHNL RV PACING AMPLITUDE: 2.5 V
MDC IDC SET LEADCHNL RV PACING PULSEWIDTH: 0.8 ms
MDC IDC SET ZONE DETECTION INTERVAL: 280 ms
MDC IDC STAT BRADY RV PERCENT PACED: 1 %
Zone Setting Detection Interval: 250 ms
Zone Setting Detection Interval: 320 ms

## 2013-10-04 NOTE — Progress Notes (Signed)
ICD check in clinic. Normal device function. Thresholds and sensing consistent with previous device measurements. Impedance trends stable over time. No evidence of any ventricular arrhythmias. Histogram distribution appropriate for patient and level of activity. No changes made this session. Device programmed at appropriate safety margins. Device programmed to optimize intrinsic conduction. Battery voltage 2.48 V. ROV 11-09-13 @ 1000 for battery check only. Next billable in September.

## 2013-10-19 ENCOUNTER — Encounter: Payer: Self-pay | Admitting: Internal Medicine

## 2013-10-31 ENCOUNTER — Ambulatory Visit (INDEPENDENT_AMBULATORY_CARE_PROVIDER_SITE_OTHER): Payer: Medicare Other | Admitting: General Practice

## 2013-10-31 DIAGNOSIS — Z8679 Personal history of other diseases of the circulatory system: Secondary | ICD-10-CM

## 2013-10-31 DIAGNOSIS — Z7901 Long term (current) use of anticoagulants: Secondary | ICD-10-CM

## 2013-10-31 DIAGNOSIS — Z5181 Encounter for therapeutic drug level monitoring: Secondary | ICD-10-CM

## 2013-10-31 DIAGNOSIS — I4891 Unspecified atrial fibrillation: Secondary | ICD-10-CM

## 2013-10-31 LAB — POCT INR: INR: 3.9

## 2013-10-31 NOTE — Progress Notes (Signed)
Pre visit review using our clinic review tool, if applicable. No additional management support is needed unless otherwise documented below in the visit note. 

## 2013-11-06 ENCOUNTER — Other Ambulatory Visit: Payer: Self-pay

## 2013-11-06 MED ORDER — INSULIN PEN NEEDLE 31G X 8 MM MISC: Status: DC

## 2013-11-07 ENCOUNTER — Other Ambulatory Visit: Payer: Self-pay | Admitting: *Deleted

## 2013-11-07 MED ORDER — RAMIPRIL 10 MG PO CAPS: 10.0000 mg | ORAL_CAPSULE | Freq: Every day | ORAL | Status: DC

## 2013-11-09 ENCOUNTER — Encounter: Payer: Self-pay | Admitting: Internal Medicine

## 2013-11-09 ENCOUNTER — Ambulatory Visit (INDEPENDENT_AMBULATORY_CARE_PROVIDER_SITE_OTHER): Payer: Medicare Other | Admitting: *Deleted

## 2013-11-09 DIAGNOSIS — I428 Other cardiomyopathies: Secondary | ICD-10-CM

## 2013-11-09 LAB — MDC_IDC_ENUM_SESS_TYPE_INCLINIC
Implantable Pulse Generator Serial Number: 311254
Lead Channel Setting Pacing Amplitude: 2.5 V
Lead Channel Setting Pacing Pulse Width: 0.8 ms
Lead Channel Setting Sensing Sensitivity: 0.3 mV
MDC IDC SET ZONE DETECTION INTERVAL: 280 ms
MDC IDC SET ZONE DETECTION INTERVAL: 320 ms
Zone Setting Detection Interval: 250 ms

## 2013-11-09 NOTE — Progress Notes (Signed)
Battery check only today.  2.47V.  ROV in August for recheck.

## 2013-11-19 ENCOUNTER — Other Ambulatory Visit: Payer: Self-pay | Admitting: Internal Medicine

## 2013-11-21 ENCOUNTER — Other Ambulatory Visit: Payer: Self-pay | Admitting: General Practice

## 2013-11-21 MED ORDER — WARFARIN SODIUM 5 MG PO TABS: ORAL_TABLET | ORAL | Status: DC

## 2013-11-23 ENCOUNTER — Encounter: Payer: Self-pay | Admitting: Internal Medicine

## 2013-11-23 LAB — HM DIABETES EYE EXAM

## 2013-12-05 ENCOUNTER — Ambulatory Visit (INDEPENDENT_AMBULATORY_CARE_PROVIDER_SITE_OTHER): Payer: Medicare Other | Admitting: Family Medicine

## 2013-12-05 DIAGNOSIS — I4891 Unspecified atrial fibrillation: Secondary | ICD-10-CM

## 2013-12-05 DIAGNOSIS — Z5181 Encounter for therapeutic drug level monitoring: Secondary | ICD-10-CM

## 2013-12-05 DIAGNOSIS — Z8679 Personal history of other diseases of the circulatory system: Secondary | ICD-10-CM

## 2013-12-05 DIAGNOSIS — Z7901 Long term (current) use of anticoagulants: Secondary | ICD-10-CM

## 2013-12-05 LAB — POCT INR: INR: 3.4

## 2013-12-14 ENCOUNTER — Ambulatory Visit (INDEPENDENT_AMBULATORY_CARE_PROVIDER_SITE_OTHER): Payer: Medicare Other | Admitting: *Deleted

## 2013-12-14 DIAGNOSIS — Z4502 Encounter for adjustment and management of automatic implantable cardiac defibrillator: Secondary | ICD-10-CM

## 2013-12-15 LAB — MDC_IDC_ENUM_SESS_TYPE_INCLINIC
Battery Voltage: 2.43 V
Brady Statistic RV Percent Paced: 1 % — CL
Implantable Pulse Generator Serial Number: 311254
Lead Channel Impedance Value: 425 Ohm
Lead Channel Sensing Intrinsic Amplitude: 12 mV
Lead Channel Setting Pacing Amplitude: 2.5 V
Lead Channel Setting Pacing Pulse Width: 0.8 ms
Lead Channel Setting Sensing Sensitivity: 0.3 mV
MDC IDC SET ZONE DETECTION INTERVAL: 250 ms
Zone Setting Detection Interval: 280 ms
Zone Setting Detection Interval: 320 ms

## 2013-12-15 NOTE — Progress Notes (Signed)
Battery only. Battery reached ERI mid July, last max charge on 11/21/13. ROV w/ Dr. Lovena Le 01/03/14.

## 2013-12-27 ENCOUNTER — Ambulatory Visit (INDEPENDENT_AMBULATORY_CARE_PROVIDER_SITE_OTHER): Payer: Medicare Other | Admitting: *Deleted

## 2013-12-27 DIAGNOSIS — Z7901 Long term (current) use of anticoagulants: Secondary | ICD-10-CM

## 2013-12-27 DIAGNOSIS — Z8679 Personal history of other diseases of the circulatory system: Secondary | ICD-10-CM

## 2013-12-27 DIAGNOSIS — Z5181 Encounter for therapeutic drug level monitoring: Secondary | ICD-10-CM

## 2013-12-27 DIAGNOSIS — I4891 Unspecified atrial fibrillation: Secondary | ICD-10-CM

## 2013-12-27 LAB — POCT INR: INR: 3

## 2014-01-03 ENCOUNTER — Ambulatory Visit (INDEPENDENT_AMBULATORY_CARE_PROVIDER_SITE_OTHER): Payer: Medicare Other | Admitting: Internal Medicine

## 2014-01-03 ENCOUNTER — Encounter: Payer: Self-pay | Admitting: *Deleted

## 2014-01-03 ENCOUNTER — Encounter: Payer: Self-pay | Admitting: Internal Medicine

## 2014-01-03 VITALS — BP 110/50 | HR 70 | Ht 72.0 in | Wt 264.0 lb

## 2014-01-03 DIAGNOSIS — I472 Ventricular tachycardia, unspecified: Secondary | ICD-10-CM

## 2014-01-03 DIAGNOSIS — Z95810 Presence of automatic (implantable) cardiac defibrillator: Secondary | ICD-10-CM

## 2014-01-03 DIAGNOSIS — I4729 Other ventricular tachycardia: Secondary | ICD-10-CM

## 2014-01-03 DIAGNOSIS — I428 Other cardiomyopathies: Secondary | ICD-10-CM

## 2014-01-03 DIAGNOSIS — I5022 Chronic systolic (congestive) heart failure: Secondary | ICD-10-CM

## 2014-01-03 DIAGNOSIS — Z01812 Encounter for preprocedural laboratory examination: Secondary | ICD-10-CM

## 2014-01-03 NOTE — Assessment & Plan Note (Signed)
His St. Jude ICD is at [facility]. He has VT. Will schedule ICD generator change.

## 2014-01-03 NOTE — Assessment & Plan Note (Signed)
His VT appears to be quiet with the last episode nearly a year ago. No change in meds.

## 2014-01-03 NOTE — Addendum Note (Signed)
Addended by: Stanton Kidney on: 01/03/2014 03:22 PM   Modules accepted: Orders

## 2014-01-03 NOTE — Addendum Note (Signed)
Addended by: Stanton Kidney on: 01/03/2014 04:27 PM   Modules accepted: Orders

## 2014-01-03 NOTE — Progress Notes (Signed)
HPI [name] returns today for followup. He is a pleasant 58 yo man with a h/o non-ischemic CM, chronic systolic heart failure and VT. He received an appropriate ICD shock several months ago. The patient denies chest pain or worsening shortness of breath or peripheral edema.  He does admit to some noncompliance with his diet. He has reached ERI on his ICD. His EF was 25% by echo 2 years ago.  No Known Allergies   Current Outpatient Prescriptions  Medication Sig Dispense Refill  . allopurinol (ZYLOPRIM) 300 MG tablet Take 1 tablet (300 mg total) by mouth daily.  90 tablet  3  . amiodarone (PACERONE) 200 MG tablet Take 1 tablet (200 mg total) by mouth daily.  90 tablet  3  . amLODipine (NORVASC) 10 MG tablet TAKE 1 TABLET BY MOUTH DAILY  90 tablet  3  . atorvastatin (LIPITOR) 40 MG tablet Take 1 tablet (40 mg total) by mouth daily.  90 tablet  3  . carvedilol (COREG) 25 MG tablet Take 2 tablets (50 mg total) by mouth 2 (two) times daily with a meal.  360 tablet  3  . furosemide (LASIX) 80 MG tablet Take 1 tablet (80 mg total) by mouth daily.  90 tablet  3  . hydrALAZINE (APRESOLINE) 50 MG tablet Take 1 tablet (50 mg total) by mouth every 8 (eight) hours.  270 tablet  3  . Insulin Glargine (LANTUS SOLOSTAR) 100 UNIT/ML Solostar Pen Inject 10 Units into the skin at bedtime.  30 mL  11  . Insulin Pen Needle (B-D ULTRAFINE III SHORT PEN) 31G X 8 MM MISC Use once daily with insulin.  Diagnosis  100 each  3  . isosorbide dinitrate (ISORDIL) 20 MG tablet Take 1 tablet (20 mg total) by mouth 3 (three) times daily.  270 tablet  3  . Lancets (ONETOUCH ULTRASOFT) lancets TEST AS DIRECTED  100 each  11  . metFORMIN (GLUCOPHAGE) 500 MG tablet Take 1 tablet (500 mg total) by mouth 2 (two) times daily.  180 tablet  3  . Multiple Vitamin (MULTIVITAMIN) capsule Take 1 capsule by mouth daily.       . nitroGLYCERIN (NITROSTAT) 0.4 MG SL tablet Place 1 tablet (0.4 mg total) under the tongue every 5 (five) minutes x 3  doses as needed for chest pain.  25 tablet  11  . ONE TOUCH TEST STRIPS test strip TEST AS DIRECTED  100 each  11  . potassium chloride SA (K-DUR,KLOR-CON) 20 MEQ tablet Take 2 tablets (40 mEq total) by mouth daily.  180 tablet  3  . ramipril (ALTACE) 10 MG capsule Take 1 capsule (10 mg total) by mouth daily.  90 capsule  1  . spironolactone (ALDACTONE) 25 MG tablet Take 1 tablet (25 mg total) by mouth daily.  90 tablet  3  . tiZANidine (ZANAFLEX) 4 MG tablet Take 4 mg by mouth every 6 (six) hours as needed.      . warfarin (COUMADIN) 5 MG tablet Take as directed by anticoagulation clinic  180 tablet  1   No current facility-administered medications for this visit.     Past Medical History  Diagnosis Date  . Chronic systolic CHF (congestive heart failure)     a. Likely NICM (out of proportion to CAD). b. 2009 - EF 25-30% by echo, 01/2013: 15-20% by cath. c. s/p St. Jude ICD implantation 2007.  [name] Gout   . Hypokalemia   . HTN (hypertension)   . Lipoma   .  Dyslipidemia   . CAD (coronary artery disease)     a. Initial nonobst by cath 2009. b. Cath 01/2013 in setting of VT storm: obstructive distal Cx disease (small and terminates in the AV groove, unlikely to cause significant ischemia or electrical instability), nonobstructive RCA disease, EF 15-20%.   . Atrial fibrillation     a. Noted 05/2008 by EKG.  . Paroxysmal VT     a. s/p St. Jude ICD 2007. b. H/o paroxysmal VT/VF including VT storm 12/2012 admission prompting amio initiation.  . Nonischemic cardiomyopathy   . ICD (implantable cardiac defibrillator) in place   . OSA (obstructive sleep apnea)     does not wear cpap  . DM (diabetes mellitus)     insulin dependent  . Cerebrovascular accident     a. 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 Pulmonary HTN     a. Mild by cath 01/2013.    ROS:   All systems reviewed and negative except as noted in the HPI.   Past Surgical History  Procedure Laterality Date  . Cardiac catheterization       Nonobstructive coronary disease 2009  . Cardiac defibrillator placement      ICD-St. Jude  . Lipoma excision       Family History  Problem Relation Age of Onset  . Coronary artery disease Mother      History   Social History  . Marital Status: Married    Spouse Name: N/A    Number of Children: 2  . Years of Education: N/A   Occupational History  . custodian    Social History Main Topics  . Smoking status: Former Smoker    Quit date: 05/04/1986  . Smokeless tobacco: Never Used     Comment: Quit smoking 30 yrs ago. Smoked as teenager less than 1/2 ppd. Smoked x 4 years.  . Alcohol Use: Yes     Comment: occasionally  . Drug Use: No  . Sexual Activity: No   Other Topics Concern  . Not on file   Social History Narrative  . No narrative on file     BP 110/50  Pulse 70  Ht 6' (1.829 m)  Wt 264 lb (119.75 kg)  BMI 35.80 kg/m2  Physical Exam:  Well appearing middle-aged man, NAD HEENT: Unremarkable Neck:  6 cm JVD, no thyromegall Back:  No CVA tenderness Lungs:  Clear with no wheezes, rales, or rhonchi. HEART:  IRegular rate rhythm, no murmurs, no rubs, no clicks Abd:  soft, positive bowel sounds, no organomegally, no rebound, no guarding Ext:  2 plus pulses, no edema, no cyanosis, no clubbing Skin:  No rashes no nodules Neuro:  CN II through XII intact, motor grossly intact   DEVICE  Normal device function.  See PaceArt for details.   Assess/Plan:

## 2014-01-03 NOTE — Patient Instructions (Addendum)
Your physician recommends that you continue on your current medications as directed. Please refer to the Current Medication list given to you today.  Your physician recommends that you return for pre--procedure lab work on 01/09/14.  Your physician has recommended that you have a defibrillator generator change on 01/15/14. Please see the instruction sheet given to you today for more information.  Your wound check is scheduled for 01/25/14 at 10:30 a.m. at [address].

## 2014-01-05 ENCOUNTER — Other Ambulatory Visit: Payer: Self-pay | Admitting: *Deleted

## 2014-01-05 DIAGNOSIS — I428 Other cardiomyopathies: Secondary | ICD-10-CM

## 2014-01-09 ENCOUNTER — Other Ambulatory Visit (INDEPENDENT_AMBULATORY_CARE_PROVIDER_SITE_OTHER): Payer: Medicare Other

## 2014-01-09 DIAGNOSIS — Z01812 Encounter for preprocedural laboratory examination: Secondary | ICD-10-CM

## 2014-01-09 DIAGNOSIS — I5022 Chronic systolic (congestive) heart failure: Secondary | ICD-10-CM

## 2014-01-09 DIAGNOSIS — I428 Other cardiomyopathies: Secondary | ICD-10-CM

## 2014-01-09 LAB — BASIC METABOLIC PANEL
BUN: 20 mg/dL (ref 6–23)
CHLORIDE: 109 meq/L (ref 96–112)
CO2: 28 meq/L (ref 19–32)
CREATININE: 1.4 mg/dL (ref 0.4–1.5)
Calcium: 8.7 mg/dL (ref 8.4–10.5)
GFR: 69.88 mL/min (ref >60.00)
Glucose, Bld: 98 mg/dL (ref 70–99)
Potassium: 3.6 mEq/L (ref 3.5–5.1)
Sodium: 141 mEq/L (ref 135–145)

## 2014-01-09 LAB — CBC WITH DIFFERENTIAL/PLATELET
Basophils Absolute: 0 10*3/uL (ref 0.0–0.1)
Basophils Relative: 0.5 % (ref 0.0–3.0)
Eosinophils Absolute: 0.1 10*3/uL (ref 0.0–0.7)
Eosinophils Relative: 1.4 % (ref 0.0–5.0)
HCT: 38.8 % — ABNORMAL LOW (ref 39.0–52.0)
Hemoglobin: 12.7 g/dL — ABNORMAL LOW (ref 13.0–17.0)
Lymphocytes Relative: 15.1 % (ref 12.0–46.0)
Lymphs Abs: 0.8 10*3/uL (ref 0.7–4.0)
MCHC: 32.7 g/dL (ref 30.0–36.0)
MCV: 98 fl (ref 78.0–100.0)
MONO ABS: 0.4 10*3/uL (ref 0.1–1.0)
Monocytes Relative: 8.2 % (ref 3.0–12.0)
NEUTROS PCT: 74.8 % (ref 43.0–77.0)
Neutro Abs: 4 10*3/uL (ref 1.4–7.7)
PLATELETS: 188 10*3/uL (ref 150.0–400.0)
RBC: 3.96 Mil/uL — ABNORMAL LOW (ref 4.22–5.81)
RDW: 14.2 % (ref 11.5–15.5)
WBC: 5.4 10*3/uL (ref 4.0–10.5)

## 2014-01-09 LAB — PROTIME-INR
INR: 3.9 ratio — AB (ref 0.8–1.0)
PROTHROMBIN TIME: 41.3 s — AB (ref 9.6–13.1)

## 2014-01-10 ENCOUNTER — Encounter: Payer: Self-pay | Admitting: Internal Medicine

## 2014-01-11 ENCOUNTER — Telehealth: Payer: Self-pay | Admitting: Internal Medicine

## 2014-01-11 NOTE — Telephone Encounter (Signed)
Spoke with patient   Dr Lovena Le recommends to take regular dose tonight, 1/2 dose tomorrow night and HOLD Sat And Sun

## 2014-01-11 NOTE — Telephone Encounter (Signed)
New message     Pt is having a procedure on Monday---please call and let him know if he needs to stop his coumadin prior to procedure

## 2014-01-12 ENCOUNTER — Other Ambulatory Visit: Payer: Self-pay

## 2014-01-12 ENCOUNTER — Encounter (HOSPITAL_COMMUNITY): Payer: Self-pay | Admitting: Pharmacy Technician

## 2014-01-12 MED ORDER — NITROGLYCERIN 0.4 MG SL SUBL: 0.4000 mg | SUBLINGUAL_TABLET | SUBLINGUAL | Status: DC | PRN

## 2014-01-14 DIAGNOSIS — Z794 Long term (current) use of insulin: Secondary | ICD-10-CM | POA: Diagnosis not present

## 2014-01-14 DIAGNOSIS — Z87891 Personal history of nicotine dependence: Secondary | ICD-10-CM | POA: Diagnosis not present

## 2014-01-14 DIAGNOSIS — Z91199 Patient's noncompliance with other medical treatment and regimen due to unspecified reason: Secondary | ICD-10-CM | POA: Diagnosis not present

## 2014-01-14 DIAGNOSIS — E119 Type 2 diabetes mellitus without complications: Secondary | ICD-10-CM | POA: Diagnosis not present

## 2014-01-14 DIAGNOSIS — I2510 Atherosclerotic heart disease of native coronary artery without angina pectoris: Secondary | ICD-10-CM | POA: Diagnosis not present

## 2014-01-14 DIAGNOSIS — Z4502 Encounter for adjustment and management of automatic implantable cardiac defibrillator: Secondary | ICD-10-CM | POA: Diagnosis present

## 2014-01-14 DIAGNOSIS — G4733 Obstructive sleep apnea (adult) (pediatric): Secondary | ICD-10-CM | POA: Diagnosis not present

## 2014-01-14 DIAGNOSIS — I4891 Unspecified atrial fibrillation: Secondary | ICD-10-CM | POA: Diagnosis not present

## 2014-01-14 DIAGNOSIS — I509 Heart failure, unspecified: Secondary | ICD-10-CM | POA: Diagnosis not present

## 2014-01-14 DIAGNOSIS — I4729 Other ventricular tachycardia: Secondary | ICD-10-CM | POA: Diagnosis not present

## 2014-01-14 DIAGNOSIS — I472 Ventricular tachycardia: Secondary | ICD-10-CM | POA: Diagnosis not present

## 2014-01-14 DIAGNOSIS — I5022 Chronic systolic (congestive) heart failure: Secondary | ICD-10-CM | POA: Diagnosis not present

## 2014-01-14 DIAGNOSIS — Z9119 Patient's noncompliance with other medical treatment and regimen: Secondary | ICD-10-CM | POA: Diagnosis not present

## 2014-01-14 DIAGNOSIS — I2789 Other specified pulmonary heart diseases: Secondary | ICD-10-CM | POA: Diagnosis not present

## 2014-01-14 DIAGNOSIS — I428 Other cardiomyopathies: Secondary | ICD-10-CM | POA: Diagnosis not present

## 2014-01-14 DIAGNOSIS — M109 Gout, unspecified: Secondary | ICD-10-CM | POA: Diagnosis not present

## 2014-01-14 DIAGNOSIS — I10 Essential (primary) hypertension: Secondary | ICD-10-CM | POA: Diagnosis not present

## 2014-01-14 DIAGNOSIS — Z8673 Personal history of transient ischemic attack (TIA), and cerebral infarction without residual deficits: Secondary | ICD-10-CM | POA: Diagnosis not present

## 2014-01-14 MED ORDER — SODIUM CHLORIDE 0.9 % IV SOLN
INTRAVENOUS | Status: DC
  Administered 2014-01-15: 06:00:00 via INTRAVENOUS

## 2014-01-14 MED ORDER — CEFAZOLIN SODIUM-DEXTROSE 2-3 GM-% IV SOLR: 2.0000 g | INTRAVENOUS | Status: DC

## 2014-01-14 MED ORDER — SODIUM CHLORIDE 0.9 % IR SOLN
80.0000 mg | Status: DC
  Filled 2014-01-14: qty 2

## 2014-01-14 MED ORDER — CHLORHEXIDINE GLUCONATE 4 % EX LIQD
60.0000 mL | Freq: Once | CUTANEOUS | Status: DC
  Filled 2014-01-14: qty 60

## 2014-01-15 ENCOUNTER — Ambulatory Visit (HOSPITAL_COMMUNITY)
Admission: RE | Admit: 2014-01-15 | Discharge: 2014-01-15 | Disposition: A | Discharge status: Home / Self Care | Payer: Medicare Other | Source: Ambulatory Visit | Attending: Internal Medicine | Admitting: Internal Medicine

## 2014-01-15 ENCOUNTER — Encounter (HOSPITAL_COMMUNITY)
Admission: RE | Disposition: A | Discharge status: Home / Self Care | Payer: Self-pay | Source: Ambulatory Visit | Attending: Internal Medicine

## 2014-01-15 ENCOUNTER — Telehealth: Payer: Self-pay | Admitting: Internal Medicine

## 2014-01-15 DIAGNOSIS — E119 Type 2 diabetes mellitus without complications: Secondary | ICD-10-CM | POA: Insufficient documentation

## 2014-01-15 DIAGNOSIS — I472 Ventricular tachycardia, unspecified: Secondary | ICD-10-CM | POA: Insufficient documentation

## 2014-01-15 DIAGNOSIS — Z9119 Patient's noncompliance with other medical treatment and regimen: Secondary | ICD-10-CM | POA: Insufficient documentation

## 2014-01-15 DIAGNOSIS — Z794 Long term (current) use of insulin: Secondary | ICD-10-CM | POA: Insufficient documentation

## 2014-01-15 DIAGNOSIS — M109 Gout, unspecified: Secondary | ICD-10-CM | POA: Insufficient documentation

## 2014-01-15 DIAGNOSIS — I2789 Other specified pulmonary heart diseases: Secondary | ICD-10-CM | POA: Insufficient documentation

## 2014-01-15 DIAGNOSIS — Z4502 Encounter for adjustment and management of automatic implantable cardiac defibrillator: Secondary | ICD-10-CM | POA: Insufficient documentation

## 2014-01-15 DIAGNOSIS — I5022 Chronic systolic (congestive) heart failure: Secondary | ICD-10-CM | POA: Insufficient documentation

## 2014-01-15 DIAGNOSIS — I509 Heart failure, unspecified: Secondary | ICD-10-CM | POA: Diagnosis not present

## 2014-01-15 DIAGNOSIS — I4729 Other ventricular tachycardia: Secondary | ICD-10-CM | POA: Insufficient documentation

## 2014-01-15 DIAGNOSIS — Z8673 Personal history of transient ischemic attack (TIA), and cerebral infarction without residual deficits: Secondary | ICD-10-CM | POA: Insufficient documentation

## 2014-01-15 DIAGNOSIS — I428 Other cardiomyopathies: Secondary | ICD-10-CM | POA: Diagnosis not present

## 2014-01-15 DIAGNOSIS — I10 Essential (primary) hypertension: Secondary | ICD-10-CM | POA: Insufficient documentation

## 2014-01-15 DIAGNOSIS — G4733 Obstructive sleep apnea (adult) (pediatric): Secondary | ICD-10-CM | POA: Insufficient documentation

## 2014-01-15 DIAGNOSIS — Z91199 Patient's noncompliance with other medical treatment and regimen due to unspecified reason: Secondary | ICD-10-CM | POA: Insufficient documentation

## 2014-01-15 DIAGNOSIS — I4891 Unspecified atrial fibrillation: Secondary | ICD-10-CM | POA: Insufficient documentation

## 2014-01-15 DIAGNOSIS — I2510 Atherosclerotic heart disease of native coronary artery without angina pectoris: Secondary | ICD-10-CM | POA: Insufficient documentation

## 2014-01-15 DIAGNOSIS — Z87891 Personal history of nicotine dependence: Secondary | ICD-10-CM | POA: Insufficient documentation

## 2014-01-15 HISTORY — PX: IMPLANTABLE CARDIOVERTER DEFIBRILLATOR (ICD) GENERATOR CHANGE: SHX5469

## 2014-01-15 LAB — GLUCOSE, CAPILLARY
GLUCOSE-CAPILLARY: 92 mg/dL (ref 70–99)
Glucose-Capillary: 87 mg/dL (ref 70–99)
Glucose-Capillary: 77 mg/dL (ref 70–99)

## 2014-01-15 LAB — PROTIME-INR
INR: 1.66 — AB (ref 0.00–1.49)
Prothrombin Time: 19.6 seconds — ABNORMAL HIGH (ref 11.6–15.2)

## 2014-01-15 LAB — SURGICAL PCR SCREEN
MRSA, PCR: NEGATIVE
STAPHYLOCOCCUS AUREUS: NEGATIVE

## 2014-01-15 SURGERY — ICD GENERATOR CHANGE
Anesthesia: LOCAL

## 2014-01-15 MED ORDER — MIDAZOLAM HCL 5 MG/5ML IJ SOLN
INTRAMUSCULAR | Status: AC
  Filled 2014-01-15: qty 5

## 2014-01-15 MED ORDER — ACETAMINOPHEN 325 MG PO TABS
325.0000 mg | ORAL_TABLET | ORAL | Status: DC | PRN
  Administered 2014-01-15: 650 mg via ORAL
  Filled 2014-01-15: qty 2

## 2014-01-15 MED ORDER — MUPIROCIN 2 % EX OINT
TOPICAL_OINTMENT | Freq: Two times a day (BID) | CUTANEOUS | Status: DC
  Administered 2014-01-15: 1 via NASAL
  Filled 2014-01-15: qty 22

## 2014-01-15 MED ORDER — LIDOCAINE HCL (PF) 1 % IJ SOLN
INTRAMUSCULAR | Status: AC
Start: 2014-01-15 — End: 2014-01-15
  Filled 2014-01-15: qty 30

## 2014-01-15 MED ORDER — FENTANYL CITRATE 0.05 MG/ML IJ SOLN
INTRAMUSCULAR | Status: AC
  Filled 2014-01-15: qty 2

## 2014-01-15 MED ORDER — MUPIROCIN 2 % EX OINT
TOPICAL_OINTMENT | CUTANEOUS | Status: AC
  Administered 2014-01-15: 1 via NASAL
  Filled 2014-01-15: qty 22

## 2014-01-15 MED ORDER — LIDOCAINE HCL (PF) 1 % IJ SOLN
INTRAMUSCULAR | Status: AC
  Filled 2014-01-15: qty 30

## 2014-01-15 MED ORDER — CEFAZOLIN SODIUM-DEXTROSE 2-3 GM-% IV SOLR
INTRAVENOUS | Status: AC
  Filled 2014-01-15: qty 50

## 2014-01-15 MED ORDER — ONDANSETRON HCL 4 MG/2ML IJ SOLN: 4.0000 mg | Freq: Four times a day (QID) | INTRAMUSCULAR | Status: DC | PRN

## 2014-01-15 NOTE — H&P (View-Only) (Signed)
HPI [name] returns today for followup. He is a pleasant 58 yo man with a h/o non-ischemic CM, chronic systolic heart failure and VT. He received an appropriate ICD shock several months ago. The patient denies chest pain or worsening shortness of breath or peripheral edema.  He does admit to some noncompliance with his diet. He has reached ERI on his ICD. His EF was 25% by echo 2 years ago.  No Known Allergies   Current Outpatient Prescriptions  Medication Sig Dispense Refill  . allopurinol (ZYLOPRIM) 300 MG tablet Take 1 tablet (300 mg total) by mouth daily.  90 tablet  3  . amiodarone (PACERONE) 200 MG tablet Take 1 tablet (200 mg total) by mouth daily.  90 tablet  3  . amLODipine (NORVASC) 10 MG tablet TAKE 1 TABLET BY MOUTH DAILY  90 tablet  3  . atorvastatin (LIPITOR) 40 MG tablet Take 1 tablet (40 mg total) by mouth daily.  90 tablet  3  . carvedilol (COREG) 25 MG tablet Take 2 tablets (50 mg total) by mouth 2 (two) times daily with a meal.  360 tablet  3  . furosemide (LASIX) 80 MG tablet Take 1 tablet (80 mg total) by mouth daily.  90 tablet  3  . hydrALAZINE (APRESOLINE) 50 MG tablet Take 1 tablet (50 mg total) by mouth every 8 (eight) hours.  270 tablet  3  . Insulin Glargine (LANTUS SOLOSTAR) 100 UNIT/ML Solostar Pen Inject 10 Units into the skin at bedtime.  30 mL  11  . Insulin Pen Needle (B-D ULTRAFINE III SHORT PEN) 31G X 8 MM MISC Use once daily with insulin.  Diagnosis  100 each  3  . isosorbide dinitrate (ISORDIL) 20 MG tablet Take 1 tablet (20 mg total) by mouth 3 (three) times daily.  270 tablet  3  . Lancets (ONETOUCH ULTRASOFT) lancets TEST AS DIRECTED  100 each  11  . metFORMIN (GLUCOPHAGE) 500 MG tablet Take 1 tablet (500 mg total) by mouth 2 (two) times daily.  180 tablet  3  . Multiple Vitamin (MULTIVITAMIN) capsule Take 1 capsule by mouth daily.       . nitroGLYCERIN (NITROSTAT) 0.4 MG SL tablet Place 1 tablet (0.4 mg total) under the tongue every 5 (five) minutes x 3  doses as needed for chest pain.  25 tablet  11  . ONE TOUCH TEST STRIPS test strip TEST AS DIRECTED  100 each  11  . potassium chloride SA (K-DUR,KLOR-CON) 20 MEQ tablet Take 2 tablets (40 mEq total) by mouth daily.  180 tablet  3  . ramipril (ALTACE) 10 MG capsule Take 1 capsule (10 mg total) by mouth daily.  90 capsule  1  . spironolactone (ALDACTONE) 25 MG tablet Take 1 tablet (25 mg total) by mouth daily.  90 tablet  3  . tiZANidine (ZANAFLEX) 4 MG tablet Take 4 mg by mouth every 6 (six) hours as needed.      . warfarin (COUMADIN) 5 MG tablet Take as directed by anticoagulation clinic  180 tablet  1   No current facility-administered medications for this visit.     Past Medical History  Diagnosis Date  . Chronic systolic CHF (congestive heart failure)     a. Likely NICM (out of proportion to CAD). b. 2009 - EF 25-30% by echo, 01/2013: 15-20% by cath. c. s/p St. Jude ICD implantation 2007.  [name] Gout   . Hypokalemia   . HTN (hypertension)   . Lipoma   .  Dyslipidemia   . CAD (coronary artery disease)     a. Initial nonobst by cath 2009. b. Cath 01/2013 in setting of VT storm: obstructive distal Cx disease (small and terminates in the AV groove, unlikely to cause significant ischemia or electrical instability), nonobstructive RCA disease, EF 15-20%.   . Atrial fibrillation     a. Noted 05/2008 by EKG.  . Paroxysmal VT     a. s/p St. Jude ICD 2007. b. H/o paroxysmal VT/VF including VT storm 12/2012 admission prompting amio initiation.  . Nonischemic cardiomyopathy   . ICD (implantable cardiac defibrillator) in place   . OSA (obstructive sleep apnea)     does not wear cpap  . DM (diabetes mellitus)     insulin dependent  . Cerebrovascular accident     a. 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 Pulmonary HTN     a. Mild by cath 01/2013.    ROS:   All systems reviewed and negative except as noted in the HPI.   Past Surgical History  Procedure Laterality Date  . Cardiac catheterization       Nonobstructive coronary disease 2009  . Cardiac defibrillator placement      ICD-St. Jude  . Lipoma excision       Family History  Problem Relation Age of Onset  . Coronary artery disease Mother      History   Social History  . Marital Status: Married    Spouse Name: N/A    Number of Children: 2  . Years of Education: N/A   Occupational History  . custodian    Social History Main Topics  . Smoking status: Former Smoker    Quit date: 05/04/1986  . Smokeless tobacco: Never Used     Comment: Quit smoking 30 yrs ago. Smoked as teenager less than 1/2 ppd. Smoked x 4 years.  . Alcohol Use: Yes     Comment: occasionally  . Drug Use: No  . Sexual Activity: No   Other Topics Concern  . Not on file   Social History Narrative  . No narrative on file     BP 110/50  Pulse 70  Ht 6' (1.829 m)  Wt 264 lb (119.75 kg)  BMI 35.80 kg/m2  Physical Exam:  Well appearing middle-aged man, NAD HEENT: Unremarkable Neck:  6 cm JVD, no thyromegall Back:  No CVA tenderness Lungs:  Clear with no wheezes, rales, or rhonchi. HEART:  IRegular rate rhythm, no murmurs, no rubs, no clicks Abd:  soft, positive bowel sounds, no organomegally, no rebound, no guarding Ext:  2 plus pulses, no edema, no cyanosis, no clubbing Skin:  No rashes no nodules Neuro:  CN II through XII intact, motor grossly intact   DEVICE  Normal device function.  See PaceArt for details.   Assess/Plan:

## 2014-01-15 NOTE — H&P (Signed)
  ICD Criteria  Current LVEF:25% ;Obtained > or = 1 month ago and < or = 3 months ago.  NYHA Functional Classification: Class II  Heart Failure History:  Yes, Duration of heart failure since onset is > 9 months  Non-Ischemic Dilated Cardiomyopathy History:  Yes, timeframe is > 9 months  Atrial Fibrillation/Atrial Flutter:  yes  Ventricular Tachycardia History:  Yes, Hemodynamic instability present, VT Type:  SVT - Monomorphic.  Cardiac Arrest History:  No  History of Syndromes with Risk of Sudden Death:  No.  Previous ICD:  Yes, ICD Type:  Single, Reason for ICD:  Secondary, reason for secondary prevention:  Ventricular Tachycardia  Electrophysiology Study: No.  Prior MI: No.  PPM: No.  OSA:  Yes  Patient Life Expectancy of >=1 year: Yes.  Anticoagulation Therapy:  Patient is on anticoagulation therapy, anticoagulation was held prior to procedure.   Beta Blocker Therapy:  Yes.   Ace Inhibitor/ARB Therapy:  No, Reason not on Ace Inhibitor/ARB therapy:  hypotension

## 2014-01-15 NOTE — Progress Notes (Signed)
IV saline locked. 

## 2014-01-15 NOTE — CV Procedure (Signed)
Electrophysiology procedure note  Procedure: Removal of a previous implanted ICD which had reached elective replacement, and insertion of a new ICD, with defibrillation threshold testing.  Preoperative diagnosis: Nonischemic cardiomyopathy, chronic class II systolic heart failure, ejection fraction 25%, sustained monomorphic ventricular tachycardia  Postoperative diagnoses: Same as preoperative diagnoses  Description of the procedure: After informed consent was obtained, the patient was taken to the diagnostic electrophysiology laboratory in the fasting state. After the usual preparation and draping, intravenous Versed and fentanyl were used for sedation. 30 cc of lidocaine was infiltrated into the left pectoral region. A 6 cm incision was carried out and electrocautery was utilized to dissect down to the ICD pocket. The generator was removed with gentle traction. Electrocautery was utilized to free up the dense fibrous adhesions. The new St. Jude ICD, serial number [id number] was connected to the old ICD lead and placed back in the subcutaneous pocket. Antibiotic irrigation was utilized to irrigate the pocket. Electrocautery was utilized to assure hemostasis. The incision was closed with 3 layers of Vicryl suture. At this point I scrubbed out of the case to supervise defibrillation threshold testing.  After the patient was deeply sedated under my direct supervision with additional Versed and fentanyl, ventricular fibrillation was induced with a T-wave shock. An initial 20 J shock failed to terminate ventricular fibrillation. The device successfully re-detected, recharged, and defibrillated the patient with 30 J. At this point benzoin and Steri-Strips for pain on the skin, a pressure dressing was applied, and the patient was returned to his room in satisfactory condition.  Complications: No immediate procedure complications and estimated blood loss was negligible, less than 10 cc.  Conclusion: Successful  removal of a previous implanted St. Jude ICD which had reached elective replacement, and insertion of a new St. Jude ICD with defibrillation threshold testing.  Cristopher Peru, M.D.

## 2014-01-15 NOTE — Discharge Instructions (Signed)
Pacemaker Battery Change, Care After °Refer to this sheet in the next few weeks. These instructions provide you with information on caring for yourself after your procedure. Your health care provider may also give you more specific instructions. Your treatment has been planned according to current medical practices, but problems sometimes occur. Call your health care provider if you have any problems or questions after your procedure. °WHAT TO EXPECT AFTER THE PROCEDURE °After your procedure, it is typical to have the following sensations: °· Soreness at the pacemaker site. °HOME CARE INSTRUCTIONS  °· Keep the incision clean and dry. °· Unless advised otherwise, you may shower beginning 48 hours after your procedure. °· For the first week after the replacement, avoid stretching motions that pull at the incision site, and avoid heavy exercise with the arm that is on the same side as the incision. °· Take medicines only as directed by your health care provider. °· Keep all follow-up visits as directed by your health care provider. °SEEK MEDICAL CARE IF:  °· You have pain at the incision site that is not relieved by over-the-counter or prescription medicine. °· There is drainage or pus from the incision site. °· There is swelling larger than a lime at the incision site. °· You develop red streaking that extends above or below the incision site. °· You feel brief, intermittent palpitations, light-headedness, or any symptoms that you feel might be related to your heart. °SEEK IMMEDIATE MEDICAL CARE IF:  °· You experience chest pain that is different than the pain at the pacemaker site. °· You experience shortness of breath. °· You have palpitations or irregular heartbeat. °· You have light-headedness that does not go away quickly. °· You faint. °· You have pain that gets worse and is not relieved by medicine. °Document Released: 02/08/2013 Document Revised: 09/04/2013 Document Reviewed: 02/08/2013 °[employer]® Patient  Information ©2015 [employer]. This information is not intended to replace advice given to you by your health care provider. Make sure you discuss any questions you have with your health care provider. ° °

## 2014-01-15 NOTE — Interval H&P Note (Signed)
History and Physical Interval Note:  01/15/2014 7:32 AM  [name]  has presented today for surgery, with the diagnosis of NICM  The various methods of treatment have been discussed with the patient and family. After consideration of risks, benefits and other options for treatment, the patient has consented to  Procedure(s): ICD GENERATOR CHANGE (N/A) as a surgical intervention .  The patient's history has been reviewed, patient examined, no change in status, stable for surgery.  I have reviewed the patient's chart and labs.  Questions were answered to the patient's satisfaction.     Mikle Bosworth.D.

## 2014-01-15 NOTE — Telephone Encounter (Signed)
Spoke with patient and advised him to take his medications now and take his second dose later tonight.  Advised to remove big bandage in 24 hours and the steri-strips will be removed at wound check.  He verbalized understanding

## 2014-01-15 NOTE — Telephone Encounter (Signed)
New message     Pt had a procedure this am---he has questions

## 2014-01-25 ENCOUNTER — Ambulatory Visit (INDEPENDENT_AMBULATORY_CARE_PROVIDER_SITE_OTHER): Payer: Medicare Other

## 2014-01-25 ENCOUNTER — Ambulatory Visit (INDEPENDENT_AMBULATORY_CARE_PROVIDER_SITE_OTHER): Payer: Medicare Other | Admitting: *Deleted

## 2014-01-25 DIAGNOSIS — Z7901 Long term (current) use of anticoagulants: Secondary | ICD-10-CM

## 2014-01-25 DIAGNOSIS — Z8679 Personal history of other diseases of the circulatory system: Secondary | ICD-10-CM

## 2014-01-25 DIAGNOSIS — Z5181 Encounter for therapeutic drug level monitoring: Secondary | ICD-10-CM

## 2014-01-25 DIAGNOSIS — I4891 Unspecified atrial fibrillation: Secondary | ICD-10-CM

## 2014-01-25 DIAGNOSIS — I428 Other cardiomyopathies: Secondary | ICD-10-CM

## 2014-01-25 LAB — MDC_IDC_ENUM_SESS_TYPE_INCLINIC
Brady Statistic RV Percent Paced: 0 %
Date Time Interrogation Session: 20150924104932
HighPow Impedance: 44.3111
Implantable Pulse Generator Serial Number: 7214340
Lead Channel Impedance Value: 412.5 Ohm
Lead Channel Pacing Threshold Pulse Width: 0.7 ms
Lead Channel Sensing Intrinsic Amplitude: 12 mV
Lead Channel Setting Pacing Pulse Width: 0.7 ms
MDC IDC MSMT BATTERY REMAINING LONGEVITY: 102 mo
MDC IDC MSMT LEADCHNL RV PACING THRESHOLD AMPLITUDE: 1.25 V
MDC IDC SET LEADCHNL RV PACING AMPLITUDE: 2.5 V
MDC IDC SET LEADCHNL RV SENSING SENSITIVITY: 0.5 mV
MDC IDC SET ZONE DETECTION INTERVAL: 250 ms
MDC IDC SET ZONE DETECTION INTERVAL: 320 ms
Zone Setting Detection Interval: 280 ms

## 2014-01-25 LAB — POCT INR: INR: 2.1

## 2014-01-25 NOTE — Progress Notes (Signed)
Wound check appointment. Steri-strips removed. Wound without redness or edema. Incision edges approximated, wound well healed. Normal device function. Thresholds, sensing, and impedances consistent with implant measurements. Histogram distribution appropriate for patient and level of activity. No ventricular arrhythmias noted. Patient educated about wound care, arm mobility, lifting restrictions, shock plan. ROV in 3 months with GT.

## 2014-02-08 ENCOUNTER — Encounter: Payer: Self-pay | Admitting: Internal Medicine

## 2014-02-28 ENCOUNTER — Ambulatory Visit (INDEPENDENT_AMBULATORY_CARE_PROVIDER_SITE_OTHER): Payer: Medicare Other | Admitting: *Deleted

## 2014-02-28 DIAGNOSIS — Z23 Encounter for immunization: Secondary | ICD-10-CM

## 2014-02-28 DIAGNOSIS — Z7901 Long term (current) use of anticoagulants: Secondary | ICD-10-CM

## 2014-02-28 DIAGNOSIS — I4891 Unspecified atrial fibrillation: Secondary | ICD-10-CM

## 2014-02-28 DIAGNOSIS — Z5181 Encounter for therapeutic drug level monitoring: Secondary | ICD-10-CM

## 2014-02-28 DIAGNOSIS — Z8679 Personal history of other diseases of the circulatory system: Secondary | ICD-10-CM

## 2014-02-28 LAB — POCT INR: INR: 2.6

## 2014-03-23 ENCOUNTER — Other Ambulatory Visit (INDEPENDENT_AMBULATORY_CARE_PROVIDER_SITE_OTHER): Payer: Medicare Other

## 2014-03-23 ENCOUNTER — Encounter: Payer: Self-pay | Admitting: Internal Medicine

## 2014-03-23 ENCOUNTER — Ambulatory Visit (INDEPENDENT_AMBULATORY_CARE_PROVIDER_SITE_OTHER): Payer: Medicare Other | Admitting: Internal Medicine

## 2014-03-23 VITALS — BP 120/82 | HR 69 | Temp 98.0°F | Ht 72.0 in | Wt 266.5 lb

## 2014-03-23 DIAGNOSIS — Z125 Encounter for screening for malignant neoplasm of prostate: Secondary | ICD-10-CM

## 2014-03-23 DIAGNOSIS — E0821 Diabetes mellitus due to underlying condition with diabetic nephropathy: Secondary | ICD-10-CM

## 2014-03-23 DIAGNOSIS — E119 Type 2 diabetes mellitus without complications: Secondary | ICD-10-CM

## 2014-03-23 DIAGNOSIS — E785 Hyperlipidemia, unspecified: Secondary | ICD-10-CM | POA: Diagnosis not present

## 2014-03-23 DIAGNOSIS — I10 Essential (primary) hypertension: Secondary | ICD-10-CM | POA: Diagnosis not present

## 2014-03-23 DIAGNOSIS — Z0000 Encounter for general adult medical examination without abnormal findings: Secondary | ICD-10-CM

## 2014-03-23 DIAGNOSIS — IMO0002 Reserved for concepts with insufficient information to code with codable children: Secondary | ICD-10-CM

## 2014-03-23 DIAGNOSIS — E1165 Type 2 diabetes mellitus with hyperglycemia: Secondary | ICD-10-CM | POA: Diagnosis not present

## 2014-03-23 DIAGNOSIS — Z23 Encounter for immunization: Secondary | ICD-10-CM

## 2014-03-23 LAB — CBC WITH DIFFERENTIAL/PLATELET
BASOS ABS: 0 10*3/uL (ref 0.0–0.1)
Basophils Relative: 0.7 % (ref 0.0–3.0)
EOS ABS: 0.1 10*3/uL (ref 0.0–0.7)
Eosinophils Relative: 1.2 % (ref 0.0–5.0)
HCT: 40.7 % (ref 39.0–52.0)
Hemoglobin: 13 g/dL (ref 13.0–17.0)
LYMPHS PCT: 16.7 % (ref 12.0–46.0)
Lymphs Abs: 1 10*3/uL (ref 0.7–4.0)
MCHC: 32 g/dL (ref 30.0–36.0)
MCV: 96.7 fl (ref 78.0–100.0)
Monocytes Absolute: 0.5 10*3/uL (ref 0.1–1.0)
Monocytes Relative: 8.7 % (ref 3.0–12.0)
NEUTROS PCT: 72.7 % (ref 43.0–77.0)
Neutro Abs: 4.3 10*3/uL (ref 1.4–7.7)
Platelets: 229 10*3/uL (ref 150.0–400.0)
RBC: 4.21 Mil/uL — ABNORMAL LOW (ref 4.22–5.81)
RDW: 14.5 % (ref 11.5–15.5)
WBC: 5.9 10*3/uL (ref 4.0–10.5)

## 2014-03-23 LAB — URINALYSIS, ROUTINE W REFLEX MICROSCOPIC
Bilirubin Urine: NEGATIVE
HGB URINE DIPSTICK: NEGATIVE
Ketones, ur: NEGATIVE
Leukocytes, UA: NEGATIVE
NITRITE: NEGATIVE
Specific Gravity, Urine: 1.02 (ref 1.000–1.030)
Total Protein, Urine: NEGATIVE
Urine Glucose: NEGATIVE
Urobilinogen, UA: 4 — AB (ref 0.0–1.0)
pH: 6 (ref 5.0–8.0)

## 2014-03-23 LAB — LIPID PANEL
Cholesterol: 152 mg/dL (ref 0–200)
HDL: 33 mg/dL — AB (ref >39.00)
LDL Cholesterol: 105 mg/dL — ABNORMAL HIGH (ref 0–99)
NONHDL: 119
Total CHOL/HDL Ratio: 5
Triglycerides: 70 mg/dL (ref 0.0–149.0)
VLDL: 14 mg/dL (ref 0.0–40.0)

## 2014-03-23 LAB — BASIC METABOLIC PANEL
BUN: 19 mg/dL (ref 6–23)
CO2: 30 meq/L (ref 19–32)
CREATININE: 1.4 mg/dL (ref 0.4–1.5)
Calcium: 9.2 mg/dL (ref 8.4–10.5)
Chloride: 107 mEq/L (ref 96–112)
GFR: 69.24 mL/min (ref >60.00)
Glucose, Bld: 98 mg/dL (ref 70–99)
Potassium: 4 mEq/L (ref 3.5–5.1)
Sodium: 142 mEq/L (ref 135–145)

## 2014-03-23 LAB — HEPATIC FUNCTION PANEL
ALK PHOS: 60 U/L (ref 39–117)
ALT: 20 U/L (ref 0–53)
AST: 18 U/L (ref 0–37)
Albumin: 3.9 g/dL (ref 3.5–5.2)
BILIRUBIN DIRECT: 0.2 mg/dL (ref 0.0–0.3)
TOTAL PROTEIN: 7.1 g/dL (ref 6.0–8.3)
Total Bilirubin: 1.5 mg/dL — ABNORMAL HIGH (ref 0.2–1.2)

## 2014-03-23 LAB — TSH: TSH: 1.21 u[IU]/mL (ref 0.35–4.50)

## 2014-03-23 LAB — HEMOGLOBIN A1C: Hgb A1c MFr Bld: 5.2 % (ref 4.6–6.5)

## 2014-03-23 LAB — MICROALBUMIN / CREATININE URINE RATIO
Creatinine,U: 328.6 mg/dL
Microalb Creat Ratio: 0.6 mg/g (ref 0.0–30.0)
Microalb, Ur: 1.9 mg/dL (ref 0.0–1.9)

## 2014-03-23 LAB — PSA: PSA: 0.52 ng/mL (ref 0.10–4.00)

## 2014-03-23 MED ORDER — INSULIN PEN NEEDLE 31G X 8 MM MISC: Status: DC

## 2014-03-23 NOTE — Patient Instructions (Addendum)
You had the new Prevnar pneumonia shot today  Please continue all other medications as before, and refills have been done if requested.  Please have the pharmacy call with any other refills you may need.  Please continue your efforts at being more active, low cholesterol diet, and weight control.  You are otherwise up to date with prevention measures today.  Please keep your appointments with your specialists as you may have planned  Please go to the LAB in the Basement (turn left off the elevator) for the tests to be done today  You will be contacted by phone if any changes need to be made immediately.  Otherwise, you will receive a letter about your results with an explanation, but please check with MyChart first.  Please return in 6 months, or sooner if needed

## 2014-03-23 NOTE — Progress Notes (Signed)
Pre visit review using our clinic review tool, if applicable. No additional management support is needed unless otherwise documented below in the visit note. 

## 2014-03-23 NOTE — Addendum Note (Signed)
Addended by: Sharon Seller B on: 03/23/2014 10:47 AM   Modules accepted: Orders

## 2014-03-23 NOTE — Assessment & Plan Note (Signed)

## 2014-03-23 NOTE — Assessment & Plan Note (Signed)
stable overall by history and exam, recent data reviewed with pt, and pt to continue medical treatment as before,  to f/u any worsening symptoms or concerns Lab Results  Component Value Date   HGBA1C 5.5 03/21/2013

## 2014-03-23 NOTE — Progress Notes (Signed)
Subjective:    Patient ID: [name], male    DOB: July 29, 1955, 58 y.o.   MRN: [id number]  HPI  Here for wellness and f/u;  Overall doing ok;  Pt denies CP, worsening SOB, DOE, wheezing, orthopnea, PND, worsening LE edema, palpitations, dizziness or syncope.  Pt denies neurological change such as new headache, facial or extremity weakness.  Pt denies polydipsia, polyuria, or low sugar symptoms. Pt states overall good compliance with treatment and medications, good tolerability, and has been trying to follow lower cholesterol diet.  Pt denies worsening depressive symptoms, suicidal ideation or panic. No fever, night sweats, wt loss, loss of appetite, or other constitutional symptoms.  Pt states good ability with ADL's, has low fall risk, home safety reviewed and adequate, no other significant changes in hearing or vision, and only occasionally active with exercise.  No current complaints  Past Medical History  Diagnosis Date  . Chronic systolic CHF (congestive heart failure)     a. Likely NICM (out of proportion to CAD). b. 2009 - EF 25-30% by echo, 01/2013: 15-20% by cath. c. s/p St. Jude ICD implantation 2007.  [name] Gout   . Hypokalemia   . HTN (hypertension)   . Lipoma   . Dyslipidemia   . CAD (coronary artery disease)     a. Initial nonobst by cath 2009. b. Cath 01/2013 in setting of VT storm: obstructive distal Cx disease (small and terminates in the AV groove, unlikely to cause significant ischemia or electrical instability), nonobstructive RCA disease, EF 15-20%.   . Atrial fibrillation     a. Noted 05/2008 by EKG.  . Paroxysmal VT     a. s/p St. Jude ICD 2007. b. H/o paroxysmal VT/VF including VT storm 12/2012 admission prompting amio initiation.  . Nonischemic cardiomyopathy   . ICD (implantable cardiac defibrillator) in place   . OSA (obstructive sleep apnea)     does not wear cpap  . DM (diabetes mellitus)     insulin dependent  . Cerebrovascular accident     a. Basilar CVA  2000. denies deficits  . Pulmonary HTN     a. Mild by cath 01/2013.   Past Surgical History  Procedure Laterality Date  . Cardiac catheterization      Nonobstructive coronary disease 2009  . Cardiac defibrillator placement      ICD-St. Jude  . Lipoma excision      reports that he quit smoking about 27 years ago. He has never used smokeless tobacco. He reports that he drinks alcohol. He reports that he does not use illicit drugs. family history includes Coronary artery disease in his mother. No Known Allergies Current Outpatient Prescriptions on File Prior to Visit  Medication Sig Dispense Refill  . allopurinol (ZYLOPRIM) 300 MG tablet Take 1 tablet (300 mg total) by mouth daily. 90 tablet 3  . amiodarone (PACERONE) 200 MG tablet Take 1 tablet (200 mg total) by mouth daily. 90 tablet 3  . amLODipine (NORVASC) 10 MG tablet Take 10 mg by mouth daily.    [name] atorvastatin (LIPITOR) 40 MG tablet Take 1 tablet (40 mg total) by mouth daily. 90 tablet 3  . carvedilol (COREG) 25 MG tablet Take 2 tablets (50 mg total) by mouth 2 (two) times daily with a meal. 360 tablet 3  . furosemide (LASIX) 80 MG tablet Take 1 tablet (80 mg total) by mouth daily. 90 tablet 3  . glucose blood (ONE TOUCH TEST STRIPS) test strip 1 each by Other route  See admin instructions. Check blood sugar once daily.    . hydrALAZINE (APRESOLINE) 50 MG tablet Take 1 tablet (50 mg total) by mouth every 8 (eight) hours. 270 tablet 3  . Insulin Glargine (LANTUS SOLOSTAR) 100 UNIT/ML Solostar Pen Inject 10 Units into the skin at bedtime. 30 mL 11  . isosorbide dinitrate (ISORDIL) 20 MG tablet Take 1 tablet (20 mg total) by mouth 3 (three) times daily. 270 tablet 3  . metFORMIN (GLUCOPHAGE) 500 MG tablet Take 1 tablet (500 mg total) by mouth 2 (two) times daily. 180 tablet 3  . Multiple Vitamin (MULTIVITAMIN) capsule Take 1 capsule by mouth daily.     . nitroGLYCERIN (NITROSTAT) 0.4 MG SL tablet Place 1 tablet (0.4 mg total) under the  tongue every 5 (five) minutes x 3 doses as needed for chest pain. 25 tablet 2  . ONE TOUCH LANCETS MISC 1 each by Other route See admin instructions. Check blood sugar once daily.    . potassium chloride SA (K-DUR,KLOR-CON) 20 MEQ tablet Take 2 tablets (40 mEq total) by mouth daily. 180 tablet 3  . ramipril (ALTACE) 10 MG capsule Take 1 capsule (10 mg total) by mouth daily. 90 capsule 1  . spironolactone (ALDACTONE) 25 MG tablet Take 1 tablet (25 mg total) by mouth daily. 90 tablet 3  . tiZANidine (ZANAFLEX) 4 MG tablet Take 4 mg by mouth every 6 (six) hours as needed for muscle spasms.     [name] warfarin (COUMADIN) 5 MG tablet Take 5 mg by mouth daily. Take 7.5mg  by mouth Monday and Thursday. Take 10mg  by mouth Tuesday, Wednesday, Friday, Saturday and Sunday.     No current facility-administered medications on file prior to visit.   Review of Systems Constitutional: Negative for increased diaphoresis, other activity, appetite or other siginficant weight change  HENT: Negative for worsening hearing loss, ear pain, facial swelling, mouth sores and neck stiffness.   Eyes: Negative for other worsening pain, redness or visual disturbance.  Respiratory: Negative for shortness of breath and wheezing.   Cardiovascular: Negative for chest pain and palpitations.  Gastrointestinal: Negative for diarrhea, blood in stool, abdominal distention or other pain Genitourinary: Negative for hematuria, flank pain or change in urine volume.  Musculoskeletal: Negative for myalgias or other joint complaints.  Skin: Negative for color change and wound.  Neurological: Negative for syncope and numbness. other than noted Hematological: Negative for adenopathy. or other swelling Psychiatric/Behavioral: Negative for hallucinations, self-injury, decreased concentration or other worsening agitation.      Objective:   Physical Exam BP 120/82 mmHg  Pulse 69  Temp(Src) 98 F (36.7 C) (Oral)  Ht 6' (1.829 m)  Wt 266 lb 8  oz (120.884 kg)  BMI 36.14 kg/m2  SpO2 90% VS noted,  Constitutional: Pt is oriented to person, place, and time. Appears well-developed and well-nourished./obese  Head: Normocephalic and atraumatic.  Right Ear: External ear normal.  Left Ear: External ear normal.  Nose: Nose normal.  Mouth/Throat: Oropharynx is clear and moist.  Eyes: Conjunctivae and EOM are normal. Pupils are equal, round, and reactive to light.  Neck: Normal range of motion. Neck supple. No JVD present. No tracheal deviation present.  Cardiovascular: Normal rate, regular rhythm, normal heart sounds and intact distal pulses.   Pulmonary/Chest: Effort normal and breath sounds without rales or wheezing  Abdominal: Soft. Bowel sounds are normal. NT. No HSM  Musculoskeletal: Normal range of motion. Exhibits no edema.  Lymphadenopathy:  Has no cervical adenopathy.  Neurological: Pt is alert  and oriented to person, place, and time. Pt has normal reflexes. No cranial nerve deficit. Motor grossly intact Skin: Skin is warm and dry. No rash noted.  Psychiatric:  Has normal mood and affect. Behavior is normal.      Assessment & Plan:

## 2014-03-26 ENCOUNTER — Other Ambulatory Visit: Payer: Self-pay | Admitting: Internal Medicine

## 2014-03-26 MED ORDER — METFORMIN HCL 500 MG PO TABS: 500.0000 mg | ORAL_TABLET | Freq: Every day | ORAL | Status: DC

## 2014-03-27 ENCOUNTER — Other Ambulatory Visit: Payer: Self-pay

## 2014-04-11 ENCOUNTER — Telehealth: Payer: Self-pay | Admitting: Family

## 2014-04-11 ENCOUNTER — Ambulatory Visit (INDEPENDENT_AMBULATORY_CARE_PROVIDER_SITE_OTHER): Payer: Medicare Other

## 2014-04-11 DIAGNOSIS — I4891 Unspecified atrial fibrillation: Secondary | ICD-10-CM

## 2014-04-11 DIAGNOSIS — Z5181 Encounter for therapeutic drug level monitoring: Secondary | ICD-10-CM

## 2014-04-11 LAB — POCT INR: INR: 3.1

## 2014-04-11 NOTE — Telephone Encounter (Signed)
Agree with plan 

## 2014-04-12 ENCOUNTER — Encounter (HOSPITAL_COMMUNITY): Payer: Self-pay | Admitting: Cardiology

## 2014-04-18 ENCOUNTER — Other Ambulatory Visit: Payer: Self-pay | Admitting: Cardiology

## 2014-04-20 ENCOUNTER — Encounter: Payer: Self-pay | Admitting: Cardiology

## 2014-04-20 ENCOUNTER — Ambulatory Visit (INDEPENDENT_AMBULATORY_CARE_PROVIDER_SITE_OTHER): Payer: Medicare Other | Admitting: Cardiology

## 2014-04-20 VITALS — BP 100/60 | HR 60 | Ht 72.0 in | Wt 267.0 lb

## 2014-04-20 DIAGNOSIS — I420 Dilated cardiomyopathy: Secondary | ICD-10-CM

## 2014-04-20 NOTE — Patient Instructions (Signed)
Your physician recommends that you schedule a follow-up appointment in:  One year with Dr. Hochrein  

## 2014-04-20 NOTE — Progress Notes (Signed)
HPI The patient presents for follow up of CHF.  The patient denies any new symptoms such as chest discomfort, neck or arm discomfort.  He is still exercising at the [facility].  Since I last saw him he has been doing very well. He did have his ICD changed and he's had some nagging annoying discomfort at the pocket.  He has a mild cough that is not new it is not particularly productive. He's not having however any new shortness of breath, PND or orthopnea. He's had no weight gain or edema. He feels well.  No Known Allergies  Current Outpatient Prescriptions  Medication Sig Dispense Refill  . allopurinol (ZYLOPRIM) 300 MG tablet Take 1 tablet (300 mg total) by mouth daily. 90 tablet 3  . amiodarone (PACERONE) 200 MG tablet Take 1 tablet (200 mg total) by mouth daily. 90 tablet 3  . amLODipine (NORVASC) 10 MG tablet Take 10 mg by mouth daily.    [name] atorvastatin (LIPITOR) 40 MG tablet Take 1 tablet (40 mg total) by mouth daily. 90 tablet 3  . carvedilol (COREG) 25 MG tablet Take 2 tablets (50 mg total) by mouth 2 (two) times daily with a meal. 360 tablet 3  . furosemide (LASIX) 80 MG tablet Take 1 tablet (80 mg total) by mouth daily. 90 tablet 3  . glucose blood (ONE TOUCH TEST STRIPS) test strip 1 each by Other route See admin instructions. Check blood sugar once daily.    . hydrALAZINE (APRESOLINE) 50 MG tablet Take 1 tablet (50 mg total) by mouth every 8 (eight) hours. 270 tablet 3  . Insulin Glargine (LANTUS SOLOSTAR) 100 UNIT/ML Solostar Pen Inject 10 Units into the skin at bedtime. 30 mL 11  . Insulin Pen Needle (B-D ULTRAFINE III SHORT PEN) 31G X 8 MM MISC Use once daily with insulin.  Diagnosis 100 each 3  . isosorbide dinitrate (ISORDIL) 20 MG tablet Take 1 tablet (20 mg total) by mouth 3 (three) times daily. 270 tablet 3  . KLOR-CON M20 20 MEQ tablet TAKE 2 TABLETS (40 MEQ TOTAL) BY MOUTH DAILY. 180 tablet 3  . metFORMIN (GLUCOPHAGE) 500 MG tablet Take 1 tablet (500 mg total) by mouth  daily with breakfast. 90 tablet 3  . Multiple Vitamin (MULTIVITAMIN) capsule Take 1 capsule by mouth daily.     . nitroGLYCERIN (NITROSTAT) 0.4 MG SL tablet Place 1 tablet (0.4 mg total) under the tongue every 5 (five) minutes x 3 doses as needed for chest pain. 25 tablet 2  . ONE TOUCH LANCETS MISC 1 each by Other route See admin instructions. Check blood sugar once daily.    . ramipril (ALTACE) 10 MG capsule Take 1 capsule (10 mg total) by mouth daily. 90 capsule 1  . spironolactone (ALDACTONE) 25 MG tablet Take 1 tablet (25 mg total) by mouth daily. 90 tablet 3  . tiZANidine (ZANAFLEX) 4 MG tablet Take 4 mg by mouth every 6 (six) hours as needed for muscle spasms.     [name] warfarin (COUMADIN) 5 MG tablet Take 5 mg by mouth daily. Take 7.5mg  by mouth Monday and Thursday. Take 10mg  by mouth Tuesday, Wednesday, Friday, Saturday and Sunday.     No current facility-administered medications for this visit.    Past Medical History  Diagnosis Date  . Chronic systolic CHF (congestive heart failure)     a. Likely NICM (out of proportion to CAD). b. 2009 - EF 25-30% by echo, 01/2013: 15-20% by cath. c. s/p St. Jude  ICD implantation 2007.  [name] Gout   . Hypokalemia   . HTN (hypertension)   . Lipoma   . Dyslipidemia   . CAD (coronary artery disease)     a. Initial nonobst by cath 2009. b. Cath 01/2013 in setting of VT storm: obstructive distal Cx disease (small and terminates in the AV groove, unlikely to cause significant ischemia or electrical instability), nonobstructive RCA disease, EF 15-20%.   . Atrial fibrillation     a. Noted 05/2008 by EKG.  . Paroxysmal VT     a. s/p St. Jude ICD 2007. b. H/o paroxysmal VT/VF including VT storm 12/2012 admission prompting amio initiation.  . Nonischemic cardiomyopathy   . ICD (implantable cardiac defibrillator) in place   . OSA (obstructive sleep apnea)     does not wear cpap  . DM (diabetes mellitus)     insulin dependent  . Cerebrovascular accident     a.  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 Pulmonary HTN     a. Mild by cath 01/2013.    Past Surgical History  Procedure Laterality Date  . Cardiac catheterization      Nonobstructive coronary disease 2009  . Cardiac defibrillator placement      ICD-St. Jude  . Lipoma excision    . Left and right heart catheterization with coronary angiogram N/A 01/03/2013    Procedure: LEFT AND RIGHT HEART CATHETERIZATION WITH CORONARY ANGIOGRAM;  Surgeon: Peter M Martinique, MD;  Location: [facility] CATH LAB;  Service: Cardiovascular;  Laterality: N/A;  . Implantable cardioverter defibrillator (icd) generator change N/A 01/15/2014    Procedure: ICD GENERATOR CHANGE;  Surgeon: Evans Lance, MD;  Location: [facility] CATH LAB;  Service: Cardiovascular;  Laterality: N/A;   ROS:  As stated in the HPI and negative for all other systems.  PHYSICAL EXAM There were no vitals taken for this visit. GENERAL:  Well appearing NECK:  No jugular venous distention, waveform within normal limits, carotid upstroke brisk and symmetric, no bruits, no thyromegaly LUNGS:  Clear to auscultation bilaterally BACK:  No CVA tenderness CHEST:  Unremarkable, well-healed ICD pocket HEART:  PMI not displaced or sustained,S1 and S2 within normal limits, no S3, no S4, no clicks, no rubs, no murmurs ABD:  Flat, positive bowel sounds normal in frequency in pitch, no bruits, no rebound, no guarding, no midline pulsatile mass, no hepatomegaly, no splenomegaly EXT:  2 plus pulses throughout, no edema, no cyanosis no clubbing   ASSESSMENT AND PLAN  Congestive heart failure, unspecified -  He has had no new symptoms since his last echo done in 2013. He seems to be euvolemic. He'll continue the meds as listed.  He is up-to-date with labs on spironolactone.  HYPERTENSION -  His blood pressure is well controlled.  He will continue the medications as ordered.  Atrial fibrillation-  He has had no symptomatic paroxysms of this. He's not interested in changing  from warfarin to another agent.  He is up-to-date with his labs for his amiodarone. No change in therapy is indicated.

## 2014-05-01 ENCOUNTER — Other Ambulatory Visit: Payer: Self-pay | Admitting: Cardiology

## 2014-05-02 ENCOUNTER — Ambulatory Visit (INDEPENDENT_AMBULATORY_CARE_PROVIDER_SITE_OTHER): Payer: Medicare Other | Admitting: Internal Medicine

## 2014-05-02 ENCOUNTER — Encounter: Payer: Self-pay | Admitting: Internal Medicine

## 2014-05-02 VITALS — BP 90/72 | HR 73 | Ht 72.0 in | Wt 268.0 lb

## 2014-05-02 DIAGNOSIS — I472 Ventricular tachycardia, unspecified: Secondary | ICD-10-CM

## 2014-05-02 DIAGNOSIS — I429 Cardiomyopathy, unspecified: Secondary | ICD-10-CM

## 2014-05-02 DIAGNOSIS — I5023 Acute on chronic systolic (congestive) heart failure: Secondary | ICD-10-CM

## 2014-05-02 DIAGNOSIS — Z95810 Presence of automatic (implantable) cardiac defibrillator: Secondary | ICD-10-CM

## 2014-05-02 DIAGNOSIS — I428 Other cardiomyopathies: Secondary | ICD-10-CM

## 2014-05-02 LAB — MDC_IDC_ENUM_SESS_TYPE_INCLINIC
Battery Remaining Longevity: 99.6 mo
Brady Statistic RV Percent Paced: 0 %
HighPow Impedance: 43.9565
Implantable Pulse Generator Serial Number: 7214340
Lead Channel Impedance Value: 387.5 Ohm
Lead Channel Pacing Threshold Amplitude: 1.25 V
Lead Channel Pacing Threshold Pulse Width: 0.7 ms
Lead Channel Setting Pacing Amplitude: 2.5 V
Lead Channel Setting Pacing Pulse Width: 0.7 ms
Lead Channel Setting Sensing Sensitivity: 0.5 mV
MDC IDC MSMT LEADCHNL RV SENSING INTR AMPL: 12 mV
MDC IDC SESS DTM: 20151230144416
MDC IDC SET ZONE DETECTION INTERVAL: 320 ms
Zone Setting Detection Interval: 250 ms
Zone Setting Detection Interval: 280 ms

## 2014-05-02 NOTE — Assessment & Plan Note (Signed)
His chronic systolic heart failure is well compensated. He will continue his current medications and maintain a low-sodium diet.

## 2014-05-02 NOTE — Assessment & Plan Note (Signed)
He has had recurrent ventricular tachycardia, but he is currently well-controlled. He'll continue his current medical therapy.

## 2014-05-02 NOTE — Patient Instructions (Addendum)
Your physician wants you to follow-up in: 12 months with Dr. Taylor You will receive a reminder letter in the mail two months in advance. If you don't receive a letter, please call our office to schedule the follow-up appointment.    Remote monitoring is used to monitor your Pacemaker or ICD from home. This monitoring reduces the number of office visits required to check your device to one time per year. It allows us to keep an eye on the functioning of your device to ensure it is working properly. You are scheduled for a device check from home on 08/01/14. You may send your transmission at any time that day. If you have a wireless device, the transmission will be sent automatically. After your physician reviews your transmission, you will receive a postcard with your next transmission date.   

## 2014-05-02 NOTE — Progress Notes (Signed)
HPI Mr. Schalow returns today for followup. He is a pleasant 58 yo man with a h/o non-ischemic CM, chronic systolic heart failure and VT. He received an appropriate ICD shock over a year ago. The patient denies chest pain or worsening shortness of breath or peripheral edema.  He does admit to some noncompliance with his diet. He has undergone insertion of a new ICD, as his prior device had reached elective replacement. He has had no recurrent hospitalizations. He has very mild peripheral edema. He is trying to maintain a low-sodium diet. No Known Allergies   Current Outpatient Prescriptions  Medication Sig Dispense Refill  . allopurinol (ZYLOPRIM) 300 MG tablet Take 1 tablet (300 mg total) by mouth daily. 90 tablet 3  . amiodarone (PACERONE) 200 MG tablet Take 1 tablet (200 mg total) by mouth daily. 90 tablet 3  . amLODipine (NORVASC) 10 MG tablet Take 10 mg by mouth daily.    [name] atorvastatin (LIPITOR) 40 MG tablet Take 1 tablet (40 mg total) by mouth daily. 90 tablet 3  . carvedilol (COREG) 25 MG tablet Take 2 tablets (50 mg total) by mouth 2 (two) times daily with a meal. 360 tablet 3  . furosemide (LASIX) 80 MG tablet Take 1 tablet (80 mg total) by mouth daily. 90 tablet 3  . glucose blood (ONE TOUCH TEST STRIPS) test strip 1 each by Other route See admin instructions. Check blood sugar once daily.    . hydrALAZINE (APRESOLINE) 50 MG tablet Take 1 tablet (50 mg total) by mouth every 8 (eight) hours. 270 tablet 3  . Insulin Glargine (LANTUS SOLOSTAR) 100 UNIT/ML Solostar Pen Inject 10 Units into the skin at bedtime. 30 mL 11  . Insulin Pen Needle (B-D ULTRAFINE III SHORT PEN) 31G X 8 MM MISC Use once daily with insulin.  Diagnosis 100 each 3  . isosorbide dinitrate (ISORDIL) 20 MG tablet Take 1 tablet (20 mg total) by mouth 3 (three) times daily. 270 tablet 3  . KLOR-CON M20 20 MEQ tablet TAKE 2 TABLETS (40 MEQ TOTAL) BY MOUTH DAILY. 180 tablet 3  . metFORMIN (GLUCOPHAGE) 500 MG tablet Take 1 tablet  (500 mg total) by mouth daily with breakfast. 90 tablet 3  . Multiple Vitamin (MULTIVITAMIN) capsule Take 1 capsule by mouth daily.     . nitroGLYCERIN (NITROSTAT) 0.4 MG SL tablet Place 1 tablet (0.4 mg total) under the tongue every 5 (five) minutes x 3 doses as needed for chest pain. 25 tablet 2  . ONE TOUCH LANCETS MISC 1 each by Other route See admin instructions. Check blood sugar once daily.    . ramipril (ALTACE) 10 MG capsule Take 1 capsule (10 mg total) by mouth daily. 90 capsule 1  . tiZANidine (ZANAFLEX) 4 MG tablet Take 4 mg by mouth every 6 (six) hours as needed for muscle spasms.     [name] warfarin (COUMADIN) 5 MG tablet Take 5 mg by mouth daily. Take 7.5mg  by mouth Monday and Thursday. Take 10mg  by mouth Tuesday, Wednesday, Friday, Saturday and Sunday.    [name] spironolactone (ALDACTONE) 25 MG tablet TAKE 1 TABLET (25 MG TOTAL) BY MOUTH DAILY. 90 tablet 3   No current facility-administered medications for this visit.     Past Medical History  Diagnosis Date  . Chronic systolic CHF (congestive heart failure)     a. Likely NICM (out of proportion to CAD). b. 2009 - EF 25-30% by echo, 01/2013: 15-20% by cath. c. s/p St. Jude ICD implantation 2007.  [name]  Gout   . Hypokalemia   . HTN (hypertension)   . Lipoma   . Dyslipidemia   . CAD (coronary artery disease)     a. Initial nonobst by cath 2009. b. Cath 01/2013 in setting of VT storm: obstructive distal Cx disease (small and terminates in the AV groove, unlikely to cause significant ischemia or electrical instability), nonobstructive RCA disease, EF 15-20%.   . Atrial fibrillation     a. Noted 05/2008 by EKG.  . Paroxysmal VT     a. s/p St. Jude ICD 2007. b. H/o paroxysmal VT/VF including VT storm 12/2012 admission prompting amio initiation.  . Nonischemic cardiomyopathy   . ICD (implantable cardiac defibrillator) in place   . OSA (obstructive sleep apnea)     does not wear cpap  . DM (diabetes mellitus)     insulin dependent  .  Cerebrovascular accident     a. 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 Pulmonary HTN     a. Mild by cath 01/2013.    ROS:   All systems reviewed and negative except as noted in the HPI.   Past Surgical History  Procedure Laterality Date  . Cardiac catheterization      Nonobstructive coronary disease 2009  . Cardiac defibrillator placement      ICD-St. Jude  . Lipoma excision    . Left and right heart catheterization with coronary angiogram N/A 01/03/2013    Procedure: LEFT AND RIGHT HEART CATHETERIZATION WITH CORONARY ANGIOGRAM;  Surgeon: Peter M Martinique, MD;  Location: [facility] CATH LAB;  Service: Cardiovascular;  Laterality: N/A;  . Implantable cardioverter defibrillator (icd) generator change N/A 01/15/2014    Procedure: ICD GENERATOR CHANGE;  Surgeon: Evans Lance, MD;  Location: [facility] CATH LAB;  Service: Cardiovascular;  Laterality: N/A;     Family History  Problem Relation Age of Onset  . Coronary artery disease Mother      History   Social History  . Marital Status: Married    Spouse Name: N/A    Number of Children: 2  . Years of Education: N/A   Occupational History  . custodian    Social History Main Topics  . Smoking status: Former Smoker    Quit date: 05/04/1986  . Smokeless tobacco: Never Used     Comment: Quit smoking 30 yrs ago. Smoked as teenager less than 1/2 ppd. Smoked x 4 years.  . Alcohol Use: Yes     Comment: occasionally  . Drug Use: No  . Sexual Activity: No   Other Topics Concern  . Not on file   Social History Narrative     BP 90/72 mmHg  Pulse 73  Ht 6' (1.829 m)  Wt 268 lb (121.564 kg)  BMI 36.34 kg/m2  Physical Exam:  Well appearing middle-aged man, NAD HEENT: Unremarkable Neck:  6 cm JVD, no thyromegall Back:  No CVA tenderness Lungs:  Clear with no wheezes, rales, or rhonchi. HEART:  IRegular rate rhythm, no murmurs, no rubs, no clicks Abd:  soft, positive bowel sounds, no organomegally, no rebound, no guarding Ext:  2 plus  pulses, no edema, no cyanosis, no clubbing Skin:  No rashes no nodules Neuro:  CN II through XII intact, motor grossly intact   DEVICE  Normal device function.  See PaceArt for details.   Assess/Plan:

## 2014-05-02 NOTE — Assessment & Plan Note (Signed)
His St. Jude single-chamber ICD is working normally. We'll plan to recheck in several months. 

## 2014-05-09 ENCOUNTER — Ambulatory Visit (INDEPENDENT_AMBULATORY_CARE_PROVIDER_SITE_OTHER): Payer: Medicare Other | Admitting: Family Medicine

## 2014-05-09 DIAGNOSIS — Z5181 Encounter for therapeutic drug level monitoring: Secondary | ICD-10-CM

## 2014-05-09 DIAGNOSIS — I4891 Unspecified atrial fibrillation: Secondary | ICD-10-CM | POA: Diagnosis not present

## 2014-05-09 LAB — POCT INR: INR: 2.1

## 2014-05-14 ENCOUNTER — Other Ambulatory Visit: Payer: Self-pay | Admitting: Internal Medicine

## 2014-05-18 ENCOUNTER — Other Ambulatory Visit: Payer: Self-pay | Admitting: Cardiology

## 2014-05-19 ENCOUNTER — Other Ambulatory Visit: Payer: Self-pay | Admitting: Cardiology

## 2014-05-22 ENCOUNTER — Other Ambulatory Visit: Payer: Self-pay | Admitting: Cardiology

## 2014-05-23 ENCOUNTER — Other Ambulatory Visit: Payer: Self-pay | Admitting: Cardiology

## 2014-06-06 ENCOUNTER — Ambulatory Visit (INDEPENDENT_AMBULATORY_CARE_PROVIDER_SITE_OTHER): Payer: Medicare Other | Admitting: General Practice

## 2014-06-06 ENCOUNTER — Telehealth: Payer: Self-pay | Admitting: Family

## 2014-06-06 DIAGNOSIS — Z5181 Encounter for therapeutic drug level monitoring: Secondary | ICD-10-CM | POA: Diagnosis not present

## 2014-06-06 LAB — POCT INR: INR: 3

## 2014-06-06 NOTE — Progress Notes (Signed)
Pre visit review using our clinic review tool, if applicable. No additional management support is needed unless otherwise documented below in the visit note. 

## 2014-06-06 NOTE — Telephone Encounter (Signed)
Agree with plan 

## 2014-06-21 ENCOUNTER — Other Ambulatory Visit: Payer: Self-pay | Admitting: Cardiology

## 2014-07-04 ENCOUNTER — Ambulatory Visit (INDEPENDENT_AMBULATORY_CARE_PROVIDER_SITE_OTHER): Payer: Medicare Other | Admitting: General Practice

## 2014-07-04 DIAGNOSIS — Z5181 Encounter for therapeutic drug level monitoring: Secondary | ICD-10-CM

## 2014-07-04 LAB — POCT INR: INR: 2.7

## 2014-07-04 NOTE — Progress Notes (Signed)
Pre visit review using our clinic review tool, if applicable. No additional management support is needed unless otherwise documented below in the visit note. 

## 2014-07-04 NOTE — Progress Notes (Signed)
Agree with plan 

## 2014-07-05 ENCOUNTER — Emergency Department (HOSPITAL_COMMUNITY)
Admission: EM | Admit: 2014-07-05 | Discharge: 2014-07-05 | Disposition: A | Discharge status: Home / Self Care | Payer: Medicare Other | Attending: Emergency Medicine | Admitting: Emergency Medicine

## 2014-07-05 ENCOUNTER — Encounter (HOSPITAL_COMMUNITY): Payer: Self-pay | Admitting: Emergency Medicine

## 2014-07-05 ENCOUNTER — Emergency Department (HOSPITAL_COMMUNITY): Payer: Medicare Other

## 2014-07-05 DIAGNOSIS — Z7901 Long term (current) use of anticoagulants: Secondary | ICD-10-CM | POA: Diagnosis not present

## 2014-07-05 DIAGNOSIS — Z79899 Other long term (current) drug therapy: Secondary | ICD-10-CM | POA: Insufficient documentation

## 2014-07-05 DIAGNOSIS — Z8673 Personal history of transient ischemic attack (TIA), and cerebral infarction without residual deficits: Secondary | ICD-10-CM | POA: Diagnosis not present

## 2014-07-05 DIAGNOSIS — Z9889 Other specified postprocedural states: Secondary | ICD-10-CM | POA: Insufficient documentation

## 2014-07-05 DIAGNOSIS — E119 Type 2 diabetes mellitus without complications: Secondary | ICD-10-CM | POA: Diagnosis not present

## 2014-07-05 DIAGNOSIS — Z86018 Personal history of other benign neoplasm: Secondary | ICD-10-CM | POA: Diagnosis not present

## 2014-07-05 DIAGNOSIS — M7641 Tibial collateral bursitis [Pellegrini-Stieda], right leg: Secondary | ICD-10-CM | POA: Diagnosis not present

## 2014-07-05 DIAGNOSIS — M25461 Effusion, right knee: Secondary | ICD-10-CM | POA: Insufficient documentation

## 2014-07-05 DIAGNOSIS — Z87891 Personal history of nicotine dependence: Secondary | ICD-10-CM | POA: Insufficient documentation

## 2014-07-05 DIAGNOSIS — I2510 Atherosclerotic heart disease of native coronary artery without angina pectoris: Secondary | ICD-10-CM | POA: Insufficient documentation

## 2014-07-05 DIAGNOSIS — E785 Hyperlipidemia, unspecified: Secondary | ICD-10-CM | POA: Diagnosis not present

## 2014-07-05 DIAGNOSIS — M109 Gout, unspecified: Secondary | ICD-10-CM | POA: Insufficient documentation

## 2014-07-05 DIAGNOSIS — I4891 Unspecified atrial fibrillation: Secondary | ICD-10-CM | POA: Insufficient documentation

## 2014-07-05 DIAGNOSIS — Z95810 Presence of automatic (implantable) cardiac defibrillator: Secondary | ICD-10-CM | POA: Diagnosis not present

## 2014-07-05 DIAGNOSIS — I10 Essential (primary) hypertension: Secondary | ICD-10-CM | POA: Insufficient documentation

## 2014-07-05 DIAGNOSIS — Z8669 Personal history of other diseases of the nervous system and sense organs: Secondary | ICD-10-CM | POA: Insufficient documentation

## 2014-07-05 DIAGNOSIS — I5022 Chronic systolic (congestive) heart failure: Secondary | ICD-10-CM | POA: Insufficient documentation

## 2014-07-05 DIAGNOSIS — Z794 Long term (current) use of insulin: Secondary | ICD-10-CM | POA: Diagnosis not present

## 2014-07-05 DIAGNOSIS — M25561 Pain in right knee: Secondary | ICD-10-CM | POA: Diagnosis present

## 2014-07-05 MED ORDER — OXYCODONE-ACETAMINOPHEN 5-325 MG PO TABS: 1.0000 | ORAL_TABLET | Freq: Three times a day (TID) | ORAL | Status: DC | PRN

## 2014-07-05 MED ORDER — OXYCODONE-ACETAMINOPHEN 5-325 MG PO TABS
1.0000 | ORAL_TABLET | Freq: Once | ORAL | Status: AC
Start: 2014-07-05 — End: 2014-07-05
  Administered 2014-07-05: 1 via ORAL
  Filled 2014-07-05: qty 1

## 2014-07-05 NOTE — ED Provider Notes (Signed)
CSN: [id number]     Arrival date & time 07/05/14  0906 History   First MD Initiated Contact with Patient 07/05/14 [phone]     Chief Complaint  Patient presents with  . Knee Pain     (Consider location/radiation/quality/duration/timing/severity/associated sxs/prior Treatment) HPI Comments: Pt states that he has a history of gout but never in his knee so he is not sure if this is similar.   Patient is a 59 y.o. male presenting with knee pain. The history is provided by the patient. No language interpreter was used.  Knee Pain Location:  Knee Time since incident:  4 days Injury: no   Knee location:  R knee Pain details:    Quality:  Aching   Radiates to:  Does not radiate   Severity:  Moderate   Timing:  Constant   Progression:  Unchanged   Past Medical History  Diagnosis Date  . Chronic systolic CHF (congestive heart failure)     a. Likely NICM (out of proportion to CAD). b. 2009 - EF 25-30% by echo, 01/2013: 15-20% by cath. c. s/p St. Jude ICD implantation 2007.  [name] Gout   . Hypokalemia   . HTN (hypertension)   . Lipoma   . Dyslipidemia   . CAD (coronary artery disease)     a. Initial nonobst by cath 2009. b. Cath 01/2013 in setting of VT storm: obstructive distal Cx disease (small and terminates in the AV groove, unlikely to cause significant ischemia or electrical instability), nonobstructive RCA disease, EF 15-20%.   . Atrial fibrillation     a. Noted 05/2008 by EKG.  . Paroxysmal VT     a. s/p St. Jude ICD 2007. b. H/o paroxysmal VT/VF including VT storm 12/2012 admission prompting amio initiation.  . Nonischemic cardiomyopathy   . ICD (implantable cardiac defibrillator) in place   . OSA (obstructive sleep apnea)     does not wear cpap  . DM (diabetes mellitus)     insulin dependent  . Cerebrovascular accident     a. 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 Pulmonary HTN     a. Mild by cath 01/2013.   Past Surgical History  Procedure Laterality Date  . Cardiac  catheterization      Nonobstructive coronary disease 2009  . Cardiac defibrillator placement      ICD-St. Jude  . Lipoma excision    . Left and right heart catheterization with coronary angiogram N/A 01/03/2013    Procedure: LEFT AND RIGHT HEART CATHETERIZATION WITH CORONARY ANGIOGRAM;  Surgeon: Peter M Martinique, MD;  Location: [facility] CATH LAB;  Service: Cardiovascular;  Laterality: N/A;  . Implantable cardioverter defibrillator (icd) generator change N/A 01/15/2014    Procedure: ICD GENERATOR CHANGE;  Surgeon: Evans Lance, MD;  Location: [facility] CATH LAB;  Service: Cardiovascular;  Laterality: N/A;   Family History  Problem Relation Age of Onset  . Coronary artery disease Mother    History  Substance Use Topics  . Smoking status: Former Smoker    Quit date: 05/04/1986  . Smokeless tobacco: Never Used     Comment: Quit smoking 30 yrs ago. Smoked as teenager less than 1/2 ppd. Smoked x 4 years.  . Alcohol Use: Yes     Comment: occasionally    Review of Systems  All other systems reviewed and are negative.     Allergies  Review of patient's allergies indicates no known allergies.  Home Medications   Prior to Admission medications   Medication Sig Start  Date End Date Taking? Authorizing Provider  allopurinol (ZYLOPRIM) 300 MG tablet Take 1 tablet (300 mg total) by mouth daily. 09/19/13   Biagio Borg, MD  amiodarone (PACERONE) 200 MG tablet TAKE 1 TABLET (200 MG TOTAL) BY MOUTH DAILY. 05/21/14   Minus Breeding, MD  amLODipine (NORVASC) 10 MG tablet Take 10 mg by mouth daily.    Historical Provider, MD  amLODipine (NORVASC) 10 MG tablet TAKE 1 TABLET BY MOUTH DAILY 05/18/14   Minus Breeding, MD  atorvastatin (LIPITOR) 40 MG tablet TAKE 1 TABLET (40 MG TOTAL) BY MOUTH DAILY. 05/18/14   Minus Breeding, MD  carvedilol (COREG) 25 MG tablet TAKE 2 TABLETS (50 MG TOTAL) BY MOUTH 2 (TWO) TIMES DAILY WITH A MEAL. 05/24/14   Minus Breeding, MD  furosemide (LASIX) 80 MG tablet TAKE 1 TABLET (80 MG  TOTAL) BY MOUTH DAILY. 05/21/14   Minus Breeding, MD  glucose blood (ONE TOUCH TEST STRIPS) test strip 1 each by Other route See admin instructions. Check blood sugar once daily.    Historical Provider, MD  hydrALAZINE (APRESOLINE) 50 MG tablet TAKE 1 TABLET (50 MG TOTAL) BY MOUTH EVERY 8 (EIGHT) HOURS. 06/22/14   Minus Breeding, MD  Insulin Glargine (LANTUS SOLOSTAR) 100 UNIT/ML Solostar Pen Inject 10 Units into the skin at bedtime. 09/19/13   Biagio Borg, MD  Insulin Pen Needle (B-D ULTRAFINE III SHORT PEN) 31G X 8 MM MISC Use once daily with insulin.  Diagnosis 03/23/14   Biagio Borg, MD  isosorbide dinitrate (ISORDIL) 20 MG tablet TAKE 1 TABLET (20 MG TOTAL) BY MOUTH 3 (THREE) TIMES DAILY. 06/22/14   Minus Breeding, MD  KLOR-CON M20 20 MEQ tablet TAKE 2 TABLETS (40 MEQ TOTAL) BY MOUTH DAILY. 04/18/14   Minus Breeding, MD  metFORMIN (GLUCOPHAGE) 500 MG tablet Take 1 tablet (500 mg total) by mouth daily with breakfast. 03/26/14   Biagio Borg, MD  Multiple Vitamin (MULTIVITAMIN) capsule Take 1 capsule by mouth daily.     Historical Provider, MD  nitroGLYCERIN (NITROSTAT) 0.4 MG SL tablet Place 1 tablet (0.4 mg total) under the tongue every 5 (five) minutes x 3 doses as needed for chest pain. 01/12/14   Minus Breeding, MD  ONE TOUCH LANCETS MISC 1 each by Other route See admin instructions. Check blood sugar once daily.    Historical Provider, MD  ramipril (ALTACE) 10 MG capsule TAKE 1 CAPSULE (10 MG TOTAL) BY MOUTH DAILY. 05/21/14   Minus Breeding, MD  spironolactone (ALDACTONE) 25 MG tablet TAKE 1 TABLET (25 MG TOTAL) BY MOUTH DAILY. 05/02/14   Minus Breeding, MD  tiZANidine (ZANAFLEX) 4 MG tablet Take 4 mg by mouth every 6 (six) hours as needed for muscle spasms.     Historical Provider, MD  warfarin (COUMADIN) 5 MG tablet Take 5 mg by mouth daily. Take 7.5mg  by mouth Monday and Thursday. Take 10mg  by mouth Tuesday, Wednesday, Friday, Saturday and Sunday.    Historical Provider, MD  warfarin  (COUMADIN) 5 MG tablet TAKE AS DIRECTED BY ANTICOAGULATION CLINIC 05/14/14   Biagio Borg, MD   BP 151/95 mmHg  Pulse 72  Temp(Src) 97.7 F (36.5 C) (Oral)  Resp 18  SpO2 100% Physical Exam  Constitutional: He is oriented to person, place, and time. He appears well-developed and well-nourished.  Cardiovascular: Normal rate and regular rhythm.   Pulmonary/Chest: Effort normal and breath sounds normal.  Musculoskeletal:  Swelling noted to the right knee. No redness noted. Pt has slowed range of  motion do to pain  Neurological: He is alert and oriented to person, place, and time.  Skin: Skin is warm and dry.  Nursing note and vitals reviewed.   ED Course  Procedures (including critical care time) Labs Review Labs Reviewed - No data to display  Imaging Review Dg Knee Complete 4 Views Right  07/05/2014   CLINICAL DATA:  Five day history of pain.  No recent injury  EXAM: RIGHT KNEE - COMPLETE 4+ VIEW  COMPARISON:  None.  FINDINGS: Frontal, lateral, and bilateral oblique views were obtained. There is no acute fracture or dislocation. There is a moderate joint effusion. There is calcification along the superior aspect of the medial collateral ligament. There is no meniscal calcification. There is patellofemoral joint space narrowing. Other joint spaces appear intact. There is a sclerotic focus along the medial distal femoral diaphysis laterally measuring 3.2 x 1.1 cm, benign appearance. There is no abnormal periosteal reaction. There are scattered foci of arterial vascular calcification.  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 Moderate joint effusion. No acute fracture or dislocation. Benign sclerosis distal femur. There is patellofemoral joint space narrowing. Other joint spaces appear intact. No erosions.   Electronically Signed   By: Lowella Grip III M.D.   On: 07/05/2014 09:38     EKG  Interpretation None      MDM   Final diagnoses:  Knee effusion, right    Pt splinted and given crutches. Considered gout although not consistent with that at this time.pt given ortho follow up    Glendell Docker, NP 07/05/14 [id number]  Maudry Diego, MD 07/05/14 [phone]

## 2014-07-05 NOTE — ED Notes (Addendum)
Patient states R knee pain that started Sunday morning.  Patient denies injury of any kind.  Patient states history of gout.

## 2014-07-05 NOTE — Discharge Instructions (Signed)

## 2014-07-05 NOTE — ED Notes (Signed)
Declined W/C at D/C and was escorted to lobby by RN. 

## 2014-07-13 DIAGNOSIS — M25561 Pain in right knee: Secondary | ICD-10-CM | POA: Diagnosis not present

## 2014-07-26 ENCOUNTER — Telehealth: Payer: Self-pay | Admitting: *Deleted

## 2014-07-26 NOTE — Telephone Encounter (Signed)
Clearance for patient to have right knee scope, loose body removal faxed.

## 2014-08-01 ENCOUNTER — Ambulatory Visit (INDEPENDENT_AMBULATORY_CARE_PROVIDER_SITE_OTHER): Payer: Medicare Other | Admitting: *Deleted

## 2014-08-01 DIAGNOSIS — I428 Other cardiomyopathies: Secondary | ICD-10-CM

## 2014-08-01 DIAGNOSIS — I429 Cardiomyopathy, unspecified: Secondary | ICD-10-CM

## 2014-08-01 NOTE — Progress Notes (Signed)
Remote ICD transmission.   

## 2014-08-02 LAB — MDC_IDC_ENUM_SESS_TYPE_REMOTE
Brady Statistic RV Percent Paced: 1 % — CL
HIGH POWER IMPEDANCE MEASURED VALUE: 47 Ohm
Lead Channel Setting Pacing Pulse Width: 0.7 ms
MDC IDC MSMT LEADCHNL RV IMPEDANCE VALUE: 430 Ohm
MDC IDC MSMT LEADCHNL RV SENSING INTR AMPL: 12 mV — AB
MDC IDC PG SERIAL: 7214340
MDC IDC SET LEADCHNL RV PACING AMPLITUDE: 2.5 V
MDC IDC SET LEADCHNL RV SENSING SENSITIVITY: 0.5 mV
MDC IDC SET ZONE DETECTION INTERVAL: 280 ms
Zone Setting Detection Interval: 250 ms
Zone Setting Detection Interval: 320 ms

## 2014-08-08 ENCOUNTER — Encounter: Payer: Self-pay | Admitting: Cardiology

## 2014-08-13 ENCOUNTER — Other Ambulatory Visit: Payer: Self-pay | Admitting: Internal Medicine

## 2014-08-13 ENCOUNTER — Other Ambulatory Visit: Payer: Self-pay | Admitting: General Practice

## 2014-08-13 MED ORDER — WARFARIN SODIUM 5 MG PO TABS: ORAL_TABLET | ORAL | Status: DC

## 2014-08-14 ENCOUNTER — Other Ambulatory Visit: Payer: Self-pay | Admitting: Cardiology

## 2014-08-15 ENCOUNTER — Ambulatory Visit (INDEPENDENT_AMBULATORY_CARE_PROVIDER_SITE_OTHER): Payer: Medicare Other | Admitting: General Practice

## 2014-08-15 ENCOUNTER — Encounter: Payer: Self-pay | Admitting: Internal Medicine

## 2014-08-15 DIAGNOSIS — Z5181 Encounter for therapeutic drug level monitoring: Secondary | ICD-10-CM | POA: Diagnosis not present

## 2014-08-15 LAB — POCT INR: INR: 4.3

## 2014-08-15 NOTE — Progress Notes (Signed)
Pre visit review using our clinic review tool, if applicable. No additional management support is needed unless otherwise documented below in the visit note. 

## 2014-08-15 NOTE — Progress Notes (Signed)
Agree with plan 

## 2014-08-20 ENCOUNTER — Other Ambulatory Visit: Payer: Self-pay | Admitting: Cardiology

## 2014-08-22 ENCOUNTER — Encounter: Payer: Self-pay | Admitting: Cardiology

## 2014-08-23 ENCOUNTER — Other Ambulatory Visit: Payer: Self-pay | Admitting: Cardiology

## 2014-08-31 ENCOUNTER — Other Ambulatory Visit: Payer: Self-pay | Admitting: Cardiology

## 2014-09-05 ENCOUNTER — Ambulatory Visit (INDEPENDENT_AMBULATORY_CARE_PROVIDER_SITE_OTHER): Payer: Medicare Other | Admitting: General Practice

## 2014-09-05 DIAGNOSIS — Z5181 Encounter for therapeutic drug level monitoring: Secondary | ICD-10-CM | POA: Diagnosis not present

## 2014-09-05 DIAGNOSIS — I4891 Unspecified atrial fibrillation: Secondary | ICD-10-CM | POA: Diagnosis not present

## 2014-09-05 LAB — POCT INR: INR: 3.9

## 2014-09-05 NOTE — Progress Notes (Signed)
Pre visit review using our clinic review tool, if applicable. No additional management support is needed unless otherwise documented below in the visit note. 

## 2014-09-05 NOTE — Progress Notes (Signed)
Agree with plan 

## 2014-09-25 ENCOUNTER — Ambulatory Visit (INDEPENDENT_AMBULATORY_CARE_PROVIDER_SITE_OTHER): Payer: Medicare Other | Admitting: Internal Medicine

## 2014-09-25 ENCOUNTER — Encounter: Payer: Self-pay | Admitting: Internal Medicine

## 2014-09-25 ENCOUNTER — Ambulatory Visit: Payer: Medicare Other | Admitting: General Practice

## 2014-09-25 VITALS — BP 130/84 | HR 63 | Temp 97.7°F | Wt 272.0 lb

## 2014-09-25 DIAGNOSIS — I4891 Unspecified atrial fibrillation: Secondary | ICD-10-CM

## 2014-09-25 DIAGNOSIS — Z23 Encounter for immunization: Secondary | ICD-10-CM | POA: Diagnosis not present

## 2014-09-25 DIAGNOSIS — Z5181 Encounter for therapeutic drug level monitoring: Secondary | ICD-10-CM

## 2014-09-25 DIAGNOSIS — I10 Essential (primary) hypertension: Secondary | ICD-10-CM | POA: Diagnosis not present

## 2014-09-25 DIAGNOSIS — E0821 Diabetes mellitus due to underlying condition with diabetic nephropathy: Secondary | ICD-10-CM | POA: Diagnosis not present

## 2014-09-25 DIAGNOSIS — Z0000 Encounter for general adult medical examination without abnormal findings: Secondary | ICD-10-CM

## 2014-09-25 DIAGNOSIS — E785 Hyperlipidemia, unspecified: Secondary | ICD-10-CM | POA: Diagnosis not present

## 2014-09-25 DIAGNOSIS — Z0189 Encounter for other specified special examinations: Secondary | ICD-10-CM

## 2014-09-25 LAB — POCT INR: INR: 2.7

## 2014-09-25 MED ORDER — ATORVASTATIN CALCIUM 80 MG PO TABS: 80.0000 mg | ORAL_TABLET | Freq: Every day | ORAL | Status: DC

## 2014-09-25 NOTE — Progress Notes (Signed)
Subjective:    Patient ID: [name], male    DOB: 1955/09/27, 59 y.o.   MRN: [id number]  HPI  Here to f/u; overall doing ok,  Pt denies chest pain, increasing sob or doe, wheezing, orthopnea, PND, increased LE swelling, palpitations, dizziness or syncope.  Pt denies new neurological symptoms such as new headache, or facial or extremity weakness or numbness.  Pt denies polydipsia, polyuria, or low sugar episode.   Pt denies new neurological symptoms such as new headache, or facial or extremity weakness or numbness.   Pt states overall good compliance with meds, mostly trying to follow appropriate diet, with wt overall stable,  but little exercise however. Insurance visit at home with a1c 5.5 on may 11.    Due for prevnar. Past Medical History  Diagnosis Date  . Chronic systolic CHF (congestive heart failure)     a. Likely NICM (out of proportion to CAD). b. 2009 - EF 25-30% by echo, 01/2013: 15-20% by cath. c. s/p St. Jude ICD implantation 2007.  [name] Gout   . Hypokalemia   . HTN (hypertension)   . Lipoma   . Dyslipidemia   . CAD (coronary artery disease)     a. Initial nonobst by cath 2009. b. Cath 01/2013 in setting of VT storm: obstructive distal Cx disease (small and terminates in the AV groove, unlikely to cause significant ischemia or electrical instability), nonobstructive RCA disease, EF 15-20%.   . Atrial fibrillation     a. Noted 05/2008 by EKG.  . Paroxysmal VT     a. s/p St. Jude ICD 2007. b. H/o paroxysmal VT/VF including VT storm 12/2012 admission prompting amio initiation.  . Nonischemic cardiomyopathy   . ICD (implantable cardiac defibrillator) in place   . OSA (obstructive sleep apnea)     does not wear cpap  . DM (diabetes mellitus)     insulin dependent  . Cerebrovascular accident     a. 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 Pulmonary HTN     a. Mild by cath 01/2013.   Past Surgical History  Procedure Laterality Date  . Cardiac catheterization      Nonobstructive  coronary disease 2009  . Cardiac defibrillator placement      ICD-St. Jude  . Lipoma excision    . Left and right heart catheterization with coronary angiogram N/A 01/03/2013    Procedure: LEFT AND RIGHT HEART CATHETERIZATION WITH CORONARY ANGIOGRAM;  Surgeon: Peter M Martinique, MD;  Location: [facility] CATH LAB;  Service: Cardiovascular;  Laterality: N/A;  . Implantable cardioverter defibrillator (icd) generator change N/A 01/15/2014    Procedure: ICD GENERATOR CHANGE;  Surgeon: Evans Lance, MD;  Location: [facility] CATH LAB;  Service: Cardiovascular;  Laterality: N/A;    reports that he quit smoking about 28 years ago. He has never used smokeless tobacco. He reports that he drinks alcohol. He reports that he does not use illicit drugs. family history includes Coronary artery disease in his mother. No Known Allergies Current Outpatient Prescriptions on File Prior to Visit  Medication Sig Dispense Refill  . allopurinol (ZYLOPRIM) 300 MG tablet Take 1 tablet (300 mg total) by mouth daily. 90 tablet 3  . amiodarone (PACERONE) 200 MG tablet TAKE 1 TABLET (200 MG TOTAL) BY MOUTH DAILY. 90 tablet 1  . amLODipine (NORVASC) 10 MG tablet TAKE 1 TABLET BY MOUTH DAILY 90 tablet 3  . atorvastatin (LIPITOR) 40 MG tablet TAKE 1 TABLET (40 MG TOTAL) BY MOUTH DAILY. 90 tablet 3  .  carvedilol (COREG) 25 MG tablet TAKE 2 TABLETS (50 MG TOTAL) BY MOUTH 2 (TWO) TIMES DAILY WITH A MEAL. 360 tablet 3  . furosemide (LASIX) 80 MG tablet TAKE 1 TABLET (80 MG TOTAL) BY MOUTH DAILY. 90 tablet 0  . glucose blood (ONE TOUCH TEST STRIPS) test strip 1 each by Other route See admin instructions. Check blood sugar once daily.    . hydrALAZINE (APRESOLINE) 50 MG tablet TAKE 1 TABLET (50 MG TOTAL) BY MOUTH EVERY 8 (EIGHT) HOURS. 270 tablet 1  . Insulin Glargine (LANTUS SOLOSTAR) 100 UNIT/ML Solostar Pen Inject 10 Units into the skin at bedtime. 30 mL 11  . Insulin Pen Needle (B-D ULTRAFINE III SHORT PEN) 31G X 8 MM MISC Use once daily with  insulin.  Diagnosis 100 each 3  . isosorbide dinitrate (ISORDIL) 20 MG tablet TAKE 1 TABLET (20 MG TOTAL) BY MOUTH 3 (THREE) TIMES DAILY. 270 tablet 1  . KLOR-CON M20 20 MEQ tablet TAKE 2 TABLETS (40 MEQ TOTAL) BY MOUTH DAILY. 180 tablet 3  . metFORMIN (GLUCOPHAGE) 500 MG tablet Take 1 tablet (500 mg total) by mouth daily with breakfast. 90 tablet 3  . Multiple Vitamin (MULTIVITAMIN) capsule Take 1 capsule by mouth daily.     . nitroGLYCERIN (NITROSTAT) 0.4 MG SL tablet Place 1 tablet (0.4 mg total) under the tongue every 5 (five) minutes x 3 doses as needed for chest pain. 25 tablet 2  . ONE TOUCH LANCETS MISC 1 each by Other route See admin instructions. Check blood sugar once daily.    [name] oxyCODONE-acetaminophen (PERCOCET/ROXICET) 5-325 MG per tablet Take 1-2 tablets by mouth every 8 (eight) hours as needed for severe pain. 15 tablet 0  . ramipril (ALTACE) 10 MG capsule TAKE 1 CAPSULE (10 MG TOTAL) BY MOUTH DAILY. 90 capsule 0  . spironolactone (ALDACTONE) 25 MG tablet TAKE 1 TABLET (25 MG TOTAL) BY MOUTH DAILY. 90 tablet 3  . tiZANidine (ZANAFLEX) 4 MG tablet Take 4 mg by mouth every 6 (six) hours as needed for muscle spasms.     [name] warfarin (COUMADIN) 5 MG tablet Take as directed by anticoagulation clinic 180 tablet 1  . amLODipine (NORVASC) 10 MG tablet Take 10 mg by mouth daily.     No current facility-administered medications on file prior to visit.   Review of Systems  Constitutional: Negative for unusual diaphoresis or night sweats HENT: Negative for ringing in ear or discharge Eyes: Negative for double vision or worsening visual disturbance.  Respiratory: Negative for choking and stridor.   Gastrointestinal: Negative for vomiting or other signifcant bowel change Genitourinary: Negative for hematuria or change in urine volume.  Musculoskeletal: Negative for other MSK pain or swelling Skin: Negative for color change and worsening wound.  Neurological: Negative for tremors and numbness  other than noted  Psychiatric/Behavioral: Negative for decreased concentration or agitation other than above       Objective:   Physical Exam BP 130/84 mmHg  Pulse 63  Temp(Src) 97.7 F (36.5 C) (Oral)  Wt 272 lb (123.378 kg)  SpO2 93% VS noted,  Constitutional: Pt appears in no significant distress HENT: Head: NCAT.  Right Ear: External ear normal.  Left Ear: External ear normal.  Eyes: . Pupils are equal, round, and reactive to light. Conjunctivae and EOM are normal Neck: Normal range of motion. Neck supple.  Cardiovascular: Normal rate and regular rhythm.   Pulmonary/Chest: Effort normal and breath sounds without rales or wheezing.  Abd:  Soft, NT, ND, +  BS Neurological: Pt is alert. Not confused , motor grossly intact Skin: Skin is warm. No rash, no LE edema Psychiatric: Pt behavior is normal. No agitation.     Assessment & Plan:

## 2014-09-25 NOTE — Progress Notes (Signed)
Agree with plan 

## 2014-09-25 NOTE — Progress Notes (Signed)
Pre visit review using our clinic review tool, if applicable. No additional management support is needed unless otherwise documented below in the visit note. 

## 2014-09-25 NOTE — Assessment & Plan Note (Signed)
stable overall by history and exam, recent data reviewed with pt, and pt to continue medical treatment as before,  to f/u any worsening symptoms or concerns BP Readings from Last 3 Encounters:  09/25/14 130/84  07/05/14 151/95  05/02/14 90/72

## 2014-09-25 NOTE — Assessment & Plan Note (Signed)
Uncontrolled, to increase lipitor 80 qd,  to f/u any worsening symptoms or concerns

## 2014-09-25 NOTE — Assessment & Plan Note (Signed)
stable overall by history and exam, recent data reviewed with pt, and pt to continue medical treatment as before,  to f/u any worsening symptoms or concerns Lab Results  Component Value Date   HGBA1C 5.2 03/23/2014

## 2014-09-25 NOTE — Patient Instructions (Signed)
You had the new Prevnar pneuomonia shot today  OK to increase the lipitor to 80 mg per day  Please continue all other medications as before, and refills have been done if requested.  Please have the pharmacy call with any other refills you may need.  Please continue your efforts at being more active, low cholesterol diet, and weight control.  You are otherwise up to date with prevention measures today.  Please keep your appointments with your specialists as you may have planned  Please return in 6 months, or sooner if needed, with Lab testing done 3-5 days before

## 2014-09-26 ENCOUNTER — Ambulatory Visit: Payer: Medicare Other

## 2014-10-05 ENCOUNTER — Other Ambulatory Visit: Payer: Self-pay | Admitting: Cardiology

## 2014-10-05 NOTE — Telephone Encounter (Signed)
Rx(s) sent to pharmacy electronically.  

## 2014-10-24 ENCOUNTER — Ambulatory Visit (INDEPENDENT_AMBULATORY_CARE_PROVIDER_SITE_OTHER): Payer: Medicare Other | Admitting: General Practice

## 2014-10-24 DIAGNOSIS — I4891 Unspecified atrial fibrillation: Secondary | ICD-10-CM

## 2014-10-24 DIAGNOSIS — Z5181 Encounter for therapeutic drug level monitoring: Secondary | ICD-10-CM

## 2014-10-24 LAB — POCT INR: INR: 4.6

## 2014-10-24 NOTE — Progress Notes (Signed)
Pre visit review using our clinic review tool, if applicable. No additional management support is needed unless otherwise documented below in the visit note. 

## 2014-10-24 NOTE — Progress Notes (Signed)
I have reviewed and agree with the plan. 

## 2014-10-31 ENCOUNTER — Ambulatory Visit (INDEPENDENT_AMBULATORY_CARE_PROVIDER_SITE_OTHER): Payer: Medicare Other | Admitting: *Deleted

## 2014-10-31 ENCOUNTER — Other Ambulatory Visit: Payer: Self-pay | Admitting: Internal Medicine

## 2014-10-31 ENCOUNTER — Telehealth: Payer: Self-pay | Admitting: Internal Medicine

## 2014-10-31 DIAGNOSIS — I429 Cardiomyopathy, unspecified: Secondary | ICD-10-CM

## 2014-10-31 DIAGNOSIS — I428 Other cardiomyopathies: Secondary | ICD-10-CM

## 2014-10-31 NOTE — Telephone Encounter (Signed)
Informed pt that his transmission received.

## 2014-10-31 NOTE — Progress Notes (Signed)
Remote ICD transmission.   

## 2014-10-31 NOTE — Telephone Encounter (Signed)
New message       1. Has your device fired? No  2. Is you device beeping? No  3. Are you experiencing draining or swelling at device site? No  4. Are you calling to see if we received your device transmission? Yes.  Pt wants to verify if ofc received transmission  5. Have you passed out? No  Please advise.

## 2014-11-04 LAB — CUP PACEART REMOTE DEVICE CHECK
Battery Remaining Longevity: 93 mo
Battery Remaining Percentage: 92 %
Battery Voltage: 3.16 V
Date Time Interrogation Session: 20160629060018
HIGH POWER IMPEDANCE MEASURED VALUE: 43 Ohm
HighPow Impedance: 42 Ohm
Lead Channel Pacing Threshold Amplitude: 1.25 V
Lead Channel Sensing Intrinsic Amplitude: 12 mV
Lead Channel Setting Pacing Amplitude: 2.5 V
Lead Channel Setting Sensing Sensitivity: 0.5 mV
MDC IDC MSMT LEADCHNL RV IMPEDANCE VALUE: 400 Ohm
MDC IDC MSMT LEADCHNL RV PACING THRESHOLD PULSEWIDTH: 0.7 ms
MDC IDC SET LEADCHNL RV PACING PULSEWIDTH: 0.7 ms
MDC IDC STAT BRADY RV PERCENT PACED: 1 %
Pulse Gen Serial Number: 7214340
Zone Setting Detection Interval: 250 ms
Zone Setting Detection Interval: 280 ms
Zone Setting Detection Interval: 320 ms

## 2014-11-11 ENCOUNTER — Other Ambulatory Visit: Payer: Self-pay | Admitting: Internal Medicine

## 2014-11-13 ENCOUNTER — Telehealth: Payer: Self-pay | Admitting: Cardiology

## 2014-11-13 NOTE — Telephone Encounter (Signed)
Left message on voice mail- to call back and speak with any traige nurse

## 2014-11-13 NOTE — Telephone Encounter (Signed)
[facility] is calling to get some clinical information on [name] , Please call   Thanks

## 2014-11-14 ENCOUNTER — Ambulatory Visit (INDEPENDENT_AMBULATORY_CARE_PROVIDER_SITE_OTHER): Payer: Medicare Other | Admitting: General Practice

## 2014-11-14 DIAGNOSIS — I4891 Unspecified atrial fibrillation: Secondary | ICD-10-CM

## 2014-11-14 DIAGNOSIS — Z5181 Encounter for therapeutic drug level monitoring: Secondary | ICD-10-CM

## 2014-11-14 LAB — POCT INR: INR: 2.1

## 2014-11-14 NOTE — Progress Notes (Signed)
Pre visit review using our clinic review tool, if applicable. No additional management support is needed unless otherwise documented below in the visit note. 

## 2014-11-14 NOTE — Progress Notes (Signed)
I have reviewed and agree with the plan. 

## 2014-11-15 ENCOUNTER — Encounter (HOSPITAL_COMMUNITY): Payer: Self-pay | Admitting: Internal Medicine

## 2014-11-15 ENCOUNTER — Inpatient Hospital Stay (HOSPITAL_COMMUNITY)
Admission: EM | Admit: 2014-11-15 | Discharge: 2014-11-21 | DRG: 871 | Disposition: A | Discharge status: Home Health | Payer: Medicare Other | Attending: Internal Medicine | Admitting: Internal Medicine

## 2014-11-15 ENCOUNTER — Emergency Department (HOSPITAL_COMMUNITY): Payer: Medicare Other

## 2014-11-15 DIAGNOSIS — Z8673 Personal history of transient ischemic attack (TIA), and cerebral infarction without residual deficits: Secondary | ICD-10-CM | POA: Diagnosis not present

## 2014-11-15 DIAGNOSIS — Z87891 Personal history of nicotine dependence: Secondary | ICD-10-CM

## 2014-11-15 DIAGNOSIS — M62838 Other muscle spasm: Secondary | ICD-10-CM | POA: Diagnosis not present

## 2014-11-15 DIAGNOSIS — N182 Chronic kidney disease, stage 2 (mild): Secondary | ICD-10-CM | POA: Diagnosis not present

## 2014-11-15 DIAGNOSIS — R0602 Shortness of breath: Secondary | ICD-10-CM | POA: Diagnosis not present

## 2014-11-15 DIAGNOSIS — J9621 Acute and chronic respiratory failure with hypoxia: Secondary | ICD-10-CM | POA: Diagnosis present

## 2014-11-15 DIAGNOSIS — J189 Pneumonia, unspecified organism: Secondary | ICD-10-CM | POA: Diagnosis present

## 2014-11-15 DIAGNOSIS — Z8249 Family history of ischemic heart disease and other diseases of the circulatory system: Secondary | ICD-10-CM | POA: Diagnosis not present

## 2014-11-15 DIAGNOSIS — G4733 Obstructive sleep apnea (adult) (pediatric): Secondary | ICD-10-CM | POA: Diagnosis present

## 2014-11-15 DIAGNOSIS — I5043 Acute on chronic combined systolic (congestive) and diastolic (congestive) heart failure: Secondary | ICD-10-CM | POA: Diagnosis present

## 2014-11-15 DIAGNOSIS — E785 Hyperlipidemia, unspecified: Secondary | ICD-10-CM | POA: Diagnosis not present

## 2014-11-15 DIAGNOSIS — I272 Other secondary pulmonary hypertension: Secondary | ICD-10-CM | POA: Diagnosis present

## 2014-11-15 DIAGNOSIS — I428 Other cardiomyopathies: Secondary | ICD-10-CM

## 2014-11-15 DIAGNOSIS — Z8679 Personal history of other diseases of the circulatory system: Secondary | ICD-10-CM

## 2014-11-15 DIAGNOSIS — R7989 Other specified abnormal findings of blood chemistry: Secondary | ICD-10-CM | POA: Diagnosis present

## 2014-11-15 DIAGNOSIS — I429 Cardiomyopathy, unspecified: Secondary | ICD-10-CM | POA: Diagnosis not present

## 2014-11-15 DIAGNOSIS — Z79899 Other long term (current) drug therapy: Secondary | ICD-10-CM | POA: Diagnosis not present

## 2014-11-15 DIAGNOSIS — J9690 Respiratory failure, unspecified, unspecified whether with hypoxia or hypercapnia: Secondary | ICD-10-CM | POA: Diagnosis not present

## 2014-11-15 DIAGNOSIS — A419 Sepsis, unspecified organism: Secondary | ICD-10-CM | POA: Diagnosis not present

## 2014-11-15 DIAGNOSIS — J9601 Acute respiratory failure with hypoxia: Secondary | ICD-10-CM | POA: Diagnosis not present

## 2014-11-15 DIAGNOSIS — R6521 Severe sepsis with septic shock: Secondary | ICD-10-CM | POA: Diagnosis not present

## 2014-11-15 DIAGNOSIS — I2510 Atherosclerotic heart disease of native coronary artery without angina pectoris: Secondary | ICD-10-CM | POA: Diagnosis not present

## 2014-11-15 DIAGNOSIS — N179 Acute kidney failure, unspecified: Secondary | ICD-10-CM | POA: Diagnosis present

## 2014-11-15 DIAGNOSIS — J9622 Acute and chronic respiratory failure with hypercapnia: Secondary | ICD-10-CM | POA: Diagnosis present

## 2014-11-15 DIAGNOSIS — M109 Gout, unspecified: Secondary | ICD-10-CM | POA: Diagnosis present

## 2014-11-15 DIAGNOSIS — I4729 Other ventricular tachycardia: Secondary | ICD-10-CM

## 2014-11-15 DIAGNOSIS — I509 Heart failure, unspecified: Secondary | ICD-10-CM | POA: Diagnosis not present

## 2014-11-15 DIAGNOSIS — Z95810 Presence of automatic (implantable) cardiac defibrillator: Secondary | ICD-10-CM | POA: Diagnosis present

## 2014-11-15 DIAGNOSIS — E119 Type 2 diabetes mellitus without complications: Secondary | ICD-10-CM

## 2014-11-15 DIAGNOSIS — I129 Hypertensive chronic kidney disease with stage 1 through stage 4 chronic kidney disease, or unspecified chronic kidney disease: Secondary | ICD-10-CM | POA: Diagnosis present

## 2014-11-15 DIAGNOSIS — R778 Other specified abnormalities of plasma proteins: Secondary | ICD-10-CM | POA: Diagnosis present

## 2014-11-15 DIAGNOSIS — I472 Ventricular tachycardia, unspecified: Secondary | ICD-10-CM

## 2014-11-15 DIAGNOSIS — Z794 Long term (current) use of insulin: Secondary | ICD-10-CM

## 2014-11-15 DIAGNOSIS — I480 Paroxysmal atrial fibrillation: Secondary | ICD-10-CM | POA: Diagnosis not present

## 2014-11-15 DIAGNOSIS — I119 Hypertensive heart disease without heart failure: Secondary | ICD-10-CM | POA: Diagnosis present

## 2014-11-15 DIAGNOSIS — Z7901 Long term (current) use of anticoagulants: Secondary | ICD-10-CM

## 2014-11-15 DIAGNOSIS — Z79891 Long term (current) use of opiate analgesic: Secondary | ICD-10-CM

## 2014-11-15 DIAGNOSIS — J188 Other pneumonia, unspecified organism: Secondary | ICD-10-CM | POA: Diagnosis not present

## 2014-11-15 DIAGNOSIS — I5042 Chronic combined systolic (congestive) and diastolic (congestive) heart failure: Secondary | ICD-10-CM | POA: Diagnosis not present

## 2014-11-15 HISTORY — DX: Chronic kidney disease, stage 2 (mild): N18.2

## 2014-11-15 MED ORDER — ALLOPURINOL 300 MG PO TABS
300.0000 mg | ORAL_TABLET | Freq: Every day | ORAL | Status: DC
  Administered 2014-11-16 – 2014-11-21 (×6): 300 mg via ORAL
  Filled 2014-11-15 (×6): qty 1

## 2014-11-15 MED ORDER — ATORVASTATIN CALCIUM 80 MG PO TABS
80.0000 mg | ORAL_TABLET | Freq: Every day | ORAL | Status: DC
  Administered 2014-11-16 – 2014-11-20 (×6): 80 mg via ORAL
  Filled 2014-11-15 (×7): qty 1

## 2014-11-15 MED ORDER — CEFTRIAXONE SODIUM 1 G IJ SOLR
1.0000 g | INTRAMUSCULAR | Status: DC
  Administered 2014-11-15 – 2014-11-20 (×6): 1 g via INTRAVENOUS
  Filled 2014-11-15 (×8): qty 10

## 2014-11-15 MED ORDER — ISOSORBIDE DINITRATE 20 MG PO TABS
20.0000 mg | ORAL_TABLET | Freq: Three times a day (TID) | ORAL | Status: DC
  Administered 2014-11-16 – 2014-11-21 (×13): 20 mg via ORAL
  Filled 2014-11-15 (×19): qty 1

## 2014-11-15 MED ORDER — HYDRALAZINE HCL 50 MG PO TABS
50.0000 mg | ORAL_TABLET | Freq: Three times a day (TID) | ORAL | Status: DC
  Administered 2014-11-16: 50 mg via ORAL
  Filled 2014-11-15 (×5): qty 1

## 2014-11-15 MED ORDER — ONDANSETRON HCL 4 MG/2ML IJ SOLN
INTRAMUSCULAR | Status: AC
  Filled 2014-11-15: qty 2

## 2014-11-15 MED ORDER — NITROGLYCERIN 2 % TD OINT
1.0000 [in_us] | TOPICAL_OINTMENT | Freq: Once | TRANSDERMAL | Status: AC
  Administered 2014-11-15: 1 [in_us] via TOPICAL
  Filled 2014-11-15: qty 1

## 2014-11-15 MED ORDER — AMLODIPINE BESYLATE 10 MG PO TABS
10.0000 mg | ORAL_TABLET | Freq: Every day | ORAL | Status: DC
Start: 2014-11-16 — End: 2014-11-16
  Administered 2014-11-16: 10 mg via ORAL
  Filled 2014-11-15 (×2): qty 1

## 2014-11-15 MED ORDER — INSULIN GLARGINE 100 UNIT/ML ~~LOC~~ SOLN
7.0000 [IU] | Freq: Every day | SUBCUTANEOUS | Status: DC
  Administered 2014-11-16 – 2014-11-20 (×6): 7 [IU] via SUBCUTANEOUS
  Filled 2014-11-15 (×7): qty 0.07

## 2014-11-15 MED ORDER — NITROGLYCERIN IN D5W 200-5 MCG/ML-% IV SOLN
0.0000 ug/min | Freq: Once | INTRAVENOUS | Status: AC
  Administered 2014-11-15: 30 ug/min via INTRAVENOUS
  Filled 2014-11-15: qty 250

## 2014-11-15 MED ORDER — AMLODIPINE BESYLATE 10 MG PO TABS: 10.0000 mg | ORAL_TABLET | Freq: Every day | ORAL | Status: DC

## 2014-11-15 MED ORDER — AMIODARONE HCL 200 MG PO TABS: 200.0000 mg | ORAL_TABLET | Freq: Every day | ORAL | Status: DC

## 2014-11-15 MED ORDER — OXYCODONE-ACETAMINOPHEN 5-325 MG PO TABS: 1.0000 | ORAL_TABLET | Freq: Three times a day (TID) | ORAL | Status: DC | PRN

## 2014-11-15 MED ORDER — ATORVASTATIN CALCIUM 80 MG PO TABS: 80.0000 mg | ORAL_TABLET | Freq: Every day | ORAL | Status: DC

## 2014-11-15 MED ORDER — NITROGLYCERIN 0.4 MG SL SUBL: 0.4000 mg | SUBLINGUAL_TABLET | SUBLINGUAL | Status: DC | PRN

## 2014-11-15 MED ORDER — DEXTROSE 5 % IV SOLN
500.0000 mg | INTRAVENOUS | Status: DC
  Administered 2014-11-15 – 2014-11-19 (×5): 500 mg via INTRAVENOUS
  Filled 2014-11-15 (×7): qty 500

## 2014-11-15 MED ORDER — CARVEDILOL 25 MG PO TABS
25.0000 mg | ORAL_TABLET | Freq: Two times a day (BID) | ORAL | Status: DC
  Filled 2014-11-15: qty 1

## 2014-11-15 MED ORDER — BIOTENE DRY MOUTH MT LIQD
15.0000 mL | Freq: Two times a day (BID) | OROMUCOSAL | Status: DC
  Administered 2014-11-16 – 2014-11-20 (×8): 15 mL via OROMUCOSAL

## 2014-11-15 MED ORDER — ONDANSETRON HCL 4 MG/2ML IJ SOLN
4.0000 mg | Freq: Once | INTRAMUSCULAR | Status: AC
  Administered 2014-11-15: 4 mg via INTRAVENOUS
  Filled 2014-11-15: qty 2

## 2014-11-15 MED ORDER — FUROSEMIDE 10 MG/ML IJ SOLN
80.0000 mg | Freq: Once | INTRAMUSCULAR | Status: AC
  Administered 2014-11-15: 80 mg via INTRAVENOUS
  Filled 2014-11-15: qty 8

## 2014-11-15 MED ORDER — HYDRALAZINE HCL 20 MG/ML IJ SOLN: 5.0000 mg | INTRAMUSCULAR | Status: DC | PRN

## 2014-11-15 MED ORDER — INSULIN ASPART 100 UNIT/ML ~~LOC~~ SOLN
0.0000 [IU] | Freq: Three times a day (TID) | SUBCUTANEOUS | Status: DC
  Administered 2014-11-16 – 2014-11-19 (×4): 1 [IU] via SUBCUTANEOUS
  Administered 2014-11-20: 2 [IU] via SUBCUTANEOUS
  Administered 2014-11-20: 1 [IU] via SUBCUTANEOUS

## 2014-11-15 MED ORDER — IPRATROPIUM-ALBUTEROL 0.5-2.5 (3) MG/3ML IN SOLN
3.0000 mL | RESPIRATORY_TRACT | Status: DC | PRN
  Administered 2014-11-16: 3 mL via RESPIRATORY_TRACT
  Filled 2014-11-15: qty 3

## 2014-11-15 MED ORDER — DM-GUAIFENESIN ER 30-600 MG PO TB12
1.0000 | ORAL_TABLET | Freq: Two times a day (BID) | ORAL | Status: DC
  Administered 2014-11-16 – 2014-11-21 (×12): 1 via ORAL
  Filled 2014-11-15 (×13): qty 1

## 2014-11-15 MED ORDER — CARVEDILOL 25 MG PO TABS
25.0000 mg | ORAL_TABLET | Freq: Two times a day (BID) | ORAL | Status: DC
  Administered 2014-11-16 – 2014-11-21 (×11): 25 mg via ORAL
  Filled 2014-11-15 (×13): qty 1

## 2014-11-15 MED ORDER — ADULT MULTIVITAMIN W/MINERALS CH
1.0000 | ORAL_TABLET | Freq: Every day | ORAL | Status: DC
  Administered 2014-11-16 – 2014-11-21 (×6): 1 via ORAL
  Filled 2014-11-15 (×6): qty 1

## 2014-11-15 MED ORDER — WARFARIN SODIUM 2.5 MG PO TABS
12.5000 mg | ORAL_TABLET | Freq: Once | ORAL | Status: AC
  Administered 2014-11-16: 12.5 mg via ORAL
  Filled 2014-11-15: qty 1

## 2014-11-15 MED ORDER — TIZANIDINE HCL 4 MG PO TABS
4.0000 mg | ORAL_TABLET | Freq: Four times a day (QID) | ORAL | Status: DC | PRN
  Filled 2014-11-15: qty 1

## 2014-11-15 MED ORDER — AMIODARONE HCL 200 MG PO TABS
200.0000 mg | ORAL_TABLET | Freq: Every day | ORAL | Status: DC
  Administered 2014-11-16 – 2014-11-20 (×6): 200 mg via ORAL
  Filled 2014-11-15 (×7): qty 1

## 2014-11-15 MED ORDER — WARFARIN - PHARMACIST DOSING INPATIENT
Freq: Every day | Status: DC
  Administered 2014-11-19: 22:00:00

## 2014-11-15 MED ORDER — RAMIPRIL 10 MG PO CAPS
10.0000 mg | ORAL_CAPSULE | Freq: Every day | ORAL | Status: DC
  Filled 2014-11-15: qty 1

## 2014-11-15 NOTE — ED Notes (Signed)
MAIN LAB called for blood results; sts they will tube printed version to B pod tube system

## 2014-11-15 NOTE — H&P (Addendum)
[facility] History and Physical  [name] [id number] DOB: 1956-01-04 DOA: 11/15/2014  Referring physician: ED physician PCP: Cathlean Cower, MD  Specialists:   Chief Complaint: Cough, shortness of breath and chills  HPI: [name] is a 59 y.o. male with PMH of hypertension, hyperlipidemia, diabetes mellitus, gout, combined systolic and diastolic congestive heart failure (EF 25-30% with grade 1 diastolic dysfunction), chronic kidney disease-stage II, CAD, atrial fibrillation on Coumadin, AICD, OSA not on CPAP, stroke, pulmonary hypertension, who presents with cough, shortness of breath and chills.  Patient reports that he started coughing and having shortness of breath this afternoon. He has dry cough, no runny nose, subjective fever, but has chills. He does not have chest pain. Vision does not have abdominal pain, diarrhea, rashes, symptoms of UTI or unilateral weakness. Of note, patient states that he may have coughed up tiny streaks of blood in the emergency room, but tried to confirm this two nurses who are taking care of the patient. Nurse did not see any blood coughed up by patient.  In ED, patient was found to have multiple infiltration on chest x-ray. WBC 12.2, hemoglobin 14.1, temperature 100.4, tachycardia, and are 1.85, troponin 0.09, BNP 248.3, normal renal function. Patient is admitted to inpatient for further evaluation and treatment.  Where does patient live?   At home Can patient participate in ADLs?  Yes    Review of Systems:   General: no subjective fever (temperature 100.4), has chills,  has poor appetite, has fatigue HEENT: no blurry vision, hearing changes or sore throat Pulm: has dyspnea, coughing, no wheezing CV: no chest pain, palpitations Abd: no nausea, vomiting, abdominal pain, diarrhea, constipation GU: no dysuria, burning on urination, increased urinary frequency, hematuria  Ext: has leg edema Neuro: no unilateral weakness, numbness, or  tingling, no vision change or hearing loss Skin: no rash MSK: No muscle spasm, no deformity, no limitation of range of movement in spin Heme: No easy bruising.  Travel history: No recent long distant travel.  Allergy: No Known Allergies  Past Medical History  Diagnosis Date  . Chronic systolic CHF (congestive heart failure)     a. Likely NICM (out of proportion to CAD). b. 2009 - EF 25-30% by echo, 01/2013: 15-20% by cath. c. s/p St. Jude ICD implantation 2007.  [name] Gout   . Hypokalemia   . HTN (hypertension)   . Lipoma   . Dyslipidemia   . CAD (coronary artery disease)     a. Initial nonobst by cath 2009. b. Cath 01/2013 in setting of VT storm: obstructive distal Cx disease (small and terminates in the AV groove, unlikely to cause significant ischemia or electrical instability), nonobstructive RCA disease, EF 15-20%.   . Atrial fibrillation     a. Noted 05/2008 by EKG.  . Paroxysmal VT     a. s/p St. Jude ICD 2007. b. H/o paroxysmal VT/VF including VT storm 12/2012 admission prompting amio initiation.  . Nonischemic cardiomyopathy   . ICD (implantable cardiac defibrillator) in place   . OSA (obstructive sleep apnea)     does not wear cpap  . DM (diabetes mellitus)     insulin dependent  . Cerebrovascular accident     a. 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 Pulmonary HTN     a. Mild by cath 01/2013.  . CKD (chronic kidney disease), stage II     Past Surgical History  Procedure Laterality Date  . Cardiac catheterization      Nonobstructive coronary  disease 2009  . Cardiac defibrillator placement      ICD-St. Jude  . Lipoma excision    . Left and right heart catheterization with coronary angiogram N/A 01/03/2013    Procedure: LEFT AND RIGHT HEART CATHETERIZATION WITH CORONARY ANGIOGRAM;  Surgeon: Peter M Martinique, MD;  Location: [facility] CATH LAB;  Service: Cardiovascular;  Laterality: N/A;  . Implantable cardioverter defibrillator (icd) generator change N/A 01/15/2014    Procedure: ICD  GENERATOR CHANGE;  Surgeon: Evans Lance, MD;  Location: [facility] CATH LAB;  Service: Cardiovascular;  Laterality: N/A;    Social History:  reports that he quit smoking about 28 years ago. He has never used smokeless tobacco. He reports that he drinks alcohol. He reports that he does not use illicit drugs.  Family History:  Family History  Problem Relation Age of Onset  . Coronary artery disease Mother      Prior to Admission medications   Medication Sig Start Date End Date Taking? Authorizing Provider  allopurinol (ZYLOPRIM) 300 MG tablet TAKE 1 TABLET (300 MG TOTAL) BY MOUTH DAILY. 10/31/14   Biagio Borg, MD  amiodarone (PACERONE) 200 MG tablet TAKE 1 TABLET (200 MG TOTAL) BY MOUTH DAILY. 08/20/14   Minus Breeding, MD  amLODipine (NORVASC) 10 MG tablet Take 10 mg by mouth daily.    Historical Provider, MD  amLODipine (NORVASC) 10 MG tablet TAKE 1 TABLET BY MOUTH DAILY 05/18/14   Minus Breeding, MD  atorvastatin (LIPITOR) 80 MG tablet Take 1 tablet (80 mg total) by mouth daily. 09/25/14   Biagio Borg, MD  carvedilol (COREG) 25 MG tablet TAKE 2 TABLETS (50 MG TOTAL) BY MOUTH 2 (TWO) TIMES DAILY WITH A MEAL. 05/24/14   Minus Breeding, MD  furosemide (LASIX) 80 MG tablet TAKE 1 TABLET (80 MG TOTAL) BY MOUTH DAILY. 10/05/14   Minus Breeding, MD  glucose blood (ONE TOUCH TEST STRIPS) test strip 1 each by Other route See admin instructions. Check blood sugar once daily.    Historical Provider, MD  hydrALAZINE (APRESOLINE) 50 MG tablet TAKE 1 TABLET (50 MG TOTAL) BY MOUTH EVERY 8 (EIGHT) HOURS. 06/22/14   Minus Breeding, MD  Insulin Glargine (LANTUS SOLOSTAR) 100 UNIT/ML Solostar Pen Inject 10 Units into the skin at bedtime. 09/19/13   Biagio Borg, MD  Insulin Pen Needle (B-D ULTRAFINE III SHORT PEN) 31G X 8 MM MISC Use once daily with insulin.  Diagnosis 03/23/14   Biagio Borg, MD  isosorbide dinitrate (ISORDIL) 20 MG tablet TAKE 1 TABLET (20 MG TOTAL) BY MOUTH 3 (THREE) TIMES DAILY. 06/22/14   Minus Breeding, MD  KLOR-CON M20 20 MEQ tablet TAKE 2 TABLETS (40 MEQ TOTAL) BY MOUTH DAILY. 04/18/14   Minus Breeding, MD  metFORMIN (GLUCOPHAGE) 500 MG tablet Take 1 tablet (500 mg total) by mouth daily with breakfast. 03/26/14   Biagio Borg, MD  metFORMIN (GLUCOPHAGE) 500 MG tablet TAKE 1 TABLET (500 MG TOTAL) BY MOUTH 2 (TWO) TIMES DAILY. 11/12/14   Biagio Borg, MD  Multiple Vitamin (MULTIVITAMIN) capsule Take 1 capsule by mouth daily.     Historical Provider, MD  nitroGLYCERIN (NITROSTAT) 0.4 MG SL tablet Place 1 tablet (0.4 mg total) under the tongue every 5 (five) minutes x 3 doses as needed for chest pain. 01/12/14   Minus Breeding, MD  ONE TOUCH LANCETS MISC 1 each by Other route See admin instructions. Check blood sugar once daily.    Historical Provider, MD  oxyCODONE-acetaminophen (PERCOCET/ROXICET) 5-325 MG  per tablet Take 1-2 tablets by mouth every 8 (eight) hours as needed for severe pain. 07/05/14   Glendell Docker, NP  ramipril (ALTACE) 10 MG capsule TAKE 1 CAPSULE (10 MG TOTAL) BY MOUTH DAILY. 08/14/14   Minus Breeding, MD  spironolactone (ALDACTONE) 25 MG tablet TAKE 1 TABLET (25 MG TOTAL) BY MOUTH DAILY. 05/02/14   Minus Breeding, MD  tiZANidine (ZANAFLEX) 4 MG tablet Take 4 mg by mouth every 6 (six) hours as needed for muscle spasms.     Historical Provider, MD  warfarin (COUMADIN) 5 MG tablet Take as directed by anticoagulation clinic 08/13/14   Biagio Borg, MD    Physical Exam: Filed Vitals:   11/15/14 2124 11/15/14 2130 11/15/14 2149 11/15/14 2200  BP: 109/77 128/85  112/71  Pulse: 79 86  78  Temp:   100.4 F (38 C)   TempSrc:   Oral   Resp: 20 20  18   SpO2: 99% 99%  99%   General: moderately distressed.  HEENT:       Eyes: PERRL, EOMI, no scleral icterus.       ENT: No discharge from the ears and nose, no pharynx injection, no tonsillar enlargement.        Neck: diffuclt to assess JVD due to obesity, no bruit, no mass felt. Heme: No neck lymph node  enlargement. Cardiac: S1/S2, RRR, No murmurs, No gallops or rubs. Pulm: has rale bilaterally (worse on the right side), No wheezing or rubs. Abd: Soft, nondistended, nontender, no rebound pain, no organomegaly, BS present. Ext: 1+ pitting leg edema bilaterally. 2+DP/PT pulse bilaterally. Musculoskeletal: No joint deformities, No joint redness or warmth, no limitation of ROM in spin. Skin: No rashes.  Neuro: Alert, oriented X3, cranial nerves II-XII grossly intact, muscle strength 5/5 in all extremities, sensation to light touch intact.  Psych: Patient is not psychotic, no suicidal or hemocidal ideation.  Labs on Admission:  Basic Metabolic Panel: No results for input(s): NA, K, CL, CO2, GLUCOSE, BUN, CREATININE, CALCIUM, MG, PHOS in the last 168 hours. Liver Function Tests: No results for input(s): AST, ALT, ALKPHOS, BILITOT, PROT, ALBUMIN in the last 168 hours. No results for input(s): LIPASE, AMYLASE in the last 168 hours. No results for input(s): AMMONIA in the last 168 hours. CBC: No results for input(s): WBC, NEUTROABS, HGB, HCT, MCV, PLT in the last 168 hours. Cardiac Enzymes: No results for input(s): CKTOTAL, CKMB, CKMBINDEX, TROPONINI in the last 168 hours.  BNP (last 3 results) No results for input(s): BNP in the last 8760 hours.  ProBNP (last 3 results) No results for input(s): PROBNP in the last 8760 hours.  CBG: No results for input(s): GLUCAP in the last 168 hours.  Radiological Exams on Admission: Dg Chest Port 1 View  11/15/2014   CLINICAL DATA:  Shortness of breath, on BiPAP  EXAM: PORTABLE CHEST - 1 VIEW  COMPARISON:  12/29/2012  FINDINGS: Multifocal patchy opacities in the right lung and possibly in the left suprahilar region. This appearance favors multifocal pneumonia, less likely interstitial edema.  No pleural effusion or pneumothorax.  Cardiomegaly.  Left subclavian ICD.  IMPRESSION: Multifocal patchy opacities, right lung predominant, suspicious for multifocal  pneumonia.  Interstitial edema is considered less likely.   Electronically Signed   By: Julian Hy M.D.   On: 11/15/2014 18:19    EKG: Independently reviewed.  Abnormal findings: EKG is poor quality, difficult to assess, but seems to be in atrial fibrillation, will repeat EKG   Assessment/Plan Principal Problem:  CAP (community acquired pneumonia) Active Problems:   Diabetes   Hyperlipidemia   Gout   Obstructive sleep apnea   Essential hypertension   Paroxysmal ventricular tachycardia   Chronic combined systolic and diastolic CHF (congestive heart failure)   History of cardiovascular disorder   Automatic implantable cardioverter-defibrillator in situ   Warfarin anticoagulation   PAF (paroxysmal atrial fibrillation)   Sepsis   Acute respiratory failure with hypoxia   Pneumonia   CKD (chronic kidney disease), stage II  Addendum: Patient developed hypotension, after 2.8 L NS bolus, bp still low-->consulted PCCM  CAP and sepsis: Patient's cough, shortness of breath plus chest x-ray findings are consistent with pneumonia. His septic on admission with fever, tachycardia and leukocytosis. He is hemodynamically stable. Patient received 1 dose of Lasix, 80 mg 1 in the emergency room with initial concerns for CHF exacerbation which was before CXR  was obtained.  - Will admit to SDU - IV rocephin and Aztreonam.  - Mucinex for cough  - DuoNeb neb prn for SOB - Urine legionella and S. pneumococcal antigen - Follow up blood culture x2, sputum culture  plus Flu pcr - will get Procalcitonin and trend lactic acid level per sepsis protocol - IVF: 50 mL per hour of NS (patient has EF 25 to 30%, limiting aggressive IV fluids treatment)  Elevated troponin: Troponin 0.09 on admission, likely due to demand ischemia due to sepsis. Patient does not have chest pain -trop x 3 -follow up 2d echo -ekg in am -on coreg, coumadin and lipitor  DM-II: Last A1c 5.2 on 02/20/14, well controled.  Patient is taking metformin and Lantus at home -will decrease Lantus dose from 10 units to 70 units daily  -SSI  HLD: Last LDL was 105 on 03/23/14 -Continue home medications: Lipitor  Gout: stable. -Continue allopurinol  HTN: -hold ramipril, hydralazine and amlodipine since patient is to risk of developing hypotension to sepsis -Continue Coreg -IV hydralazine when necessary  Chronic combined systolic and diastolic CHF: 2-D echo on 12/31/11 showed EF 25-30% with grade 1 diastolic dysfunction. Patient is on Lasix and spironolactone at home. Patient still has 1+ leg edema, but BNP is 248.3, indicating no exacerbation. -Hold her Lasix and spironolactone due to sepsis -Continue Coreg  Atrial Fibrillation: CHA2DS2-VASc Score is 6, needs oral anticoagulation. Patient is on Coumadin.  INR is 1.85 on admission. Heart rate is controlled. -continue Coumadin per pharmacy -Amiodarone, Coreg -has AICD   CKD (chronic kidney disease), stage II: Previously creatinine is 1.4 on 03/23/14, his creatinine is 1.45 on admission, which is close to baseline.  -follow-up renal function at [facility]  DVT ppx: on coumadin  Code Status: Full code Family Communication: None at bed side.      Disposition Plan: Admit to inpatient   Date of Service 11/15/2014    Ivor Costa [facility] Pager [phone]  If 7PM-7AM, please contact night-coverage [email or web address] Password [facility] 11/15/2014, 10:16 PM

## 2014-11-15 NOTE — Telephone Encounter (Signed)
Patient has enrolled into the biometic program with [facility] The program will watching weight , pulse ,bloo blood pressure.  Wanted to know recent EF , B/P,PULSE WEIGHT? REPORTED LAST EF 2013 --25-30% Last vital 09/2014 given. Harmon Pier verbalized understanding.

## 2014-11-15 NOTE — ED Notes (Addendum)
Pt alert at this time. Pt still working hard to breath and drowzy. Pt is easily aroused by voice.

## 2014-11-15 NOTE — ED Provider Notes (Signed)
I saw and evaluated the patient, reviewed the resident's note and I agree with the findings and plan.   EKG Interpretation   Date/Time:  Thursday November 15 2014 17:50:43 EDT Ventricular Rate:  113 PR Interval:  196 QRS Duration: 102 QT Interval:  334 QTC Calculation: 458 R Axis:   146 Text Interpretation:  Poor data quality in current ECG precludes serial  comparison Confirmed by Sharnelle Cappelli  MD, Geoffery Aultman ([id number]) on 11/15/2014 6:04:52 PM     Patient here after acute onset of shortness of breath just prior to arrival. Does have a history of CHF. Patient has audible rales on exam. Patient placed medially on BiPAP as well as given nitroglycerin sublingual. Will order chest x-ray as well as patient given Lasix 80 mg IV push. Anticipate admission  Lacretia Leigh, MD 11/15/14 [phone]

## 2014-11-15 NOTE — Telephone Encounter (Signed)
Left message on voice mail to call back- speak to any triage nurse

## 2014-11-15 NOTE — ED Provider Notes (Signed)
CSN: [id number]     Arrival date & time 11/15/14  1743 History   First MD Initiated Contact with Patient 11/15/14 1801     Chief Complaint  Patient presents with  . Shortness of Breath    (Consider location/radiation/quality/duration/timing/severity/associated sxs/prior Treatment) HPI  Patient is a 59 year old male with a history of CHF EF 25-30% presented today for acute onset of shortness of breath. Wife reports patient has been normal for the last 2 days but today began having chest discomfort began having shortness of breath. Wife reports 30 min prior to arrival acutely worsened to point where pt felt could not breath.  Subsequent brought to the emergency department for further evaluation.  Wife denies any fevers, cough, n/v prior to event.  Wife admits to one similar previous episode.    Past Medical History  Diagnosis Date  . Chronic systolic CHF (congestive heart failure)     a. Likely NICM (out of proportion to CAD). b. 2009 - EF 25-30% by echo, 01/2013: 15-20% by cath. c. s/p St. Jude ICD implantation 2007.  [name] Gout   . Hypokalemia   . HTN (hypertension)   . Lipoma   . Dyslipidemia   . CAD (coronary artery disease)     a. Initial nonobst by cath 2009. b. Cath 01/2013 in setting of VT storm: obstructive distal Cx disease (small and terminates in the AV groove, unlikely to cause significant ischemia or electrical instability), nonobstructive RCA disease, EF 15-20%.   . Atrial fibrillation     a. Noted 05/2008 by EKG.  . Paroxysmal VT     a. s/p St. Jude ICD 2007. b. H/o paroxysmal VT/VF including VT storm 12/2012 admission prompting amio initiation.  . Nonischemic cardiomyopathy   . ICD (implantable cardiac defibrillator) in place   . OSA (obstructive sleep apnea)     does not wear cpap  . DM (diabetes mellitus)     insulin dependent  . Cerebrovascular accident     a. 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 Pulmonary HTN     a. Mild by cath 01/2013.   Past Surgical History   Procedure Laterality Date  . Cardiac catheterization      Nonobstructive coronary disease 2009  . Cardiac defibrillator placement      ICD-St. Jude  . Lipoma excision    . Left and right heart catheterization with coronary angiogram N/A 01/03/2013    Procedure: LEFT AND RIGHT HEART CATHETERIZATION WITH CORONARY ANGIOGRAM;  Surgeon: Peter M Martinique, MD;  Location: [facility] CATH LAB;  Service: Cardiovascular;  Laterality: N/A;  . Implantable cardioverter defibrillator (icd) generator change N/A 01/15/2014    Procedure: ICD GENERATOR CHANGE;  Surgeon: Evans Lance, MD;  Location: [facility] CATH LAB;  Service: Cardiovascular;  Laterality: N/A;   Family History  Problem Relation Age of Onset  . Coronary artery disease Mother    History  Substance Use Topics  . Smoking status: Former Smoker    Quit date: 05/04/1986  . Smokeless tobacco: Never Used     Comment: Quit smoking 30 yrs ago. Smoked as teenager less than 1/2 ppd. Smoked x 4 years.  . Alcohol Use: Yes     Comment: occasionally    Review of Systems  Unable to perform ROS: Acuity of condition    Allergies  Review of patient's allergies indicates no known allergies.  Home Medications   Prior to Admission medications   Medication Sig Start Date End Date Taking? Authorizing Provider  allopurinol (ZYLOPRIM) 300 MG  tablet TAKE 1 TABLET (300 MG TOTAL) BY MOUTH DAILY. 10/31/14   Biagio Borg, MD  amiodarone (PACERONE) 200 MG tablet TAKE 1 TABLET (200 MG TOTAL) BY MOUTH DAILY. 08/20/14   Minus Breeding, MD  amLODipine (NORVASC) 10 MG tablet Take 10 mg by mouth daily.    Historical Provider, MD  amLODipine (NORVASC) 10 MG tablet TAKE 1 TABLET BY MOUTH DAILY 05/18/14   Minus Breeding, MD  atorvastatin (LIPITOR) 80 MG tablet Take 1 tablet (80 mg total) by mouth daily. 09/25/14   Biagio Borg, MD  carvedilol (COREG) 25 MG tablet TAKE 2 TABLETS (50 MG TOTAL) BY MOUTH 2 (TWO) TIMES DAILY WITH A MEAL. 05/24/14   Minus Breeding, MD  furosemide (LASIX) 80 MG  tablet TAKE 1 TABLET (80 MG TOTAL) BY MOUTH DAILY. 10/05/14   Minus Breeding, MD  glucose blood (ONE TOUCH TEST STRIPS) test strip 1 each by Other route See admin instructions. Check blood sugar once daily.    Historical Provider, MD  hydrALAZINE (APRESOLINE) 50 MG tablet TAKE 1 TABLET (50 MG TOTAL) BY MOUTH EVERY 8 (EIGHT) HOURS. 06/22/14   Minus Breeding, MD  Insulin Glargine (LANTUS SOLOSTAR) 100 UNIT/ML Solostar Pen Inject 10 Units into the skin at bedtime. 09/19/13   Biagio Borg, MD  Insulin Pen Needle (B-D ULTRAFINE III SHORT PEN) 31G X 8 MM MISC Use once daily with insulin.  Diagnosis 03/23/14   Biagio Borg, MD  isosorbide dinitrate (ISORDIL) 20 MG tablet TAKE 1 TABLET (20 MG TOTAL) BY MOUTH 3 (THREE) TIMES DAILY. 06/22/14   Minus Breeding, MD  KLOR-CON M20 20 MEQ tablet TAKE 2 TABLETS (40 MEQ TOTAL) BY MOUTH DAILY. 04/18/14   Minus Breeding, MD  metFORMIN (GLUCOPHAGE) 500 MG tablet Take 1 tablet (500 mg total) by mouth daily with breakfast. 03/26/14   Biagio Borg, MD  metFORMIN (GLUCOPHAGE) 500 MG tablet TAKE 1 TABLET (500 MG TOTAL) BY MOUTH 2 (TWO) TIMES DAILY. 11/12/14   Biagio Borg, MD  Multiple Vitamin (MULTIVITAMIN) capsule Take 1 capsule by mouth daily.     Historical Provider, MD  nitroGLYCERIN (NITROSTAT) 0.4 MG SL tablet Place 1 tablet (0.4 mg total) under the tongue every 5 (five) minutes x 3 doses as needed for chest pain. 01/12/14   Minus Breeding, MD  ONE TOUCH LANCETS MISC 1 each by Other route See admin instructions. Check blood sugar once daily.    Historical Provider, MD  oxyCODONE-acetaminophen (PERCOCET/ROXICET) 5-325 MG per tablet Take 1-2 tablets by mouth every 8 (eight) hours as needed for severe pain. 07/05/14   Glendell Docker, NP  ramipril (ALTACE) 10 MG capsule TAKE 1 CAPSULE (10 MG TOTAL) BY MOUTH DAILY. 08/14/14   Minus Breeding, MD  spironolactone (ALDACTONE) 25 MG tablet TAKE 1 TABLET (25 MG TOTAL) BY MOUTH DAILY. 05/02/14   Minus Breeding, MD  tiZANidine (ZANAFLEX) 4  MG tablet Take 4 mg by mouth every 6 (six) hours as needed for muscle spasms.     Historical Provider, MD  warfarin (COUMADIN) 5 MG tablet Take as directed by anticoagulation clinic 08/13/14   Biagio Borg, MD   There were no vitals taken for this visit. Physical Exam  Constitutional: He appears distressed.  HENT:  Head: Normocephalic and atraumatic.  Eyes: Conjunctivae are normal. Pupils are equal, round, and reactive to light.  Neck: Normal range of motion. Neck supple.  Cardiovascular: Tachycardia present.   Pulses:      Radial pulses are 2+ on the right  side, and 2+ on the left side.  Pulmonary/Chest: Accessory muscle usage present. He is in respiratory distress. He has decreased breath sounds. He has no wheezes. He has rales.  Abdominal: He exhibits distension. There is no tenderness. There is no rigidity, no rebound, no guarding, no tenderness at McBurney's point and negative Murphy's sign.  Skin: He is diaphoretic.    ED Course  Procedures (including critical care time) Labs Review Labs Reviewed - No data to display  Imaging Review No results found.   EKG Interpretation None      MDM   Final diagnoses:  None   On initial evaluation the patient was in respiratory distress. He was satting in the mid 83s on room air. Immediately placed on BiPAP. History of CHF with rails on exam and Nitropaste placed on patient.  Sats began to increase and stabilized in the mid 90s. Chest x-ray performed showing possible multifocal pneumonia and less likely CHF. WBC 12.2, hemoglobin 14.1, temperature 100.4, and are 1.85, troponin 0.09, BNP 248.3, normal renal function.  Prior to this he was given Lasix and placed on nitro drip hour. Blood pressure remained in normal within normal limits. Antibiotics given for community-acquired pneumonia and sepsis. Did not start fluids in the emergency department due to concurrent CHF and awaiting hospitalist recommendations. Patient ultimately titrated to  nasal cannula in the emergency department.  Discussed with Dr. Blaine Hamper who admitted pt for pneumonia and sepsis.    If performed, labs, EKGs, and imaging were reviewed/interpreted by myself and my attending and incorporated into medical decision making.  Discussed pertinent finding with patient or caregiver prior to admission with no further questions.  Pt care supervised by my attending Dr. Gearlean Alf, MD PGY-2  Emergency Medicine       Geronimo Boot, MD 11/16/14 [phone]

## 2014-11-15 NOTE — Progress Notes (Signed)
RT Assessment: Pt removed from BIPAP to assess prior to transport. Pt placed on 2L-3L [place]. SpO2 dropped to low-mid 80's. Switched to NRB and SpO2 slowly improved to 98%. Pt is in no distress at this time. Pt is being transported to [place]. Report has been called to [place], RT. BIPAP order has been changed to PRN by MD. RT did explain to the pt if he should begin to feel any discomfort or difficulty breathing to inform RN/RT.

## 2014-11-15 NOTE — ED Notes (Signed)
Patient asked to be re-adjusted on the bed back to lower it a bit. Task completed.

## 2014-11-15 NOTE — ED Notes (Signed)
Pt states acute onset chills and sob while sitting on couch.  Took nitro with no relief.  Sats of 75 on ra.  89% on nrb.  Chest pain.

## 2014-11-15 NOTE — Progress Notes (Addendum)
ANTICOAGULATION CONSULT NOTE - Initial Consult  Pharmacy Consult for coumadin Indication: afib/CVA  No Known Allergies  Vital Signs: Temp: 100.4 F (38 C) (07/14 2149) Temp Source: Oral (07/14 2149) BP: 112/71 mmHg (07/14 2200) Pulse Rate: 78 (07/14 2200)  Labs:  Recent Labs  11/14/14  INR 2.1    CrCl cannot be calculated (Unknown ideal weight.).   Medical History: Past Medical History  Diagnosis Date  . Chronic systolic CHF (congestive heart failure)     a. Likely NICM (out of proportion to CAD). b. 2009 - EF 25-30% by echo, 01/2013: 15-20% by cath. c. s/p St. Jude ICD implantation 2007.  [name] Gout   . Hypokalemia   . HTN (hypertension)   . Lipoma   . Dyslipidemia   . CAD (coronary artery disease)     a. Initial nonobst by cath 2009. b. Cath 01/2013 in setting of VT storm: obstructive distal Cx disease (small and terminates in the AV groove, unlikely to cause significant ischemia or electrical instability), nonobstructive RCA disease, EF 15-20%.   . Atrial fibrillation     a. Noted 05/2008 by EKG.  . Paroxysmal VT     a. s/p St. Jude ICD 2007. b. H/o paroxysmal VT/VF including VT storm 12/2012 admission prompting amio initiation.  . Nonischemic cardiomyopathy   . ICD (implantable cardiac defibrillator) in place   . OSA (obstructive sleep apnea)     does not wear cpap  . DM (diabetes mellitus)     insulin dependent  . Cerebrovascular accident     a. 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 Pulmonary HTN     a. Mild by cath 01/2013.  . CKD (chronic kidney disease), stage II     Assessment: 59 yo male on coumadin PTA for history of CVA/afib. Pharmacy has been consulted to dose. INR today was 1.85  -Labs are not reporting to [facility] but a printed version was available  Home regimen: 10mg /day except take 5mg  on MWF (last seen in coumadin clinic 7/13; dose was also verified with patient and he has not taken his dose today).  Goal of Therapy:  INR 2-3 Monitor platelets by  anticoagulation protocol: Yes   Plan:  -Coumadin 12.5mg  po today -Daily PT/INR  Hildred Laser, Pharm D 11/15/2014 10:25 PM

## 2014-11-16 ENCOUNTER — Inpatient Hospital Stay (HOSPITAL_COMMUNITY): Payer: Medicare Other

## 2014-11-16 ENCOUNTER — Ambulatory Visit (HOSPITAL_COMMUNITY): Payer: Medicare Other

## 2014-11-16 DIAGNOSIS — A419 Sepsis, unspecified organism: Secondary | ICD-10-CM | POA: Diagnosis present

## 2014-11-16 DIAGNOSIS — I509 Heart failure, unspecified: Secondary | ICD-10-CM

## 2014-11-16 DIAGNOSIS — R6521 Severe sepsis with septic shock: Secondary | ICD-10-CM

## 2014-11-16 LAB — CBC WITH DIFFERENTIAL/PLATELET
BASOS ABS: 0 10*3/uL (ref 0.0–0.1)
BASOS PCT: 0 % (ref 0–1)
Eosinophils Absolute: 0 10*3/uL (ref 0.0–0.7)
Eosinophils Relative: 0 % (ref 0–5)
HEMATOCRIT: 34.6 % — AB (ref 39.0–52.0)
HEMOGLOBIN: 11.2 g/dL — AB (ref 13.0–17.0)
LYMPHS ABS: 1 10*3/uL (ref 0.7–4.0)
Lymphocytes Relative: 5 % — ABNORMAL LOW (ref 12–46)
MCH: 31.8 pg (ref 26.0–34.0)
MCHC: 32.4 g/dL (ref 30.0–36.0)
MCV: 98.3 fL (ref 78.0–100.0)
Monocytes Absolute: 0.9 10*3/uL (ref 0.1–1.0)
Monocytes Relative: 5 % (ref 3–12)
Neutro Abs: 16.6 10*3/uL — ABNORMAL HIGH (ref 1.7–7.7)
Neutrophils Relative %: 90 % — ABNORMAL HIGH (ref 43–77)
PLATELETS: 163 10*3/uL (ref 150–400)
RBC: 3.52 MIL/uL — ABNORMAL LOW (ref 4.22–5.81)
RDW: 14.1 % (ref 11.5–15.5)
WBC: 18.5 10*3/uL — AB (ref 4.0–10.5)

## 2014-11-16 LAB — BASIC METABOLIC PANEL
Anion gap: 6 (ref 5–15)
BUN: 23 mg/dL — ABNORMAL HIGH (ref 6–20)
CALCIUM: 8.2 mg/dL — AB (ref 8.9–10.3)
CHLORIDE: 109 mmol/L (ref 101–111)
CO2: 25 mmol/L (ref 22–32)
CREATININE: 1.92 mg/dL — AB (ref 0.61–1.24)
GFR calc Af Amer: 43 mL/min — ABNORMAL LOW (ref >60)
GFR, EST NON AFRICAN AMERICAN: 37 mL/min — AB (ref >60)
Glucose, Bld: 97 mg/dL (ref 65–99)
POTASSIUM: 3.9 mmol/L (ref 3.5–5.1)
Sodium: 140 mmol/L (ref 135–145)

## 2014-11-16 LAB — URINALYSIS, ROUTINE W REFLEX MICROSCOPIC
Bilirubin Urine: NEGATIVE
Glucose, UA: NEGATIVE mg/dL
Hgb urine dipstick: NEGATIVE
Ketones, ur: NEGATIVE mg/dL
Leukocytes, UA: NEGATIVE
Nitrite: NEGATIVE
PROTEIN: NEGATIVE mg/dL
Specific Gravity, Urine: 1.017 (ref 1.005–1.030)
UROBILINOGEN UA: 1 mg/dL (ref 0.0–1.0)
pH: 5 (ref 5.0–8.0)

## 2014-11-16 LAB — INFLUENZA PANEL BY PCR (TYPE A & B)
H1N1 flu by pcr: NOT DETECTED
INFLBPCR: NEGATIVE
Influenza A By PCR: NEGATIVE

## 2014-11-16 LAB — COMPREHENSIVE METABOLIC PANEL
ALT: 27 U/L (ref 17–63)
AST: 34 U/L (ref 15–41)
Albumin: 3.9 g/dL (ref 3.5–5.0)
Alkaline Phosphatase: 71 U/L (ref 38–126)
Anion gap: 11 (ref 5–15)
BUN: 16 mg/dL (ref 6–20)
CO2: 24 mmol/L (ref 22–32)
CREATININE: 1.45 mg/dL — AB (ref 0.61–1.24)
Calcium: 9.1 mg/dL (ref 8.9–10.3)
Chloride: 105 mmol/L (ref 101–111)
GFR calc Af Amer: 60 mL/min — ABNORMAL LOW (ref >60)
GFR calc non Af Amer: 52 mL/min — ABNORMAL LOW (ref >60)
Glucose, Bld: 123 mg/dL — ABNORMAL HIGH (ref 65–99)
Potassium: 4.4 mmol/L (ref 3.5–5.1)
SODIUM: 140 mmol/L (ref 135–145)
Total Bilirubin: 1.2 mg/dL (ref 0.3–1.2)
Total Protein: 7.5 g/dL (ref 6.5–8.1)

## 2014-11-16 LAB — CBC
HCT: 44.2 % (ref 39.0–52.0)
Hemoglobin: 14.1 g/dL (ref 13.0–17.0)
MCH: 31.4 pg (ref 26.0–34.0)
MCHC: 31.9 g/dL (ref 30.0–36.0)
MCV: 98.4 fL (ref 78.0–100.0)
Platelets: 205 10*3/uL (ref 150–400)
RBC: 4.49 MIL/uL (ref 4.22–5.81)
RDW: 13.8 % (ref 11.5–15.5)
WBC: 12.2 10*3/uL — ABNORMAL HIGH (ref 4.0–10.5)

## 2014-11-16 LAB — RAPID URINE DRUG SCREEN, HOSP PERFORMED
Amphetamines: NOT DETECTED
Barbiturates: NOT DETECTED
Benzodiazepines: NOT DETECTED
Cocaine: NOT DETECTED
OPIATES: NOT DETECTED
Tetrahydrocannabinol: NOT DETECTED

## 2014-11-16 LAB — PROTIME-INR
INR: 1.85 — AB (ref 0.00–1.49)
INR: 2.21 — AB (ref 0.00–1.49)
Prothrombin Time: 21.3 seconds — ABNORMAL HIGH (ref 11.6–15.2)
Prothrombin Time: 24.3 seconds — ABNORMAL HIGH (ref 11.6–15.2)

## 2014-11-16 LAB — POCT I-STAT 3, ART BLOOD GAS (G3+)
Bicarbonate: 26.6 mEq/L — ABNORMAL HIGH (ref 20.0–24.0)
O2 SAT: 97 %
PH ART: 7.311 — AB (ref 7.350–7.450)
PO2 ART: 104 mmHg — AB (ref 80.0–100.0)
TCO2: 28 mmol/L (ref 0–100)
pCO2 arterial: 53.3 mmHg — ABNORMAL HIGH (ref 35.0–45.0)

## 2014-11-16 LAB — GLUCOSE, CAPILLARY
GLUCOSE-CAPILLARY: 100 mg/dL — AB (ref 65–99)
GLUCOSE-CAPILLARY: 115 mg/dL — AB (ref 65–99)

## 2014-11-16 LAB — TROPONIN I
TROPONIN I: 0.09 ng/mL — AB (ref <0.031)
Troponin I: 0.09 ng/mL — ABNORMAL HIGH (ref <0.031)
Troponin I: 0.1 ng/mL — ABNORMAL HIGH (ref <0.031)
Troponin I: 0.1 ng/mL — ABNORMAL HIGH (ref <0.031)

## 2014-11-16 LAB — MAGNESIUM: Magnesium: 1.7 mg/dL (ref 1.7–2.4)

## 2014-11-16 LAB — PROCALCITONIN
PROCALCITONIN: 28.55 ng/mL
Procalcitonin: 30.59 ng/mL

## 2014-11-16 LAB — HIV ANTIBODY (ROUTINE TESTING W REFLEX): HIV Screen 4th Generation wRfx: NONREACTIVE

## 2014-11-16 LAB — CG4 I-STAT (LACTIC ACID)
LACTIC ACID, VENOUS: 0.84 mmol/L (ref 0.5–2.0)
Lactic Acid, Venous: 1.62 mmol/L (ref 0.5–2.0)

## 2014-11-16 LAB — LACTIC ACID, PLASMA: Lactic Acid, Venous: 1.1 mmol/L (ref 0.5–2.0)

## 2014-11-16 LAB — BRAIN NATRIURETIC PEPTIDE: B NATRIURETIC PEPTIDE 5: 248.3 pg/mL — AB (ref 0.0–100.0)

## 2014-11-16 LAB — MRSA PCR SCREENING: MRSA by PCR: NEGATIVE

## 2014-11-16 MED ORDER — WARFARIN SODIUM 10 MG PO TABS
10.0000 mg | ORAL_TABLET | Freq: Once | ORAL | Status: AC
  Administered 2014-11-16: 10 mg via ORAL
  Filled 2014-11-16 (×2): qty 1

## 2014-11-16 MED ORDER — SODIUM CHLORIDE 0.9 % IV BOLUS (SEPSIS): 1000.0000 mL | Freq: Once | INTRAVENOUS | Status: DC

## 2014-11-16 MED ORDER — CHLORHEXIDINE GLUCONATE 0.12 % MT SOLN: 15.0000 mL | Freq: Two times a day (BID) | OROMUCOSAL | Status: DC

## 2014-11-16 MED ORDER — CETYLPYRIDINIUM CHLORIDE 0.05 % MT LIQD
7.0000 mL | Freq: Two times a day (BID) | OROMUCOSAL | Status: DC
  Administered 2014-11-16 – 2014-11-21 (×8): 7 mL via OROMUCOSAL

## 2014-11-16 MED ORDER — CETYLPYRIDINIUM CHLORIDE 0.05 % MT LIQD: 7.0000 mL | Freq: Two times a day (BID) | OROMUCOSAL | Status: DC

## 2014-11-16 MED ORDER — SODIUM CHLORIDE 0.9 % IV BOLUS (SEPSIS)
500.0000 mL | Freq: Once | INTRAVENOUS | Status: AC
  Administered 2014-11-16: 500 mL via INTRAVENOUS

## 2014-11-16 MED ORDER — WARFARIN SODIUM 10 MG PO TABS
10.0000 mg | ORAL_TABLET | Freq: Once | ORAL | Status: DC
  Filled 2014-11-16: qty 1

## 2014-11-16 MED ORDER — SODIUM CHLORIDE 0.9 % IV SOLN
INTRAVENOUS | Status: DC
  Administered 2014-11-16: 04:00:00 via INTRAVENOUS

## 2014-11-16 MED ORDER — FUROSEMIDE 10 MG/ML IJ SOLN
INTRAMUSCULAR | Status: AC
  Filled 2014-11-16: qty 4

## 2014-11-16 MED ORDER — SODIUM CHLORIDE 0.9 % IV BOLUS (SEPSIS): 300.0000 mL | Freq: Once | INTRAVENOUS | Status: DC

## 2014-11-16 MED ORDER — PHENYLEPHRINE HCL 10 MG/ML IJ SOLN
30.0000 ug/min | INTRAVENOUS | Status: DC
  Administered 2014-11-16 (×2): 30 ug/min via INTRAVENOUS
  Filled 2014-11-16 (×2): qty 1

## 2014-11-16 MED ORDER — FUROSEMIDE 10 MG/ML IJ SOLN
40.0000 mg | Freq: Once | INTRAMUSCULAR | Status: AC
  Administered 2014-11-16: 40 mg via INTRAVENOUS

## 2014-11-16 NOTE — Progress Notes (Signed)
eLink Physician-Brief Progress Note Patient Name: [name] DOB: [birth year] MRN: [id number]   Date of Service  11/16/2014  HPI/Events of Note  Patient c/o increased SOB. Patient is eating a diet and has IV fluid running at 50 mL/hour.  eICU Interventions  Will D/C IV fluid.        Isaac Lacson [name] 11/16/2014, 3:59 PM

## 2014-11-16 NOTE — Progress Notes (Signed)
RT called to assess patient need for BiPAP d/t increased work of breathing.

## 2014-11-16 NOTE — Progress Notes (Signed)
ANTICOAGULATION CONSULT NOTE - Follow Up Consult  Pharmacy Consult for coumadin Indication: afib/CVA  No Known Allergies  Vital Signs: Temp: 100.1 F (37.8 C) (07/15 0400) Temp Source: Oral (07/15 0400) BP: 93/51 mmHg (07/15 0815) Pulse Rate: 70 (07/15 0858)  Labs:  Recent Labs  11/14/14 11/15/14 1800 11/16/14 0253 11/16/14 0913  HGB  --  14.1  --  11.2*  HCT  --  44.2  --  34.6*  PLT  --  205  --  163  LABPROT  --  21.3*  --  24.3*  INR 2.1 1.85*  --  2.21*  CREATININE  --  1.45*  --  1.92*  TROPONINI  --  0.09* 0.09* 0.10*    Estimated Creatinine Clearance: 56.1 mL/min (by C-G formula based on Cr of 1.92).   Medical History: Past Medical History  Diagnosis Date  . Chronic systolic CHF (congestive heart failure)     a. Likely NICM (out of proportion to CAD). b. 2009 - EF 25-30% by echo, 01/2013: 15-20% by cath. c. s/p St. Jude ICD implantation 2007.  [name] Gout   . Hypokalemia   . HTN (hypertension)   . Lipoma   . Dyslipidemia   . CAD (coronary artery disease)     a. Initial nonobst by cath 2009. b. Cath 01/2013 in setting of VT storm: obstructive distal Cx disease (small and terminates in the AV groove, unlikely to cause significant ischemia or electrical instability), nonobstructive RCA disease, EF 15-20%.   . Atrial fibrillation     a. Noted 05/2008 by EKG.  . Paroxysmal VT     a. s/p St. Jude ICD 2007. b. H/o paroxysmal VT/VF including VT storm 12/2012 admission prompting amio initiation.  . Nonischemic cardiomyopathy   . ICD (implantable cardiac defibrillator) in place   . OSA (obstructive sleep apnea)     does not wear cpap  . DM (diabetes mellitus)     insulin dependent  . Cerebrovascular accident     a. 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 Pulmonary HTN     a. Mild by cath 01/2013.  . CKD (chronic kidney disease), stage II     Assessment: 59 yo male on coumadin PTA for history of CVA/afib. Pharmacy has been consulted to dose. INR today is therapeutic at  2.21  -Labs are not reporting to [facility] but a printed version was available  Home regimen: 10mg /day except take 5mg  on MWF (last seen in coumadin clinic 7/13; dose was also verified with patient and he has not taken his dose today).  Goal of Therapy:  INR 2-3 Monitor platelets by anticoagulation protocol: Yes   Plan:  -Coumadin 10 mg x 1 dose today. Likely can resume home dose tomorrow  -Daily PT/INR  Albertina Parr, PharmD., BCPS Clinical Pharmacist Pager [phone]

## 2014-11-16 NOTE — Progress Notes (Signed)
Pt. BP  Reading  In the 60's/30's. BP cuff moved to different arm and larger sized cuff used with no change in BP readings. Lamar Blinks, NP paged of patient's BP. Patient denies any dizziness or light headedness. States he only feels sleepy. Orders received for 565ml bolus. Will continue to monitor patient.

## 2014-11-16 NOTE — Progress Notes (Signed)
*  PRELIMINARY RESULTS* Echocardiogram 2D Echocardiogram has been performed.  Leavy Cella 11/16/2014, 9:24 AM

## 2014-11-16 NOTE — Progress Notes (Signed)
Utilization Review Completed.Donne Anon T7/15/2016

## 2014-11-16 NOTE — Progress Notes (Addendum)
PT Cancellation Note  Patient Details Name: [name] MRN: [id number] DOB: [birth year]   Cancelled Treatment:    Reason Eval/Treat Not Completed: Medical issues which prohibited therapy (pt currently on bedrest and will need updated activity to eval. Pt also moved from SDU to ICU with neo initiated. Will check back next date)   Melford Aase 11/16/2014, 7:23 AM Elwyn Reach, [place]

## 2014-11-16 NOTE — Progress Notes (Signed)
Pt. Placed on bipap due to labored breathing and desating.

## 2014-11-16 NOTE — Consult Note (Signed)
PULMONARY / CRITICAL CARE MEDICINE   Name: [name] MRN: [id number] DOB: Aug 27, 1955    ADMISSION DATE:  11/15/2014 CONSULTATION DATE:  7/15  REFERRING MD : triad  CHIEF COMPLAINT:  sob  INITIAL PRESENTATION: SOB for 24 hours  STUDIES:    SIGNIFICANT EVENTS: 7/15 tx to ICU for hypoxia /hypercarbia/hypotension   HISTORY OF PRESENT ILLNESS:   59 yo AAM with an extensive PMH that includes, CHF with EF 20 % and AICD, PAF on coumadin with INR 1.85, HTN, OSA(non compliant), MO(264 lbs) CKD  stage 2(creatine 1.4) who presented 7/14 with SOB, cough , chills x 24 and CxR comncernig for CAP. During the night required fluid(lasix in ED)for hypotension and NIMVS. Treated with neo drip for hypotension and he remains awake and alert but may need OTT/CVL before day is over.  PAST MEDICAL HISTORY :   has a past medical history of Chronic systolic CHF (congestive heart failure); Gout; Hypokalemia; HTN (hypertension); Lipoma; Dyslipidemia; CAD (coronary artery disease); Atrial fibrillation; Paroxysmal VT; Nonischemic cardiomyopathy; ICD (implantable cardiac defibrillator) in place; OSA (obstructive sleep apnea); DM (diabetes mellitus); Cerebrovascular accident; Pulmonary HTN; and CKD (chronic kidney disease), stage II.  has past surgical history that includes Cardiac catheterization; Cardiac defibrillator placement; Lipoma excision; left and right heart catheterization with coronary angiogram (N/A, 01/03/2013); and implantable cardioverter defibrillator (icd) generator change (N/A, 01/15/2014). Prior to Admission medications   Medication Sig Start Date End Date Taking? Authorizing Provider  allopurinol (ZYLOPRIM) 300 MG tablet TAKE 1 TABLET (300 MG TOTAL) BY MOUTH DAILY. 10/31/14   Biagio Borg, MD  amiodarone (PACERONE) 200 MG tablet TAKE 1 TABLET (200 MG TOTAL) BY MOUTH DAILY. 08/20/14   Minus Breeding, MD  amLODipine (NORVASC) 10 MG tablet Take 10 mg by mouth daily.    Historical Provider, MD   amLODipine (NORVASC) 10 MG tablet TAKE 1 TABLET BY MOUTH DAILY 05/18/14   Minus Breeding, MD  atorvastatin (LIPITOR) 80 MG tablet Take 1 tablet (80 mg total) by mouth daily. 09/25/14   Biagio Borg, MD  carvedilol (COREG) 25 MG tablet TAKE 2 TABLETS (50 MG TOTAL) BY MOUTH 2 (TWO) TIMES DAILY WITH A MEAL. 05/24/14   Minus Breeding, MD  furosemide (LASIX) 80 MG tablet TAKE 1 TABLET (80 MG TOTAL) BY MOUTH DAILY. 10/05/14   Minus Breeding, MD  glucose blood (ONE TOUCH TEST STRIPS) test strip 1 each by Other route See admin instructions. Check blood sugar once daily.    Historical Provider, MD  hydrALAZINE (APRESOLINE) 50 MG tablet TAKE 1 TABLET (50 MG TOTAL) BY MOUTH EVERY 8 (EIGHT) HOURS. 06/22/14   Minus Breeding, MD  Insulin Glargine (LANTUS SOLOSTAR) 100 UNIT/ML Solostar Pen Inject 10 Units into the skin at bedtime. 09/19/13   Biagio Borg, MD  Insulin Pen Needle (B-D ULTRAFINE III SHORT PEN) 31G X 8 MM MISC Use once daily with insulin.  Diagnosis 03/23/14   Biagio Borg, MD  isosorbide dinitrate (ISORDIL) 20 MG tablet TAKE 1 TABLET (20 MG TOTAL) BY MOUTH 3 (THREE) TIMES DAILY. 06/22/14   Minus Breeding, MD  KLOR-CON M20 20 MEQ tablet TAKE 2 TABLETS (40 MEQ TOTAL) BY MOUTH DAILY. 04/18/14   Minus Breeding, MD  metFORMIN (GLUCOPHAGE) 500 MG tablet Take 1 tablet (500 mg total) by mouth daily with breakfast. 03/26/14   Biagio Borg, MD  metFORMIN (GLUCOPHAGE) 500 MG tablet TAKE 1 TABLET (500 MG TOTAL) BY MOUTH 2 (TWO) TIMES DAILY. 11/12/14   Biagio Borg, MD  Multiple  Vitamin (MULTIVITAMIN) capsule Take 1 capsule by mouth daily.     Historical Provider, MD  nitroGLYCERIN (NITROSTAT) 0.4 MG SL tablet Place 1 tablet (0.4 mg total) under the tongue every 5 (five) minutes x 3 doses as needed for chest pain. 01/12/14   Minus Breeding, MD  ONE TOUCH LANCETS MISC 1 each by Other route See admin instructions. Check blood sugar once daily.    Historical Provider, MD  oxyCODONE-acetaminophen (PERCOCET/ROXICET) 5-325 MG  per tablet Take 1-2 tablets by mouth every 8 (eight) hours as needed for severe pain. 07/05/14   Glendell Docker, NP  ramipril (ALTACE) 10 MG capsule TAKE 1 CAPSULE (10 MG TOTAL) BY MOUTH DAILY. 08/14/14   Minus Breeding, MD  spironolactone (ALDACTONE) 25 MG tablet TAKE 1 TABLET (25 MG TOTAL) BY MOUTH DAILY. 05/02/14   Minus Breeding, MD  tiZANidine (ZANAFLEX) 4 MG tablet Take 4 mg by mouth every 6 (six) hours as needed for muscle spasms.     Historical Provider, MD  warfarin (COUMADIN) 5 MG tablet Take as directed by anticoagulation clinic 08/13/14   Biagio Borg, MD   No Known Allergies  FAMILY HISTORY:  has no family status information on file.  SOCIAL HISTORY:  reports that he quit smoking about 28 years ago. He has never used smokeless tobacco. He reports that he does not drink alcohol or use illicit drugs.  REVIEW OF SYSTEMS:  10 point review of system taken, please see HPI for positives and negatives.   SUBJECTIVE:   VITAL SIGNS: Temp:  [99.6 F (37.6 C)-100.4 F (38 C)] 100.1 F (37.8 C) (07/15 0400) Pulse Rate:  [52-146] 72 (07/15 0645) Resp:  [17-38] 25 (07/15 0645) BP: (57-177)/(24-85) 88/48 mmHg (07/15 0645) SpO2:  [84 %-100 %] 99 % (07/15 0645) FiO2 (%):  [50 %-100 %] 100 % (07/14 2226) Weight:  [264 lb 8.8 oz (120 kg)] 264 lb 8.8 oz (120 kg) (07/14 2300) HEMODYNAMICS:   VENTILATOR SETTINGS: Vent Mode:  [-] BIPAP FiO2 (%):  [50 %-100 %] 100 % Set Rate:  [14 bmp-15 bmp] 14 bmp Vt Set:  [16 mL] 16 mL PEEP:  [6 cmH20] 6 cmH20 Pressure Support:  [6 cmH20-8 cmH20] 6 cmH20 INTAKE / OUTPUT:  Intake/Output Summary (Last 24 hours) at 11/16/14 0724 Last data filed at 11/16/14 0600  Gross per 24 hour  Intake  342.5 ml  Output   1200 ml  Net -857.5 ml    PHYSICAL EXAMINATION: General:  MO HEENT:AAM able to communicate. Wants something to eat  Neuro:  Follows commands by MAEx$ HEENT:  No JVD, oral mucosa moist, tracks and follows Cardiovascular:  HSR RRR HSD Lungs:   Decreased bs, bibasilar crackles Abdomen:  Obese + bs Musculoskeletal: intact Skin:  Warm and dry , faint le edema  LABS:  CBC  Recent Labs Lab 11/15/14 1800  WBC 12.2*  HGB 14.1  HCT 44.2  PLT 205   Coag's  Recent Labs Lab 11/14/14 11/15/14 1800  INR 2.1 1.85*   BMET  Recent Labs Lab 11/15/14 1800  NA 140  K 4.4  CL 105  CO2 24  BUN 16  CREATININE 1.45*  GLUCOSE 123*   Electrolytes  Recent Labs Lab 11/15/14 1800  CALCIUM 9.1   Sepsis Markers  Recent Labs Lab 11/15/14 1956 11/15/14 2150 11/16/14 0253  LATICACIDVEN 1.62 0.84 1.1   ABG  Recent Labs Lab 11/15/14 2351  PHART 7.311*  PCO2ART 53.3*  PO2ART 104.0*   Liver Enzymes  Recent Labs Lab  11/15/14 1800  AST 34  ALT 27  ALKPHOS 71  BILITOT 1.2  ALBUMIN 3.9   Cardiac Enzymes  Recent Labs Lab 11/15/14 1800 11/16/14 0253  TROPONINI 0.09* 0.09*   Glucose  Recent Labs Lab 11/15/14 2317  GLUCAP 100*    Imaging Dg Chest Port 1 View  11/15/2014   CLINICAL DATA:  Shortness of breath, on BiPAP  EXAM: PORTABLE CHEST - 1 VIEW  COMPARISON:  12/29/2012  FINDINGS: Multifocal patchy opacities in the right lung and possibly in the left suprahilar region. This appearance favors multifocal pneumonia, less likely interstitial edema.  No pleural effusion or pneumothorax.  Cardiomegaly.  Left subclavian ICD.  IMPRESSION: Multifocal patchy opacities, right lung predominant, suspicious for multifocal pneumonia.  Interstitial edema is considered less likely.   Electronically Signed   By: Julian Hy M.D.   On: 11/15/2014 18:19     ASSESSMENT / PLAN:  PULMONARY OETT A: Hypoxic/hypercarbic resp failure OSA un treated CAP by history and CxR P:   O2 as needed NIMVS as needed Monitor closely for hypoxemia, will likely require intubation if O2 demand continue so rise. Abx per ID section  CARDIOVASCULAR CVL A:  CHF with  diastolic dysfunction with EF 20 and AICD HTN PAF on  coumadin Hypotension from presumed sepsis vs over diuresis  P:  Hold diuresis  Consider cvl for cvp monitoring if remains on pressors Fluid resuscitation Low dose pressors as needed, increasing to 30 mcg of neo, if at 50 then will place TLC.  RENAL A:   CKD stage 2 base creat 1.4 P:   Follow creatine. BMET in AM. Replace electrolytes as indicated.  GASTROINTESTINAL A:   GI protection P:   PPI Diet as ordred.  HEMATOLOGIC A:   No acute issue P:  CBC in AM. Transfuse per ICU protocol.  INFECTIOUS A:   Presumed CAP P:   BCx2 7/14>> UC 7/14>> Sputum 7/14>> Abx:  7/14 roc>> 7/14 zithro>>  ENDOCRINE A:   DM P:   SSI  NEUROLOGIC A:   Hx of Stroke Rass 1 currently P:   RASS goal: 1 Careful with any sedation if not intubated   FAMILY  - Updates: Patient updated bedside.   TODAY'S SUMMARY:  59 yo AAM with an extensive PMH that includes, CHF with EF 20 % and AICD, PAF on coumadin with INR 1.85, HTN, OSA(non compliant), MO(264 lbs) CKD  stage 2(creatine 1.4) who presented 7/14 with SOB, cough , chills x 24 and CxR comncernig for CAP. During the night required fluid(lasix in ED)for hypotension and NIMVS. Treated with neo drip for hypotension and he remains awake and alert but may need OTT/CVL before day is over.  Richardson Landry Minor ACNP Maryanna Shape PCCM Pager [phone] till 3 pm If no answer page [phone]  Above note edited in full, titrate O2 down, will titrate pressors as able, hold off intubation for now, will re-evaluate later this afternoon, if BiPAP is required again then will intubate.  Continue treatment with abx as above.  Crackles appreciated in the RLL.  The patient is critically ill with multiple organ systems failure and requires high complexity decision making for assessment and support, frequent evaluation and titration of therapies, application of advanced monitoring technologies and extensive interpretation of multiple databases.   Critical Care Time  devoted to patient care services described in this note is  35  Minutes. This time reflects time of care of this signee Dr Jennet Maduro. This critical care time does not reflect  procedure time, or teaching time or supervisory time of PA/NP/Med student/Med Resident etc but could involve care discussion time.  Rush Farmer, M.D. [facility] Pulmonary/Critical Care Medicine. Pager: [phone]. After hours pager: [phone].  11/16/2014, 8:51 AM

## 2014-11-16 NOTE — Progress Notes (Signed)
Placed pt. On bipap, due to pt. Breathing becoming increasingly more labored. RT to monitor.

## 2014-11-16 NOTE — Progress Notes (Signed)
[facility] Progress Note Patient Name: [name] DOB: [birth year] MRN: [id number]   Date of Service  11/16/2014  HPI/Events of Note  89 M with reduced EF of 20% admitted with PNA now with ongoing hypotension despite 2.8 liters of NS.  Current BP of 85/52 (58), sats are 99% with RR Of 20.  Has a normal lactate.  WBC is elevated at 12.2.  eICU Interventions  Plan: To be placed on NEO for BP support. To be seen by PCCM in AM     Intervention Category Intermediate Interventions: Hypotension - evaluation and management  Yaqueline Gutter 11/16/2014, 6:17 AM

## 2014-11-16 NOTE — Progress Notes (Signed)
Patient's BP still soft in 80's/50's, NP notified. Orders for second bolus received.

## 2014-11-16 NOTE — Progress Notes (Signed)
OT Cancellation Note  Patient Details Name: [name] MRN: [id number] DOB: 01/31/1956   Cancelled Treatment:    Reason Eval/Treat Not Completed: Medical issues which prohibited therapy. Pt with hypotension, on neo with continued soft BP.  RN requesting try back later.  Will attempt at schedule allows.    Malka So 11/16/2014, 10:03 AM  [phone]

## 2014-11-17 ENCOUNTER — Inpatient Hospital Stay (HOSPITAL_COMMUNITY): Payer: Medicare Other

## 2014-11-17 DIAGNOSIS — Z95810 Presence of automatic (implantable) cardiac defibrillator: Secondary | ICD-10-CM

## 2014-11-17 DIAGNOSIS — I5042 Chronic combined systolic (congestive) and diastolic (congestive) heart failure: Secondary | ICD-10-CM

## 2014-11-17 DIAGNOSIS — R6521 Severe sepsis with septic shock: Secondary | ICD-10-CM

## 2014-11-17 DIAGNOSIS — Z7901 Long term (current) use of anticoagulants: Secondary | ICD-10-CM

## 2014-11-17 DIAGNOSIS — J189 Pneumonia, unspecified organism: Secondary | ICD-10-CM

## 2014-11-17 DIAGNOSIS — I429 Cardiomyopathy, unspecified: Secondary | ICD-10-CM

## 2014-11-17 DIAGNOSIS — R7989 Other specified abnormal findings of blood chemistry: Secondary | ICD-10-CM

## 2014-11-17 DIAGNOSIS — I480 Paroxysmal atrial fibrillation: Secondary | ICD-10-CM

## 2014-11-17 DIAGNOSIS — A419 Sepsis, unspecified organism: Principal | ICD-10-CM

## 2014-11-17 DIAGNOSIS — I10 Essential (primary) hypertension: Secondary | ICD-10-CM

## 2014-11-17 LAB — PROCALCITONIN: PROCALCITONIN: 24.57 ng/mL

## 2014-11-17 LAB — GLUCOSE, CAPILLARY
GLUCOSE-CAPILLARY: 132 mg/dL — AB (ref 65–99)
GLUCOSE-CAPILLARY: 101 mg/dL — AB (ref 65–99)
Glucose-Capillary: 98 mg/dL (ref 65–99)
Glucose-Capillary: 145 mg/dL — ABNORMAL HIGH (ref 65–99)
Glucose-Capillary: 117 mg/dL — ABNORMAL HIGH (ref 65–99)
Glucose-Capillary: 194 mg/dL — ABNORMAL HIGH (ref 65–99)

## 2014-11-17 LAB — STREP PNEUMONIAE URINARY ANTIGEN: STREP PNEUMO URINARY ANTIGEN: NEGATIVE

## 2014-11-17 LAB — PROTIME-INR
INR: 2.35 — AB (ref 0.00–1.49)
PROTHROMBIN TIME: 25.4 s — AB (ref 11.6–15.2)

## 2014-11-17 LAB — CBC
HEMATOCRIT: 37.1 % — AB (ref 39.0–52.0)
Hemoglobin: 11.7 g/dL — ABNORMAL LOW (ref 13.0–17.0)
MCH: 31 pg (ref 26.0–34.0)
MCHC: 31.5 g/dL (ref 30.0–36.0)
MCV: 98.4 fL (ref 78.0–100.0)
Platelets: 138 10*3/uL — ABNORMAL LOW (ref 150–400)
RBC: 3.77 MIL/uL — ABNORMAL LOW (ref 4.22–5.81)
RDW: 13.8 % (ref 11.5–15.5)
WBC: 15.8 10*3/uL — AB (ref 4.0–10.5)

## 2014-11-17 LAB — POCT I-STAT 3, ART BLOOD GAS (G3+)
ACID-BASE DEFICIT: 1 mmol/L (ref 0.0–2.0)
BICARBONATE: 24.1 meq/L — AB (ref 20.0–24.0)
O2 Saturation: 90 %
PH ART: 7.354 (ref 7.350–7.450)
TCO2: 25 mmol/L (ref 0–100)
pCO2 arterial: 43.3 mmHg (ref 35.0–45.0)
pO2, Arterial: 62 mmHg — ABNORMAL LOW (ref 80.0–100.0)

## 2014-11-17 LAB — BASIC METABOLIC PANEL
Anion gap: 6 (ref 5–15)
BUN: 23 mg/dL — ABNORMAL HIGH (ref 6–20)
CALCIUM: 8.3 mg/dL — AB (ref 8.9–10.3)
CO2: 26 mmol/L (ref 22–32)
CREATININE: 1.9 mg/dL — AB (ref 0.61–1.24)
Chloride: 106 mmol/L (ref 101–111)
GFR calc non Af Amer: 37 mL/min — ABNORMAL LOW (ref >60)
GFR, EST AFRICAN AMERICAN: 43 mL/min — AB (ref >60)
Glucose, Bld: 109 mg/dL — ABNORMAL HIGH (ref 65–99)
Potassium: 3.9 mmol/L (ref 3.5–5.1)
Sodium: 138 mmol/L (ref 135–145)

## 2014-11-17 LAB — MAGNESIUM: Magnesium: 1.9 mg/dL (ref 1.7–2.4)

## 2014-11-17 LAB — PHOSPHORUS: Phosphorus: 2.8 mg/dL (ref 2.5–4.6)

## 2014-11-17 LAB — LACTIC ACID, PLASMA: Lactic Acid, Venous: 0.9 mmol/L (ref 0.5–2.0)

## 2014-11-17 MED ORDER — FUROSEMIDE 10 MG/ML IJ SOLN
40.0000 mg | Freq: Two times a day (BID) | INTRAMUSCULAR | Status: DC
  Administered 2014-11-17 – 2014-11-20 (×6): 40 mg via INTRAVENOUS
  Filled 2014-11-17 (×10): qty 4

## 2014-11-17 MED ORDER — WARFARIN SODIUM 10 MG PO TABS
10.0000 mg | ORAL_TABLET | Freq: Once | ORAL | Status: AC
  Administered 2014-11-17: 10 mg via ORAL
  Filled 2014-11-17 (×2): qty 1

## 2014-11-17 MED ORDER — ACETAMINOPHEN 325 MG PO TABS
650.0000 mg | ORAL_TABLET | Freq: Four times a day (QID) | ORAL | Status: DC | PRN
  Administered 2014-11-17 – 2014-11-19 (×2): 650 mg via ORAL
  Filled 2014-11-17: qty 2

## 2014-11-17 NOTE — Progress Notes (Signed)
eLink Physician-Brief Progress Note Patient Name: [name] DOB: [birth year] MRN: [id number]   Date of Service  11/17/2014  HPI/Events of Note  Fever to 102.4 F. Already on Rocephin and Azithromycin.  eICU Interventions  Will order: 1. Tylenol 650 mg PO PRN. 2. Blood Cultures X 2.      Intervention Category Major Interventions: Infection - evaluation and management  Jadelynn Boylan Eugene 11/17/2014, 7:41 PM

## 2014-11-17 NOTE — Evaluation (Signed)
Physical Therapy Evaluation Patient Details Name: [name] MRN: [id number] DOB: [birth year] Today's Date: 11/17/2014   History of Present Illness  59 yo male former smoker with acute hypoxic/hypercapnic respiratory failure from CAP and pulmonary edema.  Clinical Impression  Pt adm due to above. Pt presents with deficits indicated below (see PT problem list). Pt to benefit from skilled acute PT to address deficits and maximize functional mobility prior to returning home with family.     Follow Up Recommendations Home health PT;Supervision/Assistance - 24 hour    Equipment Recommendations  None recommended by PT    Recommendations for Other Services       Precautions / Restrictions Precautions Precautions: Fall Precaution Comments: monitor O2 sats Restrictions Weight Bearing Restrictions: No      Mobility  Bed Mobility Overal bed mobility: Modified Independent                Transfers Overall transfer level: Needs assistance Equipment used: 1 person hand held assist Transfers: Sit to/from Stand Sit to Stand: Supervision         General transfer comment: supervision for safety  Ambulation/Gait Ambulation/Gait assistance: Min guard Ambulation Distance (Feet): 15 Feet Assistive device: 1 person hand held assist Gait Pattern/deviations: Step-through pattern;Decreased stride length Gait velocity: decr Gait velocity interpretation: Below normal speed for age/gender General Gait Details: min guard to steady; VSS during ambulation; hand held (A) provided to steady during gait   Stairs            Wheelchair Mobility    Modified Rankin (Stroke Patients Only)       Balance Overall balance assessment: Needs assistance Sitting-balance support: Feet supported;No upper extremity supported Sitting balance-Leahy Scale: Good     Standing balance support: During functional activity;No upper extremity supported Standing balance-Leahy Scale: Fair                                Pertinent Vitals/Pain Pain Assessment: No/denies pain    Home Living Family/patient expects to be discharged to:: Private residence Living Arrangements: Spouse/significant other Available Help at Discharge: Family;Available 24 hours/day Type of Home: House Home Access: Stairs to enter Entrance Stairs-Rails: None Entrance Stairs-Number of Steps: 2 Home Layout: One level Home Equipment: None      Prior Function Level of Independence: Independent               Hand Dominance        Extremity/Trunk Assessment   Upper Extremity Assessment: Defer to OT evaluation           Lower Extremity Assessment: Generalized weakness      Cervical / Trunk Assessment: Normal  Communication   Communication: No difficulties  Cognition Arousal/Alertness: Awake/alert Behavior During Therapy: WFL for tasks assessed/performed Overall Cognitive Status: Within Functional Limits for tasks assessed                      General Comments General comments (skin integrity, edema, etc.): all VSS during evaluation     Exercises        Assessment/Plan    PT Assessment Patient needs continued PT services  PT Diagnosis Generalized weakness;Difficulty walking   PT Problem List Decreased strength;Decreased activity tolerance;Decreased balance;Decreased mobility;Cardiopulmonary status limiting activity  PT Treatment Interventions DME instruction;Gait training;Stair training;Functional mobility training;Therapeutic activities;Therapeutic exercise;Balance training;Neuromuscular re-education;Patient/family education   PT Goals (Current goals can be found in the Care Plan section) Acute Rehab PT  Goals Patient Stated Goal: to go home PT Goal Formulation: With patient Time For Goal Achievement: 12/01/14 Potential to Achieve Goals: Good    Frequency Min 3X/week   Barriers to discharge        Co-evaluation               End of Session  Equipment Utilized During Treatment: Gait belt;Oxygen Activity Tolerance: Patient tolerated treatment well Patient left: in chair;with call bell/phone within reach Nurse Communication: Mobility status         Time: 1130-1144 PT Time Calculation (min) (ACUTE ONLY): 14 min   Charges:   PT Evaluation $Initial PT Evaluation Tier I: 1 Procedure     PT G CodesGustavus Bryant PT  [id number] 11/17/2014, 12:30 PM

## 2014-11-17 NOTE — Consult Note (Signed)
PULMONARY / CRITICAL CARE MEDICINE   Name: [name] MRN: [id number] DOB: 1955/06/05    ADMISSION DATE:  11/15/2014 CONSULTATION DATE:  11/16/2014  REFERRING MD : triad  CHIEF COMPLAINT:  sob  INITIAL PRESENTATION:  59 yo male former smoker with acute hypoxic/hypercapnic respiratory failure from CAP and pulmonary edema.  STUDIES:  7/15 Echo >> EF 35 to [id number], grade 1 diastolic dysfx, PAS 42 mmHg  SIGNIFICANT EVENTS: 7/15 tx to ICU for hypoxia /hypercarbia/hypotension  SUBJECTIVE:  Breathing better.  Denies chest pain.  Winded/hypoxic with activity.  VITAL SIGNS: Temp:  [98 F (36.7 C)-100.9 F (38.3 C)] 98.8 F (37.1 C) (07/16 0733) Pulse Rate:  [76-112] 89 (07/16 0900) Resp:  [15-37] 26 (07/16 0900) BP: (86-160)/(35-131) 104/47 mmHg (07/16 0900) SpO2:  [82 %-100 %] 96 % (07/16 0900) INTAKE / OUTPUT:  Intake/Output Summary (Last 24 hours) at 11/17/14 0957 Last data filed at 11/17/14 0800  Gross per 24 hour  Intake   1255 ml  Output   2750 ml  Net  -1495 ml    PHYSICAL EXAMINATION: General: sleepy HEENT: no sinus tenderness Neuro: normal strength Cardiovascular: regular Lungs: basilar crackles Abdomen: soft, non tender Musculoskeletal:1+ edema Skin: no rashes  LABS:  CBC  Recent Labs Lab 11/15/14 1800 11/16/14 0913 11/17/14 0235  WBC 12.2* 18.5* 15.8*  HGB 14.1 11.2* 11.7*  HCT 44.2 34.6* 37.1*  PLT 205 163 138*   Coag's  Recent Labs Lab 11/15/14 1800 11/16/14 0913 11/17/14 0235  INR 1.85* 2.21* 2.35*   BMET  Recent Labs Lab 11/15/14 1800 11/16/14 0913 11/17/14 0235  NA 140 140 138  K 4.4 3.9 3.9  CL 105 109 106  CO2 24 25 26   BUN 16 23* 23*  CREATININE 1.45* 1.92* 1.90*  GLUCOSE 123* 97 109*   Electrolytes  Recent Labs Lab 11/15/14 1800 11/16/14 0913 11/17/14 0235  CALCIUM 9.1 8.2* 8.3*  MG  --  1.7 1.9  PHOS  --   --  2.8   Sepsis Markers  Recent Labs Lab 11/15/14 2150 11/16/14 0253 11/16/14 0913  11/16/14 1016 11/17/14 0230 11/17/14 0235  LATICACIDVEN 0.84 1.1  --   --  0.9  --   PROCALCITON  --   --  30.59 28.55  --  24.57   ABG  Recent Labs Lab 11/15/14 2351 11/17/14 0315  PHART 7.311* 7.354  PCO2ART 53.3* 43.3  PO2ART 104.0* 62.0*   Liver Enzymes  Recent Labs Lab 11/15/14 1800  AST 34  ALT 27  ALKPHOS 71  BILITOT 1.2  ALBUMIN 3.9   Cardiac Enzymes  Recent Labs Lab 11/16/14 0253 11/16/14 0913 11/16/14 1016  TROPONINI 0.09* 0.10* 0.10*   Glucose  Recent Labs Lab 11/15/14 2317 11/16/14 0750 11/16/14 1123 11/16/14 1727 11/16/14 2150 11/17/14 0732  GLUCAP 100* 98 132* 145* 115* 117*    Imaging Dg Chest Port 1 View  11/17/2014   CLINICAL DATA:  Respiratory failure.  EXAM: PORTABLE CHEST - 1 VIEW  COMPARISON:  11/16/2014  FINDINGS: Left-sided AICD lead to the right ventricle. The heart is enlarged. There are dense bilateral pulmonary opacities, increased since the previous exam.  IMPRESSION: Increased opacity of bilateral pulmonary infiltrates.   Electronically Signed   By: Nolon Nations M.D.   On: 11/17/2014 07:23   Cultures: Blood 7/14 >> Urine 7/15 >> Legionella Ag 7/16 >> Pneumococcal Ag 7/16 >>   ASSESSMENT / PLAN:  Acute on chronic hypoxic/hypercapnic respiratory failure 2nd to CAP and acute pulmonary edema. OSA/OHS.  Secondary pulmonary HTN. Plan: - oxygen to keep SpO2 90 to 95% - CPAP qhs - f/u CXR - goal even fluid balance  Acute on chronic combined heart failure. Hx of A fib, CAD, HTN, HLD, CVA. S/p AICD. Plan: - consult cardiology - continue amiodarone, lipitor, coreg, isordil - hold norvasc, altace, aldactone - coumadin per pharmacy  Stage 2 CKD >> baseline creatinine 1.4. Plan: - monitor renal fx, urine outpt  CAP >> procalcitonin 30.59 from 7/15. Plan: - Day 3 rocephin, zithromax  DM type II. Hx of gout. Plan: - SSI - hold metformin for now - allopurinol  Muscle spasms. Plan: - continue  zanaflex  Change to SDU status.  Keep on PCCM service.  Chesley Mires, MD [facility] Pulmonary/Critical Care 11/17/2014, 10:09 AM Pager:  [phone] After 3pm call: [phone]

## 2014-11-17 NOTE — Consult Note (Signed)
CONSULTATION NOTE  Reason for Consult: Cardiomyopathy, congestive heart failure  Requesting Physician: Dr. Alva Garnet  Cardiologist: Dr. Percival Spanish (general cardiologist) Dr. Lovena Le (EP)  HPI: This is a 59 y.o. 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 LVEF as low as 15% in the past, recently up to 25-30% in 01/2013. He is s/p St. Jude single-chamber ICD (2007) which is followed by Dr. Cristopher Peru. He now presents with cough and progressive shortness of breath. He reported chills but no fever. He was found to have infiltrates on CXR with elevated WBC count. Tmax was 100.4. Troponin was mildly elevated on admission to 0.09, and has remained flat at 0.10, consistent with probable heart failure. INR remains therapeutic. Repeat echo was performed yesterday which shows LVEF has mildly improved to 35-40%, there is a persistent small pericardial effusion. Cardiology is asked to provide recommendations for CHF management.  PMHx:  Past Medical History  Diagnosis Date  . Chronic systolic CHF (congestive heart failure)     a. Likely NICM (out of proportion to CAD). b. 2009 - EF 25-30% by echo, 01/2013: 15-20% by cath. c. s/p St. Jude ICD implantation 2007.  [name] Gout   . Hypokalemia   . HTN (hypertension)   . Lipoma   . Dyslipidemia   . CAD (coronary artery disease)     a. Initial nonobst by cath 2009. b. Cath 01/2013 in setting of VT storm: obstructive distal Cx disease (small and terminates in the AV groove, unlikely to cause significant ischemia or electrical instability), nonobstructive RCA disease, EF 15-20%.   . Atrial fibrillation     a. Noted 05/2008 by EKG.  . Paroxysmal VT     a. s/p St. Jude ICD 2007. b. H/o paroxysmal VT/VF including VT storm 12/2012 admission prompting amio initiation.  .  Nonischemic cardiomyopathy   . ICD (implantable cardiac defibrillator) in place   . OSA (obstructive sleep apnea)     does not wear cpap  . DM (diabetes mellitus)     insulin dependent  . Cerebrovascular accident     a. 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 Pulmonary HTN     a. Mild by cath 01/2013.  . CKD (chronic kidney disease), stage II    Past Surgical History  Procedure Laterality Date  . Cardiac catheterization      Nonobstructive coronary disease 2009  . Cardiac defibrillator placement      ICD-St. Jude  . Lipoma excision    . Left and right heart catheterization with coronary angiogram N/A 01/03/2013    Procedure: LEFT AND RIGHT HEART CATHETERIZATION WITH CORONARY ANGIOGRAM;  Surgeon: Peter M Martinique, MD;  Location: [facility] CATH LAB;  Service: Cardiovascular;  Laterality: N/A;  . Implantable cardioverter defibrillator (icd) generator change N/A 01/15/2014    Procedure: ICD GENERATOR CHANGE;  Surgeon: Evans Lance, MD;  Location: [facility] CATH LAB;  Service: Cardiovascular;  Laterality: N/A;    FAMHx: Family History  Problem Relation Age of Onset  . Coronary artery disease Mother     SOCHx:  reports that he quit smoking about 28 years ago. He has never used smokeless tobacco. He reports that he does not drink alcohol or use illicit drugs.  ALLERGIES: No Known Allergies  ROS: A comprehensive review of systems was negative except for: Constitutional: positive for chills, fevers and malaise Respiratory: positive for cough  and dyspnea on exertion Cardiovascular: positive for orthopnea  HOME MEDICATIONS: Prescriptions prior to admission  Medication Sig Dispense Refill Last Dose  . allopurinol (ZYLOPRIM) 300 MG tablet TAKE 1 TABLET (300 MG TOTAL) BY MOUTH DAILY. 90 tablet 1 11/15/2014 at Unknown time  . amiodarone (PACERONE) 200 MG tablet TAKE 1 TABLET (200 MG TOTAL) BY MOUTH DAILY. (Patient taking differently: TAKE 1 TABLET (200 MG TOTAL) BY MOUTH DAILY AT BEDTIME) 90 tablet 1  11/15/2014 at Unknown time  . amLODipine (NORVASC) 10 MG tablet TAKE 1 TABLET BY MOUTH DAILY 90 tablet 3 11/15/2014 at Unknown time  . atorvastatin (LIPITOR) 80 MG tablet Take 1 tablet (80 mg total) by mouth daily. (Patient taking differently: Take 80 mg by mouth daily at 6 PM. ) 90 tablet 3 11/15/2014 at Unknown time  . carvedilol (COREG) 25 MG tablet TAKE 2 TABLETS (50 MG TOTAL) BY MOUTH 2 (TWO) TIMES DAILY WITH A MEAL. 360 tablet 3 11/15/2014 at 0800  . furosemide (LASIX) 80 MG tablet TAKE 1 TABLET (80 MG TOTAL) BY MOUTH DAILY. 90 tablet 4 11/15/2014 at Unknown time  . hydrALAZINE (APRESOLINE) 50 MG tablet TAKE 1 TABLET (50 MG TOTAL) BY MOUTH EVERY 8 (EIGHT) HOURS. 270 tablet 1 11/15/2014 at Unknown time  . Insulin Glargine (LANTUS SOLOSTAR) 100 UNIT/ML Solostar Pen Inject 10 Units into the skin at bedtime. 30 mL 11 11/15/2014 at Unknown time  . isosorbide dinitrate (ISORDIL) 20 MG tablet TAKE 1 TABLET (20 MG TOTAL) BY MOUTH 3 (THREE) TIMES DAILY. 270 tablet 1 11/15/2014 at Unknown time  . KLOR-CON M20 20 MEQ tablet TAKE 2 TABLETS (40 MEQ TOTAL) BY MOUTH DAILY. 180 tablet 3 11/15/2014 at Unknown time  . metFORMIN (GLUCOPHAGE) 500 MG tablet Take 1 tablet (500 mg total) by mouth daily with breakfast. 90 tablet 3 11/15/2014 at Unknown time  . Multiple Vitamin (MULTIVITAMIN) capsule Take 1 capsule by mouth daily.    11/15/2014 at Unknown time  . nitroGLYCERIN (NITROSTAT) 0.4 MG SL tablet Place 1 tablet (0.4 mg total) under the tongue every 5 (five) minutes x 3 doses as needed for chest pain. 25 tablet 2 11/15/2014 at Unknown time  . ramipril (ALTACE) 10 MG capsule TAKE 1 CAPSULE (10 MG TOTAL) BY MOUTH DAILY. 90 capsule 0 11/15/2014 at Unknown time  . spironolactone (ALDACTONE) 25 MG tablet TAKE 1 TABLET (25 MG TOTAL) BY MOUTH DAILY. 90 tablet 3 11/15/2014 at Unknown time  . warfarin (COUMADIN) 5 MG tablet Take as directed by anticoagulation clinic (Patient taking differently: Take 5-10 mg by mouth at bedtime. TAKES  5MG ON MON, WED AND FRI  TAKES 10MG ALL OTHER DAYS) 180 tablet 1 11/15/2014 at Unknown time    HOSPITAL MEDICATIONS: I have reviewed the patient's current medications.  VITALS: Blood pressure 128/78, pulse 79, temperature 99.7 F (37.6 C), temperature source Oral, resp. rate 10, height 6' (1.829 m), weight 264 lb 8.8 oz (120 kg), SpO2 94 %.  PHYSICAL EXAM: General appearance: alert, moderate distress and on NRB mask Neck: JVD - 7 cm above sternal notch and no carotid bruit Lungs: diminished breath sounds bibasilar and rales RLL Heart: regular rate and rhythm, S1, S2 normal, no murmur, click, rub or gallop Abdomen: obese, soft, non-tender Extremities: extremities normal, atraumatic, no cyanosis or edema Pulses: 2+ and symmetric Skin: Skin color, texture, turgor normal. No rashes or lesions Neurologic: Mental status: Alert, oriented, thought content appropriate Psych: Pleasant  LABS: Results for orders placed or performed during the hospital encounter of  11/15/14 (from the past 48 hour(s))  CBC     Status: Abnormal   Collection Time: 11/15/14  6:00 PM  Result Value Ref Range   WBC 12.2 (H) 4.0 - 10.5 K/uL   RBC 4.49 4.22 - 5.81 MIL/uL   Hemoglobin 14.1 13.0 - 17.0 g/dL   HCT 44.2 39.0 - 52.0 %   MCV 98.4 78.0 - 100.0 fL   MCH 31.4 26.0 - 34.0 pg   MCHC 31.9 30.0 - 36.0 g/dL   RDW 13.8 11.5 - 15.5 %   Platelets 205 150 - 400 K/uL  Comprehensive metabolic panel     Status: Abnormal   Collection Time: 11/15/14  6:00 PM  Result Value Ref Range   Sodium 140 135 - 145 mmol/L   Potassium 4.4 3.5 - 5.1 mmol/L   Chloride 105 101 - 111 mmol/L   CO2 24 22 - 32 mmol/L   Glucose, Bld 123 (H) 65 - 99 mg/dL   BUN 16 6 - 20 mg/dL   Creatinine, Ser 1.45 (H) 0.61 - 1.24 mg/dL   Calcium 9.1 8.9 - 10.3 mg/dL   Total Protein 7.5 6.5 - 8.1 g/dL   Albumin 3.9 3.5 - 5.0 g/dL   AST 34 15 - 41 U/L   ALT 27 17 - 63 U/L   Alkaline Phosphatase 71 38 - 126 U/L   Total Bilirubin 1.2 0.3 - 1.2  mg/dL   GFR calc non Af Amer 52 (L) >60 mL/min   GFR calc Af Amer 60 (L) >60 mL/min    Comment: (NOTE) The eGFR has been calculated using the CKD EPI equation. This calculation has not been validated in all clinical situations. eGFR's persistently <60 mL/min signify possible Chronic Kidney Disease.    Anion gap 11 5 - 15  Protime-INR     Status: Abnormal   Collection Time: 11/15/14  6:00 PM  Result Value Ref Range   Prothrombin Time 21.3 (H) 11.6 - 15.2 seconds   INR 1.85 (H) 0.00 - 1.49  Troponin I     Status: Abnormal   Collection Time: 11/15/14  6:00 PM  Result Value Ref Range   Troponin I 0.09 (H) <0.031 ng/mL    Comment:        PERSISTENTLY INCREASED TROPONIN VALUES IN THE RANGE OF 0.04-0.49 ng/mL CAN BE SEEN IN:       -UNSTABLE ANGINA       -CONGESTIVE HEART FAILURE       -MYOCARDITIS       -CHEST TRAUMA       -ARRYHTHMIAS       -LATE PRESENTING MYOCARDIAL INFARCTION       -COPD   CLINICAL FOLLOW-UP RECOMMENDED.   Brain natriuretic peptide     Status: Abnormal   Collection Time: 11/15/14  6:00 PM  Result Value Ref Range   B Natriuretic Peptide 248.3 (H) 0.0 - 100.0 pg/mL  Culture, blood (routine x 2)     Status: None (Preliminary result)   Collection Time: 11/15/14  7:02 PM  Result Value Ref Range   Specimen Description BLOOD RIGHT ARM    Special Requests BOTTLES DRAWN AEROBIC AND ANAEROBIC 10CC EACH    Culture NO GROWTH 2 DAYS    Report Status PENDING   Culture, blood (routine x 2)     Status: None (Preliminary result)   Collection Time: 11/15/14  7:50 PM  Result Value Ref Range   Specimen Description BLOOD RIGHT ARM    Special Requests BOTTLES DRAWN AEROBIC  AND ANAEROBIC 5CC EACH    Culture NO GROWTH 2 DAYS    Report Status PENDING   CG4 I-STAT (Lactic acid)     Status: None   Collection Time: 11/15/14  7:56 PM  Result Value Ref Range   Lactic Acid, Venous 1.62 0.5 - 2.0 mmol/L  CG4 I-STAT (Lactic acid)     Status: None   Collection Time: 11/15/14  9:50  PM  Result Value Ref Range   Lactic Acid, Venous 0.84 0.5 - 2.0 mmol/L  MRSA PCR Screening     Status: None   Collection Time: 11/15/14 10:54 PM  Result Value Ref Range   MRSA by PCR NEGATIVE NEGATIVE    Comment:        The GeneXpert MRSA Assay (FDA approved for NASAL specimens only), is one component of a comprehensive MRSA colonization surveillance program. It is not intended to diagnose MRSA infection nor to guide or monitor treatment for MRSA infections.   Glucose, capillary     Status: Abnormal   Collection Time: 11/15/14 11:17 PM  Result Value Ref Range   Glucose-Capillary 100 (H) 65 - 99 mg/dL   Comment 1 Capillary Specimen   I-STAT 3, arterial blood gas (G3+)     Status: Abnormal   Collection Time: 11/15/14 11:51 PM  Result Value Ref Range   pH, Arterial 7.311 (L) 7.350 - 7.450   pCO2 arterial 53.3 (H) 35.0 - 45.0 mmHg   pO2, Arterial 104.0 (H) 80.0 - 100.0 mmHg   Bicarbonate 26.6 (H) 20.0 - 24.0 mEq/L   TCO2 28 0 - 100 mmol/L   O2 Saturation 97.0 %   Patient temperature 100.4 F    Collection site RADIAL, ALLEN'S TEST ACCEPTABLE    Drawn by RT    Sample type ARTERIAL   HIV antibody     Status: None   Collection Time: 11/16/14  2:53 AM  Result Value Ref Range   HIV Screen 4th Generation wRfx Non Reactive Non Reactive    Comment: (NOTE) Performed At: [facility] [place], [place] [id number] Lindon Romp MD [id number]   Troponin I     Status: Abnormal   Collection Time: 11/16/14  2:53 AM  Result Value Ref Range   Troponin I 0.09 (H) <0.031 ng/mL    Comment:        PERSISTENTLY INCREASED TROPONIN VALUES IN THE RANGE OF 0.04-0.49 ng/mL CAN BE SEEN IN:       -UNSTABLE ANGINA       -CONGESTIVE HEART FAILURE       -MYOCARDITIS       -CHEST TRAUMA       -ARRYHTHMIAS       -LATE PRESENTING MYOCARDIAL INFARCTION       -COPD   CLINICAL FOLLOW-UP RECOMMENDED.   Lactic acid, plasma     Status: None   Collection Time: 11/16/14   2:53 AM  Result Value Ref Range   Lactic Acid, Venous 1.1 0.5 - 2.0 mmol/L  Glucose, capillary     Status: None   Collection Time: 11/16/14  7:50 AM  Result Value Ref Range   Glucose-Capillary 98 65 - 99 mg/dL   Comment 1 Capillary Specimen   Protime-INR     Status: Abnormal   Collection Time: 11/16/14  9:13 AM  Result Value Ref Range   Prothrombin Time 24.3 (H) 11.6 - 15.2 seconds   INR 2.21 (H) 0.00 - 1.49  Troponin I (q 6hr x 3)  Status: Abnormal   Collection Time: 11/16/14  9:13 AM  Result Value Ref Range   Troponin I 0.10 (H) <0.031 ng/mL    Comment:        PERSISTENTLY INCREASED TROPONIN VALUES IN THE RANGE OF 0.04-0.49 ng/mL CAN BE SEEN IN:       -UNSTABLE ANGINA       -CONGESTIVE HEART FAILURE       -MYOCARDITIS       -CHEST TRAUMA       -ARRYHTHMIAS       -LATE PRESENTING MYOCARDIAL INFARCTION       -COPD   CLINICAL FOLLOW-UP RECOMMENDED.   Basic metabolic panel     Status: Abnormal   Collection Time: 11/16/14  9:13 AM  Result Value Ref Range   Sodium 140 135 - 145 mmol/L   Potassium 3.9 3.5 - 5.1 mmol/L   Chloride 109 101 - 111 mmol/L   CO2 25 22 - 32 mmol/L   Glucose, Bld 97 65 - 99 mg/dL   BUN 23 (H) 6 - 20 mg/dL   Creatinine, Ser 1.92 (H) 0.61 - 1.24 mg/dL   Calcium 8.2 (L) 8.9 - 10.3 mg/dL   GFR calc non Af Amer 37 (L) >60 mL/min   GFR calc Af Amer 43 (L) >60 mL/min    Comment: (NOTE) The eGFR has been calculated using the CKD EPI equation. This calculation has not been validated in all clinical situations. eGFR's persistently <60 mL/min signify possible Chronic Kidney Disease.    Anion gap 6 5 - 15  CBC with Differential/Platelet     Status: Abnormal   Collection Time: 11/16/14  9:13 AM  Result Value Ref Range   WBC 18.5 (H) 4.0 - 10.5 K/uL   RBC 3.52 (L) 4.22 - 5.81 MIL/uL   Hemoglobin 11.2 (L) 13.0 - 17.0 g/dL    Comment: REPEATED TO VERIFY DELTA CHECK NOTED    HCT 34.6 (L) 39.0 - 52.0 %   MCV 98.3 78.0 - 100.0 fL   MCH 31.8 26.0 - 34.0  pg   MCHC 32.4 30.0 - 36.0 g/dL   RDW 14.1 11.5 - 15.5 %   Platelets 163 150 - 400 K/uL   Neutrophils Relative % 90 (H) 43 - 77 %   Neutro Abs 16.6 (H) 1.7 - 7.7 K/uL   Lymphocytes Relative 5 (L) 12 - 46 %   Lymphs Abs 1.0 0.7 - 4.0 K/uL   Monocytes Relative 5 3 - 12 %   Monocytes Absolute 0.9 0.1 - 1.0 K/uL   Eosinophils Relative 0 0 - 5 %   Eosinophils Absolute 0.0 0.0 - 0.7 K/uL   Basophils Relative 0 0 - 1 %   Basophils Absolute 0.0 0.0 - 0.1 K/uL  Magnesium     Status: None   Collection Time: 11/16/14  9:13 AM  Result Value Ref Range   Magnesium 1.7 1.7 - 2.4 mg/dL  Procalcitonin - Baseline     Status: None   Collection Time: 11/16/14  9:13 AM  Result Value Ref Range   Procalcitonin 30.59 ng/mL    Comment:        Interpretation: PCT >= 10 ng/mL: Important systemic inflammatory response, almost exclusively due to severe bacterial sepsis or septic shock. (NOTE)         ICU PCT Algorithm               Non ICU PCT Algorithm    ----------------------------     ------------------------------  PCT < 0.25 ng/mL                 PCT < 0.1 ng/mL     Stopping of antibiotics            Stopping of antibiotics       strongly encouraged.               strongly encouraged.    ----------------------------     ------------------------------       PCT level decrease by               PCT < 0.25 ng/mL       >= 80% from peak PCT       OR PCT 0.25 - 0.5 ng/mL          Stopping of antibiotics                                             encouraged.     Stopping of antibiotics           encouraged.    ----------------------------     ------------------------------       PCT level decrease by              PCT >= 0.25 ng/mL       < 80% from peak PCT        AND PCT >= 0.5 ng/mL             Continuing antibiotics                                              encouraged.       Continuing antibiotics            encouraged.    ----------------------------      ------------------------------     PCT level increase compared          PCT > 0.5 ng/mL         with peak PCT AND          PCT >= 0.5 ng/mL             Escalation of antibiotics                                          strongly encouraged.      Escalation of antibiotics        strongly encouraged.   Troponin I (q 6hr x 3)     Status: Abnormal   Collection Time: 11/16/14 10:16 AM  Result Value Ref Range   Troponin I 0.10 (H) <0.031 ng/mL    Comment:        PERSISTENTLY INCREASED TROPONIN VALUES IN THE RANGE OF 0.04-0.49 ng/mL CAN BE SEEN IN:       -UNSTABLE ANGINA       -CONGESTIVE HEART FAILURE       -MYOCARDITIS       -CHEST TRAUMA       -ARRYHTHMIAS       -LATE PRESENTING MYOCARDIAL INFARCTION       -COPD   CLINICAL FOLLOW-UP RECOMMENDED.   Procalcitonin - Baseline  Status: None   Collection Time: 11/16/14 10:16 AM  Result Value Ref Range   Procalcitonin 28.55 ng/mL    Comment:        Interpretation: PCT >= 10 ng/mL: Important systemic inflammatory response, almost exclusively due to severe bacterial sepsis or septic shock. (NOTE)         ICU PCT Algorithm               Non ICU PCT Algorithm    ----------------------------     ------------------------------         PCT < 0.25 ng/mL                 PCT < 0.1 ng/mL     Stopping of antibiotics            Stopping of antibiotics       strongly encouraged.               strongly encouraged.    ----------------------------     ------------------------------       PCT level decrease by               PCT < 0.25 ng/mL       >= 80% from peak PCT       OR PCT 0.25 - 0.5 ng/mL          Stopping of antibiotics                                             encouraged.     Stopping of antibiotics           encouraged.    ----------------------------     ------------------------------       PCT level decrease by              PCT >= 0.25 ng/mL       < 80% from peak PCT        AND PCT >= 0.5 ng/mL             Continuing  antibiotics                                              encouraged.       Continuing antibiotics            encouraged.    ----------------------------     ------------------------------     PCT level increase compared          PCT > 0.5 ng/mL         with peak PCT AND          PCT >= 0.5 ng/mL             Escalation of antibiotics                                          strongly encouraged.      Escalation of antibiotics        strongly encouraged.   Urine rapid drug screen (hosp performed)     Status: None   Collection Time: 11/16/14 10:41 AM  Result Value Ref Range   Opiates NONE  DETECTED NONE DETECTED   Cocaine NONE DETECTED NONE DETECTED   Benzodiazepines NONE DETECTED NONE DETECTED   Amphetamines NONE DETECTED NONE DETECTED   Tetrahydrocannabinol NONE DETECTED NONE DETECTED   Barbiturates NONE DETECTED NONE DETECTED    Comment:        DRUG SCREEN FOR MEDICAL PURPOSES ONLY.  IF CONFIRMATION IS NEEDED FOR ANY PURPOSE, NOTIFY LAB WITHIN 5 DAYS.        LOWEST DETECTABLE LIMITS FOR URINE DRUG SCREEN Drug Class       Cutoff (ng/mL) Amphetamine      1000 Barbiturate      200 Benzodiazepine   387 Tricyclics       564 Opiates          300 Cocaine          300 THC              50   Urinalysis, Routine w reflex microscopic (not at [facility])     Status: Abnormal   Collection Time: 11/16/14 10:42 AM  Result Value Ref Range   Color, Urine AMBER (A) YELLOW    Comment: BIOCHEMICALS MAY BE AFFECTED BY COLOR   APPearance CLEAR CLEAR   Specific Gravity, Urine 1.017 1.005 - 1.030   pH 5.0 5.0 - 8.0   Glucose, UA NEGATIVE NEGATIVE mg/dL   Hgb urine dipstick NEGATIVE NEGATIVE   Bilirubin Urine NEGATIVE NEGATIVE   Ketones, ur NEGATIVE NEGATIVE mg/dL   Protein, ur NEGATIVE NEGATIVE mg/dL   Urobilinogen, UA 1.0 0.0 - 1.0 mg/dL   Nitrite NEGATIVE NEGATIVE   Leukocytes, UA NEGATIVE NEGATIVE    Comment: MICROSCOPIC NOT DONE ON URINES WITH NEGATIVE PROTEIN, BLOOD, LEUKOCYTES, NITRITE, OR  GLUCOSE <1000 mg/dL.  Culture, Urine     Status: None (Preliminary result)   Collection Time: 11/16/14 10:42 AM  Result Value Ref Range   Specimen Description URINE, CLEAN CATCH    Special Requests NONE    Culture CULTURE REINCUBATED FOR BETTER GROWTH    Report Status PENDING   Glucose, capillary     Status: Abnormal   Collection Time: 11/16/14 11:23 AM  Result Value Ref Range   Glucose-Capillary 132 (H) 65 - 99 mg/dL   Comment 1 Capillary Specimen   Glucose, capillary     Status: Abnormal   Collection Time: 11/16/14  5:27 PM  Result Value Ref Range   Glucose-Capillary 145 (H) 65 - 99 mg/dL   Comment 1 Capillary Specimen   Glucose, capillary     Status: Abnormal   Collection Time: 11/16/14  9:50 PM  Result Value Ref Range   Glucose-Capillary 115 (H) 65 - 99 mg/dL   Comment 1 Capillary Specimen   Lactic acid, plasma     Status: None   Collection Time: 11/17/14  2:30 AM  Result Value Ref Range   Lactic Acid, Venous 0.9 0.5 - 2.0 mmol/L  Protime-INR     Status: Abnormal   Collection Time: 11/17/14  2:35 AM  Result Value Ref Range   Prothrombin Time 25.4 (H) 11.6 - 15.2 seconds   INR 2.35 (H) 0.00 - 1.49  Procalcitonin     Status: None   Collection Time: 11/17/14  2:35 AM  Result Value Ref Range   Procalcitonin 24.57 ng/mL    Comment:        Interpretation: PCT >= 10 ng/mL: Important systemic inflammatory response, almost exclusively due to severe bacterial sepsis or septic shock. (NOTE)         ICU PCT Algorithm  Non ICU PCT Algorithm    ----------------------------     ------------------------------         PCT < 0.25 ng/mL                 PCT < 0.1 ng/mL     Stopping of antibiotics            Stopping of antibiotics       strongly encouraged.               strongly encouraged.    ----------------------------     ------------------------------       PCT level decrease by               PCT < 0.25 ng/mL       >= 80% from peak PCT       OR PCT 0.25 - 0.5  ng/mL          Stopping of antibiotics                                             encouraged.     Stopping of antibiotics           encouraged.    ----------------------------     ------------------------------       PCT level decrease by              PCT >= 0.25 ng/mL       < 80% from peak PCT        AND PCT >= 0.5 ng/mL             Continuing antibiotics                                              encouraged.       Continuing antibiotics            encouraged.    ----------------------------     ------------------------------     PCT level increase compared          PCT > 0.5 ng/mL         with peak PCT AND          PCT >= 0.5 ng/mL             Escalation of antibiotics                                          strongly encouraged.      Escalation of antibiotics        strongly encouraged.   CBC     Status: Abnormal   Collection Time: 11/17/14  2:35 AM  Result Value Ref Range   WBC 15.8 (H) 4.0 - 10.5 K/uL   RBC 3.77 (L) 4.22 - 5.81 MIL/uL   Hemoglobin 11.7 (L) 13.0 - 17.0 g/dL   HCT 37.1 (L) 39.0 - 52.0 %   MCV 98.4 78.0 - 100.0 fL   MCH 31.0 26.0 - 34.0 pg   MCHC 31.5 30.0 - 36.0 g/dL   RDW 13.8 11.5 - 15.5 %   Platelets 138 (L) 150 - 400 K/uL  Basic metabolic panel  Status: Abnormal   Collection Time: 11/17/14  2:35 AM  Result Value Ref Range   Sodium 138 135 - 145 mmol/L   Potassium 3.9 3.5 - 5.1 mmol/L   Chloride 106 101 - 111 mmol/L   CO2 26 22 - 32 mmol/L   Glucose, Bld 109 (H) 65 - 99 mg/dL   BUN 23 (H) 6 - 20 mg/dL   Creatinine, Ser 1.90 (H) 0.61 - 1.24 mg/dL   Calcium 8.3 (L) 8.9 - 10.3 mg/dL   GFR calc non Af Amer 37 (L) >60 mL/min   GFR calc Af Amer 43 (L) >60 mL/min    Comment: (NOTE) The eGFR has been calculated using the CKD EPI equation. This calculation has not been validated in all clinical situations. eGFR's persistently <60 mL/min signify possible Chronic Kidney Disease.    Anion gap 6 5 - 15  Phosphorus     Status: None   Collection  Time: 11/17/14  2:35 AM  Result Value Ref Range   Phosphorus 2.8 2.5 - 4.6 mg/dL  Magnesium     Status: None   Collection Time: 11/17/14  2:35 AM  Result Value Ref Range   Magnesium 1.9 1.7 - 2.4 mg/dL  I-STAT 3, arterial blood gas (G3+)     Status: Abnormal   Collection Time: 11/17/14  3:15 AM  Result Value Ref Range   pH, Arterial 7.354 7.350 - 7.450   pCO2 arterial 43.3 35.0 - 45.0 mmHg   pO2, Arterial 62.0 (L) 80.0 - 100.0 mmHg   Bicarbonate 24.1 (H) 20.0 - 24.0 mEq/L   TCO2 25 0 - 100 mmol/L   O2 Saturation 90.0 %   Acid-base deficit 1.0 0.0 - 2.0 mmol/L   Patient temperature 98.7 F    Collection site RADIAL, ALLEN'S TEST ACCEPTABLE    Drawn by RT    Sample type ARTERIAL   Glucose, capillary     Status: Abnormal   Collection Time: 11/17/14  7:32 AM  Result Value Ref Range   Glucose-Capillary 117 (H) 65 - 99 mg/dL  Glucose, capillary     Status: Abnormal   Collection Time: 11/17/14 12:25 PM  Result Value Ref Range   Glucose-Capillary 101 (H) 65 - 99 mg/dL   Comment 1 Notify RN     IMAGING: Dg Chest Port 1 View  11/17/2014   CLINICAL DATA:  Respiratory failure.  EXAM: PORTABLE CHEST - 1 VIEW  COMPARISON:  11/16/2014  FINDINGS: Left-sided AICD lead to the right ventricle. The heart is enlarged. There are dense bilateral pulmonary opacities, increased since the previous exam.  IMPRESSION: Increased opacity of bilateral pulmonary infiltrates.   Electronically Signed   By: Nolon Nations M.D.   On: 11/17/2014 07:23   Dg Chest Port 1 View  11/16/2014   CLINICAL DATA:  Acute respiratory failure with hypoxia. Chronic combined systolic and diastolic congestive heart failure.  EXAM: PORTABLE CHEST - 1 VIEW  COMPARISON:  11/15/2014  FINDINGS: Bilateral diffuse pulmonary airspace disease shows mild improvement since previous study. Mild cardiomegaly stable. AICD remains in appropriate position. No pneumothorax visualized.  IMPRESSION: Mild improvement diffuse bilateral pulmonary  airspace disease. Stable mild cardiomegaly.   Electronically Signed   By: Earle Gell M.D.   On: 11/16/2014 08:00   Dg Chest Port 1 View  11/15/2014   CLINICAL DATA:  Shortness of breath, on BiPAP  EXAM: PORTABLE CHEST - 1 VIEW  COMPARISON:  12/29/2012  FINDINGS: Multifocal patchy opacities in the right lung and possibly in the  left suprahilar region. This appearance favors multifocal pneumonia, less likely interstitial edema.  No pleural effusion or pneumothorax.  Cardiomegaly.  Left subclavian ICD.  IMPRESSION: Multifocal patchy opacities, right lung predominant, suspicious for multifocal pneumonia.  Interstitial edema is considered less likely.   Electronically Signed   By: Julian Hy M.D.   On: 11/15/2014 18:19    HOSPITAL DIAGNOSES: Principal Problem:   Severe sepsis with septic shock Active Problems:   Diabetes   Hyperlipidemia   Gout   Obstructive sleep apnea   Essential hypertension   Paroxysmal ventricular tachycardia   Chronic combined systolic and diastolic CHF (congestive heart failure)   History of cardiovascular disorder   Automatic implantable cardioverter-defibrillator in situ   Warfarin anticoagulation   PAF (paroxysmal atrial fibrillation)   Nonischemic cardiomyopathy   Sepsis   CAP (community acquired pneumonia)   Acute respiratory failure with hypoxia   Pneumonia   CKD (chronic kidney disease), stage II   Elevated troponin   IMPRESSION/RECOMMENDATION: 1. Non-ischemic cardiomyopathy - EF 35-40%, echo suggests high LV filling pressure. Exam consistent with acute decompensated systolic congestive heart failure. CXR shows interval worsening of infiltrates. Will need additional diuresis. Start lasix 40 mg IV BID -1st dose now. Check BNP in am. Holding ACE-I and aldactone due to AKI. 2. PAF - CHADSVASC score is 5 or 6, on chronic warfarin anticoagulation. INR is therapeutic. 3. CAP/sepsis - managed by PCCM 4. CKD II - creatinine is hovering around 1.9. Baseline  reported to be around 1.4. Holding ACE-I and aldactone. 5. Elevated troponin - due to heart failure, no suspicion of ACS. No known CAD. 6. AICD - single chamber [employer] device, he denies any firings  Thanks for consulting [place]. Cardiology will follow closely with you.  Time Spent Directly with Patient: 45 minutes  Pixie Casino, MD, [facility] Attending Cardiologist [place] 11/17/2014, 3:19 PM

## 2014-11-17 NOTE — Progress Notes (Signed)
ANTICOAGULATION CONSULT NOTE - Follow Up Consult  Pharmacy Consult for warfarin Indication: afib/history of CVA  No Known Allergies  Patient Measurements: Height: 6' (182.9 cm) Weight: 264 lb 8.8 oz (120 kg) IBW/kg (Calculated) : 77.6  Vital Signs: Temp: 98.8 F (37.1 C) (07/16 0733) Temp Source: Oral (07/16 0733) BP: 93/64 mmHg (07/16 0733) Pulse Rate: 83 (07/16 0733)  Labs:  Recent Labs  11/15/14 1800 11/16/14 0253 11/16/14 0913 11/16/14 1016 11/17/14 0235  HGB 14.1  --  11.2*  --  11.7*  HCT 44.2  --  34.6*  --  37.1*  PLT 205  --  163  --  138*  LABPROT 21.3*  --  24.3*  --  25.4*  INR 1.85*  --  2.21*  --  2.35*  CREATININE 1.45*  --  1.92*  --  1.90*  TROPONINI 0.09* 0.09* 0.10* 0.10*  --     Estimated Creatinine Clearance: 56.7 mL/min (by C-G formula based on Cr of 1.9).  Assessment: 59 yo m on warfarin PTA for h/o CVA and afib.  PTA dose is 10 mg daily except 5 mg on MWF.  INR therapeutic today at 2.35. CBC stable.  Goal of Therapy:  INR 2-3 Monitor platelets by anticoagulation protocol: Yes   Plan:  Warfarin 10 mg x 1 again tonight F/u INR in AM Can likely restart home dose tomorrow Monitor hgb/plts, s/s of bleeding, clinical course  Zaheer Wageman L. Nicole Kindred, PharmD Clinical Pharmacy Resident Pager: [phone] 11/17/2014 9:20 AM

## 2014-11-18 ENCOUNTER — Inpatient Hospital Stay (HOSPITAL_COMMUNITY): Payer: Medicare Other

## 2014-11-18 DIAGNOSIS — N182 Chronic kidney disease, stage 2 (mild): Secondary | ICD-10-CM

## 2014-11-18 DIAGNOSIS — J9601 Acute respiratory failure with hypoxia: Secondary | ICD-10-CM

## 2014-11-18 LAB — BASIC METABOLIC PANEL
Anion gap: 7 (ref 5–15)
BUN: 29 mg/dL — ABNORMAL HIGH (ref 6–20)
CO2: 28 mmol/L (ref 22–32)
Calcium: 8.2 mg/dL — ABNORMAL LOW (ref 8.9–10.3)
Chloride: 102 mmol/L (ref 101–111)
Creatinine, Ser: 1.69 mg/dL — ABNORMAL HIGH (ref 0.61–1.24)
GFR calc Af Amer: 50 mL/min — ABNORMAL LOW (ref >60)
GFR calc non Af Amer: 43 mL/min — ABNORMAL LOW (ref >60)
GLUCOSE: 109 mg/dL — AB (ref 65–99)
Potassium: 4.2 mmol/L (ref 3.5–5.1)
Sodium: 137 mmol/L (ref 135–145)

## 2014-11-18 LAB — CBC
HCT: 31.3 % — ABNORMAL LOW (ref 39.0–52.0)
Hemoglobin: 10.1 g/dL — ABNORMAL LOW (ref 13.0–17.0)
MCH: 32 pg (ref 26.0–34.0)
MCHC: 32.3 g/dL (ref 30.0–36.0)
MCV: 99.1 fL (ref 78.0–100.0)
PLATELETS: 125 10*3/uL — AB (ref 150–400)
RBC: 3.16 MIL/uL — AB (ref 4.22–5.81)
RDW: 13.7 % (ref 11.5–15.5)
WBC: 13.9 10*3/uL — ABNORMAL HIGH (ref 4.0–10.5)

## 2014-11-18 LAB — PROTIME-INR
INR: 2.94 — ABNORMAL HIGH (ref 0.00–1.49)
Prothrombin Time: 30.1 seconds — ABNORMAL HIGH (ref 11.6–15.2)

## 2014-11-18 LAB — GLUCOSE, CAPILLARY
GLUCOSE-CAPILLARY: 143 mg/dL — AB (ref 65–99)
GLUCOSE-CAPILLARY: 126 mg/dL — AB (ref 65–99)
GLUCOSE-CAPILLARY: 131 mg/dL — AB (ref 65–99)
Glucose-Capillary: 93 mg/dL (ref 65–99)
Glucose-Capillary: 100 mg/dL — ABNORMAL HIGH (ref 65–99)

## 2014-11-18 LAB — PROCALCITONIN: Procalcitonin: 17.15 ng/mL

## 2014-11-18 LAB — URINE CULTURE

## 2014-11-18 LAB — BRAIN NATRIURETIC PEPTIDE: B Natriuretic Peptide: 309.7 pg/mL — ABNORMAL HIGH (ref 0.0–100.0)

## 2014-11-18 MED ORDER — WARFARIN SODIUM 5 MG PO TABS
5.0000 mg | ORAL_TABLET | Freq: Once | ORAL | Status: AC
  Administered 2014-11-18: 5 mg via ORAL
  Filled 2014-11-18: qty 1

## 2014-11-18 NOTE — Progress Notes (Signed)
Called by RN that patient SPO2 was dropping into 70's and not coming up. Patient was mouth breathing with nasal mask on. Switched patient to a Large FFM. Patient is tolerating well. SPO2 96% and resting comfortably. RT will continue to monitor.

## 2014-11-18 NOTE — Progress Notes (Signed)
PULMONARY / CRITICAL CARE MEDICINE   Name: [name] MRN: [id number] DOB: [birth year]    ADMISSION DATE:  11/15/2014 CONSULTATION DATE:  11/16/2014  REFERRING MD : triad  CHIEF COMPLAINT:  sob  INITIAL PRESENTATION:  59 yo male former smoker with acute hypoxic/hypercapnic respiratory failure from CAP and pulmonary edema.  STUDIES:  7/15 Echo >> EF 35 to [id number], grade 1 diastolic dysfx, PAS 42 mmHg  SIGNIFICANT EVENTS: 7/15 tx to ICU for hypoxia /hypercarbia/hypotension 7/16 transfer to stepdown  SUBJECTIVE:  No chest pain or pressure. No nausea or vomiting. Continues to have dyspnea with minimal cough.patient has had minimal ambulation since transferring to stepdown unit.  ROS:  No subjective fever, chills, or sweats. No headache or vision changes.  VITAL SIGNS: Temp:  [97.9 F (36.6 C)-102.4 F (39.1 C)] 99.6 F (37.6 C) (07/17 1215) Pulse Rate:  [76-102] 81 (07/17 1210) Resp:  [10-30] 30 (07/17 1210) BP: (91-128)/(56-81) 102/62 mmHg (07/17 1210) SpO2:  [94 %-100 %] 96 % (07/17 1210) INTAKE / OUTPUT:  Intake/Output Summary (Last 24 hours) at 11/18/14 1348 Last data filed at 11/18/14 0900  Gross per 24 hour  Intake    510 ml  Output   1725 ml  Net  -1215 ml    PHYSICAL EXAMINATION Gen:  No distress. NRB in place. Laying on side in bed. Integument:  Warm & dry. No rash. Lymphatics:  No appreciable cervical or supraclavicular LAD. Cardiovascular:  Regular rate. No appreciable JVD. Pulmonary:  Clear to auscultation bilaterally. Normal work of breathing on nonrebreather mask. Speaking in complete sentences.  Abdomen: Soft. Nontender. Normal bowel sounds.  LABS:  CBC  Recent Labs Lab 11/16/14 0913 11/17/14 0235 11/18/14 0228  WBC 18.5* 15.8* 13.9*  HGB 11.2* 11.7* 10.1*  HCT 34.6* 37.1* 31.3*  PLT 163 138* 125*   Coag's  Recent Labs Lab 11/16/14 0913 11/17/14 0235 11/18/14 0228  INR 2.21* 2.35* 2.94*   BMET  Recent Labs Lab 11/16/14 0913  11/17/14 0235 11/18/14 0228  NA 140 138 137  K 3.9 3.9 4.2  CL 109 106 102  CO2 25 26 28   BUN 23* 23* 29*  CREATININE 1.92* 1.90* 1.69*  GLUCOSE 97 109* 109*   Electrolytes  Recent Labs Lab 11/16/14 0913 11/17/14 0235 11/18/14 0228  CALCIUM 8.2* 8.3* 8.2*  MG 1.7 1.9  --   PHOS  --  2.8  --    Sepsis Markers  Recent Labs Lab 11/15/14 2150 11/16/14 0253  11/16/14 1016 11/17/14 0230 11/17/14 0235 11/18/14 0228  LATICACIDVEN 0.84 1.1  --   --  0.9  --   --   PROCALCITON  --   --   < > 28.55  --  24.57 17.15  < > = values in this interval not displayed. ABG  Recent Labs Lab 11/15/14 2351 11/17/14 0315  PHART 7.311* 7.354  PCO2ART 53.3* 43.3  PO2ART 104.0* 62.0*   Liver Enzymes  Recent Labs Lab 11/15/14 1800  AST 34  ALT 27  ALKPHOS 71  BILITOT 1.2  ALBUMIN 3.9   Cardiac Enzymes  Recent Labs Lab 11/16/14 0253 11/16/14 0913 11/16/14 1016  TROPONINI 0.09* 0.10* 0.10*   Glucose  Recent Labs Lab 11/17/14 0732 11/17/14 1225 11/17/14 1717 11/17/14 2122 11/18/14 0744 11/18/14 1208  GLUCAP 117* 101* 143* 194* 93 100*    Imaging Dg Chest Port 1 View  11/18/2014   CLINICAL DATA:  Community acquired pneumonia  EXAM: PORTABLE CHEST - 1 VIEW  COMPARISON:  11/17/2014  FINDINGS: There is a left chest wall ICD with lead in the right ventricle. Moderate cardiac enlargement persistent bilateral airspace opacities, not significantly changed from previous exam  IMPRESSION: 1. No change in aeration a lungs compared with previous exam.   Electronically Signed   By: Kerby Moors M.D.   On: 11/18/2014 08:30   Cultures: Blood 7/14 >> Urine 7/15 >> Legionella Ag 7/16 >> Pneumococcal Ag 7/16 >> Negative  ASSESSMENT / PLAN:  Acute on chronic hypoxic/hypercapnic respiratory failure 2nd to CAP and acute pulmonary edema. OSA/OHS. Secondary pulmonary HTN. Plan: - oxygen to keep SpO2 90 to 95% - CPAP qhs - goal negative fluid balance  Acute on chronic  combined heart failure. Hx of A fib, CAD, HTN, HLD, CVA. S/p AICD. Plan: - cardiology following - continue amiodarone, lipitor, coreg, isordil - hold norvasc, altace, aldactone - coumadin per pharmacy - daily INR  Stage 2 CKD >> baseline creatinine 1.4. Acute renal failure. Plan: - trend UOP - trend daily BUN/Creatinine  CAP >> procalcitonin 30.59 from 7/15. Plan: - trend leukocyte count - PCT algorithm - Day 4 rocephin, zithromax  DM type II. Hx of gout. Plan: - SSI qAC & HS - Lantus - hold metformin for now - allopurinol  Muscle spasms. Plan: - continue zanaflex  Creig Hines E. Ashok Cordia, M.D. [facility] Pager:  [phone] After 3pm or if no response, call [phone]

## 2014-11-18 NOTE — Progress Notes (Signed)
DAILY PROGRESS NOTE  Subjective:  Fevers noted overnight. Still with high O2 requirement. Diuresed about -3L negative overnight. Leukocytosis is improving. Creatinine has improved to 1.69 (from 1.9). BNP is low at 309.  Objective:  Temp:  [97.9 F (36.6 C)-102.4 F (39.1 C)] 98.2 F (36.8 C) (07/17 0747) Pulse Rate:  [76-102] 80 (07/17 0745) Resp:  [10-31] 24 (07/17 0745) BP: (91-142)/(54-81) 105/68 mmHg (07/17 0745) SpO2:  [86 %-100 %] 100 % (07/17 0745) Weight change:   Intake/Output from previous day: 07/16 0701 - 07/17 0700 In: 980 [P.O.:980] Out: 2200 [Urine:2200]  Intake/Output from this shift: Total I/O In: 240 [P.O.:240] Out: -   Medications: Current Facility-Administered Medications  Medication Dose Route Frequency Provider Last Rate Last Dose  . acetaminophen (TYLENOL) tablet 650 mg  650 mg Oral Q6H PRN Anders Simmonds, MD   650 mg at 11/17/14 2009  . allopurinol (ZYLOPRIM) tablet 300 mg  300 mg Oral Daily Ivor Costa, MD   300 mg at 11/18/14 0942  . amiodarone (PACERONE) tablet 200 mg  200 mg Oral QHS Ivor Costa, MD   200 mg at 11/17/14 2200  . antiseptic oral rinse (BIOTENE) solution 15 mL  15 mL Mouth Rinse BID Ivor Costa, MD   15 mL at 11/18/14 0800  . antiseptic oral rinse (CPC / CETYLPYRIDINIUM CHLORIDE 0.05%) solution 7 mL  7 mL Mouth Rinse BID Wilhelmina Mcardle, MD   7 mL at 11/18/14 1000  . atorvastatin (LIPITOR) tablet 80 mg  80 mg Oral QHS Ivor Costa, MD   80 mg at 11/17/14 2200  . azithromycin (ZITHROMAX) 500 mg in dextrose 5 % 250 mL IVPB  500 mg Intravenous Q24H Geronimo Boot, MD 250 mL/hr at 11/17/14 2000 500 mg at 11/17/14 2000  . carvedilol (COREG) tablet 25 mg  25 mg Oral BID Ivor Costa, MD   25 mg at 11/18/14 0941  . cefTRIAXone (ROCEPHIN) 1 g in dextrose 5 % 50 mL IVPB  1 g Intravenous Q24H Geronimo Boot, MD 100 mL/hr at 11/17/14 2000 1 g at 11/17/14 2000  . dextromethorphan-guaiFENesin (MUCINEX DM) 30-600 MG per 12 hr tablet 1 tablet  1 tablet  Oral BID Ivor Costa, MD   1 tablet at 11/18/14 0941  . furosemide (LASIX) injection 40 mg  40 mg Intravenous BID Pixie Casino, MD   40 mg at 11/18/14 0800  . hydrALAZINE (APRESOLINE) injection 5 mg  5 mg Intravenous Q2H PRN Ivor Costa, MD      . insulin aspart (novoLOG) injection 0-9 Units  0-9 Units Subcutaneous TID WC Ivor Costa, MD   1 Units at 11/16/14 1730  . insulin glargine (LANTUS) injection 7 Units  7 Units Subcutaneous QHS Ivor Costa, MD   7 Units at 11/17/14 2200  . ipratropium-albuterol (DUONEB) 0.5-2.5 (3) MG/3ML nebulizer solution 3 mL  3 mL Nebulization Q4H PRN Ivor Costa, MD   3 mL at 11/16/14 1535  . isosorbide dinitrate (ISORDIL) tablet 20 mg  20 mg Oral TID Ivor Costa, MD   20 mg at 11/18/14 0942  . multivitamin with minerals tablet 1 tablet  1 tablet Oral Daily Ivor Costa, MD   1 tablet at 11/18/14 [phone]  . nitroGLYCERIN (NITROSTAT) SL tablet 0.4 mg  0.4 mg Sublingual Q5 Min x 3 PRN Ivor Costa, MD      . oxyCODONE-acetaminophen (PERCOCET/ROXICET) 5-325 MG per tablet 1-2 tablet  1-2 tablet Oral Q8H PRN Ivor Costa, MD      . tiZANidine (  ZANAFLEX) tablet 4 mg  4 mg Oral Q6H PRN Ivor Costa, MD      . Warfarin - Pharmacist Dosing Inpatient   Does not apply q1800 Kris Mouton, [facility]        Physical Exam: General appearance: alert and no distress Neck: JVD - 3 cm above sternal notch and no carotid bruit Lungs: diminished breath sounds RLL and rales RLL Heart: regular rate and rhythm, S1, S2 normal, no murmur, click, rub or gallop  Lab Results: Results for orders placed or performed during the hospital encounter of 11/15/14 (from the past 48 hour(s))  Glucose, capillary     Status: Abnormal   Collection Time: 11/16/14 11:23 AM  Result Value Ref Range   Glucose-Capillary 132 (H) 65 - 99 mg/dL   Comment 1 Capillary Specimen   Glucose, capillary     Status: Abnormal   Collection Time: 11/16/14  5:27 PM  Result Value Ref Range   Glucose-Capillary 145 (H) 65 - 99 mg/dL   Comment 1  Capillary Specimen   Glucose, capillary     Status: Abnormal   Collection Time: 11/16/14  9:50 PM  Result Value Ref Range   Glucose-Capillary 115 (H) 65 - 99 mg/dL   Comment 1 Capillary Specimen   Lactic acid, plasma     Status: None   Collection Time: 11/17/14  2:30 AM  Result Value Ref Range   Lactic Acid, Venous 0.9 0.5 - 2.0 mmol/L  Protime-INR     Status: Abnormal   Collection Time: 11/17/14  2:35 AM  Result Value Ref Range   Prothrombin Time 25.4 (H) 11.6 - 15.2 seconds   INR 2.35 (H) 0.00 - 1.49  Procalcitonin     Status: None   Collection Time: 11/17/14  2:35 AM  Result Value Ref Range   Procalcitonin 24.57 ng/mL    Comment:        Interpretation: PCT >= 10 ng/mL: Important systemic inflammatory response, almost exclusively due to severe bacterial sepsis or septic shock. (NOTE)         ICU PCT Algorithm               Non ICU PCT Algorithm    ----------------------------     ------------------------------         PCT < 0.25 ng/mL                 PCT < 0.1 ng/mL     Stopping of antibiotics            Stopping of antibiotics       strongly encouraged.               strongly encouraged.    ----------------------------     ------------------------------       PCT level decrease by               PCT < 0.25 ng/mL       >= 80% from peak PCT       OR PCT 0.25 - 0.5 ng/mL          Stopping of antibiotics                                             encouraged.     Stopping of antibiotics           encouraged.    ----------------------------     ------------------------------  PCT level decrease by              PCT >= 0.25 ng/mL       < 80% from peak PCT        AND PCT >= 0.5 ng/mL             Continuing antibiotics                                              encouraged.       Continuing antibiotics            encouraged.    ----------------------------     ------------------------------     PCT level increase compared          PCT > 0.5 ng/mL         with peak PCT  AND          PCT >= 0.5 ng/mL             Escalation of antibiotics                                          strongly encouraged.      Escalation of antibiotics        strongly encouraged.   CBC     Status: Abnormal   Collection Time: 11/17/14  2:35 AM  Result Value Ref Range   WBC 15.8 (H) 4.0 - 10.5 K/uL   RBC 3.77 (L) 4.22 - 5.81 MIL/uL   Hemoglobin 11.7 (L) 13.0 - 17.0 g/dL   HCT 37.1 (L) 39.0 - 52.0 %   MCV 98.4 78.0 - 100.0 fL   MCH 31.0 26.0 - 34.0 pg   MCHC 31.5 30.0 - 36.0 g/dL   RDW 13.8 11.5 - 15.5 %   Platelets 138 (L) 150 - 400 K/uL  Basic metabolic panel     Status: Abnormal   Collection Time: 11/17/14  2:35 AM  Result Value Ref Range   Sodium 138 135 - 145 mmol/L   Potassium 3.9 3.5 - 5.1 mmol/L   Chloride 106 101 - 111 mmol/L   CO2 26 22 - 32 mmol/L   Glucose, Bld 109 (H) 65 - 99 mg/dL   BUN 23 (H) 6 - 20 mg/dL   Creatinine, Ser 1.90 (H) 0.61 - 1.24 mg/dL   Calcium 8.3 (L) 8.9 - 10.3 mg/dL   GFR calc non Af Amer 37 (L) >60 mL/min   GFR calc Af Amer 43 (L) >60 mL/min    Comment: (NOTE) The eGFR has been calculated using the CKD EPI equation. This calculation has not been validated in all clinical situations. eGFR's persistently <60 mL/min signify possible Chronic Kidney Disease.    Anion gap 6 5 - 15  Phosphorus     Status: None   Collection Time: 11/17/14  2:35 AM  Result Value Ref Range   Phosphorus 2.8 2.5 - 4.6 mg/dL  Magnesium     Status: None   Collection Time: 11/17/14  2:35 AM  Result Value Ref Range   Magnesium 1.9 1.7 - 2.4 mg/dL  I-STAT 3, arterial blood gas (G3+)     Status: Abnormal   Collection Time: 11/17/14  3:15 AM  Result Value Ref Range   pH, Arterial 7.354  7.350 - 7.450   pCO2 arterial 43.3 35.0 - 45.0 mmHg   pO2, Arterial 62.0 (L) 80.0 - 100.0 mmHg   Bicarbonate 24.1 (H) 20.0 - 24.0 mEq/L   TCO2 25 0 - 100 mmol/L   O2 Saturation 90.0 %   Acid-base deficit 1.0 0.0 - 2.0 mmol/L   Patient temperature 98.7 F    Collection site  RADIAL, ALLEN'S TEST ACCEPTABLE    Drawn by RT    Sample type ARTERIAL   Glucose, capillary     Status: Abnormal   Collection Time: 11/17/14  7:32 AM  Result Value Ref Range   Glucose-Capillary 117 (H) 65 - 99 mg/dL  Glucose, capillary     Status: Abnormal   Collection Time: 11/17/14 12:25 PM  Result Value Ref Range   Glucose-Capillary 101 (H) 65 - 99 mg/dL   Comment 1 Notify RN   Glucose, capillary     Status: Abnormal   Collection Time: 11/17/14  5:17 PM  Result Value Ref Range   Glucose-Capillary 143 (H) 65 - 99 mg/dL  Strep pneumoniae urinary antigen     Status: None   Collection Time: 11/17/14  5:20 PM  Result Value Ref Range   Strep Pneumo Urinary Antigen NEGATIVE NEGATIVE    Comment:        Infection due to S. pneumoniae cannot be absolutely ruled out since the antigen present may be below the detection limit of the test.   Glucose, capillary     Status: Abnormal   Collection Time: 11/17/14  9:22 PM  Result Value Ref Range   Glucose-Capillary 194 (H) 65 - 99 mg/dL   Comment 1 Notify RN   Protime-INR     Status: Abnormal   Collection Time: 11/18/14  2:28 AM  Result Value Ref Range   Prothrombin Time 30.1 (H) 11.6 - 15.2 seconds   INR 2.94 (H) 0.00 - 1.49  Procalcitonin     Status: None   Collection Time: 11/18/14  2:28 AM  Result Value Ref Range   Procalcitonin 17.15 ng/mL    Comment:        Interpretation: PCT >= 10 ng/mL: Important systemic inflammatory response, almost exclusively due to severe bacterial sepsis or septic shock. (NOTE)         ICU PCT Algorithm               Non ICU PCT Algorithm    ----------------------------     ------------------------------         PCT < 0.25 ng/mL                 PCT < 0.1 ng/mL     Stopping of antibiotics            Stopping of antibiotics       strongly encouraged.               strongly encouraged.    ----------------------------     ------------------------------       PCT level decrease by               PCT <  0.25 ng/mL       >= 80% from peak PCT       OR PCT 0.25 - 0.5 ng/mL          Stopping of antibiotics  encouraged.     Stopping of antibiotics           encouraged.    ----------------------------     ------------------------------       PCT level decrease by              PCT >= 0.25 ng/mL       < 80% from peak PCT        AND PCT >= 0.5 ng/mL             Continuing antibiotics                                              encouraged.       Continuing antibiotics            encouraged.    ----------------------------     ------------------------------     PCT level increase compared          PCT > 0.5 ng/mL         with peak PCT AND          PCT >= 0.5 ng/mL             Escalation of antibiotics                                          strongly encouraged.      Escalation of antibiotics        strongly encouraged.   Basic metabolic panel     Status: Abnormal   Collection Time: 11/18/14  2:28 AM  Result Value Ref Range   Sodium 137 135 - 145 mmol/L   Potassium 4.2 3.5 - 5.1 mmol/L   Chloride 102 101 - 111 mmol/L   CO2 28 22 - 32 mmol/L   Glucose, Bld 109 (H) 65 - 99 mg/dL   BUN 29 (H) 6 - 20 mg/dL   Creatinine, Ser 1.69 (H) 0.61 - 1.24 mg/dL   Calcium 8.2 (L) 8.9 - 10.3 mg/dL   GFR calc non Af Amer 43 (L) >60 mL/min   GFR calc Af Amer 50 (L) >60 mL/min    Comment: (NOTE) The eGFR has been calculated using the CKD EPI equation. This calculation has not been validated in all clinical situations. eGFR's persistently <60 mL/min signify possible Chronic Kidney Disease.    Anion gap 7 5 - 15  CBC     Status: Abnormal   Collection Time: 11/18/14  2:28 AM  Result Value Ref Range   WBC 13.9 (H) 4.0 - 10.5 K/uL   RBC 3.16 (L) 4.22 - 5.81 MIL/uL   Hemoglobin 10.1 (L) 13.0 - 17.0 g/dL   HCT 31.3 (L) 39.0 - 52.0 %   MCV 99.1 78.0 - 100.0 fL   MCH 32.0 26.0 - 34.0 pg   MCHC 32.3 30.0 - 36.0 g/dL   RDW 13.7 11.5 - 15.5 %   Platelets 125 (L)  150 - 400 K/uL  Brain natriuretic peptide     Status: Abnormal   Collection Time: 11/18/14  2:28 AM  Result Value Ref Range   B Natriuretic Peptide 309.7 (H) 0.0 - 100.0 pg/mL  Glucose, capillary     Status: None   Collection Time: 11/18/14  7:44 AM  Result Value Ref  Range   Glucose-Capillary 93 65 - 99 mg/dL   Comment 1 Notify RN    Comment 2 Document in Chart     Imaging: Dg Chest Port 1 View  11/18/2014   CLINICAL DATA:  Community acquired pneumonia  EXAM: PORTABLE CHEST - 1 VIEW  COMPARISON:  11/17/2014  FINDINGS: There is a left chest wall ICD with lead in the right ventricle. Moderate cardiac enlargement persistent bilateral airspace opacities, not significantly changed from previous exam  IMPRESSION: 1. No change in aeration a lungs compared with previous exam.   Electronically Signed   By: Kerby Moors M.D.   On: 11/18/2014 08:30   Dg Chest Port 1 View  11/17/2014   CLINICAL DATA:  Respiratory failure.  EXAM: PORTABLE CHEST - 1 VIEW  COMPARISON:  11/16/2014  FINDINGS: Left-sided AICD lead to the right ventricle. The heart is enlarged. There are dense bilateral pulmonary opacities, increased since the previous exam.  IMPRESSION: Increased opacity of bilateral pulmonary infiltrates.   Electronically Signed   By: Nolon Nations M.D.   On: 11/17/2014 07:23    Assessment:  1. Principal Problem: 2.   Severe sepsis with septic shock 3. Active Problems: 4.   Diabetes 5.   Hyperlipidemia 6.   Gout 7.   Obstructive sleep apnea 8.   Essential hypertension 9.   Paroxysmal ventricular tachycardia 10.   Chronic combined systolic and diastolic CHF (congestive heart failure) 11.   History of cardiovascular disorder 12.   Automatic implantable cardioverter-defibrillator in situ 13.   Warfarin anticoagulation 14.   PAF (paroxysmal atrial fibrillation) 15.   Nonischemic cardiomyopathy 16.   Sepsis 17.   CAP (community acquired pneumonia) 42.   Acute respiratory failure with  hypoxia 19.   Pneumonia 20.   CKD (chronic kidney disease), stage II 21.   Elevated troponin 22.   Plan:  1. Continue IV diuresis today - may be able to switch to po diuretics tomorrow if he can be weaned off NRB today.  Time Spent Directly with Patient:  15 minutes  Length of Stay:  LOS: 3 days   Pixie Casino, MD, [facility] Attending Cardiologist [place] 11/18/2014, 10:44 AM

## 2014-11-18 NOTE — Progress Notes (Signed)
Patient is awake and states that he is ready to come off CPAP. Patient placed back on NRB 15 LPM. SPO2 100%. Pt tolerating well at this time.

## 2014-11-18 NOTE — Progress Notes (Addendum)
ANTICOAGULATION CONSULT NOTE - Follow Up Consult  Pharmacy Consult for warfarin Indication: afib/history of CVA  No Known Allergies  Patient Measurements: Height: 6' (182.9 cm) Weight: 264 lb 8.8 oz (120 kg) IBW/kg (Calculated) : 77.6  Vital Signs: Temp: 98.2 F (36.8 C) (07/17 0747) Temp Source: Oral (07/17 0747) BP: 105/68 mmHg (07/17 0745) Pulse Rate: 80 (07/17 0745)  Labs:  Recent Labs  11/16/14 0253 11/16/14 0913 11/16/14 1016 11/17/14 0235 11/18/14 0228  HGB  --  11.2*  --  11.7* 10.1*  HCT  --  34.6*  --  37.1* 31.3*  PLT  --  163  --  138* 125*  LABPROT  --  24.3*  --  25.4* 30.1*  INR  --  2.21*  --  2.35* 2.94*  CREATININE  --  1.92*  --  1.90* 1.69*  TROPONINI 0.09* 0.10* 0.10*  --   --     Estimated Creatinine Clearance: 63.8 mL/min (by C-G formula based on Cr of 1.69).  Assessment: 59 yo m on warfarin PTA for h/o CVA and afib.  PTA dose is 10 mg daily except 5 mg on MWF.  INR remains therapeutic today at 2.94, increased from 2.35. PLTC 125 < 138<163K. Hgb 10.1< 11.7. Weight = 120 kg.  Continues on amiodarone 200 mg po daily which he was taking PTA along with coumadin. On azithromycin IV since 11/15/14 which may increase hypoprothrombinemic effect of coumadin. Will give lower dose of 5mg  tonight due to possible new drug-drug interaction with azithromycin as INR has increased from 2.35 to 2.94 in one day. No bleeding noted.  Goal of Therapy:  INR 2-3 Monitor platelets by anticoagulation protocol: Yes   Plan:  Warfarin 5 mg x 1 tonight  (lower dose due to patient on azithromycin). Patient prefers to take coumadin at bedtime. F/u INR in AM Monitor hgb/plts, s/s of bleeding, clinical course  Nicole Cella,  Clinical Pharmacist Pager: [phone] 11/18/2014 12:01 PM

## 2014-11-18 NOTE — Progress Notes (Signed)
RT notified that patient saturations still dropping on a full face mask, rt to bedside to adjust settings pat saturations now 88-92 will continue to monitor

## 2014-11-19 DIAGNOSIS — I5043 Acute on chronic combined systolic (congestive) and diastolic (congestive) heart failure: Secondary | ICD-10-CM

## 2014-11-19 LAB — PROTIME-INR
INR: 3.18 — AB (ref 0.00–1.49)
Prothrombin Time: 32 seconds — ABNORMAL HIGH (ref 11.6–15.2)

## 2014-11-19 LAB — GLUCOSE, CAPILLARY
Glucose-Capillary: 93 mg/dL (ref 65–99)
Glucose-Capillary: 146 mg/dL — ABNORMAL HIGH (ref 65–99)
Glucose-Capillary: 110 mg/dL — ABNORMAL HIGH (ref 65–99)
Glucose-Capillary: 180 mg/dL — ABNORMAL HIGH (ref 65–99)

## 2014-11-19 LAB — CBC WITH DIFFERENTIAL/PLATELET
BASOS PCT: 0 % (ref 0–1)
Basophils Absolute: 0 10*3/uL (ref 0.0–0.1)
Eosinophils Absolute: 0.1 10*3/uL (ref 0.0–0.7)
Eosinophils Relative: 1 % (ref 0–5)
HEMATOCRIT: 30.9 % — AB (ref 39.0–52.0)
Hemoglobin: 9.7 g/dL — ABNORMAL LOW (ref 13.0–17.0)
LYMPHS ABS: 0.5 10*3/uL — AB (ref 0.7–4.0)
Lymphocytes Relative: 5 % — ABNORMAL LOW (ref 12–46)
MCH: 30.7 pg (ref 26.0–34.0)
MCHC: 31.4 g/dL (ref 30.0–36.0)
MCV: 97.8 fL (ref 78.0–100.0)
Monocytes Absolute: 0.6 10*3/uL (ref 0.1–1.0)
Monocytes Relative: 6 % (ref 3–12)
Neutro Abs: 8.4 10*3/uL — ABNORMAL HIGH (ref 1.7–7.7)
Neutrophils Relative %: 87 % — ABNORMAL HIGH (ref 43–77)
Platelets: 126 10*3/uL — ABNORMAL LOW (ref 150–400)
RBC: 3.16 MIL/uL — AB (ref 4.22–5.81)
RDW: 13.3 % (ref 11.5–15.5)
WBC: 9.6 10*3/uL (ref 4.0–10.5)

## 2014-11-19 LAB — RENAL FUNCTION PANEL
ANION GAP: 7 (ref 5–15)
Albumin: 2.5 g/dL — ABNORMAL LOW (ref 3.5–5.0)
BUN: 29 mg/dL — ABNORMAL HIGH (ref 6–20)
CO2: 32 mmol/L (ref 22–32)
CREATININE: 1.5 mg/dL — AB (ref 0.61–1.24)
Calcium: 8.4 mg/dL — ABNORMAL LOW (ref 8.9–10.3)
Chloride: 101 mmol/L (ref 101–111)
GFR calc non Af Amer: 50 mL/min — ABNORMAL LOW (ref >60)
GFR, EST AFRICAN AMERICAN: 58 mL/min — AB (ref >60)
Glucose, Bld: 105 mg/dL — ABNORMAL HIGH (ref 65–99)
PHOSPHORUS: 2.6 mg/dL (ref 2.5–4.6)
Potassium: 4.3 mmol/L (ref 3.5–5.1)
SODIUM: 140 mmol/L (ref 135–145)

## 2014-11-19 LAB — LEGIONELLA ANTIGEN, URINE

## 2014-11-19 LAB — MAGNESIUM: Magnesium: 2.2 mg/dL (ref 1.7–2.4)

## 2014-11-19 NOTE — Progress Notes (Signed)
PULMONARY / CRITICAL CARE MEDICINE   Name: [name] MRN: [id number] DOB: 14-Dec-1955    ADMISSION DATE:  11/15/2014 CONSULTATION DATE:  11/16/2014  REFERRING MD : triad  CHIEF COMPLAINT:  sob  INITIAL PRESENTATION:  59 yo male former smoker with acute hypoxic/hypercapnic respiratory failure from CAP and pulmonary edema.  STUDIES:  7/15 Echo >> EF 35 to [id number], grade 1 diastolic dysfx, PAS 42 mmHg  SIGNIFICANT EVENTS: 7/15 tx to ICU for hypoxia /hypercarbia/hypotension 7/16 transfer to stepdown  SUBJECTIVE:  Sitting up , improved dyspnea  Continues to have minimal cough  ROS:  No subjective fever, chills, or sweats. No headache or vision changes. No chest pain or pressure. No nausea or vomiting.  VITAL SIGNS: Temp:  [98.7 F (37.1 C)-101 F (38.3 C)] 101 F (38.3 C) (07/18 0730) Pulse Rate:  [78-87] 87 (07/18 0741) Resp:  [20-35] 26 (07/18 0741) BP: (93-136)/(52-84) 136/81 mmHg (07/18 0741) SpO2:  [87 %-100 %] 92 % (07/18 0741) FiO2 (%):  [40 %] 40 % (07/18 0741) INTAKE / OUTPUT:  Intake/Output Summary (Last 24 hours) at 11/19/14 1120 Last data filed at 11/19/14 0500  Gross per 24 hour  Intake    360 ml  Output   1850 ml  Net  -1490 ml    PHYSICAL EXAMINATION Gen:  No distress. On venti mask Integument:  Warm & dry. No rash. Lymphatics:  No appreciable cervical or supraclavicular LAD. Cardiovascular:  Regular rate. No appreciable JVD. Pulmonary:  Clear to auscultation bilaterally. WOB okSpeaking in complete sentences.  Abdomen: Soft. Nontender. Normal bowel sounds. No edema  LABS:  CBC  Recent Labs Lab 11/17/14 0235 11/18/14 0228 11/19/14 0221  WBC 15.8* 13.9* 9.6  HGB 11.7* 10.1* 9.7*  HCT 37.1* 31.3* 30.9*  PLT 138* 125* 126*   Coag's  Recent Labs Lab 11/17/14 0235 11/18/14 0228 11/19/14 0221  INR 2.35* 2.94* 3.18*   BMET  Recent Labs Lab 11/17/14 0235 11/18/14 0228 11/19/14 0221  NA 138 137 140  K 3.9 4.2 4.3  CL 106 102 101   CO2 26 28 32  BUN 23* 29* 29*  CREATININE 1.90* 1.69* 1.50*  GLUCOSE 109* 109* 105*   Electrolytes  Recent Labs Lab 11/16/14 0913 11/17/14 0235 11/18/14 0228 11/19/14 0221  CALCIUM 8.2* 8.3* 8.2* 8.4*  MG 1.7 1.9  --  2.2  PHOS  --  2.8  --  2.6   Sepsis Markers  Recent Labs Lab 11/15/14 2150 11/16/14 0253  11/16/14 1016 11/17/14 0230 11/17/14 0235 11/18/14 0228  LATICACIDVEN 0.84 1.1  --   --  0.9  --   --   PROCALCITON  --   --   < > 28.55  --  24.57 17.15  < > = values in this interval not displayed. ABG  Recent Labs Lab 11/15/14 2351 11/17/14 0315  PHART 7.311* 7.354  PCO2ART 53.3* 43.3  PO2ART 104.0* 62.0*   Liver Enzymes  Recent Labs Lab 11/15/14 1800 11/19/14 0221  AST 34  --   ALT 27  --   ALKPHOS 71  --   BILITOT 1.2  --   ALBUMIN 3.9 2.5*   Cardiac Enzymes  Recent Labs Lab 11/16/14 0253 11/16/14 0913 11/16/14 1016  TROPONINI 0.09* 0.10* 0.10*   Glucose  Recent Labs Lab 11/17/14 2122 11/18/14 0744 11/18/14 1208 11/18/14 1629 11/18/14 2114 11/19/14 0738  GLUCAP 194* 93 100* 126* 131* 93    Imaging No results found. Cultures: Blood 7/14 >>ng Urine 7/15 >>neg Legionella  Ag 7/16 >> Pneumococcal Ag 7/16 >> Negative  ASSESSMENT / PLAN:  Acute on chronic hypoxic/hypercapnic respiratory failure 2nd to CAP and acute pulmonary edema. OSA/OHS. Secondary pulmonary HTN. Plan: - venti mask  to keep SpO2 90 to 95% - CPAP qhs -he needs outpt FU for new CPAP set up (his is 'broken') - goal negative fluid balance  Acute on chronic combined heart failure. Hx of A fib, CAD, HTN, HLD, CVA. S/p AICD. Plan: - cardiology following - continue amiodarone, lipitor, coreg, isordil - hold norvasc, altace, aldactone - coumadin per pharmacy - daily INR  Stage 2 CKD >> baseline creatinine 1.4. Acute renal failure. Plan: - trend UOP - trend daily BUN/Creatinine  CAP >> procalcitonin 30.59 from 7/15. Plan: - trend leukocyte  count - PCT algorithm - 7/14 >> rocephin, zithromax  DM type II. Hx of gout. Plan: - SSI qAC & HS - Lantus - hold metformin for now - allopurinol  Muscle spasms. Plan: - continue zanaflex   Summary - CAP + CHF, improving, transfer out of SDU once O2 requirements decrease  Kara Mead MD. FCCP. [facility] Pager [phone] If no response call [phone]

## 2014-11-19 NOTE — Progress Notes (Signed)
Pt request to come off cpap placed on 40 percent venturi mask at 8 liters from 87-91 percent will continue to monitor.

## 2014-11-19 NOTE — Progress Notes (Signed)
Patient Profile: This is a 59 y.o. 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 LVEF as low as 15% in the past, recently up to 25-30% in 01/2013. He is s/p [employer] single-chamber ICD (2007) which is followed by Dr. Cristopher Peru. He now presents with cough and progressive shortness of breath. He reported chills but no fever. He was found to have infiltrates on CXR with elevated WBC count. Tmax was 100.4. Troponin was mildly elevated on admission to 0.09, and has remained flat at 0.10, consistent with probable heart failure. INR remains therapeutic. Repeat echo was performed yesterday which shows LVEF has mildly improved to 35-40%, there is a persistent small pericardial effusion. Cardiology is asked to provide recommendations for CHF management.  Subjective: No complaints. Breathing improved.   Objective: Vital signs in last 24 hours: Temp:  [98.7 F (37.1 C)-99.6 F (37.6 C)] 98.8 F (37.1 C) (07/18 0250) Pulse Rate:  [78-86] 83 (07/18 0355) Resp:  [20-35] 24 (07/18 0355) BP: (93-130)/(52-84) 115/65 mmHg (07/18 0250) SpO2:  [87 %-100 %] 91 % (07/18 0355) FiO2 (%):  [40 %] 40 % (07/18 0355) Last BM Date: 11/15/14  Intake/Output from previous day: 07/17 0701 - 07/18 0700 In: 600 [P.O.:600] Out: 2125 [Urine:2125] Intake/Output this shift:    Medications Current Facility-Administered Medications  Medication Dose Route Frequency Provider Last Rate Last Dose  . acetaminophen (TYLENOL) tablet 650 mg  650 mg Oral Q6H PRN Anders Simmonds, MD   650 mg at 11/19/14 0754  . allopurinol (ZYLOPRIM) tablet 300 mg  300 mg Oral Daily Ivor Costa, MD   300 mg at 11/18/14 0942  . amiodarone (PACERONE) tablet 200 mg  200 mg Oral QHS Ivor Costa, MD   200 mg at 11/18/14 2052  . antiseptic oral rinse (BIOTENE)  solution 15 mL  15 mL Mouth Rinse BID Ivor Costa, MD   15 mL at 11/18/14 2000  . antiseptic oral rinse (CPC / CETYLPYRIDINIUM CHLORIDE 0.05%) solution 7 mL  7 mL Mouth Rinse BID Wilhelmina Mcardle, MD   7 mL at 11/18/14 2200  . atorvastatin (LIPITOR) tablet 80 mg  80 mg Oral QHS Ivor Costa, MD   80 mg at 11/18/14 2051  . azithromycin (ZITHROMAX) 500 mg in dextrose 5 % 250 mL IVPB  500 mg Intravenous Q24H Geronimo Boot, MD 250 mL/hr at 11/18/14 2000 500 mg at 11/18/14 2000  . carvedilol (COREG) tablet 25 mg  25 mg Oral BID Ivor Costa, MD   25 mg at 11/18/14 2052  . cefTRIAXone (ROCEPHIN) 1 g in dextrose 5 % 50 mL IVPB  1 g Intravenous Q24H Geronimo Boot, MD 100 mL/hr at 11/18/14 2049 1 g at 11/18/14 2049  . dextromethorphan-guaiFENesin (MUCINEX DM) 30-600 MG per 12 hr tablet 1 tablet  1 tablet Oral BID Ivor Costa, MD   1 tablet at 11/18/14 2052  . furosemide (LASIX) injection 40 mg  40 mg Intravenous BID Pixie Casino, MD   40 mg at 11/19/14 0756  . hydrALAZINE (APRESOLINE) injection 5 mg  5 mg Intravenous Q2H PRN Ivor Costa, MD      . insulin aspart (novoLOG) injection 0-9 Units  0-9 Units Subcutaneous TID WC Ivor Costa, MD   1 Units at 11/18/14 1655  . insulin glargine (LANTUS) injection 7 Units  7 Units Subcutaneous QHS Ivor Costa, MD  7 Units at 11/18/14 2053  . ipratropium-albuterol (DUONEB) 0.5-2.5 (3) MG/3ML nebulizer solution 3 mL  3 mL Nebulization Q4H PRN Ivor Costa, MD   3 mL at 11/16/14 1535  . isosorbide dinitrate (ISORDIL) tablet 20 mg  20 mg Oral TID Ivor Costa, MD   20 mg at 11/18/14 2052  . multivitamin with minerals tablet 1 tablet  1 tablet Oral Daily Ivor Costa, MD   1 tablet at 11/18/14 [id number]  . nitroGLYCERIN (NITROSTAT) SL tablet 0.4 mg  0.4 mg Sublingual Q5 Min x 3 PRN Ivor Costa, MD      . oxyCODONE-acetaminophen (PERCOCET/ROXICET) 5-325 MG per tablet 1-2 tablet  1-2 tablet Oral Q8H PRN Ivor Costa, MD      . tiZANidine (ZANAFLEX) tablet 4 mg  4 mg Oral Q6H PRN Ivor Costa, MD      .  Warfarin - Pharmacist Dosing Inpatient   Does not apply q1800 Kris Mouton, [facility]        PE: General appearance: alert, cooperative and no distress Neck: no carotid bruit and no JVD Lungs: faint bibasilar rales Heart: regular rate and rhythm, S1, S2 normal, no murmur, click, rub or gallop Extremities: no LEE Pulses: 2+ and symmetric Skin: warm and dry Neurologic: Grossly normal  Lab Results:   Recent Labs  11/17/14 0235 11/18/14 0228 11/19/14 0221  WBC 15.8* 13.9* 9.6  HGB 11.7* 10.1* 9.7*  HCT 37.1* 31.3* 30.9*  PLT 138* 125* 126*   BMET  Recent Labs  11/17/14 0235 11/18/14 0228 11/19/14 0221  NA 138 137 140  K 3.9 4.2 4.3  CL 106 102 101  CO2 26 28 32  GLUCOSE 109* 109* 105*  BUN 23* 29* 29*  CREATININE 1.90* 1.69* 1.50*  CALCIUM 8.3* 8.2* 8.4*   PT/INR  Recent Labs  11/17/14 0235 11/18/14 0228 11/19/14 0221  LABPROT 25.4* 30.1* 32.0*  INR 2.35* 2.94* 3.18*   Cardiac Panel (last 3 results)  Recent Labs  11/16/14 1016  TROPONINI 0.10*     Assessment/Plan  Principal Problem:   Severe sepsis with septic shock Active Problems:   Diabetes   Hyperlipidemia   Gout   Obstructive sleep apnea   Essential hypertension   Paroxysmal ventricular tachycardia   Chronic combined systolic and diastolic CHF (congestive heart failure)   History of cardiovascular disorder   Automatic implantable cardioverter-defibrillator in situ   Warfarin anticoagulation   PAF (paroxysmal atrial fibrillation)   Nonischemic cardiomyopathy   Sepsis   CAP (community acquired pneumonia)   Acute respiratory failure with hypoxia   Pneumonia   CKD (chronic kidney disease), stage II   Elevated troponin   1. CAP: on antibiotics. Management per primary.   2. Acute Combined Systolic + Diastolic CHF: diuresing well. -2.1L out in past 24 hrs. I/Os net negative 5 L total. Breathing improved. Faint basilar rales on exam, but volume overall improved. No LEE. Transition to PO  Lasix today. Low sodium diet.   3. PAF:  maintaining NSR on amiodarone and BB. On Warfarin for a/c. INR is slightly supratherapeutic at 3.18.   4. NICM: EF 35-40%. Continue BB. No ACE/ARB given CKD. BP stable. Has ICD.   5. Elevated Troponin: flat trend 0.09, 0.10. 0.10. Not consistent with ACS. Likely demand ischemia from CHF.   6. HTN: well controlled on current regimen.   7. HLD: on statin.   8. DM:  Management per primary.  9. CKD: Management per primary.     LOS: 4 days  Brittainy M. Ladoris Gene 11/19/2014 9:09 AM  Patient seen and examined and history reviewed. Agree with above findings and plan. Feeling better. Denies SOB. No recent edema. Diuresing well with IV lasix. I/O negative 5 liters since admission. Weight not recorded. Still requiring higher dose oxygen. Will continue IV diuresis today. Anticipate change to po tomorrow. Daily weight ordered.   Peter Martinique, [place] 11/19/2014 12:05 PM

## 2014-11-19 NOTE — Progress Notes (Signed)
UR COMPLETED  

## 2014-11-19 NOTE — Care Management Important Message (Signed)
Important Message  Patient Details  Name: [name] MRN: [id number] Date of Birth: Feb 17, 1956   Medicare Important Message Given:  Yes-second notification given    Delorse Lek 11/19/2014, 1:34 PM

## 2014-11-19 NOTE — Progress Notes (Signed)
Physical Therapy Treatment Patient Details Name: CHARLENE MURRIE MRN: [id number] DOB: [birth year] Today's Date: 11/19/2014    History of Present Illness 59 yo male former smoker with acute hypoxic/hypercapnic respiratory failure from CAP and pulmonary edema.    PT Comments    Progressing well, On venti mask at 40%.  He maintained mid 90's % during gait, but dropped once he sat down.   Follow Up Recommendations  Home health PT;Supervision/Assistance - 24 hour     Equipment Recommendations  None recommended by PT    Recommendations for Other Services       Precautions / Restrictions Precautions Precaution Comments: monitor O2 sats Restrictions Weight Bearing Restrictions: No    Mobility  Bed Mobility Overal bed mobility: Needs Assistance Bed Mobility: Supine to Sit;Sit to Supine     Supine to sit: Supervision Sit to supine: Supervision      Transfers Overall transfer level: Needs assistance Equipment used: Rolling walker (2 wheeled) Transfers: Sit to/from Stand Sit to Stand: Supervision            Ambulation/Gait Ambulation/Gait assistance: Supervision Ambulation Distance (Feet): 660 Feet Assistive device: Rolling walker (2 wheeled) Gait Pattern/deviations: Step-through pattern Gait velocity: slower   General Gait Details: steady, but unable to significantly increase speed on cue.   Stairs            Wheelchair Mobility    Modified Rankin (Stroke Patients Only)       Balance Overall balance assessment: Needs assistance Sitting-balance support: No upper extremity supported Sitting balance-Leahy Scale: Good       Standing balance-Leahy Scale: Fair                      Cognition Arousal/Alertness: Awake/alert Behavior During Therapy: WFL for tasks assessed/performed Overall Cognitive Status: Within Functional Limits for tasks assessed                      Exercises      General Comments General comments (skin  integrity, edema, etc.): During gait on 40% venturi mask sats maintained around 95%, but dropped into the lower to mid 80's once he sat.  pt's breathing still rather shallow      Pertinent Vitals/Pain Pain Assessment: No/denies pain    Home Living                      Prior Function            PT Goals (current goals can now be found in the care plan section) Acute Rehab PT Goals Patient Stated Goal: to go home PT Goal Formulation: With patient Time For Goal Achievement: 12/01/14 Potential to Achieve Goals: Good Progress towards PT goals: Progressing toward goals    Frequency  Min 3X/week    PT Plan Current plan remains appropriate    Co-evaluation             End of Session Equipment Utilized During Treatment: Oxygen Activity Tolerance: Patient tolerated treatment well Patient left: with call bell/phone within reach;in bed     Time: 1631-1650 PT Time Calculation (min) (ACUTE ONLY): 19 min  Charges:  $Gait Training: 8-22 mins                    G Codes:      Itsel Opfer, Tessie Fass 11/19/2014, 4:58 PM 11/19/2014  Donnella Sham, PT [phone]  (pager)

## 2014-11-19 NOTE — Progress Notes (Signed)
ANTICOAGULATION CONSULT NOTE - Follow Up Consult  Pharmacy Consult for warfarin Indication: afib/history of CVA  No Known Allergies  Patient Measurements: Height: 6' (182.9 cm) Weight: 264 lb 8.8 oz (120 kg) IBW/kg (Calculated) : 77.6  Vital Signs: Temp: 101 F (38.3 C) (07/18 0730) Temp Source: Axillary (07/18 0730) BP: 136/81 mmHg (07/18 0741) Pulse Rate: 87 (07/18 0741)  Labs:  Recent Labs  11/17/14 0235 11/18/14 0228 11/19/14 0221  HGB 11.7* 10.1* 9.7*  HCT 37.1* 31.3* 30.9*  PLT 138* 125* 126*  LABPROT 25.4* 30.1* 32.0*  INR 2.35* 2.94* 3.18*  CREATININE 1.90* 1.69* 1.50*    Estimated Creatinine Clearance: 71.8 mL/min (by C-G formula based on Cr of 1.5).  Assessment: 59 yo m on warfarin PTA for h/o CVA and afib.  PTA dose is 10 mg daily except 5 mg on MWF.  INR SUPRAtherapeutic today at 3.18, increased from 2.94. PLTC now stable at 126. Hgb down to 9.7. Weight = 120 kg.  Continues on amiodarone 200 mg po daily which he was taking PTA along with coumadin. On azithromycin IV since 11/15/14 which may increase hypoprothrombinemic effect of coumadin. Will hold dose of warfarin today due to SUPRAtherapeutic INR of 3.18 and drug interaction between azithromycin and warfarin, which will likely result further increased INR tomorrow.   Goal of Therapy:  INR 2-3 Monitor platelets by anticoagulation protocol: Yes   Plan:  No warfarin tonight as INR elevated and will likely increase tomorrow due to previous dosing.  F/u INR in AM Redose warfarin as needed Monitor hgb/plts, s/s of bleeding, clinical course   Nicoletta Ba, PharmD, BCPS Clinical Pharmacist [phone]

## 2014-11-20 LAB — GLUCOSE, CAPILLARY
GLUCOSE-CAPILLARY: 92 mg/dL (ref 65–99)
GLUCOSE-CAPILLARY: 175 mg/dL — AB (ref 65–99)
Glucose-Capillary: 170 mg/dL — ABNORMAL HIGH (ref 65–99)
Glucose-Capillary: 132 mg/dL — ABNORMAL HIGH (ref 65–99)

## 2014-11-20 LAB — PROTIME-INR
INR: 3.14 — AB (ref 0.00–1.49)
Prothrombin Time: 31.7 seconds — ABNORMAL HIGH (ref 11.6–15.2)

## 2014-11-20 LAB — BASIC METABOLIC PANEL
Anion gap: 6 (ref 5–15)
BUN: 26 mg/dL — ABNORMAL HIGH (ref 6–20)
CALCIUM: 8.3 mg/dL — AB (ref 8.9–10.3)
CO2: 32 mmol/L (ref 22–32)
Chloride: 99 mmol/L — ABNORMAL LOW (ref 101–111)
Creatinine, Ser: 1.31 mg/dL — ABNORMAL HIGH (ref 0.61–1.24)
GFR calc Af Amer: >60 mL/min (ref >60)
GFR, EST NON AFRICAN AMERICAN: 58 mL/min — AB (ref >60)
GLUCOSE: 90 mg/dL (ref 65–99)
Potassium: 4 mmol/L (ref 3.5–5.1)
Sodium: 137 mmol/L (ref 135–145)

## 2014-11-20 LAB — CULTURE, BLOOD (ROUTINE X 2)
CULTURE: NO GROWTH
Culture: NO GROWTH

## 2014-11-20 MED ORDER — DEXTROSE 5 % IV SOLN
500.0000 mg | INTRAVENOUS | Status: AC
  Administered 2014-11-20: 500 mg via INTRAVENOUS
  Filled 2014-11-20: qty 500

## 2014-11-20 MED ORDER — FUROSEMIDE 80 MG PO TABS
80.0000 mg | ORAL_TABLET | Freq: Every day | ORAL | Status: DC
  Administered 2014-11-21: 80 mg via ORAL
  Filled 2014-11-20: qty 1

## 2014-11-20 MED ORDER — WARFARIN SODIUM 5 MG PO TABS
5.0000 mg | ORAL_TABLET | Freq: Once | ORAL | Status: AC
Start: 2014-11-20 — End: 2014-11-20
  Administered 2014-11-20: 5 mg via ORAL
  Filled 2014-11-20: qty 1

## 2014-11-20 NOTE — Progress Notes (Signed)
   11/20/14 0615  Oxygen Therapy  SpO2 93 %  O2 Device Nasal Cannula  O2 Flow Rate (L/min) 4 L/min  Pulse Oximetry Type Continuous  pt oxygen decreased to 4 liters via nasal cannula saturations maintaining 93-94 percent this time

## 2014-11-20 NOTE — Progress Notes (Signed)
PULMONARY / CRITICAL CARE MEDICINE   Name: [name] MRN: [id number] DOB: 02-26-1956    ADMISSION DATE:  11/15/2014 CONSULTATION DATE:  11/16/2014  REFERRING MD : triad  CHIEF COMPLAINT:  sob  INITIAL PRESENTATION:  59 yo male former smoker with acute hypoxic/hypercapnic respiratory failure from CAP and pulmonary edema.  STUDIES:  7/15 Echo >> EF 35 to [id number], grade 1 diastolic dysfx, PAS 42 mmHg  SIGNIFICANT EVENTS: 7/15 tx to ICU for hypoxia /hypercarbia/hypotension 7/16 transfer to stepdown  SUBJECTIVE:  Sitting up , much improved dyspnea, on 6L [place]  C/o minimal cough  ROS:  No subjective fever, chills, or sweats. No headache or vision changes. No chest pain or pressure. No nausea or vomiting.  VITAL SIGNS: Temp:  [98.2 F (36.8 C)-98.8 F (37.1 C)] 98.8 F (37.1 C) (07/19 0730) Pulse Rate:  [67-79] 76 (07/19 0740) Resp:  [19-37] 19 (07/19 0740) BP: (102-125)/(62-80) 125/80 mmHg (07/19 0740) SpO2:  [83 %-100 %] 99 % (07/19 0740) Weight:  [263 lb 10.7 oz (119.6 kg)] 263 lb 10.7 oz (119.6 kg) (07/19 0255) INTAKE / OUTPUT:  Intake/Output Summary (Last 24 hours) at 11/20/14 1118 Last data filed at 11/20/14 0900  Gross per 24 hour  Intake    120 ml  Output   2130 ml  Net  -2010 ml    PHYSICAL EXAMINATION Gen:  No distress. On [place] Integument:  Warm & dry. No rash. Lymphatics:  No appreciable cervical or supraclavicular LAD. Cardiovascular:  Regular rate. No appreciable JVD. Pulmonary:  Clear to auscultation bilaterally. WOB okSpeaking in complete sentences.  Abdomen: Soft. Nontender. Normal bowel sounds. No edema  LABS:  CBC  Recent Labs Lab 11/17/14 0235 11/18/14 0228 11/19/14 0221  WBC 15.8* 13.9* 9.6  HGB 11.7* 10.1* 9.7*  HCT 37.1* 31.3* 30.9*  PLT 138* 125* 126*   Coag's  Recent Labs Lab 11/18/14 0228 11/19/14 0221 11/20/14 0240  INR 2.94* 3.18* 3.14*   BMET  Recent Labs Lab 11/18/14 0228 11/19/14 0221 11/20/14 0240  NA 137 140  137  K 4.2 4.3 4.0  CL 102 101 99*  CO2 28 32 32  BUN 29* 29* 26*  CREATININE 1.69* 1.50* 1.31*  GLUCOSE 109* 105* 90   Electrolytes  Recent Labs Lab 11/16/14 0913 11/17/14 0235 11/18/14 0228 11/19/14 0221 11/20/14 0240  CALCIUM 8.2* 8.3* 8.2* 8.4* 8.3*  MG 1.7 1.9  --  2.2  --   PHOS  --  2.8  --  2.6  --    Sepsis Markers  Recent Labs Lab 11/15/14 2150 11/16/14 0253  11/16/14 1016 11/17/14 0230 11/17/14 0235 11/18/14 0228  LATICACIDVEN 0.84 1.1  --   --  0.9  --   --   PROCALCITON  --   --   < > 28.55  --  24.57 17.15  < > = values in this interval not displayed. ABG  Recent Labs Lab 11/15/14 2351 11/17/14 0315  PHART 7.311* 7.354  PCO2ART 53.3* 43.3  PO2ART 104.0* 62.0*   Liver Enzymes  Recent Labs Lab 11/15/14 1800 11/19/14 0221  AST 34  --   ALT 27  --   ALKPHOS 71  --   BILITOT 1.2  --   ALBUMIN 3.9 2.5*   Cardiac Enzymes  Recent Labs Lab 11/16/14 0253 11/16/14 0913 11/16/14 1016  TROPONINI 0.09* 0.10* 0.10*   Glucose  Recent Labs Lab 11/18/14 2114 11/19/14 0738 11/19/14 1152 11/19/14 1654 11/19/14 2130 11/20/14 0742  GLUCAP 131* 93 146* 110*  180* 92    Imaging No results found. Cultures: Blood 7/14 >>ng Urine 7/15 >>neg Legionella Ag 7/16 >>neg Pneumococcal Ag 7/16 >> Negative  ASSESSMENT / PLAN:  Acute on chronic hypoxic/hypercapnic respiratory failure 2nd to CAP and acute pulmonary edema. OSA/OHS. Secondary pulmonary HTN. Plan: - venti mask  to keep SpO2 90 to 95% - CPAP qhs -he needs outpt FU for new CPAP set up (his is 'broken') - goal negative fluid balance  Acute on chronic combined heart failure. Hx of A fib, CAD, HTN, HLD, CVA. S/p AICD. Plan: - cardiology following - continue amiodarone, lipitor, coreg, isordil - hold norvasc, altace, aldactone - coumadin per pharmacy - daily INR  Stage 2 CKD >> baseline creatinine 1.4. Acute renal failure. Plan: - trend UOP - trend daily  BUN/Creatinine  CAP >> procalcitonin 30.59 from 7/15. Plan:  - PCT algorithm -  rocephin 7/14 >> Dc zithromax  DM type II. Hx of gout. Plan: - SSI qAC & HS - Lantus - hold metformin for now - allopurinol  Muscle spasms. Plan: - continue zanaflex   Summary - CAP + CHF, improving, transfer out of SDU now that O2 requirements decrease  Kara Mead MD. FCCP. [facility] Pager [phone] If no response call [phone]

## 2014-11-20 NOTE — Progress Notes (Signed)
ANTICOAGULATION CONSULT NOTE - Follow Up Consult  Pharmacy Consult for warfarin Indication: afib/history of CVA  No Known Allergies  Patient Measurements: Height: 6' (182.9 cm) Weight: 263 lb 10.7 oz (119.6 kg) IBW/kg (Calculated) : 77.6  Vital Signs: Temp: 98.8 F (37.1 C) (07/19 0730) Temp Source: Oral (07/19 0730) BP: 109/71 mmHg (07/19 0255) Pulse Rate: 79 (07/19 0640)  Labs:  Recent Labs  11/18/14 0228 11/19/14 0221 11/20/14 0240  HGB 10.1* 9.7*  --   HCT 31.3* 30.9*  --   PLT 125* 126*  --   LABPROT 30.1* 32.0* 31.7*  INR 2.94* 3.18* 3.14*  CREATININE 1.69* 1.50* 1.31*    Estimated Creatinine Clearance: 82.1 mL/min (by C-G formula based on Cr of 1.31).  Assessment: 59 yo m on warfarin PTA for h/o CVA and afib.  PTA dose is 10 mg daily except 5 mg on MWF.  INR remains slightly SUPRAtherapeutic today at 3.14.  Dose was held yesterday Goal of Therapy:  INR 2-3 Monitor platelets by anticoagulation protocol: Yes   Plan:  Coumadin 5 mg po today F/u INR in AM Monitor hgb/plts, s/s of bleeding, clinical course  Thanks for allowing pharmacy to be a part of this patient's care.  Excell Seltzer, PharmD Clinical Pharmacist, [phone]

## 2014-11-20 NOTE — Progress Notes (Signed)
Patient Profile: This is a 59 y.o. 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 LVEF as low as 15% in the past, recently up to 25-30% in 01/2013. He is s/p St. Jude single-chamber ICD (2007) which is followed by Dr. Cristopher Peru. He now presents with cough and progressive shortness of breath. He reported chills but no fever. He was found to have infiltrates on CXR with elevated WBC count. Tmax was 100.4. Troponin was mildly elevated on admission to 0.09, and has remained flat at 0.10, consistent with probable heart failure. INR remains therapeutic. Repeat echo was performed yesterday which shows LVEF has mildly improved to 35-40%, there is a persistent small pericardial effusion. Cardiology is asked to provide recommendations for CHF management.  Subjective: Still requiring supplemental O2. States he did not do well when [place] was reduced to 4L. He is now back up to 5.5L. No chest pain. Still with nonproductive cough.   Objective: Vital signs in last 24 hours: Temp:  [98.2 F (36.8 C)-99.4 F (37.4 C)] 98.8 F (37.1 C) (07/19 0730) Pulse Rate:  [67-79] 79 (07/19 0640) Resp:  [19-37] 19 (07/19 0640) BP: (94-124)/(59-71) 109/71 mmHg (07/19 0255) SpO2:  [83 %-100 %] 96 % (07/19 0640) FiO2 (%):  [40 %] 40 % (07/18 1054) Weight:  [263 lb 10.7 oz (119.6 kg)] 263 lb 10.7 oz (119.6 kg) (07/19 0255) Last BM Date: 11/15/14  Intake/Output from previous day: 07/18 0701 - 07/19 0700 In: 120 [P.O.:120] Out: 1530 [Urine:1530] Intake/Output this shift: Total I/O In: -  Out: 225 [Urine:225]  Medications Current Facility-Administered Medications  Medication Dose Route Frequency Provider Last Rate Last Dose  . acetaminophen (TYLENOL) tablet 650 mg  650 mg Oral Q6H PRN Anders Simmonds, MD   650 mg at 11/19/14  0754  . allopurinol (ZYLOPRIM) tablet 300 mg  300 mg Oral Daily Ivor Costa, MD   300 mg at 11/19/14 0914  . amiodarone (PACERONE) tablet 200 mg  200 mg Oral QHS Ivor Costa, MD   200 mg at 11/19/14 2105  . antiseptic oral rinse (BIOTENE) solution 15 mL  15 mL Mouth Rinse BID Ivor Costa, MD   15 mL at 11/19/14 0915  . antiseptic oral rinse (CPC / CETYLPYRIDINIUM CHLORIDE 0.05%) solution 7 mL  7 mL Mouth Rinse BID Wilhelmina Mcardle, MD   7 mL at 11/18/14 2200  . atorvastatin (LIPITOR) tablet 80 mg  80 mg Oral QHS Ivor Costa, MD   80 mg at 11/19/14 2105  . azithromycin (ZITHROMAX) 500 mg in dextrose 5 % 250 mL IVPB  500 mg Intravenous Q24H Geronimo Boot, MD 250 mL/hr at 11/19/14 1952 500 mg at 11/19/14 1952  . carvedilol (COREG) tablet 25 mg  25 mg Oral BID Ivor Costa, MD   25 mg at 11/19/14 2105  . cefTRIAXone (ROCEPHIN) 1 g in dextrose 5 % 50 mL IVPB  1 g Intravenous Q24H Geronimo Boot, MD 100 mL/hr at 11/19/14 2100 1 g at 11/19/14 2100  . dextromethorphan-guaiFENesin (MUCINEX DM) 30-600 MG per 12 hr tablet 1 tablet  1 tablet Oral BID Ivor Costa, MD   1 tablet at 11/19/14 2105  . furosemide (LASIX) injection 40 mg  40 mg Intravenous BID Pixie Casino, MD   40 mg at 11/19/14 1731  . hydrALAZINE (APRESOLINE) injection 5 mg  5 mg Intravenous Q2H PRN  Ivor Costa, MD      . insulin aspart (novoLOG) injection 0-9 Units  0-9 Units Subcutaneous TID WC Ivor Costa, MD   1 Units at 11/19/14 1309  . insulin glargine (LANTUS) injection 7 Units  7 Units Subcutaneous QHS Ivor Costa, MD   7 Units at 11/19/14 2200  . ipratropium-albuterol (DUONEB) 0.5-2.5 (3) MG/3ML nebulizer solution 3 mL  3 mL Nebulization Q4H PRN Ivor Costa, MD   3 mL at 11/16/14 1535  . isosorbide dinitrate (ISORDIL) tablet 20 mg  20 mg Oral TID Ivor Costa, MD   20 mg at 11/19/14 2104  . multivitamin with minerals tablet 1 tablet  1 tablet Oral Daily Ivor Costa, MD   1 tablet at 11/19/14 0914  . nitroGLYCERIN (NITROSTAT) SL tablet 0.4 mg  0.4 mg Sublingual  Q5 Min x 3 PRN Ivor Costa, MD      . oxyCODONE-acetaminophen (PERCOCET/ROXICET) 5-325 MG per tablet 1-2 tablet  1-2 tablet Oral Q8H PRN Ivor Costa, MD      . tiZANidine (ZANAFLEX) tablet 4 mg  4 mg Oral Q6H PRN Ivor Costa, MD      . warfarin (COUMADIN) tablet 5 mg  5 mg Oral ONCE-1800 Wilhelmina Mcardle, MD      . Warfarin - Pharmacist Dosing Inpatient   Does not apply q1800 Kris Mouton, [facility]        PE: General appearance: alert, cooperative and no distress Neck: no carotid bruit and no JVD Lungs: faint bibasilar rales Heart: regular rate and rhythm, S1, S2 normal, no murmur, click, rub or gallop Extremities: no LEE Pulses: 2+ and symmetric Skin: warm and dry Neurologic: Grossly normal  Lab Results:   Recent Labs  11/18/14 0228 11/19/14 0221  WBC 13.9* 9.6  HGB 10.1* 9.7*  HCT 31.3* 30.9*  PLT 125* 126*   BMET  Recent Labs  11/18/14 0228 11/19/14 0221 11/20/14 0240  NA 137 140 137  K 4.2 4.3 4.0  CL 102 101 99*  CO2 28 32 32  GLUCOSE 109* 105* 90  BUN 29* 29* 26*  CREATININE 1.69* 1.50* 1.31*  CALCIUM 8.2* 8.4* 8.3*   PT/INR  Recent Labs  11/18/14 0228 11/19/14 0221 11/20/14 0240  LABPROT 30.1* 32.0* 31.7*  INR 2.94* 3.18* 3.14*   Cardiac Panel (last 3 results) No results for input(s): CKTOTAL, CKMB, TROPONINI, RELINDX in the last 72 hours.   Assessment/Plan  Principal Problem:   Severe sepsis with septic shock Active Problems:   Diabetes   Hyperlipidemia   Gout   Obstructive sleep apnea   Essential hypertension   Paroxysmal ventricular tachycardia   Chronic combined systolic and diastolic CHF (congestive heart failure)   History of cardiovascular disorder   Automatic implantable cardioverter-defibrillator in situ   Warfarin anticoagulation   PAF (paroxysmal atrial fibrillation)   Nonischemic cardiomyopathy   Sepsis   CAP (community acquired pneumonia)   Acute respiratory failure with hypoxia   Pneumonia   CKD (chronic kidney disease), stage  II   Elevated troponin   Acute on chronic combined systolic and diastolic ACC/AHA stage C congestive heart failure   1. CAP: on antibiotics. Management per primary.   2. Acute Combined Systolic + Diastolic CHF: diuresing well. -1.5L out in past 24 hrs. I/Os net negative 6.8 L total. Breathing improved but still with high O2 requirements. Now on 5.5 L [place]. He did not tolerate 4L well (became hypoxic with O2 sats dropping into the 80s).  Appears close to euvolemic state. Continued  dyspnea likely multifactorial and assume CAP is contributing. Continue Lasix and Coreg (denies any wheezing).     3. PAF:  maintaining NSR on amiodarone and BB. On Warfarin for a/c. INR is slightly supratherapeutic at 3.14.   4. NICM: EF 35-40%. Continue BB. No ACE/ARB given CKD. BP stable. Has ICD.   5. Elevated Troponin: flat trend 0.09, 0.10. 0.10. Not consistent with ACS. Likely demand ischemia from CHF.   6. HTN: well controlled on current regimen.   7. HLD: on statin.   8. DM:  Management per primary.  9. CKD: Management per primary.     LOS: 5 days    Brittainy M. Rosita Fire, PA-C 11/20/2014 9:00 AM Patient seen and examined and history reviewed. Agree with above findings and plan. Breathing is better. No SOB. Oxygen requirement improving but still on 6l [place]. He is below baseline weight by about 4 lbs. No edema. Will transition to po lasix today.  Peter Martinique, [place] 11/20/2014 9:42 AM

## 2014-11-20 NOTE — Progress Notes (Signed)
   11/20/14 0630  Oxygen Therapy  SpO2 (!) 83 %  O2 Device Nasal Cannula  O2 Flow Rate (L/min) 4 L/min  Pulse Oximetry Type Continuous  pt not tolerating 4 liters via nasal cannula increased oxygen back to 6 liters

## 2014-11-20 NOTE — Progress Notes (Signed)
   11/20/14 0255  Oxygen Therapy  SpO2 95 %  O2 Device Nasal Cannula  O2 Flow Rate (L/min) 6 L/min  Pulse Oximetry Type Continuous  pt wanted to take off cpap pt placed on 6 liters via nasal cannula maitaining sats at this time

## 2014-11-21 DIAGNOSIS — E119 Type 2 diabetes mellitus without complications: Secondary | ICD-10-CM

## 2014-11-21 DIAGNOSIS — G4733 Obstructive sleep apnea (adult) (pediatric): Secondary | ICD-10-CM | POA: Diagnosis present

## 2014-11-21 LAB — BASIC METABOLIC PANEL
Anion gap: 7 (ref 5–15)
BUN: 24 mg/dL — ABNORMAL HIGH (ref 6–20)
CALCIUM: 8.6 mg/dL — AB (ref 8.9–10.3)
CHLORIDE: 101 mmol/L (ref 101–111)
CO2: 33 mmol/L — ABNORMAL HIGH (ref 22–32)
Creatinine, Ser: 1.27 mg/dL — ABNORMAL HIGH (ref 0.61–1.24)
GFR calc Af Amer: >60 mL/min (ref >60)
GFR calc non Af Amer: >60 mL/min (ref >60)
Glucose, Bld: 104 mg/dL — ABNORMAL HIGH (ref 65–99)
Potassium: 3.7 mmol/L (ref 3.5–5.1)
Sodium: 141 mmol/L (ref 135–145)

## 2014-11-21 LAB — PROTIME-INR
INR: 2.72 — AB (ref 0.00–1.49)
PROTHROMBIN TIME: 28.4 s — AB (ref 11.6–15.2)

## 2014-11-21 LAB — GLUCOSE, CAPILLARY
Glucose-Capillary: 89 mg/dL (ref 65–99)
Glucose-Capillary: 147 mg/dL — ABNORMAL HIGH (ref 65–99)

## 2014-11-21 MED ORDER — WARFARIN SODIUM 5 MG PO TABS: 5.0000 mg | ORAL_TABLET | Freq: Once | ORAL | Status: DC

## 2014-11-21 NOTE — Progress Notes (Signed)
Placed patient on CPAP for the night.  Patient tolerating well at this time.

## 2014-11-21 NOTE — Care Management Note (Signed)
Case Management Note  Patient Details  Name: [name] MRN: [id number] Date of Birth: [birth year]  Subjective/Objective:                 Patient from home with spouse. Will require home O2 at discharge. Patient chose to have [facility] supply oxygen, as they have supplied his CPAP in the past. Patient stated that his CPAP was broke. [name], hospital liaison with [facility] will deliver O2 prior to discharge to room and instruct patient on how to obtain new CPAP through [facility]. [facility] notified of [name] PT needs.    Action/Plan:  Discharge to home with new O2, and HH PT.  Expected Discharge Date:                  Expected Discharge Plan:  [place]  In-House Referral:  Clinical Social Work  Discharge planning Services  CM Consult  Post Acute Care Choice:  Durable Medical Equipment, Home Health Choice offered to:  Patient  DME Arranged:  Oxygen DME Agency:  [place]:  PT [place]:  [place]  Status of Service:  Completed, signed off  Medicare Important Message Given:  Yes-second notification given Date Medicare IM Given:    Medicare IM give by:    Date Additional Medicare IM Given:    Additional Medicare Important Message give by:     If discussed at [place] of Stay Meetings, dates discussed:    Additional Comments:  Carles Collet, RN 11/21/2014, 11:00 AM

## 2014-11-21 NOTE — Progress Notes (Signed)
ANTICOAGULATION CONSULT NOTE - Follow Up Consult  Pharmacy Consult for warfarin Indication: afib/history of CVA  No Known Allergies  Patient Measurements: Height: 6' (182.9 cm) Weight: 267 lb 6.7 oz (121.3 kg) IBW/kg (Calculated) : 77.6  Vital Signs: Temp: 98.4 F (36.9 C) (07/20 0557) Temp Source: Oral (07/20 0557) BP: 122/79 mmHg (07/20 0557) Pulse Rate: 75 (07/20 0557)  Labs:  Recent Labs  11/19/14 0221 11/20/14 0240 11/21/14 0400  HGB 9.7*  --   --   HCT 30.9*  --   --   PLT 126*  --   --   LABPROT 32.0* 31.7* 28.4*  INR 3.18* 3.14* 2.72*  CREATININE 1.50* 1.31* 1.27*    Estimated Creatinine Clearance: 85.3 mL/min (by C-G formula based on Cr of 1.27).  Assessment: 59 yo m on warfarin PTA for h/o CVA and afib.  PTA dose is 10 mg daily except 5 mg on MWF.  INR is therapeutic today.  Dose was held on 7/19  Goal of Therapy:  INR 2-3 Monitor platelets by anticoagulation protocol: Yes   Plan:  Coumadin 5 mg po today F/u INR in AM Monitor hgb/plts, s/s of bleeding, clinical course  Albertina Parr, PharmD., BCPS Clinical Pharmacist Pager [phone]

## 2014-11-21 NOTE — Progress Notes (Signed)
PT Cancellation Note  Patient Details Name: [name] MRN: [id number] DOB: Jan 10, 1956   Cancelled Treatment:    Reason Eval/Treat Not Completed: Other (comment) (Pt just amb in hallway with nursing.)   [facility] 11/21/2014, 10:47 AM  [place]

## 2014-11-21 NOTE — Progress Notes (Signed)
Patient Profile: This is a 59 y.o. 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 LVEF as low as 15% in the past, recently up to 25-30% in 01/2013. He is s/p St. Jude single-chamber ICD (2007) which is followed by Dr. Cristopher Peru. He now presents with cough and progressive shortness of breath. He reported chills but no fever. He was found to have infiltrates on CXR with elevated WBC count. Tmax was 100.4. Troponin was mildly elevated on admission to 0.09, and has remained flat at 0.10, consistent with probable heart failure. INR remains therapeutic. Repeat echo was performed yesterday which shows LVEF has mildly improved to 35-40%, there is a persistent small pericardial effusion. Cardiology is asked to provide recommendations for CHF management.  Subjective: Still requiring supplemental O2 but requirements have decreased. On 2 liters now.  No chest pain. Breathing doing OK.  Objective: Vital signs in last 24 hours: Temp:  [98.4 F (36.9 C)-99.2 F (37.3 C)] 98.4 F (36.9 C) (07/20 0557) Pulse Rate:  [75-81] 75 (07/20 0557) Resp:  [18-23] 18 (07/20 0557) BP: (113-122)/(72-79) 122/79 mmHg (07/20 0557) SpO2:  [93 %-99 %] 93 % (07/20 0900) Weight:  [121.3 kg (267 lb 6.7 oz)] 121.3 kg (267 lb 6.7 oz) (07/20 0554) Last BM Date: 11/15/14  Intake/Output from previous day: 07/19 0701 - 07/20 0700 In: 87 [P.O.:420] Out: 1575 [Urine:1575] Intake/Output this shift: Total I/O In: 240 [P.O.:240] Out: 250 [Urine:250]  Medications Current Facility-Administered Medications  Medication Dose Route Frequency Provider Last Rate Last Dose  . acetaminophen (TYLENOL) tablet 650 mg  650 mg Oral Q6H PRN Anders Simmonds, MD   650 mg at 11/19/14 0754  . allopurinol (ZYLOPRIM) tablet 300 mg  300 mg Oral Daily Ivor Costa, MD   300 mg at 11/21/14 1022  . amiodarone (PACERONE) tablet 200 mg  200 mg Oral QHS Ivor Costa, MD   200 mg at 11/20/14 2141  . antiseptic oral rinse (BIOTENE) solution 15 mL  15 mL Mouth Rinse BID Ivor Costa, MD   15 mL at 11/20/14 2035  . antiseptic oral rinse (CPC / CETYLPYRIDINIUM CHLORIDE 0.05%) solution 7 mL  7 mL Mouth Rinse BID Wilhelmina Mcardle, MD   7 mL at 11/21/14 1023  . atorvastatin (LIPITOR) tablet 80 mg  80 mg Oral QHS Ivor Costa, MD   80 mg at 11/20/14 2141  . carvedilol (COREG) tablet 25 mg  25 mg Oral BID Ivor Costa, MD   25 mg at 11/21/14 1022  . cefTRIAXone (ROCEPHIN) 1 g in dextrose 5 % 50 mL IVPB  1 g Intravenous Q24H Geronimo Boot, MD 100 mL/hr at 11/20/14 2100 1 g at 11/20/14 2100  . dextromethorphan-guaiFENesin (MUCINEX DM) 30-600 MG per 12 hr tablet 1 tablet  1 tablet Oral BID Ivor Costa, MD   1 tablet at 11/21/14 1022  . furosemide (LASIX) tablet 80 mg  80 mg Oral Daily Kaislee Chao M Martinique, MD   80 mg at 11/21/14 1022  . hydrALAZINE (APRESOLINE) injection 5 mg  5 mg Intravenous Q2H PRN Ivor Costa, MD      . insulin aspart (novoLOG) injection 0-9 Units  0-9 Units Subcutaneous TID WC Ivor Costa, MD   1 Units at 11/20/14 1753  . insulin glargine (LANTUS) injection 7 Units  7 Units Subcutaneous QHS Ivor Costa, MD   7 Units at  11/20/14 2142  . ipratropium-albuterol (DUONEB) 0.5-2.5 (3) MG/3ML nebulizer solution 3 mL  3 mL Nebulization Q4H PRN Ivor Costa, MD   3 mL at 11/16/14 1535  . isosorbide dinitrate (ISORDIL) tablet 20 mg  20 mg Oral TID Ivor Costa, MD   20 mg at 11/21/14 1022  . multivitamin with minerals tablet 1 tablet  1 tablet Oral Daily Ivor Costa, MD   1 tablet at 11/21/14 1022  . nitroGLYCERIN (NITROSTAT) SL tablet 0.4 mg  0.4 mg Sublingual Q5 Min x 3 PRN Ivor Costa, MD      . oxyCODONE-acetaminophen (PERCOCET/ROXICET) 5-325 MG per tablet 1-2 tablet  1-2 tablet Oral Q8H PRN Ivor Costa, MD      . tiZANidine (ZANAFLEX) tablet 4 mg  4 mg Oral Q6H PRN Ivor Costa, MD      .  warfarin (COUMADIN) tablet 5 mg  5 mg Oral ONCE-1800 Darnell Level Mancheril, RPH      . Warfarin - Pharmacist Dosing Inpatient   Does not apply q1800 Kris Mouton, [facility]        PE: General appearance: alert, cooperative and no distress Neck: no carotid bruit and no JVD Lungs: faint bibasilar rales Heart: regular rate and rhythm, S1, S2 normal, no murmur, click, rub or gallop Extremities: no LEE Pulses: 2+ and symmetric Skin: warm and dry Neurologic: Grossly normal  Lab Results:   Recent Labs  11/19/14 0221  WBC 9.6  HGB 9.7*  HCT 30.9*  PLT 126*   BMET  Recent Labs  11/19/14 0221 11/20/14 0240 11/21/14 0400  NA 140 137 141  K 4.3 4.0 3.7  CL 101 99* 101  CO2 32 32 33*  GLUCOSE 105* 90 104*  BUN 29* 26* 24*  CREATININE 1.50* 1.31* 1.27*  CALCIUM 8.4* 8.3* 8.6*   PT/INR  Recent Labs  11/19/14 0221 11/20/14 0240 11/21/14 0400  LABPROT 32.0* 31.7* 28.4*  INR 3.18* 3.14* 2.72*   Cardiac Panel (last 3 results) No results for input(s): CKTOTAL, CKMB, TROPONINI, RELINDX in the last 72 hours.   Assessment/Plan  Principal Problem:   Severe sepsis with septic shock Active Problems:   Diabetes   Hyperlipidemia   Gout   Obstructive sleep apnea   Essential hypertension   Paroxysmal ventricular tachycardia   Chronic combined systolic and diastolic CHF (congestive heart failure)   History of cardiovascular disorder   Automatic implantable cardioverter-defibrillator in situ   Warfarin anticoagulation   PAF (paroxysmal atrial fibrillation)   Nonischemic cardiomyopathy   Sepsis   CAP (community acquired pneumonia)   Acute respiratory failure with hypoxia   Pneumonia   CKD (chronic kidney disease), stage II   Elevated troponin   Acute on chronic combined systolic and diastolic ACC/AHA stage C congestive heart failure   1. CAP: on antibiotics. Management per primary.   2. Acute Combined Systolic + Diastolic CHF: diuresing well.  Appears to be euvolemic.  Weight is at baseline and he is now on usual oral lasix dose. Continued dyspnea likely multifactorial and assume CAP is contributing. Continue Lasix and Coreg (denies any wheezing).     3. PAF:  maintaining NSR on amiodarone and BB. On Warfarin for a/c. INR is therapeutic at 2.72   4. NICM: EF 35-40%. Continue BB. No ACE/ARB given CKD. BP stable. Has ICD.   5. Elevated Troponin: flat trend 0.09, 0.10. 0.10. Not consistent with ACS. Likely demand ischemia from CHF.   6. HTN: well controlled on current regimen.   7. HLD: on  statin.   8. DM:  Management per primary.  9. CKD: Management per primary.   Patient is stable for DC from a cardiac standpoint. Will arrange follow up in a couple of weeks in our office.    LOS: 6 days     Javan Gonzaga Martinique, [place] 11/21/2014 11:09 AM

## 2014-11-21 NOTE — Care Management Important Message (Signed)
Important Message  Patient Details  Name: LYNK LABERGE MRN: [id number] Date of Birth: 06/15/1955   Medicare Important Message Given:  Yes-third notification given    Nathen May 11/21/2014, 11:24 AMImportant Message  Patient Details  Name: JANES STALLWORTH MRN: [id number] Date of Birth: [birth year]   Medicare Important Message Given:  Yes-third notification given    Nathen May 11/21/2014, 11:24 AM

## 2014-11-21 NOTE — Progress Notes (Signed)
Pt verbalized understanding of discharge instruction. Pt IV dc with no complications. Pt education given  Pt instructed when to follow up with physician and when their follow up appointments are. Pt dc with all belongings. Gabrella Stroh N Nirali Magouirk, RN 

## 2014-11-21 NOTE — Discharge Summary (Signed)
Physician Discharge Summary  [name] [id number] DOB: November 29, 1955 DOA: 11/15/2014  PCP: Cathlean Cower, MD  Admit date: 11/15/2014 Discharge date: 11/21/2014  Time spent: 35 minutes  Recommendations for Outpatient Follow-up:  1. Discharge home with outpatient PCP and cardiology follow-up in 1-2 weeks. 2. Patient will be discharged home on home O2 (2 L via nasal cannula on ambulation) 3. Ramipril and metformin have been held due to acute on chronic kidney disease and his renal function stable can be resumed as outpatient 4. Needs sleep study for new CPAP set up.  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   Hyperlipidemia   Gout   Obstructive sleep apnea   Essential hypertension   Paroxysmal ventricular tachycardia   History of cardiovascular disorder   Automatic implantable cardioverter-defibrillator in situ   Warfarin anticoagulation   PAF (paroxysmal atrial fibrillation)   Nonischemic cardiomyopathy   CAP (community acquired pneumonia)   CKD (chronic kidney disease), stage II   Elevated troponin   Type 2 diabetes mellitus   OSA (obstructive sleep apnea)   Discharge Condition: Fair  Diet recommendation: Heart healthy/diabetic  Filed Weights   11/15/14 2300 11/20/14 0255 11/21/14 0554  Weight: 120 kg (264 lb 8.8 oz) 119.6 kg (263 lb 10.7 oz) 121.3 kg (267 lb 6.7 oz)    History of present illness:  Please refer to admission H&P for details, in brief, 59 year old obese male with history of hypertension, hyperlipidemia, type 2 diabetes mellitus, gout, chronic combined systolic and diastolic CHF with EF [id number], chronic kidney disease stage II, coronary artery disease, paroxysmal A. fib on Coumadin, AICD, obstructive sleep apnea not on CPAP, history of stroke, pulmonary hypertension who  presented with pneumonia and sepsis and admitted to stepdown but subsequently went into hypotensive shock requiring pressors. He was under care of pulmonary and critical care and cardiology. 2-D echo repeated showed EF of 35-40% with grade 1 diastolic dysfunction and pulmonary artery pressure of 42 mmHg. Patient placed on empiric Rocephin and azithromycin for his pneumonia. Patient showed clinical improvement and was transferred to telemetry on 7/19.  Hospital Course:  Severe sepsis with septic shock Secondary to underlying community-acquired pneumonia and acute pulmonary edema Patient history patient Ventimask and then transitioned to nasal cannula but required almost 5-6 L of oxygen despite adequate diuresis. Patient has diabetes well with IV Lasix and his neck negative of about 8 L since admission and below his dry  weight. Patient has completed 6 days of IV antibiotics and remains afebrile with stable blood pressure. Cultures have been negative.  Acute hypoxic respiratory failure Secondary to pneumonia and acute pulmonary edema. Symptoms much improved with antibiotics and diuresis. O2 sat stable on room air but desaturates to 86% on ambulation. Will arrange home O2 with ambulation ( 2 L via nasal cannula)  Acute on chronic combined systolic and diastolic CHF Diuresing with IV Lasix. Resume home dose Lasix, aspirin, carvedilol, hydralazine, Imdur and Aldactone. Continue statin. i have held Altace due to some worsening of his underlying renal disease. If renal function improved or stable during outpatient follow-up ACE inhibitor should be resumed. Notified cardiology office to arrange outpatient follow-up with his cardiologist in 2 weeks.  Acute on chronic stage II CKD Slowly improved to baseline. i am holding his ACEi and metformin until his renal function is checked regarding  outpatient follow-up in 1 week and if stable both can be resumed.   Paroxysmal A. fib On Coumadin. INR therapeutic  this morning. Resume home dose. Continue amiodarone and beta blocker.  Type 2 diabetes mellitus Continue home dose insulin. Resume metformin if renal function stable as outpatient.  OSA/OHS with secondary pulmonary hypertension Patient was placed on CPAP during that time. Needs outpatient follow-up appointment for new CPAP set up  History of gout Continue allopurinol.  History of muscle spasms Continue Zanaflex.     Inpatient is clinically stable and can be discharged home with outpatient PCP and cardiology follow-up.  Procedures:  20 echo  Consultations:  Cardiology   PCCM  Discharge Exam: Filed Vitals:   11/21/14 0557  BP: 122/79  Pulse: 75  Temp: 98.4 F (36.9 C)  Resp: 18    General: Lasered obese male in no acute distress HEENT: No pallor, moist oral mucosa, supple neck, no JVD Chest: Fine bibasilar crackles, no rhonchi or wheeze CVS: Normal S1 and S2, no murmurs rub or gallop GI: Soft, nondistended, nontender, bowel sounds present X Lummus": Warm, no edema CNS: Alert and oriented    Discharge Instructions    Current Discharge Medication List    CONTINUE these medications which have NOT CHANGED   Details  allopurinol (ZYLOPRIM) 300 MG tablet TAKE 1 TABLET (300 MG TOTAL) BY MOUTH DAILY. Qty: 90 tablet, Refills: 1    amiodarone (PACERONE) 200 MG tablet TAKE 1 TABLET (200 MG TOTAL) BY MOUTH DAILY. Qty: 90 tablet, Refills: 1    amLODipine (NORVASC) 10 MG tablet TAKE 1 TABLET BY MOUTH DAILY Qty: 90 tablet, Refills: 3    atorvastatin (LIPITOR) 80 MG tablet Take 1 tablet (80 mg total) by mouth daily. Qty: 90 tablet, Refills: 3    carvedilol (COREG) 25 MG tablet TAKE 2 TABLETS (50 MG TOTAL) BY MOUTH 2 (TWO) TIMES DAILY WITH A MEAL. Qty: 360 tablet, Refills: 3    furosemide (LASIX) 80 MG tablet TAKE 1 TABLET (80 MG TOTAL) BY MOUTH DAILY. Qty: 90 tablet, Refills: 4    hydrALAZINE (APRESOLINE) 50 MG tablet TAKE 1 TABLET (50 MG TOTAL) BY MOUTH EVERY 8  (EIGHT) HOURS. Qty: 270 tablet, Refills: 1    Insulin Glargine (LANTUS SOLOSTAR) 100 UNIT/ML Solostar Pen Inject 10 Units into the skin at bedtime. Qty: 30 mL, Refills: 11    isosorbide dinitrate (ISORDIL) 20 MG tablet TAKE 1 TABLET (20 MG TOTAL) BY MOUTH 3 (THREE) TIMES DAILY. Qty: 270 tablet, Refills: 1    KLOR-CON M20 20 MEQ tablet TAKE 2 TABLETS (40 MEQ TOTAL) BY MOUTH DAILY. Qty: 180 tablet, Refills: 3    Multiple Vitamin (MULTIVITAMIN) capsule Take 1 capsule by mouth daily.     nitroGLYCERIN (NITROSTAT) 0.4 MG SL tablet Place 1 tablet (0.4 mg total) under the tongue every 5 (five) minutes x 3 doses as needed for chest pain. Qty: 25 tablet, Refills: 2    spironolactone (ALDACTONE) 25 MG tablet TAKE 1 TABLET (25 MG TOTAL) BY MOUTH DAILY. Qty: 90 tablet, Refills: 3    warfarin (COUMADIN) 5 MG tablet Take as directed by anticoagulation clinic Qty: 180 tablet, Refills: 1      STOP taking these medications     metFORMIN (GLUCOPHAGE) 500 MG tablet      ramipril (ALTACE) 10 MG capsule      oxyCODONE-acetaminophen (PERCOCET/ROXICET) 5-325 MG per tablet      tiZANidine (ZANAFLEX) 4 MG tablet        No Known Allergies  Follow-up Information    Follow up with [facility].   Why:  for PT. Will call 24-48 hours after discharge to set up first home visit.   Contact information:   [address] [place] [address] [phone]       Follow up with [facility].   Why:  HOme oxygen set up. Will deliver oxygen to room prior to discharge, and instruct on how to replace home CPAP.    Contact information:   [address] [place] [address] [phone]       Follow up with Cathlean Cower, MD. Schedule an appointment as soon as possible for a visit in 1 week.   Specialties:  Internal Medicine, Radiology   Contact information:   [place] [address] [phone]       Follow up with Minus Breeding, MD.  Schedule an appointment as soon as possible for a visit in 2 weeks.   Specialty:  Cardiology   Contact information:   [address] [place] [address] [phone]        The results of significant diagnostics from this hospitalization (including imaging, microbiology, ancillary and laboratory) are listed below for reference.    Significant Diagnostic Studies: Dg Chest Port 1 View  11/18/2014   CLINICAL DATA:  Community acquired pneumonia  EXAM: PORTABLE CHEST - 1 VIEW  COMPARISON:  11/17/2014  FINDINGS: There is a left chest wall ICD with lead in the right ventricle. Moderate cardiac enlargement persistent bilateral airspace opacities, not significantly changed from previous exam  IMPRESSION: 1. No change in aeration a lungs compared with previous exam.   Electronically Signed   By: Kerby Moors M.D.   On: 11/18/2014 08:30   Dg Chest Port 1 View  11/17/2014   CLINICAL DATA:  Respiratory failure.  EXAM: PORTABLE CHEST - 1 VIEW  COMPARISON:  11/16/2014  FINDINGS: Left-sided AICD lead to the right ventricle. The heart is enlarged. There are dense bilateral pulmonary opacities, increased since the previous exam.  IMPRESSION: Increased opacity of bilateral pulmonary infiltrates.   Electronically Signed   By: Nolon Nations M.D.   On: 11/17/2014 07:23   Dg Chest Port 1 View  11/16/2014   CLINICAL DATA:  Acute respiratory failure with hypoxia. Chronic combined systolic and diastolic congestive heart failure.  EXAM: PORTABLE CHEST - 1 VIEW  COMPARISON:  11/15/2014  FINDINGS: Bilateral diffuse pulmonary airspace disease shows mild improvement since previous study. Mild cardiomegaly stable. AICD remains in appropriate position. No pneumothorax visualized.  IMPRESSION: Mild improvement diffuse bilateral pulmonary airspace disease. Stable mild cardiomegaly.   Electronically Signed   By: Earle Gell M.D.   On: 11/16/2014 08:00   Dg Chest Port 1 View  11/15/2014   CLINICAL DATA:   Shortness of breath, on BiPAP  EXAM: PORTABLE CHEST - 1 VIEW  COMPARISON:  12/29/2012  FINDINGS: Multifocal patchy opacities in the right lung and possibly in the left suprahilar region. This appearance favors multifocal pneumonia, less likely interstitial edema.  No pleural effusion or pneumothorax.  Cardiomegaly.  Left subclavian ICD.  IMPRESSION: Multifocal patchy opacities, right lung predominant, suspicious for multifocal pneumonia.  Interstitial edema is considered less likely.   Electronically Signed   By: Julian Hy M.D.   On: 11/15/2014 18:19    Microbiology: Recent Results (from the past 240 hour(s))  Culture, blood (routine x 2)     Status: None  Collection Time: 11/15/14  7:02 PM  Result Value Ref Range Status   Specimen Description BLOOD RIGHT ARM  Final   Special Requests BOTTLES DRAWN AEROBIC AND ANAEROBIC 10CC EACH  Final   Culture NO GROWTH 5 DAYS  Final   Report Status 11/20/2014 FINAL  Final  Culture, blood (routine x 2)     Status: None   Collection Time: 11/15/14  7:50 PM  Result Value Ref Range Status   Specimen Description BLOOD RIGHT ARM  Final   Special Requests BOTTLES DRAWN AEROBIC AND ANAEROBIC 5CC EACH  Final   Culture NO GROWTH 5 DAYS  Final   Report Status 11/20/2014 FINAL  Final  MRSA PCR Screening     Status: None   Collection Time: 11/15/14 10:54 PM  Result Value Ref Range Status   MRSA by PCR NEGATIVE NEGATIVE Final    Comment:        The GeneXpert MRSA Assay (FDA approved for NASAL specimens only), is one component of a comprehensive MRSA colonization surveillance program. It is not intended to diagnose MRSA infection nor to guide or monitor treatment for MRSA infections.   Culture, Urine     Status: None   Collection Time: 11/16/14 10:42 AM  Result Value Ref Range Status   Specimen Description URINE, CLEAN CATCH  Final   Special Requests NONE  Final   Culture   Final    MULTIPLE SPECIES PRESENT, SUGGEST RECOLLECTION IF CLINICALLY  INDICATED   Report Status 11/18/2014 FINAL  Final  Culture, blood (routine x 2)     Status: None (Preliminary result)   Collection Time: 11/17/14  8:03 PM  Result Value Ref Range Status   Specimen Description BLOOD RIGHT ANTECUBITAL  Final   Special Requests BOTTLES DRAWN AEROBIC AND ANAEROBIC 10CC EACH  Final   Culture NO GROWTH 3 DAYS  Final   Report Status PENDING  Incomplete  Culture, blood (routine x 2)     Status: None (Preliminary result)   Collection Time: 11/17/14  8:10 PM  Result Value Ref Range Status   Specimen Description BLOOD RIGHT HAND  Final   Special Requests BOTTLES DRAWN AEROBIC ONLY 10CC  Final   Culture NO GROWTH 3 DAYS  Final   Report Status PENDING  Incomplete     Labs: Basic Metabolic Panel:  Recent Labs Lab 11/16/14 0913 11/17/14 0235 11/18/14 0228 11/19/14 0221 11/20/14 0240 11/21/14 0400  NA 140 138 137 140 137 141  K 3.9 3.9 4.2 4.3 4.0 3.7  CL 109 106 102 101 99* 101  CO2 25 26 28  32 32 33*  GLUCOSE 97 109* 109* 105* 90 104*  BUN 23* 23* 29* 29* 26* 24*  CREATININE 1.92* 1.90* 1.69* 1.50* 1.31* 1.27*  CALCIUM 8.2* 8.3* 8.2* 8.4* 8.3* 8.6*  MG 1.7 1.9  --  2.2  --   --   PHOS  --  2.8  --  2.6  --   --    Liver Function Tests:  Recent Labs Lab 11/15/14 1800 11/19/14 0221  AST 34  --   ALT 27  --   ALKPHOS 71  --   BILITOT 1.2  --   PROT 7.5  --   ALBUMIN 3.9 2.5*   No results for input(s): LIPASE, AMYLASE in the last 168 hours. No results for input(s): AMMONIA in the last 168 hours. CBC:  Recent Labs Lab 11/15/14 1800 11/16/14 0913 11/17/14 0235 11/18/14 0228 11/19/14 0221  WBC 12.2* 18.5* 15.8* 13.9* 9.6  NEUTROABS  --  16.6*  --   --  8.4*  HGB 14.1 11.2* 11.7* 10.1* 9.7*  HCT 44.2 34.6* 37.1* 31.3* 30.9*  MCV 98.4 98.3 98.4 99.1 97.8  PLT 205 163 138* 125* 126*   Cardiac Enzymes:  Recent Labs Lab 11/15/14 1800 11/16/14 0253 11/16/14 0913 11/16/14 1016  TROPONINI 0.09* 0.09* 0.10* 0.10*   BNP: BNP (last 3  results)  Recent Labs  11/15/14 1800 11/18/14 0228  BNP 248.3* 309.7*    ProBNP (last 3 results) No results for input(s): PROBNP in the last 8760 hours.  CBG:  Recent Labs Lab 11/20/14 0742 11/20/14 1158 11/20/14 1703 11/20/14 2222 11/21/14 0758  GLUCAP 92 170* 132* 175* 89       Signed:  Taylour Lietzke  [facility] 11/21/2014, 11:26 AM

## 2014-11-21 NOTE — Progress Notes (Signed)
SATURATION QUALIFICATIONS: (This note is used to comply with regulatory documentation for home oxygen)  Patient Saturations on Room Air at Rest = 92%  Patient Saturations on Room Air while Ambulating = 86%  Patient Saturations on 2 Liters of oxygen while Ambulating = 92%  Please briefly explain why patient needs home oxygen: Patients oxygen drops to 86% while ambulating  Franchot Erichsen, RN

## 2014-11-21 NOTE — Discharge Instructions (Signed)

## 2014-11-22 DIAGNOSIS — Z95810 Presence of automatic (implantable) cardiac defibrillator: Secondary | ICD-10-CM | POA: Diagnosis not present

## 2014-11-22 DIAGNOSIS — I129 Hypertensive chronic kidney disease with stage 1 through stage 4 chronic kidney disease, or unspecified chronic kidney disease: Secondary | ICD-10-CM | POA: Diagnosis not present

## 2014-11-22 DIAGNOSIS — I5043 Acute on chronic combined systolic (congestive) and diastolic (congestive) heart failure: Secondary | ICD-10-CM | POA: Diagnosis not present

## 2014-11-22 DIAGNOSIS — E785 Hyperlipidemia, unspecified: Secondary | ICD-10-CM | POA: Diagnosis not present

## 2014-11-22 DIAGNOSIS — Z7901 Long term (current) use of anticoagulants: Secondary | ICD-10-CM | POA: Diagnosis not present

## 2014-11-22 DIAGNOSIS — N182 Chronic kidney disease, stage 2 (mild): Secondary | ICD-10-CM | POA: Diagnosis not present

## 2014-11-22 DIAGNOSIS — G4733 Obstructive sleep apnea (adult) (pediatric): Secondary | ICD-10-CM | POA: Diagnosis not present

## 2014-11-22 DIAGNOSIS — E1129 Type 2 diabetes mellitus with other diabetic kidney complication: Secondary | ICD-10-CM | POA: Diagnosis not present

## 2014-11-22 DIAGNOSIS — I480 Paroxysmal atrial fibrillation: Secondary | ICD-10-CM | POA: Diagnosis not present

## 2014-11-22 DIAGNOSIS — Z794 Long term (current) use of insulin: Secondary | ICD-10-CM | POA: Diagnosis not present

## 2014-11-22 LAB — CULTURE, BLOOD (ROUTINE X 2)
Culture: NO GROWTH
Culture: NO GROWTH

## 2014-11-26 DIAGNOSIS — G4733 Obstructive sleep apnea (adult) (pediatric): Secondary | ICD-10-CM | POA: Diagnosis not present

## 2014-11-26 DIAGNOSIS — Z794 Long term (current) use of insulin: Secondary | ICD-10-CM | POA: Diagnosis not present

## 2014-11-26 DIAGNOSIS — E1129 Type 2 diabetes mellitus with other diabetic kidney complication: Secondary | ICD-10-CM | POA: Diagnosis not present

## 2014-11-26 DIAGNOSIS — Z95810 Presence of automatic (implantable) cardiac defibrillator: Secondary | ICD-10-CM | POA: Diagnosis not present

## 2014-11-26 DIAGNOSIS — I129 Hypertensive chronic kidney disease with stage 1 through stage 4 chronic kidney disease, or unspecified chronic kidney disease: Secondary | ICD-10-CM | POA: Diagnosis not present

## 2014-11-26 DIAGNOSIS — E785 Hyperlipidemia, unspecified: Secondary | ICD-10-CM | POA: Diagnosis not present

## 2014-11-26 DIAGNOSIS — Z7901 Long term (current) use of anticoagulants: Secondary | ICD-10-CM | POA: Diagnosis not present

## 2014-11-26 DIAGNOSIS — I480 Paroxysmal atrial fibrillation: Secondary | ICD-10-CM | POA: Diagnosis not present

## 2014-11-26 DIAGNOSIS — I5043 Acute on chronic combined systolic (congestive) and diastolic (congestive) heart failure: Secondary | ICD-10-CM | POA: Diagnosis not present

## 2014-11-26 DIAGNOSIS — N182 Chronic kidney disease, stage 2 (mild): Secondary | ICD-10-CM | POA: Diagnosis not present

## 2014-11-27 ENCOUNTER — Encounter: Payer: Self-pay | Admitting: Internal Medicine

## 2014-11-27 ENCOUNTER — Other Ambulatory Visit (INDEPENDENT_AMBULATORY_CARE_PROVIDER_SITE_OTHER): Payer: Medicare Other

## 2014-11-27 ENCOUNTER — Ambulatory Visit (INDEPENDENT_AMBULATORY_CARE_PROVIDER_SITE_OTHER): Payer: Medicare Other | Admitting: Internal Medicine

## 2014-11-27 VITALS — BP 118/76 | HR 73 | Temp 98.2°F | Wt 260.0 lb

## 2014-11-27 DIAGNOSIS — J189 Pneumonia, unspecified organism: Secondary | ICD-10-CM | POA: Diagnosis not present

## 2014-11-27 DIAGNOSIS — J9601 Acute respiratory failure with hypoxia: Secondary | ICD-10-CM

## 2014-11-27 DIAGNOSIS — I10 Essential (primary) hypertension: Secondary | ICD-10-CM | POA: Diagnosis not present

## 2014-11-27 DIAGNOSIS — N182 Chronic kidney disease, stage 2 (mild): Secondary | ICD-10-CM

## 2014-11-27 DIAGNOSIS — E119 Type 2 diabetes mellitus without complications: Secondary | ICD-10-CM

## 2014-11-27 NOTE — Assessment & Plan Note (Signed)
For cr today, if < 1.5 would be ok to re-start metformin,  Lab Results  Component Value Date   HGBA1C 5.2 03/23/2014

## 2014-11-27 NOTE — Assessment & Plan Note (Signed)
With persistent borderline hypoxia though improved over hosp d/c, OK to cont Home o2 2L for now, likely I suspect to be able to d/c relatively soon

## 2014-11-27 NOTE — Progress Notes (Signed)
Pre visit review using our clinic review tool, if applicable. No additional management support is needed unless otherwise documented below in the visit note. 

## 2014-11-27 NOTE — Assessment & Plan Note (Signed)
With acute recent worsening, o/w stable overall by history and exam, recent data reviewed with pt, and pt to continue medical treatment as before,  to f/u any worsening symptoms or concerns Lab Results  Component Value Date   WBC 9.6 11/19/2014   HGB 9.7* 11/19/2014   HCT 30.9* 11/19/2014   PLT 126* 11/19/2014   GLUCOSE 104* 11/21/2014   CHOL 152 03/23/2014   TRIG 70.0 03/23/2014   HDL 33.00* 03/23/2014   LDLDIRECT 194.6 05/09/2007   LDLCALC 105* 03/23/2014   ALT 27 11/15/2014   AST 34 11/15/2014   NA 141 11/21/2014   K 3.7 11/21/2014   CL 101 11/21/2014   CREATININE 1.27* 11/21/2014   BUN 24* 11/21/2014   CO2 33* 11/21/2014   TSH 1.21 03/23/2014   PSA 0.52 03/23/2014   INR 2.72* 11/21/2014   HGBA1C 5.2 03/23/2014   MICROALBUR 1.9 03/23/2014   For f/u lab today

## 2014-11-27 NOTE — Assessment & Plan Note (Signed)
Clinically resolved, ok to follow clinically , will hold on further tx or imaging

## 2014-11-27 NOTE — Assessment & Plan Note (Signed)
Ok to stay off the ramipril at this time due to persistent lower BP, f/u BP at home and next visit BP Readings from Last 3 Encounters:  11/27/14 118/76  11/21/14 122/79  09/25/14 130/84

## 2014-11-27 NOTE — Patient Instructions (Addendum)
Your ambulatory o2 sat on RA was:  90-91%  You can also take Mucinex (or it's generic off brand) for congestion, and tylenol as needed for pain.  Please continue all other medications as before, and refills have been done if requested.  Please have the pharmacy call with any other refills you may need.  You will be contacted regarding the referral for: pulmonary for sleep apnea follow up  Please keep your appointments with your specialists as you may have planned - cardiology  Please go to the LAB in the Basement (turn left off the elevator) for the tests to be done today  You will be contacted by phone if any changes need to be made immediately.  Otherwise, you will receive a letter about your results with an explanation, but please check with MyChart first.  Please remember to sign up for MyChart if you have not done so, as this will be important to you in the future with finding out test results, communicating by private email, and scheduling acute appointments online when needed.

## 2014-11-27 NOTE — Progress Notes (Signed)
Subjective:    Patient ID: [name], male    DOB: 21-Jul-1955, 59 y.o.   MRN: [id number]  HPI  Here to fu recent hospn with bilat pna, acute resp hypoxic failure, septic shock, AKI, now off ramipril and metformin, on Home o2 2L continuous, needs sleep study eval, and has appt for cardiology f/u.  Will need lab work f/u today. Pt denies chest pain, worsening prod cough, increased sob or doe, wheezing, orthopnea, PND, increased LE swelling, palpitations, dizziness or syncope. Pt denies new neurological symptoms such as new headache, or facial or extremity weakness or numbness   Pt denies polydipsia, polyuria.  Does also have right ear eustachian symptoms with popping, and reduced hearing, without pain, Better hearing with valsalva, then worse later. Past Medical History  Diagnosis Date  . Chronic systolic CHF (congestive heart failure)     a. Likely NICM (out of proportion to CAD). b. 2009 - EF 25-30% by echo, 01/2013: 15-20% by cath. c. s/p St. Jude ICD implantation 2007.  [name] Gout   . Hypokalemia   . HTN (hypertension)   . Lipoma   . Dyslipidemia   . CAD (coronary artery disease)     a. Initial nonobst by cath 2009. b. Cath 01/2013 in setting of VT storm: obstructive distal Cx disease (small and terminates in the AV groove, unlikely to cause significant ischemia or electrical instability), nonobstructive RCA disease, EF 15-20%.   . Atrial fibrillation     a. Noted 05/2008 by EKG.  . Paroxysmal VT     a. s/p St. Jude ICD 2007. b. H/o paroxysmal VT/VF including VT storm 12/2012 admission prompting amio initiation.  . Nonischemic cardiomyopathy   . ICD (implantable cardiac defibrillator) in place   . OSA (obstructive sleep apnea)     does not wear cpap  . DM (diabetes mellitus)     insulin dependent  . Cerebrovascular accident     a. 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 Pulmonary HTN     a. Mild by cath 01/2013.  . CKD (chronic kidney disease), stage II    Past Surgical History    Procedure Laterality Date  . Cardiac catheterization      Nonobstructive coronary disease 2009  . Cardiac defibrillator placement      ICD-St. Jude  . Lipoma excision    . Left and right heart catheterization with coronary angiogram N/A 01/03/2013    Procedure: LEFT AND RIGHT HEART CATHETERIZATION WITH CORONARY ANGIOGRAM;  Surgeon: Peter M Martinique, MD;  Location: [facility] CATH LAB;  Service: Cardiovascular;  Laterality: N/A;  . Implantable cardioverter defibrillator (icd) generator change N/A 01/15/2014    Procedure: ICD GENERATOR CHANGE;  Surgeon: Evans Lance, MD;  Location: [facility] CATH LAB;  Service: Cardiovascular;  Laterality: N/A;    reports that he quit smoking about 28 years ago. He has never used smokeless tobacco. He reports that he does not drink alcohol or use illicit drugs. family history includes Coronary artery disease in his mother. No Known Allergies Current Outpatient Prescriptions on File Prior to Visit  Medication Sig Dispense Refill  . allopurinol (ZYLOPRIM) 300 MG tablet TAKE 1 TABLET (300 MG TOTAL) BY MOUTH DAILY. 90 tablet 1  . amiodarone (PACERONE) 200 MG tablet TAKE 1 TABLET (200 MG TOTAL) BY MOUTH DAILY. (Patient taking differently: TAKE 1 TABLET (200 MG TOTAL) BY MOUTH DAILY AT BEDTIME) 90 tablet 1  . amLODipine (NORVASC) 10 MG tablet TAKE 1 TABLET BY MOUTH DAILY 90 tablet 3  .  atorvastatin (LIPITOR) 80 MG tablet Take 1 tablet (80 mg total) by mouth daily. (Patient taking differently: Take 80 mg by mouth daily at 6 PM. ) 90 tablet 3  . carvedilol (COREG) 25 MG tablet TAKE 2 TABLETS (50 MG TOTAL) BY MOUTH 2 (TWO) TIMES DAILY WITH A MEAL. 360 tablet 3  . furosemide (LASIX) 80 MG tablet TAKE 1 TABLET (80 MG TOTAL) BY MOUTH DAILY. 90 tablet 4  . hydrALAZINE (APRESOLINE) 50 MG tablet TAKE 1 TABLET (50 MG TOTAL) BY MOUTH EVERY 8 (EIGHT) HOURS. 270 tablet 1  . Insulin Glargine (LANTUS SOLOSTAR) 100 UNIT/ML Solostar Pen Inject 10 Units into the skin at bedtime. 30 mL 11  .  isosorbide dinitrate (ISORDIL) 20 MG tablet TAKE 1 TABLET (20 MG TOTAL) BY MOUTH 3 (THREE) TIMES DAILY. 270 tablet 1  . KLOR-CON M20 20 MEQ tablet TAKE 2 TABLETS (40 MEQ TOTAL) BY MOUTH DAILY. 180 tablet 3  . Multiple Vitamin (MULTIVITAMIN) capsule Take 1 capsule by mouth daily.     . nitroGLYCERIN (NITROSTAT) 0.4 MG SL tablet Place 1 tablet (0.4 mg total) under the tongue every 5 (five) minutes x 3 doses as needed for chest pain. 25 tablet 2  . spironolactone (ALDACTONE) 25 MG tablet TAKE 1 TABLET (25 MG TOTAL) BY MOUTH DAILY. 90 tablet 3  . warfarin (COUMADIN) 5 MG tablet Take as directed by anticoagulation clinic (Patient taking differently: Take 5-10 mg by mouth at bedtime. TAKES 5MG  ON MON, WED AND FRI  TAKES 10MG  ALL OTHER DAYS) 180 tablet 1   No current facility-administered medications on file prior to visit.   Review of Systems  Constitutional: Negative for unusual diaphoresis or night sweats HENT: Negative for ringing in ear or discharge Eyes: Negative for double vision or worsening visual disturbance.  Respiratory: Negative for choking and stridor.   Gastrointestinal: Negative for vomiting or other signifcant bowel change Genitourinary: Negative for hematuria or change in urine volume.   Musculoskeletal: Negative for other MSK pain or swelling Skin: Negative for color change and worsening wound.  Neurological: Negative for tremors and numbness other than noted  Psychiatric/Behavioral: Negative for decreased concentration or agitation other than above       Objective:   Physical Exam BP 118/76 mmHg  Pulse 73  Temp(Src) 98.2 F (36.8 C)  Wt 260 lb (117.935 kg)  SpO2 91% sitting at rest off o2 (96% on oxygen prior); Ambulatory RA sat: VS noted,  Constitutional: Pt appears in no significant distress HENT: Head: NCAT.  Right Ear: External ear normal.  Left Ear: External ear normal. right TM with mild post effusion Eyes: . Pupils are equal, round, and reactive to light.  Conjunctivae and EOM are normal Neck: Normal range of motion. Neck supple.  Cardiovascular: Normal rate and regular rhythm.   Pulmonary/Chest: Effort normal and breath sounds with rare Right base rales , no wheezing.  Abd:  Soft, NT, ND, + BS Neurological: Pt is alert. Not confused , motor grossly intact Skin: Skin is warm. No rash, no LE edema Psychiatric: Pt behavior is normal. No agitation.     Assessment & Plan:

## 2014-11-28 ENCOUNTER — Encounter: Payer: Self-pay | Admitting: *Deleted

## 2014-11-28 ENCOUNTER — Other Ambulatory Visit: Payer: Self-pay | Admitting: Internal Medicine

## 2014-11-28 DIAGNOSIS — Z794 Long term (current) use of insulin: Secondary | ICD-10-CM | POA: Diagnosis not present

## 2014-11-28 DIAGNOSIS — I129 Hypertensive chronic kidney disease with stage 1 through stage 4 chronic kidney disease, or unspecified chronic kidney disease: Secondary | ICD-10-CM | POA: Diagnosis not present

## 2014-11-28 DIAGNOSIS — E785 Hyperlipidemia, unspecified: Secondary | ICD-10-CM | POA: Diagnosis not present

## 2014-11-28 DIAGNOSIS — I5043 Acute on chronic combined systolic (congestive) and diastolic (congestive) heart failure: Secondary | ICD-10-CM | POA: Diagnosis not present

## 2014-11-28 DIAGNOSIS — E1129 Type 2 diabetes mellitus with other diabetic kidney complication: Secondary | ICD-10-CM | POA: Diagnosis not present

## 2014-11-28 DIAGNOSIS — Z95810 Presence of automatic (implantable) cardiac defibrillator: Secondary | ICD-10-CM | POA: Diagnosis not present

## 2014-11-28 DIAGNOSIS — N182 Chronic kidney disease, stage 2 (mild): Secondary | ICD-10-CM | POA: Diagnosis not present

## 2014-11-28 DIAGNOSIS — Z7901 Long term (current) use of anticoagulants: Secondary | ICD-10-CM | POA: Diagnosis not present

## 2014-11-28 DIAGNOSIS — G4733 Obstructive sleep apnea (adult) (pediatric): Secondary | ICD-10-CM | POA: Diagnosis not present

## 2014-11-28 DIAGNOSIS — N183 Chronic kidney disease, stage 3 (moderate): Secondary | ICD-10-CM

## 2014-11-28 DIAGNOSIS — I480 Paroxysmal atrial fibrillation: Secondary | ICD-10-CM | POA: Diagnosis not present

## 2014-11-28 LAB — BASIC METABOLIC PANEL
BUN: 21 mg/dL (ref 6–23)
CALCIUM: 9.1 mg/dL (ref 8.4–10.5)
CHLORIDE: 106 meq/L (ref 96–112)
CO2: 30 meq/L (ref 19–32)
Creatinine, Ser: 1.74 mg/dL — ABNORMAL HIGH (ref 0.40–1.50)
GFR: 51.98 mL/min — AB (ref >60.00)
GLUCOSE: 106 mg/dL — AB (ref 70–99)
POTASSIUM: 4.5 meq/L (ref 3.5–5.1)
SODIUM: 144 meq/L (ref 135–145)

## 2014-11-30 ENCOUNTER — Encounter: Payer: Self-pay | Admitting: Internal Medicine

## 2014-11-30 DIAGNOSIS — I5043 Acute on chronic combined systolic (congestive) and diastolic (congestive) heart failure: Secondary | ICD-10-CM | POA: Diagnosis not present

## 2014-11-30 DIAGNOSIS — G4733 Obstructive sleep apnea (adult) (pediatric): Secondary | ICD-10-CM | POA: Diagnosis not present

## 2014-11-30 DIAGNOSIS — I129 Hypertensive chronic kidney disease with stage 1 through stage 4 chronic kidney disease, or unspecified chronic kidney disease: Secondary | ICD-10-CM | POA: Diagnosis not present

## 2014-11-30 DIAGNOSIS — Z95810 Presence of automatic (implantable) cardiac defibrillator: Secondary | ICD-10-CM | POA: Diagnosis not present

## 2014-11-30 DIAGNOSIS — Z7901 Long term (current) use of anticoagulants: Secondary | ICD-10-CM | POA: Diagnosis not present

## 2014-11-30 DIAGNOSIS — E785 Hyperlipidemia, unspecified: Secondary | ICD-10-CM | POA: Diagnosis not present

## 2014-11-30 DIAGNOSIS — Z794 Long term (current) use of insulin: Secondary | ICD-10-CM | POA: Diagnosis not present

## 2014-11-30 DIAGNOSIS — I480 Paroxysmal atrial fibrillation: Secondary | ICD-10-CM | POA: Diagnosis not present

## 2014-11-30 DIAGNOSIS — E1129 Type 2 diabetes mellitus with other diabetic kidney complication: Secondary | ICD-10-CM | POA: Diagnosis not present

## 2014-11-30 DIAGNOSIS — N182 Chronic kidney disease, stage 2 (mild): Secondary | ICD-10-CM | POA: Diagnosis not present

## 2014-12-04 DIAGNOSIS — Z794 Long term (current) use of insulin: Secondary | ICD-10-CM | POA: Diagnosis not present

## 2014-12-04 DIAGNOSIS — I5043 Acute on chronic combined systolic (congestive) and diastolic (congestive) heart failure: Secondary | ICD-10-CM | POA: Diagnosis not present

## 2014-12-04 DIAGNOSIS — I480 Paroxysmal atrial fibrillation: Secondary | ICD-10-CM | POA: Diagnosis not present

## 2014-12-04 DIAGNOSIS — E785 Hyperlipidemia, unspecified: Secondary | ICD-10-CM | POA: Diagnosis not present

## 2014-12-04 DIAGNOSIS — Z95810 Presence of automatic (implantable) cardiac defibrillator: Secondary | ICD-10-CM | POA: Diagnosis not present

## 2014-12-04 DIAGNOSIS — G4733 Obstructive sleep apnea (adult) (pediatric): Secondary | ICD-10-CM | POA: Diagnosis not present

## 2014-12-04 DIAGNOSIS — Z7901 Long term (current) use of anticoagulants: Secondary | ICD-10-CM | POA: Diagnosis not present

## 2014-12-04 DIAGNOSIS — E1129 Type 2 diabetes mellitus with other diabetic kidney complication: Secondary | ICD-10-CM | POA: Diagnosis not present

## 2014-12-04 DIAGNOSIS — N182 Chronic kidney disease, stage 2 (mild): Secondary | ICD-10-CM | POA: Diagnosis not present

## 2014-12-04 DIAGNOSIS — I129 Hypertensive chronic kidney disease with stage 1 through stage 4 chronic kidney disease, or unspecified chronic kidney disease: Secondary | ICD-10-CM | POA: Diagnosis not present

## 2014-12-05 ENCOUNTER — Other Ambulatory Visit: Payer: Self-pay

## 2014-12-05 ENCOUNTER — Ambulatory Visit (INDEPENDENT_AMBULATORY_CARE_PROVIDER_SITE_OTHER): Payer: Medicare Other | Admitting: Nurse Practitioner

## 2014-12-05 ENCOUNTER — Encounter: Payer: Self-pay | Admitting: Nurse Practitioner

## 2014-12-05 VITALS — BP 92/62 | HR 71 | Ht 72.0 in | Wt 260.0 lb

## 2014-12-05 DIAGNOSIS — I429 Cardiomyopathy, unspecified: Secondary | ICD-10-CM

## 2014-12-05 DIAGNOSIS — I10 Essential (primary) hypertension: Secondary | ICD-10-CM

## 2014-12-05 DIAGNOSIS — I5022 Chronic systolic (congestive) heart failure: Secondary | ICD-10-CM | POA: Insufficient documentation

## 2014-12-05 DIAGNOSIS — I428 Other cardiomyopathies: Secondary | ICD-10-CM

## 2014-12-05 DIAGNOSIS — I480 Paroxysmal atrial fibrillation: Secondary | ICD-10-CM

## 2014-12-05 DIAGNOSIS — I5042 Chronic combined systolic (congestive) and diastolic (congestive) heart failure: Secondary | ICD-10-CM | POA: Diagnosis not present

## 2014-12-05 DIAGNOSIS — N182 Chronic kidney disease, stage 2 (mild): Secondary | ICD-10-CM

## 2014-12-05 DIAGNOSIS — I5023 Acute on chronic systolic (congestive) heart failure: Secondary | ICD-10-CM

## 2014-12-05 DIAGNOSIS — E785 Hyperlipidemia, unspecified: Secondary | ICD-10-CM

## 2014-12-05 NOTE — Progress Notes (Signed)
Patient Name: [name] Date of Encounter: 12/05/2014  Primary Care Provider:  Cathlean Cower, MD Primary Cardiologist:  J. Hochrein, MD / G. Lovena Le, MD   Chief Complaint   59 year old male with a history of nonischemic cardiopathy presents for follow-up after recent hospitalization for sepsis and mild troponin elevation.  Past Medical History   Past Medical History  Diagnosis Date  . Chronic systolic CHF (congestive heart failure)     a. Likely NICM (out of proportion to CAD). b. 2009 - EF 25-30% by echo, 01/2013: 15-20% by cath. c. 11/2014 Echo: EF 35-40%, Gr1 DD, mild MR, mod TR, PASP 21mmHg.  [name] Gout   . Hypokalemia   . HTN (hypertension)   . Lipoma   . Dyslipidemia   . CAD (coronary artery disease)     a. Initial nonobst by cath 2009. b. Cath 01/2013 in setting of VT storm: obstructive distal Cx disease (small and terminates in the AV groove, unlikely to cause significant ischemia or electrical instability), nonobstructive RCA disease, EF 15-20%.   [name] PAF (paroxysmal atrial fibrillation)     a. Noted 05/2008 by EKG;  b. CHA2DS2VASc = 5-6-->coumadin.  . Paroxysmal VT     a. s/p St. Jude ICD 2007. b. H/o paroxysmal VT/VF including VT storm 12/2012 admission prompting amio initiation;  c. 01/2013 ICD upgrade SJM 1411-36Q Ellipse VR single lead ICD.  [name] Nonischemic cardiomyopathy     a.  11/2014 Echo: EF 35-40%, Gr1 DD, mild MR, mod TR, PASP 73mmHg.  . ICD (implantable cardiac defibrillator) in place   . OSA (obstructive sleep apnea)     does not wear cpap  . Insulin dependent diabetes mellitus   . Cerebrovascular accident     a. 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 Pulmonary HTN     a. Mild by cath 01/2013.  . CKD (chronic kidney disease), stage II    Past Surgical History  Procedure Laterality Date  . Cardiac catheterization      Nonobstructive coronary disease 2009  . Cardiac defibrillator placement      ICD-St. Jude  . Lipoma excision    . Left and right heart  catheterization with coronary angiogram N/A 01/03/2013    Procedure: LEFT AND RIGHT HEART CATHETERIZATION WITH CORONARY ANGIOGRAM;  Surgeon: Peter M Martinique, MD;  Location: [facility] CATH LAB;  Service: Cardiovascular;  Laterality: N/A;  . Implantable cardioverter defibrillator (icd) generator change N/A 01/15/2014    Procedure: ICD GENERATOR CHANGE;  Surgeon: Evans Lance, MD;  Location: [facility] CATH LAB;  Service: Cardiovascular;  Laterality: N/A;    Allergies  No Known Allergies  HPI   59 year old male with a prior history of a nonischemic cardiomyopathy along with chronic  Combined systolic and diastolic CHF. He is status post single-lead ICD placement. He also has a history of paroxysmal atrial fibrillation and ventricular tachycardia and is maintained on amiodarone therapy. He was recently hospitalized at [facility] long after presenting with  Cough and progressive dyspnea in the setting of febrile illness, leukocytosis, and infiltrate on chest x-ray. He was treated for pneumonia and sepsis. He did have mild troponin elevation and our team saw him throughout his hospitalization. EF was 35-40%. He did have some degree of renal impairment in the setting of chronic kidney disease resulting in ACE inhibitor therapy being discontinued. He was eventually started back on his home dose of Lasix and subsequently discharged. He has since followed up with primary care and has been doing well. His most  recent creatinine On July 26 was 1.74, which is elevated above discharge creatinine. BUN was stable. He has been referred to [place] [facility] for evaluation.   Symptomatically, Mr. Okumura has done exceptionally well. He was discharged home on oxygen therapy and has been working with physical therapy multiple times per week. He notes that he is able to complete physical therapy without chest pain or dyspnea. He also says that his oxygen saturations have generally been above 90% during physical therapy. He would like to get rid of  home O2 if possible. He denies PND, orthopnea, dizziness, syncope, edema, or early satiety.  Home Medications  Prior to Admission medications   Medication Sig Start Date End Date Taking? Authorizing Provider  allopurinol (ZYLOPRIM) 300 MG tablet TAKE 1 TABLET (300 MG TOTAL) BY MOUTH DAILY. 10/31/14  Yes Biagio Borg, MD  amiodarone (PACERONE) 200 MG tablet Take 200 mg by mouth at bedtime.   Yes Historical Provider, MD  amLODipine (NORVASC) 10 MG tablet TAKE 1 TABLET BY MOUTH DAILY 05/18/14  Yes Minus Breeding, MD  atorvastatin (LIPITOR) 80 MG tablet Take 80 mg by mouth at bedtime.   Yes Historical Provider, MD  carvedilol (COREG) 25 MG tablet TAKE 2 TABLETS (50 MG TOTAL) BY MOUTH 2 (TWO) TIMES DAILY WITH A MEAL. 05/24/14  Yes Minus Breeding, MD  furosemide (LASIX) 80 MG tablet TAKE 1 TABLET (80 MG TOTAL) BY MOUTH DAILY. 10/05/14  Yes Minus Breeding, MD  hydrALAZINE (APRESOLINE) 50 MG tablet TAKE 1 TABLET (50 MG TOTAL) BY MOUTH EVERY 8 (EIGHT) HOURS. 06/22/14  Yes Minus Breeding, MD  Insulin Glargine (LANTUS SOLOSTAR) 100 UNIT/ML Solostar Pen Inject 10 Units into the skin at bedtime. 09/19/13  Yes Biagio Borg, MD  isosorbide dinitrate (ISORDIL) 20 MG tablet TAKE 1 TABLET (20 MG TOTAL) BY MOUTH 3 (THREE) TIMES DAILY. 06/22/14  Yes Minus Breeding, MD  KLOR-CON M20 20 MEQ tablet TAKE 2 TABLETS (40 MEQ TOTAL) BY MOUTH DAILY. 04/18/14  Yes Minus Breeding, MD  Multiple Vitamin (MULTIVITAMIN) capsule Take 1 capsule by mouth daily.    Yes Historical Provider, MD  nitroGLYCERIN (NITROSTAT) 0.4 MG SL tablet Place 1 tablet (0.4 mg total) under the tongue every 5 (five) minutes x 3 doses as needed for chest pain. 01/12/14  Yes Minus Breeding, MD  spironolactone (ALDACTONE) 25 MG tablet TAKE 1 TABLET (25 MG TOTAL) BY MOUTH DAILY. 05/02/14  Yes Minus Breeding, MD  warfarin (COUMADIN) 5 MG tablet Take as directed by anticoagulation clinic Patient taking differently: Take 5-10 mg by mouth at bedtime. TAKES 5MG  ON MON,  WED AND FRI  TAKES 10MG  ALL OTHER DAYS 08/13/14  Yes Biagio Borg, MD    Review of Systems   as above, he has been doing well since his discharge from the hospital. He feels that his strength is at about 95%. He denies chest pain, dyspnea, PND, orthopnea, dizziness, syncope, edema, palpitations, or early satiety.  All other systems reviewed and are otherwise negative except as noted above.  Physical Exam  VS:  BP 110/62 mmHg  Pulse 71  Ht 6' (1.829 m)  Wt 260 lb (117.935 kg)  BMI 35.25 kg/m2 , BMI Body mass index is 35.25 kg/(m^2). GEN: Well nourished, well developed, in no acute distress. HEENT: normal. Neck: Supple, no JVD, carotid bruits, or masses. Cardiac: RRR, no murmurs, rubs, or gallops. No clubbing, cyanosis, edema.  Radials/DP/PT 2+ and equal bilaterally.  Respiratory:  Respirations regular and unlabored, clear to auscultation bilaterally. GI:  Obese,Soft, nontender, nondistended, BS + x 4. MS: no deformity or atrophy. Skin: warm and dry, no rash. Neuro:  Strength and sensation are intact. Psych: Normal affect.  Accessory Clinical Findings  ECG -  Regular sinus rhythm , 71, first-degree AV block, PVCs , right axis, IVCD with poor R-wave progression. No acute ST or T changes.  Assessment & Plan  1.   Chronic combined systolic and diastolic just heart failure/nonischemic cardiomypathy: EF 3540% by most recent echo evaluation on July 15th. He has been doing well at home without significant dyspnea, orthopnea, or edema. His weight has been stable and he is euvolemic on exam today. He remains on beta blocker, hydralazine, nitrate, spironolactone, and oral Lasix therapy. He is not on an ACE inhibitor, ARB, or ARNI  In the setting of recent acute on chronic renal disease. I will make no changes to his medication regimen today.   2. Stage II chronic kidney disease: patient had acute worsening of renal function during his hospitalization for sepsis and pneumonia.  Creatinine was  reevaluated on July 26 and was elevated at 1.74. He has been referred to nephrology for evaluation.   3. Essential hypertension: Stable on current regimen which includes amlodipine, beta blocker, hydralazine, nitrate, and direct therapy.   4. Hyperlipidemia: he is managed with amlodipine therapy. LDL was 105 and November 2015.   5. Paroxysmal atrial fibrillation: he is in sinus rhythm and managed with amiodarone. He is anticoagulated with Coumadin.   6. Ventricular tachycardia: quiescent and on amiodarone and beta blocker therapy.   7. Morbid obesity: patient is currently enjoying increased activity with physical therapy. He is not sure how these can continue to exercise once physical therapy ends.   8. Disposition: patient is doing well. Follow-up with Dr. Percival Spanish in 3 months or sooner if necessary.   Murray Hodgkins, NP 12/05/2014, 1:05 PM

## 2014-12-05 NOTE — Patient Instructions (Signed)
Medication Instructions:  Your physician recommends that you continue on your current medications as directed. Please refer to the Current Medication list given to you today.   Labwork: None   Testing/Procedures: None   Follow-Up: Your physician recommends that you schedule a follow-up appointment in: 2 months with Dr.Hochrein    Any Other Special Instructions Will Be Listed Below (If Applicable).

## 2014-12-06 DIAGNOSIS — G4733 Obstructive sleep apnea (adult) (pediatric): Secondary | ICD-10-CM | POA: Diagnosis not present

## 2014-12-06 DIAGNOSIS — Z7901 Long term (current) use of anticoagulants: Secondary | ICD-10-CM | POA: Diagnosis not present

## 2014-12-06 DIAGNOSIS — Z95810 Presence of automatic (implantable) cardiac defibrillator: Secondary | ICD-10-CM | POA: Diagnosis not present

## 2014-12-06 DIAGNOSIS — I480 Paroxysmal atrial fibrillation: Secondary | ICD-10-CM | POA: Diagnosis not present

## 2014-12-06 DIAGNOSIS — I5043 Acute on chronic combined systolic (congestive) and diastolic (congestive) heart failure: Secondary | ICD-10-CM | POA: Diagnosis not present

## 2014-12-06 DIAGNOSIS — E1129 Type 2 diabetes mellitus with other diabetic kidney complication: Secondary | ICD-10-CM | POA: Diagnosis not present

## 2014-12-06 DIAGNOSIS — E785 Hyperlipidemia, unspecified: Secondary | ICD-10-CM | POA: Diagnosis not present

## 2014-12-06 DIAGNOSIS — I129 Hypertensive chronic kidney disease with stage 1 through stage 4 chronic kidney disease, or unspecified chronic kidney disease: Secondary | ICD-10-CM | POA: Diagnosis not present

## 2014-12-06 DIAGNOSIS — N182 Chronic kidney disease, stage 2 (mild): Secondary | ICD-10-CM | POA: Diagnosis not present

## 2014-12-06 DIAGNOSIS — Z794 Long term (current) use of insulin: Secondary | ICD-10-CM | POA: Diagnosis not present

## 2014-12-07 ENCOUNTER — Other Ambulatory Visit: Payer: Medicare Other

## 2014-12-12 ENCOUNTER — Ambulatory Visit: Payer: Medicare Other

## 2014-12-12 ENCOUNTER — Ambulatory Visit (INDEPENDENT_AMBULATORY_CARE_PROVIDER_SITE_OTHER): Payer: Medicare Other | Admitting: General Practice

## 2014-12-12 DIAGNOSIS — Z8679 Personal history of other diseases of the circulatory system: Secondary | ICD-10-CM

## 2014-12-12 DIAGNOSIS — I4891 Unspecified atrial fibrillation: Secondary | ICD-10-CM | POA: Diagnosis not present

## 2014-12-12 DIAGNOSIS — Z5181 Encounter for therapeutic drug level monitoring: Secondary | ICD-10-CM

## 2014-12-12 LAB — POCT INR: INR: 3.2

## 2014-12-12 NOTE — Progress Notes (Signed)
I have reviewed and agree with the plan. 

## 2014-12-12 NOTE — Progress Notes (Signed)
Pre visit review using our clinic review tool, if applicable. No additional management support is needed unless otherwise documented below in the visit note. 

## 2014-12-22 DIAGNOSIS — I509 Heart failure, unspecified: Secondary | ICD-10-CM | POA: Diagnosis not present

## 2015-01-03 ENCOUNTER — Telehealth: Payer: Self-pay | Admitting: Internal Medicine

## 2015-01-03 NOTE — Telephone Encounter (Signed)
Please advise, did PCP order/discontinue O2?

## 2015-01-03 NOTE — Telephone Encounter (Signed)
Pt called in and said that he needs something from dr stating that he no longer needs to be on oxygen.   They will keep billing him if he doesn't have letter from dr that it is ok to stop.     He would like to pick it up 01/04/2015  Best number [phone]

## 2015-01-03 NOTE — Telephone Encounter (Signed)
I was not able to do this at his last visit, and we would need eval in the office to be able to prove that he no  Longer needs the oxygen,   Please consider f/u OV

## 2015-01-09 ENCOUNTER — Ambulatory Visit (INDEPENDENT_AMBULATORY_CARE_PROVIDER_SITE_OTHER): Payer: Medicare Other | Admitting: General Practice

## 2015-01-09 DIAGNOSIS — Z23 Encounter for immunization: Secondary | ICD-10-CM

## 2015-01-09 DIAGNOSIS — Z8679 Personal history of other diseases of the circulatory system: Secondary | ICD-10-CM | POA: Diagnosis not present

## 2015-01-09 DIAGNOSIS — Z5181 Encounter for therapeutic drug level monitoring: Secondary | ICD-10-CM

## 2015-01-09 DIAGNOSIS — I4891 Unspecified atrial fibrillation: Secondary | ICD-10-CM | POA: Diagnosis not present

## 2015-01-09 LAB — POCT INR: INR: 2.4

## 2015-01-09 NOTE — Progress Notes (Signed)
Pre visit review using our clinic review tool, if applicable. No additional management support is needed unless otherwise documented below in the visit note. 

## 2015-01-09 NOTE — Progress Notes (Signed)
I have reviewed and agree with the plan. 

## 2015-01-17 ENCOUNTER — Ambulatory Visit (INDEPENDENT_AMBULATORY_CARE_PROVIDER_SITE_OTHER): Payer: Medicare Other | Admitting: Internal Medicine

## 2015-01-17 ENCOUNTER — Telehealth: Payer: Self-pay | Admitting: Internal Medicine

## 2015-01-17 ENCOUNTER — Other Ambulatory Visit (INDEPENDENT_AMBULATORY_CARE_PROVIDER_SITE_OTHER): Payer: Medicare Other

## 2015-01-17 ENCOUNTER — Encounter: Payer: Self-pay | Admitting: Internal Medicine

## 2015-01-17 VITALS — BP 112/84 | HR 70 | Wt 264.0 lb

## 2015-01-17 DIAGNOSIS — Z0000 Encounter for general adult medical examination without abnormal findings: Secondary | ICD-10-CM

## 2015-01-17 DIAGNOSIS — Z0189 Encounter for other specified special examinations: Secondary | ICD-10-CM | POA: Diagnosis not present

## 2015-01-17 DIAGNOSIS — R0902 Hypoxemia: Secondary | ICD-10-CM

## 2015-01-17 DIAGNOSIS — E119 Type 2 diabetes mellitus without complications: Secondary | ICD-10-CM

## 2015-01-17 DIAGNOSIS — E785 Hyperlipidemia, unspecified: Secondary | ICD-10-CM

## 2015-01-17 DIAGNOSIS — J9601 Acute respiratory failure with hypoxia: Secondary | ICD-10-CM

## 2015-01-17 DIAGNOSIS — E0821 Diabetes mellitus due to underlying condition with diabetic nephropathy: Secondary | ICD-10-CM

## 2015-01-17 LAB — BASIC METABOLIC PANEL
BUN: 27 mg/dL — ABNORMAL HIGH (ref 6–23)
CHLORIDE: 105 meq/L (ref 96–112)
CO2: 30 mEq/L (ref 19–32)
Calcium: 9.2 mg/dL (ref 8.4–10.5)
Creatinine, Ser: 1.54 mg/dL — ABNORMAL HIGH (ref 0.40–1.50)
GFR: 59.81 mL/min — ABNORMAL LOW (ref >60.00)
Glucose, Bld: 106 mg/dL — ABNORMAL HIGH (ref 70–99)
POTASSIUM: 4.1 meq/L (ref 3.5–5.1)
SODIUM: 141 meq/L (ref 135–145)

## 2015-01-17 LAB — CBC WITH DIFFERENTIAL/PLATELET
BASOS ABS: 0.1 10*3/uL (ref 0.0–0.1)
BASOS PCT: 0.9 % (ref 0.0–3.0)
Eosinophils Absolute: 0.1 10*3/uL (ref 0.0–0.7)
Eosinophils Relative: 1.4 % (ref 0.0–5.0)
HEMATOCRIT: 41 % (ref 39.0–52.0)
Hemoglobin: 13.4 g/dL (ref 13.0–17.0)
LYMPHS ABS: 1 10*3/uL (ref 0.7–4.0)
Lymphocytes Relative: 17.9 % (ref 12.0–46.0)
MCHC: 32.5 g/dL (ref 30.0–36.0)
MCV: 95.2 fl (ref 78.0–100.0)
MONOS PCT: 9.7 % (ref 3.0–12.0)
Monocytes Absolute: 0.6 10*3/uL (ref 0.1–1.0)
NEUTROS ABS: 4 10*3/uL (ref 1.4–7.7)
NEUTROS PCT: 70.1 % (ref 43.0–77.0)
PLATELETS: 215 10*3/uL (ref 150.0–400.0)
RBC: 4.31 Mil/uL (ref 4.22–5.81)
RDW: 16.1 % — AB (ref 11.5–15.5)
WBC: 5.7 10*3/uL (ref 4.0–10.5)

## 2015-01-17 LAB — LIPID PANEL
CHOL/HDL RATIO: 4
Cholesterol: 155 mg/dL (ref 0–200)
HDL: 37.6 mg/dL — ABNORMAL LOW (ref >39.00)
LDL Cholesterol: 100 mg/dL — ABNORMAL HIGH (ref 0–99)
NONHDL: 117.02
Triglycerides: 83 mg/dL (ref 0.0–149.0)
VLDL: 16.6 mg/dL (ref 0.0–40.0)

## 2015-01-17 LAB — HEPATIC FUNCTION PANEL
ALK PHOS: 76 U/L (ref 39–117)
ALT: 38 U/L (ref 0–53)
AST: 31 U/L (ref 0–37)
Albumin: 4.1 g/dL (ref 3.5–5.2)
BILIRUBIN DIRECT: 0.2 mg/dL (ref 0.0–0.3)
BILIRUBIN TOTAL: 1.1 mg/dL (ref 0.2–1.2)
TOTAL PROTEIN: 7.5 g/dL (ref 6.0–8.3)

## 2015-01-17 LAB — HEMOGLOBIN A1C: Hgb A1c MFr Bld: 5.5 % (ref 4.6–6.5)

## 2015-01-17 LAB — TSH: TSH: 1.1 u[IU]/mL (ref 0.35–4.50)

## 2015-01-17 LAB — PSA: PSA: 0.48 ng/mL (ref 0.10–4.00)

## 2015-01-17 NOTE — Assessment & Plan Note (Signed)

## 2015-01-17 NOTE — Patient Instructions (Addendum)
Please continue all other medications as before, and refills have been done if requested.  Please have the pharmacy call with any other refills you may need.  Please continue your efforts at being more active, low cholesterol diet, and weight control.  You are otherwise up to date with prevention measures today.  Please keep your appointments with your specialists as you may have planned  You are given the hardcopy order to stop the home oxygen  Please go to the LAB in the Basement (turn left off the elevator) for the tests to be done today  You will be contacted by phone if any changes need to be made immediately.  Otherwise, you will receive a letter about your results with an explanation, but please check with MyChart first.  Please remember to sign up for MyChart if you have not done so, as this will be important to you in the future with finding out test results, communicating by private email, and scheduling acute appointments online when needed.  Please return in 6 months, or sooner if needed, with Lab testing done 3-5 days before  OK to cancel the Nov 30 appt with me

## 2015-01-17 NOTE — Assessment & Plan Note (Signed)
Resolved, ok to d/c home o2 - order given

## 2015-01-17 NOTE — Telephone Encounter (Signed)
Patient called to advise that he spoke to [facility]. They told him that order for d/c O2 has to be faxed to them in order to process. They can not schedule pickup until we send it. He did not have a fax # available.

## 2015-01-17 NOTE — Progress Notes (Signed)
Pre visit review using our clinic review tool, if applicable. No additional management support is needed unless otherwise documented below in the visit note. 

## 2015-01-17 NOTE — Progress Notes (Signed)
Subjective:    Patient ID: [name], male    DOB: 01-29-1956, 59 y.o.   MRN: [id number]  HPI  Here for wellness and f/u;  Overall doing ok;  Pt denies Chest pain, worsening SOB, DOE, wheezing, orthopnea, PND, worsening LE edema, palpitations, dizziness or syncope.  Pt denies neurological change such as new headache, facial or extremity weakness.  Pt denies polydipsia, polyuria, or low sugar symptoms. Pt states overall good compliance with treatment and medications, good tolerability, and has been trying to follow appropriate diet.  Pt denies worsening depressive symptoms, suicidal ideation or panic. No fever, night sweats, wt loss, loss of appetite, or other constitutional symptoms.  Pt states good ability with ADL's, has low fall risk, home safety reviewed and adequate, no other significant changes in hearing or vision, and only occasionally active with exercise.  Has improved o2 sat today, has not used his home o2 in several wks, wants to be able to stop.  Past Medical History  Diagnosis Date  . Chronic systolic CHF (congestive heart failure)     a. Likely NICM (out of proportion to CAD). b. 2009 - EF 25-30% by echo, 01/2013: 15-20% by cath. c. 11/2014 Echo: EF 35-40%, Gr1 DD, mild MR, mod TR, PASP 56mmHg.  [name] Gout   . Hypokalemia   . HTN (hypertension)   . Lipoma   . Dyslipidemia   . CAD (coronary artery disease)     a. Initial nonobst by cath 2009. b. Cath 01/2013 in setting of VT storm: obstructive distal Cx disease (small and terminates in the AV groove, unlikely to cause significant ischemia or electrical instability), nonobstructive RCA disease, EF 15-20%.   [name] PAF (paroxysmal atrial fibrillation)     a. Noted 05/2008 by EKG;  b. CHA2DS2VASc = 5-6-->coumadin.  . Paroxysmal VT     a. s/p St. Jude ICD 2007. b. H/o paroxysmal VT/VF including VT storm 12/2012 admission prompting amio initiation;  c. 01/2013 ICD upgrade SJM 1411-36Q Ellipse VR single lead ICD.  [name] Nonischemic cardiomyopathy       a.  11/2014 Echo: EF 35-40%, Gr1 DD, mild MR, mod TR, PASP 60mmHg.  . ICD (implantable cardiac defibrillator) in place   . OSA (obstructive sleep apnea)     does not wear cpap  . Insulin dependent diabetes mellitus   . Cerebrovascular accident     a. 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 Pulmonary HTN     a. Mild by cath 01/2013.  . CKD (chronic kidney disease), stage II    Past Surgical History  Procedure Laterality Date  . Cardiac catheterization      Nonobstructive coronary disease 2009  . Cardiac defibrillator placement      ICD-St. Jude  . Lipoma excision    . Left and right heart catheterization with coronary angiogram N/A 01/03/2013    Procedure: LEFT AND RIGHT HEART CATHETERIZATION WITH CORONARY ANGIOGRAM;  Surgeon: Peter M Martinique, MD;  Location: [facility] CATH LAB;  Service: Cardiovascular;  Laterality: N/A;  . Implantable cardioverter defibrillator (icd) generator change N/A 01/15/2014    Procedure: ICD GENERATOR CHANGE;  Surgeon: Evans Lance, MD;  Location: [facility] C[facility] LAB;  Service: Cardiovascular;  Laterality: N/A;    reports that he quit smoking about 28 years ago. He has never used smokeless tobacco. He reports that he does not drink alcohol or use illicit drugs. family history includes Coronary artery disease in his mother; Heart attack in his maternal grandmother, maternal uncle, and mother.  No Known Allergies Current Outpatient Prescriptions on File Prior to Visit  Medication Sig Dispense Refill  . allopurinol (ZYLOPRIM) 300 MG tablet TAKE 1 TABLET (300 MG TOTAL) BY MOUTH DAILY. 90 tablet 1  . amiodarone (PACERONE) 200 MG tablet Take 200 mg by mouth at bedtime.    [name] amLODipine (NORVASC) 10 MG tablet TAKE 1 TABLET BY MOUTH DAILY 90 tablet 3  . atorvastatin (LIPITOR) 80 MG tablet Take 80 mg by mouth at bedtime.    . carvedilol (COREG) 25 MG tablet TAKE 2 TABLETS (50 MG TOTAL) BY MOUTH 2 (TWO) TIMES DAILY WITH A MEAL. 360 tablet 3  . furosemide (LASIX) 80 MG tablet TAKE 1 TABLET  (80 MG TOTAL) BY MOUTH DAILY. 90 tablet 4  . hydrALAZINE (APRESOLINE) 50 MG tablet TAKE 1 TABLET (50 MG TOTAL) BY MOUTH EVERY 8 (EIGHT) HOURS. 270 tablet 1  . Insulin Glargine (LANTUS SOLOSTAR) 100 UNIT/ML Solostar Pen Inject 10 Units into the skin at bedtime. 30 mL 11  . isosorbide dinitrate (ISORDIL) 20 MG tablet TAKE 1 TABLET (20 MG TOTAL) BY MOUTH 3 (THREE) TIMES DAILY. 270 tablet 1  . KLOR-CON M20 20 MEQ tablet TAKE 2 TABLETS (40 MEQ TOTAL) BY MOUTH DAILY. 180 tablet 3  . Multiple Vitamin (MULTIVITAMIN) capsule Take 1 capsule by mouth daily.     . nitroGLYCERIN (NITROSTAT) 0.4 MG SL tablet Place 1 tablet (0.4 mg total) under the tongue every 5 (five) minutes x 3 doses as needed for chest pain. 25 tablet 2  . spironolactone (ALDACTONE) 25 MG tablet TAKE 1 TABLET (25 MG TOTAL) BY MOUTH DAILY. 90 tablet 3  . warfarin (COUMADIN) 5 MG tablet Take as directed by anticoagulation clinic (Patient taking differently: Take 5-10 mg by mouth at bedtime. TAKES 5MG  ON MON, WED AND FRI  TAKES 10MG  ALL OTHER DAYS) 180 tablet 1   No current facility-administered medications on file prior to visit.   Review of Systems Constitutional: Negative for increased diaphoresis, other activity, appetite or siginficant weight change other than noted HENT: Negative for worsening hearing loss, ear pain, facial swelling, mouth sores and neck stiffness.   Eyes: Negative for other worsening pain, redness or visual disturbance.  Respiratory: Negative for shortness of breath and wheezing  Cardiovascular: Negative for chest pain and palpitations.  Gastrointestinal: Negative for diarrhea, blood in stool, abdominal distention or other pain Genitourinary: Negative for hematuria, flank pain or change in urine volume.  Musculoskeletal: Negative for myalgias or other joint complaints.  Skin: Negative for color change and wound or drainage.  Neurological: Negative for syncope and numbness. other than noted Hematological: Negative  for adenopathy. or other swelling Psychiatric/Behavioral: Negative for hallucinations, SI, self-injury, decreased concentration or other worsening agitation.      Objective:   Physical Exam BP 112/84 mmHg  Pulse 70  Wt 264 lb (119.75 kg)  SpO2 96% Ambulatory o2 sat:  95% RA after 200 ft, HR high 90's VS noted,  Constitutional: Pt is oriented to person, place, and time. Appears well-developed and well-nourished, in no significant distress Head: Normocephalic and atraumatic.  Right Ear: External ear normal.  Left Ear: External ear normal.  Nose: Nose normal.  Mouth/Throat: Oropharynx is clear and moist.  Eyes: Conjunctivae and EOM are normal. Pupils are equal, round, and reactive to light.  Neck: Normal range of motion. Neck supple. No JVD present. No tracheal deviation present or significant neck LA or mass Cardiovascular: Normal rate, regular rhythm, normal heart sounds and intact distal pulses.  Pulmonary/Chest: Effort normal and breath sounds decresaed without rales or wheezing  Abdominal: Soft. Bowel sounds are normal. NT. No HSM  Musculoskeletal: Normal range of motion. Exhibits no edema.  Lymphadenopathy:  Has no cervical adenopathy.  Neurological: Pt is alert and oriented to person, place, and time. Pt has normal reflexes. No cranial nerve deficit. Motor grossly intact Skin: Skin is warm and dry. No rash noted.  Psychiatric:  Has normal mood and affect. Behavior is normal.  t   Assessment & Plan:

## 2015-01-17 NOTE — Assessment & Plan Note (Signed)
stable overall by history and exam, recent data reviewed with pt, and pt to continue medical treatment as before,  to f/u any worsening symptoms or concerns ble Lab Results  Component Value Date   HGBA1C 5.2 03/23/2014    for f/u lab today

## 2015-01-18 NOTE — Telephone Encounter (Signed)
Done hardcopy to stef 

## 2015-01-21 NOTE — Telephone Encounter (Signed)
Patient has called and he stated that [facility] has not received this order. He still does not have their fax # Can you please call him at [phone]

## 2015-01-22 DIAGNOSIS — I509 Heart failure, unspecified: Secondary | ICD-10-CM | POA: Diagnosis not present

## 2015-01-22 NOTE — Telephone Encounter (Signed)
FYI:  [facility] will be able to see that order for O2 and the note on the order for discontinuation of same.

## 2015-02-04 ENCOUNTER — Ambulatory Visit (INDEPENDENT_AMBULATORY_CARE_PROVIDER_SITE_OTHER): Payer: Medicare Other | Admitting: *Deleted

## 2015-02-04 DIAGNOSIS — I429 Cardiomyopathy, unspecified: Secondary | ICD-10-CM | POA: Diagnosis not present

## 2015-02-04 DIAGNOSIS — I428 Other cardiomyopathies: Secondary | ICD-10-CM

## 2015-02-04 NOTE — Progress Notes (Signed)
Remote ICD transmission.   

## 2015-02-05 ENCOUNTER — Telehealth: Payer: Self-pay | Admitting: Cardiology

## 2015-02-05 LAB — CUP PACEART REMOTE DEVICE CHECK
Battery Remaining Longevity: 91 mo
Brady Statistic RV Percent Paced: 1 %
HighPow Impedance: 44 Ohm
HighPow Impedance: 44 Ohm
Lead Channel Pacing Threshold Amplitude: 1.25 V
Lead Channel Pacing Threshold Pulse Width: 0.7 ms
Lead Channel Setting Pacing Amplitude: 2.5 V
MDC IDC MSMT BATTERY REMAINING PERCENTAGE: 89 %
MDC IDC MSMT BATTERY VOLTAGE: 3.13 V
MDC IDC MSMT LEADCHNL RV IMPEDANCE VALUE: 400 Ohm
MDC IDC MSMT LEADCHNL RV SENSING INTR AMPL: 12 mV
MDC IDC SESS DTM: 20161003060016
MDC IDC SET LEADCHNL RV PACING PULSEWIDTH: 0.7 ms
MDC IDC SET LEADCHNL RV SENSING SENSITIVITY: 0.5 mV
MDC IDC SET ZONE DETECTION INTERVAL: 320 ms
Pulse Gen Serial Number: 7214340
Zone Setting Detection Interval: 250 ms
Zone Setting Detection Interval: 280 ms

## 2015-02-05 NOTE — Telephone Encounter (Signed)
Called patient to inform him that we did receive his remote transmission on 02/04/15 and that it shows no alerts.  Patient denies any additional questions or concerns at this time and is appreciative of call.

## 2015-02-05 NOTE — Telephone Encounter (Signed)
New Message    4. Are you calling to see if we received your device transmission? Yes  Please call

## 2015-02-06 ENCOUNTER — Ambulatory Visit (INDEPENDENT_AMBULATORY_CARE_PROVIDER_SITE_OTHER): Payer: Medicare Other | Admitting: *Deleted

## 2015-02-06 DIAGNOSIS — Z5181 Encounter for therapeutic drug level monitoring: Secondary | ICD-10-CM

## 2015-02-06 DIAGNOSIS — I4891 Unspecified atrial fibrillation: Secondary | ICD-10-CM

## 2015-02-06 DIAGNOSIS — Z8679 Personal history of other diseases of the circulatory system: Secondary | ICD-10-CM

## 2015-02-06 LAB — POCT INR: INR: 2.4

## 2015-02-06 NOTE — Progress Notes (Signed)
I have reviewed and agree with the plan. 

## 2015-02-06 NOTE — Progress Notes (Signed)
Pre visit review using our clinic review tool, if applicable. No additional management support is needed unless otherwise documented below in the visit note. 

## 2015-02-07 ENCOUNTER — Ambulatory Visit (INDEPENDENT_AMBULATORY_CARE_PROVIDER_SITE_OTHER): Payer: Medicare Other | Admitting: Cardiology

## 2015-02-07 ENCOUNTER — Encounter: Payer: Self-pay | Admitting: Cardiology

## 2015-02-07 VITALS — BP 120/82 | HR 68 | Wt 268.0 lb

## 2015-02-07 DIAGNOSIS — I420 Dilated cardiomyopathy: Secondary | ICD-10-CM

## 2015-02-07 NOTE — Patient Instructions (Signed)
Your physician wants you to follow-up in: 3 Months. You will receive a reminder letter in the mail two months in advance. If you don't receive a letter, please call our office to schedule the follow-up appointment.

## 2015-02-07 NOTE — Progress Notes (Signed)
HPI The patient presents for follow up of CHF.  Since I last saw him he was hospitalized. He was thought to have pneumonia and sepsis. There was a mild troponin elevation. He had some renal insufficiency. His EF was stable at 35-40% during that visit.  He saw [place] back in follow-up and was doing well and there were no med adjustments.   Since I last saw him he has done well.  The patient denies any new symptoms such as chest discomfort, neck or arm discomfort. There has been no new shortness of breath, PND or orthopnea. There have been no reported palpitations, presyncope or syncope.  He is back to exercising at the [facility]   No Known Allergies  Current Outpatient Prescriptions  Medication Sig Dispense Refill  . allopurinol (ZYLOPRIM) 300 MG tablet TAKE 1 TABLET (300 MG TOTAL) BY MOUTH DAILY. 90 tablet 1  . amiodarone (PACERONE) 200 MG tablet Take 200 mg by mouth at bedtime.    [name] amLODipine (NORVASC) 10 MG tablet TAKE 1 TABLET BY MOUTH DAILY 90 tablet 3  . atorvastatin (LIPITOR) 80 MG tablet Take 80 mg by mouth at bedtime.    . carvedilol (COREG) 25 MG tablet TAKE 2 TABLETS (50 MG TOTAL) BY MOUTH 2 (TWO) TIMES DAILY WITH A MEAL. 360 tablet 3  . furosemide (LASIX) 80 MG tablet TAKE 1 TABLET (80 MG TOTAL) BY MOUTH DAILY. 90 tablet 4  . hydrALAZINE (APRESOLINE) 50 MG tablet TAKE 1 TABLET (50 MG TOTAL) BY MOUTH EVERY 8 (EIGHT) HOURS. 270 tablet 1  . Insulin Glargine (LANTUS SOLOSTAR) 100 UNIT/ML Solostar Pen Inject 10 Units into the skin at bedtime. 30 mL 11  . isosorbide dinitrate (ISORDIL) 20 MG tablet TAKE 1 TABLET (20 MG TOTAL) BY MOUTH 3 (THREE) TIMES DAILY. 270 tablet 1  . KLOR-CON M20 20 MEQ tablet TAKE 2 TABLETS (40 MEQ TOTAL) BY MOUTH DAILY. 180 tablet 3  . Multiple Vitamin (MULTIVITAMIN) capsule Take 1 capsule by mouth daily.     . nitroGLYCERIN (NITROSTAT) 0.4 MG SL tablet Place 1 tablet (0.4 mg total) under the tongue every 5 (five) minutes x 3 doses as needed for chest pain. 25  tablet 2  . spironolactone (ALDACTONE) 25 MG tablet TAKE 1 TABLET (25 MG TOTAL) BY MOUTH DAILY. 90 tablet 3  . warfarin (COUMADIN) 5 MG tablet Take as directed by anticoagulation clinic (Patient taking differently: Take 5-10 mg by mouth at bedtime. TAKES 5MG  ON MON, WED AND FRI  TAKES 10MG  ALL OTHER DAYS) 180 tablet 1   No current facility-administered medications for this visit.    Past Medical History  Diagnosis Date  . Chronic systolic CHF (congestive heart failure) ([place])     a. Likely NICM (out of proportion to CAD). b. 2009 - EF 25-30% by echo, 01/2013: 15-20% by cath. c. 11/2014 Echo: EF 35-40%, Gr1 DD, mild MR, mod TR, PASP 47mmHg.  [name] Gout   . Hypokalemia   . HTN (hypertension)   . Lipoma   . Dyslipidemia   . CAD (coronary artery disease)     a. Initial nonobst by cath 2009. b. Cath 01/2013 in setting of VT storm: obstructive distal Cx disease (small and terminates in the AV groove, unlikely to cause significant ischemia or electrical instability), nonobstructive RCA disease, EF 15-20%.   [name] PAF (paroxysmal atrial fibrillation) ([place])     a. Noted 05/2008 by EKG;  b. CHA2DS2VASc = 5-6-->coumadin.  . Paroxysmal VT ([place])     a.  s/p St. Jude ICD 2007. b. H/o paroxysmal VT/VF including VT storm 12/2012 admission prompting amio initiation;  c. 01/2013 ICD upgrade SJM 1411-36Q Ellipse VR single lead ICD.  [name] Nonischemic cardiomyopathy ([place])     a.  11/2014 Echo: EF 35-40%, Gr1 DD, mild MR, mod TR, PASP 35mmHg.  . ICD (implantable cardiac defibrillator) in place   . OSA (obstructive sleep apnea)     does not wear cpap  . Insulin dependent diabetes mellitus ([place])   . Cerebrovascular accident [facility])     a. 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 Pulmonary HTN ([place])     a. Mild by cath 01/2013.  . CKD (chronic kidney disease), stage II     Past Surgical History  Procedure Laterality Date  . Cardiac catheterization      Nonobstructive coronary disease 2009  . Cardiac defibrillator placement       ICD-St. Jude  . Lipoma excision    . Left and right heart catheterization with coronary angiogram N/A 01/03/2013    Procedure: LEFT AND RIGHT HEART CATHETERIZATION WITH CORONARY ANGIOGRAM;  Surgeon: Peter M Martinique, MD;  Location: [facility] CATH LAB;  Service: Cardiovascular;  Laterality: N/A;  . Implantable cardioverter defibrillator (icd) generator change N/A 01/15/2014    Procedure: ICD GENERATOR CHANGE;  Surgeon: Evans Lance, MD;  Location: [facility] CATH LAB;  Service: Cardiovascular;  Laterality: N/A;   ROS:  As stated in the HPI and negative for all other systems.  PHYSICAL EXAM BP 120/82 mmHg  Pulse 68  Wt 268 lb (121.564 kg) GENERAL:  Well appearing NECK:  No jugular venous distention, waveform within normal limits, carotid upstroke brisk and symmetric, no bruits, no thyromegaly LUNGS:  Clear to auscultation bilaterally BACK:  No CVA tenderness CHEST:  Unremarkable, well-healed ICD pocket HEART:  PMI not displaced or sustained,S1 and S2 within normal limits, no S3, no S4, no clicks, no rubs, no murmurs ABD:  Flat, positive bowel sounds normal in frequency in pitch, no bruits, no rebound, no guarding, no midline pulsatile mass, no hepatomegaly, no splenomegaly EXT:  2 plus pulses throughout, no edema, no cyanosis no clubbing   ASSESSMENT AND PLAN  Congestive heart failure, systolic-  He has had no new symptoms since he was last in the hospital. He seems to be euvolemic. He'll continue the meds as listed.   HYPERTENSION -  His blood pressure is well controlled.  He will continue the medications as ordered.  Atrial fibrillation-  He has had no symptomatic paroxysms of this. He's not interested in changing from warfarin to another agent.  He is up-to-date with his labs for his amiodarone. No change in therapy is indicated.  CKD II - This is stable.  No change in therapy is indicated.    HYPERLIPIDEMIA - His LDL was recently 100.  He will continue with meds as listed.   ICD - He will  follow in the lipid clinic.  OBESITY - The patient understands the need to lose weight with diet and exercise. We have discussed specific strategies for this.

## 2015-02-13 ENCOUNTER — Encounter: Payer: Self-pay | Admitting: Cardiology

## 2015-02-23 ENCOUNTER — Other Ambulatory Visit: Payer: Self-pay | Admitting: Cardiology

## 2015-02-26 ENCOUNTER — Encounter: Payer: Self-pay | Admitting: Internal Medicine

## 2015-02-27 ENCOUNTER — Encounter: Payer: Self-pay | Admitting: Cardiology

## 2015-03-06 ENCOUNTER — Ambulatory Visit (INDEPENDENT_AMBULATORY_CARE_PROVIDER_SITE_OTHER): Payer: Medicare Other | Admitting: General Practice

## 2015-03-06 DIAGNOSIS — Z8679 Personal history of other diseases of the circulatory system: Secondary | ICD-10-CM

## 2015-03-06 DIAGNOSIS — Z5181 Encounter for therapeutic drug level monitoring: Secondary | ICD-10-CM

## 2015-03-06 LAB — POCT INR: INR: 2.3

## 2015-03-06 NOTE — Progress Notes (Signed)
I have reviewed and agree with the plan. 

## 2015-03-06 NOTE — Progress Notes (Signed)
Pre visit review using our clinic review tool, if applicable. No additional management support is needed unless otherwise documented below in the visit note. 

## 2015-04-03 ENCOUNTER — Ambulatory Visit: Payer: Medicare Other | Admitting: Internal Medicine

## 2015-04-17 ENCOUNTER — Ambulatory Visit (INDEPENDENT_AMBULATORY_CARE_PROVIDER_SITE_OTHER): Payer: Medicare Other | Admitting: General Practice

## 2015-04-17 ENCOUNTER — Other Ambulatory Visit: Payer: Self-pay | Admitting: Internal Medicine

## 2015-04-17 ENCOUNTER — Other Ambulatory Visit: Payer: Self-pay | Admitting: Cardiology

## 2015-04-17 DIAGNOSIS — Z5181 Encounter for therapeutic drug level monitoring: Secondary | ICD-10-CM | POA: Diagnosis not present

## 2015-04-17 LAB — POCT INR: INR: 2.3

## 2015-04-17 NOTE — Progress Notes (Signed)
I have reviewed and agree with the plan. 

## 2015-04-17 NOTE — Progress Notes (Signed)
Pre visit review using our clinic review tool, if applicable. No additional management support is needed unless otherwise documented below in the visit note. 

## 2015-04-19 ENCOUNTER — Other Ambulatory Visit: Payer: Self-pay | Admitting: Cardiology

## 2015-04-19 NOTE — Telephone Encounter (Signed)
Rx(s) sent to pharmacy electronically.  

## 2015-05-13 ENCOUNTER — Ambulatory Visit: Payer: Medicare Other | Admitting: Cardiology

## 2015-05-14 ENCOUNTER — Encounter: Payer: Medicare Other | Admitting: Internal Medicine

## 2015-05-20 ENCOUNTER — Other Ambulatory Visit: Payer: Self-pay | Admitting: Internal Medicine

## 2015-05-20 ENCOUNTER — Other Ambulatory Visit: Payer: Self-pay | Admitting: Cardiology

## 2015-05-20 NOTE — Telephone Encounter (Signed)
Rx request sent to pharmacy.  

## 2015-05-26 NOTE — Progress Notes (Signed)
HPI The patient presents for follow up of CHF.  He was hospitalized with pneumonia and sepsis last July. There was a mild troponin elevation. He had some renal insufficiency. His EF was stable at 35-40% during that visit.  I have seen him back in follow up twice since then.  When I last saw him last in Oct 2016 he was doing well and was exercising at the [facility]. Since I saw him he has done well. He did have one day where he gained a little fluid that he took an extra half of Lasix. He weighs himself every day with scales from [facility].  His weights are otherwise fairly stable.  He is doing little walking in his neighborhood as well. He is disappointed because he is not losing weight. He has no new shortness of breath, PND or orthopnea. He's not having any new palpitations, presyncope or syncope.  No Known Allergies  Current Outpatient Prescriptions  Medication Sig Dispense Refill  . allopurinol (ZYLOPRIM) 300 MG tablet TAKE 1 TABLET (300 MG TOTAL) BY MOUTH DAILY. 90 tablet 2  . amiodarone (PACERONE) 200 MG tablet Take 200 mg by mouth at bedtime.    [name] amiodarone (PACERONE) 200 MG tablet TAKE 1 TABLET (200 MG TOTAL) BY MOUTH DAILY. 90 tablet 1  . amLODipine (NORVASC) 10 MG tablet TAKE 1 TABLET BY MOUTH DAILY 90 tablet 0  . atorvastatin (LIPITOR) 80 MG tablet Take 80 mg by mouth at bedtime.    . carvedilol (COREG) 25 MG tablet TAKE 2 TABLETS (50 MG TOTAL) BY MOUTH 2 (TWO) TIMES DAILY WITH A MEAL. 360 tablet 3  . furosemide (LASIX) 80 MG tablet TAKE 1 TABLET (80 MG TOTAL) BY MOUTH DAILY. 90 tablet 4  . hydrALAZINE (APRESOLINE) 50 MG tablet TAKE 1 TABLET (50 MG TOTAL) BY MOUTH EVERY 8 (EIGHT) HOURS. 270 tablet 0  . Insulin Glargine (LANTUS SOLOSTAR) 100 UNIT/ML Solostar Pen Inject 10 Units into the skin at bedtime. 30 mL 11  . isosorbide dinitrate (ISORDIL) 20 MG tablet TAKE 1 TABLET (20 MG TOTAL) BY MOUTH 3 (THREE) TIMES DAILY. 270 tablet 0  . isosorbide dinitrate (ISORDIL) 20 MG  tablet TAKE 1 TABLET (20 MG TOTAL) BY MOUTH 3 (THREE) TIMES DAILY. 270 tablet 0  . KLOR-CON M20 20 MEQ tablet TAKE 2 TABLETS (40 MEQ TOTAL) BY MOUTH DAILY. 180 tablet 2  . Multiple Vitamin (MULTIVITAMIN) capsule Take 1 capsule by mouth daily.     . nitroGLYCERIN (NITROSTAT) 0.4 MG SL tablet Place 1 tablet (0.4 mg total) under the tongue every 5 (five) minutes x 3 doses as needed for chest pain. 25 tablet 2  . spironolactone (ALDACTONE) 25 MG tablet TAKE 1 TABLET (25 MG TOTAL) BY MOUTH DAILY. 90 tablet 0  . warfarin (COUMADIN) 5 MG tablet TAKE AS DIRECTED BY ANTICOAGULATION CLINIC 180 tablet 1   No current facility-administered medications for this visit.    Past Medical History  Diagnosis Date  . Chronic systolic CHF (congestive heart failure) ([place])     a. Likely NICM (out of proportion to CAD). b. 2009 - EF 25-30% by echo, 01/2013: 15-20% by cath. c. 11/2014 Echo: EF 35-40%, Gr1 DD, mild MR, mod TR, PASP 87mmHg.  [name] Gout   . Hypokalemia   . HTN (hypertension)   . Lipoma   . Dyslipidemia   . CAD (coronary artery disease)     a. Initial nonobst by cath 2009. b. Cath 01/2013 in setting of VT storm: obstructive distal  Cx disease (small and terminates in the AV groove, unlikely to cause significant ischemia or electrical instability), nonobstructive RCA disease, EF 15-20%.   [name] PAF (paroxysmal atrial fibrillation) ([place])     a. Noted 05/2008 by EKG;  b. CHA2DS2VASc = 5-6-->coumadin.  . Paroxysmal VT ([place])     a. s/p St. Jude ICD 2007. b. H/o paroxysmal VT/VF including VT storm 12/2012 admission prompting amio initiation;  c. 01/2013 ICD upgrade SJM 1411-36Q Ellipse VR single lead ICD.  [name] Nonischemic cardiomyopathy ([place])     a.  11/2014 Echo: EF 35-40%, Gr1 DD, mild MR, mod TR, PASP 1mmHg.  . ICD (implantable cardiac defibrillator) in place   . OSA (obstructive sleep apnea)     does not wear cpap  . Insulin dependent diabetes mellitus ([place])   . Cerebrovascular accident [facility])     a. Basilar CVA  2000. denies deficits  . Pulmonary HTN ([place])     a. Mild by cath 01/2013.  . CKD (chronic kidney disease), stage II     Past Surgical History  Procedure Laterality Date  . Cardiac catheterization      Nonobstructive coronary disease 2009  . Cardiac defibrillator placement      ICD-St. Jude  . Lipoma excision    . Left and right heart catheterization with coronary angiogram N/A 01/03/2013    Procedure: LEFT AND RIGHT HEART CATHETERIZATION WITH CORONARY ANGIOGRAM;  Surgeon: Peter M Martinique, MD;  Location: [facility] CATH LAB;  Service: Cardiovascular;  Laterality: N/A;  . Implantable cardioverter defibrillator (icd) generator change N/A 01/15/2014    Procedure: ICD GENERATOR CHANGE;  Surgeon: Evans Lance, MD;  Location: [facility] CATH LAB;  Service: Cardiovascular;  Laterality: N/A;   ROS:  As stated in the HPI and negative for all other systems.  PHYSICAL EXAM BP 112/80 mmHg  Pulse 77  Ht 6' (1.829 m)  Wt 267 lb (121.11 kg)  BMI 36.20 kg/m2 GENERAL:  Well appearing NECK:  No jugular venous distention, waveform within normal limits, carotid upstroke brisk and symmetric, no bruits, no thyromegaly LUNGS:  Clear to auscultation bilaterally BACK:  No CVA tenderness CHEST:  Unremarkable, well-healed ICD pocket HEART:  PMI not displaced or sustained,S1 and S2 within normal limits, no S3, no S4, no clicks, no rubs, no murmurs ABD:  Flat, positive bowel sounds normal in frequency in pitch, no bruits, no rebound, no guarding, no midline pulsatile mass, no hepatomegaly, no splenomegaly EXT:  2 plus pulses throughout, no edema, no cyanosis no clubbing  EKG:  Sinus rhythm, rate 77, left axis deviation, left anterior fascicular block, right bundle branch block, QTC prolonged, interventricular conduction delay.  05/27/2015  ASSESSMENT AND PLAN  Congestive heart failure, systolic-  He seems to be euvolemic. No change in therapy is indicated.  HYPERTENSION -  His blood pressures being treated in the context  of treating his congestive heart failure.  Atrial fibrillation-  He has had no symptomatic paroxysms of this. He's not interested in changing from warfarin to another agent.  He is up-to-date with his labs for his amiodarone. No change in therapy is indicated.  I would like for him to have TSH and liver enzymes in March. He will be seeing his primary care doctor at that time. Lab Results  Component Value Date   TSH 1.10 01/17/2015   ALT 38 01/17/2015   AST 31 01/17/2015   ALKPHOS 76 01/17/2015   BILITOT 1.1 01/17/2015   PROT 7.5 01/17/2015   ALBUMIN 4.1 01/17/2015  CKD II - This is stable.  No change in therapy is indicated.   Lab Results  Component Value Date   CREATININE 1.54* 01/17/2015      HYPERLIPIDEMIA - His LDL was recently 100.  He will continue with meds as listed.   Lab Results  Component Value Date   CHOL 155 01/17/2015   TRIG 83.0 01/17/2015   HDL 37.60* 01/17/2015   LDLCALC 100* 01/17/2015   LDLDIRECT 194.6 05/09/2007    ICD - He will follow in the EP clinic later this week  OBESITY - The patient understands the need to lose weight with diet and exercise. We have discussed specific strategies for this.   I prescribed exercise 5 times per week.

## 2015-05-27 ENCOUNTER — Ambulatory Visit (INDEPENDENT_AMBULATORY_CARE_PROVIDER_SITE_OTHER): Payer: Medicare Other | Admitting: Cardiology

## 2015-05-27 ENCOUNTER — Encounter: Payer: Self-pay | Admitting: Cardiology

## 2015-05-27 VITALS — BP 112/80 | HR 77 | Ht 72.0 in | Wt 267.0 lb

## 2015-05-27 DIAGNOSIS — I5022 Chronic systolic (congestive) heart failure: Secondary | ICD-10-CM | POA: Diagnosis not present

## 2015-05-27 NOTE — Patient Instructions (Addendum)
Your physician recommends that you return for lab work in [facility] when you see your primary care doctor.  Dr Percival Spanish recommends that you schedule a follow-up appointment in 6 months. You will receive a reminder letter in the mail two months in advance. If you don't receive a letter, please call our office to schedule the follow-up appointment.  If you need a refill on your cardiac medications before your next appointment, please call your pharmacy.

## 2015-05-28 ENCOUNTER — Other Ambulatory Visit: Payer: Self-pay | Admitting: Internal Medicine

## 2015-05-28 ENCOUNTER — Ambulatory Visit (INDEPENDENT_AMBULATORY_CARE_PROVIDER_SITE_OTHER): Payer: Medicare Other | Admitting: General Practice

## 2015-05-28 DIAGNOSIS — Z8679 Personal history of other diseases of the circulatory system: Secondary | ICD-10-CM | POA: Diagnosis not present

## 2015-05-28 DIAGNOSIS — Z5181 Encounter for therapeutic drug level monitoring: Secondary | ICD-10-CM | POA: Diagnosis not present

## 2015-05-28 DIAGNOSIS — I4891 Unspecified atrial fibrillation: Secondary | ICD-10-CM | POA: Diagnosis not present

## 2015-05-28 LAB — POCT INR: INR: 2.2

## 2015-05-28 NOTE — Progress Notes (Signed)
Pre visit review using our clinic review tool, if applicable. No additional management support is needed unless otherwise documented below in the visit note. 

## 2015-05-29 ENCOUNTER — Ambulatory Visit: Payer: Medicare Other

## 2015-05-30 ENCOUNTER — Encounter: Payer: Self-pay | Admitting: Internal Medicine

## 2015-05-30 ENCOUNTER — Ambulatory Visit (INDEPENDENT_AMBULATORY_CARE_PROVIDER_SITE_OTHER): Payer: Medicare Other | Admitting: Internal Medicine

## 2015-05-30 VITALS — BP 120/82 | HR 70 | Ht 72.0 in | Wt 267.8 lb

## 2015-05-30 DIAGNOSIS — Z95810 Presence of automatic (implantable) cardiac defibrillator: Secondary | ICD-10-CM

## 2015-05-30 DIAGNOSIS — I472 Ventricular tachycardia, unspecified: Secondary | ICD-10-CM

## 2015-05-30 DIAGNOSIS — I110 Hypertensive heart disease with heart failure: Secondary | ICD-10-CM

## 2015-05-30 DIAGNOSIS — I5022 Chronic systolic (congestive) heart failure: Secondary | ICD-10-CM

## 2015-05-30 LAB — CUP PACEART INCLINIC DEVICE CHECK
Battery Remaining Longevity: 92.4
Brady Statistic RV Percent Paced: 0 %
HighPow Impedance: 46.3074
Lead Channel Impedance Value: 425 Ohm
Lead Channel Pacing Threshold Amplitude: 1.25 V
Lead Channel Sensing Intrinsic Amplitude: 12 mV
Lead Channel Setting Pacing Amplitude: 2.5 V
Lead Channel Setting Pacing Pulse Width: 0.7 ms
Lead Channel Setting Sensing Sensitivity: 0.5 mV
MDC IDC LEAD IMPLANT DT: 20070817
MDC IDC LEAD LOCATION: 753860
MDC IDC LEAD MODEL: 7001
MDC IDC MSMT LEADCHNL RV PACING THRESHOLD AMPLITUDE: 1.25 V
MDC IDC MSMT LEADCHNL RV PACING THRESHOLD PULSEWIDTH: 0.7 ms
MDC IDC MSMT LEADCHNL RV PACING THRESHOLD PULSEWIDTH: 0.7 ms
MDC IDC SESS DTM: 20170126093522
Pulse Gen Serial Number: 7214340

## 2015-05-30 NOTE — Assessment & Plan Note (Signed)
His St. Jude device is working normally. Will recheck in several months.

## 2015-05-30 NOTE — Assessment & Plan Note (Signed)
He has class 2 symptoms. He will continue his current meds. I have asked him to lose weight.

## 2015-05-30 NOTE — Assessment & Plan Note (Signed)
His blood pressure is well controlled today. He is instructed to reduce he salt intake.

## 2015-05-30 NOTE — Assessment & Plan Note (Signed)
He has had no recurrent VT on amio. No change in meds.

## 2015-05-30 NOTE — Progress Notes (Signed)
HPI Mr. Langer returns today for followup. He is a pleasant 60 yo man with a h/o non-ischemic CM, chronic systolic heart failure and VT.  The patient denies chest pain or worsening shortness of breath or peripheral edema.  He does admit to some noncompliance with his diet. He has undergone insertion of a new ICD, as his prior device had reached elective replacement. He has had no recurrent hospitalizations. He has very mild peripheral edema. He is trying to maintain a low-sodium diet. He has struggled with his weight.   No Known Allergies   Current Outpatient Prescriptions  Medication Sig Dispense Refill  . allopurinol (ZYLOPRIM) 300 MG tablet TAKE 1 TABLET (300 MG TOTAL) BY MOUTH DAILY. 90 tablet 2  . amiodarone (PACERONE) 200 MG tablet TAKE 1 TABLET (200 MG TOTAL) BY MOUTH DAILY. 90 tablet 1  . amLODipine (NORVASC) 10 MG tablet TAKE 1 TABLET BY MOUTH DAILY 90 tablet 0  . atorvastatin (LIPITOR) 80 MG tablet Take 80 mg by mouth at bedtime.    . carvedilol (COREG) 25 MG tablet TAKE 2 TABLETS (50 MG TOTAL) BY MOUTH 2 (TWO) TIMES DAILY WITH A MEAL. 360 tablet 3  . furosemide (LASIX) 80 MG tablet TAKE 1 TABLET (80 MG TOTAL) BY MOUTH DAILY. 90 tablet 4  . hydrALAZINE (APRESOLINE) 50 MG tablet TAKE 1 TABLET (50 MG TOTAL) BY MOUTH EVERY 8 (EIGHT) HOURS. 270 tablet 0  . isosorbide dinitrate (ISORDIL) 20 MG tablet TAKE 1 TABLET (20 MG TOTAL) BY MOUTH 3 (THREE) TIMES DAILY. 270 tablet 0  . KLOR-CON M20 20 MEQ tablet TAKE 2 TABLETS (40 MEQ TOTAL) BY MOUTH DAILY. 180 tablet 2  . LANTUS SOLOSTAR 100 UNIT/ML Solostar Pen INJECT 10 UNITS INTO THE SKIN AT BEDTIME. 15 mL 2  . Multiple Vitamin (MULTIVITAMIN) capsule Take 1 capsule by mouth daily.     . nitroGLYCERIN (NITROSTAT) 0.4 MG SL tablet Place 1 tablet (0.4 mg total) under the tongue every 5 (five) minutes x 3 doses as needed for chest pain. 25 tablet 2  . spironolactone (ALDACTONE) 25 MG tablet TAKE 1 TABLET (25 MG TOTAL) BY MOUTH DAILY. 90 tablet 0  .  warfarin (COUMADIN) 5 MG tablet TAKE AS DIRECTED BY ANTICOAGULATION CLINIC 180 tablet 1   No current facility-administered medications for this visit.     Past Medical History  Diagnosis Date  . Chronic systolic CHF (congestive heart failure) ([place])     a. Likely NICM (out of proportion to CAD). b. 2009 - EF 25-30% by echo, 01/2013: 15-20% by cath. c. 11/2014 Echo: EF 35-40%, Gr1 DD, mild MR, mod TR, PASP 34mmHg.  [name] Gout   . Hypokalemia   . HTN (hypertension)   . Lipoma   . Dyslipidemia   . CAD (coronary artery disease)     a. Initial nonobst by cath 2009. b. Cath 01/2013 in setting of VT storm: obstructive distal Cx disease (small and terminates in the AV groove, unlikely to cause significant ischemia or electrical instability), nonobstructive RCA disease, EF 15-20%.   [name] PAF (paroxysmal atrial fibrillation) ([place])     a. Noted 05/2008 by EKG;  b. CHA2DS2VASc = 5-6-->coumadin.  . Paroxysmal VT ([place])     a. s/p St. Jude ICD 2007. b. H/o paroxysmal VT/VF including VT storm 12/2012 admission prompting amio initiation;  c. 01/2013 ICD upgrade SJM 1411-36Q Ellipse VR single lead ICD.  [name] Nonischemic cardiomyopathy ([place])     a.  11/2014 Echo: EF 35-40%, Gr1 DD, mild MR, mod  TR, PASP 71mmHg.  . ICD (implantable cardiac defibrillator) in place   . OSA (obstructive sleep apnea)     does not wear cpap  . Insulin dependent diabetes mellitus ([place])   . Cerebrovascular accident [facility])     a. 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 Pulmonary HTN ([place])     a. Mild by cath 01/2013.  . CKD (chronic kidney disease), stage II     ROS:   All systems reviewed and negative except as noted in the HPI.   Past Surgical History  Procedure Laterality Date  . Cardiac catheterization      Nonobstructive coronary disease 2009  . Cardiac defibrillator placement      ICD-St. Jude  . Lipoma excision    . Left and right heart catheterization with coronary angiogram N/A 01/03/2013    Procedure: LEFT AND RIGHT HEART  CATHETERIZATION WITH CORONARY ANGIOGRAM;  Surgeon: Peter M Martinique, MD;  Location: [facility] CATH LAB;  Service: Cardiovascular;  Laterality: N/A;  . Implantable cardioverter defibrillator (icd) generator change N/A 01/15/2014    Procedure: ICD GENERATOR CHANGE;  Surgeon: Evans Lance, MD;  Location: [facility] CATH LAB;  Service: Cardiovascular;  Laterality: N/A;     Family History  Problem Relation Age of Onset  . Coronary artery disease Mother   . Heart attack Mother   . Heart attack Maternal Uncle   . Heart attack Maternal Grandmother      Social History   Social History  . Marital Status: Married    Spouse Name: N/A  . Number of Children: 2  . Years of Education: N/A   Occupational History  . custodian    Social History Main Topics  . Smoking status: Former Smoker    Quit date: 05/04/1986  . Smokeless tobacco: Never Used     Comment: Quit smoking 30 yrs ago. Smoked as teenager less than 1/2 ppd. Smoked x 4 years.  . Alcohol Use: No     Comment: occasionally  . Drug Use: No  . Sexual Activity: No   Other Topics Concern  . Not on file   Social History Narrative     BP 120/82 mmHg  Pulse 70  Ht 6' (1.829 m)  Wt 267 lb 12.8 oz (121.473 kg)  BMI 36.31 kg/m2  SpO2 92%  Physical Exam:  Well appearing middle-aged man, NAD HEENT: Unremarkable Neck:  6 cm JVD, no thyromegall Back:  No CVA tenderness Lungs:  Clear with no wheezes, rales, or rhonchi. HEART:  IRegular rate rhythm, no murmurs, no rubs, no clicks Abd:  soft, positive bowel sounds, no organomegally, no rebound, no guarding Ext:  2 plus pulses, no edema, no cyanosis, no clubbing Skin:  No rashes no nodules Neuro:  CN II through XII intact, motor grossly intact   DEVICE  Normal device function.  See PaceArt for details.   Assess/Plan:

## 2015-05-30 NOTE — Patient Instructions (Signed)
Medication Instructions:  Your physician recommends that you continue on your current medications as directed. Please refer to the Current Medication list given to you today.   Labwork: None ordered   Testing/Procedures: None ordered   Follow-Up: Your physician wants you to follow-up in: 12 months with Dr Knox Saliva will receive a reminder letter in the mail two months in advance. If you don't receive a letter, please call our office to schedule the follow-up appointment.   Remote monitoring is used to monitor your  ICD from home. This monitoring reduces the number of office visits required to check your device to one time per year. It allows [place] to keep an eye on the functioning of your device to ensure it is working properly. You are scheduled for a device check from home on 08/27/15. You may send your transmission at any time that day. If you have a wireless device, the transmission will be sent automatically. After your physician reviews your transmission, you will receive a postcard with your next transmission date.    Any Other Special Instructions Will Be Listed Below (If Applicable).     If you need a refill on your cardiac medications before your next appointment, please call your pharmacy.

## 2015-06-21 ENCOUNTER — Other Ambulatory Visit: Payer: Self-pay | Admitting: Cardiology

## 2015-06-21 ENCOUNTER — Other Ambulatory Visit: Payer: Self-pay | Admitting: *Deleted

## 2015-06-21 MED ORDER — NITROGLYCERIN 0.4 MG SL SUBL: 0.4000 mg | SUBLINGUAL_TABLET | SUBLINGUAL | Status: DC | PRN

## 2015-06-21 NOTE — Telephone Encounter (Signed)
Rx(s) sent to pharmacy electronically.  

## 2015-07-17 ENCOUNTER — Ambulatory Visit (INDEPENDENT_AMBULATORY_CARE_PROVIDER_SITE_OTHER): Payer: Medicare Other | Admitting: Internal Medicine

## 2015-07-17 ENCOUNTER — Encounter: Payer: Self-pay | Admitting: Internal Medicine

## 2015-07-17 ENCOUNTER — Telehealth: Payer: Self-pay

## 2015-07-17 ENCOUNTER — Ambulatory Visit (INDEPENDENT_AMBULATORY_CARE_PROVIDER_SITE_OTHER): Payer: Medicare Other | Admitting: General Practice

## 2015-07-17 ENCOUNTER — Other Ambulatory Visit (INDEPENDENT_AMBULATORY_CARE_PROVIDER_SITE_OTHER): Payer: Medicare Other

## 2015-07-17 VITALS — BP 122/76 | HR 78 | Temp 98.6°F | Resp 20 | Wt 267.0 lb

## 2015-07-17 DIAGNOSIS — Z794 Long term (current) use of insulin: Secondary | ICD-10-CM

## 2015-07-17 DIAGNOSIS — E785 Hyperlipidemia, unspecified: Secondary | ICD-10-CM | POA: Diagnosis not present

## 2015-07-17 DIAGNOSIS — I4891 Unspecified atrial fibrillation: Secondary | ICD-10-CM

## 2015-07-17 DIAGNOSIS — Z5181 Encounter for therapeutic drug level monitoring: Secondary | ICD-10-CM | POA: Diagnosis not present

## 2015-07-17 DIAGNOSIS — I110 Hypertensive heart disease with heart failure: Secondary | ICD-10-CM | POA: Diagnosis not present

## 2015-07-17 DIAGNOSIS — Z0000 Encounter for general adult medical examination without abnormal findings: Secondary | ICD-10-CM

## 2015-07-17 DIAGNOSIS — E119 Type 2 diabetes mellitus without complications: Secondary | ICD-10-CM | POA: Diagnosis not present

## 2015-07-17 DIAGNOSIS — Z8679 Personal history of other diseases of the circulatory system: Secondary | ICD-10-CM | POA: Diagnosis not present

## 2015-07-17 LAB — LIPID PANEL
CHOLESTEROL: 155 mg/dL (ref 0–200)
HDL: 41 mg/dL (ref >39.00)
LDL Cholesterol: 102 mg/dL — ABNORMAL HIGH (ref 0–99)
NonHDL: 113.58
Total CHOL/HDL Ratio: 4
Triglycerides: 60 mg/dL (ref 0.0–149.0)
VLDL: 12 mg/dL (ref 0.0–40.0)

## 2015-07-17 LAB — CBC WITH DIFFERENTIAL/PLATELET
BASOS ABS: 0 10*3/uL (ref 0.0–0.1)
Basophils Relative: 0.3 % (ref 0.0–3.0)
EOS PCT: 0.9 % (ref 0.0–5.0)
Eosinophils Absolute: 0.1 10*3/uL (ref 0.0–0.7)
HCT: 44 % (ref 39.0–52.0)
HEMOGLOBIN: 14.4 g/dL (ref 13.0–17.0)
Lymphocytes Relative: 15.3 % (ref 12.0–46.0)
Lymphs Abs: 0.9 10*3/uL (ref 0.7–4.0)
MCHC: 32.7 g/dL (ref 30.0–36.0)
MCV: 96.9 fl (ref 78.0–100.0)
MONO ABS: 0.6 10*3/uL (ref 0.1–1.0)
Monocytes Relative: 9.6 % (ref 3.0–12.0)
Neutro Abs: 4.5 10*3/uL (ref 1.4–7.7)
Neutrophils Relative %: 73.9 % (ref 43.0–77.0)
Platelets: 201 10*3/uL (ref 150.0–400.0)
RBC: 4.54 Mil/uL (ref 4.22–5.81)
RDW: 14.8 % (ref 11.5–15.5)
WBC: 6 10*3/uL (ref 4.0–10.5)

## 2015-07-17 LAB — URINALYSIS, ROUTINE W REFLEX MICROSCOPIC
BILIRUBIN URINE: NEGATIVE
HGB URINE DIPSTICK: NEGATIVE
KETONES UR: NEGATIVE
LEUKOCYTES UA: NEGATIVE
NITRITE: NEGATIVE
PH: 6.5 (ref 5.0–8.0)
Specific Gravity, Urine: 1.015 (ref 1.000–1.030)
URINE GLUCOSE: NEGATIVE
UROBILINOGEN UA: 2 — AB (ref 0.0–1.0)

## 2015-07-17 LAB — BASIC METABOLIC PANEL
BUN: 18 mg/dL (ref 6–23)
CHLORIDE: 109 meq/L (ref 96–112)
CO2: 28 meq/L (ref 19–32)
Calcium: 9.5 mg/dL (ref 8.4–10.5)
Creatinine, Ser: 1.37 mg/dL (ref 0.40–1.50)
GFR: 68.34 mL/min (ref >60.00)
GLUCOSE: 111 mg/dL — AB (ref 70–99)
Potassium: 4.3 mEq/L (ref 3.5–5.1)
SODIUM: 146 meq/L — AB (ref 135–145)

## 2015-07-17 LAB — HEPATIC FUNCTION PANEL
ALBUMIN: 4.3 g/dL (ref 3.5–5.2)
ALT: 32 U/L (ref 0–53)
AST: 27 U/L (ref 0–37)
Alkaline Phosphatase: 78 U/L (ref 39–117)
Bilirubin, Direct: 0.3 mg/dL (ref 0.0–0.3)
TOTAL PROTEIN: 7.7 g/dL (ref 6.0–8.3)
Total Bilirubin: 1.4 mg/dL — ABNORMAL HIGH (ref 0.2–1.2)

## 2015-07-17 LAB — HEMOGLOBIN A1C: Hgb A1c MFr Bld: 5.5 % (ref 4.6–6.5)

## 2015-07-17 LAB — MICROALBUMIN / CREATININE URINE RATIO
CREATININE, U: 291.9 mg/dL
Microalb Creat Ratio: 0.6 mg/g (ref 0.0–30.0)
Microalb, Ur: 1.7 mg/dL (ref 0.0–1.9)

## 2015-07-17 LAB — TSH: TSH: 0.95 u[IU]/mL (ref 0.35–4.50)

## 2015-07-17 LAB — PSA: PSA: 0.44 ng/mL (ref 0.10–4.00)

## 2015-07-17 LAB — POCT INR: INR: 3.1

## 2015-07-17 MED ORDER — AMLODIPINE BESYLATE 10 MG PO TABS
10.0000 mg | ORAL_TABLET | Freq: Every day | ORAL | Status: DC
Start: 2015-07-17 — End: 2015-11-21

## 2015-07-17 MED ORDER — NITROGLYCERIN 0.4 MG SL SUBL: 0.4000 mg | SUBLINGUAL_TABLET | SUBLINGUAL | Status: DC | PRN

## 2015-07-17 MED ORDER — ATORVASTATIN CALCIUM 80 MG PO TABS: 80.0000 mg | ORAL_TABLET | Freq: Every day | ORAL | Status: DC

## 2015-07-17 MED ORDER — POTASSIUM CHLORIDE CRYS ER 20 MEQ PO TBCR: EXTENDED_RELEASE_TABLET | ORAL | Status: DC

## 2015-07-17 MED ORDER — FUROSEMIDE 80 MG PO TABS: ORAL_TABLET | ORAL | Status: DC

## 2015-07-17 MED ORDER — CARVEDILOL 25 MG PO TABS: ORAL_TABLET | ORAL | Status: DC

## 2015-07-17 MED ORDER — AMIODARONE HCL 200 MG PO TABS: ORAL_TABLET | ORAL | Status: DC

## 2015-07-17 MED ORDER — SPIRONOLACTONE 25 MG PO TABS: ORAL_TABLET | ORAL | Status: DC

## 2015-07-17 MED ORDER — ALLOPURINOL 300 MG PO TABS: ORAL_TABLET | ORAL | Status: DC

## 2015-07-17 NOTE — Progress Notes (Signed)
I have reviewed and agree with the plan. 

## 2015-07-17 NOTE — Patient Instructions (Signed)

## 2015-07-17 NOTE — Progress Notes (Signed)
Pre visit review using our clinic review tool, if applicable. No additional management support is needed unless otherwise documented below in the visit note. 

## 2015-07-17 NOTE — Telephone Encounter (Signed)
Medications ordered

## 2015-07-17 NOTE — Progress Notes (Signed)
Subjective:    Patient ID: [name], male    DOB: Jul 11, 1955, 60 y.o.   MRN: [id number]  HPI  Here for wellness and f/u;  Overall doing ok;  Pt denies Chest pain, worsening SOB, DOE, wheezing, orthopnea, PND, worsening LE edema, palpitations, dizziness or syncope.  Pt denies neurological change such as new headache, facial or extremity weakness.  Pt denies polydipsia, polyuria, or low sugar symptoms. Pt states overall good compliance with treatment and medications, good tolerability, and has been trying to follow appropriate diet.  Pt denies worsening depressive symptoms, suicidal ideation or panic. No fever, night sweats, wt loss, loss of appetite, or other constitutional symptoms.  Pt states good ability with ADL's, has low fall risk, home safety reviewed and adequate, no other significant changes in hearing or vision, and only occasionally active with exercise.  No current complaints.  No ST, cough, fever Past Medical History  Diagnosis Date  . Chronic systolic CHF (congestive heart failure) ([place])     a. Likely NICM (out of proportion to CAD). b. 2009 - EF 25-30% by echo, 01/2013: 15-20% by cath. c. 11/2014 Echo: EF 35-40%, Gr1 DD, mild MR, mod TR, PASP 36mmHg.  [name] Gout   . Hypokalemia   . HTN (hypertension)   . Lipoma   . Dyslipidemia   . CAD (coronary artery disease)     a. Initial nonobst by cath 2009. b. Cath 01/2013 in setting of VT storm: obstructive distal Cx disease (small and terminates in the AV groove, unlikely to cause significant ischemia or electrical instability), nonobstructive RCA disease, EF 15-20%.   [name] PAF (paroxysmal atrial fibrillation) ([place])     a. Noted 05/2008 by EKG;  b. CHA2DS2VASc = 5-6-->coumadin.  . Paroxysmal VT ([place])     a. s/p St. Jude ICD 2007. b. H/o paroxysmal VT/VF including VT storm 12/2012 admission prompting amio initiation;  c. 01/2013 ICD upgrade SJM 1411-36Q Ellipse VR single lead ICD.  [name] Nonischemic cardiomyopathy ([place])     a.  11/2014 Echo: EF  35-40%, Gr1 DD, mild MR, mod TR, PASP 58mmHg.  . ICD (implantable cardiac defibrillator) in place   . OSA (obstructive sleep apnea)     does not wear cpap  . Insulin dependent diabetes mellitus ([place])   . Cerebrovascular accident [facility])     a. 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 Pulmonary HTN ([place])     a. Mild by cath 01/2013.  . CKD (chronic kidney disease), stage II    Past Surgical History  Procedure Laterality Date  . Cardiac catheterization      Nonobstructive coronary disease 2009  . Cardiac defibrillator placement      ICD-St. Jude  . Lipoma excision    . Left and right heart catheterization with coronary angiogram N/A 01/03/2013    Procedure: LEFT AND RIGHT HEART CATHETERIZATION WITH CORONARY ANGIOGRAM;  Surgeon: Peter M Martinique, MD;  Location: [facility] CATH LAB;  Service: Cardiovascular;  Laterality: N/A;  . Implantable cardioverter defibrillator (icd) generator change N/A 01/15/2014    Procedure: ICD GENERATOR CHANGE;  Surgeon: Evans Lance, MD;  Location: [facility] CATH LAB;  Service: Cardiovascular;  Laterality: N/A;    reports that he quit smoking about 29 years ago. He has never used smokeless tobacco. He reports that he does not drink alcohol or use illicit drugs. family history includes Coronary artery disease in his mother; Heart attack in his maternal grandmother, maternal uncle, and mother. No Known Allergies Current Outpatient Prescriptions on  File Prior to Visit  Medication Sig Dispense Refill  . hydrALAZINE (APRESOLINE) 50 MG tablet TAKE 1 TABLET (50 MG TOTAL) BY MOUTH EVERY 8 (EIGHT) HOURS. 270 tablet 0  . isosorbide dinitrate (ISORDIL) 20 MG tablet TAKE 1 TABLET (20 MG TOTAL) BY MOUTH 3 (THREE) TIMES DAILY. 270 tablet 0  . LANTUS SOLOSTAR 100 UNIT/ML Solostar Pen INJECT 10 UNITS INTO THE SKIN AT BEDTIME. 15 mL 2  . Multiple Vitamin (MULTIVITAMIN) capsule Take 1 capsule by mouth daily.     [name] warfarin (COUMADIN) 5 MG tablet TAKE AS DIRECTED BY ANTICOAGULATION CLINIC 180 tablet 1     No current facility-administered medications on file prior to visit.   Review of Systems Constitutional: Negative for increased diaphoresis, other activity, appetite or siginficant weight change other than noted HENT: Negative for worsening hearing loss, ear pain, facial swelling, mouth sores and neck stiffness.   Eyes: Negative for other worsening pain, redness or visual disturbance.  Respiratory: Negative for shortness of breath and wheezing  Cardiovascular: Negative for chest pain and palpitations.  Gastrointestinal: Negative for diarrhea, blood in stool, abdominal distention or other pain Genitourinary: Negative for hematuria, flank pain or change in urine volume.  Musculoskeletal: Negative for myalgias or other joint complaints.  Skin: Negative for color change and wound or drainage.  Neurological: Negative for syncope and numbness. other than noted Hematological: Negative for adenopathy. or other swelling Psychiatric/Behavioral: Negative for hallucinations, SI, self-injury, decreased concentration or other worsening agitation.      Objective:   Physical Exam BP 122/76 mmHg  Pulse 78  Temp(Src) 98.6 F (37 C) (Oral)  Resp 20  Wt 267 lb (121.11 kg)  SpO2 91% VS noted,  Constitutional: Pt is oriented to person, place, and time. Appears well-developed and well-nourished, in no significant distress Head: Normocephalic and atraumatic.  Right Ear: External ear normal.  Left Ear: External ear normal.  Nose: Nose normal.  Mouth/Throat: Oropharynx is clear and moist.  Eyes: Conjunctivae and EOM are normal. Pupils are equal, round, and reactive to light.  Neck: Normal range of motion. Neck supple. No JVD present. No tracheal deviation present or significant neck LA or mass Cardiovascular: Normal rate, regular rhythm, normal heart sounds and intact distal pulses.   Pulmonary/Chest: Effort normal and breath sounds without rales or wheezing  Abdominal: Soft. Bowel sounds are normal.  NT. No HSM  Musculoskeletal: Normal range of motion. Exhibits no edema.  Lymphadenopathy:  Has no cervical adenopathy.  Neurological: Pt is alert and oriented to person, place, and time. Pt has normal reflexes. No cranial nerve deficit. Motor grossly intact Skin: Skin is warm and dry. No rash noted.  Psychiatric:  Has normal mood and affect. Behavior is normal.     Assessment & Plan:

## 2015-07-18 LAB — HEPATITIS C ANTIBODY: HCV AB: NEGATIVE

## 2015-07-20 NOTE — Assessment & Plan Note (Signed)
stable overall by history and exam, recent data reviewed with pt, and pt to continue medical treatment as before,  to f/u any worsening symptoms or concerns Lab Results  Component Value Date   LDLCALC 102* 07/17/2015

## 2015-07-20 NOTE — Assessment & Plan Note (Signed)
stable overall by history and exam, recent data reviewed with pt, and pt to continue medical treatment as before,  to f/u any worsening symptoms or concerns Lab Results  Component Value Date   HGBA1C 5.5 07/17/2015

## 2015-07-20 NOTE — Assessment & Plan Note (Signed)
stable overall by history and exam, recent data reviewed with pt, and pt to continue medical treatment as before,  to f/u any worsening symptoms or concerns BP Readings from Last 3 Encounters:  07/17/15 122/76  05/30/15 120/82  05/27/15 112/80

## 2015-07-20 NOTE — Assessment & Plan Note (Signed)

## 2015-08-01 DIAGNOSIS — E119 Type 2 diabetes mellitus without complications: Secondary | ICD-10-CM | POA: Diagnosis not present

## 2015-08-01 DIAGNOSIS — H2513 Age-related nuclear cataract, bilateral: Secondary | ICD-10-CM | POA: Diagnosis not present

## 2015-08-04 ENCOUNTER — Other Ambulatory Visit: Payer: Self-pay | Admitting: Cardiology

## 2015-08-20 ENCOUNTER — Other Ambulatory Visit: Payer: Self-pay | Admitting: *Deleted

## 2015-08-20 MED ORDER — GLUCOSE BLOOD VI STRP: 1.0000 | ORAL_STRIP | Freq: Every day | Status: DC

## 2015-08-20 MED ORDER — ACCU-CHEK SOFTCLIX LANCETS MISC: Status: DC

## 2015-08-20 NOTE — Telephone Encounter (Signed)
Left msg on triage requesting rx for pt Smartview Nano testing strips & lancets. Can fax to [phone].caled order into [facility] gave verbal ok to fill...[name]

## 2015-08-21 ENCOUNTER — Ambulatory Visit (INDEPENDENT_AMBULATORY_CARE_PROVIDER_SITE_OTHER): Payer: Medicare Other | Admitting: Family

## 2015-08-21 ENCOUNTER — Encounter: Payer: Self-pay | Admitting: Family

## 2015-08-21 VITALS — BP 120/90 | HR 81 | Temp 98.3°F | Ht 72.0 in | Wt 271.0 lb

## 2015-08-21 DIAGNOSIS — I509 Heart failure, unspecified: Secondary | ICD-10-CM

## 2015-08-21 NOTE — Progress Notes (Signed)
Pre visit review using our clinic review tool, if applicable. No additional management support is needed unless otherwise documented below in the visit note. 

## 2015-08-21 NOTE — Progress Notes (Signed)
Subjective:    Patient ID: [name], male    DOB: [birth year], 60 y.o.   MRN: [id number]   [name] is a 60 y.o. male who presents today for an acute visit.    HPI Comments: Patient here for evaluation of cough and SOB for five days, both have resolved. He was told to come here in the context of his heart failure with any cough. Took whole tab lasix last night and feels like he can breathe much better. LE swelling much improved on lasix. Been taking Mucinex with relief.  Patient uses scale from [facility] for heart failure program; last weight was 269.8 and after whole tab of lasix decreased to 263.4.  Denies fever, shortness of breath, orthopnea. Denies exertional chest pain or pressure, numbness or tingling radiating to left arm or jaw, palpitations, dizziness, frequent headaches, changes in vision, or shortness of breath.     Filed Weights   08/21/15 1053  Weight: 271 lb (122.925 kg)    Past Medical History  Diagnosis Date  . Chronic systolic CHF (congestive heart failure) ([name])     a. Likely NICM (out of proportion to CAD). b. 2009 - EF 25-30% by echo, 01/2013: 15-20% by cath. c. 11/2014 Echo: EF 35-40%, Gr1 DD, mild MR, mod TR, PASP 63mmHg.  [name] Gout   . Hypokalemia   . HTN (hypertension)   . Lipoma   . Dyslipidemia   . CAD (coronary artery disease)     a. Initial nonobst by cath 2009. b. Cath 01/2013 in setting of VT storm: obstructive distal Cx disease (small and terminates in the AV groove, unlikely to cause significant ischemia or electrical instability), nonobstructive RCA disease, EF 15-20%.   [name] PAF (paroxysmal atrial fibrillation) ([name])     a. Noted 05/2008 by EKG;  b. CHA2DS2VASc = 5-6-->coumadin.  . Paroxysmal VT ([name])     a. s/p St. Jude ICD 2007. b. H/o paroxysmal VT/VF including VT storm 12/2012 admission prompting amio initiation;  c. 01/2013 ICD upgrade SJM 1411-36Q Ellipse VR single lead ICD.  [name] Nonischemic cardiomyopathy ([name])     a.  11/2014 Echo: EF 35-40%, Gr1  DD, mild MR, mod TR, PASP 38mmHg.  . ICD (implantable cardiac defibrillator) in place   . OSA (obstructive sleep apnea)     does not wear cpap  . Insulin dependent diabetes mellitus ([name])   . Cerebrovascular accident [name])     a. 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 Pulmonary HTN ([name])     a. Mild by cath 01/2013.  . CKD (chronic kidney disease), stage II    Review of patient's allergies indicates no known allergies. Current Outpatient Prescriptions on File Prior to Visit  Medication Sig Dispense Refill  . ACCU-CHEK SOFTCLIX LANCETS lancets Use to help check blood sugar once a day Dx e11.9 100 each 3  . allopurinol (ZYLOPRIM) 300 MG tablet TAKE 1 TABLET (300 MG TOTAL) BY MOUTH DAILY. 90 tablet 2  . amiodarone (PACERONE) 200 MG tablet TAKE 1 TABLET (200 MG TOTAL) BY MOUTH DAILY. 90 tablet 1  . amLODipine (NORVASC) 10 MG tablet Take 1 tablet (10 mg total) by mouth daily. 90 tablet 0  . atorvastatin (LIPITOR) 80 MG tablet Take 1 tablet (80 mg total) by mouth at bedtime. 90 tablet 2  . carvedilol (COREG) 25 MG tablet TAKE 2 TABLETS (50 MG TOTAL) BY MOUTH 2 (TWO) TIMES DAILY WITH A MEAL. 360 tablet 3  . furosemide (LASIX) 80 MG tablet  TAKE 1 TABLET (80 MG TOTAL) BY MOUTH DAILY. 90 tablet 4  . glucose blood (ACCU-CHEK SMARTVIEW) test strip 1 each by Other route daily. Use to check blood sugar once a day Dx e11.9 100 each 3  . hydrALAZINE (APRESOLINE) 50 MG tablet TAKE 1 TABLET (50 MG TOTAL) BY MOUTH EVERY 8 (EIGHT) HOURS. 270 tablet 0  . isosorbide dinitrate (ISORDIL) 20 MG tablet TAKE 1 TABLET (20 MG TOTAL) BY MOUTH 3 (THREE) TIMES DAILY. 270 tablet 0  . LANTUS SOLOSTAR 100 UNIT/ML Solostar Pen INJECT 10 UNITS INTO THE SKIN AT BEDTIME. 15 mL 2  . Multiple Vitamin (MULTIVITAMIN) capsule Take 1 capsule by mouth daily.     . nitroGLYCERIN (NITROSTAT) 0.4 MG SL tablet Place 1 tablet (0.4 mg total) under the tongue every 5 (five) minutes x 3 doses as needed for chest pain. 25 tablet 1  . potassium  chloride SA (KLOR-CON M20) 20 MEQ tablet TAKE 2 TABLETS (40 MEQ TOTAL) BY MOUTH DAILY. 180 tablet 2  . spironolactone (ALDACTONE) 25 MG tablet TAKE 1 TABLET (25 MG TOTAL) BY MOUTH DAILY. 90 tablet 0  . warfarin (COUMADIN) 5 MG tablet TAKE AS DIRECTED BY ANTICOAGULATION CLINIC 180 tablet 1   No current facility-administered medications on file prior to visit.    Social History  Substance Use Topics  . Smoking status: Former Smoker    Quit date: 05/04/1986  . Smokeless tobacco: Never Used     Comment: Quit smoking 30 yrs ago. Smoked as teenager less than 1/2 ppd. Smoked x 4 years.  . Alcohol Use: No     Comment: occasionally    Review of Systems  Constitutional: Negative for fever and chills.  HENT: Negative for congestion, ear pain, rhinorrhea, sinus pressure and sore throat.   Respiratory: Negative for cough, shortness of breath and wheezing.   Cardiovascular: Positive for leg swelling (chronic; wearing compression hose; improved today). Negative for chest pain and palpitations.  Gastrointestinal: Negative for nausea, vomiting and diarrhea.  Musculoskeletal: Negative for myalgias.  Neurological: Negative for headaches.      Objective:    BP 120/90 mmHg  Pulse 81  Temp(Src) 98.3 F (36.8 C) (Oral)  Ht 6' (1.829 m)  Wt 271 lb (122.925 kg)  BMI 36.75 kg/m2  SpO2 90%   Physical Exam  Constitutional: Vital signs are normal. He appears well-developed and well-nourished.  HENT:  Head: Normocephalic and atraumatic.  Right Ear: Hearing, tympanic membrane, external ear and ear canal normal. No drainage, swelling or tenderness. Tympanic membrane is not injected, not erythematous and not bulging. No middle ear effusion. No decreased hearing is noted.  Left Ear: Hearing, tympanic membrane, external ear and ear canal normal. No drainage, swelling or tenderness. Tympanic membrane is not injected, not erythematous and not bulging.  No middle ear effusion. No decreased hearing is noted.    Nose: Nose normal. Right sinus exhibits no maxillary sinus tenderness and no frontal sinus tenderness. Left sinus exhibits no maxillary sinus tenderness and no frontal sinus tenderness.  Mouth/Throat: Uvula is midline, oropharynx is clear and moist and mucous membranes are normal. No oropharyngeal exudate, posterior oropharyngeal edema, posterior oropharyngeal erythema or tonsillar abscesses.  Eyes: Conjunctivae are normal.  Cardiovascular: Regular rhythm and normal heart sounds.   Trace, nonpitting lower extremity edema. Warm, palpable pulses.  Pulmonary/Chest: Effort normal and breath sounds normal. No respiratory distress. He has no wheezes. He has no rhonchi. He has no rales.  Lymphadenopathy:       Head (right  side): No submental, no submandibular, no tonsillar, no preauricular, no posterior auricular and no occipital adenopathy present.       Head (left side): No submental, no submandibular, no tonsillar, no preauricular, no posterior auricular and no occipital adenopathy present.    He has no cervical adenopathy.  Neurological: He is alert.  Skin: Skin is warm and dry.  Psychiatric: He has a normal mood and affect. His speech is normal and behavior is normal.  Vitals reviewed.      Assessment & Plan:    1. Chronic heart failure, unspecified heart failure type ([place]) Very reassured by patient's resolution of cough, orthopnea, shortness of breath, and approx 6 pound loss after 80 mg dose of Lasix. His lung sounds are clear and there is only trace swelling in bilateral legs. Please see how compliant patient is on heart failure program through insurance company and reassured him that he looks great today. Of note, patient's potassium was 4.3 on 3/15. As the potassium was mid range, I do not see a clinical reason to check potassium today after Lasix dose.   I am having Mr. Essa maintain his multivitamin, warfarin, hydrALAZINE, isosorbide dinitrate, LANTUS SOLOSTAR, allopurinol, amiodarone,  amLODipine, atorvastatin, carvedilol, furosemide, spironolactone, nitroGLYCERIN, potassium chloride SA, ACCU-CHEK SOFTCLIX LANCETS, and glucose blood.   Start medications as prescribed and explained to patient on After Visit Summary ( AVS). Risks, benefits, and alternatives of the medications and treatment plan prescribed today were discussed, and patient expressed understanding.   Education regarding symptom management and diagnosis given to patient.   Follow-up:Plan follow-up as discussed or as needed if any worsening symptoms or change in condition. No Follow-up on file.   Continue to follow with Cathlean Cower, MD for routine health maintenance.   Theron Arista and I agreed with plan.   Mable Paris, FNP

## 2015-08-21 NOTE — Patient Instructions (Signed)
Continue to all the great things you are doing :)   If there is no improvement in your symptoms, or if there is any worsening of symptoms, or if you have any additional concerns, please return for re-evaluation; or, if we are closed, consider going to the Emergency Room for evaluation if symptoms urgent.  Insuficincia cardaca (Heart Failure) A insuficincia cardaca  uma doena que faz com que o corao tenha problemas para bombear o sangue. Isso significa que seu corao no bombeia o sangue com eficincia para que seu corpo funcione corretamente. Em alguns casos de insuficincia cardaca, pode haver acmulo de lquido nos pulmes ou voc pode apresentar inchao (edema) nas pernas. A insuficincia cardaca  geralmente uma doena de longa durao (crnica).  importante que voc se cuide bem e siga o plano de tratamento de seu mdico. CAUSAS  Alguns quadros clnicos podem causar a insuficincia cardaca. Esses quadros incluem:  Presso arterial elevada (hipertenso). A hipertenso faz com que o msculo cardaco trabalhe mais que o normal. Quando a presso nos vasos sanguneos est elevada, o corao precisa bombear (contrair-se) com mais fora para fazer o Pharmacist, community do corpo. A presso arterial elevada acaba enrijecendo e enfraquecendo o corao.  Doena arterial coronariana [facility]) A doena arterial coronariana (DAC)  o acmulo de colesterol e gordura (placas) nas artrias do corao. A obstruo das artrias impede que o corao receba oxignio e sangue. Isso pode causar dor no peito e pode levar a um infarto. A presso arterial elevada tambm pode contribuir para a DAC.  Ataque cardaco (infarto do miocrdio). Um infarto do miocrdio ocorre quando uma ou mais artrias do corao so bloqueadas. A perda de oxignio danifica o tecido muscular do corao. Quando isso acontece, parte do msculo cardaco morre. O tecido danificado no se contrai devidamente e diminui a capacidade de o  corao bombear sangue.  Valvas cardacas anormais Quando as valvas cardacas no se abrem e fecham corretamente, isso pode causar insuficincia cardaca. Isso obriga o msculo cardaco a bombear com mais fora para Catering manager o sangue fluindo.  Doena do msculo cardaco (cardiomiopatia ou miocardite). A doena do msculo cardaco  o dano ao msculo cardaco por diversas causas. Essas causas podem incluir abuso de lcool ou drogas, infeces e motivos desconhecidos. Esses fatores podem aumentar o risco de insuficincia cardaca.  Doena pulmonar. Doenas pulmonares obrigam o corao a trabalhar mais, j que os pulmes no funcionam devidamente. Isso pode forar o corao, levando-o  insuficincia.  Diabetes. A diabetes aumenta o risco de insuficincia cardaca. O excesso de acar no sangue contribui para o aumento do nvel de gordura (lipdios) no sangue. A diabetes tambm pode causar danos lentamente a vasos sanguneos menores que transportam importantes nutrientes ao msculo cardaco. Quando o corao no recebe oxignio ou alimento suficiente, pode enfraquecer e enrijecer. Isso faz com que ele no se contraia de maneira eficiente.  Outros quadros podem contribuir para a insuficincia cardaca. Entre eles esto ritmos cardacos anormais, problemas da tireoide e baixas contagens sanguneas (anemia). Certos comportamentos no saudveis podem aumentar o risco de insuficincia cardaca, incluindo:  Sobrepeso.  Fumar ou mascar tabaco.  Consumir alimentos ricos em gordura e colesterol.  Abusar de lcool ou drogas.  Levar uma vida sedentria. SINTOMAS  Os sintomas da insuficincia cardaca variam e podem ser difceis de detectar. Eles podem incluir:  Falta de ar ao praticar atividades fsicas, como subir escadas.  Tosse persistente.  Inchao nos ps, tornozelos, pernas ou abdome.  Ganho de peso inexplicvel.  Dificuldade para  respirar quando deitado de costas (ortopneia).  Acordar com  necessidade de se sentar para Multimedia programmer.  Acelerao dos batimentos cardacos.  Cansao e perda de energia.  Sensao de confuso mental, tontura ou quase desmaio.  Perda de apetite.  Nuseas.  Aumento da vontade de urinar durante a noite (noctria). DIAGNSTICO  O diagnstico de insuficincia cardaca  feito com base no seu histrico, sintomas, exames fsicos e exames diagnsticos. Os exames diagnsticos para a insuficincia cardaca podem incluir:  Ecocardiografia.  Eletrocardiografia.  Radiografia do trax.  Exames de sangue.  Teste de esforo  Angiografia cardaca.  Tomografia por emisso de psitrons. TRATAMENTO  O tratamento consiste em controlar os sintomas da insuficincia cardaca. Medicamentos, mudanas de comportamento ou interveno cirrgica podem ser necessrios para se tratar a insuficincia cardaca.  Os medicamentos que auxiliam o tratamento de insuficincia cardaca podem incluir:  Inibidores da enzima Electrical engineer angiotensina (ECA). Esse tipo de medicamento bloqueia o efeito de uma protena do sangue chamada de enzima Electrical engineer angiotensina. Inibidores da ECA relaxam (dilatam) os vasos sanguneos e ajudam a diminuir a presso arterial.  Bloqueadores do receptor da angiotensina (BRAs) Esse tipo de medicamento bloqueia a ao de uma protena do sangue chamada angiotensina. Os bloqueadores do receptor da angiotensina dilatam os vasos sanguneos e ajudam a diminuir a presso arterial.  Plulas d'gua (diurticos). Os diurticos fazem com que os rins removam sal e gua do sangue. O fluido extra  removido pela urina. A perda de fluidos extras diminui o volume de sangue bombeado pelo corao.  Betabloqueadores. Eles impedem que o corao bata muito rapidamente e aumentam a fora do msculo cardaco.  Digitlicos. Eles aumentam a fora do batimento cardaco.  Mudanas saudveis de comportamento incluem:  Obter e manter um peso saudvel.  Parar  de fumar ou mascar tabaco.  Consumir alimentos que faam bem ao corao.  Limitar ou abster-se do consumo de lcool.  Interromper o uso de drogas.  Exerccios fsicos conforme orientao de seu mdico.  O tratamento cirrgico para a insuficincia cardaca pode incluir:  Um procedimento para abrir artrias obstrudas, reparar valvas cardacas danificadas ou remover tecido do msculo cardaco danificado.  Um marca-passo para melhorar o funcionamento do msculo cardaco e controlar certos ritmos anormais.  Um desfibrilador cardioversor implantvel para tratar certos ritmos cardacos anormais graves.  Um dispositivo de assistncia ventricular esquerda para auxiliar a capacidade de bombeamento do corao. [place] medicamentos somente conforme orientado por seu mdico. Os medicamentos so importantes para reduzir a carga de trabalho do corao, retardam a progresso da insuficincia cardaca e melhoram os sintomas.  No pare de tomar seus medicamentos at que seja orientado por seu mdico.  No pule nenhuma dose de medicamento.  Solicite novas receitas antes do fim do medicamento. Sua medicao  necessria todos os dias.  Faa atividades fsicas moderadas conforme orientado por seu mdico. A atividade fsica moderada pode beneficiar algumas pessoas. Pessoas idosas e aquelas com insuficincia cardaca grave devem consultar o mdico para obter recomendaes sobre atividades fsicas.  Consuma alimentos que faam bem ao corao. A escolha de alimentos deve ser livre de gordura trans e ter baixo teor de gordura saturada, colesterol e sal (sdio). Alimentos saudveis incluem frutas e verduras frescas ou congeladas, peixe, carnes magras, legumes, laticnios de zero ou [place], e gros integrais ou alimentos ricos em fibras. [place] com um nutricionista para aprender [employer] alimentos saudveis para o corao.  Limite o sdio conforme orientado por  seu mdico. A restrio  de sdio pode reduzir os sintomas de insuficincia cardaca em alguns indivduos. [place] com um nutricionista para aprender [employer] temperos saudveis para o corao.  Use mtodos culinrios saudveis. Mtodos culinrios saudveis incluem assar, grelhar, torrar, cozer, escaldar, cozinhar no vapor e refogar. [place] com um nutricionista para aprender [employer] mtodos culinrios saudveis.  Limite o consumo de lquidos conforme orientado por seu mdico. A restrio de fluidos pode reduzir os sintomas de insuficincia cardaca em alguns indivduos.  Pese todos os dias. Isso  importante para identificar precocemente o excesso de fluidos. Voc deve se pesar todas as manhs depois de urinar e antes de tomar o caf da manh. [place] a mesma quantidade de roupas todas as vezes que se pesar. Anote seu peso todos os dias. Compartilhe as informaes sobre seu peso com seu mdico.  Monitore e registre sua presso arterial conforme orientado por seu mdico.  Verifique seu pulso conforme orientado por seu mdico.  Perca peso conforme orientado por seu mdico. A perda de peso pode reduzir os sintomas de insuficincia cardaca em alguns indivduos.  Pare de fumar ou mascar tabaco. A nicotina faz seu corao trabalhar mais, pois contrai os vasos sanguneos. No use chicletes nem adesivos de nicotina antes de conversar com seu mdico.  Comparea a todas as consultas de acompanhamento, conforme orientado por seu mdico. Isso  importante.  Limite o consumo de bebidas alcolicas para, no mximo, 1 dose por dia para mulheres no grvidas e 2 doses por dia para homens. (1 dose = 148 ml de vinho, 355 ml de cerveja ou 44 ml de licor. [place] mais do que isso  prejudicial ao corao. Informe seu mdico se voc consumir bebidas alcolicas vrias vezes por semana. [place] com seu mdico para saber se o lcool  seguro para voc. Se seu corao j tiver sido danificado pelo lcool ou se voc  tiver insuficincia cardaca grave, deve parar completamente o consumo de lcool.  Interrompa o uso de drogas.  Fique atualizado quanto a imunizaes.  especialmente importante prevenir infeces respiratrias por [employer] das atuais vacinas contra gripe e pneumoccicas.  Cuide de [employer] hipertenso, diabetes, doena da tireoide ou arritmia cardaca seguindo as orientaes de seu mdico.  Aprenda a lidar com o estresse.  Planeje perodos de descanso quando Teacher, early years/pre.  Aprenda estratgias para lidar com temperaturas elevadas. Se o clima estiver extremamente quente:  Evite atividades fsicas extenuantes.  Use ar condicionado ou ventiladores ou busque um local mais arejado.  Evite cafena e lcool.  Use roupas largas, leves e de cores claras.  Aprenda estratgias para lidar com temperaturas baixas. Se o clima estiver extremamente frio:  Evite atividades fsicas extenuantes.  Use roupas sobrepostas.  Quando for [employer], use luvas, chapu e cachecol.  Evite as bebidas alcolicas.  Mantenha-se informado e obtenha ajuda conforme necessrio.  Participe ou busque reabilitao conforme necessrio para manter ou aumentar sua independncia e qualidade de vida. PROCURE UM MDICO SE:   Ganhar peso rapidamente.  Tiver falta de ar crescente que no lhe for habitual.  No conseguir praticar suas atividades fsicas normais.  Cansar-se facilmente.  Tossir mais que o normal, Psychologist, counselling atividades fsicas.  Houver qualquer inchao ou aumento do inchao em reas como mos, ps, tornozelos ou abdome.  No conseguir dormir devido a dificuldades respiratrias.  Sentir como se seu corao estivesse batendo acelerado (palpitaes).  Sentir tonturas ou Recruitment consultant. PROCURE UM MDICO IMEDIATAMENTE SE:   Tiver dificuldade para respirar.  Houver mudanas de  estado mental, tal como falta de ateno ou dificuldade para concentrar-se.  Sentir  dor ou desconforto no peito.  Tiver um episdio de desmaio (sncope). CERTIFIQUE-SE DE:   Compreender estas instrues.  Observar seu estado de sade.  Procurar ajuda imediatamente se no se sentir bem ou piorar.   Estas informaes no se destinam a substituir as recomendaes de seu mdico. No deixe de discutir quaisquer dvidas com seu mdico.   Document Released: 04/20/2005 Document Revised: 09/04/2014 Elsevier Interactive Patient Education [employer].

## 2015-08-28 ENCOUNTER — Ambulatory Visit (INDEPENDENT_AMBULATORY_CARE_PROVIDER_SITE_OTHER): Payer: Medicare Other | Admitting: General Practice

## 2015-08-28 DIAGNOSIS — Z8679 Personal history of other diseases of the circulatory system: Secondary | ICD-10-CM | POA: Diagnosis not present

## 2015-08-28 DIAGNOSIS — Z5181 Encounter for therapeutic drug level monitoring: Secondary | ICD-10-CM

## 2015-08-28 DIAGNOSIS — I4891 Unspecified atrial fibrillation: Secondary | ICD-10-CM | POA: Diagnosis not present

## 2015-08-28 LAB — POCT INR: INR: 3

## 2015-08-28 NOTE — Progress Notes (Signed)
I have reviewed and agree with the plan. 

## 2015-08-28 NOTE — Progress Notes (Signed)
Pre visit review using our clinic review tool, if applicable. No additional management support is needed unless otherwise documented below in the visit note. 

## 2015-08-29 ENCOUNTER — Ambulatory Visit (INDEPENDENT_AMBULATORY_CARE_PROVIDER_SITE_OTHER): Payer: Medicare Other | Admitting: *Deleted

## 2015-08-29 DIAGNOSIS — I428 Other cardiomyopathies: Secondary | ICD-10-CM

## 2015-08-29 DIAGNOSIS — I429 Cardiomyopathy, unspecified: Secondary | ICD-10-CM | POA: Diagnosis not present

## 2015-08-29 DIAGNOSIS — I5022 Chronic systolic (congestive) heart failure: Secondary | ICD-10-CM

## 2015-08-29 DIAGNOSIS — Z95810 Presence of automatic (implantable) cardiac defibrillator: Secondary | ICD-10-CM | POA: Diagnosis not present

## 2015-08-29 NOTE — Progress Notes (Signed)
Remote ICD transmission.   

## 2015-09-05 ENCOUNTER — Telehealth: Payer: Self-pay | Admitting: Internal Medicine

## 2015-09-05 NOTE — Telephone Encounter (Signed)
Ok to take both as he has been stable, thanks

## 2015-09-05 NOTE — Telephone Encounter (Signed)
[facility] called stated pt transfer pharmacy and Amiodarone is interact with Warfarin. Please advise.

## 2015-09-06 NOTE — Telephone Encounter (Signed)
Spoke to pharmacy, clarification given

## 2015-09-14 ENCOUNTER — Other Ambulatory Visit: Payer: Self-pay | Admitting: Cardiology

## 2015-09-16 NOTE — Telephone Encounter (Signed)
Rx request sent to pharmacy.  

## 2015-10-02 ENCOUNTER — Other Ambulatory Visit: Payer: Self-pay | Admitting: Internal Medicine

## 2015-10-09 ENCOUNTER — Ambulatory Visit (INDEPENDENT_AMBULATORY_CARE_PROVIDER_SITE_OTHER): Payer: Medicare Other | Admitting: General Practice

## 2015-10-09 ENCOUNTER — Encounter: Payer: Self-pay | Admitting: Cardiology

## 2015-10-09 DIAGNOSIS — Z5181 Encounter for therapeutic drug level monitoring: Secondary | ICD-10-CM

## 2015-10-09 DIAGNOSIS — I4891 Unspecified atrial fibrillation: Secondary | ICD-10-CM

## 2015-10-09 DIAGNOSIS — Z8679 Personal history of other diseases of the circulatory system: Secondary | ICD-10-CM

## 2015-10-09 LAB — POCT INR: INR: 3.2

## 2015-10-09 NOTE — Progress Notes (Signed)
Pre visit review using our clinic review tool, if applicable. No additional management support is needed unless otherwise documented below in the visit note. 

## 2015-10-09 NOTE — Progress Notes (Signed)
I have reviewed and agree with the plan. 

## 2015-10-10 LAB — CUP PACEART REMOTE DEVICE CHECK
Battery Voltage: 3.02 V
Brady Statistic RV Percent Paced: 1 %
Date Time Interrogation Session: 20170425084601
HighPow Impedance: 46 Ohm
HighPow Impedance: 46 Ohm
Implantable Lead Implant Date: 20070817
Lead Channel Impedance Value: 410 Ohm
Lead Channel Setting Sensing Sensitivity: 0.5 mV
MDC IDC LEAD LOCATION: 753860
MDC IDC LEAD MODEL: 7001
MDC IDC MSMT BATTERY REMAINING LONGEVITY: 86 mo
MDC IDC MSMT BATTERY REMAINING PERCENTAGE: 85 %
MDC IDC MSMT LEADCHNL RV SENSING INTR AMPL: 12 mV
MDC IDC SET LEADCHNL RV PACING AMPLITUDE: 2.5 V
MDC IDC SET LEADCHNL RV PACING PULSEWIDTH: 0.7 ms
Pulse Gen Serial Number: 7214340

## 2015-10-25 ENCOUNTER — Encounter: Payer: Self-pay | Admitting: Cardiology

## 2015-11-06 ENCOUNTER — Ambulatory Visit (INDEPENDENT_AMBULATORY_CARE_PROVIDER_SITE_OTHER): Payer: Medicare Other | Admitting: General Practice

## 2015-11-06 DIAGNOSIS — I4891 Unspecified atrial fibrillation: Secondary | ICD-10-CM | POA: Diagnosis not present

## 2015-11-06 DIAGNOSIS — Z5181 Encounter for therapeutic drug level monitoring: Secondary | ICD-10-CM | POA: Diagnosis not present

## 2015-11-06 DIAGNOSIS — Z8679 Personal history of other diseases of the circulatory system: Secondary | ICD-10-CM | POA: Diagnosis not present

## 2015-11-06 LAB — POCT INR: INR: 2.6

## 2015-11-06 NOTE — Progress Notes (Signed)
Pre visit review using our clinic review tool, if applicable. No additional management support is needed unless otherwise documented below in the visit note. 

## 2015-11-21 ENCOUNTER — Other Ambulatory Visit: Payer: Self-pay | Admitting: Internal Medicine

## 2015-11-28 ENCOUNTER — Ambulatory Visit (INDEPENDENT_AMBULATORY_CARE_PROVIDER_SITE_OTHER): Payer: Medicare Other | Admitting: *Deleted

## 2015-11-28 DIAGNOSIS — I429 Cardiomyopathy, unspecified: Secondary | ICD-10-CM

## 2015-11-28 DIAGNOSIS — Z95810 Presence of automatic (implantable) cardiac defibrillator: Secondary | ICD-10-CM

## 2015-11-28 DIAGNOSIS — I5022 Chronic systolic (congestive) heart failure: Secondary | ICD-10-CM

## 2015-11-28 DIAGNOSIS — I428 Other cardiomyopathies: Secondary | ICD-10-CM

## 2015-11-28 NOTE — Progress Notes (Signed)
HPI The patient presents for follow up of CHF.  He was hospitalized with pneumonia and sepsis in July 2016. There was a mild troponin elevation. He had some renal insufficiency. His EF was stable at 35-40% during that hospitalization.  I have seen him back in follow up since then.  In Oct 2016 he was doing well and was exercising at the [facility]. Since I saw him he has had one office visit for increased weight and fluid and was seen by his primary care physician. He has not been back to see [place].  He weighs himself every day with scales from [place] [facility].  His weights have gone up at times and he will take extra for half dose Lasix. This seems to bring him back down although his weight is up today from what I think his baseline should be. His new baseline at home is more like 269. He was 263 in the past.  He is watching his salt and trying to watch what he is. He denies any chest pressure, neck or arm discomfort. He's not had any palpitations, presyncope or syncope. He denies any PND or orthopnea.  No Known Allergies  Current Outpatient Prescriptions  Medication Sig Dispense Refill  . ACCU-CHEK SOFTCLIX LANCETS lancets Use to help check blood sugar once a day Dx e11.9 100 each 3  . allopurinol (ZYLOPRIM) 300 MG tablet TAKE 1 TABLET (300 MG TOTAL) BY MOUTH DAILY. 90 tablet 2  . amiodarone (PACERONE) 200 MG tablet TAKE 1 TABLET (200 MG TOTAL) BY MOUTH DAILY. 90 tablet 1  . amLODipine (NORVASC) 10 MG tablet TAKE 1 TABLET (10 MG TOTAL) BY MOUTH DAILY. 90 tablet 0  . atorvastatin (LIPITOR) 80 MG tablet Take 1 tablet (80 mg total) by mouth at bedtime. 90 tablet 2  . carvedilol (COREG) 25 MG tablet TAKE 2 TABLETS (50 MG TOTAL) BY MOUTH 2 (TWO) TIMES DAILY WITH A MEAL. 360 tablet 3  . furosemide (LASIX) 80 MG tablet TAKE 1 TABLET (80 MG TOTAL) BY MOUTH DAILY. 90 tablet 4  . glucose blood (ACCU-CHEK SMARTVIEW) test strip 1 each by Other route daily. Use to check blood sugar once a day Dx e11.9 100  each 3  . hydrALAZINE (APRESOLINE) 50 MG tablet TAKE 1 TABLET (50 MG TOTAL) BY MOUTH EVERY 8 (EIGHT) HOURS. 270 tablet 1  . isosorbide dinitrate (ISORDIL) 20 MG tablet TAKE 1 TABLET (20 MG TOTAL) BY MOUTH 3 (THREE) TIMES DAILY. 270 tablet 0  . LANTUS SOLOSTAR 100 UNIT/ML Solostar Pen INJECT 10 UNITS INTO THE SKIN AT BEDTIME. 15 mL 2  . Multiple Vitamin (MULTIVITAMIN) capsule Take 1 capsule by mouth daily.     . nitroGLYCERIN (NITROSTAT) 0.4 MG SL tablet Place 1 tablet (0.4 mg total) under the tongue every 5 (five) minutes x 3 doses as needed for chest pain. 25 tablet 1  . potassium chloride SA (KLOR-CON M20) 20 MEQ tablet TAKE 2 TABLETS (40 MEQ TOTAL) BY MOUTH DAILY. 180 tablet 2  . spironolactone (ALDACTONE) 25 MG tablet TAKE 1 TABLET (25 MG TOTAL) BY MOUTH DAILY. 90 tablet 0  . warfarin (COUMADIN) 5 MG tablet TAKE AS DIRECTED BY ANTICOAGULATION CLINIC 180 tablet 1   No current facility-administered medications for this visit.     Past Medical History:  Diagnosis Date  . CAD (coronary artery disease)    a. Initial nonobst by cath 2009. b. Cath 01/2013 in setting of VT storm: obstructive distal Cx disease (small and terminates in the AV  groove, unlikely to cause significant ischemia or electrical instability), nonobstructive RCA disease, EF 15-20%.   . Cerebrovascular accident [facility])    a. 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 Chronic systolic CHF (congestive heart failure) ([place])    a. Likely NICM (out of proportion to CAD). b. 2009 - EF 25-30% by echo, 01/2013: 15-20% by cath. c. 11/2014 Echo: EF 35-40%, Gr1 DD, mild MR, mod TR, PASP 41mmHg.  . CKD (chronic kidney disease), stage II   . Dyslipidemia   . Gout   . HTN (hypertension)   . Hypokalemia   . ICD (implantable cardiac defibrillator) in place   . Insulin dependent diabetes mellitus ([place])   . Lipoma   . Nonischemic cardiomyopathy ([place])    a.  11/2014 Echo: EF 35-40%, Gr1 DD, mild MR, mod TR, PASP 20mmHg.  . OSA (obstructive sleep  apnea)    does not wear cpap  . PAF (paroxysmal atrial fibrillation) ([place])    a. Noted 05/2008 by EKG;  b. CHA2DS2VASc = 5-6-->coumadin.  . Paroxysmal VT ([place])    a. s/p St. Jude ICD 2007. b. H/o paroxysmal VT/VF including VT storm 12/2012 admission prompting amio initiation;  c. 01/2013 ICD upgrade SJM [id number] Ellipse VR single lead ICD.  [name] Pulmonary HTN ([place])    a. Mild by cath 01/2013.    Past Surgical History:  Procedure Laterality Date  . CARDIAC CATHETERIZATION     Nonobstructive coronary disease 2009  . CARDIAC DEFIBRILLATOR PLACEMENT     ICD-St. Jude  . IMPLANTABLE CARDIOVERTER DEFIBRILLATOR (ICD) GENERATOR CHANGE N/A 01/15/2014   Procedure: ICD GENERATOR CHANGE;  Surgeon: Evans Lance, MD;  Location: [facility] C[facility] LAB;  Service: Cardiovascular;  Laterality: N/A;  . LEFT AND RIGHT HEART CATHETERIZATION WITH CORONARY ANGIOGRAM N/A 01/03/2013   Procedure: LEFT AND RIGHT HEART CATHETERIZATION WITH CORONARY ANGIOGRAM;  Surgeon: Peter M Martinique, MD;  Location: [facility] CATH LAB;  Service: Cardiovascular;  Laterality: N/A;  . LIPOMA EXCISION     ROS:  As stated in the HPI and negative for all other systems.  PHYSICAL EXAM BP 116/80   Pulse 78   Ht 6' (1.829 m)   Wt 278 lb (126.1 kg)   BMI 37.70 kg/m  GENERAL:  Well appearing NECK:  No jugular venous distention, waveform within normal limits, carotid upstroke brisk and symmetric, no bruits, no thyromegaly LUNGS:  Clear to auscultation bilaterally BACK:  No CVA tenderness CHEST:  Unremarkable, well-healed ICD pocket HEART:  PMI not displaced or sustained,S1 and S2 within normal limits, no S3, no S4, no clicks, no rubs, no murmurs ABD:  Flat, positive bowel sounds normal in frequency in pitch, no bruits, no rebound, no guarding, no midline pulsatile mass, no hepatomegaly, no splenomegaly EXT:  2 plus pulses throughout, no edema, no cyanosis no clubbing   ASSESSMENT AND PLAN  Congestive heart failure, systolic-  I don't like the fact that  his weight is up on our scales to 278. He does probably have a little extra fluid that was not obvious on exam.  He is going to take an extra half of Lasix for the next 3 days and then I will check a basic metabolic profile. He's going to continue to watch his salt and fluid.  HYPERTENSION -  His blood pressures being treated in the context of treating his congestive heart failure.  Atrial fibrillation-  He has had no symptomatic paroxysms of this. He's not interested in changing from warfarin to another agent.  He is  up-to-date with his labs for his amiodarone labs although I will check these when he returns for the next office visit.[name] No change in therapy is indicated.   Lab Results  Component Value Date   TSH 0.95 07/17/2015   ALT 32 07/17/2015   AST 27 07/17/2015   ALKPHOS 78 07/17/2015   BILITOT 1.4 (H) 07/17/2015   PROT 7.7 07/17/2015   ALBUMIN 4.3 07/17/2015   Lab Results  Component Value Date   HGBA1C 5.5 07/17/2015    CKD II - This is stable.  No change in therapy is indicated.   I will check the BMET Lab Results  Component Value Date   CREATININE 1.37 07/17/2015      HYPERLIPIDEMIA - His LDL was recently 102.  He will continue with meds as listed.   Lab Results  Component Value Date   CHOL 155 07/17/2015   TRIG 60.0 07/17/2015   HDL 41.00 07/17/2015   LDLCALC 102 (H) 07/17/2015   LDLDIRECT 194.6 05/09/2007    ICD - He is up to date with EP follow up.   OBESITY - The patient understands the need to lose weight with diet and exercise.

## 2015-11-28 NOTE — Progress Notes (Signed)
Remote ICD transmission.   

## 2015-11-29 ENCOUNTER — Encounter: Payer: Self-pay | Admitting: Cardiology

## 2015-11-29 ENCOUNTER — Ambulatory Visit (INDEPENDENT_AMBULATORY_CARE_PROVIDER_SITE_OTHER): Payer: Medicare Other | Admitting: Cardiology

## 2015-11-29 VITALS — BP 116/80 | HR 78 | Ht 72.0 in | Wt 278.0 lb

## 2015-11-29 DIAGNOSIS — I5023 Acute on chronic systolic (congestive) heart failure: Secondary | ICD-10-CM | POA: Diagnosis not present

## 2015-11-29 DIAGNOSIS — Z79899 Other long term (current) drug therapy: Secondary | ICD-10-CM | POA: Diagnosis not present

## 2015-11-29 DIAGNOSIS — I428 Other cardiomyopathies: Secondary | ICD-10-CM

## 2015-11-29 DIAGNOSIS — I429 Cardiomyopathy, unspecified: Secondary | ICD-10-CM

## 2015-11-29 NOTE — Patient Instructions (Signed)
Medication Instructions:  Take an extra tablets of lasix for 3 days  Labwork: BMP on Monday  Testing/Procedures: NONE  Follow-Up: Your physician recommends that you schedule a follow-up appointment in: 4 Months   Any Other Special Instructions Will Be Listed Below (If Applicable).   If you need a refill on your cardiac medications before your next appointment, please call your pharmacy.

## 2015-12-02 DIAGNOSIS — Z79899 Other long term (current) drug therapy: Secondary | ICD-10-CM | POA: Diagnosis not present

## 2015-12-03 ENCOUNTER — Telehealth: Payer: Self-pay | Admitting: Cardiology

## 2015-12-03 DIAGNOSIS — Z79899 Other long term (current) drug therapy: Secondary | ICD-10-CM

## 2015-12-03 LAB — BASIC METABOLIC PANEL
BUN: 24 mg/dL (ref 7–25)
CO2: 29 mmol/L (ref 20–31)
Calcium: 8.9 mg/dL (ref 8.6–10.3)
Chloride: 105 mmol/L (ref 98–110)
Creat: 1.6 mg/dL — ABNORMAL HIGH (ref 0.70–1.33)
GLUCOSE: 121 mg/dL — AB (ref 65–99)
POTASSIUM: 3.9 mmol/L (ref 3.5–5.3)
SODIUM: 144 mmol/L (ref 135–146)

## 2015-12-03 NOTE — Telephone Encounter (Signed)
Pt aware of his blood work, lab was ordered and lab slip mail to pt.

## 2015-12-03 NOTE — Telephone Encounter (Signed)
F/u message ° °Pt returning RN call. Please call back to discuss  °

## 2015-12-04 ENCOUNTER — Ambulatory Visit (INDEPENDENT_AMBULATORY_CARE_PROVIDER_SITE_OTHER): Payer: Medicare Other | Admitting: General Practice

## 2015-12-04 DIAGNOSIS — Z5181 Encounter for therapeutic drug level monitoring: Secondary | ICD-10-CM

## 2015-12-04 DIAGNOSIS — I4891 Unspecified atrial fibrillation: Secondary | ICD-10-CM | POA: Diagnosis not present

## 2015-12-04 DIAGNOSIS — Z8679 Personal history of other diseases of the circulatory system: Secondary | ICD-10-CM

## 2015-12-04 LAB — POCT INR: INR: 2.7

## 2015-12-04 NOTE — Progress Notes (Signed)
I have reviewed and agree with the plan. 

## 2015-12-05 LAB — CUP PACEART REMOTE DEVICE CHECK
Battery Remaining Longevity: 85 mo
Battery Remaining Percentage: 84 %
Battery Voltage: 2.99 V
Brady Statistic RV Percent Paced: 1 %
Date Time Interrogation Session: 20170727060027
HIGH POWER IMPEDANCE MEASURED VALUE: 45 Ohm
HighPow Impedance: 45 Ohm
Lead Channel Impedance Value: 400 Ohm
Lead Channel Pacing Threshold Amplitude: 1.25 V
Lead Channel Setting Sensing Sensitivity: 0.5 mV
MDC IDC LEAD IMPLANT DT: 20070817
MDC IDC LEAD LOCATION: 753860
MDC IDC LEAD MODEL: 7001
MDC IDC MSMT LEADCHNL RV PACING THRESHOLD PULSEWIDTH: 0.7 ms
MDC IDC MSMT LEADCHNL RV SENSING INTR AMPL: 12 mV
MDC IDC PG SERIAL: 7214340
MDC IDC SET LEADCHNL RV PACING AMPLITUDE: 2.5 V
MDC IDC SET LEADCHNL RV PACING PULSEWIDTH: 0.7 ms

## 2015-12-06 ENCOUNTER — Other Ambulatory Visit: Payer: Self-pay | Admitting: Internal Medicine

## 2015-12-06 NOTE — Telephone Encounter (Signed)
I believe coumadin refills are normally done through coumadin clinic

## 2015-12-17 ENCOUNTER — Encounter: Payer: Self-pay | Admitting: Cardiology

## 2015-12-17 DIAGNOSIS — Z79899 Other long term (current) drug therapy: Secondary | ICD-10-CM | POA: Diagnosis not present

## 2015-12-18 LAB — BASIC METABOLIC PANEL
BUN: 19 mg/dL (ref 7–25)
CHLORIDE: 108 mmol/L (ref 98–110)
CO2: 26 mmol/L (ref 20–31)
Calcium: 8.9 mg/dL (ref 8.6–10.3)
Creat: 1.37 mg/dL — ABNORMAL HIGH (ref 0.70–1.33)
GLUCOSE: 97 mg/dL (ref 65–99)
POTASSIUM: 4.3 mmol/L (ref 3.5–5.3)
SODIUM: 142 mmol/L (ref 135–146)

## 2016-01-01 ENCOUNTER — Other Ambulatory Visit: Payer: Self-pay | Admitting: Cardiology

## 2016-01-08 ENCOUNTER — Ambulatory Visit (INDEPENDENT_AMBULATORY_CARE_PROVIDER_SITE_OTHER): Payer: Medicare Other | Admitting: General Practice

## 2016-01-08 DIAGNOSIS — Z8679 Personal history of other diseases of the circulatory system: Secondary | ICD-10-CM

## 2016-01-08 DIAGNOSIS — Z5181 Encounter for therapeutic drug level monitoring: Secondary | ICD-10-CM

## 2016-01-08 LAB — POCT INR: INR: 2.8

## 2016-01-08 NOTE — Progress Notes (Signed)
I have reviewed and agree with the plan. 

## 2016-01-15 ENCOUNTER — Other Ambulatory Visit (INDEPENDENT_AMBULATORY_CARE_PROVIDER_SITE_OTHER): Payer: Medicare Other

## 2016-01-15 ENCOUNTER — Ambulatory Visit (INDEPENDENT_AMBULATORY_CARE_PROVIDER_SITE_OTHER): Payer: Medicare Other | Admitting: Internal Medicine

## 2016-01-15 ENCOUNTER — Encounter: Payer: Self-pay | Admitting: Internal Medicine

## 2016-01-15 VITALS — BP 140/80 | HR 74 | Temp 98.5°F | Resp 20 | Wt 282.0 lb

## 2016-01-15 DIAGNOSIS — E785 Hyperlipidemia, unspecified: Secondary | ICD-10-CM

## 2016-01-15 DIAGNOSIS — Z0001 Encounter for general adult medical examination with abnormal findings: Secondary | ICD-10-CM

## 2016-01-15 DIAGNOSIS — I110 Hypertensive heart disease with heart failure: Secondary | ICD-10-CM

## 2016-01-15 DIAGNOSIS — Z794 Long term (current) use of insulin: Secondary | ICD-10-CM

## 2016-01-15 DIAGNOSIS — E119 Type 2 diabetes mellitus without complications: Secondary | ICD-10-CM

## 2016-01-15 DIAGNOSIS — R6889 Other general symptoms and signs: Secondary | ICD-10-CM

## 2016-01-15 LAB — BASIC METABOLIC PANEL
BUN: 16 mg/dL (ref 6–23)
CHLORIDE: 107 meq/L (ref 96–112)
CO2: 31 meq/L (ref 19–32)
Calcium: 9 mg/dL (ref 8.4–10.5)
Creatinine, Ser: 1.45 mg/dL (ref 0.40–1.50)
GFR: 63.9 mL/min (ref >60.00)
GLUCOSE: 96 mg/dL (ref 70–99)
POTASSIUM: 4.3 meq/L (ref 3.5–5.1)
Sodium: 143 mEq/L (ref 135–145)

## 2016-01-15 LAB — LIPID PANEL
CHOL/HDL RATIO: 4
CHOLESTEROL: 151 mg/dL (ref 0–200)
HDL: 36.2 mg/dL — ABNORMAL LOW (ref >39.00)
LDL Cholesterol: 103 mg/dL — ABNORMAL HIGH (ref 0–99)
NonHDL: 115.13
TRIGLYCERIDES: 59 mg/dL (ref 0.0–149.0)
VLDL: 11.8 mg/dL (ref 0.0–40.0)

## 2016-01-15 LAB — HEPATIC FUNCTION PANEL
ALT: 22 U/L (ref 0–53)
AST: 22 U/L (ref 0–37)
Albumin: 3.9 g/dL (ref 3.5–5.2)
Alkaline Phosphatase: 71 U/L (ref 39–117)
BILIRUBIN TOTAL: 1.3 mg/dL — AB (ref 0.2–1.2)
Bilirubin, Direct: 0.3 mg/dL (ref 0.0–0.3)
TOTAL PROTEIN: 7.5 g/dL (ref 6.0–8.3)

## 2016-01-15 LAB — HEMOGLOBIN A1C: HEMOGLOBIN A1C: 5.5 % (ref 4.6–6.5)

## 2016-01-15 NOTE — Assessment & Plan Note (Signed)
stable overall by history and exam, recent data reviewed with pt, and pt to continue medical treatment as before,  to f/u any worsening symptoms or concerns Lab Results  Component Value Date   HGBA1C 5.5 01/15/2016

## 2016-01-15 NOTE — Assessment & Plan Note (Signed)
stable overall by history and exam, recent data reviewed with pt, and pt to continue medical treatment as before,  to f/u any worsening symptoms or concerns BP Readings from Last 3 Encounters:  01/15/16 140/80  11/29/15 116/80  08/21/15 120/90

## 2016-01-15 NOTE — Assessment & Plan Note (Signed)
stable overall by history and exam, recent data reviewed with pt, and pt to continue medical treatment as before,  to f/u any worsening symptoms or concerns Lab Results  Component Value Date   LDLCALC 103 (H) 01/15/2016

## 2016-01-15 NOTE — Progress Notes (Signed)
Subjective:    Patient ID: [name], male    DOB: [birth year], 60 y.o.   MRN: [id number]  HPI  Here to f/u; overall doing ok,  Pt denies chest pain, increasing sob or doe, wheezing, orthopnea, PND, increased LE swelling, palpitations, dizziness or syncope.  Pt denies new neurological symptoms such as new headache, or facial or extremity weakness or numbness.  Pt denies polydipsia, polyuria, or low sugar episode.   Pt denies new neurological symptoms such as new headache, or facial or extremity weakness or numbness.   Pt states overall good compliance with meds, mostly trying to follow appropriate diet, with wt overall stable,  but little exercise however.  Already had flu shot at work this season.   Past Medical History:  Diagnosis Date  . CAD (coronary artery disease)    a. Initial nonobst by cath 2009. b. Cath 01/2013 in setting of VT storm: obstructive distal Cx disease (small and terminates in the AV groove, unlikely to cause significant ischemia or electrical instability), nonobstructive RCA disease, EF 15-20%.   . Cerebrovascular accident [facility])    a. 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 Chronic systolic CHF (congestive heart failure) ([place])    a. Likely NICM (out of proportion to CAD). b. 2009 - EF 25-30% by echo, 01/2013: 15-20% by cath. c. 11/2014 Echo: EF 35-40%, Gr1 DD, mild MR, mod TR, PASP 46mmHg.  . CKD (chronic kidney disease), stage II   . Dyslipidemia   . Gout   . HTN (hypertension)   . Hypokalemia   . ICD (implantable cardiac defibrillator) in place   . Insulin dependent diabetes mellitus ([place])   . Lipoma   . Nonischemic cardiomyopathy ([place])    a.  11/2014 Echo: EF 35-40%, Gr1 DD, mild MR, mod TR, PASP 89mmHg.  . OSA (obstructive sleep apnea)    does not wear cpap  . PAF (paroxysmal atrial fibrillation) ([place])    a. Noted 05/2008 by EKG;  b. CHA2DS2VASc = 5-6-->coumadin.  . Paroxysmal VT ([place])    a. s/p St. Jude ICD 2007. b. H/o paroxysmal VT/VF including VT storm 12/2012  admission prompting amio initiation;  c. 01/2013 ICD upgrade SJM 1411-36Q Ellipse VR single lead ICD.  [name] Pulmonary HTN ([place])    a. Mild by cath 01/2013.   Past Surgical History:  Procedure Laterality Date  . CARDIAC CATHETERIZATION     Nonobstructive coronary disease 2009  . CARDIAC DEFIBRILLATOR PLACEMENT     ICD-St. Jude  . IMPLANTABLE CARDIOVERTER DEFIBRILLATOR (ICD) GENERATOR CHANGE N/A 01/15/2014   Procedure: ICD GENERATOR CHANGE;  Surgeon: Evans Lance, MD;  Location: [facility] CATH LAB;  Service: Cardiovascular;  Laterality: N/A;  . LEFT AND RIGHT HEART CATHETERIZATION WITH CORONARY ANGIOGRAM N/A 01/03/2013   Procedure: LEFT AND RIGHT HEART CATHETERIZATION WITH CORONARY ANGIOGRAM;  Surgeon: Peter M Martinique, MD;  Location: [facility] CATH LAB;  Service: Cardiovascular;  Laterality: N/A;  . LIPOMA EXCISION      reports that he quit smoking about 29 years ago. He has never used smokeless tobacco. He reports that he does not drink alcohol or use drugs. family history includes Coronary artery disease in his mother; Heart attack in his maternal grandmother, maternal uncle, and mother. No Known Allergies Current Outpatient Prescriptions on File Prior to Visit  Medication Sig Dispense Refill  . ACCU-CHEK SOFTCLIX LANCETS lancets Use to help check blood sugar once a day Dx e11.9 100 each 3  . allopurinol (ZYLOPRIM) 300 MG tablet TAKE 1 TABLET (  300 MG TOTAL) BY MOUTH DAILY. 90 tablet 2  . amiodarone (PACERONE) 200 MG tablet TAKE 1 TABLET (200 MG TOTAL) BY MOUTH DAILY. 90 tablet 1  . amLODipine (NORVASC) 10 MG tablet TAKE 1 TABLET (10 MG TOTAL) BY MOUTH DAILY. 90 tablet 0  . atorvastatin (LIPITOR) 80 MG tablet Take 1 tablet (80 mg total) by mouth at bedtime. 90 tablet 2  . carvedilol (COREG) 25 MG tablet TAKE 2 TABLETS (50 MG TOTAL) BY MOUTH 2 (TWO) TIMES DAILY WITH A MEAL. 360 tablet 3  . furosemide (LASIX) 80 MG tablet TAKE 1 TABLET (80 MG TOTAL) BY MOUTH DAILY. 90 tablet 4  . glucose blood (ACCU-CHEK  SMARTVIEW) test strip 1 each by Other route daily. Use to check blood sugar once a day Dx e11.9 100 each 3  . hydrALAZINE (APRESOLINE) 50 MG tablet TAKE 1 TABLET (50 MG TOTAL) BY MOUTH EVERY 8 (EIGHT) HOURS. 270 tablet 1  . isosorbide dinitrate (ISORDIL) 20 MG tablet TAKE 1 TABLET (20 MG TOTAL) BY MOUTH 3 (THREE) TIMES DAILY. 270 tablet 2  . LANTUS SOLOSTAR 100 UNIT/ML Solostar Pen INJECT 10 UNITS INTO THE SKIN AT BEDTIME. 15 mL 2  . Multiple Vitamin (MULTIVITAMIN) capsule Take 1 capsule by mouth daily.     . nitroGLYCERIN (NITROSTAT) 0.4 MG SL tablet Place 1 tablet (0.4 mg total) under the tongue every 5 (five) minutes x 3 doses as needed for chest pain. 25 tablet 1  . potassium chloride SA (KLOR-CON M20) 20 MEQ tablet TAKE 2 TABLETS (40 MEQ TOTAL) BY MOUTH DAILY. 180 tablet 2  . spironolactone (ALDACTONE) 25 MG tablet TAKE 1 TABLET (25 MG TOTAL) BY MOUTH DAILY. 90 tablet 0  . warfarin (COUMADIN) 5 MG tablet TAKE AS DIRECTED BY ANTICOAGULATION CLINIC 180 tablet 1   No current facility-administered medications on file prior to visit.    Review of Systems  Constitutional: Negative for unusual diaphoresis or night sweats HENT: Negative for ear swelling or discharge Eyes: Negative for worsening visual haziness  Respiratory: Negative for choking and stridor.   Gastrointestinal: Negative for distension or worsening eructation Genitourinary: Negative for retention or change in urine volume.  Musculoskeletal: Negative for other MSK pain or swelling Skin: Negative for color change and worsening wound Neurological: Negative for tremors and numbness other than noted  Psychiatric/Behavioral: Negative for decreased concentration or agitation other than above       Objective:   Physical Exam BP 140/80   Pulse 74   Temp 98.5 F (36.9 C) (Oral)   Resp 20   Wt 282 lb (127.9 kg)   SpO2 96%   BMI 38.25 kg/m  VS noted,  Constitutional: Pt appears in no apparent distress HENT: Head: NCAT.  Right  Ear: External ear normal.  Left Ear: External ear normal.  Eyes: . Pupils are equal, round, and reactive to light. Conjunctivae and EOM are normal Neck: Normal range of motion. Neck supple.  Cardiovascular: Normal rate and regular rhythm.   Pulmonary/Chest: Effort normal and breath sounds without rales or wheezing.  Abd:  Soft, NT, ND, + BS Neurological: Pt is alert. Not confused , motor grossly intact Skin: Skin is warm. No rash, no LE edema Psychiatric: Pt behavior is normal. No agitation.      Assessment & Plan:

## 2016-01-15 NOTE — Progress Notes (Signed)
Pre visit review using our clinic review tool, if applicable. No additional management support is needed unless otherwise documented below in the visit note. 

## 2016-01-15 NOTE — Patient Instructions (Signed)

## 2016-01-22 ENCOUNTER — Encounter (HOSPITAL_COMMUNITY): Payer: Self-pay | Admitting: Emergency Medicine

## 2016-01-22 ENCOUNTER — Emergency Department (HOSPITAL_COMMUNITY)
Admission: EM | Admit: 2016-01-22 | Discharge: 2016-01-22 | Disposition: A | Discharge status: Home / Self Care | Payer: Medicare Other | Attending: Emergency Medicine | Admitting: Emergency Medicine

## 2016-01-22 DIAGNOSIS — Y929 Unspecified place or not applicable: Secondary | ICD-10-CM | POA: Insufficient documentation

## 2016-01-22 DIAGNOSIS — Z794 Long term (current) use of insulin: Secondary | ICD-10-CM | POA: Insufficient documentation

## 2016-01-22 DIAGNOSIS — N182 Chronic kidney disease, stage 2 (mild): Secondary | ICD-10-CM | POA: Diagnosis not present

## 2016-01-22 DIAGNOSIS — Z8673 Personal history of transient ischemic attack (TIA), and cerebral infarction without residual deficits: Secondary | ICD-10-CM | POA: Diagnosis not present

## 2016-01-22 DIAGNOSIS — Y999 Unspecified external cause status: Secondary | ICD-10-CM | POA: Insufficient documentation

## 2016-01-22 DIAGNOSIS — G8929 Other chronic pain: Secondary | ICD-10-CM | POA: Diagnosis not present

## 2016-01-22 DIAGNOSIS — M62830 Muscle spasm of back: Secondary | ICD-10-CM

## 2016-01-22 DIAGNOSIS — M545 Low back pain, unspecified: Secondary | ICD-10-CM

## 2016-01-22 DIAGNOSIS — I2510 Atherosclerotic heart disease of native coronary artery without angina pectoris: Secondary | ICD-10-CM | POA: Diagnosis not present

## 2016-01-22 DIAGNOSIS — Y939 Activity, unspecified: Secondary | ICD-10-CM | POA: Insufficient documentation

## 2016-01-22 DIAGNOSIS — Z87891 Personal history of nicotine dependence: Secondary | ICD-10-CM | POA: Insufficient documentation

## 2016-01-22 DIAGNOSIS — Z7901 Long term (current) use of anticoagulants: Secondary | ICD-10-CM | POA: Insufficient documentation

## 2016-01-22 DIAGNOSIS — I5043 Acute on chronic combined systolic (congestive) and diastolic (congestive) heart failure: Secondary | ICD-10-CM | POA: Insufficient documentation

## 2016-01-22 DIAGNOSIS — E1122 Type 2 diabetes mellitus with diabetic chronic kidney disease: Secondary | ICD-10-CM | POA: Diagnosis not present

## 2016-01-22 DIAGNOSIS — I130 Hypertensive heart and chronic kidney disease with heart failure and stage 1 through stage 4 chronic kidney disease, or unspecified chronic kidney disease: Secondary | ICD-10-CM | POA: Insufficient documentation

## 2016-01-22 DIAGNOSIS — Z79899 Other long term (current) drug therapy: Secondary | ICD-10-CM | POA: Diagnosis not present

## 2016-01-22 DIAGNOSIS — X500XXA Overexertion from strenuous movement or load, initial encounter: Secondary | ICD-10-CM | POA: Insufficient documentation

## 2016-01-22 DIAGNOSIS — M549 Dorsalgia, unspecified: Secondary | ICD-10-CM

## 2016-01-22 MED ORDER — CYCLOBENZAPRINE HCL 10 MG PO TABS
10.0000 mg | ORAL_TABLET | Freq: Once | ORAL | Status: AC
  Administered 2016-01-22: 10 mg via ORAL
  Filled 2016-01-22: qty 1

## 2016-01-22 MED ORDER — CYCLOBENZAPRINE HCL 10 MG PO TABS: 10.0000 mg | ORAL_TABLET | Freq: Three times a day (TID) | ORAL | 0 refills | Status: DC | PRN

## 2016-01-22 NOTE — ED Provider Notes (Signed)
[place] DEPT Provider Note   CSN: [id number] Arrival date & time: 01/22/16  1604  By signing my name below, I, Higinio Plan, attest that this documentation has been prepared under the direction and in the presence of non-physician practitioner, [address], PA-C. Electronically Signed: Higinio Plan, Scribe. 01/22/2016. 7:03 PM.  History   Chief Complaint Chief Complaint  Patient presents with  . Back Pain   The history is provided by the patient and medical records. No language interpreter was used.  Back Pain   This is a chronic problem. The current episode started more than 2 days ago. The problem occurs constantly. The problem has not changed since onset.The pain is associated with lifting heavy objects. The pain is present in the lumbar spine. Quality: throbbing. The pain does not radiate. The pain is at a severity of 9/10. The pain is moderate. The symptoms are aggravated by bending and certain positions. The pain is the same all the time. Pertinent negatives include no chest pain, no fever, no numbness, no abdominal pain, no bowel incontinence, no perianal numbness, no bladder incontinence, no dysuria, no paresthesias, no paresis, no tingling and no weakness. Treatments tried: oxycodone. The treatment provided significant relief.   HPI Comments: [name] is a 60 y.o. male with PMHx of chronic back pain, CAD, CVA, HTN, and DM2 along with other medical conditions listed below, who presents to the Emergency Department complaining of acute on chronic lumbar back pain that began 3 days ago. Pt describes his pain as 9/10, constant, "throbbing," non-radiating pain; he notes it is exacerbated with certain movements and sitting for a long period of time. He states he helped his son move two large televisions recently, denies other injuries. He notes he has taken oxycodone (that he has prescribed for his knee pain) with moderate relief. He denies hx of CA/IVDU, fevers, chills, CP, SOB, abd  pain, N/V/D/C, hematuria, dysuria, incontinence of urine/stool, saddle anesthesia or cauda equina symptoms, myalgias, arthralgias, numbness, tingling, weakness, or any other symptoms. He has had similar issues like this in the past and used a muscle relaxant with significant improvement.   Past Medical History:  Diagnosis Date  . CAD (coronary artery disease)    a. Initial nonobst by cath 2009. b. Cath 01/2013 in setting of VT storm: obstructive distal Cx disease (small and terminates in the AV groove, unlikely to cause significant ischemia or electrical instability), nonobstructive RCA disease, EF 15-20%.   . Cerebrovascular accident [facility])    a. 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 Chronic systolic CHF (congestive heart failure) ([place])    a. Likely NICM (out of proportion to CAD). b. 2009 - EF 25-30% by echo, 01/2013: 15-20% by cath. c. 11/2014 Echo: EF 35-40%, Gr1 DD, mild MR, mod TR, PASP 65mmHg.  . CKD (chronic kidney disease), stage II   . Dyslipidemia   . Gout   . HTN (hypertension)   . Hypokalemia   . ICD (implantable cardiac defibrillator) in place   . Insulin dependent diabetes mellitus ([place])   . Lipoma   . Nonischemic cardiomyopathy ([place])    a.  11/2014 Echo: EF 35-40%, Gr1 DD, mild MR, mod TR, PASP 36mmHg.  . OSA (obstructive sleep apnea)    does not wear cpap  . PAF (paroxysmal atrial fibrillation) ([place])    a. Noted 05/2008 by EKG;  b. CHA2DS2VASc = 5-6-->coumadin.  . Paroxysmal VT ([place])    a. s/p St. Jude ICD 2007. b. H/o paroxysmal VT/VF including VT  storm 12/2012 admission prompting amio initiation;  c. 01/2013 ICD upgrade SJM 1411-36Q Ellipse VR single lead ICD.  [name] Pulmonary HTN ([place])    a. Mild by cath 01/2013.    Patient Active Problem List   Diagnosis Date Noted  . Morbid obesity ([place]) 12/05/2014  . Chronic systolic CHF (congestive heart failure) ([place])   . Type 2 diabetes mellitus ([place]) 11/21/2014  . OSA (obstructive sleep apnea) 11/21/2014  . Acute on chronic combined  systolic and diastolic ACC/AHA stage C congestive heart failure ([place])   . Severe sepsis with septic shock ([place]) 11/16/2014  . CAP (community acquired pneumonia) 11/15/2014  . Acute respiratory failure with hypoxia ([place]) 11/15/2014  . Elevated troponin 11/15/2014  . CKD (chronic kidney disease), stage II   . Encounter for therapeutic drug monitoring 06/23/2013  . PAF (paroxysmal atrial fibrillation) ([place]) 12/30/2012  . Acute on chronic systolic CHF (congestive heart failure) ([place]) 12/30/2012  . Nonischemic cardiomyopathy ([place])   . Ventricular tachycardia ([place]) 11/16/2012  . Muscle strain 09/17/2012  . Warfarin anticoagulation 03/18/2012  . Hearing loss, left 03/18/2012  . Dizziness 12/22/2011  . Edema 09/08/2011  . Obesity 01/16/2011  . Preventative health care 09/04/2010  . Long term (current) use of anticoagulants 08/05/2010  . Automatic implantable cardioverter-defibrillator in situ 08/15/2009  . FATIGUE 06/17/2009  . CAD 10/11/2008  . Paroxysmal ventricular tachycardia ([place]) 08/28/2008  . Atrial fibrillation ([place]) 08/28/2008  . Obstructive sleep apnea 06/11/2008  . History of cardiovascular disorder 12/06/2007  . HYPOKALEMIA 05/09/2007  . Hypertensive heart disease 05/09/2007  . LIPOMA 05/06/2007  . Hyperlipidemia 05/06/2007  . Gout 05/06/2007  . Chronic combined systolic and diastolic CHF (congestive heart failure) ([place]) 05/06/2007  . ACUTE BUT ILL-DEFINED CEREBROVASCULAR DISEASE 05/06/2007  . DENTAL CARIES 05/06/2007    Past Surgical History:  Procedure Laterality Date  . CARDIAC CATHETERIZATION     Nonobstructive coronary disease 2009  . CARDIAC DEFIBRILLATOR PLACEMENT     ICD-St. Jude  . IMPLANTABLE CARDIOVERTER DEFIBRILLATOR (ICD) GENERATOR CHANGE N/A 01/15/2014   Procedure: ICD GENERATOR CHANGE;  Surgeon: Evans Lance, MD;  Location: [facility] CATH LAB;  Service: Cardiovascular;  Laterality: N/A;  . LEFT AND RIGHT HEART CATHETERIZATION WITH CORONARY ANGIOGRAM N/A  01/03/2013   Procedure: LEFT AND RIGHT HEART CATHETERIZATION WITH CORONARY ANGIOGRAM;  Surgeon: Peter M Martinique, MD;  Location: [facility] CATH LAB;  Service: Cardiovascular;  Laterality: N/A;  . LIPOMA EXCISION       Home Medications    Prior to Admission medications   Medication Sig Start Date End Date Taking? Authorizing Provider  ACCU-CHEK SOFTCLIX LANCETS lancets Use to help check blood sugar once a day Dx e11.9 08/20/15   Biagio Borg, MD  allopurinol (ZYLOPRIM) 300 MG tablet TAKE 1 TABLET (300 MG TOTAL) BY MOUTH DAILY. 07/17/15   Biagio Borg, MD  amiodarone (PACERONE) 200 MG tablet TAKE 1 TABLET (200 MG TOTAL) BY MOUTH DAILY. 07/17/15   Biagio Borg, MD  amLODipine (NORVASC) 10 MG tablet TAKE 1 TABLET (10 MG TOTAL) BY MOUTH DAILY. 11/21/15   Biagio Borg, MD  atorvastatin (LIPITOR) 80 MG tablet Take 1 tablet (80 mg total) by mouth at bedtime. 07/17/15   Biagio Borg, MD  carvedilol (COREG) 25 MG tablet TAKE 2 TABLETS (50 MG TOTAL) BY MOUTH 2 (TWO) TIMES DAILY WITH A MEAL. 07/17/15   Biagio Borg, MD  furosemide (LASIX) 80 MG tablet TAKE 1 TABLET (80 MG TOTAL) BY MOUTH DAILY. 07/17/15   Jeneen Rinks  Quin Hoop, MD  glucose blood (ACCU-CHEK SMARTVIEW) test strip 1 each by Other route daily. Use to check blood sugar once a day Dx e11.9 08/20/15   Biagio Borg, MD  hydrALAZINE (APRESOLINE) 50 MG tablet TAKE 1 TABLET (50 MG TOTAL) BY MOUTH EVERY 8 (EIGHT) HOURS. 09/16/15   Minus Breeding, MD  isosorbide dinitrate (ISORDIL) 20 MG tablet TAKE 1 TABLET (20 MG TOTAL) BY MOUTH 3 (THREE) TIMES DAILY. 01/01/16   Minus Breeding, MD  LANTUS SOLOSTAR 100 UNIT/ML Solostar Pen INJECT 10 UNITS INTO THE SKIN AT BEDTIME. 05/28/15   Biagio Borg, MD  Multiple Vitamin (MULTIVITAMIN) capsule Take 1 capsule by mouth daily.     Historical Provider, MD  nitroGLYCERIN (NITROSTAT) 0.4 MG SL tablet Place 1 tablet (0.4 mg total) under the tongue every 5 (five) minutes x 3 doses as needed for chest pain. 07/17/15   Biagio Borg, MD  potassium  chloride SA (KLOR-CON M20) 20 MEQ tablet TAKE 2 TABLETS (40 MEQ TOTAL) BY MOUTH DAILY. 07/17/15   Biagio Borg, MD  spironolactone (ALDACTONE) 25 MG tablet TAKE 1 TABLET (25 MG TOTAL) BY MOUTH DAILY. 10/02/15   Biagio Borg, MD  warfarin (COUMADIN) 5 MG tablet TAKE AS DIRECTED BY ANTICOAGULATION CLINIC 12/06/15   Biagio Borg, MD    Family History Family History  Problem Relation Age of Onset  . Coronary artery disease Mother   . Heart attack Mother   . Heart attack Maternal Uncle   . Heart attack Maternal Grandmother     Social History Social History  Substance Use Topics  . Smoking status: Former Smoker    Quit date: 05/04/1986  . Smokeless tobacco: Never Used     Comment: Quit smoking 30 yrs ago. Smoked as teenager less than 1/2 ppd. Smoked x 4 years.  . Alcohol use No     Comment: occasionally     Allergies   Review of patient's allergies indicates no known allergies.   Review of Systems Review of Systems  Constitutional: Negative for chills and fever.  Respiratory: Negative for shortness of breath.   Cardiovascular: Negative for chest pain.  Gastrointestinal: Negative for abdominal pain, bowel incontinence, constipation, diarrhea, nausea and vomiting.  Genitourinary: Negative for bladder incontinence, difficulty urinating (no incontinence), dysuria and hematuria.  Musculoskeletal: Positive for back pain. Negative for arthralgias and myalgias.  Skin: Negative for color change.  Allergic/Immunologic: Positive for immunocompromised state (diabetic).  Neurological: Negative for tingling, weakness, numbness and paresthesias.  Hematological: Bruises/bleeds easily (on coumadin).  Psychiatric/Behavioral: Negative for confusion.  10 systems reviewed and all are negative for acute change except as noted in the HPI.  Physical Exam Updated Vital Signs BP 113/83 (BP Location: Right Arm)   Pulse 67   Temp 97.8 F (36.6 C) (Oral)   Resp 18   SpO2 97%   Physical Exam    Constitutional: He is oriented to person, place, and time. Vital signs are normal. He appears well-developed and well-nourished.  Non-toxic appearance. No distress.  Afebrile, nontoxic, NAD  HENT:  Head: Normocephalic and atraumatic.  Mouth/Throat: Mucous membranes are normal.  Eyes: Conjunctivae and EOM are normal. Right eye exhibits no discharge. Left eye exhibits no discharge.  Neck: Normal range of motion. Neck supple.  Cardiovascular: Normal rate and intact distal pulses.   Pulmonary/Chest: Effort normal. No respiratory distress.  Abdominal: Normal appearance. He exhibits no distension.  Musculoskeletal: Normal range of motion.       Lumbar back: He exhibits  tenderness and spasm. He exhibits normal range of motion, no bony tenderness and no deformity.  Lumbar spine with FROM intact without spinous process TTP, no bony stepoffs or deformities, with mild bilateral paraspinous muscle TTP and muscle spasms. Strength and sensation grossly intact in all extremities, negative SLR bilaterally, gait steady and nonantalgic. No overlying skin changes. Distal pulses intact.  Neurological: He is alert and oriented to person, place, and time. He has normal strength. No sensory deficit.  Skin: Skin is warm, dry and intact. No rash noted.  Psychiatric: He has a normal mood and affect.  Nursing note and vitals reviewed.  ED Treatments / Results  Labs (all labs ordered are listed, but only abnormal results are displayed) Labs Reviewed - No data to display  EKG  EKG Interpretation None       Radiology No results found.  Procedures Procedures (including critical care time)  Medications Ordered in ED Medications  cyclobenzaprine (FLEXERIL) tablet 10 mg (10 mg Oral Given 01/22/16 1853)    DIAGNOSTIC STUDIES:  Oxygen Saturation is 97% on RA, normal by my interpretation.    COORDINATION OF CARE:  6:39 PM Discussed treatment plan with pt at bedside and pt agreed to plan.  Initial  Impression / Assessment and Plan / ED Course  I have reviewed the triage vital signs and the nursing notes.  Pertinent labs & imaging results that were available during my care of the patient were reviewed by me and considered in my medical decision making (see chart for details).  Clinical Course    60 y.o. male here with acute on chronic low back pain, no specific injury but has done some heavy lifting recently. Tenderness to b/l paraspinous muscles in lumbar region, no focal bony TTP. No red flag s/s of low back pain. No s/s of central cord compression or cauda equina. Lower extremities are neurovascularly intact and patient is ambulating without difficulty. Doubt need for imaging or labs at this time.   Patient was counseled on back pain precautions and told to do activity as tolerated but do not lift, push, or pull heavy objects more than 10 pounds for the next week. Patient counseled to use ice or heat on back for no longer than 15 minutes every hour.   Rx given for muscle relaxer and counseled on proper use of muscle relaxant medication. He has rx for narcotic pain medicine, discussed use of these and counseled on proper use of narcotic pain medications. Told that they can increase to every 4 hrs if needed while pain is worse. Counseled not to combine this medication with others containing tylenol. Urged patient not to drink alcohol, drive, or perform any other activities that requires focus while taking either of these medications. Discussed use of ibuprofen in addition.  Patient urged to follow-up with PCP if pain does not improve with treatment and rest or if pain becomes recurrent. Urged to return with worsening severe pain, loss of bowel or bladder control, trouble walking. The patient verbalizes understanding and agrees with the plan.    I personally performed the services described in this documentation, which was scribed in my presence. The recorded information has been reviewed and is  accurate.   Final Clinical Impressions(s) / ED Diagnoses   Final diagnoses:  Bilateral low back pain without sciatica  Muscle spasm of back  Chronic back pain    New Prescriptions New Prescriptions   CYCLOBENZAPRINE (FLEXERIL) 10 MG TABLET    Take 1 tablet (10 mg total)  by mouth 3 (three) times daily as needed for muscle spasms.     Tasheem Elms Camprubi-Soms, PA-C 01/22/16 1912    Varney Biles, MD 01/23/16 0111

## 2016-01-22 NOTE — Discharge Instructions (Signed)
Back Pain: Your back pain should be treated with medicines such as ibuprofen or aleve and this back pain should get better over the next 2 weeks.  However if you develop severe or worsening pain, low back pain with fever, numbness, weakness or inability to walk or urinate, you should return to the ER immediately.  Please follow up with your doctor this week for a recheck if still having symptoms.  Avoid heavy lifting over 10 pounds over the next two weeks.  Low back pain is discomfort in the lower back that may be due to injuries to muscles and ligaments around the spine.  Occasionally, it may be caused by a a problem to a part of the spine called a disc.  The pain may last several days or a week;  However, most patients get completely well in 4 weeks.  Self - care:  The application of heat can help soothe the pain.  Maintaining your daily activities, including walking, is encourged, as it will help you get better faster than just staying in bed. Perform gentle stretching as discussed. Drink plenty of fluids.  Medications are also useful to help with pain control.  A commonly prescribed medication includes your home oxycodone.  Do not drive or operate heavy machinery while taking this medication.  Non steroidal anti inflammatory medications including Ibuprofen and naproxen;  use ibuprofen 200mg  twice daily as needed for additional pain relief. These medications help both pain and swelling and are very useful in treating back pain.  They should be taken with food, as they can cause stomach upset, and more seriously, stomach bleeding.    Muscle relaxants:  These medications can help with muscle tightness that is a cause of lower back pain.  Most of these medications can cause drowsiness, and it is not safe to drive or use dangerous machinery while taking them.  SEEK IMMEDIATE MEDICAL ATTENTION IF: New numbness, tingling, weakness, or problem with the use of your arms or legs.  Severe back pain not  relieved with medications.  Difficulty with or loss of control of your bowel or bladder control.  Increasing pain in any areas of the body (such as chest or abdominal pain).  Shortness of breath, dizziness or fainting.  Nausea (feeling sick to your stomach), vomiting, fever, or sweats.  You will need to follow up with  Your primary healthcare provider in 1-2 weeks for reassessment.

## 2016-01-22 NOTE — ED Triage Notes (Signed)
Pt here for lower back pain x 3 days worse with movement

## 2016-02-18 ENCOUNTER — Other Ambulatory Visit: Payer: Self-pay | Admitting: Internal Medicine

## 2016-02-19 ENCOUNTER — Ambulatory Visit (INDEPENDENT_AMBULATORY_CARE_PROVIDER_SITE_OTHER): Payer: Medicare Other | Admitting: General Practice

## 2016-02-19 DIAGNOSIS — I4891 Unspecified atrial fibrillation: Secondary | ICD-10-CM

## 2016-02-19 DIAGNOSIS — Z5181 Encounter for therapeutic drug level monitoring: Secondary | ICD-10-CM

## 2016-02-19 DIAGNOSIS — Z8679 Personal history of other diseases of the circulatory system: Secondary | ICD-10-CM

## 2016-02-19 LAB — POCT INR: INR: 2.7

## 2016-02-19 NOTE — Progress Notes (Signed)
I have reviewed and agree with the plan. 

## 2016-02-19 NOTE — Patient Instructions (Signed)
Pre visit review using our clinic review tool, if applicable. No additional management support is needed unless otherwise documented below in the visit note. 

## 2016-02-22 ENCOUNTER — Other Ambulatory Visit: Payer: Self-pay | Admitting: Internal Medicine

## 2016-02-27 ENCOUNTER — Ambulatory Visit (INDEPENDENT_AMBULATORY_CARE_PROVIDER_SITE_OTHER): Payer: Medicare Other | Admitting: *Deleted

## 2016-02-27 DIAGNOSIS — I428 Other cardiomyopathies: Secondary | ICD-10-CM

## 2016-02-27 DIAGNOSIS — I5022 Chronic systolic (congestive) heart failure: Secondary | ICD-10-CM

## 2016-02-27 NOTE — Progress Notes (Signed)
Remote ICD transmission.   

## 2016-03-06 ENCOUNTER — Encounter: Payer: Self-pay | Admitting: Cardiology

## 2016-03-08 ENCOUNTER — Other Ambulatory Visit: Payer: Self-pay | Admitting: Internal Medicine

## 2016-03-09 ENCOUNTER — Telehealth: Payer: Self-pay | Admitting: Emergency Medicine

## 2016-03-09 ENCOUNTER — Other Ambulatory Visit: Payer: Self-pay | Admitting: Internal Medicine

## 2016-03-09 NOTE — Telephone Encounter (Signed)
Called patient unable to reach left message to gice [place] a call back

## 2016-03-09 NOTE — Telephone Encounter (Signed)
Pt called and asked that you give him a call back. He has some questions about his medication and referrals. Please advise thanks.

## 2016-03-11 ENCOUNTER — Other Ambulatory Visit: Payer: Self-pay | Admitting: *Deleted

## 2016-03-11 MED ORDER — AMIODARONE HCL 200 MG PO TABS: ORAL_TABLET | ORAL | 0 refills | Status: DC

## 2016-03-11 MED ORDER — ATORVASTATIN CALCIUM 80 MG PO TABS: 80.0000 mg | ORAL_TABLET | Freq: Every day | ORAL | 0 refills | Status: DC

## 2016-03-11 MED ORDER — ALLOPURINOL 300 MG PO TABS: ORAL_TABLET | ORAL | 0 refills | Status: DC

## 2016-03-11 MED ORDER — ISOSORBIDE DINITRATE 20 MG PO TABS: ORAL_TABLET | ORAL | 0 refills | Status: DC

## 2016-03-11 MED ORDER — AMLODIPINE BESYLATE 10 MG PO TABS: ORAL_TABLET | ORAL | 0 refills | Status: DC

## 2016-03-11 MED ORDER — POTASSIUM CHLORIDE CRYS ER 20 MEQ PO TBCR: EXTENDED_RELEASE_TABLET | ORAL | 0 refills | Status: DC

## 2016-03-11 MED ORDER — SPIRONOLACTONE 25 MG PO TABS: ORAL_TABLET | ORAL | 0 refills | Status: DC

## 2016-03-11 MED ORDER — FUROSEMIDE 80 MG PO TABS: ORAL_TABLET | ORAL | 0 refills | Status: DC

## 2016-03-11 MED ORDER — CARVEDILOL 25 MG PO TABS: ORAL_TABLET | ORAL | 0 refills | Status: DC

## 2016-03-12 ENCOUNTER — Telehealth: Payer: Self-pay

## 2016-03-12 MED ORDER — HYDRALAZINE HCL 50 MG PO TABS: ORAL_TABLET | ORAL | 1 refills | Status: DC

## 2016-03-12 NOTE — Telephone Encounter (Signed)
Medication refill sent to pharmacy  

## 2016-03-16 ENCOUNTER — Other Ambulatory Visit: Payer: Self-pay | Admitting: General Practice

## 2016-03-16 ENCOUNTER — Telehealth: Payer: Self-pay

## 2016-03-16 MED ORDER — WARFARIN SODIUM 5 MG PO TABS: ORAL_TABLET | ORAL | 1 refills | Status: DC

## 2016-03-16 NOTE — Telephone Encounter (Signed)
Please advise patient is requesting refill on coumadin.

## 2016-03-20 ENCOUNTER — Encounter: Payer: Self-pay | Admitting: Cardiology

## 2016-03-25 ENCOUNTER — Encounter: Payer: Self-pay | Admitting: *Deleted

## 2016-03-26 LAB — CUP PACEART REMOTE DEVICE CHECK
Battery Remaining Longevity: 83 mo
Battery Remaining Percentage: 81 %
Battery Voltage: 2.99 V
Brady Statistic RV Percent Paced: 1 %
HIGH POWER IMPEDANCE MEASURED VALUE: 46 Ohm
HIGH POWER IMPEDANCE MEASURED VALUE: 46 Ohm
Implantable Lead Implant Date: 20070817
Implantable Lead Model: 7001
Implantable Pulse Generator Implant Date: 20150914
Lead Channel Impedance Value: 410 Ohm
Lead Channel Pacing Threshold Amplitude: 1.25 V
Lead Channel Pacing Threshold Pulse Width: 0.7 ms
Lead Channel Sensing Intrinsic Amplitude: 12 mV
Lead Channel Setting Sensing Sensitivity: 0.5 mV
MDC IDC LEAD LOCATION: 753860
MDC IDC PG SERIAL: 7214340
MDC IDC SESS DTM: 20171026060017
MDC IDC SET LEADCHNL RV PACING AMPLITUDE: 2.5 V
MDC IDC SET LEADCHNL RV PACING PULSEWIDTH: 0.7 ms

## 2016-03-29 NOTE — Progress Notes (Signed)
HPI The patient presents for follow up of CHF.  He was hospitalized with pneumonia and sepsis in July 2016. There was a mild troponin elevation. He had some renal insufficiency. His EF was stable at 35-40% during that hospitalization.  Since I last saw him he has done well.  He is going to join [employer].  The patient denies any new symptoms such as chest discomfort, neck or arm discomfort. There has been no new shortness of breath, PND or orthopnea. There have been no reported palpitations, presyncope or syncope.  No Known Allergies  Current Outpatient Prescriptions  Medication Sig Dispense Refill  . ACCU-CHEK SOFTCLIX LANCETS lancets Use to help check blood sugar once a day Dx e11.9 100 each 3  . allopurinol (ZYLOPRIM) 300 MG tablet TAKE 1 TABLET (300 MG TOTAL) BY MOUTH DAILY. 90 tablet 0  . amiodarone (PACERONE) 200 MG tablet TAKE 1 TABLET (200 MG TOTAL) BY MOUTH DAILY. 90 tablet 0  . amLODipine (NORVASC) 10 MG tablet TAKE 1 TABLET (10 MG TOTAL) BY MOUTH DAILY. 90 tablet 0  . atorvastatin (LIPITOR) 80 MG tablet Take 1 tablet (80 mg total) by mouth at bedtime. 90 tablet 0  . carvedilol (COREG) 25 MG tablet TAKE 2 TABLETS (50 MG TOTAL) BY MOUTH 2 (TWO) TIMES DAILY WITH A MEAL. 360 tablet 0  . cyclobenzaprine (FLEXERIL) 10 MG tablet Take 1 tablet (10 mg total) by mouth 3 (three) times daily as needed for muscle spasms. 15 tablet 0  . furosemide (LASIX) 80 MG tablet TAKE 1 TABLET (80 MG TOTAL) BY MOUTH DAILY. 90 tablet 0  . glucose blood (ACCU-CHEK SMARTVIEW) test strip 1 each by Other route daily. Use to check blood sugar once a day Dx e11.9 100 each 3  . hydrALAZINE (APRESOLINE) 50 MG tablet TAKE 1 TABLET (50 MG TOTAL) BY MOUTH EVERY 8 (EIGHT) HOURS. 270 tablet 1  . isosorbide dinitrate (ISORDIL) 20 MG tablet TAKE 1 TABLET (20 MG TOTAL) BY MOUTH 3 (THREE) TIMES DAILY. 270 tablet 0  . LANTUS SOLOSTAR 100 UNIT/ML Solostar Pen INJECT 10 UNITS INTO THE SKIN AT BEDTIME. 15 mL 2  .  Multiple Vitamin (MULTIVITAMIN) capsule Take 1 capsule by mouth daily.     . nitroGLYCERIN (NITROSTAT) 0.4 MG SL tablet PLACE 1 TABLET UNDER TONGUE EVERY 5 MINS X3 DOSES AS NEEDED FOR CHEST PAIN 25 tablet 1  . potassium chloride SA (KLOR-CON M20) 20 MEQ tablet TAKE 2 TABLETS (40 MEQ TOTAL) BY MOUTH DAILY. 180 tablet 0  . spironolactone (ALDACTONE) 25 MG tablet TAKE 1 TABLET (25 MG TOTAL) BY MOUTH DAILY. 90 tablet 0  . warfarin (COUMADIN) 5 MG tablet TAKE AS DIRECTED BY ANTICOAGULATION CLINIC 180 tablet 1   No current facility-administered medications for this visit.     Past Medical History:  Diagnosis Date  . CAD (coronary artery disease)    a. Initial nonobst by cath 2009. b. Cath 01/2013 in setting of VT storm: obstructive distal Cx disease (small and terminates in the AV groove, unlikely to cause significant ischemia or electrical instability), nonobstructive RCA disease, EF 15-20%.   . Cerebrovascular accident [facility])    a. 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 Chronic systolic CHF (congestive heart failure) ([place])    a. Likely NICM (out of proportion to CAD). b. 2009 - EF 25-30% by echo, 01/2013: 15-20% by cath. c. 11/2014 Echo: EF 35-40%, Gr1 DD, mild MR, mod TR, PASP 16mmHg.  . CKD (chronic kidney disease), stage II   .  Dyslipidemia   . Gout   . HTN (hypertension)   . Hypokalemia   . ICD (implantable cardiac defibrillator) in place   . Insulin dependent diabetes mellitus ([place])   . Lipoma   . Nonischemic cardiomyopathy ([place])    a.  11/2014 Echo: EF 35-40%, Gr1 DD, mild MR, mod TR, PASP 55mmHg.  . OSA (obstructive sleep apnea)    does not wear cpap  . PAF (paroxysmal atrial fibrillation) ([place])    a. Noted 05/2008 by EKG;  b. CHA2DS2VASc = 5-6-->coumadin.  . Paroxysmal VT ([place])    a. s/p St. Jude ICD 2007. b. H/o paroxysmal VT/VF including VT storm 12/2012 admission prompting amio initiation;  c. 01/2013 ICD upgrade SJM 1411-36Q Ellipse VR single lead ICD.  [name] Pulmonary HTN    a. Mild by  cath 01/2013.    Past Surgical History:  Procedure Laterality Date  . CARDIAC CATHETERIZATION     Nonobstructive coronary disease 2009  . CARDIAC DEFIBRILLATOR PLACEMENT     ICD-St. Jude  . IMPLANTABLE CARDIOVERTER DEFIBRILLATOR (ICD) GENERATOR CHANGE N/A 01/15/2014   Procedure: ICD GENERATOR CHANGE;  Surgeon: Evans Lance, MD;  Location[facility] LAB;  Service: Cardiovascular;  Laterality: N/A;  . LEFT AND RIGHT HEART CATHETERIZATION WITH CORONARY ANGIOGRAM N/A 01/03/2013   Procedure: LEFT AND RIGHT HEART CATHETERIZATION WITH CORONARY ANGIOGRAM;  Surgeon: Peter M Martinique, MD;  Location: [facility] CATH LAB;  Service: Cardiovascular;  Laterality: N/A;  . LIPOMA EXCISION     ROS:   As stated in the HPI and negative for all other systems.  PHYSICAL EXAM BP 104/70   Pulse 73   Ht 6' (1.829 m)   Wt 283 lb 9.6 oz (128.6 kg)   BMI 38.46 kg/m  GENERAL:  Well appearing NECK:  No jugular venous distention, waveform within normal limits, carotid upstroke brisk and symmetric, no bruits, no thyromegaly LUNGS:  Clear to auscultation bilaterally BACK:  No CVA tenderness CHEST:  Unremarkable, well-healed ICD pocket HEART:  PMI not displaced or sustained,S1 and S2 within normal limits, no S3, no S4, no clicks, no rubs, no murmurs ABD:  Flat, positive bowel sounds normal in frequency in pitch, no bruits, no rebound, no guarding, no midline pulsatile mass, no hepatomegaly, no splenomegaly EXT:  2 plus pulses throughout, no edema, no cyanosis no clubbing  EKG:   Sinus rhythm, rate 73, this lead were, high lateral probable infarct old, oriented R-wave progression, premature ventricular contraction.  ASSESSMENT AND PLAN  Congestive heart failure, systolic-  His new baseline weight at home has been about  269. He was 263 in the past.  He was 278 when I saw him most recently.  I think this is his new dry weight.  I am going to continue current meds.    HYPERTENSION -  His blood pressures being treated in the  context of treating his congestive heart failure.  Atrial fibrillation-  He has had no symptomatic paroxysms of this. He's not interested in changing from warfarin to another agent.  He is up to date with labs as above.  I reviewed the device check and there has been no atrial fib.   Lab Results  Component Value Date   TSH 0.95 07/17/2015   ALT 22 01/15/2016   AST 22 01/15/2016   ALKPHOS 71 01/15/2016   BILITOT 1.3 (H) 01/15/2016   PROT 7.5 01/15/2016   ALBUMIN 3.9 01/15/2016   Lab Results  Component Value Date   HGBA1C 5.5 01/15/2016  CKD II - This is stable.  No change in therapy is indicated.   His recent creat was stable as below.  Lab Results  Component Value Date   CREATININE 1.45 01/15/2016      HYPERLIPIDEMIA - His LDL was recently 103.  He will continue with meds as listed.   Lab Results  Component Value Date   CHOL 151 01/15/2016   TRIG 59.0 01/15/2016   HDL 36.20 (L) 01/15/2016   LDLCALC 103 (H) 01/15/2016   LDLDIRECT 194.6 05/09/2007    ICD - He is up to date with EP follow up.   He sees Dr. Lovena Le soon.   OBESITY - The patient understands the need to lose weight with diet and exercise.   He is going to join [employer].  I suggested light weight training with 10 lb dumbbells, light resistance training there Therabands and light to moderate aerobic activity such as a treadmill or bike.

## 2016-03-31 ENCOUNTER — Ambulatory Visit (INDEPENDENT_AMBULATORY_CARE_PROVIDER_SITE_OTHER): Payer: Medicare Other | Admitting: Pharmacist Clinician (PhC)/ Clinical Pharmacy Specialist

## 2016-03-31 ENCOUNTER — Encounter: Payer: Self-pay | Admitting: Cardiology

## 2016-03-31 ENCOUNTER — Ambulatory Visit (INDEPENDENT_AMBULATORY_CARE_PROVIDER_SITE_OTHER): Payer: Medicare Other | Admitting: Cardiology

## 2016-03-31 VITALS — BP 104/70 | HR 73 | Ht 72.0 in | Wt 283.6 lb

## 2016-03-31 DIAGNOSIS — I4891 Unspecified atrial fibrillation: Secondary | ICD-10-CM

## 2016-03-31 DIAGNOSIS — I428 Other cardiomyopathies: Secondary | ICD-10-CM | POA: Diagnosis not present

## 2016-03-31 DIAGNOSIS — I5022 Chronic systolic (congestive) heart failure: Secondary | ICD-10-CM

## 2016-03-31 DIAGNOSIS — I480 Paroxysmal atrial fibrillation: Secondary | ICD-10-CM

## 2016-03-31 DIAGNOSIS — Z5181 Encounter for therapeutic drug level monitoring: Secondary | ICD-10-CM

## 2016-03-31 DIAGNOSIS — Z95810 Presence of automatic (implantable) cardiac defibrillator: Secondary | ICD-10-CM

## 2016-03-31 DIAGNOSIS — I110 Hypertensive heart disease with heart failure: Secondary | ICD-10-CM

## 2016-03-31 DIAGNOSIS — Z8679 Personal history of other diseases of the circulatory system: Secondary | ICD-10-CM

## 2016-03-31 LAB — POCT INR: INR: 3.4

## 2016-03-31 NOTE — Patient Instructions (Addendum)
Medication Instructions:  Continue current medications  Labwork: None Ordered  Testing/Procedures: None Ordered  Follow-Up: Your physician wants you to follow-up in: 6 Months. You will receive a reminder letter in the mail two months in advance. If you don't receive a letter, please call our office to schedule the follow-up appointment.   Any Other Special Instructions Will Be Listed Below (If Applicable).  The patient understands the need to lose weight with diet and exercise.   He is going to join [employer].  I suggested light weight training with 10 lb dumbbells, light resistance training there Therabands and light to moderate aerobic activity such as a treadmill or bike.    If you need a refill on your cardiac medications before your next appointment, please call your pharmacy.

## 2016-04-01 ENCOUNTER — Ambulatory Visit: Payer: Medicare Other

## 2016-04-30 ENCOUNTER — Other Ambulatory Visit: Payer: Self-pay | Admitting: Internal Medicine

## 2016-05-08 ENCOUNTER — Ambulatory Visit (INDEPENDENT_AMBULATORY_CARE_PROVIDER_SITE_OTHER): Payer: Medicare Other | Admitting: General Practice

## 2016-05-08 DIAGNOSIS — Z5181 Encounter for therapeutic drug level monitoring: Secondary | ICD-10-CM

## 2016-05-08 DIAGNOSIS — Z8679 Personal history of other diseases of the circulatory system: Secondary | ICD-10-CM

## 2016-05-08 DIAGNOSIS — I4891 Unspecified atrial fibrillation: Secondary | ICD-10-CM

## 2016-05-08 LAB — POCT INR: INR: 2.5

## 2016-05-08 NOTE — Progress Notes (Signed)
I have reviewed and agree with the plan. 

## 2016-05-08 NOTE — Patient Instructions (Signed)
Pre visit review using our clinic review tool, if applicable. No additional management support is needed unless otherwise documented below in the visit note. 

## 2016-05-12 ENCOUNTER — Telehealth: Payer: Self-pay

## 2016-05-12 ENCOUNTER — Encounter: Payer: Self-pay | Admitting: Internal Medicine

## 2016-05-12 ENCOUNTER — Ambulatory Visit (INDEPENDENT_AMBULATORY_CARE_PROVIDER_SITE_OTHER): Payer: Medicare Other | Admitting: Internal Medicine

## 2016-05-12 ENCOUNTER — Other Ambulatory Visit: Payer: Self-pay | Admitting: General Practice

## 2016-05-12 VITALS — BP 130/86 | HR 84 | Ht 72.0 in | Wt 285.4 lb

## 2016-05-12 DIAGNOSIS — Z95810 Presence of automatic (implantable) cardiac defibrillator: Secondary | ICD-10-CM

## 2016-05-12 DIAGNOSIS — I5022 Chronic systolic (congestive) heart failure: Secondary | ICD-10-CM

## 2016-05-12 MED ORDER — WARFARIN SODIUM 5 MG PO TABS: ORAL_TABLET | ORAL | 1 refills | Status: DC

## 2016-05-12 NOTE — Telephone Encounter (Signed)
Please advise patient is requesting refill on warafin

## 2016-05-12 NOTE — Progress Notes (Signed)
HPI [name] returns today for followup. He is a pleasant 61 yo man with a h/o non-ischemic CM, chronic systolic heart failure and VT.  The patient denies chest pain or worsening shortness of breath or peripheral edema.  He has had no recurrent hospitalizations. He has very mild peripheral edema. He is trying to maintain a low-sodium diet. He has struggled with his weight.   No Known Allergies   Current Outpatient Prescriptions  Medication Sig Dispense Refill  . ACCU-CHEK SOFTCLIX LANCETS lancets Use to help check blood sugar once a day Dx e11.9 100 each 3  . allopurinol (ZYLOPRIM) 300 MG tablet TAKE 1 TABLET (300 MG TOTAL) BY MOUTH DAILY. 90 tablet 0  . amiodarone (PACERONE) 200 MG tablet TAKE 1 TABLET (200 MG TOTAL) BY MOUTH DAILY. 90 tablet 0  . amLODipine (NORVASC) 10 MG tablet TAKE 1 TABLET (10 MG TOTAL) BY MOUTH DAILY. 90 tablet 0  . atorvastatin (LIPITOR) 80 MG tablet Take 1 tablet (80 mg total) by mouth at bedtime. 90 tablet 0  . B-D ULTRAFINE III SHORT PEN 31G X 8 MM MISC USE ONCE DAILY WITH INSULIN. DIAGNOSIS 100 each 1  . carvedilol (COREG) 25 MG tablet TAKE 2 TABLETS BY MOUTH 2  TIMES DAILY WITH A MEAL. 360 tablet 1  . cyclobenzaprine (FLEXERIL) 10 MG tablet Take 1 tablet (10 mg total) by mouth 3 (three) times daily as needed for muscle spasms. 15 tablet 0  . furosemide (LASIX) 80 MG tablet TAKE 1 TABLET (80 MG TOTAL) BY MOUTH DAILY. 90 tablet 0  . glucose blood (ACCU-CHEK SMARTVIEW) test strip 1 each by Other route daily. Use to check blood sugar once a day Dx e11.9 100 each 3  . hydrALAZINE (APRESOLINE) 50 MG tablet TAKE 1 TABLET (50 MG TOTAL) BY MOUTH EVERY 8 (EIGHT) HOURS. 270 tablet 1  . isosorbide dinitrate (ISORDIL) 20 MG tablet TAKE 1 TABLET (20 MG TOTAL) BY MOUTH 3 (THREE) TIMES DAILY. 270 tablet 0  . LANTUS SOLOSTAR 100 UNIT/ML Solostar Pen INJECT 10 UNITS INTO THE SKIN AT BEDTIME. 15 mL 2  . Multiple Vitamin (MULTIVITAMIN) capsule Take 1 capsule by mouth daily.     .  nitroGLYCERIN (NITROSTAT) 0.4 MG SL tablet PLACE 1 TABLET UNDER TONGUE EVERY 5 MINS X3 DOSES AS NEEDED FOR CHEST PAIN 25 tablet 1  . potassium chloride SA (KLOR-CON M20) 20 MEQ tablet TAKE 2 TABLETS (40 MEQ TOTAL) BY MOUTH DAILY. 180 tablet 0  . spironolactone (ALDACTONE) 25 MG tablet TAKE 1 TABLET (25 MG TOTAL) BY MOUTH DAILY. 90 tablet 0  . warfarin (COUMADIN) 5 MG tablet TAKE AS DIRECTED BY ANTICOAGULATION CLINIC 180 tablet 1   No current facility-administered medications for this visit.      Past Medical History:  Diagnosis Date  . CAD (coronary artery disease)    a. Initial nonobst by cath 2009. b. Cath 01/2013 in setting of VT storm: obstructive distal Cx disease (small and terminates in the AV groove, unlikely to cause significant ischemia or electrical instability), nonobstructive RCA disease, EF 15-20%.   . Cerebrovascular accident [facility])    a. 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 Chronic systolic CHF (congestive heart failure) ([place])    a. Likely NICM (out of proportion to CAD). b. 2009 - EF 25-30% by echo, 01/2013: 15-20% by cath. c. 11/2014 Echo: EF 35-40%, Gr1 DD, mild MR, mod TR, PASP 74mmHg.  . CKD (chronic kidney disease), stage II   . Dyslipidemia   . Gout   .  HTN (hypertension)   . Hypokalemia   . ICD (implantable cardiac defibrillator) in place   . Insulin dependent diabetes mellitus ([place])   . Lipoma   . Nonischemic cardiomyopathy ([place])    a.  11/2014 Echo: EF 35-40%, Gr1 DD, mild MR, mod TR, PASP 7mmHg.  . OSA (obstructive sleep apnea)    does not wear cpap  . PAF (paroxysmal atrial fibrillation) ([place])    a. Noted 05/2008 by EKG;  b. CHA2DS2VASc = 5-6-->coumadin.  . Paroxysmal VT ([place])    a. s/p St. Jude ICD 2007. b. H/o paroxysmal VT/VF including VT storm 12/2012 admission prompting amio initiation;  c. 01/2013 ICD upgrade SJM 1411-36Q Ellipse VR single lead ICD.  [name] Pulmonary HTN    a. Mild by cath 01/2013.    ROS:   All systems reviewed and negative except as noted  in the HPI.   Past Surgical History:  Procedure Laterality Date  . CARDIAC CATHETERIZATION     Nonobstructive coronary disease 2009  . CARDIAC DEFIBRILLATOR PLACEMENT     ICD-St. Jude  . IMPLANTABLE CARDIOVERTER DEFIBRILLATOR (ICD) GENERATOR CHANGE N/A 01/15/2014   Procedure: ICD GENERATOR CHANGE;  Surgeon: Evans Lance, MD;  Location[facility] CATH LAB;  Service: Cardiovascular;  Laterality: N/A;  . LEFT AND RIGHT HEART CATHETERIZATION WITH CORONARY ANGIOGRAM N/A 01/03/2013   Procedure: LEFT AND RIGHT HEART CATHETERIZATION WITH CORONARY ANGIOGRAM;  Surgeon: Peter M Martinique, MD;  Location: [facility] CATH LAB;  Service: Cardiovascular;  Laterality: N/A;  . LIPOMA EXCISION       Family History  Problem Relation Age of Onset  . Coronary artery disease Mother   . Heart attack Mother   . Heart attack Maternal Uncle   . Heart attack Maternal Grandmother      Social History   Social History  . Marital status: Married    Spouse name: N/A  . Number of children: 2  . 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 Smoking status: Former Smoker    Quit date: 05/04/1986  . Smokeless tobacco: Never Used     Comment: Quit smoking 30 yrs ago. Smoked as teenager less than 1/2 ppd. Smoked x 4 years.  . Alcohol use No     Comment: occasionally  . Drug use: No  . Sexual activity: No   Other Topics Concern  . Not on file   Social History Narrative  . No narrative on file     BP 130/86   Pulse 84   Ht 6' (1.829 m)   Wt 285 lb 6.4 oz (129.5 kg)   BMI 38.71 kg/m   Physical Exam:  Well appearing middle-aged man, NAD HEENT: Unremarkable Neck:  6 cm JVD, no thyromegall Back:  No CVA tenderness Lungs:  Clear with no wheezes, rales, or rhonchi. HEART:  IRegular rate rhythm, no murmurs, no rubs, no clicks Abd:  soft, positive bowel sounds, no organomegally, no rebound, no guarding Ext:  2 plus pulses, no edema, no cyanosis, no  clubbing Skin:  No rashes no nodules Neuro:  CN II through XII intact, motor grossly intact   DEVICE  Normal device function.  See PaceArt for details.   Assess/Plan: 1. Chronic systolic heart failure - his symptoms remain class 2. He is encouraged to maintain a low sodium diet. 2. VT - he will continue his amiodarone. 3. Obesity - he is encouraged to lose weight. 4. ICD -  his St. Jude device is working normally. Will follow.  Mikle Bosworth.D.

## 2016-05-12 NOTE — Patient Instructions (Addendum)
Medication Instructions:  Your physician recommends that you continue on your current medications as directed. Please refer to the Current Medication list given to you today.   Labwork: None Ordered   Testing/Procedures: None Ordered   Follow-Up: Your physician wants you to follow-up in: 1 year with Dr. Lovena Le. You will receive a reminder letter in the mail two months in advance. If you don't receive a letter, please call our office to schedule the follow-up appointment.  Remote monitoring is used to monitor your ICD from home. This monitoring reduces the number of office visits required to check your device to one time per year. It allows [place] to keep an eye on the functioning of your device to ensure it is working properly. You are scheduled for a device check from home on 08/11/16. You may send your transmission at any time that day. If you have a wireless device, the transmission will be sent automatically. After your physician reviews your transmission, you will receive a postcard with your next transmission date.    Any Other Special Instructions Will Be Listed Below (If Applicable).     If you need a refill on your cardiac medications before your next appointment, please call your pharmacy.

## 2016-05-24 ENCOUNTER — Other Ambulatory Visit: Payer: Self-pay | Admitting: Internal Medicine

## 2016-05-26 ENCOUNTER — Telehealth: Payer: Self-pay | Admitting: *Deleted

## 2016-05-26 ENCOUNTER — Other Ambulatory Visit: Payer: Self-pay | Admitting: General Practice

## 2016-05-26 MED ORDER — WARFARIN SODIUM 5 MG PO TABS: ORAL_TABLET | ORAL | 1 refills | Status: DC

## 2016-05-26 NOTE — Telephone Encounter (Signed)
Rec'd faxed stating please clarify the directions for rx warfarin 5 mg. We can no longer accept " Use as directed" for directions. Forwarding msg to [facility] was sent on 05/12/16. Can send updated script electronically or call [phone] w/order # [id number]...[name]

## 2016-05-27 NOTE — Telephone Encounter (Signed)
Per chart cindy sent updated script w/directions back to [facility]. Closing encounter...Johny Chess

## 2016-06-05 ENCOUNTER — Ambulatory Visit (INDEPENDENT_AMBULATORY_CARE_PROVIDER_SITE_OTHER): Payer: Medicare Other | Admitting: General Practice

## 2016-06-05 DIAGNOSIS — Z8679 Personal history of other diseases of the circulatory system: Secondary | ICD-10-CM

## 2016-06-05 DIAGNOSIS — Z5181 Encounter for therapeutic drug level monitoring: Secondary | ICD-10-CM

## 2016-06-05 DIAGNOSIS — I4891 Unspecified atrial fibrillation: Secondary | ICD-10-CM

## 2016-06-05 LAB — POCT INR: INR: 3.4

## 2016-06-05 NOTE — Progress Notes (Signed)
I have reviewed and agree with the plan. 

## 2016-06-05 NOTE — Patient Instructions (Signed)
Pre visit review using our clinic review tool, if applicable. No additional management support is needed unless otherwise documented below in the visit note. 

## 2016-06-06 ENCOUNTER — Other Ambulatory Visit: Payer: Self-pay | Admitting: Internal Medicine

## 2016-06-13 ENCOUNTER — Other Ambulatory Visit: Payer: Self-pay | Admitting: Internal Medicine

## 2016-06-13 ENCOUNTER — Other Ambulatory Visit: Payer: Self-pay | Admitting: Cardiology

## 2016-06-17 ENCOUNTER — Other Ambulatory Visit: Payer: Self-pay | Admitting: Internal Medicine

## 2016-06-29 ENCOUNTER — Other Ambulatory Visit: Payer: Self-pay | Admitting: Internal Medicine

## 2016-07-03 ENCOUNTER — Ambulatory Visit: Payer: Medicare Other

## 2016-07-08 ENCOUNTER — Ambulatory Visit (INDEPENDENT_AMBULATORY_CARE_PROVIDER_SITE_OTHER): Payer: Medicare Other | Admitting: General Practice

## 2016-07-08 DIAGNOSIS — Z5181 Encounter for therapeutic drug level monitoring: Secondary | ICD-10-CM | POA: Diagnosis not present

## 2016-07-08 LAB — POCT INR: INR: 3.8

## 2016-07-08 NOTE — Patient Instructions (Signed)
Pre visit review using our clinic review tool, if applicable. No additional management support is needed unless otherwise documented below in the visit note. 

## 2016-07-08 NOTE — Progress Notes (Signed)
I have reviewed and agree with the plan. 

## 2016-07-14 NOTE — Progress Notes (Signed)
Pre visit review using our clinic review tool, if applicable. No additional management support is needed unless otherwise documented below in the visit note. 

## 2016-07-14 NOTE — Progress Notes (Signed)
Subjective:   [name] is a 61 y.o. male who presents for an Initial Medicare Annual Wellness Visit.  Review of Systems  No ROS.  Medicare Wellness Visit.  Cardiac Risk Factors include: diabetes mellitus;dyslipidemia;hypertension;advanced age (>7men, >1 women);obesity (BMI >30kg/m2) Sleep patterns: gets up 2 times nightly to void and sleeps 6 hours nightly.   Discussed recommended tips to increase quality of sleep Home Safety/Smoke Alarms:  Feels safe in home. Smoke alarms in place.  Living environment; residence and Firearm Safety: 1-story house/ trailer, no firearms. Lives with wife Seat Belt Safety/Bike Helmet: Wears seat belt.  Counseling:   Eye Exam- yearly due to diabetes, Patient has an appointment 4/18  Dental- Resources given  Male:   CCS- Last 02/07/07, recall 10 years    PSA-  Lab Results  Component Value Date   PSA 0.44 07/17/2015   PSA 0.48 01/17/2015   PSA 0.52 03/23/2014       Objective:    Today's Vitals   07/15/16 1033 07/15/16 1124  BP: (!) 126/92   Pulse: 78   Temp: 98.3 F (36.8 C)   SpO2: 96%   Weight: 289 lb (131.1 kg)   Height: 6' (1.829 m)   PainSc:  6    Body mass index is 39.2 kg/m.  Current Medications (verified) Outpatient Encounter Prescriptions as of 07/15/2016  Medication Sig  . ACCU-CHEK SOFTCLIX LANCETS lancets Use to help check blood sugar once a day Dx e11.9  . allopurinol (ZYLOPRIM) 300 MG tablet TAKE 1 TABLET BY MOUTH  DAILY  . amiodarone (PACERONE) 200 MG tablet Take 1 tablet (200 mg total) by mouth daily. Yearly physical due in march must see MD for refills  . amLODipine (NORVASC) 10 MG tablet Take 1 tablet (10 mg total) by mouth daily. Yearly physical due in March must see MD for refilld  . atorvastatin (LIPITOR) 80 MG tablet TAKE 1 TABLET BY MOUTH AT  BEDTIME  . B-D ULTRAFINE III SHORT PEN 31G X 8 MM MISC USE ONCE DAILY WITH INSULIN. DIAGNOSIS  . carvedilol (COREG) 25 MG tablet TAKE 2 TABLETS BY MOUTH 2  TIMES  DAILY WITH A MEAL.  . cyclobenzaprine (FLEXERIL) 10 MG tablet Take 1 tablet (10 mg total) by mouth 3 (three) times daily as needed for muscle spasms.  . furosemide (LASIX) 80 MG tablet TAKE 1 TABLET BY MOUTH  DAILY  . glucose blood (ACCU-CHEK SMARTVIEW) test strip 1 each by Other route daily. Use to check blood sugar once a day Dx e11.9  . hydrALAZINE (APRESOLINE) 50 MG tablet TAKE 1 TABLET (50 MG TOTAL) BY MOUTH EVERY 8 (EIGHT) HOURS.  . isosorbide dinitrate (ISORDIL) 20 MG tablet TAKE 1 TABLET BY MOUTH 3  TIMES DAILY  . LANTUS SOLOSTAR 100 UNIT/ML Solostar Pen INJECT 10 UNITS INTO THE SKIN AT BEDTIME.  . Multiple Vitamin (MULTIVITAMIN) capsule Take 1 capsule by mouth daily.   . nitroGLYCERIN (NITROSTAT) 0.4 MG SL tablet PLACE 1 TABLET UNDER TONGUE EVERY 5 MINS X3 DOSES AS NEEDED FOR CHEST PAIN  . potassium chloride SA (K-DUR,KLOR-CON) 20 MEQ tablet TAKE 2 TABLETS BY MOUTH  DAILY  . spironolactone (ALDACTONE) 25 MG tablet TAKE 1 TABLET BY MOUTH  DAILY  . spironolactone (ALDACTONE) 25 MG tablet TAKE 1 TABLET (25 MG TOTAL) BY MOUTH DAILY.  . traMADol (ULTRAM) 50 MG tablet Take 1 tablet (50 mg total) by mouth every 8 (eight) hours as needed.  . warfarin (COUMADIN) 5 MG tablet Take 2 tablets (10 mg)  on Sun/Tues/Thurs/Sat and take 1 tablet (5 mg) on Mon/Wed/Friday OR  AS DIRECTED BY ANTICOAGULATION CLINIC  . [DISCONTINUED] amLODipine (NORVASC) 10 MG tablet TAKE 1 TABLET (10 MG TOTAL) BY MOUTH DAILY.  . [DISCONTINUED] hydrALAZINE (APRESOLINE) 50 MG tablet TAKE 1 TABLET (50 MG TOTAL) BY MOUTH EVERY 8 (EIGHT) HOURS.   No facility-administered encounter medications on file as of 07/15/2016.     Allergies (verified) Patient has no known allergies.   History: Past Medical History:  Diagnosis Date  . CAD (coronary artery disease)    a. Initial nonobst by cath 2009. b. Cath 01/2013 in setting of VT storm: obstructive distal Cx disease (small and terminates in the AV groove, unlikely to cause  significant ischemia or electrical instability), nonobstructive RCA disease, EF 15-20%.   . Cerebrovascular accident [facility])    a. 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 Chronic systolic CHF (congestive heart failure) ([place])    a. Likely NICM (out of proportion to CAD). b. 2009 - EF 25-30% by echo, 01/2013: 15-20% by cath. c. 11/2014 Echo: EF 35-40%, Gr1 DD, mild MR, mod TR, PASP 80mmHg.  . CKD (chronic kidney disease), stage II   . Dyslipidemia   . Gout   . HTN (hypertension)   . Hypokalemia   . ICD (implantable cardiac defibrillator) in place   . Insulin dependent diabetes mellitus ([place])   . Lipoma   . Nonischemic cardiomyopathy ([place])    a.  11/2014 Echo: EF 35-40%, Gr1 DD, mild MR, mod TR, PASP 72mmHg.  . OSA (obstructive sleep apnea)    does not wear cpap  . PAF (paroxysmal atrial fibrillation) ([place])    a. Noted 05/2008 by EKG;  b. CHA2DS2VASc = 5-6-->coumadin.  . Paroxysmal VT ([place])    a. s/p St. Jude ICD 2007. b. H/o paroxysmal VT/VF including VT storm 12/2012 admission prompting amio initiation;  c. 01/2013 ICD upgrade SJM 1411-36Q Ellipse VR single lead ICD.  [name] Pulmonary HTN    a. Mild by cath 01/2013.   Past Surgical History:  Procedure Laterality Date  . CARDIAC CATHETERIZATION     Nonobstructive coronary disease 2009  . CARDIAC DEFIBRILLATOR PLACEMENT     ICD-St. Jude  . IMPLANTABLE CARDIOVERTER DEFIBRILLATOR (ICD) GENERATOR CHANGE N/A 01/15/2014   Procedure: ICD GENERATOR CHANGE;  Surgeon: Evans Lance, MD;  Location: [facility] CATH LAB;  Service: Cardiovascular;  Laterality: N/A;  . LEFT AND RIGHT HEART CATHETERIZATION WITH CORONARY ANGIOGRAM N/A 01/03/2013   Procedure: LEFT AND RIGHT HEART CATHETERIZATION WITH CORONARY ANGIOGRAM;  Surgeon: Peter M Martinique, MD;  Location: [facility] CATH LAB;  Service: Cardiovascular;  Laterality: N/A;  . LIPOMA EXCISION     Family History  Problem Relation Age of Onset  . Coronary artery disease Mother   . Heart attack Mother   . Heart attack Maternal Uncle    . Heart attack Maternal Grandmother    Social History   Occupational History  . custodian [employer]   Social History Main Topics  . Smoking status: Former Smoker    Quit date: 05/04/1986  . Smokeless tobacco: Never Used     Comment: Quit smoking 30 yrs ago. Smoked as teenager less than 1/2 ppd. Smoked x 4 years.  . Alcohol use No     Comment: occasionally  . Drug use: No  . Sexual activity: No   Tobacco Counseling Counseling given: Not Answered   Activities of Daily Living In your present state of health, do you have any difficulty performing the  following activities: 07/15/2016  Hearing? N  Vision? N  Difficulty concentrating or making decisions? N  Walking or climbing stairs? N  Dressing or bathing? N  Doing errands, shopping? N  Preparing Food and eating ? N  Using the Toilet? N  In the past six months, have you accidently leaked urine? N  Do you have problems with loss of bowel control? N  Managing your Medications? N  Managing your Finances? N  Housekeeping or managing your Housekeeping? N  Some recent data might be hidden    Immunizations and Health Maintenance Immunization History  Administered Date(s) Administered  . H1N1 06/11/2008  . Influenza Split 02/22/2012  . Influenza Whole 03/21/2007, 02/25/2009, 02/18/2010  . Influenza, Seasonal, Injecte, Preservative Fre 12/06/2015  . Influenza,inj,Quad PF,36+ Mos 03/10/2013, 02/28/2014, 01/09/2015  . Influenza-Unspecified 12/17/2015  . Pneumococcal Conjugate-13 03/23/2014, 09/25/2014, 07/03/2015  . Pneumococcal Polysaccharide-23 05/05/2007, 07/15/2016  . Td 09/06/2007   Health Maintenance Due  Topic Date Due  . PNEUMOCOCCAL POLYSACCHARIDE VACCINE (2) 05/04/2012  . HEMOGLOBIN A1C  07/14/2016    Patient Care Team: Biagio Borg, MD as PCP - General Biagio Borg, MD (Internal Medicine)  Indicate any recent Medical Services you may have received from other than [facility] providers in the past year (date  may be approximate).    Assessment:   This is a routine wellness examination for [place]. Physical assessment deferred to PCP.   Hearing/Vision screen Hearing Screening Comments: Able to hear conversational tones w/o difficulty. No issues reported.   Vision Screening Comments: Wears reading glasses  Dietary issues and exercise activities discussed: Current Exercise Habits: The patient does not participate in regular exercise at present (Silver sneakers), Exercise limited by: orthopedic condition(s);cardiac condition(s) Diet (meal preparation, eat out, water intake, caffeinated beverages, dairy products, fruits and vegetables): in general, a "healthy" diet  , diabetic, low fat/ cholesterol, low salt  Patient states he reads labels to limit his salt, fat, sugar, and carbohydrate intake  Goals    . Exercise 3x per week (60 min per time)          After I get my knee better, I plan to start working out with my trainer again and increase my activity.       Depression Screen PHQ 2/9 Scores 07/15/2016 07/17/2015 03/23/2014  PHQ - 2 Score 0 0 0    Fall Risk Fall Risk  07/15/2016 07/17/2015 03/23/2014  Falls in the past year? No No No  Risk for fall due to : Impaired mobility - -    Cognitive Function: MMSE - Mini Mental State Exam 07/15/2016  Orientation to time 5  Orientation to Place 5  Registration 3  Attention/ Calculation 5  Recall 3  Language- name 2 objects 2  Language- repeat 1  Language- follow 3 step command 3  Language- read & follow direction 1  Write a sentence 1  Copy design 1  Total score 30        Screening Tests Health Maintenance  Topic Date Due  . PNEUMOCOCCAL POLYSACCHARIDE VACCINE (2) 05/04/2012  . HEMOGLOBIN A1C  07/14/2016  . FOOT EXAM  07/16/2016  . URINE MICROALBUMIN  07/16/2016  . OPHTHALMOLOGY EXAM  08/04/2016  . COLONOSCOPY  02/06/2017  . TETANUS/TDAP  09/05/2017  . INFLUENZA VACCINE  Completed  . Hepatitis C Screening  Completed  . HIV  Screening  Completed        Plan:    Continue to eat heart healthy diet (full of fruits, vegetables, whole  grains, lean protein, water--limit salt, fat, and sugar intake) and increase physical activity as tolerated.  Continue doing brain stimulating activities (puzzles, reading, adult coloring books, staying active) to keep memory sharp.   During the course of the visit Abdirahman was educated and counseled about the following appropriate screening and preventive services:   Vaccines to include Pneumoccal, Influenza, Hepatitis B, Td, Zostavax, HCV  Colorectal cancer screening  Cardiovascular disease screening  Diabetes screening  Glaucoma screening  Nutrition counseling  Prostate cancer screening   Patient Instructions (the written plan) were given to the patient.   Michiel Cowboy, RN   07/15/2016   Medical screening examination/treatment/procedure(s) were performed by non-physician practitioner and as supervising physician I was immediately available for consultation/collaboration. I agree with above. Michiel Cowboy, RN

## 2016-07-15 ENCOUNTER — Other Ambulatory Visit (INDEPENDENT_AMBULATORY_CARE_PROVIDER_SITE_OTHER): Payer: Medicare Other

## 2016-07-15 ENCOUNTER — Encounter: Payer: Self-pay | Admitting: Internal Medicine

## 2016-07-15 ENCOUNTER — Ambulatory Visit (INDEPENDENT_AMBULATORY_CARE_PROVIDER_SITE_OTHER): Payer: Medicare Other | Admitting: Internal Medicine

## 2016-07-15 ENCOUNTER — Ambulatory Visit: Payer: Medicare Other

## 2016-07-15 VITALS — BP 126/92 | HR 78 | Temp 98.3°F | Ht 72.0 in | Wt 289.0 lb

## 2016-07-15 DIAGNOSIS — Z23 Encounter for immunization: Secondary | ICD-10-CM | POA: Diagnosis not present

## 2016-07-15 DIAGNOSIS — Z794 Long term (current) use of insulin: Secondary | ICD-10-CM | POA: Diagnosis not present

## 2016-07-15 DIAGNOSIS — E119 Type 2 diabetes mellitus without complications: Secondary | ICD-10-CM

## 2016-07-15 DIAGNOSIS — Z0000 Encounter for general adult medical examination without abnormal findings: Secondary | ICD-10-CM

## 2016-07-15 DIAGNOSIS — M25562 Pain in left knee: Secondary | ICD-10-CM | POA: Diagnosis not present

## 2016-07-15 LAB — HEPATIC FUNCTION PANEL
ALT: 19 U/L (ref 0–53)
AST: 19 U/L (ref 0–37)
Albumin: 4.1 g/dL (ref 3.5–5.2)
Alkaline Phosphatase: 70 U/L (ref 39–117)
BILIRUBIN TOTAL: 1.6 mg/dL — AB (ref 0.2–1.2)
Bilirubin, Direct: 0.3 mg/dL (ref 0.0–0.3)
TOTAL PROTEIN: 7.2 g/dL (ref 6.0–8.3)

## 2016-07-15 LAB — PSA: PSA: 0.84 ng/mL (ref 0.10–4.00)

## 2016-07-15 LAB — BASIC METABOLIC PANEL
BUN: 24 mg/dL — AB (ref 6–23)
CHLORIDE: 108 meq/L (ref 96–112)
CO2: 31 mEq/L (ref 19–32)
Calcium: 9.5 mg/dL (ref 8.4–10.5)
Creatinine, Ser: 1.42 mg/dL (ref 0.40–1.50)
GFR: 65.35 mL/min (ref >60.00)
Glucose, Bld: 101 mg/dL — ABNORMAL HIGH (ref 70–99)
POTASSIUM: 4.3 meq/L (ref 3.5–5.1)
Sodium: 144 mEq/L (ref 135–145)

## 2016-07-15 LAB — URINALYSIS, ROUTINE W REFLEX MICROSCOPIC
Bilirubin Urine: NEGATIVE
HGB URINE DIPSTICK: NEGATIVE
KETONES UR: NEGATIVE
LEUKOCYTES UA: NEGATIVE
NITRITE: NEGATIVE
Specific Gravity, Urine: 1.01 (ref 1.000–1.030)
URINE GLUCOSE: NEGATIVE
UROBILINOGEN UA: 4 — AB (ref 0.0–1.0)
pH: 6.5 (ref 5.0–8.0)

## 2016-07-15 LAB — CBC WITH DIFFERENTIAL/PLATELET
BASOS ABS: 0 10*3/uL (ref 0.0–0.1)
BASOS PCT: 0.7 % (ref 0.0–3.0)
EOS ABS: 0.1 10*3/uL (ref 0.0–0.7)
Eosinophils Relative: 0.9 % (ref 0.0–5.0)
HEMATOCRIT: 44.4 % (ref 39.0–52.0)
HEMOGLOBIN: 14.5 g/dL (ref 13.0–17.0)
LYMPHS PCT: 9.5 % — AB (ref 12.0–46.0)
Lymphs Abs: 0.6 10*3/uL — ABNORMAL LOW (ref 0.7–4.0)
MCHC: 32.7 g/dL (ref 30.0–36.0)
MCV: 99.3 fl (ref 78.0–100.0)
Monocytes Absolute: 0.5 10*3/uL (ref 0.1–1.0)
Monocytes Relative: 8.6 % (ref 3.0–12.0)
Neutro Abs: 5.1 10*3/uL (ref 1.4–7.7)
Neutrophils Relative %: 80.3 % — ABNORMAL HIGH (ref 43.0–77.0)
Platelets: 176 10*3/uL (ref 150.0–400.0)
RBC: 4.47 Mil/uL (ref 4.22–5.81)
RDW: 14.5 % (ref 11.5–15.5)
WBC: 6.4 10*3/uL (ref 4.0–10.5)

## 2016-07-15 LAB — MICROALBUMIN / CREATININE URINE RATIO
Creatinine,U: 307.1 mg/dL
Microalb Creat Ratio: 1.5 mg/g (ref 0.0–30.0)
Microalb, Ur: 4.6 mg/dL — ABNORMAL HIGH (ref 0.0–1.9)

## 2016-07-15 LAB — LIPID PANEL
CHOLESTEROL: 150 mg/dL (ref 0–200)
HDL: 33.8 mg/dL — ABNORMAL LOW (ref >39.00)
LDL CALC: 97 mg/dL (ref 0–99)
NonHDL: 115.99
Total CHOL/HDL Ratio: 4
Triglycerides: 93 mg/dL (ref 0.0–149.0)
VLDL: 18.6 mg/dL (ref 0.0–40.0)

## 2016-07-15 LAB — HEMOGLOBIN A1C: HEMOGLOBIN A1C: 5.6 % (ref 4.6–6.5)

## 2016-07-15 LAB — TSH: TSH: 1.45 u[IU]/mL (ref 0.35–4.50)

## 2016-07-15 MED ORDER — TRAMADOL HCL 50 MG PO TABS: 50.0000 mg | ORAL_TABLET | Freq: Three times a day (TID) | ORAL | 2 refills | Status: DC | PRN

## 2016-07-15 NOTE — Patient Instructions (Addendum)
Continue to eat heart healthy diet (full of fruits, vegetables, whole grains, lean protein, water--limit salt, fat, and sugar intake) and increase physical activity as tolerated.  Continue doing brain stimulating activities (puzzles, reading, adult coloring books, staying active) to keep memory sharp.   [name] , Thank you for taking time to come for your Medicare Wellness Visit. I appreciate your ongoing commitment to your health goals. Please review the following plan we discussed and let me know if I can assist you in the future.   These are the goals we discussed: Goals    . Exercise 3x per week (60 min per time)          After I get my knee better, I plan to start working out with my trainer again and increase my activity.        This is a list of the screening recommended for you and due dates:  Health Maintenance  Topic Date Due  . Pneumococcal vaccine (2) 05/04/2012  . Hemoglobin A1C  07/14/2016  . Complete foot exam   07/16/2016  . Urine Protein Check  07/16/2016  . Eye exam for diabetics  08/04/2016  . Colon Cancer Screening  02/06/2017  . Tetanus Vaccine  09/05/2017  . Flu Shot  Completed  .  Hepatitis C: One time screening is recommended by Center for Disease Control  (CDC) for  adults born from [birth year] through 1965.   Completed  . HIV Screening  Completed    You had the Pneumovax pneumonia shot today  Please continue all other medications as before, and refills have been done if requested.  Please have the pharmacy call with any other refills you may need.  Please continue your efforts at being more active, low cholesterol diet, and weight control.  You are otherwise up to date with prevention measures today.  Please keep your appointments with your specialists as you may have planned  Please go to the LAB in the Basement (turn left off the elevator) for the tests to be done today  You will be contacted by phone if any changes need to be made immediately.   Otherwise, you will receive a letter about your results with an explanation, but please check with MyChart first.  Please remember to sign up for MyChart if you have not done so, as this will be important to you in the future with finding out test results, communicating by private email, and scheduling acute appointments online when needed.  Please return in 6 months, or sooner if needed, with Lab testing done 3-5 days before

## 2016-07-15 NOTE — Progress Notes (Signed)
Subjective:    Patient ID: [name], male    DOB: 11/24/1955, 61 y.o.   MRN: [id number]  HPI Here for wellness and f/u;  Overall doing ok;  Pt denies Chest pain, worsening SOB, DOE, wheezing, orthopnea, PND, worsening LE edema, palpitations, dizziness or syncope.  Pt denies neurological change such as new headache, facial or extremity weakness.  Pt denies polydipsia, polyuria, or low sugar symptoms. Pt states overall good compliance with treatment and medications, good tolerability, and has been trying to follow appropriate diet.  Pt denies worsening depressive symptoms, suicidal ideation or panic. No fever, night sweats, wt loss, loss of appetite, or other constitutional symptoms.  Pt states good ability with ADL's, has low fall risk, home safety reviewed and adequate, no other significant changes in hearing or vision, and only occasionally active with exercise.  No other new exam findings  States has had prevnar. Asking for pneumovax.  Also with mild worsening of previous Left knee pain and pop with doing leg presses at the gym last wk Has hx of right knee effusion with cortisone per [facility].  Asks for referral back.  Past Medical History:  Diagnosis Date  . CAD (coronary artery disease)    a. Initial nonobst by cath 2009. b. Cath 01/2013 in setting of VT storm: obstructive distal Cx disease (small and terminates in the AV groove, unlikely to cause significant ischemia or electrical instability), nonobstructive RCA disease, EF 15-20%.   . Cerebrovascular accident [facility])    a. 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 Chronic systolic CHF (congestive heart failure) ([place])    a. Likely NICM (out of proportion to CAD). b. 2009 - EF 25-30% by echo, 01/2013: 15-20% by cath. c. 11/2014 Echo: EF 35-40%, Gr1 DD, mild MR, mod TR, PASP 6mmHg.  . CKD (chronic kidney disease), stage II   . Dyslipidemia   . Gout   . HTN (hypertension)   . Hypokalemia   . ICD (implantable cardiac  defibrillator) in place   . Insulin dependent diabetes mellitus ([place])   . Lipoma   . Nonischemic cardiomyopathy ([place])    a.  11/2014 Echo: EF 35-40%, Gr1 DD, mild MR, mod TR, PASP 75mmHg.  . OSA (obstructive sleep apnea)    does not wear cpap  . PAF (paroxysmal atrial fibrillation) ([place])    a. Noted 05/2008 by EKG;  b. CHA2DS2VASc = 5-6-->coumadin.  . Paroxysmal VT ([place])    a. s/p St. Jude ICD 2007. b. H/o paroxysmal VT/VF including VT storm 12/2012 admission prompting amio initiation;  c. 01/2013 ICD upgrade SJM 1411-36Q Ellipse VR single lead ICD.  [name] Pulmonary HTN    a. Mild by cath 01/2013.   Past Surgical History:  Procedure Laterality Date  . CARDIAC CATHETERIZATION     Nonobstructive coronary disease 2009  . CARDIAC DEFIBRILLATOR PLACEMENT     ICD-St. Jude  . IMPLANTABLE CARDIOVERTER DEFIBRILLATOR (ICD) GENERATOR CHANGE N/A 01/15/2014   Procedure: ICD GENERATOR CHANGE;  Surgeon: Evans Lance, MD;  Location: [facility] CATH LAB;  Service: Cardiovascular;  Laterality: N/A;  . LEFT AND RIGHT HEART CATHETERIZATION WITH CORONARY ANGIOGRAM N/A 01/03/2013   Procedure: LEFT AND RIGHT HEART CATHETERIZATION WITH CORONARY ANGIOGRAM;  Surgeon: Peter M Martinique, MD;  Location[facility] C[facility] LAB;  Service: Cardiovascular;  Laterality: N/A;  . LIPOMA EXCISION      reports that he quit smoking about 30 years ago. He has never used smokeless tobacco. He reports that he does not drink alcohol  or use drugs. family history includes Coronary artery disease in his mother; Heart attack in his maternal grandmother, maternal uncle, and mother. No Known Allergies Current Outpatient Prescriptions on File Prior to Visit  Medication Sig Dispense Refill  . ACCU-CHEK SOFTCLIX LANCETS lancets Use to help check blood sugar once a day Dx e11.9 100 each 3  . allopurinol (ZYLOPRIM) 300 MG tablet TAKE 1 TABLET BY MOUTH  DAILY 90 tablet 0  . amiodarone (PACERONE) 200 MG tablet Take 1 tablet (200 mg total) by mouth daily. Yearly  physical due in march must see MD for refills 90 tablet 0  . amLODipine (NORVASC) 10 MG tablet Take 1 tablet (10 mg total) by mouth daily. Yearly physical due in March must see MD for refilld 90 tablet 0  . atorvastatin (LIPITOR) 80 MG tablet TAKE 1 TABLET BY MOUTH AT  BEDTIME 90 tablet 0  . B-D ULTRAFINE III SHORT PEN 31G X 8 MM MISC USE ONCE DAILY WITH INSULIN. DIAGNOSIS 100 each 1  . carvedilol (COREG) 25 MG tablet TAKE 2 TABLETS BY MOUTH 2  TIMES DAILY WITH A MEAL. 360 tablet 1  . cyclobenzaprine (FLEXERIL) 10 MG tablet Take 1 tablet (10 mg total) by mouth 3 (three) times daily as needed for muscle spasms. 15 tablet 0  . furosemide (LASIX) 80 MG tablet TAKE 1 TABLET BY MOUTH  DAILY 90 tablet 0  . glucose blood (ACCU-CHEK SMARTVIEW) test strip 1 each by Other route daily. Use to check blood sugar once a day Dx e11.9 100 each 3  . hydrALAZINE (APRESOLINE) 50 MG tablet TAKE 1 TABLET (50 MG TOTAL) BY MOUTH EVERY 8 (EIGHT) HOURS. 270 tablet 1  . isosorbide dinitrate (ISORDIL) 20 MG tablet TAKE 1 TABLET BY MOUTH 3  TIMES DAILY 270 tablet 0  . LANTUS SOLOSTAR 100 UNIT/ML Solostar Pen INJECT 10 UNITS INTO THE SKIN AT BEDTIME. 15 mL 2  . Multiple Vitamin (MULTIVITAMIN) capsule Take 1 capsule by mouth daily.     . nitroGLYCERIN (NITROSTAT) 0.4 MG SL tablet PLACE 1 TABLET UNDER TONGUE EVERY 5 MINS X3 DOSES AS NEEDED FOR CHEST PAIN 25 tablet 1  . potassium chloride SA (K-DUR,KLOR-CON) 20 MEQ tablet TAKE 2 TABLETS BY MOUTH  DAILY 180 tablet 0  . spironolactone (ALDACTONE) 25 MG tablet TAKE 1 TABLET BY MOUTH  DAILY 90 tablet 0  . spironolactone (ALDACTONE) 25 MG tablet TAKE 1 TABLET (25 MG TOTAL) BY MOUTH DAILY. 90 tablet 0  . warfarin (COUMADIN) 5 MG tablet Take 2 tablets (10 mg) on Sun/Tues/Thurs/Sat and take 1 tablet (5 mg) on Mon/Wed/Friday OR  AS DIRECTED BY ANTICOAGULATION CLINIC 180 tablet 1   No current facility-administered medications on file prior to visit.     Review of  Systems Constitutional: Negative for increased diaphoresis, or other activity, appetite or siginficant weight change other than noted HENT: Negative for worsening hearing loss, ear pain, facial swelling, mouth sores and neck stiffness.   Eyes: Negative for other worsening pain, redness or visual disturbance.  Respiratory: Negative for choking or stridor Cardiovascular: Negative for other chest pain and palpitations.  Gastrointestinal: Negative for worsening diarrhea, blood in stool, or abdominal distention Genitourinary: Negative for hematuria, flank pain or change in urine volume.  Musculoskeletal: Negative for myalgias or other joint complaints.  Skin: Negative for other color change and wound or drainage.  Neurological: Negative for syncope and numbness. other than noted Hematological: Negative for adenopathy. or other swelling Psychiatric/Behavioral: Negative for hallucinations, SI, self-injury, decreased concentration  or other worsening agitation.  All other system neg per pt    Objective:   Physical Exam BP (!) 126/92   Pulse 78   Temp 98.3 F (36.8 C)   Ht 6' (1.829 m)   Wt 289 lb (131.1 kg)   SpO2 96%   BMI 39.20 kg/m  VS noted,  Constitutional: Pt is oriented to person, place, and time. Appears well-developed and well-nourished, in no significant distress Head: Normocephalic and atraumatic  Eyes: Conjunctivae and EOM are normal. Pupils are equal, round, and reactive to light Right Ear: External ear normal.  Left Ear: External ear normal Nose: Nose normal.  Mouth/Throat: Oropharynx is clear and moist  Neck: Normal range of motion. Neck supple. No JVD present. No tracheal deviation present or significant neck LA or mass Cardiovascular: Normal rate, regular rhythm, normal heart sounds and intact distal pulses.   Pulmonary/Chest: Effort normal and breath sounds without rales or wheezing  Abdominal: Soft. Bowel sounds are normal. NT. No HSM  Musculoskeletal: Normal range of  motion. Exhibits no edema except right knee with 1-2+ effusion, NT but reduced ROM Lymphadenopathy: Has no cervical adenopathy.  Neurological: Pt is alert and oriented to person, place, and time. Pt has normal reflexes. No cranial nerve deficit. Motor grossly intact Skin: Skin is warm and dry. No rash noted or new ulcers Psychiatric:  Has normal mood and affect. Behavior is normal.  No other exam findings    Assessment & Plan:

## 2016-07-19 DIAGNOSIS — M25562 Pain in left knee: Secondary | ICD-10-CM | POA: Insufficient documentation

## 2016-07-19 NOTE — Assessment & Plan Note (Signed)

## 2016-07-19 NOTE — Assessment & Plan Note (Signed)
Mild to mod, for pain control, refer to ortho as requested,  to f/u any worsening symptoms or concerns

## 2016-07-19 NOTE — Assessment & Plan Note (Signed)
stable overall by history and exam, recent data reviewed with pt, and pt to continue medical treatment as before,  to f/u any worsening symptoms or concerns Lab Results  Component Value Date   HGBA1C 5.6 07/15/2016

## 2016-07-24 DIAGNOSIS — M25561 Pain in right knee: Secondary | ICD-10-CM | POA: Diagnosis not present

## 2016-08-03 ENCOUNTER — Other Ambulatory Visit: Payer: Self-pay | Admitting: Internal Medicine

## 2016-08-04 DIAGNOSIS — H2513 Age-related nuclear cataract, bilateral: Secondary | ICD-10-CM | POA: Diagnosis not present

## 2016-08-04 DIAGNOSIS — E119 Type 2 diabetes mellitus without complications: Secondary | ICD-10-CM | POA: Diagnosis not present

## 2016-08-04 LAB — HM DIABETES EYE EXAM

## 2016-08-05 ENCOUNTER — Ambulatory Visit (INDEPENDENT_AMBULATORY_CARE_PROVIDER_SITE_OTHER): Payer: Medicare Other | Admitting: General Practice

## 2016-08-05 DIAGNOSIS — Z8679 Personal history of other diseases of the circulatory system: Secondary | ICD-10-CM

## 2016-08-05 DIAGNOSIS — Z5181 Encounter for therapeutic drug level monitoring: Secondary | ICD-10-CM | POA: Diagnosis not present

## 2016-08-05 DIAGNOSIS — I4891 Unspecified atrial fibrillation: Secondary | ICD-10-CM

## 2016-08-05 LAB — POCT INR: INR: 2.3

## 2016-08-05 NOTE — Patient Instructions (Signed)
Pre visit review using our clinic review tool, if applicable. No additional management support is needed unless otherwise documented below in the visit note. 

## 2016-08-11 ENCOUNTER — Ambulatory Visit (INDEPENDENT_AMBULATORY_CARE_PROVIDER_SITE_OTHER): Payer: Medicare Other | Admitting: *Deleted

## 2016-08-11 DIAGNOSIS — I428 Other cardiomyopathies: Secondary | ICD-10-CM

## 2016-08-11 NOTE — Progress Notes (Signed)
Remote ICD transmission.   

## 2016-08-12 ENCOUNTER — Encounter: Payer: Self-pay | Admitting: Cardiology

## 2016-08-13 ENCOUNTER — Telehealth: Payer: Self-pay | Admitting: Internal Medicine

## 2016-08-13 NOTE — Telephone Encounter (Signed)
Done hardcopy to Shirron  

## 2016-08-13 NOTE — Telephone Encounter (Signed)
Patient requesting a written script for compression stockings.

## 2016-08-13 NOTE — Telephone Encounter (Signed)
Pt will pick up, at front desk.

## 2016-08-14 LAB — CUP PACEART REMOTE DEVICE CHECK
Battery Remaining Longevity: 79 mo
Battery Remaining Percentage: 78 %
Brady Statistic RV Percent Paced: 1 %
Date Time Interrogation Session: 20180410080016
HIGH POWER IMPEDANCE MEASURED VALUE: 50 Ohm
HIGH POWER IMPEDANCE MEASURED VALUE: 50 Ohm
Implantable Lead Model: 7001
Lead Channel Impedance Value: 410 Ohm
Lead Channel Pacing Threshold Amplitude: 1 V
Lead Channel Sensing Intrinsic Amplitude: 12 mV
Lead Channel Setting Pacing Pulse Width: 0.7 ms
MDC IDC LEAD IMPLANT DT: 20070817
MDC IDC LEAD LOCATION: 753860
MDC IDC MSMT BATTERY VOLTAGE: 2.98 V
MDC IDC MSMT LEADCHNL RV PACING THRESHOLD PULSEWIDTH: 0.7 ms
MDC IDC PG IMPLANT DT: 20150914
MDC IDC PG SERIAL: 7214340
MDC IDC SET LEADCHNL RV PACING AMPLITUDE: 2.5 V
MDC IDC SET LEADCHNL RV SENSING SENSITIVITY: 0.5 mV

## 2016-08-18 ENCOUNTER — Other Ambulatory Visit: Payer: Self-pay | Admitting: Internal Medicine

## 2016-08-21 ENCOUNTER — Encounter: Payer: Self-pay | Admitting: Family Medicine

## 2016-08-21 ENCOUNTER — Ambulatory Visit: Payer: Self-pay

## 2016-08-21 ENCOUNTER — Ambulatory Visit (INDEPENDENT_AMBULATORY_CARE_PROVIDER_SITE_OTHER): Payer: Medicare Other | Admitting: Family Medicine

## 2016-08-21 VITALS — BP 110/78 | HR 80 | Wt 286.0 lb

## 2016-08-21 DIAGNOSIS — M1712 Unilateral primary osteoarthritis, left knee: Secondary | ICD-10-CM

## 2016-08-21 DIAGNOSIS — M25562 Pain in left knee: Secondary | ICD-10-CM | POA: Diagnosis not present

## 2016-08-21 DIAGNOSIS — S83412A Sprain of medial collateral ligament of left knee, initial encounter: Secondary | ICD-10-CM

## 2016-08-21 NOTE — Patient Instructions (Signed)
Nice to meet you  MCL tear but appears partial with some underlying arthritis.  Ice 20 minutes 2 times daily. Usually after activity and before bed. Exercises 3 times a week.  Wear brace daily for next month  pennsaid pinkie amount topically 2 times daily as needed.  Vitamin D 2000 IU dialy  Turmeric 500mg  daily  OK to bike and stay in a straight line.  See me again in 4 weeks.

## 2016-08-21 NOTE — Progress Notes (Signed)
[name] [address] [place], [place] [address] Phone: [phone] Subjective:    I'm seeing this patient by the request  of:  Cathlean Cower, MD   CC: Knee pain  [id number]  [name] is a 61 y.o. male coming in with complaint of left knee pain Patient tries to stay active and goes to the gym most of the time. Patient states that he had pain and audible pop when doing leg presses. Has seen a orthopedic surgeon previously for the right side of his knee. Patient states This is on the left sign. Patient since then he went to the orthopedic physician's first and was given an injection in the knee. Was told that he had severe amount of arthritis in the knee. Patient states that the injection seems to hurt anymore. Having increasing instability. May be associated with some swelling. Patient has not been attempted workout at all. Affecting daily activities are little bit as well. Sometimes worse with going down stairs. Better with rest.    Past Medical History:  Diagnosis Date  . CAD (coronary artery disease)    a. Initial nonobst by cath 2009. b. Cath 01/2013 in setting of VT storm: obstructive distal Cx disease (small and terminates in the AV groove, unlikely to cause significant ischemia or electrical instability), nonobstructive RCA disease, EF 15-20%.   . Cerebrovascular accident [facility])    a. 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 Chronic systolic CHF (congestive heart failure) ([place])    a. Likely NICM (out of proportion to CAD). b. 2009 - EF 25-30% by echo, 01/2013: 15-20% by cath. c. 11/2014 Echo: EF 35-40%, Gr1 DD, mild MR, mod TR, PASP 25mmHg.  . CKD (chronic kidney disease), stage II   . Dyslipidemia   . Gout   . HTN (hypertension)   . Hypokalemia   . ICD (implantable cardiac defibrillator) in place   . Insulin dependent diabetes mellitus ([employer])   . Lipoma   . Nonischemic cardiomyopathy ([place])    a.  11/2014 Echo: EF 35-40%, Gr1 DD, mild MR, mod TR,  PASP 52mmHg.  . OSA (obstructive sleep apnea)    does not wear cpap  . PAF (paroxysmal atrial fibrillation) ([place])    a. Noted 05/2008 by EKG;  b. CHA2DS2VASc = 5-6-->coumadin.  . Paroxysmal VT ([place])    a. s/p St. Jude ICD 2007. b. H/o paroxysmal VT/VF including VT storm 12/2012 admission prompting amio initiation;  c. 01/2013 ICD upgrade SJM 1411-36Q Ellipse VR single lead ICD.  [name] Pulmonary HTN    a. Mild by cath 01/2013.   Past Surgical History:  Procedure Laterality Date  . CARDIAC CATHETERIZATION     Nonobstructive coronary disease 2009  . CARDIAC DEFIBRILLATOR PLACEMENT     ICD-St. Jude  . IMPLANTABLE CARDIOVERTER DEFIBRILLATOR (ICD) GENERATOR CHANGE N/A 01/15/2014   Procedure: ICD GENERATOR CHANGE;  Surgeon: Evans Lance, MD;  Location: [facility] CATH LAB;  Service: Cardiovascular;  Laterality: N/A;  . LEFT AND RIGHT HEART CATHETERIZATION WITH CORONARY ANGIOGRAM N/A 01/03/2013   Procedure: LEFT AND RIGHT HEART CATHETERIZATION WITH CORONARY ANGIOGRAM;  Surgeon: Peter M Martinique, MD;  Location: [facility] CATH LAB;  Service: Cardiovascular;  Laterality: N/A;  . LIPOMA EXCISION     Social History   Social History  . Marital status: Married    Spouse name: N/A  . Number of children: 2  . 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 Smoking status: Former Smoker    Quit date: 05/04/1986  . Smokeless tobacco: Never Used     Comment: Quit smoking 30 yrs ago. Smoked as teenager less than 1/2 ppd. Smoked x 4 years.  . Alcohol use No     Comment: occasionally  . Drug use: No  . Sexual activity: No   Other Topics Concern  . Not on file   Social History Narrative  . No narrative on file   No Known Allergies Family History  Problem Relation Age of Onset  . Coronary artery disease Mother   . Heart attack Mother   . Heart attack Maternal Uncle   . Heart attack Maternal Grandmother     Past medical history, social, surgical and  family history all reviewed in electronic medical record.  No pertanent information unless stated regarding to the chief complaint.   Review of Systems:Review of systems updated and as accurate as of 08/21/16  No headache, visual changes, nausea, vomiting, diarrhea, constipation, dizziness, abdominal pain, skin rash, fevers, chills, night sweats, weight loss, swollen lymph nodes, body aches, joint swelling, muscle aches, chest pain, shortness of breath, mood changes.   Objective  There were no vitals taken for this visit. Systems examined below as of 08/21/16   General: No apparent distress alert and oriented x3 mood and affect normal, dressed appropriately.  HEENT: Pupils equal, extraocular movements intact  Respiratory: Patient's speak in full sentences and does not appear short of breath  Cardiovascular: No lower extremity edema, non tender, no erythema  Skin: Warm dry intact with no signs of infection or rash on extremities or on axial skeleton.  Abdomen: Soft nontender  Neuro: Cranial nerves II through XII are intact, neurovascularly intact in all extremities with 2+ DTRs and 2+ pulses.  Lymph: No lymphadenopathy of posterior or anterior cervical chain or axillae bilaterally.  Gait antalgic gait.  MSK:  Non tender with full range of motion and good stability and symmetric strength and tone of shoulders, elbows, wrist, hip, and ankles bilaterally.  Knee: Left valgus deformity noted. Large thigh to calf ratio.  Tender to palpation over medial and PF joint line.  ROM full in flexion and extension and lower leg rotation. instability with valgus force.  painful patellar compression. Patellar glide with moderate crepitus. Patellar and quadriceps tendons unremarkable. Hamstring and quadriceps strength is normal. Contralateral knee shows mild arthritic changes but very minimal.  MSK US performed of: Left knee This study was ordered, performed, and interpreted by Charlann Boxer  D.O.  Knee: Trace effusion noted. Patient does have moderate osteophytic changes of the medial compartment of the knee. Mild arthritic changes of the patellofemoral joint. Patient has a fairly large high-grade partial tear of the MCL. Small avulsion noted off the tibial component. Mild gapping with dynamic testing.  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      Note: This dictation was prepared with Dragon dictation along with smaller phrase technology. Any transcriptional errors that result from this process are unintentional.

## 2016-08-21 NOTE — Assessment & Plan Note (Signed)
Seems to be partial. This is some underlying arthritis that can be contributing to the pain as well. Patient given a hinge brace, home exercises, icing regimen, patient will start to increase activity as tolerated. Follow-up again with me in 4-6 weeks. At that time consider possible injection and will repeat ultrasound.

## 2016-08-21 NOTE — Assessment & Plan Note (Signed)
Discussed the importance of weight loss, home exercises, discussed icing, discussed over-the-counter medications. We did discuss the patient could be a candidate for viscous supplementation.

## 2016-08-21 NOTE — Progress Notes (Signed)
Pre-visit discussion using our clinic review tool. No additional management support is needed unless otherwise documented below in the visit note.  

## 2016-08-24 ENCOUNTER — Telehealth: Payer: Self-pay | Admitting: Family Medicine

## 2016-08-24 NOTE — Telephone Encounter (Signed)
Spoke with patient letting him know that per [name] patient will likely pay 20% of the brace cost of $149.73. Patient is fine with that.

## 2016-08-24 NOTE — Telephone Encounter (Signed)
Patient states he called his insurance company about covering the brace he got Friday.  The insurance company told him that our office would have to call with an authorization to get coverage. Patient has [place].  Can be reached at [phone].  Patient would like to know what cost he would be looking at b/c he can not afford $300.00.  Please follow back up with patient in regard.

## 2016-08-27 ENCOUNTER — Encounter: Payer: Self-pay | Admitting: Cardiology

## 2016-08-31 NOTE — Telephone Encounter (Signed)
Patient would like some samples of [place]. Will call patient when we get some in office.

## 2016-08-31 NOTE — Telephone Encounter (Signed)
Rec'd call pt is requesting to speak w/[name] concerning msg from last week...[name]

## 2016-09-02 ENCOUNTER — Ambulatory Visit (INDEPENDENT_AMBULATORY_CARE_PROVIDER_SITE_OTHER): Payer: Medicare Other | Admitting: General Practice

## 2016-09-02 DIAGNOSIS — I4891 Unspecified atrial fibrillation: Secondary | ICD-10-CM

## 2016-09-02 DIAGNOSIS — Z5181 Encounter for therapeutic drug level monitoring: Secondary | ICD-10-CM | POA: Diagnosis not present

## 2016-09-02 DIAGNOSIS — Z8679 Personal history of other diseases of the circulatory system: Secondary | ICD-10-CM

## 2016-09-02 LAB — POCT INR: INR: 2.2

## 2016-09-02 NOTE — Patient Instructions (Signed)
Pre visit review using our clinic review tool, if applicable. No additional management support is needed unless otherwise documented below in the visit note. 

## 2016-09-02 NOTE — Progress Notes (Signed)
I have reviewed and agree with the plan. 

## 2016-09-09 NOTE — Telephone Encounter (Signed)
Samples left up front.

## 2016-09-10 ENCOUNTER — Other Ambulatory Visit: Payer: Self-pay | Admitting: Internal Medicine

## 2016-09-15 ENCOUNTER — Other Ambulatory Visit: Payer: Self-pay | Admitting: Internal Medicine

## 2016-09-15 NOTE — Telephone Encounter (Signed)
Done erx 

## 2016-09-18 ENCOUNTER — Ambulatory Visit: Payer: Self-pay

## 2016-09-18 ENCOUNTER — Ambulatory Visit (INDEPENDENT_AMBULATORY_CARE_PROVIDER_SITE_OTHER): Payer: Medicare Other | Admitting: Family Medicine

## 2016-09-18 ENCOUNTER — Encounter: Payer: Self-pay | Admitting: Family Medicine

## 2016-09-18 VITALS — BP 124/80 | HR 77 | Ht 72.0 in | Wt 289.0 lb

## 2016-09-18 DIAGNOSIS — M1712 Unilateral primary osteoarthritis, left knee: Secondary | ICD-10-CM

## 2016-09-18 DIAGNOSIS — M25562 Pain in left knee: Secondary | ICD-10-CM

## 2016-09-18 DIAGNOSIS — G8929 Other chronic pain: Secondary | ICD-10-CM

## 2016-09-18 NOTE — Patient Instructions (Addendum)
Good to see you  Alvera Singh is your friend.  Gave you an injection today  Keep trucking along.  Keep wear the brace daily for 2 weeks then start to not wear it in the house or short errands outside the house.  Exercises 3 times a week.  See me again in 4-5 weeks.

## 2016-09-18 NOTE — Assessment & Plan Note (Signed)
Patient given injection today and tolerated the procedure well. We discussed icing regimen and home exercises. We discussed objective is a doing which ones to avoid. Encourage patient to continue to stay active. Follow-up again in 4 weeks. If no significant improvement at follow-up advance imaging may be warranted.

## 2016-09-18 NOTE — Progress Notes (Signed)
[name] [address], [place] [address] Phone: [phone] Subjective:    I'm seeing this patient by the request  of:  Biagio Borg, MD   CC: Knee pain ff/u  [id number]  [name] is a 61 y.o. male coming in with complaint of left knee pain Patient was seen previously and did have an MCL tear as well as moderate osteophytic changes. Patient was given a brace, home exercise, icing protocol. Patient states 75% improvement. Has noticed that the swelling has gone down. Not having quite as much instability but continues to wear the brace. Still having pain on the medial aspect of the knee. Not stopping him from many daily activities but    Past Medical History:  Diagnosis Date  . CAD (coronary artery disease)    a. Initial nonobst by cath 2009. b. Cath 01/2013 in setting of VT storm: obstructive distal Cx disease (small and terminates in the AV groove, unlikely to cause significant ischemia or electrical instability), nonobstructive RCA disease, EF 15-20%.   . Cerebrovascular accident [facility])    a. 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 Chronic systolic CHF (congestive heart failure) ([place])    a. Likely NICM (out of proportion to CAD). b. 2009 - EF 25-30% by echo, 01/2013: 15-20% by cath. c. 11/2014 Echo: EF 35-40%, Gr1 DD, mild MR, mod TR, PASP 68mmHg.  . CKD (chronic kidney disease), stage II   . Dyslipidemia   . Gout   . HTN (hypertension)   . Hypokalemia   . ICD (implantable cardiac defibrillator) in place   . Insulin dependent diabetes mellitus ([place])   . Lipoma   . Nonischemic cardiomyopathy ([place])    a.  11/2014 Echo: EF 35-40%, Gr1 DD, mild MR, mod TR, PASP 65mmHg.  . OSA (obstructive sleep apnea)    does not wear cpap  . PAF (paroxysmal atrial fibrillation) ([place])    a. Noted 05/2008 by EKG;  b. CHA2DS2VASc = 5-6-->coumadin.  . Paroxysmal VT ([place])    a. s/p St. Jude ICD 2007. b. H/o paroxysmal VT/VF including VT storm 12/2012 admission  prompting amio initiation;  c. 01/2013 ICD upgrade SJM 1411-36Q Ellipse VR single lead ICD.  [name] Pulmonary HTN ([place])    a. Mild by cath 01/2013.   Past Surgical History:  Procedure Laterality Date  . CARDIAC CATHETERIZATION     Nonobstructive coronary disease 2009  . CARDIAC DEFIBRILLATOR PLACEMENT     ICD-St. Jude  . IMPLANTABLE CARDIOVERTER DEFIBRILLATOR (ICD) GENERATOR CHANGE N/A 01/15/2014   Procedure: ICD GENERATOR CHANGE;  Surgeon: Evans Lance, MD;  Location: [facility] CATH LAB;  Service: Cardiovascular;  Laterality: N/A;  . LEFT AND RIGHT HEART CATHETERIZATION WITH CORONARY ANGIOGRAM N/A 01/03/2013   Procedure: LEFT AND RIGHT HEART CATHETERIZATION WITH CORONARY ANGIOGRAM;  Surgeon: Peter M Martinique, MD;  Location: [facility] CATH LAB;  Service: Cardiovascular;  Laterality: N/A;  . LIPOMA EXCISION     Social History   Social History  . Marital status: Married    Spouse name: N/A  . Number of children: 2  . 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 Smoking status: Former Smoker    Quit date: 05/04/1986  . Smokeless tobacco: Never Used     Comment: Quit smoking 30 yrs ago. Smoked as teenager less than 1/2 ppd. Smoked x 4 years.  . Alcohol use No  Comment: occasionally  . Drug use: No  . Sexual activity: No   Other Topics Concern  . None   Social History Narrative  . None   No Known Allergies Family History  Problem Relation Age of Onset  . Coronary artery disease Mother   . Heart attack Mother   . Heart attack Maternal Uncle   . Heart attack Maternal Grandmother     Past medical history, social, surgical and family history all reviewed in electronic medical record.  No pertanent information unless stated regarding to the chief complaint.   Review of Systems: No headache, visual changes, nausea, vomiting, diarrhea, constipation, dizziness, abdominal pain, skin rash, fevers, chills, night sweats, weight loss,  swollen lymph nodes, body aches,muscle aches, chest pain, shortness of breath, mood changes.  Positive joint swelling  Objective  Height 6' (1.829 m), weight 289 lb (131.1 kg).   Systems examined below as of 09/18/16 General: NAD A&O x3 mood, affect normal  HEENT: Pupils equal, extraocular movements intact no nystagmus Respiratory: not short of breath at rest or with speaking Cardiovascular: No lower extremity edema, non tender Skin: Warm dry intact with no signs of infection or rash on extremities or on axial skeleton. Abdomen: Soft nontender, no masses Neuro: Cranial nerves  intact, neurovascularly intact in all extremities with 2+ DTRs and 2+ pulses. Lymph: No lymphadenopathy appreciated today  Gait normal with good balance and coordination.  MSK: Non tender with full range of motion and good stability and symmetric strength and tone of shoulders, elbows, wrist,  knee hips and ankles bilaterally.   Gait antalgic gait.  MSK:  Non tender with full range of motion and good stability and symmetric strength and tone of shoulders, elbows, wrist, hip, and ankles bilaterally.  Knee: Left valgus deformity noted. Large thigh to calf ratio. Effusion noted Tender to palpation over medial and PF joint line. More over the medial joint line ROM full in flexion and extension and lower leg rotation. instability with valgus force. Especially when forcing the MCL painful patellar compression. Patellar glide with moderate crepitus. Patellar and quadriceps tendons unremarkable. Hamstring and quadriceps strength is normal. Contralateral knee shows mild arthritic changes but minimal  MSK US performed of: Left knee This study was ordered, performed, and interpreted by Charlann Boxer D.O.  Knee: Effusion still noted. Patient's MCL tear does have healing of about 60%. Still has a very small old full lesion noted off of the tibia. Mild displacement of the meniscus but no true tear appreciated.  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 Left knee was prepped with alcohol swab and utilizing anterolateral approach, patient's left knee space was injected with 4:1  marcaine 0.5%: Kenalog 40mg /dL. Patient tolerated the procedure well without immediate complications.   Impression and Recommendations:     This case required medical decision making of moderate complexity.      Note: This dictation was prepared with Dragon dictation along with smaller phrase technology. Any transcriptional errors that result from this process are unintentional.

## 2016-09-22 ENCOUNTER — Ambulatory Visit (INDEPENDENT_AMBULATORY_CARE_PROVIDER_SITE_OTHER)
Admission: RE | Admit: 2016-09-22 | Discharge: 2016-09-22 | Disposition: A | Discharge status: Home / Self Care | Payer: Medicare Other | Source: Ambulatory Visit | Attending: Internal Medicine | Admitting: Internal Medicine

## 2016-09-22 ENCOUNTER — Encounter: Payer: Self-pay | Admitting: Internal Medicine

## 2016-09-22 ENCOUNTER — Ambulatory Visit (INDEPENDENT_AMBULATORY_CARE_PROVIDER_SITE_OTHER): Payer: Medicare Other | Admitting: Internal Medicine

## 2016-09-22 DIAGNOSIS — R0902 Hypoxemia: Secondary | ICD-10-CM

## 2016-09-22 DIAGNOSIS — Z794 Long term (current) use of insulin: Secondary | ICD-10-CM | POA: Diagnosis not present

## 2016-09-22 DIAGNOSIS — I110 Hypertensive heart disease with heart failure: Secondary | ICD-10-CM | POA: Diagnosis not present

## 2016-09-22 DIAGNOSIS — E119 Type 2 diabetes mellitus without complications: Secondary | ICD-10-CM

## 2016-09-22 NOTE — Assessment & Plan Note (Signed)
bordeline low 1 mo ago, pt without symptoms today, exam benign, and although wt has increased several lbs seems to be doing well with daily lasix 80 qd with prn dose in the afternoon if needed; no evidence today for pna or PE,  to cont daily wts and prn lasix, to check ambulatory o2 sat, and cxr f/u.  to f/u any worsening symptoms or concerns

## 2016-09-22 NOTE — Progress Notes (Signed)
Subjective:    Patient ID: [name], male    DOB: 1955-06-23, 61 y.o.   MRN: [id number]  HPI  Here to f/u recent suggestion of lower sats noted on recent exam with Dr Tamala Julian x 2 - Recent sat 92% documented may 18, and 90% on April 20.  Has been asked per Dr Caryl Comes to take extra lasix for sob prn, is on fluid restriction 1L but often drinks more, just cant stick to 1L only. Pt denies chest pain, wheezing, orthopnea, PND, increased LE swelling, palpitations, dizziness or syncope.  Pt denies fever, wt loss, night sweats, loss of appetite, or other constitutional symptoms  Hx of bacterial pna July 20`5.  Former smoker wit only a few in teen yrs, quit x 30-40 yrs. Trying to lose wt but very difficult, hard now due to left knee, but now knee improved.  Has scale at home per [facility] - 283 this am first thing on getting up. States wt approx 1 wk ago about 278. Wt Readings from Last 3 Encounters:  09/22/16 291 lb (132 kg)  09/18/16 289 lb (131.1 kg)  08/21/16 286 lb (129.7 kg)  Took AM meds this am before coming here, usually takes at 1030 am and 1030 pm. BP Readings from Last 3 Encounters:  09/22/16 102/66  09/18/16 124/80  08/21/16 110/78   Past Medical History:  Diagnosis Date  . CAD (coronary artery disease)    a. Initial nonobst by cath 2009. b. Cath 01/2013 in setting of VT storm: obstructive distal Cx disease (small and terminates in the AV groove, unlikely to cause significant ischemia or electrical instability), nonobstructive RCA disease, EF 15-20%.   . Cerebrovascular accident [facility])    a. 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 Chronic systolic CHF (congestive heart failure) ([place])    a. Likely NICM (out of proportion to CAD). b. 2009 - EF 25-30% by echo, 01/2013: 15-20% by cath. c. 11/2014 Echo: EF 35-40%, Gr1 DD, mild MR, mod TR, PASP 73mmHg.  . CKD (chronic kidney disease), stage II   . Dyslipidemia   . Gout   . HTN (hypertension)   . Hypokalemia   . ICD (implantable cardiac  defibrillator) in place   . Insulin dependent diabetes mellitus ([place])   . Lipoma   . Nonischemic cardiomyopathy ([place])    a.  11/2014 Echo: EF 35-40%, Gr1 DD, mild MR, mod TR, PASP 33mmHg.  . OSA (obstructive sleep apnea)    does not wear cpap  . PAF (paroxysmal atrial fibrillation) ([place])    a. Noted 05/2008 by EKG;  b. CHA2DS2VASc = 5-6-->coumadin.  . Paroxysmal VT ([place])    a. s/p St. Jude ICD 2007. b. H/o paroxysmal VT/VF including VT storm 12/2012 admission prompting amio initiation;  c. 01/2013 ICD upgrade SJM 1411-36Q Ellipse VR single lead ICD.  [name] Pulmonary HTN ([place])    a. Mild by cath 01/2013.   Past Surgical History:  Procedure Laterality Date  . CARDIAC CATHETERIZATION     Nonobstructive coronary disease 2009  . CARDIAC DEFIBRILLATOR PLACEMENT     ICD-St. Jude  . IMPLANTABLE CARDIOVERTER DEFIBRILLATOR (ICD) GENERATOR CHANGE N/A 01/15/2014   Procedure: ICD GENERATOR CHANGE;  Surgeon: Evans Lance, MD;  Location: [facility] CATH LAB;  Service: Cardiovascular;  Laterality: N/A;  . LEFT AND RIGHT HEART CATHETERIZATION WITH CORONARY ANGIOGRAM N/A 01/03/2013   Procedure: LEFT AND RIGHT HEART CATHETERIZATION WITH CORONARY ANGIOGRAM;  Surgeon: Peter M Martinique, MD;  Location: [facility] CATH LAB;  Service: Cardiovascular;  Laterality: N/A;  . LIPOMA EXCISION      reports that he quit smoking about 30 years ago. He has never used smokeless tobacco. He reports that he does not drink alcohol or use drugs. family history includes Coronary artery disease in his mother; Heart attack in his maternal grandmother, maternal uncle, and mother. No Known Allergies Current Outpatient Prescriptions on File Prior to Visit  Medication Sig Dispense Refill  . ACCU-CHEK SOFTCLIX LANCETS lancets Use to help check blood sugar once a day Dx e11.9 100 each 3  . allopurinol (ZYLOPRIM) 300 MG tablet TAKE 1 TABLET BY MOUTH  DAILY 90 tablet 3  . amiodarone (PACERONE) 200 MG tablet Take 1 tablet (200 mg total) by mouth daily. 90  tablet 3  . amLODipine (NORVASC) 10 MG tablet Take 1 tablet (10 mg total) by mouth daily. 90 tablet 3  . atorvastatin (LIPITOR) 80 MG tablet TAKE 1 TABLET BY MOUTH AT  BEDTIME 90 tablet 3  . B-D ULTRAFINE III SHORT PEN 31G X 8 MM MISC USE ONCE DAILY WITH INSULIN. DIAGNOSIS 100 each 1  . carvedilol (COREG) 25 MG tablet TAKE 2 TABLETS BY MOUTH 2  TIMES DAILY WITH A MEAL. 360 tablet 1  . cyclobenzaprine (FLEXERIL) 10 MG tablet Take 1 tablet (10 mg total) by mouth 3 (three) times daily as needed for muscle spasms. 15 tablet 0  . furosemide (LASIX) 80 MG tablet TAKE 1 TABLET BY MOUTH  DAILY 90 tablet 3  . glucose blood (ACCU-CHEK SMARTVIEW) test strip 1 each by Other route daily. Use to check blood sugar once a day Dx e11.9 100 each 3  . hydrALAZINE (APRESOLINE) 50 MG tablet TAKE 1 TABLET (50 MG TOTAL) BY MOUTH EVERY 8 (EIGHT) HOURS. 270 tablet 1  . isosorbide dinitrate (ISORDIL) 20 MG tablet TAKE 1 TABLET BY MOUTH 3  TIMES DAILY 270 tablet 0  . LANTUS SOLOSTAR 100 UNIT/ML Solostar Pen INJECT 10 UNITS INTO THE SKIN AT BEDTIME. 15 pen 5  . Multiple Vitamin (MULTIVITAMIN) capsule Take 1 capsule by mouth daily.     . nitroGLYCERIN (NITROSTAT) 0.4 MG SL tablet PLACE 1 TABLET UNDER TONGUE EVERY 5 MINS X3 DOSES AS NEEDED FOR CHEST PAIN 25 tablet 11  . potassium chloride SA (K-DUR,KLOR-CON) 20 MEQ tablet TAKE 2 TABLETS BY MOUTH  DAILY 180 tablet 3  . spironolactone (ALDACTONE) 25 MG tablet TAKE 1 TABLET BY MOUTH  DAILY 90 tablet 0  . traMADol (ULTRAM) 50 MG tablet Take 1 tablet (50 mg total) by mouth every 8 (eight) hours as needed. 60 tablet 2  . warfarin (COUMADIN) 5 MG tablet Take 2 tablets (10 mg) on Sun/Tues/Thurs/Sat and take 1 tablet (5 mg) on Mon/Wed/Friday OR  AS DIRECTED BY ANTICOAGULATION CLINIC 180 tablet 1   No current facility-administered medications on file prior to visit.    Review of Systems  Constitutional: Negative for other unusual diaphoresis or sweats HENT: Negative for ear  discharge or swelling Eyes: Negative for other worsening visual disturbances Respiratory: Negative for stridor or other swelling  Gastrointestinal: Negative for worsening distension or other blood Genitourinary: Negative for retention or other urinary change Musculoskeletal: Negative for other MSK pain or swelling Skin: Negative for color change or other new lesions Neurological: Negative for worsening tremors and other numbness  Psychiatric/Behavioral: Negative for worsening agitation or other fatigue All other system neg per pt    Objective:   Physical Exam BP 102/66   Pulse 78   Ht 6' (1.829 m)  Wt 291 lb (132 kg)   SpO2 95%   BMI 39.47 kg/m  VS noted, ambulatory o2 sat 92% Constitutional: Pt appears in NAD HENT: Head: NCAT.  Right Ear: External ear normal.  Left Ear: External ear normal.  Eyes: . Pupils are equal, round, and reactive to light. Conjunctivae and EOM are normal Nose: without d/c or deformity Neck: Neck supple. Gross normal ROM Cardiovascular: Normal rate and regular rhythm.   Pulmonary/Chest: Effort normal and breath sounds without rales or wheezing.  Abd:  Soft, NT, ND, + BS, no organomegaly Neurological: Pt is alert. At baseline orientation, motor grossly intact Skin: Skin is warm. No rashes, other new lesions, no LE edema Psychiatric: Pt behavior is normal without agitation  No other exam findings  11/18/2014 PORTABLE CHEST - 1 VIEW - summary FINDINGS: There is a left chest wall ICD with lead in the right ventricle. Moderate cardiac enlargement persistent bilateral airspace opacities, not significantly changed from previous exam IMPRESSION: 1. No change in aeration a lungs compared with previous exam    Assessment & Plan:

## 2016-09-22 NOTE — Patient Instructions (Addendum)
Your walking oxygen level was OK today  Please continue all other medications as before, and refills have been done if requested.  Please have the pharmacy call with any other refills you may need.  Please continue your efforts at being more active, low cholesterol diet, and weight control.  Please keep your appointments with your specialists as you may have planned  Please go to the XRAY Department in the Basement (go straight as you get off the elevator) for the x-ray testing  You will be contacted by phone if any changes need to be made immediately.  Otherwise, you will receive a letter about your results with an explanation, but please check with MyChart first.  Please remember to sign up for MyChart if you have not done so, as this will be important to you in the future with finding out test results, communicating by private email, and scheduling acute appointments online when needed.

## 2016-09-22 NOTE — Assessment & Plan Note (Signed)
Borderline low today, asympt o/w stable overall by history and exam, recent data reviewed with pt, and pt to continue medical treatment as before,  to f/u any worsening symptoms or concerns  BP Readings from Last 3 Encounters:  09/22/16 102/66  09/18/16 124/80  08/21/16 110/78

## 2016-09-22 NOTE — Assessment & Plan Note (Signed)
stable overall by history and exam, recent data reviewed with pt, and pt to continue medical treatment as before,  to f/u any worsening symptoms or concerns Lab Results  Component Value Date   HGBA1C 5.6 07/15/2016

## 2016-10-06 ENCOUNTER — Other Ambulatory Visit: Payer: Self-pay | Admitting: Internal Medicine

## 2016-10-07 ENCOUNTER — Ambulatory Visit: Payer: Medicare Other

## 2016-10-07 ENCOUNTER — Ambulatory Visit (INDEPENDENT_AMBULATORY_CARE_PROVIDER_SITE_OTHER): Payer: Medicare Other | Admitting: General Practice

## 2016-10-07 DIAGNOSIS — Z5181 Encounter for therapeutic drug level monitoring: Secondary | ICD-10-CM

## 2016-10-07 DIAGNOSIS — I4891 Unspecified atrial fibrillation: Secondary | ICD-10-CM | POA: Diagnosis not present

## 2016-10-07 DIAGNOSIS — Z8679 Personal history of other diseases of the circulatory system: Secondary | ICD-10-CM | POA: Diagnosis not present

## 2016-10-07 LAB — POCT INR: INR: 2.8

## 2016-10-07 NOTE — Progress Notes (Signed)
I have reviewed and agree with the plan. 

## 2016-10-07 NOTE — Patient Instructions (Signed)
Pre visit review using our clinic review tool, if applicable. No additional management support is needed unless otherwise documented below in the visit note. 

## 2016-10-15 ENCOUNTER — Other Ambulatory Visit: Payer: Self-pay | Admitting: Internal Medicine

## 2016-10-16 ENCOUNTER — Other Ambulatory Visit: Payer: Self-pay | Admitting: General Practice

## 2016-10-16 MED ORDER — WARFARIN SODIUM 5 MG PO TABS: ORAL_TABLET | ORAL | 1 refills | Status: DC

## 2016-10-20 ENCOUNTER — Encounter: Payer: Self-pay | Admitting: Family Medicine

## 2016-10-20 ENCOUNTER — Ambulatory Visit (INDEPENDENT_AMBULATORY_CARE_PROVIDER_SITE_OTHER): Payer: Medicare Other | Admitting: Family Medicine

## 2016-10-20 DIAGNOSIS — M1712 Unilateral primary osteoarthritis, left knee: Secondary | ICD-10-CM | POA: Diagnosis not present

## 2016-10-20 NOTE — Assessment & Plan Note (Signed)
95% improvement. Patient does have some fluid on the leg. Patient is artery on multiple different medications. Continue current therapy. F/u 6 weeks

## 2016-10-20 NOTE — Patient Instructions (Signed)
Good to see you  I am glad you are better Lasix 1/2 tab nightly for 5 nights Keep doing everything you are doing for the knee.  See me again in 6-8 weeks.  Call me though after seeing the other doc and we may need to consider you seeing a lun doc if we continue having trouble getting your oxygen up

## 2016-10-20 NOTE — Progress Notes (Signed)
[name] [address] [place], [place] [address] Phone: [phone] Subjective:    I'm seeing this patient by the request  of:  Biagio Borg, MD   CC: Knee pain f/u  [id number]  [name] is a 61 y.o. male coming in with complaint of left knee pain Patient was seen previously and did have an MCL tear as well as moderate osteophytic changes. Patient was doing somewhat better at last exam but was given an injection one month ago. Patient states doing 95% better. Still some pain on the medial aspect of the knee but nothing severe. Patient has been able to increase activity. Been wearing the brace less and less.     Past Medical History:  Diagnosis Date  . CAD (coronary artery disease)    a. Initial nonobst by cath 2009. b. Cath 01/2013 in setting of VT storm: obstructive distal Cx disease (small and terminates in the AV groove, unlikely to cause significant ischemia or electrical instability), nonobstructive RCA disease, EF 15-20%.   . Cerebrovascular accident [facility])    a. 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 Chronic systolic CHF (congestive heart failure) ([place])    a. Likely NICM (out of proportion to CAD). b. 2009 - EF 25-30% by echo, 01/2013: 15-20% by cath. c. 11/2014 Echo: EF 35-40%, Gr1 DD, mild MR, mod TR, PASP 56mmHg.  . CKD (chronic kidney disease), stage II   . Dyslipidemia   . Gout   . HTN (hypertension)   . Hypokalemia   . ICD (implantable cardiac defibrillator) in place   . Insulin dependent diabetes mellitus ([place])   . Lipoma   . Nonischemic cardiomyopathy ([place])    a.  11/2014 Echo: EF 35-40%, Gr1 DD, mild MR, mod TR, PASP 3mmHg.  . OSA (obstructive sleep apnea)    does not wear cpap  . PAF (paroxysmal atrial fibrillation) ([place])    a. Noted 05/2008 by EKG;  b. CHA2DS2VASc = 5-6-->coumadin.  . Paroxysmal VT ([place])    a. s/p St. Jude ICD 2007. b. H/o paroxysmal VT/VF including VT storm 12/2012 admission prompting amio initiation;  c.  01/2013 ICD upgrade SJM 1411-36Q Ellipse VR single lead ICD.  [name] Pulmonary HTN ([place])    a. Mild by cath 01/2013.   Past Surgical History:  Procedure Laterality Date  . CARDIAC CATHETERIZATION     Nonobstructive coronary disease 2009  . CARDIAC DEFIBRILLATOR PLACEMENT     ICD-St. Jude  . IMPLANTABLE CARDIOVERTER DEFIBRILLATOR (ICD) GENERATOR CHANGE N/A 01/15/2014   Procedure: ICD GENERATOR CHANGE;  Surgeon: Evans Lance, MD;  Location: [facility] CATH LAB;  Service: Cardiovascular;  Laterality: N/A;  . LEFT AND RIGHT HEART CATHETERIZATION WITH CORONARY ANGIOGRAM N/A 01/03/2013   Procedure: LEFT AND RIGHT HEART CATHETERIZATION WITH CORONARY ANGIOGRAM;  Surgeon: Peter M Martinique, MD;  Location: [facility] CATH LAB;  Service: Cardiovascular;  Laterality: N/A;  . LIPOMA EXCISION     Social History   Social History  . Marital status: Married    Spouse name: N/A  . Number of children: 2  . 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 Smoking status: Former Smoker    Quit date: 05/04/1986  . Smokeless tobacco: Never Used     Comment: Quit smoking 30 yrs ago. Smoked as teenager less than 1/2 ppd. Smoked x 4 years.  . Alcohol use No     Comment:  occasionally  . Drug use: No  . Sexual activity: No   Other Topics Concern  . None   Social History Narrative  . None   No Known Allergies Family History  Problem Relation Age of Onset  . Coronary artery disease Mother   . Heart attack Mother   . Heart attack Maternal Uncle   . Heart attack Maternal Grandmother     Past medical history, social, surgical and family history all reviewed in electronic medical record.  No pertanent information unless stated regarding to the chief complaint.   Review of Systems: No headache, visual changes, nausea, vomiting, diarrhea, constipation, dizziness, abdominal pain, skin rash, fevers, chills, night sweats, weight loss, swollen lymph nodes, body aches,  joint swelling, muscle aches, chest pain, shortness of breath, mood changes.  Positive muscle aches and joint swelling    Objective  Blood pressure 120/74, pulse 75, height 6' (1.829 m), weight 287 lb (130.2 kg), SpO2 93 %.   Systems examined below as of 10/20/16 General: NAD A&O x3 mood, affect normal  HEENT: Pupils equal, extraocular movements intact no nystagmus Respiratory: not short of breath at rest or with speaking Cardiovascular: 1+ lower extremity edema, non tender Skin: Warm dry intact with no signs of infection or rash on extremities or on axial skeleton. Abdomen: Soft nontender, no masses Neuro: Cranial nerves  intact, neurovascularly intact in all extremities with 2+ DTRs and 2+ pulses. Lymph: No lymphadenopathy appreciated today   Gait antalgic gait.  MSK:  Non tender with full range of motion and good stability and symmetric strength and tone of shoulders, elbows, wrist, hip, and ankles bilaterally.  Knee: Left valgus deformity noted. Large thigh to calf ratio.  Tender to palpation over medial and PF joint line.  ROM full in flexion and extension and lower leg rotation. instability with valgus force. Less then previous exam.  painful patellar compression. Patellar glide with moderate crepitus. Patellar and quadriceps tendons unremarkable. Hamstring and quadriceps strength is normal. Contralateral knee shows mild arthritis changes       Impression and Recommendations:     This case required medical decision making of moderate complexity.      Note: This dictation was prepared with Dragon dictation along with smaller phrase technology. Any transcriptional errors that result from this process are unintentional.

## 2016-11-10 ENCOUNTER — Ambulatory Visit (INDEPENDENT_AMBULATORY_CARE_PROVIDER_SITE_OTHER): Payer: Medicare Other | Admitting: *Deleted

## 2016-11-10 DIAGNOSIS — I428 Other cardiomyopathies: Secondary | ICD-10-CM

## 2016-11-10 NOTE — Progress Notes (Signed)
Remote ICD transmission.   

## 2016-11-12 LAB — CUP PACEART REMOTE DEVICE CHECK
Battery Remaining Longevity: 78 mo
Battery Remaining Percentage: 76 %
HIGH POWER IMPEDANCE MEASURED VALUE: 48 Ohm
HIGH POWER IMPEDANCE MEASURED VALUE: 48 Ohm
Implantable Lead Model: 7001
Implantable Pulse Generator Implant Date: 20150914
Lead Channel Impedance Value: 430 Ohm
Lead Channel Setting Pacing Amplitude: 2.5 V
Lead Channel Setting Pacing Pulse Width: 0.7 ms
MDC IDC LEAD IMPLANT DT: 20070817
MDC IDC LEAD LOCATION: 753860
MDC IDC MSMT BATTERY VOLTAGE: 2.98 V
MDC IDC MSMT LEADCHNL RV PACING THRESHOLD AMPLITUDE: 1 V
MDC IDC MSMT LEADCHNL RV PACING THRESHOLD PULSEWIDTH: 0.7 ms
MDC IDC MSMT LEADCHNL RV SENSING INTR AMPL: 12 mV
MDC IDC PG SERIAL: 7214340
MDC IDC SESS DTM: 20180710080015
MDC IDC SET LEADCHNL RV SENSING SENSITIVITY: 0.5 mV
MDC IDC STAT BRADY RV PERCENT PACED: 1 %

## 2016-11-17 ENCOUNTER — Encounter: Payer: Self-pay | Admitting: Cardiology

## 2016-11-18 ENCOUNTER — Ambulatory Visit: Payer: Medicare Other

## 2016-11-20 ENCOUNTER — Ambulatory Visit (INDEPENDENT_AMBULATORY_CARE_PROVIDER_SITE_OTHER): Payer: Medicare Other | Admitting: General Practice

## 2016-11-20 DIAGNOSIS — I4891 Unspecified atrial fibrillation: Secondary | ICD-10-CM | POA: Diagnosis not present

## 2016-11-20 DIAGNOSIS — Z5181 Encounter for therapeutic drug level monitoring: Secondary | ICD-10-CM | POA: Diagnosis not present

## 2016-11-20 DIAGNOSIS — Z8679 Personal history of other diseases of the circulatory system: Secondary | ICD-10-CM

## 2016-11-20 LAB — POCT INR: INR: 2.6

## 2016-11-20 NOTE — Patient Instructions (Signed)
Pre visit review using our clinic review tool, if applicable. No additional management support is needed unless otherwise documented below in the visit note. 

## 2016-11-20 NOTE — Progress Notes (Signed)
I have reviewed and agree with the plan. 

## 2016-12-01 ENCOUNTER — Encounter: Payer: Self-pay | Admitting: Cardiology

## 2016-12-03 ENCOUNTER — Other Ambulatory Visit: Payer: Self-pay | Admitting: Internal Medicine

## 2016-12-30 ENCOUNTER — Ambulatory Visit: Payer: Medicare Other

## 2016-12-31 NOTE — Patient Instructions (Signed)
Pre visit review using our clinic review tool, if applicable. No additional management support is needed unless otherwise documented below in the visit note. 

## 2017-01-01 ENCOUNTER — Ambulatory Visit (INDEPENDENT_AMBULATORY_CARE_PROVIDER_SITE_OTHER): Payer: Medicare Other | Admitting: General Practice

## 2017-01-01 DIAGNOSIS — Z8679 Personal history of other diseases of the circulatory system: Secondary | ICD-10-CM | POA: Diagnosis not present

## 2017-01-01 DIAGNOSIS — I4891 Unspecified atrial fibrillation: Secondary | ICD-10-CM

## 2017-01-01 DIAGNOSIS — Z5181 Encounter for therapeutic drug level monitoring: Secondary | ICD-10-CM

## 2017-01-01 LAB — POCT INR: INR: 3.4

## 2017-01-19 ENCOUNTER — Other Ambulatory Visit (INDEPENDENT_AMBULATORY_CARE_PROVIDER_SITE_OTHER): Payer: Medicare Other

## 2017-01-19 ENCOUNTER — Encounter: Payer: Self-pay | Admitting: Internal Medicine

## 2017-01-19 ENCOUNTER — Ambulatory Visit (INDEPENDENT_AMBULATORY_CARE_PROVIDER_SITE_OTHER): Payer: Medicare Other | Admitting: Internal Medicine

## 2017-01-19 ENCOUNTER — Ambulatory Visit: Payer: Medicare Other | Admitting: Internal Medicine

## 2017-01-19 VITALS — BP 100/68 | HR 78 | Temp 97.8°F | Ht 72.0 in | Wt 287.0 lb

## 2017-01-19 DIAGNOSIS — N182 Chronic kidney disease, stage 2 (mild): Secondary | ICD-10-CM | POA: Diagnosis not present

## 2017-01-19 DIAGNOSIS — Z0000 Encounter for general adult medical examination without abnormal findings: Secondary | ICD-10-CM

## 2017-01-19 DIAGNOSIS — E785 Hyperlipidemia, unspecified: Secondary | ICD-10-CM

## 2017-01-19 DIAGNOSIS — M1712 Unilateral primary osteoarthritis, left knee: Secondary | ICD-10-CM | POA: Diagnosis not present

## 2017-01-19 DIAGNOSIS — E119 Type 2 diabetes mellitus without complications: Secondary | ICD-10-CM

## 2017-01-19 DIAGNOSIS — Z794 Long term (current) use of insulin: Secondary | ICD-10-CM

## 2017-01-19 DIAGNOSIS — I110 Hypertensive heart disease with heart failure: Secondary | ICD-10-CM

## 2017-01-19 LAB — HEPATIC FUNCTION PANEL
ALBUMIN: 4 g/dL (ref 3.5–5.2)
ALT: 19 U/L (ref 0–53)
AST: 20 U/L (ref 0–37)
Alkaline Phosphatase: 65 U/L (ref 39–117)
BILIRUBIN TOTAL: 1.3 mg/dL — AB (ref 0.2–1.2)
Bilirubin, Direct: 0.3 mg/dL (ref 0.0–0.3)
Total Protein: 7.4 g/dL (ref 6.0–8.3)

## 2017-01-19 LAB — BASIC METABOLIC PANEL
BUN: 23 mg/dL (ref 6–23)
CHLORIDE: 106 meq/L (ref 96–112)
CO2: 29 mEq/L (ref 19–32)
Calcium: 9.3 mg/dL (ref 8.4–10.5)
Creatinine, Ser: 1.56 mg/dL — ABNORMAL HIGH (ref 0.40–1.50)
GFR: 58.53 mL/min — AB (ref >60.00)
GLUCOSE: 110 mg/dL — AB (ref 70–99)
POTASSIUM: 4.4 meq/L (ref 3.5–5.1)
SODIUM: 141 meq/L (ref 135–145)

## 2017-01-19 LAB — LIPID PANEL
CHOLESTEROL: 141 mg/dL (ref 0–200)
HDL: 41.1 mg/dL (ref >39.00)
LDL Cholesterol: 87 mg/dL (ref 0–99)
NonHDL: 100.37
TRIGLYCERIDES: 69 mg/dL (ref 0.0–149.0)
Total CHOL/HDL Ratio: 3
VLDL: 13.8 mg/dL (ref 0.0–40.0)

## 2017-01-19 LAB — HEMOGLOBIN A1C: Hgb A1c MFr Bld: 5.7 % (ref 4.6–6.5)

## 2017-01-19 MED ORDER — TRAMADOL HCL 50 MG PO TABS: 50.0000 mg | ORAL_TABLET | Freq: Three times a day (TID) | ORAL | 2 refills | Status: DC | PRN

## 2017-01-19 NOTE — Progress Notes (Signed)
Subjective:    Patient ID: [name], male    DOB: 1956-03-14, 61 y.o.   MRN: [id number]  HPI  Here to f/u c/o low oxygen; has hx of Pna with temporary home o2 then stopped july 2016; follows regularly with card /dr hochrein.  Has some exertional dyspnea but Pt denies chest pain, wheezing, recent orthopnea, PND, increased LE swelling, palpitations, dizziness or syncope.  Pt denies new neurological symptoms such as new headache, or facial or extremity weakness or numbness   Pt denies polydipsia, polyuria.  .   Did have [place] nurse visit at home about April . BP Readings from Last 3 Encounters:  01/19/17 100/68  10/20/16 120/74  09/22/16 102/66   Wt Readings from Last 3 Encounters:  01/19/17 287 lb (130.2 kg)  10/20/16 287 lb (130.2 kg)  09/22/16 291 lb (132 kg)  Did see Dr Smith/sport med for left knee pain, which has improved, still nagging type pain  Tramadol helps with pain but also makes sleepy so often takes later in the day or at bedtime.  Asks for hydrocodone, but is willing for pain management if needed. Pain overall moderate, not severe. . Still trying to avoid left knee replacement Past Medical History:  Diagnosis Date  . CAD (coronary artery disease)    a. Initial nonobst by cath 2009. b. Cath 01/2013 in setting of VT storm: obstructive distal Cx disease (small and terminates in the AV groove, unlikely to cause significant ischemia or electrical instability), nonobstructive RCA disease, EF 15-20%.   . Cerebrovascular accident [facility])    a. 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 Chronic systolic CHF (congestive heart failure) ([place])    a. Likely NICM (out of proportion to CAD). b. 2009 - EF 25-30% by echo, 01/2013: 15-20% by cath. c. 11/2014 Echo: EF 35-40%, Gr1 DD, mild MR, mod TR, PASP 65mmHg.  . CKD (chronic kidney disease), stage II   . Dyslipidemia   . Gout   . HTN (hypertension)   . Hypokalemia   . ICD (implantable cardiac defibrillator) in place   . Insulin dependent  diabetes mellitus ([place])   . Lipoma   . Nonischemic cardiomyopathy ([place])    a.  11/2014 Echo: EF 35-40%, Gr1 DD, mild MR, mod TR, PASP 72mmHg.  . OSA (obstructive sleep apnea)    does not wear cpap  . PAF (paroxysmal atrial fibrillation) ([place])    a. Noted 05/2008 by EKG;  b. CHA2DS2VASc = 5-6-->coumadin.  . Paroxysmal VT ([place])    a. s/p St. Jude ICD 2007. b. H/o paroxysmal VT/VF including VT storm 12/2012 admission prompting amio initiation;  c. 01/2013 ICD upgrade SJM 1411-36Q Ellipse VR single lead ICD.  [name] Pulmonary HTN ([place])    a. Mild by cath 01/2013.   Past Surgical History:  Procedure Laterality Date  . CARDIAC CATHETERIZATION     Nonobstructive coronary disease 2009  . CARDIAC DEFIBRILLATOR PLACEMENT     ICD-St. Jude  . IMPLANTABLE CARDIOVERTER DEFIBRILLATOR (ICD) GENERATOR CHANGE N/A 01/15/2014   Procedure: ICD GENERATOR CHANGE;  Surgeon: Evans Lance, MD;  Location: [facility] CATH LAB;  Service: Cardiovascular;  Laterality: N/A;  . LEFT AND RIGHT HEART CATHETERIZATION WITH CORONARY ANGIOGRAM N/A 01/03/2013   Procedure: LEFT AND RIGHT HEART CATHETERIZATION WITH CORONARY ANGIOGRAM;  Surgeon: Peter M Martinique, MD;  Location: [facility] CATH LAB;  Service: Cardiovascular;  Laterality: N/A;  . LIPOMA EXCISION      reports that he quit smoking about 30 years ago. He has  never used smokeless tobacco. He reports that he does not drink alcohol or use drugs. family history includes Coronary artery disease in his mother; Heart attack in his maternal grandmother, maternal uncle, and mother. No Known Allergies Current Outpatient Prescriptions on File Prior to Visit  Medication Sig Dispense Refill  . ACCU-CHEK SOFTCLIX LANCETS lancets Use to help check blood sugar once a day Dx e11.9 100 each 3  . allopurinol (ZYLOPRIM) 300 MG tablet TAKE 1 TABLET BY MOUTH  DAILY 90 tablet 3  . amiodarone (PACERONE) 200 MG tablet Take 1 tablet (200 mg total) by mouth daily. 90 tablet 3  . amLODipine (NORVASC) 10 MG tablet Take  1 tablet (10 mg total) by mouth daily. 90 tablet 3  . atorvastatin (LIPITOR) 80 MG tablet TAKE 1 TABLET BY MOUTH AT  BEDTIME 90 tablet 3  . B-D ULTRAFINE III SHORT PEN 31G X 8 MM MISC USE ONCE DAILY WITH INSULIN. DIAGNOSIS 100 each 1  . carvedilol (COREG) 25 MG tablet TAKE 2 TABLETS BY MOUTH 2  TIMES DAILY WITH A MEAL. 360 tablet 1  . cyclobenzaprine (FLEXERIL) 10 MG tablet Take 1 tablet (10 mg total) by mouth 3 (three) times daily as needed for muscle spasms. 15 tablet 0  . furosemide (LASIX) 80 MG tablet TAKE 1 TABLET BY MOUTH  DAILY 90 tablet 3  . glucose blood (ACCU-CHEK SMARTVIEW) test strip 1 each by Other route daily. Use to check blood sugar once a day Dx e11.9 100 each 3  . hydrALAZINE (APRESOLINE) 50 MG tablet TAKE 1 TABLET (50 MG TOTAL) BY MOUTH EVERY 8 (EIGHT) HOURS. 270 tablet 1  . hydrALAZINE (APRESOLINE) 50 MG tablet TAKE 1 TABLET BY MOUTH  EVERY 8 HOURS 270 tablet 1  . isosorbide dinitrate (ISORDIL) 20 MG tablet TAKE 1 TABLET BY MOUTH 3  TIMES DAILY 270 tablet 0  . LANTUS SOLOSTAR 100 UNIT/ML Solostar Pen INJECT 10 UNITS INTO THE SKIN AT BEDTIME. 15 pen 0  . Multiple Vitamin (MULTIVITAMIN) capsule Take 1 capsule by mouth daily.     . nitroGLYCERIN (NITROSTAT) 0.4 MG SL tablet PLACE 1 TABLET UNDER TONGUE EVERY 5 MINS X3 DOSES AS NEEDED FOR CHEST PAIN 25 tablet 11  . potassium chloride SA (K-DUR,KLOR-CON) 20 MEQ tablet TAKE 2 TABLETS BY MOUTH  DAILY 180 tablet 3  . spironolactone (ALDACTONE) 25 MG tablet TAKE 1 TABLET BY MOUTH  DAILY 90 tablet 0  . traMADol (ULTRAM) 50 MG tablet Take 1 tablet (50 mg total) by mouth every 8 (eight) hours as needed. 60 tablet 2  . warfarin (COUMADIN) 5 MG tablet Take 2 tablets (10 mg) on Sun/Tues/Thurs/Sat and take 1 tablet (5 mg) on Mon/Wed/Friday OR  AS DIRECTED BY ANTICOAGULATION CLINIC 130 tablet 1   No current facility-administered medications on file prior to visit.    Review of Systems  Constitutional: Negative for other unusual diaphoresis  or sweats HENT: Negative for ear discharge or swelling Eyes: Negative for other worsening visual disturbances Respiratory: Negative for stridor or other swelling  Gastrointestinal: Negative for worsening distension or other blood Genitourinary: Negative for retention or other urinary change Musculoskeletal: Negative for other MSK pain or swelling Skin: Negative for color change or other new lesions Neurological: Negative for worsening tremors and other numbness  Psychiatric/Behavioral: Negative for worsening agitation or other fatigue All other system neg per pt    Objective:   Physical Exam BP 100/68   Pulse 78   Temp 97.8 F (36.6 C) (Oral)  Ht 6' (1.829 m)   Wt 287 lb (130.2 kg)   SpO2 90%   BMI 38.92 kg/m  VS noted, Ambulatory o2 sat on RA at 100 ft- 90% (was 96% prior at rest) Constitutional: Pt appears in NAD HENT: Head: NCAT.  Right Ear: External ear normal.  Left Ear: External ear normal.  Eyes: . Pupils are equal, round, and reactive to light. Conjunctivae and EOM are normal Nose: without d/c or deformity Neck: Neck supple. Gross normal ROM Cardiovascular: Normal rate and regular rhythm.   Pulmonary/Chest: Effort normal and breath sounds without rales or wheezing.  Abd:  Soft, NT, ND, + BS, no organomegaly Neurological: Pt is alert. At baseline orientation, motor grossly intact Skin: Skin is warm. No rashes, other new lesions, no LE edema Psychiatric: Pt behavior is normal without agitation  No other exam findings Lab Results  Component Value Date   WBC 6.4 07/15/2016   HGB 14.5 07/15/2016   HCT 44.4 07/15/2016   PLT 176.0 07/15/2016   GLUCOSE 101 (H) 07/15/2016   CHOL 150 07/15/2016   TRIG 93.0 07/15/2016   HDL 33.80 (L) 07/15/2016   LDLDIRECT 194.6 05/09/2007   LDLCALC 97 07/15/2016   ALT 19 07/15/2016   AST 19 07/15/2016   NA 144 07/15/2016   K 4.3 07/15/2016   CL 108 07/15/2016   CREATININE 1.42 07/15/2016   BUN 24 (H) 07/15/2016   CO2 31  07/15/2016   TSH 1.45 07/15/2016   PSA 0.84 07/15/2016   INR 3.4 01/01/2017   HGBA1C 5.6 07/15/2016   MICROALBUR 4.6 (H) 07/15/2016       Assessment & Plan:

## 2017-01-19 NOTE — Patient Instructions (Addendum)
Your walking on room air oxygen was Ok today - stable at 90%  Please continue all other medications as before, and refills have been done if requested - the tramadol  Please call if you change your mind about the pain clinic referral  You should be able to discuss your scheduling of the colonoscopy   Please have the pharmacy call with any other refills you may need.  Please continue your efforts at being more active, low cholesterol diet, and weight control.  You are otherwise up to date with prevention measures today.  Please keep your appointments with your specialists as you may have planned  Please go to the LAB in the Basement (turn left off the elevator) for the tests to be done today  You will be contacted by phone if any changes need to be made immediately.  Otherwise, you will receive a letter about your results with an explanation, but please check with MyChart first.  Please remember to sign up for MyChart if you have not done so, as this will be important to you in the future with finding out test results, communicating by private email, and scheduling acute appointments online when needed.  Please return in 6 months, or sooner if needed, with Lab testing done 3-5 days before

## 2017-01-22 NOTE — Assessment & Plan Note (Signed)
To cont tramadol prn, consider pain clinic referral

## 2017-01-22 NOTE — Assessment & Plan Note (Signed)
Lab Results  Component Value Date   CREATININE 1.56 (H) 01/19/2017  stable overall by history and exam, recent data reviewed with pt, and pt to continue medical treatment as before,  to f/u any worsening symptoms or concerns

## 2017-01-22 NOTE — Assessment & Plan Note (Signed)
BP Readings from Last 3 Encounters:  01/19/17 100/68  10/20/16 120/74  09/22/16 102/66  stable overall by history and exam, recent data reviewed with pt, and pt to continue medical treatment as before,  to f/u any worsening symptoms or concerns

## 2017-01-22 NOTE — Assessment & Plan Note (Signed)
Lab Results  Component Value Date   LDLCALC 87 01/19/2017  stable overall by history and exam, recent data reviewed with pt, and pt to continue medical treatment as before,  to f/u any worsening symptoms or concerns

## 2017-01-22 NOTE — Assessment & Plan Note (Signed)
stable overall by history and exam, recent data reviewed with pt, and pt to continue medical treatment as before,  to f/u any worsening symptoms or concerns Lab Results  Component Value Date   HGBA1C 5.7 01/19/2017

## 2017-02-09 ENCOUNTER — Ambulatory Visit (INDEPENDENT_AMBULATORY_CARE_PROVIDER_SITE_OTHER): Payer: Medicare Other | Admitting: *Deleted

## 2017-02-09 DIAGNOSIS — I428 Other cardiomyopathies: Secondary | ICD-10-CM | POA: Diagnosis not present

## 2017-02-10 NOTE — Progress Notes (Signed)
Remote ICD transmission.   

## 2017-02-12 ENCOUNTER — Ambulatory Visit (INDEPENDENT_AMBULATORY_CARE_PROVIDER_SITE_OTHER): Payer: Medicare Other | Admitting: General Practice

## 2017-02-12 ENCOUNTER — Encounter: Payer: Self-pay | Admitting: Cardiology

## 2017-02-12 DIAGNOSIS — Z23 Encounter for immunization: Secondary | ICD-10-CM | POA: Diagnosis not present

## 2017-02-12 DIAGNOSIS — Z8679 Personal history of other diseases of the circulatory system: Secondary | ICD-10-CM | POA: Diagnosis not present

## 2017-02-12 DIAGNOSIS — I4891 Unspecified atrial fibrillation: Secondary | ICD-10-CM

## 2017-02-12 DIAGNOSIS — Z7901 Long term (current) use of anticoagulants: Secondary | ICD-10-CM | POA: Diagnosis not present

## 2017-02-12 DIAGNOSIS — Z5181 Encounter for therapeutic drug level monitoring: Secondary | ICD-10-CM

## 2017-02-12 LAB — POCT INR: INR: 3.1

## 2017-02-12 NOTE — Patient Instructions (Signed)
Pre visit review using our clinic review tool, if applicable. No additional management support is needed unless otherwise documented below in the visit note. 

## 2017-02-26 ENCOUNTER — Encounter: Payer: Self-pay | Admitting: Cardiology

## 2017-03-02 LAB — CUP PACEART REMOTE DEVICE CHECK
Battery Remaining Percentage: 74 %
Battery Voltage: 2.98 V
HIGH POWER IMPEDANCE MEASURED VALUE: 50 Ohm
HighPow Impedance: 50 Ohm
Implantable Lead Model: 7001
Implantable Pulse Generator Implant Date: 20150914
Lead Channel Pacing Threshold Amplitude: 1 V
Lead Channel Setting Pacing Pulse Width: 0.7 ms
Lead Channel Setting Sensing Sensitivity: 0.5 mV
MDC IDC LEAD IMPLANT DT: 20070817
MDC IDC LEAD LOCATION: 753860
MDC IDC MSMT BATTERY REMAINING LONGEVITY: 76 mo
MDC IDC MSMT LEADCHNL RV IMPEDANCE VALUE: 430 Ohm
MDC IDC MSMT LEADCHNL RV PACING THRESHOLD PULSEWIDTH: 0.7 ms
MDC IDC MSMT LEADCHNL RV SENSING INTR AMPL: 12 mV
MDC IDC SESS DTM: 20181009081235
MDC IDC SET LEADCHNL RV PACING AMPLITUDE: 2.5 V
MDC IDC STAT BRADY RV PERCENT PACED: 1 %
Pulse Gen Serial Number: 7214340

## 2017-03-05 ENCOUNTER — Encounter: Payer: Self-pay | Admitting: Gastroenterology

## 2017-03-11 ENCOUNTER — Other Ambulatory Visit: Payer: Self-pay | Admitting: Internal Medicine

## 2017-03-16 ENCOUNTER — Ambulatory Visit (INDEPENDENT_AMBULATORY_CARE_PROVIDER_SITE_OTHER): Payer: Medicare Other | Admitting: General Practice

## 2017-03-16 DIAGNOSIS — Z8679 Personal history of other diseases of the circulatory system: Secondary | ICD-10-CM | POA: Diagnosis not present

## 2017-03-16 DIAGNOSIS — Z7901 Long term (current) use of anticoagulants: Secondary | ICD-10-CM

## 2017-03-16 DIAGNOSIS — I4891 Unspecified atrial fibrillation: Secondary | ICD-10-CM | POA: Diagnosis not present

## 2017-03-16 LAB — POCT INR: INR: 3.1

## 2017-03-16 NOTE — Patient Instructions (Addendum)
Pre visit review using our clinic review tool, if applicable. No additional management support is needed unless otherwise documented below in the visit note.  Skip dosage today (11/13) and then continue to 1 tablet daily except 2 tablets on Sunday/Tuesday/Thursday.  Re-check in 6 weeks.

## 2017-03-19 ENCOUNTER — Ambulatory Visit: Payer: Medicare Other

## 2017-03-23 ENCOUNTER — Ambulatory Visit: Payer: Medicare Other | Admitting: Nurse Practitioner

## 2017-03-23 ENCOUNTER — Encounter: Payer: Self-pay | Admitting: Nurse Practitioner

## 2017-03-23 ENCOUNTER — Telehealth: Payer: Self-pay

## 2017-03-23 VITALS — BP 106/82 | HR 78 | Ht 72.0 in | Wt 285.0 lb

## 2017-03-23 DIAGNOSIS — Z1211 Encounter for screening for malignant neoplasm of colon: Secondary | ICD-10-CM | POA: Diagnosis not present

## 2017-03-23 DIAGNOSIS — Z7901 Long term (current) use of anticoagulants: Secondary | ICD-10-CM | POA: Diagnosis not present

## 2017-03-23 MED ORDER — NA SULFATE-K SULFATE-MG SULF 17.5-3.13-1.6 GM/177ML PO SOLN: ORAL | 0 refills | Status: DC

## 2017-03-23 NOTE — Patient Instructions (Addendum)
If you are age 61 or older, your body mass index should be between 23-30. Your Body mass index is 38.65 kg/m. If this is out of the aforementioned range listed, please consider follow up with your Primary Care Provider.  If you are age 38 or younger, your body mass index should be between 19-25. Your Body mass index is 38.65 kg/m. If this is out of the aformentioned range listed, please consider follow up with your Primary Care Provider.   You have been scheduled for a colonoscopy. Please follow written instructions given to you at your visit today.  Please pick up your prep supplies at the pharmacy within the next 1-3 days. If you use inhalers (even only as needed), please bring them with you on the day of your procedure. Your physician has requested that you go to www.startemmi.com and enter the access code given to you at your visit today. This web site gives a general overview about your procedure. However, you should still follow specific instructions given to you by our office regarding your preparation for the procedure.  We have sent the following medications to your pharmacy for you to pick up at your convenience: [place] will be contacted by our office prior to your procedure for directions on holding your Plavix.  If you do not hear from our office 1 week prior to your scheduled procedure, please call [phone] to discuss.   Thank you for choosing me and [facility].   Tye Savoy, NP

## 2017-03-23 NOTE — Telephone Encounter (Signed)
Anticoagulation clearance request received from [facility] regarding upcoming colonoscopy on 04/26/17 and will need to hold Coumadin 5 days prior. Pt is on warfarin for AFib with CHADSVASc of 6 (CHF, CAD, HTN, DM, CVA in 2000). Given hx of afib and stroke, pt would require bridging with Lovenox per protocol. [facility] previously followed INRs but pt has not been seen in over 1 yr, it appears as though Dr. Jenny Reichmann is now managing warfarin. Please have managing MD coordinate Lovenox bridging as they have current records regarding Coumadin and dosing.

## 2017-03-23 NOTE — Progress Notes (Signed)
Chief Complaint:  Colon cancer screening  HPI:  Patient is a 61 year old male known to Dr. Ardis Hughs from previous screening colonoscopy.  Patient has multiple medical problems not limited to DM2, CVA. AFIB on amiodarone, paroxysmal VT, CAD, non-ischemic cardiomopathy, chronic systolic heart failure with EF of 35-40%, s/p defibrillator placement, CKD. He is on coumadin. No chest pain.  No recent SOB. His O2 saturations have been low at couple of doctors office visits and today it is oxygen saturation was 89%.  No recent shortness of breath.  He describes a chronic non-productive cough.  He required supplemental 0xygen for a week in June after hospital discharge but hasn't required any since.   Patient is referred by PCP, Dr. Cathlean Cower,  for colon cancer screening, last one was in 2008. He has no Gi complaints such as abdominal pain, bowel changes or blood in stool. He has chronic constipation and takes Miralax as needed.    Past Medical History:  Diagnosis Date  . CAD (coronary artery disease)    a. Initial nonobst by cath 2009. b. Cath 01/2013 in setting of VT storm: obstructive distal Cx disease (small and terminates in the AV groove, unlikely to cause significant ischemia or electrical instability), nonobstructive RCA disease, EF 15-20%.   . Cerebrovascular accident [facility])    a. 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 Chronic systolic CHF (congestive heart failure) ([place])    a. Likely NICM (out of proportion to CAD). b. 2009 - EF 25-30% by echo, 01/2013: 15-20% by cath. c. 11/2014 Echo: EF 35-40%, Gr1 DD, mild MR, mod TR, PASP 56mmHg.  . CKD (chronic kidney disease), stage II   . Dyslipidemia   . Gout   . HTN (hypertension)   . Hypokalemia   . ICD (implantable cardiac defibrillator) in place   . Insulin dependent diabetes mellitus ([place])   . Lipoma   . Nonischemic cardiomyopathy ([place])    a.  11/2014 Echo: EF 35-40%, Gr1 DD, mild MR, mod TR, PASP 42mmHg.  . OSA (obstructive sleep apnea)    does not wear cpap  . PAF (paroxysmal atrial fibrillation) ([place])    a. Noted 05/2008 by EKG;  b. CHA2DS2VASc = 5-6-->coumadin.  . Paroxysmal VT ([place])    a. s/p St. Jude ICD 2007. b. H/o paroxysmal VT/VF including VT storm 12/2012 admission prompting amio initiation;  c. 01/2013 ICD upgrade SJM 1411-36Q Ellipse VR single lead ICD.  [name] Pulmonary HTN ([place])    a. Mild by cath 01/2013.     Past Surgical History:  Procedure Laterality Date  . CARDIAC CATHETERIZATION     Nonobstructive coronary disease 2009  . CARDIAC DEFIBRILLATOR PLACEMENT     ICD-St. Jude  . ICD GENERATOR CHANGE N/A 01/15/2014   Performed by Evans Lance, MD at [facility] CATH LAB  . LEFT AND RIGHT HEART CATHETERIZATION WITH CORONARY ANGIOGRAM N/A 01/03/2013   Performed by Martinique, Peter M, MD at [facility] CATH LAB  . LIPOMA EXCISION     Family History  Problem Relation Age of Onset  . Coronary artery disease Mother   . Heart attack Mother   . Heart attack Maternal Uncle   . Heart attack Maternal Grandmother    Social History   Tobacco Use  . Smoking status: Former Smoker    Last attempt to quit: 05/04/1986    Years since quitting: 30.9  . Smokeless tobacco: Never Used  . Tobacco comment: Quit smoking 30 yrs ago. Smoked as teenager less than 1/2 ppd.  Smoked x 4 years.  Substance Use Topics  . Alcohol use: No    Comment: occasionally  . Drug use: No   Current Outpatient Medications  Medication Sig Dispense Refill  . ACCU-CHEK SOFTCLIX LANCETS lancets Use to help check blood sugar once a day Dx e11.9 100 each 3  . allopurinol (ZYLOPRIM) 300 MG tablet TAKE 1 TABLET BY MOUTH  DAILY 90 tablet 3  . amiodarone (PACERONE) 200 MG tablet Take 1 tablet (200 mg total) by mouth daily. 90 tablet 3  . amLODipine (NORVASC) 10 MG tablet Take 1 tablet (10 mg total) by mouth daily. 90 tablet 3  . atorvastatin (LIPITOR) 80 MG tablet TAKE 1 TABLET BY MOUTH AT  BEDTIME 90 tablet 3  . B-D ULTRAFINE III SHORT PEN 31G X 8 MM MISC USE ONCE DAILY WITH  INSULIN. DIAGNOSIS 100 each 1  . carvedilol (COREG) 25 MG tablet TAKE 2 TABLETS BY MOUTH 2  TIMES DAILY WITH A MEAL. 360 tablet 1  . cyclobenzaprine (FLEXERIL) 10 MG tablet Take 1 tablet (10 mg total) by mouth 3 (three) times daily as needed for muscle spasms. 15 tablet 0  . furosemide (LASIX) 80 MG tablet TAKE 1 TABLET BY MOUTH  DAILY 90 tablet 3  . glucose blood (ACCU-CHEK SMARTVIEW) test strip 1 each by Other route daily. Use to check blood sugar once a day Dx e11.9 100 each 3  . hydrALAZINE (APRESOLINE) 50 MG tablet TAKE 1 TABLET (50 MG TOTAL) BY MOUTH EVERY 8 (EIGHT) HOURS. 270 tablet 1  . hydrALAZINE (APRESOLINE) 50 MG tablet TAKE 1 TABLET BY MOUTH  EVERY 8 HOURS 270 tablet 1  . isosorbide dinitrate (ISORDIL) 20 MG tablet TAKE 1 TABLET BY MOUTH 3  TIMES DAILY 270 tablet 2  . LANTUS SOLOSTAR 100 UNIT/ML Solostar Pen INJECT 10 UNITS INTO THE SKIN AT BEDTIME. 15 pen 0  . Multiple Vitamin (MULTIVITAMIN) capsule Take 1 capsule by mouth daily.     . nitroGLYCERIN (NITROSTAT) 0.4 MG SL tablet PLACE 1 TABLET UNDER TONGUE EVERY 5 MINS X3 DOSES AS NEEDED FOR CHEST PAIN 25 tablet 11  . potassium chloride SA (K-DUR,KLOR-CON) 20 MEQ tablet TAKE 2 TABLETS BY MOUTH  DAILY 180 tablet 3  . spironolactone (ALDACTONE) 25 MG tablet TAKE 1 TABLET BY MOUTH  DAILY 90 tablet 0  . traMADol (ULTRAM) 50 MG tablet Take 1 tablet (50 mg total) by mouth every 8 (eight) hours as needed. 60 tablet 2  . warfarin (COUMADIN) 5 MG tablet Take 2 tablets (10 mg) on Sun/Tues/Thurs/Sat and take 1 tablet (5 mg) on Mon/Wed/Friday OR  AS DIRECTED BY ANTICOAGULATION CLINIC 130 tablet 1   No current facility-administered medications for this visit.    No Known Allergies   Review of Systems: All systems reviewed and negative except where noted in HPI.    Physical Exam: BP 106/82   Pulse 78   Ht 6' (1.829 m)   Wt 285 lb (129.3 kg)   SpO2 (!) 89%   BMI 38.65 kg/m  Constitutional:  Well-developed, black male in no acute  distress. Psychiatric: Normal mood and affect. Behavior is normal. EENT: Pupils normal.  Conjunctivae are normal. No scleral icterus. Neck supple.  Cardiovascular: Normal rate, regular rhythm. No edema Pulmonary/chest: Effort normal and breath sounds normal. No wheezing, rales or rhonchi. Abdominal: Soft, nondistended. Nontender. Bowel sounds active throughout. There are no masses palpable. No hepatomegaly. Lymphadenopathy: No cervical adenopathy noted. Neurological: Alert and oriented to person place and time. Skin: Skin  is warm and dry. No rashes noted.   ASSESSMENT AND PLAN:   1. 60 yo male with multiple medical problems including CKD 2, non-ischemic cardiomyopathy and chronic systolic heart failure with EF of 35-40%, s/p defibrillator placement. On chronic coumadin. He is due for a screening colonoscopy. No GI complaints such as bowel  -Patient will be scheduled for a screening colonoscopy with possible polypectomy at [facility] unless Dr. Ardis Hughs prefers it be done at [facility].  The risks and benefits of the procedure were discussed and the patient agrees to proceed. Will schedule procedure to be done at [facility]  -consider Cologuard if Cardiology has any concerns about holding coumadin for screening colonoscopy  2. Chronic anticoagulation.  -Hold coumadin 5 days before procedure - will instruct when and how to resume after procedure. Patient understands that there is a low but real risk of cardiovascular event such as heart attack, stroke, embolism / thrombosis while off coumadin. He consents to proceed. Will communicate by phone or EMR with patient's prescribing provider to confirm that holding coumadin is reasonable in this case.   Tye Savoy, NP  03/23/2017, 11:19 AM  Cc: Biagio Borg, MD

## 2017-03-23 NOTE — Telephone Encounter (Signed)
[facility] [address] [place], [place]  [address] Phone:  [phone]   Fax:  [phone]  03/23/2017   RE:      [name] DOB:   [birth year] MRN:   [id number]   Dear Dr. Lovena Le,    We have scheduled the above patient for an endoscopic procedure. Our records show that he is on anticoagulation therapy.   Please advise as to how long the patient may come off his therapy of Warfarin prior to the colonoscopy procedure, which is scheduled for 04/26/17.  Please fax back/ or route the completed form to [place] at [phone].   Sincerely,    Thurmon Fair, RMA

## 2017-03-26 ENCOUNTER — Encounter: Payer: Self-pay | Admitting: Nurse Practitioner

## 2017-03-28 NOTE — Progress Notes (Signed)
I agree with the above note, plan 

## 2017-04-06 ENCOUNTER — Telehealth: Payer: Self-pay

## 2017-04-06 NOTE — Telephone Encounter (Signed)
I manage patient's INR and will follow up with patient regarding holding coumadin for procedure and the Lovenox bridge.  He actually has an appointment with me next week.  I will address it then.

## 2017-04-06 NOTE — Telephone Encounter (Signed)
Following up on phone note dated 03/23/17 regarding holding Warfarin prior to colonoscopy with Lovenox bridge.  Please advise regarding contacting patient with bridge.  I left a message on patient's voicemail regarding holding Warfarin for 5 days prior to procedure and requested that he contact [facility]in Clinic or Dr. Jenny Reichmann regarding brigde.  Thanks Peter Congo, [facility]

## 2017-04-06 NOTE — Telephone Encounter (Signed)
Thank you :)

## 2017-04-06 NOTE — Telephone Encounter (Signed)
Thank you for following up - pt's Lovenox bridge will need to be managed by PCP since they are managing Coumadin as well and have up to date information regarding dosing etc. Please see original clearance from the 11/20 telephone note in Epic - I apologize that the appropriate follow up was not done by cardiology in contacting pt's PCP to relay message for need for Lovenox bridging.

## 2017-04-12 ENCOUNTER — Encounter: Payer: Self-pay | Admitting: Gastroenterology

## 2017-04-12 ENCOUNTER — Ambulatory Visit: Payer: Medicare Other | Admitting: Family Medicine

## 2017-04-13 ENCOUNTER — Ambulatory Visit: Payer: Medicare Other

## 2017-04-16 ENCOUNTER — Ambulatory Visit (INDEPENDENT_AMBULATORY_CARE_PROVIDER_SITE_OTHER): Payer: Medicare Other | Admitting: General Practice

## 2017-04-16 ENCOUNTER — Other Ambulatory Visit: Payer: Self-pay | Admitting: General Practice

## 2017-04-16 ENCOUNTER — Telehealth: Payer: Self-pay | Admitting: General Practice

## 2017-04-16 DIAGNOSIS — I4891 Unspecified atrial fibrillation: Secondary | ICD-10-CM | POA: Diagnosis not present

## 2017-04-16 DIAGNOSIS — Z8679 Personal history of other diseases of the circulatory system: Secondary | ICD-10-CM

## 2017-04-16 DIAGNOSIS — Z7901 Long term (current) use of anticoagulants: Secondary | ICD-10-CM

## 2017-04-16 LAB — POCT INR: INR: 3.1

## 2017-04-16 MED ORDER — ENOXAPARIN SODIUM 120 MG/0.8ML ~~LOC~~ SOLN: 120.0000 mg | Freq: Two times a day (BID) | SUBCUTANEOUS | 0 refills | Status: DC

## 2017-04-16 NOTE — Patient Instructions (Addendum)
Pre visit review using our clinic review tool, if applicable. No additional management support is needed unless otherwise documented below in the visit note.  Skip dosage today (12/14) and then continue to 1 tablet daily except 2 tablets on Sunday/Tuesday/Thursday.  Please follow pt instructions.  12/19 - Last dose of coumadin until after procedure. 12/20 - Nothing 12/21 - Lovenox in AM and PM (12 hours apart) 12/22 - Lovenox in AM and PM 12/23 - Lovenox in AM only 12/24 - Procedure (NO LOVENOX TODAY) 12/25 - Lovenox in AM and PM AND 2 1/2 tablets of coumadin 12/26 - Lovenox in AM and PM and 2 1/2 tablets of coumadin 12/27 - Lovenox in AM and PM and 2 1/2 tablets of coumadin 12/28 - Re-check INR today.

## 2017-04-16 NOTE — Telephone Encounter (Signed)
-----   Message from Biagio Borg, MD sent at 04/16/2017 12:00 PM EST ----- Regarding: RE: Lovenox bridge Yes, this would be good idea, thanks ----- Message ----- From: Warden Fillers, RN Sent: 04/16/2017   8:31 AM To: Biagio Borg, MD Subject: Lovenox bridge                                 Hi Dr. Jenny Reichmann,  Patient is having a colonoscopy on 12/14.  OK to bridge with Lovenox?  Please advise.  Thanks, Villa Herb, RN

## 2017-04-21 ENCOUNTER — Telehealth: Payer: Self-pay | Admitting: *Deleted

## 2017-04-21 NOTE — Telephone Encounter (Signed)
[name],  This pt is cleared for anesthetic care at [facility].  Thanks,  [name] 

## 2017-04-21 NOTE — Telephone Encounter (Signed)
[name].  This pt has a complicated medical hx.  Could you please look over his chart?  Thanks, [employer]

## 2017-04-26 ENCOUNTER — Other Ambulatory Visit: Payer: Self-pay

## 2017-04-26 ENCOUNTER — Encounter: Payer: Self-pay | Admitting: Gastroenterology

## 2017-04-26 ENCOUNTER — Ambulatory Visit (AMBULATORY_SURGERY_CENTER): Payer: Medicare Other | Admitting: Gastroenterology

## 2017-04-26 VITALS — BP 112/70 | HR 67 | Temp 95.5°F | Resp 14 | Ht 72.0 in | Wt 285.0 lb

## 2017-04-26 DIAGNOSIS — D123 Benign neoplasm of transverse colon: Secondary | ICD-10-CM | POA: Diagnosis not present

## 2017-04-26 DIAGNOSIS — K649 Unspecified hemorrhoids: Secondary | ICD-10-CM

## 2017-04-26 DIAGNOSIS — Z1211 Encounter for screening for malignant neoplasm of colon: Secondary | ICD-10-CM | POA: Diagnosis present

## 2017-04-26 DIAGNOSIS — D125 Benign neoplasm of sigmoid colon: Secondary | ICD-10-CM | POA: Diagnosis not present

## 2017-04-26 DIAGNOSIS — I509 Heart failure, unspecified: Secondary | ICD-10-CM | POA: Diagnosis not present

## 2017-04-26 MED ORDER — SODIUM CHLORIDE 0.9 % IV SOLN: 500.0000 mL | INTRAVENOUS | Status: DC

## 2017-04-26 NOTE — Patient Instructions (Signed)
YOU HAD AN ENDOSCOPIC PROCEDURE TODAY AT [facility]:   Refer to the procedure report that was given to you for any specific questions about what was found during the examination.  If the procedure report does not answer your questions, please call your gastroenterologist to clarify.  If you requested that your care partner not be given the details of your procedure findings, then the procedure report has been included in a sealed envelope for you to review at your convenience later.  YOU SHOULD EXPECT: Some feelings of bloating in the abdomen. Passage of more gas than usual.  Walking can help get rid of the air that was put into your GI tract during the procedure and reduce the bloating. If you had a lower endoscopy (such as a colonoscopy or flexible sigmoidoscopy) you may notice spotting of blood in your stool or on the toilet paper. If you underwent a bowel prep for your procedure, you may not have a normal bowel movement for a few days.  Please Note:  You might notice some irritation and congestion in your nose or some drainage.  This is from the oxygen used during your procedure.  There is no need for concern and it should clear up in a day or so.  SYMPTOMS TO REPORT IMMEDIATELY:   Following lower endoscopy (colonoscopy or flexible sigmoidoscopy):  Excessive amounts of blood in the stool  Significant tenderness or worsening of abdominal pains  Swelling of the abdomen that is new, acute  Fever of 100F or higher  For urgent or emergent issues, a gastroenterologist can be reached at any hour by calling [phone].   DIET:  We do recommend a small meal at first, but then you may proceed to your regular diet.  Drink plenty of fluids but you should avoid alcoholic beverages for 24 hours.  ACTIVITY:  You should plan to take it easy for the rest of today and you should NOT DRIVE or use heavy machinery until tomorrow (because of the sedation medicines used during the test).     FOLLOW UP: Our staff will call the number listed on your records the next business day following your procedure to check on you and address any questions or concerns that you may have regarding the information given to you following your procedure. If we do not reach you, we will leave a message.  However, if you are feeling well and you are not experiencing any problems, there is no need to return our call.  We will assume that you have returned to your regular daily activities without incident.  If any biopsies were taken you will be contacted by phone or by letter within the next 1-3 weeks.  Please call us at [phone] if you have not heard about the biopsies in 3 weeks.   SIGNATURES/CONFIDENTIALITY: You and/or your care partner have signed paperwork which will be entered into your electronic medical record.  These signatures attest to the fact that that the information above on your After Visit Summary has been reviewed and is understood.  Full responsibility of the confidentiality of this discharge information lies with you and/or your care-partner.  FOLLOW COUMADIN CLINIC'S INSTRUCTIONS RE: LOVENOX AND COUMADIN  Please read over handouts about polyps, hemorrhoids and high fiber diets  Await pathology  Continue other medications as usual

## 2017-04-26 NOTE — Progress Notes (Signed)
Called to room to assist during endoscopic procedure.  Patient ID and intended procedure confirmed with present staff. Received instructions for my participation in the procedure from the performing physician.  

## 2017-04-26 NOTE — Progress Notes (Signed)
Report to PACU, RN, vss, BBS= Clear.  

## 2017-04-26 NOTE — Op Note (Signed)
[place] Patient Name: [name] Procedure Date: 04/26/2017 8:20 AM MRN: [id number] Endoscopist: Milus Banister , MD Age: 61 Referring MD:  Date of Birth: Sep 26, 1955 Gender: Male Account #: [id number] Procedure:                Colonoscopy Indications:              Screening for colorectal malignant neoplasm;                            colonoscopy 2008 no polyps Medicines:                Monitored Anesthesia Care Procedure:                Pre-Anesthesia Assessment:                           - Prior to the procedure, a History and Physical                            was performed, and patient medications and                            allergies were reviewed. The patient's tolerance of                            previous anesthesia was also reviewed. The risks                            and benefits of the procedure and the sedation                            options and risks were discussed with the patient.                            All questions were answered, and informed consent                            was obtained. Prior Anticoagulants: The patient has                            taken Coumadin (warfarin), last dose was 5 days                            prior to procedure. ASA Grade Assessment: III - A                            patient with severe systemic disease. After                            reviewing the risks and benefits, the patient was                            deemed in satisfactory condition to undergo the  procedure.                           After obtaining informed consent, the colonoscope                            was passed under direct vision. Throughout the                            procedure, the patient's blood pressure, pulse, and                            oxygen saturations were monitored continuously. The                            Colonoscope was introduced through the anus and     advanced to the the cecum, identified by                            appendiceal orifice and ileocecal valve. The                            colonoscopy was performed without difficulty. The                            patient tolerated the procedure well. The quality                            of the bowel preparation was excellent. The                            ileocecal valve, appendiceal orifice, and rectum                            were photographed. Scope In: 8:25:41 AM Scope Out: 8:38:56 AM Scope Withdrawal Time: 0 hours 11 minutes 23 seconds  Total Procedure Duration: 0 hours 13 minutes 15 seconds  Findings:                 A 3 mm polyp was found in the transverse colon. The                            polyp was pedunculated. The polyp was removed with                            a cold snare. Resection and retrieval were complete.                           A 12 mm polyp was found in the sigmoid colon. The                            polyp was pedunculated. The polyp was removed with  a hot snare. Resection and retrieval were complete.                           External hemorrhoids were found. The hemorrhoids                            were medium-sized.                           The exam was otherwise without abnormality on                            direct and retroflexion views. Complications:            No immediate complications. Estimated blood loss:                            None. Estimated Blood Loss:     Estimated blood loss: none. Impression:               - One 3 mm polyp in the transverse colon, removed                            with a cold snare. Resected and retrieved.                           - One 12 mm polyp in the sigmoid colon, removed                            with a hot snare. Resected and retrieved.                           - External hemorrhoids.                           - The examination was otherwise normal on direct                             and retroflexion views. Recommendation:           - Patient has a contact number available for                            emergencies. The signs and symptoms of potential                            delayed complications were discussed with the                            patient. Return to normal activities tomorrow.                            Written discharge instructions were provided to the                            patient.                           -  Resume previous diet.                           - Continue present medications.                           You will receive a letter within 2-3 weeks with the                            pathology results and my final recommendations.                           If the polyp(s) is proven to be 'pre-cancerous' on                            pathology, you will need repeat colonoscopy in 3                            years. If the polyp(s) is NOT 'precancerous' on                            pathology then you should repeat colon cancer                            screening in 10 years with colonoscopy without need                            for colon cancer screening by any method prior to                            then (including stool testing). Milus Banister, MD 04/26/2017 8:42:43 AM This report has been signed electronically.

## 2017-04-29 ENCOUNTER — Telehealth: Payer: Self-pay

## 2017-04-29 NOTE — Telephone Encounter (Signed)
  Follow up Call-  Call back number 04/26/2017  Post procedure Call Back phone  # [phone]  Permission to leave phone message Yes  Some recent data might be hidden     Patient questions:  Do you have a fever, pain , or abdominal swelling? No. Pain Score  0 *  Have you tolerated food without any problems? Yes.    Have you been able to return to your normal activities? Yes.    Do you have any questions about your discharge instructions: Diet   No. Medications  No. Follow up visit  No.  Do you have questions or concerns about your Care? No.  Actions: * If pain score is 4 or above: No action needed, pain <4.

## 2017-04-30 ENCOUNTER — Ambulatory Visit (INDEPENDENT_AMBULATORY_CARE_PROVIDER_SITE_OTHER): Payer: Medicare Other | Admitting: General Practice

## 2017-04-30 DIAGNOSIS — Z8679 Personal history of other diseases of the circulatory system: Secondary | ICD-10-CM

## 2017-04-30 DIAGNOSIS — Z7901 Long term (current) use of anticoagulants: Secondary | ICD-10-CM | POA: Diagnosis not present

## 2017-04-30 LAB — POCT INR: INR: 1.5

## 2017-04-30 NOTE — Patient Instructions (Addendum)
Pre visit review using our clinic review tool, if applicable. No additional management support is needed unless otherwise documented below in the visit note.  Take 2 tablets today (12/28) and take 1 1/2 tablets tomorrow (12/29).  On Sunday continue to 1 tablet daily except 2 tablets on Sunday/Tuesday/Thursday.  Re-check in 4 weeks. Take Lovenox today and tomorrow.

## 2017-05-10 ENCOUNTER — Encounter: Payer: Self-pay | Admitting: Gastroenterology

## 2017-05-11 ENCOUNTER — Ambulatory Visit (INDEPENDENT_AMBULATORY_CARE_PROVIDER_SITE_OTHER): Payer: Medicare Other | Admitting: *Deleted

## 2017-05-11 DIAGNOSIS — I428 Other cardiomyopathies: Secondary | ICD-10-CM | POA: Diagnosis not present

## 2017-05-11 NOTE — Progress Notes (Signed)
Remote ICD transmission.   

## 2017-05-12 ENCOUNTER — Encounter: Payer: Self-pay | Admitting: Cardiology

## 2017-05-12 NOTE — Progress Notes (Signed)
Letter  

## 2017-05-13 ENCOUNTER — Ambulatory Visit: Payer: Self-pay

## 2017-05-13 ENCOUNTER — Encounter: Payer: Self-pay | Admitting: Family Medicine

## 2017-05-13 ENCOUNTER — Ambulatory Visit: Payer: Medicare Other | Admitting: Family Medicine

## 2017-05-13 VITALS — BP 112/82 | HR 84 | Ht 72.0 in | Wt 287.0 lb

## 2017-05-13 DIAGNOSIS — G8929 Other chronic pain: Secondary | ICD-10-CM

## 2017-05-13 DIAGNOSIS — M1712 Unilateral primary osteoarthritis, left knee: Secondary | ICD-10-CM | POA: Diagnosis not present

## 2017-05-13 DIAGNOSIS — M25562 Pain in left knee: Secondary | ICD-10-CM

## 2017-05-13 NOTE — Patient Instructions (Signed)
Good to see you  [name] is your friend.  Stay active.  pennsaid pinkie amount topically 2 times daily as needed.   I think you would benefit from viscosupplementation. I would ask you to check with your insurance on coverage. Information they will need includes Diagnosis code- M17.0, M17.2 CPT codes:    Synvisc J7325   monovisc G0982   Orthovisc U6751 See which one is covered then call us at [phone] and we will schedule you   See me again in 4 weeks

## 2017-05-13 NOTE — Assessment & Plan Note (Signed)
Given injection.  Tolerated the procedure well.  We discussed icing regimen and home exercises.  We discussed which activities of doing which wants to avoid.  Increase activity as tolerated.  Follow-up with me again 4 weeks.  Could be a Visco supplementation.

## 2017-05-13 NOTE — Progress Notes (Signed)
[name] [address], [place] [address] Phone: [phone] Subjective:    CC: Left knee  VPX:[id number]  [name] is a 62 y.o. male coming in for left knee pain. He would like an injection today in his knee. He has had a nagging pain he states for the past 6 months. He notices a stiffness when he is walking. Stairs seem to bother him. He said he was here for his left knee before and he did not get an injection.        Past Medical History:  Diagnosis Date  . CAD (coronary artery disease)    a. Initial nonobst by cath 2009. b. Cath 01/2013 in setting of VT storm: obstructive distal Cx disease (small and terminates in the AV groove, unlikely to cause significant ischemia or electrical instability), nonobstructive RCA disease, EF 15-20%.   . Cerebrovascular accident [facility])    a. 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 Chronic systolic CHF (congestive heart failure) ([place])    a. Likely NICM (out of proportion to CAD). b. 2009 - EF 25-30% by echo, 01/2013: 15-20% by cath. c. 11/2014 Echo: EF 35-40%, Gr1 DD, mild MR, mod TR, PASP 6mmHg.  . CKD (chronic kidney disease), stage II   . Dyslipidemia   . Gout   . HTN (hypertension)   . Hypokalemia   . ICD (implantable cardiac defibrillator) in place   . Insulin dependent diabetes mellitus ([place])   . Lipoma   . Nonischemic cardiomyopathy ([place])    a.  11/2014 Echo: EF 35-40%, Gr1 DD, mild MR, mod TR, PASP 39mmHg.  . OSA (obstructive sleep apnea)    does not wear cpap  . PAF (paroxysmal atrial fibrillation) ([place])    a. Noted 05/2008 by EKG;  b. CHA2DS2VASc = 5-6-->coumadin.  . Paroxysmal VT ([place])    a. s/p St. Jude ICD 2007. b. H/o paroxysmal VT/VF including VT storm 12/2012 admission prompting amio initiation;  c. 01/2013 ICD upgrade SJM 1411-36Q Ellipse VR single lead ICD.  [name] Pulmonary HTN ([place])    a. Mild by cath 01/2013.   Past Surgical History:  Procedure Laterality Date  . CARDIAC CATHETERIZATION      Nonobstructive coronary disease 2009  . CARDIAC DEFIBRILLATOR PLACEMENT     ICD-St. Jude  . IMPLANTABLE CARDIOVERTER DEFIBRILLATOR (ICD) GENERATOR CHANGE N/A 01/15/2014   Procedure: ICD GENERATOR CHANGE;  Surgeon: Evans Lance, MD;  Location: [facility] CATH LAB;  Service: Cardiovascular;  Laterality: N/A;  . LEFT AND RIGHT HEART CATHETERIZATION WITH CORONARY ANGIOGRAM N/A 01/03/2013   Procedure: LEFT AND RIGHT HEART CATHETERIZATION WITH CORONARY ANGIOGRAM;  Surgeon: Peter M Martinique, MD;  Location: [facility] CATH LAB;  Service: Cardiovascular;  Laterality: N/A;  . LIPOMA EXCISION     Social History   Socioeconomic History  . Marital status: Married    Spouse name: None  . Number of children: 2  . Years of education: None  . Highest education level: None  Social Needs  . Financial resource strain: None  . Food insecurity - worry: None  . Food insecurity - inability: None  . Transportation needs - medical: None  . Transportation needs - non-medical: None  Occupational History  . Occupation: custodian    Employer: [employer]  Tobacco Use  . Smoking status: Former Smoker    Last attempt to quit: 05/04/1986    Years since quitting: 31.0  . Smokeless tobacco: Never Used  . Tobacco comment: Quit smoking  30 yrs ago. Smoked as teenager less than 1/2 ppd. Smoked x 4 years.  Substance and Sexual Activity  . Alcohol use: No    Comment: occasionally  . Drug use: No  . Sexual activity: No  Other Topics Concern  . None  Social History Narrative  . None   No Known Allergies Family History  Problem Relation Age of Onset  . Coronary artery disease Mother   . Heart attack Mother   . Heart attack Maternal Uncle   . Heart attack Maternal Grandmother      Past medical history, social, surgical and family history all reviewed in electronic medical record.  No pertanent information unless stated regarding to the chief complaint.   Review of Systems:Review of systems updated and as accurate as  of 05/13/17  No headache, visual changes, nausea, vomiting, diarrhea, constipation, dizziness, abdominal pain, skin rash, fevers, chills, night sweats, weight loss, swollen lymph nodes, body aches, joint swelling, muscle aches, chest pain, shortness of breath, mood changes.   Objective  Blood pressure 112/82, pulse 84, height 6' (1.829 m), weight 287 lb (130.2 kg), SpO2 (!) 84 %. Systems examined below as of 05/13/17   General: No apparent distress alert and oriented x3 mood and affect normal, dressed appropriately.  HEENT: Pupils equal, extraocular movements intact  Respiratory: Patient's speak in full sentences and does not appear short of breath  Cardiovascular: No lower extremity edema, non tender, no erythema  Skin: Warm dry intact with no signs of infection or rash on extremities or on axial skeleton.  Abdomen: Soft nontender  Neuro: Cranial nerves II through XII are intact, neurovascularly intact in all extremities with 2+ DTRs and 2+ pulses.  Lymph: No lymphadenopathy of posterior or anterior cervical chain or axillae bilaterally.  Gait normal with good balance and coordination.  MSK:  Non tender with full range of motion and good stability and symmetric strength and tone of shoulders, elbows, wrist, hip, and ankles bilaterally.  Knee: Left valgus deformity noted. Large thigh to calf ratio.  Tender to palpation over medial and PF joint line.  ROM full in flexion and extension and lower leg rotation. instability with valgus force.  painful patellar compression. Patellar glide with moderate crepitus. Patellar and quadriceps tendons unremarkable. Hamstring and quadriceps strength is normal. Contralateral knee shows mild arthritic changes as well.  After informed written and verbal consent, patient was seated on exam table. Left knee was prepped with alcohol swab and utilizing anterolateral approach, patient's left knee space was injected with 4:1  marcaine 0.5%: Kenalog 40mg /dL.  Patient tolerated the procedure well without immediate complications.    Impression and Recommendations:     This case required medical decision making of moderate complexity.      Note: This dictation was prepared with Dragon dictation along with smaller phrase technology. Any transcriptional errors that result from this process are unintentional.

## 2017-05-17 LAB — CUP PACEART REMOTE DEVICE CHECK
Battery Remaining Longevity: 74 mo
Battery Remaining Percentage: 72 %
Brady Statistic RV Percent Paced: 1 %
Date Time Interrogation Session: 20190108090020
HIGH POWER IMPEDANCE MEASURED VALUE: 53 Ohm
HighPow Impedance: 53 Ohm
Implantable Lead Model: 7001
Implantable Pulse Generator Implant Date: 20150914
Lead Channel Impedance Value: 430 Ohm
Lead Channel Pacing Threshold Amplitude: 1 V
MDC IDC LEAD IMPLANT DT: 20070817
MDC IDC LEAD LOCATION: 753860
MDC IDC MSMT BATTERY VOLTAGE: 2.98 V
MDC IDC MSMT LEADCHNL RV PACING THRESHOLD PULSEWIDTH: 0.7 ms
MDC IDC MSMT LEADCHNL RV SENSING INTR AMPL: 12 mV
MDC IDC PG SERIAL: 7214340
MDC IDC SET LEADCHNL RV PACING AMPLITUDE: 2.5 V
MDC IDC SET LEADCHNL RV PACING PULSEWIDTH: 0.7 ms
MDC IDC SET LEADCHNL RV SENSING SENSITIVITY: 0.5 mV

## 2017-05-28 ENCOUNTER — Ambulatory Visit (INDEPENDENT_AMBULATORY_CARE_PROVIDER_SITE_OTHER): Payer: Medicare Other | Admitting: General Practice

## 2017-05-28 ENCOUNTER — Other Ambulatory Visit: Payer: Self-pay | Admitting: General Practice

## 2017-05-28 DIAGNOSIS — Z8679 Personal history of other diseases of the circulatory system: Secondary | ICD-10-CM

## 2017-05-28 DIAGNOSIS — I4891 Unspecified atrial fibrillation: Secondary | ICD-10-CM

## 2017-05-28 DIAGNOSIS — Z7901 Long term (current) use of anticoagulants: Secondary | ICD-10-CM | POA: Diagnosis not present

## 2017-05-28 LAB — POCT INR: INR: 2.7

## 2017-05-28 NOTE — Patient Instructions (Addendum)
Pre visit review using our clinic review tool, if applicable. No additional management support is needed unless otherwise documented below in the visit note.  Continue to 1 tablet daily except 2 tablets on Sunday/Tuesday/Thursday.  Re-check in 4 weeks.

## 2017-06-02 ENCOUNTER — Other Ambulatory Visit: Payer: Self-pay | Admitting: Internal Medicine

## 2017-06-09 ENCOUNTER — Other Ambulatory Visit: Payer: Self-pay

## 2017-06-09 MED ORDER — INSULIN GLARGINE 100 UNIT/ML SOLOSTAR PEN: PEN_INJECTOR | SUBCUTANEOUS | 2 refills | Status: DC

## 2017-06-09 MED ORDER — INSULIN PEN NEEDLE 31G X 8 MM MISC: 2 refills | Status: DC

## 2017-06-25 ENCOUNTER — Ambulatory Visit (INDEPENDENT_AMBULATORY_CARE_PROVIDER_SITE_OTHER): Payer: Medicare Other | Admitting: General Practice

## 2017-06-25 DIAGNOSIS — Z7901 Long term (current) use of anticoagulants: Secondary | ICD-10-CM

## 2017-06-25 DIAGNOSIS — Z8679 Personal history of other diseases of the circulatory system: Secondary | ICD-10-CM | POA: Diagnosis not present

## 2017-06-25 DIAGNOSIS — I4891 Unspecified atrial fibrillation: Secondary | ICD-10-CM

## 2017-06-25 LAB — POCT INR: INR: 2.4

## 2017-06-25 NOTE — Patient Instructions (Addendum)
Pre visit review using our clinic review tool, if applicable. No additional management support is needed unless otherwise documented below in the visit note.  Continue to 1 tablet daily except 2 tablets on Sunday/Tuesday/Thursday.  Re-check in 4 weeks.

## 2017-06-27 NOTE — Progress Notes (Signed)
Cardiology Office Note Date:  06/28/2017  Patient ID:  Darius, [name] [birth year], MRN [id number] PCP:  Biagio Borg, MD  Cardiologist: Dr. Percival Spanish Electrophysiologist:  Dr. Lovena Le     Chief Complaint: annual EP/device visit  History of Present Illness: [name] is a 62 y.o. male with history of NICM, chronic CHF (systolic), VT, CAD, CVA, CRI (II), HTN, HLD, DM, PAFib.   He comes today to be seen for Dr. Lovena Le, last seen by him in Jan 2018, at that time no changes were made to his tx.  He feels well.  No CP, palpitations or SOB.  He walks or rides a stationary bike about every other day for exercise, reports his exertional capacity as good, only limited with R knee pain at times.  No dizziness, near syncope or syncope.  His warfarin is monitored and managed with his PMD, denies any bleeding or signs of bleeding.  No issues with control.  He weighs himself most days and reports his weight as stable.  The last time he felt the need to take an extra dose of lasix was at Thanksgiving.  Device information: SJM single chamber ICD, implanted 2007, gen change 2015, Dr. Lovena Le AAD: Amiodarone (2014) 07/15/16 TSH wnl 01/19/17 AST, ALT were wnl   Past Medical History:  Diagnosis Date  . CAD (coronary artery disease)    a. Initial nonobst by cath 2009. b. Cath 01/2013 in setting of VT storm: obstructive distal Cx disease (small and terminates in the AV groove, unlikely to cause significant ischemia or electrical instability), nonobstructive RCA disease, EF 15-20%.   . Cerebrovascular accident [facility])    a. 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 Chronic systolic CHF (congestive heart failure) ([place])    a. Likely NICM (out of proportion to CAD). b. 2009 - EF 25-30% by echo, 01/2013: 15-20% by cath. c. 11/2014 Echo: EF 35-40%, Gr1 DD, mild MR, mod TR, PASP 6mmHg.  . CKD (chronic kidney disease), stage II   . Dyslipidemia   . Gout   . HTN (hypertension)   . Hypokalemia   . ICD  (implantable cardiac defibrillator) in place   . Insulin dependent diabetes mellitus ([place])   . Lipoma   . Nonischemic cardiomyopathy ([place])    a.  11/2014 Echo: EF 35-40%, Gr1 DD, mild MR, mod TR, PASP 19mmHg.  . OSA (obstructive sleep apnea)    does not wear cpap  . PAF (paroxysmal atrial fibrillation) ([place])    a. Noted 05/2008 by EKG;  b. CHA2DS2VASc = 5-6-->coumadin.  . Paroxysmal VT ([place])    a. s/p St. Jude ICD 2007. b. H/o paroxysmal VT/VF including VT storm 12/2012 admission prompting amio initiation;  c. 01/2013 ICD upgrade SJM 1411-36Q Ellipse VR single lead ICD.  [name] Pulmonary HTN ([place])    a. Mild by cath 01/2013.    Past Surgical History:  Procedure Laterality Date  . CARDIAC CATHETERIZATION     Nonobstructive coronary disease 2009  . CARDIAC DEFIBRILLATOR PLACEMENT     ICD-St. Jude  . IMPLANTABLE CARDIOVERTER DEFIBRILLATOR (ICD) GENERATOR CHANGE N/A 01/15/2014   Procedure: ICD GENERATOR CHANGE;  Surgeon: Evans Lance, MD;  Location: [facility] C[facility] LAB;  Service: Cardiovascular;  Laterality: N/A;  . LEFT AND RIGHT HEART CATHETERIZATION WITH CORONARY ANGIOGRAM N/A 01/03/2013   Procedure: LEFT AND RIGHT HEART CATHETERIZATION WITH CORONARY ANGIOGRAM;  Surgeon: Peter M Martinique, MD;  Location: [facility] CATH LAB;  Service: Cardiovascular;  Laterality: N/A;  . LIPOMA EXCISION  Current Outpatient Medications  Medication Sig Dispense Refill  . ACCU-CHEK SOFTCLIX LANCETS lancets Use to help check blood sugar once a day Dx e11.9 100 each 3  . allopurinol (ZYLOPRIM) 300 MG tablet TAKE 1 TABLET BY MOUTH  DAILY 90 tablet 3  . amiodarone (PACERONE) 200 MG tablet Take 1 tablet (200 mg total) by mouth daily. 90 tablet 3  . amLODipine (NORVASC) 10 MG tablet Take 1 tablet (10 mg total) by mouth daily. 90 tablet 3  . atorvastatin (LIPITOR) 80 MG tablet TAKE 1 TABLET BY MOUTH AT  BEDTIME 90 tablet 3  . carvedilol (COREG) 25 MG tablet TAKE 2 TABLETS BY MOUTH 2  TIMES DAILY WITH A MEAL. 360 tablet 1  .  furosemide (LASIX) 80 MG tablet TAKE 1 TABLET BY MOUTH  DAILY 90 tablet 3  . glucose blood (ACCU-CHEK SMARTVIEW) test strip 1 each by Other route daily. Use to check blood sugar once a day Dx e11.9 100 each 3  . hydrALAZINE (APRESOLINE) 50 MG tablet TAKE 1 TABLET (50 MG TOTAL) BY MOUTH EVERY 8 (EIGHT) HOURS. 270 tablet 1  . Insulin Glargine (LANTUS SOLOSTAR) 100 UNIT/ML Solostar Pen INJECT 10 UNITS INTO THE SKIN AT BEDTIME. 15 pen 2  . Insulin Pen Needle (B-D ULTRAFINE III SHORT PEN) 31G X 8 MM MISC USE ONCE DAILY WITH INSULIN 30 each 2  . isosorbide dinitrate (ISORDIL) 20 MG tablet TAKE 1 TABLET BY MOUTH 3  TIMES DAILY 270 tablet 2  . Multiple Vitamin (MULTIVITAMIN) capsule Take 1 capsule by mouth daily.     . nitroGLYCERIN (NITROSTAT) 0.4 MG SL tablet PLACE 1 TABLET UNDER TONGUE EVERY 5 MINS X3 DOSES AS NEEDED FOR CHEST PAIN 25 tablet 11  . potassium chloride SA (K-DUR,KLOR-CON) 20 MEQ tablet TAKE 2 TABLETS BY MOUTH  DAILY 180 tablet 3  . spironolactone (ALDACTONE) 25 MG tablet TAKE 1 TABLET BY MOUTH  DAILY 90 tablet 0  . traMADol (ULTRAM) 50 MG tablet Take 1 tablet (50 mg total) by mouth every 8 (eight) hours as needed. 60 tablet 2  . warfarin (COUMADIN) 5 MG tablet Take 2 tablets (10 mg) on Sun/Tues/Thurs/Sat and take 1 tablet (5 mg) on Mon/Wed/Friday OR  AS DIRECTED BY ANTICOAGULATION CLINIC 130 tablet 1   Current Facility-Administered Medications  Medication Dose Route Frequency Provider Last Rate Last Dose  . 0.9 %  sodium chloride infusion  500 mL Intravenous Continuous Milus Banister, MD        Allergies:   Patient has no known allergies.   Social History:  The patient  reports that he quit smoking about 31 years ago. he has never used smokeless tobacco. He reports that he does not drink alcohol or use drugs.   Family History:  The patient's family history includes Coronary artery disease in his mother; Heart attack in his maternal grandmother, maternal uncle, and mother.  ROS:   Please see the history of present illness.  All other systems are reviewed and otherwise negative.   PHYSICAL EXAM: VS:  BP 130/86   Pulse 78   Ht 6' (1.829 m)   Wt 284 lb (128.8 kg) Comment: Pt stated he weighs 277.4lb at home  BMI 38.52 kg/m  BMI: Body mass index is 38.52 kg/m. Well nourished, well developed, in no acute distress  HEENT: normocephalic, atraumatic  Neck: no JVD, carotid bruits or masses Cardiac:  RRR; no significant murmurs, no rubs, or gallops Lungs:  CTA b/l, no wheezing, rhonchi or rales  Abd:  soft, non-tender, obese MS: no deformity or atrophy Ext: trace edema b/l LE Skin: warm and dry, no rash Neuro:  No gross deficits appreciated Psych: euthymic mood, full affect  ICD site is stable, no tethering or discomfort   EKG:  Done today and reviewed by myself SR, 78bpm, 1st degree AVlockIVCD, LAD, appears unchanged ICD interrogation done today and reviewed by myself:  Battery and lead measurements are good, no observations/arrhythmias.  CorVue looks good/at baseline trending upwards, VP <1%  11/16/14: TTE Study Conclusions - Left ventricle: The cavity size was normal. There was severe   concentric hypertrophy. Systolic function was moderately reduced.   The estimated ejection fraction was in the range of 35% to 40%.   Diffuse hypokinesis. Doppler parameters are consistent with   abnormal left ventricular relaxation (grade 1 diastolic   dysfunction). The E/e&' ratio is >15, suggesting elevated LV   filling pressure. - Mitral valve: Mildly thickened leaflets . There was mild   regurgitation. - Left atrium: The atrium was mildly dilated. - Right ventricle: The cavity size was normal. Wall thickness was   normal. AICD wire noted in right ventricle. Systolic function was   normal. - Right atrium: The atrium was mildly dilated. AICD wire noted in   right atrium. - Tricuspid valve: There was moderate regurgitation. - Pulmonary arteries: PA peak pressure: 42 mm  Hg (S). - Inferior vena cava: The vessel was dilated. The respirophasic   diameter changes were blunted (< 50%), consistent with elevated   central venous pressure. - Pericardium, extracardiac: Small pericardial effusion. Cannot   rule-out tamponade physiology, clinical correlation is advised. Impressions: - Compared to a prior echo in 2013, there is a persistent small   pericardial effusion. EF has improved to 35-40%. There are   increased left and right heart filling pressures with a dilated   IVC.  01/03/13: R/LHC Hemodynamics RA 9/7 mean 7 mm Hg RV 46/7 mm Hg PA 45/16 mean 30 mm Hg PCWP 25/25 mean 23 mm Hg LV 113/16 mm Hg AO 115/79 mean 95 mm Hg Oxygen saturations: PA 62% AO 91% Cardiac Output (Fick) 6.15 L/min  Cardiac Index (Fick) 2.59 L/min/m2    Final Conclusions:   1. Single vessel obstructive CAD involving the distal LCx. This vessel is small and terminates in the AV groove. It is unlikely to cause significant ischemia or electrical instability. 2. Marked LV dysfunction. 3. Mild pulmonary HTN. Recommendations: Medical management.  Recent Labs: 07/15/2016: Hemoglobin 14.5; Platelets 176.0; TSH 1.45 01/19/2017: ALT 19; BUN 23; Creatinine, Ser 1.56; Potassium 4.4; Sodium 141  01/19/2017: Cholesterol 141; HDL 41.10; LDL Cholesterol 87; Total CHOL/HDL Ratio 3; Triglycerides 69.0; VLDL 13.8   CrCl cannot be calculated (Patient's most recent lab result is older than the maximum 21 days allowed.).   Wt Readings from Last 3 Encounters:  06/28/17 284 lb (128.8 kg)  05/13/17 287 lb (130.2 kg)  04/26/17 285 lb (129.3 kg)     Other studies reviewed: Additional studies/records reviewed today include: summarized above  ASSESSMENT AND PLAN:  1. ICD     Intact function, no changes made  2. Hx VT     No shocks sine his gen change at least (2015)     Amiodarone since 2014     Check amio labs, no SOB/pulmonary symptoms  3. NICM, chronic CHF     Weight is down from last  visit here, reported as stable at home     Exam does not suggest fluid OL  On BB, diuretics, hx of being in an ACE was stopped presumably 2/2 CRI, will get BMET  4. CAD     No anginal complaints     On BB/statin, no ASA w/coumadin  5. Paroxysmal AFib     CHA2DS2Vasc is 6, on warfarin     Monitored and managed by his PMD     No symptoms to suggest any AF, single lead ICD  6. HTN     No changes today     Disposition: F/u with Q 3 month remote checks, 1 year in-clinic EP/deviec visit, sooner if needed.  He is over-due for dr. Percival Spanish, will have him scheduled with cardiology.   Current medicines are reviewed at length with the patient today.  The patient did not have any concerns regarding medicines.  Venetia Night, PA-C 06/28/2017 12:12 PM     [address] [place] [place] [address] [phone] (office)  [phone] (fax)

## 2017-06-28 ENCOUNTER — Encounter: Payer: Self-pay | Admitting: Physician Assistant

## 2017-06-28 ENCOUNTER — Ambulatory Visit (INDEPENDENT_AMBULATORY_CARE_PROVIDER_SITE_OTHER): Payer: Medicare Other | Admitting: Physician Assistant

## 2017-06-28 VITALS — BP 130/86 | HR 78 | Ht 72.0 in | Wt 284.0 lb

## 2017-06-28 DIAGNOSIS — I428 Other cardiomyopathies: Secondary | ICD-10-CM

## 2017-06-28 DIAGNOSIS — I5043 Acute on chronic combined systolic (congestive) and diastolic (congestive) heart failure: Secondary | ICD-10-CM | POA: Diagnosis not present

## 2017-06-28 DIAGNOSIS — I480 Paroxysmal atrial fibrillation: Secondary | ICD-10-CM | POA: Diagnosis not present

## 2017-06-28 DIAGNOSIS — I5022 Chronic systolic (congestive) heart failure: Secondary | ICD-10-CM | POA: Diagnosis not present

## 2017-06-28 DIAGNOSIS — Z79899 Other long term (current) drug therapy: Secondary | ICD-10-CM

## 2017-06-28 DIAGNOSIS — I472 Ventricular tachycardia, unspecified: Secondary | ICD-10-CM

## 2017-06-28 DIAGNOSIS — I2510 Atherosclerotic heart disease of native coronary artery without angina pectoris: Secondary | ICD-10-CM

## 2017-06-28 DIAGNOSIS — Z95810 Presence of automatic (implantable) cardiac defibrillator: Secondary | ICD-10-CM | POA: Diagnosis not present

## 2017-06-28 NOTE — Patient Instructions (Signed)
Medication Instructions:   Your physician recommends that you continue on your current medications as directed. Please refer to the Current Medication list given to you today.   If you need a refill on your cardiac medications before your next appointment, please call your pharmacy.  Labwork:  LFT AND TSH TODAY    Testing/Procedures:  NONE ORDERED  TODAY    Follow Up:  NEXT AVAILABLE WITH DR [facility]    Your physician wants you to follow-up in: [place] will receive a reminder letter in the mail two months in advance. If you don't receive a letter, please call our office to schedule the follow-up appointment.   Remote monitoring is used to monitor your Pacemaker of ICD from home. This monitoring reduces the number of office visits required to check your device to one time per year. It allows [place] to keep an eye on the functioning of your device to ensure it is working properly. You are scheduled for a device check from home on .  You may send your transmission at any time that day. If you have a wireless device, the transmission will be sent automatically. After your physician reviews your transmission, you will receive a postcard with your next transmission date.     Any Other Special Instructions Will Be Listed Below (If Applicable).

## 2017-06-29 LAB — TSH: TSH: 1.54 u[IU]/mL (ref 0.450–4.500)

## 2017-06-29 LAB — HEPATIC FUNCTION PANEL
ALBUMIN: 4.2 g/dL (ref 3.6–4.8)
ALK PHOS: 80 IU/L (ref 39–117)
ALT: 24 IU/L (ref 0–44)
AST: 19 IU/L (ref 0–40)
BILIRUBIN TOTAL: 1.3 mg/dL — AB (ref 0.0–1.2)
Bilirubin, Direct: 0.32 mg/dL (ref 0.00–0.40)
Total Protein: 7.2 g/dL (ref 6.0–8.5)

## 2017-07-02 ENCOUNTER — Other Ambulatory Visit: Payer: Self-pay | Admitting: Internal Medicine

## 2017-07-12 ENCOUNTER — Other Ambulatory Visit: Payer: Self-pay

## 2017-07-12 ENCOUNTER — Other Ambulatory Visit: Payer: Self-pay | Admitting: Internal Medicine

## 2017-07-12 MED ORDER — INSULIN PEN NEEDLE 31G X 8 MM MISC: 2 refills | Status: DC

## 2017-07-15 ENCOUNTER — Telehealth: Payer: Self-pay | Admitting: Cardiology

## 2017-07-15 NOTE — Telephone Encounter (Signed)
Closed Encounter  °

## 2017-07-17 ENCOUNTER — Other Ambulatory Visit: Payer: Self-pay | Admitting: Internal Medicine

## 2017-07-20 ENCOUNTER — Ambulatory Visit (INDEPENDENT_AMBULATORY_CARE_PROVIDER_SITE_OTHER): Payer: Medicare Other | Admitting: General Practice

## 2017-07-20 ENCOUNTER — Ambulatory Visit: Payer: Medicare Other | Admitting: Internal Medicine

## 2017-07-20 DIAGNOSIS — I4891 Unspecified atrial fibrillation: Secondary | ICD-10-CM

## 2017-07-20 DIAGNOSIS — Z8679 Personal history of other diseases of the circulatory system: Secondary | ICD-10-CM | POA: Diagnosis not present

## 2017-07-20 DIAGNOSIS — Z7901 Long term (current) use of anticoagulants: Secondary | ICD-10-CM

## 2017-07-20 LAB — POCT INR: INR: 2.7

## 2017-07-20 NOTE — Patient Instructions (Addendum)
Pre visit review using our clinic review tool, if applicable. No additional management support is needed unless otherwise documented below in the visit note.  Continue to 1 tablet daily except 2 tablets on Sunday/Tuesday/Thursday.  Re-check in 6 weeks.

## 2017-07-21 NOTE — Progress Notes (Signed)
Agree with management.  Shown Dissinger J Rayleen Wyrick, MD  

## 2017-07-23 ENCOUNTER — Encounter: Payer: Self-pay | Admitting: Cardiology

## 2017-07-23 ENCOUNTER — Ambulatory Visit: Payer: Medicare Other

## 2017-07-27 ENCOUNTER — Encounter (HOSPITAL_COMMUNITY): Payer: Self-pay | Admitting: *Deleted

## 2017-07-27 ENCOUNTER — Emergency Department (HOSPITAL_COMMUNITY): Payer: Medicare Other

## 2017-07-27 ENCOUNTER — Other Ambulatory Visit: Payer: Self-pay

## 2017-07-27 ENCOUNTER — Inpatient Hospital Stay (HOSPITAL_COMMUNITY)
Admission: EM | Admit: 2017-07-27 | Discharge: 2017-07-31 | DRG: 291 | Disposition: A | Discharge status: Home / Self Care | Payer: Medicare Other | Attending: Internal Medicine | Admitting: Internal Medicine

## 2017-07-27 DIAGNOSIS — Z9119 Patient's noncompliance with other medical treatment and regimen: Secondary | ICD-10-CM

## 2017-07-27 DIAGNOSIS — R0789 Other chest pain: Secondary | ICD-10-CM | POA: Diagnosis not present

## 2017-07-27 DIAGNOSIS — N179 Acute kidney failure, unspecified: Secondary | ICD-10-CM | POA: Diagnosis present

## 2017-07-27 DIAGNOSIS — I2510 Atherosclerotic heart disease of native coronary artery without angina pectoris: Secondary | ICD-10-CM | POA: Diagnosis present

## 2017-07-27 DIAGNOSIS — R509 Fever, unspecified: Secondary | ICD-10-CM | POA: Diagnosis not present

## 2017-07-27 DIAGNOSIS — I130 Hypertensive heart and chronic kidney disease with heart failure and stage 1 through stage 4 chronic kidney disease, or unspecified chronic kidney disease: Principal | ICD-10-CM | POA: Diagnosis present

## 2017-07-27 DIAGNOSIS — E785 Hyperlipidemia, unspecified: Secondary | ICD-10-CM | POA: Diagnosis not present

## 2017-07-27 DIAGNOSIS — I272 Pulmonary hypertension, unspecified: Secondary | ICD-10-CM | POA: Diagnosis not present

## 2017-07-27 DIAGNOSIS — I959 Hypotension, unspecified: Secondary | ICD-10-CM | POA: Diagnosis not present

## 2017-07-27 DIAGNOSIS — J9621 Acute and chronic respiratory failure with hypoxia: Secondary | ICD-10-CM | POA: Diagnosis present

## 2017-07-27 DIAGNOSIS — Z8249 Family history of ischemic heart disease and other diseases of the circulatory system: Secondary | ICD-10-CM

## 2017-07-27 DIAGNOSIS — I4891 Unspecified atrial fibrillation: Secondary | ICD-10-CM | POA: Diagnosis present

## 2017-07-27 DIAGNOSIS — I482 Chronic atrial fibrillation: Secondary | ICD-10-CM | POA: Diagnosis not present

## 2017-07-27 DIAGNOSIS — Z7901 Long term (current) use of anticoagulants: Secondary | ICD-10-CM

## 2017-07-27 DIAGNOSIS — Z95810 Presence of automatic (implantable) cardiac defibrillator: Secondary | ICD-10-CM | POA: Diagnosis present

## 2017-07-27 DIAGNOSIS — Z79899 Other long term (current) drug therapy: Secondary | ICD-10-CM

## 2017-07-27 DIAGNOSIS — Z87891 Personal history of nicotine dependence: Secondary | ICD-10-CM

## 2017-07-27 DIAGNOSIS — N182 Chronic kidney disease, stage 2 (mild): Secondary | ICD-10-CM | POA: Diagnosis not present

## 2017-07-27 DIAGNOSIS — I200 Unstable angina: Secondary | ICD-10-CM | POA: Diagnosis not present

## 2017-07-27 DIAGNOSIS — R072 Precordial pain: Secondary | ICD-10-CM | POA: Diagnosis not present

## 2017-07-27 DIAGNOSIS — E669 Obesity, unspecified: Secondary | ICD-10-CM | POA: Diagnosis present

## 2017-07-27 DIAGNOSIS — I509 Heart failure, unspecified: Secondary | ICD-10-CM

## 2017-07-27 DIAGNOSIS — Z8679 Personal history of other diseases of the circulatory system: Secondary | ICD-10-CM | POA: Diagnosis not present

## 2017-07-27 DIAGNOSIS — I248 Other forms of acute ischemic heart disease: Secondary | ICD-10-CM | POA: Diagnosis present

## 2017-07-27 DIAGNOSIS — I5042 Chronic combined systolic (congestive) and diastolic (congestive) heart failure: Secondary | ICD-10-CM | POA: Diagnosis not present

## 2017-07-27 DIAGNOSIS — G4733 Obstructive sleep apnea (adult) (pediatric): Secondary | ICD-10-CM | POA: Diagnosis present

## 2017-07-27 DIAGNOSIS — M109 Gout, unspecified: Secondary | ICD-10-CM | POA: Diagnosis not present

## 2017-07-27 DIAGNOSIS — I119 Hypertensive heart disease without heart failure: Secondary | ICD-10-CM | POA: Diagnosis present

## 2017-07-27 DIAGNOSIS — E1122 Type 2 diabetes mellitus with diabetic chronic kidney disease: Secondary | ICD-10-CM | POA: Diagnosis present

## 2017-07-27 DIAGNOSIS — I429 Cardiomyopathy, unspecified: Secondary | ICD-10-CM | POA: Diagnosis not present

## 2017-07-27 DIAGNOSIS — I480 Paroxysmal atrial fibrillation: Secondary | ICD-10-CM | POA: Diagnosis not present

## 2017-07-27 DIAGNOSIS — I209 Angina pectoris, unspecified: Secondary | ICD-10-CM | POA: Diagnosis present

## 2017-07-27 DIAGNOSIS — I5043 Acute on chronic combined systolic (congestive) and diastolic (congestive) heart failure: Secondary | ICD-10-CM | POA: Diagnosis not present

## 2017-07-27 DIAGNOSIS — Z6838 Body mass index (BMI) 38.0-38.9, adult: Secondary | ICD-10-CM

## 2017-07-27 DIAGNOSIS — R0902 Hypoxemia: Secondary | ICD-10-CM | POA: Diagnosis not present

## 2017-07-27 DIAGNOSIS — Z8673 Personal history of transient ischemic attack (TIA), and cerebral infarction without residual deficits: Secondary | ICD-10-CM | POA: Diagnosis not present

## 2017-07-27 DIAGNOSIS — Z9111 Patient's noncompliance with dietary regimen: Secondary | ICD-10-CM | POA: Diagnosis not present

## 2017-07-27 DIAGNOSIS — I110 Hypertensive heart disease with heart failure: Secondary | ICD-10-CM | POA: Diagnosis not present

## 2017-07-27 DIAGNOSIS — E119 Type 2 diabetes mellitus without complications: Secondary | ICD-10-CM

## 2017-07-27 DIAGNOSIS — N289 Disorder of kidney and ureter, unspecified: Secondary | ICD-10-CM

## 2017-07-27 DIAGNOSIS — Z794 Long term (current) use of insulin: Secondary | ICD-10-CM

## 2017-07-27 DIAGNOSIS — R0602 Shortness of breath: Secondary | ICD-10-CM | POA: Diagnosis not present

## 2017-07-27 LAB — CBC
HCT: 44 % (ref 39.0–52.0)
Hemoglobin: 13.5 g/dL (ref 13.0–17.0)
MCH: 31 pg (ref 26.0–34.0)
MCHC: 30.7 g/dL (ref 30.0–36.0)
MCV: 100.9 fL — ABNORMAL HIGH (ref 78.0–100.0)
Platelets: 227 10*3/uL (ref 150–400)
RBC: 4.36 MIL/uL (ref 4.22–5.81)
RDW: 15.6 % — ABNORMAL HIGH (ref 11.5–15.5)
WBC: 5.5 10*3/uL (ref 4.0–10.5)

## 2017-07-27 LAB — CBG MONITORING, ED: Glucose-Capillary: 139 mg/dL — ABNORMAL HIGH (ref 65–99)

## 2017-07-27 LAB — BASIC METABOLIC PANEL
Anion gap: 8 (ref 5–15)
BUN: 36 mg/dL — ABNORMAL HIGH (ref 6–20)
CALCIUM: 8.7 mg/dL — AB (ref 8.9–10.3)
CO2: 27 mmol/L (ref 22–32)
Chloride: 106 mmol/L (ref 101–111)
Creatinine, Ser: 1.89 mg/dL — ABNORMAL HIGH (ref 0.61–1.24)
GFR, EST AFRICAN AMERICAN: 43 mL/min — AB (ref >60)
GFR, EST NON AFRICAN AMERICAN: 37 mL/min — AB (ref >60)
Glucose, Bld: 142 mg/dL — ABNORMAL HIGH (ref 65–99)
Potassium: 4.3 mmol/L (ref 3.5–5.1)
Sodium: 141 mmol/L (ref 135–145)

## 2017-07-27 LAB — TROPONIN I
Troponin I: 0.04 ng/mL (ref <0.03)
Troponin I: 0.04 ng/mL (ref <0.03)

## 2017-07-27 LAB — BRAIN NATRIURETIC PEPTIDE: B Natriuretic Peptide: 787.1 pg/mL — ABNORMAL HIGH (ref 0.0–100.0)

## 2017-07-27 LAB — PROTIME-INR
INR: 2.52
PROTHROMBIN TIME: 27 s — AB (ref 11.4–15.2)

## 2017-07-27 LAB — I-STAT TROPONIN, ED: Troponin i, poc: 0.03 ng/mL (ref 0.00–0.08)

## 2017-07-27 MED ORDER — ACETAMINOPHEN 325 MG PO TABS
650.0000 mg | ORAL_TABLET | ORAL | Status: DC | PRN
  Administered 2017-07-29: 650 mg via ORAL
  Filled 2017-07-27: qty 2

## 2017-07-27 MED ORDER — INSULIN GLARGINE 100 UNIT/ML ~~LOC~~ SOLN
10.0000 [IU] | Freq: Every day | SUBCUTANEOUS | Status: DC
  Administered 2017-07-27 – 2017-07-30 (×4): 10 [IU] via SUBCUTANEOUS
  Filled 2017-07-27 (×6): qty 0.1

## 2017-07-27 MED ORDER — AMIODARONE HCL 200 MG PO TABS
200.0000 mg | ORAL_TABLET | Freq: Every day | ORAL | Status: DC
  Administered 2017-07-27 – 2017-07-30 (×4): 200 mg via ORAL
  Filled 2017-07-27 (×4): qty 1

## 2017-07-27 MED ORDER — CARVEDILOL 25 MG PO TABS
50.0000 mg | ORAL_TABLET | Freq: Two times a day (BID) | ORAL | Status: DC
  Administered 2017-07-27: 50 mg via ORAL
  Filled 2017-07-27: qty 2

## 2017-07-27 MED ORDER — FUROSEMIDE 10 MG/ML IJ SOLN
40.0000 mg | Freq: Two times a day (BID) | INTRAMUSCULAR | Status: DC
  Administered 2017-07-27 – 2017-07-29 (×5): 40 mg via INTRAVENOUS
  Filled 2017-07-27 (×5): qty 4

## 2017-07-27 MED ORDER — NITROGLYCERIN 0.4 MG SL SUBL: 0.4000 mg | SUBLINGUAL_TABLET | SUBLINGUAL | Status: DC | PRN

## 2017-07-27 MED ORDER — SODIUM CHLORIDE 0.9% FLUSH: 3.0000 mL | INTRAVENOUS | Status: DC | PRN

## 2017-07-27 MED ORDER — ONDANSETRON HCL 4 MG/2ML IJ SOLN: 4.0000 mg | Freq: Four times a day (QID) | INTRAMUSCULAR | Status: DC | PRN

## 2017-07-27 MED ORDER — ASPIRIN EC 81 MG PO TBEC
81.0000 mg | DELAYED_RELEASE_TABLET | Freq: Every day | ORAL | Status: DC
  Administered 2017-07-28 – 2017-07-29 (×2): 81 mg via ORAL
  Filled 2017-07-27 (×5): qty 1

## 2017-07-27 MED ORDER — SODIUM CHLORIDE 0.9% FLUSH
3.0000 mL | Freq: Two times a day (BID) | INTRAVENOUS | Status: DC
  Administered 2017-07-27 – 2017-07-31 (×9): 3 mL via INTRAVENOUS

## 2017-07-27 MED ORDER — AMIODARONE HCL 200 MG PO TABS: 200.0000 mg | ORAL_TABLET | Freq: Every day | ORAL | Status: DC

## 2017-07-27 MED ORDER — ATORVASTATIN CALCIUM 80 MG PO TABS
80.0000 mg | ORAL_TABLET | Freq: Every day | ORAL | Status: DC
  Administered 2017-07-27 – 2017-07-30 (×4): 80 mg via ORAL
  Filled 2017-07-27 (×5): qty 1

## 2017-07-27 MED ORDER — HYDRALAZINE HCL 50 MG PO TABS
50.0000 mg | ORAL_TABLET | Freq: Three times a day (TID) | ORAL | Status: DC
  Administered 2017-07-27 – 2017-07-28 (×2): 50 mg via ORAL
  Filled 2017-07-27 (×2): qty 1

## 2017-07-27 MED ORDER — FUROSEMIDE 10 MG/ML IJ SOLN
80.0000 mg | Freq: Once | INTRAMUSCULAR | Status: AC
  Administered 2017-07-27: 80 mg via INTRAVENOUS
  Filled 2017-07-27: qty 8

## 2017-07-27 MED ORDER — INSULIN ASPART 100 UNIT/ML ~~LOC~~ SOLN
0.0000 [IU] | Freq: Three times a day (TID) | SUBCUTANEOUS | Status: DC
  Administered 2017-07-27: 2 [IU] via SUBCUTANEOUS
  Administered 2017-07-28: 3 [IU] via SUBCUTANEOUS
  Administered 2017-07-28 – 2017-07-29 (×2): 2 [IU] via SUBCUTANEOUS
  Administered 2017-07-31: 3 [IU] via SUBCUTANEOUS
  Filled 2017-07-27: qty 1

## 2017-07-27 MED ORDER — INSULIN ASPART 100 UNIT/ML ~~LOC~~ SOLN: 0.0000 [IU] | Freq: Every day | SUBCUTANEOUS | Status: DC

## 2017-07-27 MED ORDER — MORPHINE SULFATE (PF) 4 MG/ML IV SOLN: 2.0000 mg | INTRAVENOUS | Status: DC | PRN

## 2017-07-27 MED ORDER — TRAMADOL HCL 50 MG PO TABS: 50.0000 mg | ORAL_TABLET | Freq: Three times a day (TID) | ORAL | Status: DC | PRN

## 2017-07-27 MED ORDER — ALLOPURINOL 300 MG PO TABS
300.0000 mg | ORAL_TABLET | Freq: Every day | ORAL | Status: DC
  Administered 2017-07-28 – 2017-07-31 (×4): 300 mg via ORAL
  Filled 2017-07-27 (×4): qty 1

## 2017-07-27 MED ORDER — ISOSORBIDE DINITRATE 10 MG PO TABS
20.0000 mg | ORAL_TABLET | Freq: Three times a day (TID) | ORAL | Status: DC
  Administered 2017-07-27 – 2017-07-31 (×12): 20 mg via ORAL
  Filled 2017-07-27: qty 2
  Filled 2017-07-27: qty 1
  Filled 2017-07-27 (×2): qty 2
  Filled 2017-07-27: qty 1
  Filled 2017-07-27 (×8): qty 2

## 2017-07-27 MED ORDER — SODIUM CHLORIDE 0.9 % IV SOLN: 250.0000 mL | INTRAVENOUS | Status: DC | PRN

## 2017-07-27 NOTE — ED Notes (Signed)
Patient is stable and ready to be transport to the floor at this time.  Report was called to 3E RN.  Belongings taken with the patient to the floor.   

## 2017-07-27 NOTE — ED Notes (Signed)
Admit Doctor at bedside. Patient told Doctor he took one nitro while waiting in the ED waiting room. Stated felt like a sneeze but did not sneeze.  Currently states no chest pain.

## 2017-07-27 NOTE — ED Provider Notes (Signed)
[facility] EMERGENCY DEPARTMENT Provider Note   CSN: [id number] Arrival date & time: 07/27/17  1041     History   Chief Complaint Chief Complaint  Patient presents with  . Shortness of Breath    HPI [name] is a 62 y.o. male.  HPI Patient presents with shortness of breath.  Has had a for the last few days.  Worse with exertion.  States he feels as if he has some extra fluid on him.  Has been checking his weights and states his weight was 278 today.  States that is rather normal for him.  However when he is weight here he was 284 pounds.  No real chest pain.  States he has not been able to do the same exertion.  Had some extra Lasix last week.  Has swelling in both his legs.  No fevers.  Rare cough.  History of CHF. Past Medical History:  Diagnosis Date  . CAD (coronary artery disease)    a. Initial nonobst by cath 2009. b. Cath 01/2013 in setting of VT storm: obstructive distal Cx disease (small and terminates in the AV groove, unlikely to cause significant ischemia or electrical instability), nonobstructive RCA disease, EF 15-20%.   . Cerebrovascular accident [facility])    a. 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 Chronic systolic CHF (congestive heart failure) ([place])    a. Likely NICM (out of proportion to CAD). b. 2009 - EF 25-30% by echo, 01/2013: 15-20% by cath. c. 11/2014 Echo: EF 35-40%, Gr1 DD, mild MR, mod TR, PASP 48mmHg.  . CKD (chronic kidney disease), stage II   . Dyslipidemia   . Gout   . HTN (hypertension)   . Hypokalemia   . ICD (implantable cardiac defibrillator) in place   . Insulin dependent diabetes mellitus ([place])   . Lipoma   . Nonischemic cardiomyopathy ([place])    a.  11/2014 Echo: EF 35-40%, Gr1 DD, mild MR, mod TR, PASP 54mmHg.  . OSA (obstructive sleep apnea)    does not wear cpap  . PAF (paroxysmal atrial fibrillation) ([place])    a. Noted 05/2008 by EKG;  b. CHA2DS2VASc = 5-6-->coumadin.  . Paroxysmal VT ([place])    a. s/p St. Jude ICD 2007.  b. H/o paroxysmal VT/VF including VT storm 12/2012 admission prompting amio initiation;  c. 01/2013 ICD upgrade SJM [id number] Ellipse VR single lead ICD.  [name] Pulmonary HTN ([place])    a. Mild by cath 01/2013.    Patient Active Problem List   Diagnosis Date Noted  . Hypoxia 09/22/2016  . Tear of MCL (medial collateral ligament) of knee, left, initial encounter 08/21/2016  . Degenerative arthritis of left knee 08/21/2016  . Acute pain of left knee 07/19/2016  . Morbid obesity ([place]) 12/05/2014  . Chronic systolic CHF (congestive heart failure) ([place])   . Type 2 diabetes mellitus ([place]) 11/21/2014  . OSA (obstructive sleep apnea) 11/21/2014  . Acute on chronic combined systolic and diastolic ACC/AHA stage C congestive heart failure ([place])   . Severe sepsis with septic shock ([place]) 11/16/2014  . CAP (community acquired pneumonia) 11/15/2014  . Acute respiratory failure with hypoxia ([place]) 11/15/2014  . Elevated troponin 11/15/2014  . CKD (chronic kidney disease), stage II   . Encounter for therapeutic drug monitoring 06/23/2013  . PAF (paroxysmal atrial fibrillation) ([place]) 12/30/2012  . Acute on chronic systolic CHF (congestive heart failure) ([place]) 12/30/2012  . Nonischemic cardiomyopathy ([place])   . Ventricular tachycardia ([place]) 11/16/2012  . Muscle  strain 09/17/2012  . Warfarin anticoagulation 03/18/2012  . Hearing loss, left 03/18/2012  . Dizziness 12/22/2011  . Edema 09/08/2011  . Obesity 01/16/2011  . Preventative health care 09/04/2010  . Long term (current) use of anticoagulants 08/05/2010  . Automatic implantable cardioverter-defibrillator in situ 08/15/2009  . FATIGUE 06/17/2009  . Coronary atherosclerosis 10/11/2008  . Paroxysmal ventricular tachycardia ([place]) 08/28/2008  . Atrial fibrillation ([place]) 08/28/2008  . Obstructive sleep apnea 06/11/2008  . History of cardiovascular disorder 12/06/2007  . HYPOKALEMIA 05/09/2007  . Hypertensive heart disease 05/09/2007  . LIPOMA 05/06/2007    . Hyperlipidemia 05/06/2007  . Gout 05/06/2007  . Chronic combined systolic and diastolic CHF (congestive heart failure) ([place]) 05/06/2007  . ACUTE BUT ILL-DEFINED CEREBROVASCULAR DISEASE 05/06/2007  . DENTAL CARIES 05/06/2007    Past Surgical History:  Procedure Laterality Date  . CARDIAC CATHETERIZATION     Nonobstructive coronary disease 2009  . CARDIAC DEFIBRILLATOR PLACEMENT     ICD-St. Jude  . IMPLANTABLE CARDIOVERTER DEFIBRILLATOR (ICD) GENERATOR CHANGE N/A 01/15/2014   Procedure: ICD GENERATOR CHANGE;  Surgeon: Evans Lance, MD;  Location[facility] C[facility] LAB;  Service: Cardiovascular;  Laterality: N/A;  . LEFT AND RIGHT HEART CATHETERIZATION WITH CORONARY ANGIOGRAM N/A 01/03/2013   Procedure: LEFT AND RIGHT HEART CATHETERIZATION WITH CORONARY ANGIOGRAM;  Surgeon: Peter M Martinique, MD;  Location: [facility] CATH LAB;  Service: Cardiovascular;  Laterality: N/A;  . LIPOMA EXCISION          Home Medications    Prior to Admission medications   Medication Sig Start Date End Date Taking? Authorizing Provider  ACCU-CHEK SOFTCLIX LANCETS lancets Use to help check blood sugar once a day Dx e11.9 08/20/15   Biagio Borg, MD  allopurinol (ZYLOPRIM) 300 MG tablet TAKE 1 TABLET BY MOUTH  DAILY 09/10/16   Biagio Borg, MD  amiodarone (PACERONE) 200 MG tablet Take 1 tablet (200 mg total) by mouth daily. 09/10/16   Biagio Borg, MD  amLODipine (NORVASC) 10 MG tablet Take 1 tablet (10 mg total) by mouth daily. 09/10/16   Biagio Borg, MD  atorvastatin (LIPITOR) 80 MG tablet TAKE 1 TABLET BY MOUTH AT  BEDTIME 09/10/16   Biagio Borg, MD  carvedilol (COREG) 25 MG tablet TAKE 2 TABLETS BY MOUTH 2  TIMES DAILY WITH A MEAL. 07/19/17   Biagio Borg, MD  furosemide (LASIX) 80 MG tablet TAKE 1 TABLET BY MOUTH  DAILY 09/10/16   Biagio Borg, MD  glucose blood (ACCU-CHEK SMARTVIEW) test strip 1 each by Other route daily. Use to check blood sugar once a day Dx e11.9 08/20/15   Biagio Borg, MD  hydrALAZINE  (APRESOLINE) 50 MG tablet TAKE 1 TABLET (50 MG TOTAL) BY MOUTH EVERY 8 (EIGHT) HOURS. 06/15/16   Minus Breeding, MD  Insulin Glargine (LANTUS SOLOSTAR) 100 UNIT/ML Solostar Pen INJECT 10 UNITS INTO THE SKIN AT BEDTIME. 06/09/17   Biagio Borg, MD  Insulin Pen Needle (B-D ULTRAFINE III SHORT PEN) 31G X 8 MM MISC USE ONCE DAILY WITH INSULIN 07/12/17   Biagio Borg, MD  isosorbide dinitrate (ISORDIL) 20 MG tablet TAKE 1 TABLET BY MOUTH 3  TIMES DAILY 03/11/17   Biagio Borg, MD  Multiple Vitamin (MULTIVITAMIN) capsule Take 1 capsule by mouth daily.     [provider]  nitroGLYCERIN (NITROSTAT) 0.4 MG SL tablet PLACE 1 TABLET UNDER TONGUE EVERY 5 MINS X3 DOSES AS NEEDED FOR CHEST PAIN 07/12/17   Biagio Borg, MD  potassium chloride SA (K-DUR,KLOR-CON) 20 MEQ tablet TAKE 2 TABLETS BY MOUTH  DAILY 09/10/16   Biagio Borg, MD  spironolactone (ALDACTONE) 25 MG tablet TAKE 1 TABLET BY MOUTH  DAILY 06/15/16   Biagio Borg, MD  traMADol (ULTRAM) 50 MG tablet Take 1 tablet (50 mg total) by mouth every 8 (eight) hours as needed. 01/19/17   Biagio Borg, MD  warfarin (COUMADIN) 5 MG tablet TAKE 2 TABLETS SUN, TUES,  THURS, SAT AND TAKE 1  TABLET MON, WED, FRI OR AS  DIRECTED BY ANTICOAGULATION CLINIC 07/02/17   Biagio Borg, MD    Family History Family History  Problem Relation Age of Onset  . Coronary artery disease Mother   . Heart attack Mother   . Heart attack Maternal Uncle   . Heart attack Maternal Grandmother     Social History Social History   Tobacco Use  . Smoking status: Former Smoker    Last attempt to quit: 05/04/1986    Years since quitting: 31.2  . Smokeless tobacco: Never Used  . Tobacco comment: Quit smoking 30 yrs ago. Smoked as teenager less than 1/2 ppd. Smoked x 4 years.  Substance Use Topics  . Alcohol use: No    Comment: occasionally  . Drug use: No     Allergies   Patient has no known allergies.   Review of Systems Review of Systems  Constitutional: Negative  for appetite change and fever.  HENT: Negative for congestion.   Respiratory: Positive for shortness of breath.   Cardiovascular: Positive for leg swelling. Negative for chest pain.  Gastrointestinal: Negative for abdominal pain.  Genitourinary: Negative for dysuria.  Musculoskeletal: Negative for back pain.  Skin: Negative for rash.  Neurological: Negative for syncope.  Hematological: Negative for adenopathy.  Psychiatric/Behavioral: Negative for confusion.     Physical Exam Updated Vital Signs BP 109/71   Pulse 63   Temp 98.6 F (37 C) (Oral)   Resp 19   SpO2 96%   Physical Exam  Constitutional: He appears well-developed.  HENT:  Head: Normocephalic.  Eyes: Pupils are equal, round, and reactive to light.  Cardiovascular: Regular rhythm.  Pulmonary/Chest: Effort normal.  Rales bilaterally bases.   Abdominal: Soft. He exhibits no mass.  Musculoskeletal:       Right lower leg: He exhibits edema.       Left lower leg: He exhibits edema.  Neurological: He is alert.  Skin: Skin is warm. Capillary refill takes less than 2 seconds.     ED Treatments / Results  Labs (all labs ordered are listed, but only abnormal results are displayed) Labs Reviewed  BASIC METABOLIC PANEL - Abnormal; Notable for the following components:      Result Value   Glucose, Bld 142 (*)    BUN 36 (*)    Creatinine, Ser 1.89 (*)    Calcium 8.7 (*)    GFR calc non Af Amer 37 (*)    GFR calc Af Amer 43 (*)    All other components within normal limits  CBC - Abnormal; Notable for the following components:   MCV 100.9 (*)    RDW 15.6 (*)    All other components within normal limits  BRAIN NATRIURETIC PEPTIDE  PROTIME-INR  I-STAT TROPONIN, ED    EKG EKG Interpretation  Date/Time:  Tuesday July 27 2017 10:49:46 EDT Ventricular Rate:  75 PR Interval:  206 QRS Duration: 102 QT Interval:  428 QTC Calculation: 477 R Axis:   -174  Text Interpretation:  Sinus rhythm with Fusion complexes  Right superior axis deviation Anterior infarct , age undetermined Abnormal ECG Confirmed by Davonna Belling [phone]) on 07/27/2017 12:05:27 PM   Radiology Dg Chest 2 View  Result Date: 07/27/2017 CLINICAL DATA:  Increased shortness of breath with minimal exertion. EXAM: CHEST - 2 VIEW COMPARISON:  09/22/2016. FINDINGS: Cardiomegaly. Single lead pacer unchanged. Mild vascular congestion without consolidation or frank edema. No effusion or pneumothorax. Similar appearance to priors. IMPRESSION: Mild vascular congestion without consolidation or frank edema. Cardiomegaly. Electronically Signed   By: Staci Righter M.D.   On: 07/27/2017 11:14    Procedures Procedures (including critical care time)  Medications Ordered in ED Medications  furosemide (LASIX) injection 80 mg (80 mg Intravenous Given 07/27/17 1255)     Initial Impression / Assessment and Plan / ED Course  I have reviewed the triage vital signs and the nursing notes.  Pertinent labs & imaging results that were available during my care of the patient were reviewed by me and considered in my medical decision making (see chart for details).     Patient with shortness of breath.  X-ray shows some volume overload.  Rales on exam.  Edema on both legs.  However pressure is a little low.  Creatinine increased.  However likely a component of CHF.  Hypoxic on room air.  Will admit to hospitalist. Final Clinical Impressions(s) / ED Diagnoses   Final diagnoses:  Congestive heart failure, unspecified HF chronicity, unspecified heart failure type [facility])  Renal insufficiency  Hypoxia    ED Discharge Orders    None       Davonna Belling, MD 07/27/17 1324

## 2017-07-27 NOTE — H&P (Signed)
History and Physical    LINKYN GOBIN GNF:[id number] DOB: [birth year] DOA: 07/27/2017  PCP: Biagio Borg, MD Consultants:  Lovena Le - EP; Hochrein - cardiology Patient coming from:  Home - lives with wife; NOK: Wife, [phone]  Chief Complaint: SOB  HPI: [name] is a 62 y.o. male with medical history significant of pulmonary HTN; afib on Coumadin; OSA not on CPAP; DM; AICD in place; HTN; HLD; stage 2 CKD; CHF (EF 41-32%, grade 1 diastolic dysfunction in 4/40); CVA; and CAD presenting with SOB.   Several weeks ago, he felt SOB.   He thought it was gas and took Gas X without improvement.  He took a laxative and still didn't get better.  He held out for 2 weeks but still got worse.  If he eats or drinks something, he feels like someone is filling his belly with air that goes up to his chest.  He can't walk more than 5 feet without feeling tight.  Last night was his worst night yet.  He couldn't breathe and felt himself calling for oxygen.  He eventually fell asleep.  He drank OJ when he woke up today and took some Tylenol (knee pain).  He walked a few steps and had to sit down because it felt like someone had filled him up with air and he felt tightness around his chest.  Occasional chest pain, for which he takes NTG - last use in the ER and it helped to ease the tension in his chest.  Increased cough compared to usual, but nonproductive.  +LE edema, chronic and unchanged.  He has a scale at home and weight has been fluctuating - 277 this AM which is similar to his usual.    ED Course:  Probably CHF exacerbation - weight up, CXR with volume overload, creatinine increased some.  Review of Systems: As per HPI; otherwise review of systems reviewed and negative.   Ambulatory Status:  Ambulates without assistance  Past Medical History:  Diagnosis Date  . CAD (coronary artery disease)    a. Initial nonobst by cath 2009. b. Cath 01/2013 in setting of VT storm: obstructive distal Cx disease  (small and terminates in the AV groove, unlikely to cause significant ischemia or electrical instability), nonobstructive RCA disease, EF 15-20%.   . Cerebrovascular accident [facility])    a. 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 Chronic systolic CHF (congestive heart failure) ([place])    a. Likely NICM (out of proportion to CAD). b. 2009 - EF 25-30% by echo, 01/2013: 15-20% by cath. c. 11/2014 Echo: EF 35-40%, Gr1 DD, mild MR, mod TR, PASP 63mmHg.  . CKD (chronic kidney disease), stage II   . Dyslipidemia   . Gout   . HTN (hypertension)   . Hypokalemia   . ICD (implantable cardiac defibrillator) in place   . Insulin dependent diabetes mellitus ([place])   . Knee pain, bilateral   . Lipoma   . Nonischemic cardiomyopathy ([place])    a.  11/2014 Echo: EF 35-40%, Gr1 DD, mild MR, mod TR, PASP 57mmHg.  . OSA (obstructive sleep apnea)    does not wear cpap  . PAF (paroxysmal atrial fibrillation) ([place])    a. Noted 05/2008 by EKG;  b. CHA2DS2VASc = 5-6-->coumadin.  . Paroxysmal VT ([employer] [employer])    a. s/p St. Jude ICD 2007. b. H/o paroxysmal VT/VF including VT storm 12/2012 admission prompting amio initiation;  c. 01/2013 ICD upgrade SJM [id number] [place] VR single lead ICD.  [name]  HTN ([place])    a. Mild by cath 01/2013.    Past Surgical History:  Procedure Laterality Date  . CARDIAC CATHETERIZATION     Nonobstructive coronary disease 2009  . CARDIAC DEFIBRILLATOR PLACEMENT     ICD-St. Jude  . IMPLANTABLE CARDIOVERTER DEFIBRILLATOR (ICD) GENERATOR CHANGE N/A 01/15/2014   Procedure: ICD GENERATOR CHANGE;  Surgeon: Evans Lance, MD;  Location: [facility] CATH LAB;  Service: Cardiovascular;  Laterality: N/A;  . LEFT AND RIGHT HEART CATHETERIZATION WITH CORONARY ANGIOGRAM N/A 01/03/2013   Procedure: LEFT AND RIGHT HEART CATHETERIZATION WITH CORONARY ANGIOGRAM;  Surgeon: Peter M Martinique, MD;  Location: [facility] CATH LAB;  Service: Cardiovascular;  Laterality: N/A;  . LIPOMA EXCISION      Social History   Socioeconomic History  .  Marital status: Married    Spouse name: Not on file  . Number of children: 2  . Years of education: Not on file  . Highest education level: Not on file  Occupational History  . Occupation: medically retired due to heart failure  Social Needs  . Financial resource strain: Not on file  . Food insecurity:    Worry: Not on file    Inability: Not on file  . Transportation needs:    Medical: Not on file    Non-medical: Not on file  Tobacco Use  . Smoking status: Former Smoker    Last attempt to quit: 05/04/1986    Years since quitting: 31.2  . Smokeless tobacco: Never Used  . Tobacco comment: Quit smoking 30 yrs ago. Smoked as teenager less than 1/2 ppd. Smoked x 4 years.  Substance and Sexual Activity  . Alcohol use: No    Comment: occasionally  . Drug use: No  . Sexual activity: Never  Lifestyle  . Physical activity:    Days per week: Not on file    Minutes per session: Not on file  . Stress: Not on file  Relationships  . Social connections:    Talks on phone: Not on file    Gets together: Not on file    Attends religious service: Not on file    Active member of club or organization: Not on file    Attends meetings of clubs or organizations: Not on file    Relationship status: Not on file  . Intimate partner violence:    Fear of current or ex partner: Not on file    Emotionally abused: Not on file    Physically abused: Not on file    Forced sexual activity: Not on file  Other Topics Concern  . Not on file  Social History Narrative  . Not on file    No Known Allergies  Family History  Problem Relation Age of Onset  . Coronary artery disease Mother   . Heart attack Mother   . Heart attack Maternal Uncle   . Heart attack Maternal Grandmother     Prior to Admission medications   Medication Sig Start Date End Date Taking? Authorizing Provider  ACCU-CHEK SOFTCLIX LANCETS lancets Use to help check blood sugar once a day Dx e11.9 08/20/15   Biagio Borg, MD    allopurinol (ZYLOPRIM) 300 MG tablet TAKE 1 TABLET BY MOUTH  DAILY 09/10/16   Biagio Borg, MD  amiodarone (PACERONE) 200 MG tablet Take 1 tablet (200 mg total) by mouth daily. 09/10/16   Biagio Borg, MD  amLODipine (NORVASC) 10 MG tablet Take 1 tablet (10 mg total) by mouth daily. 09/10/16  Biagio Borg, MD  atorvastatin (LIPITOR) 80 MG tablet TAKE 1 TABLET BY MOUTH AT  BEDTIME 09/10/16   Biagio Borg, MD  carvedilol (COREG) 25 MG tablet TAKE 2 TABLETS BY MOUTH 2  TIMES DAILY WITH A MEAL. 07/19/17   Biagio Borg, MD  furosemide (LASIX) 80 MG tablet TAKE 1 TABLET BY MOUTH  DAILY 09/10/16   Biagio Borg, MD  glucose blood (ACCU-CHEK SMARTVIEW) test strip 1 each by Other route daily. Use to check blood sugar once a day Dx e11.9 08/20/15   Biagio Borg, MD  hydrALAZINE (APRESOLINE) 50 MG tablet TAKE 1 TABLET (50 MG TOTAL) BY MOUTH EVERY 8 (EIGHT) HOURS. 06/15/16   Minus Breeding, MD  Insulin Glargine (LANTUS SOLOSTAR) 100 UNIT/ML Solostar Pen INJECT 10 UNITS INTO THE SKIN AT BEDTIME. 06/09/17   Biagio Borg, MD  Insulin Pen Needle (B-D ULTRAFINE III SHORT PEN) 31G X 8 MM MISC USE ONCE DAILY WITH INSULIN 07/12/17   Biagio Borg, MD  isosorbide dinitrate (ISORDIL) 20 MG tablet TAKE 1 TABLET BY MOUTH 3  TIMES DAILY 03/11/17   Biagio Borg, MD  Multiple Vitamin (MULTIVITAMIN) capsule Take 1 capsule by mouth daily.     [provider]  nitroGLYCERIN (NITROSTAT) 0.4 MG SL tablet PLACE 1 TABLET UNDER TONGUE EVERY 5 MINS X3 DOSES AS NEEDED FOR CHEST PAIN 07/12/17   Biagio Borg, MD  potassium chloride SA (K-DUR,KLOR-CON) 20 MEQ tablet TAKE 2 TABLETS BY MOUTH  DAILY 09/10/16   Biagio Borg, MD  spironolactone (ALDACTONE) 25 MG tablet TAKE 1 TABLET BY MOUTH  DAILY 06/15/16   Biagio Borg, MD  traMADol (ULTRAM) 50 MG tablet Take 1 tablet (50 mg total) by mouth every 8 (eight) hours as needed. 01/19/17   Biagio Borg, MD  warfarin (COUMADIN) 5 MG tablet TAKE 2 TABLETS SUN, TUES,  THURS, SAT AND TAKE 1   TABLET MON, WED, FRI OR AS  DIRECTED BY ANTICOAGULATION CLINIC 07/02/17   Biagio Borg, MD    Physical Exam: Vitals:   07/27/17 1330 07/27/17 1345 07/27/17 1400 07/27/17 1415  BP: 119/85 110/79 116/83 123/89  Pulse: 62 63 61 64  Resp: (!) 24 (!) 22 18 20   Temp:      TempSrc:      SpO2: 93% 95% 94% 94%     General:  Appears calm and comfortable and is NAD Eyes:  PERRL, EOMI, normal lids, iris ENT:  grossly normal hearing, lips & tongue, mmm Neck:  no LAD, masses or thyromegaly; no carotid bruits Cardiovascular:  RRR, no m/r/g. No LE edema.  Respiratory:   CTA bilaterally with no wheezes/rales/rhonchi.  Normal respiratory effort at rest but with sitting up it appeared to require a tremendous amount of strength. Abdomen:  soft, NT, ND, NABS Back:   normal alignment, no CVAT Skin:  no rash or induration seen on limited exam Musculoskeletal:  grossly normal tone BUE/BLE, good ROM, no bony abnormality Lower extremity:  No LE edema.  Limited foot exam with no ulcerations.  2+ distal pulses. Psychiatric:  grossly normal mood and affect, speech fluent and appropriate, AOx3 Neurologic:  CN 2-12 grossly intact, moves all extremities in coordinated fashion, sensation intact    Radiological Exams on Admission: Dg Chest 2 View  Result Date: 07/27/2017 CLINICAL DATA:  Increased shortness of breath with minimal exertion. EXAM: CHEST - 2 VIEW COMPARISON:  09/22/2016. FINDINGS: Cardiomegaly. Single lead pacer unchanged. Mild vascular congestion without consolidation  or frank edema. No effusion or pneumothorax. Similar appearance to priors. IMPRESSION: Mild vascular congestion without consolidation or frank edema. Cardiomegaly. Electronically Signed   By: Staci Righter M.D.   On: 07/27/2017 11:14    EKG: Independently reviewed.  NSR with rate 75; nonspecific ST changes with no evidence of acute ischemia   Labs on Admission: I have personally reviewed the available labs and imaging studies at the  time of the admission.  Pertinent labs:   Glucose 142 BUN 36/Creatinine 1.89/GFR 43; 23/1.56/59 on 9/18 Troponin 0.03 Essentially normal CBC INR 2.7 on 3/19 TSH 1.540 on 2/25 Chol 141/HDL 41/LDL 87/TG 69 on 9/18 BNP pending A1c in 9/18 was 5.7  Assessment/Plan Principal Problem:   Ischemic chest pain Active Problems:   Hyperlipidemia   Hypertensive heart disease   Atrial fibrillation (HCC)   Automatic implantable cardioverter-defibrillator in situ   Long term (current) use of anticoagulants   CKD (chronic kidney disease), stage II   Type 2 diabetes mellitus (HCC)   Acute on chronic combined systolic (congestive) and diastolic (congestive) heart failure ([place])   Concern for Ischemic chest pain -Patient presenting with escalating pattern of exertional CP and SOB -While this may be related to CHF (as reported by ER) or GI, his symptoms are concerning for unstable angina -Pain/SOB is improved with NTG. -CXR unremarkable.   -Initial cardiac enzymes negative.   -EKG with nonspecific ST changes, no apparent STEMI. -TIMI risk score is 4; which predicts a 14 days risk of death, recurrent MI, or urgent revascularization of 19.9%.  -Will plan to admit to telemetry to further evaluate for ACS -Heparin infusion for now (rather than Coumadin) -Cardiology consulted via [facility] -cycle troponin q6h x 3 and repeat EKG in AM -ASA daily -morphine given -Continue Lipitor 80 mg qhs - ?compliance given recent LDL 87 on this medication/dose -If CP/CHF evaluation is negative, consider inpatient vs. Outpatient GI evaluation -Has AICD which has not fired  CHF exacerbation -As noted above, patient with chest discomfort and feeling of fullness/SOB that appears to be exertional and has been escalating -h/o combined CHF, appears to have last had Echo in 7/16 with EF 35-40% and grade 1 diastolic dysfunction -CXR with pulmonary vascular congestion but not frank edema -His weight on 2/25 was  284 -BNP is pending -No LE edema -He does not appear to be excessively volume overloaded at this time -Will request echocardiogram -Will continue ASA -No ACE due to AKI on CKD -He is on a large dose of Coreg, will continue for now -Hold Norvasc in the setting of possible CHF -Was given Lasix 80 mg x 1 in ER and will repeat with 40 mg IV BID - but monitor renal function and hold if worsening -prn [place] O2 for now -Repeat EKG in AM  Afib on Coumadin -Rate controlled on Amiodarone and high-dose Coreg -CHA2DS2-VASc score >2, on chronic Coumadin -Will transition to Heparin for now in case of need for intervention  AKI on CKD -Baseline creatinine about 1.4 -Currently 1.89 -This may represent diminished renal perfusion in the setting of CHF; may be associated with decreased PO intake and so hypovolemia related to symptoms noted in HPI; or could be progression of renal dysfunction -Monitor daily with diuresis and hold diuresis if renal function is worsening  DM -Good control on last A1c -Continue Lantus -Cover with moderate-scale SSI  HTN -Hold Norvasc, as noted above -Continue Coreg - but this is a very high dose -Continue hydralazine  HLD -Continue high-dose Lipitor -  Question compliance -If patient is compliant, consider transition to high-dose Crestor since LDL is >70  DVT prophylaxis: Heparin drip Code Status:  Full - confirmed with patient Family Communication: None present Disposition Plan:  Home once clinically improved Consults called: Cardiology  Admission status: Admit - It is my clinical opinion that admission to INPATIENT is reasonable and necessary because of the expectation that this patient will require hospital care that crosses at least 2 midnights to treat this condition based on the medical complexity of the problems presented.  Given the aforementioned information, the predictability of an adverse outcome is felt to be significant.    Karmen Bongo MD [facility]  If note is complete, please contact covering daytime or nighttime physician. [email or web address] Password [email or web address]  07/27/2017, 2:50 PM

## 2017-07-27 NOTE — ED Triage Notes (Signed)
To ED for eval of increased sob with minimal exertion for last 2 weeks. Sats noted at 86% on RA. Placed on 2L O2 [place] and up to 94%. Pt states he felt like he needed to take an extra lasix - hasn't every day but did last week.

## 2017-07-27 NOTE — ED Notes (Signed)
Doctor at bedside.

## 2017-07-27 NOTE — Progress Notes (Signed)
Patient asking about taking his pm dose of warfarin. Not ordered for patient right now.

## 2017-07-27 NOTE — Consult Note (Signed)
Cardiology Consultation:   Patient ID: [name]; [id number]; Feb 04, 1956   Admit date: 07/27/2017 Date of Consult: 07/27/2017  Primary Care Provider: Biagio Borg, MD Primary Cardiologist:  Percival Spanish,  Primary Electrophysiologist:  Lovena Le    Patient Profile:   Darius HAUPERT is a 62 y.o. male with a hx of chronic combined systolic and diastolic congestive heart failure, nonischemic cardia myopathy, ventricular tachycardia, mild coronary artery disease, CVA, paroxysmal atrial fibrillation who is being seen today for the evaluation of chest discomfort at the request of Dr. Lorin Mercy.  History of Present Illness:   [name] is a 62 year old gentleman followed by Dr. Percival Spanish and Dr. Lovena Le.  He has an extensive history of chronic systolic congestive heart failure.  He has a history of ventricular tachycardia and has an ICD placed. A history of  coronary artery disease by heart catheterization in 2009 and repeat cath in 2014. Heart catheterization in 2014 reveals normal left anterior descending artery.  The dominant left circumflex artery has a focal 80-90% stenosis in the  Terminal  circumflex artery following the last obtuse marginal artery. The small nondominant right coronary artery has a 50-60% mid stenosis. Left ventricular systolic function was markedly reduced with an ejection fraction of 15-20%.  For the past several weeks he has had lots of belly bloating.  If he eats or drinks it he gets very distended.  This is worsened by exercise.  Recently he has had significant chest tightness and fullness if he walks as little as 5-10 feet.  He states he is not been able to exercise at all because of that.  Troponin levels are minimally elevated.   Peak troponin is 0.04   He has a history of obstructive sleep apnea.  He   does not wear his CPAP mask.   Has a history of obesity.  He states that he tries to eat a relatively low-salt diet but in reviewing his diet it is clear that he still  states he eats some salty foods-lunch meat, bacon, sausage, occasional hot dogs  Past Medical History:  Diagnosis Date  . CAD (coronary artery disease)    a. Initial nonobst by cath 2009. b. Cath 01/2013 in setting of VT storm: obstructive distal Cx disease (small and terminates in the AV groove, unlikely to cause significant ischemia or electrical instability), nonobstructive RCA disease, EF 15-20%.   . Cerebrovascular accident [facility])    a. 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 Chronic systolic CHF (congestive heart failure) ([place])    a. Likely NICM (out of proportion to CAD). b. 2009 - EF 25-30% by echo, 01/2013: 15-20% by cath. c. 11/2014 Echo: EF 35-40%, Gr1 DD, mild MR, mod TR, PASP 76mmHg.  . CKD (chronic kidney disease), stage II   . Dyslipidemia   . Gout   . HTN (hypertension)   . Hypokalemia   . ICD (implantable cardiac defibrillator) in place   . Insulin dependent diabetes mellitus ([place])   . Knee pain, bilateral   . Lipoma   . Nonischemic cardiomyopathy ([place])    a.  11/2014 Echo: EF 35-40%, Gr1 DD, mild MR, mod TR, PASP 87mmHg.  . OSA (obstructive sleep apnea)    does not wear cpap  . PAF (paroxysmal atrial fibrillation) ([place])    a. Noted 05/2008 by EKG;  b. CHA2DS2VASc = 5-6-->coumadin.  . Paroxysmal VT ([place])    a. s/p St. Jude ICD 2007. b. H/o paroxysmal VT/VF including VT storm 12/2012 admission prompting amio initiation;  c. 01/2013 ICD upgrade SJM 1411-36Q Ellipse VR single lead ICD.  [name] Pulmonary HTN ([place])    a. Mild by cath 01/2013.    Past Surgical History:  Procedure Laterality Date  . CARDIAC CATHETERIZATION     Nonobstructive coronary disease 2009  . CARDIAC DEFIBRILLATOR PLACEMENT     ICD-St. Jude  . IMPLANTABLE CARDIOVERTER DEFIBRILLATOR (ICD) GENERATOR CHANGE N/A 01/15/2014   Procedure: ICD GENERATOR CHANGE;  Surgeon: Evans Lance, MD;  Location: [facility] CATH LAB;  Service: Cardiovascular;  Laterality: N/A;  . LEFT AND RIGHT HEART CATHETERIZATION WITH CORONARY ANGIOGRAM  N/A 01/03/2013   Procedure: LEFT AND RIGHT HEART CATHETERIZATION WITH CORONARY ANGIOGRAM;  Surgeon: Peter M Martinique, MD;  Location: [facility] CATH LAB;  Service: Cardiovascular;  Laterality: N/A;  . LIPOMA EXCISION       Home Medications:  Prior to Admission medications   Medication Sig Start Date End Date Taking? Authorizing Provider  acetaminophen (TYLENOL) 500 MG tablet Take 500-1,000 mg by mouth every 6 (six) hours as needed (for bilateral knee pain).   Yes [provider]  allopurinol (ZYLOPRIM) 300 MG tablet TAKE 1 TABLET BY MOUTH  DAILY Patient taking differently: Take 300 mg by mouth once a day 09/10/16  Yes Biagio Borg, MD  amiodarone (PACERONE) 200 MG tablet Take 1 tablet (200 mg total) by mouth daily. Patient taking differently: Take 200 mg by mouth at bedtime.  09/10/16  Yes Biagio Borg, MD  amLODipine (NORVASC) 10 MG tablet Take 1 tablet (10 mg total) by mouth daily. Patient taking differently: Take 10 mg by mouth at bedtime.  09/10/16  Yes Biagio Borg, MD  atorvastatin (LIPITOR) 80 MG tablet TAKE 1 TABLET BY MOUTH AT  BEDTIME Patient taking differently: Take 80 mg by mouth at bedtime 09/10/16  Yes Biagio Borg, MD  carvedilol (COREG) 25 MG tablet TAKE 2 TABLETS BY MOUTH 2  TIMES DAILY WITH A MEAL. Patient taking differently: Take 50 mg by mouth two times a day with a meal 07/19/17  Yes Biagio Borg, MD  Cholecalciferol (VITAMIN D-3 PO) Take 1 capsule by mouth daily.   Yes [provider]  furosemide (LASIX) 80 MG tablet TAKE 1 TABLET BY MOUTH  DAILY Patient taking differently: Take 80 mg by mouth once a day 09/10/16  Yes Biagio Borg, MD  hydrALAZINE (APRESOLINE) 50 MG tablet TAKE 1 TABLET (50 MG TOTAL) BY MOUTH EVERY 8 (EIGHT) HOURS. 06/15/16  Yes Minus Breeding, MD  Insulin Glargine (LANTUS SOLOSTAR) 100 UNIT/ML Solostar Pen INJECT 10 UNITS INTO THE SKIN AT BEDTIME. 06/09/17  Yes Biagio Borg, MD  isosorbide dinitrate (ISORDIL) 20 MG tablet TAKE 1 TABLET BY MOUTH 3   TIMES DAILY Patient taking differently: Take 20 mg by mouth three times a day 03/11/17  Yes Biagio Borg, MD  Multiple Vitamin (MULTIVITAMIN) capsule Take 1 capsule by mouth daily.    Yes [provider]  nitroGLYCERIN (NITROSTAT) 0.4 MG SL tablet PLACE 1 TABLET UNDER TONGUE EVERY 5 MINS X3 DOSES AS NEEDED FOR CHEST PAIN 07/12/17  Yes Biagio Borg, MD  potassium chloride SA (K-DUR,KLOR-CON) 20 MEQ tablet TAKE 2 TABLETS BY MOUTH  DAILY Patient taking differently: Take 40 mEq by mouth once a day 09/10/16  Yes Biagio Borg, MD  spironolactone (ALDACTONE) 25 MG tablet TAKE 1 TABLET BY MOUTH  DAILY Patient taking differently: Take 25 mg by mouth once a day 06/15/16  Yes Biagio Borg, MD  Turmeric 500  MG CAPS Take 1,000 mg by mouth daily.   Yes [provider]  warfarin (COUMADIN) 5 MG tablet TAKE 2 TABLETS SUN, TUES,  THURS, SAT AND TAKE 1  TABLET MON, WED, FRI OR AS  DIRECTED BY ANTICOAGULATION CLINIC Patient taking differently: Take 5-10 mg by mouth See admin instructions. Take 10 mg by mouth at bedtime on Sun/Tues/Thurs and 5 mg on Mon/Tues/Wed/Sat 07/02/17  Yes Biagio Borg, MD  ACCU-CHEK SOFTCLIX LANCETS lancets Use to help check blood sugar once a day Dx e11.9 08/20/15   Biagio Borg, MD  glucose blood (ACCU-CHEK SMARTVIEW) test strip 1 each by Other route daily. Use to check blood sugar once a day Dx e11.9 08/20/15   Biagio Borg, MD  Insulin Pen Needle (B-D ULTRAFINE III SHORT PEN) 31G X 8 MM MISC USE ONCE DAILY WITH INSULIN 07/12/17   Biagio Borg, MD  traMADol (ULTRAM) 50 MG tablet Take 1 tablet (50 mg total) by mouth every 8 (eight) hours as needed. Patient taking differently: Take 50 mg by mouth every 8 (eight) hours as needed (for pain).  01/19/17   Biagio Borg, MD    Inpatient Medications: Scheduled Meds: . [START ON 07/28/2017] allopurinol  300 mg Oral Daily  . [START ON 07/28/2017] amiodarone  200 mg Oral Daily  . [START ON 07/28/2017] aspirin EC  81 mg Oral Daily    . atorvastatin  80 mg Oral QHS  . carvedilol  50 mg Oral BID WC  . furosemide  40 mg Intravenous Q12H  . hydrALAZINE  50 mg Oral Q8H  . insulin aspart  0-15 Units Subcutaneous TID WC  . insulin aspart  0-5 Units Subcutaneous QHS  . insulin glargine  10 Units Subcutaneous Q2200  . isosorbide dinitrate  20 mg Oral TID  . sodium chloride flush  3 mL Intravenous Q12H   Continuous Infusions: . sodium chloride     PRN Meds: sodium chloride, acetaminophen, morphine injection, nitroGLYCERIN, ondansetron (ZOFRAN) IV, sodium chloride flush, traMADol  Allergies:   No Known Allergies  Social History:   Social History   Socioeconomic History  . Marital status: Married    Spouse name: Not on file  . Number of children: 2  . Years of education: Not on file  . Highest education level: Not on file  Occupational History  . Occupation: medically retired due to heart failure  Social Needs  . Financial resource strain: Not on file  . Food insecurity:    Worry: Not on file    Inability: Not on file  . Transportation needs:    Medical: Not on file    Non-medical: Not on file  Tobacco Use  . Smoking status: Former Smoker    Last attempt to quit: 05/04/1986    Years since quitting: 31.2  . Smokeless tobacco: Never Used  . Tobacco comment: Quit smoking 30 yrs ago. Smoked as teenager less than 1/2 ppd. Smoked x 4 years.  Substance and Sexual Activity  . Alcohol use: No    Comment: occasionally  . Drug use: No  . Sexual activity: Never  Lifestyle  . Physical activity:    Days per week: Not on file    Minutes per session: Not on file  . Stress: Not on file  Relationships  . Social connections:    Talks on phone: Not on file    Gets together: Not on file    Attends religious service: Not on file    Active member  of club or organization: Not on file    Attends meetings of clubs or organizations: Not on file    Relationship status: Not on file  . Intimate partner violence:    Fear of  current or ex partner: Not on file    Emotionally abused: Not on file    Physically abused: Not on file    Forced sexual activity: Not on file  Other Topics Concern  . Not on file  Social History Narrative  . Not on file    Family History:    Family History  Problem Relation Age of Onset  . Coronary artery disease Mother   . Heart attack Mother   . Heart attack Maternal Uncle   . Heart attack Maternal Grandmother      ROS:  Please see the history of present illness.   All other ROS reviewed and negative.     Physical Exam/Data:   Vitals:   07/27/17 1545 07/27/17 1600 07/27/17 1700 07/27/17 1730  BP:  106/73 110/67   Pulse: 64 63 65 66  Resp: (!) 28 20 12 16   Temp:      TempSrc:      SpO2: 90% 94% 93% 93%    Intake/Output Summary (Last 24 hours) at 07/27/2017 1819 Last data filed at 07/27/2017 1816 Gross per 24 hour  Intake 3 ml  Output 2450 ml  Net -2447 ml   There were no vitals filed for this visit.  Blood pressure is 123/89.  Heart rate is 64.  Respirations are 20.  O2 saturations 94%. There is no height or weight on file to calculate BMI.  General: Obese middle-aged gentleman in no acute distress. HEENT: normal Lymph: no adenopathy Neck: no JVD Endocrine:  No thryomegaly Vascular: No carotid bruits; FA pulses 2+ bilaterally without bruits  Cardiac: Regular rate normal S1-S2. Lungs:  clear to auscultation bilaterally, no wheezing, rhonchi or rales  Abd: soft, nontender, no hepatomegaly  Ext: Trace pitting edema, left greater than right Musculoskeletal:  No deformities, BUE and BLE strength normal and equal Skin: warm and dry  Neuro:  CNs 2-12 intact, no focal abnormalities noted Psych:  Normal affect   EKG:  The EKG was personally reviewed and demonstrates:   Normal sinus rhythm.  Poor R wave progression.  No acute ST or T wave changes. Telemetry:  Telemetry was personally reviewed and demonstrates: Normal sinus rhythm  Relevant CV  Studies:   Laboratory Data:  Chemistry Recent Labs  Lab 07/27/17 1114  NA 141  K 4.3  CL 106  CO2 27  GLUCOSE 142*  BUN 36*  CREATININE 1.89*  CALCIUM 8.7*  GFRNONAA 37*  GFRAA 43*  ANIONGAP 8    No results for input(s): PROT, ALBUMIN, AST, ALT, ALKPHOS, BILITOT in the last 168 hours. Hematology Recent Labs  Lab 07/27/17 1114  WBC 5.5  RBC 4.36  HGB 13.5  HCT 44.0  MCV 100.9*  MCH 31.0  MCHC 30.7  RDW 15.6*  PLT 227   Cardiac Enzymes Recent Labs  Lab 07/27/17 1609  TROPONINI 0.04*    Recent Labs  Lab 07/27/17 1144  TROPIPOC 0.03    BNP Recent Labs  Lab 07/27/17 1114  BNP 787.1*    DDimer No results for input(s): DDIMER in the last 168 hours.  Radiology/Studies:  Dg Chest 2 View  Result Date: 07/27/2017 CLINICAL DATA:  Increased shortness of breath with minimal exertion. EXAM: CHEST - 2 VIEW COMPARISON:  09/22/2016. FINDINGS: Cardiomegaly. Single lead pacer  unchanged. Mild vascular congestion without consolidation or frank edema. No effusion or pneumothorax. Similar appearance to priors. IMPRESSION: Mild vascular congestion without consolidation or frank edema. Cardiomegaly. Electronically Signed   By: Staci Righter M.D.   On: 07/27/2017 11:14    Assessment and Plan:   1. 1.  Exertional chest tightness: It is difficult to tell whether the patient is having GI symptoms or cardiac symptoms.  He complains of lots of GI problems that started several weeks ago.  The symptoms mainly include bloating and discomfort after eating.  He now states that he has some chest tightness with walking.  He states that eating food makes this worse but he is also had the same symptoms with walking. Troponin level is minimally elevated.  We will continue to cycle troponins.  If his troponin levels increase significantly, he will likely need a heart catheterization.    His INR is 2.5 so he likely cannot have a cath tomorrow.   In addition, his creatinine is higher - unclear  if this is progressive CHF or progression of his CKD.  He has a known stenosis in the terminal circumflex artery that could be causing some episodes of angina.  This lesion is quite distal and is not a good candidate for PCI.   I dont think a myoview would be all that beneficial because of his obesity and the presence of this known stenosis in the inferior wall.   If the troponin levels remain low we may be able to consider other options.  2.  Chronic systolic congestive heart failure: Patient has mild heart failure at present.  He has trace leg edema.  He clearly eats salty foods on a regular basis.  We had a good discussion about low salt diet. Pete echocardiogram has been ordered.  For questions or updates, please contact [place] Please consult [email or web address] for contact info under Cardiology/STEMI.   Signed, Mertie Moores, MD  07/27/2017 6:19 PM

## 2017-07-27 NOTE — Progress Notes (Signed)
ANTICOAGULATION CONSULT NOTE - Initial Consult  Pharmacy Consult:  [place] Indication: atrial fibrillation  No Known Allergies  Patient Measurements:     Vital Signs: Temp: 98.6 F (37 C) (03/26 1050) Temp Source: Oral (03/26 1050) BP: 96/68 (03/26 1535) Pulse Rate: 64 (03/26 1545)  Labs: Recent Labs    07/27/17 1114  HGB 13.5  HCT 44.0  PLT 227  LABPROT 27.0*  INR 2.52  CREATININE 1.89*    CrCl cannot be calculated (Unknown ideal weight.).   Medical History: Past Medical History:  Diagnosis Date  . CAD (coronary artery disease)    a. Initial nonobst by cath 2009. b. Cath 01/2013 in setting of VT storm: obstructive distal Cx disease (small and terminates in the AV groove, unlikely to cause significant ischemia or electrical instability), nonobstructive RCA disease, EF 15-20%.   . Cerebrovascular accident [facility])    a. 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 Chronic systolic CHF (congestive heart failure) ([place])    a. Likely NICM (out of proportion to CAD). b. 2009 - EF 25-30% by echo, 01/2013: 15-20% by cath. c. 11/2014 Echo: EF 35-40%, Gr1 DD, mild MR, mod TR, PASP 4mmHg.  . CKD (chronic kidney disease), stage II   . Dyslipidemia   . Gout   . HTN (hypertension)   . Hypokalemia   . ICD (implantable cardiac defibrillator) in place   . Insulin dependent diabetes mellitus ([place])   . Knee pain, bilateral   . Lipoma   . Nonischemic cardiomyopathy ([place])    a.  11/2014 Echo: EF 35-40%, Gr1 DD, mild MR, mod TR, PASP 47mmHg.  . OSA (obstructive sleep apnea)    does not wear cpap  . PAF (paroxysmal atrial fibrillation) ([place])    a. Noted 05/2008 by EKG;  b. CHA2DS2VASc = 5-6-->coumadin.  . Paroxysmal VT ([place])    a. s/p St. Jude ICD 2007. b. H/o paroxysmal VT/VF including VT storm 12/2012 admission prompting amio initiation;  c. 01/2013 ICD upgrade SJM 1411-36Q Ellipse VR single lead ICD.  [name] Pulmonary HTN ([place])    a. Mild by cath 01/2013.      Assessment: [name] with history  of Afib on Coumadin PTA.  Patient presented with SOB and Pharmacy consulted to initiate IV heparin for ACS.  INR currently therapeutic at 2.52; therefore, will hold off on starting IV heparin until INR < 2.  No bleeding reported.   Goal of Therapy:  Heparin level 0.3-0.7 units/ml Monitor platelets by anticoagulation protocol: Yes    Plan:  Daily PT / INR Start IV heparin when INR < 2 F/U updated weight   Novice Vrba D. Mina Marble, PharmD, BCPS Pager:  [phone] 07/27/2017, 4:03 PM

## 2017-07-27 NOTE — ED Notes (Signed)
Patient resting in bed comfortably.  No signs of distress.  Vitals stable

## 2017-07-27 NOTE — ED Notes (Signed)
Patient transported to [facility] by this RN on telemetry. No difficulties with transport, RN of 3E there to admit patient to floor.

## 2017-07-27 NOTE — Plan of Care (Signed)
  Problem: Health Behavior/Discharge Planning: Goal: Ability to manage health-related needs will improve Outcome: Progressing   Problem: Clinical Measurements: Goal: Ability to maintain clinical measurements within normal limits will improve Outcome: Progressing   Problem: Clinical Measurements: Goal: Respiratory complications will improve Outcome: Progressing   

## 2017-07-27 NOTE — Progress Notes (Signed)
Patient arrived to unit, patient has oxygen on. Patient does not complain of any pain. Patient updated on plan of care and safety precautions in reach.

## 2017-07-28 ENCOUNTER — Other Ambulatory Visit: Payer: Self-pay

## 2017-07-28 ENCOUNTER — Inpatient Hospital Stay (HOSPITAL_COMMUNITY): Payer: Medicare Other

## 2017-07-28 DIAGNOSIS — I5043 Acute on chronic combined systolic (congestive) and diastolic (congestive) heart failure: Secondary | ICD-10-CM

## 2017-07-28 DIAGNOSIS — R072 Precordial pain: Secondary | ICD-10-CM

## 2017-07-28 LAB — PROTIME-INR
INR: 2.56
Prothrombin Time: 27.3 seconds — ABNORMAL HIGH (ref 11.4–15.2)

## 2017-07-28 LAB — ECHOCARDIOGRAM COMPLETE
Height: 72 in
WEIGHTICAEL: 4528 [oz_av]

## 2017-07-28 LAB — GLUCOSE, CAPILLARY
GLUCOSE-CAPILLARY: 128 mg/dL — AB (ref 65–99)
GLUCOSE-CAPILLARY: 114 mg/dL — AB (ref 65–99)
GLUCOSE-CAPILLARY: 136 mg/dL — AB (ref 65–99)
Glucose-Capillary: 152 mg/dL — ABNORMAL HIGH (ref 65–99)
Glucose-Capillary: 127 mg/dL — ABNORMAL HIGH (ref 65–99)

## 2017-07-28 LAB — BASIC METABOLIC PANEL
Anion gap: 11 (ref 5–15)
BUN: 37 mg/dL — AB (ref 6–20)
CHLORIDE: 101 mmol/L (ref 101–111)
CO2: 30 mmol/L (ref 22–32)
Calcium: 8.5 mg/dL — ABNORMAL LOW (ref 8.9–10.3)
Creatinine, Ser: 1.93 mg/dL — ABNORMAL HIGH (ref 0.61–1.24)
GFR calc Af Amer: 41 mL/min — ABNORMAL LOW (ref >60)
GFR calc non Af Amer: 36 mL/min — ABNORMAL LOW (ref >60)
Glucose, Bld: 96 mg/dL (ref 65–99)
POTASSIUM: 5.1 mmol/L (ref 3.5–5.1)
Sodium: 142 mmol/L (ref 135–145)

## 2017-07-28 LAB — HIV ANTIBODY (ROUTINE TESTING W REFLEX): HIV Screen 4th Generation wRfx: NONREACTIVE

## 2017-07-28 LAB — TROPONIN I: TROPONIN I: 0.04 ng/mL — AB (ref <0.03)

## 2017-07-28 LAB — CBC
HCT: 42.9 % (ref 39.0–52.0)
Hemoglobin: 12.9 g/dL — ABNORMAL LOW (ref 13.0–17.0)
MCH: 30.2 pg (ref 26.0–34.0)
MCHC: 30.1 g/dL (ref 30.0–36.0)
MCV: 100.5 fL — ABNORMAL HIGH (ref 78.0–100.0)
Platelets: 216 10*3/uL (ref 150–400)
RBC: 4.27 MIL/uL (ref 4.22–5.81)
RDW: 15.3 % (ref 11.5–15.5)
WBC: 5.3 10*3/uL (ref 4.0–10.5)

## 2017-07-28 MED ORDER — CARVEDILOL 25 MG PO TABS
37.5000 mg | ORAL_TABLET | Freq: Two times a day (BID) | ORAL | Status: DC
  Administered 2017-07-28 – 2017-07-31 (×6): 37.5 mg via ORAL
  Filled 2017-07-28 (×6): qty 1

## 2017-07-28 MED ORDER — WARFARIN SODIUM 10 MG PO TABS
10.0000 mg | ORAL_TABLET | Freq: Once | ORAL | Status: AC
  Administered 2017-07-28: 10 mg via ORAL
  Filled 2017-07-28: qty 1

## 2017-07-28 MED ORDER — HYDRALAZINE HCL 25 MG PO TABS: 25.0000 mg | ORAL_TABLET | Freq: Three times a day (TID) | ORAL | Status: DC

## 2017-07-28 MED ORDER — WARFARIN - PHARMACIST DOSING INPATIENT
Freq: Every day | Status: DC
  Administered 2017-07-28: 18:00:00

## 2017-07-28 MED ORDER — CARVEDILOL 25 MG PO TABS: 37.5000 mg | ORAL_TABLET | Freq: Two times a day (BID) | ORAL | Status: DC

## 2017-07-28 MED ORDER — CARVEDILOL 25 MG PO TABS
50.0000 mg | ORAL_TABLET | Freq: Two times a day (BID) | ORAL | Status: DC
  Administered 2017-07-28: 50 mg via ORAL
  Filled 2017-07-28: qty 2

## 2017-07-28 NOTE — Progress Notes (Signed)
  Echocardiogram 2D Echocardiogram has been performed.  [name] [name] 07/28/2017, 11:50 AM

## 2017-07-28 NOTE — Progress Notes (Signed)
Patient blp 97/56 in right arm.

## 2017-07-28 NOTE — Progress Notes (Signed)
Progress Note  Patient Name: [name] Date of Encounter: 07/28/2017  Primary Cardiologist: Laury Deep   Subjective   [name] is a 62 y.o. male with a hx of chronic combined systolic and diastolic congestive heart failure, nonischemic cardia myopathy, ventricular tachycardia, mild coronary artery disease, CVA, paroxysmal atrial fibrillation who is being seen today for the evaluation of chest discomfort at the request of Dr. Lorin Mercy  He is diuresed well overnight.   He is -2.9 L so far during this admission. He is feeling quite a bit better this morning.   Troponin levels have remained very minimally elevated but the trend has been flat.   Inpatient Medications    Scheduled Meds: . allopurinol  300 mg Oral Daily  . amiodarone  200 mg Oral QHS  . aspirin EC  81 mg Oral Daily  . atorvastatin  80 mg Oral QHS  . carvedilol  50 mg Oral BID WC  . furosemide  40 mg Intravenous Q12H  . hydrALAZINE  25 mg Oral Q8H  . insulin aspart  0-15 Units Subcutaneous TID WC  . insulin aspart  0-5 Units Subcutaneous QHS  . insulin glargine  10 Units Subcutaneous Q2200  . isosorbide dinitrate  20 mg Oral TID  . sodium chloride flush  3 mL Intravenous Q12H   Continuous Infusions: . sodium chloride     PRN Meds: sodium chloride, acetaminophen, morphine injection, nitroGLYCERIN, ondansetron (ZOFRAN) IV, sodium chloride flush, traMADol   Vital Signs    Vitals:   07/27/17 2205 07/27/17 2239 07/28/17 0024 07/28/17 0544  BP: (!) 95/57 102/64 101/64 106/71  Pulse:   65 68  Resp:   18 16  Temp:   98 F (36.7 C) 98 F (36.7 C)  TempSrc:   Oral Oral  SpO2:   96% 95%  Weight:    283 lb (128.4 kg)  Height:        Intake/Output Summary (Last 24 hours) at 07/28/2017 0923 Last data filed at 07/28/2017 0849 Gross per 24 hour  Intake 963 ml  Output 3850 ml  Net -2887 ml   Filed Weights   07/27/17 2015 07/28/17 0544  Weight: 285 lb (129.3 kg) 283 lb (128.4 kg)    Telemetry      NSR  - Personally Reviewed  ECG     NSR  - Personally Reviewed  Physical Exam   GEN: No acute distress.   Neck: No JVD Cardiac: RRR, no murmurs, rubs, or gallops.  Respiratory: Clear to auscultation bilaterally. GI: Soft, nontender, non-distended  MS: No edema; No deformity. Neuro:  Nonfocal  Psych: Normal affect   Labs    Chemistry Recent Labs  Lab 07/27/17 1114 07/28/17 0320  NA 141 142  K 4.3 5.1  CL 106 101  CO2 27 30  GLUCOSE 142* 96  BUN 36* 37*  CREATININE 1.89* 1.93*  CALCIUM 8.7* 8.5*  GFRNONAA 37* 36*  GFRAA 43* 41*  ANIONGAP 8 11     Hematology Recent Labs  Lab 07/27/17 1114 07/28/17 0320  WBC 5.5 5.3  RBC 4.36 4.27  HGB 13.5 12.9*  HCT 44.0 42.9  MCV 100.9* 100.5*  MCH 31.0 30.2  MCHC 30.7 30.1  RDW 15.6* 15.3  PLT 227 216    Cardiac Enzymes Recent Labs  Lab 07/27/17 1609 07/27/17 2058 07/28/17 0320  TROPONINI 0.04* 0.04* 0.04*    Recent Labs  Lab 07/27/17 1144  TROPIPOC 0.03     BNP Recent Labs  Lab  07/27/17 1114  BNP 787.1*     DDimer No results for input(s): DDIMER in the last 168 hours.   Radiology    Dg Chest 2 View  Result Date: 07/27/2017 CLINICAL DATA:  Increased shortness of breath with minimal exertion. EXAM: CHEST - 2 VIEW COMPARISON:  09/22/2016. FINDINGS: Cardiomegaly. Single lead pacer unchanged. Mild vascular congestion without consolidation or frank edema. No effusion or pneumothorax. Similar appearance to priors. IMPRESSION: Mild vascular congestion without consolidation or frank edema. Cardiomegaly. Electronically Signed   By: Staci Righter M.D.   On: 07/27/2017 11:14    Cardiac Studies      Patient Profile     62 y.o. male with a history of combined systolic and diastolic congestive heart failure, ventricular tachycardia, ICD placement, paroxysmal atrial fibrillation.  He was seen in consultation for chest fullness.  Assessment & Plan    1.  Chest fullness: His troponin levels remain flat.   This is not consistent with an acute coronary syndrome.  I suspect this is due to his congestive heart failure in the setting of mild to moderate renal insufficiency. He is feeling quite a bit better after diuresing approximately 3 L. He admits to not being compliant with his diet and he admits that he eats more salt than he should. Would continue Lasix through the day. I think I would try him on torsemide 40 mg PO  a day instead of the Lasix 80 mg which he takes at home.  This might provide for better diuresis.  Okay to resume Coumadin for now.  I do not anticipate doing a heart catheterization  For questions or updates, please contact [place] Please consult [email or web address] for contact info under Cardiology/STEMI.      Signed, Mertie Moores, MD  07/28/2017, 9:23 AM

## 2017-07-28 NOTE — Progress Notes (Signed)
[facility] Progress Note  Patient: [name] OQH:[id number]   PCP: Biagio Borg, MD DOB: 16-Apr-1956   DOA: 07/27/2017   DOS: 07/28/2017   Date of Service: the patient was seen and examined on 07/28/2017  Subjective: Feeling better, continues to have short of breath and denies having any chest pain or chest tightness.  No nausea no vomiting.  Oral intake is better.  Ambulating in the hallway without any difficulty.  Brief hospital course: Pt. with PMH of HTN; afib on Coumadin; OSA not on CPAP; DM; AICD in place; HTN; HLD; stage 2 CKD; CHF (EF 54-65%, grade 1 diastolic dysfunction in 6/81); CVA; and CAD; admitted on 07/27/2017, presented with complaint of shortnes of breath, was found to have acute on chronic combined CHF . Currently further plan is IV diuresis.  Assessment and Plan: 1.  Acute on chronic combined systolic and diastolic CHF. S/P AICD Echocardiogram suggested EF of 20-25%, diffuse hypokinesis, LV hypertrophy with diastolic dysfunction. Also RV dysfunction. This appears to have worsened from prior echocardiogram in 2016. Cardiology consulted. More likely acute flareup is secondary to dietary indiscretion as well as poor compliance with medical management at home. Currently on 40 mg IV Lasix twice daily. Also on 81 mg aspirin, Isordil, Coreg, Lipitor. Patient cardiology assistance.  2.  History of CAD Troponin mildly elevated likely demand ischemia in the setting of acute CHF. Cardiology consulted do not feel that this is ischemia. Continue aspirin.  3.  History of A. fib, chronic without RVR. History of CVA. Continue rate control with beta-blocker. Also continue amiodarone. Coumadin resumed per pharmacy since no procedure is scheduled.  4.  OSA, not using CPAP. Pulmonary hypertension, secondary. RV dysfunction. RV dysfunction as well as pulmonary hypertension likely secondary to noncompliance with CPAP therapy at home. Continue as needed oxygen for  now.  5.  Type 2 diabetes mellitus. Continue sliding scale insulin 10 units of Lantus at home.  6.  Essential hypertension. Relative hypotension. Patient has history of uncontrolled hypertension as an outpatient. Regimen include 10 mg amlodipine, 50 mg Coreg, 80 mg Lasix, 50 mg hydralazine 3 times daily, 20 mg Isordil 3 times daily, 25 mg Aldactone. In the hospital patient is on 50 mg Coreg, Lasix 40 mg IV twice daily and 20 mg Isordil only and despite holding all other blood pressure medication patient is hypotensive. This suggest medical noncompliance as well as dietary indiscretion at home on patient's part. At present we will reduce the dose of Coreg from 50 mg twice daily to 37.5 mg twice daily and monitor.  Diet: cardiac and carb modified diet DVT Prophylaxis: on therapeutic anticoagulation.  Advance goals of care discussion: full code  Family Communication: no family was present at bedside, at the time of interview.   Disposition:  Discharge to home.  Consultants: cardiology Procedures: Echocardiogram   Antibiotics: Anti-infectives (From admission, onward)   None       Objective: Physical Exam: Vitals:   07/28/17 0024 07/28/17 0544 07/28/17 1232 07/28/17 1300  BP: 101/64 106/71 (!) 81/46 (!) 97/56  Pulse: 65 68 74   Resp: 18 16 18    Temp: 98 F (36.7 C) 98 F (36.7 C) 98.9 F (37.2 C)   TempSrc: Oral Oral Oral   SpO2: 96% 95% 100%   Weight:  128.4 kg (283 lb)    Height:        Intake/Output Summary (Last 24 hours) at 07/28/2017 1707 Last data filed at 07/28/2017 1333 Gross per 24 hour  Intake 1440 ml  Output 2350 ml  Net -910 ml   Filed Weights   07/27/17 2015 07/28/17 0544  Weight: 129.3 kg (285 lb) 128.4 kg (283 lb)   General: Alert, Awake and Oriented to Time, Place and Person. Appear in mild distress, affect appropriate Eyes: PERRL, Conjunctiva normal ENT: Oral Mucosa clear moist. Neck: difficult to assess  JVD, no Abnormal Mass Or  lumps Cardiovascular: S1 and S2 Present, no Murmur, Peripheral Pulses Present Respiratory: increased respiratory effort, Bilateral Air entry equal and Decreased, no use of accessory muscle, faint basal Crackles, no wheezes Abdomen: Bowel Sound present, Soft and no tenderness, no hernia Skin: no redness, no Rash, no induration Extremities: bilateral  Pedal edema, no calf tenderness Neurologic: Grossly no focal neuro deficit. Bilaterally Equal motor strength  Data Reviewed: CBC: Recent Labs  Lab 07/27/17 1114 07/28/17 0320  WBC 5.5 5.3  HGB 13.5 12.9*  HCT 44.0 42.9  MCV 100.9* 100.5*  PLT 227 322   Basic Metabolic Panel: Recent Labs  Lab 07/27/17 1114 07/28/17 0320  NA 141 142  K 4.3 5.1  CL 106 101  CO2 27 30  GLUCOSE 142* 96  BUN 36* 37*  CREATININE 1.89* 1.93*  CALCIUM 8.7* 8.5*    Liver Function Tests: No results for input(s): AST, ALT, ALKPHOS, BILITOT, PROT, ALBUMIN in the last 168 hours. No results for input(s): LIPASE, AMYLASE in the last 168 hours. No results for input(s): AMMONIA in the last 168 hours. Coagulation Profile: Recent Labs  Lab 07/27/17 1114 07/28/17 0320  INR 2.52 2.56   Cardiac Enzymes: Recent Labs  Lab 07/27/17 1609 07/27/17 2058 07/28/17 0320  TROPONINI 0.04* 0.04* 0.04*   BNP (last 3 results) No results for input(s): PROBNP in the last 8760 hours. CBG: Recent Labs  Lab 07/27/17 1701 07/27/17 2120 07/28/17 0735 07/28/17 1156 07/28/17 1658  GLUCAP 139* 128* 114* 152* 127*   Studies: No results found.  Scheduled Meds: . allopurinol  300 mg Oral Daily  . amiodarone  200 mg Oral QHS  . aspirin EC  81 mg Oral Daily  . atorvastatin  80 mg Oral QHS  . carvedilol  37.5 mg Oral BID WC  . furosemide  40 mg Intravenous Q12H  . insulin aspart  0-15 Units Subcutaneous TID WC  . insulin aspart  0-5 Units Subcutaneous QHS  . insulin glargine  10 Units Subcutaneous Q2200  . isosorbide dinitrate  20 mg Oral TID  . sodium chloride  flush  3 mL Intravenous Q12H  . warfarin  10 mg Oral ONCE-1800  . Warfarin - Pharmacist Dosing Inpatient   Does not apply q1800   Continuous Infusions: . sodium chloride     PRN Meds: sodium chloride, acetaminophen, morphine injection, nitroGLYCERIN, ondansetron (ZOFRAN) IV, sodium chloride flush, traMADol  Time spent: 35 minutes  Author: Berle Mull, MD [facility] Pager: [phone] 07/28/2017 5:07 PM  If 7PM-7AM, please contact night-coverage at [email or web address], password [facility]

## 2017-07-28 NOTE — Progress Notes (Addendum)
ANTICOAGULATION CONSULT NOTE - Initial Consult  Pharmacy Consult:  Resume Coumadin Indication: atrial fibrillation  No Known Allergies  Patient Measurements: Height: 6' (182.9 cm) Weight: 283 lb (128.4 kg) IBW/kg (Calculated) : 77.6   Vital Signs: Temp: 98 F (36.7 C) (03/27 0544) Temp Source: Oral (03/27 0544) BP: 106/71 (03/27 0544) Pulse Rate: 68 (03/27 0544)  Labs: Recent Labs    07/27/17 1114 07/27/17 1609 07/27/17 2058 07/28/17 0320  HGB 13.5  --   --  12.9*  HCT 44.0  --   --  42.9  PLT 227  --   --  216  LABPROT 27.0*  --   --  27.3*  INR 2.52  --   --  2.56  CREATININE 1.89*  --   --  1.93*  TROPONINI  --  0.04* 0.04* 0.04*    Estimated Creatinine Clearance: 55.7 mL/min (A) (by C-G formula based on SCr of 1.93 mg/dL (H)).   Medical History: Past Medical History:  Diagnosis Date  . CAD (coronary artery disease)    a. Initial nonobst by cath 2009. b. Cath 01/2013 in setting of VT storm: obstructive distal Cx disease (small and terminates in the AV groove, unlikely to cause significant ischemia or electrical instability), nonobstructive RCA disease, EF 15-20%.   . Cerebrovascular accident [facility])    a. 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 Chronic systolic CHF (congestive heart failure) ([place])    a. Likely NICM (out of proportion to CAD). b. 2009 - EF 25-30% by echo, 01/2013: 15-20% by cath. c. 11/2014 Echo: EF 35-40%, Gr1 DD, mild MR, mod TR, PASP 31mmHg.  . CKD (chronic kidney disease), stage II   . Dyslipidemia   . Gout   . HTN (hypertension)   . Hypokalemia   . ICD (implantable cardiac defibrillator) in place   . Insulin dependent diabetes mellitus ([place])   . Knee pain, bilateral   . Lipoma   . Nonischemic cardiomyopathy ([place])    a.  11/2014 Echo: EF 35-40%, Gr1 DD, mild MR, mod TR, PASP 67mmHg.  . OSA (obstructive sleep apnea)    does not wear cpap  . PAF (paroxysmal atrial fibrillation) ([name])    a. Noted 05/2008 by EKG;  b. CHA2DS2VASc = 5-6-->coumadin.   . Paroxysmal VT ([place])    a. s/p St. Jude ICD 2007. b. H/o paroxysmal VT/VF including VT storm 12/2012 admission prompting amio initiation;  c. 01/2013 ICD upgrade SJM 1411-36Q [place] VR single lead ICD.  [name] Pulmonary HTN ([place])    a. Mild by cath 01/2013.    Assessment: 52 YOF with history of Afib on Coumadin PTA.  Patient presented on 07/27/17 with SOB and Pharmacy consulted to initiate IV heparin for ACS.  INR therapeutic at 2.52 on admit 3/26.   Today INR = 2.56, remains therapeutic.  No bleeding reported.  home dose: 5mg  daily except 10mg  on Tu/Th/Su, last dose 3/25 (5mg  dose) - dose held 3/26  -continues on amiodarone 200mg  qHS as on PTA, thus not a new drug interaction. -on ASA 81mg  daily (new)  Goal of Therapy:  Heparin level 0.3-0.7 units/ml Monitor platelets by anticoagulation protocol: Yes    Plan:  Coumadin 10mg  x1 today Daily PT / INR Does ASA 81mg  daily need to continue? Not reported on PTA med list.  Nicole Cella, RPh Clinical Pharmacist Pager: [phone] 8A-4P [phone] 4P-10P [phone] [place] [phone] 07/28/2017, 10:04 AM

## 2017-07-28 NOTE — Progress Notes (Signed)
Patient is alert and oriented sitting on side of bed eating

## 2017-07-29 LAB — CBC
HCT: 42.6 % (ref 39.0–52.0)
Hemoglobin: 12.9 g/dL — ABNORMAL LOW (ref 13.0–17.0)
MCH: 30.6 pg (ref 26.0–34.0)
MCHC: 30.3 g/dL (ref 30.0–36.0)
MCV: 101.2 fL — ABNORMAL HIGH (ref 78.0–100.0)
PLATELETS: 232 10*3/uL (ref 150–400)
RBC: 4.21 MIL/uL — AB (ref 4.22–5.81)
RDW: 15.5 % (ref 11.5–15.5)
WBC: 6.6 10*3/uL (ref 4.0–10.5)

## 2017-07-29 LAB — BASIC METABOLIC PANEL
Anion gap: 10 (ref 5–15)
BUN: 35 mg/dL — AB (ref 6–20)
CALCIUM: 8.3 mg/dL — AB (ref 8.9–10.3)
CO2: 30 mmol/L (ref 22–32)
Chloride: 101 mmol/L (ref 101–111)
Creatinine, Ser: 1.97 mg/dL — ABNORMAL HIGH (ref 0.61–1.24)
GFR calc Af Amer: 40 mL/min — ABNORMAL LOW (ref >60)
GFR, EST NON AFRICAN AMERICAN: 35 mL/min — AB (ref >60)
Glucose, Bld: 111 mg/dL — ABNORMAL HIGH (ref 65–99)
Potassium: 4.7 mmol/L (ref 3.5–5.1)
SODIUM: 141 mmol/L (ref 135–145)

## 2017-07-29 LAB — GLUCOSE, CAPILLARY
Glucose-Capillary: 91 mg/dL (ref 65–99)
Glucose-Capillary: 131 mg/dL — ABNORMAL HIGH (ref 65–99)
Glucose-Capillary: 112 mg/dL — ABNORMAL HIGH (ref 65–99)
Glucose-Capillary: 118 mg/dL — ABNORMAL HIGH (ref 65–99)

## 2017-07-29 LAB — PROTIME-INR
INR: 2.1
PROTHROMBIN TIME: 23.4 s — AB (ref 11.4–15.2)

## 2017-07-29 LAB — MAGNESIUM: MAGNESIUM: 2.2 mg/dL (ref 1.7–2.4)

## 2017-07-29 MED ORDER — WARFARIN SODIUM 10 MG PO TABS
10.0000 mg | ORAL_TABLET | Freq: Once | ORAL | Status: AC
Start: 2017-07-29 — End: 2017-07-29
  Administered 2017-07-29: 10 mg via ORAL
  Filled 2017-07-29: qty 1

## 2017-07-29 NOTE — Progress Notes (Signed)
Pt placed on CPAP with full face mask for the night.  Pt tolerating well.  Will monitor progress.

## 2017-07-29 NOTE — Plan of Care (Signed)
  Problem: Education: Goal: Knowledge of General Education information will improve Outcome: Progressing   Problem: Health Behavior/Discharge Planning: Goal: Ability to manage health-related needs will improve Outcome: Progressing   Problem: Clinical Measurements: Goal: Respiratory complications will improve Outcome: Progressing

## 2017-07-29 NOTE — Consult Note (Signed)
   [facility] CM Inpatient Consult   07/29/2017  [name] November 22, 1955 [id number]  Referral received to assess for care management services.    Met with the patient regarding the benefits of [place] [facility] services.  Explained that [place] [facility] is a covered benefit of insurance [employer]. Review information for [facility] Care Management and a brochure was provided with contact information.  Explained that [place] [facility] does not interfere with or replace any services arranged by the inpatient care management staff.   Patient states, "I am a typical man, being hardheaded and not doing right. I will listen better to my wife and [name], my daughter...they stay on me eating right."   Patient declined services with [place] [facility] at this time. He states that he gets a monthly call from [place] at [employer] and was scheduled for his annual visit tomorrow but changed it because of 'ending up in the hospital."   Patient did accept a brochure and 24 hour nurse advise line.  Natividad Brood, RN BSN [name] Liaison  [phone] business mobile phone Toll free office [phone]

## 2017-07-29 NOTE — Progress Notes (Signed)
[facility] Progress Note  Patient: [name] JFH:[id number]   PCP: Biagio Borg, MD DOB: [birth year]   DOA: 07/27/2017   DOS: 07/29/2017   Date of Service: the patient was seen and examined on 07/29/2017  Subjective: Denies any acute complaint in the morning.  No nausea no vomiting.  Breathing is significantly better.  No chest pain.  Oral intake is better as well.  Swelling of the leg is getting better.  Later in the afternoon patient complained about having some chest tightness when his oxygen was decreased.  Improved with returning back to 2 L.  Brief hospital course: Pt. with PMH of HTN; afib on Coumadin; OSA not on CPAP; DM; AICD in place; HTN; HLD; stage 2 CKD; CHF (EF 93-73%, grade 1 diastolic dysfunction in 4/28); CVA; and CAD; admitted on 07/27/2017, presented with complaint of shortnes of breath, was found to have acute on chronic combined CHF . Currently further plan is IV diuresis.  Assessment and Plan: 1.  Acute on chronic combined systolic and diastolic CHF. S/P AICD Acute on chronic hypoxic respiratory failure. Echocardiogram suggested EF of 20-25%, diffuse hypokinesis, LV hypertrophy with diastolic dysfunction. Also RV dysfunction. This appears to have worsened from prior echocardiogram in 2016. Cardiology consulted. More likely acute flareup is secondary to dietary indiscretion as well as poor compliance with medical management at home. Currently on 40 mg IV Lasix twice daily. Also on 81 mg aspirin, Isordil, Coreg, Lipitor. Cardiology requesting to continue IV Lasix for now, appreciate input.  2.  History of CAD Troponin mildly elevated likely demand ischemia in the setting of acute CHF. Cardiology consulted do not feel that this is ischemia. Continue aspirin.  3.  History of A. fib, chronic without RVR. History of CVA. Continue rate control with beta-blocker. Also continue amiodarone. Coumadin resumed per pharmacy since no procedure is scheduled.  4.   OSA, not using CPAP. Pulmonary hypertension, secondary. RV dysfunction. RV dysfunction as well as pulmonary hypertension likely secondary to noncompliance with CPAP therapy at home.  Patient mentions that his CPAP mask as well as tubing worn out and he threw it away but he has a functioning CPAP.  Home health consulted for providing supply. Continue as needed oxygen for now.  5.  Type 2 diabetes mellitus. Continue sliding scale insulin 10 units of Lantus at home.  6.  Essential hypertension. Relative hypotension. Patient has history of uncontrolled hypertension as an outpatient. Regimen include 10 mg amlodipine, 50 mg Coreg, 80 mg Lasix, 50 mg hydralazine 3 times daily, 20 mg Isordil 3 times daily, 25 mg Aldactone. In the hospital patient is on 50 mg Coreg, Lasix 40 mg IV twice daily and 20 mg Isordil only and despite holding all other blood pressure medication patient is hypotensive. This suggest medical noncompliance as well as dietary indiscretion at home on patient's part. At present we will reduce the dose of Coreg from 50 mg twice daily to 37.5 mg twice daily and monitor.  7.  Fever. Patient had an elevated temperature during the morning.  At present denies any other acute complaint. Will monitor clinically for now.  Diet: cardiac diet DVT Prophylaxis: warfarin  Advance goals of care discussion: full code  Family Communication: no family was present at bedside, at the time of interview.   Disposition:  Discharge to home.  Consultants: cardiology  Procedures: Echocardiogram   Antibiotics: Anti-infectives (From admission, onward)   None       Objective: Physical Exam: Vitals:   07/29/17  4268 07/29/17 1100 07/29/17 1146 07/29/17 1153  BP: 114/68  (!) 93/46 97/65  Pulse: 74  70   Resp: 14  18   Temp: 99.1 F (37.3 C)  98.4 F (36.9 C)   TempSrc: Oral     SpO2: 95% (!) 79% 99% 97%  Weight:      Height:        Intake/Output Summary (Last 24 hours) at  07/29/2017 1639 Last data filed at 07/29/2017 1445 Gross per 24 hour  Intake 1323 ml  Output 1600 ml  Net -277 ml   Filed Weights   07/27/17 2015 07/28/17 0544 07/29/17 0525  Weight: 129.3 kg (285 lb) 128.4 kg (283 lb) 129.1 kg (284 lb 9.6 oz)   General: Alert, Awake and Oriented to Time, Place and Person. Appear in mild distress, affect appropriate Eyes: PERRL, Conjunctiva normal ENT: Oral Mucosa clear moist. Neck: no JVD, no Abnormal Mass Or lumps Cardiovascular: S1 and S2 Present, no Murmur, Peripheral Pulses Present Respiratory: normal respiratory effort, Bilateral Air entry equal and Decreased, no use of accessory muscle, Clear to Auscultation, no Crackles, no wheezes Abdomen: Bowel Sound present, Soft and no tenderness, no hernia Skin: no redness, no Rash, no induration Extremities: trace Pedal edema, n calf tenderness Neurologic: Grossly no focal neuro deficit. Bilaterally Equal motor strength  Data Reviewed: CBC: Recent Labs  Lab 07/27/17 1114 07/28/17 0320 07/29/17 0517  WBC 5.5 5.3 6.6  HGB 13.5 12.9* 12.9*  HCT 44.0 42.9 42.6  MCV 100.9* 100.5* 101.2*  PLT 227 216 341   Basic Metabolic Panel: Recent Labs  Lab 07/27/17 1114 07/28/17 0320 07/29/17 0517  NA 141 142 141  K 4.3 5.1 4.7  CL 106 101 101  CO2 27 30 30   GLUCOSE 142* 96 111*  BUN 36* 37* 35*  CREATININE 1.89* 1.93* 1.97*  CALCIUM 8.7* 8.5* 8.3*  MG  --   --  2.2    Liver Function Tests: No results for input(s): AST, ALT, ALKPHOS, BILITOT, PROT, ALBUMIN in the last 168 hours. No results for input(s): LIPASE, AMYLASE in the last 168 hours. No results for input(s): AMMONIA in the last 168 hours. Coagulation Profile: Recent Labs  Lab 07/27/17 1114 07/28/17 0320 07/29/17 0517  INR 2.52 2.56 2.10   Cardiac Enzymes: Recent Labs  Lab 07/27/17 1609 07/27/17 2058 07/28/17 0320  TROPONINI 0.04* 0.04* 0.04*   BNP (last 3 results) No results for input(s): PROBNP in the last 8760  hours. CBG: Recent Labs  Lab 07/28/17 1156 07/28/17 1658 07/28/17 2148 07/29/17 0753 07/29/17 1150  GLUCAP 152* 127* 136* 91 131*   Studies: No results found.  Scheduled Meds: . allopurinol  300 mg Oral Daily  . amiodarone  200 mg Oral QHS  . aspirin EC  81 mg Oral Daily  . atorvastatin  80 mg Oral QHS  . carvedilol  37.5 mg Oral BID WC  . furosemide  40 mg Intravenous Q12H  . insulin aspart  0-15 Units Subcutaneous TID WC  . insulin aspart  0-5 Units Subcutaneous QHS  . insulin glargine  10 Units Subcutaneous Q2200  . isosorbide dinitrate  20 mg Oral TID  . sodium chloride flush  3 mL Intravenous Q12H  . warfarin  10 mg Oral ONCE-1800  . Warfarin - Pharmacist Dosing Inpatient   Does not apply q1800   Continuous Infusions: . sodium chloride     PRN Meds: sodium chloride, acetaminophen, nitroGLYCERIN, ondansetron (ZOFRAN) IV, sodium chloride flush, traMADol  Time spent: 35  minutes  Author: Berle Mull, MD [facility] Pager: [phone] 07/29/2017 4:39 PM  If 7PM-7AM, please contact night-coverage at [email or web address], password [facility]

## 2017-07-29 NOTE — Progress Notes (Addendum)
Pt 02 weaning, stated that he need a new CPAP at home the hose is broke. [facility] to supply equipment.

## 2017-07-29 NOTE — Progress Notes (Signed)
Patient complaining of hand spasms/jumping, but no pain.

## 2017-07-29 NOTE — Progress Notes (Signed)
Pt experiencing chest pain non radiating. 02 sat 79% applied 02 at 4l 02 sat 99% pain has subsided.

## 2017-07-29 NOTE — Progress Notes (Signed)
Progress Note  Patient Name: [name] Date of Encounter: 07/29/2017  Primary Cardiologist: Percival Spanish /  Lovena Le    Subjective   62 y.o.malewith a hx of chronic combined systolic and diastolic congestive heart failure, nonischemic cardia myopathy, ventricular tachycardia, mild coronary artery disease, CVA, paroxysmal atrial fibrillationwho is being seen today for the evaluation of chest discomfortat the request of Dr. Lorin Mercy  He has continued to diurese.  He is net -3.1 L so far this admission.   Renal function is about the same .  Creatinine is 1.97 today .  Echo yesterday reveals severely reduced left ventricular systolic function with an ejection fraction of 20-25%.  He has grade 1 diastolic dysfunction.  Inpatient Medications    Scheduled Meds: . allopurinol  300 mg Oral Daily  . amiodarone  200 mg Oral QHS  . aspirin EC  81 mg Oral Daily  . atorvastatin  80 mg Oral QHS  . carvedilol  37.5 mg Oral BID WC  . furosemide  40 mg Intravenous Q12H  . insulin aspart  0-15 Units Subcutaneous TID WC  . insulin aspart  0-5 Units Subcutaneous QHS  . insulin glargine  10 Units Subcutaneous Q2200  . isosorbide dinitrate  20 mg Oral TID  . sodium chloride flush  3 mL Intravenous Q12H  . warfarin  10 mg Oral ONCE-1800  . Warfarin - Pharmacist Dosing Inpatient   Does not apply q1800   Continuous Infusions: . sodium chloride     PRN Meds: sodium chloride, acetaminophen, nitroGLYCERIN, ondansetron (ZOFRAN) IV, sodium chloride flush, traMADol   Vital Signs    Vitals:   07/29/17 0004 07/29/17 0525 07/29/17 0626 07/29/17 0735  BP: 119/65 128/72  114/68  Pulse: 74 79  74  Resp: 18 18  14   Temp: 98.7 F (37.1 C) (!) 101.6 F (38.7 C) 98.4 F (36.9 C) 99.1 F (37.3 C)  TempSrc: Oral Oral Oral Oral  SpO2: 94% 92%  95%  Weight:  284 lb 9.6 oz (129.1 kg)    Height:        Intake/Output Summary (Last 24 hours) at 07/29/2017 1000 Last data filed at 07/29/2017 0900 Gross per  24 hour  Intake 1320 ml  Output 1600 ml  Net -280 ml   Filed Weights   07/27/17 2015 07/28/17 0544 07/29/17 0525  Weight: 285 lb (129.3 kg) 283 lb (128.4 kg) 284 lb 9.6 oz (129.1 kg)    Telemetry     NSR  - Personally Reviewed  ECG     NSR  - Personally Reviewed  Physical Exam   GEN: No acute distress.   Neck: No JVD Cardiac: RRR, no murmurs, rubs, or gallops.  Respiratory: Clear to auscultation bilaterally. GI: Soft, nontender, non-distended  MS:  1-2 + edema  Neuro:  Nonfocal  Psych: Normal affect   Labs    Chemistry Recent Labs  Lab 07/27/17 1114 07/28/17 0320 07/29/17 0517  NA 141 142 141  K 4.3 5.1 4.7  CL 106 101 101  CO2 27 30 30   GLUCOSE 142* 96 111*  BUN 36* 37* 35*  CREATININE 1.89* 1.93* 1.97*  CALCIUM 8.7* 8.5* 8.3*  GFRNONAA 37* 36* 35*  GFRAA 43* 41* 40*  ANIONGAP 8 11 10      Hematology Recent Labs  Lab 07/27/17 1114 07/28/17 0320 07/29/17 0517  WBC 5.5 5.3 6.6  RBC 4.36 4.27 4.21*  HGB 13.5 12.9* 12.9*  HCT 44.0 42.9 42.6  MCV 100.9* 100.5* 101.2*  MCH 31.0  30.2 30.6  MCHC 30.7 30.1 30.3  RDW 15.6* 15.3 15.5  PLT 227 216 232    Cardiac Enzymes Recent Labs  Lab 07/27/17 1609 07/27/17 2058 07/28/17 0320  TROPONINI 0.04* 0.04* 0.04*    Recent Labs  Lab 07/27/17 1144  TROPIPOC 0.03     BNP Recent Labs  Lab 07/27/17 1114  BNP 787.1*     DDimer No results for input(s): DDIMER in the last 168 hours.   Radiology    Dg Chest 2 View  Result Date: 07/27/2017 CLINICAL DATA:  Increased shortness of breath with minimal exertion. EXAM: CHEST - 2 VIEW COMPARISON:  09/22/2016. FINDINGS: Cardiomegaly. Single lead pacer unchanged. Mild vascular congestion without consolidation or frank edema. No effusion or pneumothorax. Similar appearance to priors. IMPRESSION: Mild vascular congestion without consolidation or frank edema. Cardiomegaly. Electronically Signed   By: Staci Righter M.D.   On: 07/27/2017 11:14    Cardiac Studies       Patient Profile     62 y.o. male with hx of combined S/D CHF.  Assessment & Plan    1.  Acute on chronic combined systolic and diastolic congestive heart failure: He still has some persistent leg edema.  I would continue Lasix at least one more day.  His creatinine is very minimally higher but his overall stable. His left ventricular systolic function is markedly depressed.  He is not been pliant with his salt restriction and was volume overloaded.  2.  Chest fullness:  Likely due to CHF .   Symptoms have improved.   3.  Paroxysmal atrial fibrillation:  Is maintaining NSR   For questions or updates, please contact [place] Please consult [email or web address] for contact info under Cardiology/STEMI.      Signed, Mertie Moores, MD  07/29/2017, 10:00 AM

## 2017-07-29 NOTE — Discharge Instructions (Signed)

## 2017-07-29 NOTE — Progress Notes (Signed)
ANTICOAGULATION CONSULT NOTE  Pharmacy Consult:  Coumadin Indication: atrial fibrillation  No Known Allergies  Patient Measurements: Height: 6' (182.9 cm) Weight: 284 lb 9.6 oz (129.1 kg) IBW/kg (Calculated) : 77.6  Vital Signs: Temp: 99.1 F (37.3 C) (03/28 0735) Temp Source: Oral (03/28 0735) BP: 114/68 (03/28 0735) Pulse Rate: 74 (03/28 0735)  Labs: Recent Labs    07/27/17 1114 07/27/17 1609 07/27/17 2058 07/28/17 0320 07/29/17 0517  HGB 13.5  --   --  12.9* 12.9*  HCT 44.0  --   --  42.9 42.6  PLT 227  --   --  216 232  LABPROT 27.0*  --   --  27.3* 23.4*  INR 2.52  --   --  2.56 2.10  CREATININE 1.89*  --   --  1.93* 1.97*  TROPONINI  --  0.04* 0.04* 0.04*  --     Estimated Creatinine Clearance: 54.7 mL/min (A) (by C-G formula based on SCr of 1.97 mg/dL (H)).     Assessment: 51 YOF with history of Afib on Coumadin PTA.  Patient presented on 07/27/17 with SOB and Pharmacy consulted to initiate IV heparin for ACS.  INR therapeutic at 2.52 on admit 3/26 so IV heparin was not started right away.  No plan for cath so Coumadin resumed on 07/28/17 (missed 3/26 Coumadin dose).  INR currently therapeutic; no bleeding reported.    Home Coumadin dose: 5mg  daily except 10mg  on Tu/Th/Su   Goal of Therapy:  INR 2 - 3    Plan:  Coumadin 10mg  PO today  Daily PT / INR F/U Cards consult: ?need ASA, ACEi/ARB   Deforrest Bogle D. Mina Marble, PharmD, BCPS Pager:  [phone] 07/29/2017, 7:52 AM

## 2017-07-30 LAB — PROTIME-INR
INR: 1.94
PROTHROMBIN TIME: 22 s — AB (ref 11.4–15.2)

## 2017-07-30 LAB — CBC
HCT: 42.5 % (ref 39.0–52.0)
Hemoglobin: 12.7 g/dL — ABNORMAL LOW (ref 13.0–17.0)
MCH: 30 pg (ref 26.0–34.0)
MCHC: 29.9 g/dL — ABNORMAL LOW (ref 30.0–36.0)
MCV: 100.5 fL — ABNORMAL HIGH (ref 78.0–100.0)
PLATELETS: 206 10*3/uL (ref 150–400)
RBC: 4.23 MIL/uL (ref 4.22–5.81)
RDW: 14.8 % (ref 11.5–15.5)
WBC: 6.1 10*3/uL (ref 4.0–10.5)

## 2017-07-30 LAB — GLUCOSE, CAPILLARY
GLUCOSE-CAPILLARY: 107 mg/dL — AB (ref 65–99)
GLUCOSE-CAPILLARY: 158 mg/dL — AB (ref 65–99)
Glucose-Capillary: 110 mg/dL — ABNORMAL HIGH (ref 65–99)
Glucose-Capillary: 112 mg/dL — ABNORMAL HIGH (ref 65–99)

## 2017-07-30 LAB — BASIC METABOLIC PANEL
Anion gap: 9 (ref 5–15)
BUN: 35 mg/dL — AB (ref 6–20)
CO2: 30 mmol/L (ref 22–32)
Calcium: 8.5 mg/dL — ABNORMAL LOW (ref 8.9–10.3)
Chloride: 99 mmol/L — ABNORMAL LOW (ref 101–111)
Creatinine, Ser: 1.89 mg/dL — ABNORMAL HIGH (ref 0.61–1.24)
GFR calc non Af Amer: 37 mL/min — ABNORMAL LOW (ref >60)
GFR, EST AFRICAN AMERICAN: 43 mL/min — AB (ref >60)
Glucose, Bld: 88 mg/dL (ref 65–99)
POTASSIUM: 4.4 mmol/L (ref 3.5–5.1)
Sodium: 138 mmol/L (ref 135–145)

## 2017-07-30 MED ORDER — WARFARIN SODIUM 7.5 MG PO TABS
7.5000 mg | ORAL_TABLET | Freq: Once | ORAL | Status: AC
  Administered 2017-07-30: 7.5 mg via ORAL
  Filled 2017-07-30: qty 1

## 2017-07-30 MED ORDER — FUROSEMIDE 10 MG/ML IJ SOLN
40.0000 mg | Freq: Two times a day (BID) | INTRAMUSCULAR | Status: DC
  Administered 2017-07-30: 40 mg via INTRAVENOUS
  Filled 2017-07-30: qty 4

## 2017-07-30 MED ORDER — WARFARIN SODIUM 10 MG PO TABS: 10.0000 mg | ORAL_TABLET | Freq: Once | ORAL | Status: DC

## 2017-07-30 MED ORDER — FUROSEMIDE 80 MG PO TABS
80.0000 mg | ORAL_TABLET | Freq: Every day | ORAL | Status: DC
  Administered 2017-07-30 – 2017-07-31 (×2): 80 mg via ORAL
  Filled 2017-07-30 (×2): qty 1

## 2017-07-30 NOTE — Plan of Care (Signed)
  Problem: Education: Goal: Knowledge of General Education information will improve Outcome: Progressing   Problem: Health Behavior/Discharge Planning: Goal: Ability to manage health-related needs will improve Outcome: Progressing   Problem: Clinical Measurements: Goal: Ability to maintain clinical measurements within normal limits will improve Outcome: Progressing Goal: Will remain free from infection Outcome: Progressing Goal: Diagnostic test results will improve Outcome: Progressing Goal: Respiratory complications will improve Outcome: Progressing Goal: Cardiovascular complication will be avoided Outcome: Progressing   Problem: Activity: Goal: Risk for activity intolerance will decrease Outcome: Progressing   Problem: Nutrition: Goal: Adequate nutrition will be maintained Outcome: Progressing   Problem: Coping: Goal: Level of anxiety will decrease Outcome: Progressing   Problem: Elimination: Goal: Will not experience complications related to bowel motility Outcome: Progressing Goal: Will not experience complications related to urinary retention Outcome: Progressing   Problem: Pain Managment: Goal: General experience of comfort will improve Outcome: Progressing   Problem: Safety: Goal: Ability to remain free from injury will improve Outcome: Progressing   Problem: Skin Integrity: Goal: Risk for impaired skin integrity will decrease Outcome: Progressing   Problem: Education: Goal: Ability to demonstrate management of disease process will improve Outcome: Progressing Goal: Ability to verbalize understanding of medication therapies will improve Outcome: Progressing   Problem: Activity: Goal: Capacity to carry out activities will improve Outcome: Progressing   Problem: Cardiac: Goal: Ability to achieve and maintain adequate cardiopulmonary perfusion will improve Outcome: Progressing

## 2017-07-30 NOTE — Progress Notes (Addendum)
Progress Note  Patient Name: [name] Date of Encounter: 07/30/2017  Primary Cardiologist: Minus Breeding, MD   Subjective   Pt describes chest pain yesterday associated with a hypoxic episode related to OSA. Resolved with O2.  Inpatient Medications    Scheduled Meds: . allopurinol  300 mg Oral Daily  . amiodarone  200 mg Oral QHS  . aspirin EC  81 mg Oral Daily  . atorvastatin  80 mg Oral QHS  . carvedilol  37.5 mg Oral BID WC  . furosemide  40 mg Intravenous BID  . insulin aspart  0-15 Units Subcutaneous TID WC  . insulin aspart  0-5 Units Subcutaneous QHS  . insulin glargine  10 Units Subcutaneous Q2200  . isosorbide dinitrate  20 mg Oral TID  . sodium chloride flush  3 mL Intravenous Q12H  . warfarin  7.5 mg Oral ONCE-1800  . Warfarin - Pharmacist Dosing Inpatient   Does not apply q1800   Continuous Infusions: . sodium chloride     PRN Meds: sodium chloride, acetaminophen, nitroGLYCERIN, ondansetron (ZOFRAN) IV, sodium chloride flush, traMADol   Vital Signs    Vitals:   07/29/17 1153 07/29/17 2009 07/29/17 2200 07/30/17 0530  BP: 97/65 110/72 106/73 96/68  Pulse:  72 76 71  Resp:  18  18  Temp:  98 F (36.7 C)  98.2 F (36.8 C)  TempSrc:  Oral  Oral  SpO2: 97% 96%  92%  Weight:    285 lb (129.3 kg)  Height:        Intake/Output Summary (Last 24 hours) at 07/30/2017 1036 Last data filed at 07/30/2017 1000 Gross per 24 hour  Intake 343 ml  Output 1565 ml  Net -1222 ml   Filed Weights   07/28/17 0544 07/29/17 0525 07/30/17 0530  Weight: 283 lb (128.4 kg) 284 lb 9.6 oz (129.1 kg) 285 lb (129.3 kg)    Telemetry    Sinus rhythm - Personally Reviewed  ECG    No new tracings - Personally Reviewed  Physical Exam   GEN: No acute distress.   Neck: No JVD Cardiac: RRR, no murmurs, rubs, or gallops.  Respiratory: Clear to auscultation bilaterally. GI: Soft, nontender, non-distended  MS: trace edema; No deformity. Neuro:  Nonfocal  Psych:  Normal affect   Labs    Chemistry Recent Labs  Lab 07/28/17 0320 07/29/17 0517 07/30/17 0536  NA 142 141 138  K 5.1 4.7 4.4  CL 101 101 99*  CO2 30 30 30   GLUCOSE 96 111* 88  BUN 37* 35* 35*  CREATININE 1.93* 1.97* 1.89*  CALCIUM 8.5* 8.3* 8.5*  GFRNONAA 36* 35* 37*  GFRAA 41* 40* 43*  ANIONGAP 11 10 9      Hematology Recent Labs  Lab 07/28/17 0320 07/29/17 0517 07/30/17 0536  WBC 5.3 6.6 6.1  RBC 4.27 4.21* 4.23  HGB 12.9* 12.9* 12.7*  HCT 42.9 42.6 42.5  MCV 100.5* 101.2* 100.5*  MCH 30.2 30.6 30.0  MCHC 30.1 30.3 29.9*  RDW 15.3 15.5 14.8  PLT 216 232 206    Cardiac Enzymes Recent Labs  Lab 07/27/17 1609 07/27/17 2058 07/28/17 0320  TROPONINI 0.04* 0.04* 0.04*    Recent Labs  Lab 07/27/17 1144  TROPIPOC 0.03     BNP Recent Labs  Lab 07/27/17 1114  BNP 787.1*     DDimer No results for input(s): DDIMER in the last 168 hours.   Radiology    No results found.  Cardiac Studies  Echo 07/28/17: Study Conclusions - Left ventricle: The cavity size was mildly dilated. Wall   thickness was increased in a pattern of moderate LVH. Systolic   function was severely reduced. The estimated ejection fraction   was in the range of 20% to 25%. Diffuse hypokinesis. Doppler   parameters are consistent with abnormal left ventricular   relaxation (grade 1 diastolic dysfunction). - Aortic valve: There was no stenosis. - Aorta: Mildly dilated aortic root and ascending aorta. Aortic   root dimension: 40 mm (ED). Ascending aortic diameter: 43 mm (S). - Mitral valve: Mildly calcified annulus. There was no significant   regurgitation. - Left atrium: The atrium was mildly dilated. - Right ventricle: The cavity size was mildly dilated. Pacer wire   or catheter noted in right ventricle. Systolic function was   moderately reduced. - Right atrium: The atrium was mildly dilated. - Tricuspid valve: Peak RV-RA gradient (S): 32 mm Hg. - Pulmonary arteries: PA peak  pressure: 40 mm Hg (S). - Systemic veins: IVC measured 2.6 cm with > 50% respirophasic   variation, suggesting RA pressure 8 mmHg.  Impressions: - Mildly dilated LV with moderate LV hypertrophy. EF 20-25%,   diffuse hypokinesis. Mildly dilated RV with moderately decreased   systolic function. Mild pulmonary hypertension.  Patient Profile     62 y.o. male with a hx of chronic combined systolic and diastolic congestive heart failure, nonischemic cardia myopathy, ventricular tachycardia, mild coronary artery disease, CVA, paroxysmal atrial fibrillationwho is being seen today for the evaluation of chest discomfort.   Echo this admission reveals severely reduced left ventricular systolic function with an ejection fraction of 20-25%.  He has grade 1 diastolic dysfunction. He is diuresing.   Assessment & Plan    1. Acute on chronic combined systolic and diastolic heart failure He is diuresing on 40 mg IV lasix BID. He is overall net negative 4L with 1.6L urine output yesterday. His weight is stable at 285 lbs. Creatinine is 1.89 (1.97) and K is stable 4.4 (4.4). Doing well on coreg and imdur. Will need to wean supplemental oxygen. Transition to PO lasix this afternoon. Anticipate discharge tomorrow if on room air.   2. Paroxysmal Afib Continue amiodarone 200 mg daily. Continue coumadin for AC. He is in NSR on telemetry. No palpitations or chest pain.    3. OSA Pt states his CPAP machine broke and he needs new parts. His chest pain yesterday was associated with hypoxia on room air. Nursing attempted to wean his oxygen and he fell asleep. While asleep, he became hypoxic in the 70s and required [place]. CP resolved with O2. He has walked in the halls with adequate saturations on room air. Will not need supplemental O2 at home, but does need a new CPAP.    For questions or updates, please contact [place] Please consult [email or web address] for contact info under Cardiology/STEMI.       Signed, Tami Lin Duke, PA  07/30/2017, 10:36 AM    Attending Note:   The patient was seen and examined.  Agree with assessment and plan as noted above.  Changes made to the above note as needed.  Patient seen and independently examined with Doreene Adas, PA .   We discussed all aspects of the encounter. I agree with the assessment and plan as stated above.  1.  .  Acute on chronic combined systolic and diastolic congestive heart failure: Patient is net -4.0 L since admission.  His creatinine is relatively stable. His home dose  of diuretics with Lasix 80 mg in the morning with a as needed 40 mg in the evening if needed for fluid retention.  He is been noncompliant with his diet and has been eating too much salt.  2.  Paroxysmal atrial fibrillation: Continue amiodarone 200 mg a day.  He is on Coumadin.  Amiodarone dosing per Dr. Percival Spanish.   He should  be able to go home in the next day or so.   I have spent a total of 40 minutes with patient reviewing hospital  notes , telemetry, EKGs, labs and examining patient as well as establishing an assessment and plan that was discussed with the patient. > 50% of time was spent in direct patient care.    Thayer Headings, Brooke Bonito., MD, [facility] 07/30/2017, 1:14 PM [address]  [place] Pager [phone]

## 2017-07-30 NOTE — Progress Notes (Signed)
[facility] Progress Note  Patient: [name] NAT:[id number]   PCP: Biagio Borg, MD DOB: [birth year]   DOA: 07/27/2017   DOS: 07/30/2017   Date of Service: the patient was seen and examined on 07/30/2017  Subjective: Feeling better.  No nausea no vomiting.  No chest pain.  Brief hospital course: Pt. with PMH of HTN; afib on Coumadin; OSA not on CPAP; DM; AICD in place; HTN; HLD; stage 2 CKD; CHF (EF 42-70%, grade 1 diastolic dysfunction in 6/23); CVA; and CAD; admitted on 07/27/2017, presented with complaint of shortnes of breath, was found to have acute on chronic combined CHF . Currently further plan is IV diuresis.  Assessment and Plan: 1.  Acute on chronic combined systolic and diastolic CHF. S/P AICD Acute on chronic hypoxic respiratory failure. Echocardiogram suggested EF of 20-25%, diffuse hypokinesis, LV hypertrophy with diastolic dysfunction. Also RV dysfunction. This appears to have worsened from prior echocardiogram in 2016. Cardiology consulted. More likely acute flareup is secondary to dietary indiscretion as well as poor compliance with medical management at home. Initially on 40 mg IV Lasix twice daily.  He is to oral Lasix 80 mg twice daily. Also on 81 mg aspirin, Isordil, Coreg, Lipitor. Monitor for today on oral.  2.  History of CAD Troponin mildly elevated likely demand ischemia in the setting of acute CHF. Cardiology consulted do not feel that this is ischemia. Continue aspirin.  3.  History of A. fib, chronic without RVR. History of CVA. Continue rate control with beta-blocker. Also continue amiodarone. Coumadin resumed per pharmacy since no procedure is scheduled.  4.  OSA, not using CPAP. Pulmonary hypertension, secondary. RV dysfunction. RV dysfunction as well as pulmonary hypertension likely secondary to noncompliance with CPAP therapy at home.  Patient mentions that his CPAP mask as well as tubing worn out and he threw it away but he has a  functioning CPAP.  Home health consulted for providing supply. Continue as needed oxygen for now.  5.  Type 2 diabetes mellitus. Continue sliding scale insulin 10 units of Lantus at home.  6.  Essential hypertension. Relative hypotension. Patient has history of uncontrolled hypertension as an outpatient. Regimen include 10 mg amlodipine, 50 mg Coreg, 80 mg Lasix, 50 mg hydralazine 3 times daily, 20 mg Isordil 3 times daily, 25 mg Aldactone. In the hospital patient is on 50 mg Coreg, Lasix 40 mg IV twice daily and 20 mg Isordil only and despite holding all other blood pressure medication patient is hypotensive. This suggest medical noncompliance as well as dietary indiscretion at home on patient's part. At present we will reduce the dose of Coreg from 50 mg twice daily to 37.5 mg twice daily and monitor.  7.  Fever. Patient had an elevated temperature during the morning.  At present denies any other acute complaint. Will monitor clinically for now.  Diet: cardiac diet DVT Prophylaxis: warfarin  Advance goals of care discussion: full code  Family Communication: no family was present at bedside, at the time of interview.   Disposition:  Discharge to home.  Consultants: cardiology  Procedures: Echocardiogram   Antibiotics: Anti-infectives (From admission, onward)   None       Objective: Physical Exam: Vitals:   07/29/17 2200 07/30/17 0530 07/30/17 1100 07/30/17 1600  BP: 106/73 96/68 (!) 90/56 (!) 97/58  Pulse: 76 71 70 70  Resp:  18 19 20   Temp:  98.2 F (36.8 C) 98.4 F (36.9 C) 98.4 F (36.9 C)  TempSrc:  Oral Oral Oral  SpO2:  92% 91% 95%  Weight:  129.3 kg (285 lb)    Height:        Intake/Output Summary (Last 24 hours) at 07/30/2017 1923 Last data filed at 07/30/2017 1800 Gross per 24 hour  Intake 103 ml  Output 2445 ml  Net -2342 ml   Filed Weights   07/28/17 0544 07/29/17 0525 07/30/17 0530  Weight: 128.4 kg (283 lb) 129.1 kg (284 lb 9.6 oz) 129.3  kg (285 lb)   General: Alert, Awake and Oriented to Time, Place and Person. Appear in mild distress, affect appropriate Eyes: PERRL, Conjunctiva normal ENT: Oral Mucosa clear moist. Neck: no JVD, no Abnormal Mass Or lumps Cardiovascular: S1 and S2 Present, no Murmur, Peripheral Pulses Present Respiratory: normal respiratory effort, Bilateral Air entry equal and Decreased, no use of accessory muscle, Clear to Auscultation, no Crackles, no wheezes Abdomen: Bowel Sound present, Soft and no tenderness, no hernia Skin: no redness, no Rash, no induration Extremities: trace Pedal edema, n calf tenderness Neurologic: Grossly no focal neuro deficit. Bilaterally Equal motor strength  Data Reviewed: CBC: Recent Labs  Lab 07/27/17 1114 07/28/17 0320 07/29/17 0517 07/30/17 0536  WBC 5.5 5.3 6.6 6.1  HGB 13.5 12.9* 12.9* 12.7*  HCT 44.0 42.9 42.6 42.5  MCV 100.9* 100.5* 101.2* 100.5*  PLT 227 216 232 062   Basic Metabolic Panel: Recent Labs  Lab 07/27/17 1114 07/28/17 0320 07/29/17 0517 07/30/17 0536  NA 141 142 141 138  K 4.3 5.1 4.7 4.4  CL 106 101 101 99*  CO2 27 30 30 30   GLUCOSE 142* 96 111* 88  BUN 36* 37* 35* 35*  CREATININE 1.89* 1.93* 1.97* 1.89*  CALCIUM 8.7* 8.5* 8.3* 8.5*  MG  --   --  2.2  --     Liver Function Tests: No results for input(s): AST, ALT, ALKPHOS, BILITOT, PROT, ALBUMIN in the last 168 hours. No results for input(s): LIPASE, AMYLASE in the last 168 hours. No results for input(s): AMMONIA in the last 168 hours. Coagulation Profile: Recent Labs  Lab 07/27/17 1114 07/28/17 0320 07/29/17 0517 07/30/17 0536  INR 2.52 2.56 2.10 1.94   Cardiac Enzymes: Recent Labs  Lab 07/27/17 1609 07/27/17 2058 07/28/17 0320  TROPONINI 0.04* 0.04* 0.04*   BNP (last 3 results) No results for input(s): PROBNP in the last 8760 hours. CBG: Recent Labs  Lab 07/29/17 1722 07/29/17 2115 07/30/17 0732 07/30/17 1202 07/30/17 1732  GLUCAP 112* 118* 107* 110*  112*   Studies: No results found.  Scheduled Meds: . allopurinol  300 mg Oral Daily  . amiodarone  200 mg Oral QHS  . aspirin EC  81 mg Oral Daily  . atorvastatin  80 mg Oral QHS  . carvedilol  37.5 mg Oral BID WC  . furosemide  80 mg Oral Daily  . insulin aspart  0-15 Units Subcutaneous TID WC  . insulin aspart  0-5 Units Subcutaneous QHS  . insulin glargine  10 Units Subcutaneous Q2200  . isosorbide dinitrate  20 mg Oral TID  . sodium chloride flush  3 mL Intravenous Q12H  . Warfarin - Pharmacist Dosing Inpatient   Does not apply q1800   Continuous Infusions: . sodium chloride     PRN Meds: sodium chloride, acetaminophen, nitroGLYCERIN, ondansetron (ZOFRAN) IV, sodium chloride flush, traMADol  Time spent: 35 minutes  Author: Berle Mull, MD [facility] Pager: [phone] 07/30/2017 7:23 PM  If 7PM-7AM, please contact night-coverage at [email or web address], password [facility]

## 2017-07-30 NOTE — Progress Notes (Signed)
ANTICOAGULATION CONSULT NOTE  Pharmacy Consult:  Coumadin Indication: atrial fibrillation  No Known Allergies  Patient Measurements: Height: 6' (182.9 cm) Weight: 285 lb (129.3 kg) IBW/kg (Calculated) : 77.6  Vital Signs: Temp: 98.2 F (36.8 C) (03/29 0530) Temp Source: Oral (03/29 0530) BP: 96/68 (03/29 0530) Pulse Rate: 71 (03/29 0530)  Labs: Recent Labs    07/27/17 1114 07/27/17 1609 07/27/17 2058 07/28/17 0320 07/29/17 0517 07/30/17 0536  HGB 13.5  --   --  12.9* 12.9*  --   HCT 44.0  --   --  42.9 42.6  --   PLT 227  --   --  216 232  --   LABPROT 27.0*  --   --  27.3* 23.4* 22.0*  INR 2.52  --   --  2.56 2.10 1.94  CREATININE 1.89*  --   --  1.93* 1.97* 1.89*  TROPONINI  --  0.04* 0.04* 0.04*  --   --     Estimated Creatinine Clearance: 57.1 mL/min (A) (by C-G formula based on SCr of 1.89 mg/dL (H)).     Assessment: 41 YOF with history of Afib on Coumadin PTA.  Patient presented on 07/27/17 with SOB and Pharmacy consulted to initiate IV heparin for ACS.  INR therapeutic at 2.52 on admit 3/26 so IV heparin was not started right away.  No plan for cath so Coumadin resumed on 07/28/17 (missed 3/26 Coumadin dose).  INR decreased to 1.94 today, subtherapeutic, probably due to dose held on admission date 3/26; no bleeding reported.    Home Coumadin dose: 5mg  daily except 10mg  on Tu/Th/Su   Goal of Therapy:  INR 2 - 3    Plan:  Coumadin 7.5mg  PO today  Daily PT / INR   Nicole Cella, RPh Clinical Pharmacist Pager: [phone] 07/30/2017, 7:22 AM

## 2017-07-31 DIAGNOSIS — I480 Paroxysmal atrial fibrillation: Secondary | ICD-10-CM

## 2017-07-31 DIAGNOSIS — Z95810 Presence of automatic (implantable) cardiac defibrillator: Secondary | ICD-10-CM

## 2017-07-31 DIAGNOSIS — I200 Unstable angina: Secondary | ICD-10-CM

## 2017-07-31 LAB — BASIC METABOLIC PANEL
Anion gap: 7 (ref 5–15)
BUN: 34 mg/dL — ABNORMAL HIGH (ref 6–20)
CO2: 34 mmol/L — AB (ref 22–32)
CREATININE: 1.74 mg/dL — AB (ref 0.61–1.24)
Calcium: 8.3 mg/dL — ABNORMAL LOW (ref 8.9–10.3)
Chloride: 99 mmol/L — ABNORMAL LOW (ref 101–111)
GFR calc non Af Amer: 41 mL/min — ABNORMAL LOW (ref >60)
GFR, EST AFRICAN AMERICAN: 47 mL/min — AB (ref >60)
Glucose, Bld: 92 mg/dL (ref 65–99)
Potassium: 4.2 mmol/L (ref 3.5–5.1)
SODIUM: 140 mmol/L (ref 135–145)

## 2017-07-31 LAB — GLUCOSE, CAPILLARY
GLUCOSE-CAPILLARY: 93 mg/dL (ref 65–99)
Glucose-Capillary: 154 mg/dL — ABNORMAL HIGH (ref 65–99)

## 2017-07-31 LAB — CBC
HCT: 40.2 % (ref 39.0–52.0)
Hemoglobin: 12.1 g/dL — ABNORMAL LOW (ref 13.0–17.0)
MCH: 30.2 pg (ref 26.0–34.0)
MCHC: 30.1 g/dL (ref 30.0–36.0)
MCV: 100.2 fL — ABNORMAL HIGH (ref 78.0–100.0)
Platelets: 192 10*3/uL (ref 150–400)
RBC: 4.01 MIL/uL — ABNORMAL LOW (ref 4.22–5.81)
RDW: 14.6 % (ref 11.5–15.5)
WBC: 5.2 10*3/uL (ref 4.0–10.5)

## 2017-07-31 LAB — PROTIME-INR
INR: 2.05
PROTHROMBIN TIME: 22.9 s — AB (ref 11.4–15.2)

## 2017-07-31 MED ORDER — WARFARIN SODIUM 5 MG PO TABS: 5.0000 mg | ORAL_TABLET | Freq: Once | ORAL | Status: DC

## 2017-07-31 MED ORDER — CARVEDILOL 25 MG PO TABS: 25.0000 mg | ORAL_TABLET | Freq: Two times a day (BID) | ORAL | Status: DC

## 2017-07-31 MED ORDER — ASPIRIN 81 MG PO TBEC: 81.0000 mg | DELAYED_RELEASE_TABLET | Freq: Every day | ORAL | 0 refills | Status: DC

## 2017-07-31 MED ORDER — FUROSEMIDE 80 MG PO TABS: 80.0000 mg | ORAL_TABLET | Freq: Every day | ORAL | 0 refills | Status: DC

## 2017-07-31 MED ORDER — POTASSIUM CHLORIDE CRYS ER 20 MEQ PO TBCR: 20.0000 meq | EXTENDED_RELEASE_TABLET | Freq: Every day | ORAL | 0 refills | Status: DC

## 2017-07-31 MED ORDER — CARVEDILOL 25 MG PO TABS: 25.0000 mg | ORAL_TABLET | Freq: Two times a day (BID) | ORAL | 0 refills | Status: DC

## 2017-07-31 NOTE — Progress Notes (Signed)
Pt discharge instructions reviewed with pt. Pt verbalizes understanding. Pt belongings with pt. Pt home O2 delivered to room. Pt is not in distress. Pt's daughter is driving him home. Pt discharged via wheelchair.

## 2017-07-31 NOTE — Progress Notes (Signed)
SATURATION QUALIFICATIONS: (This note is used to comply with regulatory documentation for home oxygen)  Patient Saturations on Room Air at Rest = 93%  Patient Saturations on Room Air while Ambulating = 84  Patient Saturations on 2Liters of oxygen while Ambulating =  92

## 2017-07-31 NOTE — Progress Notes (Signed)
RN left voicemail and callback number for CM regarding pt's discharge needs.

## 2017-07-31 NOTE — Progress Notes (Signed)
Pharmacist Heart Failure Core Measure Documentation  Assessment: [name] has an EF documented as 20-25% on 07/28/17 by [name].  Rationale: Heart failure patients with left ventricular systolic dysfunction (LVSD) and an EF < 40% should be prescribed an angiotensin converting enzyme inhibitor (ACEI) or angiotensin receptor blocker (ARB) at discharge unless a contraindication is documented in the medical record.  This patient is not currently on an ACEI or ARB for HF.  This note is being placed in the record in order to provide documentation that a contraindication to the use of these agents is present for this encounter.  ACE Inhibitor or Angiotensin Receptor Blocker is contraindicated (specify all that apply)  []   ACEI allergy AND ARB allergy []   Angioedema []   Moderate or severe aortic stenosis []   Hyperkalemia [x]   Hypotension []   Renal artery stenosis [x]   Worsening renal function, preexisting renal disease or dysfunction   Romona Curls 07/31/2017 2:11 PM

## 2017-07-31 NOTE — Progress Notes (Signed)
SATURATION QUALIFICATIONS: (This note is used to comply with regulatory documentation for home oxygen)  Patient Saturations on Room Air at Rest = 93%  Patient Saturations on Room Air while Ambulating = 85%  Patient Saturations on 2 Liters of oxygen while Ambulating = 96%  Please briefly explain why patient needs home oxygen: pt desats with ambulation

## 2017-07-31 NOTE — Progress Notes (Signed)
ANTICOAGULATION CONSULT NOTE  Pharmacy Consult:  Coumadin Indication: atrial fibrillation  No Known Allergies  Patient Measurements: Height: 6' (182.9 cm) Weight: 276 lb 9.6 oz (125.5 kg) IBW/kg (Calculated) : 77.6  Vital Signs: Temp: 98.2 F (36.8 C) (03/30 0340) Temp Source: Axillary (03/30 0340) BP: 98/55 (03/30 0858) Pulse Rate: 68 (03/30 0858)  Labs: Recent Labs    07/29/17 0517 07/30/17 0536 07/31/17 0152  HGB 12.9* 12.7* 12.1*  HCT 42.6 42.5 40.2  PLT 232 206 192  LABPROT 23.4* 22.0* 22.9*  INR 2.10 1.94 2.05  CREATININE 1.97* 1.89* 1.74*    Estimated Creatinine Clearance: 61 mL/min (A) (by C-G formula based on SCr of 1.74 mg/dL (H)).  Assessment: 60 YOF with history of Afib on Coumadin PTA.  Patient presented on 07/27/17 with SOB, and Pharmacy consulted to initiate IV heparin for ACS. INR therapeutic at 2.52 on admit 3/26 so IV heparin was not started right away. No plan for cath, so Coumadin resumed on 07/28/17 (missed 3/26 Coumadin dose). Noted on amiodarone (pta dose).  INR back up to 2.05, therapeutic. no bleeding reported.    Home Coumadin dose: 5mg  daily except 10mg  on Tu/Th/Su  Goal of Therapy:  INR 2 - 3 Monitor platelets by anticoagulation protocol:  yes  Plan:  Coumadin 5mg  PO x 1 dose tonight Monitor daily INR, CBC, s/sx bleeding  Elicia Lamp, PharmD, BCPS Clinical Pharmacist Clinical phone for 07/31/2017 until 3:30pm: [phone] If after 3:30pm, please call main pharmacy at: [phone] 07/31/2017 11:57 AM

## 2017-07-31 NOTE — Progress Notes (Signed)
Progress Note  Patient Name: [name] Date of Encounter: 07/31/2017  Primary Cardiologist: Minus Breeding, MD   Subjective   No chest pain overnight. Weight down from 285 to 276 lbs. Diuresed 1.5L overnight. Creatinine improved with diuresis.   Inpatient Medications    Scheduled Meds: . allopurinol  300 mg Oral Daily  . amiodarone  200 mg Oral QHS  . aspirin EC  81 mg Oral Daily  . atorvastatin  80 mg Oral QHS  . carvedilol  25 mg Oral BID WC  . furosemide  80 mg Oral Daily  . insulin aspart  0-15 Units Subcutaneous TID WC  . insulin aspart  0-5 Units Subcutaneous QHS  . insulin glargine  10 Units Subcutaneous Q2200  . isosorbide dinitrate  20 mg Oral TID  . sodium chloride flush  3 mL Intravenous Q12H  . warfarin  5 mg Oral ONCE-1800  . Warfarin - Pharmacist Dosing Inpatient   Does not apply q1800   Continuous Infusions: . sodium chloride     PRN Meds: sodium chloride, acetaminophen, nitroGLYCERIN, ondansetron (ZOFRAN) IV, sodium chloride flush, traMADol   Vital Signs    Vitals:   07/31/17 0555 07/31/17 0600 07/31/17 0606 07/31/17 0858  BP:    (!) 98/55  Pulse:    68  Resp: 16     Temp:      TempSrc:      SpO2: 93% (!) 84% 96%   Weight:      Height:        Intake/Output Summary (Last 24 hours) at 07/31/2017 1247 Last data filed at 07/31/2017 0607 Gross per 24 hour  Intake 470 ml  Output 1230 ml  Net -760 ml   Filed Weights   07/29/17 0525 07/30/17 0530 07/31/17 0340  Weight: 284 lb 9.6 oz (129.1 kg) 285 lb (129.3 kg) 276 lb 9.6 oz (125.5 kg)    Telemetry    Sinus rhythm - Personally Reviewed  ECG    No new tracings - Personally Reviewed  Physical Exam   General appearance: alert and no distress Neck: no carotid bruit, no JVD and thyroid not enlarged, symmetric, no tenderness/mass/nodules Lungs: clear to auscultation bilaterally Heart: regular rate and rhythm Abdomen: soft, non-tender; bowel sounds normal; no masses,  no  organomegaly Extremities: extremities normal, atraumatic, no cyanosis or edema Pulses: 2+ and symmetric Skin: Skin color, texture, turgor normal. No rashes or lesions Neurologic: Grossly normal Psych: Pleasant   Labs    Chemistry Recent Labs  Lab 07/29/17 0517 07/30/17 0536 07/31/17 0152  NA 141 138 140  K 4.7 4.4 4.2  CL 101 99* 99*  CO2 30 30 34*  GLUCOSE 111* 88 92  BUN 35* 35* 34*  CREATININE 1.97* 1.89* 1.74*  CALCIUM 8.3* 8.5* 8.3*  GFRNONAA 35* 37* 41*  GFRAA 40* 43* 47*  ANIONGAP 10 9 7      Hematology Recent Labs  Lab 07/29/17 0517 07/30/17 0536 07/31/17 0152  WBC 6.6 6.1 5.2  RBC 4.21* 4.23 4.01*  HGB 12.9* 12.7* 12.1*  HCT 42.6 42.5 40.2  MCV 101.2* 100.5* 100.2*  MCH 30.6 30.0 30.2  MCHC 30.3 29.9* 30.1  RDW 15.5 14.8 14.6  PLT 232 206 192    Cardiac Enzymes Recent Labs  Lab 07/27/17 1609 07/27/17 2058 07/28/17 0320  TROPONINI 0.04* 0.04* 0.04*    Recent Labs  Lab 07/27/17 1144  TROPIPOC 0.03     BNP Recent Labs  Lab 07/27/17 1114  BNP 787.1*  DDimer No results for input(s): DDIMER in the last 168 hours.   Radiology    No results found.  Cardiac Studies   Echo 07/28/17: Study Conclusions - Left ventricle: The cavity size was mildly dilated. Wall   thickness was increased in a pattern of moderate LVH. Systolic   function was severely reduced. The estimated ejection fraction   was in the range of 20% to 25%. Diffuse hypokinesis. Doppler   parameters are consistent with abnormal left ventricular   relaxation (grade 1 diastolic dysfunction). - Aortic valve: There was no stenosis. - Aorta: Mildly dilated aortic root and ascending aorta. Aortic   root dimension: 40 mm (ED). Ascending aortic diameter: 43 mm (S). - Mitral valve: Mildly calcified annulus. There was no significant   regurgitation. - Left atrium: The atrium was mildly dilated. - Right ventricle: The cavity size was mildly dilated. Pacer wire   or catheter  noted in right ventricle. Systolic function was   moderately reduced. - Right atrium: The atrium was mildly dilated. - Tricuspid valve: Peak RV-RA gradient (S): 32 mm Hg. - Pulmonary arteries: PA peak pressure: 40 mm Hg (S). - Systemic veins: IVC measured 2.6 cm with > 50% respirophasic   variation, suggesting RA pressure 8 mmHg.  Impressions: - Mildly dilated LV with moderate LV hypertrophy. EF 20-25%,   diffuse hypokinesis. Mildly dilated RV with moderately decreased   systolic function. Mild pulmonary hypertension.  Patient Profile     62 y.o. male with a hx of chronic combined systolic and diastolic congestive heart failure, nonischemic cardia myopathy, ventricular tachycardia, mild coronary artery disease, CVA, paroxysmal atrial fibrillationwho is being seen today for the evaluation of chest discomfort.   Echo this admission reveals severely reduced left ventricular systolic function with an ejection fraction of 20-25%.  He has grade 1 diastolic dysfunction. He is diuresing.   Assessment & Plan    1. Acute on chronic combined systolic and diastolic heart failure He is diuresing on 40 mg IV lasix BID. He is overall net negative 4L with 1.6L urine output yesterday. His weight is stable at 285 lbs. Creatinine is 1.89 (1.97) and K is stable 4.4 (4.4). Doing well on coreg and imdur. Will need to wean supplemental oxygen. Transition to PO lasix this afternoon.  - stable on po lasix, creatinine improving.  2. Paroxysmal Afib Continue amiodarone 200 mg daily. Continue coumadin for AC. He is in NSR on telemetry. No palpitations or chest pain.   3. OSA Pt states his CPAP machine broke and he needs new parts. His chest pain yesterday was associated with hypoxia on room air. Nursing attempted to wean his oxygen and he fell asleep. While asleep, he became hypoxic in the 70s and required [place]. CP resolved with O2. He has walked in the halls with adequate saturations on room air. Will not need  supplemental O2 at home, but does need a new CPAP.   Ok for d.c today from a cardiac standpoint.  For questions or updates, please contact [place] Please consult [email or web address] for contact info under Cardiology/STEMI.   Pixie Casino, MD, [facility], [place] Director of the [facility]  Cardiovascular [facility] Clinic Diplomate of the American Board of Clinical Lipidology Attending Cardiologist  Direct Dial: [phone]  Fax: [phone]  Website:  [email or web address]  Pixie Casino, MD  07/31/2017, 12:47 PM

## 2017-07-31 NOTE — Care Management Note (Signed)
Case Management Note  Patient Details  Name: SARGENT MANKEY MRN: [id number] Date of Birth: 08-26-1955  Subjective/Objective: Pt presented for SOB-Acute on chronic combined systolic and diastolic CHF. PTA Independent from home. Plan for home on 02. CM did discuss DME companies and pt wants to use [facility]. E-tank to be delivered to room with the [employer]. Concentrator to be delivered to home.                    Action/Plan: No further home needs identified at this time.   Expected Discharge Date:                  Expected Discharge Plan:  Home/Self Care  In-House Referral:  NA  Discharge planning Services  CM Consult  Post Acute Care Choice:  Durable Medical Equipment Choice offered to:  Patient  DME Arranged:  Oxygen DME Agency:  [place]:  NA [place] Agency:  NA  Status of Service:  Completed, signed off  If discussed at [place] of Stay Meetings, dates discussed:    Additional Comments:  Bethena Roys, RN 07/31/2017, 11:49 AM

## 2017-08-01 ENCOUNTER — Encounter: Payer: Self-pay | Admitting: Cardiology

## 2017-08-01 NOTE — Progress Notes (Signed)
HPI The patient presents for follow up of CHF.  Since he was last in the office he was again in the hospital for volume overload thought to be in part related to medical non adherence.  I reviewed these records for this visit.   Echo demonstrated the EF to be 20 - 25% and lower than previous.   Of note he was noted to have a non functioning CPAP at that appt.   The patient has a long history of a myopathy.  However, I have not seen him in the clinic since 2017. He has had difficult to control HTN.  He has had CAD.  However, it was felt that his cardiomyopathy was out of proportion to his coronary disease.  It does not sound like he is always been compliant with his medications.    Since coming out of the hospital.  He is on oxygen.  His weights have been creeping up just slightly.  He said that he was discharged 273 pounds and is up to 276.8 t.  He denies any new PND or orthopnea.  He has had no new palpitations, presyncope or syncope.  He has had no new edema.  He is trying to restrict his salt in his fluids.  Of note in the hospital as his blood pressure was lower they stopped his hydralazine, stop his Imdur, stopped his spironolactone his beta-blocker.  No Known Allergies  Current Outpatient Medications  Medication Sig Dispense Refill  . ACCU-CHEK SOFTCLIX LANCETS lancets Use to help check blood sugar once a day Dx e11.9 100 each 3  . acetaminophen (TYLENOL) 500 MG tablet Take 500-1,000 mg by mouth every 6 (six) hours as needed (for bilateral knee pain).    [name] allopurinol (ZYLOPRIM) 300 MG tablet TAKE 1 TABLET BY MOUTH  DAILY (Patient taking differently: Take 300 mg by mouth once a day) 90 tablet 3  . amiodarone (PACERONE) 200 MG tablet Take 1 tablet (200 mg total) by mouth daily. (Patient taking differently: Take 200 mg by mouth at bedtime. ) 90 tablet 3  . aspirin EC 81 MG EC tablet Take 1 tablet (81 mg total) by mouth daily. 30 tablet 0  . atorvastatin (LIPITOR) 80 MG tablet TAKE 1  TABLET BY MOUTH AT  BEDTIME (Patient taking differently: Take 80 mg by mouth at bedtime) 90 tablet 3  . carvedilol (COREG) 25 MG tablet Take 1 tablet (25 mg total) by mouth 2 (two) times daily with a meal. 60 tablet 0  . Cholecalciferol (VITAMIN D-3 PO) Take 1 capsule by mouth daily.    . furosemide (LASIX) 80 MG tablet Take 1 tablet (80 mg total) by mouth daily. 90 tablet 0  . glucose blood (ACCU-CHEK SMARTVIEW) test strip 1 each by Other route daily. Use to check blood sugar once a day Dx e11.9 100 each 3  . Insulin Glargine (LANTUS SOLOSTAR) 100 UNIT/ML Solostar Pen INJECT 10 UNITS INTO THE SKIN AT BEDTIME. 15 pen 2  . Insulin Pen Needle (B-D ULTRAFINE III SHORT PEN) 31G X 8 MM MISC USE ONCE DAILY WITH INSULIN 30 each 2  . nitroGLYCERIN (NITROSTAT) 0.4 MG SL tablet PLACE 1 TABLET UNDER TONGUE EVERY 5 MINS X3 DOSES AS NEEDED FOR CHEST PAIN 25 tablet 11  . potassium chloride SA (K-DUR,KLOR-CON) 20 MEQ tablet Take 1 tablet (20 mEq total) by mouth daily. 30 tablet 0  . traMADol (ULTRAM) 50 MG tablet Take 1 tablet (50 mg total) by mouth every 8 (eight) hours  as needed. (Patient taking differently: Take 50 mg by mouth every 8 (eight) hours as needed (for pain). ) 60 tablet 2  . Turmeric 500 MG CAPS Take 1,000 mg by mouth daily.    [name] warfarin (COUMADIN) 5 MG tablet TAKE 2 TABLETS SUN, TUES,  THURS, SAT AND TAKE 1  TABLET MON, WED, FRI OR AS  DIRECTED BY ANTICOAGULATION CLINIC (Patient taking differently: Take 5-10 mg by mouth See admin instructions. Take 10 mg by mouth at bedtime on Sun/Tues/Thurs and 5 mg on Mon/Tues/Wed/Sat) 180 tablet 1   No current facility-administered medications for this visit.     Past Medical History:  Diagnosis Date  . CAD (coronary artery disease)    a. Initial nonobst by cath 2009. b. Cath 01/2013 in setting of VT storm: obstructive distal Cx disease (small and terminates in the AV groove, unlikely to cause significant ischemia or electrical instability), nonobstructive  RCA disease, EF 15-20%.   . Cerebrovascular accident [facility])    a. 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 Chronic systolic CHF (congestive heart failure) ([place])    a. Likely NICM (out of proportion to CAD). b. 2009 - EF 25-30% by echo, 01/2013: 15-20% by cath. c. 11/2014 Echo: EF 35-40%, Gr1 DD, mild MR, mod TR, PASP 55mmHg.  . CKD (chronic kidney disease), stage II   . Dyslipidemia   . Gout   . HTN (hypertension)   . Hypokalemia   . ICD (implantable cardiac defibrillator) in place   . Insulin dependent diabetes mellitus ([place])   . Lipoma   . Nonischemic cardiomyopathy ([place])    a.  11/2014 Echo: EF 35-40%, Gr1 DD, mild MR, mod TR, PASP 26mmHg.  . OSA (obstructive sleep apnea)    does not wear cpap  . PAF (paroxysmal atrial fibrillation) ([place])    a. Noted 05/2008 by EKG;  b. CHA2DS2VASc = 5-6-->coumadin.  . Paroxysmal VT (Uncertain)    a. s/p St. Jude ICD 2007. b. H/o paroxysmal VT/VF including VT storm 12/2012 admission prompting amio initiation;  c. 01/2013 ICD upgrade SJM [id number] Ellipse VR single lead ICD.  [name] Pulmonary HTN ([place])    a. Mild by cath 01/2013.    Past Surgical History:  Procedure Laterality Date  . CARDIAC CATHETERIZATION     Nonobstructive coronary disease 2009  . CARDIAC DEFIBRILLATOR PLACEMENT     ICD-St. Jude  . IMPLANTABLE CARDIOVERTER DEFIBRILLATOR (ICD) GENERATOR CHANGE N/A 01/15/2014   Procedure: ICD GENERATOR CHANGE;  Surgeon: Evans Lance, MD;  Location[facility] CATH LAB;  Service: Cardiovascular;  Laterality: N/A;  . LEFT AND RIGHT HEART CATHETERIZATION WITH CORONARY ANGIOGRAM N/A 01/03/2013   Procedure: LEFT AND RIGHT HEART CATHETERIZATION WITH CORONARY ANGIOGRAM;  Surgeon: Peter M Martinique, MD;  Location: [facility] CATH LAB;  Service: Cardiovascular;  Laterality: N/A;  . LIPOMA EXCISION     ROS:   Knee pain that limits his mobility.  Otherwise as stated in the HPI and negative for all other systems.  PHYSICAL EXAM BP 118/84   Pulse 78   Ht 6' (1.829 m)   Wt 287 lb 6.4 oz (130.4  kg)   SpO2 (!) 83%   BMI 38.98 kg/m   GENERAL:  Well appearing NECK:  No jugular venous distention, waveform within normal limits, carotid upstroke brisk and symmetric, no bruits, no thyromegaly LUNGS:  Clear to auscultation bilaterally CHEST:    Well healed ICD pocket.    HEART:  PMI not displaced or sustained,S1 and S2 within normal limits, no S3,  no S4, no clicks, no rubs, no murmurs ABD:  Flat, positive bowel sounds normal in frequency in pitch, no bruits, no rebound, no guarding, no midline pulsatile mass, no hepatomegaly, no splenomegaly EXT:  2 plus pulses throughout, no edema, no cyanosis no clubbing   EKG:   NA  Lab Results  Component Value Date   TSH 1.540 06/28/2017   ALT 24 06/28/2017   AST 19 06/28/2017   ALKPHOS 80 06/28/2017   BILITOT 1.3 (H) 06/28/2017   PROT 7.2 06/28/2017   ALBUMIN 4.2 06/28/2017    ASSESSMENT AND PLAN  Congestive heart failure, systolic-  His new baseline weight at home has been about 276 lbs.   he seems to be euvolemic at this point.  He does have some renal insufficiency his creatinine being 1.89 at discharge.  He might tolerate a low dose ACE inhibitor.  I would move very carefully on this with close follow-up of his renal function and check a basic metabolic profile again prior to initiating this.  Consider this when he comes back for follow-up.  In the future I will consider PYP scanning to screen for amyloid  HYPERTENSION -  His blood pressures is controlled.  At this point I am not going to make any change to his medications as he is only been out of the hospital a couple of days.  However, we will need to try to titrate his medications in the future   Atrial fibrillation-  He is had no symptomatic paroxysms.  He will continue with the meds as listed.  He tolerates anticoagulation.   CKD III - Creat was as above.  Follow with BMET at next appt.    HYPERLIPIDEMIA - His LDL was recently 26.  No change in therapy.   ICD - He is up  to date with follow up.   OBESITY - He will stop eating bread.  DM - A1c was 5.7.  No change in therapy.

## 2017-08-01 NOTE — Discharge Summary (Signed)
[facility] Discharge Summary   Patient: [name] RSW:[id number]   PCP: Biagio Borg, MD DOB: July 21, 1955   Date of admission: 07/27/2017   Date of discharge: 07/31/2017   Discharge Diagnoses:  Principal Problem:   Ischemic chest pain Active Problems:   Hyperlipidemia   Hypertensive heart disease   Atrial fibrillation (HCC)   Automatic implantable cardioverter-defibrillator in situ   Long term (current) use of anticoagulants   CKD (chronic kidney disease), stage II   Type 2 diabetes mellitus ([place])   Acute on chronic combined systolic (congestive) and diastolic (congestive) heart failure ([place])   Admitted From: home Disposition:  home  Recommendations for Outpatient Follow-up:  1. follow-up with PCP as well as cardiology in 1 week.  Follow-up [place].   Why:  Oxygen- [facility] to deliver portable E-tank to room prior to d/c.  Contact information: [place] [address] [phone]        Biagio Borg, MD. Schedule an appointment as soon as possible for a visit in 1 week.   Specialties:  Internal Medicine, Radiology Contact information: [address] [place] [address] [phone]          Diet recommendation: Cardiac diet carb modified  Activity: The patient is advised to gradually reintroduce usual activities.  Discharge Condition: good  Code Status: Full code  History of present illness: As per the H and P dictated on admission, "[name] is a 62 y.o. male with medical history significant of pulmonary HTN; afib on Coumadin; OSA not on CPAP; DM; AICD in place; HTN; HLD; stage 2 CKD; CHF (EF 10-17%, grade 1 diastolic dysfunction in 5/10); CVA; and CAD presenting with SOB.   Several weeks ago, he felt SOB.   He thought it was gas and took Gas X without improvement.  He took a laxative and still didn't get better.  He held out for 2 weeks but still got worse.  If he eats or drinks  something, he feels like someone is filling his belly with air that goes up to his chest.  He can't walk more than 5 feet without feeling tight.  Last night was his worst night yet.  He couldn't breathe and felt himself calling for oxygen.  He eventually fell asleep.  He drank OJ when he woke up today and took some Tylenol (knee pain).  He walked a few steps and had to sit down because it felt like someone had filled him up with air and he felt tightness around his chest.  Occasional chest pain, for which he takes NTG - last use in the ER and it helped to ease the tension in his chest.  Increased cough compared to usual, but nonproductive.  +LE edema, chronic and unchanged.  He has a scale at home and weight has been fluctuating - 277 this AM which is similar to his usual."  Hospital Course:  Summary of his active problems in the hospital is as following. 1.Acute on chronic combined systolic and diastolic CHF. S/P AICD Acute on chronic hypoxic respiratory failure. Echocardiogram suggested EF of 20-25%, diffuse hypokinesis, LV hypertrophy with diastolic dysfunction. Also RV dysfunction. This appears to have worsened from prior echocardiogram in 2016. Cardiology consulted. More likely acute flareup is secondary to dietary indiscretion as well as poor compliance with medical management at home. Initially on 40 mg IV Lasix twice daily.  He is to oral Lasix  80 mg twice daily. Also on 81 mg aspirin, Isordil, Coreg, Lipitor.  2.History of CAD Troponin mildly elevated likely demand ischemia in the setting of acute CHF. Cardiology consulted do not feel that this is ischemia. Continue aspirin.  3.History of A. fib, chronic without RVR. History of CVA. Continue rate control with beta-blocker. Also continue amiodarone. Coumadin resumed per pharmacy since no procedure is scheduled.  4.OSA, not using CPAP. Pulmonary hypertension, secondary. RV dysfunction. RV dysfunction as well as  pulmonary hypertension likely secondary to noncompliance with CPAP therapy at home.  Patient mentions that his CPAP mask as well as tubing worn out and he threw it away but he has a functioning CPAP.  Home health consulted for providing supply. Continue as needed oxygen for now.  5.Type 2 diabetes mellitus. 10 units of Lantus at home.  6.Essential hypertension. Relative hypotension. Patient has history of uncontrolled hypertension as an outpatient. Regimen include 10 mg amlodipine, 50 mg Coreg, 80 mg Lasix, 50 mg hydralazine 3 times daily, 20 mg Isordil 3 times daily, 25 mg Aldactone. In the hospital patient is on 50 mg Coreg, Lasix 40 mg IV twice daily and 20 mg Isordil only and despite holding all other blood pressure medication patient is hypotensive. Thissuggest medical noncompliance as well as dietary indiscretion at home on patient's part. At present we will reduce the dose of Coreg from 50 mg twice daily to 25 mg twice daily and monitor.  7.  Fever. Patient had an elevated temperature during the morning.  At present denies any other acute complaint. No active infection   All other chronic medical condition were stable during the hospitalization.  Patient was ambulatory without any assistance. On the day of the discharge the patient's vitals were stable, and no other acute medical condition were reported by patient. the patient was felt safe to be discharge at home with home health.  Procedures and Results:  Echocardiogram  Consultations:  Cardiology  DISCHARGE MEDICATION: Allergies as of 07/31/2017   No Known Allergies     Medication List    STOP taking these medications   amLODipine 10 MG tablet Commonly known as:  NORVASC   hydrALAZINE 50 MG tablet Commonly known as:  APRESOLINE   isosorbide dinitrate 20 MG tablet Commonly known as:  ISORDIL   multivitamin capsule   spironolactone 25 MG tablet Commonly known as:  ALDACTONE     TAKE these  medications   ACCU-CHEK SOFTCLIX LANCETS lancets Use to help check blood sugar once a day Dx e11.9   acetaminophen 500 MG tablet Commonly known as:  TYLENOL Take 500-1,000 mg by mouth every 6 (six) hours as needed (for bilateral knee pain).   allopurinol 300 MG tablet Commonly known as:  ZYLOPRIM TAKE 1 TABLET BY MOUTH  DAILY What changed:    how much to take  how to take this  when to take this   amiodarone 200 MG tablet Commonly known as:  PACERONE Take 1 tablet (200 mg total) by mouth daily. What changed:  when to take this   aspirin 81 MG EC tablet Take 1 tablet (81 mg total) by mouth daily.   atorvastatin 80 MG tablet Commonly known as:  LIPITOR TAKE 1 TABLET BY MOUTH AT  BEDTIME What changed:    how much to take  how to take this  when to take this   carvedilol 25 MG tablet Commonly known as:  COREG Take 1 tablet (25 mg total) by mouth 2 (two) times daily with  a meal. What changed:  See the new instructions.   furosemide 80 MG tablet Commonly known as:  LASIX Take 1 tablet (80 mg total) by mouth daily. What changed:    how much to take  how to take this  when to take this   glucose blood test strip Commonly known as:  ACCU-CHEK SMARTVIEW 1 each by Other route daily. Use to check blood sugar once a day Dx e11.9   Insulin Glargine 100 UNIT/ML Solostar Pen Commonly known as:  LANTUS SOLOSTAR INJECT 10 UNITS INTO THE SKIN AT BEDTIME.   Insulin Pen Needle 31G X 8 MM Misc Commonly known as:  B-D ULTRAFINE III SHORT PEN USE ONCE DAILY WITH INSULIN   nitroGLYCERIN 0.4 MG SL tablet Commonly known as:  NITROSTAT PLACE 1 TABLET UNDER TONGUE EVERY 5 MINS X3 DOSES AS NEEDED FOR CHEST PAIN   potassium chloride SA 20 MEQ tablet Commonly known as:  K-DUR,KLOR-CON Take 1 tablet (20 mEq total) by mouth daily. What changed:  how much to take   traMADol 50 MG tablet Commonly known as:  ULTRAM Take 1 tablet (50 mg total) by mouth every 8 (eight) hours  as needed. What changed:  reasons to take this   Turmeric 500 MG Caps Take 1,000 mg by mouth daily.   VITAMIN D-3 PO Take 1 capsule by mouth daily.   warfarin 5 MG tablet Commonly known as:  COUMADIN Take as directed. If you are unsure how to take this medication, talk to your nurse or doctor. Original instructions:  TAKE 2 TABLETS SUN, TUES,  THURS, SAT AND TAKE 1  TABLET MON, WED, FRI OR AS  DIRECTED BY ANTICOAGULATION CLINIC What changed:    how much to take  how to take this  when to take this  additional instructions      No Known Allergies Discharge Instructions    (HEART FAILURE PATIENTS) Call MD:  Anytime you have any of the following symptoms: 1) 3 pound weight gain in 24 hours or 5 pounds in 1 week 2) shortness of breath, with or without a dry hacking cough 3) swelling in the hands, feet or stomach 4) if you have to sleep on extra pillows at night in order to breathe.   Complete by:  As directed    Avoid straining   Complete by:  As directed    Diet - low sodium heart healthy   Complete by:  As directed    Heart Failure patients record your daily weight using the same scale at the same time of day   Complete by:  As directed    Increase activity slowly   Complete by:  As directed    STOP any activity that causes chest pain, shortness of breath, dizziness, sweating, or exessive weakness   Complete by:  As directed      Discharge Exam: Filed Weights   07/29/17 0525 07/30/17 0530 07/31/17 0340  Weight: 129.1 kg (284 lb 9.6 oz) 129.3 kg (285 lb) 125.5 kg (276 lb 9.6 oz)   Vitals:   07/31/17 0606 07/31/17 0858  BP:  (!) 98/55  Pulse:  68  Resp:    Temp:    SpO2: 96%    General: Appear in no distress, no Rash; Oral Mucosa moist. Cardiovascular: S1 and S2 Present, no Murmur, no JVD Respiratory: Bilateral Air entry present and Clear to Auscultation, no Crackles, no wheezes Abdomen: Bowel Sound present, Soft and no tenderness Extremities: no Pedal edema, no  calf  tenderness Neurology: Grossly no focal neuro deficit.  The results of significant diagnostics from this hospitalization (including imaging, microbiology, ancillary and laboratory) are listed below for reference.    Significant Diagnostic Studies: Dg Chest 2 View  Result Date: 07/27/2017 CLINICAL DATA:  Increased shortness of breath with minimal exertion. EXAM: CHEST - 2 VIEW COMPARISON:  09/22/2016. FINDINGS: Cardiomegaly. Single lead pacer unchanged. Mild vascular congestion without consolidation or frank edema. No effusion or pneumothorax. Similar appearance to priors. IMPRESSION: Mild vascular congestion without consolidation or frank edema. Cardiomegaly. Electronically Signed   By: Staci Righter M.D.   On: 07/27/2017 11:14    Microbiology: No results found for this or any previous visit (from the past 240 hour(s)).   Labs: CBC: Recent Labs  Lab 07/27/17 1114 07/28/17 0320 07/29/17 0517 07/30/17 0536 07/31/17 0152  WBC 5.5 5.3 6.6 6.1 5.2  HGB 13.5 12.9* 12.9* 12.7* 12.1*  HCT 44.0 42.9 42.6 42.5 40.2  MCV 100.9* 100.5* 101.2* 100.5* 100.2*  PLT 227 216 232 206 177   Basic Metabolic Panel: Recent Labs  Lab 07/27/17 1114 07/28/17 0320 07/29/17 0517 07/30/17 0536 07/31/17 0152  NA 141 142 141 138 140  K 4.3 5.1 4.7 4.4 4.2  CL 106 101 101 99* 99*  CO2 27 30 30 30  34*  GLUCOSE 142* 96 111* 88 92  BUN 36* 37* 35* 35* 34*  CREATININE 1.89* 1.93* 1.97* 1.89* 1.74*  CALCIUM 8.7* 8.5* 8.3* 8.5* 8.3*  MG  --   --  2.2  --   --    Cardiac Enzymes: Recent Labs  Lab 07/27/17 1609 07/27/17 2058 07/28/17 0320  TROPONINI 0.04* 0.04* 0.04*   BNP (last 3 results) Recent Labs    07/27/17 1114  BNP 787.1*   CBG: Recent Labs  Lab 07/30/17 1202 07/30/17 1732 07/30/17 2045 07/31/17 0733 07/31/17 1227  GLUCAP 110* 112* 158* 93 154*   Time spent: 35 minutes  Signed:  Berle Mull  [facility] 07/31/2017 , 5:56 PM

## 2017-08-03 ENCOUNTER — Encounter: Payer: Self-pay | Admitting: Cardiology

## 2017-08-03 ENCOUNTER — Ambulatory Visit (INDEPENDENT_AMBULATORY_CARE_PROVIDER_SITE_OTHER): Payer: Medicare Other | Admitting: Cardiology

## 2017-08-03 VITALS — BP 118/84 | HR 78 | Ht 72.0 in | Wt 287.4 lb

## 2017-08-03 DIAGNOSIS — N183 Chronic kidney disease, stage 3 unspecified: Secondary | ICD-10-CM | POA: Insufficient documentation

## 2017-08-03 DIAGNOSIS — E669 Obesity, unspecified: Secondary | ICD-10-CM

## 2017-08-03 DIAGNOSIS — I5043 Acute on chronic combined systolic (congestive) and diastolic (congestive) heart failure: Secondary | ICD-10-CM | POA: Diagnosis not present

## 2017-08-03 DIAGNOSIS — E785 Hyperlipidemia, unspecified: Secondary | ICD-10-CM

## 2017-08-03 DIAGNOSIS — I480 Paroxysmal atrial fibrillation: Secondary | ICD-10-CM | POA: Diagnosis not present

## 2017-08-03 NOTE — Patient Instructions (Signed)
Medication Instructions:  Continue current medications  If you need a refill on your cardiac medications before your next appointment, please call your pharmacy.  Labwork: None Ordered    Testing/Procedures: None ordered  Follow-Up: Your physician wants you to follow-up in: 2 Weeks with APP.     Thank you for choosing [facility] at [facility]!!

## 2017-08-04 ENCOUNTER — Encounter: Payer: Self-pay | Admitting: Internal Medicine

## 2017-08-04 ENCOUNTER — Ambulatory Visit (INDEPENDENT_AMBULATORY_CARE_PROVIDER_SITE_OTHER): Payer: Medicare Other | Admitting: Internal Medicine

## 2017-08-04 ENCOUNTER — Ambulatory Visit: Payer: Medicare Other | Admitting: Internal Medicine

## 2017-08-04 VITALS — BP 120/74 | HR 69 | Temp 97.9°F | Ht 72.0 in | Wt 285.0 lb

## 2017-08-04 DIAGNOSIS — E119 Type 2 diabetes mellitus without complications: Secondary | ICD-10-CM

## 2017-08-04 DIAGNOSIS — Z794 Long term (current) use of insulin: Secondary | ICD-10-CM | POA: Diagnosis not present

## 2017-08-04 DIAGNOSIS — Z0000 Encounter for general adult medical examination without abnormal findings: Secondary | ICD-10-CM

## 2017-08-04 DIAGNOSIS — E785 Hyperlipidemia, unspecified: Secondary | ICD-10-CM

## 2017-08-04 LAB — POCT GLYCOSYLATED HEMOGLOBIN (HGB A1C): Hemoglobin A1C: 5.6

## 2017-08-04 NOTE — Progress Notes (Signed)
Subjective:    Patient ID: [name], male    DOB: 1955-09-27, 61 y.o.   MRN: [id number]  HPI  Here for wellness and f/u after recent hospn; has seen cardiolgy yesterday,  Overall doing ok;  Pt denies Chest pain, worsening SOB, DOE, wheezing, orthopnea, PND, worsening LE edema, palpitations, dizziness or syncope.  Pt denies neurological change such as new headache, facial or extremity weakness.  Pt denies polydipsia, polyuria, or low sugar symptoms. Pt states overall good compliance with treatment and medications, good tolerability, and has been trying to follow appropriate diet.  Pt denies worsening depressive symptoms, suicidal ideation or panic. No fever, night sweats, wt loss, loss of appetite, or other constitutional symptoms.  Pt states good ability with ADL's, has low fall risk, home safety reviewed and adequate, no other significant changes in hearing or vision, and not active with exercise. On home o2 2L, has been told he shouldn't carry anything over the shoulder > 15 lbs per cardiology per pt.  CPAP a tnight helps well.  BP Readings from Last 3 Encounters:  08/04/17 120/74  08/03/17 118/84  07/31/17 (!) 98/55   Wt Readings from Last 3 Encounters:  08/04/17 285 lb (129.3 kg)  08/03/17 287 lb 6.4 oz (130.4 kg)  07/31/17 276 lb 9.6 oz (125.5 kg)   Past Medical History:  Diagnosis Date  . CAD (coronary artery disease)    a. Initial nonobst by cath 2009. b. Cath 01/2013 in setting of VT storm: obstructive distal Cx disease (small and terminates in the AV groove, unlikely to cause significant ischemia or electrical instability), nonobstructive RCA disease, EF 15-20%.   . Cerebrovascular accident [facility])    a. 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 Chronic systolic CHF (congestive heart failure) ([place])    a. Likely NICM (out of proportion to CAD). b. 2009 - EF 25-30% by echo, 01/2013: 15-20% by cath. c. 11/2014 Echo: EF 35-40%, Gr1 DD, mild MR, mod TR, PASP 55mmHg.  . CKD (chronic kidney  disease), stage II   . Dyslipidemia   . Gout   . HTN (hypertension)   . Hypokalemia   . ICD (implantable cardiac defibrillator) in place   . Insulin dependent diabetes mellitus ([place])   . Lipoma   . Nonischemic cardiomyopathy ([place])    a.  11/2014 Echo: EF 35-40%, Gr1 DD, mild MR, mod TR, PASP 64mmHg.  . OSA (obstructive sleep apnea)    does not wear cpap  . PAF (paroxysmal atrial fibrillation) ([place])    a. Noted 05/2008 by EKG;  b. CHA2DS2VASc = 5-6-->coumadin.  . Paroxysmal VT ([place])    a. s/p St. Jude ICD 2007. b. H/o paroxysmal VT/VF including VT storm 12/2012 admission prompting amio initiation;  c. 01/2013 ICD upgrade SJM [id number] [place] VR single lead ICD.  [name] Pulmonary HTN ([place])    a. Mild by cath 01/2013.   Past Surgical History:  Procedure Laterality Date  . CARDIAC CATHETERIZATION     Nonobstructive coronary disease 2009  . CARDIAC DEFIBRILLATOR PLACEMENT     ICD-St. Jude  . IMPLANTABLE CARDIOVERTER DEFIBRILLATOR (ICD) GENERATOR CHANGE N/A 01/15/2014   Procedure: ICD GENERATOR CHANGE;  Surgeon: Evans Lance, MD;  Location: [facility] CATH LAB;  Service: Cardiovascular;  Laterality: N/A;  . LEFT AND RIGHT HEART CATHETERIZATION WITH CORONARY ANGIOGRAM N/A 01/03/2013   Procedure: LEFT AND RIGHT HEART CATHETERIZATION WITH CORONARY ANGIOGRAM;  Surgeon: Peter M Martinique, MD;  Location: [facility] CATH LAB;  Service: Cardiovascular;  Laterality: N/A;  .  LIPOMA EXCISION      reports that he quit smoking about 31 years ago. He has never used smokeless tobacco. He reports that he does not drink alcohol or use drugs. family history includes Coronary artery disease in his mother; Heart attack in his maternal grandmother, maternal uncle, and mother. No Known Allergies Current Outpatient Medications on File Prior to Visit  Medication Sig Dispense Refill  . ACCU-CHEK SOFTCLIX LANCETS lancets Use to help check blood sugar once a day Dx e11.9 100 each 3  . acetaminophen (TYLENOL) 500 MG tablet Take 500-1,000 mg  by mouth every 6 (six) hours as needed (for bilateral knee pain).    [name] allopurinol (ZYLOPRIM) 300 MG tablet TAKE 1 TABLET BY MOUTH  DAILY (Patient taking differently: Take 300 mg by mouth once a day) 90 tablet 3  . amiodarone (PACERONE) 200 MG tablet Take 1 tablet (200 mg total) by mouth daily. (Patient taking differently: Take 200 mg by mouth at bedtime. ) 90 tablet 3  . aspirin EC 81 MG EC tablet Take 1 tablet (81 mg total) by mouth daily. 30 tablet 0  . atorvastatin (LIPITOR) 80 MG tablet TAKE 1 TABLET BY MOUTH AT  BEDTIME (Patient taking differently: Take 80 mg by mouth at bedtime) 90 tablet 3  . carvedilol (COREG) 25 MG tablet Take 1 tablet (25 mg total) by mouth 2 (two) times daily with a meal. 60 tablet 0  . Cholecalciferol (VITAMIN D-3 PO) Take 1 capsule by mouth daily.    . furosemide (LASIX) 80 MG tablet Take 1 tablet (80 mg total) by mouth daily. 90 tablet 0  . glucose blood (ACCU-CHEK SMARTVIEW) test strip 1 each by Other route daily. Use to check blood sugar once a day Dx e11.9 100 each 3  . Insulin Glargine (LANTUS SOLOSTAR) 100 UNIT/ML Solostar Pen INJECT 10 UNITS INTO THE SKIN AT BEDTIME. 15 pen 2  . Insulin Pen Needle (B-D ULTRAFINE III SHORT PEN) 31G X 8 MM MISC USE ONCE DAILY WITH INSULIN 30 each 2  . nitroGLYCERIN (NITROSTAT) 0.4 MG SL tablet PLACE 1 TABLET UNDER TONGUE EVERY 5 MINS X3 DOSES AS NEEDED FOR CHEST PAIN 25 tablet 11  . potassium chloride SA (K-DUR,KLOR-CON) 20 MEQ tablet Take 1 tablet (20 mEq total) by mouth daily. 30 tablet 0  . traMADol (ULTRAM) 50 MG tablet Take 1 tablet (50 mg total) by mouth every 8 (eight) hours as needed. (Patient taking differently: Take 50 mg by mouth every 8 (eight) hours as needed (for pain). ) 60 tablet 2  . Turmeric 500 MG CAPS Take 1,000 mg by mouth daily.    [name] warfarin (COUMADIN) 5 MG tablet TAKE 2 TABLETS SUN, TUES,  THURS, SAT AND TAKE 1  TABLET MON, WED, FRI OR AS  DIRECTED BY ANTICOAGULATION CLINIC (Patient taking differently: Take  5-10 mg by mouth See admin instructions. Take 10 mg by mouth at bedtime on Sun/Tues/Thurs and 5 mg on Mon/Tues/Wed/Sat) 180 tablet 1   No current facility-administered medications on file prior to visit.    Review of Systems Constitutional: Negative for other unusual diaphoresis, sweats, appetite or weight changes HENT: Negative for other worsening hearing loss, ear pain, facial swelling, mouth sores or neck stiffness.   Eyes: Negative for other worsening pain, redness or other visual disturbance.  Respiratory: Negative for other stridor or swelling Cardiovascular: Negative for other palpitations or other chest pain  Gastrointestinal: Negative for worsening diarrhea or loose stools, blood in stool, distention or other pain Genitourinary:  Negative for hematuria, flank pain or other change in urine volume.  Musculoskeletal: Negative for myalgias or other joint swelling.  Skin: Negative for other color change, or other wound or worsening drainage.  Neurological: Negative for other syncope or numbness. Hematological: Negative for other adenopathy or swelling Psychiatric/Behavioral: Negative for hallucinations, other worsening agitation, SI, self-injury, or new decreased concentration All other system neg per pt    Objective:   Physical Exam BP 120/74   Pulse 69   Temp 97.9 F (36.6 C) (Oral)   Ht 6' (1.829 m)   Wt 285 lb (129.3 kg)   SpO2 94%   BMI 38.65 kg/m  - on 2L continuous VS noted,  Constitutional: Pt is oriented to person, place, and time. Appears well-developed and well-nourished, in no significant distress and comfortable Head: Normocephalic and atraumatic  Eyes: Conjunctivae and EOM are normal. Pupils are equal, round, and reactive to light Right Ear: External ear normal without discharge Left Ear: External ear normal without discharge Nose: Nose without discharge or deformity Mouth/Throat: Oropharynx is without other ulcerations and moist  Neck: Normal range of motion.  Neck supple. No JVD present. No tracheal deviation present or significant neck LA or mass Cardiovascular: Normal rate, regular rhythm, normal heart sounds and intact distal pulses.   Pulmonary/Chest: WOB normal and breath sounds decreased without rales or wheezing  Abdominal: Soft. Bowel sounds are normal. NT. No HSM  Musculoskeletal: Normal range of motion. Exhibits no edema Lymphadenopathy: Has no other cervical adenopathy.  Neurological: Pt is alert and oriented to person, place, and time. Pt has normal reflexes. No cranial nerve deficit. Motor grossly intact, Gait intact Skin: Skin is warm and dry. No rash noted or new ulcerations Psychiatric:  Has normal mood and affect. Behavior is normal without agitation No other exam findings  Lab Results  Component Value Date   WBC 5.2 07/31/2017   HGB 12.1 (L) 07/31/2017   HCT 40.2 07/31/2017   PLT 192 07/31/2017   GLUCOSE 92 07/31/2017   CHOL 141 01/19/2017   TRIG 69.0 01/19/2017   HDL 41.10 01/19/2017   LDLDIRECT 194.6 05/09/2007   LDLCALC 87 01/19/2017   ALT 24 06/28/2017   AST 19 06/28/2017   NA 140 07/31/2017   K 4.2 07/31/2017   CL 99 (L) 07/31/2017   CREATININE 1.74 (H) 07/31/2017   BUN 34 (H) 07/31/2017   CO2 34 (H) 07/31/2017   TSH 1.540 06/28/2017   PSA 0.84 07/15/2016   INR 2.05 07/31/2017   HGBA1C 5.7 01/19/2017   MICROALBUR 4.6 (H) 07/15/2016   Declines further labs today  POCT A1c  - POCT glycosylated hemoglobin (Hb A1C)   Component 14:44  Hemoglobin A1C 5.6            Assessment & Plan:

## 2017-08-04 NOTE — Patient Instructions (Addendum)
An option for possible portable oxygen might be [facility], which you can call to check into if you like  Your A1c was OK today  Please continue all other medications as before, and refills have been done if requested.  Please have the pharmacy call with any other refills you may need.  Please continue your efforts at being more active, low cholesterol diet, and weight control.  You are otherwise up to date with prevention measures today.  Please keep your appointments with your specialists as you may have planned  Please return in 6 months, or sooner if needed, with Lab testing done 3-5 days before

## 2017-08-05 ENCOUNTER — Ambulatory Visit: Payer: Medicare Other | Admitting: Cardiology

## 2017-08-05 ENCOUNTER — Telehealth: Payer: Self-pay | Admitting: Internal Medicine

## 2017-08-05 NOTE — Telephone Encounter (Signed)
Copied from [place] [id number]. Topic: Quick Communication - See Telephone Encounter >> Aug 05, 2017 11:27 AM Vernona Rieger wrote: CRM for notification. See Telephone encounter for: 08/05/17.  Patient said that Dr Jenny Reichmann needs to write him a script for the oxygen tank that he has at home. Needs to [facility] to [facility], he does not have the fax number. They are coming to pick up the equipment tomorrow. He said that he was in the office yesterday and thought it would have been done then but was not sure. Call back if needed [phone]

## 2017-08-06 ENCOUNTER — Other Ambulatory Visit: Payer: Self-pay | Admitting: Internal Medicine

## 2017-08-06 DIAGNOSIS — J9611 Chronic respiratory failure with hypoxia: Secondary | ICD-10-CM

## 2017-08-06 NOTE — Telephone Encounter (Signed)
This should be addressed with an order for 2L [place] continuous with portable monitor later today

## 2017-08-06 NOTE — Telephone Encounter (Signed)
Pt checking on the script for his oxygen. They are coming today to pick up the equipment today.

## 2017-08-06 NOTE — Telephone Encounter (Signed)
Faxed to [facility]  

## 2017-08-07 DIAGNOSIS — Z7189 Other specified counseling: Secondary | ICD-10-CM | POA: Insufficient documentation

## 2017-08-07 DIAGNOSIS — Z0000 Encounter for general adult medical examination without abnormal findings: Principal | ICD-10-CM

## 2017-08-07 NOTE — Assessment & Plan Note (Signed)
Lab Results  Component Value Date   HGBA1C 5.6 08/04/2017  stable overall by history and exam, recent data reviewed with pt, and pt to continue medical treatment as before,  to f/u any worsening symptoms or concerns

## 2017-08-07 NOTE — Assessment & Plan Note (Signed)

## 2017-08-07 NOTE — Assessment & Plan Note (Signed)
Lab Results  Component Value Date   LDLCALC 87 01/19/2017  stable overall by history and exam, recent data reviewed with pt, and pt to continue medical treatment as before,  to f/u any worsening symptoms or concerns

## 2017-08-10 ENCOUNTER — Encounter: Payer: Self-pay | Admitting: Internal Medicine

## 2017-08-10 ENCOUNTER — Ambulatory Visit (INDEPENDENT_AMBULATORY_CARE_PROVIDER_SITE_OTHER): Payer: Medicare Other | Admitting: *Deleted

## 2017-08-10 ENCOUNTER — Telehealth: Payer: Self-pay | Admitting: Cardiology

## 2017-08-10 DIAGNOSIS — I472 Ventricular tachycardia, unspecified: Secondary | ICD-10-CM

## 2017-08-10 DIAGNOSIS — H2513 Age-related nuclear cataract, bilateral: Secondary | ICD-10-CM | POA: Diagnosis not present

## 2017-08-10 DIAGNOSIS — H35042 Retinal micro-aneurysms, unspecified, left eye: Secondary | ICD-10-CM | POA: Diagnosis not present

## 2017-08-10 DIAGNOSIS — H3562 Retinal hemorrhage, left eye: Secondary | ICD-10-CM | POA: Diagnosis not present

## 2017-08-10 DIAGNOSIS — E119 Type 2 diabetes mellitus without complications: Secondary | ICD-10-CM | POA: Diagnosis not present

## 2017-08-10 LAB — HM DIABETES EYE EXAM

## 2017-08-10 NOTE — Telephone Encounter (Signed)
Spoke with pt and reminded pt of remote transmission that is due today. Pt verbalized understanding.   

## 2017-08-11 NOTE — Progress Notes (Signed)
Remote ICD transmission.   

## 2017-08-12 ENCOUNTER — Encounter: Payer: Self-pay | Admitting: Cardiology

## 2017-08-13 ENCOUNTER — Telehealth: Payer: Self-pay | Admitting: Internal Medicine

## 2017-08-13 MED ORDER — GLUCOSE BLOOD VI STRP: 1.0000 | ORAL_STRIP | Freq: Every day | 3 refills | Status: DC

## 2017-08-13 NOTE — Telephone Encounter (Signed)
Reviewed chart pt is up-to-date sent refills to [facility]..refaxed through [facility].Marland KitchenJohny [name]

## 2017-08-13 NOTE — Telephone Encounter (Signed)
Copied from [place] [id number]. Topic: Quick Communication - Rx Refill/Question >> Aug 13, 2017  8:47 AM [name], Darius Emperor wrote: Medication: glucose blood (ACCU-CHEK SMARTVIEW) test strip [[id number]]  Has the patient contacted their pharmacy? Yes.   (Agent: If no, request that the patient contact the pharmacy for the refill.) Preferred Pharmacy (with phone number or street name): [facility], [place] [phone] (Phone) [phone] (Fax)  Agent: Please be advised that RX refills may take up to 3 business days. We ask that you follow-up with your pharmacy.

## 2017-08-13 NOTE — Telephone Encounter (Signed)
Request for accu chek smart view test strips. This prescription has expired as of 11/18/16. New pharmacy:  [facility] mail [facility]  [phone]  Provider  Dr. Cathlean Cower  LOV  08/04/17  Please review.

## 2017-08-20 ENCOUNTER — Other Ambulatory Visit: Payer: Self-pay | Admitting: Internal Medicine

## 2017-08-23 NOTE — Progress Notes (Signed)
Cardiology Office Note   Date:  08/23/2017   ID:  Darius, [name] 04-May-1956, MRN [id number]  PCP:  Biagio Borg, MD  Cardiologist: Percival Spanish   No chief complaint on file.    History of Present Illness: [name] is a 62 y.o. male who presents for ongoing assessment and management of systolic congestive heart failure with most recent EF of 20% to 25%, ICD in situ, .  Patient has a history of cardiomyopathy but has not been seen with any frequency was last seen by Dr. Percival Spanish on 08/03/2017.  The patient also has a history of difficult to control hypertension, CAD, but it has been felt that his cardiomyopathy was out of proportion to his coronary artery disease.    The patient has not been compliant with medications by history.  It was noted that during recent hospitalization on discharge hydralazine, imdoor, spironolactone, and his beta-blocker were discontinued in the setting of hypotension.  His baseline weight has been documented at 276 pounds.  On last office visit, the patient was considered for low-dose ACE inhibitor with close follow-up on renal function.  A BMET was ordered first before starting new therapy.  He was also to be considered for PYP scanning to screen for amyloidosis.  Close follow-up for blood pressure control is also to be screened on this office visit.  He is here today oxygen dependent without any cardiac complaints.  The patient has been compliant with his medications has been watching his weight and has a scale at home which reports his weight to his nurse remotely and is been given instructions to take extra doses of Lasix when needed.  He states that he is feeling better and watching salt intake.  He denies any chest discomfort dizziness or significant fatigue.  Past Medical History:  Diagnosis Date  . CAD (coronary artery disease)    a. Initial nonobst by cath 2009. b. Cath 01/2013 in setting of VT storm: obstructive distal Cx disease (small and terminates  in the AV groove, unlikely to cause significant ischemia or electrical instability), nonobstructive RCA disease, EF 15-20%.   . Cerebrovascular accident [facility])    a. 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 Chronic systolic CHF (congestive heart failure) ([place])    a. Likely NICM (out of proportion to CAD). b. 2009 - EF 25-30% by echo, 01/2013: 15-20% by cath. c. 11/2014 Echo: EF 35-40%, Gr1 DD, mild MR, mod TR, PASP 12mmHg.  . CKD (chronic kidney disease), stage II   . Dyslipidemia   . Gout   . HTN (hypertension)   . Hypokalemia   . ICD (implantable cardiac defibrillator) in place   . Insulin dependent diabetes mellitus ([name])   . Lipoma   . Nonischemic cardiomyopathy ([name])    a.  11/2014 Echo: EF 35-40%, Gr1 DD, mild MR, mod TR, PASP 60mmHg.  . OSA (obstructive sleep apnea)    does not wear cpap  . PAF (paroxysmal atrial fibrillation) ([name])    a. Noted 05/2008 by EKG;  b. CHA2DS2VASc = 5-6-->coumadin.  . Paroxysmal VT ([place])    a. s/p St. Jude ICD 2007. b. H/o paroxysmal VT/VF including VT storm 12/2012 admission prompting amio initiation;  c. 01/2013 ICD upgrade SJM [id number] Ellipse VR single lead ICD.  [name] Pulmonary HTN ([place])    a. Mild by cath 01/2013.    Past Surgical History:  Procedure Laterality Date  . CARDIAC CATHETERIZATION     Nonobstructive coronary disease 2009  . CARDIAC DEFIBRILLATOR  PLACEMENT     ICD-St. Jude  . IMPLANTABLE CARDIOVERTER DEFIBRILLATOR (ICD) GENERATOR CHANGE N/A 01/15/2014   Procedure: ICD GENERATOR CHANGE;  Surgeon: Evans Lance, MD;  Location: [facility] CATH LAB;  Service: Cardiovascular;  Laterality: N/A;  . LEFT AND RIGHT HEART CATHETERIZATION WITH CORONARY ANGIOGRAM N/A 01/03/2013   Procedure: LEFT AND RIGHT HEART CATHETERIZATION WITH CORONARY ANGIOGRAM;  Surgeon: Peter M Martinique, MD;  Location: [facility] CATH LAB;  Service: Cardiovascular;  Laterality: N/A;  . LIPOMA EXCISION       Current Outpatient Medications  Medication Sig Dispense Refill  . ACCU-CHEK SOFTCLIX  LANCETS lancets Use to help check blood sugar once a day Dx e11.9 100 each 3  . acetaminophen (TYLENOL) 500 MG tablet Take 500-1,000 mg by mouth every 6 (six) hours as needed (for bilateral knee pain).    [name] allopurinol (ZYLOPRIM) 300 MG tablet TAKE 1 TABLET BY MOUTH  DAILY 90 tablet 1  . amiodarone (PACERONE) 200 MG tablet TAKE 1 TABLET BY MOUTH  DAILY 90 tablet 1  . amLODipine (NORVASC) 10 MG tablet TAKE 1 TABLET BY MOUTH  DAILY 90 tablet 1  . aspirin EC 81 MG EC tablet Take 1 tablet (81 mg total) by mouth daily. 30 tablet 0  . atorvastatin (LIPITOR) 80 MG tablet TAKE 1 TABLET BY MOUTH AT  BEDTIME 90 tablet 1  . carvedilol (COREG) 25 MG tablet Take 1 tablet (25 mg total) by mouth 2 (two) times daily with a meal. 60 tablet 0  . Cholecalciferol (VITAMIN D-3 PO) Take 1 capsule by mouth daily.    . furosemide (LASIX) 80 MG tablet Take 1 tablet (80 mg total) by mouth daily. 90 tablet 0  . glucose blood (ACCU-CHEK SMARTVIEW) test strip 1 each by Other route daily. Use to check blood sugar once a day Dx e11.9 100 each 3  . hydrALAZINE (APRESOLINE) 50 MG tablet TAKE 1 TABLET BY MOUTH  EVERY 8 HOURS 270 tablet 1  . Insulin Glargine (LANTUS SOLOSTAR) 100 UNIT/ML Solostar Pen INJECT 10 UNITS INTO THE SKIN AT BEDTIME. 15 pen 2  . Insulin Pen Needle (B-D ULTRAFINE III SHORT PEN) 31G X 8 MM MISC USE ONCE DAILY WITH INSULIN 30 each 2  . nitroGLYCERIN (NITROSTAT) 0.4 MG SL tablet PLACE 1 TABLET UNDER TONGUE EVERY 5 MINS X3 DOSES AS NEEDED FOR CHEST PAIN 25 tablet 11  . potassium chloride SA (K-DUR,KLOR-CON) 20 MEQ tablet Take 1 tablet (20 mEq total) by mouth daily. 30 tablet 0  . spironolactone (ALDACTONE) 25 MG tablet TAKE 1 TABLET BY MOUTH  DAILY 90 tablet 1  . traMADol (ULTRAM) 50 MG tablet Take 1 tablet (50 mg total) by mouth every 8 (eight) hours as needed. (Patient taking differently: Take 50 mg by mouth every 8 (eight) hours as needed (for pain). ) 60 tablet 2  . Turmeric 500 MG CAPS Take 1,000 mg by  mouth daily.    [name] warfarin (COUMADIN) 5 MG tablet TAKE 2 TABLETS SUN, TUES,  THURS, SAT AND TAKE 1  TABLET MON, WED, FRI OR AS  DIRECTED BY ANTICOAGULATION CLINIC (Patient taking differently: Take 5-10 mg by mouth See admin instructions. Take 10 mg by mouth at bedtime on Sun/Tues/Thurs and 5 mg on Mon/Tues/Wed/Sat) 180 tablet 1   No current facility-administered medications for this visit.     Allergies:   Patient has no known allergies.    Social History:  The patient  reports that he quit smoking about 31 years ago. He has never  used smokeless tobacco. He reports that he does not drink alcohol or use drugs.   Family History:  The patient's family history includes Coronary artery disease in his mother; Heart attack in his maternal grandmother, maternal uncle, and mother.    ROS: All other systems are reviewed and negative. Unless otherwise mentioned in H&P    PHYSICAL EXAM: VS:  There were no vitals taken for this visit. , BMI There is no height or weight on file to calculate BMI. GEN: Well nourished, well developed, in no acute distress, morbidly obese. HEENT: normal  Neck: no JVD, carotid bruits, or masses Cardiac: RRR; distant heart sounds no murmurs, rubs, or gallops,no edema  Respiratory:  Clear to auscultation bilaterally, normal work of breathing, wearing oxygen via nasal cannula GI: soft, nontender, nondistended, + BS MS: no deformity or atrophy ICD can is noted in the left upper chest Skin: warm and dry, no rash Neuro:  Strength and sensation are intact Psych: euthymic mood, full affect   EKG: Normal sinus rhythm with first-degree AV block and occasional PVCs, left anterior fascicular block heart rate of 79 bpm  Recent Labs: 06/28/2017: ALT 24; TSH 1.540 07/27/2017: B Natriuretic Peptide 787.1 07/29/2017: Magnesium 2.2 07/31/2017: BUN 34; Creatinine, Ser 1.74; Hemoglobin 12.1; Platelets 192; Potassium 4.2; Sodium 140    Lipid Panel    Component Value Date/Time   CHOL  141 01/19/2017 1147   TRIG 69.0 01/19/2017 1147   HDL 41.10 01/19/2017 1147   CHOLHDL 3 01/19/2017 1147   VLDL 13.8 01/19/2017 1147   LDLCALC 87 01/19/2017 1147   LDLDIRECT 194.6 05/09/2007 1006      Wt Readings from Last 3 Encounters:  08/04/17 285 lb (129.3 kg)  08/03/17 287 lb 6.4 oz (130.4 kg)  07/31/17 276 lb 9.6 oz (125.5 kg)      Other studies Reviewed: Echocardiogram 2017-07-31 Left ventricle: The cavity size was mildly dilated. Wall   thickness was increased in a pattern of moderate LVH. Systolic   function was severely reduced. The estimated ejection fraction   was in the range of 20% to 25%. Diffuse hypokinesis. Doppler   parameters are consistent with abnormal left ventricular   relaxation (grade 1 diastolic dysfunction). - Aortic valve: There was no stenosis. - Aorta: Mildly dilated aortic root and ascending aorta. Aortic   root dimension: 40 mm (ED). Ascending aortic diameter: 43 mm (S). - Mitral valve: Mildly calcified annulus. There was no significant   regurgitation. - Left atrium: The atrium was mildly dilated. - Right ventricle: The cavity size was mildly dilated. Pacer wire   or catheter noted in right ventricle. Systolic function was   moderately reduced. - Right atrium: The atrium was mildly dilated. - Tricuspid valve: Peak RV-RA gradient (S): 32 mm Hg. - Pulmonary arteries: PA peak pressure: 40 mm Hg (S). - Systemic veins: IVC measured 2.6 cm with > 50% respirophasic   variation, suggesting RA pressure 8 mmHg.  ASSESSMENT AND PLAN:  1. HFrEF : Currently euvolemic.  The patient cardiogram was completed on Jul 31, 2017.  The echo report did not comment on any speckling or patient's for amyloidosis.  The patient LV systolic function remains reduced at 20% to 25% with grade 1 diastolic dysfunction.    The patient will continue current medication regimen which includes carvedilol 25 mg twice daily, furosemide 80 mg daily with extra dosing as needed based upon  weight.  He is following a low-sodium diet.  I will check a BMET today  2.  ICD  in situ: (St. Jude) the patient has remote checks per protocol has not yet had a in person follow-up scheduled due to have this completed yet.  The patient has not had any discharges over the last several months.  3.  Paroxysmal atrial fibrillation: Heart rate is currently controlled on amiodarone and carvedilol.  He continues on Coumadin therapy and is being followed by primary care at the [facility] office.   4.  Diabetes: Followed by primary care.  Current medicines are reviewed at length with the patient today.    Labs/ tests ordered today include: BMET  Phill Myron. West Pugh, ANP, AACC   08/23/2017 1:32 PM    [facility] [address], [place], [place] [address] Phone: [phone]; Fax: [phone]

## 2017-08-24 ENCOUNTER — Encounter: Payer: Self-pay | Admitting: Adult Health

## 2017-08-24 ENCOUNTER — Ambulatory Visit: Payer: Medicare Other | Admitting: Adult Health

## 2017-08-24 VITALS — BP 124/88 | HR 79 | Ht 72.0 in | Wt 284.0 lb

## 2017-08-24 DIAGNOSIS — I43 Cardiomyopathy in diseases classified elsewhere: Secondary | ICD-10-CM | POA: Diagnosis not present

## 2017-08-24 DIAGNOSIS — Z79899 Other long term (current) drug therapy: Secondary | ICD-10-CM | POA: Diagnosis not present

## 2017-08-24 DIAGNOSIS — I10 Essential (primary) hypertension: Secondary | ICD-10-CM

## 2017-08-24 DIAGNOSIS — I480 Paroxysmal atrial fibrillation: Secondary | ICD-10-CM

## 2017-08-24 DIAGNOSIS — I5042 Chronic combined systolic (congestive) and diastolic (congestive) heart failure: Secondary | ICD-10-CM

## 2017-08-24 NOTE — Patient Instructions (Signed)
Labwork: BMET TODAY HERE IN OUR OFFICE AT [facility]  Take the provided lab slips with you to the lab for your blood draw.   Special Instructions: MAKE SURE TO KEEP YOUR PACER DOWNLOAD  Follow-Up: Your physician wants you to follow-up in: [place] should receive a reminder letter in the mail two months in advance. If you do not receive a letter, please call our office 01-2018 to schedule the 03-2018 follow-up appointment.

## 2017-08-25 LAB — BASIC METABOLIC PANEL
BUN / CREAT RATIO: 10 (ref 10–24)
BUN: 15 mg/dL (ref 8–27)
CALCIUM: 9 mg/dL (ref 8.6–10.2)
CHLORIDE: 107 mmol/L — AB (ref 96–106)
CO2: 29 mmol/L (ref 20–29)
Creatinine, Ser: 1.46 mg/dL — ABNORMAL HIGH (ref 0.76–1.27)
GFR, EST AFRICAN AMERICAN: 59 mL/min/{1.73_m2} — AB (ref >59)
GFR, EST NON AFRICAN AMERICAN: 51 mL/min/{1.73_m2} — AB (ref >59)
Glucose: 68 mg/dL (ref 65–99)
Potassium: 4.4 mmol/L (ref 3.5–5.2)
SODIUM: 150 mmol/L — AB (ref 134–144)

## 2017-08-31 ENCOUNTER — Ambulatory Visit (INDEPENDENT_AMBULATORY_CARE_PROVIDER_SITE_OTHER): Payer: Medicare Other | Admitting: General Practice

## 2017-08-31 DIAGNOSIS — Z8679 Personal history of other diseases of the circulatory system: Secondary | ICD-10-CM

## 2017-08-31 DIAGNOSIS — Z7901 Long term (current) use of anticoagulants: Secondary | ICD-10-CM | POA: Diagnosis not present

## 2017-08-31 DIAGNOSIS — I4891 Unspecified atrial fibrillation: Secondary | ICD-10-CM | POA: Diagnosis not present

## 2017-08-31 DIAGNOSIS — I5043 Acute on chronic combined systolic (congestive) and diastolic (congestive) heart failure: Secondary | ICD-10-CM | POA: Diagnosis not present

## 2017-08-31 LAB — POCT INR: INR: 3.5

## 2017-08-31 NOTE — Patient Instructions (Addendum)
Pre visit review using our clinic review tool, if applicable. No additional management support is needed unless otherwise documented below in the visit note.  Hold coumadin today and then continue to 1 tablet daily except 2 tablets on Sunday/Tuesday/Thursday.  Re-check in 3 to 4 weeks.

## 2017-09-08 LAB — CUP PACEART REMOTE DEVICE CHECK
Battery Voltage: 2.98 V
Brady Statistic RV Percent Paced: 1 %
HIGH POWER IMPEDANCE MEASURED VALUE: 51 Ohm
HighPow Impedance: 50 Ohm
Implantable Lead Implant Date: 20070817
Implantable Lead Location: 753860
Implantable Lead Model: 7001
Lead Channel Pacing Threshold Amplitude: 1 V
Lead Channel Pacing Threshold Pulse Width: 0.7 ms
Lead Channel Setting Pacing Amplitude: 2.5 V
Lead Channel Setting Pacing Pulse Width: 0.7 ms
Lead Channel Setting Sensing Sensitivity: 0.5 mV
MDC IDC MSMT BATTERY REMAINING LONGEVITY: 71 mo
MDC IDC MSMT BATTERY REMAINING PERCENTAGE: 70 %
MDC IDC MSMT LEADCHNL RV IMPEDANCE VALUE: 430 Ohm
MDC IDC MSMT LEADCHNL RV SENSING INTR AMPL: 12 mV
MDC IDC PG IMPLANT DT: 20150914
MDC IDC SESS DTM: 20190409185349
Pulse Gen Serial Number: 7214340

## 2017-09-20 ENCOUNTER — Ambulatory Visit: Payer: Self-pay | Admitting: *Deleted

## 2017-09-20 ENCOUNTER — Telehealth: Payer: Self-pay | Admitting: Internal Medicine

## 2017-09-20 MED ORDER — ACCU-CHEK FASTCLIX LANCETS MISC: 2 refills | Status: DC

## 2017-09-20 NOTE — Telephone Encounter (Signed)
[place] for one time extra dosing, but should f/u with cardiology if this is not effective

## 2017-09-20 NOTE — Telephone Encounter (Signed)
Dr. Jenny Reichmann Prescription for lancets expired 08/19/16. NOV 09/21/17 LOV 08/04/17

## 2017-09-20 NOTE — Telephone Encounter (Signed)
Called pt, LVM.   

## 2017-09-20 NOTE — Telephone Encounter (Signed)
Pt wanted to know if he can take an extra half tab of his 80 mg of lasix. He has had ankle swelling for the last 2 weeks and had increased his lasix to 120 mg, 3 days last week and now finds that this is not helping. He denies any other swelling or shortness of breath. Per protocol, appointment made. Pt voiced understanding and home care advice given to him also. Will route this to flow at [facility] at [facility].  Reason for Disposition . [1] MILD swelling of both ankles (i.e., pedal edema) AND [2] new onset or worsening  Answer Assessment - Initial Assessment Questions 1. LOCATION: "Which joint is swollen?"     Both ankles 2. ONSET: "When did the swelling start?"     Started on the week before, for about 2 weeks 3. SIZE: "How large is the swelling?"     Can not visualize his bones in the ankle 4. PAIN: "Is there any pain?" If so, ask: "How bad is it?" (Scale 1-10; or mild, moderate, severe)     No pain 5. CAUSE: "What do you think caused the swollen joint?"     Retaining fluid 6. OTHER SYMPTOMS: "Do you have any other symptoms?" (e.g., fever, chest pain, difficulty breathing, calf pain)     no 7. PREGNANCY: "Is there any chance you are pregnant?" "When was your last menstrual period?"     no  Protocols used: LEG SWELLING AND EDEMA-A-AH, ANKLE SWELLING-A-AH

## 2017-09-20 NOTE — Telephone Encounter (Signed)
Reviewed chart pt is up-to-date sent refills to requested pharmacy.../lmb  

## 2017-09-20 NOTE — Telephone Encounter (Incomplete)
Copied from [id number]. Topic: Quick Communication - Rx Refill/Question >> Sep 20, 2017  8:30 AM Aurelio Brash B wrote: Medication:  ACCU-CHEK fastCLIX LANCETS lancets Has the patient contacted their pharmacy? no (Agent: If no, request that the patient contact the pharmacy for the refill.) (Agent: If yes, when and what did the pharmacy advise?)  Preferred Pharmacy (with phone number or street name): [place] [address] [employer] [phone] (Phone) [phone] (Fax)    ***  Agent: Please be advised that RX refills may take up to 3 business days. We ask that you follow-up with your pharmacy.

## 2017-09-20 NOTE — Telephone Encounter (Signed)
Pt called back and was informed of the instructions below per PCP. He stated the he would follow up with cardiology if it has not improved with the extra dose of Lasix.

## 2017-09-21 ENCOUNTER — Ambulatory Visit: Payer: Medicare Other | Admitting: Internal Medicine

## 2017-09-21 ENCOUNTER — Ambulatory Visit (INDEPENDENT_AMBULATORY_CARE_PROVIDER_SITE_OTHER): Payer: Medicare Other | Admitting: General Practice

## 2017-09-21 DIAGNOSIS — I4891 Unspecified atrial fibrillation: Secondary | ICD-10-CM

## 2017-09-21 DIAGNOSIS — Z7901 Long term (current) use of anticoagulants: Secondary | ICD-10-CM | POA: Diagnosis not present

## 2017-09-21 DIAGNOSIS — Z8679 Personal history of other diseases of the circulatory system: Secondary | ICD-10-CM

## 2017-09-21 LAB — POCT INR: INR: 3.4 — AB (ref 2.0–3.0)

## 2017-09-21 NOTE — Patient Instructions (Addendum)
Pre visit review using our clinic review tool, if applicable. No additional management support is needed unless otherwise documented below in the visit note.  Hold coumadin today (5/21) and change dosage and take 1 tablet daily except 2 tablets on Monday and Fridays.   Re-check in 4 weeks.

## 2017-09-22 ENCOUNTER — Ambulatory Visit: Payer: Medicare Other | Admitting: Internal Medicine

## 2017-09-30 DIAGNOSIS — I5043 Acute on chronic combined systolic (congestive) and diastolic (congestive) heart failure: Secondary | ICD-10-CM | POA: Diagnosis not present

## 2017-09-30 NOTE — Progress Notes (Signed)
HPI The patient presents for follow up of CHF.  In March he was in the hospital for volume overload thought to be in part related to medical non adherence.  Echo demonstrated the EF to be 20 - 25% and lower than previous.   Of note he was noted to have a non functioning CPAP at that appt.   The patient has a long history of a myopathy.  However, I have not seen him in the clinic since 2017. He has had difficult to control HTN.  He has had CAD.  However, it was felt that his cardiomyopathy was out of proportion to his coronary disease.  It does not sound like he is always been compliant with his medications.  He has been back for a few appts since that discharge.   He presents for routine follow-up.  Unfortunately he is feeling worse.  By our scales he is up 11 pounds since his last visit in about 20 pounds since his admission.  By his scales he is not a much but after questioning I think these are probably inaccurate.  He has increased lower extremity swelling and abdominal distention.  He is wearing his oxygen which he usually uses as needed now he is using it all the time.  He is short of breath walking short distance on level ground. (Class he says he is been good with his salt in his diet.  He is been compliant with his medications.III).  He has discomfort taking a deep breath and because he feels like his abdominal girth is getting away.  He has not had any cough fevers or chills.  There are no new palpitations, presyncope or syncope.   No Known Allergies  Current Outpatient Medications  Medication Sig Dispense Refill  . ACCU-CHEK FASTCLIX LANCETS MISC Use to help check blood sugars daily 102 each 2  . acetaminophen (TYLENOL) 500 MG tablet Take 500-1,000 mg by mouth every 6 (six) hours as needed (for bilateral knee pain).    [name] allopurinol (ZYLOPRIM) 300 MG tablet TAKE 1 TABLET BY MOUTH  DAILY 90 tablet 1  . amiodarone (PACERONE) 200 MG tablet TAKE 1 TABLET BY MOUTH  DAILY 90 tablet 1  .  aspirin EC 81 MG EC tablet Take 1 tablet (81 mg total) by mouth daily. 30 tablet 0  . atorvastatin (LIPITOR) 80 MG tablet TAKE 1 TABLET BY MOUTH AT  BEDTIME 90 tablet 1  . carvedilol (COREG) 25 MG tablet Take 1 tablet (25 mg total) by mouth 2 (two) times daily with a meal. 60 tablet 0  . Cholecalciferol (VITAMIN D-3 PO) Take 1 capsule by mouth daily.    . furosemide (LASIX) 80 MG tablet Take 1 tablet (80 mg total) by mouth daily. 90 tablet 0  . glucose blood (ACCU-CHEK SMARTVIEW) test strip 1 each by Other route daily. Use to check blood sugar once a day Dx e11.9 100 each 3  . Insulin Glargine (LANTUS SOLOSTAR) 100 UNIT/ML Solostar Pen INJECT 10 UNITS INTO THE SKIN AT BEDTIME. 15 pen 2  . Insulin Pen Needle (B-D ULTRAFINE III SHORT PEN) 31G X 8 MM MISC USE ONCE DAILY WITH INSULIN 30 each 2  . nitroGLYCERIN (NITROSTAT) 0.4 MG SL tablet PLACE 1 TABLET UNDER TONGUE EVERY 5 MINS X3 DOSES AS NEEDED FOR CHEST PAIN 25 tablet 11  . potassium chloride SA (K-DUR,KLOR-CON) 20 MEQ tablet Take 1 tablet (20 mEq total) by mouth daily. 30 tablet 0  . traMADol (ULTRAM) 50  MG tablet Take 1 tablet (50 mg total) by mouth every 8 (eight) hours as needed. (Patient taking differently: Take 50 mg by mouth every 8 (eight) hours as needed (for pain). ) 60 tablet 2  . Turmeric 500 MG CAPS Take 1,000 mg by mouth daily.    [name] warfarin (COUMADIN) 5 MG tablet Take 5 mg by mouth as directed. Take two pills on Monday and Friday and take one pill on the other days     No current facility-administered medications for this visit.     Past Medical History:  Diagnosis Date  . CAD (coronary artery disease)    a. Initial nonobst by cath 2009. b. Cath 01/2013 in setting of VT storm: obstructive distal Cx disease (small and terminates in the AV groove, unlikely to cause significant ischemia or electrical instability), nonobstructive RCA disease, EF 15-20%.   . Cerebrovascular accident [facility])    a. 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  Chronic systolic CHF (congestive heart failure) ([place])    a. Likely NICM (out of proportion to CAD). b. 2009 - EF 25-30% by echo, 01/2013: 15-20% by cath. c. 11/2014 Echo: EF 35-40%, Gr1 DD, mild MR, mod TR, PASP 57mmHg.  . CKD (chronic kidney disease), stage II   . Dyslipidemia   . Gout   . HTN (hypertension)   . Hypokalemia   . ICD (implantable cardiac defibrillator) in place   . Insulin dependent diabetes mellitus ([place])   . Lipoma   . Nonischemic cardiomyopathy ([place])    a.  11/2014 Echo: EF 35-40%, Gr1 DD, mild MR, mod TR, PASP 8mmHg.  . OSA (obstructive sleep apnea)    does not wear cpap  . PAF (paroxysmal atrial fibrillation) ([place])    a. Noted 05/2008 by EKG;  b. CHA2DS2VASc = 5-6-->coumadin.  . Paroxysmal VT ([place])    a. s/p St. Jude ICD 2007. b. H/o paroxysmal VT/VF including VT storm 12/2012 admission prompting amio initiation;  c. 01/2013 ICD upgrade SJM 1411-36Q Ellipse VR single lead ICD.  [name] Pulmonary HTN ([place])    a. Mild by cath 01/2013.    Past Surgical History:  Procedure Laterality Date  . CARDIAC CATHETERIZATION     Nonobstructive coronary disease 2009  . CARDIAC DEFIBRILLATOR PLACEMENT     ICD-St. Jude  . IMPLANTABLE CARDIOVERTER DEFIBRILLATOR (ICD) GENERATOR CHANGE N/A 01/15/2014   Procedure: ICD GENERATOR CHANGE;  Surgeon: Evans Lance, MD;  Location: [facility] CATH LAB;  Service: Cardiovascular;  Laterality: N/A;  . LEFT AND RIGHT HEART CATHETERIZATION WITH CORONARY ANGIOGRAM N/A 01/03/2013   Procedure: LEFT AND RIGHT HEART CATHETERIZATION WITH CORONARY ANGIOGRAM;  Surgeon: Peter M Martinique, MD;  Location: [facility] CATH LAB;  Service: Cardiovascular;  Laterality: N/A;  . LIPOMA EXCISION      Social History   Socioeconomic History  . Marital status: Married    Spouse name: Not on file  . Number of children: 2  . Years of education: Not on file  . Highest education level: Not on file  Occupational History  . Occupation: medically retired due to heart failure  Social Needs  .  Financial resource strain: Not on file  . Food insecurity:    Worry: Not on file    Inability: Not on file  . Transportation needs:    Medical: Not on file    Non-medical: Not on file  Tobacco Use  . Smoking status: Former Smoker    Last attempt to quit: 05/04/1986    Years since quitting: 31.4  .  Smokeless tobacco: Never Used  . Tobacco comment: Quit smoking 30 yrs ago. Smoked as teenager less than 1/2 ppd. Smoked x 4 years.  Substance and Sexual Activity  . Alcohol use: No    Comment: occasionally  . Drug use: No  . Sexual activity: Never  Lifestyle  . Physical activity:    Days per week: Not on file    Minutes per session: Not on file  . Stress: Not on file  Relationships  . Social connections:    Talks on phone: Not on file    Gets together: Not on file    Attends religious service: Not on file    Active member of club or organization: Not on file    Attends meetings of clubs or organizations: Not on file    Relationship status: Not on file  Other Topics Concern  . Not on file  Social History Narrative  . Not on file   Family History  Problem Relation Age of Onset  . Coronary artery disease Mother   . Heart attack Mother   . Heart attack Maternal Uncle   . Heart attack Maternal Grandmother     ROS:   Otherwise as stated in the HPI and negative for all other systems./  PHYSICAL EXAM BP 125/79   Pulse 75   Ht 6' (1.829 m)   Wt 295 lb 9.6 oz (134.1 kg)   SpO2 (!) 78%   BMI 40.09 kg/m   GENERAL:  Well appearing HEENT:  Pupils equal round and reactive, fundi not visualized, oral mucosa unremarkable NECK:  Positive jugular venous distention 10 cm at 45 degrees, waveform within normal limits, carotid upstroke brisk and symmetric, no bruits, no thyromegaly LYMPHATICS:  No cervical, inguinal adenopathy LUNGS:  Clear to auscultation bilaterally BACK:  No CVA tenderness CHEST:  Unremarkable HEART:  PMI not displaced or sustained,S1 and S2 within normal limits, no  S3, no S4, no clicks, no rubs, NO murmurs ABD:  Flat, positive bowel sounds normal in frequency in pitch, no bruits, no rebound, no guarding, no midline pulsatile mass, no hepatomegaly, no splenomegaly EXT:  2 plus pulses throughout, severe edema, no cyanosis no clubbing SKIN:  No rashes no nodules NEURO:  Cranial nerves II through XII grossly intact, motor grossly intact throughout PSYCH:  Cognitively intact, oriented to person place and time   EKG:   Sinus rhythm, rate 75, interventricular conduction delay, premature ectopic complexes.  10/01/2017   Lab Results  Component Value Date   TSH 1.540 06/28/2017   ALT 24 06/28/2017   AST 19 06/28/2017   ALKPHOS 80 06/28/2017   BILITOT 1.3 (H) 06/28/2017   PROT 7.2 06/28/2017   ALBUMIN 4.2 06/28/2017   Lab Results  Component Value Date   CREATININE 1.46 (H) 08/24/2017   Lab Results  Component Value Date   CHOL 141 01/19/2017   TRIG 69.0 01/19/2017   HDL 41.10 01/19/2017   LDLCALC 87 01/19/2017   LDLDIRECT 194.6 05/09/2007   Lab Results  Component Value Date   HGBA1C 5.6 08/04/2017    ASSESSMENT AND PLAN  Congestive heart failure, systolic-  Patient has a decompensated congestive heart failure.  Needs admission for IV diuresis and consultation with advanced heart failure team.  Is been difficult to titrate his medications because of renal insufficiency and hypotension and medical nonadherence.  However, we can initiate afterload reduction in the hospital and consider advanced therapies.  We should consider PYP scanning to screen for amyloid  HYPERTENSION -  His blood pressures is to be treated on the context of treating his HF.   Atrial fibrillation-  He is had no symptomatic paroxysms.  Continue current therapy.    CKD III - Creat was as above.     HYPERLIPIDEMIA - His LDL was recently 92.  He needs follow up lipids when admitted.   ICD - He is up to date with follow up remotely.     OBESITY - This needs to  continue to be addressed.   DM - A1c was 5.6.

## 2017-10-01 ENCOUNTER — Inpatient Hospital Stay (HOSPITAL_COMMUNITY)
Admission: AD | Admit: 2017-10-01 | Discharge: 2017-10-07 | DRG: 286 | Disposition: A | Discharge status: Home / Self Care | Payer: Medicare Other | Source: Ambulatory Visit | Attending: Cardiology | Admitting: Cardiology

## 2017-10-01 ENCOUNTER — Ambulatory Visit (INDEPENDENT_AMBULATORY_CARE_PROVIDER_SITE_OTHER): Payer: Medicare Other | Admitting: Cardiology

## 2017-10-01 ENCOUNTER — Encounter: Payer: Self-pay | Admitting: Cardiology

## 2017-10-01 VITALS — BP 125/79 | HR 75 | Ht 72.0 in | Wt 295.6 lb

## 2017-10-01 DIAGNOSIS — Z95810 Presence of automatic (implantable) cardiac defibrillator: Secondary | ICD-10-CM

## 2017-10-01 DIAGNOSIS — I428 Other cardiomyopathies: Secondary | ICD-10-CM | POA: Diagnosis present

## 2017-10-01 DIAGNOSIS — Z79899 Other long term (current) drug therapy: Secondary | ICD-10-CM | POA: Diagnosis not present

## 2017-10-01 DIAGNOSIS — N183 Chronic kidney disease, stage 3 unspecified: Secondary | ICD-10-CM

## 2017-10-01 DIAGNOSIS — E785 Hyperlipidemia, unspecified: Secondary | ICD-10-CM | POA: Diagnosis present

## 2017-10-01 DIAGNOSIS — Z8673 Personal history of transient ischemic attack (TIA), and cerebral infarction without residual deficits: Secondary | ICD-10-CM | POA: Diagnosis not present

## 2017-10-01 DIAGNOSIS — Z7982 Long term (current) use of aspirin: Secondary | ICD-10-CM | POA: Diagnosis not present

## 2017-10-01 DIAGNOSIS — Z6841 Body Mass Index (BMI) 40.0 and over, adult: Secondary | ICD-10-CM

## 2017-10-01 DIAGNOSIS — I509 Heart failure, unspecified: Secondary | ICD-10-CM

## 2017-10-01 DIAGNOSIS — K649 Unspecified hemorrhoids: Secondary | ICD-10-CM | POA: Diagnosis not present

## 2017-10-01 DIAGNOSIS — Z9119 Patient's noncompliance with other medical treatment and regimen: Secondary | ICD-10-CM

## 2017-10-01 DIAGNOSIS — K625 Hemorrhage of anus and rectum: Secondary | ICD-10-CM | POA: Diagnosis not present

## 2017-10-01 DIAGNOSIS — I2721 Secondary pulmonary arterial hypertension: Secondary | ICD-10-CM | POA: Diagnosis present

## 2017-10-01 DIAGNOSIS — I5023 Acute on chronic systolic (congestive) heart failure: Secondary | ICD-10-CM | POA: Diagnosis not present

## 2017-10-01 DIAGNOSIS — M109 Gout, unspecified: Secondary | ICD-10-CM | POA: Diagnosis not present

## 2017-10-01 DIAGNOSIS — Z87891 Personal history of nicotine dependence: Secondary | ICD-10-CM

## 2017-10-01 DIAGNOSIS — I5043 Acute on chronic combined systolic (congestive) and diastolic (congestive) heart failure: Secondary | ICD-10-CM

## 2017-10-01 DIAGNOSIS — I2510 Atherosclerotic heart disease of native coronary artery without angina pectoris: Secondary | ICD-10-CM | POA: Diagnosis present

## 2017-10-01 DIAGNOSIS — E662 Morbid (severe) obesity with alveolar hypoventilation: Secondary | ICD-10-CM | POA: Diagnosis present

## 2017-10-01 DIAGNOSIS — I130 Hypertensive heart and chronic kidney disease with heart failure and stage 1 through stage 4 chronic kidney disease, or unspecified chronic kidney disease: Secondary | ICD-10-CM | POA: Diagnosis not present

## 2017-10-01 DIAGNOSIS — Z9289 Personal history of other medical treatment: Secondary | ICD-10-CM

## 2017-10-01 DIAGNOSIS — I480 Paroxysmal atrial fibrillation: Secondary | ICD-10-CM | POA: Diagnosis present

## 2017-10-01 DIAGNOSIS — R791 Abnormal coagulation profile: Secondary | ICD-10-CM | POA: Diagnosis present

## 2017-10-01 DIAGNOSIS — Z7901 Long term (current) use of anticoagulants: Secondary | ICD-10-CM | POA: Diagnosis not present

## 2017-10-01 DIAGNOSIS — Z9981 Dependence on supplemental oxygen: Secondary | ICD-10-CM | POA: Diagnosis not present

## 2017-10-01 DIAGNOSIS — I10 Essential (primary) hypertension: Secondary | ICD-10-CM | POA: Diagnosis not present

## 2017-10-01 DIAGNOSIS — E1122 Type 2 diabetes mellitus with diabetic chronic kidney disease: Secondary | ICD-10-CM | POA: Diagnosis present

## 2017-10-01 DIAGNOSIS — Z794 Long term (current) use of insulin: Secondary | ICD-10-CM | POA: Diagnosis not present

## 2017-10-01 DIAGNOSIS — R0902 Hypoxemia: Secondary | ICD-10-CM

## 2017-10-01 DIAGNOSIS — K922 Gastrointestinal hemorrhage, unspecified: Secondary | ICD-10-CM | POA: Diagnosis not present

## 2017-10-01 DIAGNOSIS — R0602 Shortness of breath: Secondary | ICD-10-CM

## 2017-10-01 DIAGNOSIS — I272 Pulmonary hypertension, unspecified: Secondary | ICD-10-CM | POA: Diagnosis not present

## 2017-10-01 LAB — CBC WITH DIFFERENTIAL/PLATELET
ABS IMMATURE GRANULOCYTES: 0 10*3/uL (ref 0.0–0.1)
BASOS ABS: 0 10*3/uL (ref 0.0–0.1)
BASOS PCT: 0 %
EOS ABS: 0.1 10*3/uL (ref 0.0–0.7)
Eosinophils Relative: 1 %
HCT: 42.4 % (ref 39.0–52.0)
Hemoglobin: 12.5 g/dL — ABNORMAL LOW (ref 13.0–17.0)
IMMATURE GRANULOCYTES: 0 %
Lymphocytes Relative: 14 %
Lymphs Abs: 0.7 10*3/uL (ref 0.7–4.0)
MCH: 28.2 pg (ref 26.0–34.0)
MCHC: 29.5 g/dL — ABNORMAL LOW (ref 30.0–36.0)
MCV: 95.5 fL (ref 78.0–100.0)
MONO ABS: 0.5 10*3/uL (ref 0.1–1.0)
MONOS PCT: 10 %
NEUTROS ABS: 3.9 10*3/uL (ref 1.7–7.7)
NEUTROS PCT: 75 %
PLATELETS: 212 10*3/uL (ref 150–400)
RBC: 4.44 MIL/uL (ref 4.22–5.81)
RDW: 16.5 % — AB (ref 11.5–15.5)
WBC: 5.2 10*3/uL (ref 4.0–10.5)

## 2017-10-01 LAB — TSH: TSH: 0.86 u[IU]/mL (ref 0.350–4.500)

## 2017-10-01 LAB — COMPREHENSIVE METABOLIC PANEL
ALK PHOS: 57 U/L (ref 38–126)
ALT: 60 U/L (ref 17–63)
ANION GAP: 6 (ref 5–15)
AST: 33 U/L (ref 15–41)
Albumin: 2.9 g/dL — ABNORMAL LOW (ref 3.5–5.0)
BILIRUBIN TOTAL: 2 mg/dL — AB (ref 0.3–1.2)
BUN: 19 mg/dL (ref 6–20)
CALCIUM: 8.4 mg/dL — AB (ref 8.9–10.3)
CO2: 34 mmol/L — AB (ref 22–32)
CREATININE: 1.64 mg/dL — AB (ref 0.61–1.24)
Chloride: 106 mmol/L (ref 101–111)
GFR calc non Af Amer: 44 mL/min — ABNORMAL LOW (ref >60)
GFR, EST AFRICAN AMERICAN: 51 mL/min — AB (ref >60)
Glucose, Bld: 105 mg/dL — ABNORMAL HIGH (ref 65–99)
Potassium: 3.7 mmol/L (ref 3.5–5.1)
Sodium: 146 mmol/L — ABNORMAL HIGH (ref 135–145)
TOTAL PROTEIN: 5.2 g/dL — AB (ref 6.5–8.1)

## 2017-10-01 LAB — GLUCOSE, CAPILLARY
GLUCOSE-CAPILLARY: 115 mg/dL — AB (ref 65–99)
Glucose-Capillary: 168 mg/dL — ABNORMAL HIGH (ref 65–99)
Glucose-Capillary: 67 mg/dL (ref 65–99)

## 2017-10-01 LAB — PROTIME-INR
INR: 2.16
PROTHROMBIN TIME: 23.9 s — AB (ref 11.4–15.2)

## 2017-10-01 LAB — BRAIN NATRIURETIC PEPTIDE: B Natriuretic Peptide: 1883.8 pg/mL — ABNORMAL HIGH (ref 0.0–100.0)

## 2017-10-01 MED ORDER — TRAMADOL HCL 50 MG PO TABS: 50.0000 mg | ORAL_TABLET | Freq: Three times a day (TID) | ORAL | Status: DC | PRN

## 2017-10-01 MED ORDER — SODIUM CHLORIDE 0.9% FLUSH: 3.0000 mL | INTRAVENOUS | Status: DC | PRN

## 2017-10-01 MED ORDER — ONDANSETRON HCL 4 MG/2ML IJ SOLN: 4.0000 mg | Freq: Four times a day (QID) | INTRAMUSCULAR | Status: DC | PRN

## 2017-10-01 MED ORDER — INSULIN GLARGINE 100 UNIT/ML ~~LOC~~ SOLN
10.0000 [IU] | Freq: Every day | SUBCUTANEOUS | Status: DC
  Administered 2017-10-01 – 2017-10-06 (×6): 10 [IU] via SUBCUTANEOUS
  Filled 2017-10-01 (×7): qty 0.1

## 2017-10-01 MED ORDER — ATORVASTATIN CALCIUM 80 MG PO TABS
80.0000 mg | ORAL_TABLET | Freq: Every day | ORAL | Status: DC
  Administered 2017-10-01 – 2017-10-06 (×6): 80 mg via ORAL
  Filled 2017-10-01 (×6): qty 1

## 2017-10-01 MED ORDER — SODIUM CHLORIDE 0.9 % IV SOLN: 250.0000 mL | INTRAVENOUS | Status: DC | PRN

## 2017-10-01 MED ORDER — SODIUM CHLORIDE 0.9% FLUSH
3.0000 mL | Freq: Two times a day (BID) | INTRAVENOUS | Status: DC
  Administered 2017-10-01 – 2017-10-03 (×5): 3 mL via INTRAVENOUS

## 2017-10-01 MED ORDER — VITAMIN D 1000 UNITS PO TABS
1000.0000 [IU] | ORAL_TABLET | Freq: Every day | ORAL | Status: DC
  Administered 2017-10-02 – 2017-10-07 (×6): 1000 [IU] via ORAL
  Filled 2017-10-01 (×6): qty 1

## 2017-10-01 MED ORDER — AMIODARONE HCL 200 MG PO TABS
200.0000 mg | ORAL_TABLET | Freq: Every day | ORAL | Status: DC
  Administered 2017-10-01 – 2017-10-07 (×7): 200 mg via ORAL
  Filled 2017-10-01 (×7): qty 1

## 2017-10-01 MED ORDER — ASPIRIN EC 81 MG PO TBEC
81.0000 mg | DELAYED_RELEASE_TABLET | Freq: Every day | ORAL | Status: DC
  Administered 2017-10-01 – 2017-10-03 (×3): 81 mg via ORAL
  Filled 2017-10-01 (×3): qty 1

## 2017-10-01 MED ORDER — POTASSIUM CHLORIDE CRYS ER 20 MEQ PO TBCR
20.0000 meq | EXTENDED_RELEASE_TABLET | Freq: Every day | ORAL | Status: DC
  Administered 2017-10-01 – 2017-10-07 (×7): 20 meq via ORAL
  Filled 2017-10-01 (×7): qty 1

## 2017-10-01 MED ORDER — ALLOPURINOL 300 MG PO TABS
300.0000 mg | ORAL_TABLET | Freq: Every day | ORAL | Status: DC
  Administered 2017-10-01 – 2017-10-07 (×7): 300 mg via ORAL
  Filled 2017-10-01 (×7): qty 1

## 2017-10-01 MED ORDER — CARVEDILOL 25 MG PO TABS
25.0000 mg | ORAL_TABLET | Freq: Two times a day (BID) | ORAL | Status: DC
  Administered 2017-10-01 – 2017-10-05 (×8): 25 mg via ORAL
  Filled 2017-10-01 (×6): qty 1
  Filled 2017-10-01: qty 2
  Filled 2017-10-01: qty 1

## 2017-10-01 MED ORDER — FUROSEMIDE 10 MG/ML IJ SOLN
80.0000 mg | Freq: Two times a day (BID) | INTRAMUSCULAR | Status: DC
  Administered 2017-10-01 – 2017-10-03 (×4): 80 mg via INTRAVENOUS
  Filled 2017-10-01 (×4): qty 8

## 2017-10-01 MED ORDER — POTASSIUM CHLORIDE CRYS ER 20 MEQ PO TBCR: 20.0000 meq | EXTENDED_RELEASE_TABLET | Freq: Every day | ORAL | Status: DC

## 2017-10-01 MED ORDER — ACETAMINOPHEN 500 MG PO TABS
500.0000 mg | ORAL_TABLET | Freq: Four times a day (QID) | ORAL | Status: DC | PRN
  Administered 2017-10-02: 1000 mg via ORAL
  Filled 2017-10-01: qty 2

## 2017-10-01 MED ORDER — INSULIN ASPART 100 UNIT/ML ~~LOC~~ SOLN
0.0000 [IU] | Freq: Three times a day (TID) | SUBCUTANEOUS | Status: DC
  Administered 2017-10-01: 2 [IU] via SUBCUTANEOUS
  Administered 2017-10-02 – 2017-10-04 (×4): 1 [IU] via SUBCUTANEOUS

## 2017-10-01 NOTE — Progress Notes (Signed)
ANTICOAGULATION CONSULT NOTE - Initial Consult  Pharmacy Consult for Heparin Indication: atrial fibrillation  No Known Allergies  Patient Measurements: Height: 6' (182.9 cm) Weight: 290 lb 8 oz (131.8 kg)(scale B) IBW/kg (Calculated) : 77.6 Heparin Dosing Weight: 107.4 kg  Vital Signs: Temp: 98 F (36.7 C) (05/31 1020) Temp Source: Oral (05/31 1020) BP: 136/98 (05/31 1020) Pulse Rate: 74 (05/31 1020)  Labs: Recent Labs    10/01/17 1346  HGB 12.5*  HCT 42.4  PLT 212  LABPROT 23.9*  INR 2.16    CrCl cannot be calculated (Patient's most recent lab result is older than the maximum 21 days allowed.).   Medical History: Past Medical History:  Diagnosis Date  . CAD (coronary artery disease)    a. Initial nonobst by cath 2009. b. Cath 01/2013 in setting of VT storm: obstructive distal Cx disease (small and terminates in the AV groove, unlikely to cause significant ischemia or electrical instability), nonobstructive RCA disease, EF 15-20%.   . Cerebrovascular accident [facility])    a. 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 Chronic systolic CHF (congestive heart failure) ([employer])    a. Likely NICM (out of proportion to CAD). b. 2009 - EF 25-30% by echo, 01/2013: 15-20% by cath. c. 11/2014 Echo: EF 35-40%, Gr1 DD, mild MR, mod TR, PASP 61mmHg.  . CKD (chronic kidney disease), stage II   . Dyslipidemia   . Gout   . HTN (hypertension)   . Hypokalemia   . ICD (implantable cardiac defibrillator) in place   . Insulin dependent diabetes mellitus ([place])   . Lipoma   . Nonischemic cardiomyopathy ([place])    a.  11/2014 Echo: EF 35-40%, Gr1 DD, mild MR, mod TR, PASP 62mmHg.  . OSA (obstructive sleep apnea)    does not wear cpap  . PAF (paroxysmal atrial fibrillation) ([place])    a. Noted 05/2008 by EKG;  b. CHA2DS2VASc = 5-6-->coumadin.  . Paroxysmal VT ([place])    a. s/p St. Jude ICD 2007. b. H/o paroxysmal VT/VF including VT storm 12/2012 admission prompting amio initiation;  c. 01/2013 ICD upgrade SJM  1411-36Q Ellipse VR single lead ICD.  [name] Pulmonary HTN ([place])    a. Mild by cath 01/2013.    Medications:  Medications Prior to Admission  Medication Sig Dispense Refill Last Dose  . ACCU-CHEK FASTCLIX LANCETS MISC Use to help check blood sugars daily 102 each 2 Taking  . acetaminophen (TYLENOL) 500 MG tablet Take 500-1,000 mg by mouth every 6 (six) hours as needed (for bilateral knee pain).   Taking  . allopurinol (ZYLOPRIM) 300 MG tablet TAKE 1 TABLET BY MOUTH  DAILY 90 tablet 1 Taking  . amiodarone (PACERONE) 200 MG tablet TAKE 1 TABLET BY MOUTH  DAILY 90 tablet 1 Taking  . aspirin EC 81 MG EC tablet Take 1 tablet (81 mg total) by mouth daily. 30 tablet 0 Taking  . atorvastatin (LIPITOR) 80 MG tablet TAKE 1 TABLET BY MOUTH AT  BEDTIME 90 tablet 1 Taking  . carvedilol (COREG) 25 MG tablet Take 1 tablet (25 mg total) by mouth 2 (two) times daily with a meal. 60 tablet 0 Taking  . Cholecalciferol (VITAMIN D-3 PO) Take 1 capsule by mouth daily.   Taking  . furosemide (LASIX) 80 MG tablet Take 1 tablet (80 mg total) by mouth daily. 90 tablet 0 Taking  . glucose blood (ACCU-CHEK SMARTVIEW) test strip 1 each by Other route daily. Use to check blood sugar once a day Dx e11.9 100  each 3 Taking  . Insulin Glargine (LANTUS SOLOSTAR) 100 UNIT/ML Solostar Pen INJECT 10 UNITS INTO THE SKIN AT BEDTIME. 15 pen 2 Taking  . Insulin Pen Needle (B-D ULTRAFINE III SHORT PEN) 31G X 8 MM MISC USE ONCE DAILY WITH INSULIN 30 each 2 Taking  . nitroGLYCERIN (NITROSTAT) 0.4 MG SL tablet PLACE 1 TABLET UNDER TONGUE EVERY 5 MINS X3 DOSES AS NEEDED FOR CHEST PAIN 25 tablet 11 Taking  . potassium chloride SA (K-DUR,KLOR-CON) 20 MEQ tablet Take 1 tablet (20 mEq total) by mouth daily. 30 tablet 0 Taking  . traMADol (ULTRAM) 50 MG tablet Take 1 tablet (50 mg total) by mouth every 8 (eight) hours as needed. (Patient taking differently: Take 50 mg by mouth every 8 (eight) hours as needed (for pain). ) 60 tablet 2 Taking  .  Turmeric 500 MG CAPS Take 1,000 mg by mouth daily.   Taking  . warfarin (COUMADIN) 5 MG tablet Take 5 mg by mouth as directed. Take two pills on Monday and Friday and take one pill on the other days   Taking    Assessment: 62 yo M on Coumadin PTA for PAF.  Admitted with HF exacerbation.  Currently holding Coumadin with orders to start heparin when INR < 2 in the event that cardiac cath is needed.   Goal of Therapy:  INR 2-3 Heparin level 0.3-0.7 units/ml Monitor platelets by anticoagulation protocol: Yes   Plan:  No heparin at this time.  INR >2. Follow-up with INR in AM.  Horton Chin, Pharm.D., BCPS Clinical Pharmacist Pager: [phone] Clinical phone for 10/01/2017 from 8:30-4:00 is [phone]. After 4pm, please call Main Rx [phone]) for assistance. 10/01/2017 2:50 PM

## 2017-10-01 NOTE — H&P (Signed)
[place] Note Please see note below from Dr. Percival Spanish which serves as this patient's History and Physical. I was asked to assist in placing admission orders - reviewed patient with Dr. Percival Spanish. Per our discussion, will admit and: - check admit labs - obtain CXR - supp O2 PRN - Lasix 80mg  IV BID - continue insulin and add SSI/CBG checks - continue current beta blocker dose (he does not feel it needs to be adjusted acutely) - hold coumadin and place order for heparin per pharmacy to begin when INR <2, in case heart failure team deems cath is necessary - heart failure consult - Trish will help coordinate and CHF team will pick up tomorrow - no need for repeat echo or troponins - offer CPAP qhs for OSA although he does not use at home per notes - patient confirms he has not taken any meds today Dayna Dunn PA-C   ____________________________________________________________        HPI The patient presents for follow up of CHF.  In March he was in the hospital for volume overload thought to be in part related to medical non adherence.  Echo demonstrated the EF to be 20 - 25% and lower than previous.   Of note he was noted to have a non functioning CPAP at that appt.   The patient has a long history of a myopathy.  However, I have not seen him in the clinic since 2017. He has had difficult to control HTN.  He has had CAD.  However, it was felt that his cardiomyopathy was out of proportion to his coronary disease.  It does not sound like he is always been compliant with his medications.  He has been back for a few appts since that discharge.   He presents for routine follow-up.  Unfortunately he is feeling worse.  By our scales he is up 11 pounds since his last visit in about 20 pounds since his admission.  By his scales he is not a much but after questioning I think these are probably inaccurate.  He has increased lower extremity swelling and abdominal distention.  He is wearing his oxygen  which he usually uses as needed now he is using it all the time.  He is short of breath walking short distance on level ground. (Class he says he is been good with his salt in his diet.  He is been compliant with his medications.III).  He has discomfort taking a deep breath and because he feels like his abdominal girth is getting away.  He has not had any cough fevers or chills.  There are no new palpitations, presyncope or syncope.   No Known Allergies        Current Outpatient Medications  Medication Sig Dispense Refill  . ACCU-CHEK FASTCLIX LANCETS MISC Use to help check blood sugars daily 102 each 2  . acetaminophen (TYLENOL) 500 MG tablet Take 500-1,000 mg by mouth every 6 (six) hours as needed (for bilateral knee pain).    [name] allopurinol (ZYLOPRIM) 300 MG tablet TAKE 1 TABLET BY MOUTH  DAILY 90 tablet 1  . amiodarone (PACERONE) 200 MG tablet TAKE 1 TABLET BY MOUTH  DAILY 90 tablet 1  . aspirin EC 81 MG EC tablet Take 1 tablet (81 mg total) by mouth daily. 30 tablet 0  . atorvastatin (LIPITOR) 80 MG tablet TAKE 1 TABLET BY MOUTH AT  BEDTIME 90 tablet 1  . carvedilol (COREG) 25 MG tablet Take 1 tablet (25 mg total) by mouth  2 (two) times daily with a meal. 60 tablet 0  . Cholecalciferol (VITAMIN D-3 PO) Take 1 capsule by mouth daily.    . furosemide (LASIX) 80 MG tablet Take 1 tablet (80 mg total) by mouth daily. 90 tablet 0  . glucose blood (ACCU-CHEK SMARTVIEW) test strip 1 each by Other route daily. Use to check blood sugar once a day Dx e11.9 100 each 3  . Insulin Glargine (LANTUS SOLOSTAR) 100 UNIT/ML Solostar Pen INJECT 10 UNITS INTO THE SKIN AT BEDTIME. 15 pen 2  . Insulin Pen Needle (B-D ULTRAFINE III SHORT PEN) 31G X 8 MM MISC USE ONCE DAILY WITH INSULIN 30 each 2  . nitroGLYCERIN (NITROSTAT) 0.4 MG SL tablet PLACE 1 TABLET UNDER TONGUE EVERY 5 MINS X3 DOSES AS NEEDED FOR CHEST PAIN 25 tablet 11  . potassium chloride SA (K-DUR,KLOR-CON) 20 MEQ tablet Take 1 tablet (20 mEq  total) by mouth daily. 30 tablet 0  . traMADol (ULTRAM) 50 MG tablet Take 1 tablet (50 mg total) by mouth every 8 (eight) hours as needed. (Patient taking differently: Take 50 mg by mouth every 8 (eight) hours as needed (for pain). ) 60 tablet 2  . Turmeric 500 MG CAPS Take 1,000 mg by mouth daily.    [name] warfarin (COUMADIN) 5 MG tablet Take 5 mg by mouth as directed. Take two pills on Monday and Friday and take one pill on the other days     No current facility-administered medications for this visit.         Past Medical History:  Diagnosis Date  . CAD (coronary artery disease)    a. Initial nonobst by cath 2009. b. Cath 01/2013 in setting of VT storm: obstructive distal Cx disease (small and terminates in the AV groove, unlikely to cause significant ischemia or electrical instability), nonobstructive RCA disease, EF 15-20%.   . Cerebrovascular accident [facility])    a. 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 Chronic systolic CHF (congestive heart failure) ([place])    a. Likely NICM (out of proportion to CAD). b. 2009 - EF 25-30% by echo, 01/2013: 15-20% by cath. c. 11/2014 Echo: EF 35-40%, Gr1 DD, mild MR, mod TR, PASP 89mmHg.  . CKD (chronic kidney disease), stage II   . Dyslipidemia   . Gout   . HTN (hypertension)   . Hypokalemia   . ICD (implantable cardiac defibrillator) in place   . Insulin dependent diabetes mellitus ([place])   . Lipoma   . Nonischemic cardiomyopathy ([place])    a.  11/2014 Echo: EF 35-40%, Gr1 DD, mild MR, mod TR, PASP 39mmHg.  . OSA (obstructive sleep apnea)    does not wear cpap  . PAF (paroxysmal atrial fibrillation) ([place])    a. Noted 05/2008 by EKG;  b. CHA2DS2VASc = 5-6-->coumadin.  . Paroxysmal VT ([place])    a. s/p St. Jude ICD 2007. b. H/o paroxysmal VT/VF including VT storm 12/2012 admission prompting amio initiation;  c. 01/2013 ICD upgrade SJM [id number] Ellipse VR single lead ICD.  [name] Pulmonary HTN ([place])    a. Mild by cath 01/2013.           Past Surgical History:  Procedure Laterality Date  . CARDIAC CATHETERIZATION     Nonobstructive coronary disease 2009  . CARDIAC DEFIBRILLATOR PLACEMENT     ICD-St. Jude  . IMPLANTABLE CARDIOVERTER DEFIBRILLATOR (ICD) GENERATOR CHANGE N/A 01/15/2014   Procedure: ICD GENERATOR CHANGE;  Surgeon: Evans Lance, MD;  Location: [facility] CATH LAB;  Service: Cardiovascular;  Laterality: N/A;  . LEFT AND RIGHT HEART CATHETERIZATION WITH CORONARY ANGIOGRAM N/A 01/03/2013   Procedure: LEFT AND RIGHT HEART CATHETERIZATION WITH CORONARY ANGIOGRAM;  Surgeon: Peter M Martinique, MD;  Location: [facility] CATH LAB;  Service: Cardiovascular;  Laterality: N/A;  . LIPOMA EXCISION      Social History        Socioeconomic History  . Marital status: Married    Spouse name: Not on file  . Number of children: 2  . Years of education: Not on file  . Highest education level: Not on file  Occupational History  . Occupation: medically retired due to heart failure  Social Needs  . Financial resource strain: Not on file  . Food insecurity:    Worry: Not on file    Inability: Not on file  . Transportation needs:    Medical: Not on file    Non-medical: Not on file  Tobacco Use  . Smoking status: Former Smoker    Last attempt to quit: 05/04/1986    Years since quitting: 31.4  . Smokeless tobacco: Never Used  . Tobacco comment: Quit smoking 30 yrs ago. Smoked as teenager less than 1/2 ppd. Smoked x 4 years.  Substance and Sexual Activity  . Alcohol use: No    Comment: occasionally  . Drug use: No  . Sexual activity: Never  Lifestyle  . Physical activity:    Days per week: Not on file    Minutes per session: Not on file  . Stress: Not on file  Relationships  . Social connections:    Talks on phone: Not on file    Gets together: Not on file    Attends religious service: Not on file    Active member of club or organization: Not on file    Attends meetings of clubs or  organizations: Not on file    Relationship status: Not on file  Other Topics Concern  . Not on file  Social History Narrative  . Not on file        Family History  Problem Relation Age of Onset  . Coronary artery disease Mother   . Heart attack Mother   . Heart attack Maternal Uncle   . Heart attack Maternal Grandmother     ROS:   Otherwise as stated in the HPI and negative for all other systems./  PHYSICAL EXAM BP 125/79   Pulse 75   Ht 6' (1.829 m)   Wt 295 lb 9.6 oz (134.1 kg)   SpO2 (!) 78%   BMI 40.09 kg/m   GENERAL:  Well appearing HEENT:  Pupils equal round and reactive, fundi not visualized, oral mucosa unremarkable NECK:  Positive jugular venous distention 10 cm at 45 degrees, waveform within normal limits, carotid upstroke brisk and symmetric, no bruits, no thyromegaly LYMPHATICS:  No cervical, inguinal adenopathy LUNGS:  Clear to auscultation bilaterally BACK:  No CVA tenderness CHEST:  Unremarkable HEART:  PMI not displaced or sustained,S1 and S2 within normal limits, no S3, no S4, no clicks, no rubs, NO murmurs ABD:  Flat, positive bowel sounds normal in frequency in pitch, no bruits, no rebound, no guarding, no midline pulsatile mass, no hepatomegaly, no splenomegaly EXT:  2 plus pulses throughout, severe edema, no cyanosis no clubbing SKIN:  No rashes no nodules NEURO:  Cranial nerves II through XII grossly intact, motor grossly intact throughout PSYCH:  Cognitively intact, oriented to person place and time   EKG:   Sinus rhythm, rate 75, interventricular conduction  delay, premature ectopic complexes.  10/01/2017   RecentLabs  Lab Results  Component Value Date   TSH 1.540 06/28/2017   ALT 24 06/28/2017   AST 19 06/28/2017   ALKPHOS 80 06/28/2017   BILITOT 1.3 (H) 06/28/2017   PROT 7.2 06/28/2017   ALBUMIN 4.2 06/28/2017     RecentLabs       Lab Results  Component Value Date   CREATININE 1.46 (H) 08/24/2017      RecentLabs       Lab Results  Component Value Date   CHOL 141 01/19/2017   TRIG 69.0 01/19/2017   HDL 41.10 01/19/2017   LDLCALC 87 01/19/2017   LDLDIRECT 194.6 05/09/2007     RecentLabs       Lab Results  Component Value Date   HGBA1C 5.6 08/04/2017      ASSESSMENT AND PLAN  Congestive heart failure, systolic-  Patient has a decompensated congestive heart failure.  Needs admission for IV diuresis and consultation with advanced heart failure team.  Is been difficult to titrate his medications because of renal insufficiency and hypotension and medical nonadherence.  However, we can initiate afterload reduction in the hospital and consider advanced therapies.  We should consider PYP scanning to screen for amyloid  HYPERTENSION -  His blood pressures is to be treated on the context of treating his HF.   Atrial fibrillation-  He is had no symptomatic paroxysms.  Continue current therapy.    CKD III - Creat was as above.     HYPERLIPIDEMIA - His LDL was recently 79.  He needs follow up lipids when admitted.   ICD - He is up to date with follow up remotely.     OBESITY - This needs to continue to be addressed.   DM - A1c was 5.6.    Note above by Minus Breeding, MD

## 2017-10-01 NOTE — Addendum Note (Signed)
Addended by: Zebedee Iba on: 10/01/2017 10:24 AM   Modules accepted: Orders

## 2017-10-02 ENCOUNTER — Observation Stay (HOSPITAL_COMMUNITY): Payer: Medicare Other

## 2017-10-02 ENCOUNTER — Encounter (HOSPITAL_COMMUNITY): Payer: Self-pay | Admitting: Nurse Practitioner

## 2017-10-02 ENCOUNTER — Other Ambulatory Visit: Payer: Self-pay

## 2017-10-02 DIAGNOSIS — E1122 Type 2 diabetes mellitus with diabetic chronic kidney disease: Secondary | ICD-10-CM | POA: Diagnosis present

## 2017-10-02 DIAGNOSIS — Z9119 Patient's noncompliance with other medical treatment and regimen: Secondary | ICD-10-CM | POA: Diagnosis not present

## 2017-10-02 DIAGNOSIS — N183 Chronic kidney disease, stage 3 (moderate): Secondary | ICD-10-CM | POA: Diagnosis present

## 2017-10-02 DIAGNOSIS — I428 Other cardiomyopathies: Secondary | ICD-10-CM | POA: Diagnosis present

## 2017-10-02 DIAGNOSIS — K922 Gastrointestinal hemorrhage, unspecified: Secondary | ICD-10-CM | POA: Diagnosis not present

## 2017-10-02 DIAGNOSIS — I480 Paroxysmal atrial fibrillation: Secondary | ICD-10-CM | POA: Diagnosis not present

## 2017-10-02 DIAGNOSIS — I2721 Secondary pulmonary arterial hypertension: Secondary | ICD-10-CM | POA: Diagnosis present

## 2017-10-02 DIAGNOSIS — Z87891 Personal history of nicotine dependence: Secondary | ICD-10-CM | POA: Diagnosis not present

## 2017-10-02 DIAGNOSIS — Z6841 Body Mass Index (BMI) 40.0 and over, adult: Secondary | ICD-10-CM | POA: Diagnosis not present

## 2017-10-02 DIAGNOSIS — R0602 Shortness of breath: Secondary | ICD-10-CM | POA: Diagnosis not present

## 2017-10-02 DIAGNOSIS — I5023 Acute on chronic systolic (congestive) heart failure: Secondary | ICD-10-CM | POA: Diagnosis not present

## 2017-10-02 DIAGNOSIS — Z8673 Personal history of transient ischemic attack (TIA), and cerebral infarction without residual deficits: Secondary | ICD-10-CM | POA: Diagnosis not present

## 2017-10-02 DIAGNOSIS — Z7901 Long term (current) use of anticoagulants: Secondary | ICD-10-CM | POA: Diagnosis not present

## 2017-10-02 DIAGNOSIS — M109 Gout, unspecified: Secondary | ICD-10-CM | POA: Diagnosis present

## 2017-10-02 DIAGNOSIS — I130 Hypertensive heart and chronic kidney disease with heart failure and stage 1 through stage 4 chronic kidney disease, or unspecified chronic kidney disease: Secondary | ICD-10-CM | POA: Diagnosis not present

## 2017-10-02 DIAGNOSIS — Z79899 Other long term (current) drug therapy: Secondary | ICD-10-CM | POA: Diagnosis not present

## 2017-10-02 DIAGNOSIS — E785 Hyperlipidemia, unspecified: Secondary | ICD-10-CM | POA: Diagnosis present

## 2017-10-02 DIAGNOSIS — Z95810 Presence of automatic (implantable) cardiac defibrillator: Secondary | ICD-10-CM | POA: Diagnosis not present

## 2017-10-02 DIAGNOSIS — R791 Abnormal coagulation profile: Secondary | ICD-10-CM | POA: Diagnosis present

## 2017-10-02 DIAGNOSIS — Z9981 Dependence on supplemental oxygen: Secondary | ICD-10-CM | POA: Diagnosis not present

## 2017-10-02 DIAGNOSIS — Z7982 Long term (current) use of aspirin: Secondary | ICD-10-CM | POA: Diagnosis not present

## 2017-10-02 DIAGNOSIS — Z794 Long term (current) use of insulin: Secondary | ICD-10-CM | POA: Diagnosis not present

## 2017-10-02 DIAGNOSIS — K625 Hemorrhage of anus and rectum: Secondary | ICD-10-CM | POA: Diagnosis not present

## 2017-10-02 DIAGNOSIS — K649 Unspecified hemorrhoids: Secondary | ICD-10-CM | POA: Diagnosis present

## 2017-10-02 DIAGNOSIS — E662 Morbid (severe) obesity with alveolar hypoventilation: Secondary | ICD-10-CM | POA: Diagnosis present

## 2017-10-02 DIAGNOSIS — I272 Pulmonary hypertension, unspecified: Secondary | ICD-10-CM | POA: Diagnosis not present

## 2017-10-02 DIAGNOSIS — I2510 Atherosclerotic heart disease of native coronary artery without angina pectoris: Secondary | ICD-10-CM | POA: Diagnosis not present

## 2017-10-02 DIAGNOSIS — I509 Heart failure, unspecified: Secondary | ICD-10-CM | POA: Diagnosis not present

## 2017-10-02 LAB — GLUCOSE, CAPILLARY
GLUCOSE-CAPILLARY: 127 mg/dL — AB (ref 65–99)
Glucose-Capillary: 92 mg/dL (ref 65–99)
Glucose-Capillary: 126 mg/dL — ABNORMAL HIGH (ref 65–99)
Glucose-Capillary: 124 mg/dL — ABNORMAL HIGH (ref 65–99)

## 2017-10-02 LAB — BASIC METABOLIC PANEL
ANION GAP: 9 (ref 5–15)
BUN: 20 mg/dL (ref 6–20)
CHLORIDE: 105 mmol/L (ref 101–111)
CO2: 31 mmol/L (ref 22–32)
Calcium: 8.4 mg/dL — ABNORMAL LOW (ref 8.9–10.3)
Creatinine, Ser: 1.75 mg/dL — ABNORMAL HIGH (ref 0.61–1.24)
GFR calc non Af Amer: 40 mL/min — ABNORMAL LOW (ref >60)
GFR, EST AFRICAN AMERICAN: 47 mL/min — AB (ref >60)
Glucose, Bld: 107 mg/dL — ABNORMAL HIGH (ref 65–99)
POTASSIUM: 3.5 mmol/L (ref 3.5–5.1)
Sodium: 145 mmol/L (ref 135–145)

## 2017-10-02 LAB — PROTIME-INR
INR: 1.82
PROTHROMBIN TIME: 20.9 s — AB (ref 11.4–15.2)

## 2017-10-02 LAB — CBC
HCT: 44 % (ref 39.0–52.0)
Hemoglobin: 13.2 g/dL (ref 13.0–17.0)
MCH: 28.6 pg (ref 26.0–34.0)
MCHC: 30 g/dL (ref 30.0–36.0)
MCV: 95.4 fL (ref 78.0–100.0)
PLATELETS: 223 10*3/uL (ref 150–400)
RBC: 4.61 MIL/uL (ref 4.22–5.81)
RDW: 16.7 % — ABNORMAL HIGH (ref 11.5–15.5)
WBC: 5.8 10*3/uL (ref 4.0–10.5)

## 2017-10-02 LAB — HEPARIN LEVEL (UNFRACTIONATED): HEPARIN UNFRACTIONATED: 0.31 [IU]/mL (ref 0.30–0.70)

## 2017-10-02 LAB — LIPID PANEL
CHOL/HDL RATIO: 4.8 ratio
Cholesterol: 114 mg/dL (ref 0–200)
HDL: 24 mg/dL — AB (ref >40)
LDL CALC: 79 mg/dL (ref 0–99)
TRIGLYCERIDES: 55 mg/dL (ref <150)
VLDL: 11 mg/dL (ref 0–40)

## 2017-10-02 MED ORDER — HEPARIN (PORCINE) IN NACL 100-0.45 UNIT/ML-% IJ SOLN
1500.0000 [IU]/h | INTRAMUSCULAR | Status: DC
  Administered 2017-10-02 – 2017-10-03 (×3): 1500 [IU]/h via INTRAVENOUS
  Filled 2017-10-02 (×2): qty 250

## 2017-10-02 MED ORDER — ISOSORB DINITRATE-HYDRALAZINE 20-37.5 MG PO TABS
0.5000 | ORAL_TABLET | Freq: Three times a day (TID) | ORAL | Status: DC
  Filled 2017-10-02 (×2): qty 1

## 2017-10-02 MED ORDER — POTASSIUM CHLORIDE CRYS ER 20 MEQ PO TBCR
20.0000 meq | EXTENDED_RELEASE_TABLET | Freq: Once | ORAL | Status: AC
  Administered 2017-10-02: 20 meq via ORAL
  Filled 2017-10-02: qty 1

## 2017-10-02 MED ORDER — HEPARIN BOLUS VIA INFUSION
4000.0000 [IU] | Freq: Once | INTRAVENOUS | Status: AC
  Administered 2017-10-02: 4000 [IU] via INTRAVENOUS
  Filled 2017-10-02: qty 4000

## 2017-10-02 MED ORDER — METOLAZONE 2.5 MG PO TABS
2.5000 mg | ORAL_TABLET | Freq: Once | ORAL | Status: AC
  Administered 2017-10-02: 2.5 mg via ORAL
  Filled 2017-10-02: qty 1

## 2017-10-02 NOTE — Progress Notes (Signed)
Patient stated he went to the bathroom and had a bowel movement and noticed bright red blood in the BM and on the toilet paper.  He states he has a history of hemorrhoids.  No dizziness, lightheaded.  Dr. Aundra Dubin made aware.  Received verbal order to hold heparin drip for 2 hours.  Pharmacy made aware as well to adjust labs as needed.  Will continue to monitor patient.

## 2017-10-02 NOTE — Progress Notes (Signed)
Pharmacist called and made aware patient's INR less than 2 and orders to start heparin drip.

## 2017-10-02 NOTE — Progress Notes (Addendum)
ANTICOAGULATION CONSULT NOTE - Initial Consult  Pharmacy Consult for Heparin Indication: atrial fibrillation  Patient Measurements: Height: 6' (182.9 cm) Weight: 289 lb 12.8 oz (131.5 kg)(scale a) IBW/kg (Calculated) : 77.6 Heparin Dosing Weight: 107.4 kg  Labs: Recent Labs    10/01/17 1346 10/02/17 0720  HGB 12.5*  --   HCT 42.4  --   PLT 212  --   LABPROT 23.9* 20.9*  INR 2.16 1.82  CREATININE 1.64* 1.75*   Estimated Creatinine Clearance: 62.2 mL/min (A) (by C-G formula based on SCr of 1.75 mg/dL (H)).  Assessment: 62 yo M on Coumadin PTA for PAF.  Admitted with HF exacerbation. INR is now less than two after holding coumadin, will start heparin drip for possible cardiac cath. No bleeding noted, CBC ok.  Goal of Therapy:  Heparin level 0.3-0.7 units/ml INR goal 2-3 when warfarin restarted Monitor platelets by anticoagulation protocol: Yes   Plan:  Heparin Bolus 4000 units x1 Start heparin gtt at 1500 units/hr Heparin level in 6 hours Daily HL, monitor CBC and for s/sx of bleeding  Addendum: Pt had blood bowel movement and per MD, hold heparin for two hours then restart. Would recommend using same gtt rate when turned back on (~1430).   Patterson Hammersmith PharmD PGY1 Pharmacy Practice Resident 10/02/2017 8:47 AM Phone: [phone]

## 2017-10-02 NOTE — Consult Note (Signed)
Advanced Heart Failure Team Consult Note   Primary Physician: Biagio Borg, MD PCP-Cardiologist:  Minus Breeding, MD  Reason for Consultation: CHF  HPI:    [name] is seen today for evaluation of CHF at the request of Dr. Percival Spanish.   62 yo with history of cardiomyopathy out of proportion to CAD/chronic systolic CHF, prior VT, CVA, paroxysmal atrial fibrillation, and CKD stage 3 was admitted from office yesterday with volume overload/CHF exacerbation.  [name] has a long history of cardiomyopathy.  Last cath in 9/14 showed distal LCx disease, but this did not explain the degree of his cardiomyopathy.  He has had difficult to control HTN.  Echo in 9/16 showed EF up to 35-40%, but more recent echo in 3/19 showed EF 20-25% with moderate LVH.  He was admitted last in 3/19 with CHF exacerbation.    For 2-3 weeks prior to admission, he reports worsening dyspnea with exertion.  Some heaviness across his chest with exertion. + Orthopnea, this has been chronic. Weight is up > 10 lbs.  He was started on Lasix 80 mg IV bid yesterday but has not diuresed much so far.  Creatinine 1.75 today, seems to run in 1.7-1.8 range.    He noted bright red blood on toilet paper this morning, has history of hemorrhoids.   Review of Systems: All systems reviewed and negative except as per HPI.   Home Medications Prior to Admission medications   Medication Sig Start Date End Date Taking? Authorizing Provider  acetaminophen (TYLENOL) 500 MG tablet Take 500-1,000 mg by mouth every 6 (six) hours as needed (for bilateral knee pain).   Yes [provider]  allopurinol (ZYLOPRIM) 300 MG tablet TAKE 1 TABLET BY MOUTH  DAILY 08/23/17  Yes Biagio Borg, MD  amiodarone (PACERONE) 200 MG tablet TAKE 1 TABLET BY MOUTH  DAILY 08/23/17  Yes Biagio Borg, MD  aspirin EC 81 MG EC tablet Take 1 tablet (81 mg total) by mouth daily. 08/01/17  Yes Lavina Hamman, MD  atorvastatin (LIPITOR) 80 MG tablet TAKE 1 TABLET  BY MOUTH AT  BEDTIME 08/23/17  Yes Biagio Borg, MD  carvedilol (COREG) 25 MG tablet Take 1 tablet (25 mg total) by mouth 2 (two) times daily with a meal. 07/31/17  Yes Lavina Hamman, MD  Cholecalciferol (VITAMIN D-3 PO) Take 1 capsule by mouth daily.   Yes [provider]  furosemide (LASIX) 80 MG tablet Take 1 tablet (80 mg total) by mouth daily. 07/31/17  Yes Lavina Hamman, MD  Insulin Glargine (LANTUS SOLOSTAR) 100 UNIT/ML Solostar Pen INJECT 10 UNITS INTO THE SKIN AT BEDTIME. 06/09/17  Yes Biagio Borg, MD  nitroGLYCERIN (NITROSTAT) 0.4 MG SL tablet PLACE 1 TABLET UNDER TONGUE EVERY 5 MINS X3 DOSES AS NEEDED FOR CHEST PAIN 07/12/17  Yes Biagio Borg, MD  potassium chloride SA (K-DUR,KLOR-CON) 20 MEQ tablet Take 1 tablet (20 mEq total) by mouth daily. 07/31/17  Yes Lavina Hamman, MD  traMADol (ULTRAM) 50 MG tablet Take 1 tablet (50 mg total) by mouth every 8 (eight) hours as needed. Patient taking differently: Take 50 mg by mouth every 8 (eight) hours as needed (for pain).  01/19/17  Yes Biagio Borg, MD  Turmeric 500 MG CAPS Take 1,000 mg by mouth daily as needed (inflammation).    Yes [provider]  warfarin (COUMADIN) 5 MG tablet Take 5-10 mg by mouth See admin instructions. 10mg  on MON and  FRI, 5mg  on all other days.   Yes [provider]    Past Medical History: 1. CKD: Stage 3.  2. HTN 3. Chronic systolic CHF: This has been present x years.  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  St Jude ICD.  - LHC (9/14): obstructive disease in distal LCx, medically managed (not likely causing cardiomyopathy).  - Echo (7/16) with EF 35-40%.  - Echo (3/19) with EF 20-25%, mild LV dilation, moderate LVH, moderately decreased RV systolic function.  4. OSA 5. CVA: 2000. 6. Gout 7. Atrial fibrillation: Paroxysmal.  He is on amiodarone and warfarin.  8. H/o VT: VT storm in 8/14.  St Jude ICD.   Past Surgical History: Past Surgical History:  Procedure  Laterality Date  . CARDIAC CATHETERIZATION     Nonobstructive coronary disease 2009  . CARDIAC DEFIBRILLATOR PLACEMENT     ICD-St. Jude  . IMPLANTABLE CARDIOVERTER DEFIBRILLATOR (ICD) GENERATOR CHANGE N/A 01/15/2014   Procedure: ICD GENERATOR CHANGE;  Surgeon: Evans Lance, MD;  Location: [facility] CATH LAB;  Service: Cardiovascular;  Laterality: N/A;  . LEFT AND RIGHT HEART CATHETERIZATION WITH CORONARY ANGIOGRAM N/A 01/03/2013   Procedure: LEFT AND RIGHT HEART CATHETERIZATION WITH CORONARY ANGIOGRAM;  Surgeon: Peter M Martinique, MD;  Location: [facility] CATH LAB;  Service: Cardiovascular;  Laterality: N/A;  . LIPOMA EXCISION      Family History: Family History  Problem Relation Age of Onset  . Coronary artery disease Mother   . Heart attack Mother   . Heart attack Maternal Uncle   . Heart attack Maternal Grandmother     Social History: Social History   Socioeconomic History  . Marital status: Married    Spouse name: Not on file  . Number of children: 2  . Years of education: Not on file  . Highest education level: Not on file  Occupational History  . Occupation: medically retired due to heart failure  Social Needs  . Financial resource strain: Not on file  . Food insecurity:    Worry: Not on file    Inability: Not on file  . Transportation needs:    Medical: Not on file    Non-medical: Not on file  Tobacco Use  . Smoking status: Former Smoker    Last attempt to quit: 05/04/1986    Years since quitting: 31.4  . Smokeless tobacco: Never Used  . Tobacco comment: Quit smoking 30 yrs ago. Smoked as teenager less than 1/2 ppd. Smoked x 4 years.  Substance and Sexual Activity  . Alcohol use: No    Comment: occasionally  . Drug use: No  . Sexual activity: Never  Lifestyle  . Physical activity:    Days per week: Not on file    Minutes per session: Not on file  . Stress: Not on file  Relationships  . Social connections:    Talks on phone: Not on file    Gets together: Not on file     Attends religious service: Not on file    Active member of club or organization: Not on file    Attends meetings of clubs or organizations: Not on file    Relationship status: Not on file  Other Topics Concern  . Not on file  Social History Narrative  . Not on file    Allergies:  No Known Allergies  Objective:    Vital Signs:   Temp:  [97.7 F (36.5 C)-98 F (36.7 C)] 97.8 F (36.6 C) (06/01 1221) Pulse Rate:  [  70-79] 79 (06/01 1221) Resp:  [18-20] 20 (06/01 1221) BP: (103-138)/(80-98) 103/84 (06/01 1221) SpO2:  [78 %-95 %] 91 % (06/01 1221) Weight:  [289 lb 12.8 oz (131.5 kg)] 289 lb 12.8 oz (131.5 kg) (06/01 0328) Last BM Date: 09/30/17  Weight change: Filed Weights   10/01/17 1020 10/02/17 0328  Weight: 290 lb 8 oz (131.8 kg) 289 lb 12.8 oz (131.5 kg)    Intake/Output:   Intake/Output Summary (Last 24 hours) at 10/02/2017 1223 Last data filed at 10/02/2017 1150 Gross per 24 hour  Intake 843 ml  Output 550 ml  Net 293 ml      Physical Exam    General:  Well appearing. No resp difficulty HEENT: normal Neck: supple. JVP 16 cm. Carotids 2+ bilat; no bruits. No lymphadenopathy or thyromegaly appreciated. Cor: PMI lateral. Regular rate & rhythm. No rubs, gallops or murmurs. Lungs: clear Abdomen: soft, nontender, nondistended. No hepatosplenomegaly. No bruits or masses. Good bowel sounds. Extremities: no cyanosis, clubbing, rash. 1+ edema to knees bilaterally.  Neuro: alert & orientedx3, cranial nerves grossly intact. moves all 4 extremities w/o difficulty. Affect pleasant   Telemetry   NSR in 80s, personally reviewed.    Labs   Basic Metabolic Panel: Recent Labs  Lab 10/01/17 1346 10/02/17 0720  NA 146* 145  K 3.7 3.5  CL 106 105  CO2 34* 31  GLUCOSE 105* 107*  BUN 19 20  CREATININE 1.64* 1.75*  CALCIUM 8.4* 8.4*    Liver Function Tests: Recent Labs  Lab 10/01/17 1346  AST 33  ALT 60  ALKPHOS 57  BILITOT 2.0*  PROT 5.2*  ALBUMIN 2.9*    No results for input(s): LIPASE, AMYLASE in the last 168 hours. No results for input(s): AMMONIA in the last 168 hours.  CBC: Recent Labs  Lab 10/01/17 1346  WBC 5.2  NEUTROABS 3.9  HGB 12.5*  HCT 42.4  MCV 95.5  PLT 212    Cardiac Enzymes: No results for input(s): CKTOTAL, CKMB, CKMBINDEX, TROPONINI in the last 168 hours.  BNP: BNP (last 3 results) Recent Labs    07/27/17 1114 10/01/17 1346  BNP 787.1* 1,883.8*    ProBNP (last 3 results) No results for input(s): PROBNP in the last 8760 hours.   CBG: Recent Labs  Lab 10/01/17 1745 10/01/17 2110 10/01/17 2216 10/02/17 0752 10/02/17 1125  GLUCAP 168* 67 115* 92 127*    Coagulation Studies: Recent Labs    10/01/17 1346 10/02/17 0720  LABPROT 23.9* 20.9*  INR 2.16 1.82     Imaging   Dg Chest 2 View  Result Date: 10/02/2017 CLINICAL DATA:  Shortness of breath and volume overload. Patient admitted 10/01/2017. EXAM: CHEST - 2 VIEW COMPARISON:  PA and lateral chest 07/27/2017. FINDINGS: There is cardiomegaly and pulmonary edema. Tiny bilateral effusions are seen. No pneumothorax. AICD is in place. Midthoracic compression fracture is new since the prior exam. Vertebral body height loss is approximately 70%. IMPRESSION: Cardiomegaly and pulmonary edema. Midthoracic spine compression fracture is new since the prior study. Electronically Signed   By: Inge Rise M.D.   On: 10/02/2017 09:26      Medications:     Current Medications: . allopurinol  300 mg Oral Daily  . amiodarone  200 mg Oral Daily  . aspirin EC  81 mg Oral Daily  . atorvastatin  80 mg Oral QHS  . carvedilol  25 mg Oral BID WC  . cholecalciferol  1,000 Units Oral Daily  . furosemide  80  mg Intravenous BID  . insulin aspart  0-9 Units Subcutaneous TID WC  . insulin glargine  10 Units Subcutaneous Q2200  . isosorbide-hydrALAZINE  0.5 tablet Oral TID  . metolazone  2.5 mg Oral Once  . potassium chloride SA  20 mEq Oral Daily  .  sodium chloride flush  3 mL Intravenous Q12H     Infusions: . sodium chloride    . heparin 1,500 Units/hr (10/02/17 0951)      Assessment/Plan   1. 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  Echo in 4/19 showed worsening of LV systolic function, EF down to 20-25%.  There was moderate LVH.  Cause of cardiomyopathy uncertain.  Patient has had difficult-to-control HTN.  Rare ETOH use.  Never worked up for amyloidosis. He has a Research officer, political party ICD, cannot get cardiac MRI.  Narrow QRS in past, not candidate for CRT upgrade. On exam, he remains volume overloaded.  He has not diuresed well so far.  - Continue Lasix 80 mg IV bid.  I will give a dose of metolazone 2.5 x 1 today.  - Continue home Coreg.  - Add Bidil 0.5 tab tid.  Holding off on ACEI/ARB/ARNI for now with need for diuresis and elevated creatinine.  - I will send a myeloma panel and will arrange for PYP scan to work up amyloidosis (has moderate LVH, but this may be due to HTN).  - Given fall in EF and HF decompensation, will plan R/LHC on Monday (if creatinine remains stable).  Will need to limit contrast. We discussed risks/benefits of procedure and he agrees.  2. CKD: Stage 3.  Creatinine stable so far with diuresis, follow carefully.  3. CAD: Distal LCx disease on 9/14 cath. With fall in EF and worsening symptoms, would like to make sure this has not worsened.   - Continue ASA 81 and statin.  - If creatinine remains stable, cath Monday.  4. Rectal bleeding: Noted on toilet paper today.  He is on heparin gtt while off warfarin. He says that he has a history of hemorrhoids.  Stable hgb at 12.5 yesterday.  - Needs CBC today.  - Hold heparin gtt for a couple of hours.  5. Atrial fibrillation: Paroxysmal. He is in NSR.  Given history of CVA, should get heparin bridge while off warfarin.  - Warfarin held, INR 1.8. He is on heparin gtt  (holding couple of hours with rectal bleeding as above).  6. H/o VT: Has ICD.   Length of Stay: 1  [name], MD  10/02/2017, 12:23 PM  Advanced Heart Failure Team Pager [phone] (M-F; 7a - 4p)  Please contact [facility] for night-coverage after hours (4p -7a ) and weekends on [email or web address]

## 2017-10-02 NOTE — Progress Notes (Signed)
ANTICOAGULATION CONSULT NOTE  Pharmacy Consult for Heparin Indication: atrial fibrillation  Patient Measurements: Height: 6' (182.9 cm) Weight: 289 lb 12.8 oz (131.5 kg)(scale a) IBW/kg (Calculated) : 77.6 Heparin Dosing Weight: 107.4 kg  Labs: Recent Labs    10/01/17 1346 10/02/17 0720 10/02/17 1226 10/02/17 2118  HGB 12.5*  --  13.2  --   HCT 42.4  --  44.0  --   PLT 212  --  223  --   LABPROT 23.9* 20.9*  --   --   INR 2.16 1.82  --   --   HEPARINUNFRC  --   --   --  0.31  CREATININE 1.64* 1.75*  --   --    Estimated Creatinine Clearance: 62.2 mL/min (A) (by C-G formula based on SCr of 1.75 mg/dL (H)).  Assessment: 62 yo M on Coumadin PTA for PAF.  Admitted with HF exacerbation. Heparin started in anticipation of cath. INR now less than two after holding coumadin. Heparin held x 2 hours after patient had bloody bowel movement per MD request. Heparin level now therapeutic on resumption at 0.31. CBC stable. No further bleeding documented.  Goal of Therapy:  Heparin level 0.3-0.7 units/ml Monitor platelets by anticoagulation protocol: Yes   Plan:  Continue heparin gtt at 1500 units/hr Confirmatory heparin level with AM labs Monitor daily heparin level and CBC, s/sx bleeding Cath planned for 6/3  Elicia Lamp, PharmD, BCPS Clinical Pharmacist 10/02/2017 10:06 PM

## 2017-10-03 LAB — CBC WITH DIFFERENTIAL/PLATELET
Abs Immature Granulocytes: 0 10*3/uL (ref 0.0–0.1)
BASOS PCT: 0 %
Basophils Absolute: 0 10*3/uL (ref 0.0–0.1)
EOS ABS: 0.1 10*3/uL (ref 0.0–0.7)
Eosinophils Relative: 2 %
HEMATOCRIT: 42 % (ref 39.0–52.0)
Hemoglobin: 12.7 g/dL — ABNORMAL LOW (ref 13.0–17.0)
Immature Granulocytes: 0 %
LYMPHS ABS: 0.8 10*3/uL (ref 0.7–4.0)
Lymphocytes Relative: 14 %
MCH: 28.9 pg (ref 26.0–34.0)
MCHC: 30.2 g/dL (ref 30.0–36.0)
MCV: 95.5 fL (ref 78.0–100.0)
MONO ABS: 0.6 10*3/uL (ref 0.1–1.0)
MONOS PCT: 11 %
Neutro Abs: 4.1 10*3/uL (ref 1.7–7.7)
Neutrophils Relative %: 73 %
PLATELETS: 212 10*3/uL (ref 150–400)
RBC: 4.4 MIL/uL (ref 4.22–5.81)
RDW: 16.6 % — AB (ref 11.5–15.5)
WBC: 5.6 10*3/uL (ref 4.0–10.5)

## 2017-10-03 LAB — GLUCOSE, CAPILLARY
GLUCOSE-CAPILLARY: 140 mg/dL — AB (ref 65–99)
GLUCOSE-CAPILLARY: 149 mg/dL — AB (ref 65–99)
Glucose-Capillary: 98 mg/dL (ref 65–99)
Glucose-Capillary: 109 mg/dL — ABNORMAL HIGH (ref 65–99)

## 2017-10-03 LAB — BASIC METABOLIC PANEL
ANION GAP: 8 (ref 5–15)
Anion gap: 9 (ref 5–15)
BUN: 19 mg/dL (ref 6–20)
BUN: 17 mg/dL (ref 6–20)
CHLORIDE: 100 mmol/L — AB (ref 101–111)
CO2: 36 mmol/L — ABNORMAL HIGH (ref 22–32)
CO2: 39 mmol/L — AB (ref 22–32)
CREATININE: 1.59 mg/dL — AB (ref 0.61–1.24)
Calcium: 8.4 mg/dL — ABNORMAL LOW (ref 8.9–10.3)
Calcium: 8.5 mg/dL — ABNORMAL LOW (ref 8.9–10.3)
Chloride: 96 mmol/L — ABNORMAL LOW (ref 101–111)
Creatinine, Ser: 1.86 mg/dL — ABNORMAL HIGH (ref 0.61–1.24)
GFR calc Af Amer: 43 mL/min — ABNORMAL LOW (ref >60)
GFR calc non Af Amer: 45 mL/min — ABNORMAL LOW (ref >60)
GFR calc non Af Amer: 37 mL/min — ABNORMAL LOW (ref >60)
GFR, EST AFRICAN AMERICAN: 52 mL/min — AB (ref >60)
GLUCOSE: 104 mg/dL — AB (ref 65–99)
Glucose, Bld: 101 mg/dL — ABNORMAL HIGH (ref 65–99)
POTASSIUM: 3.4 mmol/L — AB (ref 3.5–5.1)
Potassium: 3.1 mmol/L — ABNORMAL LOW (ref 3.5–5.1)
SODIUM: 145 mmol/L (ref 135–145)
Sodium: 143 mmol/L (ref 135–145)

## 2017-10-03 LAB — PROTIME-INR
INR: 1.63
PROTHROMBIN TIME: 19.2 s — AB (ref 11.4–15.2)

## 2017-10-03 LAB — HEPARIN LEVEL (UNFRACTIONATED): Heparin Unfractionated: 0.46 IU/mL (ref 0.30–0.70)

## 2017-10-03 MED ORDER — POTASSIUM CHLORIDE CRYS ER 20 MEQ PO TBCR
40.0000 meq | EXTENDED_RELEASE_TABLET | Freq: Once | ORAL | Status: DC
  Filled 2017-10-03: qty 2

## 2017-10-03 MED ORDER — ASPIRIN EC 81 MG PO TBEC
81.0000 mg | DELAYED_RELEASE_TABLET | Freq: Every day | ORAL | Status: DC
  Administered 2017-10-05 – 2017-10-07 (×3): 81 mg via ORAL
  Filled 2017-10-03 (×3): qty 1

## 2017-10-03 MED ORDER — METOLAZONE 2.5 MG PO TABS
2.5000 mg | ORAL_TABLET | Freq: Once | ORAL | Status: AC
  Administered 2017-10-03: 2.5 mg via ORAL
  Filled 2017-10-03: qty 1

## 2017-10-03 MED ORDER — FUROSEMIDE 10 MG/ML IJ SOLN
80.0000 mg | Freq: Two times a day (BID) | INTRAMUSCULAR | Status: AC
  Administered 2017-10-03: 80 mg via INTRAVENOUS
  Filled 2017-10-03: qty 8

## 2017-10-03 MED ORDER — ASPIRIN 81 MG PO CHEW
81.0000 mg | CHEWABLE_TABLET | ORAL | Status: AC
  Administered 2017-10-04: 81 mg via ORAL
  Filled 2017-10-03: qty 1

## 2017-10-03 MED ORDER — ISOSORB DINITRATE-HYDRALAZINE 20-37.5 MG PO TABS
0.5000 | ORAL_TABLET | Freq: Three times a day (TID) | ORAL | Status: DC
  Administered 2017-10-03 – 2017-10-04 (×4): 0.5 via ORAL
  Filled 2017-10-03 (×4): qty 1

## 2017-10-03 MED ORDER — SODIUM CHLORIDE 0.9% FLUSH: 3.0000 mL | INTRAVENOUS | Status: DC | PRN

## 2017-10-03 MED ORDER — POTASSIUM CHLORIDE CRYS ER 20 MEQ PO TBCR
40.0000 meq | EXTENDED_RELEASE_TABLET | Freq: Once | ORAL | Status: AC
  Administered 2017-10-03: 40 meq via ORAL

## 2017-10-03 MED ORDER — SODIUM CHLORIDE 0.9% FLUSH
3.0000 mL | Freq: Two times a day (BID) | INTRAVENOUS | Status: DC
  Administered 2017-10-03 – 2017-10-04 (×2): 3 mL via INTRAVENOUS

## 2017-10-03 MED ORDER — ISOSORB DINITRATE-HYDRALAZINE 20-37.5 MG PO TABS: 1.0000 | ORAL_TABLET | Freq: Three times a day (TID) | ORAL | Status: DC

## 2017-10-03 MED ORDER — POTASSIUM CHLORIDE CRYS ER 20 MEQ PO TBCR
40.0000 meq | EXTENDED_RELEASE_TABLET | Freq: Once | ORAL | Status: AC
  Administered 2017-10-03: 40 meq via ORAL
  Filled 2017-10-03: qty 2

## 2017-10-03 MED ORDER — SODIUM CHLORIDE 0.9 % IV SOLN: 250.0000 mL | INTRAVENOUS | Status: DC | PRN

## 2017-10-03 MED ORDER — SODIUM CHLORIDE 0.9 % IV SOLN
INTRAVENOUS | Status: DC
  Administered 2017-10-04: 06:00:00 via INTRAVENOUS

## 2017-10-03 NOTE — Progress Notes (Signed)
Patient alarmed 10 beats vtach. Patient asymptomatic.  [name] paged. Ordered to give K-dur at 5pm now and to check bmp in 2 hours.  Will continue to monitor patient.

## 2017-10-03 NOTE — Progress Notes (Signed)
Patient ID: [name], male   DOB: 03-26-1956, 62 y.o.   MRN: [id number]     Advanced Heart Failure Rounding Note  PCP-Cardiologist: Minus Breeding, MD   Subjective:    Good diuresis yesterday, weight down 4 lbs.  Creatinine lower at 1.59.  Did not take Bidil yesterday, says that he is not taking it at home.   Breathing improving but still feels like he has fluid present.    Objective:   Weight Range: 285 lb 6.4 oz (129.5 kg) Body mass index is 38.71 kg/m.   Vital Signs:   Temp:  [97.6 F (36.4 C)-98.5 F (36.9 C)] 98.2 F (36.8 C) (06/02 0827) Pulse Rate:  [62-90] 68 (06/02 0827) Resp:  [16-20] 20 (06/02 0827) BP: (103-140)/(79-96) 127/86 (06/02 0827) SpO2:  [91 %-97 %] 95 % (06/02 0827) Weight:  [285 lb 6.4 oz (129.5 kg)] 285 lb 6.4 oz (129.5 kg) (06/02 0600) Last BM Date: 10/02/17  Weight change: Filed Weights   10/01/17 1020 10/02/17 0328 10/03/17 0600  Weight: 290 lb 8 oz (131.8 kg) 289 lb 12.8 oz (131.5 kg) 285 lb 6.4 oz (129.5 kg)    Intake/Output:   Intake/Output Summary (Last 24 hours) at 10/03/2017 1115 Last data filed at 10/03/2017 1101 Gross per 24 hour  Intake 1164.5 ml  Output 5630 ml  Net -4465.5 ml      Physical Exam    General:  Well appearing. No resp difficulty HEENT: Normal Neck: Supple. JVP 14 cm. Carotids 2+ bilat; no bruits. No lymphadenopathy or thyromegaly appreciated. Cor: PMI nonpalpable. Regular rate & rhythm. No rubs, gallops or murmurs. Lungs: Clear Abdomen: Soft, nontender, nondistended. No hepatosplenomegaly. No bruits or masses. Good bowel sounds. Extremities: No cyanosis, clubbing, rash. 1+ edema 1/2 to knees bilaterally.  Neuro: Alert & orientedx3, cranial nerves grossly intact. moves all 4 extremities w/o difficulty. Affect pleasant   Telemetry   NSR (personally reviewed)  Labs    CBC Recent Labs    10/01/17 1346 10/02/17 1226 10/03/17 0520  WBC 5.2 5.8 5.6  NEUTROABS 3.9  --  4.1  HGB 12.5* 13.2 12.7*    HCT 42.4 44.0 42.0  MCV 95.5 95.4 95.5  PLT 212 223 672   Basic Metabolic Panel Recent Labs    10/02/17 0720 10/03/17 0520  NA 145 145  K 3.5 3.1*  CL 105 100*  CO2 31 36*  GLUCOSE 107* 101*  BUN 20 19  CREATININE 1.75* 1.59*  CALCIUM 8.4* 8.4*   Liver Function Tests Recent Labs    10/01/17 1346  AST 33  ALT 60  ALKPHOS 57  BILITOT 2.0*  PROT 5.2*  ALBUMIN 2.9*   No results for input(s): LIPASE, AMYLASE in the last 72 hours. Cardiac Enzymes No results for input(s): CKTOTAL, CKMB, CKMBINDEX, TROPONINI in the last 72 hours.  BNP: BNP (last 3 results) Recent Labs    07/27/17 1114 10/01/17 1346  BNP 787.1* 1,883.8*    ProBNP (last 3 results) No results for input(s): PROBNP in the last 8760 hours.   D-Dimer No results for input(s): DDIMER in the last 72 hours. Hemoglobin A1C No results for input(s): HGBA1C in the last 72 hours. Fasting Lipid Panel Recent Labs    10/02/17 0720  CHOL 114  HDL 24*  LDLCALC 79  TRIG 55  CHOLHDL 4.8   Thyroid Function Tests Recent Labs    10/01/17 1346  TSH 0.860    Other results:   Imaging     No results found.  Medications:     Scheduled Medications: . allopurinol  300 mg Oral Daily  . amiodarone  200 mg Oral Daily  . aspirin EC  81 mg Oral Daily  . atorvastatin  80 mg Oral QHS  . carvedilol  25 mg Oral BID WC  . cholecalciferol  1,000 Units Oral Daily  . furosemide  80 mg Intravenous BID  . insulin aspart  0-9 Units Subcutaneous TID WC  . insulin glargine  10 Units Subcutaneous Q2200  . isosorbide-hydrALAZINE  0.5 tablet Oral TID  . metolazone  2.5 mg Oral Once  . potassium chloride SA  20 mEq Oral Daily  . sodium chloride flush  3 mL Intravenous Q12H     Infusions: . sodium chloride    . heparin 1,500 Units/hr (10/03/17 0109)     PRN Medications:  sodium chloride, acetaminophen, ondansetron (ZOFRAN) IV, sodium chloride flush, traMADol    Assessment/Plan   1. Acute on chronic  systolic CHF: Thought to be nonischemic cardiomyopathy (had coronary disease on 9/14 cath but out of proportion to cardiomyopathy).  Echo in 4/19 showed worsening of LV systolic function, EF down to 20-25%.  There was moderate LVH.  Cause of cardiomyopathy uncertain.  Patient has had difficult-to-control HTN.  Rare ETOH use.  Never worked up for amyloidosis. He has a Research officer, political party ICD, cannot get cardiac MRI.  Narrow QRS in past, not candidate for CRT upgrade. On exam, he remains volume overloaded but he diuresed well yesterday and weight is down.  - Continue Lasix 80 mg IV bid today.  I will give a dose of metolazone 2.5 x 1 today again.  - Continue home Coreg.  - Add Bidil 0.5 tab tid, I talked to him about the new medication and he will start it.  Holding off on ACEI/ARB/ARNI for now with need for diuresis and elevated creatinine.  - Add spironolactone tomorrow if creatinine stable.  - I sent a myeloma panel and will arrange for PYP scan to work up amyloidosis (has moderate LVH, but this may be due to HTN).  - Given fall in EF and HF decompensation, will plan R/LHC on Monday (if creatinine remains stable).  Will need to limit contrast. We discussed risks/benefits of procedure and he agrees.  Will hold Lasix Monday morning.  2. CKD: Stage 3.  Creatinine lower today with diuresis, follow carefully.  3. CAD: Distal LCx disease on 9/14 cath. With fall in EF and worsening symptoms, would like to make sure this has not worsened.   - Continue ASA 81 and statin.  - If creatinine remains stable, cath Monday.  4. Rectal bleeding: Noted on toilet paper Saturday.  He is on heparin gtt while off warfarin. He says that he has a history of hemorrhoids.  Stable hgb at 12.7. No further bleeding.  5. Atrial fibrillation: Paroxysmal. He is in NSR.  Given history of CVA, should get heparin bridge while off warfarin.  - Warfarin held, INR 1.6. He is on heparin gtt.  6. H/o VT: Has ICD.   Length of Stay: 2  [name],  MD  10/03/2017, 11:15 AM  Advanced Heart Failure Team Pager [phone] (M-F; 7a - 4p)  Please contact [facility] for night-coverage after hours (4p -7a ) and weekends on [email or web address]

## 2017-10-03 NOTE — Progress Notes (Signed)
ANTICOAGULATION CONSULT NOTE  Pharmacy Consult for Heparin Indication: atrial fibrillation  Patient Measurements: Height: 6' (182.9 cm) Weight: 285 lb 6.4 oz (129.5 kg)(scale a) IBW/kg (Calculated) : 77.6 Heparin Dosing Weight: 107.4 kg  Labs: Recent Labs    10/01/17 1346 10/02/17 0720 10/02/17 1226 10/02/17 2118 10/03/17 0520  HGB 12.5*  --  13.2  --  12.7*  HCT 42.4  --  44.0  --  42.0  PLT 212  --  223  --  212  LABPROT 23.9* 20.9*  --   --  19.2*  INR 2.16 1.82  --   --  1.63  HEPARINUNFRC  --   --   --  0.31 0.46  CREATININE 1.64* 1.75*  --   --  1.59*   Estimated Creatinine Clearance: 67.9 mL/min (A) (by C-G formula based on SCr of 1.59 mg/dL (H)).  Assessment: 62 yo M on Coumadin PTA for PAF.  Admitted with HF exacerbation. Heparin started in anticipation of cath after INR <2 on 6/1. Shortly after heparin was restarted, pt had a bloody bowel movement (light) and infusion was held for two hours. No further episodes have occurred after speaking with his nurse.  Heparin level remains therapeutic, cbc stable, and no further reports of bleeding. Continue heparin at current infusion rate.  Goal of Therapy:  Heparin level 0.3-0.7 units/ml Monitor platelets by anticoagulation protocol: Yes   Plan:  Continue heparin gtt at 1500 units/hr Monitor daily heparin level and CBC, s/sx bleeding Cath planned for 6/3   Patterson Hammersmith PharmD PGY1 Pharmacy Practice Resident 10/03/2017 7:59 AM Phone: [phone]

## 2017-10-04 ENCOUNTER — Inpatient Hospital Stay (HOSPITAL_COMMUNITY)
Admission: AD | Disposition: A | Discharge status: Home / Self Care | Payer: Self-pay | Source: Ambulatory Visit | Attending: Cardiology

## 2017-10-04 ENCOUNTER — Encounter (HOSPITAL_COMMUNITY): Payer: Self-pay | Admitting: Cardiology

## 2017-10-04 DIAGNOSIS — I2510 Atherosclerotic heart disease of native coronary artery without angina pectoris: Secondary | ICD-10-CM

## 2017-10-04 DIAGNOSIS — I509 Heart failure, unspecified: Secondary | ICD-10-CM

## 2017-10-04 HISTORY — PX: RIGHT/LEFT HEART CATH AND CORONARY ANGIOGRAPHY: CATH118266

## 2017-10-04 LAB — POCT I-STAT 3, VENOUS BLOOD GAS (G3P V)
ACID-BASE EXCESS: 9 mmol/L — AB (ref 0.0–2.0)
Acid-Base Excess: 8 mmol/L — ABNORMAL HIGH (ref 0.0–2.0)
Bicarbonate: 35.5 mmol/L — ABNORMAL HIGH (ref 20.0–28.0)
Bicarbonate: 36.7 mmol/L — ABNORMAL HIGH (ref 20.0–28.0)
O2 SAT: 55 %
O2 Saturation: 55 %
TCO2: 39 mmol/L — AB (ref 22–32)
TCO2: 37 mmol/L — ABNORMAL HIGH (ref 22–32)
pCO2, Ven: 64.1 mmHg — ABNORMAL HIGH (ref 44.0–60.0)
pCO2, Ven: 65.6 mmHg — ABNORMAL HIGH (ref 44.0–60.0)
pH, Ven: 7.351 (ref 7.250–7.430)
pH, Ven: 7.355 (ref 7.250–7.430)
pO2, Ven: 31 mmHg — CL (ref 32.0–45.0)
pO2, Ven: 32 mmHg (ref 32.0–45.0)

## 2017-10-04 LAB — CBC WITH DIFFERENTIAL/PLATELET
Abs Immature Granulocytes: 0 10*3/uL (ref 0.0–0.1)
BASOS ABS: 0 10*3/uL (ref 0.0–0.1)
Basophils Relative: 0 %
EOS PCT: 2 %
Eosinophils Absolute: 0.1 10*3/uL (ref 0.0–0.7)
HEMATOCRIT: 42.9 % (ref 39.0–52.0)
HEMOGLOBIN: 12.9 g/dL — AB (ref 13.0–17.0)
Immature Granulocytes: 0 %
LYMPHS ABS: 0.9 10*3/uL (ref 0.7–4.0)
LYMPHS PCT: 13 %
MCH: 28.6 pg (ref 26.0–34.0)
MCHC: 30.1 g/dL (ref 30.0–36.0)
MCV: 95.1 fL (ref 78.0–100.0)
MONO ABS: 0.6 10*3/uL (ref 0.1–1.0)
Monocytes Relative: 9 %
Neutro Abs: 4.9 10*3/uL (ref 1.7–7.7)
Neutrophils Relative %: 76 %
Platelets: 219 10*3/uL (ref 150–400)
RBC: 4.51 MIL/uL (ref 4.22–5.81)
RDW: 16.4 % — ABNORMAL HIGH (ref 11.5–15.5)
WBC: 6.5 10*3/uL (ref 4.0–10.5)

## 2017-10-04 LAB — MAGNESIUM: MAGNESIUM: 1.8 mg/dL (ref 1.7–2.4)

## 2017-10-04 LAB — GLUCOSE, CAPILLARY
GLUCOSE-CAPILLARY: 93 mg/dL (ref 65–99)
GLUCOSE-CAPILLARY: 132 mg/dL — AB (ref 65–99)
Glucose-Capillary: 98 mg/dL (ref 65–99)
Glucose-Capillary: 118 mg/dL — ABNORMAL HIGH (ref 65–99)

## 2017-10-04 LAB — PROTIME-INR
INR: 1.36
Prothrombin Time: 16.7 seconds — ABNORMAL HIGH (ref 11.4–15.2)

## 2017-10-04 LAB — BASIC METABOLIC PANEL
ANION GAP: 5 (ref 5–15)
BUN: 21 mg/dL — ABNORMAL HIGH (ref 6–20)
CALCIUM: 8.3 mg/dL — AB (ref 8.9–10.3)
CO2: 39 mmol/L — ABNORMAL HIGH (ref 22–32)
Chloride: 98 mmol/L — ABNORMAL LOW (ref 101–111)
Creatinine, Ser: 1.91 mg/dL — ABNORMAL HIGH (ref 0.61–1.24)
GFR, EST AFRICAN AMERICAN: 42 mL/min — AB (ref >60)
GFR, EST NON AFRICAN AMERICAN: 36 mL/min — AB (ref >60)
GLUCOSE: 114 mg/dL — AB (ref 65–99)
Potassium: 3.6 mmol/L (ref 3.5–5.1)
SODIUM: 142 mmol/L (ref 135–145)

## 2017-10-04 LAB — HEPARIN LEVEL (UNFRACTIONATED): HEPARIN UNFRACTIONATED: 0.37 [IU]/mL (ref 0.30–0.70)

## 2017-10-04 SURGERY — RIGHT/LEFT HEART CATH AND CORONARY ANGIOGRAPHY
Anesthesia: LOCAL

## 2017-10-04 MED ORDER — SODIUM CHLORIDE 0.9% FLUSH: 3.0000 mL | INTRAVENOUS | Status: DC | PRN

## 2017-10-04 MED ORDER — VERAPAMIL HCL 2.5 MG/ML IV SOLN
INTRAVENOUS | Status: DC | PRN
  Administered 2017-10-04: 10 mL via INTRA_ARTERIAL

## 2017-10-04 MED ORDER — HEPARIN (PORCINE) IN NACL 1000-0.9 UT/500ML-% IV SOLN
INTRAVENOUS | Status: AC
  Filled 2017-10-04: qty 1000

## 2017-10-04 MED ORDER — WARFARIN SODIUM 10 MG PO TABS
10.0000 mg | ORAL_TABLET | Freq: Once | ORAL | Status: AC
  Administered 2017-10-04: 10 mg via ORAL
  Filled 2017-10-04: qty 1

## 2017-10-04 MED ORDER — VERAPAMIL HCL 2.5 MG/ML IV SOLN
INTRAVENOUS | Status: AC
  Filled 2017-10-04: qty 2

## 2017-10-04 MED ORDER — MIDAZOLAM HCL 2 MG/2ML IJ SOLN
INTRAMUSCULAR | Status: DC | PRN
  Administered 2017-10-04: 1 mg via INTRAVENOUS

## 2017-10-04 MED ORDER — POTASSIUM CHLORIDE CRYS ER 20 MEQ PO TBCR
20.0000 meq | EXTENDED_RELEASE_TABLET | Freq: Once | ORAL | Status: AC
  Administered 2017-10-04: 20 meq via ORAL
  Filled 2017-10-04: qty 1

## 2017-10-04 MED ORDER — SODIUM CHLORIDE 0.9 % IV SOLN: 250.0000 mL | INTRAVENOUS | Status: DC | PRN

## 2017-10-04 MED ORDER — FENTANYL CITRATE (PF) 100 MCG/2ML IJ SOLN
INTRAMUSCULAR | Status: DC | PRN
  Administered 2017-10-04: 25 ug via INTRAVENOUS

## 2017-10-04 MED ORDER — HEPARIN SODIUM (PORCINE) 1000 UNIT/ML IJ SOLN
INTRAMUSCULAR | Status: AC
  Filled 2017-10-04: qty 1

## 2017-10-04 MED ORDER — ACETAMINOPHEN 325 MG PO TABS: 650.0000 mg | ORAL_TABLET | ORAL | Status: DC | PRN

## 2017-10-04 MED ORDER — WARFARIN - PHARMACIST DOSING INPATIENT
Freq: Every day | Status: DC
  Administered 2017-10-05 – 2017-10-07 (×2)

## 2017-10-04 MED ORDER — IOHEXOL 350 MG/ML SOLN
INTRAVENOUS | Status: DC | PRN
  Administered 2017-10-04: 40 mL via INTRA_ARTERIAL

## 2017-10-04 MED ORDER — MIDAZOLAM HCL 2 MG/2ML IJ SOLN
INTRAMUSCULAR | Status: AC
  Filled 2017-10-04: qty 2

## 2017-10-04 MED ORDER — ONDANSETRON HCL 4 MG/2ML IJ SOLN: 4.0000 mg | Freq: Four times a day (QID) | INTRAMUSCULAR | Status: DC | PRN

## 2017-10-04 MED ORDER — METOLAZONE 2.5 MG PO TABS
2.5000 mg | ORAL_TABLET | Freq: Once | ORAL | Status: AC
Start: 2017-10-04 — End: 2017-10-04
  Administered 2017-10-04: 2.5 mg via ORAL
  Filled 2017-10-04: qty 1

## 2017-10-04 MED ORDER — SODIUM CHLORIDE 0.9% FLUSH
3.0000 mL | Freq: Two times a day (BID) | INTRAVENOUS | Status: DC
  Administered 2017-10-04: 3 mL via INTRAVENOUS

## 2017-10-04 MED ORDER — LIDOCAINE HCL (PF) 1 % IJ SOLN
INTRAMUSCULAR | Status: AC
  Filled 2017-10-04: qty 30

## 2017-10-04 MED ORDER — HEPARIN (PORCINE) IN NACL 2-0.9 UNITS/ML
INTRAMUSCULAR | Status: AC | PRN
  Administered 2017-10-04 (×2): 500 mL

## 2017-10-04 MED ORDER — LIDOCAINE HCL (PF) 1 % IJ SOLN
INTRAMUSCULAR | Status: DC | PRN
  Administered 2017-10-04: 5 mL

## 2017-10-04 MED ORDER — SODIUM CHLORIDE 0.9 % IV SOLN
INTRAVENOUS | Status: DC
  Administered 2017-10-04: 09:00:00 via INTRAVENOUS

## 2017-10-04 MED ORDER — HEPARIN (PORCINE) IN NACL 100-0.45 UNIT/ML-% IJ SOLN
1350.0000 [IU]/h | INTRAMUSCULAR | Status: AC
  Administered 2017-10-04: 1500 [IU]/h via INTRAVENOUS
  Administered 2017-10-05 – 2017-10-07 (×3): 1600 [IU]/h via INTRAVENOUS
  Filled 2017-10-04 (×4): qty 250

## 2017-10-04 MED ORDER — WARFARIN SODIUM 7.5 MG PO TABS: 7.5000 mg | ORAL_TABLET | Freq: Once | ORAL | Status: DC

## 2017-10-04 MED ORDER — HEPARIN SODIUM (PORCINE) 1000 UNIT/ML IJ SOLN
INTRAMUSCULAR | Status: DC | PRN
  Administered 2017-10-04: 6000 [IU] via INTRAVENOUS

## 2017-10-04 MED ORDER — FUROSEMIDE 10 MG/ML IJ SOLN
80.0000 mg | Freq: Two times a day (BID) | INTRAMUSCULAR | Status: DC
  Administered 2017-10-04 – 2017-10-07 (×6): 80 mg via INTRAVENOUS
  Filled 2017-10-04 (×6): qty 8

## 2017-10-04 MED ORDER — FENTANYL CITRATE (PF) 100 MCG/2ML IJ SOLN
INTRAMUSCULAR | Status: AC
  Filled 2017-10-04: qty 2

## 2017-10-04 MED ORDER — ISOSORB DINITRATE-HYDRALAZINE 20-37.5 MG PO TABS
1.0000 | ORAL_TABLET | Freq: Three times a day (TID) | ORAL | Status: DC
  Administered 2017-10-04 – 2017-10-07 (×7): 1 via ORAL
  Filled 2017-10-04 (×8): qty 1

## 2017-10-04 SURGICAL SUPPLY — 13 items
CATH BALLN WEDGE 5F 110CM (CATHETERS) ×2 IMPLANT
CATH IMPULSE 5F ANG/FL3.5 (CATHETERS) ×2 IMPLANT
DEVICE RAD COMP TR BAND LRG (VASCULAR PRODUCTS) ×2 IMPLANT
GUIDEWIRE .025 260CM (WIRE) ×2 IMPLANT
GUIDEWIRE INQWIRE 1.5J.035X260 (WIRE) ×1 IMPLANT
INQWIRE 1.5J .035X260CM (WIRE) ×2
KIT HEART LEFT (KITS) ×2 IMPLANT
NEEDLE PERC 21GX4CM (NEEDLE) ×2 IMPLANT
PACK CARDIAC CATHETERIZATION (CUSTOM PROCEDURE TRAY) ×2 IMPLANT
SHEATH RAIN 4/5FR (SHEATH) ×2 IMPLANT
SHEATH RAIN RADIAL 21G 6FR (SHEATH) ×2 IMPLANT
TRANSDUCER W/STOPCOCK (MISCELLANEOUS) ×2 IMPLANT
TUBING CIL FLEX 10 FLL-RA (TUBING) ×2 IMPLANT

## 2017-10-04 NOTE — Progress Notes (Addendum)
Patient ID: [name], male   DOB: [birth year], 62 y.o.   MRN: [id number]     Advanced Heart Failure Rounding Note  PCP-Cardiologist: Minus Breeding, MD   Subjective:    Weight down another 4 lbs with IV lasix + metolazone yesterday. Creatinine up to 1.9.   10 beats NSVT yesterday.   Denies CP, SOB, orthopnea. Feels like he still has some fluid, but not much.   Objective:   Weight Range: 281 lb 12.8 oz (127.8 kg) Body mass index is 38.22 kg/m.   Vital Signs:   Temp:  [98.2 F (36.8 C)-98.5 F (36.9 C)] 98.5 F (36.9 C) (06/03 0520) Pulse Rate:  [66-72] 72 (06/03 0520) Resp:  [18-20] 18 (06/03 0520) BP: (94-127)/(59-86) 94/81 (06/03 0520) SpO2:  [91 %-100 %] 96 % (06/03 0520) Weight:  [281 lb 12.8 oz (127.8 kg)] 281 lb 12.8 oz (127.8 kg) (06/03 0520) Last BM Date: 10/03/17  Weight change: Filed Weights   10/02/17 0328 10/03/17 0600 10/04/17 0520  Weight: 289 lb 12.8 oz (131.5 kg) 285 lb 6.4 oz (129.5 kg) 281 lb 12.8 oz (127.8 kg)    Intake/Output:   Intake/Output Summary (Last 24 hours) at 10/04/2017 0825 Last data filed at 10/04/2017 0700 Gross per 24 hour  Intake 1507.67 ml  Output 3475 ml  Net -1967.33 ml      Physical Exam    General: Well appearing. No resp difficulty. HEENT: Normal Neck: Supple. JVP ~14. Carotids 2+ bilat; no bruits. No thyromegaly or nodule noted. Cor: PMI nonpalpable. RRR, No M/G/R noted Lungs: CTAB, normal effort. Abdomen: Soft, non-tender, non-distended, no HSM. No bruits or masses. +BS  Extremities: No cyanosis, clubbing, or rash. R and LLE trace edema  Neuro: Alert & orientedx3, cranial nerves grossly intact. moves all 4 extremities w/o difficulty. Affect pleasant  Telemetry   NSR 60s with 1st degree AV block. 1 run of NSVT - 10 beats yesterday. Personally reviewed.   Labs    CBC Recent Labs    10/03/17 0520 10/04/17 0427  WBC 5.6 6.5  NEUTROABS 4.1 4.9  HGB 12.7* 12.9*  HCT 42.0 42.9  MCV 95.5 95.1  PLT 212 034    Basic Metabolic Panel Recent Labs    10/03/17 1541 10/04/17 0427  NA 143 142  K 3.4* 3.6  CL 96* 98*  CO2 39* 39*  GLUCOSE 104* 114*  BUN 17 21*  CREATININE 1.86* 1.91*  CALCIUM 8.5* 8.3*   Liver Function Tests Recent Labs    10/01/17 1346  AST 33  ALT 60  ALKPHOS 57  BILITOT 2.0*  PROT 5.2*  ALBUMIN 2.9*   No results for input(s): LIPASE, AMYLASE in the last 72 hours. Cardiac Enzymes No results for input(s): CKTOTAL, CKMB, CKMBINDEX, TROPONINI in the last 72 hours.  BNP: BNP (last 3 results) Recent Labs    07/27/17 1114 10/01/17 1346  BNP 787.1* 1,883.8*    ProBNP (last 3 results) No results for input(s): PROBNP in the last 8760 hours.   D-Dimer No results for input(s): DDIMER in the last 72 hours. Hemoglobin A1C No results for input(s): HGBA1C in the last 72 hours. Fasting Lipid Panel Recent Labs    10/02/17 0720  CHOL 114  HDL 24*  LDLCALC 79  TRIG 55  CHOLHDL 4.8   Thyroid Function Tests Recent Labs    10/01/17 1346  TSH 0.860    Other results:   Imaging    No results found.   Medications:  Scheduled Medications: . allopurinol  300 mg Oral Daily  . amiodarone  200 mg Oral Daily  . [START ON 10/05/2017] aspirin EC  81 mg Oral Daily  . atorvastatin  80 mg Oral QHS  . carvedilol  25 mg Oral BID WC  . cholecalciferol  1,000 Units Oral Daily  . insulin aspart  0-9 Units Subcutaneous TID WC  . insulin glargine  10 Units Subcutaneous Q2200  . isosorbide-hydrALAZINE  0.5 tablet Oral TID  . potassium chloride SA  20 mEq Oral Daily  . sodium chloride flush  3 mL Intravenous Q12H  . sodium chloride flush  3 mL Intravenous Q12H    Infusions: . sodium chloride    . sodium chloride    . sodium chloride 10 mL/hr at 10/04/17 0626  . heparin 1,500 Units/hr (10/03/17 1746)    PRN Medications: sodium chloride, sodium chloride, acetaminophen, ondansetron (ZOFRAN) IV, sodium chloride flush, sodium chloride flush,  traMADol    Assessment/Plan   1. 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  Echo in 4/19 showed worsening of LV systolic function, EF down to 20-25%.  There was moderate LVH.  Cause of cardiomyopathy uncertain.  Patient has had difficult-to-control HTN.  Rare ETOH use.  Never worked up for amyloidosis. He has a Research officer, political party ICD, cannot get cardiac MRI.  Narrow QRS in past, not candidate for CRT upgrade.  - Volume status mildly elevated on exam.  - Hold off on lasix this am. Will give gentle IVF this am for elevated creatinine prior to cath.  - Continue home Coreg.  - Continue Bidil 0.5 tab tid.  - Holding off on ACEI/ARB/ARNI for now with need for diuresis and elevated creatinine.  - Hold off on spiro with creatinine 1.9.  - Myeloma panel pending. - Plan for PYP scan to work up amyloidosis (has moderate LVH, but this may be due to HTN). Consider after cath.  - Given fall in EF and HF decompensation, will plan R/LHC on Monday (if creatinine remains stable).  Will need to limit contrast. We discussed risks/benefits of procedure and he agrees.  Will hold Lasix this morning and give IVF prior to cath.  2. CKD: Stage 3. Creatinine 1.9 this am. Hold lasix for now. IVF prior to cath.  3. CAD: Distal LCx disease on 9/14 cath. With fall in EF and worsening symptoms, would like to make sure this has not worsened.   - Continue ASA 81 and statin.  - He is on the schedule for R/LHC at 2 pm. Creatinine up to 1.9. Will discuss with Dr Aundra Dubin 4. Rectal bleeding: Noted on toilet paper Saturday.  He is on heparin gtt while off warfarin. He says that he has a history of hemorrhoids.  Stable hgb at 12.9 5. Atrial fibrillation: Paroxysmal.  Given history of CVA, should get heparin bridge while off warfarin.  - Remains in NSR - Warfarin held, INR 1.36. He is on heparin gtt.  6. H/o VT: Has ICD. No change - 10  beats NSVT yesterday. - Add on mag. Supp K this am.   [facility] today.  Length of Stay: Davis, NP  10/04/2017, 8:25 AM  Advanced Heart Failure Team Pager [phone] (M-F; 7a - 4p)  Please contact [facility] for night-coverage after hours (4p -7a ) and weekends on [email or web address]  Patient seen with NP, agree with the above note.  He diuresed  well yesterday, weight is down again.  Volume looking better.  Creatinine 1.9 this am which is near his baseline.    Plan for LHC/RHC this morning.  Lasix held.  Getting gentle hydration.  Will determine further diuresis based on findings.   Will increase Bidil to 1 tab tid.   May resume heparin/warfarin post-cath.   Will need PYP scan.   [name] 10/04/2017 11:35 AM

## 2017-10-04 NOTE — Progress Notes (Addendum)
ANTICOAGULATION CONSULT NOTE  Pharmacy Consult for Heparin Indication: atrial fibrillation  Patient Measurements: Height: 6' (182.9 cm) Weight: 281 lb 12.8 oz (127.8 kg)(scale a) IBW/kg (Calculated) : 77.6 Heparin Dosing Weight: 107.4 kg  Labs: Recent Labs    10/02/17 0720 10/02/17 1226 10/02/17 2118 10/03/17 0520 10/03/17 1541 10/04/17 0427  HGB  --  13.2  --  12.7*  --  12.9*  HCT  --  44.0  --  42.0  --  42.9  PLT  --  223  --  212  --  219  LABPROT 20.9*  --   --  19.2*  --  16.7*  INR 1.82  --   --  1.63  --  1.36  HEPARINUNFRC  --   --  0.31 0.46  --  0.37  CREATININE 1.75*  --   --  1.59* 1.86* 1.91*   Estimated Creatinine Clearance: 56.1 mL/min (A) (by C-G formula based on SCr of 1.91 mg/dL (H)).  Assessment: 62 yo M on Coumadin PTA for PAF.  Admitted with HF exacerbation. Heparin started in anticipation of cath after INR <2 on 6/1. Shortly after heparin was restarted, pt had a bloody bowel movement (light) and infusion was held for two hours. No further episodes have occurred after speaking with his nurse.  Heparin level is therapeutic at 0.37 today, on 1500 units/hr. CBC stable, and no further reports of bleeding. Continue heparin at current infusion rate.  Goal of Therapy:  Heparin level 0.3-0.7 units/ml Monitor platelets by anticoagulation protocol: Yes   Plan:  Continue heparin gtt at 1500 units/hr Monitor daily heparin level and CBC, s/sx bleeding Cath planned for 6/3 - follow up after procedure  Doylene Canard, PharmD Clinical Pharmacist  Pager: [phone] Phone: [phone] 10/04/2017 7:18 AM   ADDENDUM Underwent L/RHC finding nonischemic cardiomyopathy with low cardiac output. INR is 1.36 today - last dose was on 5/30. Home regimen is 5 mg daily except 10 mg on Monday. Okay to resume heparin 8 hours after sheath removal - documented removal time was 1208. Will resume heparin at previously therapeutic rate of 1500 units/hr starting on 6/3 at 2000. Will order  warfarin 10 mg starting tonight.   Doylene Canard, PharmD Clinical Pharmacist

## 2017-10-04 NOTE — Plan of Care (Signed)
Patient remained free of respiratory complications during the shift. He remained on 2 liters of oxygen and verbalized feeling comfort. He denied being SOB.

## 2017-10-04 NOTE — Progress Notes (Signed)
Santiago Glad fro cath lab called to state pt is coming ot cath lab, advised pt just had meds with sips, pt alert and oriented, denies cp or sob, pt to cath lab in bed in stable condtion

## 2017-10-05 ENCOUNTER — Inpatient Hospital Stay (HOSPITAL_COMMUNITY): Payer: Medicare Other

## 2017-10-05 DIAGNOSIS — I272 Pulmonary hypertension, unspecified: Secondary | ICD-10-CM

## 2017-10-05 LAB — CBC WITH DIFFERENTIAL/PLATELET
ABS IMMATURE GRANULOCYTES: 0 10*3/uL (ref 0.0–0.1)
BASOS PCT: 1 %
Basophils Absolute: 0 10*3/uL (ref 0.0–0.1)
Eosinophils Absolute: 0.1 10*3/uL (ref 0.0–0.7)
Eosinophils Relative: 2 %
HCT: 41.7 % (ref 39.0–52.0)
Hemoglobin: 12.5 g/dL — ABNORMAL LOW (ref 13.0–17.0)
Immature Granulocytes: 0 %
Lymphocytes Relative: 13 %
Lymphs Abs: 0.8 10*3/uL (ref 0.7–4.0)
MCH: 28.7 pg (ref 26.0–34.0)
MCHC: 30 g/dL (ref 30.0–36.0)
MCV: 95.9 fL (ref 78.0–100.0)
MONO ABS: 0.6 10*3/uL (ref 0.1–1.0)
MONOS PCT: 11 %
NEUTROS ABS: 4.4 10*3/uL (ref 1.7–7.7)
Neutrophils Relative %: 73 %
PLATELETS: 194 10*3/uL (ref 150–400)
RBC: 4.35 MIL/uL (ref 4.22–5.81)
RDW: 16.6 % — ABNORMAL HIGH (ref 11.5–15.5)
WBC: 6 10*3/uL (ref 4.0–10.5)

## 2017-10-05 LAB — BASIC METABOLIC PANEL
Anion gap: 6 (ref 5–15)
BUN: 22 mg/dL — AB (ref 6–20)
CHLORIDE: 97 mmol/L — AB (ref 101–111)
CO2: 40 mmol/L — AB (ref 22–32)
Calcium: 8.5 mg/dL — ABNORMAL LOW (ref 8.9–10.3)
Creatinine, Ser: 1.85 mg/dL — ABNORMAL HIGH (ref 0.61–1.24)
GFR calc Af Amer: 44 mL/min — ABNORMAL LOW (ref >60)
GFR, EST NON AFRICAN AMERICAN: 38 mL/min — AB (ref >60)
GLUCOSE: 96 mg/dL (ref 65–99)
POTASSIUM: 3.4 mmol/L — AB (ref 3.5–5.1)
Sodium: 143 mmol/L (ref 135–145)

## 2017-10-05 LAB — MULTIPLE MYELOMA PANEL, SERUM
ALBUMIN/GLOB SERPL: 1.2 (ref 0.7–1.7)
ALPHA 1: 0.3 g/dL (ref 0.0–0.4)
Albumin SerPl Elph-Mcnc: 3 g/dL (ref 2.9–4.4)
Alpha2 Glob SerPl Elph-Mcnc: 0.6 g/dL (ref 0.4–1.0)
B-Globulin SerPl Elph-Mcnc: 0.9 g/dL (ref 0.7–1.3)
Gamma Glob SerPl Elph-Mcnc: 0.8 g/dL (ref 0.4–1.8)
Globulin, Total: 2.6 g/dL (ref 2.2–3.9)
IGA: 113 mg/dL (ref 61–437)
IGM (IMMUNOGLOBULIN M), SRM: 47 mg/dL (ref 20–172)
IgG (Immunoglobin G), Serum: 925 mg/dL (ref 700–1600)
Total Protein ELP: 5.6 g/dL — ABNORMAL LOW (ref 6.0–8.5)

## 2017-10-05 LAB — PROTIME-INR
INR: 1.22
Prothrombin Time: 15.3 seconds — ABNORMAL HIGH (ref 11.4–15.2)

## 2017-10-05 LAB — GLUCOSE, CAPILLARY
GLUCOSE-CAPILLARY: 85 mg/dL (ref 65–99)
GLUCOSE-CAPILLARY: 104 mg/dL — AB (ref 65–99)
GLUCOSE-CAPILLARY: 88 mg/dL (ref 65–99)
GLUCOSE-CAPILLARY: 137 mg/dL — AB (ref 65–99)

## 2017-10-05 LAB — HEPARIN LEVEL (UNFRACTIONATED)
Heparin Unfractionated: 0.26 IU/mL — ABNORMAL LOW (ref 0.30–0.70)
Heparin Unfractionated: 0.32 IU/mL (ref 0.30–0.70)

## 2017-10-05 MED ORDER — DIGOXIN 125 MCG PO TABS
0.1250 mg | ORAL_TABLET | Freq: Every day | ORAL | Status: DC
  Administered 2017-10-05 – 2017-10-07 (×3): 0.125 mg via ORAL
  Filled 2017-10-05 (×3): qty 1

## 2017-10-05 MED ORDER — CARVEDILOL 12.5 MG PO TABS
12.5000 mg | ORAL_TABLET | Freq: Two times a day (BID) | ORAL | Status: DC
  Administered 2017-10-05: 12.5 mg via ORAL
  Filled 2017-10-05: qty 1

## 2017-10-05 MED ORDER — METOLAZONE 2.5 MG PO TABS
2.5000 mg | ORAL_TABLET | Freq: Once | ORAL | Status: AC
  Administered 2017-10-05: 2.5 mg via ORAL
  Filled 2017-10-05: qty 1

## 2017-10-05 MED ORDER — SPIRONOLACTONE 12.5 MG HALF TABLET
12.5000 mg | ORAL_TABLET | Freq: Every day | ORAL | Status: DC
  Administered 2017-10-05 – 2017-10-07 (×3): 12.5 mg via ORAL
  Filled 2017-10-05 (×3): qty 1

## 2017-10-05 MED ORDER — TECHNETIUM TC 99M PYROPHOSPHATE
21.6000 | Freq: Once | INTRAVENOUS | Status: AC
  Administered 2017-10-05: 21.6 via INTRAVENOUS

## 2017-10-05 MED ORDER — POTASSIUM CHLORIDE CRYS ER 20 MEQ PO TBCR
20.0000 meq | EXTENDED_RELEASE_TABLET | Freq: Once | ORAL | Status: AC
Start: 2017-10-05 — End: 2017-10-05
  Administered 2017-10-05: 20 meq via ORAL
  Filled 2017-10-05: qty 1

## 2017-10-05 MED ORDER — POTASSIUM CHLORIDE CRYS ER 20 MEQ PO TBCR
40.0000 meq | EXTENDED_RELEASE_TABLET | Freq: Two times a day (BID) | ORAL | Status: AC
  Administered 2017-10-05 (×2): 40 meq via ORAL
  Filled 2017-10-05 (×2): qty 2

## 2017-10-05 MED ORDER — WARFARIN SODIUM 7.5 MG PO TABS
7.5000 mg | ORAL_TABLET | Freq: Once | ORAL | Status: AC
  Administered 2017-10-05: 7.5 mg via ORAL
  Filled 2017-10-05: qty 1

## 2017-10-05 MED ORDER — MAGNESIUM SULFATE 2 GM/50ML IV SOLN
2.0000 g | Freq: Once | INTRAVENOUS | Status: AC
  Administered 2017-10-05: 2 g via INTRAVENOUS
  Filled 2017-10-05: qty 50

## 2017-10-05 NOTE — Care Management Note (Signed)
Case Management Note  Patient Details  Name: [name] MRN: [id number] Date of Birth: 1955-10-31  Subjective/Objective:  CHF                 Action/Plan: Primary Physician: Biagio Borg, MD; has private insurance with [facility] with prescription drug coverage. CM will continue to follow for progression of care.  Expected Discharge Date:      Possibly 10/08/2017            Expected Discharge Plan:  Home/Self Care  In-House Referral:   [facility]  Discharge planning Services  CM Consult  Status of Service:  In process, will continue to follow  Sherrilyn Rist [phone] 10/05/2017, 1:52 PM

## 2017-10-05 NOTE — Care Management Important Message (Signed)
Important Message  Patient Details  Name: [name] MRN: [id number] Date of Birth: [birth year]   Medicare Important Message Given:  Yes    Brinn Westby P Kalijah Westfall 10/05/2017, 3:06 PM

## 2017-10-05 NOTE — Progress Notes (Signed)
Pt's BP= 86/75. Pt is asymptomatic. MD notified. No new orders.

## 2017-10-05 NOTE — Progress Notes (Signed)
Offered to walk with pt, pt states he will walk later with his RN. CHF booklet given to pt. Pt demonstrates understanding of the disease, and  Symptoms to look out for. Pt states understanding importance of daily weights, watching his fluid intake and monitoring sodium intake. Low sodium diets given to pt. CRP II offered to pt, and will refer to [place]. Pt eager to start.   [id number] Rufina Falco, RN BSN 10/05/2017 2:58 PM

## 2017-10-05 NOTE — Progress Notes (Addendum)
[place] for Heparin/warfarin Indication: atrial fibrillation  Patient Measurements: Height: 6' (182.9 cm) Weight: 279 lb 4.8 oz (126.7 kg)(scale a) IBW/kg (Calculated) : 77.6 Heparin Dosing Weight: 107.4 kg  Labs: Recent Labs    10/03/17 0520 10/03/17 1541 10/04/17 0427 10/05/17 0526  HGB 12.7*  --  12.9* 12.5*  HCT 42.0  --  42.9 41.7  PLT 212  --  219 194  LABPROT 19.2*  --  16.7* 15.3*  INR 1.63  --  1.36 1.22  HEPARINUNFRC 0.46  --  0.37 0.26*  CREATININE 1.59* 1.86* 1.91* 1.85*   Estimated Creatinine Clearance: 57.6 mL/min (A) (by C-G formula based on SCr of 1.85 mg/dL (H)).  Assessment: 62 yo M on Coumadin PTA for PAF.  Admitted with HF exacerbation. Heparin started in anticipation of cath after INR <2 on 6/1. Shortly after heparin was restarted, pt had a bloody bowel movement (light) and infusion was held for two hours. No further episodes have occurred after speaking with his nurse.  Heparin level is slightly subtherapeutic at 0.26 today, on 1500 units/hr after being restarted last night 8 hours after sheath removal. INR is trending downward to 1.22 today- warfarin resumed at 10 mg last night. Hb 12.5, plt 194. No further reports of bleeding - old drainage at IV site.   Home regimen is 5 mg daily except 10 mg on Monday.  Goal of Therapy:  Heparin level 0.3-0.7 units/ml Monitor platelets by anticoagulation protocol: Yes   Plan:  Increase heparin gtt to 1600 units/hr Obtain heparin level in 6 hours Order warfarin 7.5 mg tonight Monitor daily heparin level, INR, CBC, and s/sx bleeding  Doylene Canard, PharmD Clinical Pharmacist  Pager: [phone] Phone: [phone] 10/05/2017 7:36 AM   ADDENDUM Heparin level came back therapeutic at 0.32 on 1600 units/hr. Will continue at current rate. Will monitor heparin level with morning labs.   Doylene Canard, PharmD Clinical Pharmacist  Pager: [phone] Phone: [phone]

## 2017-10-05 NOTE — Progress Notes (Signed)
Pt already wearing CPAP when RT checked. RT will continue to monitor

## 2017-10-05 NOTE — Progress Notes (Signed)
MD states to hold next dose of bidil and they are decreasing the dosage of coreg.

## 2017-10-05 NOTE — Progress Notes (Signed)
Patient ID: [name], male   DOB: 02-07-1956, 62 y.o.   MRN: [id number]     Advanced Heart Failure Rounding Note  PCP-Cardiologist: Minus Breeding, MD   Subjective:    Weight down another 2 lbs. I/O -480 mls. Creatinine 1.85. 80 mg IV lasix BID restarted after cath.  Overall breathing better.   LHC 10/04/17: Left Main  No significant disease.  Left Anterior Descending  Large, wrap-around LAD with 30% mid vessel stenosis.  Left Circumflex  50-60% distal LCx stenosis.  Right Coronary Artery  50% mid RCA stenosis.   [place] 10/04/17: RA mean 19 RV 72/23 PA 74/37, mean 51 PCWP mean 19 LV 111/28 AO 112/80 Oxygen saturations: PA 55% AO 95% Cardiac Output (Fick) 4.68  Cardiac Index (Fick) 1.9  PVR 6.8 WU CVP/PCWP 1 PAPi 1.95  1. Nonischemic cardiomyopathy.  Coronary disease well out of proportion to degree of cardiomyopathy.  2. Elevated R>L heart filling pressures, suggestive of significant RV failure with high CVP/PCWP ratio and PAPi < 2.  3. Low cardiac output.  4. Mixed pulmonary venous/pulmonary arterial hypertension, ?due to hypoxemia from OSA or OHS/OSA.    Objective:   Weight Range: 279 lb 4.8 oz (126.7 kg) Body mass index is 37.88 kg/m.   Vital Signs:   Temp:  [97.8 F (36.6 C)-97.9 F (36.6 C)] 97.9 F (36.6 C) (06/04 0500) Pulse Rate:  [60-96] 88 (06/04 0500) Resp:  [16-25] 18 (06/04 0500) BP: (89-127)/(50-95) 113/77 (06/04 0500) SpO2:  [85 %-98 %] 98 % (06/04 0500) Weight:  [279 lb 4.8 oz (126.7 kg)] 279 lb 4.8 oz (126.7 kg) (06/04 0500) Last BM Date: 10/03/17  Weight change: Filed Weights   10/03/17 0600 10/04/17 0520 10/05/17 0500  Weight: 285 lb 6.4 oz (129.5 kg) 281 lb 12.8 oz (127.8 kg) 279 lb 4.8 oz (126.7 kg)    Intake/Output:   Intake/Output Summary (Last 24 hours) at 10/05/2017 0807 Last data filed at 10/05/2017 0600 Gross per 24 hour  Intake 969 ml  Output 1450 ml  Net -481 ml      Physical Exam    General: Well appearing. No  resp difficulty. HEENT: Normal Neck: Supple. JVP 12+. Carotids 2+ bilat; no bruits. No thyromegaly or nodule noted. Cor: PMI nonpalpable. RRR, No M/G/R noted Lungs: CTAB, normal effort. Abdomen: Soft, non-tender, non-distended, no HSM. No bruits or masses. +BS  Extremities: No cyanosis, clubbing, or rash. 1+ ankle edema Neuro: Alert & orientedx3, cranial nerves grossly intact. moves all 4 extremities w/o difficulty. Affect pleasant  Telemetry   NSR 60s with 1st degree AV block. Personally reviewed.   Labs    CBC Recent Labs    10/04/17 0427 10/05/17 0526  WBC 6.5 6.0  NEUTROABS 4.9 4.4  HGB 12.9* 12.5*  HCT 42.9 41.7  MCV 95.1 95.9  PLT 219 644   Basic Metabolic Panel Recent Labs    10/04/17 0427 10/05/17 0526  NA 142 143  K 3.6 3.4*  CL 98* 97*  CO2 39* 40*  GLUCOSE 114* 96  BUN 21* 22*  CREATININE 1.91* 1.85*  CALCIUM 8.3* 8.5*  MG 1.8  --    Liver Function Tests No results for input(s): AST, ALT, ALKPHOS, BILITOT, PROT, ALBUMIN in the last 72 hours. No results for input(s): LIPASE, AMYLASE in the last 72 hours. Cardiac Enzymes No results for input(s): CKTOTAL, CKMB, CKMBINDEX, TROPONINI in the last 72 hours.  BNP: BNP (last 3 results) Recent Labs    07/27/17 1114 10/01/17 1346  BNP 787.1* 1,883.8*    ProBNP (last 3 results) No results for input(s): PROBNP in the last 8760 hours.   D-Dimer No results for input(s): DDIMER in the last 72 hours. Hemoglobin A1C No results for input(s): HGBA1C in the last 72 hours. Fasting Lipid Panel No results for input(s): CHOL, HDL, LDLCALC, TRIG, CHOLHDL, LDLDIRECT in the last 72 hours. Thyroid Function Tests No results for input(s): TSH, T4TOTAL, T3FREE, THYROIDAB in the last 72 hours.  Invalid input(s): FREET3  Other results:   Imaging    No results found.   Medications:     Scheduled Medications: . allopurinol  300 mg Oral Daily  . amiodarone  200 mg Oral Daily  . aspirin EC  81 mg Oral  Daily  . atorvastatin  80 mg Oral QHS  . carvedilol  25 mg Oral BID WC  . cholecalciferol  1,000 Units Oral Daily  . furosemide  80 mg Intravenous BID  . insulin aspart  0-9 Units Subcutaneous TID WC  . insulin glargine  10 Units Subcutaneous Q2200  . isosorbide-hydrALAZINE  1 tablet Oral TID  . potassium chloride SA  20 mEq Oral Daily  . sodium chloride flush  3 mL Intravenous Q12H  . warfarin  7.5 mg Oral ONCE-1800  . Warfarin - Pharmacist Dosing Inpatient   Does not apply q1800    Infusions: . sodium chloride    . heparin 1,500 Units/hr (10/04/17 2041)    PRN Medications: sodium chloride, acetaminophen, ondansetron (ZOFRAN) IV, sodium chloride flush, traMADol    Assessment/Plan   1. Acute on chronic systolic CHF: Nonischemic cardiomyopathy.  Echo in 4/19 showed worsening of LV systolic function, EF down to 20-25%.  There was moderate LVH.  Cause of cardiomyopathy uncertain.  Patient has had difficult-to-control HTN.  Rare ETOH use.  Never worked up for amyloidosis. He has a Research officer, political party ICD, cannot get cardiac MRI.  Narrow QRS in past, not candidate for CRT upgrade. LHC 10/04/17 with mild CAD, cardiomyopathy out of proportion to CAD.  [place] 10/04/17: Elevated R>L heart filling pressures, suggestive of significant RV failure with high CVP/PCWP ratio and PAPi < 2, CI was 1.9.  RV failure may be related to OHS/OSA with severe mixed pulmonary venous/pulmonary arterial HTN by RHC.  He remains volume overloaded on exam. Creatinine stable.  - Continue 80 mg IV lasix BID with a dose of metolazone 2.5 x 1.  - Continue home Coreg 25 mg BID - Continue Bidil 1 tab tid.  - Add digoxin 0.125 with low cardiac output and stable creatinine.  - Add spironolactone 12.5 daily.  - Holding off on ACEI/ARB/ARNI for now with need for diuresis and elevated creatinine.  - Workup for cardiac amyloidosis: myeloma panel still pending, ordered PYP scan (cannot get MRI). - Pulmonary hypertension/RV failure would make  LVAD a difficult proposition.  2. CKD: Stage 3. Creatinine 1.85 this am, stable.  Had minimal contrast (30 cc) with cath on 6/3.  3. CAD:  LHC 10/04/17: LAD 30% mid vessel stenosis, 50-60% distal LCx stenosis, 50% mid RCA stenosis. CM out of proportion to CAD -> NICM - Continue ASA 81 and statin.  4. Rectal bleeding: Noted on toilet paper Saturday.  He says that he has a history of hemorrhoids.  Resolved.  5. Atrial fibrillation: Paroxysmal.  Given history of CVA, should get heparin bridge while off warfarin. Remains in NSR - Heparin bridge to therapeutic INR (back on warfarin).  6. H/o VT: Has ICD. No change 7. 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  May be due in part to OSA/OHS.  - He is on oxygen during the day and CPAP at night.  Should have pulmonary followup as outpatient.  - Needs V/Q scan to rule out chronic PE (though unlikely as on warfarin at home).   Length of Stay: 3  [name] 10/05/2017

## 2017-10-05 NOTE — Progress Notes (Signed)
Pt returned from nuc med. Pt states he does not want to ambulate in hall at this time. RN informed pt she will come back to ambulate at 1500. Pt states this is a good time.

## 2017-10-05 NOTE — Progress Notes (Signed)
RN ambulated with pt in hall to nurse's station and back to pt's room. Tolerated well.

## 2017-10-05 NOTE — Progress Notes (Signed)
RN spoke with nuc med. Nuc med states pt does not need to be NPO. Pt breakfast arrived in room.

## 2017-10-06 LAB — CBC WITH DIFFERENTIAL/PLATELET
ABS IMMATURE GRANULOCYTES: 0 10*3/uL (ref 0.0–0.1)
Basophils Absolute: 0 10*3/uL (ref 0.0–0.1)
Basophils Relative: 0 %
Eosinophils Absolute: 0.1 10*3/uL (ref 0.0–0.7)
Eosinophils Relative: 2 %
HCT: 45.8 % (ref 39.0–52.0)
HEMOGLOBIN: 13.7 g/dL (ref 13.0–17.0)
IMMATURE GRANULOCYTES: 1 %
LYMPHS ABS: 0.8 10*3/uL (ref 0.7–4.0)
LYMPHS PCT: 13 %
MCH: 28.1 pg (ref 26.0–34.0)
MCHC: 29.9 g/dL — ABNORMAL LOW (ref 30.0–36.0)
MCV: 93.9 fL (ref 78.0–100.0)
Monocytes Absolute: 0.6 10*3/uL (ref 0.1–1.0)
Monocytes Relative: 10 %
NEUTROS PCT: 74 %
Neutro Abs: 4.7 10*3/uL (ref 1.7–7.7)
Platelets: 227 10*3/uL (ref 150–400)
RBC: 4.88 MIL/uL (ref 4.22–5.81)
RDW: 16.5 % — ABNORMAL HIGH (ref 11.5–15.5)
WBC: 6.3 10*3/uL (ref 4.0–10.5)

## 2017-10-06 LAB — BASIC METABOLIC PANEL
ANION GAP: 10 (ref 5–15)
BUN: 25 mg/dL — ABNORMAL HIGH (ref 6–20)
CALCIUM: 9 mg/dL (ref 8.9–10.3)
CHLORIDE: 98 mmol/L — AB (ref 101–111)
CO2: 34 mmol/L — AB (ref 22–32)
Creatinine, Ser: 1.75 mg/dL — ABNORMAL HIGH (ref 0.61–1.24)
GFR calc non Af Amer: 40 mL/min — ABNORMAL LOW (ref >60)
GFR, EST AFRICAN AMERICAN: 47 mL/min — AB (ref >60)
Glucose, Bld: 84 mg/dL (ref 65–99)
Potassium: 4.1 mmol/L (ref 3.5–5.1)
Sodium: 142 mmol/L (ref 135–145)

## 2017-10-06 LAB — PROTIME-INR
INR: 1.26
Prothrombin Time: 15.7 seconds — ABNORMAL HIGH (ref 11.4–15.2)

## 2017-10-06 LAB — GLUCOSE, CAPILLARY
GLUCOSE-CAPILLARY: 90 mg/dL (ref 65–99)
GLUCOSE-CAPILLARY: 128 mg/dL — AB (ref 65–99)
Glucose-Capillary: 108 mg/dL — ABNORMAL HIGH (ref 65–99)
Glucose-Capillary: 105 mg/dL — ABNORMAL HIGH (ref 65–99)

## 2017-10-06 LAB — HEPARIN LEVEL (UNFRACTIONATED): Heparin Unfractionated: 0.42 IU/mL (ref 0.30–0.70)

## 2017-10-06 LAB — MAGNESIUM: Magnesium: 2.1 mg/dL (ref 1.7–2.4)

## 2017-10-06 MED ORDER — INSULIN ASPART 100 UNIT/ML ~~LOC~~ SOLN
0.0000 [IU] | Freq: Three times a day (TID) | SUBCUTANEOUS | Status: DC
  Administered 2017-10-06: 1 [IU] via SUBCUTANEOUS
  Administered 2017-10-07: 2 [IU] via SUBCUTANEOUS

## 2017-10-06 MED ORDER — METOLAZONE 2.5 MG PO TABS
2.5000 mg | ORAL_TABLET | Freq: Once | ORAL | Status: AC
Start: 2017-10-06 — End: 2017-10-06
  Administered 2017-10-06: 2.5 mg via ORAL
  Filled 2017-10-06: qty 1

## 2017-10-06 MED ORDER — CARVEDILOL 12.5 MG PO TABS
12.5000 mg | ORAL_TABLET | Freq: Two times a day (BID) | ORAL | Status: DC
  Administered 2017-10-06 – 2017-10-07 (×2): 12.5 mg via ORAL
  Filled 2017-10-06 (×2): qty 1

## 2017-10-06 MED ORDER — CARVEDILOL 6.25 MG PO TABS: 6.2500 mg | ORAL_TABLET | Freq: Two times a day (BID) | ORAL | Status: DC

## 2017-10-06 MED ORDER — MAGNESIUM HYDROXIDE 400 MG/5ML PO SUSP
30.0000 mL | Freq: Every day | ORAL | Status: DC | PRN
  Administered 2017-10-06: 30 mL via ORAL
  Filled 2017-10-06: qty 30

## 2017-10-06 MED ORDER — CARVEDILOL 12.5 MG PO TABS
12.5000 mg | ORAL_TABLET | Freq: Two times a day (BID) | ORAL | Status: DC
  Administered 2017-10-06: 12.5 mg via ORAL
  Filled 2017-10-06: qty 1

## 2017-10-06 MED ORDER — WARFARIN SODIUM 7.5 MG PO TABS
7.5000 mg | ORAL_TABLET | Freq: Once | ORAL | Status: AC
  Administered 2017-10-06: 7.5 mg via ORAL
  Filled 2017-10-06: qty 1

## 2017-10-06 MED FILL — Heparin Sod (Porcine)-NaCl IV Soln 1000 Unit/500ML-0.9%: INTRAVENOUS | Qty: 1000 | Status: AC

## 2017-10-06 NOTE — Consult Note (Signed)
   [facility] CM Inpatient Consult   10/06/2017  [name] Jan 27, 1956 [id number]  Patient evaluated for community based chronic disease management services with [place] [facility] as a benefit of patient's [employer]. Spoke with patient at bedside to explain [place] Management services. Patient states he has been weighing on his tele-monitoring scale and it has been varying 6 to 10 lbs even if he steps back on it.  He spoke with the tele-monitoring nurse but ended in the hospital.  He states, "I thought I could handle this fluid issue my self but I realize I need some help."  Patient will receive post hospital discharge calls and will be  Assessed for further resources and disease process education.  Left contact information and [facility] literature at bedside. Made Inpatient Case Manager aware that [place] [facility] following and she has assigned the patient to [facility]. Of note, [facility] Care Management services does not replace or interfere with any services that are arranged by inpatient case management or social work.  For additional questions or referrals please contact:    Natividad Brood, RN BSN [place] [facility] Liaison  [phone] business mobile phone Toll free office [phone]

## 2017-10-06 NOTE — Progress Notes (Addendum)
Patient ID: [name], male   DOB: 1956-01-01, 62 y.o.   MRN: [id number]     Advanced Heart Failure Rounding Note  PCP-Cardiologist: Minus Breeding, MD   Subjective:    Weight down another 5 lbs with 80 mg IV lasix BID + 2.5 metolazone. I/O -1.7 L. Creatinine stable 1.75.   Had asymptomatic low BP yesterday afternoon (86/75). Coreg dose decreased and one dose of bidil held. SBP 90-100s now.   Back on coumadin with heparin bridge. INR 1.2  Denies CP, SOB, dizziness. Walked in hallways yesterday with no problems.   PYP 10/05/17: Visual and quantitative assessment (grade 0/1, H/CLL equal 1.3) are equivocal for transthyretin amyloidosis.  LHC 10/04/17: Left Main  No significant disease.  Left Anterior Descending  Large, wrap-around LAD with 30% mid vessel stenosis.  Left Circumflex  50-60% distal LCx stenosis.  Right Coronary Artery  50% mid RCA stenosis.   [place] 10/04/17: RA mean 19 RV 72/23 PA 74/37, mean 51 PCWP mean 19 LV 111/28 AO 112/80 Oxygen saturations: PA 55% AO 95% Cardiac Output (Fick) 4.68  Cardiac Index (Fick) 1.9  PVR 6.8 WU CVP/PCWP 1 PAPi 1.95  1. Nonischemic cardiomyopathy.  Coronary disease well out of proportion to degree of cardiomyopathy.  2. Elevated R>L heart filling pressures, suggestive of significant RV failure with high CVP/PCWP ratio and PAPi < 2.  3. Low cardiac output.  4. Mixed pulmonary venous/pulmonary arterial hypertension, ?due to hypoxemia from OSA or OHS/OSA.    Objective:   Weight Range: 274 lb (124.3 kg) Body mass index is 37.16 kg/m.   Vital Signs:   Temp:  [97.8 F (36.6 C)-98.4 F (36.9 C)] 98.4 F (36.9 C) (06/05 0538) Pulse Rate:  [66-70] 70 (06/05 0538) Resp:  [16-18] 18 (06/05 0538) BP: (86-108)/(64-77) 98/64 (06/05 0538) SpO2:  [91 %-97 %] 92 % (06/05 0538) Weight:  [274 lb (124.3 kg)] 274 lb (124.3 kg) (06/05 0538) Last BM Date: 10/03/17  Weight change: Filed Weights   10/04/17 0520 10/05/17 0500 10/06/17  0538  Weight: 281 lb 12.8 oz (127.8 kg) 279 lb 4.8 oz (126.7 kg) 274 lb (124.3 kg)    Intake/Output:   Intake/Output Summary (Last 24 hours) at 10/06/2017 0833 Last data filed at 10/06/2017 0700 Gross per 24 hour  Intake 1357.87 ml  Output 2975 ml  Net -1617.13 ml      Physical Exam    General:  No resp difficulty. HEENT: Normal Neck: Supple. JVP 10. Carotids 2+ bilat; no bruits. No thyromegaly or nodule noted. Cor: PMI nonpalpable. RRR, No M/G/R noted Lungs: CTAB, normal effort. Abdomen: obese, soft, non-tender, non-distended, no HSM. No bruits or masses. +BS  Extremities: No cyanosis, clubbing, or rash. R and LLE trace ankle edema Neuro: Alert & orientedx3, cranial nerves grossly intact. moves all 4 extremities w/o difficulty. Affect pleasant  Telemetry   NSR 60s with 1st degree AV block and occ. PVCs. Personally reviewed.   Labs    CBC Recent Labs    10/05/17 0526 10/06/17 0458  WBC 6.0 6.3  NEUTROABS 4.4 4.7  HGB 12.5* 13.7  HCT 41.7 45.8  MCV 95.9 93.9  PLT 194 970   Basic Metabolic Panel Recent Labs    10/04/17 0427 10/05/17 0526 10/06/17 0458  NA 142 143 142  K 3.6 3.4* 4.1  CL 98* 97* 98*  CO2 39* 40* 34*  GLUCOSE 114* 96 84  BUN 21* 22* 25*  CREATININE 1.91* 1.85* 1.75*  CALCIUM 8.3* 8.5* 9.0  MG  1.8  --  2.1   Liver Function Tests No results for input(s): AST, ALT, ALKPHOS, BILITOT, PROT, ALBUMIN in the last 72 hours. No results for input(s): LIPASE, AMYLASE in the last 72 hours. Cardiac Enzymes No results for input(s): CKTOTAL, CKMB, CKMBINDEX, TROPONINI in the last 72 hours.  BNP: BNP (last 3 results) Recent Labs    07/27/17 1114 10/01/17 1346  BNP 787.1* 1,883.8*    ProBNP (last 3 results) No results for input(s): PROBNP in the last 8760 hours.   D-Dimer No results for input(s): DDIMER in the last 72 hours. Hemoglobin A1C No results for input(s): HGBA1C in the last 72 hours. Fasting Lipid Panel No results for input(s):  CHOL, HDL, LDLCALC, TRIG, CHOLHDL, LDLDIRECT in the last 72 hours. Thyroid Function Tests No results for input(s): TSH, T4TOTAL, T3FREE, THYROIDAB in the last 72 hours.  Invalid input(s): FREET3  Other results:   Imaging    Nm Tumor Localization W Spect  Result Date: 10/05/2017 CLINICAL DATA:  HEART FAILURE. CONCERN FOR CARDIAC AMYLOIDOSIS. EXAM: NUCLEAR MEDICINE TUMOR LOCALIZATION. PYP CARDIAC AMYLOIDOSIS SCAN WITH SPECT TECHNIQUE: Following intravenous administration of radiopharmaceutical, anterior planar images of the chest were obtained. Regions of interest were placed on the heart and contralateral chest wall for quantitative assessment. Additional SPECT imaging of the chest was obtained. RADIOPHARMACEUTICALS:  21.6 mCi TECHNETIUM 99 PYROPHOSPHATE FINDINGS: Planar Visual assessment: Anterior planar imaging demonstrates radiotracer uptake within the heart less than uptake within the adjacent ribs (Grade 0/1). Quantitative assessment : Quantitative assessment of the cardiac uptake compared to the contralateral chest wall is equal to (H/CL = 1.3). SPECT assessment: SPECT imaging of the chest demonstrates very little radiotracer accumulation within the LEFT ventricle. IMPRESSION: Visual and quantitative assessment (grade 0/1, H/CLL equal 1.3) are equivocal for transthyretin amyloidosis. Electronically Signed   By: Kerby Moors M.D.   On: 10/05/2017 13:56     Medications:     Scheduled Medications: . allopurinol  300 mg Oral Daily  . amiodarone  200 mg Oral Daily  . aspirin EC  81 mg Oral Daily  . atorvastatin  80 mg Oral QHS  . carvedilol  12.5 mg Oral BID WC  . cholecalciferol  1,000 Units Oral Daily  . digoxin  0.125 mg Oral Daily  . furosemide  80 mg Intravenous BID  . insulin aspart  0-9 Units Subcutaneous TID WC  . insulin glargine  10 Units Subcutaneous Q2200  . isosorbide-hydrALAZINE  1 tablet Oral TID  . potassium chloride SA  20 mEq Oral Daily  . sodium chloride flush  3  mL Intravenous Q12H  . spironolactone  12.5 mg Oral Daily  . Warfarin - Pharmacist Dosing Inpatient   Does not apply q1800    Infusions: . sodium chloride    . heparin 1,600 Units/hr (10/05/17 1200)    PRN Medications: sodium chloride, acetaminophen, ondansetron (ZOFRAN) IV, sodium chloride flush, traMADol    Assessment/Plan   1. Acute on chronic systolic CHF: Nonischemic cardiomyopathy.  Echo in 4/19 showed worsening of LV systolic function, EF down to 20-25%.  There was moderate LVH.  Cause of cardiomyopathy uncertain.  Patient has had difficult-to-control HTN.  Rare ETOH use.  Never worked up for amyloidosis. He has a Research officer, political party ICD, cannot get cardiac MRI.  Narrow QRS in past, not candidate for CRT upgrade. LHC 10/04/17 with mild CAD, cardiomyopathy out of proportion to CAD.  [place] 10/04/17: Elevated R>L heart filling pressures, suggestive of significant RV failure with high CVP/PCWP ratio and  PAPi < 2, CI was 1.9.  RV failure may be related to OHS/OSA with severe mixed pulmonary venous/pulmonary arterial HTN by RHC.  He remains mildly volume overloaded on exam. Weight is down 16 lbs total.  - Continue 80 mg IV lasix BID. Will give a dose of metolazone again today. Consider transitioning to PO tomorrow. He was on lasix 80 mg daily at home.  - Continue home Coreg 12.5 mg BID - Continue Bidil 1 tab tid.  - Continue digoxin 0.125 daily - Continue spironolactone 12.5 daily.  - Holding off on ACEI/ARB/ARNI for now with need for diuresis and elevated creatinine.  - Workup for cardiac amyloidosis: myeloma panel with low total protein ELP, otherwise normal - PYP 10/05/17: Visual and quantitative assessment (grade 0/1, H/CLL equal 1.3) are equivocal for transthyretin amyloidosis. I think this is not suggestive of cardiac amyloidosis.  - Pulmonary hypertension/RV failure would make LVAD a difficult proposition.  2. CKD: Stage 3. Creatinine 1.75 this am, stable.  Had minimal contrast (30 cc) with cath on  6/3.  3. CAD:  LHC 10/04/17: LAD 30% mid vessel stenosis, 50-60% distal LCx stenosis, 50% mid RCA stenosis. CM out of proportion to CAD -> NICM - Continue ASA 81 and statin. Denies CP.  4. Rectal bleeding: Noted on toilet paper Saturday.  He says that he has a history of hemorrhoids. Resolved.  5. Atrial fibrillation: Paroxysmal.  Given history of CVA, should get heparin bridge while off warfarin. Remains in NSR - Heparin bridge to therapeutic INR (back on warfarin). INR 1.26 this am. Dosing per pharmacy.  6. H/o VT: Has ICD. No change.  7. 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  May be due in part to OSA/OHS.  - He is on oxygen during the day and CPAP at night.  Should have pulmonary followup as outpatient.  - V/Q scan has been ordered to rule out chronic PE (though unlikely as on warfarin at home).   Length of Stay: [place] 10/06/2017  Patient seen with NP, agree with the above note.  PYP scan and myeloma panel are not suggestive of cardiac amyloidosis.  He diuresed well again yesterday, creatinine lower.  We decreased Coreg to 12.5 mg bid with soft BP. No lightheadedness.  He is still volume overloaded on exam but improved.  - Will give Lasix 80 mg IV bid + metolazone 2.5 x 1 again today.  - Probably to po (would use torsemide) tomorrow.  - Will be getting V/Q scan to rule out chronic PE today.   [name] 10/06/2017 11:11 AM

## 2017-10-06 NOTE — Progress Notes (Signed)
RT NOTE:  Pt wearing CPAP when RT arrived. RT available if needed.

## 2017-10-06 NOTE — Progress Notes (Signed)
CARDIAC REHAB PHASE I   PRE:  Rate/Rhythm: 72 SR    BP: sitting 95/69    SaO2: 80-87 RA, 93 2L  MODE:  Ambulation: 430 ft   POST:  Rate/Rhythm: 95 SR    BP: sitting 111/77     SaO2: 80 2L, up to 89 3L  Pt on EOB with SaO2 80 RA. Sts he does not feel SOB and that he puts on O2 when he feels SOB. Up to 93 2L. Ambulated well, no SOB but again SAO2 low on 2L after finishing walk. He will need 3-4 L walking, we discussed this. Will f/u. I reviewed HF management, he could preform teach back. [place], ACSM 10/06/2017 3:25 PM

## 2017-10-06 NOTE — Progress Notes (Signed)
Patient sleeping during shift report.      

## 2017-10-06 NOTE — Progress Notes (Signed)
[place] for Heparin/warfarin Indication: atrial fibrillation  Patient Measurements: Height: 6' (182.9 cm) Weight: 274 lb (124.3 kg)(scale a) IBW/kg (Calculated) : 77.6 Heparin Dosing Weight: 107.4 kg  Labs: Recent Labs    10/04/17 0427 10/05/17 0526 10/05/17 1350 10/06/17 0458  HGB 12.9* 12.5*  --  13.7  HCT 42.9 41.7  --  45.8  PLT 219 194  --  227  LABPROT 16.7* 15.3*  --  15.7*  INR 1.36 1.22  --  1.26  HEPARINUNFRC 0.37 0.26* 0.32 0.42  CREATININE 1.91* 1.85*  --  1.75*   Estimated Creatinine Clearance: 60.4 mL/min (A) (by C-G formula based on SCr of 1.75 mg/dL (H)).  Assessment: 62 yo M on Coumadin PTA for PAF.  Admitted with HF exacerbation. Heparin started in anticipation of cath after INR <2 on 6/1. Shortly after heparin was restarted, pt had a bloody bowel movement (light) and infusion was held for two hours. No further episodes have occurred after speaking with his nurse.  Heparin level is at goal, INR trending back up.  Home regimen is 5 mg daily except 10 mg on Monday.  Goal of Therapy:  Heparin level 0.3-0.7 units/ml Monitor platelets by anticoagulation protocol: Yes   Plan:  Continue IV heparin at current rate. Repeat warfarin 7.5 mg tonight Monitor daily heparin level, INR, CBC, and s/sx bleeding  [place] Crane, Roylene Reason, [facility] Clinical Pharmacist Pager [phone]  10/06/2017 10:42 AM

## 2017-10-07 ENCOUNTER — Inpatient Hospital Stay (HOSPITAL_COMMUNITY): Payer: Medicare Other

## 2017-10-07 LAB — BASIC METABOLIC PANEL
ANION GAP: 9 (ref 5–15)
BUN: 23 mg/dL — ABNORMAL HIGH (ref 6–20)
CO2: 38 mmol/L — ABNORMAL HIGH (ref 22–32)
Calcium: 9.2 mg/dL (ref 8.9–10.3)
Chloride: 92 mmol/L — ABNORMAL LOW (ref 101–111)
Creatinine, Ser: 1.75 mg/dL — ABNORMAL HIGH (ref 0.61–1.24)
GFR, EST AFRICAN AMERICAN: 47 mL/min — AB (ref >60)
GFR, EST NON AFRICAN AMERICAN: 40 mL/min — AB (ref >60)
GLUCOSE: 80 mg/dL (ref 65–99)
Potassium: 3.6 mmol/L (ref 3.5–5.1)
Sodium: 139 mmol/L (ref 135–145)

## 2017-10-07 LAB — PROTIME-INR
INR: 1.4
PROTHROMBIN TIME: 17.1 s — AB (ref 11.4–15.2)

## 2017-10-07 LAB — CBC WITH DIFFERENTIAL/PLATELET
Abs Immature Granulocytes: 0 10*3/uL (ref 0.0–0.1)
BASOS PCT: 1 %
Basophils Absolute: 0 10*3/uL (ref 0.0–0.1)
EOS ABS: 0.1 10*3/uL (ref 0.0–0.7)
Eosinophils Relative: 2 %
HCT: 45.1 % (ref 39.0–52.0)
Hemoglobin: 13.7 g/dL (ref 13.0–17.0)
IMMATURE GRANULOCYTES: 0 %
Lymphocytes Relative: 17 %
Lymphs Abs: 1 10*3/uL (ref 0.7–4.0)
MCH: 28.4 pg (ref 26.0–34.0)
MCHC: 30.4 g/dL (ref 30.0–36.0)
MCV: 93.6 fL (ref 78.0–100.0)
Monocytes Absolute: 0.9 10*3/uL (ref 0.1–1.0)
Monocytes Relative: 14 %
NEUTROS PCT: 66 %
Neutro Abs: 3.9 10*3/uL (ref 1.7–7.7)
PLATELETS: 202 10*3/uL (ref 150–400)
RBC: 4.82 MIL/uL (ref 4.22–5.81)
RDW: 16.6 % — AB (ref 11.5–15.5)
WBC: 6 10*3/uL (ref 4.0–10.5)

## 2017-10-07 LAB — HEPARIN LEVEL (UNFRACTIONATED)
Heparin Unfractionated: 0.74 IU/mL — ABNORMAL HIGH (ref 0.30–0.70)
Heparin Unfractionated: 0.83 IU/mL — ABNORMAL HIGH (ref 0.30–0.70)

## 2017-10-07 LAB — GLUCOSE, CAPILLARY
GLUCOSE-CAPILLARY: 101 mg/dL — AB (ref 65–99)
Glucose-Capillary: 155 mg/dL — ABNORMAL HIGH (ref 65–99)

## 2017-10-07 MED ORDER — CARVEDILOL 25 MG PO TABS: 12.5000 mg | ORAL_TABLET | Freq: Two times a day (BID) | ORAL | 0 refills | Status: DC

## 2017-10-07 MED ORDER — SPIRONOLACTONE 25 MG PO TABS: 12.5000 mg | ORAL_TABLET | Freq: Every day | ORAL | 6 refills | Status: DC

## 2017-10-07 MED ORDER — POTASSIUM CHLORIDE CRYS ER 20 MEQ PO TBCR
20.0000 meq | EXTENDED_RELEASE_TABLET | Freq: Once | ORAL | Status: AC
  Administered 2017-10-07: 20 meq via ORAL
  Filled 2017-10-07: qty 1

## 2017-10-07 MED ORDER — TORSEMIDE 20 MG PO TABS: 60.0000 mg | ORAL_TABLET | Freq: Every day | ORAL | 6 refills | Status: DC

## 2017-10-07 MED ORDER — WARFARIN SODIUM 5 MG PO TABS
5.0000 mg | ORAL_TABLET | Freq: Once | ORAL | Status: AC
  Administered 2017-10-07: 5 mg via ORAL
  Filled 2017-10-07: qty 2

## 2017-10-07 MED ORDER — ENOXAPARIN SODIUM 120 MG/0.8ML ~~LOC~~ SOLN
1.0000 mg/kg | Freq: Two times a day (BID) | SUBCUTANEOUS | Status: AC
  Administered 2017-10-07: 120 mg via SUBCUTANEOUS
  Filled 2017-10-07: qty 0.8

## 2017-10-07 MED ORDER — ENOXAPARIN SODIUM 120 MG/0.8ML ~~LOC~~ SOLN: 1.0000 mg/kg | Freq: Two times a day (BID) | SUBCUTANEOUS | 0 refills | Status: DC

## 2017-10-07 MED ORDER — TORSEMIDE 20 MG PO TABS
60.0000 mg | ORAL_TABLET | Freq: Every day | ORAL | Status: DC
Start: 2017-10-08 — End: 2017-10-07

## 2017-10-07 MED ORDER — WARFARIN SODIUM 5 MG PO TABS: 5.0000 mg | ORAL_TABLET | Freq: Once | ORAL | Status: DC

## 2017-10-07 MED ORDER — TECHNETIUM TC 99M DIETHYLENETRIAME-PENTAACETIC ACID
31.0000 | Freq: Once | INTRAVENOUS | Status: AC | PRN
  Administered 2017-10-07: 31 via RESPIRATORY_TRACT

## 2017-10-07 MED ORDER — TECHNETIUM TO 99M ALBUMIN AGGREGATED
4.2000 | Freq: Once | INTRAVENOUS | Status: AC | PRN
  Administered 2017-10-07: 4.2 via INTRAVENOUS

## 2017-10-07 MED ORDER — ISOSORB DINITRATE-HYDRALAZINE 20-37.5 MG PO TABS: 1.0000 | ORAL_TABLET | Freq: Three times a day (TID) | ORAL | 6 refills | Status: DC

## 2017-10-07 MED ORDER — DIGOXIN 125 MCG PO TABS: 0.1250 mg | ORAL_TABLET | Freq: Every day | ORAL | 6 refills | Status: DC

## 2017-10-07 NOTE — Progress Notes (Addendum)
ANTICOAGULATION CONSULT NOTE - Follow Up Consult  Pharmacy Consult for heparin Indication: atrial fibrillation  Labs: Recent Labs    10/05/17 0526  10/06/17 0458 10/07/17 0356 10/07/17 1156  HGB 12.5*  --  13.7 13.7  --   HCT 41.7  --  45.8 45.1  --   PLT 194  --  227 202  --   LABPROT 15.3*  --  15.7* 17.1*  --   INR 1.22  --  1.26 1.40  --   HEPARINUNFRC 0.26*   < > 0.42 0.74* 0.83*  CREATININE 1.85*  --  1.75* 1.75*  --    < > = values in this interval not displayed.    Assessment: 62 yo M on Coumadin PTA for PAF.  Admitted with HF exacerbation. Heparin started in anticipation of cath after INR <2 on 6/1. Shortly after heparin was restarted, pt had a bloody bowel movement (light) and infusion was held for two hours. No further episodes have occurred after speaking with his nurse. Now s/p cath and bridging with heparin infusion and warfarin therapy.  Heparin level came back supratherapeutic at 0.83, on 1500 units/hr despite previous rate decrease. Heparin infusion is running into R hand and level was drawn from L AC. CBC stable. No s/sx of bleeding. No infusion issues. Plan to change to enoxaparin at discharge.   Goal of Therapy:  Heparin level 0.3-0.7 units/ml   Plan:  -Decrease heparin infusion to 1350 units/hr -Plan to transition to enoxaparin 1 mg/kg (120 mg) every 12 hours at time of discharge -Order warfarin 5 mg tonight - plan to resume home regimen at time of discharge  -Has INR appointment established for October 12, 2017 at [place], PharmD Clinical Pharmacist  Pager: [phone] Phone: [phone] 10/07/2017,1:22 PM  ADDENDUM Plan to transition to enoxaparin 1 mg/kg (120 mg) once at 1600 - RN aware can discontinue heparin infusion at 1500 (1 hour prior to administration). Will give warfarin dose tonight prior to discharge and patient aware doesn't need to take home when arrives home.  Doylene Canard, PharmD Clinical Pharmacist

## 2017-10-07 NOTE — Progress Notes (Signed)
Pt discharged via wheelchair. Pt is not in distress. Pt belongings with pt.

## 2017-10-07 NOTE — Progress Notes (Addendum)
Patient ID: [name], male   DOB: [birth year], 62 y.o.   MRN: [id number]     Advanced Heart Failure Rounding Note  PCP-Cardiologist: Minus Breeding, MD   Subjective:    Weight down another 5 lbs (31 lbs total)  with 80 mg IV lasix BID + 2.5 metolazone. I/O -1.2 L Creatinine stable 1.75. SBP 90-100s  Walked with cardiac rehab yesterday. Desats to 80% on RA. Required 3-4 L while walking.    Continues heparin/coumadin bridge. INR added onto labs this am and is pending.   Feels great this morning. No CP, SOB, dizziness. Hopeful to go home today.   V/Q scan 10/07/17: No appreciable ventilation or perfusion defects.  PYP 10/05/17: Visual and quantitative assessment (grade 0/1, H/CLL equal 1.3) are equivocal for transthyretin amyloidosis.  LHC 10/04/17: Left Main  No significant disease.  Left Anterior Descending  Large, wrap-around LAD with 30% mid vessel stenosis.  Left Circumflex  50-60% distal LCx stenosis.  Right Coronary Artery  50% mid RCA stenosis.   [place] 10/04/17: RA mean 19 RV 72/23 PA 74/37, mean 51 PCWP mean 19 LV 111/28 AO 112/80 Oxygen saturations: PA 55% AO 95% Cardiac Output (Fick) 4.68  Cardiac Index (Fick) 1.9  PVR 6.8 WU CVP/PCWP 1 PAPi 1.95  1. Nonischemic cardiomyopathy.  Coronary disease well out of proportion to degree of cardiomyopathy.  2. Elevated R>L heart filling pressures, suggestive of significant RV failure with high CVP/PCWP ratio and PAPi < 2.  3. Low cardiac output.  4. Mixed pulmonary venous/pulmonary arterial hypertension, ?due to hypoxemia from OSA or OHS/OSA.    Objective:   Weight Range: 269 lb 8 oz (122.2 kg) Body mass index is 36.55 kg/m.   Vital Signs:   Temp:  [98.2 F (36.8 C)-98.5 F (36.9 C)] 98.2 F (36.8 C) (06/06 0551) Pulse Rate:  [65-98] 71 (06/06 0911) Resp:  [16-20] 18 (06/06 0551) BP: (95-107)/(62-85) 107/81 (06/06 0911) SpO2:  [96 %-98 %] 98 % (06/06 0551) Weight:  [269 lb 8 oz (122.2 kg)] 269 lb 8 oz  (122.2 kg) (06/06 0551) Last BM Date: 10/03/17  Weight change: Filed Weights   10/05/17 0500 10/06/17 0538 10/07/17 0551  Weight: 279 lb 4.8 oz (126.7 kg) 274 lb (124.3 kg) 269 lb 8 oz (122.2 kg)    Intake/Output:   Intake/Output Summary (Last 24 hours) at 10/07/2017 1020 Last data filed at 10/07/2017 1002 Gross per 24 hour  Intake 1025.61 ml  Output 2775 ml  Net -1749.39 ml      Physical Exam    General: Well appearing. No resp difficulty. HEENT: Normal Neck: Supple. JVP 6-7. Carotids 2+ bilat; no bruits. No thyromegaly or nodule noted. Cor: PMI nonpalpable. RRR, No M/G/R noted Lungs: CTAB, normal effort. Abdomen: Soft, non-tender, non-distended, no HSM. No bruits or masses. +BS  Extremities: No cyanosis, clubbing, or rash. R and LLE no edema.  Neuro: Alert & orientedx3, cranial nerves grossly intact. moves all 4 extremities w/o difficulty. Affect pleasant  Telemetry   NSR 70-80s with 1st degree AV block. Personally reviewed.   Labs    CBC Recent Labs    10/06/17 0458 10/07/17 0356  WBC 6.3 6.0  NEUTROABS 4.7 3.9  HGB 13.7 13.7  HCT 45.8 45.1  MCV 93.9 93.6  PLT 227 856   Basic Metabolic Panel Recent Labs    10/06/17 0458 10/07/17 0356  NA 142 139  K 4.1 3.6  CL 98* 92*  CO2 34* 38*  GLUCOSE 84 80  BUN  25* 23*  CREATININE 1.75* 1.75*  CALCIUM 9.0 9.2  MG 2.1  --    Liver Function Tests No results for input(s): AST, ALT, ALKPHOS, BILITOT, PROT, ALBUMIN in the last 72 hours. No results for input(s): LIPASE, AMYLASE in the last 72 hours. Cardiac Enzymes No results for input(s): CKTOTAL, CKMB, CKMBINDEX, TROPONINI in the last 72 hours.  BNP: BNP (last 3 results) Recent Labs    07/27/17 1114 10/01/17 1346  BNP 787.1* 1,883.8*    ProBNP (last 3 results) No results for input(s): PROBNP in the last 8760 hours.   D-Dimer No results for input(s): DDIMER in the last 72 hours. Hemoglobin A1C No results for input(s): HGBA1C in the last 72  hours. Fasting Lipid Panel No results for input(s): CHOL, HDL, LDLCALC, TRIG, CHOLHDL, LDLDIRECT in the last 72 hours. Thyroid Function Tests No results for input(s): TSH, T4TOTAL, T3FREE, THYROIDAB in the last 72 hours.  Invalid input(s): FREET3  Other results:   Imaging    Dg Chest 1 View  Result Date: 10/07/2017 CLINICAL DATA:  Shortness of Breath EXAM: CHEST  1 VIEW COMPARISON:  October 02, 2017 FINDINGS: There is cardiomegaly with pulmonary vascularity within normal limits. There is slight interstitial pulmonary edema. There is no airspace consolidation. There is a small left pleural effusion. Pacemaker lead is attached to the right ventricle. No adenopathy evident. Lower thoracic compression fracture seen on recent study is not well seen on frontal only view. IMPRESSION: Persistent cardiomegaly with mild interstitial pulmonary edema. No consolidation. No adenopathy evident. Electronically Signed   By: Lowella Grip III M.D.   On: 10/07/2017 09:40   Nm Pulmonary Perf And Vent  Result Date: 10/07/2017 CLINICAL DATA:  Shortness of breath and chest pain EXAM: NUCLEAR MEDICINE VENTILATION - PERFUSION LUNG SCAN VIEWS: Anterior, posterior, RPO, LPO, RAO, LAO-ventilation and perfusion. Patient could not tolerate lateral imaging. RADIOPHARMACEUTICALS:  31.0 mCi of Tc-99m DTPA aerosol inhalation and 4.2 mCi Tc64m-MAA IV COMPARISON:  Chest radiograph October 07, 2017 FINDINGS: Ventilation: Radiotracer uptake bilaterally is homogeneous and symmetric. No appreciable ventilation defects evident. There is cardiomegaly. Perfusion: Radiotracer uptake bilaterally is homogeneous and symmetric. There are no appreciable perfusion defects. Cardiomegaly noted. IMPRESSION: No appreciable ventilation or perfusion defects. This study constitutes a very low probability of pulmonary embolus. Cardiomegaly noted. Electronically Signed   By: Lowella Grip III M.D.   On: 10/07/2017 09:38     Medications:      Scheduled Medications: . allopurinol  300 mg Oral Daily  . amiodarone  200 mg Oral Daily  . aspirin EC  81 mg Oral Daily  . atorvastatin  80 mg Oral QHS  . carvedilol  12.5 mg Oral BID WC  . cholecalciferol  1,000 Units Oral Daily  . digoxin  0.125 mg Oral Daily  . furosemide  80 mg Intravenous BID  . insulin aspart  0-9 Units Subcutaneous TID WC  . insulin glargine  10 Units Subcutaneous Q2200  . isosorbide-hydrALAZINE  1 tablet Oral TID  . potassium chloride SA  20 mEq Oral Daily  . sodium chloride flush  3 mL Intravenous Q12H  . spironolactone  12.5 mg Oral Daily  . Warfarin - Pharmacist Dosing Inpatient   Does not apply q1800    Infusions: . sodium chloride    . heparin 1,500 Units/hr (10/07/17 0600)    PRN Medications: sodium chloride, acetaminophen, magnesium hydroxide, ondansetron (ZOFRAN) IV, sodium chloride flush, traMADol    Assessment/Plan   1. Acute on chronic systolic CHF: Nonischemic  cardiomyopathy.  Echo in 4/19 showed worsening of LV systolic function, EF down to 20-25%.  There was moderate LVH.  Cause of cardiomyopathy uncertain.  Patient has had difficult-to-control HTN.  Rare ETOH use.  Never worked up for amyloidosis. He has a Research officer, political party ICD, cannot get cardiac MRI.  Narrow QRS in past, not candidate for CRT upgrade. LHC 10/04/17 with mild CAD, cardiomyopathy out of proportion to CAD.  [place] 10/04/17: Elevated R>L heart filling pressures, suggestive of significant RV failure with high CVP/PCWP ratio and PAPi < 2, CI was 1.9.  RV failure may be related to OHS/OSA with severe mixed pulmonary venous/pulmonary arterial HTN by RHC.  Volume status stable on exam. Weight is down 31 lbs total  - Consider transitioning to torsemide today. Previously on 80 mg PO lasix daily. - Continue Coreg 12.5 mg BID - Continue Bidil 1 tab tid. Will have CM check price for home.  - Continue digoxin 0.125 daily - Continue spironolactone 12.5 daily.  - Holding off on ACEI/ARB/ARNI for now  with need for diuresis and elevated creatinine.  - Workup for cardiac amyloidosis: myeloma panel with low total protein ELP, otherwise normal - PYP 10/05/17: Visual and quantitative assessment (grade 0/1, H/CLL equal 1.3) are equivocal for transthyretin amyloidosis. I think this is not suggestive of cardiac amyloidosis.  - Pulmonary hypertension/RV failure would make LVAD a difficult proposition. V/Q scan negative.  2. CKD: Stage 3. Creatinine 1.75this am, stable.  Had minimal contrast (30 cc) with cath on 6/3.  3. CAD:  LHC 10/04/17: LAD 30% mid vessel stenosis, 50-60% distal LCx stenosis, 50% mid RCA stenosis. CM out of proportion to CAD -> NICM - Continue ASA 81 and statin. Denies CP 4. Rectal bleeding: Noted on toilet paper Saturday.  He says that he has a history of hemorrhoids. Resolved 5. Atrial fibrillation: Paroxysmal.  Given history of CVA, should get heparin bridge while off warfarin. Remains in NSR - Heparin bridge to therapeutic INR (back on warfarin). INR pending this am. Dosing per pharmacy.  6. H/o VT: Has ICD. No change 7. 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  May be due in part to OSA/OHS.  - He is on oxygen during the day and CPAP at night.  Should have pulmonary followup as outpatient.  - V/Q scan with no deficits.   Pt uses a mail order pharmacy [facility] [address]. Will have CM check price for bidil.  Will arrange hospital follow up. May need to wait to discharge until INR therapeutic (pending this am). Will discuss with Dr Aundra Dubin.   Length of Stay: [place], NP 10/07/2017 10:20 AM   Patient seen with NP, agree with the above note.  Weight is done, JVP is down.  He looks good today.  I think that he can go home.  He will need followup in CHF clinic.  With OHS/OSA, he needs to wear oxygen during the day and use CPAP at night.   Meds for home:  Warfarin (Lovenox bridge until INR therapeutic) Coreg 12.5 mg bid.   Bidil 1 tab tid Digoxin 0.125 daily Spironolactone 12.5 daily Atorvastatin 80 daily Amiodarone 200 daily Torsemide 60 daily KCl 20 daily  [name] 10/07/2017 12:47 PM

## 2017-10-07 NOTE — Discharge Summary (Signed)
Advanced Heart Failure Discharge Note  Discharge Summary   Patient ID: [name] MRN: [id number], DOB/AGE: 05-11-1955 62 y.o. Admit date: 10/01/2017 D/C date:     10/07/2017   Primary Discharge Diagnoses:  1. 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 CKD stage 3 3. CAD 4. Rectal bleeding 5. Atrial fibrillation  - On coumadin, followed by coumadin clinic on [facility]. Discharged with Lovenox for subtherapeutic INR with hx of CVA 6. Hx of VT 7. Pulmonary HTN - Chronic O2   Hospital Course: 62 yo with history of longstanding cardiomyopathy out of proportion to CAD/chronic systolic CHF (EF 46-65% 01/9356), prior VT, s/p St Jude ICD, CAD, CVA, OSA on CPAP, pulmonary HTN on O2 at home, PAF on coumadin, HTN, and CKD stage 3.  Admitted from Dr Hochrein's office 10/01/17 with volume overload. HF team was consulted. Underwent R/LHC 10/04/17 - full report below. PYP scan and myeloma panel completed and were not suggestive of cardiac amyloidosis. V/Q scan completed for elevated PA pressures on cath, which was negative. He diuresed 31 lbs with IV lasix and metolazone and transitioned to torsemide 60 mg daily. HF meds were optimized. No ACE/ARB with CKD and soft BPs.   1. Acute on chronic systolic CHF: NICM. Echo in 4/19 showed worsening of LV systolic function, EF down to 20-25%. There was moderate LVH. Cause of cardiomyopathy uncertain. Patient has had difficult-to-control HTN. Rare ETOH use. Never worked up for amyloidosis. He has a [employer] officer, political party ICD, cannot get cardiac MRI. Narrow QRS in past, not candidate for CRT upgrade. LHC 10/04/17 with mild CAD, cardiomyopathy out of proportion to CAD.  [place] 10/04/17: Elevated R>L heart filling pressures, suggestive of significant RV failure with high CVP/PCWP ratio and PAPi <2, CI was 1.9.  RV failure may be related to OHS/OSA with severe mixed pulmonary venous/pulmonary arterial HTN by RHC.  - Diuresed 31 lbs with IV lasix + metolazone. Transitioned to torsemide 60  mg daily for discharge.  - Continue Coreg 12.5 mg BID. Dose was decreased due to soft BPs  - Continue Bidil 1 tab TID - Continue digoxin 0.125 daily. Needs digoxin level at follow up.  - Continue spironolactone 12.5 daily.  - Holding off on ACEI/ARB/ARNI for now with CKD and soft BPs. Consider outpatient.  - Workup for cardiac amyloidosis: myeloma panel and PYP scan not suggestive of cardiac amyloidosis.  - Pulmonary hypertension/RV failure would make LVAD a difficult proposition.   2. CKD: Stage 3. BMET monitored daily while inpatient. -  Creatinine at baseline 1.75 on day of discharge. He had minimal contrast (30 cc) with cath on 6/3.  3. CAD: LHC 10/04/17: LAD 30% mid vessel stenosis, 50-60% distal LCx stenosis, 50% mid RCA stenosis. CM out of proportion to CAD -> NICM - Continue ASA 81 and statin.  - No s/s ischemia 4. Rectal bleeding:  - Noted on toilet paper 6/1. He says that he has a history of hemorrhoids. No other occurrences. CBC monitored and remained stable.  5. Atrial fibrillation: Paroxysmal. Remained in NSR. With history of CVA, heparin was used while on coumadin and while subtherapeutic.  - On heparin prior to [facility]. Coumadin restarted post cath with heparin bridge. INR on day of discharge 1.4. He is agreeable to Lovenox injections at discharge. Follow-up in Coumadin clinic on [facility] has been arranged for 10/12/17.   6. H/o VT: Has ICD. 7. Pulmonary hypertension: Severe mixed pulmonary venous/pulmonary arterial HTN on RHC 10/04/17.  May be due in  part to OSA/OHS.  - He is chronically on oxygen during the day and wears CPAP at night.  - V/Q scan negative.  - He will need to be referred to pulmonary at follow up.    He was referred to cardiac rehab. He will be followed closely in HF clinic, with appointment as below. He will need BMET and digoxin level at that appointment. He will also need referral to pulmonary. He will have [facility] on [facility].   Discharge  Weight Range: 269 lbs Discharge Vitals: Blood pressure 93/63, pulse 73, temperature 98.3 F (36.8 C), temperature source Oral, resp. rate 18, height 6' (1.829 m), weight 269 lb 8 oz (122.2 kg), SpO2 95 %.  Labs: Lab Results  Component Value Date   WBC 6.0 10/07/2017   HGB 13.7 10/07/2017   HCT 45.1 10/07/2017   MCV 93.6 10/07/2017   PLT 202 10/07/2017    Recent Labs  Lab 10/01/17 1346  10/07/17 0356  NA 146*   < > 139  K 3.7   < > 3.6  CL 106   < > 92*  CO2 34*   < > 38*  BUN 19   < > 23*  CREATININE 1.64*   < > 1.75*  CALCIUM 8.4*   < > 9.2  PROT 5.2*  --   --   BILITOT 2.0*  --   --   ALKPHOS 57  --   --   ALT 60  --   --   AST 33  --   --   GLUCOSE 105*   < > 80   < > = values in this interval not displayed.   Lab Results  Component Value Date   CHOL 114 10/02/2017   HDL 24 (L) 10/02/2017   LDLCALC 79 10/02/2017   TRIG 55 10/02/2017   BNP (last 3 results) Recent Labs    07/27/17 1114 10/01/17 1346  BNP 787.1* 1,883.8*    ProBNP (last 3 results) No results for input(s): PROBNP in the last 8760 hours.   Diagnostic Studies/Procedures   V/Q scan 10/07/17: No appreciable ventilation or perfusion defects.  PYP 10/05/17: Visual and quantitative assessment (grade 0/1, H/CLL equal 1.3) are equivocal for transthyretin amyloidosis.  LHC 10/04/17: Left Main  No significant disease.  Left Anterior Descending  Large, wrap-around LAD with 30% mid vessel stenosis.  Left Circumflex  50-60% distal LCx stenosis.  Right Coronary Artery  50% mid RCA stenosis.   [place] 10/04/17: RA mean 19 RV 72/23 PA 74/37, mean 51 PCWP mean 19 LV 111/28 AO 112/80 Oxygen saturations: PA 55% AO 95% Cardiac Output (Fick) 4.68  Cardiac Index (Fick) 1.9  PVR 6.8 WU CVP/PCWP 1 PAPi 1.95  1. Nonischemic cardiomyopathy. Coronary disease well out of proportion to degree of cardiomyopathy.  2. Elevated R>L heart filling pressures, suggestive of significant RV failure with high  CVP/PCWP ratio and PAPi <2.  3. Low cardiac output.  4. Mixed pulmonary venous/pulmonary arterial hypertension, ?due to hypoxemia from OSA or OHS/OSA.   Discharge Medications   Allergies as of 10/07/2017   No Known Allergies     Medication List    STOP taking these medications   furosemide 80 MG tablet Commonly known as:  LASIX     TAKE these medications   acetaminophen 500 MG tablet Commonly known as:  TYLENOL Take 500-1,000 mg by mouth every 6 (six) hours as needed (for bilateral knee pain).   allopurinol 300 MG tablet  Commonly known as:  ZYLOPRIM TAKE 1 TABLET BY MOUTH  DAILY   amiodarone 200 MG tablet Commonly known as:  PACERONE TAKE 1 TABLET BY MOUTH  DAILY   aspirin 81 MG EC tablet Take 1 tablet (81 mg total) by mouth daily.   atorvastatin 80 MG tablet Commonly known as:  LIPITOR TAKE 1 TABLET BY MOUTH AT  BEDTIME   carvedilol 25 MG tablet Commonly known as:  COREG Take 0.5 tablets (12.5 mg total) by mouth 2 (two) times daily with a meal. What changed:  how much to take   digoxin 0.125 MG tablet Commonly known as:  LANOXIN Take 1 tablet (0.125 mg total) by mouth daily. Start taking on:  10/08/2017   enoxaparin 120 MG/0.8ML injection Commonly known as:  LOVENOX Inject 0.81 mLs (120 mg total) into the skin every 12 (twelve) hours for 4 days. Start taking on:  10/08/2017   Insulin Glargine 100 UNIT/ML Solostar Pen Commonly known as:  LANTUS SOLOSTAR INJECT 10 UNITS INTO THE SKIN AT BEDTIME.   isosorbide-hydrALAZINE 20-37.5 MG tablet Commonly known as:  BIDIL Take 1 tablet by mouth 3 (three) times daily.   nitroGLYCERIN 0.4 MG SL tablet Commonly known as:  NITROSTAT PLACE 1 TABLET UNDER TONGUE EVERY 5 MINS X3 DOSES AS NEEDED FOR CHEST PAIN   potassium chloride SA 20 MEQ tablet Commonly known as:  K-DUR,KLOR-CON Take 1 tablet (20 mEq total) by mouth daily.   spironolactone 25 MG tablet Commonly known as:  ALDACTONE Take 0.5 tablets (12.5 mg total) by  mouth daily. Start taking on:  10/08/2017   torsemide 20 MG tablet Commonly known as:  DEMADEX Take 3 tablets (60 mg total) by mouth daily. Start taking on:  10/08/2017   traMADol 50 MG tablet Commonly known as:  ULTRAM Take 1 tablet (50 mg total) by mouth every 8 (eight) hours as needed. What changed:  reasons to take this   Turmeric 500 MG Caps Take 1,000 mg by mouth daily as needed (inflammation).   VITAMIN D-3 PO Take 1 capsule by mouth daily.   warfarin 5 MG tablet Commonly known as:  COUMADIN Take as directed. If you are unsure how to take this medication, talk to your nurse or doctor. Original instructions:  Take 5-10 mg by mouth See admin instructions. 10mg  on MON and FRI, 5mg  on all other days.       Disposition   The patient will be discharged in stable condition to home. Discharge Instructions    (HEART FAILURE PATIENTS) Call MD:  Anytime you have any of the following symptoms: 1) 3 pound weight gain in 24 hours or 5 pounds in 1 week 2) shortness of breath, with or without a dry hacking cough 3) swelling in the hands, feet or stomach 4) if you have to sleep on extra pillows at night in order to breathe.   Complete by:  As directed    Amb Referral to Cardiac Rehabilitation   Complete by:  As directed    Diagnosis:  Heart Failure (see criteria below if ordering Phase II)   Heart Failure Type:  Chronic Systolic   Call MD for:  persistant dizziness or light-headedness   Complete by:  As directed    Diet - low sodium heart healthy   Complete by:  As directed    Heart Failure patients record your daily weight using the same scale at the same time of day   Complete by:  As directed    Increase activity  slowly   Complete by:  As directed    STOP any activity that causes chest pain, shortness of breath, dizziness, sweating, or exessive weakness   Complete by:  As directed      Follow-up Information    [place] Follow up on 10/12/2017.     Specialty:  Internal Medicine Why:  11 am for INR check.  Contact information: [place] [address] [place], Amy D, NP Follow up on 10/19/2017.   Specialty:  Cardiology Why:  Heart Failure Followup-11:30-Parking at ER lot (enter uner blue "Specialty Clinics" awning) or under [place] on [address], materials entrance, Garage Code: [id number], elevator 1st floor). Take all am meds, bring all med bottles. Contact information: [address] [place] [address] [phone]             Duration of Discharge Encounter: Greater than 35 minutes   Signed, Georgiana Shore, NP 10/07/2017, 2:27 PM

## 2017-10-07 NOTE — Discharge Instructions (Signed)
Please resume home oxygen at previously prescribed dose.

## 2017-10-07 NOTE — Progress Notes (Signed)
ANTICOAGULATION CONSULT NOTE - Follow Up Consult  Pharmacy Consult for heparin Indication: atrial fibrillation  Labs: Recent Labs    10/05/17 0526 10/05/17 1350 10/06/17 0458 10/07/17 0356  HGB 12.5*  --  13.7 13.7  HCT 41.7  --  45.8 45.1  PLT 194  --  227 202  LABPROT 15.3*  --  15.7*  --   INR 1.22  --  1.26  --   HEPARINUNFRC 0.26* 0.32 0.42 0.74*  CREATININE 1.85*  --  1.75*  --     Assessment: 62yo male supratherapeutic on heparin two levels at goal though had been trending up, apparently accumulating; no gtt issues or signs of bleeding per RN.  Goal of Therapy:  Heparin level 0.3-0.7 units/ml   Plan:  Will decrease heparin gtt by 1 unit/kg/hr to 1500 units/hr and check level in 6 hours.    Wynona Neat, PharmD, BCPS  10/07/2017,5:51 AM

## 2017-10-07 NOTE — Progress Notes (Addendum)
Benefit check in progress for Bidil Bennye [facility] [phone]   RE: Benefit check  Received: Today  Message Contents  Memory Argue CMA        # 3. S/W JOVANNA @ [facility] # [phone]    BIDIL 20-37.5 MG PER TABLET ONE TABLET 3 X DAILY  COVER- YES  CO-PAY- $ 8.50  TIER- 3 DRUG  PRIOR APPROVAL- NO   PREFERRED PHARMACY : YES [facility]       RE: Benefit check  Received: Today  Message Contents  Memory Argue [facility]        # #  S/3  [facility]  @ [place] [facility]  # [phone]    1. LOVENOX 120 MG BID   COVER- NOT COVER  PRIOR APPROVAL- YES # [id number]    2. ENOXAPARIN 120 MG BID  COVER- YES  CO-PAY- $ 95.00  TIER- 4 DRUG  PRIOR APPROVAL- N0   DEDUCTIBLE: NOT MET   PREFERRED PHARMACY : [facility]

## 2017-10-07 NOTE — Progress Notes (Signed)
Pt discharge instructions reviewed with pt. Pt verbalizes understanding. Pt states he has no questions. Pt belongings with pt. Pt is not in distress. Pt will receive at lovenox and warfarin at 1600 before discharge. Pt states his daughter is coming to drive him home.

## 2017-10-12 ENCOUNTER — Telehealth (HOSPITAL_COMMUNITY): Payer: Self-pay

## 2017-10-12 ENCOUNTER — Ambulatory Visit (INDEPENDENT_AMBULATORY_CARE_PROVIDER_SITE_OTHER): Payer: Medicare Other | Admitting: General Practice

## 2017-10-12 DIAGNOSIS — Z8679 Personal history of other diseases of the circulatory system: Secondary | ICD-10-CM

## 2017-10-12 DIAGNOSIS — I4891 Unspecified atrial fibrillation: Secondary | ICD-10-CM

## 2017-10-12 DIAGNOSIS — Z7901 Long term (current) use of anticoagulants: Secondary | ICD-10-CM | POA: Diagnosis not present

## 2017-10-12 LAB — POCT INR: INR: 1.9 — AB (ref 2.0–3.0)

## 2017-10-12 NOTE — Patient Instructions (Addendum)
Pre visit review using our clinic review tool, if applicable. No additional management support is needed unless otherwise documented below in the visit note.  Take 1 1/2 tablets today (6/11) and then continue to take 1 tablet daily except 2 tablets on Monday and Fridays.   Re-check in 4 weeks. Stop Lovenox

## 2017-10-12 NOTE — Telephone Encounter (Signed)
Called patient to see if he is interested in the Cardiac Rehab program. Patient stated he is interested and he will only participate 2 days a week due to financial reasons. Explained scheduling process and went over insurance, patient verbalized understanding. Will contact patient for scheduling upon review by the RN navigator.

## 2017-10-12 NOTE — Telephone Encounter (Signed)
Patients insurance is active and benefits verified through [facility] - $20.00 co-pay, no deductible, out of pocket amount of $6,700/$1,984.11 has been met, no co-insurance, and no pre-authorization is required. Passport/reference [phone]  Will contact patient to see if he is interested in the Cardiac Rehab Program. If interested, patient will be contacted for scheduling upon review by the RN Navigator.

## 2017-10-19 ENCOUNTER — Ambulatory Visit (HOSPITAL_COMMUNITY)
Admission: RE | Admit: 2017-10-19 | Discharge: 2017-10-19 | Disposition: A | Discharge status: Home / Self Care | Payer: Medicare Other | Source: Ambulatory Visit | Attending: Internal Medicine | Admitting: Internal Medicine

## 2017-10-19 ENCOUNTER — Ambulatory Visit: Payer: Medicare Other

## 2017-10-19 ENCOUNTER — Telehealth: Payer: Self-pay | Admitting: *Deleted

## 2017-10-19 VITALS — BP 94/72 | HR 85 | Wt 272.0 lb

## 2017-10-19 DIAGNOSIS — I5022 Chronic systolic (congestive) heart failure: Secondary | ICD-10-CM | POA: Insufficient documentation

## 2017-10-19 DIAGNOSIS — Z87891 Personal history of nicotine dependence: Secondary | ICD-10-CM | POA: Diagnosis not present

## 2017-10-19 DIAGNOSIS — Z9981 Dependence on supplemental oxygen: Secondary | ICD-10-CM | POA: Diagnosis not present

## 2017-10-19 DIAGNOSIS — Z7901 Long term (current) use of anticoagulants: Secondary | ICD-10-CM | POA: Diagnosis not present

## 2017-10-19 DIAGNOSIS — G4733 Obstructive sleep apnea (adult) (pediatric): Secondary | ICD-10-CM | POA: Diagnosis not present

## 2017-10-19 DIAGNOSIS — Z8673 Personal history of transient ischemic attack (TIA), and cerebral infarction without residual deficits: Secondary | ICD-10-CM | POA: Insufficient documentation

## 2017-10-19 DIAGNOSIS — I2721 Secondary pulmonary arterial hypertension: Secondary | ICD-10-CM | POA: Diagnosis not present

## 2017-10-19 DIAGNOSIS — I472 Ventricular tachycardia, unspecified: Secondary | ICD-10-CM

## 2017-10-19 DIAGNOSIS — Z794 Long term (current) use of insulin: Secondary | ICD-10-CM | POA: Insufficient documentation

## 2017-10-19 DIAGNOSIS — N183 Chronic kidney disease, stage 3 unspecified: Secondary | ICD-10-CM

## 2017-10-19 DIAGNOSIS — Z95810 Presence of automatic (implantable) cardiac defibrillator: Secondary | ICD-10-CM | POA: Insufficient documentation

## 2017-10-19 DIAGNOSIS — I428 Other cardiomyopathies: Secondary | ICD-10-CM | POA: Insufficient documentation

## 2017-10-19 DIAGNOSIS — Z79899 Other long term (current) drug therapy: Secondary | ICD-10-CM | POA: Diagnosis not present

## 2017-10-19 DIAGNOSIS — I480 Paroxysmal atrial fibrillation: Secondary | ICD-10-CM | POA: Insufficient documentation

## 2017-10-19 DIAGNOSIS — M109 Gout, unspecified: Secondary | ICD-10-CM | POA: Diagnosis not present

## 2017-10-19 DIAGNOSIS — E1122 Type 2 diabetes mellitus with diabetic chronic kidney disease: Secondary | ICD-10-CM | POA: Insufficient documentation

## 2017-10-19 DIAGNOSIS — I5042 Chronic combined systolic (congestive) and diastolic (congestive) heart failure: Secondary | ICD-10-CM

## 2017-10-19 DIAGNOSIS — I130 Hypertensive heart and chronic kidney disease with heart failure and stage 1 through stage 4 chronic kidney disease, or unspecified chronic kidney disease: Secondary | ICD-10-CM | POA: Diagnosis not present

## 2017-10-19 DIAGNOSIS — E785 Hyperlipidemia, unspecified: Secondary | ICD-10-CM | POA: Insufficient documentation

## 2017-10-19 DIAGNOSIS — Z7982 Long term (current) use of aspirin: Secondary | ICD-10-CM | POA: Diagnosis not present

## 2017-10-19 DIAGNOSIS — I2510 Atherosclerotic heart disease of native coronary artery without angina pectoris: Secondary | ICD-10-CM | POA: Insufficient documentation

## 2017-10-19 DIAGNOSIS — R0602 Shortness of breath: Secondary | ICD-10-CM | POA: Diagnosis present

## 2017-10-19 LAB — BASIC METABOLIC PANEL
Anion gap: 9 (ref 5–15)
BUN: 32 mg/dL — ABNORMAL HIGH (ref 6–20)
CALCIUM: 9.1 mg/dL (ref 8.9–10.3)
CO2: 31 mmol/L (ref 22–32)
Chloride: 105 mmol/L (ref 101–111)
Creatinine, Ser: 1.91 mg/dL — ABNORMAL HIGH (ref 0.61–1.24)
GFR calc non Af Amer: 36 mL/min — ABNORMAL LOW (ref >60)
GFR, EST AFRICAN AMERICAN: 42 mL/min — AB (ref >60)
GLUCOSE: 108 mg/dL — AB (ref 65–99)
Potassium: 3.9 mmol/L (ref 3.5–5.1)
Sodium: 145 mmol/L (ref 135–145)

## 2017-10-19 LAB — DIGOXIN LEVEL: Digoxin Level: 1.2 ng/mL (ref 0.8–2.0)

## 2017-10-19 MED ORDER — TORSEMIDE 20 MG PO TABS: 60.0000 mg | ORAL_TABLET | Freq: Every day | ORAL | 11 refills | Status: DC

## 2017-10-19 NOTE — Progress Notes (Signed)
PCP: Primary Cardiologist: [facility]   HPI: 63 yo with history of longstanding cardiomyopathy out of proportion to CAD/chronic systolic CHF (EF 78-93% 12/1015), prior VT, s/p St Jude ICD, CAD, CVA, OSA on CPAP, pulmonary HTN on O2 at home, PAF on coumadin, HTN, and CKD stage 3.  Admitted from Dr Hochrein's office 10/01/17 with volume overload. HF team was consulted. Underwent R/LHC 10/04/17 - full report below. PYP scan and myeloma panel completed and were not suggestive of cardiac amyloidosis. V/Q scan completed for elevated PA pressures on cath, which was negative. He diuresed 31 lbs with IV lasix and metolazone and transitioned to torsemide 60 mg daily. HF meds were optimized. No ACE/ARB with CKD and soft BPs. Discharge weight 269 pounds.  Today he returns for post hospital follow up. Overall feeling fine. SOB with steps. Denies PND/Orthopnea. Appetite ok. Tries to follow low salt diet.  No fever or chills. No bleeding problems. Weight at home  265-268 pounds. Taking all medications.   V/Q scan 10/07/17:No appreciable ventilation or perfusion defects.  PYP 10/05/17:Visual and quantitative assessment (grade 0/1, H/CLL equal 1.3) are equivocal for transthyretin amyloidosis.  LHC 10/04/17: Left Main  No significant disease.  Left Anterior Descending  Large, wrap-around LAD with 30% mid vessel stenosis.  Left Circumflex  50-60% distal LCx stenosis.  Right Coronary Artery  50% mid RCA stenosis.   [place] 10/04/17: RA mean 19 RV 72/23 PA 74/37, mean 51 PCWP mean 19 LV 111/28 AO 112/80 Oxygen saturations: PA 55% AO 95% Cardiac Output (Fick) 4.68  Cardiac Index (Fick) 1.9  PVR 6.8 WU CVP/PCWP 1 PAPi 1.95 1. Nonischemic cardiomyopathy. Coronary disease well out of proportion to degree of cardiomyopathy.  2. Elevated R>L heart filling pressures, suggestive of significant RV failure with high CVP/PCWP ratio and PAPi <2.  3. Low cardiac output.  4. Mixed pulmonary  venous/pulmonary arterial hypertension, ?due to hypoxemia from OSA or OHS/OSA.       ROS: All systems negative except as listed in HPI, PMH and Problem List.  SH:  Social History   Socioeconomic History  . Marital status: Married    Spouse name: Not on file  . Number of children: 2  . Years of education: Not on file  . Highest education level: Not on file  Occupational History  . Occupation: medically retired due to heart failure  Social Needs  . Financial resource strain: Not on file  . Food insecurity:    Worry: Not on file    Inability: Not on file  . Transportation needs:    Medical: Not on file    Non-medical: Not on file  Tobacco Use  . Smoking status: Former Smoker    Last attempt to quit: 05/04/1986    Years since quitting: 31.4  . Smokeless tobacco: Never Used  . Tobacco comment: Quit smoking 30 yrs ago. Smoked as teenager less than 1/2 ppd. Smoked x 4 years.  Substance and Sexual Activity  . Alcohol use: No    Comment: occasionally  . Drug use: No  . Sexual activity: Never  Lifestyle  . Physical activity:    Days per week: Not on file    Minutes per session: Not on file  . Stress: Not on file  Relationships  . Social connections:    Talks on phone: Not on file    Gets together: Not on file    Attends religious service: Not on file    Active member of club or organization: Not on  file    Attends meetings of clubs or organizations: Not on file    Relationship status: Not on file  . Intimate partner violence:    Fear of current or ex partner: Not on file    Emotionally abused: Not on file    Physically abused: Not on file    Forced sexual activity: Not on file  Other Topics Concern  . Not on file  Social History Narrative  . Not on file    FH:  Family History  Problem Relation Age of Onset  . Coronary artery disease Mother   . Heart attack Mother   . Heart attack Maternal Uncle   . Heart attack Maternal Grandmother     Past Medical History:   Diagnosis Date  . CAD (coronary artery disease)    a. Initial nonobst by cath 2009. b. Cath 01/2013 in setting of VT storm: obstructive distal Cx disease (small and terminates in the AV groove, unlikely to cause significant ischemia or electrical instability), nonobstructive RCA disease, EF 15-20%.   . Cerebrovascular accident [facility])    a. 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 Chronic systolic CHF (congestive heart failure) ([place])    a. Likely NICM (out of proportion to CAD). b. 2009 - EF 25-30% by echo, 01/2013: 15-20% by cath. c. 11/2014 Echo: EF 35-40%, Gr1 DD, mild MR, mod TR, PASP 13mmHg.  . CKD (chronic kidney disease), stage II   . Dyslipidemia   . Gout   . HTN (hypertension)   . Hypokalemia   . ICD (implantable cardiac defibrillator) in place   . Insulin dependent diabetes mellitus ([place])   . Lipoma   . Nonischemic cardiomyopathy ([place])    a.  11/2014 Echo: EF 35-40%, Gr1 DD, mild MR, mod TR, PASP 24mmHg.  . OSA (obstructive sleep apnea)    does not wear cpap  . PAF (paroxysmal atrial fibrillation) ([place])    a. Noted 05/2008 by EKG;  b. CHA2DS2VASc = 5-6-->coumadin.  . Paroxysmal VT ([place])    a. s/p St. Jude ICD 2007. b. H/o paroxysmal VT/VF including VT storm 12/2012 admission prompting amio initiation;  c. 01/2013 ICD upgrade SJM [id number] Ellipse VR single lead ICD.  [name] Pulmonary HTN ([place])    a. Mild by cath 01/2013.    Current Outpatient Medications  Medication Sig Dispense Refill  . acetaminophen (TYLENOL) 500 MG tablet Take 500-1,000 mg by mouth every 6 (six) hours as needed (for bilateral knee pain).    [name] allopurinol (ZYLOPRIM) 300 MG tablet TAKE 1 TABLET BY MOUTH  DAILY 90 tablet 1  . amiodarone (PACERONE) 200 MG tablet TAKE 1 TABLET BY MOUTH  DAILY 90 tablet 1  . aspirin EC 81 MG EC tablet Take 1 tablet (81 mg total) by mouth daily. 30 tablet 0  . atorvastatin (LIPITOR) 80 MG tablet TAKE 1 TABLET BY MOUTH AT  BEDTIME 90 tablet 1  . carvedilol (COREG) 25 MG tablet Take 0.5 tablets  (12.5 mg total) by mouth 2 (two) times daily with a meal. 60 tablet 0  . Cholecalciferol (VITAMIN D-3 PO) Take 1 capsule by mouth daily.    . digoxin (LANOXIN) 0.125 MG tablet Take 1 tablet (0.125 mg total) by mouth daily. 30 tablet 6  . enoxaparin (LOVENOX) 120 MG/0.8ML injection Inject 0.81 mLs (120 mg total) into the skin every 12 (twelve) hours for 4 days. 10 Syringe 0  . Insulin Glargine (LANTUS SOLOSTAR) 100 UNIT/ML Solostar Pen INJECT 10 UNITS INTO THE SKIN AT BEDTIME.  15 pen 2  . isosorbide-hydrALAZINE (BIDIL) 20-37.5 MG tablet Take 1 tablet by mouth 3 (three) times daily. 90 tablet 6  . nitroGLYCERIN (NITROSTAT) 0.4 MG SL tablet PLACE 1 TABLET UNDER TONGUE EVERY 5 MINS X3 DOSES AS NEEDED FOR CHEST PAIN 25 tablet 11  . potassium chloride SA (K-DUR,KLOR-CON) 20 MEQ tablet Take 1 tablet (20 mEq total) by mouth daily. 30 tablet 0  . spironolactone (ALDACTONE) 25 MG tablet Take 0.5 tablets (12.5 mg total) by mouth daily. 15 tablet 6  . torsemide (DEMADEX) 20 MG tablet Take 3 tablets (60 mg total) by mouth daily. 90 tablet 6  . traMADol (ULTRAM) 50 MG tablet Take 1 tablet (50 mg total) by mouth every 8 (eight) hours as needed. (Patient taking differently: Take 50 mg by mouth every 8 (eight) hours as needed (for pain). ) 60 tablet 2  . Turmeric 500 MG CAPS Take 1,000 mg by mouth daily as needed (inflammation).     . warfarin (COUMADIN) 5 MG tablet Take 5-10 mg by mouth See admin instructions. 10mg  on MON and FRI, 5mg  on all other days.     No current facility-administered medications for this encounter.     Vitals:   10/19/17 1116  BP: 94/72  Pulse: 85  SpO2: 90%  Weight: 272 lb (123.4 kg)   Filed Weights   10/19/17 1116  Weight: 272 lb (123.4 kg)   PHYSICAL EXAM: General:  Well appearing. No resp difficulty. Walked in the clinic without difficulty.  HEENT: normal Neck: supple. JVP flat. Carotids 2+ bilaterally; no bruits. No lymphadenopathy or thryomegaly appreciated. Cor: PMI  normal. Regular rate & rhythm. No rubs, gallops or murmurs. He does not have S3.  Lungs: clear Abdomen: soft, nontender, nondistended. No hepatosplenomegaly. No bruits or masses. Good bowel sounds. Extremities: no cyanosis, clubbing, rash, edema Neuro: alert & orientedx3, cranial nerves grossly intact. Moves all 4 extremities w/o difficulty. Affect pleasant.   ECG: SR 100 bpm    ASSESSMENT & PLAN: 1. Chronic Systolic HF due to NICM - St Jude ICD- No VT Impedance up. ECHO 07/28/2017 EF 20-25%. RV mildly dilated.  -NYHA IIIb. Voume status stable. Continue 60 mg torsemide daily and instructed to take an extra 20 mg for 3 pound weight gain in 24 hours.  - SBP soft. Continue carvedilol 12.5 mg twice a day.  - Continue digoxin 0.125 mg daily.  - Continue bidil 1 tab three times a day.  - Continue 12.5 mg spironolactone daily.  - Check BMET/BNP today   2. CKD stage 3 cratinine baseline 1.7-1.9  Check BMET today   3. CAD No s/s ischemia  Continue statin and bb.  - On coumadin.   4. PAF  - On coumadin, followed by coumadin clinic on [place]. Discharged with Lovenox for subtherapeutic INR with hx of CVA  6. Hx of VT No VTt on device interrogation.  7. Pulmonary HTN - Chronic O2   Follow up in 4 weeks. He may be candidate for [facility] Stem. Discussed with research to review. Consider CPX at next appointment.   Pierson Vantol NP-C  1:55 PM

## 2017-10-19 NOTE — Patient Instructions (Signed)
Routine lab work today. Will notify you of abnormal results, otherwise no news is good news!  Continue taking Torsemide 60 mg (3 tabs) once daily. Can also take one extra 20 mg tablet once daily AS NEEDED for weight gain 3 lbs or more in 24 hrs.  Follow up 4 weeks.  ________________________________________________________________ [name] Code: 4483  Take all medication as prescribed the day of your appointment. Bring all medications with you to your appointment.  Do the following things EVERYDAY: 1) Weigh yourself in the morning before breakfast. Write it down and keep it in a log. 2) Take your medicines as prescribed 3) Eat low salt foods-Limit salt (sodium) to 2000 mg per day.  4) Stay as active as you can everyday 5) Limit all fluids for the day to less than 2 liters

## 2017-10-22 ENCOUNTER — Telehealth (HOSPITAL_COMMUNITY): Payer: Self-pay | Admitting: *Deleted

## 2017-10-22 MED ORDER — DIGOXIN 125 MCG PO TABS: 0.1250 mg | ORAL_TABLET | ORAL | 6 refills | Status: DC

## 2017-10-22 NOTE — Telephone Encounter (Signed)
Result Notes for Digoxin level   Notes recorded by Darron Doom, RN on 10/22/2017 at 8:42 AM EDT Patient called back, he is aware and will start taking Digoxin every other day. MAR updated. ------  Notes recorded by Kerry Dory, CMA on 10/21/2017 at 4:09 PM EDT Left message for patient to call back.  [phone] (M) ------  Notes recorded by Conrad Meyersdale, NP on 10/19/2017 at 5:14 PM EDT Please call and instruct to take digoxin every other day. Dig level high. Renal function to stable.

## 2017-10-25 ENCOUNTER — Other Ambulatory Visit: Payer: Self-pay | Admitting: *Deleted

## 2017-10-25 NOTE — Patient Outreach (Signed)
[place] [facility]) Care Management  10/25/2017  [name] Oct 23, 1955 [id number]   EMMI-  RED ON EMMI ALERT Day # 16  Date: 10/24/17  Red Alert Reason: New/sorsening problems? Yes New swelling? Yes  Cm noted wet on day #14 ws 263 lbs and on day #16 ws 264 Lbs  Outreach attempt # 1 No answer. [facility] RN CM left HIPAA compliant voicemail message along with CM's contact info.  Plan: [facility] RN CM sent an unsuccessful outreach letter and scheduled this patient for another call attempt within 4 business days   Kimberly L. Lavina Hamman, RN, BSN, CCM [facility] Telephonic Care Management Care Coordinator Direct number [phone]  Main [facility] number [phone] Fax number [phone]

## 2017-10-25 NOTE — Patient Outreach (Signed)
[facility] [facility]) Care Management  10/25/2017  AVONDRE RICHENS Sep 03, 1955 [id number]   RED ON EMMI ALERT Day # 16  Date: 10/24/17  Red Alert Reason: New/sorsening problems? Yes New swelling? Yes  Cm noted wet on day #14 ws 263 lbs and on day #16 was 264 Lbs  Mr Gowdy returned a call to [place] Patient is able to verify HIPAA [place] Management RN reviewed and addressed red alert with patient Mr Gabler reports that the EMMI call recorded incorrect answers for  him on 10/24/17. He reports the answer should have been "No" for both questions.  He does not have any new/worsening problems nor any new swelling He reports his wt today as initially 261 lbs and with a re check was at 263 lbs.  He states he has had to contact [place] health care previously about his scale readings and will be contacting them again. He denies any medical concerns at this time and reports he had a follow up with primary MD, Cathlean Cower on 10/12/17  He as admitted recently on 10/01/17 and d/c on 10/07/17   Advised patient that there will be further automated EMMI- post discharge calls to assess how the patient is doing following the recent hospitalization Advised the patient that another call may be received from a nurse if any of their responses were abnormal. Patient voiced understanding and was appreciative of f/u call.  Plan: [facility] RN CM will close case at this time as patient has been assessed and no needs identified.    Kimberly L. Lavina Hamman, RN, BSN, CCM [facility] Telephonic Care Management Care Coordinator Direct number [phone]  Main [facility] number [phone] Fax number [phone]

## 2017-10-26 ENCOUNTER — Ambulatory Visit: Payer: Medicare Other | Admitting: *Deleted

## 2017-10-27 ENCOUNTER — Telehealth: Payer: Self-pay | Admitting: Internal Medicine

## 2017-10-27 ENCOUNTER — Other Ambulatory Visit (HOSPITAL_COMMUNITY): Payer: Self-pay | Admitting: *Deleted

## 2017-10-27 MED ORDER — ISOSORB DINITRATE-HYDRALAZINE 20-37.5 MG PO TABS: 1.0000 | ORAL_TABLET | Freq: Three times a day (TID) | ORAL | 3 refills | Status: DC

## 2017-10-27 MED ORDER — AMIODARONE HCL 200 MG PO TABS: 200.0000 mg | ORAL_TABLET | Freq: Every day | ORAL | 3 refills | Status: DC

## 2017-10-27 MED ORDER — ATORVASTATIN CALCIUM 80 MG PO TABS: 80.0000 mg | ORAL_TABLET | Freq: Every day | ORAL | 3 refills | Status: DC

## 2017-10-27 MED ORDER — DIGOXIN 125 MCG PO TABS: 0.1250 mg | ORAL_TABLET | ORAL | 3 refills | Status: DC

## 2017-10-27 MED ORDER — SPIRONOLACTONE 25 MG PO TABS: 12.5000 mg | ORAL_TABLET | Freq: Every day | ORAL | 3 refills | Status: DC

## 2017-10-27 MED ORDER — TORSEMIDE 20 MG PO TABS: 60.0000 mg | ORAL_TABLET | Freq: Every day | ORAL | 3 refills | Status: DC

## 2017-10-27 MED ORDER — CARVEDILOL 25 MG PO TABS: 12.5000 mg | ORAL_TABLET | Freq: Two times a day (BID) | ORAL | 3 refills | Status: DC

## 2017-10-27 MED ORDER — POTASSIUM CHLORIDE CRYS ER 20 MEQ PO TBCR: 20.0000 meq | EXTENDED_RELEASE_TABLET | Freq: Every day | ORAL | 3 refills | Status: DC

## 2017-10-27 NOTE — Telephone Encounter (Signed)
Copied from [place] [id number]. Topic: Quick Communication - See Telephone Encounter >> Oct 27, 2017  9:34 AM Ahmed Prima L wrote: CRM for notification. See Telephone encounter for: 10/27/17.  torsemide (DEMADEX) 20 MG tablet isosorbide-hydrALAZINE (BIDIL) 20-37.5 MG tablet digoxin (LANOXIN) 0.125 MG tablet  [facility]

## 2017-10-28 ENCOUNTER — Other Ambulatory Visit (HOSPITAL_COMMUNITY): Payer: Self-pay | Admitting: Cardiology

## 2017-10-28 ENCOUNTER — Other Ambulatory Visit: Payer: Self-pay | Admitting: *Deleted

## 2017-10-28 MED ORDER — DIGOXIN 125 MCG PO TABS: 0.1250 mg | ORAL_TABLET | ORAL | 3 refills | Status: DC

## 2017-10-28 MED ORDER — TORSEMIDE 20 MG PO TABS: 60.0000 mg | ORAL_TABLET | Freq: Every day | ORAL | 3 refills | Status: DC

## 2017-10-28 NOTE — Patient Outreach (Signed)
[place] [facility]) Care Management  10/28/2017  [name] Feb 21, 1956 [id number]   EMMI-  RED ON EMMI ALERT Day # 19 Date: 10/27/17 Red Alert Reason: new/worsening problems? Yes Wt noted at 266 lbs    Outreach attempt #1 No answer. [facility] RN CM left HIPAA compliant voicemail message along with CM's contact info. [name] called CM back a few minutes after the voice message was left  Patient is able to verify HIPAA [place] Management RN reviewed and addressed red alert with patient  [name] confirms with [facility] RN CM that the EMMI recorded response-  new/worsening problems? Yes" is incorrect  He states he responded "no" He denies any new/worsening problems CM encouraged [name] to pause and then answer the EMMI question to see if it picks up better responses He states he will attempt this and voiced appreciation for the call from [place] patient that there will be further automated EMMI- post discharge calls to assess how the patient is doing following the recent hospitalization Advised the patient that another call may be received from a nurse if any of their responses were abnormal. Patient voiced understanding and was appreciative of f/u call.  Plan: [facility] RN CM will close case at this time as patient has been assessed and no needs identified.    Ruth Kovich L. Lavina Hamman, RN, BSN, CCM [facility] Telephonic Care Management Care Coordinator Direct number [phone]  Main [facility] number [phone] Fax number [phone]

## 2017-10-29 ENCOUNTER — Telehealth (HOSPITAL_COMMUNITY): Payer: Self-pay

## 2017-10-29 NOTE — Telephone Encounter (Signed)
Called patient in regards to Cardiac Rehab - Scheduled orientation on 12/07/17 at 8:30am. Patient will attend the 11:15am exc class. Patient will only attend on Mondays and Wednesdays. Mailed packet.

## 2017-10-31 DIAGNOSIS — I5043 Acute on chronic combined systolic (congestive) and diastolic (congestive) heart failure: Secondary | ICD-10-CM | POA: Diagnosis not present

## 2017-11-08 NOTE — Progress Notes (Signed)
Advanced Heart Failure Clinic Note PCP: Primary Cardiologist: Dr. [facility]   HPI: [name] is a 62 y.o. male with history of longstanding cardiomyopathy out of proportion to CAD/chronic systolic CHF (EF 27-78% 06/4233), prior VT, s/p St Jude ICD, CAD, CVA, OSA on CPAP, pulmonary HTN on O2 at home, PAF on coumadin, HTN, and CKD stage 3.  Admitted from Dr Hochrein's office 10/01/17 with volume overload. HF team was consulted. Underwent R/LHC 10/04/17 - full report below. PYP scan and myeloma panel completed and were not suggestive of cardiac amyloidosis. V/Q scan completed for elevated PA pressures on cath, which was negative. He diuresed 31 lbs with IV lasix and metolazone and transitioned to torsemide 60 mg daily. HF meds were optimized. No ACE/ARB with CKD and soft BPs. Discharge weight 269 pounds.  He presents today for regular follow up. No change at last visit with soft pressures. Weight up 4 lbs from last visit. He continues to have mild SOB with steps. He has been having difficulty with his medications. He ran out of torsemide 2 days ago, so has been taking his leftover furosemide. He has refills ready. He has also been taking Bidils components as he had leftover from previous prescriptions.  Weight up 4 lbs from last visit, but in setting of meds as above. He denies PND or orthopnea.   V/Q scan 10/07/17:No appreciable ventilation or perfusion defects.  PYP 10/05/17:Visual and quantitative assessment (grade 0/1, H/CLL equal 1.3) are equivocal for transthyretin amyloidosis.  LHC 10/04/17: Left Main  No significant disease.  Left Anterior Descending  Large, wrap-around LAD with 30% mid vessel stenosis.  Left Circumflex  50-60% distal LCx stenosis.  Right Coronary Artery  50% mid RCA stenosis.   [place] 10/04/17: RA mean 19 RV 72/23 PA 74/37, mean 51 PCWP mean 19 LV 111/28 AO 112/80 Oxygen saturations: PA 55% AO 95% Cardiac Output (Fick) 4.68  Cardiac Index  (Fick) 1.9  PVR 6.8 WU CVP/PCWP 1 PAPi 1.95 1. Nonischemic cardiomyopathy. Coronary disease well out of proportion to degree of cardiomyopathy.  2. Elevated R>L heart filling pressures, suggestive of significant RV failure with high CVP/PCWP ratio and PAPi <2.  3. Low cardiac output.  4. Mixed pulmonary venous/pulmonary arterial hypertension, ?due to hypoxemia from OSA or OHS/OSA.   Review of systems complete and found to be negative unless listed in HPI.    SH:  Social History   Socioeconomic History  . Marital status: Married    Spouse name: Not on file  . Number of children: 2  . Years of education: Not on file  . Highest education level: Not on file  Occupational History  . Occupation: medically retired due to heart failure  Social Needs  . Financial resource strain: Not on file  . Food insecurity:    Worry: Not on file    Inability: Not on file  . Transportation needs:    Medical: Not on file    Non-medical: Not on file  Tobacco Use  . Smoking status: Former Smoker    Last attempt to quit: 05/04/1986    Years since quitting: 31.5  . Smokeless tobacco: Never Used  . Tobacco comment: Quit smoking 30 yrs ago. Smoked as teenager less than 1/2 ppd. Smoked x 4 years.  Substance and Sexual Activity  . Alcohol use: No    Comment: occasionally  . Drug use: No  . Sexual activity: Never  Lifestyle  . Physical activity:    Days per  week: Not on file    Minutes per session: Not on file  . Stress: Not on file  Relationships  . Social connections:    Talks on phone: Not on file    Gets together: Not on file    Attends religious service: Not on file    Active member of club or organization: Not on file    Attends meetings of clubs or organizations: Not on file    Relationship status: Not on file  . Intimate partner violence:    Fear of current or ex partner: Not on file    Emotionally abused: Not on file    Physically abused: Not on file    Forced sexual activity: Not  on file  Other Topics Concern  . Not on file  Social History Narrative  . Not on file    FH:  Family History  Problem Relation Age of Onset  . Coronary artery disease Mother   . Heart attack Mother   . Heart attack Maternal Uncle   . Heart attack Maternal Grandmother     Past Medical History:  Diagnosis Date  . CAD (coronary artery disease)    a. Initial nonobst by cath 2009. b. Cath 01/2013 in setting of VT storm: obstructive distal Cx disease (small and terminates in the AV groove, unlikely to cause significant ischemia or electrical instability), nonobstructive RCA disease, EF 15-20%.   . Cerebrovascular accident [facility])    a. 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 Chronic systolic CHF (congestive heart failure) ([place])    a. Likely NICM (out of proportion to CAD). b. 2009 - EF 25-30% by echo, 01/2013: 15-20% by cath. c. 11/2014 Echo: EF 35-40%, Gr1 DD, mild MR, mod TR, PASP 70mmHg.  . CKD (chronic kidney disease), stage II   . Dyslipidemia   . Gout   . HTN (hypertension)   . Hypokalemia   . ICD (implantable cardiac defibrillator) in place   . Insulin dependent diabetes mellitus ([place])   . Lipoma   . Nonischemic cardiomyopathy ([place])    a.  11/2014 Echo: EF 35-40%, Gr1 DD, mild MR, mod TR, PASP 35mmHg.  . OSA (obstructive sleep apnea)    does not wear cpap  . PAF (paroxysmal atrial fibrillation) ([place])    a. Noted 05/2008 by EKG;  b. CHA2DS2VASc = 5-6-->coumadin.  . Paroxysmal VT ([place])    a. s/p St. Jude ICD 2007. b. H/o paroxysmal VT/VF including VT storm 12/2012 admission prompting amio initiation;  c. 01/2013 ICD upgrade SJM 1411-36Q Ellipse VR single lead ICD.  [name] Pulmonary HTN ([place])    a. Mild by cath 01/2013.    Current Outpatient Medications  Medication Sig Dispense Refill  . acetaminophen (TYLENOL) 500 MG tablet Take 500-1,000 mg by mouth every 6 (six) hours as needed (for bilateral knee pain).    [name] allopurinol (ZYLOPRIM) 300 MG tablet TAKE 1 TABLET BY MOUTH  DAILY 90 tablet 1   . amiodarone (PACERONE) 200 MG tablet Take 1 tablet (200 mg total) by mouth daily. 90 tablet 3  . aspirin EC 81 MG EC tablet Take 1 tablet (81 mg total) by mouth daily. 30 tablet 0  . atorvastatin (LIPITOR) 80 MG tablet Take 1 tablet (80 mg total) by mouth at bedtime. 90 tablet 3  . carvedilol (COREG) 25 MG tablet Take 0.5 tablets (12.5 mg total) by mouth 2 (two) times daily with a meal. 90 tablet 3  . Cholecalciferol (VITAMIN D-3 PO) Take 1 capsule by mouth  daily.    . digoxin (LANOXIN) 0.125 MG tablet Take 1 tablet (0.125 mg total) by mouth every other day. 45 tablet 3  . Insulin Glargine (LANTUS SOLOSTAR) 100 UNIT/ML Solostar Pen INJECT 10 UNITS INTO THE SKIN AT BEDTIME. 15 pen 2  . nitroGLYCERIN (NITROSTAT) 0.4 MG SL tablet PLACE 1 TABLET UNDER TONGUE EVERY 5 MINS X3 DOSES AS NEEDED FOR CHEST PAIN 25 tablet 11  . potassium chloride SA (K-DUR,KLOR-CON) 20 MEQ tablet Take 1 tablet (20 mEq total) by mouth daily. 90 tablet 3  . spironolactone (ALDACTONE) 25 MG tablet Take 0.5 tablets (12.5 mg total) by mouth daily. 45 tablet 3  . torsemide (DEMADEX) 20 MG tablet Take 3 tablets (60 mg total) by mouth daily. Take extra 20 mg tablet once daily AS NEEDED for weight gain 3 lbs or more in 24 hrs. 360 tablet 3  . traMADol (ULTRAM) 50 MG tablet Take 1 tablet (50 mg total) by mouth every 8 (eight) hours as needed. (Patient taking differently: Take 50 mg by mouth every 8 (eight) hours as needed (for pain). ) 60 tablet 2  . Turmeric 500 MG CAPS Take 1,000 mg by mouth daily as needed (inflammation).     . warfarin (COUMADIN) 5 MG tablet Take 5-10 mg by mouth See admin instructions. 10mg  on MON and FRI, 5mg  on all other days.    . isosorbide-hydrALAZINE (BIDIL) 20-37.5 MG tablet Take 1 tablet by mouth 3 (three) times daily. 270 tablet 3   No current facility-administered medications for this encounter.    Vitals:   11/09/17 1136  BP: 132/90  Pulse: 80  SpO2: 93%  Weight: 276 lb 9.6 oz (125.5 kg)   Wt  Readings from Last 3 Encounters:  11/09/17 276 lb 9.6 oz (125.5 kg)  10/19/17 272 lb (123.4 kg)  10/07/17 269 lb 8 oz (122.2 kg)    PHYSICAL EXAM: General: Well appearing. No resp difficulty. HEENT: Normal Neck: Supple. JVP 5-6. Carotids 2+ bilat; no bruits. No thyromegaly or nodule noted. Cor: PMI nondisplaced. RRR, No M/G/R noted Lungs: CTAB, normal effort. Abdomen: Soft, non-tender, non-distended, no HSM. No bruits or masses. +BS  Extremities: No cyanosis, clubbing, or rash. R and LLE no edema.  Neuro: Alert & orientedx3, cranial nerves grossly intact. moves all 4 extremities w/o difficulty. Affect pleasant   ASSESSMENT & PLAN: 1. Chronic Systolic HF due to NICM - St Jude ICD  - ECHO 07/28/2017 EF 20-25%. RV mildly dilated.  - NYHA II-III symptoms - Volume status mildly elevated on exam - Resume 60 mg torsemide daily and instructed to take an extra 20 mg for 3 pound weight gain in 24 hours.  - Continue carvedilol 12.5 mg twice a day.  - Continue digoxin 0.125 mg daily.  - Resume bidil 1 tab three times a day. Educated on difference of component dosages with Bidil.  - Continue 12.5 mg spironolactone daily.  - Reinforced fluid restriction to < 2 L daily, sodium restriction to less than 2000 mg daily, and the importance of daily weights.    2. CKD stage 3 - Baseline baseline 1.7-1.9  - BMET today.   3. CAD - No s/s of ischemia.    - Continue statin and bb.  - On coumadin.   4. PAF  - On coumadin, followed by coumadin clinic on [place].  - No change to current plan.    6. Hx of VT - None on device interrogation.   7. Pulmonary HTN - Continue chronic O2  Meds as above. He refuses med adjustment/changes today while awaiting pharmacy to sort out his prescriptions. He states he is getting his meds today or tomorrow, and if he has difficulty he will call us.  Labs today. RTC 6 weeks.   Shirley Friar, PA-C  3:54 PM  Greater than 50% of the 25 minute visit was spent  in counseling/coordination of care regarding disease state education, salt/fluid restriction, sliding scale diuretics, and medication compliance.

## 2017-11-09 ENCOUNTER — Ambulatory Visit (INDEPENDENT_AMBULATORY_CARE_PROVIDER_SITE_OTHER): Payer: Medicare Other | Admitting: General Practice

## 2017-11-09 ENCOUNTER — Ambulatory Visit (INDEPENDENT_AMBULATORY_CARE_PROVIDER_SITE_OTHER): Payer: Medicare Other | Admitting: *Deleted

## 2017-11-09 ENCOUNTER — Encounter (HOSPITAL_COMMUNITY): Payer: Self-pay

## 2017-11-09 ENCOUNTER — Ambulatory Visit (HOSPITAL_COMMUNITY)
Admission: RE | Admit: 2017-11-09 | Discharge: 2017-11-09 | Disposition: A | Discharge status: Home / Self Care | Payer: Medicare Other | Source: Ambulatory Visit | Attending: Cardiology | Admitting: Cardiology

## 2017-11-09 VITALS — BP 132/90 | HR 80 | Wt 276.6 lb

## 2017-11-09 DIAGNOSIS — Z9981 Dependence on supplemental oxygen: Secondary | ICD-10-CM | POA: Insufficient documentation

## 2017-11-09 DIAGNOSIS — I10 Essential (primary) hypertension: Secondary | ICD-10-CM

## 2017-11-09 DIAGNOSIS — E1122 Type 2 diabetes mellitus with diabetic chronic kidney disease: Secondary | ICD-10-CM | POA: Diagnosis not present

## 2017-11-09 DIAGNOSIS — I5042 Chronic combined systolic (congestive) and diastolic (congestive) heart failure: Secondary | ICD-10-CM

## 2017-11-09 DIAGNOSIS — E785 Hyperlipidemia, unspecified: Secondary | ICD-10-CM | POA: Insufficient documentation

## 2017-11-09 DIAGNOSIS — I428 Other cardiomyopathies: Secondary | ICD-10-CM

## 2017-11-09 DIAGNOSIS — I130 Hypertensive heart and chronic kidney disease with heart failure and stage 1 through stage 4 chronic kidney disease, or unspecified chronic kidney disease: Secondary | ICD-10-CM | POA: Insufficient documentation

## 2017-11-09 DIAGNOSIS — I480 Paroxysmal atrial fibrillation: Secondary | ICD-10-CM | POA: Diagnosis not present

## 2017-11-09 DIAGNOSIS — Z7901 Long term (current) use of anticoagulants: Secondary | ICD-10-CM | POA: Insufficient documentation

## 2017-11-09 DIAGNOSIS — Z95810 Presence of automatic (implantable) cardiac defibrillator: Secondary | ICD-10-CM | POA: Diagnosis not present

## 2017-11-09 DIAGNOSIS — G4733 Obstructive sleep apnea (adult) (pediatric): Secondary | ICD-10-CM | POA: Diagnosis not present

## 2017-11-09 DIAGNOSIS — Z87891 Personal history of nicotine dependence: Secondary | ICD-10-CM | POA: Insufficient documentation

## 2017-11-09 DIAGNOSIS — Z794 Long term (current) use of insulin: Secondary | ICD-10-CM | POA: Insufficient documentation

## 2017-11-09 DIAGNOSIS — Z8249 Family history of ischemic heart disease and other diseases of the circulatory system: Secondary | ICD-10-CM | POA: Diagnosis not present

## 2017-11-09 DIAGNOSIS — I429 Cardiomyopathy, unspecified: Secondary | ICD-10-CM | POA: Insufficient documentation

## 2017-11-09 DIAGNOSIS — I4891 Unspecified atrial fibrillation: Secondary | ICD-10-CM

## 2017-11-09 DIAGNOSIS — Z79899 Other long term (current) drug therapy: Secondary | ICD-10-CM | POA: Insufficient documentation

## 2017-11-09 DIAGNOSIS — Z7982 Long term (current) use of aspirin: Secondary | ICD-10-CM | POA: Diagnosis not present

## 2017-11-09 DIAGNOSIS — Z8673 Personal history of transient ischemic attack (TIA), and cerebral infarction without residual deficits: Secondary | ICD-10-CM | POA: Diagnosis not present

## 2017-11-09 DIAGNOSIS — I2510 Atherosclerotic heart disease of native coronary artery without angina pectoris: Secondary | ICD-10-CM

## 2017-11-09 DIAGNOSIS — I5022 Chronic systolic (congestive) heart failure: Secondary | ICD-10-CM | POA: Diagnosis not present

## 2017-11-09 DIAGNOSIS — N183 Chronic kidney disease, stage 3 unspecified: Secondary | ICD-10-CM

## 2017-11-09 DIAGNOSIS — Z8679 Personal history of other diseases of the circulatory system: Secondary | ICD-10-CM

## 2017-11-09 LAB — BASIC METABOLIC PANEL
ANION GAP: 9 (ref 5–15)
BUN: 23 mg/dL (ref 8–23)
CALCIUM: 8.8 mg/dL — AB (ref 8.9–10.3)
CO2: 28 mmol/L (ref 22–32)
Chloride: 107 mmol/L (ref 98–111)
Creatinine, Ser: 1.94 mg/dL — ABNORMAL HIGH (ref 0.61–1.24)
GFR calc non Af Amer: 36 mL/min — ABNORMAL LOW (ref >60)
GFR, EST AFRICAN AMERICAN: 41 mL/min — AB (ref >60)
GLUCOSE: 94 mg/dL (ref 70–99)
Potassium: 3.7 mmol/L (ref 3.5–5.1)
Sodium: 144 mmol/L (ref 135–145)

## 2017-11-09 LAB — POCT INR: INR: 1.7 — AB (ref 2.0–3.0)

## 2017-11-09 NOTE — Patient Instructions (Signed)
Routine lab work today. Will notify you of abnormal results, otherwise no news is good news!  Follow up 6 weeks.  __________________________________________________________________ [name] Code: 1500  Take all medication as prescribed the day of your appointment. Bring all medications with you to your appointment.  Do the following things EVERYDAY: 1) Weigh yourself in the morning before breakfast. Write it down and keep it in a log. 2) Take your medicines as prescribed 3) Eat low salt foods-Limit salt (sodium) to 2000 mg per day.  4) Stay as active as you can everyday 5) Limit all fluids for the day to less than 2 liters

## 2017-11-09 NOTE — Patient Instructions (Addendum)
Pre visit review using our clinic review tool, if applicable. No additional management support is needed unless otherwise documented below in the visit note.  Take 1 1/2 tablets today (7/9) and then take 1 tablet on Sunday/Tuesday/Thursday and Saturday and take 2 tablets on Monday and Fridays and take 1 1/2 tablets on Wednesdays.  Re-check in 4 weeks.

## 2017-11-10 NOTE — Progress Notes (Signed)
Remote ICD transmission.   

## 2017-11-12 ENCOUNTER — Other Ambulatory Visit: Payer: Self-pay

## 2017-11-12 MED ORDER — PEN NEEDLES 31G X 5 MM MISC: 100.0000 | Freq: Two times a day (BID) | 5 refills | Status: DC

## 2017-11-15 NOTE — Telephone Encounter (Signed)
Spoke with patient during HF visit

## 2017-11-19 ENCOUNTER — Other Ambulatory Visit (HOSPITAL_COMMUNITY): Payer: Self-pay | Admitting: *Deleted

## 2017-11-19 MED ORDER — TORSEMIDE 20 MG PO TABS: 60.0000 mg | ORAL_TABLET | Freq: Every day | ORAL | 3 refills | Status: DC

## 2017-11-23 LAB — CUP PACEART REMOTE DEVICE CHECK
Battery Remaining Longevity: 70 mo
HighPow Impedance: 47 Ohm
HighPow Impedance: 47 Ohm
Implantable Lead Location: 753860
Implantable Lead Model: 7001
Implantable Pulse Generator Implant Date: 20150914
Lead Channel Setting Pacing Amplitude: 2.5 V
Lead Channel Setting Pacing Pulse Width: 0.7 ms
Lead Channel Setting Sensing Sensitivity: 0.5 mV
MDC IDC LEAD IMPLANT DT: 20070817
MDC IDC MSMT BATTERY REMAINING PERCENTAGE: 69 %
MDC IDC MSMT BATTERY VOLTAGE: 2.98 V
MDC IDC MSMT LEADCHNL RV IMPEDANCE VALUE: 400 Ohm
MDC IDC MSMT LEADCHNL RV PACING THRESHOLD AMPLITUDE: 1 V
MDC IDC MSMT LEADCHNL RV PACING THRESHOLD PULSEWIDTH: 0.7 ms
MDC IDC MSMT LEADCHNL RV SENSING INTR AMPL: 12 mV
MDC IDC PG SERIAL: 7214340
MDC IDC SESS DTM: 20190709153743
MDC IDC STAT BRADY RV PERCENT PACED: 1 %

## 2017-11-30 DIAGNOSIS — I5043 Acute on chronic combined systolic (congestive) and diastolic (congestive) heart failure: Secondary | ICD-10-CM | POA: Diagnosis not present

## 2017-12-04 ENCOUNTER — Telehealth (HOSPITAL_COMMUNITY): Payer: Self-pay | Admitting: Pharmacy Technician

## 2017-12-04 NOTE — Telephone Encounter (Signed)
Cardiac Rehab Medication Review by a Pharmacist   Does the patient  feel that his/her medications are working for him/her?  yes  Has the patient been experiencing any side effects to the medications prescribed?  Yes- intermittent drowsiness  Does the patient measure his/her own blood pressure or blood glucose at home?  no   Does the patient have any problems obtaining medications due to transportation or finances?   no  Understanding of regimen: good Understanding of indications: good Potential of compliance: good  Pharmacist comments: Patient reports feeling drowsy at times, but he is not sure what medication is causing it. I recommended the patient to contact his PCP or cardiologist to ensure his medication regimen is correct (as an example, patient reports taking carvedilol three times a day)   [name], [facility]

## 2017-12-06 ENCOUNTER — Encounter (HOSPITAL_COMMUNITY): Payer: Self-pay

## 2017-12-07 ENCOUNTER — Ambulatory Visit (INDEPENDENT_AMBULATORY_CARE_PROVIDER_SITE_OTHER): Payer: Medicare Other | Admitting: General Practice

## 2017-12-07 ENCOUNTER — Encounter (HOSPITAL_COMMUNITY): Payer: Self-pay

## 2017-12-07 ENCOUNTER — Telehealth (HOSPITAL_COMMUNITY): Payer: Self-pay | Admitting: *Deleted

## 2017-12-07 ENCOUNTER — Encounter (HOSPITAL_COMMUNITY)
Admission: RE | Admit: 2017-12-07 | Discharge: 2017-12-07 | Disposition: A | Discharge status: Home / Self Care | Payer: Medicare Other | Source: Ambulatory Visit | Attending: Cardiology | Admitting: Cardiology

## 2017-12-07 VITALS — BP 104/72 | HR 67 | Ht 70.25 in | Wt 272.7 lb

## 2017-12-07 DIAGNOSIS — Z8679 Personal history of other diseases of the circulatory system: Secondary | ICD-10-CM

## 2017-12-07 DIAGNOSIS — Z7901 Long term (current) use of anticoagulants: Secondary | ICD-10-CM | POA: Diagnosis not present

## 2017-12-07 DIAGNOSIS — E119 Type 2 diabetes mellitus without complications: Secondary | ICD-10-CM | POA: Diagnosis not present

## 2017-12-07 DIAGNOSIS — I5022 Chronic systolic (congestive) heart failure: Secondary | ICD-10-CM | POA: Diagnosis not present

## 2017-12-07 DIAGNOSIS — I4891 Unspecified atrial fibrillation: Secondary | ICD-10-CM

## 2017-12-07 LAB — POCT INR: INR: 2.9 (ref 2.0–3.0)

## 2017-12-07 NOTE — Progress Notes (Signed)
[name] 62 y.o. male DOB: 06-30-1955 MRN: [id number]      Nutrition Note  1. 5643 Chronic systolic congestive heart failure [facility])    Past Medical History:  Diagnosis Date  . CAD (coronary artery disease)    a. Initial nonobst by cath 2009. b. Cath 01/2013 in setting of VT storm: obstructive distal Cx disease (small and terminates in the AV groove, unlikely to cause significant ischemia or electrical instability), nonobstructive RCA disease, EF 15-20%.   . Cerebrovascular accident [facility])    a. 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 Chronic systolic CHF (congestive heart failure) ([place])    a. Likely NICM (out of proportion to CAD). b. 2009 - EF 25-30% by echo, 01/2013: 15-20% by cath. c. 11/2014 Echo: EF 35-40%, Gr1 DD, mild MR, mod TR, PASP 68mmHg.  . CKD (chronic kidney disease), stage II   . Dyslipidemia   . Gout   . HTN (hypertension)   . Hypokalemia   . ICD (implantable cardiac defibrillator) in place   . Insulin dependent diabetes mellitus ([place])   . Lipoma   . Nonischemic cardiomyopathy ([place])    a.  11/2014 Echo: EF 35-40%, Gr1 DD, mild MR, mod TR, PASP 65mmHg.  . OSA (obstructive sleep apnea)    does not wear cpap  . PAF (paroxysmal atrial fibrillation) ([place])    a. Noted 05/2008 by EKG;  b. CHA2DS2VASc = 5-6-->coumadin.  . Paroxysmal VT ([place])    a. s/p St. Jude ICD 2007. b. H/o paroxysmal VT/VF including VT storm 12/2012 admission prompting amio initiation;  c. 01/2013 ICD upgrade SJM 1411-36Q Ellipse VR single lead ICD.  [name] Pulmonary HTN ([place])    a. Mild by cath 01/2013.   Meds reviewed. Atorvastatin, coreg, vit D, lantus, toresemide, warfarin noted  HT: Ht Readings from Last 1 Encounters:  12/07/17 5' 10.25" (1.784 m)    WT: Wt Readings from Last 5 Encounters:  12/07/17 272 lb 11.3 oz (123.7 kg)  11/09/17 276 lb 9.6 oz (125.5 kg)  10/19/17 272 lb (123.4 kg)  10/07/17 269 lb 8 oz (122.2 kg)  10/01/17 295 lb 9.6 oz (134.1 kg)     Body mass index is 38.85  kg/m.   Current tobacco use? no       Labs:  Lipid Panel     Component Value Date/Time   CHOL 114 10/02/2017 0720   TRIG 55 10/02/2017 0720   HDL 24 (L) 10/02/2017 0720   CHOLHDL 4.8 10/02/2017 0720   VLDL 11 10/02/2017 0720   LDLCALC 79 10/02/2017 0720   LDLDIRECT 194.6 05/09/2007 1006    Lab Results  Component Value Date   HGBA1C 5.6 08/04/2017   CBG (last 3)  No results for input(s): GLUCAP in the last 72 hours.  Nutrition Note Spoke with pt. Nutrition plan and goals reviewed with pt. Pt did not complete MEDFICTS survey, will return first day of cardiac rehab. Pt wants to lose wt. Pt has not actively been trying to lose wt. Wt loss tips reviewed.Pt is diabetic. Last A1c indicates blood glucose well-controlled. This Probation officer went over Diabetes Education test results. Pt checks CBG's 1-2 times a day. Fasting CBG's reportedly 90-110 mg/dL. Pt shared that his goal in cardiac rehab is to learn how to blend the heart health diet with a diabetic one. Recommended following a consistent carbohydrate heart healthy meal plan, and discussed what this looks like day to day with patient. Pt with dx of CHF. Per discussion, pt does not use  canned/convenience foods often. Pt rarely adds salt to food. Pt eats out infrequently. Pt shared he does the majority of the cooking for his house hold, and feels confident in his ability to make changes. Pt shared his family is supportive of any changes that need to be made. Pt expressed understanding of the information reviewed. Pt aware of nutrition education classes offered and plans on attending nutrition classes.  Nutrition Diagnosis ? Food-and nutrition-related knowledge deficit related to lack of exposure to information as related to diagnosis of: ? CVD ? DM  ? Obesity related to excessive energy intake as evidenced by a Body mass index is 38.85 kg/m.  Nutrition Intervention ? Pt's individual nutrition plan and goals reviewed with pt. ? Pt given  handouts for: ? Nutrition I class ? Nutrition II class  ? Diabetes Blitz Class ? Diabetes Q & A class  ? Consistent vit K diet ? low sodium ? DM ? pre-diabetes  Nutrition Goal(s):   ? Pt to identify and limit food sources of saturated fat, trans fat, and sodium ? Pt to identify food quantities necessary to achieve weight loss of 6-24 lbs. at graduation from cardiac rehab.    Plan:  ? Pt to attend nutrition classes ? Nutrition I ? Nutrition II ? Portion Distortion ? Diabetes Blitz ? Diabetes Q & Ae determined ? Will provide client-centered nutrition education as part of interdisciplinary care ? Monitor and evaluate progress toward nutrition goal with team.   [name], MS, RD, LDN 12/07/2017 12:12 PM

## 2017-12-07 NOTE — Telephone Encounter (Signed)
-----   Message from Larey Dresser, MD sent at 12/07/2017 12:34 PM EDT ----- Regarding: RE: Ok to titrate oxygen therapy at cardiac rehab Yes that would be fine.  ----- Message ----- From: Rowe Pavy, RN Sent: 12/06/2017   4:43 PM To: Larey Dresser, MD Subject: Ok to titrate oxygen therapy at cardiac rehab  Dr. Aundra Dubin,  The above pt is scheduled for cardiac rehab orientation on tomorrow. Noted in his history, use of oxygen therapy due to pulmonary hypertension.   We will monitor pt o2 saturation. What is an acceptable o2 saturation for pt to maintain?  Ok to titrate oxygen therapy during exercise?  Thanks for your input Maurice Small RN, BSN Cardiac and Pulmonary Rehab Nurse Navigator

## 2017-12-07 NOTE — Progress Notes (Addendum)
Cardiac Individual Treatment Plan  Patient Details  Name: [name] MRN: [id number] Date of Birth: [birth year] Referring Provider:     [place] from 12/07/2017 in [facility]  Referring Provider  Minus Breeding, MD      Initial Encounter Date:    CARDIAC REHAB PHASE II ORIENTATION from 12/07/2017 in [place]  Date  12/07/17      Visit Diagnosis: 0300 Chronic systolic congestive heart failure ([place])  Patient's Home Medications on Admission:  Current Outpatient Medications:  .  acetaminophen (TYLENOL) 500 MG tablet, Take 500-1,000 mg by mouth every 6 (six) hours as needed (for bilateral knee pain)., Disp: , Rfl:  .  allopurinol (ZYLOPRIM) 300 MG tablet, TAKE 1 TABLET BY MOUTH  DAILY, Disp: 90 tablet, Rfl: 1 .  amiodarone (PACERONE) 200 MG tablet, Take 1 tablet (200 mg total) by mouth daily., Disp: 90 tablet, Rfl: 3 .  aspirin EC 81 MG EC tablet, Take 1 tablet (81 mg total) by mouth daily., Disp: 30 tablet, Rfl: 0 .  atorvastatin (LIPITOR) 80 MG tablet, Take 1 tablet (80 mg total) by mouth at bedtime., Disp: 90 tablet, Rfl: 3 .  carvedilol (COREG) 25 MG tablet, Take 0.5 tablets (12.5 mg total) by mouth 2 (two) times daily with a meal. (Patient taking differently: Take 12.5 mg by mouth 3 (three) times daily. ), Disp: 90 tablet, Rfl: 3 .  cholecalciferol (VITAMIN D) 1000 units tablet, Take 1,000 Units by mouth daily., Disp: , Rfl:  .  digoxin (LANOXIN) 0.125 MG tablet, Take 1 tablet (0.125 mg total) by mouth every other day., Disp: 45 tablet, Rfl: 3 .  Insulin Glargine (LANTUS SOLOSTAR) 100 UNIT/ML Solostar Pen, INJECT 10 UNITS INTO THE SKIN AT BEDTIME., Disp: 15 pen, Rfl: 2 .  Insulin Pen Needle (PEN NEEDLES) 31G X 5 MM MISC, 100 each by Does not apply route 2 (two) times daily., Disp: 100 each, Rfl: 5 .  isosorbide-hydrALAZINE (BIDIL) 20-37.5 MG tablet, Take 1 tablet by mouth 3 (three) times  daily., Disp: 270 tablet, Rfl: 3 .  nitroGLYCERIN (NITROSTAT) 0.4 MG SL tablet, PLACE 1 TABLET UNDER TONGUE EVERY 5 MINS X3 DOSES AS NEEDED FOR CHEST PAIN, Disp: 25 tablet, Rfl: 11 .  potassium chloride SA (K-DUR,KLOR-CON) 20 MEQ tablet, Take 1 tablet (20 mEq total) by mouth daily., Disp: 90 tablet, Rfl: 3 .  spironolactone (ALDACTONE) 25 MG tablet, Take 0.5 tablets (12.5 mg total) by mouth daily., Disp: 45 tablet, Rfl: 3 .  torsemide (DEMADEX) 20 MG tablet, Take 3 tablets (60 mg total) by mouth daily. Take extra 20 mg tablet once daily AS NEEDED for weight gain 3 lbs or more in 24 hrs. (Patient taking differently: Take 20 mg by mouth 3 (three) times daily. Take extra 20 mg tablet once daily AS NEEDED for weight gain 3 lbs or more in 24 hrs.), Disp: 360 tablet, Rfl: 3 .  Turmeric 500 MG CAPS, Take 1,000 mg by mouth daily as needed (inflammation). , Disp: , Rfl:  .  warfarin (COUMADIN) 5 MG tablet, Take 5-10 mg by mouth See admin instructions. Take 5mg  every day except take 10mg  Mondays and Fridays and take 7.5mg  Wednesdays, Disp: , Rfl:  .  traMADol (ULTRAM) 50 MG tablet, Take 1 tablet (50 mg total) by mouth every 8 (eight) hours as needed. (Patient not taking: Reported on 12/04/2017), Disp: 60 tablet, Rfl: 2  Past Medical History: Past Medical History:  Diagnosis  Date  . CAD (coronary artery disease)    a. Initial nonobst by cath 2009. b. Cath 01/2013 in setting of VT storm: obstructive distal Cx disease (small and terminates in the AV groove, unlikely to cause significant ischemia or electrical instability), nonobstructive RCA disease, EF 15-20%.   . Cerebrovascular accident [facility])    a. 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 Chronic systolic CHF (congestive heart failure) ([place])    a. Likely NICM (out of proportion to CAD). b. 2009 - EF 25-30% by echo, 01/2013: 15-20% by cath. c. 11/2014 Echo: EF 35-40%, Gr1 DD, mild MR, mod TR, PASP 79mmHg.  . CKD (chronic kidney disease), stage II   . Dyslipidemia    . Gout   . HTN (hypertension)   . Hypokalemia   . ICD (implantable cardiac defibrillator) in place   . Insulin dependent diabetes mellitus ([place])   . Lipoma   . Nonischemic cardiomyopathy ([place])    a.  11/2014 Echo: EF 35-40%, Gr1 DD, mild MR, mod TR, PASP 65mmHg.  . OSA (obstructive sleep apnea)    does not wear cpap  . PAF (paroxysmal atrial fibrillation) ([place])    a. Noted 05/2008 by EKG;  b. CHA2DS2VASc = 5-6-->coumadin.  . Paroxysmal VT ([place])    a. s/p St. Jude ICD 2007. b. H/o paroxysmal VT/VF including VT storm 12/2012 admission prompting amio initiation;  c. 01/2013 ICD upgrade SJM [id number] Ellipse VR single lead ICD.  [name] Pulmonary HTN ([place])    a. Mild by cath 01/2013.    Tobacco Use: Social History   Tobacco Use  Smoking Status Former Smoker  . Last attempt to quit: 05/04/1986  . Years since quitting: 31.6  Smokeless Tobacco Never Used  Tobacco Comment   Quit smoking 30 yrs ago. Smoked as teenager less than 1/2 ppd. Smoked x 4 years.    Labs: Recent Review Flowsheet Data    Labs for ITP Cardiac and Pulmonary Rehab Latest Ref Rng & Units 01/19/2017 08/04/2017 10/02/2017 10/04/2017 10/04/2017   Cholestrol 0 - 200 mg/dL 141 - 114 - -   LDLCALC 0 - 99 mg/dL 87 - 79 - -   LDLDIRECT mg/dL - - - - -   HDL >40 mg/dL 41.10 - 24(L) - -   Trlycerides <150 mg/dL 69.0 - 55 - -   Hemoglobin A1c - 5.7 5.6 - - -   PHART 7.350 - 7.450 - - - - -   PCO2ART 35.0 - 45.0 mmHg - - - - -   HCO3 20.0 - 28.0 mmol/L - - - 36.7(H) 35.5(H)   TCO2 22 - 32 mmol/L - - - 39(H) 37(H)   ACIDBASEDEF 0.0 - 2.0 mmol/L - - - - -   O2SAT % - - - 55.0 55.0      Capillary Blood Glucose: Lab Results  Component Value Date   GLUCAP 155 (H) 10/07/2017   GLUCAP 101 (H) 10/07/2017   GLUCAP 105 (H) 10/06/2017   GLUCAP 128 (H) 10/06/2017   GLUCAP 108 (H) 10/06/2017     Exercise Target Goals: Date: 12/07/17  Exercise Program Goal: Individual exercise prescription set using results from initial 6 min walk test and  THRR while considering  patient's activity barriers and safety.   Exercise Prescription Goal: Initial exercise prescription builds to 30-45 minutes a day of aerobic activity, 2-3 days per week.  Home exercise guidelines will be given to patient during program as part of exercise prescription that the participant will acknowledge.  Activity Barriers &  Risk Stratification: Activity Barriers & Cardiac Risk Stratification - 12/07/17 0917      Activity Barriers & Cardiac Risk Stratification   Activity Barriers  Other (comment);Arthritis    Comments  Slight MCL tear- lt knee; arthritis- lt knee; both knees weak.    Cardiac Risk Stratification  High       6 Minute Walk: 6 Minute Walk    Row Name 12/07/17 0845         6 Minute Walk   Phase  Initial     Distance  757 feet     Walk Time  6 minutes     # of Rest Breaks  0     MPH  1.43     METS  1.59     RPE  13     VO2 Peak  5.57     Symptoms  Yes (comment)     Comments  Patient c/o bilateral knee pain.     Resting HR  67 bpm     Resting BP  104/72     Resting Oxygen Saturation   94 % 2L/min O2     Exercise Oxygen Saturation  during 6 min walk  92 % 2L/min O2     Max Ex. HR  100 bpm     Max Ex. BP  106/62     2 Minute Post BP  102/60        Oxygen Initial Assessment:   Oxygen Re-Evaluation:   Oxygen Discharge (Final Oxygen Re-Evaluation):   Initial Exercise Prescription: Initial Exercise Prescription - 12/07/17 1100      Date of Initial Exercise RX and Referring Provider   Date  12/07/17    Referring Provider  Minus Breeding, MD      Oxygen   Oxygen  Continuous    Liters  2      Recumbant Bike   Level  2    Watts  15    Minutes  10    METs  2.39      NuStep   Level  2    SPM  85    Minutes  10    METs  2      Track   Laps  7    Minutes  10    METs  2.23      Prescription Details   Frequency (times per week)  2    Duration  Progress to 30 minutes of continuous aerobic without signs/symptoms of  physical distress      Intensity   THRR 40-80% of Max Heartrate  64-127    Ratings of Perceived Exertion  11-13    Perceived Dyspnea  0-4      Progression   Progression  Continue to progress workloads to maintain intensity without signs/symptoms of physical distress.      Resistance Training   Training Prescription  Yes    Weight  3lbs    Reps  10-15       Perform Capillary Blood Glucose checks as needed.  Exercise Prescription Changes:   Exercise Comments:   Exercise Goals and Review: Exercise Goals    Row Name 12/07/17 0915             Exercise Goals   Increase Physical Activity  Yes       Intervention  Provide advice, education, support and counseling about physical activity/exercise needs.;Develop an individualized exercise prescription for aerobic and resistive training based on initial evaluation findings, risk stratification, comorbidities  and participant's personal goals.       Expected Outcomes  Short Term: Attend rehab on a regular basis to increase amount of physical activity.;Long Term: Exercising regularly at least 3-5 days a week.;Long Term: Add in home exercise to make exercise part of routine and to increase amount of physical activity.       Increase Strength and Stamina  Yes       Intervention  Provide advice, education, support and counseling about physical activity/exercise needs.;Develop an individualized exercise prescription for aerobic and resistive training based on initial evaluation findings, risk stratification, comorbidities and participant's personal goals.       Expected Outcomes  Short Term: Increase workloads from initial exercise prescription for resistance, speed, and METs.;Short Term: Perform resistance training exercises routinely during rehab and add in resistance training at home;Long Term: Improve cardiorespiratory fitness, muscular endurance and strength as measured by increased METs and functional capacity (6MWT)       Able to  understand and use rate of perceived exertion (RPE) scale  Yes       Intervention  Provide education and explanation on how to use RPE scale       Expected Outcomes  Long Term:  Able to use RPE to guide intensity level when exercising independently;Short Term: Able to use RPE daily in rehab to express subjective intensity level       Able to understand and use Dyspnea scale  Yes       Intervention  Provide education and explanation on how to use Dyspnea scale       Expected Outcomes  Short Term: Able to use Dyspnea scale daily in rehab to express subjective sense of shortness of breath during exertion;Long Term: Able to use Dyspnea scale to guide intensity level when exercising independently       Knowledge and understanding of Target Heart Rate Range (THRR)  Yes       Intervention  Provide education and explanation of THRR including how the numbers were predicted and where they are located for reference       Expected Outcomes  Short Term: Able to state/look up THRR;Long Term: Able to use THRR to govern intensity when exercising independently;Short Term: Able to use daily as guideline for intensity in rehab       Able to check pulse independently  Yes       Intervention  Provide education and demonstration on how to check pulse in carotid and radial arteries.;Review the importance of being able to check your own pulse for safety during independent exercise       Expected Outcomes  Short Term: Able to explain why pulse checking is important during independent exercise;Long Term: Able to check pulse independently and accurately       Understanding of Exercise Prescription  Yes       Intervention  Provide education, explanation, and written materials on patient's individual exercise prescription       Expected Outcomes  Short Term: Able to explain program exercise prescription;Long Term: Able to explain home exercise prescription to exercise independently          Exercise Goals Re-Evaluation  :    Discharge Exercise Prescription (Final Exercise Prescription Changes):   Nutrition:  Target Goals: Understanding of nutrition guidelines, daily intake of sodium 1500mg , cholesterol 200mg , calories 30% from fat and 7% or less from saturated fats, daily to have 5 or more servings of fruits and vegetables.  Biometrics: Pre Biometrics - 12/07/17 5102  Pre Biometrics   Height  5' 10.25" (1.784 m)    Weight  272 lb 11.3 oz (123.7 kg)    Waist Circumference  45 inches    Hip Circumference  50.75 inches    Waist to Hip Ratio  0.89 %    BMI (Calculated)  38.87    Triceps Skinfold  33 mm    % Body Fat  36.8 %    Grip Strength  32.5 kg    Flexibility  10.5 in    Single Leg Stand  1.47 seconds        Nutrition Therapy Plan and Nutrition Goals: Nutrition Therapy & Goals - 12/07/17 1218      Nutrition Therapy   Diet  consistent carbohydrate heart healthy      Personal Nutrition Goals   Nutrition Goal  Pt to identify and limit food sources of saturated fat, trans fat, and sodium    Personal Goal #2  Pt to identify food quantities necessary to achieve weight loss of 6-24 lbs. at graduation from cardiac rehab.       Intervention Plan   Intervention  Prescribe, educate and counsel regarding individualized specific dietary modifications aiming towards targeted core components such as weight, hypertension, lipid management, diabetes, heart failure and other comorbidities.    Expected Outcomes  Short Term Goal: Understand basic principles of dietary content, such as calories, fat, sodium, cholesterol and nutrients.       Nutrition Assessments: Nutrition Assessments - 12/07/17 1218      MEDFICTS Scores   Pre Score  -- did not return       Nutrition Goals Re-Evaluation:   Nutrition Goals Re-Evaluation:   Nutrition Goals Discharge (Final Nutrition Goals Re-Evaluation):   Psychosocial: Target Goals: Acknowledge presence or absence of significant depression and/or  stress, maximize coping skills, provide positive support system. Participant is able to verbalize types and ability to use techniques and skills needed for reducing stress and depression.  Initial Review & Psychosocial Screening: Initial Psych Review & Screening - 12/07/17 1230      Initial Review   Current issues with  None Identified      Family Dynamics   Good Support System?  Yes Orland Dec has his wife for support      Barriers   Psychosocial barriers to participate in program  There are no identifiable barriers or psychosocial needs.      Screening Interventions   Interventions  Encouraged to exercise       Quality of Life Scores:  Scores of 19 and below usually indicate a poorer quality of life in these areas.  A difference of  2-3 points is a clinically meaningful difference.  A difference of 2-3 points in the total score of the Quality of Life Index has been associated with significant improvement in overall quality of life, self-image, physical symptoms, and general health in studies assessing change in quality of life.  PHQ-9: Recent Review Flowsheet Data    Depression screen [facility] 2/9 10/28/2017 10/25/2017 08/04/2017 01/19/2017 07/15/2016   Decreased Interest 0 0 0 1 0   Down, Depressed, Hopeless 0 0 0 0 0   PHQ - 2 Score 0 0 0 1 0   Altered sleeping - - - 1 -   Tired, decreased energy - - - 1 -   Change in appetite - - - 1 -   Feeling bad or failure about yourself  - - - 0 -   Trouble concentrating - - - 0 -  Moving slowly or fidgety/restless - - - 0 -   Suicidal thoughts - - - 0 -   PHQ-9 Score - - - 4 -     Interpretation of Total Score  Total Score Depression Severity:  1-4 = Minimal depression, 5-9 = Mild depression, 10-14 = Moderate depression, 15-19 = Moderately severe depression, 20-27 = Severe depression   Psychosocial Evaluation and Intervention:   Psychosocial Re-Evaluation:   Psychosocial Discharge (Final Psychosocial Re-Evaluation):   Vocational  Rehabilitation: Provide vocational rehab assistance to qualifying candidates.   Vocational Rehab Evaluation & Intervention:   Education: Education Goals: Education classes will be provided on a weekly basis, covering required topics. Participant will state understanding/return demonstration of topics presented.  Learning Barriers/Preferences: Learning Barriers/Preferences - 12/07/17 1133      Learning Barriers/Preferences   Learning Barriers  None    Learning Preferences  Audio;Skilled Demonstration;Verbal Instruction;Written Material;Individual Instruction;Group Instruction       Education Topics: Count Your Pulse:  -Group instruction provided by verbal instruction, demonstration, patient participation and written materials to support subject.  Instructors address importance of being able to find your pulse and how to count your pulse when at home without a heart monitor.  Patients get hands on experience counting their pulse with staff help and individually.   Heart Attack, Angina, and Risk Factor Modification:  -Group instruction provided by verbal instruction, video, and written materials to support subject.  Instructors address signs and symptoms of angina and heart attacks.    Also discuss risk factors for heart disease and how to make changes to improve heart health risk factors.   Functional Fitness:  -Group instruction provided by verbal instruction, demonstration, patient participation, and written materials to support subject.  Instructors address safety measures for doing things around the house.  Discuss how to get up and down off the floor, how to pick things up properly, how to safely get out of a chair without assistance, and balance training.   Meditation and Mindfulness:  -Group instruction provided by verbal instruction, patient participation, and written materials to support subject.  Instructor addresses importance of mindfulness and meditation practice to help  reduce stress and improve awareness.  Instructor also leads participants through a meditation exercise.    Stretching for Flexibility and Mobility:  -Group instruction provided by verbal instruction, patient participation, and written materials to support subject.  Instructors lead participants through series of stretches that are designed to increase flexibility thus improving mobility.  These stretches are additional exercise for major muscle groups that are typically performed during regular warm up and cool down.   Hands Only CPR:  -Group verbal, video, and participation provides a basic overview of AHA guidelines for community CPR. Role-play of emergencies allow participants the opportunity to practice calling for help and chest compression technique with discussion of AED use.   Hypertension: -Group verbal and written instruction that provides a basic overview of hypertension including the most recent diagnostic guidelines, risk factor reduction with self-care instructions and medication management.    Nutrition I class: Heart Healthy Eating:  -Group instruction provided by PowerPoint slides, verbal discussion, and written materials to support subject matter. The instructor gives an explanation and review of the Therapeutic Lifestyle Changes diet recommendations, which includes a discussion on lipid goals, dietary fat, sodium, fiber, plant stanol/sterol esters, sugar, and the components of a well-balanced, healthy diet.   Nutrition II class: Lifestyle Skills:  -Group instruction provided by PowerPoint slides, verbal discussion, and written materials to support subject  matter. The instructor gives an explanation and review of label reading, grocery shopping for heart health, heart healthy recipe modifications, and ways to make healthier choices when eating out.   Diabetes Question & Answer:  -Group instruction provided by PowerPoint slides, verbal discussion, and written materials to  support subject matter. The instructor gives an explanation and review of diabetes co-morbidities, pre- and post-prandial blood glucose goals, pre-exercise blood glucose goals, signs, symptoms, and treatment of hypoglycemia and hyperglycemia, and foot care basics.   Diabetes Blitz:  -Group instruction provided by PowerPoint slides, verbal discussion, and written materials to support subject matter. The instructor gives an explanation and review of the physiology behind type 1 and type 2 diabetes, diabetes medications and rational behind using different medications, pre- and post-prandial blood glucose recommendations and Hemoglobin A1c goals, diabetes diet, and exercise including blood glucose guidelines for exercising safely.    Portion Distortion:  -Group instruction provided by PowerPoint slides, verbal discussion, written materials, and food models to support subject matter. The instructor gives an explanation of serving size versus portion size, changes in portions sizes over the last 20 years, and what consists of a serving from each food group.   Stress Management:  -Group instruction provided by verbal instruction, video, and written materials to support subject matter.  Instructors review role of stress in heart disease and how to cope with stress positively.     Exercising on Your Own:  -Group instruction provided by verbal instruction, power point, and written materials to support subject.  Instructors discuss benefits of exercise, components of exercise, frequency and intensity of exercise, and end points for exercise.  Also discuss use of nitroglycerin and activating EMS.  Review options of places to exercise outside of rehab.  Review guidelines for sex with heart disease.   Cardiac Drugs I:  -Group instruction provided by verbal instruction and written materials to support subject.  Instructor reviews cardiac drug classes: antiplatelets, anticoagulants, beta blockers, and statins.   Instructor discusses reasons, side effects, and lifestyle considerations for each drug class.   Cardiac Drugs II:  -Group instruction provided by verbal instruction and written materials to support subject.  Instructor reviews cardiac drug classes: angiotensin converting enzyme inhibitors (ACE-I), angiotensin II receptor blockers (ARBs), nitrates, and calcium channel blockers.  Instructor discusses reasons, side effects, and lifestyle considerations for each drug class.   Anatomy and Physiology of the Circulatory System:  Group verbal and written instruction and models provide basic cardiac anatomy and physiology, with the coronary electrical and arterial systems. Review of: AMI, Angina, Valve disease, Heart Failure, Peripheral Artery Disease, Cardiac Arrhythmia, Pacemakers, and the ICD.   Other Education:  -Group or individual verbal, written, or video instructions that support the educational goals of the cardiac rehab program.   Holiday Eating Survival Tips:  -Group instruction provided by PowerPoint slides, verbal discussion, and written materials to support subject matter. The instructor gives patients tips, tricks, and techniques to help them not only survive but enjoy the holidays despite the onslaught of food that accompanies the holidays.   Knowledge Questionnaire Score: Knowledge Questionnaire Score - 12/07/17 1053      Knowledge Questionnaire Score   Pre Score  17/24       Core Components/Risk Factors/Patient Goals at Admission: Personal Goals and Risk Factors at Admission - 12/07/17 1124      Core Components/Risk Factors/Patient Goals on Admission    Weight Management  Yes;Obesity    Intervention  Weight Management/Obesity: Establish reasonable short term and long  term weight goals.;Obesity: Provide education and appropriate resources to help participant work on and attain dietary goals.    Admit Weight  272 lb 11.3 oz (123.7 kg)    Expected Outcomes  Short Term:  Continue to assess and modify interventions until short term weight is achieved;Long Term: Adherence to nutrition and physical activity/exercise program aimed toward attainment of established weight goal;Weight Loss: Understanding of general recommendations for a balanced deficit meal plan, which promotes 1-2 lb weight loss per week and includes a negative energy balance of [phone] kcal/d    Diabetes  Yes    Intervention  Provide education about signs/symptoms and action to take for hypo/hyperglycemia.;Provide education about proper nutrition, including hydration, and aerobic/resistive exercise prescription along with prescribed medications to achieve blood glucose in normal ranges: Fasting glucose 65-99 mg/dL    Expected Outcomes  Short Term: Participant verbalizes understanding of the signs/symptoms and immediate care of hyper/hypoglycemia, proper foot care and importance of medication, aerobic/resistive exercise and nutrition plan for blood glucose control.;Long Term: Attainment of HbA1C < 7%.    Heart Failure  Yes    Intervention  Provide a combined exercise and nutrition program that is supplemented with education, support and counseling about heart failure. Directed toward relieving symptoms such as shortness of breath, decreased exercise tolerance, and extremity edema.    Expected Outcomes  Improve functional capacity of life;Short term: Attendance in program 2-3 days a week with increased exercise capacity. Reported lower sodium intake. Reported increased fruit and vegetable intake. Reports medication compliance.;Short term: Daily weights obtained and reported for increase. Utilizing diuretic protocols set by physician.;Long term: Adoption of self-care skills and reduction of barriers for early signs and symptoms recognition and intervention leading to self-care maintenance.    Hypertension  Yes    Intervention  Provide education on lifestyle modifcations including regular physical  activity/exercise, weight management, moderate sodium restriction and increased consumption of fresh fruit, vegetables, and low fat dairy, alcohol moderation, and smoking cessation.;Monitor prescription use compliance.    Expected Outcomes  Short Term: Continued assessment and intervention until BP is < 140/21mm HG in hypertensive participants. < 130/60mm HG in hypertensive participants with diabetes, heart failure or chronic kidney disease.;Long Term: Maintenance of blood pressure at goal levels.    Lipids  Yes    Intervention  Provide education and support for participant on nutrition & aerobic/resistive exercise along with prescribed medications to achieve LDL 70mg , HDL >40mg .    Expected Outcomes  Short Term: Participant states understanding of desired cholesterol values and is compliant with medications prescribed. Participant is following exercise prescription and nutrition guidelines.;Long Term: Cholesterol controlled with medications as prescribed, with individualized exercise RX and with personalized nutrition plan. Value goals: LDL < 70mg , HDL > 40 mg.       Core Components/Risk Factors/Patient Goals Review:    Core Components/Risk Factors/Patient Goals at Discharge (Final Review):    ITP Comments: ITP Comments    Row Name 12/06/17 1703           ITP Comments  Dr. Fransico Him, Medical Director           Comments: Orland Dec attended orientation from [phone] to 1043 to review rules and guidelines for program. Completed 6 minute walk test. [name] used a rolling walker for stability wore bilateral knee braces but did not stop to rest during his walk test., Intitial ITP, and exercise prescription.  VSS. Telemetry-Sinus Rhythm with a first degree heart block frequent multifocal PVC's. Pace on demand. couplet and a 4  beat run rate 120-150.  Asymptomatic with ectopy. The heart failure clinic called and notified.Will fax exercise flow sheets to Dr. Claris Gladden office for review. Duck wore 2l/min of  oxygen today during orientation and his walk test.Maria Venetia Maxon, RN,BSN 12/07/2017 12:43 PM

## 2017-12-07 NOTE — Patient Instructions (Addendum)
Pre visit review using our clinic review tool, if applicable. No additional management support is needed unless otherwise documented below in the visit note.  Take 1 tablet on Sun/Tues/Thurs and take 2 tablets on Monday/Fridays and take 1 1/2 tablets on Wednesdays.  Re-check in 4 weeks.

## 2017-12-08 ENCOUNTER — Telehealth (HOSPITAL_COMMUNITY): Payer: Self-pay | Admitting: Cardiac Rehabilitation

## 2017-12-08 NOTE — Telephone Encounter (Signed)
-----   Message from Larey Dresser, MD sent at 12/07/2017  4:24 PM EDT ----- Regarding: RE: cardiac rehab  yes ----- Message ----- From: Lowell Guitar, RN Sent: 12/06/2017   5:05 PM To: Larey Dresser, MD Subject: cardiac rehab                                  Dear Dr. Aundra Dubin,  Pt uses home oxygen.  Is it ok to use oxygen at cardiac rehab titrate to keep 02 sats .92%?  Thank you, Andi Hence, RN, BSN Cardiac Pulmonary Rehab

## 2017-12-13 NOTE — Progress Notes (Signed)
Advanced Heart Failure Clinic Note PCP: Primary Cardiologist: Dr. [facility]   HPI: [name] is a 62 y.o. male with history of longstanding cardiomyopathy out of proportion to CAD/chronic systolic CHF (EF 34-19% 07/7900), prior VT, s/p St Jude ICD, CAD, CVA, OSA on CPAP, pulmonary HTN on O2 at home, PAF on coumadin, HTN, and CKD stage 3.  Admitted from Dr Hochrein's office 10/01/17 with volume overload. HF team was consulted. Underwent R/LHC 10/04/17 - full report below. PYP scan and myeloma panel completed and were not suggestive of cardiac amyloidosis. V/Q scan completed for elevated PA pressures on cath, which was negative. He diuresed 31 lbs with IV lasix and metolazone and transitioned to torsemide 60 mg daily. HF meds were optimized. No ACE/ARB with CKD and soft BPs. Discharge weight 269 pounds.  He presents today for HF follow up. Last visit had run out of torsemide and was taking hydral/imdur incorrectly. Overall doing well. He is back on all his medications. He is only SOB with steps, which is a huge improvement for him. Sleeps on a wedge at baseline. Denies edema or PND. No CP or dizziness. Starts cardiac rehab next week. Weights stable at home 263-266 lbs. Limits fluid and salt intake. Taking all medications. He is wearing 2L O2 PRN. Unable to carry around his O2 take and [facility] has said a concentrator would cost him $600.   ICD interrogation: thoracic impedence right at threshold. Active 1-2 hours/day  V/Q scan 10/07/17:No appreciable ventilation or perfusion defects.  PYP 10/05/17:Visual and quantitative assessment (grade 0/1, H/CLL equal 1.3) are equivocal for transthyretin amyloidosis.  LHC 10/04/17: Left Main  No significant disease.  Left Anterior Descending  Large, wrap-around LAD with 30% mid vessel stenosis.  Left Circumflex  50-60% distal LCx stenosis.  Right Coronary Artery  50% mid RCA stenosis.   [place] 10/04/17: RA mean 19 RV 72/23 PA 74/37,  mean 51 PCWP mean 19 LV 111/28 AO 112/80 Oxygen saturations: PA 55% AO 95% Cardiac Output (Fick) 4.68  Cardiac Index (Fick) 1.9  PVR 6.8 WU CVP/PCWP 1 PAPi 1.95 1. Nonischemic cardiomyopathy. Coronary disease well out of proportion to degree of cardiomyopathy.  2. Elevated R>L heart filling pressures, suggestive of significant RV failure with high CVP/PCWP ratio and PAPi <2.  3. Low cardiac output.  4. Mixed pulmonary venous/pulmonary arterial hypertension, ?due to hypoxemia from OSA or OHS/OSA.   Review of systems complete and found to be negative unless listed in HPI.    SH:  Social History   Socioeconomic History  . Marital status: Married    Spouse name: Not on file  . Number of children: 2  . Years of education: Not on file  . Highest education level: Not on file  Occupational History  . Occupation: medically retired due to heart failure  Social Needs  . Financial resource strain: Not on file  . Food insecurity:    Worry: Not on file    Inability: Not on file  . Transportation needs:    Medical: Yes    Non-medical: No  Tobacco Use  . Smoking status: Former Smoker    Last attempt to quit: 05/04/1986    Years since quitting: 31.6  . Smokeless tobacco: Never Used  . Tobacco comment: Quit smoking 30 yrs ago. Smoked as teenager less than 1/2 ppd. Smoked x 4 years.  Substance and Sexual Activity  . Alcohol use: No    Comment: occasionally  . Drug use: No  .  Sexual activity: Never  Lifestyle  . Physical activity:    Days per week: 0 days    Minutes per session: 0 min  . Stress: Not at all  Relationships  . Social connections:    Talks on phone: Not on file    Gets together: Not on file    Attends religious service: Not on file    Active member of club or organization: Not on file    Attends meetings of clubs or organizations: Not on file    Relationship status: Not on file  . Intimate partner violence:    Fear of current or ex partner: Not on file     Emotionally abused: Not on file    Physically abused: Not on file    Forced sexual activity: Not on file  Other Topics Concern  . Not on file  Social History Narrative  . Not on file    FH:  Family History  Problem Relation Age of Onset  . Coronary artery disease Mother   . Heart attack Mother   . Heart attack Maternal Uncle   . Heart attack Maternal Grandmother     Past Medical History:  Diagnosis Date  . CAD (coronary artery disease)    a. Initial nonobst by cath 2009. b. Cath 01/2013 in setting of VT storm: obstructive distal Cx disease (small and terminates in the AV groove, unlikely to cause significant ischemia or electrical instability), nonobstructive RCA disease, EF 15-20%.   . Cerebrovascular accident [facility])    a. 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 Chronic systolic CHF (congestive heart failure) ([place])    a. Likely NICM (out of proportion to CAD). b. 2009 - EF 25-30% by echo, 01/2013: 15-20% by cath. c. 11/2014 Echo: EF 35-40%, Gr1 DD, mild MR, mod TR, PASP 17mmHg.  . CKD (chronic kidney disease), stage II   . Dyslipidemia   . Gout   . HTN (hypertension)   . Hypokalemia   . ICD (implantable cardiac defibrillator) in place   . Insulin dependent diabetes mellitus ([place])   . Lipoma   . Nonischemic cardiomyopathy ([place])    a.  11/2014 Echo: EF 35-40%, Gr1 DD, mild MR, mod TR, PASP 25mmHg.  . OSA (obstructive sleep apnea)    does not wear cpap  . PAF (paroxysmal atrial fibrillation) ([place])    a. Noted 05/2008 by EKG;  b. CHA2DS2VASc = 5-6-->coumadin.  . Paroxysmal VT ([place])    a. s/p St. Jude ICD 2007. b. H/o paroxysmal VT/VF including VT storm 12/2012 admission prompting amio initiation;  c. 01/2013 ICD upgrade SJM 1411-36Q Ellipse VR single lead ICD.  [name] Pulmonary HTN ([place])    a. Mild by cath 01/2013.    Current Outpatient Medications  Medication Sig Dispense Refill  . acetaminophen (TYLENOL) 500 MG tablet Take 500-1,000 mg by mouth every 6 (six) hours as needed (for  bilateral knee pain).    [name] allopurinol (ZYLOPRIM) 300 MG tablet TAKE 1 TABLET BY MOUTH  DAILY 90 tablet 1  . amiodarone (PACERONE) 200 MG tablet Take 1 tablet (200 mg total) by mouth daily. 90 tablet 3  . aspirin EC 81 MG EC tablet Take 1 tablet (81 mg total) by mouth daily. 30 tablet 0  . atorvastatin (LIPITOR) 80 MG tablet Take 1 tablet (80 mg total) by mouth at bedtime. 90 tablet 3  . carvedilol (COREG) 25 MG tablet Take 0.5 tablets (12.5 mg total) by mouth 2 (two) times daily with a meal. 90 tablet  3  . cholecalciferol (VITAMIN D) 1000 units tablet Take 1,000 Units by mouth daily.    . digoxin (LANOXIN) 0.125 MG tablet Take 1 tablet (0.125 mg total) by mouth every other day. 45 tablet 3  . Insulin Glargine (LANTUS SOLOSTAR) 100 UNIT/ML Solostar Pen INJECT 10 UNITS INTO THE SKIN AT BEDTIME. 15 pen 2  . Insulin Pen Needle (PEN NEEDLES) 31G X 5 MM MISC 100 each by Does not apply route 2 (two) times daily. 100 each 5  . isosorbide-hydrALAZINE (BIDIL) 20-37.5 MG tablet Take 1 tablet by mouth 3 (three) times daily. 270 tablet 3  . nitroGLYCERIN (NITROSTAT) 0.4 MG SL tablet PLACE 1 TABLET UNDER TONGUE EVERY 5 MINS X3 DOSES AS NEEDED FOR CHEST PAIN 25 tablet 11  . potassium chloride SA (K-DUR,KLOR-CON) 20 MEQ tablet Take 1 tablet (20 mEq total) by mouth daily. 90 tablet 3  . spironolactone (ALDACTONE) 25 MG tablet Take 0.5 tablets (12.5 mg total) by mouth daily. 45 tablet 3  . torsemide (DEMADEX) 20 MG tablet Take 3 tablets (60 mg total) by mouth daily. Take extra 20 mg tablet once daily AS NEEDED for weight gain 3 lbs or more in 24 hrs. (Patient taking differently: Take 20 mg by mouth 3 (three) times daily. Take extra 20 mg tablet once daily AS NEEDED for weight gain 3 lbs or more in 24 hrs.) 360 tablet 3  . traMADol (ULTRAM) 50 MG tablet Take 1 tablet (50 mg total) by mouth every 8 (eight) hours as needed. 60 tablet 2  . Turmeric 500 MG CAPS Take 1,000 mg by mouth daily as needed (inflammation).     .  warfarin (COUMADIN) 5 MG tablet Take 5-10 mg by mouth See admin instructions. Take 5mg  every day except take 10mg  Mondays and Fridays and take 7.5mg  Wednesdays     No current facility-administered medications for this encounter.    Vitals:   12/14/17 1040  BP: 128/90  Pulse: 74  SpO2: 90%  Weight: 124.7 kg (275 lb)   Wt Readings from Last 3 Encounters:  12/14/17 124.7 kg (275 lb)  12/07/17 123.7 kg (272 lb 11.3 oz)  11/09/17 125.5 kg (276 lb 9.6 oz)    PHYSICAL EXAM: General: Well appearing. No resp difficulty. HEENT: Normal Neck: Supple. JVP 5-6. Carotids 2+ bilat; no bruits. No thyromegaly or nodule noted. Cor: PMI nondisplaced. RRR, No M/G/R noted Lungs: CTAB, normal effort. On 2 L O2 Abdomen: Soft, non-tender, non-distended, no HSM. No bruits or masses. +BS  Extremities: No cyanosis, clubbing, or rash. R and LLE no edema.  Neuro: Alert & orientedx3, cranial nerves grossly intact. moves all 4 extremities w/o difficulty. Affect pleasant  ASSESSMENT & PLAN: 1. Chronic Systolic HF due to NICM - St Jude ICD  - ECHO 07/28/2017 EF 20-25%. RV mildly dilated.  - NYHA II-III symptoms - Volume status stable - Continue 60 mg torsemide daily and instructed to take an extra 20 mg for 3 pound weight gain in 24 hours. BMET today.  - Continue carvedilol 12.5 mg twice a day.  - Continue digoxin 0.125 mg daily.  - Continue bidil 1 tab three times a day.  - Increase spiro to 25 mg daily. BMET 7-10 days.  - Reinforced fluid restriction to < 2 L daily, sodium restriction to less than 2000 mg daily, and the importance of daily weights.    2. CKD stage 3 - Baseline baseline 1.7-1.9  - Stable 7/9 with creatinine 1.95, K 3.7  3. CAD - No  s/s ischemia.    - Continue statin and bb.  - On coumadin. Denies bleeding.   4. PAF  - On coumadin, followed by coumadin clinic on [place].  - Regular on exam.   6. Hx of VT - No VT/VF on last transmission (does not show VT/VF on interrogation in clinic  today)  7. Pulmonary HTN - Continue home O2 (2L). Kennyth Lose, HF CSW to speak with him today about options for concentrator or ?smaller O2 tank  BMET today Increase spiro to 25 mg daily BMET 7-10 days Follow up 6 weeks  Georgiana Shore, NP  10:56 AM  Greater than 50% of the 25 minute visit was spent in counseling/coordination of care regarding disease state education, salt/fluid restriction, sliding scale diuretics, and medication compliance.

## 2017-12-14 ENCOUNTER — Ambulatory Visit (HOSPITAL_COMMUNITY)
Admission: RE | Admit: 2017-12-14 | Discharge: 2017-12-14 | Disposition: A | Discharge status: Home / Self Care | Payer: Medicare Other | Source: Ambulatory Visit | Attending: Internal Medicine | Admitting: Internal Medicine

## 2017-12-14 ENCOUNTER — Encounter (HOSPITAL_COMMUNITY): Payer: Self-pay

## 2017-12-14 VITALS — BP 128/90 | HR 74 | Wt 275.0 lb

## 2017-12-14 DIAGNOSIS — E1122 Type 2 diabetes mellitus with diabetic chronic kidney disease: Secondary | ICD-10-CM | POA: Insufficient documentation

## 2017-12-14 DIAGNOSIS — N183 Chronic kidney disease, stage 3 unspecified: Secondary | ICD-10-CM

## 2017-12-14 DIAGNOSIS — I5022 Chronic systolic (congestive) heart failure: Secondary | ICD-10-CM | POA: Diagnosis not present

## 2017-12-14 DIAGNOSIS — Z87891 Personal history of nicotine dependence: Secondary | ICD-10-CM | POA: Insufficient documentation

## 2017-12-14 DIAGNOSIS — M109 Gout, unspecified: Secondary | ICD-10-CM | POA: Diagnosis not present

## 2017-12-14 DIAGNOSIS — I428 Other cardiomyopathies: Secondary | ICD-10-CM | POA: Diagnosis not present

## 2017-12-14 DIAGNOSIS — E785 Hyperlipidemia, unspecified: Secondary | ICD-10-CM | POA: Diagnosis not present

## 2017-12-14 DIAGNOSIS — Z95810 Presence of automatic (implantable) cardiac defibrillator: Secondary | ICD-10-CM | POA: Insufficient documentation

## 2017-12-14 DIAGNOSIS — Z8673 Personal history of transient ischemic attack (TIA), and cerebral infarction without residual deficits: Secondary | ICD-10-CM | POA: Diagnosis not present

## 2017-12-14 DIAGNOSIS — I130 Hypertensive heart and chronic kidney disease with heart failure and stage 1 through stage 4 chronic kidney disease, or unspecified chronic kidney disease: Secondary | ICD-10-CM | POA: Insufficient documentation

## 2017-12-14 DIAGNOSIS — I272 Pulmonary hypertension, unspecified: Secondary | ICD-10-CM | POA: Diagnosis not present

## 2017-12-14 DIAGNOSIS — Z7901 Long term (current) use of anticoagulants: Secondary | ICD-10-CM | POA: Insufficient documentation

## 2017-12-14 DIAGNOSIS — I2510 Atherosclerotic heart disease of native coronary artery without angina pectoris: Secondary | ICD-10-CM | POA: Insufficient documentation

## 2017-12-14 DIAGNOSIS — Z7982 Long term (current) use of aspirin: Secondary | ICD-10-CM | POA: Diagnosis not present

## 2017-12-14 DIAGNOSIS — Z79899 Other long term (current) drug therapy: Secondary | ICD-10-CM | POA: Insufficient documentation

## 2017-12-14 DIAGNOSIS — I5042 Chronic combined systolic (congestive) and diastolic (congestive) heart failure: Secondary | ICD-10-CM

## 2017-12-14 DIAGNOSIS — I480 Paroxysmal atrial fibrillation: Secondary | ICD-10-CM | POA: Insufficient documentation

## 2017-12-14 DIAGNOSIS — E876 Hypokalemia: Secondary | ICD-10-CM | POA: Insufficient documentation

## 2017-12-14 DIAGNOSIS — G4733 Obstructive sleep apnea (adult) (pediatric): Secondary | ICD-10-CM | POA: Diagnosis not present

## 2017-12-14 DIAGNOSIS — Z9981 Dependence on supplemental oxygen: Secondary | ICD-10-CM | POA: Diagnosis not present

## 2017-12-14 DIAGNOSIS — Z794 Long term (current) use of insulin: Secondary | ICD-10-CM | POA: Insufficient documentation

## 2017-12-14 LAB — BASIC METABOLIC PANEL
ANION GAP: 9 (ref 5–15)
BUN: 22 mg/dL (ref 8–23)
CALCIUM: 8.8 mg/dL — AB (ref 8.9–10.3)
CHLORIDE: 104 mmol/L (ref 98–111)
CO2: 30 mmol/L (ref 22–32)
Creatinine, Ser: 1.87 mg/dL — ABNORMAL HIGH (ref 0.61–1.24)
GFR calc Af Amer: 43 mL/min — ABNORMAL LOW (ref >60)
GFR calc non Af Amer: 37 mL/min — ABNORMAL LOW (ref >60)
GLUCOSE: 98 mg/dL (ref 70–99)
Potassium: 3.9 mmol/L (ref 3.5–5.1)
Sodium: 143 mmol/L (ref 135–145)

## 2017-12-14 MED ORDER — SPIRONOLACTONE 25 MG PO TABS: 25.0000 mg | ORAL_TABLET | Freq: Every day | ORAL | 3 refills | Status: DC

## 2017-12-14 NOTE — Progress Notes (Signed)
CSW referred to assist patient with an oxygen concentrator. Patient reports he has tanks but they are too heavy to get around and he has been traveling with no oxygen. Patient reports [facility] told him it would be $600 for a portable 02 concentrator. CSW encouraged patient to follow up with his insurance and see if there were other options. Patient will follow up and return call to CSW if needed. Raquel Sarna, [place], [place]

## 2017-12-14 NOTE — Patient Instructions (Signed)
Routine lab work today. Will notify you of abnormal results, otherwise no news is good news!  INCREASE Spironolactone to 25 mg (1 whole tablet) once daily.  Return in 1-2 weeks for repeat lab work.  ___________________________________________________________________________ [name] Code: 1500  Follow up 6 weeks with Darius Mountain NP-C.  ____________________________________________________________________________ [name] Code: 1600  Take all medication as prescribed the day of your appointment. Bring all medications with you to your appointment.  Do the following things EVERYDAY: 1) Weigh yourself in the morning before breakfast. Write it down and keep it in a log. 2) Take your medicines as prescribed 3) Eat low salt foods-Limit salt (sodium) to 2000 mg per day.  4) Stay as active as you can everyday 5) Limit all fluids for the day to less than 2 liters

## 2017-12-14 NOTE — Addendum Note (Signed)
Encounter addended by: Louann Liv, LCSW on: 12/14/2017 4:19 PM  Actions taken: Sign clinical note

## 2017-12-15 ENCOUNTER — Encounter (HOSPITAL_COMMUNITY): Payer: Medicare Other

## 2017-12-17 ENCOUNTER — Encounter (HOSPITAL_COMMUNITY): Payer: Medicare Other

## 2017-12-20 ENCOUNTER — Encounter (HOSPITAL_COMMUNITY): Payer: Medicare Other

## 2017-12-21 ENCOUNTER — Ambulatory Visit (HOSPITAL_COMMUNITY)
Admission: RE | Admit: 2017-12-21 | Discharge: 2017-12-21 | Disposition: A | Discharge status: Home / Self Care | Payer: Medicare Other | Source: Ambulatory Visit | Attending: Internal Medicine | Admitting: Internal Medicine

## 2017-12-21 DIAGNOSIS — I5042 Chronic combined systolic (congestive) and diastolic (congestive) heart failure: Secondary | ICD-10-CM | POA: Diagnosis not present

## 2017-12-21 LAB — BASIC METABOLIC PANEL
Anion gap: 11 (ref 5–15)
BUN: 23 mg/dL (ref 8–23)
CALCIUM: 9.2 mg/dL (ref 8.9–10.3)
CO2: 29 mmol/L (ref 22–32)
CREATININE: 2.06 mg/dL — AB (ref 0.61–1.24)
Chloride: 103 mmol/L (ref 98–111)
GFR calc Af Amer: 38 mL/min — ABNORMAL LOW (ref >60)
GFR, EST NON AFRICAN AMERICAN: 33 mL/min — AB (ref >60)
GLUCOSE: 165 mg/dL — AB (ref 70–99)
Potassium: 3.9 mmol/L (ref 3.5–5.1)
SODIUM: 143 mmol/L (ref 135–145)

## 2017-12-22 ENCOUNTER — Encounter (HOSPITAL_COMMUNITY): Payer: Medicare Other

## 2017-12-24 ENCOUNTER — Encounter (HOSPITAL_COMMUNITY): Payer: Medicare Other

## 2017-12-27 ENCOUNTER — Encounter (HOSPITAL_COMMUNITY): Payer: Self-pay

## 2017-12-27 ENCOUNTER — Encounter (HOSPITAL_COMMUNITY): Payer: Medicare Other

## 2017-12-27 ENCOUNTER — Encounter (HOSPITAL_COMMUNITY)
Admission: RE | Admit: 2017-12-27 | Discharge: 2017-12-27 | Disposition: A | Discharge status: Home / Self Care | Payer: Medicare Other | Source: Ambulatory Visit | Attending: Cardiology | Admitting: Cardiology

## 2017-12-27 DIAGNOSIS — E119 Type 2 diabetes mellitus without complications: Secondary | ICD-10-CM | POA: Diagnosis not present

## 2017-12-27 DIAGNOSIS — I5022 Chronic systolic (congestive) heart failure: Secondary | ICD-10-CM | POA: Diagnosis not present

## 2017-12-27 LAB — GLUCOSE, CAPILLARY
GLUCOSE-CAPILLARY: 107 mg/dL — AB (ref 70–99)
GLUCOSE-CAPILLARY: 120 mg/dL — AB (ref 70–99)

## 2017-12-27 NOTE — Progress Notes (Signed)
Daily Session Note  Patient Details  Name: [name] MRN: [id number] Date of Birth: 06-Nov-1955 Referring Provider:     CARDIAC REHAB PHASE II ORIENTATION from 12/07/2017 in [place]  Referring Provider  Minus Breeding, MD      Encounter Date: 12/27/2017  Check In: Session Check In - 12/27/17 1215      Check-In   Supervising physician immediately available to respond to emergencies  [facility] immediately available    Physician(s)  Dr. Algis Liming    Location  [facility]    Staff Present  Seward Carol, MS, ACSM CEP, Exercise Physiologist;Carlette Wilber Oliphant, RN, BSN;Joann Rion, RN, [facility], MS, ACSM RCEP, Exercise Physiologist;Amber Fair, MS, ACSM RCEP, Exercise Physiologist    Medication changes reported      No    Fall or balance concerns reported     No    Tobacco Cessation  No Change    Warm-up and Cool-down  Performed as group-led instruction    Resistance Training Performed  Yes    VAD Patient?  No    PAD/SET Patient?  No      Pain Assessment   Currently in Pain?  No/denies    Multiple Pain Sites  No       Capillary Blood Glucose: Results for orders placed or performed during the hospital encounter of 12/27/17 (from the past 24 hour(s))  Glucose, capillary     Status: Abnormal   Collection Time: 12/27/17 11:23 AM  Result Value Ref Range   Glucose-Capillary 107 (H) 70 - 99 mg/dL  Glucose, capillary     Status: Abnormal   Collection Time: 12/27/17 12:11 PM  Result Value Ref Range   Glucose-Capillary 120 (H) 70 - 99 mg/dL    Exercise Prescription Changes - 12/27/17 1300      Response to Exercise   Blood Pressure (Admit)  108/60    Blood Pressure (Exercise)  114/62    Blood Pressure (Exit)  110/74    Heart Rate (Admit)  98 bpm    Heart Rate (Exercise)  102 bpm    Heart Rate (Exit)  106 bpm    Oxygen Saturation (Admit)  92 %    Oxygen Saturation (Exercise)  91 %    Oxygen Saturation  (Exit)  92 %    Rating of Perceived Exertion (Exercise)  13    Perceived Dyspnea (Exercise)  0    Symptoms  Pt reported knee pain 6/10    Comments  Pt oriented to exercise equipment     Duration  Progress to 30 minutes of  aerobic without signs/symptoms of physical distress    Intensity  THRR New      Progression   Progression  Continue to progress workloads to maintain intensity without signs/symptoms of physical distress.    Average METs  2      Resistance Training   Training Prescription  Yes    Weight  3lbs     Reps  10-15    Time  10 Minutes      Interval Training   Interval Training  No      Recumbant Bike   Level  2    Watts  15    Minutes  10    METs  2      NuStep   Level  2    SPM  80    Minutes  10    METs  1.8  Track   Laps  7    Minutes  10    METs  2.23       Social History   Tobacco Use  Smoking Status Former Smoker  . Last attempt to quit: 05/04/1986  . Years since quitting: 31.6  Smokeless Tobacco Never Used  Tobacco Comment   Quit smoking 30 yrs ago. Smoked as teenager less than 1/2 ppd. Smoked x 4 years.    Goals Met:  Exercise tolerated well  Goals Unmet:  Not Applicable  Comments: Pt started cardiac rehab today.  Pt tolerated light exercise without difficulty. VSS, telemetry-SR with frequent PVC's and occasional on demand pacing, asymptomatic.  Medication list reconciled. Pt denies barriers to medicaiton compliance.  PSYCHOSOCIAL ASSESSMENT:  PHQ-0. Pt exhibits positive coping skills, hopeful outlook with supportive family. No psychosocial needs identified at this time, no psychosocial interventions necessary.  Pt oriented to exercise equipment and routine.    Understanding verbalized.    Dr. Fransico Him is Medical Director for Cardiac Rehab at [facility]

## 2017-12-29 ENCOUNTER — Encounter (HOSPITAL_COMMUNITY): Payer: Medicare Other

## 2017-12-29 ENCOUNTER — Encounter (HOSPITAL_COMMUNITY)
Admission: RE | Admit: 2017-12-29 | Discharge: 2017-12-29 | Disposition: A | Discharge status: Home / Self Care | Payer: Medicare Other | Source: Ambulatory Visit | Attending: Cardiology | Admitting: Cardiology

## 2017-12-29 DIAGNOSIS — I5022 Chronic systolic (congestive) heart failure: Secondary | ICD-10-CM | POA: Diagnosis not present

## 2017-12-29 DIAGNOSIS — E119 Type 2 diabetes mellitus without complications: Secondary | ICD-10-CM | POA: Diagnosis not present

## 2017-12-31 ENCOUNTER — Encounter (HOSPITAL_COMMUNITY): Admission: RE | Admit: 2017-12-31 | Payer: Medicare Other | Source: Ambulatory Visit

## 2017-12-31 ENCOUNTER — Encounter (HOSPITAL_COMMUNITY): Payer: Medicare Other

## 2017-12-31 DIAGNOSIS — I5043 Acute on chronic combined systolic (congestive) and diastolic (congestive) heart failure: Secondary | ICD-10-CM | POA: Diagnosis not present

## 2018-01-04 ENCOUNTER — Ambulatory Visit (INDEPENDENT_AMBULATORY_CARE_PROVIDER_SITE_OTHER): Payer: Medicare Other | Admitting: General Practice

## 2018-01-04 DIAGNOSIS — I4891 Unspecified atrial fibrillation: Secondary | ICD-10-CM

## 2018-01-04 DIAGNOSIS — Z7901 Long term (current) use of anticoagulants: Secondary | ICD-10-CM

## 2018-01-04 DIAGNOSIS — Z8679 Personal history of other diseases of the circulatory system: Secondary | ICD-10-CM | POA: Diagnosis not present

## 2018-01-04 DIAGNOSIS — Z23 Encounter for immunization: Secondary | ICD-10-CM | POA: Diagnosis not present

## 2018-01-04 LAB — POCT INR: INR: 2.9 (ref 2.0–3.0)

## 2018-01-04 NOTE — Patient Instructions (Signed)
Pre visit review using our clinic review tool, if applicable. No additional management support is needed unless otherwise documented below in the visit note. Take 1 tablet on Sun/Tues/Thurs/Sat. and take 2 tablets on Monday/Fridays and take 1 1/2 tablets on Wednesdays.  Re-check in 4 weeks.

## 2018-01-05 ENCOUNTER — Encounter (HOSPITAL_COMMUNITY): Payer: Self-pay

## 2018-01-05 ENCOUNTER — Encounter (HOSPITAL_COMMUNITY)
Admission: RE | Admit: 2018-01-05 | Discharge: 2018-01-05 | Disposition: A | Discharge status: Home / Self Care | Payer: Medicare Other | Source: Ambulatory Visit | Attending: Cardiology | Admitting: Cardiology

## 2018-01-05 ENCOUNTER — Encounter (HOSPITAL_COMMUNITY): Payer: Medicare Other

## 2018-01-05 DIAGNOSIS — E119 Type 2 diabetes mellitus without complications: Secondary | ICD-10-CM | POA: Diagnosis not present

## 2018-01-05 DIAGNOSIS — I5022 Chronic systolic (congestive) heart failure: Secondary | ICD-10-CM | POA: Insufficient documentation

## 2018-01-06 NOTE — Progress Notes (Signed)
Cardiac Individual Treatment Plan  Patient Details  Name: [name] MRN: [id number] Date of Birth: 26-Apr-1956 Referring Provider:     [place] from 12/07/2017 in [place]  Referring Provider  Minus Breeding, MD      Initial Encounter Date:    CARDIAC REHAB PHASE II ORIENTATION from 12/07/2017 in [place]  Date  12/07/17      Visit Diagnosis: 7494 Chronic systolic congestive heart failure ([place])  Patient's Home Medications on Admission:  Current Outpatient Medications:  .  acetaminophen (TYLENOL) 500 MG tablet, Take 500-1,000 mg by mouth every 6 (six) hours as needed (for bilateral knee pain)., Disp: , Rfl:  .  allopurinol (ZYLOPRIM) 300 MG tablet, TAKE 1 TABLET BY MOUTH  DAILY, Disp: 90 tablet, Rfl: 1 .  amiodarone (PACERONE) 200 MG tablet, Take 1 tablet (200 mg total) by mouth daily., Disp: 90 tablet, Rfl: 3 .  aspirin EC 81 MG EC tablet, Take 1 tablet (81 mg total) by mouth daily., Disp: 30 tablet, Rfl: 0 .  atorvastatin (LIPITOR) 80 MG tablet, Take 1 tablet (80 mg total) by mouth at bedtime., Disp: 90 tablet, Rfl: 3 .  carvedilol (COREG) 25 MG tablet, Take 0.5 tablets (12.5 mg total) by mouth 2 (two) times daily with a meal., Disp: 90 tablet, Rfl: 3 .  cholecalciferol (VITAMIN D) 1000 units tablet, Take 1,000 Units by mouth daily., Disp: , Rfl:  .  digoxin (LANOXIN) 0.125 MG tablet, Take 1 tablet (0.125 mg total) by mouth every other day., Disp: 45 tablet, Rfl: 3 .  Insulin Glargine (LANTUS SOLOSTAR) 100 UNIT/ML Solostar Pen, INJECT 10 UNITS INTO THE SKIN AT BEDTIME., Disp: 15 pen, Rfl: 2 .  Insulin Pen Needle (PEN NEEDLES) 31G X 5 MM MISC, 100 each by Does not apply route 2 (two) times daily., Disp: 100 each, Rfl: 5 .  isosorbide-hydrALAZINE (BIDIL) 20-37.5 MG tablet, Take 1 tablet by mouth 3 (three) times daily., Disp: 270 tablet, Rfl: 3 .  nitroGLYCERIN (NITROSTAT) 0.4 MG SL tablet,  PLACE 1 TABLET UNDER TONGUE EVERY 5 MINS X3 DOSES AS NEEDED FOR CHEST PAIN, Disp: 25 tablet, Rfl: 11 .  potassium chloride SA (K-DUR,KLOR-CON) 20 MEQ tablet, Take 1 tablet (20 mEq total) by mouth daily., Disp: 90 tablet, Rfl: 3 .  spironolactone (ALDACTONE) 25 MG tablet, Take 1 tablet (25 mg total) by mouth daily., Disp: 90 tablet, Rfl: 3 .  torsemide (DEMADEX) 20 MG tablet, Take 3 tablets (60 mg total) by mouth daily. Take extra 20 mg tablet once daily AS NEEDED for weight gain 3 lbs or more in 24 hrs. (Patient taking differently: Take 20 mg by mouth 3 (three) times daily. Take extra 20 mg tablet once daily AS NEEDED for weight gain 3 lbs or more in 24 hrs.), Disp: 360 tablet, Rfl: 3 .  traMADol (ULTRAM) 50 MG tablet, Take 1 tablet (50 mg total) by mouth every 8 (eight) hours as needed. (Patient not taking: Reported on 12/27/2017), Disp: 60 tablet, Rfl: 2 .  Turmeric 500 MG CAPS, Take 1,000 mg by mouth daily as needed (inflammation). , Disp: , Rfl:  .  warfarin (COUMADIN) 5 MG tablet, Take 5-10 mg by mouth See admin instructions. Take 5mg  every day except take 10mg  Mondays and Fridays and take 7.5mg  Wednesdays, Disp: , Rfl:   Past Medical History: Past Medical History:  Diagnosis Date  . CAD (coronary artery disease)    a. Initial nonobst  by cath 2009. b. Cath 01/2013 in setting of VT storm: obstructive distal Cx disease (small and terminates in the AV groove, unlikely to cause significant ischemia or electrical instability), nonobstructive RCA disease, EF 15-20%.   . Cerebrovascular accident [facility])    a. 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 Chronic systolic CHF (congestive heart failure) ([place])    a. Likely NICM (out of proportion to CAD). b. 2009 - EF 25-30% by echo, 01/2013: 15-20% by cath. c. 11/2014 Echo: EF 35-40%, Gr1 DD, mild MR, mod TR, PASP 8mmHg.  . CKD (chronic kidney disease), stage II   . Dyslipidemia   . Gout   . HTN (hypertension)   . Hypokalemia   . ICD (implantable cardiac  defibrillator) in place   . Insulin dependent diabetes mellitus ([place])   . Lipoma   . Nonischemic cardiomyopathy ([place])    a.  11/2014 Echo: EF 35-40%, Gr1 DD, mild MR, mod TR, PASP 75mmHg.  . OSA (obstructive sleep apnea)    does not wear cpap  . PAF (paroxysmal atrial fibrillation) ([place])    a. Noted 05/2008 by EKG;  b. CHA2DS2VASc = 5-6-->coumadin.  . Paroxysmal VT ([place])    a. s/p St. Jude ICD 2007. b. H/o paroxysmal VT/VF including VT storm 12/2012 admission prompting amio initiation;  c. 01/2013 ICD upgrade SJM [id number] Ellipse VR single lead ICD.  [name] Pulmonary HTN ([place])    a. Mild by cath 01/2013.    Tobacco Use: Social History   Tobacco Use  Smoking Status Former Smoker  . Last attempt to quit: 05/04/1986  . Years since quitting: 31.6  Smokeless Tobacco Never Used  Tobacco Comment   Quit smoking 30 yrs ago. Smoked as teenager less than 1/2 ppd. Smoked x 4 years.    Labs: Recent Review Flowsheet Data    Labs for ITP Cardiac and Pulmonary Rehab Latest Ref Rng & Units 01/19/2017 08/04/2017 10/02/2017 10/04/2017 10/04/2017   Cholestrol 0 - 200 mg/dL 141 - 114 - -   LDLCALC 0 - 99 mg/dL 87 - 79 - -   LDLDIRECT mg/dL - - - - -   HDL >40 mg/dL 41.10 - 24(L) - -   Trlycerides <150 mg/dL 69.0 - 55 - -   Hemoglobin A1c - 5.7 5.6 - - -   PHART 7.350 - 7.450 - - - - -   PCO2ART 35.0 - 45.0 mmHg - - - - -   HCO3 20.0 - 28.0 mmol/L - - - 36.7(H) 35.5(H)   TCO2 22 - 32 mmol/L - - - 39(H) 37(H)   ACIDBASEDEF 0.0 - 2.0 mmol/L - - - - -   O2SAT % - - - 55.0 55.0      Capillary Blood Glucose: Lab Results  Component Value Date   GLUCAP 120 (H) 12/27/2017   GLUCAP 107 (H) 12/27/2017   GLUCAP 155 (H) 10/07/2017   GLUCAP 101 (H) 10/07/2017   GLUCAP 105 (H) 10/06/2017     Exercise Target Goals: Exercise Program Goal: Individual exercise prescription set using results from initial 6 min walk test and THRR while considering  patient's activity barriers and safety.   Exercise Prescription  Goal: Initial exercise prescription builds to 30-45 minutes a day of aerobic activity, 2-3 days per week.  Home exercise guidelines will be given to patient during program as part of exercise prescription that the participant will acknowledge.  Activity Barriers & Risk Stratification: Activity Barriers & Cardiac Risk Stratification - 12/07/17 7078  Activity Barriers & Cardiac Risk Stratification   Activity Barriers  Other (comment);Arthritis    Comments  Slight MCL tear- lt knee; arthritis- lt knee; both knees weak.    Cardiac Risk Stratification  High       6 Minute Walk: 6 Minute Walk    Row Name 12/07/17 0845         6 Minute Walk   Phase  Initial     Distance  757 feet     Walk Time  6 minutes     # of Rest Breaks  0     MPH  1.43     METS  1.59     RPE  13     VO2 Peak  5.57     Symptoms  Yes (comment)     Comments  Patient c/o bilateral knee pain.     Resting HR  67 bpm     Resting BP  104/72     Resting Oxygen Saturation   94 % 2L/min O2     Exercise Oxygen Saturation  during 6 min walk  92 % 2L/min O2     Max Ex. HR  100 bpm     Max Ex. BP  106/62     2 Minute Post BP  102/60        Oxygen Initial Assessment:   Oxygen Re-Evaluation:   Oxygen Discharge (Final Oxygen Re-Evaluation):   Initial Exercise Prescription: Initial Exercise Prescription - 12/07/17 1100      Date of Initial Exercise RX and Referring Provider   Date  12/07/17    Referring Provider  Minus Breeding, MD      Oxygen   Oxygen  Continuous    Liters  2      Recumbant Bike   Level  2    Watts  15    Minutes  10    METs  2.39      NuStep   Level  2    SPM  85    Minutes  10    METs  2      Track   Laps  7    Minutes  10    METs  2.23      Prescription Details   Frequency (times per week)  2    Duration  Progress to 30 minutes of continuous aerobic without signs/symptoms of physical distress      Intensity   THRR 40-80% of Max Heartrate  64-127    Ratings of  Perceived Exertion  11-13    Perceived Dyspnea  0-4      Progression   Progression  Continue to progress workloads to maintain intensity without signs/symptoms of physical distress.      Resistance Training   Training Prescription  Yes    Weight  3lbs    Reps  10-15       Perform Capillary Blood Glucose checks as needed.  Exercise Prescription Changes: Exercise Prescription Changes    Row Name 12/27/17 1300             Response to Exercise   Blood Pressure (Admit)  108/60       Blood Pressure (Exercise)  114/62       Blood Pressure (Exit)  110/74       Heart Rate (Admit)  98 bpm       Heart Rate (Exercise)  102 bpm       Heart Rate (Exit)  106 bpm  Oxygen Saturation (Admit)  92 %       Oxygen Saturation (Exercise)  91 %       Oxygen Saturation (Exit)  92 %       Rating of Perceived Exertion (Exercise)  13       Perceived Dyspnea (Exercise)  0       Symptoms  Pt reported knee pain 6/10       Comments  Pt oriented to exercise equipment        Duration  Progress to 30 minutes of  aerobic without signs/symptoms of physical distress       Intensity  THRR New         Progression   Progression  Continue to progress workloads to maintain intensity without signs/symptoms of physical distress.       Average METs  2         Resistance Training   Training Prescription  Yes       Weight  3lbs        Reps  10-15       Time  10 Minutes         Interval Training   Interval Training  No         Recumbant Bike   Level  2       Watts  15       Minutes  10       METs  2         NuStep   Level  2       SPM  80       Minutes  10       METs  1.8         Track   Laps  7       Minutes  10       METs  2.23          Exercise Comments: Exercise Comments    Row Name 12/27/17 1349           Exercise Comments  Pt oriented to exercise equipment. Pt forget to wear knee braces. Pt reported knee pain, but plans to wear braces for future appoinments. Will continue to  monitor pt.           Exercise Goals and Review: Exercise Goals    Row Name 12/07/17 0915             Exercise Goals   Increase Physical Activity  Yes       Intervention  Provide advice, education, support and counseling about physical activity/exercise needs.;Develop an individualized exercise prescription for aerobic and resistive training based on initial evaluation findings, risk stratification, comorbidities and participant's personal goals.       Expected Outcomes  Short Term: Attend rehab on a regular basis to increase amount of physical activity.;Long Term: Exercising regularly at least 3-5 days a week.;Long Term: Add in home exercise to make exercise part of routine and to increase amount of physical activity.       Increase Strength and Stamina  Yes       Intervention  Provide advice, education, support and counseling about physical activity/exercise needs.;Develop an individualized exercise prescription for aerobic and resistive training based on initial evaluation findings, risk stratification, comorbidities and participant's personal goals.       Expected Outcomes  Short Term: Increase workloads from initial exercise prescription for resistance, speed, and METs.;Short Term: Perform resistance training exercises routinely during rehab and add  in resistance training at home;Long Term: Improve cardiorespiratory fitness, muscular endurance and strength as measured by increased METs and functional capacity (6MWT)       Able to understand and use rate of perceived exertion (RPE) scale  Yes       Intervention  Provide education and explanation on how to use RPE scale       Expected Outcomes  Long Term:  Able to use RPE to guide intensity level when exercising independently;Short Term: Able to use RPE daily in rehab to express subjective intensity level       Able to understand and use Dyspnea scale  Yes       Intervention  Provide education and explanation on how to use Dyspnea scale        Expected Outcomes  Short Term: Able to use Dyspnea scale daily in rehab to express subjective sense of shortness of breath during exertion;Long Term: Able to use Dyspnea scale to guide intensity level when exercising independently       Knowledge and understanding of Target Heart Rate Range (THRR)  Yes       Intervention  Provide education and explanation of THRR including how the numbers were predicted and where they are located for reference       Expected Outcomes  Short Term: Able to state/look up THRR;Long Term: Able to use THRR to govern intensity when exercising independently;Short Term: Able to use daily as guideline for intensity in rehab       Able to check pulse independently  Yes       Intervention  Provide education and demonstration on how to check pulse in carotid and radial arteries.;Review the importance of being able to check your own pulse for safety during independent exercise       Expected Outcomes  Short Term: Able to explain why pulse checking is important during independent exercise;Long Term: Able to check pulse independently and accurately       Understanding of Exercise Prescription  Yes       Intervention  Provide education, explanation, and written materials on patient's individual exercise prescription       Expected Outcomes  Short Term: Able to explain program exercise prescription;Long Term: Able to explain home exercise prescription to exercise independently          Exercise Goals Re-Evaluation :   Discharge Exercise Prescription (Final Exercise Prescription Changes): Exercise Prescription Changes - 12/27/17 1300      Response to Exercise   Blood Pressure (Admit)  108/60    Blood Pressure (Exercise)  114/62    Blood Pressure (Exit)  110/74    Heart Rate (Admit)  98 bpm    Heart Rate (Exercise)  102 bpm    Heart Rate (Exit)  106 bpm    Oxygen Saturation (Admit)  92 %    Oxygen Saturation (Exercise)  91 %    Oxygen Saturation (Exit)  92 %    Rating of  Perceived Exertion (Exercise)  13    Perceived Dyspnea (Exercise)  0    Symptoms  Pt reported knee pain 6/10    Comments  Pt oriented to exercise equipment     Duration  Progress to 30 minutes of  aerobic without signs/symptoms of physical distress    Intensity  THRR New      Progression   Progression  Continue to progress workloads to maintain intensity without signs/symptoms of physical distress.    Average METs  2  Resistance Training   Training Prescription  Yes    Weight  3lbs     Reps  10-15    Time  10 Minutes      Interval Training   Interval Training  No      Recumbant Bike   Level  2    Watts  15    Minutes  10    METs  2      NuStep   Level  2    SPM  80    Minutes  10    METs  1.8      Track   Laps  7    Minutes  10    METs  2.23       Nutrition:  Target Goals: Understanding of nutrition guidelines, daily intake of sodium 1500mg , cholesterol 200mg , calories 30% from fat and 7% or less from saturated fats, daily to have 5 or more servings of fruits and vegetables.  Biometrics: Pre Biometrics - 12/07/17 0843      Pre Biometrics   Height  5' 10.25" (1.784 m)    Weight  123.7 kg    Waist Circumference  45 inches    Hip Circumference  50.75 inches    Waist to Hip Ratio  0.89 %    BMI (Calculated)  38.87    Triceps Skinfold  33 mm    % Body Fat  36.8 %    Grip Strength  32.5 kg    Flexibility  10.5 in    Single Leg Stand  1.47 seconds        Nutrition Therapy Plan and Nutrition Goals: Nutrition Therapy & Goals - 12/07/17 1218      Nutrition Therapy   Diet  consistent carbohydrate heart healthy      Personal Nutrition Goals   Nutrition Goal  Pt to identify and limit food sources of saturated fat, trans fat, and sodium    Personal Goal #2  Pt to identify food quantities necessary to achieve weight loss of 6-24 lbs. at graduation from cardiac rehab.       Intervention Plan   Intervention  Prescribe, educate and counsel regarding  individualized specific dietary modifications aiming towards targeted core components such as weight, hypertension, lipid management, diabetes, heart failure and other comorbidities.    Expected Outcomes  Short Term Goal: Understand basic principles of dietary content, such as calories, fat, sodium, cholesterol and nutrients.       Nutrition Assessments: Nutrition Assessments - 12/28/17 0908      MEDFICTS Scores   Pre Score  15       Nutrition Goals Re-Evaluation:   Nutrition Goals Re-Evaluation:   Nutrition Goals Discharge (Final Nutrition Goals Re-Evaluation):   Psychosocial: Target Goals: Acknowledge presence or absence of significant depression and/or stress, maximize coping skills, provide positive support system. Participant is able to verbalize types and ability to use techniques and skills needed for reducing stress and depression.  Initial Review & Psychosocial Screening: Initial Psych Review & Screening - 12/07/17 1230      Initial Review   Current issues with  None Identified      Family Dynamics   Good Support System?  Yes   Orland Dec has his wife for support     Barriers   Psychosocial barriers to participate in program  There are no identifiable barriers or psychosocial needs.      Screening Interventions   Interventions  Encouraged to exercise  Quality of Life Scores: Quality of Life - 12/27/17 1358      Quality of Life   Select  Quality of Life      Quality of Life Scores   Health/Function Pre  22.5 %    Socioeconomic Pre  26.75 %    Psych/Spiritual Pre  30 %    Family Pre  30 %    GLOBAL Pre  26 %      Scores of 19 and below usually indicate a poorer quality of life in these areas.  A difference of  2-3 points is a clinically meaningful difference.  A difference of 2-3 points in the total score of the Quality of Life Index has been associated with significant improvement in overall quality of life, self-image, physical symptoms, and general  health in studies assessing change in quality of life.  PHQ-9: Recent Review Flowsheet Data    Depression screen [facility] 2/9 12/27/2017 10/28/2017 10/25/2017 08/04/2017 01/19/2017   Decreased Interest 0 0 0 0 1   Down, Depressed, Hopeless 0 0 0 0 0   PHQ - 2 Score 0 0 0 0 1   Altered sleeping - - - - 1   Tired, decreased energy - - - - 1   Change in appetite - - - - 1   Feeling bad or failure about yourself  - - - - 0   Trouble concentrating - - - - 0   Moving slowly or fidgety/restless - - - - 0   Suicidal thoughts - - - - 0   PHQ-9 Score - - - - 4     Interpretation of Total Score  Total Score Depression Severity:  1-4 = Minimal depression, 5-9 = Mild depression, 10-14 = Moderate depression, 15-19 = Moderately severe depression, 20-27 = Severe depression   Psychosocial Evaluation and Intervention: Psychosocial Evaluation - 12/27/17 1408      Psychosocial Evaluation & Interventions   Interventions  Encouraged to exercise with the program and follow exercise prescription    Comments  No psychosocial needs identified. Duck enjoys Lobby[employer], watching basketball, and attending outdoot events.     Expected Outcomes  Duck will continue to exhibit a positive outlook.    Continue Psychosocial Services   No Follow up required       Psychosocial Re-Evaluation: Psychosocial Re-Evaluation    [place] Name 01/05/18 1729             Psychosocial Re-Evaluation   Current issues with  None Identified       Comments  No psychosocial needs identified.        Expected Outcomes  Duck will continue to maintain a positive outlook.        Interventions  Encouraged to attend Cardiac Rehabilitation for the exercise       Continue Psychosocial Services   No Follow up required          Psychosocial Discharge (Final Psychosocial Re-Evaluation): Psychosocial Re-Evaluation - 01/05/18 1729      Psychosocial Re-Evaluation   Current issues with  None Identified    Comments  No psychosocial needs identified.      Expected Outcomes  Duck will continue to maintain a positive outlook.     Interventions  Encouraged to attend Cardiac Rehabilitation for the exercise    Continue Psychosocial Services   No Follow up required       Vocational Rehabilitation: Provide vocational rehab assistance to qualifying candidates.   Vocational Rehab Evaluation & Intervention:  Education: Education Goals: Education classes will be provided on a weekly basis, covering required topics. Participant will state understanding/return demonstration of topics presented.  Learning Barriers/Preferences: Learning Barriers/Preferences - 12/07/17 1133      Learning Barriers/Preferences   Learning Barriers  None    Learning Preferences  Audio;Skilled Demonstration;Verbal Instruction;Written Material;Individual Instruction;Group Instruction       Education Topics: Count Your Pulse:  -Group instruction provided by verbal instruction, demonstration, patient participation and written materials to support subject.  Instructors address importance of being able to find your pulse and how to count your pulse when at home without a heart monitor.  Patients get hands on experience counting their pulse with staff help and individually.   Heart Attack, Angina, and Risk Factor Modification:  -Group instruction provided by verbal instruction, video, and written materials to support subject.  Instructors address signs and symptoms of angina and heart attacks.    Also discuss risk factors for heart disease and how to make changes to improve heart health risk factors.   Functional Fitness:  -Group instruction provided by verbal instruction, demonstration, patient participation, and written materials to support subject.  Instructors address safety measures for doing things around the house.  Discuss how to get up and down off the floor, how to pick things up properly, how to safely get out of a chair without assistance, and balance  training.   Meditation and Mindfulness:  -Group instruction provided by verbal instruction, patient participation, and written materials to support subject.  Instructor addresses importance of mindfulness and meditation practice to help reduce stress and improve awareness.  Instructor also leads participants through a meditation exercise.    Stretching for Flexibility and Mobility:  -Group instruction provided by verbal instruction, patient participation, and written materials to support subject.  Instructors lead participants through series of stretches that are designed to increase flexibility thus improving mobility.  These stretches are additional exercise for major muscle groups that are typically performed during regular warm up and cool down.   Hands Only CPR:  -Group verbal, video, and participation provides a basic overview of AHA guidelines for community CPR. Role-play of emergencies allow participants the opportunity to practice calling for help and chest compression technique with discussion of AED use.   Hypertension: -Group verbal and written instruction that provides a basic overview of hypertension including the most recent diagnostic guidelines, risk factor reduction with self-care instructions and medication management.    Nutrition I class: Heart Healthy Eating:  -Group instruction provided by PowerPoint slides, verbal discussion, and written materials to support subject matter. The instructor gives an explanation and review of the Therapeutic Lifestyle Changes diet recommendations, which includes a discussion on lipid goals, dietary fat, sodium, fiber, plant stanol/sterol esters, sugar, and the components of a well-balanced, healthy diet.   Nutrition II class: Lifestyle Skills:  -Group instruction provided by PowerPoint slides, verbal discussion, and written materials to support subject matter. The instructor gives an explanation and review of label reading, grocery  shopping for heart health, heart healthy recipe modifications, and ways to make healthier choices when eating out.   Diabetes Question & Answer:  -Group instruction provided by PowerPoint slides, verbal discussion, and written materials to support subject matter. The instructor gives an explanation and review of diabetes co-morbidities, pre- and post-prandial blood glucose goals, pre-exercise blood glucose goals, signs, symptoms, and treatment of hypoglycemia and hyperglycemia, and foot care basics.   Diabetes Blitz:  -Group instruction provided by [employer], verbal discussion, and written materials  to support subject matter. The instructor gives an explanation and review of the physiology behind type 1 and type 2 diabetes, diabetes medications and rational behind using different medications, pre- and post-prandial blood glucose recommendations and Hemoglobin A1c goals, diabetes diet, and exercise including blood glucose guidelines for exercising safely.    Portion Distortion:  -Group instruction provided by PowerPoint slides, verbal discussion, written materials, and food models to support subject matter. The instructor gives an explanation of serving size versus portion size, changes in portions sizes over the last 20 years, and what consists of a serving from each food group.   Stress Management:  -Group instruction provided by verbal instruction, video, and written materials to support subject matter.  Instructors review role of stress in heart disease and how to cope with stress positively.     Exercising on Your Own:  -Group instruction provided by verbal instruction, power point, and written materials to support subject.  Instructors discuss benefits of exercise, components of exercise, frequency and intensity of exercise, and end points for exercise.  Also discuss use of nitroglycerin and activating EMS.  Review options of places to exercise outside of rehab.  Review guidelines  for sex with heart disease.   Cardiac Drugs I:  -Group instruction provided by verbal instruction and written materials to support subject.  Instructor reviews cardiac drug classes: antiplatelets, anticoagulants, beta blockers, and statins.  Instructor discusses reasons, side effects, and lifestyle considerations for each drug class.   Cardiac Drugs II:  -Group instruction provided by verbal instruction and written materials to support subject.  Instructor reviews cardiac drug classes: angiotensin converting enzyme inhibitors (ACE-I), angiotensin II receptor blockers (ARBs), nitrates, and calcium channel blockers.  Instructor discusses reasons, side effects, and lifestyle considerations for each drug class.   Anatomy and Physiology of the Circulatory System:  Group verbal and written instruction and models provide basic cardiac anatomy and physiology, with the coronary electrical and arterial systems. Review of: AMI, Angina, Valve disease, Heart Failure, Peripheral Artery Disease, Cardiac Arrhythmia, Pacemakers, and the ICD.   Other Education:  -Group or individual verbal, written, or video instructions that support the educational goals of the cardiac rehab program.   Holiday Eating Survival Tips:  -Group instruction provided by PowerPoint slides, verbal discussion, and written materials to support subject matter. The instructor gives patients tips, tricks, and techniques to help them not only survive but enjoy the holidays despite the onslaught of food that accompanies the holidays.   Knowledge Questionnaire Score: Knowledge Questionnaire Score - 12/07/17 1053      Knowledge Questionnaire Score   Pre Score  17/24       Core Components/Risk Factors/Patient Goals at Admission: Personal Goals and Risk Factors at Admission - 12/07/17 1124      Core Components/Risk Factors/Patient Goals on Admission    Weight Management  Yes;Obesity    Intervention  Weight Management/Obesity:  Establish reasonable short term and long term weight goals.;Obesity: Provide education and appropriate resources to help participant work on and attain dietary goals.    Admit Weight  272 lb 11.3 oz (123.7 kg)    Expected Outcomes  Short Term: Continue to assess and modify interventions until short term weight is achieved;Long Term: Adherence to nutrition and physical activity/exercise program aimed toward attainment of established weight goal;Weight Loss: Understanding of general recommendations for a balanced deficit meal plan, which promotes 1-2 lb weight loss per week and includes a negative energy balance of [phone] kcal/d    Diabetes  Yes  Intervention  Provide education about signs/symptoms and action to take for hypo/hyperglycemia.;Provide education about proper nutrition, including hydration, and aerobic/resistive exercise prescription along with prescribed medications to achieve blood glucose in normal ranges: Fasting glucose 65-99 mg/dL    Expected Outcomes  Short Term: Participant verbalizes understanding of the signs/symptoms and immediate care of hyper/hypoglycemia, proper foot care and importance of medication, aerobic/resistive exercise and nutrition plan for blood glucose control.;Long Term: Attainment of HbA1C < 7%.    Heart Failure  Yes    Intervention  Provide a combined exercise and nutrition program that is supplemented with education, support and counseling about heart failure. Directed toward relieving symptoms such as shortness of breath, decreased exercise tolerance, and extremity edema.    Expected Outcomes  Improve functional capacity of life;Short term: Attendance in program 2-3 days a week with increased exercise capacity. Reported lower sodium intake. Reported increased fruit and vegetable intake. Reports medication compliance.;Short term: Daily weights obtained and reported for increase. Utilizing diuretic protocols set by physician.;Long term: Adoption of self-care skills  and reduction of barriers for early signs and symptoms recognition and intervention leading to self-care maintenance.    Hypertension  Yes    Intervention  Provide education on lifestyle modifcations including regular physical activity/exercise, weight management, moderate sodium restriction and increased consumption of fresh fruit, vegetables, and low fat dairy, alcohol moderation, and smoking cessation.;Monitor prescription use compliance.    Expected Outcomes  Short Term: Continued assessment and intervention until BP is < 140/22mm HG in hypertensive participants. < 130/23mm HG in hypertensive participants with diabetes, heart failure or chronic kidney disease.;Long Term: Maintenance of blood pressure at goal levels.    Lipids  Yes    Intervention  Provide education and support for participant on nutrition & aerobic/resistive exercise along with prescribed medications to achieve LDL 70mg , HDL >40mg .    Expected Outcomes  Short Term: Participant states understanding of desired cholesterol values and is compliant with medications prescribed. Participant is following exercise prescription and nutrition guidelines.;Long Term: Cholesterol controlled with medications as prescribed, with individualized exercise RX and with personalized nutrition plan. Value goals: LDL < 70mg , HDL > 40 mg.       Core Components/Risk Factors/Patient Goals Review:  Goals and Risk Factor Review    Row Name 12/27/17 1432             Core Components/Risk Factors/Patient Goals Review   Personal Goals Review  Weight Management/Obesity;Heart Failure;Lipids;Hypertension;Diabetes       Review  Pt with multiple CAD RFs willing to participate in CR exercise. "Duck" would like to improve his mobility and increase his pace.        Expected Outcomes  Pt will continue to participate in CR exercise, nutrition, and lifestly modification opportunitites.           Core Components/Risk Factors/Patient Goals at Discharge (Final  Review):  Goals and Risk Factor Review - 12/27/17 1432      Core Components/Risk Factors/Patient Goals Review   Personal Goals Review  Weight Management/Obesity;Heart Failure;Lipids;Hypertension;Diabetes    Review  Pt with multiple CAD RFs willing to participate in CR exercise. "Duck" would like to improve his mobility and increase his pace.     Expected Outcomes  Pt will continue to participate in CR exercise, nutrition, and lifestly modification opportunitites.        ITP Comments: ITP Comments    Row Name 12/06/17 1703 12/27/17 1355         ITP Comments  Dr. Fransico Him, Medical  Director   30 Day ITP Review. Pt started excercise today and tolerated it well.          Comments: See ITP Comments.

## 2018-01-07 ENCOUNTER — Encounter (HOSPITAL_COMMUNITY): Payer: Medicare Other

## 2018-01-10 ENCOUNTER — Encounter (HOSPITAL_COMMUNITY): Payer: Medicare Other

## 2018-01-10 ENCOUNTER — Encounter (HOSPITAL_COMMUNITY)
Admission: RE | Admit: 2018-01-10 | Discharge: 2018-01-10 | Disposition: A | Discharge status: Home / Self Care | Payer: Medicare Other | Source: Ambulatory Visit | Attending: Cardiology | Admitting: Cardiology

## 2018-01-10 DIAGNOSIS — E119 Type 2 diabetes mellitus without complications: Secondary | ICD-10-CM | POA: Diagnosis not present

## 2018-01-10 DIAGNOSIS — I5022 Chronic systolic (congestive) heart failure: Secondary | ICD-10-CM

## 2018-01-12 ENCOUNTER — Encounter (HOSPITAL_COMMUNITY): Payer: Medicare Other

## 2018-01-12 ENCOUNTER — Encounter (HOSPITAL_COMMUNITY)
Admission: RE | Admit: 2018-01-12 | Discharge: 2018-01-12 | Disposition: A | Discharge status: Home / Self Care | Payer: Medicare Other | Source: Ambulatory Visit | Attending: Cardiology | Admitting: Cardiology

## 2018-01-12 DIAGNOSIS — I5022 Chronic systolic (congestive) heart failure: Secondary | ICD-10-CM | POA: Diagnosis not present

## 2018-01-12 DIAGNOSIS — E119 Type 2 diabetes mellitus without complications: Secondary | ICD-10-CM | POA: Diagnosis not present

## 2018-01-12 LAB — GLUCOSE, CAPILLARY: Glucose-Capillary: 121 mg/dL — ABNORMAL HIGH (ref 70–99)

## 2018-01-12 NOTE — Progress Notes (Signed)
[name] 62 y.o. male Nutrition Note Spoke with pt. Nutrition plan and goals reviewed with pt. Pt did not complete MEDFICTS survey, will return first day of cardiac rehab. Pt wants to lose wt. Pt has not actively been trying to lose wt. Wt loss tips reviewed (label reading, eating frequently across the day, portion sizes/serving sizes of foods, how to build a healthy plate). Pt is diabetic. Last A1c indicates blood glucose well-controlled. Pt continues to check CBG's 1-2 times a day. Fasting CBG's reportedly 90-110 mg/dL. Recommended following a consistent carbohydrate heart healthy meal plan, and discussed what this looks like day to day with patient. Pt with dx of CHF. Per discussion, pt does not use canned/convenience foods often. Pt rarely adds salt to food. Pt eats out infrequently. Pt shared he does the majority of the cooking for his house hold, and feels confident in his ability to make changes. Pt shared his family is supportive of any changes that need to be made. Pt expressed understanding of the information reviewed. Pt aware of nutrition education classes offered and plans on attending nutrition classes.  Lab Results  Component Value Date   HGBA1C 5.6 08/04/2017    Wt Readings from Last 3 Encounters:  12/14/17 275 lb (124.7 kg)  12/07/17 272 lb 11.3 oz (123.7 kg)  11/09/17 276 lb 9.6 oz (125.5 kg)    Nutrition Diagnosis  Obesity related to excessive energy intake as evidenced by a Body mass index is 38.85 kg/m. Nutrition Intervention ? Pt's individual nutrition plan reviewed with pt. ? Benefits of adopting Heart Healthy diet discussed when Medficts reviewed.   Goal(s)  Pt to identify and limit food sources of saturated fat, trans fat, and sodium  Pt to identify food quantities necessary to achieve weight loss of 6-24 lbs. at graduation from cardiac rehab.   Plan:   Pt to attend nutrition classes ? Nutrition I ? Nutrition II ? Portion Distortion   Will provide  client-centered nutrition education as part of interdisciplinary care  Monitor and evaluate progress toward nutrition goal with team.    [name], MS, RD, LDN 01/12/2018 11:51 AM

## 2018-01-12 NOTE — Progress Notes (Signed)
Mr. Sarvis reported chest tightness during exercise. He described the tightness as "a pinch that comes and goes."  He says he has had this pain at home and will take a NTG SL with resolution in symptoms. BP 138/82, HR 92, and O2 92%. Weight stable. Duck rested for 2 minutes and then proceeded to the stepper.  Duck did not report any continued chest tightness on the stepper.  Exit vitals 108/78 HR 70. Called [facility] and spoke with triage RN, Chantel. Session notes and Rhythm strips faxed to office.

## 2018-01-14 ENCOUNTER — Encounter (HOSPITAL_COMMUNITY): Payer: Medicare Other

## 2018-01-14 ENCOUNTER — Other Ambulatory Visit: Payer: Self-pay | Admitting: Internal Medicine

## 2018-01-14 NOTE — Telephone Encounter (Signed)
Done erx 

## 2018-01-17 ENCOUNTER — Encounter (HOSPITAL_COMMUNITY): Payer: Medicare Other

## 2018-01-17 ENCOUNTER — Encounter (HOSPITAL_COMMUNITY)
Admission: RE | Admit: 2018-01-17 | Discharge: 2018-01-17 | Disposition: A | Discharge status: Home / Self Care | Payer: Medicare Other | Source: Ambulatory Visit | Attending: Cardiology | Admitting: Cardiology

## 2018-01-17 DIAGNOSIS — I5022 Chronic systolic (congestive) heart failure: Secondary | ICD-10-CM | POA: Diagnosis not present

## 2018-01-17 DIAGNOSIS — E119 Type 2 diabetes mellitus without complications: Secondary | ICD-10-CM | POA: Diagnosis not present

## 2018-01-19 ENCOUNTER — Encounter (HOSPITAL_COMMUNITY): Payer: Medicare Other

## 2018-01-19 ENCOUNTER — Telehealth (HOSPITAL_COMMUNITY): Payer: Self-pay | Admitting: Internal Medicine

## 2018-01-21 ENCOUNTER — Encounter (HOSPITAL_COMMUNITY): Payer: Medicare Other

## 2018-01-24 ENCOUNTER — Encounter (HOSPITAL_COMMUNITY)
Admission: RE | Admit: 2018-01-24 | Discharge: 2018-01-24 | Disposition: A | Discharge status: Home / Self Care | Payer: Medicare Other | Source: Ambulatory Visit | Attending: Cardiology | Admitting: Cardiology

## 2018-01-24 ENCOUNTER — Encounter (HOSPITAL_COMMUNITY): Payer: Medicare Other

## 2018-01-24 DIAGNOSIS — I5022 Chronic systolic (congestive) heart failure: Secondary | ICD-10-CM

## 2018-01-24 DIAGNOSIS — E119 Type 2 diabetes mellitus without complications: Secondary | ICD-10-CM | POA: Diagnosis not present

## 2018-01-24 NOTE — Progress Notes (Signed)
I have reviewed a Home Exercise Prescription with Theron Arista . Elizabeth is not currently exercising at home. The patient was advised to walk 2-3 days a week for 15-30 minutes. Pt is waiting to receive portable oxygen that is more mobile. Once pt gets that squared away, pt plans to walk more in his neighborhood.  Nathaneil Canary and I discussed how to progress their exercise prescription. The patient stated that they understand the exercise prescription.  We reviewed exercise guidelines, target heart rate during exercise, RPE Scale, weather conditions, NTG use, endpoints for exercise, warmup and cool down. Patient is encouraged to come to me with any questions. I will continue to follow up with the patient to assist them with progression and safety.     Carma Lair MS, ACSM CEP' 3:50 PM 01/24/2018

## 2018-01-24 NOTE — Progress Notes (Signed)
Advanced Heart Failure Clinic Note PCP: Primary Cardiologist: Dr. McLean  [facility]   HPI: [name] is a 61 y.o. male with history of longstanding cardiomyopathy out of proportion to CAD/chronic systolic CHF (EF 20-25% 07/2017), prior VT, s/p St Jude ICD, CAD, CVA, OSA on CPAP, pulmonary HTN on O2 at home, PAF on coumadin, HTN, and CKD stage 3.  Admitted from Dr Hochrein's office 10/01/17 with volume overload. HF team was consulted. Underwent R/LHC 10/04/17 - full report below. PYP scan and myeloma panel completed and were not suggestive of cardiac amyloidosis. V/Q scan completed for elevated PA pressures on cath, which was negative. He diuresed 31 lbs with IV lasix and metolazone and transitioned to torsemide 60 mg daily. HF meds were optimized. No ACE/ARB with CKD and soft BPs. Discharge weight 269 pounds.  He presents today for HF follow up. Last visit spiro was increased. Met with CSW about oxygen concetrator. Overall doing really well. Going to cardiac rehab 3x/week. [facility] is meeting him at his house on Thursday to talk about a possible concentrator for O2 take. Now using [facility] for transport so that he is able to take his oxygen tank with him. Denies CP or SOB while exercising at cardiac rehab. No edema or PND. Has chronic 1 pillow orthopnea. Wearing CPAP qHS. No bleeding on coumadin. Energy and appetite decreased. Taking all meds. Weights ~268 lbs at cardiac rehab. Doing better limiting his fluid and salt intake.  ICD interrogation: thoracic impedence above threshold, no VT/VF.  V/Q scan 10/07/17:No appreciable ventilation or perfusion defects.  PYP 10/05/17:Visual and quantitative assessment (grade 0/1, H/CLL equal 1.3) are equivocal for transthyretin amyloidosis.  LHC 10/04/17: Left Main  No significant disease.  Left Anterior Descending  Large, wrap-around LAD with 30% mid vessel stenosis.  Left Circumflex  50-60% distal LCx stenosis.  Right Coronary Artery  50% mid  RCA stenosis.   RHC 10/04/17: RA mean 19 RV 72/23 PA 74/37, mean 51 PCWP mean 19 LV 111/28 AO 112/80 Oxygen saturations: PA 55% AO 95% Cardiac Output (Fick) 4.68  Cardiac Index (Fick) 1.9  PVR 6.8 WU CVP/PCWP 1 PAPi 1.95 1. Nonischemic cardiomyopathy. Coronary disease well out of proportion to degree of cardiomyopathy.  2. Elevated R>L heart filling pressures, suggestive of significant RV failure with high CVP/PCWP ratio and PAPi <2.  3. Low cardiac output.  4. Mixed pulmonary venous/pulmonary arterial hypertension, ?due to hypoxemia from OSA or OHS/OSA.   Review of systems complete and found to be negative unless listed in HPI.   SH:  Social History   Socioeconomic History  . Marital status: Married    Spouse name: Not on file  . Number of children: 2  . Years of education: Not on file  . Highest education level: Not on file  Occupational History  . Occupation: medically retired due to heart failure  Social Needs  . Financial resource strain: Not on file  . Food insecurity:    Worry: Not on file    Inability: Not on file  . Transportation needs:    Medical: Yes    Non-medical: No  Tobacco Use  . Smoking status: Former Smoker    Last attempt to quit: 05/04/1986    Years since quitting: 31.7  . Smokeless tobacco: Never Used  . Tobacco comment: Quit smoking 30 yrs ago. Smoked as teenager less than 1/2 ppd. Smoked x 4 years.  Substance and Sexual Activity  . Alcohol use: No    Comment: occasionally  .   Drug use: No  . Sexual activity: Never  Lifestyle  . Physical activity:    Days per week: 0 days    Minutes per session: 0 min  . Stress: Not at all  Relationships  . Social connections:    Talks on phone: Not on file    Gets together: Not on file    Attends religious service: Not on file    Active member of club or organization: Not on file    Attends meetings of clubs or organizations: Not on file    Relationship status: Not on file  . Intimate partner  violence:    Fear of current or ex partner: Not on file    Emotionally abused: Not on file    Physically abused: Not on file    Forced sexual activity: Not on file  Other Topics Concern  . Not on file  Social History Narrative  . Not on file    FH:  Family History  Problem Relation Age of Onset  . Coronary artery disease Mother   . Heart attack Mother   . Heart attack Maternal Uncle   . Heart attack Maternal Grandmother     Past Medical History:  Diagnosis Date  . CAD (coronary artery disease)    a. Initial nonobst by cath 2009. b. Cath 01/2013 in setting of VT storm: obstructive distal Cx disease (small and terminates in the AV groove, unlikely to cause significant ischemia or electrical instability), nonobstructive RCA disease, EF 15-20%.   . Cerebrovascular accident [facility])    a. 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 Chronic systolic CHF (congestive heart failure) ([place])    a. Likely NICM (out of proportion to CAD). b. 2009 - EF 25-30% by echo, 01/2013: 15-20% by cath. c. 11/2014 Echo: EF 35-40%, Gr1 DD, mild MR, mod TR, PASP 43mHg.  . CKD (chronic kidney disease), stage II   . Dyslipidemia   . Gout   . HTN (hypertension)   . Hypokalemia   . ICD (implantable cardiac defibrillator) in place   . Insulin dependent diabetes mellitus ([place]   . Lipoma   . Nonischemic cardiomyopathy ([place]    a.  11/2014 Echo: EF 35-40%, Gr1 DD, mild MR, mod TR, PASP 476mg.  . OSA (obstructive sleep apnea)    does not wear cpap  . PAF ([place]oxysmal atrial fibrillation) ([place]   a. Noted 05/2008 by EKG;  b. CHA2DS2VASc = 5-6-->coumadin.  . Paroxysmal VT ([facility]   a. s/p St. Jude ICD 2007. b. H/o paroxysmal VT/VF including VT storm 12/2012 admission prompting amio initiation;  c. 01/2013 ICD upgrade SJM [id number] Ellipse VR single lead ICD.  . Marland Kitchenulmonary HTN ([place]   a. Mild by cath 01/2013.    Current Outpatient Medications  Medication Sig Dispense Refill  . acetaminophen (TYLENOL) 500 MG tablet Take  500-1,000 mg by mouth every 6 (six) hours as needed (for bilateral knee pain).    . Marland Kitchenllopurinol (ZYLOPRIM) 300 MG tablet TAKE 1 TABLET BY MOUTH  DAILY 90 tablet 1  . amiodarone (PACERONE) 200 MG tablet Take 1 tablet (200 mg total) by mouth daily. 90 tablet 3  . aspirin EC 81 MG EC tablet Take 1 tablet (81 mg total) by mouth daily. 30 tablet 0  . atorvastatin (LIPITOR) 80 MG tablet Take 1 tablet (80 mg total) by mouth at bedtime. 90 tablet 3  . carvedilol (COREG) 25 MG tablet Take 0.5 tablets (12.5 mg total) by mouth 2 (two) times daily  with a meal. 90 tablet 3  . cholecalciferol (VITAMIN D) 1000 units tablet Take 1,000 Units by mouth daily.    . digoxin (LANOXIN) 0.125 MG tablet Take 1 tablet (0.125 mg total) by mouth every other day. 45 tablet 3  . Insulin Glargine (LANTUS SOLOSTAR) 100 UNIT/ML Solostar Pen INJECT 10 UNITS INTO THE SKIN AT BEDTIME. 15 pen 2  . Insulin Pen Needle (B-D ULTRAFINE III SHORT PEN) 31G X 8 MM MISC USE ONCE DAILY WITH INSULIN 100 each 3  . Insulin Pen Needle (PEN NEEDLES) 31G X 5 MM MISC 100 each by Does not apply route 2 (two) times daily. 100 each 5  . isosorbide-hydrALAZINE (BIDIL) 20-37.5 MG tablet Take 1 tablet by mouth 3 (three) times daily. 270 tablet 3  . LANTUS SOLOSTAR 100 UNIT/ML Solostar Pen INJECT 10 UNITS INTO THE SKIN AT BEDTIME. 15 pen 3  . nitroGLYCERIN (NITROSTAT) 0.4 MG SL tablet PLACE 1 TABLET UNDER TONGUE EVERY 5 MINS X3 DOSES AS NEEDED FOR CHEST PAIN 25 tablet 11  . potassium chloride SA (K-DUR,KLOR-CON) 20 MEQ tablet Take 1 tablet (20 mEq total) by mouth daily. 90 tablet 3  . spironolactone (ALDACTONE) 25 MG tablet Take 1 tablet (25 mg total) by mouth daily. 90 tablet 3  . torsemide (DEMADEX) 20 MG tablet Take 3 tablets (60 mg total) by mouth daily. Take extra 20 mg tablet once daily AS NEEDED for weight gain 3 lbs or more in 24 hrs. (Patient taking differently: Take 20 mg by mouth 3 (three) times daily. Take extra 20 mg tablet once daily AS NEEDED  for weight gain 3 lbs or more in 24 hrs.) 360 tablet 3  . traMADol (ULTRAM) 50 MG tablet Take 1 tablet (50 mg total) by mouth every 8 (eight) hours as needed. 60 tablet 2  . Turmeric 500 MG CAPS Take 1,000 mg by mouth daily as needed (inflammation).     . warfarin (COUMADIN) 5 MG tablet Take 5-10 mg by mouth See admin instructions. Take 31m every day except take 174mMondays and Fridays and take 7.12m36mednesdays     No current facility-administered medications for this encounter.    Vitals:   01/25/18 1124  BP: 122/84  Pulse: 80  SpO2: 94%  Weight: 123.1 kg (271 lb 6.4 oz)   Wt Readings from Last 3 Encounters:  01/25/18 123.1 kg (271 lb 6.4 oz)  12/14/17 124.7 kg (275 lb)  12/07/17 123.7 kg (272 lb 11.3 oz)    PHYSICAL EXAM: General: Well appearing. No resp difficulty. HEENT: Normal Neck: Supple. JVP 5-6. Carotids 2+ bilat; no bruits. No thyromegaly or nodule noted. Cor: PMI nondisplaced. RRR, No M/G/R noted Lungs: CTAB, normal effort. On 2 L O2 Abdomen: Soft, non-tender, non-distended, no HSM. No bruits or masses. +BS  Extremities: No cyanosis, clubbing, or rash. R and LLE no edema.  Neuro: Alert & orientedx3, cranial nerves grossly intact. moves all 4 extremities w/o difficulty. Affect pleasant   ASSESSMENT & PLAN: 1. Chronic Systolic HF due to NICM - St Jude ICD  - ECHO 07/28/2017 EF 20-25%. RV mildly dilated.  - NYHA II-III symptoms - Volume status stable. - Continue 60 mg torsemide daily with extra 20 mg PRN 3 lb weight gain. Has not needed any PRN - Continue carvedilol 12.5 mg twice a day. Will not increase today with decreased appetite and energy - Continue digoxin 0.125 mg daily.  - Continue bidil 1 tab three times a day.  - Continue spiro 25 mg daily. -  Reinforced fluid restriction to < 2 L daily, sodium restriction to less than 2000 mg daily, and the importance of daily weights.    2. CKD stage 3 - Baseline creatinine ~1.9   3. CAD - No s/s ischemia.    -  Continue statin and bb.  - On coumadin. Denies bleeding.  4. PAF  - On coumadin, followed by coumadin clinic on [address].  - Regular on exam.   6. Hx of VT - ICD interrogated today. No VT/VF  7. Pulmonary HTN - Continue home O2 (2L). [facility] is coming to his house Thursday to try to work out whether he can get a concentrator.   BMET today Provided cardiac refills through [facility]. Follow up in 3 months   M , NP  11:34 AM  Greater than 50% of the 25 minute visit was spent in counseling/coordination of care regarding disease state education, salt/fluid restriction, sliding scale diuretics, and medication compliance.  

## 2018-01-25 ENCOUNTER — Ambulatory Visit (HOSPITAL_COMMUNITY)
Admission: RE | Admit: 2018-01-25 | Discharge: 2018-01-25 | Disposition: A | Discharge status: Home / Self Care | Payer: Medicare Other | Source: Ambulatory Visit | Attending: Cardiology | Admitting: Cardiology

## 2018-01-25 VITALS — BP 122/84 | HR 80 | Wt 271.4 lb

## 2018-01-25 DIAGNOSIS — I2510 Atherosclerotic heart disease of native coronary artery without angina pectoris: Secondary | ICD-10-CM | POA: Diagnosis not present

## 2018-01-25 DIAGNOSIS — I5042 Chronic combined systolic (congestive) and diastolic (congestive) heart failure: Secondary | ICD-10-CM | POA: Diagnosis not present

## 2018-01-25 DIAGNOSIS — Z7901 Long term (current) use of anticoagulants: Secondary | ICD-10-CM | POA: Diagnosis not present

## 2018-01-25 DIAGNOSIS — Z794 Long term (current) use of insulin: Secondary | ICD-10-CM | POA: Diagnosis not present

## 2018-01-25 DIAGNOSIS — Z79899 Other long term (current) drug therapy: Secondary | ICD-10-CM | POA: Diagnosis not present

## 2018-01-25 DIAGNOSIS — I130 Hypertensive heart and chronic kidney disease with heart failure and stage 1 through stage 4 chronic kidney disease, or unspecified chronic kidney disease: Secondary | ICD-10-CM | POA: Diagnosis not present

## 2018-01-25 DIAGNOSIS — E785 Hyperlipidemia, unspecified: Secondary | ICD-10-CM | POA: Diagnosis not present

## 2018-01-25 DIAGNOSIS — Z8673 Personal history of transient ischemic attack (TIA), and cerebral infarction without residual deficits: Secondary | ICD-10-CM | POA: Diagnosis not present

## 2018-01-25 DIAGNOSIS — I428 Other cardiomyopathies: Secondary | ICD-10-CM | POA: Diagnosis not present

## 2018-01-25 DIAGNOSIS — I2721 Secondary pulmonary arterial hypertension: Secondary | ICD-10-CM | POA: Insufficient documentation

## 2018-01-25 DIAGNOSIS — Z8249 Family history of ischemic heart disease and other diseases of the circulatory system: Secondary | ICD-10-CM | POA: Insufficient documentation

## 2018-01-25 DIAGNOSIS — I480 Paroxysmal atrial fibrillation: Secondary | ICD-10-CM | POA: Diagnosis not present

## 2018-01-25 DIAGNOSIS — Z7982 Long term (current) use of aspirin: Secondary | ICD-10-CM | POA: Insufficient documentation

## 2018-01-25 DIAGNOSIS — G4733 Obstructive sleep apnea (adult) (pediatric): Secondary | ICD-10-CM | POA: Insufficient documentation

## 2018-01-25 DIAGNOSIS — I472 Ventricular tachycardia, unspecified: Secondary | ICD-10-CM

## 2018-01-25 DIAGNOSIS — Z95810 Presence of automatic (implantable) cardiac defibrillator: Secondary | ICD-10-CM | POA: Insufficient documentation

## 2018-01-25 DIAGNOSIS — M109 Gout, unspecified: Secondary | ICD-10-CM | POA: Diagnosis not present

## 2018-01-25 DIAGNOSIS — Z87891 Personal history of nicotine dependence: Secondary | ICD-10-CM | POA: Insufficient documentation

## 2018-01-25 DIAGNOSIS — N183 Chronic kidney disease, stage 3 unspecified: Secondary | ICD-10-CM

## 2018-01-25 DIAGNOSIS — E1122 Type 2 diabetes mellitus with diabetic chronic kidney disease: Secondary | ICD-10-CM | POA: Diagnosis not present

## 2018-01-25 DIAGNOSIS — Z79891 Long term (current) use of opiate analgesic: Secondary | ICD-10-CM | POA: Diagnosis not present

## 2018-01-25 DIAGNOSIS — I5022 Chronic systolic (congestive) heart failure: Secondary | ICD-10-CM | POA: Insufficient documentation

## 2018-01-25 DIAGNOSIS — Z9981 Dependence on supplemental oxygen: Secondary | ICD-10-CM | POA: Diagnosis not present

## 2018-01-25 DIAGNOSIS — I272 Pulmonary hypertension, unspecified: Secondary | ICD-10-CM

## 2018-01-25 LAB — BASIC METABOLIC PANEL
Anion gap: 9 (ref 5–15)
BUN: 26 mg/dL — AB (ref 8–23)
CHLORIDE: 104 mmol/L (ref 98–111)
CO2: 29 mmol/L (ref 22–32)
CREATININE: 1.9 mg/dL — AB (ref 0.61–1.24)
Calcium: 9.1 mg/dL (ref 8.9–10.3)
GFR calc Af Amer: 42 mL/min — ABNORMAL LOW (ref >60)
GFR calc non Af Amer: 36 mL/min — ABNORMAL LOW (ref >60)
Glucose, Bld: 95 mg/dL (ref 70–99)
Potassium: 4 mmol/L (ref 3.5–5.1)
SODIUM: 142 mmol/L (ref 135–145)

## 2018-01-25 MED ORDER — ALLOPURINOL 300 MG PO TABS: 300.0000 mg | ORAL_TABLET | Freq: Every day | ORAL | 3 refills | Status: DC

## 2018-01-25 MED ORDER — POTASSIUM CHLORIDE CRYS ER 20 MEQ PO TBCR: 20.0000 meq | EXTENDED_RELEASE_TABLET | Freq: Every day | ORAL | 3 refills | Status: DC

## 2018-01-25 MED ORDER — ISOSORB DINITRATE-HYDRALAZINE 20-37.5 MG PO TABS: 1.0000 | ORAL_TABLET | Freq: Three times a day (TID) | ORAL | 3 refills | Status: DC

## 2018-01-25 MED ORDER — DIGOXIN 125 MCG PO TABS: 0.1250 mg | ORAL_TABLET | ORAL | 3 refills | Status: DC

## 2018-01-25 MED ORDER — CARVEDILOL 25 MG PO TABS: 12.5000 mg | ORAL_TABLET | Freq: Two times a day (BID) | ORAL | 3 refills | Status: DC

## 2018-01-25 MED ORDER — AMIODARONE HCL 200 MG PO TABS: 200.0000 mg | ORAL_TABLET | Freq: Every day | ORAL | 3 refills | Status: DC

## 2018-01-25 MED ORDER — TORSEMIDE 20 MG PO TABS: 60.0000 mg | ORAL_TABLET | Freq: Every day | ORAL | 3 refills | Status: DC

## 2018-01-25 MED ORDER — SPIRONOLACTONE 25 MG PO TABS: 25.0000 mg | ORAL_TABLET | Freq: Every day | ORAL | 3 refills | Status: DC

## 2018-01-25 MED ORDER — ATORVASTATIN CALCIUM 80 MG PO TABS: 80.0000 mg | ORAL_TABLET | Freq: Every day | ORAL | 3 refills | Status: DC

## 2018-01-25 NOTE — Patient Instructions (Signed)
Routine lab work today. Will notify you of abnormal results, otherwise no news is good news!  Refills sent to [facility].  Follow up 3 months.  _______________________________________________________________ [name] Code: 0177  Take all medication as prescribed the day of your appointment. Bring all medications with you to your appointment.  Do the following things EVERYDAY: 1) Weigh yourself in the morning before breakfast. Write it down and keep it in a log. 2) Take your medicines as prescribed 3) Eat low salt foods-Limit salt (sodium) to 2000 mg per day.  4) Stay as active as you can everyday 5) Limit all fluids for the day to less than 2 liters

## 2018-01-26 ENCOUNTER — Encounter (HOSPITAL_COMMUNITY): Payer: Medicare Other

## 2018-01-26 ENCOUNTER — Encounter (HOSPITAL_COMMUNITY)
Admission: RE | Admit: 2018-01-26 | Discharge: 2018-01-26 | Disposition: A | Discharge status: Home / Self Care | Payer: Medicare Other | Source: Ambulatory Visit | Attending: Cardiology | Admitting: Cardiology

## 2018-01-26 DIAGNOSIS — I5022 Chronic systolic (congestive) heart failure: Secondary | ICD-10-CM | POA: Diagnosis not present

## 2018-01-26 DIAGNOSIS — E119 Type 2 diabetes mellitus without complications: Secondary | ICD-10-CM | POA: Diagnosis not present

## 2018-01-28 ENCOUNTER — Encounter (HOSPITAL_COMMUNITY): Payer: Medicare Other

## 2018-01-31 ENCOUNTER — Encounter (HOSPITAL_COMMUNITY)
Admission: RE | Admit: 2018-01-31 | Discharge: 2018-01-31 | Disposition: A | Discharge status: Home / Self Care | Payer: Medicare Other | Source: Ambulatory Visit | Attending: Cardiology | Admitting: Cardiology

## 2018-01-31 ENCOUNTER — Encounter (HOSPITAL_COMMUNITY): Payer: Medicare Other

## 2018-01-31 DIAGNOSIS — E119 Type 2 diabetes mellitus without complications: Secondary | ICD-10-CM | POA: Diagnosis not present

## 2018-01-31 DIAGNOSIS — I5022 Chronic systolic (congestive) heart failure: Secondary | ICD-10-CM

## 2018-01-31 DIAGNOSIS — I5043 Acute on chronic combined systolic (congestive) and diastolic (congestive) heart failure: Secondary | ICD-10-CM | POA: Diagnosis not present

## 2018-01-31 LAB — GLUCOSE, CAPILLARY: GLUCOSE-CAPILLARY: 87 mg/dL (ref 70–99)

## 2018-02-01 ENCOUNTER — Ambulatory Visit (INDEPENDENT_AMBULATORY_CARE_PROVIDER_SITE_OTHER): Payer: Medicare Other | Admitting: General Practice

## 2018-02-01 DIAGNOSIS — Z8679 Personal history of other diseases of the circulatory system: Secondary | ICD-10-CM

## 2018-02-01 DIAGNOSIS — I4891 Unspecified atrial fibrillation: Secondary | ICD-10-CM

## 2018-02-01 DIAGNOSIS — Z7901 Long term (current) use of anticoagulants: Secondary | ICD-10-CM

## 2018-02-01 LAB — POCT INR: INR: 2.8 (ref 2.0–3.0)

## 2018-02-01 NOTE — Patient Instructions (Addendum)
Pre visit review using our clinic review tool, if applicable. No additional management support is needed unless otherwise documented below in the visit note.  Take 1 tablet on Sun/Tues/Thurs/Sat. and take 2 tablets on Monday/Fridays and take 1 1/2 tablets on Wednesdays.  Re-check in 4 weeks.

## 2018-02-02 ENCOUNTER — Encounter (HOSPITAL_COMMUNITY): Payer: Medicare Other

## 2018-02-02 ENCOUNTER — Encounter (HOSPITAL_COMMUNITY)
Admission: RE | Admit: 2018-02-02 | Discharge: 2018-02-02 | Disposition: A | Discharge status: Home / Self Care | Payer: Medicare Other | Source: Ambulatory Visit | Attending: Cardiology | Admitting: Cardiology

## 2018-02-02 DIAGNOSIS — E119 Type 2 diabetes mellitus without complications: Secondary | ICD-10-CM | POA: Insufficient documentation

## 2018-02-02 DIAGNOSIS — I5022 Chronic systolic (congestive) heart failure: Secondary | ICD-10-CM | POA: Insufficient documentation

## 2018-02-03 ENCOUNTER — Encounter (HOSPITAL_COMMUNITY): Payer: Self-pay

## 2018-02-03 NOTE — Progress Notes (Signed)
Cardiac Individual Treatment Plan  Patient Details  Name: [name] MRN: [id number] Date of Birth: [birth year] Referring Provider:     [place] from 12/07/2017 in [place]  Referring Provider  Minus Breeding, MD      Initial Encounter Date:    CARDIAC REHAB PHASE II ORIENTATION from 12/07/2017 in [place]  Date  12/07/17      Visit Diagnosis: 9150 Chronic systolic congestive heart failure ([place])  Patient's Home Medications on Admission:  Current Outpatient Medications:  .  acetaminophen (TYLENOL) 500 MG tablet, Take 500-1,000 mg by mouth every 6 (six) hours as needed (for bilateral knee pain)., Disp: , Rfl:  .  allopurinol (ZYLOPRIM) 300 MG tablet, Take 1 tablet (300 mg total) by mouth daily., Disp: 90 tablet, Rfl: 3 .  amiodarone (PACERONE) 200 MG tablet, Take 1 tablet (200 mg total) by mouth daily., Disp: 90 tablet, Rfl: 3 .  aspirin EC 81 MG EC tablet, Take 1 tablet (81 mg total) by mouth daily., Disp: 30 tablet, Rfl: 0 .  atorvastatin (LIPITOR) 80 MG tablet, Take 1 tablet (80 mg total) by mouth at bedtime., Disp: 90 tablet, Rfl: 3 .  carvedilol (COREG) 25 MG tablet, Take 0.5 tablets (12.5 mg total) by mouth 2 (two) times daily with a meal., Disp: 90 tablet, Rfl: 3 .  cholecalciferol (VITAMIN D) 1000 units tablet, Take 1,000 Units by mouth daily., Disp: , Rfl:  .  digoxin (LANOXIN) 0.125 MG tablet, Take 1 tablet (0.125 mg total) by mouth every other day., Disp: 45 tablet, Rfl: 3 .  Insulin Glargine (LANTUS SOLOSTAR) 100 UNIT/ML Solostar Pen, INJECT 10 UNITS INTO THE SKIN AT BEDTIME., Disp: 15 pen, Rfl: 2 .  Insulin Pen Needle (B-D ULTRAFINE III SHORT PEN) 31G X 8 MM MISC, USE ONCE DAILY WITH INSULIN, Disp: 100 each, Rfl: 3 .  Insulin Pen Needle (PEN NEEDLES) 31G X 5 MM MISC, 100 each by Does not apply route 2 (two) times daily., Disp: 100 each, Rfl: 5 .  isosorbide-hydrALAZINE (BIDIL)  20-37.5 MG tablet, Take 1 tablet by mouth 3 (three) times daily., Disp: 270 tablet, Rfl: 3 .  LANTUS SOLOSTAR 100 UNIT/ML Solostar Pen, INJECT 10 UNITS INTO THE SKIN AT BEDTIME., Disp: 15 pen, Rfl: 3 .  nitroGLYCERIN (NITROSTAT) 0.4 MG SL tablet, PLACE 1 TABLET UNDER TONGUE EVERY 5 MINS X3 DOSES AS NEEDED FOR CHEST PAIN, Disp: 25 tablet, Rfl: 11 .  potassium chloride SA (K-DUR,KLOR-CON) 20 MEQ tablet, Take 1 tablet (20 mEq total) by mouth daily., Disp: 90 tablet, Rfl: 3 .  spironolactone (ALDACTONE) 25 MG tablet, Take 1 tablet (25 mg total) by mouth daily., Disp: 90 tablet, Rfl: 3 .  torsemide (DEMADEX) 20 MG tablet, Take 3 tablets (60 mg total) by mouth daily. Take extra 20 mg tablet once daily AS NEEDED for weight gain 3 lbs or more in 24 hrs., Disp: 360 tablet, Rfl: 3 .  traMADol (ULTRAM) 50 MG tablet, Take 1 tablet (50 mg total) by mouth every 8 (eight) hours as needed., Disp: 60 tablet, Rfl: 2 .  Turmeric 500 MG CAPS, Take 1,000 mg by mouth daily as needed (inflammation). , Disp: , Rfl:  .  warfarin (COUMADIN) 5 MG tablet, Take 5-10 mg by mouth See admin instructions. Take 5mg  every day except take 10mg  Mondays and Fridays and take 7.5mg  Wednesdays, Disp: , Rfl:   Past Medical History: Past Medical History:  Diagnosis Date  .  CAD (coronary artery disease)    a. Initial nonobst by cath 2009. b. Cath 01/2013 in setting of VT storm: obstructive distal Cx disease (small and terminates in the AV groove, unlikely to cause significant ischemia or electrical instability), nonobstructive RCA disease, EF 15-20%.   . Cerebrovascular accident [facility])    a. 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 Chronic systolic CHF (congestive heart failure) ([place])    a. Likely NICM (out of proportion to CAD). b. 2009 - EF 25-30% by echo, 01/2013: 15-20% by cath. c. 11/2014 Echo: EF 35-40%, Gr1 DD, mild MR, mod TR, PASP 81mmHg.  . CKD (chronic kidney disease), stage II   . Dyslipidemia   . Gout   . HTN (hypertension)   .  Hypokalemia   . ICD (implantable cardiac defibrillator) in place   . Insulin dependent diabetes mellitus ([place])   . Lipoma   . Nonischemic cardiomyopathy ([place])    a.  11/2014 Echo: EF 35-40%, Gr1 DD, mild MR, mod TR, PASP 59mmHg.  . OSA (obstructive sleep apnea)    does not wear cpap  . PAF (paroxysmal atrial fibrillation) ([place])    a. Noted 05/2008 by EKG;  b. CHA2DS2VASc = 5-6-->coumadin.  . Paroxysmal VT ([place])    a. s/p St. Jude ICD 2007. b. H/o paroxysmal VT/VF including VT storm 12/2012 admission prompting amio initiation;  c. 01/2013 ICD upgrade SJM [id number] Ellipse VR single lead ICD.  [name] Pulmonary HTN ([place])    a. Mild by cath 01/2013.    Tobacco Use: Social History   Tobacco Use  Smoking Status Former Smoker  . Last attempt to quit: 05/04/1986  . Years since quitting: 31.7  Smokeless Tobacco Never Used  Tobacco Comment   Quit smoking 30 yrs ago. Smoked as teenager less than 1/2 ppd. Smoked x 4 years.    Labs: Recent Review Flowsheet Data    Labs for ITP Cardiac and Pulmonary Rehab Latest Ref Rng & Units 01/19/2017 08/04/2017 10/02/2017 10/04/2017 10/04/2017   Cholestrol 0 - 200 mg/dL 141 - 114 - -   LDLCALC 0 - 99 mg/dL 87 - 79 - -   LDLDIRECT mg/dL - - - - -   HDL >40 mg/dL 41.10 - 24(L) - -   Trlycerides <150 mg/dL 69.0 - 55 - -   Hemoglobin A1c - 5.7 5.6 - - -   PHART 7.350 - 7.450 - - - - -   PCO2ART 35.0 - 45.0 mmHg - - - - -   HCO3 20.0 - 28.0 mmol/L - - - 36.7(H) 35.5(H)   TCO2 22 - 32 mmol/L - - - 39(H) 37(H)   ACIDBASEDEF 0.0 - 2.0 mmol/L - - - - -   O2SAT % - - - 55.0 55.0      Capillary Blood Glucose: Lab Results  Component Value Date   GLUCAP 87 01/31/2018   GLUCAP 121 (H) 01/12/2018   GLUCAP 120 (H) 12/27/2017   GLUCAP 107 (H) 12/27/2017   GLUCAP 155 (H) 10/07/2017     Exercise Target Goals: Exercise Program Goal: Individual exercise prescription set using results from initial 6 min walk test and THRR while considering  patient's activity barriers and  safety.   Exercise Prescription Goal: Initial exercise prescription builds to 30-45 minutes a day of aerobic activity, 2-3 days per week.  Home exercise guidelines will be given to patient during program as part of exercise prescription that the participant will acknowledge.  Activity Barriers & Risk Stratification: Activity Barriers & Cardiac  Risk Stratification - 12/07/17 0917      Activity Barriers & Cardiac Risk Stratification   Activity Barriers  Other (comment);Arthritis    Comments  Slight MCL tear- lt knee; arthritis- lt knee; both knees weak.    Cardiac Risk Stratification  High       6 Minute Walk: 6 Minute Walk    Row Name 12/07/17 0845         6 Minute Walk   Phase  Initial     Distance  757 feet     Walk Time  6 minutes     # of Rest Breaks  0     MPH  1.43     METS  1.59     RPE  13     VO2 Peak  5.57     Symptoms  Yes (comment)     Comments  Patient c/o bilateral knee pain.     Resting HR  67 bpm     Resting BP  104/72     Resting Oxygen Saturation   94 % 2L/min O2     Exercise Oxygen Saturation  during 6 min walk  92 % 2L/min O2     Max Ex. HR  100 bpm     Max Ex. BP  106/62     2 Minute Post BP  102/60        Oxygen Initial Assessment:   Oxygen Re-Evaluation:   Oxygen Discharge (Final Oxygen Re-Evaluation):   Initial Exercise Prescription: Initial Exercise Prescription - 12/07/17 1100      Date of Initial Exercise RX and Referring Provider   Date  12/07/17    Referring Provider  Minus Breeding, MD      Oxygen   Oxygen  Continuous    Liters  2      Recumbant Bike   Level  2    Watts  15    Minutes  10    METs  2.39      NuStep   Level  2    SPM  85    Minutes  10    METs  2      Track   Laps  7    Minutes  10    METs  2.23      Prescription Details   Frequency (times per week)  2    Duration  Progress to 30 minutes of continuous aerobic without signs/symptoms of physical distress      Intensity   THRR 40-80% of Max  Heartrate  64-127    Ratings of Perceived Exertion  11-13    Perceived Dyspnea  0-4      Progression   Progression  Continue to progress workloads to maintain intensity without signs/symptoms of physical distress.      Resistance Training   Training Prescription  Yes    Weight  3lbs    Reps  10-15       Perform Capillary Blood Glucose checks as needed.  Exercise Prescription Changes: Exercise Prescription Changes    Row Name 12/27/17 1300 01/17/18 1545 02/02/18 0753         Response to Exercise   Blood Pressure (Admit)  108/60  118/78  98/66     Blood Pressure (Exercise)  114/62  132/64  128/62     Blood Pressure (Exit)  110/74  120/78  108/70     Heart Rate (Admit)  98 bpm  68 bpm  68 bpm  Heart Rate (Exercise)  102 bpm  104 bpm  99 bpm     Heart Rate (Exit)  106 bpm  75 bpm  72 bpm     Oxygen Saturation (Admit)  92 %  -  94 %     Oxygen Saturation (Exercise)  91 %  -  90 %     Oxygen Saturation (Exit)  92 %  91 %  90 %     Rating of Perceived Exertion (Exercise)  13  10  13      Perceived Dyspnea (Exercise)  0  0  0     Symptoms  Pt reported knee pain 6/10  None  None     Comments  Pt oriented to exercise equipment   Added Arm Crank due to Knee Pain on Bike  -     Duration  Progress to 30 minutes of  aerobic without signs/symptoms of physical distress  Progress to 30 minutes of  aerobic without signs/symptoms of physical distress  Continue with 30 min of aerobic exercise without signs/symptoms of physical distress.     Intensity  THRR New  THRR unchanged  -       Progression   Progression  Continue to progress workloads to maintain intensity without signs/symptoms of physical distress.  Continue to progress workloads to maintain intensity without signs/symptoms of physical distress.  Continue to progress workloads to maintain intensity without signs/symptoms of physical distress.     Average METs  2  2.1  2       Resistance Training   Training Prescription  Yes  Yes  -      Weight  3lbs   3lbs   -     Reps  10-15  10-15  -     Time  10 Minutes  10 Minutes  -       Interval Training   Interval Training  No  No  No       Oxygen   Oxygen  -  Continuous  Continuous     Liters  -  2  3       Recumbant Bike   Level  2  -  -     Watts  15  -  -     Minutes  10  -  -     METs  2  -  -       NuStep   Level  2  2  2      SPM  80  80  85     Minutes  10  10  10      METs  1.8  2  1.9       Arm Ergometer   Level  -  2  3     Watts  -  25  25     Minutes  -  10  10     METs  -  2.2  1.7       Track   Laps  7  7  7      Minutes  10  10  10      METs  2.23  2.23  2.22       Home Exercise Plan   Plans to continue exercise at  -  -  Home (comment) Walking     Frequency  -  -  Add 2 additional days to program exercise sessions.     Initial Home Exercises Provided  -  -  01/24/18        Exercise Comments: Exercise Comments    Row Name 12/27/17 1349 01/24/18 1553         Exercise Comments  Pt oriented to exercise equipment. Pt forget to wear knee braces. Pt reported knee pain, but plans to wear braces for future appoinments. Will continue to monitor pt.   Reviewed HEP with pt. Pt verbalized understanding. Will continue to monitor and progress pt.          Exercise Goals and Review: Exercise Goals    Row Name 12/07/17 0915             Exercise Goals   Increase Physical Activity  Yes       Intervention  Provide advice, education, support and counseling about physical activity/exercise needs.;Develop an individualized exercise prescription for aerobic and resistive training based on initial evaluation findings, risk stratification, comorbidities and participant's personal goals.       Expected Outcomes  Short Term: Attend rehab on a regular basis to increase amount of physical activity.;Long Term: Exercising regularly at least 3-5 days a week.;Long Term: Add in home exercise to make exercise part of routine and to increase amount of physical  activity.       Increase Strength and Stamina  Yes       Intervention  Provide advice, education, support and counseling about physical activity/exercise needs.;Develop an individualized exercise prescription for aerobic and resistive training based on initial evaluation findings, risk stratification, comorbidities and participant's personal goals.       Expected Outcomes  Short Term: Increase workloads from initial exercise prescription for resistance, speed, and METs.;Short Term: Perform resistance training exercises routinely during rehab and add in resistance training at home;Long Term: Improve cardiorespiratory fitness, muscular endurance and strength as measured by increased METs and functional capacity (6MWT)       Able to understand and use rate of perceived exertion (RPE) scale  Yes       Intervention  Provide education and explanation on how to use RPE scale       Expected Outcomes  Long Term:  Able to use RPE to guide intensity level when exercising independently;Short Term: Able to use RPE daily in rehab to express subjective intensity level       Able to understand and use Dyspnea scale  Yes       Intervention  Provide education and explanation on how to use Dyspnea scale       Expected Outcomes  Short Term: Able to use Dyspnea scale daily in rehab to express subjective sense of shortness of breath during exertion;Long Term: Able to use Dyspnea scale to guide intensity level when exercising independently       Knowledge and understanding of Target Heart Rate Range (THRR)  Yes       Intervention  Provide education and explanation of THRR including how the numbers were predicted and where they are located for reference       Expected Outcomes  Short Term: Able to state/look up THRR;Long Term: Able to use THRR to govern intensity when exercising independently;Short Term: Able to use daily as guideline for intensity in rehab       Able to check pulse independently  Yes       Intervention   Provide education and demonstration on how to check pulse in carotid and radial arteries.;Review the importance of being able to check your own pulse for safety during independent exercise  Expected Outcomes  Short Term: Able to explain why pulse checking is important during independent exercise;Long Term: Able to check pulse independently and accurately       Understanding of Exercise Prescription  Yes       Intervention  Provide education, explanation, and written materials on patient's individual exercise prescription       Expected Outcomes  Short Term: Able to explain program exercise prescription;Long Term: Able to explain home exercise prescription to exercise independently          Exercise Goals Re-Evaluation : Exercise Goals Re-Evaluation    Row Name 01/24/18 1553             Exercise Goal Re-Evaluation   Exercise Goals Review  Increase Physical Activity;Understanding of Exercise Prescription       Comments  Reviewed HEP with pt. Also reviewed THRR, RPE Scale, weather precautions, endpoints of exercise, NTG use, warmup and cool down.        Expected Outcomes  Pt will plan to walk 2-3 days a week for 15-30 minutes. Pt will continue to increase cardiorespiratory fitness. Will continue to monitor and progress pt as tolerated.           Discharge Exercise Prescription (Final Exercise Prescription Changes): Exercise Prescription Changes - 02/02/18 0753      Response to Exercise   Blood Pressure (Admit)  98/66    Blood Pressure (Exercise)  128/62    Blood Pressure (Exit)  108/70    Heart Rate (Admit)  68 bpm    Heart Rate (Exercise)  99 bpm    Heart Rate (Exit)  72 bpm    Oxygen Saturation (Admit)  94 %    Oxygen Saturation (Exercise)  90 %    Oxygen Saturation (Exit)  90 %    Rating of Perceived Exertion (Exercise)  13    Perceived Dyspnea (Exercise)  0    Symptoms  None    Duration  Continue with 30 min of aerobic exercise without signs/symptoms of physical distress.       Progression   Progression  Continue to progress workloads to maintain intensity without signs/symptoms of physical distress.    Average METs  2      Interval Training   Interval Training  No      Oxygen   Oxygen  Continuous    Liters  3      NuStep   Level  2    SPM  85    Minutes  10    METs  1.9      Arm Ergometer   Level  3    Watts  25    Minutes  10    METs  1.7      Track   Laps  7    Minutes  10    METs  2.22      Home Exercise Plan   Plans to continue exercise at  Home (comment)   Walking   Frequency  Add 2 additional days to program exercise sessions.    Initial Home Exercises Provided  01/24/18       Nutrition:  Target Goals: Understanding of nutrition guidelines, daily intake of sodium 1500mg , cholesterol 200mg , calories 30% from fat and 7% or less from saturated fats, daily to have 5 or more servings of fruits and vegetables.  Biometrics: Pre Biometrics - 12/07/17 0843      Pre Biometrics   Height  5' 10.25" (1.784 m)    Weight  123.7 kg    Waist Circumference  45 inches    Hip Circumference  50.75 inches    Waist to Hip Ratio  0.89 %    BMI (Calculated)  38.87    Triceps Skinfold  33 mm    % Body Fat  36.8 %    Grip Strength  32.5 kg    Flexibility  10.5 in    Single Leg Stand  1.47 seconds        Nutrition Therapy Plan and Nutrition Goals: Nutrition Therapy & Goals - 12/07/17 1218      Nutrition Therapy   Diet  consistent carbohydrate heart healthy      Personal Nutrition Goals   Nutrition Goal  Pt to identify and limit food sources of saturated fat, trans fat, and sodium    Personal Goal #2  Pt to identify food quantities necessary to achieve weight loss of 6-24 lbs. at graduation from cardiac rehab.       Intervention Plan   Intervention  Prescribe, educate and counsel regarding individualized specific dietary modifications aiming towards targeted core components such as weight, hypertension, lipid management, diabetes,  heart failure and other comorbidities.    Expected Outcomes  Short Term Goal: Understand basic principles of dietary content, such as calories, fat, sodium, cholesterol and nutrients.       Nutrition Assessments: Nutrition Assessments - 01/12/18 1153      MEDFICTS Scores   Pre Score  15       Nutrition Goals Re-Evaluation:   Nutrition Goals Re-Evaluation:   Nutrition Goals Discharge (Final Nutrition Goals Re-Evaluation):   Psychosocial: Target Goals: Acknowledge presence or absence of significant depression and/or stress, maximize coping skills, provide positive support system. Participant is able to verbalize types and ability to use techniques and skills needed for reducing stress and depression.  Initial Review & Psychosocial Screening: Initial Psych Review & Screening - 12/07/17 1230      Initial Review   Current issues with  None Identified      Family Dynamics   Good Support System?  Yes   Orland Dec has his wife for support     Barriers   Psychosocial barriers to participate in program  There are no identifiable barriers or psychosocial needs.      Screening Interventions   Interventions  Encouraged to exercise       Quality of Life Scores: Quality of Life - 12/27/17 1358      Quality of Life   Select  Quality of Life      Quality of Life Scores   Health/Function Pre  22.5 %    Socioeconomic Pre  26.75 %    Psych/Spiritual Pre  30 %    Family Pre  30 %    GLOBAL Pre  26 %      Scores of 19 and below usually indicate a poorer quality of life in these areas.  A difference of  2-3 points is a clinically meaningful difference.  A difference of 2-3 points in the total score of the Quality of Life Index has been associated with significant improvement in overall quality of life, self-image, physical symptoms, and general health in studies assessing change in quality of life.  PHQ-9: Recent Review Flowsheet Data    Depression screen [facility] 2/9 12/27/2017 10/28/2017  10/25/2017 08/04/2017 01/19/2017   Decreased Interest 0 0 0 0 1   Down, Depressed, Hopeless 0 0 0 0 0   PHQ - 2 Score 0 0 0  0 1   Altered sleeping - - - - 1   Tired, decreased energy - - - - 1   Change in appetite - - - - 1   Feeling bad or failure about yourself  - - - - 0   Trouble concentrating - - - - 0   Moving slowly or fidgety/restless - - - - 0   Suicidal thoughts - - - - 0   PHQ-9 Score - - - - 4     Interpretation of Total Score  Total Score Depression Severity:  1-4 = Minimal depression, 5-9 = Mild depression, 10-14 = Moderate depression, 15-19 = Moderately severe depression, 20-27 = Severe depression   Psychosocial Evaluation and Intervention: Psychosocial Evaluation - 12/27/17 1408      Psychosocial Evaluation & Interventions   Interventions  Encouraged to exercise with the program and follow exercise prescription    Comments  No psychosocial needs identified. Duck enjoys Lobbyist, watching basketball, and attending outdoot events.     Expected Outcomes  Duck will continue to exhibit a positive outlook.    Continue Psychosocial Services   No Follow up required       Psychosocial Re-Evaluation: Psychosocial Re-Evaluation    [place] Name 01/05/18 1729 02/03/18 1538           Psychosocial Re-Evaluation   Current issues with  None Identified  None Identified      Comments  No psychosocial needs identified.   Working with patient and DME to find oxygen that will work best for patient. Current oxygen is heavy and cumbersome.       Expected Outcomes  Duck will continue to maintain a positive outlook.   Duck will continue to maintain a positive outlook.       Interventions  Encouraged to attend Cardiac Rehabilitation for the exercise  Encouraged to attend Cardiac Rehabilitation for the exercise      Continue Psychosocial Services   No Follow up required  No Follow up required         Psychosocial Discharge (Final Psychosocial Re-Evaluation): Psychosocial Re-Evaluation -  02/03/18 1538      Psychosocial Re-Evaluation   Current issues with  None Identified    Comments  Working with patient and DME to find oxygen that will work best for patient. Current oxygen is heavy and cumbersome.     Expected Outcomes  Duck will continue to maintain a positive outlook.     Interventions  Encouraged to attend Cardiac Rehabilitation for the exercise    Continue Psychosocial Services   No Follow up required       Vocational Rehabilitation: Provide vocational rehab assistance to qualifying candidates.   Vocational Rehab Evaluation & Intervention:   Education: Education Goals: Education classes will be provided on a weekly basis, covering required topics. Participant will state understanding/return demonstration of topics presented.  Learning Barriers/Preferences: Learning Barriers/Preferences - 12/07/17 1133      Learning Barriers/Preferences   Learning Barriers  None    Learning Preferences  Audio;Skilled Demonstration;Verbal Instruction;Written Material;Individual Instruction;Group Instruction       Education Topics: Count Your Pulse:  -Group instruction provided by verbal instruction, demonstration, patient participation and written materials to support subject.  Instructors address importance of being able to find your pulse and how to count your pulse when at home without a heart monitor.  Patients get hands on experience counting their pulse with staff help and individually.   Heart Attack, Angina, and Risk Factor Modification:  -Group instruction  provided by verbal instruction, video, and written materials to support subject.  Instructors address signs and symptoms of angina and heart attacks.    Also discuss risk factors for heart disease and how to make changes to improve heart health risk factors.   CARDIAC REHAB PHASE II EXERCISE from 02/02/2018 in [place]  Date  02/02/18  Instruction Review Code  2- Demonstrated  Understanding      Functional Fitness:  -Group instruction provided by verbal instruction, demonstration, patient participation, and written materials to support subject.  Instructors address safety measures for doing things around the house.  Discuss how to get up and down off the floor, how to pick things up properly, how to safely get out of a chair without assistance, and balance training.   Meditation and Mindfulness:  -Group instruction provided by verbal instruction, patient participation, and written materials to support subject.  Instructor addresses importance of mindfulness and meditation practice to help reduce stress and improve awareness.  Instructor also leads participants through a meditation exercise.    CARDIAC REHAB PHASE II EXERCISE from 12/29/2017 in [place]  Date  12/29/17  Educator  Jeanella Craze  Instruction Review Code  2- Demonstrated Understanding      Stretching for Flexibility and Mobility:  -Group instruction provided by verbal instruction, patient participation, and written materials to support subject.  Instructors lead participants through series of stretches that are designed to increase flexibility thus improving mobility.  These stretches are additional exercise for major muscle groups that are typically performed during regular warm up and cool down.   Hands Only CPR:  -Group verbal, video, and participation provides a basic overview of AHA guidelines for community CPR. Role-play of emergencies allow participants the opportunity to practice calling for help and chest compression technique with discussion of AED use.   Hypertension: -Group verbal and written instruction that provides a basic overview of hypertension including the most recent diagnostic guidelines, risk factor reduction with self-care instructions and medication management.    Nutrition I class: Heart Healthy Eating:  -Group instruction provided by  PowerPoint slides, verbal discussion, and written materials to support subject matter. The instructor gives an explanation and review of the Therapeutic Lifestyle Changes diet recommendations, which includes a discussion on lipid goals, dietary fat, sodium, fiber, plant stanol/sterol esters, sugar, and the components of a well-balanced, healthy diet.   Nutrition II class: Lifestyle Skills:  -Group instruction provided by PowerPoint slides, verbal discussion, and written materials to support subject matter. The instructor gives an explanation and review of label reading, grocery shopping for heart health, heart healthy recipe modifications, and ways to make healthier choices when eating out.   Diabetes Question & Answer:  -Group instruction provided by PowerPoint slides, verbal discussion, and written materials to support subject matter. The instructor gives an explanation and review of diabetes co-morbidities, pre- and post-prandial blood glucose goals, pre-exercise blood glucose goals, signs, symptoms, and treatment of hypoglycemia and hyperglycemia, and foot care basics.   Diabetes Blitz:  -Group instruction provided by PowerPoint slides, verbal discussion, and written materials to support subject matter. The instructor gives an explanation and review of the physiology behind type 1 and type 2 diabetes, diabetes medications and rational behind using different medications, pre- and post-prandial blood glucose recommendations and Hemoglobin A1c goals, diabetes diet, and exercise including blood glucose guidelines for exercising safely.    Portion Distortion:  -Group instruction provided by PowerPoint slides, verbal discussion, written materials,  and food models to support subject matter. The instructor gives an explanation of serving size versus portion size, changes in portions sizes over the last 20 years, and what consists of a serving from each food group.   Stress Management:  -Group  instruction provided by verbal instruction, video, and written materials to support subject matter.  Instructors review role of stress in heart disease and how to cope with stress positively.     Exercising on Your Own:  -Group instruction provided by verbal instruction, power point, and written materials to support subject.  Instructors discuss benefits of exercise, components of exercise, frequency and intensity of exercise, and end points for exercise.  Also discuss use of nitroglycerin and activating EMS.  Review options of places to exercise outside of rehab.  Review guidelines for sex with heart disease.   Cardiac Drugs I:  -Group instruction provided by verbal instruction and written materials to support subject.  Instructor reviews cardiac drug classes: antiplatelets, anticoagulants, beta blockers, and statins.  Instructor discusses reasons, side effects, and lifestyle considerations for each drug class.   CARDIAC REHAB PHASE II EXERCISE from 02/02/2018 in [place]  Date  01/12/18  Instruction Review Code  2- Demonstrated Understanding      Cardiac Drugs II:  -Group instruction provided by verbal instruction and written materials to support subject.  Instructor reviews cardiac drug classes: angiotensin converting enzyme inhibitors (ACE-I), angiotensin II receptor blockers (ARBs), nitrates, and calcium channel blockers.  Instructor discusses reasons, side effects, and lifestyle considerations for each drug class.   Anatomy and Physiology of the Circulatory System:  Group verbal and written instruction and models provide basic cardiac anatomy and physiology, with the coronary electrical and arterial systems. Review of: AMI, Angina, Valve disease, Heart Failure, Peripheral Artery Disease, Cardiac Arrhythmia, Pacemakers, and the ICD.   CARDIAC REHAB PHASE II EXERCISE from 02/02/2018 in [place]  Date  01/26/18  Instruction  Review Code  2- Demonstrated Understanding      Other Education:  -Group or individual verbal, written, or video instructions that support the educational goals of the cardiac rehab program.   Holiday Eating Survival Tips:  -Group instruction provided by PowerPoint slides, verbal discussion, and written materials to support subject matter. The instructor gives patients tips, tricks, and techniques to help them not only survive but enjoy the holidays despite the onslaught of food that accompanies the holidays.   Knowledge Questionnaire Score: Knowledge Questionnaire Score - 12/07/17 1053      Knowledge Questionnaire Score   Pre Score  17/24       Core Components/Risk Factors/Patient Goals at Admission: Personal Goals and Risk Factors at Admission - 12/07/17 1124      Core Components/Risk Factors/Patient Goals on Admission    Weight Management  Yes;Obesity    Intervention  Weight Management/Obesity: Establish reasonable short term and long term weight goals.;Obesity: Provide education and appropriate resources to help participant work on and attain dietary goals.    Admit Weight  272 lb 11.3 oz (123.7 kg)    Expected Outcomes  Short Term: Continue to assess and modify interventions until short term weight is achieved;Long Term: Adherence to nutrition and physical activity/exercise program aimed toward attainment of established weight goal;Weight Loss: Understanding of general recommendations for a balanced deficit meal plan, which promotes 1-2 lb weight loss per week and includes a negative energy balance of [id number] kcal/d    Diabetes  Yes    Intervention  Provide education about signs/symptoms and action to take for hypo/hyperglycemia.;Provide education about proper nutrition, including hydration, and aerobic/resistive exercise prescription along with prescribed medications to achieve blood glucose in normal ranges: Fasting glucose 65-99 mg/dL    Expected Outcomes  Short Term:  Participant verbalizes understanding of the signs/symptoms and immediate care of hyper/hypoglycemia, proper foot care and importance of medication, aerobic/resistive exercise and nutrition plan for blood glucose control.;Long Term: Attainment of HbA1C < 7%.    Heart Failure  Yes    Intervention  Provide a combined exercise and nutrition program that is supplemented with education, support and counseling about heart failure. Directed toward relieving symptoms such as shortness of breath, decreased exercise tolerance, and extremity edema.    Expected Outcomes  Improve functional capacity of life;Short term: Attendance in program 2-3 days a week with increased exercise capacity. Reported lower sodium intake. Reported increased fruit and vegetable intake. Reports medication compliance.;Short term: Daily weights obtained and reported for increase. Utilizing diuretic protocols set by physician.;Long term: Adoption of self-care skills and reduction of barriers for early signs and symptoms recognition and intervention leading to self-care maintenance.    Hypertension  Yes    Intervention  Provide education on lifestyle modifcations including regular physical activity/exercise, weight management, moderate sodium restriction and increased consumption of fresh fruit, vegetables, and low fat dairy, alcohol moderation, and smoking cessation.;Monitor prescription use compliance.    Expected Outcomes  Short Term: Continued assessment and intervention until BP is < 140/22mm HG in hypertensive participants. < 130/38mm HG in hypertensive participants with diabetes, heart failure or chronic kidney disease.;Long Term: Maintenance of blood pressure at goal levels.    Lipids  Yes    Intervention  Provide education and support for participant on nutrition & aerobic/resistive exercise along with prescribed medications to achieve LDL 70mg , HDL >40mg .    Expected Outcomes  Short Term: Participant states understanding of desired  cholesterol values and is compliant with medications prescribed. Participant is following exercise prescription and nutrition guidelines.;Long Term: Cholesterol controlled with medications as prescribed, with individualized exercise RX and with personalized nutrition plan. Value goals: LDL < 70mg , HDL > 40 mg.       Core Components/Risk Factors/Patient Goals Review:  Goals and Risk Factor Review    Row Name 12/27/17 1432 02/03/18 1540           Core Components/Risk Factors/Patient Goals Review   Personal Goals Review  Weight Management/Obesity;Heart Failure;Lipids;Hypertension;Diabetes  Weight Management/Obesity;Heart Failure;Lipids;Hypertension;Diabetes      Review  Pt with multiple CAD RFs willing to participate in CR exercise. "Duck" would like to improve his mobility and increase his pace.   Pt with multiple CAD RFs willing to participate in CR exercise. Orland Dec is doing well with exercise. He is enjoying coming to CR.       Expected Outcomes  Pt will continue to participate in CR exercise, nutrition, and lifestly modification opportunitites.   Pt will continue to participate in CR exercise, nutrition, and lifestly modification opportunitites.          Core Components/Risk Factors/Patient Goals at Discharge (Final Review):  Goals and Risk Factor Review - 02/03/18 1540      Core Components/Risk Factors/Patient Goals Review   Personal Goals Review  Weight Management/Obesity;Heart Failure;Lipids;Hypertension;Diabetes    Review  Pt with multiple CAD RFs willing to participate in CR exercise. Orland Dec is doing well with exercise. He is enjoying coming to CR.     Expected Outcomes  Pt will continue to participate in CR exercise,  nutrition, and lifestly modification opportunitites.        ITP Comments: ITP Comments    Row Name 12/06/17 1703 12/27/17 1355 02/03/18 1537       ITP Comments  Dr. Fransico Him, Medical Director   30 Day ITP Review. Pt started excercise today and tolerated it well.    30 Day ITP Review. Duck continues to tolerate exercise well.  He enjoys coming to CR. Working with patient and DME provider to find an oxygen source that works well with pt.         Comments: See ITP Comments.

## 2018-02-04 ENCOUNTER — Other Ambulatory Visit: Payer: Self-pay | Admitting: Cardiology

## 2018-02-04 ENCOUNTER — Encounter (HOSPITAL_COMMUNITY): Payer: Medicare Other

## 2018-02-07 ENCOUNTER — Encounter (HOSPITAL_COMMUNITY)
Admission: RE | Admit: 2018-02-07 | Discharge: 2018-02-07 | Disposition: A | Discharge status: Home / Self Care | Payer: Medicare Other | Source: Ambulatory Visit | Attending: Cardiology | Admitting: Cardiology

## 2018-02-07 ENCOUNTER — Encounter (HOSPITAL_COMMUNITY): Payer: Medicare Other

## 2018-02-07 DIAGNOSIS — E119 Type 2 diabetes mellitus without complications: Secondary | ICD-10-CM | POA: Diagnosis not present

## 2018-02-07 DIAGNOSIS — I5022 Chronic systolic (congestive) heart failure: Secondary | ICD-10-CM | POA: Diagnosis not present

## 2018-02-08 ENCOUNTER — Encounter: Payer: Self-pay | Admitting: Internal Medicine

## 2018-02-08 ENCOUNTER — Encounter: Payer: Medicare Other | Admitting: *Deleted

## 2018-02-08 ENCOUNTER — Telehealth: Payer: Self-pay | Admitting: Cardiology

## 2018-02-08 ENCOUNTER — Ambulatory Visit (INDEPENDENT_AMBULATORY_CARE_PROVIDER_SITE_OTHER): Payer: Medicare Other | Admitting: Internal Medicine

## 2018-02-08 ENCOUNTER — Other Ambulatory Visit (INDEPENDENT_AMBULATORY_CARE_PROVIDER_SITE_OTHER): Payer: Medicare Other

## 2018-02-08 VITALS — BP 122/76 | HR 66 | Temp 98.2°F | Ht 70.25 in | Wt 267.0 lb

## 2018-02-08 DIAGNOSIS — Z794 Long term (current) use of insulin: Secondary | ICD-10-CM

## 2018-02-08 DIAGNOSIS — I110 Hypertensive heart disease with heart failure: Secondary | ICD-10-CM

## 2018-02-08 DIAGNOSIS — N183 Chronic kidney disease, stage 3 unspecified: Secondary | ICD-10-CM

## 2018-02-08 DIAGNOSIS — E119 Type 2 diabetes mellitus without complications: Secondary | ICD-10-CM | POA: Diagnosis not present

## 2018-02-08 DIAGNOSIS — Z0000 Encounter for general adult medical examination without abnormal findings: Secondary | ICD-10-CM

## 2018-02-08 LAB — HEPATIC FUNCTION PANEL
ALT: 24 U/L (ref 0–53)
AST: 23 U/L (ref 0–37)
Albumin: 3.9 g/dL (ref 3.5–5.2)
Alkaline Phosphatase: 77 U/L (ref 39–117)
BILIRUBIN DIRECT: 0.4 mg/dL — AB (ref 0.0–0.3)
BILIRUBIN TOTAL: 2.1 mg/dL — AB (ref 0.2–1.2)
TOTAL PROTEIN: 7.6 g/dL (ref 6.0–8.3)

## 2018-02-08 LAB — LIPID PANEL
CHOLESTEROL: 137 mg/dL (ref 0–200)
HDL: 33 mg/dL — ABNORMAL LOW (ref >39.00)
LDL Cholesterol: 88 mg/dL (ref 0–99)
NonHDL: 103.85
Total CHOL/HDL Ratio: 4
Triglycerides: 78 mg/dL (ref 0.0–149.0)
VLDL: 15.6 mg/dL (ref 0.0–40.0)

## 2018-02-08 LAB — BASIC METABOLIC PANEL
BUN: 33 mg/dL — AB (ref 6–23)
CALCIUM: 9.4 mg/dL (ref 8.4–10.5)
CO2: 31 mEq/L (ref 19–32)
Chloride: 103 mEq/L (ref 96–112)
Creatinine, Ser: 2.09 mg/dL — ABNORMAL HIGH (ref 0.40–1.50)
GFR: 41.62 mL/min — AB (ref >60.00)
Glucose, Bld: 91 mg/dL (ref 70–99)
Potassium: 4.2 mEq/L (ref 3.5–5.1)
Sodium: 141 mEq/L (ref 135–145)

## 2018-02-08 LAB — HEMOGLOBIN A1C: HEMOGLOBIN A1C: 5.5 % (ref 4.6–6.5)

## 2018-02-08 NOTE — Assessment & Plan Note (Signed)
stable overall by history and exam, recent data reviewed with pt, and pt to continue medical treatment as before,  to f/u any worsening symptoms or concerns  

## 2018-02-08 NOTE — Progress Notes (Signed)
Subjective:    Patient ID: [name], male    DOB: 1955/07/06, 62 y.o.   MRN: [id number]  HPI  Here to f/u; overall doing ok,  Pt denies chest pain, increasing sob or doe, wheezing, orthopnea, PND, increased LE swelling, palpitations, dizziness or syncope.  Pt denies new neurological symptoms such as new headache, or facial or extremity weakness or numbness.  Pt denies polydipsia, polyuria, or low sugar episode.  Pt states overall good compliance with meds, mostly trying to follow appropriate diet, with wt overall stable,  but little exercise however. Flu shot done with last INR oct 1.  No new complaints Past Medical History:  Diagnosis Date  . CAD (coronary artery disease)    a. Initial nonobst by cath 2009. b. Cath 01/2013 in setting of VT storm: obstructive distal Cx disease (small and terminates in the AV groove, unlikely to cause significant ischemia or electrical instability), nonobstructive RCA disease, EF 15-20%.   . Cerebrovascular accident [facility]    a. 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 Chronic systolic CHF (congestive heart failure) ([place])    a. Likely NICM (out of proportion to CAD). b. 2009 - EF 25-30% by echo, 01/2013: 15-20% by cath. c. 11/2014 Echo: EF 35-40%, Gr1 DD, mild MR, mod TR, PASP 6mmHg.  . CKD (chronic kidney disease), stage II   . Dyslipidemia   . Gout   . HTN (hypertension)   . Hypokalemia   . ICD (implantable cardiac defibrillator) in place   . Insulin dependent diabetes mellitus ([place])   . Lipoma   . Nonischemic cardiomyopathy ([place])    a.  11/2014 Echo: EF 35-40%, Gr1 DD, mild MR, mod TR, PASP 27mmHg.  . OSA (obstructive sleep apnea)    does not wear cpap  . PAF (paroxysmal atrial fibrillation) ([place])    a. Noted 05/2008 by EKG;  b. CHA2DS2VASc = 5-6-->coumadin.  . Paroxysmal VT ([place])    a. s/p St. Jude ICD 2007. b. H/o paroxysmal VT/VF including VT storm 12/2012 admission prompting amio initiation;  c. 01/2013 ICD upgrade SJM [id number] [place] VR single lead  ICD.  [name] Pulmonary HTN ([place])    a. Mild by cath 01/2013.   Past Surgical History:  Procedure Laterality Date  . CARDIAC CATHETERIZATION     Nonobstructive coronary disease 2009  . CARDIAC DEFIBRILLATOR PLACEMENT     ICD-St. Jude  . IMPLANTABLE CARDIOVERTER DEFIBRILLATOR (ICD) GENERATOR CHANGE N/A 01/15/2014   Procedure: ICD GENERATOR CHANGE;  Surgeon: Evans Lance, MD;  Location[facility] CATH LAB;  Service: Cardiovascular;  Laterality: N/A;  . LEFT AND RIGHT HEART CATHETERIZATION WITH CORONARY ANGIOGRAM N/A 01/03/2013   Procedure: LEFT AND RIGHT HEART CATHETERIZATION WITH CORONARY ANGIOGRAM;  Surgeon: Peter M Martinique, MD;  Location: [facility] CATH LAB;  Service: Cardiovascular;  Laterality: N/A;  . LIPOMA EXCISION    . RIGHT/LEFT HEART CATH AND CORONARY ANGIOGRAPHY N/A 10/04/2017   Procedure: RIGHT/LEFT HEART CATH AND CORONARY ANGIOGRAPHY;  Surgeon: Larey Dresser, MD;  Location: [place] CV LAB;  Service: Cardiovascular;  Laterality: N/A;    reports that he quit smoking about 31 years ago. He has never used smokeless tobacco. He reports that he does not drink alcohol or use drugs. family history includes Coronary artery disease in his mother; Heart attack in his maternal grandmother, maternal uncle, and mother. No Known Allergies Current Outpatient Medications on File Prior to Visit  Medication Sig Dispense Refill  . acetaminophen (TYLENOL) 500 MG tablet Take 500-1,000 mg by  mouth every 6 (six) hours as needed (for bilateral knee pain).    [name] allopurinol (ZYLOPRIM) 300 MG tablet Take 1 tablet (300 mg total) by mouth daily. 90 tablet 3  . amiodarone (PACERONE) 200 MG tablet Take 1 tablet (200 mg total) by mouth daily. 90 tablet 3  . aspirin EC 81 MG EC tablet Take 1 tablet (81 mg total) by mouth daily. 30 tablet 0  . atorvastatin (LIPITOR) 80 MG tablet Take 1 tablet (80 mg total) by mouth at bedtime. 90 tablet 3  . carvedilol (COREG) 25 MG tablet Take 0.5 tablets (12.5 mg total) by mouth 2 (two) times  daily with a meal. 90 tablet 3  . cholecalciferol (VITAMIN D) 1000 units tablet Take 1,000 Units by mouth daily.    . digoxin (LANOXIN) 0.125 MG tablet Take 1 tablet (0.125 mg total) by mouth every other day. 45 tablet 3  . Insulin Glargine (LANTUS SOLOSTAR) 100 UNIT/ML Solostar Pen INJECT 10 UNITS INTO THE SKIN AT BEDTIME. 15 pen 2  . Insulin Pen Needle (B-D ULTRAFINE III SHORT PEN) 31G X 8 MM MISC USE ONCE DAILY WITH INSULIN 100 each 3  . Insulin Pen Needle (PEN NEEDLES) 31G X 5 MM MISC 100 each by Does not apply route 2 (two) times daily. 100 each 5  . isosorbide-hydrALAZINE (BIDIL) 20-37.5 MG tablet Take 1 tablet by mouth 3 (three) times daily. 270 tablet 3  . LANTUS SOLOSTAR 100 UNIT/ML Solostar Pen INJECT 10 UNITS INTO THE SKIN AT BEDTIME. 15 pen 3  . nitroGLYCERIN (NITROSTAT) 0.4 MG SL tablet PLACE 1 TABLET UNDER TONGUE EVERY 5 MINS X3 DOSES AS NEEDED FOR CHEST PAIN 25 tablet 11  . potassium chloride SA (K-DUR,KLOR-CON) 20 MEQ tablet Take 1 tablet (20 mEq total) by mouth daily. 90 tablet 3  . spironolactone (ALDACTONE) 25 MG tablet Take 1 tablet (25 mg total) by mouth daily. 90 tablet 3  . torsemide (DEMADEX) 20 MG tablet Take 3 tablets (60 mg total) by mouth daily. Take extra 20 mg tablet once daily AS NEEDED for weight gain 3 lbs or more in 24 hrs. 360 tablet 3  . traMADol (ULTRAM) 50 MG tablet Take 1 tablet (50 mg total) by mouth every 8 (eight) hours as needed. 60 tablet 2  . Turmeric 500 MG CAPS Take 1,000 mg by mouth daily as needed (inflammation).     . warfarin (COUMADIN) 5 MG tablet Take 5-10 mg by mouth See admin instructions. Take 5mg  every day except take 10mg  Mondays and Fridays and take 7.5mg  Wednesdays     No current facility-administered medications on file prior to visit.     Review of Systems  Constitutional: Negative for other unusual diaphoresis or sweats HENT: Negative for ear discharge or swelling Eyes: Negative for other worsening visual disturbances Respiratory:  Negative for stridor or other swelling  Gastrointestinal: Negative for worsening distension or other blood Genitourinary: Negative for retention or other urinary change Musculoskeletal: Negative for other MSK pain or swelling Skin: Negative for color change or other new lesions Neurological: Negative for worsening tremors and other numbness  Psychiatric/Behavioral: Negative for worsening agitation or other fatigue All other system neg per pt    Objective:   Physical Exam BP 122/76   Pulse 66   Temp 98.2 F (36.8 C) (Oral)   Ht 5' 10.25" (1.784 m)   Wt 267 lb (121.1 kg)   SpO2 96%   BMI 38.04 kg/m  VS noted,  Constitutional: Pt appears in NAD HENT:  Head: NCAT.  Right Ear: External ear normal.  Left Ear: External ear normal.  Eyes: . Pupils are equal, round, and reactive to light. Conjunctivae and EOM are normal Nose: without d/c or deformity Neck: Neck supple. Gross normal ROM Cardiovascular: Normal rate and regular rhythm.   Pulmonary/Chest: Effort normal and breath sounds without rales or wheezing.  Neurological: Pt is alert. At baseline orientation, motor grossly intact Skin: Skin is warm. No rashes, other new lesions, no LE edema Psychiatric: Pt behavior is normal without agitation  No other exam findings Lab Results  Component Value Date   WBC 6.0 10/07/2017   HGB 13.7 10/07/2017   HCT 45.1 10/07/2017   PLT 202 10/07/2017   GLUCOSE 91 02/08/2018   CHOL 137 02/08/2018   TRIG 78.0 02/08/2018   HDL 33.00 (L) 02/08/2018   LDLDIRECT 194.6 05/09/2007   LDLCALC 88 02/08/2018   ALT 24 02/08/2018   AST 23 02/08/2018   NA 141 02/08/2018   K 4.2 02/08/2018   CL 103 02/08/2018   CREATININE 2.09 (H) 02/08/2018   BUN 33 (H) 02/08/2018   CO2 31 02/08/2018   TSH 0.860 10/01/2017   PSA 0.84 07/15/2016   INR 2.8 02/01/2018   HGBA1C 5.5 02/08/2018   MICROALBUR 4.6 (H) 07/15/2016       Assessment & Plan:

## 2018-02-08 NOTE — Patient Instructions (Signed)

## 2018-02-08 NOTE — Telephone Encounter (Signed)
LMOVM reminding pt to send remote transmission.   

## 2018-02-09 ENCOUNTER — Encounter (HOSPITAL_COMMUNITY): Payer: Medicare Other

## 2018-02-11 ENCOUNTER — Encounter (HOSPITAL_COMMUNITY): Payer: Medicare Other

## 2018-02-14 ENCOUNTER — Encounter (HOSPITAL_COMMUNITY): Payer: Medicare Other

## 2018-02-16 ENCOUNTER — Encounter (HOSPITAL_COMMUNITY): Payer: Medicare Other

## 2018-02-16 ENCOUNTER — Encounter (HOSPITAL_COMMUNITY)
Admission: RE | Admit: 2018-02-16 | Discharge: 2018-02-16 | Disposition: A | Discharge status: Home / Self Care | Payer: Medicare Other | Source: Ambulatory Visit | Attending: Cardiology | Admitting: Cardiology

## 2018-02-16 DIAGNOSIS — I5022 Chronic systolic (congestive) heart failure: Secondary | ICD-10-CM | POA: Diagnosis not present

## 2018-02-16 DIAGNOSIS — E119 Type 2 diabetes mellitus without complications: Secondary | ICD-10-CM | POA: Diagnosis not present

## 2018-02-17 ENCOUNTER — Encounter: Payer: Self-pay | Admitting: Cardiology

## 2018-02-18 ENCOUNTER — Encounter (HOSPITAL_COMMUNITY): Payer: Medicare Other

## 2018-02-21 ENCOUNTER — Encounter (HOSPITAL_COMMUNITY): Payer: Medicare Other

## 2018-02-21 ENCOUNTER — Encounter (HOSPITAL_COMMUNITY)
Admission: RE | Admit: 2018-02-21 | Discharge: 2018-02-21 | Disposition: A | Discharge status: Home / Self Care | Payer: Medicare Other | Source: Ambulatory Visit | Attending: Cardiology | Admitting: Cardiology

## 2018-02-21 DIAGNOSIS — I5022 Chronic systolic (congestive) heart failure: Secondary | ICD-10-CM | POA: Diagnosis not present

## 2018-02-21 DIAGNOSIS — E119 Type 2 diabetes mellitus without complications: Secondary | ICD-10-CM | POA: Diagnosis not present

## 2018-02-21 LAB — GLUCOSE, CAPILLARY: Glucose-Capillary: 124 mg/dL — ABNORMAL HIGH (ref 70–99)

## 2018-02-23 ENCOUNTER — Encounter (HOSPITAL_COMMUNITY): Payer: Medicare Other

## 2018-02-23 ENCOUNTER — Encounter (HOSPITAL_COMMUNITY)
Admission: RE | Admit: 2018-02-23 | Discharge: 2018-02-23 | Disposition: A | Discharge status: Home / Self Care | Payer: Medicare Other | Source: Ambulatory Visit | Attending: Cardiology | Admitting: Cardiology

## 2018-02-23 DIAGNOSIS — E119 Type 2 diabetes mellitus without complications: Secondary | ICD-10-CM | POA: Diagnosis not present

## 2018-02-23 DIAGNOSIS — I5022 Chronic systolic (congestive) heart failure: Secondary | ICD-10-CM

## 2018-02-25 ENCOUNTER — Encounter (HOSPITAL_COMMUNITY): Payer: Medicare Other

## 2018-02-28 ENCOUNTER — Encounter (HOSPITAL_COMMUNITY): Payer: Medicare Other

## 2018-02-28 ENCOUNTER — Ambulatory Visit (INDEPENDENT_AMBULATORY_CARE_PROVIDER_SITE_OTHER): Payer: Medicare Other | Admitting: *Deleted

## 2018-02-28 DIAGNOSIS — I428 Other cardiomyopathies: Secondary | ICD-10-CM

## 2018-02-28 DIAGNOSIS — I472 Ventricular tachycardia, unspecified: Secondary | ICD-10-CM

## 2018-03-01 ENCOUNTER — Ambulatory Visit (INDEPENDENT_AMBULATORY_CARE_PROVIDER_SITE_OTHER): Payer: Medicare Other | Admitting: General Practice

## 2018-03-01 DIAGNOSIS — Z7901 Long term (current) use of anticoagulants: Secondary | ICD-10-CM

## 2018-03-01 DIAGNOSIS — I4891 Unspecified atrial fibrillation: Secondary | ICD-10-CM

## 2018-03-01 DIAGNOSIS — Z8679 Personal history of other diseases of the circulatory system: Secondary | ICD-10-CM

## 2018-03-01 LAB — POCT INR: INR: 2.1 (ref 2.0–3.0)

## 2018-03-01 NOTE — Progress Notes (Signed)
Remote ICD transmission.   

## 2018-03-01 NOTE — Patient Instructions (Addendum)
Pre visit review using our clinic review tool, if applicable. No additional management support is needed unless otherwise documented below in the visit note.  Take 1 tablet on Sun/Tues/Thurs/Sat. and take 2 tablets on Monday/Fridays and take 1 1/2 tablets on Wednesdays.  Re-check in 6 weeks.

## 2018-03-02 ENCOUNTER — Encounter (HOSPITAL_COMMUNITY): Payer: Medicare Other

## 2018-03-02 ENCOUNTER — Telehealth (HOSPITAL_COMMUNITY): Payer: Self-pay | Admitting: Internal Medicine

## 2018-03-02 DIAGNOSIS — I5043 Acute on chronic combined systolic (congestive) and diastolic (congestive) heart failure: Secondary | ICD-10-CM | POA: Diagnosis not present

## 2018-03-03 ENCOUNTER — Encounter (HOSPITAL_COMMUNITY): Payer: Self-pay

## 2018-03-03 DIAGNOSIS — H2513 Age-related nuclear cataract, bilateral: Secondary | ICD-10-CM | POA: Diagnosis not present

## 2018-03-03 DIAGNOSIS — H3562 Retinal hemorrhage, left eye: Secondary | ICD-10-CM | POA: Diagnosis not present

## 2018-03-03 DIAGNOSIS — H35042 Retinal micro-aneurysms, unspecified, left eye: Secondary | ICD-10-CM | POA: Diagnosis not present

## 2018-03-03 DIAGNOSIS — E113392 Type 2 diabetes mellitus with moderate nonproliferative diabetic retinopathy without macular edema, left eye: Secondary | ICD-10-CM | POA: Diagnosis not present

## 2018-03-03 NOTE — Progress Notes (Signed)
Cardiac Individual Treatment Plan  Patient Details  Name: MCCLELLAN DEMARAIS MRN: [id number] Date of Birth: January 23, 1956 Referring Provider:     [place] from 12/07/2017 in [place]  Referring Provider  Minus Breeding, MD      Initial Encounter Date:    CARDIAC REHAB PHASE II ORIENTATION from 12/07/2017 in [place]  Date  12/07/17      Visit Diagnosis: 0277 Chronic systolic congestive heart failure ([place])  Patient's Home Medications on Admission:  Current Outpatient Medications:  .  acetaminophen (TYLENOL) 500 MG tablet, Take 500-1,000 mg by mouth every 6 (six) hours as needed (for bilateral knee pain)., Disp: , Rfl:  .  allopurinol (ZYLOPRIM) 300 MG tablet, Take 1 tablet (300 mg total) by mouth daily., Disp: 90 tablet, Rfl: 3 .  amiodarone (PACERONE) 200 MG tablet, Take 1 tablet (200 mg total) by mouth daily., Disp: 90 tablet, Rfl: 3 .  aspirin EC 81 MG EC tablet, Take 1 tablet (81 mg total) by mouth daily., Disp: 30 tablet, Rfl: 0 .  atorvastatin (LIPITOR) 80 MG tablet, Take 1 tablet (80 mg total) by mouth at bedtime., Disp: 90 tablet, Rfl: 3 .  carvedilol (COREG) 25 MG tablet, Take 0.5 tablets (12.5 mg total) by mouth 2 (two) times daily with a meal., Disp: 90 tablet, Rfl: 3 .  cholecalciferol (VITAMIN D) 1000 units tablet, Take 1,000 Units by mouth daily., Disp: , Rfl:  .  digoxin (LANOXIN) 0.125 MG tablet, Take 1 tablet (0.125 mg total) by mouth every other day., Disp: 45 tablet, Rfl: 3 .  Insulin Glargine (LANTUS SOLOSTAR) 100 UNIT/ML Solostar Pen, INJECT 10 UNITS INTO THE SKIN AT BEDTIME., Disp: 15 pen, Rfl: 2 .  Insulin Pen Needle (B-D ULTRAFINE III SHORT PEN) 31G X 8 MM MISC, USE ONCE DAILY WITH INSULIN, Disp: 100 each, Rfl: 3 .  Insulin Pen Needle (PEN NEEDLES) 31G X 5 MM MISC, 100 each by Does not apply route 2 (two) times daily., Disp: 100 each, Rfl: 5 .  isosorbide-hydrALAZINE (BIDIL)  20-37.5 MG tablet, Take 1 tablet by mouth 3 (three) times daily., Disp: 270 tablet, Rfl: 3 .  LANTUS SOLOSTAR 100 UNIT/ML Solostar Pen, INJECT 10 UNITS INTO THE SKIN AT BEDTIME., Disp: 15 pen, Rfl: 3 .  nitroGLYCERIN (NITROSTAT) 0.4 MG SL tablet, PLACE 1 TABLET UNDER TONGUE EVERY 5 MINS X3 DOSES AS NEEDED FOR CHEST PAIN, Disp: 25 tablet, Rfl: 11 .  potassium chloride SA (K-DUR,KLOR-CON) 20 MEQ tablet, Take 1 tablet (20 mEq total) by mouth daily., Disp: 90 tablet, Rfl: 3 .  spironolactone (ALDACTONE) 25 MG tablet, Take 1 tablet (25 mg total) by mouth daily., Disp: 90 tablet, Rfl: 3 .  torsemide (DEMADEX) 20 MG tablet, Take 3 tablets (60 mg total) by mouth daily. Take extra 20 mg tablet once daily AS NEEDED for weight gain 3 lbs or more in 24 hrs., Disp: 360 tablet, Rfl: 3 .  traMADol (ULTRAM) 50 MG tablet, Take 1 tablet (50 mg total) by mouth every 8 (eight) hours as needed., Disp: 60 tablet, Rfl: 2 .  Turmeric 500 MG CAPS, Take 1,000 mg by mouth daily as needed (inflammation). , Disp: , Rfl:  .  warfarin (COUMADIN) 5 MG tablet, Take 5-10 mg by mouth See admin instructions. Take 5mg  every day except take 10mg  Mondays and Fridays and take 7.5mg  Wednesdays, Disp: , Rfl:   Past Medical History: Past Medical History:  Diagnosis Date  .  CAD (coronary artery disease)    a. Initial nonobst by cath 2009. b. Cath 01/2013 in setting of VT storm: obstructive distal Cx disease (small and terminates in the AV groove, unlikely to cause significant ischemia or electrical instability), nonobstructive RCA disease, EF 15-20%.   . Cerebrovascular accident [facility])    a. 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 Chronic systolic CHF (congestive heart failure) ([place])    a. Likely NICM (out of proportion to CAD). b. 2009 - EF 25-30% by echo, 01/2013: 15-20% by cath. c. 11/2014 Echo: EF 35-40%, Gr1 DD, mild MR, mod TR, PASP 60mmHg.  . CKD (chronic kidney disease), stage II   . Dyslipidemia   . Gout   . HTN (hypertension)   .  Hypokalemia   . ICD (implantable cardiac defibrillator) in place   . Insulin dependent diabetes mellitus ([place])   . Lipoma   . Nonischemic cardiomyopathy ([place])    a.  11/2014 Echo: EF 35-40%, Gr1 DD, mild MR, mod TR, PASP 34mmHg.  . OSA (obstructive sleep apnea)    does not wear cpap  . PAF (paroxysmal atrial fibrillation) ([place])    a. Noted 05/2008 by EKG;  b. CHA2DS2VASc = 5-6-->coumadin.  . Paroxysmal VT ([place])    a. s/p St. Jude ICD 2007. b. H/o paroxysmal VT/VF including VT storm 12/2012 admission prompting amio initiation;  c. 01/2013 ICD upgrade SJM [id number] Ellipse VR single lead ICD.  [name] Pulmonary HTN ([place])    a. Mild by cath 01/2013.    Tobacco Use: Social History   Tobacco Use  Smoking Status Former Smoker  . Last attempt to quit: 05/04/1986  . Years since quitting: 31.8  Smokeless Tobacco Never Used  Tobacco Comment   Quit smoking 30 yrs ago. Smoked as teenager less than 1/2 ppd. Smoked x 4 years.    Labs: Recent Review Flowsheet Data    Labs for ITP Cardiac and Pulmonary Rehab Latest Ref Rng & Units 08/04/2017 10/02/2017 10/04/2017 10/04/2017 02/08/2018   Cholestrol 0 - 200 mg/dL - 114 - - 137   LDLCALC 0 - 99 mg/dL - 79 - - 88   LDLDIRECT mg/dL - - - - -   HDL >39.00 mg/dL - 24(L) - - 33.00(L)   Trlycerides 0.0 - 149.0 mg/dL - 55 - - 78.0   Hemoglobin A1c 4.6 - 6.5 % 5.6 - - - 5.5   PHART 7.350 - 7.450 - - - - -   PCO2ART 35.0 - 45.0 mmHg - - - - -   HCO3 20.0 - 28.0 mmol/L - - 36.7(H) 35.5(H) -   TCO2 22 - 32 mmol/L - - 39(H) 37(H) -   ACIDBASEDEF 0.0 - 2.0 mmol/L - - - - -   O2SAT % - - 55.0 55.0 -      Capillary Blood Glucose: Lab Results  Component Value Date   GLUCAP 124 (H) 02/21/2018   GLUCAP 87 01/31/2018   GLUCAP 121 (H) 01/12/2018   GLUCAP 120 (H) 12/27/2017   GLUCAP 107 (H) 12/27/2017     Exercise Target Goals: Exercise Program Goal: Individual exercise prescription set using results from initial 6 min walk test and THRR while considering  patient's  activity barriers and safety.   Exercise Prescription Goal: Initial exercise prescription builds to 30-45 minutes a day of aerobic activity, 2-3 days per week.  Home exercise guidelines will be given to patient during program as part of exercise prescription that the participant will acknowledge.  Activity Barriers & Risk  Stratification: Activity Barriers & Cardiac Risk Stratification - 12/07/17 0917      Activity Barriers & Cardiac Risk Stratification   Activity Barriers  Other (comment);Arthritis    Comments  Slight MCL tear- lt knee; arthritis- lt knee; both knees weak.    Cardiac Risk Stratification  High       6 Minute Walk: 6 Minute Walk    Row Name 12/07/17 0845         6 Minute Walk   Phase  Initial     Distance  757 feet     Walk Time  6 minutes     # of Rest Breaks  0     MPH  1.43     METS  1.59     RPE  13     VO2 Peak  5.57     Symptoms  Yes (comment)     Comments  Patient c/o bilateral knee pain.     Resting HR  67 bpm     Resting BP  104/72     Resting Oxygen Saturation   94 % 2L/min O2     Exercise Oxygen Saturation  during 6 min walk  92 % 2L/min O2     Max Ex. HR  100 bpm     Max Ex. BP  106/62     2 Minute Post BP  102/60        Oxygen Initial Assessment:   Oxygen Re-Evaluation:   Oxygen Discharge (Final Oxygen Re-Evaluation):   Initial Exercise Prescription: Initial Exercise Prescription - 12/07/17 1100      Date of Initial Exercise RX and Referring Provider   Date  12/07/17    Referring Provider  Minus Breeding, MD      Oxygen   Oxygen  Continuous    Liters  2      Recumbant Bike   Level  2    Watts  15    Minutes  10    METs  2.39      NuStep   Level  2    SPM  85    Minutes  10    METs  2      Track   Laps  7    Minutes  10    METs  2.23      Prescription Details   Frequency (times per week)  2    Duration  Progress to 30 minutes of continuous aerobic without signs/symptoms of physical distress      Intensity    THRR 40-80% of Max Heartrate  64-127    Ratings of Perceived Exertion  11-13    Perceived Dyspnea  0-4      Progression   Progression  Continue to progress workloads to maintain intensity without signs/symptoms of physical distress.      Resistance Training   Training Prescription  Yes    Weight  3lbs    Reps  10-15       Perform Capillary Blood Glucose checks as needed.  Exercise Prescription Changes: Exercise Prescription Changes    Row Name 12/27/17 1300 01/17/18 1545 02/02/18 0753 02/07/18 1044 02/23/18 1444     Response to Exercise   Blood Pressure (Admit)  108/60  118/78  98/66  112/70  114/78   Blood Pressure (Exercise)  114/62  132/64  128/62  122/84  118/78   Blood Pressure (Exit)  110/74  120/78  108/70  96/72  112/70   Heart Rate (Admit)  98 bpm  68 bpm  68 bpm  89 bpm  96 bpm   Heart Rate (Exercise)  102 bpm  104 bpm  99 bpm  106 bpm  124 bpm   Heart Rate (Exit)  106 bpm  75 bpm  72 bpm  83 bpm  94 bpm   Oxygen Saturation (Admit)  92 %  -  94 %  94 %  97 %   Oxygen Saturation (Exercise)  91 %  -  90 %  -  95 %   Oxygen Saturation (Exit)  92 %  91 %  90 %  90 %  -   Rating of Perceived Exertion (Exercise)  13  10  13  12  13    Perceived Dyspnea (Exercise)  0  0  0  0  0   Symptoms  Pt reported knee pain 6/10  None  None  None  None   Comments  Pt oriented to exercise equipment   Added Arm Crank due to Knee Pain on Bike  -  None  None   Duration  Progress to 30 minutes of  aerobic without signs/symptoms of physical distress  Progress to 30 minutes of  aerobic without signs/symptoms of physical distress  Continue with 30 min of aerobic exercise without signs/symptoms of physical distress.  Continue with 30 min of aerobic exercise without signs/symptoms of physical distress.  Continue with 30 min of aerobic exercise without signs/symptoms of physical distress.   Intensity  THRR New  THRR unchanged  -  THRR unchanged  THRR unchanged     Progression   Progression   Continue to progress workloads to maintain intensity without signs/symptoms of physical distress.  Continue to progress workloads to maintain intensity without signs/symptoms of physical distress.  Continue to progress workloads to maintain intensity without signs/symptoms of physical distress.  Continue to progress workloads to maintain intensity without signs/symptoms of physical distress.  Continue to progress workloads to maintain intensity without signs/symptoms of physical distress.   Average METs  2  2.1  2  2   2.04     Resistance Training   Training Prescription  Yes  Yes  -  Yes  No   Weight  3lbs   3lbs   -  3lbs  -   Reps  10-15  10-15  -  10-15  -   Time  10 Minutes  10 Minutes  -  10 Minutes  -     Interval Training   Interval Training  No  No  No  No  No     Oxygen   Oxygen  -  Continuous  Continuous  Continuous  Continuous   Liters  -  2  3  3  3      Recumbant Bike   Level  2  -  -  -  -   Watts  15  -  -  -  -   Minutes  10  -  -  -  -   METs  2  -  -  -  -     NuStep   Level  2  2  2  2  2    SPM  80  80  85  85  95   Minutes  10  10  10  10  10    METs  1.8  2  1.9  2.1  1.8     Arm Ergometer   Level  -  2  3  3  3    Watts  -  25  25  25  25    Minutes  -  10  10  10  10    METs  -  2.2  1.7  1.7  2.1     Track   Laps  7  7  7  7  4    Minutes  10  10  10  10  10    METs  2.23  2.23  2.22  2.22  0.9     Home Exercise Plan   Plans to continue exercise at  -  -  Home (comment) Walking  Home (comment) Walking  Home (comment) Walking   Frequency  -  -  Add 2 additional days to program exercise sessions.  Add 2 additional days to program exercise sessions.  Add 2 additional days to program exercise sessions.   Initial Home Exercises Provided  -  -  01/24/18  01/24/18  01/24/18      Exercise Comments: Exercise Comments    Row Name 12/27/17 1349 01/24/18 1553 03/03/18 1446       Exercise Comments  Pt oriented to exercise equipment. Pt forget to wear knee  braces. Pt reported knee pain, but plans to wear braces for future appoinments. Will continue to monitor pt.   Reviewed HEP with pt. Pt verbalized understanding. Will continue to monitor and progress pt.   Reviewed METs and Goals with pt. Pt has been out of rehab due to knee pain. Will continue to monitor pt and pain.         Exercise Goals and Review: Exercise Goals    Row Name 12/07/17 0915             Exercise Goals   Increase Physical Activity  Yes       Intervention  Provide advice, education, support and counseling about physical activity/exercise needs.;Develop an individualized exercise prescription for aerobic and resistive training based on initial evaluation findings, risk stratification, comorbidities and participant's personal goals.       Expected Outcomes  Short Term: Attend rehab on a regular basis to increase amount of physical activity.;Long Term: Exercising regularly at least 3-5 days a week.;Long Term: Add in home exercise to make exercise part of routine and to increase amount of physical activity.       Increase Strength and Stamina  Yes       Intervention  Provide advice, education, support and counseling about physical activity/exercise needs.;Develop an individualized exercise prescription for aerobic and resistive training based on initial evaluation findings, risk stratification, comorbidities and participant's personal goals.       Expected Outcomes  Short Term: Increase workloads from initial exercise prescription for resistance, speed, and METs.;Short Term: Perform resistance training exercises routinely during rehab and add in resistance training at home;Long Term: Improve cardiorespiratory fitness, muscular endurance and strength as measured by increased METs and functional capacity (6MWT)       Able to understand and use rate of perceived exertion (RPE) scale  Yes       Intervention  Provide education and explanation on how to use RPE scale       Expected  Outcomes  Long Term:  Able to use RPE to guide intensity level when exercising independently;Short Term: Able to use RPE daily in rehab to express subjective intensity level       Able to understand and use Dyspnea scale  Yes  Intervention  Provide education and explanation on how to use Dyspnea scale       Expected Outcomes  Short Term: Able to use Dyspnea scale daily in rehab to express subjective sense of shortness of breath during exertion;Long Term: Able to use Dyspnea scale to guide intensity level when exercising independently       Knowledge and understanding of Target Heart Rate Range (THRR)  Yes       Intervention  Provide education and explanation of THRR including how the numbers were predicted and where they are located for reference       Expected Outcomes  Short Term: Able to state/look up THRR;Long Term: Able to use THRR to govern intensity when exercising independently;Short Term: Able to use daily as guideline for intensity in rehab       Able to check pulse independently  Yes       Intervention  Provide education and demonstration on how to check pulse in carotid and radial arteries.;Review the importance of being able to check your own pulse for safety during independent exercise       Expected Outcomes  Short Term: Able to explain why pulse checking is important during independent exercise;Long Term: Able to check pulse independently and accurately       Understanding of Exercise Prescription  Yes       Intervention  Provide education, explanation, and written materials on patient's individual exercise prescription       Expected Outcomes  Short Term: Able to explain program exercise prescription;Long Term: Able to explain home exercise prescription to exercise independently          Exercise Goals Re-Evaluation : Exercise Goals Re-Evaluation    [place] Name 01/24/18 1553 03/03/18 1449           Exercise Goal Re-Evaluation   Exercise Goals Review  Increase Physical  Activity;Understanding of Exercise Prescription  Understanding of Exercise Prescription;Increase Physical Activity      Comments  Reviewed HEP with pt. Also reviewed THRR, RPE Scale, weather precautions, endpoints of exercise, NTG use, warmup and cool down.   Reviewed METs and goals with pt. Pt is able to exercise 30 minutes with minimal problems. Pt has been out of rehab for a week due to chronic knee pain. Will follow up with pt to see if he is getting better.       Expected Outcomes  Pt will plan to walk 2-3 days a week for 15-30 minutes. Pt will continue to increase cardiorespiratory fitness. Will continue to monitor and progress pt as tolerated.   Pt has not been walking at home for exercise. Pt states he is limited by oxygen tank and knee pain. Pt is on waiting list for new oxygen tank that will allow him to be more mobile.          Discharge Exercise Prescription (Final Exercise Prescription Changes): Exercise Prescription Changes - 02/23/18 1444      Response to Exercise   Blood Pressure (Admit)  114/78    Blood Pressure (Exercise)  118/78    Blood Pressure (Exit)  112/70    Heart Rate (Admit)  96 bpm    Heart Rate (Exercise)  124 bpm    Heart Rate (Exit)  94 bpm    Oxygen Saturation (Admit)  97 %    Oxygen Saturation (Exercise)  95 %    Rating of Perceived Exertion (Exercise)  13    Perceived Dyspnea (Exercise)  0    Symptoms  None    Comments  None    Duration  Continue with 30 min of aerobic exercise without signs/symptoms of physical distress.    Intensity  THRR unchanged      Progression   Progression  Continue to progress workloads to maintain intensity without signs/symptoms of physical distress.    Average METs  2.04      Resistance Training   Training Prescription  No      Interval Training   Interval Training  No      Oxygen   Oxygen  Continuous    Liters  3      NuStep   Level  2    SPM  95    Minutes  10    METs  1.8      Arm Ergometer   Level  3     Watts  25    Minutes  10    METs  2.1      Track   Laps  4    Minutes  10    METs  0.9      Home Exercise Plan   Plans to continue exercise at  Home (comment)   Walking   Frequency  Add 2 additional days to program exercise sessions.    Initial Home Exercises Provided  01/24/18       Nutrition:  Target Goals: Understanding of nutrition guidelines, daily intake of sodium 1500mg , cholesterol 200mg , calories 30% from fat and 7% or less from saturated fats, daily to have 5 or more servings of fruits and vegetables.  Biometrics: Pre Biometrics - 12/07/17 0843      Pre Biometrics   Height  5' 10.25" (1.784 m)    Weight  123.7 kg    Waist Circumference  45 inches    Hip Circumference  50.75 inches    Waist to Hip Ratio  0.89 %    BMI (Calculated)  38.87    Triceps Skinfold  33 mm    % Body Fat  36.8 %    Grip Strength  32.5 kg    Flexibility  10.5 in    Single Leg Stand  1.47 seconds        Nutrition Therapy Plan and Nutrition Goals: Nutrition Therapy & Goals - 12/07/17 1218      Nutrition Therapy   Diet  consistent carbohydrate heart healthy      Personal Nutrition Goals   Nutrition Goal  Pt to identify and limit food sources of saturated fat, trans fat, and sodium    Personal Goal #2  Pt to identify food quantities necessary to achieve weight loss of 6-24 lbs. at graduation from cardiac rehab.       Intervention Plan   Intervention  Prescribe, educate and counsel regarding individualized specific dietary modifications aiming towards targeted core components such as weight, hypertension, lipid management, diabetes, heart failure and other comorbidities.    Expected Outcomes  Short Term Goal: Understand basic principles of dietary content, such as calories, fat, sodium, cholesterol and nutrients.       Nutrition Assessments: Nutrition Assessments - 01/12/18 1153      MEDFICTS Scores   Pre Score  15       Nutrition Goals Re-Evaluation:   Nutrition Goals  Re-Evaluation:   Nutrition Goals Discharge (Final Nutrition Goals Re-Evaluation):   Psychosocial: Target Goals: Acknowledge presence or absence of significant depression and/or stress, maximize coping skills, provide positive support system. Participant is able to verbalize types and  ability to use techniques and skills needed for reducing stress and depression.  Initial Review & Psychosocial Screening: Initial Psych Review & Screening - 12/07/17 1230      Initial Review   Current issues with  None Identified      Family Dynamics   Good Support System?  Yes   Orland Dec has his wife for support     Barriers   Psychosocial barriers to participate in program  There are no identifiable barriers or psychosocial needs.      Screening Interventions   Interventions  Encouraged to exercise       Quality of Life Scores: Quality of Life - 12/27/17 1358      Quality of Life   Select  Quality of Life      Quality of Life Scores   Health/Function Pre  22.5 %    Socioeconomic Pre  26.75 %    Psych/Spiritual Pre  30 %    Family Pre  30 %    GLOBAL Pre  26 %      Scores of 19 and below usually indicate a poorer quality of life in these areas.  A difference of  2-3 points is a clinically meaningful difference.  A difference of 2-3 points in the total score of the Quality of Life Index has been associated with significant improvement in overall quality of life, self-image, physical symptoms, and general health in studies assessing change in quality of life.  PHQ-9: Recent Review Flowsheet Data    Depression screen [facility] 2/9 12/27/2017 10/28/2017 10/25/2017 08/04/2017 01/19/2017   Decreased Interest 0 0 0 0 1   Down, Depressed, Hopeless 0 0 0 0 0   PHQ - 2 Score 0 0 0 0 1   Altered sleeping - - - - 1   Tired, decreased energy - - - - 1   Change in appetite - - - - 1   Feeling bad or failure about yourself  - - - - 0   Trouble concentrating - - - - 0   Moving slowly or fidgety/restless - - - - 0    Suicidal thoughts - - - - 0   PHQ-9 Score - - - - 4     Interpretation of Total Score  Total Score Depression Severity:  1-4 = Minimal depression, 5-9 = Mild depression, 10-14 = Moderate depression, 15-19 = Moderately severe depression, 20-27 = Severe depression   Psychosocial Evaluation and Intervention: Psychosocial Evaluation - 12/27/17 1408      Psychosocial Evaluation & Interventions   Interventions  Encouraged to exercise with the program and follow exercise prescription    Comments  No psychosocial needs identified. Duck enjoys Lobby[employer], watching basketball, and attending outdoot events.     Expected Outcomes  Duck will continue to exhibit a positive outlook.    Continue Psychosocial Services   No Follow up required       Psychosocial Re-Evaluation: Psychosocial Re-Evaluation    [place] Name 01/05/18 1729 02/03/18 1538 03/03/18 1113         Psychosocial Re-Evaluation   Current issues with  None Identified  None Identified  None Identified     Comments  No psychosocial needs identified.   Working with patient and DME to find oxygen that will work best for patient. Current oxygen is heavy and cumbersome.   Working with patient and DME to find oxygen that will work best for patient. Current oxygen is heavy and cumbersome.      Expected  Outcomes  Duck will continue to maintain a positive outlook.   Duck will continue to maintain a positive outlook.   Duck will continue to maintain a positive outlook.      Interventions  Encouraged to attend Cardiac Rehabilitation for the exercise  Encouraged to attend Cardiac Rehabilitation for the exercise  Encouraged to attend Cardiac Rehabilitation for the exercise     Continue Psychosocial Services   No Follow up required  No Follow up required  No Follow up required        Psychosocial Discharge (Final Psychosocial Re-Evaluation): Psychosocial Re-Evaluation - 03/03/18 1113      Psychosocial Re-Evaluation   Current issues with  None  Identified    Comments  Working with patient and DME to find oxygen that will work best for patient. Current oxygen is heavy and cumbersome.     Expected Outcomes  Duck will continue to maintain a positive outlook.     Interventions  Encouraged to attend Cardiac Rehabilitation for the exercise    Continue Psychosocial Services   No Follow up required       Vocational Rehabilitation: Provide vocational rehab assistance to qualifying candidates.   Vocational Rehab Evaluation & Intervention:   Education: Education Goals: Education classes will be provided on a weekly basis, covering required topics. Participant will state understanding/return demonstration of topics presented.  Learning Barriers/Preferences: Learning Barriers/Preferences - 12/07/17 1133      Learning Barriers/Preferences   Learning Barriers  None    Learning Preferences  Audio;Skilled Demonstration;Verbal Instruction;Written Material;Individual Instruction;Group Instruction       Education Topics: Count Your Pulse:  -Group instruction provided by verbal instruction, demonstration, patient participation and written materials to support subject.  Instructors address importance of being able to find your pulse and how to count your pulse when at home without a heart monitor.  Patients get hands on experience counting their pulse with staff help and individually.   Heart Attack, Angina, and Risk Factor Modification:  -Group instruction provided by verbal instruction, video, and written materials to support subject.  Instructors address signs and symptoms of angina and heart attacks.    Also discuss risk factors for heart disease and how to make changes to improve heart health risk factors.   CARDIAC REHAB PHASE II EXERCISE from 02/16/2018 in [place]  Date  02/02/18  Instruction Review Code  2- Demonstrated Understanding      Functional Fitness:  -Group instruction provided by verbal  instruction, demonstration, patient participation, and written materials to support subject.  Instructors address safety measures for doing things around the house.  Discuss how to get up and down off the floor, how to pick things up properly, how to safely get out of a chair without assistance, and balance training.   Meditation and Mindfulness:  -Group instruction provided by verbal instruction, patient participation, and written materials to support subject.  Instructor addresses importance of mindfulness and meditation practice to help reduce stress and improve awareness.  Instructor also leads participants through a meditation exercise.    CARDIAC REHAB PHASE II EXERCISE from 12/29/2017 in [place]  Date  12/29/17  Educator  Jeanella Craze  Instruction Review Code  2- Demonstrated Understanding      Stretching for Flexibility and Mobility:  -Group instruction provided by verbal instruction, patient participation, and written materials to support subject.  Instructors lead participants through series of stretches that are designed to increase flexibility thus improving mobility.  These stretches are additional exercise for major muscle groups that are typically performed during regular warm up and cool down.   Hands Only CPR:  -Group verbal, video, and participation provides a basic overview of AHA guidelines for community CPR. Role-play of emergencies allow participants the opportunity to practice calling for help and chest compression technique with discussion of AED use.   Hypertension: -Group verbal and written instruction that provides a basic overview of hypertension including the most recent diagnostic guidelines, risk factor reduction with self-care instructions and medication management.    Nutrition I class: Heart Healthy Eating:  -Group instruction provided by PowerPoint slides, verbal discussion, and written materials to support subject matter.  The instructor gives an explanation and review of the Therapeutic Lifestyle Changes diet recommendations, which includes a discussion on lipid goals, dietary fat, sodium, fiber, plant stanol/sterol esters, sugar, and the components of a well-balanced, healthy diet.   Nutrition II class: Lifestyle Skills:  -Group instruction provided by PowerPoint slides, verbal discussion, and written materials to support subject matter. The instructor gives an explanation and review of label reading, grocery shopping for heart health, heart healthy recipe modifications, and ways to make healthier choices when eating out.   Diabetes Question & Answer:  -Group instruction provided by PowerPoint slides, verbal discussion, and written materials to support subject matter. The instructor gives an explanation and review of diabetes co-morbidities, pre- and post-prandial blood glucose goals, pre-exercise blood glucose goals, signs, symptoms, and treatment of hypoglycemia and hyperglycemia, and foot care basics.   Diabetes Blitz:  -Group instruction provided by PowerPoint slides, verbal discussion, and written materials to support subject matter. The instructor gives an explanation and review of the physiology behind type 1 and type 2 diabetes, diabetes medications and rational behind using different medications, pre- and post-prandial blood glucose recommendations and Hemoglobin A1c goals, diabetes diet, and exercise including blood glucose guidelines for exercising safely.    Portion Distortion:  -Group instruction provided by PowerPoint slides, verbal discussion, written materials, and food models to support subject matter. The instructor gives an explanation of serving size versus portion size, changes in portions sizes over the last 20 years, and what consists of a serving from each food group.   Stress Management:  -Group instruction provided by verbal instruction, video, and written materials to support subject  matter.  Instructors review role of stress in heart disease and how to cope with stress positively.     Exercising on Your Own:  -Group instruction provided by verbal instruction, power point, and written materials to support subject.  Instructors discuss benefits of exercise, components of exercise, frequency and intensity of exercise, and end points for exercise.  Also discuss use of nitroglycerin and activating EMS.  Review options of places to exercise outside of rehab.  Review guidelines for sex with heart disease.   CARDIAC REHAB PHASE II EXERCISE from 02/16/2018 in [place]  Date  02/16/18  Educator  EP  Instruction Review Code  2- Demonstrated Understanding      Cardiac Drugs I:  -Group instruction provided by verbal instruction and written materials to support subject.  Instructor reviews cardiac drug classes: antiplatelets, anticoagulants, beta blockers, and statins.  Instructor discusses reasons, side effects, and lifestyle considerations for each drug class.   CARDIAC REHAB PHASE II EXERCISE from 02/16/2018 in [place]  Date  01/12/18  Instruction Review Code  2- Demonstrated Understanding      Cardiac Drugs II:  -Group  instruction provided by verbal instruction and written materials to support subject.  Instructor reviews cardiac drug classes: angiotensin converting enzyme inhibitors (ACE-I), angiotensin II receptor blockers (ARBs), nitrates, and calcium channel blockers.  Instructor discusses reasons, side effects, and lifestyle considerations for each drug class.   Anatomy and Physiology of the Circulatory System:  Group verbal and written instruction and models provide basic cardiac anatomy and physiology, with the coronary electrical and arterial systems. Review of: AMI, Angina, Valve disease, Heart Failure, Peripheral Artery Disease, Cardiac Arrhythmia, Pacemakers, and the ICD.   CARDIAC REHAB PHASE II  EXERCISE from 02/16/2018 in [place]  Date  01/26/18  Instruction Review Code  2- Demonstrated Understanding      Other Education:  -Group or individual verbal, written, or video instructions that support the educational goals of the cardiac rehab program.   Holiday Eating Survival Tips:  -Group instruction provided by PowerPoint slides, verbal discussion, and written materials to support subject matter. The instructor gives patients tips, tricks, and techniques to help them not only survive but enjoy the holidays despite the onslaught of food that accompanies the holidays.   Knowledge Questionnaire Score: Knowledge Questionnaire Score - 12/07/17 1053      Knowledge Questionnaire Score   Pre Score  17/24       Core Components/Risk Factors/Patient Goals at Admission: Personal Goals and Risk Factors at Admission - 12/07/17 1124      Core Components/Risk Factors/Patient Goals on Admission    Weight Management  Yes;Obesity    Intervention  Weight Management/Obesity: Establish reasonable short term and long term weight goals.;Obesity: Provide education and appropriate resources to help participant work on and attain dietary goals.    Admit Weight  272 lb 11.3 oz (123.7 kg)    Expected Outcomes  Short Term: Continue to assess and modify interventions until short term weight is achieved;Long Term: Adherence to nutrition and physical activity/exercise program aimed toward attainment of established weight goal;Weight Loss: Understanding of general recommendations for a balanced deficit meal plan, which promotes 1-2 lb weight loss per week and includes a negative energy balance of [phone] kcal/d    Diabetes  Yes    Intervention  Provide education about signs/symptoms and action to take for hypo/hyperglycemia.;Provide education about proper nutrition, including hydration, and aerobic/resistive exercise prescription along with prescribed medications to achieve  blood glucose in normal ranges: Fasting glucose 65-99 mg/dL    Expected Outcomes  Short Term: Participant verbalizes understanding of the signs/symptoms and immediate care of hyper/hypoglycemia, proper foot care and importance of medication, aerobic/resistive exercise and nutrition plan for blood glucose control.;Long Term: Attainment of HbA1C < 7%.    Heart Failure  Yes    Intervention  Provide a combined exercise and nutrition program that is supplemented with education, support and counseling about heart failure. Directed toward relieving symptoms such as shortness of breath, decreased exercise tolerance, and extremity edema.    Expected Outcomes  Improve functional capacity of life;Short term: Attendance in program 2-3 days a week with increased exercise capacity. Reported lower sodium intake. Reported increased fruit and vegetable intake. Reports medication compliance.;Short term: Daily weights obtained and reported for increase. Utilizing diuretic protocols set by physician.;Long term: Adoption of self-care skills and reduction of barriers for early signs and symptoms recognition and intervention leading to self-care maintenance.    Hypertension  Yes    Intervention  Provide education on lifestyle modifcations including regular physical activity/exercise, weight management, moderate sodium restriction and increased consumption of fresh  fruit, vegetables, and low fat dairy, alcohol moderation, and smoking cessation.;Monitor prescription use compliance.    Expected Outcomes  Short Term: Continued assessment and intervention until BP is < 140/102mm HG in hypertensive participants. < 130/42mm HG in hypertensive participants with diabetes, heart failure or chronic kidney disease.;Long Term: Maintenance of blood pressure at goal levels.    Lipids  Yes    Intervention  Provide education and support for participant on nutrition & aerobic/resistive exercise along with prescribed medications to achieve LDL  70mg , HDL >40mg .    Expected Outcomes  Short Term: Participant states understanding of desired cholesterol values and is compliant with medications prescribed. Participant is following exercise prescription and nutrition guidelines.;Long Term: Cholesterol controlled with medications as prescribed, with individualized exercise RX and with personalized nutrition plan. Value goals: LDL < 70mg , HDL > 40 mg.       Core Components/Risk Factors/Patient Goals Review:  Goals and Risk Factor Review    Row Name 12/27/17 1432 02/03/18 1540 03/03/18 1113         Core Components/Risk Factors/Patient Goals Review   Personal Goals Review  Weight Management/Obesity;Heart Failure;Lipids;Hypertension;Diabetes  Weight Management/Obesity;Heart Failure;Lipids;Hypertension;Diabetes  Weight Management/Obesity;Heart Failure;Lipids;Hypertension;Diabetes     Review  Pt with multiple CAD RFs willing to participate in CR exercise. "Duck" would like to improve his mobility and increase his pace.   Pt with multiple CAD RFs willing to participate in CR exercise. Orland Dec is doing well with exercise. He is enjoying coming to CR.   Pt with multiple CAD RFs willing to participate in CR exercise. Orland Dec is doing well with exercise. However he has had recent absences d/t knee pain.      Expected Outcomes  Pt will continue to participate in CR exercise, nutrition, and lifestly modification opportunitites.   Pt will continue to participate in CR exercise, nutrition, and lifestly modification opportunitites.   Pt will continue to participate in CR exercise, nutrition, and lifestly modification opportunitites.         Core Components/Risk Factors/Patient Goals at Discharge (Final Review):  Goals and Risk Factor Review - 03/03/18 1113      Core Components/Risk Factors/Patient Goals Review   Personal Goals Review  Weight Management/Obesity;Heart Failure;Lipids;Hypertension;Diabetes    Review  Pt with multiple CAD RFs willing to participate  in CR exercise. Orland Dec is doing well with exercise. However he has had recent absences d/t knee pain.     Expected Outcomes  Pt will continue to participate in CR exercise, nutrition, and lifestly modification opportunitites.        ITP Comments: ITP Comments    Row Name 12/06/17 1703 12/27/17 1355 02/03/18 1537 03/03/18 1112     ITP Comments  Dr. Fransico Him, Medical Director   30 Day ITP Review. Pt started excercise today and tolerated it well.   30 Day ITP Review. Duck continues to tolerate exercise well.  He enjoys coming to CR. Working with patient and DME provider to find an oxygen source that works well with pt.   30 Day ITP Review. Duck continues to tolerate exercise well.  He enjoys coming to CR. Duck continues to work with DME provider for oxygen that is a better fit.  He has recent absences d/t his knee pain.        Comments: See ITP Comments.

## 2018-03-04 ENCOUNTER — Encounter (HOSPITAL_COMMUNITY): Payer: Medicare Other

## 2018-03-07 ENCOUNTER — Encounter (HOSPITAL_COMMUNITY)
Admission: RE | Admit: 2018-03-07 | Discharge: 2018-03-07 | Disposition: A | Discharge status: Home / Self Care | Payer: Medicare Other | Source: Ambulatory Visit | Attending: Cardiology | Admitting: Cardiology

## 2018-03-07 ENCOUNTER — Encounter (HOSPITAL_COMMUNITY): Payer: Medicare Other

## 2018-03-07 DIAGNOSIS — E119 Type 2 diabetes mellitus without complications: Secondary | ICD-10-CM | POA: Insufficient documentation

## 2018-03-07 DIAGNOSIS — I5022 Chronic systolic (congestive) heart failure: Secondary | ICD-10-CM | POA: Diagnosis not present

## 2018-03-07 LAB — GLUCOSE, CAPILLARY: GLUCOSE-CAPILLARY: 128 mg/dL — AB (ref 70–99)

## 2018-03-09 ENCOUNTER — Encounter (HOSPITAL_COMMUNITY)
Admission: RE | Admit: 2018-03-09 | Discharge: 2018-03-09 | Disposition: A | Discharge status: Home / Self Care | Payer: Medicare Other | Source: Ambulatory Visit | Attending: Cardiology | Admitting: Cardiology

## 2018-03-09 ENCOUNTER — Encounter (HOSPITAL_COMMUNITY): Payer: Medicare Other

## 2018-03-09 DIAGNOSIS — E119 Type 2 diabetes mellitus without complications: Secondary | ICD-10-CM | POA: Diagnosis not present

## 2018-03-09 DIAGNOSIS — I5022 Chronic systolic (congestive) heart failure: Secondary | ICD-10-CM | POA: Diagnosis not present

## 2018-03-09 LAB — GLUCOSE, CAPILLARY: Glucose-Capillary: 99 mg/dL (ref 70–99)

## 2018-03-09 NOTE — Progress Notes (Signed)
[name] [address], [place] [address] Phone: [phone] Subjective:   [name], am serving as a scribe for Dr. Hulan Saas.   CC: Bilateral knee pain  YPP:[id number]  [name] is a 62 y.o. male coming in with complaint of bilateral swelling and pain in both knees. Increased over the past week with NuStep use for rehab.  Known arthritic changes of the knees bilaterally.  Patient has to wear oxygen as well.  Trying to lose weight but finding it difficult.  States that the knee sometimes feels unstable and swollen      Past Medical History:  Diagnosis Date  . CAD (coronary artery disease)    a. Initial nonobst by cath 2009. b. Cath 01/2013 in setting of VT storm: obstructive distal Cx disease (small and terminates in the AV groove, unlikely to cause significant ischemia or electrical instability), nonobstructive RCA disease, EF 15-20%.   . Cerebrovascular accident [facility])    a. 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 Chronic systolic CHF (congestive heart failure) ([place])    a. Likely NICM (out of proportion to CAD). b. 2009 - EF 25-30% by echo, 01/2013: 15-20% by cath. c. 11/2014 Echo: EF 35-40%, Gr1 DD, mild MR, mod TR, PASP 67mmHg.  . CKD (chronic kidney disease), stage II   . Dyslipidemia   . Gout   . HTN (hypertension)   . Hypokalemia   . ICD (implantable cardiac defibrillator) in place   . Insulin dependent diabetes mellitus ([name])   . Lipoma   . Nonischemic cardiomyopathy ([place])    a.  11/2014 Echo: EF 35-40%, Gr1 DD, mild MR, mod TR, PASP 49mmHg.  . OSA (obstructive sleep apnea)    does not wear cpap  . PAF (paroxysmal atrial fibrillation) ([place])    a. Noted 05/2008 by EKG;  b. CHA2DS2VASc = 5-6-->coumadin.  . Paroxysmal VT ([place])    a. s/p St. Jude ICD 2007. b. H/o paroxysmal VT/VF including VT storm 12/2012 admission prompting amio initiation;  c. 01/2013 ICD upgrade SJM [id number] [place] VR single lead ICD.  [name] Pulmonary HTN  ([place])    a. Mild by cath 01/2013.   Past Surgical History:  Procedure Laterality Date  . CARDIAC CATHETERIZATION     Nonobstructive coronary disease 2009  . CARDIAC DEFIBRILLATOR PLACEMENT     ICD-St. Jude  . IMPLANTABLE CARDIOVERTER DEFIBRILLATOR (ICD) GENERATOR CHANGE N/A 01/15/2014   Procedure: ICD GENERATOR CHANGE;  Surgeon: Evans Lance, MD;  Location: [facility] CATH LAB;  Service: Cardiovascular;  Laterality: N/A;  . LEFT AND RIGHT HEART CATHETERIZATION WITH CORONARY ANGIOGRAM N/A 01/03/2013   Procedure: LEFT AND RIGHT HEART CATHETERIZATION WITH CORONARY ANGIOGRAM;  Surgeon: Peter M Martinique, MD;  Location: [facility] CATH LAB;  Service: Cardiovascular;  Laterality: N/A;  . LIPOMA EXCISION    . RIGHT/LEFT HEART CATH AND CORONARY ANGIOGRAPHY N/A 10/04/2017   Procedure: RIGHT/LEFT HEART CATH AND CORONARY ANGIOGRAPHY;  Surgeon: Larey Dresser, MD;  Location: [facility] CV LAB;  Service: Cardiovascular;  Laterality: N/A;   Social History   Socioeconomic History  . Marital status: Married    Spouse name: Not on file  . Number of children: 2  . Years of education: Not on file  . Highest education level: Not on file  Occupational History  . Occupation: medically retired due to heart failure  Social Needs  . Financial resource strain: Not on file  . Food insecurity:    Worry: Not on  file    Inability: Not on file  . Transportation needs:    Medical: Yes    Non-medical: No  Tobacco Use  . Smoking status: Former Smoker    Last attempt to quit: 05/04/1986    Years since quitting: 31.8  . Smokeless tobacco: Never Used  . Tobacco comment: Quit smoking 30 yrs ago. Smoked as teenager less than 1/2 ppd. Smoked x 4 years.  Substance and Sexual Activity  . Alcohol use: No    Comment: occasionally  . Drug use: No  . Sexual activity: Never  Lifestyle  . Physical activity:    Days per week: 0 days    Minutes per session: 0 min  . Stress: Not at all  Relationships  . Social connections:    Talks on  phone: Not on file    Gets together: Not on file    Attends religious service: Not on file    Active member of club or organization: Not on file    Attends meetings of clubs or organizations: Not on file    Relationship status: Not on file  Other Topics Concern  . Not on file  Social History Narrative  . Not on file   No Known Allergies Family History  Problem Relation Age of Onset  . Coronary artery disease Mother   . Heart attack Mother   . Heart attack Maternal Uncle   . Heart attack Maternal Grandmother     Current Outpatient Medications (Endocrine & Metabolic):  [name]  Insulin Glargine (LANTUS SOLOSTAR) 100 UNIT/ML Solostar Pen, INJECT 10 UNITS INTO THE SKIN AT BEDTIME. [name]  LANTUS SOLOSTAR 100 UNIT/ML Solostar Pen, INJECT 10 UNITS INTO THE SKIN AT BEDTIME.  Current Outpatient Medications (Cardiovascular):  .  amiodarone (PACERONE) 200 MG tablet, Take 1 tablet (200 mg total) by mouth daily. [name]  atorvastatin (LIPITOR) 80 MG tablet, Take 1 tablet (80 mg total) by mouth at bedtime. .  carvedilol (COREG) 25 MG tablet, Take 0.5 tablets (12.5 mg total) by mouth 2 (two) times daily with a meal. .  digoxin (LANOXIN) 0.125 MG tablet, Take 1 tablet (0.125 mg total) by mouth every other day. .  isosorbide-hydrALAZINE (BIDIL) 20-37.5 MG tablet, Take 1 tablet by mouth 3 (three) times daily. .  nitroGLYCERIN (NITROSTAT) 0.4 MG SL tablet, PLACE 1 TABLET UNDER TONGUE EVERY 5 MINS X3 DOSES AS NEEDED FOR CHEST PAIN .  spironolactone (ALDACTONE) 25 MG tablet, Take 1 tablet (25 mg total) by mouth daily. [name]  torsemide (DEMADEX) 20 MG tablet, Take 3 tablets (60 mg total) by mouth daily. Take extra 20 mg tablet once daily AS NEEDED for weight gain 3 lbs or more in 24 hrs.   Current Outpatient Medications (Analgesics):  .  acetaminophen (TYLENOL) 500 MG tablet, Take 500-1,000 mg by mouth every 6 (six) hours as needed (for bilateral knee pain). [name]  allopurinol (ZYLOPRIM) 300 MG tablet, Take 1 tablet (300 mg  total) by mouth daily. [name]  aspirin EC 81 MG EC tablet, Take 1 tablet (81 mg total) by mouth daily. .  traMADol (ULTRAM) 50 MG tablet, Take 1 tablet (50 mg total) by mouth every 8 (eight) hours as needed.  Current Outpatient Medications (Hematological):  .  warfarin (COUMADIN) 5 MG tablet, Take 5-10 mg by mouth See admin instructions. Take 5mg  every day except take 10mg  Mondays and Fridays and take 7.5mg  Wednesdays  Current Outpatient Medications (Other):  .  cholecalciferol (VITAMIN D) 1000 units tablet, Take 1,000 Units by mouth daily. [name]  Insulin Pen Needle (B-D ULTRAFINE III SHORT PEN) 31G X 8 MM MISC, USE ONCE DAILY WITH INSULIN .  Insulin Pen Needle (PEN NEEDLES) 31G X 5 MM MISC, 100 each by Does not apply route 2 (two) times daily. .  potassium chloride SA (K-DUR,KLOR-CON) 20 MEQ tablet, Take 1 tablet (20 mEq total) by mouth daily. .  Turmeric 500 MG CAPS, Take 1,000 mg by mouth daily as needed (inflammation).     Past medical history, social, surgical and family history all reviewed in electronic medical record.  No pertanent information unless stated regarding to the chief complaint.   Review of Systems:  No headache, visual changes, nausea, vomiting, diarrhea, constipation, dizziness, abdominal pain, skin rash, fevers, chills, night sweats, weight loss, swollen lymph nodes, body aches, joint swelling, muscle aches, chest pain, shortness of breath, mood changes.   Objective  There were no vitals taken for this visit. Systems examined below as of    General: No apparent distress alert and oriented x3 mood and affect normal, dressed appropriately.  Obese HEENT: Pupils equal, extraocular movements intact  Respiratory: Patient's speak in full sentences and does not appear short of breath patient was wearing a nasal cannula Cardiovascular: 2+ lower extremity edema, non tender, no erythema hemosiderin deposits Skin: Warm dry intact with no signs of infection or rash on extremities or on  axial skeleton.  Abdomen: Soft nontender  Neuro: Cranial nerves II through XII are intact, neurovascularly intact in all extremities with 2+ DTRs and 2+ pulses.  Lymph: No lymphadenopathy of posterior or anterior cervical chain or axillae bilaterally.  Gait severely antalgic MSK: Mild tender with mild limited range of motion and good stability and symmetric strength and tone of shoulders, elbows, wrist, hip, and ankles bilaterally.  Knee: Lateral valgus deformity noted. Large thigh to calf ratio.  Tender to palpation over medial and PF joint line.  ROM full in flexion and extension and lower leg rotation. instability with valgus force.  painful patellar compression. Patellar glide with moderate crepitus. Patellar and quadriceps tendons unremarkable. Hamstring and quadriceps strength is normal.  After informed written and verbal consent, patient was seated on exam table. Right knee was prepped with alcohol swab and utilizing anterolateral approach, patient's right knee space was injected with 4:1  marcaine 0.5%: Kenalog 40mg /dL. Patient tolerated the procedure well without immediate complications.  After informed written and verbal consent, patient was seated on exam table. Left knee was prepped with alcohol swab and utilizing anterolateral approach, patient's left knee space was injected with 4:1  marcaine 0.5%: Kenalog 40mg /dL. Patient tolerated the procedure well without immediate complications.   Impression and Recommendations:     The above documentation has been reviewed and is accurate and complete Lyndal Pulley, DO       Note: This dictation was prepared with Dragon dictation along with smaller phrase technology. Any transcriptional errors that result from this process are unintentional.

## 2018-03-11 ENCOUNTER — Encounter (HOSPITAL_COMMUNITY): Payer: Medicare Other

## 2018-03-11 ENCOUNTER — Ambulatory Visit (INDEPENDENT_AMBULATORY_CARE_PROVIDER_SITE_OTHER): Payer: Medicare Other | Admitting: Family Medicine

## 2018-03-11 DIAGNOSIS — M170 Bilateral primary osteoarthritis of knee: Secondary | ICD-10-CM

## 2018-03-11 NOTE — Patient Instructions (Signed)
Good to see you  Ice is your friend Injected the knee again  Can do another injection if need or otherwise can do steroid injection every 3 months  Set up an appointment in 4-6 weeks or See me when you need me

## 2018-03-12 ENCOUNTER — Encounter: Payer: Self-pay | Admitting: Family Medicine

## 2018-03-12 DIAGNOSIS — M170 Bilateral primary osteoarthritis of knee: Secondary | ICD-10-CM | POA: Insufficient documentation

## 2018-03-12 NOTE — Assessment & Plan Note (Signed)
Patient has been more active recently.  Discussed icing regimen and home exercises.  Patient does have cardiovascular disease as well as diabetes and will monitor blood sugars.  Patient is very good though with a sliding insulin.  Discussed with the importance of weight loss, topical anti-inflammatories given, discussed icing regimen.  Follow-up in 4 weeks and could be a candidate for Visco supplementation

## 2018-03-14 ENCOUNTER — Encounter (HOSPITAL_COMMUNITY): Payer: Medicare Other

## 2018-03-14 ENCOUNTER — Encounter (HOSPITAL_COMMUNITY)
Admission: RE | Admit: 2018-03-14 | Discharge: 2018-03-14 | Disposition: A | Discharge status: Home / Self Care | Payer: Medicare Other | Source: Ambulatory Visit | Attending: Cardiology | Admitting: Cardiology

## 2018-03-14 VITALS — Ht 70.25 in | Wt 272.5 lb

## 2018-03-14 DIAGNOSIS — I5022 Chronic systolic (congestive) heart failure: Secondary | ICD-10-CM

## 2018-03-14 DIAGNOSIS — E119 Type 2 diabetes mellitus without complications: Secondary | ICD-10-CM | POA: Diagnosis not present

## 2018-03-14 LAB — GLUCOSE, CAPILLARY: Glucose-Capillary: 124 mg/dL — ABNORMAL HIGH (ref 70–99)

## 2018-03-16 ENCOUNTER — Encounter (HOSPITAL_COMMUNITY): Payer: Medicare Other

## 2018-03-16 ENCOUNTER — Encounter (HOSPITAL_COMMUNITY)
Admission: RE | Admit: 2018-03-16 | Discharge: 2018-03-16 | Disposition: A | Discharge status: Home / Self Care | Payer: Medicare Other | Source: Ambulatory Visit | Attending: Cardiology | Admitting: Cardiology

## 2018-03-16 ENCOUNTER — Encounter (HOSPITAL_COMMUNITY): Payer: Self-pay

## 2018-03-16 DIAGNOSIS — I5022 Chronic systolic (congestive) heart failure: Secondary | ICD-10-CM

## 2018-03-16 DIAGNOSIS — E119 Type 2 diabetes mellitus without complications: Secondary | ICD-10-CM | POA: Diagnosis not present

## 2018-03-16 LAB — GLUCOSE, CAPILLARY: Glucose-Capillary: 119 mg/dL — ABNORMAL HIGH (ref 70–99)

## 2018-03-18 ENCOUNTER — Encounter (HOSPITAL_COMMUNITY): Payer: Medicare Other

## 2018-03-25 NOTE — Progress Notes (Signed)
Discharge Progress Report  Patient Details  Name: [name] MRN: [id number] Date of Birth: 04-May-1956 Referring Provider:     CARDIAC REHAB PHASE II ORIENTATION from 12/07/2017 in [place]  Referring Provider  Minus Breeding, MD       Number of Visits: 19  Reason for Discharge:  Patient reached a stable level of exercise.  Smoking History:  Social History   Tobacco Use  Smoking Status Former Smoker  . Last attempt to quit: 05/04/1986  . Years since quitting: 31.9  Smokeless Tobacco Never Used  Tobacco Comment   Quit smoking 30 yrs ago. Smoked as teenager less than 1/2 ppd. Smoked x 4 years.    Diagnosis:  8676 Chronic systolic congestive heart failure ([place])  ADL [facility]:   Initial Exercise Prescription: Initial Exercise Prescription - 12/07/17 1100      Date of Initial Exercise RX and Referring Provider   Date  12/07/17    Referring Provider  Minus Breeding, MD      Oxygen   Oxygen  Continuous    Liters  2      Recumbant Bike   Level  2    Watts  15    Minutes  10    METs  2.39      NuStep   Level  2    SPM  85    Minutes  10    METs  2      Track   Laps  7    Minutes  10    METs  2.23      Prescription Details   Frequency (times per week)  2    Duration  Progress to 30 minutes of continuous aerobic without signs/symptoms of physical distress      Intensity   THRR 40-80% of Max Heartrate  64-127    Ratings of Perceived Exertion  11-13    Perceived Dyspnea  0-4      Progression   Progression  Continue to progress workloads to maintain intensity without signs/symptoms of physical distress.      Resistance Training   Training Prescription  Yes    Weight  3lbs    Reps  10-15       Discharge Exercise Prescription (Final Exercise Prescription Changes): Exercise Prescription Changes - 03/16/18 1008      Response to Exercise   Blood Pressure (Admit)  120/70    Blood Pressure (Exercise)  118/64    Blood  Pressure (Exit)  110/74    Heart Rate (Admit)  99 bpm    Heart Rate (Exercise)  122 bpm    Heart Rate (Exit)  98 bpm    Oxygen Saturation (Admit)  96 %    Oxygen Saturation (Exercise)  97 %    Rating of Perceived Exertion (Exercise)  12    Perceived Dyspnea (Exercise)  0    Symptoms  None    Comments  Pt graduated from Cardiac Rehab     Duration  Continue with 30 min of aerobic exercise without signs/symptoms of physical distress.    Intensity  THRR unchanged      Progression   Progression  Continue to progress workloads to maintain intensity without signs/symptoms of physical distress.    Average METs  2      Resistance Training   Training Prescription  No      Interval Training   Interval Training  No      Oxygen  Oxygen  Continuous    Liters  3      NuStep   Level  2    SPM  95    Minutes  15    METs  1.9      Arm Ergometer   Level  3    Watts  25    Minutes  15    METs  2.1      Home Exercise Plan   Plans to continue exercise at  Home (comment)   Walking   Frequency  Add 2 additional days to program exercise sessions.    Initial Home Exercises Provided  01/24/18       Functional Capacity: 6 Minute Walk    Row Name 12/07/17 0845 03/16/18 0820       6 Minute Walk   Phase  Initial  Discharge    Distance  757 feet  800 feet    Distance % Change  -  5.68 %    Distance Feet Change  -  43 ft    Walk Time  6 minutes  4.25 minutes    # of Rest Breaks  0  0    MPH  1.43  1.52    METS  1.59  1.95    RPE  13  14    Perceived Dyspnea   -  2    VO2 Peak  5.57  6.81    Symptoms  Yes (comment)  Yes (comment)    Comments  Patient c/o bilateral knee pain.  Patient c/o of bilateral knee pain, +2 SOB     Resting HR  67 bpm  82 bpm    Resting BP  104/72  112/70    Resting Oxygen Saturation   94 % 2L/min O2  96 %    Exercise Oxygen Saturation  during 6 min walk  92 % 2L/min O2  92 %    Max Ex. HR  100 bpm  107 bpm    Max Ex. BP  106/62  134/84    2 Minute Post  BP  102/60  104/70       Psychological, QOL, Others - Outcomes: PHQ 2/9: Depression screen [facility] 2/9 03/16/2018 12/27/2017 10/28/2017 10/25/2017 08/04/2017  Decreased Interest 0 0 0 0 0  Down, Depressed, Hopeless 0 0 0 0 0  PHQ - 2 Score 0 0 0 0 0  Altered sleeping - - - - -  Tired, decreased energy - - - - -  Change in appetite - - - - -  Feeling bad or failure about yourself  - - - - -  Trouble concentrating - - - - -  Moving slowly or fidgety/restless - - - - -  Suicidal thoughts - - - - -  PHQ-9 Score - - - - -  Some recent data might be hidden    Quality of Life: Quality of Life - 03/18/18 1006      Quality of Life   Select  Quality of Life      Quality of Life Scores   Health/Function Pre  22.5 %    Health/Function Post  23.67 %    Health/Function % Change  5.2 %    Socioeconomic Pre  26.75 %    Socioeconomic Post  24.56 %    Socioeconomic % Change   -8.19 %    Psych/Spiritual Pre  30 %    Psych/Spiritual Post  28.29 %    Psych/Spiritual % Change  -5.7 %  Family Pre  30 %    Family Post  28.5 %    Family % Change  -5 %    GLOBAL Pre  26 %    GLOBAL Post  25.49 %    GLOBAL % Change  -1.96 %       Personal Goals: Goals established at orientation with interventions provided to work toward goal. Personal Goals and Risk Factors at Admission - 12/07/17 1124      Core Components/Risk Factors/Patient Goals on Admission    Weight Management  Yes;Obesity    Intervention  Weight Management/Obesity: Establish reasonable short term and long term weight goals.;Obesity: Provide education and appropriate resources to help participant work on and attain dietary goals.    Admit Weight  272 lb 11.3 oz (123.7 kg)    Expected Outcomes  Short Term: Continue to assess and modify interventions until short term weight is achieved;Long Term: Adherence to nutrition and physical activity/exercise program aimed toward attainment of established weight goal;Weight Loss: Understanding of  general recommendations for a balanced deficit meal plan, which promotes 1-2 lb weight loss per week and includes a negative energy balance of [phone] kcal/d    Diabetes  Yes    Intervention  Provide education about signs/symptoms and action to take for hypo/hyperglycemia.;Provide education about proper nutrition, including hydration, and aerobic/resistive exercise prescription along with prescribed medications to achieve blood glucose in normal ranges: Fasting glucose 65-99 mg/dL    Expected Outcomes  Short Term: Participant verbalizes understanding of the signs/symptoms and immediate care of hyper/hypoglycemia, proper foot care and importance of medication, aerobic/resistive exercise and nutrition plan for blood glucose control.;Long Term: Attainment of HbA1C < 7%.    Heart Failure  Yes    Intervention  Provide a combined exercise and nutrition program that is supplemented with education, support and counseling about heart failure. Directed toward relieving symptoms such as shortness of breath, decreased exercise tolerance, and extremity edema.    Expected Outcomes  Improve functional capacity of life;Short term: Attendance in program 2-3 days a week with increased exercise capacity. Reported lower sodium intake. Reported increased fruit and vegetable intake. Reports medication compliance.;Short term: Daily weights obtained and reported for increase. Utilizing diuretic protocols set by physician.;Long term: Adoption of self-care skills and reduction of barriers for early signs and symptoms recognition and intervention leading to self-care maintenance.    Hypertension  Yes    Intervention  Provide education on lifestyle modifcations including regular physical activity/exercise, weight management, moderate sodium restriction and increased consumption of fresh fruit, vegetables, and low fat dairy, alcohol moderation, and smoking cessation.;Monitor prescription use compliance.    Expected Outcomes  Short  Term: Continued assessment and intervention until BP is < 140/39mm HG in hypertensive participants. < 130/58mm HG in hypertensive participants with diabetes, heart failure or chronic kidney disease.;Long Term: Maintenance of blood pressure at goal levels.    Lipids  Yes    Intervention  Provide education and support for participant on nutrition & aerobic/resistive exercise along with prescribed medications to achieve LDL 70mg , HDL >40mg .    Expected Outcomes  Short Term: Participant states understanding of desired cholesterol values and is compliant with medications prescribed. Participant is following exercise prescription and nutrition guidelines.;Long Term: Cholesterol controlled with medications as prescribed, with individualized exercise RX and with personalized nutrition plan. Value goals: LDL < 70mg , HDL > 40 mg.        Personal Goals Discharge: Goals and Risk Factor Review    Row Name 12/27/17 1432 02/03/18  1540 03/03/18 1113 03/17/18 1620       Core Components/Risk Factors/Patient Goals Review   Personal Goals Review  Weight Management/Obesity;Heart Failure;Lipids;Hypertension;Diabetes  Weight Management/Obesity;Heart Failure;Lipids;Hypertension;Diabetes  Weight Management/Obesity;Heart Failure;Lipids;Hypertension;Diabetes  Weight Management/Obesity;Heart Failure;Lipids;Hypertension;Diabetes    Review  Pt with multiple CAD RFs willing to participate in CR exercise. "Duck" would like to improve his mobility and increase his pace.   Pt with multiple CAD RFs willing to participate in CR exercise. [place] Dec is doing well with exercise. He is enjoying coming to CR.   Pt with multiple CAD RFs willing to participate in CR exercise. [place] Dec is doing well with exercise. However he has had recent absences d/t knee pain.   Duck has graduated from [employer] with 19 completed sessions. Duck feels better than before starting and has increased his confidence.     Expected Outcomes  Pt will continue to participate in CR  exercise, nutrition, and lifestly modification opportunitites.   Pt will continue to participate in CR exercise, nutrition, and lifestly modification opportunitites.   Pt will continue to participate in CR exercise, nutrition, and lifestly modification opportunitites.   Pt will continue to participate in exercise, nutrition, and lifestly modification opportunitites. He plans to walk indoors during the colder weather.        Exercise Goals and Review: Exercise Goals    Row Name 12/07/17 0915             Exercise Goals   Increase Physical Activity  Yes       Intervention  Provide advice, education, support and counseling about physical activity/exercise needs.;Develop an individualized exercise prescription for aerobic and resistive training based on initial evaluation findings, risk stratification, comorbidities and participant's personal goals.       Expected Outcomes  Short Term: Attend rehab on a regular basis to increase amount of physical activity.;Long Term: Exercising regularly at least 3-5 days a week.;Long Term: Add in home exercise to make exercise part of routine and to increase amount of physical activity.       Increase Strength and Stamina  Yes       Intervention  Provide advice, education, support and counseling about physical activity/exercise needs.;Develop an individualized exercise prescription for aerobic and resistive training based on initial evaluation findings, risk stratification, comorbidities and participant's personal goals.       Expected Outcomes  Short Term: Increase workloads from initial exercise prescription for resistance, speed, and METs.;Short Term: Perform resistance training exercises routinely during rehab and add in resistance training at home;Long Term: Improve cardiorespiratory fitness, muscular endurance and strength as measured by increased METs and functional capacity (6MWT)       Able to understand and use rate of perceived exertion (RPE) scale  Yes        Intervention  Provide education and explanation on how to use RPE scale       Expected Outcomes  Long Term:  Able to use RPE to guide intensity level when exercising independently;Short Term: Able to use RPE daily in rehab to express subjective intensity level       Able to understand and use Dyspnea scale  Yes       Intervention  Provide education and explanation on how to use Dyspnea scale       Expected Outcomes  Short Term: Able to use Dyspnea scale daily in rehab to express subjective sense of shortness of breath during exertion;Long Term: Able to use Dyspnea scale to guide intensity level when exercising independently  Knowledge and understanding of Target Heart Rate Range (THRR)  Yes       Intervention  Provide education and explanation of THRR including how the numbers were predicted and where they are located for reference       Expected Outcomes  Short Term: Able to state/look up THRR;Long Term: Able to use THRR to govern intensity when exercising independently;Short Term: Able to use daily as guideline for intensity in rehab       Able to check pulse independently  Yes       Intervention  Provide education and demonstration on how to check pulse in carotid and radial arteries.;Review the importance of being able to check your own pulse for safety during independent exercise       Expected Outcomes  Short Term: Able to explain why pulse checking is important during independent exercise;Long Term: Able to check pulse independently and accurately       Understanding of Exercise Prescription  Yes       Intervention  Provide education, explanation, and written materials on patient's individual exercise prescription       Expected Outcomes  Short Term: Able to explain program exercise prescription;Long Term: Able to explain home exercise prescription to exercise independently          Exercise Goals Re-Evaluation: Exercise Goals Re-Evaluation    Row Name 01/24/18 1553 03/03/18 1449  03/18/18 1012 03/18/18 1013       Exercise Goal Re-Evaluation   Exercise Goals Review  Increase Physical Activity;Understanding of Exercise Prescription  Understanding of Exercise Prescription;Increase Physical Activity  Increase Physical Activity;Understanding of Exercise Prescription  Increase Physical Activity;Understanding of Exercise Prescription    Comments  Reviewed HEP with pt. Also reviewed THRR, RPE Scale, weather precautions, endpoints of exercise, NTG use, warmup and cool down.   Reviewed METs and goals with pt. Pt is able to exercise 30 minutes with minimal problems. Pt has been out of rehab for a week due to chronic knee pain. Will follow up with pt to see if he is getting better.   Pt completed 19 sessions of Cardiac Rehab.   Pt completed 19 sessions of Cardiac Rehab. Pt increased his functional capacity by 5.68% and increased post 6MWT distance by 5ft. Pt increased single leg balance test from 1.47 secs to 4.03 secs. Pt is currently on wait list for new oxygen machine, pt states he is more likely to do more exercise once he recieves that.     Expected Outcomes  Pt will plan to walk 2-3 days a week for 15-30 minutes. Pt will continue to increase cardiorespiratory fitness. Will continue to monitor and progress pt as tolerated.   Pt has not been walking at home for exercise. Pt states he is limited by oxygen tank and knee pain. Pt is on waiting list for new oxygen tank that will allow him to be more mobile.   -  Pt will plan to walk in the house 3 days a week for 10 minutes, 3x a day. Emphasized the importance to continue to exercise. Pt states he is able to walk in the house with this oxgyen tank. Pt states he will continue to exericse and is motivated to keep moving.        Nutrition & Weight - Outcomes: Pre Biometrics - 12/07/17 0843      Pre Biometrics   Height  5' 10.25" (1.784 m)    Weight  123.7 kg    Waist Circumference  45 inches  Hip Circumference  50.75 inches    Waist to  Hip Ratio  0.89 %    BMI (Calculated)  38.87    Triceps Skinfold  33 mm    % Body Fat  36.8 %    Grip Strength  32.5 kg    Flexibility  10.5 in    Single Leg Stand  1.47 seconds      Post Biometrics - 03/16/18 0823       Post  Biometrics   Height  5' 10.25" (1.784 m)    Weight  123.6 kg    Waist Circumference  47 inches    Hip Circumference  51 inches    Waist to Hip Ratio  0.92 %    BMI (Calculated)  38.84    Triceps Skinfold  33 mm    % Body Fat  37.7 %    Grip Strength  30 kg    Flexibility  12.5 in    Single Leg Stand  4.03 seconds       Nutrition: Nutrition Therapy & Goals - 12/07/17 1218      Nutrition Therapy   Diet  consistent carbohydrate heart healthy      Personal Nutrition Goals   Nutrition Goal  Pt to identify and limit food sources of saturated fat, trans fat, and sodium    Personal Goal #2  Pt to identify food quantities necessary to achieve weight loss of 6-24 lbs. at graduation from cardiac rehab.       Intervention Plan   Intervention  Prescribe, educate and counsel regarding individualized specific dietary modifications aiming towards targeted core components such as weight, hypertension, lipid management, diabetes, heart failure and other comorbidities.    Expected Outcomes  Short Term Goal: Understand basic principles of dietary content, such as calories, fat, sodium, cholesterol and nutrients.       Nutrition Discharge: Nutrition Assessments - 03/24/18 1613      MEDFICTS Scores   Pre Score  27    Post Score  15    Score Difference  -12       Education Questionnaire Score: Knowledge Questionnaire Score - 03/18/18 1004      Knowledge Questionnaire Score   Pre Score  17/24    Post Score  21/24       Goals reviewed with patient; copy given to patient.

## 2018-04-02 DIAGNOSIS — I5043 Acute on chronic combined systolic (congestive) and diastolic (congestive) heart failure: Secondary | ICD-10-CM | POA: Diagnosis not present

## 2018-04-11 NOTE — Progress Notes (Addendum)
[name] [address] [place], [place] [address] Phone: [phone] Subjective:   [name], am serving as a scribe for Dr. Hulan Saas.   CC: Bilateral knee pain  FIE:[id number]  [name] is a 62 y.o. male coming in with complaint of bilateral knee pain. He states that he did have some relief from injections given last visit. Right knee pain is worse that left. Is feeling a tightness in the knees.  Patient does have knee arthritis bilaterally.  Has been doing relatively well.  Having some increasing discomfort though.  Patient states that symptoms stops him from activities.  Patient states even a dull throbbing aching pain at night.  Has mild instability from time to time.     Past Medical History:  Diagnosis Date  . CAD (coronary artery disease)    a. Initial nonobst by cath 2009. b. Cath 01/2013 in setting of VT storm: obstructive distal Cx disease (small and terminates in the AV groove, unlikely to cause significant ischemia or electrical instability), nonobstructive RCA disease, EF 15-20%.   . Cerebrovascular accident [facility])    a. 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 Chronic systolic CHF (congestive heart failure) ([place])    a. Likely NICM (out of proportion to CAD). b. 2009 - EF 25-30% by echo, 01/2013: 15-20% by cath. c. 11/2014 Echo: EF 35-40%, Gr1 DD, mild MR, mod TR, PASP 11mmHg.  . CKD (chronic kidney disease), stage II   . Dyslipidemia   . Gout   . HTN (hypertension)   . Hypokalemia   . ICD (implantable cardiac defibrillator) in place   . Insulin dependent diabetes mellitus ([place])   . Lipoma   . Nonischemic cardiomyopathy ([place])    a.  11/2014 Echo: EF 35-40%, Gr1 DD, mild MR, mod TR, PASP 85mmHg.  . OSA (obstructive sleep apnea)    does not wear cpap  . PAF (paroxysmal atrial fibrillation) ([place])    a. Noted 05/2008 by EKG;  b. CHA2DS2VASc = 5-6-->coumadin.  . Paroxysmal VT ([place])    a. s/p St. Jude ICD 2007. b. H/o paroxysmal  VT/VF including VT storm 12/2012 admission prompting amio initiation;  c. 01/2013 ICD upgrade SJM 1411-36Q Ellipse VR single lead ICD.  [name] Pulmonary HTN ([place])    a. Mild by cath 01/2013.   Past Surgical History:  Procedure Laterality Date  . CARDIAC CATHETERIZATION     Nonobstructive coronary disease 2009  . CARDIAC DEFIBRILLATOR PLACEMENT     ICD-St. Jude  . IMPLANTABLE CARDIOVERTER DEFIBRILLATOR (ICD) GENERATOR CHANGE N/A 01/15/2014   Procedure: ICD GENERATOR CHANGE;  Surgeon: Evans Lance, MD;  Location: [facility] CATH LAB;  Service: Cardiovascular;  Laterality: N/A;  . LEFT AND RIGHT HEART CATHETERIZATION WITH CORONARY ANGIOGRAM N/A 01/03/2013   Procedure: LEFT AND RIGHT HEART CATHETERIZATION WITH CORONARY ANGIOGRAM;  Surgeon: Peter M Martinique, MD;  Location: [facility] CATH LAB;  Service: Cardiovascular;  Laterality: N/A;  . LIPOMA EXCISION    . RIGHT/LEFT HEART CATH AND CORONARY ANGIOGRAPHY N/A 10/04/2017   Procedure: RIGHT/LEFT HEART CATH AND CORONARY ANGIOGRAPHY;  Surgeon: Larey Dresser, MD;  Location: [facility] CV LAB;  Service: Cardiovascular;  Laterality: N/A;   Social History   Socioeconomic History  . Marital status: Married    Spouse name: Not on file  . Number of children: 2  . Years of education: Not on file  . Highest education level: Not on file  Occupational History  . Occupation: medically retired due  to heart failure  Social Needs  . Financial resource strain: Not on file  . Food insecurity:    Worry: Not on file    Inability: Not on file  . Transportation needs:    Medical: Yes    Non-medical: [name]  Tobacco Use  . Smoking status: Former Smoker    Last attempt to quit: 05/04/1986    Years since quitting: 31.9  . Smokeless tobacco: Never Used  . Tobacco comment: Quit smoking 30 yrs ago. Smoked as teenager less than 1/2 ppd. Smoked x 4 years.  Substance and Sexual Activity  . Alcohol use: [name]    Comment: occasionally  . Drug use: [name]  . Sexual activity: Never  Lifestyle  .  Physical activity:    Days per week: 0 days    Minutes per session: 0 min  . Stress: Not at all  Relationships  . Social connections:    Talks on phone: Not on file    Gets together: Not on file    Attends religious service: Not on file    Active member of club or organization: Not on file    Attends meetings of clubs or organizations: Not on file    Relationship status: Not on file  Other Topics Concern  . Not on file  Social History Narrative  . Not on file   [name] Known Allergies Family History  Problem Relation Age of Onset  . Coronary artery disease Mother   . Heart attack Mother   . Heart attack Maternal Uncle   . Heart attack Maternal Grandmother     Current Outpatient Medications (Endocrine & Metabolic):  [name]  Insulin Glargine (LANTUS SOLOSTAR) 100 UNIT/ML Solostar Pen, INJECT 10 UNITS INTO THE SKIN AT BEDTIME. [name]  LANTUS SOLOSTAR 100 UNIT/ML Solostar Pen, INJECT 10 UNITS INTO THE SKIN AT BEDTIME.  Current Outpatient Medications (Cardiovascular):  .  amiodarone (PACERONE) 200 MG tablet, Take 1 tablet (200 mg total) by mouth daily. [name]  atorvastatin (LIPITOR) 80 MG tablet, Take 1 tablet (80 mg total) by mouth at bedtime. .  carvedilol (COREG) 25 MG tablet, Take 0.5 tablets (12.5 mg total) by mouth 2 (two) times daily with a meal. .  digoxin (LANOXIN) 0.125 MG tablet, Take 1 tablet (0.125 mg total) by mouth every other day. .  isosorbide-hydrALAZINE (BIDIL) 20-37.5 MG tablet, Take 1 tablet by mouth 3 (three) times daily. .  nitroGLYCERIN (NITROSTAT) 0.4 MG SL tablet, PLACE 1 TABLET UNDER TONGUE EVERY 5 MINS X3 DOSES AS NEEDED FOR CHEST PAIN .  spironolactone (ALDACTONE) 25 MG tablet, Take 1 tablet (25 mg total) by mouth daily. [name]  torsemide (DEMADEX) 20 MG tablet, Take 3 tablets (60 mg total) by mouth daily. Take extra 20 mg tablet once daily AS NEEDED for weight gain 3 lbs or more in 24 hrs.   Current Outpatient Medications (Analgesics):  .  acetaminophen (TYLENOL) 500 MG  tablet, Take 500-1,000 mg by mouth every 6 (six) hours as needed (for bilateral knee pain). [name]  allopurinol (ZYLOPRIM) 300 MG tablet, Take 1 tablet (300 mg total) by mouth daily. [name]  aspirin EC 81 MG EC tablet, Take 1 tablet (81 mg total) by mouth daily. .  traMADol (ULTRAM) 50 MG tablet, Take 1 tablet (50 mg total) by mouth every 8 (eight) hours as needed.  Current Outpatient Medications (Hematological):  .  warfarin (COUMADIN) 5 MG tablet, Take 5-10 mg by mouth See admin instructions. Take 5mg  every day except take 10mg  Mondays and Fridays  and take 7.5mg  Wednesdays  Current Outpatient Medications (Other):  .  cholecalciferol (VITAMIN D) 1000 units tablet, Take 1,000 Units by mouth daily. .  Insulin Pen Needle (B-D ULTRAFINE III SHORT PEN) 31G X 8 MM MISC, USE ONCE DAILY WITH INSULIN .  Insulin Pen Needle (PEN NEEDLES) 31G X 5 MM MISC, 100 each by Does not apply route 2 (two) times daily. .  potassium chloride SA (K-DUR,KLOR-CON) 20 MEQ tablet, Take 1 tablet (20 mEq total) by mouth daily. .  Turmeric 500 MG CAPS, Take 1,000 mg by mouth daily as needed (inflammation).     Past medical history, social, surgical and family history all reviewed in electronic medical record.  [name] pertanent information unless stated regarding to the chief complaint.   Review of Systems:  [name] headache, visual changes, nausea, vomiting, diarrhea, constipation, dizziness, abdominal pain, skin rash, fevers, chills, night sweats, wchest pain, shortness of breath, mood changes.  Positive muscle aches  Objective  Blood pressure 92/80, pulse 85, height 5\' 10"  (1.778 m), weight 262 lb (118.8 kg), SpO2 99 %. Systems examined below as of    General: [name] apparent distress alert and oriented x3 mood and affect normal, dressed appropriately.  HEENT: Pupils equal, extraocular movements intact  Respiratory: Patient's speak in full sentences and does not appear short of breath  Cardiovascular: 1+ lower extremity edema, non tender,  [name] erythema  Skin: Warm dry intact with [name] signs of infection or rash on extremities or on axial skeleton.  Abdomen: Soft nontender  Neuro: Cranial nerves II through XII are intact, neurovascularly intact in all extremities with 2+ DTRs and 2+ pulses.  Lymph: [name] lymphadenopathy of posterior or anterior cervical chain or axillae bilaterally.  Gait normal with good balance and coordination.  MSK:  tender with full range of motion and good stability and symmetric strength and tone of shoulders, elbows, wrist, hip, and ankles bilaterally.   Knee: Bilateral valgus deformity noted. Large thigh to calf ratio.  Tender to palpation over medial and PF joint line.  ROM full in flexion and extension and lower leg rotation. instability with valgus force.  painful patellar compression. Patellar glide with moderate crepitus. Patellar and quadriceps tendons unremarkable. Hamstring and quadriceps strength is normal.  After informed written and verbal consent, patient was seated on exam table. Right knee was prepped with alcohol swab and utilizing anterolateral approach, patient's right knee space was injected with monovisc 22mg /mL. Patient tolerated the procedure well without immediate complications.  After informed written and verbal consent, patient was seated on exam table. Left knee was prepped with alcohol swab and utilizing anterolateral approach, patient's left knee space was injected with monovsic 22mg /mL. Patient tolerated the procedure well without immediate complications.   Impression and Recommendations:     This case required medical decision making of moderate complexity. The above documentation has been reviewed and is accurate and complete Lyndal Pulley, DO       Note: This dictation was prepared with Dragon dictation along with smaller phrase technology. Any transcriptional errors that result from this process are unintentional.

## 2018-04-12 ENCOUNTER — Ambulatory Visit (INDEPENDENT_AMBULATORY_CARE_PROVIDER_SITE_OTHER): Payer: Medicare Other | Admitting: General Practice

## 2018-04-12 ENCOUNTER — Ambulatory Visit (INDEPENDENT_AMBULATORY_CARE_PROVIDER_SITE_OTHER): Payer: Medicare Other | Admitting: Family Medicine

## 2018-04-12 ENCOUNTER — Encounter: Payer: Self-pay | Admitting: Family Medicine

## 2018-04-12 DIAGNOSIS — Z7901 Long term (current) use of anticoagulants: Secondary | ICD-10-CM

## 2018-04-12 DIAGNOSIS — M170 Bilateral primary osteoarthritis of knee: Secondary | ICD-10-CM

## 2018-04-12 DIAGNOSIS — Z8679 Personal history of other diseases of the circulatory system: Secondary | ICD-10-CM

## 2018-04-12 DIAGNOSIS — I4891 Unspecified atrial fibrillation: Secondary | ICD-10-CM | POA: Diagnosis not present

## 2018-04-12 LAB — POCT INR: INR: 3 (ref 2.0–3.0)

## 2018-04-12 NOTE — Patient Instructions (Addendum)
Pre visit review using our clinic review tool, if applicable. No additional management support is needed unless otherwise documented below in the visit note.  Take 1 tablet on Sun/Tues/Thurs/Sat. and take 2 tablets on Monday/Fridays and take 1 1/2 tablets on Wednesdays.  Re-check in 6 weeks.

## 2018-04-12 NOTE — Patient Instructions (Signed)
Good to see you  Ice is your friend Stay active See me again when you need me Happy holidays!

## 2018-04-12 NOTE — Assessment & Plan Note (Signed)
Injection given today.  Discussed icing regimen and home exercise.  Discussed which activities of doing which wants to avoid.  Follow-up again in 4 to 8 weeks could be candidate for Visco supplementation.

## 2018-04-16 ENCOUNTER — Encounter: Payer: Self-pay | Admitting: Family Medicine

## 2018-04-18 NOTE — Progress Notes (Signed)
Advanced Heart Failure Clinic Note PCP: Primary Cardiologist: Dr. [facility]   HPI: [name] is a 62 y.o. male with history of longstanding cardiomyopathy out of proportion to CAD/chronic systolic CHF (EF 67-89% 07/8099), prior VT, s/p St Jude ICD, CAD, CVA, OSA on CPAP, pulmonary HTN on O2 at home, PAF on coumadin, HTN, and CKD stage 3.  Admitted from Dr Hochrein's office 10/01/17 with volume overload. HF team was consulted. Underwent R/LHC 10/04/17 - full report below. PYP scan and myeloma panel completed and were not suggestive of cardiac amyloidosis. V/Q scan completed for elevated PA pressures on cath, which was negative. He diuresed 31 lbs with IV lasix and metolazone and transitioned to torsemide 60 mg daily. HF meds were optimized. No ACE/ARB with CKD and soft BPs. Discharge weight 269 pounds.  He returns today for HF follow up. He is feeling poorly. He finished cardiac rehab in November, but continues to exercise at home. He is able to walk 15-20 minutes before getting SOB. He does poorly if he does not have his O2 on. Wears 2L at rest and 3L with activity. Now wearing O2 most of the time and has O2 concentrator. Denies edema, orthopnea, and PND. Appetite is poor. No cough. In the last week, he took nitro on 3 different occasions. He had mid chest discomfort after exertion. No associated symptoms. Symptoms resolved after 5 minutes rest + 1 SL nitro. Weights ~259 lbs at home, down 7 lbs on our scale. Taking all medications, but stopped torsemide on Tuesday, 12/10. He got a shot in his knee and Dr Tamala Julian mentioned that his BP was on low side and he should maybe cut back torsemide to 40 mg daily.   ICD interrogation: Thoracic impedence below threshold over the last week, indicating volume overload.   V/Q scan 10/07/17:No appreciable ventilation or perfusion defects.  PYP 10/05/17:Visual and quantitative assessment (grade 0/1, H/CLL equal 1.3) are equivocal for  transthyretin amyloidosis.  LHC 10/04/17: Left Main  No significant disease.  Left Anterior Descending  Large, wrap-around LAD with 30% mid vessel stenosis.  Left Circumflex  50-60% distal LCx stenosis.  Right Coronary Artery  50% mid RCA stenosis.   [place] 10/04/17: RA mean 19 RV 72/23 PA 74/37, mean 51 PCWP mean 19 LV 111/28 AO 112/80 Oxygen saturations: PA 55% AO 95% Cardiac Output (Fick) 4.68  Cardiac Index (Fick) 1.9  PVR 6.8 WU CVP/PCWP 1 PAPi 1.95 1. Nonischemic cardiomyopathy. Coronary disease well out of proportion to degree of cardiomyopathy.  2. Elevated R>L heart filling pressures, suggestive of significant RV failure with high CVP/PCWP ratio and PAPi <2.  3. Low cardiac output.  4. Mixed pulmonary venous/pulmonary arterial hypertension, ?due to hypoxemia from OSA or OHS/OSA.   Review of systems complete and found to be negative unless listed in HPI.   SH:  Social History   Socioeconomic History  . Marital status: Married    Spouse name: Not on file  . Number of children: 2  . Years of education: Not on file  . Highest education level: Not on file  Occupational History  . Occupation: medically retired due to heart failure  Social Needs  . Financial resource strain: Not on file  . Food insecurity:    Worry: Not on file    Inability: Not on file  . Transportation needs:    Medical: Yes    Non-medical: No  Tobacco Use  . Smoking status: Former Smoker    Last attempt  to quit: 05/04/1986    Years since quitting: 31.9  . Smokeless tobacco: Never Used  . Tobacco comment: Quit smoking 30 yrs ago. Smoked as teenager less than 1/2 ppd. Smoked x 4 years.  Substance and Sexual Activity  . Alcohol use: No    Comment: occasionally  . Drug use: No  . Sexual activity: Never  Lifestyle  . Physical activity:    Days per week: 0 days    Minutes per session: 0 min  . Stress: Not at all  Relationships  . Social connections:    Talks on phone: Not on file     Gets together: Not on file    Attends religious service: Not on file    Active member of club or organization: Not on file    Attends meetings of clubs or organizations: Not on file    Relationship status: Not on file  . Intimate partner violence:    Fear of current or ex partner: Not on file    Emotionally abused: Not on file    Physically abused: Not on file    Forced sexual activity: Not on file  Other Topics Concern  . Not on file  Social History Narrative  . Not on file    FH:  Family History  Problem Relation Age of Onset  . Coronary artery disease Mother   . Heart attack Mother   . Heart attack Maternal Uncle   . Heart attack Maternal Grandmother     Past Medical History:  Diagnosis Date  . CAD (coronary artery disease)    a. Initial nonobst by cath 2009. b. Cath 01/2013 in setting of VT storm: obstructive distal Cx disease (small and terminates in the AV groove, unlikely to cause significant ischemia or electrical instability), nonobstructive RCA disease, EF 15-20%.   . Cerebrovascular accident [facility])    a. 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 Chronic systolic CHF (congestive heart failure) ([place])    a. Likely NICM (out of proportion to CAD). b. 2009 - EF 25-30% by echo, 01/2013: 15-20% by cath. c. 11/2014 Echo: EF 35-40%, Gr1 DD, mild MR, mod TR, PASP 33mmHg.  . CKD (chronic kidney disease), stage II   . Dyslipidemia   . Gout   . HTN (hypertension)   . Hypokalemia   . ICD (implantable cardiac defibrillator) in place   . Insulin dependent diabetes mellitus ([place])   . Lipoma   . Nonischemic cardiomyopathy ([place])    a.  11/2014 Echo: EF 35-40%, Gr1 DD, mild MR, mod TR, PASP 26mmHg.  . OSA (obstructive sleep apnea)    does not wear cpap  . PAF (paroxysmal atrial fibrillation) ([place])    a. Noted 05/2008 by EKG;  b. CHA2DS2VASc = 5-6-->coumadin.  . Paroxysmal VT ([place])    a. s/p St. Jude ICD 2007. b. H/o paroxysmal VT/VF including VT storm 12/2012 admission prompting amio  initiation;  c. 01/2013 ICD upgrade SJM 1411-36Q Ellipse VR single lead ICD.  [name] Pulmonary HTN ([place])    a. Mild by cath 01/2013.    Current Outpatient Medications  Medication Sig Dispense Refill  . acetaminophen (TYLENOL) 500 MG tablet Take 500-1,000 mg by mouth every 6 (six) hours as needed (for bilateral knee pain).    [name] allopurinol (ZYLOPRIM) 300 MG tablet Take 1 tablet (300 mg total) by mouth daily. 90 tablet 3  . amiodarone (PACERONE) 200 MG tablet Take 1 tablet (200 mg total) by mouth daily. 90 tablet 3  . aspirin EC  81 MG EC tablet Take 1 tablet (81 mg total) by mouth daily. 30 tablet 0  . atorvastatin (LIPITOR) 80 MG tablet Take 1 tablet (80 mg total) by mouth at bedtime. 90 tablet 3  . carvedilol (COREG) 25 MG tablet Take 0.5 tablets (12.5 mg total) by mouth 2 (two) times daily with a meal. 90 tablet 3  . cholecalciferol (VITAMIN D) 1000 units tablet Take 1,000 Units by mouth daily.    . digoxin (LANOXIN) 0.125 MG tablet Take 1 tablet (0.125 mg total) by mouth every other day. 45 tablet 3  . Insulin Glargine (LANTUS SOLOSTAR) 100 UNIT/ML Solostar Pen INJECT 10 UNITS INTO THE SKIN AT BEDTIME. 15 pen 2  . Insulin Pen Needle (B-D ULTRAFINE III SHORT PEN) 31G X 8 MM MISC USE ONCE DAILY WITH INSULIN 100 each 3  . Insulin Pen Needle (PEN NEEDLES) 31G X 5 MM MISC 100 each by Does not apply route 2 (two) times daily. 100 each 5  . isosorbide-hydrALAZINE (BIDIL) 20-37.5 MG tablet Take 1 tablet by mouth 3 (three) times daily. 270 tablet 3  . LANTUS SOLOSTAR 100 UNIT/ML Solostar Pen INJECT 10 UNITS INTO THE SKIN AT BEDTIME. 15 pen 3  . nitroGLYCERIN (NITROSTAT) 0.4 MG SL tablet PLACE 1 TABLET UNDER TONGUE EVERY 5 MINS X3 DOSES AS NEEDED FOR CHEST PAIN 25 tablet 11  . potassium chloride SA (K-DUR,KLOR-CON) 20 MEQ tablet Take 1 tablet (20 mEq total) by mouth daily. 90 tablet 3  . spironolactone (ALDACTONE) 25 MG tablet Take 1 tablet (25 mg total) by mouth daily. 90 tablet 3  . torsemide (DEMADEX) 20  MG tablet Take 3 tablets (60 mg total) by mouth daily. Take extra 20 mg tablet once daily AS NEEDED for weight gain 3 lbs or more in 24 hrs. 360 tablet 3  . traMADol (ULTRAM) 50 MG tablet Take 1 tablet (50 mg total) by mouth every 8 (eight) hours as needed. 60 tablet 2  . Turmeric 500 MG CAPS Take 1,000 mg by mouth daily as needed (inflammation).     . warfarin (COUMADIN) 5 MG tablet Take 5-10 mg by mouth See admin instructions. Take 5mg  every day except take 10mg  Mondays and Fridays and take 7.5mg  Wednesdays     No current facility-administered medications for this encounter.    Vitals:   04/19/18 1113  BP: 104/72  Pulse: 84  SpO2: 90%  Weight: 119.7 kg (264 lb)   Wt Readings from Last 3 Encounters:  04/19/18 119.7 kg (264 lb)  04/12/18 118.8 kg (262 lb)  03/16/18 123.6 kg (272 lb 7.8 oz)    PHYSICAL EXAM: General: Appears fatigued. No resp difficulty. HEENT: Normal, periorbital edema Neck: Supple. JVP 7-8. Carotids 2+ bilat; no bruits. No thyromegaly or nodule noted. Cor: PMI nondisplaced. RRR, No M/G/R noted Lungs: CTAB, normal effort. On 2L O2 Abdomen: Soft, non-tender, non-distended, no HSM. No bruits or masses. +BS  Extremities: No cyanosis, clubbing, or rash. R and LLE no edema.  Neuro: Alert & orientedx3, cranial nerves grossly intact. moves all 4 extremities w/o difficulty. Affect pleasant  EKG: NSR with 1st degree AV block, IVCD with QRS 138 ms, 70 bpm. No changes when compared to previous.   ASSESSMENT & PLAN: 1. Chronic Systolic HF due to NICM - St Jude ICD  - ECHO 07/28/2017 EF 20-25%. RV mildly dilated.  - NYHA II-III symptoms - Volume status elevated. He stopped taking his torsemide last Tuesday. - Resume torsemide 60 mg daily. He can take an extra  20 mg PRN 3 lb weight gain. Considered cutting back to 40 mg daily, but pt feels that he was doing well on 60 mg daily. BMET and BNP today.  - Continue carvedilol 12.5 mg twice a day.  - Continue digoxin 0.125 mg  daily. Check dig level today.  - Continue bidil 1 tab three times a day.  - Continue spiro 25 mg daily. - Reinforced fluid restriction to < 2 L daily, sodium restriction to less than 2000 mg daily, and the importance of daily weights.    2. CKD stage 3 - Baseline creatinine ~1.9. Check BMET today.   3. CAD - He had 3 episodes of chest discomfort after exertion last week, resolved with SL nitro. He had a LHC 10/2017 with 50-60% distal left circ and 50% mid RCA stenosis. No significant disease left main or LAD. Discussed with Dr Aundra Dubin. Expect symptoms to improve with resuming diuretics. Instructed patient to let us know if symptoms do not improve. - Continue statin and bb.  - On coumadin. Denies bleeding.  4. PAF  - On coumadin, followed by coumadin clinic on [place].  - Regular on exam. Denies bleeding.   6. Hx of VT - Has St Jude ICD.  7. Pulmonary HTN - Continue home O2 (2L).   Follow up in 3 weeks.  Check BMET, BNP, dig level today Resume torsemide 60 mg daily.   Georgiana Shore, NP  11:19 AM  Greater than 50% of the 25 minute visit was spent in counseling/coordination of care regarding disease state education, salt/fluid restriction, sliding scale diuretics, and medication compliance.

## 2018-04-19 ENCOUNTER — Encounter (HOSPITAL_COMMUNITY): Payer: Self-pay

## 2018-04-19 ENCOUNTER — Ambulatory Visit (HOSPITAL_COMMUNITY)
Admission: RE | Admit: 2018-04-19 | Discharge: 2018-04-19 | Disposition: A | Discharge status: Home / Self Care | Payer: Medicare Other | Source: Ambulatory Visit | Attending: Cardiology | Admitting: Cardiology

## 2018-04-19 VITALS — BP 104/72 | HR 84 | Wt 264.0 lb

## 2018-04-19 DIAGNOSIS — Z87891 Personal history of nicotine dependence: Secondary | ICD-10-CM | POA: Insufficient documentation

## 2018-04-19 DIAGNOSIS — I480 Paroxysmal atrial fibrillation: Secondary | ICD-10-CM | POA: Diagnosis not present

## 2018-04-19 DIAGNOSIS — N183 Chronic kidney disease, stage 3 unspecified: Secondary | ICD-10-CM

## 2018-04-19 DIAGNOSIS — I428 Other cardiomyopathies: Secondary | ICD-10-CM | POA: Diagnosis not present

## 2018-04-19 DIAGNOSIS — I2510 Atherosclerotic heart disease of native coronary artery without angina pectoris: Secondary | ICD-10-CM | POA: Diagnosis not present

## 2018-04-19 DIAGNOSIS — Z95810 Presence of automatic (implantable) cardiac defibrillator: Secondary | ICD-10-CM | POA: Diagnosis not present

## 2018-04-19 DIAGNOSIS — Z9981 Dependence on supplemental oxygen: Secondary | ICD-10-CM | POA: Insufficient documentation

## 2018-04-19 DIAGNOSIS — Z7901 Long term (current) use of anticoagulants: Secondary | ICD-10-CM | POA: Insufficient documentation

## 2018-04-19 DIAGNOSIS — I440 Atrioventricular block, first degree: Secondary | ICD-10-CM | POA: Insufficient documentation

## 2018-04-19 DIAGNOSIS — I5042 Chronic combined systolic (congestive) and diastolic (congestive) heart failure: Secondary | ICD-10-CM

## 2018-04-19 DIAGNOSIS — I272 Pulmonary hypertension, unspecified: Secondary | ICD-10-CM | POA: Diagnosis not present

## 2018-04-19 DIAGNOSIS — I5022 Chronic systolic (congestive) heart failure: Secondary | ICD-10-CM | POA: Insufficient documentation

## 2018-04-19 DIAGNOSIS — I2721 Secondary pulmonary arterial hypertension: Secondary | ICD-10-CM | POA: Insufficient documentation

## 2018-04-19 DIAGNOSIS — I472 Ventricular tachycardia, unspecified: Secondary | ICD-10-CM

## 2018-04-19 DIAGNOSIS — I130 Hypertensive heart and chronic kidney disease with heart failure and stage 1 through stage 4 chronic kidney disease, or unspecified chronic kidney disease: Secondary | ICD-10-CM | POA: Diagnosis not present

## 2018-04-19 DIAGNOSIS — Z7982 Long term (current) use of aspirin: Secondary | ICD-10-CM | POA: Insufficient documentation

## 2018-04-19 DIAGNOSIS — G4733 Obstructive sleep apnea (adult) (pediatric): Secondary | ICD-10-CM | POA: Insufficient documentation

## 2018-04-19 DIAGNOSIS — Z8249 Family history of ischemic heart disease and other diseases of the circulatory system: Secondary | ICD-10-CM | POA: Insufficient documentation

## 2018-04-19 DIAGNOSIS — E785 Hyperlipidemia, unspecified: Secondary | ICD-10-CM | POA: Insufficient documentation

## 2018-04-19 DIAGNOSIS — Z8673 Personal history of transient ischemic attack (TIA), and cerebral infarction without residual deficits: Secondary | ICD-10-CM | POA: Diagnosis not present

## 2018-04-19 DIAGNOSIS — I454 Nonspecific intraventricular block: Secondary | ICD-10-CM | POA: Diagnosis not present

## 2018-04-19 DIAGNOSIS — E1122 Type 2 diabetes mellitus with diabetic chronic kidney disease: Secondary | ICD-10-CM | POA: Insufficient documentation

## 2018-04-19 DIAGNOSIS — M109 Gout, unspecified: Secondary | ICD-10-CM | POA: Diagnosis not present

## 2018-04-19 DIAGNOSIS — I491 Atrial premature depolarization: Secondary | ICD-10-CM | POA: Diagnosis not present

## 2018-04-19 DIAGNOSIS — Z794 Long term (current) use of insulin: Secondary | ICD-10-CM | POA: Diagnosis not present

## 2018-04-19 LAB — BASIC METABOLIC PANEL
Anion gap: 12 (ref 5–15)
BUN: 53 mg/dL — AB (ref 8–23)
CALCIUM: 8.9 mg/dL (ref 8.9–10.3)
CO2: 24 mmol/L (ref 22–32)
CREATININE: 2.89 mg/dL — AB (ref 0.61–1.24)
Chloride: 106 mmol/L (ref 98–111)
GFR calc Af Amer: 26 mL/min — ABNORMAL LOW (ref >60)
GFR, EST NON AFRICAN AMERICAN: 22 mL/min — AB (ref >60)
GLUCOSE: 103 mg/dL — AB (ref 70–99)
POTASSIUM: 4.8 mmol/L (ref 3.5–5.1)
SODIUM: 142 mmol/L (ref 135–145)

## 2018-04-19 LAB — DIGOXIN LEVEL: DIGOXIN LVL: 1.5 ng/mL (ref 0.8–2.0)

## 2018-04-19 MED ORDER — TORSEMIDE 20 MG PO TABS: 60.0000 mg | ORAL_TABLET | Freq: Every day | ORAL | 3 refills | Status: DC

## 2018-04-19 NOTE — Patient Instructions (Signed)
Labs done today  RESUME Torsemide 60mg  (1 tab) daily  Follow up with the Advance Practice Providers in 3 weeks

## 2018-04-20 ENCOUNTER — Telehealth (HOSPITAL_COMMUNITY): Payer: Self-pay | Admitting: Cardiology

## 2018-04-20 DIAGNOSIS — I5042 Chronic combined systolic (congestive) and diastolic (congestive) heart failure: Secondary | ICD-10-CM

## 2018-04-20 MED ORDER — TORSEMIDE 20 MG PO TABS: 40.0000 mg | ORAL_TABLET | Freq: Every day | ORAL | 3 refills | Status: DC

## 2018-04-20 NOTE — Telephone Encounter (Signed)
Notes recorded by Kerry Dory, CMA on 04/20/2018 at 9:11 AM EST Patient aware. Patient voiced understanding, repeat labs 12/26  ------  Notes recorded by Kerry Dory, CMA on 04/19/2018 at 4:12 PM EST Left message for patient to call back.  [phone] (M ------  Notes recorded by Georgiana Shore, NP on 04/19/2018 at 1:23 PM EST Renal function is up. Have him take torsemide 40 mg daily instead of 60 mg daily. Dig level is high. Please clarify if he is taking daily or every other day. If taking every other day, will need to stop for now. If taking daily, decrease to every other day and recheck dig level next week (make sure he does not take morning of lab draw). Needs a repeat BMET and BNP early next week. Thank you!

## 2018-04-20 NOTE — Telephone Encounter (Signed)
-----   Message from Georgiana Shore, NP sent at 04/19/2018  1:23 PM EST ----- Renal function is up. Have him take torsemide 40 mg daily instead of 60 mg daily. Dig level is high. Please clarify if he is taking daily or every other day. If taking every other day, will need to stop for now. If taking daily, decrease to every other day and recheck dig level next week (make sure he does not take morning of lab draw). Needs a repeat BMET and BNP early next week. Thank you!

## 2018-04-20 NOTE — Addendum Note (Signed)
Addended by: Finneus Kaneshiro, Sharlot Gowda on: 04/20/2018 09:14 AM   Modules accepted: Orders

## 2018-04-25 DIAGNOSIS — R0989 Other specified symptoms and signs involving the circulatory and respiratory systems: Secondary | ICD-10-CM | POA: Diagnosis not present

## 2018-04-25 DIAGNOSIS — R1313 Dysphagia, pharyngeal phase: Secondary | ICD-10-CM | POA: Diagnosis not present

## 2018-04-26 ENCOUNTER — Other Ambulatory Visit: Payer: Self-pay | Admitting: Otolaryngology

## 2018-04-26 DIAGNOSIS — R1313 Dysphagia, pharyngeal phase: Secondary | ICD-10-CM

## 2018-04-28 ENCOUNTER — Ambulatory Visit (HOSPITAL_COMMUNITY)
Admission: RE | Admit: 2018-04-28 | Discharge: 2018-04-28 | Disposition: A | Discharge status: Home / Self Care | Payer: Medicare Other | Source: Ambulatory Visit | Attending: Internal Medicine | Admitting: Internal Medicine

## 2018-04-28 DIAGNOSIS — I5042 Chronic combined systolic (congestive) and diastolic (congestive) heart failure: Secondary | ICD-10-CM | POA: Diagnosis not present

## 2018-04-28 LAB — BASIC METABOLIC PANEL
Anion gap: 8 (ref 5–15)
BUN: 39 mg/dL — ABNORMAL HIGH (ref 8–23)
CO2: 25 mmol/L (ref 22–32)
Calcium: 8.8 mg/dL — ABNORMAL LOW (ref 8.9–10.3)
Chloride: 108 mmol/L (ref 98–111)
Creatinine, Ser: 2.43 mg/dL — ABNORMAL HIGH (ref 0.61–1.24)
GFR calc Af Amer: 32 mL/min — ABNORMAL LOW (ref >60)
GFR, EST NON AFRICAN AMERICAN: 27 mL/min — AB (ref >60)
Glucose, Bld: 106 mg/dL — ABNORMAL HIGH (ref 70–99)
Potassium: 4.5 mmol/L (ref 3.5–5.1)
Sodium: 141 mmol/L (ref 135–145)

## 2018-04-28 LAB — DIGOXIN LEVEL: DIGOXIN LVL: 0.2 ng/mL — AB (ref 0.8–2.0)

## 2018-04-28 LAB — BRAIN NATRIURETIC PEPTIDE: B NATRIURETIC PEPTIDE 5: 1354.2 pg/mL — AB (ref 0.0–100.0)

## 2018-05-02 DIAGNOSIS — I5043 Acute on chronic combined systolic (congestive) and diastolic (congestive) heart failure: Secondary | ICD-10-CM | POA: Diagnosis not present

## 2018-05-03 LAB — CUP PACEART REMOTE DEVICE CHECK
Battery Remaining Longevity: 67 mo
Battery Remaining Percentage: 66 %
Battery Voltage: 2.96 V
Brady Statistic RV Percent Paced: 1 %
Date Time Interrogation Session: 20191028143817
HIGH POWER IMPEDANCE MEASURED VALUE: 48 Ohm
HighPow Impedance: 48 Ohm
Implantable Lead Model: 7001
Implantable Pulse Generator Implant Date: 20150914
Lead Channel Impedance Value: 390 Ohm
Lead Channel Pacing Threshold Amplitude: 1 V
Lead Channel Setting Pacing Amplitude: 2.5 V
Lead Channel Setting Pacing Pulse Width: 0.7 ms
Lead Channel Setting Sensing Sensitivity: 0.5 mV
MDC IDC LEAD IMPLANT DT: 20070817
MDC IDC LEAD LOCATION: 753860
MDC IDC MSMT LEADCHNL RV PACING THRESHOLD PULSEWIDTH: 0.7 ms
MDC IDC MSMT LEADCHNL RV SENSING INTR AMPL: 12 mV
Pulse Gen Serial Number: 7214340

## 2018-05-10 ENCOUNTER — Ambulatory Visit
Admission: RE | Admit: 2018-05-10 | Discharge: 2018-05-10 | Disposition: A | Discharge status: Home / Self Care | Payer: Medicare Other | Source: Ambulatory Visit | Attending: Otolaryngology | Admitting: Otolaryngology

## 2018-05-10 DIAGNOSIS — R1310 Dysphagia, unspecified: Secondary | ICD-10-CM | POA: Diagnosis not present

## 2018-05-10 DIAGNOSIS — R1313 Dysphagia, pharyngeal phase: Secondary | ICD-10-CM

## 2018-05-17 ENCOUNTER — Encounter (HOSPITAL_COMMUNITY): Payer: Self-pay

## 2018-05-17 ENCOUNTER — Ambulatory Visit (HOSPITAL_COMMUNITY)
Admission: RE | Admit: 2018-05-17 | Discharge: 2018-05-17 | Disposition: A | Discharge status: Home / Self Care | Payer: Medicare Other | Source: Ambulatory Visit | Attending: Cardiology | Admitting: Cardiology

## 2018-05-17 ENCOUNTER — Other Ambulatory Visit: Payer: Self-pay

## 2018-05-17 VITALS — BP 108/76 | HR 77 | Wt 267.0 lb

## 2018-05-17 DIAGNOSIS — I480 Paroxysmal atrial fibrillation: Secondary | ICD-10-CM

## 2018-05-17 DIAGNOSIS — I5042 Chronic combined systolic (congestive) and diastolic (congestive) heart failure: Secondary | ICD-10-CM | POA: Diagnosis not present

## 2018-05-17 DIAGNOSIS — Z79899 Other long term (current) drug therapy: Secondary | ICD-10-CM | POA: Diagnosis not present

## 2018-05-17 DIAGNOSIS — I130 Hypertensive heart and chronic kidney disease with heart failure and stage 1 through stage 4 chronic kidney disease, or unspecified chronic kidney disease: Secondary | ICD-10-CM | POA: Insufficient documentation

## 2018-05-17 DIAGNOSIS — G4733 Obstructive sleep apnea (adult) (pediatric): Secondary | ICD-10-CM | POA: Insufficient documentation

## 2018-05-17 DIAGNOSIS — Z8673 Personal history of transient ischemic attack (TIA), and cerebral infarction without residual deficits: Secondary | ICD-10-CM | POA: Insufficient documentation

## 2018-05-17 DIAGNOSIS — I2510 Atherosclerotic heart disease of native coronary artery without angina pectoris: Secondary | ICD-10-CM | POA: Diagnosis not present

## 2018-05-17 DIAGNOSIS — I428 Other cardiomyopathies: Secondary | ICD-10-CM | POA: Diagnosis not present

## 2018-05-17 DIAGNOSIS — Z794 Long term (current) use of insulin: Secondary | ICD-10-CM | POA: Diagnosis not present

## 2018-05-17 DIAGNOSIS — Z7901 Long term (current) use of anticoagulants: Secondary | ICD-10-CM | POA: Insufficient documentation

## 2018-05-17 DIAGNOSIS — I10 Essential (primary) hypertension: Secondary | ICD-10-CM

## 2018-05-17 DIAGNOSIS — E1122 Type 2 diabetes mellitus with diabetic chronic kidney disease: Secondary | ICD-10-CM | POA: Insufficient documentation

## 2018-05-17 DIAGNOSIS — Z9981 Dependence on supplemental oxygen: Secondary | ICD-10-CM | POA: Insufficient documentation

## 2018-05-17 DIAGNOSIS — M109 Gout, unspecified: Secondary | ICD-10-CM | POA: Diagnosis not present

## 2018-05-17 DIAGNOSIS — Z8249 Family history of ischemic heart disease and other diseases of the circulatory system: Secondary | ICD-10-CM | POA: Diagnosis not present

## 2018-05-17 DIAGNOSIS — I2721 Secondary pulmonary arterial hypertension: Secondary | ICD-10-CM | POA: Insufficient documentation

## 2018-05-17 DIAGNOSIS — Z87891 Personal history of nicotine dependence: Secondary | ICD-10-CM | POA: Diagnosis not present

## 2018-05-17 DIAGNOSIS — Z7982 Long term (current) use of aspirin: Secondary | ICD-10-CM | POA: Diagnosis not present

## 2018-05-17 DIAGNOSIS — N179 Acute kidney failure, unspecified: Secondary | ICD-10-CM | POA: Diagnosis not present

## 2018-05-17 DIAGNOSIS — I272 Pulmonary hypertension, unspecified: Secondary | ICD-10-CM

## 2018-05-17 DIAGNOSIS — Z95810 Presence of automatic (implantable) cardiac defibrillator: Secondary | ICD-10-CM | POA: Diagnosis not present

## 2018-05-17 DIAGNOSIS — N183 Chronic kidney disease, stage 3 unspecified: Secondary | ICD-10-CM

## 2018-05-17 DIAGNOSIS — E785 Hyperlipidemia, unspecified: Secondary | ICD-10-CM | POA: Diagnosis not present

## 2018-05-17 LAB — BASIC METABOLIC PANEL
Anion gap: 10 (ref 5–15)
BUN: 43 mg/dL — ABNORMAL HIGH (ref 8–23)
CO2: 27 mmol/L (ref 22–32)
CREATININE: 2.36 mg/dL — AB (ref 0.61–1.24)
Calcium: 8.8 mg/dL — ABNORMAL LOW (ref 8.9–10.3)
Chloride: 104 mmol/L (ref 98–111)
GFR calc Af Amer: 33 mL/min — ABNORMAL LOW (ref >60)
GFR calc non Af Amer: 28 mL/min — ABNORMAL LOW (ref >60)
Glucose, Bld: 120 mg/dL — ABNORMAL HIGH (ref 70–99)
Potassium: 4.4 mmol/L (ref 3.5–5.1)
Sodium: 141 mmol/L (ref 135–145)

## 2018-05-17 NOTE — Patient Instructions (Signed)
Labs were done today. We will call you if there are any abnormal results.  Your physician recommends that you schedule a follow-up appointment in 2 months

## 2018-05-17 NOTE — Progress Notes (Signed)
Advanced Heart Failure Clinic Note PCP: Primary Cardiologist: Dr. [facility]   HPI: [name] is a 63 y.o. male with history of longstanding cardiomyopathy out of proportion to CAD/chronic systolic CHF (EF 93-81% 0/1751), prior VT, s/p St Jude ICD, CAD, CVA, OSA on CPAP, pulmonary HTN on O2 at home, PAF on coumadin, HTN, and CKD stage 3.  Admitted from Dr Hochrein's office 10/01/17 with volume overload. HF team was consulted. Underwent R/LHC 10/04/17 - full report below. PYP scan and myeloma panel completed and were not suggestive of cardiac amyloidosis. V/Q scan completed for elevated PA pressures on cath, which was negative. He diuresed 31 lbs with IV lasix and metolazone and transitioned to torsemide 60 mg daily. HF meds were optimized. No ACE/ARB with CKD and soft BPs. Discharge weight 269 pounds.  He presents today for regular follow up. Last visit torsemide increased, but deferred with AKI. He has been feeling OK since then. Has been taking torsemide 40 mg q am, with 20 mg extra in the afternoon several times a week. No further chest pain with extra torsemide as needed.  He is wearing O2 most of the time, but doesn't always travel with it. Denies SOB with ADLs. Uses a shopping cart at the grocery, or a motorized cart at [place], but he is somewhat limited from his knees. He is only semi-compliant with his CPAP. Wearing for 3/4 of the night, and taking off it he gets up to go to the bathroom, without resuming it. He sleeps on 2 pillows chronically. Weight has been relatively stable at home. Taking all medication as directed.   ICD interrogation: No VT/VF. Thoracic impedence above threshold. Volume status relatively stable since beginning of January. Personally reviewed.   V/Q scan 10/07/17:No appreciable ventilation or perfusion defects.  PYP 10/05/17:Visual and quantitative assessment (grade 0/1, H/CLL equal 1.3) are equivocal for transthyretin amyloidosis.  LHC  10/04/17: Left Main  No significant disease.  Left Anterior Descending  Large, wrap-around LAD with 30% mid vessel stenosis.  Left Circumflex  50-60% distal LCx stenosis.  Right Coronary Artery  50% mid RCA stenosis.   [place] 10/04/17: RA mean 19 RV 72/23 PA 74/37, mean 51 PCWP mean 19 LV 111/28 AO 112/80 Oxygen saturations: PA 55% AO 95% Cardiac Output (Fick) 4.68  Cardiac Index (Fick) 1.9  PVR 6.8 WU CVP/PCWP 1 PAPi 1.95 1. Nonischemic cardiomyopathy. Coronary disease well out of proportion to degree of cardiomyopathy.  2. Elevated R>L heart filling pressures, suggestive of significant RV failure with high CVP/PCWP ratio and PAPi <2.  3. Low cardiac output.  4. Mixed pulmonary venous/pulmonary arterial hypertension, ?due to hypoxemia from OSA or OHS/OSA.   Review of systems complete and found to be negative unless listed in HPI.    SH:  Social History   Socioeconomic History  . Marital status: Married    Spouse name: Not on file  . Number of children: 2  . Years of education: Not on file  . Highest education level: Not on file  Occupational History  . Occupation: medically retired due to heart failure  Social Needs  . Financial resource strain: Not on file  . Food insecurity:    Worry: Not on file    Inability: Not on file  . Transportation needs:    Medical: Yes    Non-medical: No  Tobacco Use  . Smoking status: Former Smoker    Last attempt to quit: 05/04/1986    Years since quitting: 32.0  .  Smokeless tobacco: Never Used  . Tobacco comment: Quit smoking 30 yrs ago. Smoked as teenager less than 1/2 ppd. Smoked x 4 years.  Substance and Sexual Activity  . Alcohol use: No    Comment: occasionally  . Drug use: No  . Sexual activity: Never  Lifestyle  . Physical activity:    Days per week: 0 days    Minutes per session: 0 min  . Stress: Not at all  Relationships  . Social connections:    Talks on phone: Not on file    Gets together: Not on file     Attends religious service: Not on file    Active member of club or organization: Not on file    Attends meetings of clubs or organizations: Not on file    Relationship status: Not on file  . Intimate partner violence:    Fear of current or ex partner: Not on file    Emotionally abused: Not on file    Physically abused: Not on file    Forced sexual activity: Not on file  Other Topics Concern  . Not on file  Social History Narrative  . Not on file    FH:  Family History  Problem Relation Age of Onset  . Coronary artery disease Mother   . Heart attack Mother   . Heart attack Maternal Uncle   . Heart attack Maternal Grandmother     Past Medical History:  Diagnosis Date  . CAD (coronary artery disease)    a. Initial nonobst by cath 2009. b. Cath 01/2013 in setting of VT storm: obstructive distal Cx disease (small and terminates in the AV groove, unlikely to cause significant ischemia or electrical instability), nonobstructive RCA disease, EF 15-20%.   . Cerebrovascular accident [facility])    a. 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 Chronic systolic CHF (congestive heart failure) ([place])    a. Likely NICM (out of proportion to CAD). b. 2009 - EF 25-30% by echo, 01/2013: 15-20% by cath. c. 11/2014 Echo: EF 35-40%, Gr1 DD, mild MR, mod TR, PASP 63mmHg.  . CKD (chronic kidney disease), stage II   . Dyslipidemia   . Gout   . HTN (hypertension)   . Hypokalemia   . ICD (implantable cardiac defibrillator) in place   . Insulin dependent diabetes mellitus ([place])   . Lipoma   . Nonischemic cardiomyopathy ([place])    a.  11/2014 Echo: EF 35-40%, Gr1 DD, mild MR, mod TR, PASP 23mmHg.  . OSA (obstructive sleep apnea)    does not wear cpap  . PAF (paroxysmal atrial fibrillation) ([place])    a. Noted 05/2008 by EKG;  b. CHA2DS2VASc = 5-6-->coumadin.  . Paroxysmal VT ([place])    a. s/p St. Jude ICD 2007. b. H/o paroxysmal VT/VF including VT storm 12/2012 admission prompting amio initiation;  c. 01/2013 ICD upgrade  SJM 1411-36Q Ellipse VR single lead ICD.  [name] Pulmonary HTN ([place])    a. Mild by cath 01/2013.    Current Outpatient Medications  Medication Sig Dispense Refill  . acetaminophen (TYLENOL) 500 MG tablet Take 500-1,000 mg by mouth every 6 (six) hours as needed (for bilateral knee pain).    [name] allopurinol (ZYLOPRIM) 300 MG tablet Take 1 tablet (300 mg total) by mouth daily. 90 tablet 3  . amiodarone (PACERONE) 200 MG tablet Take 1 tablet (200 mg total) by mouth daily. 90 tablet 3  . aspirin EC 81 MG EC tablet Take 1 tablet (81 mg total) by mouth  daily. 30 tablet 0  . atorvastatin (LIPITOR) 80 MG tablet Take 1 tablet (80 mg total) by mouth at bedtime. 90 tablet 3  . carvedilol (COREG) 25 MG tablet Take 0.5 tablets (12.5 mg total) by mouth 2 (two) times daily with a meal. 90 tablet 3  . cholecalciferol (VITAMIN D) 1000 units tablet Take 1,000 Units by mouth daily.    . digoxin (LANOXIN) 0.125 MG tablet Take 1 tablet (0.125 mg total) by mouth every other day. 45 tablet 3  . Insulin Glargine (LANTUS SOLOSTAR) 100 UNIT/ML Solostar Pen INJECT 10 UNITS INTO THE SKIN AT BEDTIME. 15 pen 2  . Insulin Pen Needle (B-D ULTRAFINE III SHORT PEN) 31G X 8 MM MISC USE ONCE DAILY WITH INSULIN 100 each 3  . Insulin Pen Needle (PEN NEEDLES) 31G X 5 MM MISC 100 each by Does not apply route 2 (two) times daily. 100 each 5  . isosorbide-hydrALAZINE (BIDIL) 20-37.5 MG tablet Take 1 tablet by mouth 3 (three) times daily. 270 tablet 3  . LANTUS SOLOSTAR 100 UNIT/ML Solostar Pen INJECT 10 UNITS INTO THE SKIN AT BEDTIME. 15 pen 3  . nitroGLYCERIN (NITROSTAT) 0.4 MG SL tablet PLACE 1 TABLET UNDER TONGUE EVERY 5 MINS X3 DOSES AS NEEDED FOR CHEST PAIN 25 tablet 11  . potassium chloride SA (K-DUR,KLOR-CON) 20 MEQ tablet Take 1 tablet (20 mEq total) by mouth daily. 90 tablet 3  . spironolactone (ALDACTONE) 25 MG tablet Take 1 tablet (25 mg total) by mouth daily. 90 tablet 3  . torsemide (DEMADEX) 20 MG tablet Take 2 tablets (40 mg  total) by mouth daily. Take extra 20 mg tablet once daily AS NEEDED for weight gain 3 lbs or more in 24 hrs. 360 tablet 3  . traMADol (ULTRAM) 50 MG tablet Take 1 tablet (50 mg total) by mouth every 8 (eight) hours as needed. 60 tablet 2  . Turmeric 500 MG CAPS Take 1,000 mg by mouth daily as needed (inflammation).     . warfarin (COUMADIN) 5 MG tablet Take 5-10 mg by mouth See admin instructions. Take 5mg  every day except take 10mg  Mondays and Fridays and take 7.5mg  Wednesdays     No current facility-administered medications for this encounter.    Vitals:   05/17/18 1109  BP: 108/76  Pulse: 77  SpO2: 91%  Weight: 121.1 kg (267 lb)   Wt Readings from Last 3 Encounters:  05/17/18 121.1 kg (267 lb)  04/19/18 119.7 kg (264 lb)  04/12/18 118.8 kg (262 lb)    PHYSICAL EXAM: General: NAD HEENT: Normal Neck: Supple. JVP 7-8 cm Carotids 2+ bilat; no bruits. No thyromegaly or nodule noted. Cor: PMI nondisplaced. RRR, No M/G/R noted Lungs: CTAB, normal effort. On 2L 02 Abdomen: Soft, non-tender, non-distended, no HSM. No bruits or masses. +BS  Extremities: No cyanosis, clubbing, or rash. R and LLE no edema.  Neuro: Alert & orientedx3, cranial nerves grossly intact. moves all 4 extremities w/o difficulty. Affect pleasant   ASSESSMENT & PLAN: 1. Chronic Systolic HF due to NICM - St Jude ICD  - ECHO 07/28/2017 EF 20-25%. RV mildly dilated.  - NYHA II-III symptoms. - Volume status stable on exam.   - Continue torsemide 40 mg daily with extra 20 mg as needed.  - Continue carvedilol 12.5 mg twice a day.  - Continue digoxin 0.125 mg daily. Check dig level today.  - Continue bidil 1 tab three times a day.  - Continue spiro 25 mg daily. - Reinforced fluid restriction to <  2 L daily, sodium restriction to less than 2000 mg daily, and the importance of daily weights.    2. AKI on CKD stage 3 - Baseline creatinine ~1.9. Recent Cr 2.8 -> 2.4 - BMET today.   3. CAD - He has had no further  chest pain with diuresis.  - LHC 10/2017 with 50-60% distal left circ and 50% mid RCA stenosis. No significant disease left main or LAD.  - Continue statin and bb.  - On coumadin. Denies bleeding.  4. PAF  - On coumadin, followed by coumadin clinic on [place].  - Regular on exam. Denies bleeding.   6. Hx of VT - Has St Jude ICD. Interrogation as above.   7. Pulmonary HTN - Continue home O2 (2L).  Doesn't always take with him out.   8. OSA with CPAP - Moderate compliance with CPAP. Encouraged complete compliance.   Shirley Friar, PA-C  11:32 AM   Greater than 50% of the 25 minute visit was spent in counseling/coordination of care regarding disease state education, salt/fluid restriction, sliding scale diuretics, and medication compliance.

## 2018-05-24 ENCOUNTER — Ambulatory Visit (INDEPENDENT_AMBULATORY_CARE_PROVIDER_SITE_OTHER): Payer: Medicare Other | Admitting: General Practice

## 2018-05-24 DIAGNOSIS — Z7901 Long term (current) use of anticoagulants: Secondary | ICD-10-CM | POA: Diagnosis not present

## 2018-05-24 DIAGNOSIS — Z8679 Personal history of other diseases of the circulatory system: Secondary | ICD-10-CM

## 2018-05-24 DIAGNOSIS — I4891 Unspecified atrial fibrillation: Secondary | ICD-10-CM

## 2018-05-24 LAB — POCT INR: INR: 3.1 — AB (ref 2.0–3.0)

## 2018-05-24 NOTE — Patient Instructions (Addendum)
Pre visit review using our clinic review tool, if applicable. No additional management support is needed unless otherwise documented below in the visit note.  Skip dosage today and then change dosage and take 1 tablet daily except 2 tablets on Monday and Fridays.  Re-check in 4 weeks.

## 2018-06-02 DIAGNOSIS — I5043 Acute on chronic combined systolic (congestive) and diastolic (congestive) heart failure: Secondary | ICD-10-CM | POA: Diagnosis not present

## 2018-06-07 ENCOUNTER — Ambulatory Visit (INDEPENDENT_AMBULATORY_CARE_PROVIDER_SITE_OTHER): Payer: Medicare Other

## 2018-06-07 DIAGNOSIS — I5022 Chronic systolic (congestive) heart failure: Secondary | ICD-10-CM

## 2018-06-07 DIAGNOSIS — I472 Ventricular tachycardia, unspecified: Secondary | ICD-10-CM

## 2018-06-11 ENCOUNTER — Other Ambulatory Visit: Payer: Self-pay | Admitting: Emergency Medicine

## 2018-06-11 MED ORDER — ALLOPURINOL 300 MG PO TABS: 300.0000 mg | ORAL_TABLET | Freq: Every day | ORAL | 1 refills | Status: DC

## 2018-06-11 MED ORDER — AMIODARONE HCL 200 MG PO TABS: 200.0000 mg | ORAL_TABLET | Freq: Every day | ORAL | 1 refills | Status: DC

## 2018-06-11 MED ORDER — WARFARIN SODIUM 5 MG PO TABS: ORAL_TABLET | ORAL | 0 refills | Status: DC

## 2018-06-12 LAB — CUP PACEART REMOTE DEVICE CHECK
Battery Remaining Longevity: 66 mo
Battery Remaining Percentage: 65 %
Battery Voltage: 2.95 V
Brady Statistic RV Percent Paced: 1 %
HIGH POWER IMPEDANCE MEASURED VALUE: 46 Ohm
HighPow Impedance: 46 Ohm
Implantable Lead Implant Date: 20070817
Implantable Lead Location: 753860
Implantable Lead Model: 7001
Implantable Pulse Generator Implant Date: 20150914
Lead Channel Impedance Value: 390 Ohm
Lead Channel Pacing Threshold Amplitude: 1 V
Lead Channel Pacing Threshold Pulse Width: 0.7 ms
Lead Channel Sensing Intrinsic Amplitude: 11.8 mV
Lead Channel Setting Pacing Amplitude: 2.5 V
Lead Channel Setting Pacing Pulse Width: 0.7 ms
Lead Channel Setting Sensing Sensitivity: 0.5 mV
MDC IDC SESS DTM: 20200204132246
Pulse Gen Serial Number: 7214340

## 2018-06-16 NOTE — Progress Notes (Signed)
Remote ICD transmission.   

## 2018-06-21 ENCOUNTER — Ambulatory Visit (INDEPENDENT_AMBULATORY_CARE_PROVIDER_SITE_OTHER): Payer: Medicare Other | Admitting: General Practice

## 2018-06-21 DIAGNOSIS — Z7901 Long term (current) use of anticoagulants: Secondary | ICD-10-CM

## 2018-06-21 DIAGNOSIS — Z8679 Personal history of other diseases of the circulatory system: Secondary | ICD-10-CM

## 2018-06-21 DIAGNOSIS — I4891 Unspecified atrial fibrillation: Secondary | ICD-10-CM

## 2018-06-21 LAB — POCT INR: INR: 4.3 — AB (ref 2.0–3.0)

## 2018-06-21 NOTE — Patient Instructions (Addendum)
Pre visit review using our clinic review tool, if applicable. No additional management support is needed unless otherwise documented below in the visit note.  Skip dosage today and tomorrow and then  change dosage and take 1 tablet daily except 2 tablets on Monday only.  Re-check in 3 weeks.

## 2018-06-28 ENCOUNTER — Ambulatory Visit: Payer: Medicare Other | Admitting: Internal Medicine

## 2018-06-28 ENCOUNTER — Encounter: Payer: Self-pay | Admitting: Internal Medicine

## 2018-06-28 VITALS — BP 128/64 | HR 80 | Ht 71.0 in | Wt 263.0 lb

## 2018-06-28 DIAGNOSIS — I5022 Chronic systolic (congestive) heart failure: Secondary | ICD-10-CM | POA: Diagnosis not present

## 2018-06-28 DIAGNOSIS — Z95810 Presence of automatic (implantable) cardiac defibrillator: Secondary | ICD-10-CM

## 2018-06-28 DIAGNOSIS — I472 Ventricular tachycardia, unspecified: Secondary | ICD-10-CM

## 2018-06-28 NOTE — Patient Instructions (Signed)
Medication Instructions:  Your physician recommends that you continue on your current medications as directed. Please refer to the Current Medication list given to you today.  Labwork: None ordered.  Testing/Procedures: None ordered.  Follow-Up: Your physician wants you to follow-up in: one year with Dr. Lovena Le.   You will receive a reminder letter in the mail two months in advance. If you don't receive a letter, please call our office to schedule the follow-up appointment.  Remote monitoring is used to monitor your ICD from home. This monitoring reduces the number of office visits required to check your device to one time per year. It allows [place] to keep an eye on the functioning of your device to ensure it is working properly. You are scheduled for a device check from home on 09/13/2018. You may send your transmission at any time that day. If you have a wireless device, the transmission will be sent automatically. After your physician reviews your transmission, you will receive a postcard with your next transmission date.  Any Other Special Instructions Will Be Listed Below (If Applicable).  If you need a refill on your cardiac medications before your next appointment, please call your pharmacy.

## 2018-06-28 NOTE — Progress Notes (Signed)
HPI [name] returns today for followup. He is a pleasant 63 yo man with a h/o non-ischemic CM, chronic systolic heart failure and VT.  The patient denies chest pain or worsening shortness of breath or peripheral edema.  He has had no recurrent hospitalizations. He has had progression of his CHF. He is now on oxygen. No chest pain. His CHF has progressed to class 3 despite medical therapy. No recent ICD shocks.  No Known Allergies   Current Outpatient Medications  Medication Sig Dispense Refill  . acetaminophen (TYLENOL) 500 MG tablet Take 500-1,000 mg by mouth every 6 (six) hours as needed (for bilateral knee pain).    [name] allopurinol (ZYLOPRIM) 300 MG tablet Take 1 tablet (300 mg total) by mouth daily. 90 tablet 1  . amiodarone (PACERONE) 200 MG tablet Take 1 tablet (200 mg total) by mouth daily. 90 tablet 1  . aspirin EC 81 MG EC tablet Take 1 tablet (81 mg total) by mouth daily. 30 tablet 0  . atorvastatin (LIPITOR) 80 MG tablet Take 1 tablet (80 mg total) by mouth at bedtime. 90 tablet 3  . carvedilol (COREG) 25 MG tablet Take 0.5 tablets (12.5 mg total) by mouth 2 (two) times daily with a meal. 90 tablet 3  . cholecalciferol (VITAMIN D) 1000 units tablet Take 1,000 Units by mouth daily.    . digoxin (LANOXIN) 0.125 MG tablet Take 1 tablet (0.125 mg total) by mouth every other day. 45 tablet 3  . GLUCOMANNAN PO Take 665 mg by mouth every other day. Patient reports taking EOD    . Insulin Glargine (LANTUS SOLOSTAR) 100 UNIT/ML Solostar Pen INJECT 10 UNITS INTO THE SKIN AT BEDTIME. 15 pen 2  . Insulin Pen Needle (B-D ULTRAFINE III SHORT PEN) 31G X 8 MM MISC USE ONCE DAILY WITH INSULIN 100 each 3  . Insulin Pen Needle (PEN NEEDLES) 31G X 5 MM MISC 100 each by Does not apply route 2 (two) times daily. 100 each 5  . isosorbide-hydrALAZINE (BIDIL) 20-37.5 MG tablet Take 1 tablet by mouth 3 (three) times daily. 270 tablet 3  . LANTUS SOLOSTAR 100 UNIT/ML Solostar Pen INJECT 10 UNITS INTO  THE SKIN AT BEDTIME. 15 pen 3  . nitroGLYCERIN (NITROSTAT) 0.4 MG SL tablet PLACE 1 TABLET UNDER TONGUE EVERY 5 MINS X3 DOSES AS NEEDED FOR CHEST PAIN 25 tablet 11  . potassium chloride SA (K-DUR,KLOR-CON) 20 MEQ tablet Take 1 tablet (20 mEq total) by mouth daily. 90 tablet 3  . spironolactone (ALDACTONE) 25 MG tablet Take 1 tablet (25 mg total) by mouth daily. 90 tablet 3  . torsemide (DEMADEX) 20 MG tablet Take 2 tablets (40 mg total) by mouth daily. Take extra 20 mg tablet once daily AS NEEDED for weight gain 3 lbs or more in 24 hrs. 360 tablet 3  . traMADol (ULTRAM) 50 MG tablet Take 1 tablet (50 mg total) by mouth every 8 (eight) hours as needed. 60 tablet 2  . Turmeric 500 MG CAPS Take 1,000 mg by mouth daily as needed (inflammation).     . warfarin (COUMADIN) 5 MG tablet Take as directed by Coumadin clinic. 90 tablet 0   No current facility-administered medications for this visit.      Past Medical History:  Diagnosis Date  . CAD (coronary artery disease)    a. Initial nonobst by cath 2009. b. Cath 01/2013 in setting of VT storm: obstructive distal Cx disease (small and terminates in the AV groove,  unlikely to cause significant ischemia or electrical instability), nonobstructive RCA disease, EF 15-20%.   . Cerebrovascular accident [facility])    a. 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 Chronic systolic CHF (congestive heart failure) ([place])    a. Likely NICM (out of proportion to CAD). b. 2009 - EF 25-30% by echo, 01/2013: 15-20% by cath. c. 11/2014 Echo: EF 35-40%, Gr1 DD, mild MR, mod TR, PASP 28mmHg.  . CKD (chronic kidney disease), stage II   . Dyslipidemia   . Gout   . HTN (hypertension)   . Hypokalemia   . ICD (implantable cardiac defibrillator) in place   . Insulin dependent diabetes mellitus ([place])   . Lipoma   . Nonischemic cardiomyopathy ([place])    a.  11/2014 Echo: EF 35-40%, Gr1 DD, mild MR, mod TR, PASP 36mmHg.  . OSA (obstructive sleep apnea)    does not wear cpap  . PAF  (paroxysmal atrial fibrillation) ([place])    a. Noted 05/2008 by EKG;  b. CHA2DS2VASc = 5-6-->coumadin.  . Paroxysmal VT ([place])    a. s/p St. Jude ICD 2007. b. H/o paroxysmal VT/VF including VT storm 12/2012 admission prompting amio initiation;  c. 01/2013 ICD upgrade SJM 1411-36Q Ellipse VR single lead ICD.  [name] Pulmonary HTN ([place])    a. Mild by cath 01/2013.    ROS:   All systems reviewed and negative except as noted in the HPI.   Past Surgical History:  Procedure Laterality Date  . CARDIAC CATHETERIZATION     Nonobstructive coronary disease 2009  . CARDIAC DEFIBRILLATOR PLACEMENT     ICD-St. Jude  . IMPLANTABLE CARDIOVERTER DEFIBRILLATOR (ICD) GENERATOR CHANGE N/A 01/15/2014   Procedure: ICD GENERATOR CHANGE;  Surgeon: Evans Lance, MD;  Location[facility] CATH LAB;  Service: Cardiovascular;  Laterality: N/A;  . LEFT AND RIGHT HEART CATHETERIZATION WITH CORONARY ANGIOGRAM N/A 01/03/2013   Procedure: LEFT AND RIGHT HEART CATHETERIZATION WITH CORONARY ANGIOGRAM;  Surgeon: Peter M Martinique, MD;  Location: [facility] CATH LAB;  Service: Cardiovascular;  Laterality: N/A;  . LIPOMA EXCISION    . RIGHT/LEFT HEART CATH AND CORONARY ANGIOGRAPHY N/A 10/04/2017   Procedure: RIGHT/LEFT HEART CATH AND CORONARY ANGIOGRAPHY;  Surgeon: Larey Dresser, MD;  Location: [place] CV LAB;  Service: Cardiovascular;  Laterality: N/A;     Family History  Problem Relation Age of Onset  . Coronary artery disease Mother   . Heart attack Mother   . Heart attack Maternal Uncle   . Heart attack Maternal Grandmother      Social History   Socioeconomic History  . Marital status: Married    Spouse name: Not on file  . Number of children: 2  . Years of education: Not on file  . Highest education level: Not on file  Occupational History  . Occupation: medically retired due to heart failure  Social Needs  . Financial resource strain: Not on file  . Food insecurity:    Worry: Not on file    Inability: Not on file  .  Transportation needs:    Medical: Yes    Non-medical: No  Tobacco Use  . Smoking status: Former Smoker    Last attempt to quit: 05/04/1986    Years since quitting: 32.1  . Smokeless tobacco: Never Used  . Tobacco comment: Quit smoking 30 yrs ago. Smoked as teenager less than 1/2 ppd. Smoked x 4 years.  Substance and Sexual Activity  . Alcohol use: No    Comment: occasionally  . Drug use:  No  . Sexual activity: Never  Lifestyle  . Physical activity:    Days per week: 0 days    Minutes per session: 0 min  . Stress: Not at all  Relationships  . Social connections:    Talks on phone: Not on file    Gets together: Not on file    Attends religious service: Not on file    Active member of club or organization: Not on file    Attends meetings of clubs or organizations: Not on file    Relationship status: Not on file  . Intimate partner violence:    Fear of current or ex partner: Not on file    Emotionally abused: Not on file    Physically abused: Not on file    Forced sexual activity: Not on file  Other Topics Concern  . Not on file  Social History Narrative  . Not on file     BP 128/64   Pulse 80   Ht 5\' 11"  (1.803 m)   Wt 263 lb (119.3 kg)   SpO2 90%   BMI 36.68 kg/m   Physical Exam:  Chronically ill appearing NAD HEENT: Unremarkable Neck:  No JVD, no thyromegally Lymphatics:  No adenopathy Back:  No CVA tenderness Lungs:  Clear with no wheezes HEART:  Regular rate rhythm, no murmurs, no rubs, no clicks Abd:  soft, positive bowel sounds, no organomegally, no rebound, no guarding Ext:  2 plus pulses, no edema, no cyanosis, no clubbing Skin:  No rashes no nodules Neuro:  CN II through XII intact, motor grossly intact  DEVICE  Normal device function.  See PaceArt for details.   Assess/Plan: 1. Chronic systolic heart failure - his symptoms remain class 3. He will continue his low sodium diet. 2. ICD - his St. Jude single chamber ICD is working normally.  3.  Obesity - he is encouraged to lose weight.  4. VT - he has had no recurrent ventricular arrhythmias.  Mikle Bosworth.D.

## 2018-06-29 LAB — CUP PACEART INCLINIC DEVICE CHECK
Battery Remaining Longevity: 67 mo
Brady Statistic RV Percent Paced: 0.02 %
Date Time Interrogation Session: 20200225194702
HighPow Impedance: 46.3877
Implantable Pulse Generator Implant Date: 20150914
Lead Channel Impedance Value: 387.5 Ohm
Lead Channel Pacing Threshold Amplitude: 1.25 V
Lead Channel Pacing Threshold Amplitude: 1.25 V
Lead Channel Pacing Threshold Pulse Width: 0.7 ms
Lead Channel Pacing Threshold Pulse Width: 0.7 ms
Lead Channel Sensing Intrinsic Amplitude: 11.8 mV
Lead Channel Setting Pacing Amplitude: 2.5 V
Lead Channel Setting Pacing Pulse Width: 0.7 ms
Lead Channel Setting Sensing Sensitivity: 0.5 mV
MDC IDC LEAD IMPLANT DT: 20070817
MDC IDC LEAD LOCATION: 753860
Pulse Gen Serial Number: 7214340

## 2018-07-02 DIAGNOSIS — I5043 Acute on chronic combined systolic (congestive) and diastolic (congestive) heart failure: Secondary | ICD-10-CM | POA: Diagnosis not present

## 2018-07-07 ENCOUNTER — Other Ambulatory Visit: Payer: Self-pay | Admitting: Internal Medicine

## 2018-07-08 ENCOUNTER — Other Ambulatory Visit (HOSPITAL_COMMUNITY): Payer: Self-pay

## 2018-07-08 MED ORDER — ISOSORB DINITRATE-HYDRALAZINE 20-37.5 MG PO TABS: 1.0000 | ORAL_TABLET | Freq: Three times a day (TID) | ORAL | 3 refills | Status: DC

## 2018-07-12 ENCOUNTER — Ambulatory Visit (INDEPENDENT_AMBULATORY_CARE_PROVIDER_SITE_OTHER): Payer: Medicare Other | Admitting: General Practice

## 2018-07-12 DIAGNOSIS — Z7901 Long term (current) use of anticoagulants: Secondary | ICD-10-CM

## 2018-07-12 DIAGNOSIS — Z8679 Personal history of other diseases of the circulatory system: Secondary | ICD-10-CM

## 2018-07-12 LAB — POCT INR: INR: 1.9 — AB (ref 2.0–3.0)

## 2018-07-12 NOTE — Patient Instructions (Addendum)
Pre visit review using our clinic review tool, if applicable. No additional management support is needed unless otherwise documented below in the visit note.  Take 1 1/2 tablets today (3/10) and then continue to take 1 tablet daily except 2 tablets on Mondays only.  Re-check in 4 weeks.

## 2018-07-19 ENCOUNTER — Other Ambulatory Visit: Payer: Self-pay

## 2018-07-19 ENCOUNTER — Encounter (HOSPITAL_COMMUNITY): Payer: Self-pay

## 2018-07-19 ENCOUNTER — Ambulatory Visit (HOSPITAL_COMMUNITY)
Admission: RE | Admit: 2018-07-19 | Discharge: 2018-07-19 | Disposition: A | Discharge status: Home / Self Care | Payer: Medicare Other | Source: Ambulatory Visit | Attending: Cardiology | Admitting: Cardiology

## 2018-07-19 VITALS — BP 118/62 | HR 81 | Wt 268.0 lb

## 2018-07-19 DIAGNOSIS — N183 Chronic kidney disease, stage 3 unspecified: Secondary | ICD-10-CM

## 2018-07-19 DIAGNOSIS — I5022 Chronic systolic (congestive) heart failure: Secondary | ICD-10-CM | POA: Diagnosis not present

## 2018-07-19 DIAGNOSIS — I130 Hypertensive heart and chronic kidney disease with heart failure and stage 1 through stage 4 chronic kidney disease, or unspecified chronic kidney disease: Secondary | ICD-10-CM | POA: Diagnosis not present

## 2018-07-19 DIAGNOSIS — Z95810 Presence of automatic (implantable) cardiac defibrillator: Secondary | ICD-10-CM | POA: Diagnosis not present

## 2018-07-19 DIAGNOSIS — N184 Chronic kidney disease, stage 4 (severe): Secondary | ICD-10-CM | POA: Insufficient documentation

## 2018-07-19 DIAGNOSIS — I10 Essential (primary) hypertension: Secondary | ICD-10-CM

## 2018-07-19 DIAGNOSIS — Z794 Long term (current) use of insulin: Secondary | ICD-10-CM | POA: Insufficient documentation

## 2018-07-19 DIAGNOSIS — M109 Gout, unspecified: Secondary | ICD-10-CM | POA: Diagnosis not present

## 2018-07-19 DIAGNOSIS — E785 Hyperlipidemia, unspecified: Secondary | ICD-10-CM | POA: Insufficient documentation

## 2018-07-19 DIAGNOSIS — J9611 Chronic respiratory failure with hypoxia: Secondary | ICD-10-CM | POA: Insufficient documentation

## 2018-07-19 DIAGNOSIS — I428 Other cardiomyopathies: Secondary | ICD-10-CM | POA: Diagnosis not present

## 2018-07-19 DIAGNOSIS — Z8673 Personal history of transient ischemic attack (TIA), and cerebral infarction without residual deficits: Secondary | ICD-10-CM | POA: Diagnosis not present

## 2018-07-19 DIAGNOSIS — E1122 Type 2 diabetes mellitus with diabetic chronic kidney disease: Secondary | ICD-10-CM | POA: Insufficient documentation

## 2018-07-19 DIAGNOSIS — I2510 Atherosclerotic heart disease of native coronary artery without angina pectoris: Secondary | ICD-10-CM | POA: Insufficient documentation

## 2018-07-19 DIAGNOSIS — Z87891 Personal history of nicotine dependence: Secondary | ICD-10-CM | POA: Diagnosis not present

## 2018-07-19 DIAGNOSIS — I5042 Chronic combined systolic (congestive) and diastolic (congestive) heart failure: Secondary | ICD-10-CM | POA: Insufficient documentation

## 2018-07-19 DIAGNOSIS — G4733 Obstructive sleep apnea (adult) (pediatric): Secondary | ICD-10-CM | POA: Insufficient documentation

## 2018-07-19 DIAGNOSIS — Z8249 Family history of ischemic heart disease and other diseases of the circulatory system: Secondary | ICD-10-CM | POA: Insufficient documentation

## 2018-07-19 DIAGNOSIS — I2721 Secondary pulmonary arterial hypertension: Secondary | ICD-10-CM | POA: Insufficient documentation

## 2018-07-19 DIAGNOSIS — Z9981 Dependence on supplemental oxygen: Secondary | ICD-10-CM | POA: Insufficient documentation

## 2018-07-19 DIAGNOSIS — I480 Paroxysmal atrial fibrillation: Secondary | ICD-10-CM | POA: Diagnosis not present

## 2018-07-19 DIAGNOSIS — I272 Pulmonary hypertension, unspecified: Secondary | ICD-10-CM

## 2018-07-19 DIAGNOSIS — Z79899 Other long term (current) drug therapy: Secondary | ICD-10-CM | POA: Diagnosis not present

## 2018-07-19 DIAGNOSIS — Z7982 Long term (current) use of aspirin: Secondary | ICD-10-CM | POA: Diagnosis not present

## 2018-07-19 DIAGNOSIS — Z7901 Long term (current) use of anticoagulants: Secondary | ICD-10-CM | POA: Insufficient documentation

## 2018-07-19 LAB — CBC
HCT: 40.5 % (ref 39.0–52.0)
Hemoglobin: 12.3 g/dL — ABNORMAL LOW (ref 13.0–17.0)
MCH: 28.7 pg (ref 26.0–34.0)
MCHC: 30.4 g/dL (ref 30.0–36.0)
MCV: 94.4 fL (ref 80.0–100.0)
PLATELETS: 230 10*3/uL (ref 150–400)
RBC: 4.29 MIL/uL (ref 4.22–5.81)
RDW: 18.4 % — ABNORMAL HIGH (ref 11.5–15.5)
WBC: 6.2 10*3/uL (ref 4.0–10.5)
nRBC: 0 % (ref 0.0–0.2)

## 2018-07-19 LAB — BASIC METABOLIC PANEL
Anion gap: 9 (ref 5–15)
BUN: 39 mg/dL — ABNORMAL HIGH (ref 8–23)
CO2: 29 mmol/L (ref 22–32)
Calcium: 9.1 mg/dL (ref 8.9–10.3)
Chloride: 101 mmol/L (ref 98–111)
Creatinine, Ser: 2.49 mg/dL — ABNORMAL HIGH (ref 0.61–1.24)
GFR calc Af Amer: 31 mL/min — ABNORMAL LOW (ref >60)
GFR calc non Af Amer: 27 mL/min — ABNORMAL LOW (ref >60)
Glucose, Bld: 147 mg/dL — ABNORMAL HIGH (ref 70–99)
Potassium: 3.9 mmol/L (ref 3.5–5.1)
Sodium: 139 mmol/L (ref 135–145)

## 2018-07-19 LAB — BRAIN NATRIURETIC PEPTIDE: B Natriuretic Peptide: 1608.5 pg/mL — ABNORMAL HIGH (ref 0.0–100.0)

## 2018-07-19 MED ORDER — TORSEMIDE 20 MG PO TABS: 40.0000 mg | ORAL_TABLET | Freq: Two times a day (BID) | ORAL | 3 refills | Status: DC

## 2018-07-19 MED ORDER — POTASSIUM CHLORIDE CRYS ER 20 MEQ PO TBCR: 20.0000 meq | EXTENDED_RELEASE_TABLET | Freq: Every day | ORAL | 3 refills | Status: DC

## 2018-07-19 NOTE — Progress Notes (Addendum)
Advanced Heart Failure Clinic Note PCP: Primary Cardiologist: Dr. [facility]   HPI: [name] is a 63 y.o. male with history of longstanding cardiomyopathy out of proportion to CAD/chronic systolic CHF (EF 96-28% 07/6627), prior VT, s/p St Jude ICD, CAD, CVA, OSA on CPAP, pulmonary HTN on O2 at home, PAF on coumadin, HTN, and CKD stage 3.  Admitted from Dr Hochrein's office 10/01/17 with volume overload. HF team was consulted. Underwent R/LHC 10/04/17 - full report below. PYP scan and myeloma panel completed and were not suggestive of cardiac amyloidosis. V/Q scan completed for elevated PA pressures on cath, which was negative. He diuresed 31 lbs with IV lasix and metolazone and transitioned to torsemide 60 mg daily. HF meds were optimized. No ACE/ARB with CKD and soft BPs. Discharge weight 269 pounds.  He presents today today for regular follow up. Feeling OK overall, but has been more SOB recently. He has been taking more torsemide over the past week or so. He is now wearing O2 continuously, as directed. Denies SOB with ADLs, but + with anything more. His wife is doing the shopping right now with COVID-19 concerns. He sleeps propped up on a wedge. Weight up slightly. He states he is taking all medication, though ran out of K 3 days ago.   ICD interrogation: Thoracic impedence above threshold, Had recent volume overload but stable now. No VT. Pt activity ~ 1-2 hrs daily. Personally reviewed.   V/Q scan 10/07/17:No appreciable ventilation or perfusion defects.  PYP 10/05/17:Visual and quantitative assessment (grade 0/1, H/CLL equal 1.3) are equivocal for transthyretin amyloidosis.  LHC 10/04/17: Left Main  No significant disease.  Left Anterior Descending  Large, wrap-around LAD with 30% mid vessel stenosis.  Left Circumflex  50-60% distal LCx stenosis.  Right Coronary Artery  50% mid RCA stenosis.   [place] 10/04/17: RA mean 19 RV 72/23 PA 74/37, mean 51 PCWP mean 19  LV 111/28 AO 112/80 Oxygen saturations: PA 55% AO 95% Cardiac Output (Fick) 4.68  Cardiac Index (Fick) 1.9  PVR 6.8 WU CVP/PCWP 1 PAPi 1.95 1. Nonischemic cardiomyopathy. Coronary disease well out of proportion to degree of cardiomyopathy.  2. Elevated R>L heart filling pressures, suggestive of significant RV failure with high CVP/PCWP ratio and PAPi <2.  3. Low cardiac output.  4. Mixed pulmonary venous/pulmonary arterial hypertension, ?due to hypoxemia from OSA or OHS/OSA.   Review of systems complete and found to be negative unless listed in HPI.     SH:  Social History   Socioeconomic History  . Marital status: Married    Spouse name: Not on file  . Number of children: 2  . Years of education: Not on file  . Highest education level: Not on file  Occupational History  . Occupation: medically retired due to heart failure  Social Needs  . Financial resource strain: Not on file  . Food insecurity:    Worry: Not on file    Inability: Not on file  . Transportation needs:    Medical: Yes    Non-medical: No  Tobacco Use  . Smoking status: Former Smoker    Last attempt to quit: 05/04/1986    Years since quitting: 32.2  . Smokeless tobacco: Never Used  . Tobacco comment: Quit smoking 30 yrs ago. Smoked as teenager less than 1/2 ppd. Smoked x 4 years.  Substance and Sexual Activity  . Alcohol use: No    Comment: occasionally  . Drug use: No  . Sexual  activity: Never  Lifestyle  . Physical activity:    Days per week: 0 days    Minutes per session: 0 min  . Stress: Not at all  Relationships  . Social connections:    Talks on phone: Not on file    Gets together: Not on file    Attends religious service: Not on file    Active member of club or organization: Not on file    Attends meetings of clubs or organizations: Not on file    Relationship status: Not on file  . Intimate partner violence:    Fear of current or ex partner: Not on file    Emotionally abused: Not  on file    Physically abused: Not on file    Forced sexual activity: Not on file  Other Topics Concern  . Not on file  Social History Narrative  . Not on file    FH:  Family History  Problem Relation Age of Onset  . Coronary artery disease Mother   . Heart attack Mother   . Heart attack Maternal Uncle   . Heart attack Maternal Grandmother     Past Medical History:  Diagnosis Date  . CAD (coronary artery disease)    a. Initial nonobst by cath 2009. b. Cath 01/2013 in setting of VT storm: obstructive distal Cx disease (small and terminates in the AV groove, unlikely to cause significant ischemia or electrical instability), nonobstructive RCA disease, EF 15-20%.   . Cerebrovascular accident [facility])    a. 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 Chronic systolic CHF (congestive heart failure) ([place])    a. Likely NICM (out of proportion to CAD). b. 2009 - EF 25-30% by echo, 01/2013: 15-20% by cath. c. 11/2014 Echo: EF 35-40%, Gr1 DD, mild MR, mod TR, PASP 13mmHg.  . CKD (chronic kidney disease), stage II   . Dyslipidemia   . Gout   . HTN (hypertension)   . Hypokalemia   . ICD (implantable cardiac defibrillator) in place   . Insulin dependent diabetes mellitus ([place])   . Lipoma   . Nonischemic cardiomyopathy ([place])    a.  11/2014 Echo: EF 35-40%, Gr1 DD, mild MR, mod TR, PASP 41mmHg.  . OSA (obstructive sleep apnea)    does not wear cpap  . PAF (paroxysmal atrial fibrillation) ([place])    a. Noted 05/2008 by EKG;  b. CHA2DS2VASc = 5-6-->coumadin.  . Paroxysmal VT ([place])    a. s/p St. Jude ICD 2007. b. H/o paroxysmal VT/VF including VT storm 12/2012 admission prompting amio initiation;  c. 01/2013 ICD upgrade SJM 1411-36Q Ellipse VR single lead ICD.  [name] Pulmonary HTN ([place])    a. Mild by cath 01/2013.    Current Outpatient Medications  Medication Sig Dispense Refill  . acetaminophen (TYLENOL) 500 MG tablet Take 500-1,000 mg by mouth every 6 (six) hours as needed (for bilateral knee pain).    [name]  allopurinol (ZYLOPRIM) 300 MG tablet Take 1 tablet (300 mg total) by mouth daily. 90 tablet 1  . amiodarone (PACERONE) 200 MG tablet Take 1 tablet (200 mg total) by mouth daily. 90 tablet 1  . aspirin EC 81 MG EC tablet Take 1 tablet (81 mg total) by mouth daily. 30 tablet 0  . atorvastatin (LIPITOR) 80 MG tablet Take 1 tablet (80 mg total) by mouth at bedtime. 90 tablet 3  . carvedilol (COREG) 25 MG tablet Take 0.5 tablets (12.5 mg total) by mouth 2 (two) times daily with a meal. 90  tablet 3  . cholecalciferol (VITAMIN D) 1000 units tablet Take 1,000 Units by mouth daily.    . digoxin (LANOXIN) 0.125 MG tablet Take 1 tablet (0.125 mg total) by mouth every other day. 45 tablet 3  . GLUCOMANNAN PO Take 665 mg by mouth every other day. Patient reports taking EOD    . Insulin Glargine (LANTUS SOLOSTAR) 100 UNIT/ML Solostar Pen INJECT 10 UNITS INTO THE SKIN AT BEDTIME. 15 pen 2  . Insulin Pen Needle (B-D ULTRAFINE III SHORT PEN) 31G X 8 MM MISC USE ONCE DAILY WITH INSULIN 100 each 3  . Insulin Pen Needle (PEN NEEDLES) 31G X 5 MM MISC 100 each by Does not apply route 2 (two) times daily. 100 each 5  . isosorbide-hydrALAZINE (BIDIL) 20-37.5 MG tablet Take 1 tablet by mouth 3 (three) times daily. 270 tablet 3  . LANTUS SOLOSTAR 100 UNIT/ML Solostar Pen INJECT 10 UNITS INTO THE SKIN AT BEDTIME. 15 pen 3  . nitroGLYCERIN (NITROSTAT) 0.4 MG SL tablet PLACE 1 TABLET UNDER TONGUE EVERY 5 MINS X3 DOSES AS NEEDED FOR CHEST PAIN 25 tablet 11  . spironolactone (ALDACTONE) 25 MG tablet Take 1 tablet (25 mg total) by mouth daily. 90 tablet 3  . torsemide (DEMADEX) 20 MG tablet Take 60 mg by mouth daily. 60 mg in the am and 20 mg in the PM    . traMADol (ULTRAM) 50 MG tablet Take 1 tablet (50 mg total) by mouth every 8 (eight) hours as needed. 60 tablet 2  . Turmeric 500 MG CAPS Take 1,000 mg by mouth daily as needed (inflammation).     . warfarin (COUMADIN) 5 MG tablet Take as directed by Coumadin clinic. 90 tablet  0   No current facility-administered medications for this encounter.    Vitals:   07/19/18 1033  BP: 118/62  Pulse: 81  SpO2: (!) 88%  Weight: 121.6 kg (268 lb)     Wt Readings from Last 3 Encounters:  07/19/18 121.6 kg (268 lb)  06/28/18 119.3 kg (263 lb)  05/17/18 121.1 kg (267 lb)    PHYSICAL EXAM: General: NAD  HEENT: Normal Neck: Supple. JVP 7-8 cm. Carotids 2+ bilat; no bruits. No thyromegaly or nodule noted. Cor: PMI nondisplaced. RRR, No M/G/R noted Lungs: CTAB, normal effort.On 2L O2.  Abdomen: Soft, non-tender, non-distended, no HSM. No bruits or masses. +BS  Extremities: No cyanosis, clubbing, or rash. R and LLE no edema.  Neuro: Alert & orientedx3, cranial nerves grossly intact. moves all 4 extremities w/o difficulty. Affect pleasant   ASSESSMENT & PLAN: 1. Chronic Systolic HF due to NICM - St Jude ICD  - ECHO 07/28/2017 EF 20-25%. RV mildly dilated.  - NYHA III symptoms, confounded by chronic hypoxic respiratory failure - Volume status mildly elevated on exam, but stable on corevue.  - He has been taking torsemide 60 mg q am and 20 mg q pm. Will balance out to 40 mg BID. BMET today.  - Continue carvedilol 12.5 mg twice a day.  - Continue digoxin 0.125 mg daily. Check dig level today.  - Continue bidil 1 tab three times a day.  - Continue spiro 25 mg daily. - Reinforced fluid restriction to < 2 L daily, sodium restriction to less than 2000 mg daily, and the importance of daily weights.    2. CKD IV - Most recent Cr has been > 2. Cr 2.4 04/28/2018. - BMET today.   3. CAD - No s/s of ischemia.    -  LHC 10/2017 with 50-60% distal left circ and 50% mid RCA stenosis. No significant disease left main or LAD.  - Continue statin and bb.  - On coumadin. Denies bleeding.   4. PAF  - On coumadin, followed by coumadin clinic on [place].  - Regular on exam. Denies bleeding on coumadin.   6. Hx of VT - Has St Jude ICD. Interrogation as above.   7. Pulmonary HTN -  Continue home O2 (2L).  Encouraged compliance.   8. OSA with CPAP - Moderate compliance with CPAP. Encouraged compliance.   Shirley Friar, PA-C  10:38 AM   Greater than 50% of the 25 minute visit was spent in counseling/coordination of care regarding disease state education, salt/fluid restriction, sliding scale diuretics, and medication compliance.

## 2018-07-19 NOTE — Patient Instructions (Signed)
CHANGE Torsemide to 40 mg (2 tabs) twice daily RESTART Potassium 20 meq one tab daily  Labs today We will only contact you if something comes back abnormal or we need to make some changes. Otherwise no news is good news!  Your physician recommends that you schedule a follow-up appointment in: 3 weeks  in the Advanced Practitioners (PA/NP) Clinic    Do the following things EVERYDAY: 1) Weigh yourself in the morning before breakfast. Write it down and keep it in a log. 2) Take your medicines as prescribed 3) Eat low salt foods-Limit salt (sodium) to 2000 mg per day.  4) Stay as active as you can everyday 5) Limit all fluids for the day to less than 2 liters

## 2018-07-21 ENCOUNTER — Encounter: Payer: Self-pay | Admitting: Internal Medicine

## 2018-07-21 ENCOUNTER — Telehealth: Payer: Self-pay

## 2018-07-21 ENCOUNTER — Ambulatory Visit (INDEPENDENT_AMBULATORY_CARE_PROVIDER_SITE_OTHER): Payer: Medicare Other | Admitting: Internal Medicine

## 2018-07-21 ENCOUNTER — Other Ambulatory Visit: Payer: Self-pay

## 2018-07-21 ENCOUNTER — Other Ambulatory Visit (INDEPENDENT_AMBULATORY_CARE_PROVIDER_SITE_OTHER): Payer: Medicare Other

## 2018-07-21 VITALS — BP 126/84 | HR 84 | Ht 71.0 in | Wt 262.0 lb

## 2018-07-21 DIAGNOSIS — Z0000 Encounter for general adult medical examination without abnormal findings: Secondary | ICD-10-CM

## 2018-07-21 DIAGNOSIS — Z794 Long term (current) use of insulin: Secondary | ICD-10-CM | POA: Diagnosis not present

## 2018-07-21 DIAGNOSIS — I10 Essential (primary) hypertension: Secondary | ICD-10-CM

## 2018-07-21 DIAGNOSIS — E119 Type 2 diabetes mellitus without complications: Secondary | ICD-10-CM

## 2018-07-21 DIAGNOSIS — J9611 Chronic respiratory failure with hypoxia: Secondary | ICD-10-CM | POA: Diagnosis not present

## 2018-07-21 DIAGNOSIS — N183 Chronic kidney disease, stage 3 unspecified: Secondary | ICD-10-CM

## 2018-07-21 DIAGNOSIS — Z23 Encounter for immunization: Secondary | ICD-10-CM

## 2018-07-21 DIAGNOSIS — J9610 Chronic respiratory failure, unspecified whether with hypoxia or hypercapnia: Secondary | ICD-10-CM | POA: Insufficient documentation

## 2018-07-21 LAB — MICROALBUMIN / CREATININE URINE RATIO
CREATININE, U: 146.8 mg/dL
Microalb Creat Ratio: 20.9 mg/g (ref 0.0–30.0)
Microalb, Ur: 30.7 mg/dL — ABNORMAL HIGH (ref 0.0–1.9)

## 2018-07-21 LAB — URINALYSIS, ROUTINE W REFLEX MICROSCOPIC
Bilirubin Urine: NEGATIVE
Hgb urine dipstick: NEGATIVE
Ketones, ur: NEGATIVE
Leukocytes,Ua: NEGATIVE
Nitrite: NEGATIVE
Specific Gravity, Urine: 1.01 (ref 1.000–1.030)
TOTAL PROTEIN, URINE-UPE24: 100 — AB
Urine Glucose: NEGATIVE
Urobilinogen, UA: 1 (ref 0.0–1.0)
pH: 6 (ref 5.0–8.0)

## 2018-07-21 LAB — CBC WITH DIFFERENTIAL/PLATELET
Basophils Absolute: 0 10*3/uL (ref 0.0–0.1)
Basophils Relative: 0.3 % (ref 0.0–3.0)
Eosinophils Absolute: 0 10*3/uL (ref 0.0–0.7)
Eosinophils Relative: 0.7 % (ref 0.0–5.0)
HCT: 38.5 % — ABNORMAL LOW (ref 39.0–52.0)
Hemoglobin: 12.2 g/dL — ABNORMAL LOW (ref 13.0–17.0)
Lymphocytes Relative: 8.5 % — ABNORMAL LOW (ref 12.0–46.0)
Lymphs Abs: 0.5 10*3/uL — ABNORMAL LOW (ref 0.7–4.0)
MCHC: 31.8 g/dL (ref 30.0–36.0)
MCV: 92.9 fl (ref 78.0–100.0)
Monocytes Absolute: 0.6 10*3/uL (ref 0.1–1.0)
Monocytes Relative: 9 % (ref 3.0–12.0)
Neutro Abs: 5.1 10*3/uL (ref 1.4–7.7)
Neutrophils Relative %: 81.5 % — ABNORMAL HIGH (ref 43.0–77.0)
PLATELETS: 197 10*3/uL (ref 150.0–400.0)
RBC: 4.14 Mil/uL — ABNORMAL LOW (ref 4.22–5.81)
RDW: 19.7 % — ABNORMAL HIGH (ref 11.5–15.5)
WBC: 6.3 10*3/uL (ref 4.0–10.5)

## 2018-07-21 LAB — HEMOGLOBIN A1C: Hgb A1c MFr Bld: 6.5 % (ref 4.6–6.5)

## 2018-07-21 LAB — TSH: TSH: 1.14 u[IU]/mL (ref 0.35–4.50)

## 2018-07-21 LAB — PSA: PSA: 0.37 ng/mL (ref 0.10–4.00)

## 2018-07-21 NOTE — Assessment & Plan Note (Signed)
Near stage 4 ckd, cont same tx, for renal referral

## 2018-07-21 NOTE — Progress Notes (Signed)
Subjective:    Patient ID: [name], male    DOB: Feb 07, 1956, 63 y.o.   MRN: [id number]  HPI  Here for wellness and f/u;  Overall doing ok;  Pt denies Chest pain, worsening SOB,, wheezing, orthopnea, PND, worsening LE edema, palpitations, dizziness or syncope.  Pt denies neurological change such as new headache, facial or extremity weakness.  Pt denies polydipsia, polyuria, or low sugar symptoms. Pt states overall good compliance with treatment and medications, good tolerability, and has been trying to follow appropriate diet.  Pt denies worsening depressive symptoms, suicidal ideation or panic. No fever, night sweats, wt loss, loss of appetite, or other constitutional symptoms.  Pt states good ability with ADL's, has low fall risk, home safety reviewed and adequate, no other significant changes in hearing or vision, and only not active with exercise.  Due for Tdap Does also have significant fatigue, doe and low sats to 79% in the home from room to room and not able to take his o2 with him, asking for portable o2 concentrator in order to do his ADLs.  Feels he needs 3L with exertion   Currently has [facility] but would like change to [place].  Seen recently in CHF clinic with mild increased bnp in the setting of CKD with GFR in low 30's, and torsemide not at 2 tabs bid.  Not currently seeing pulm or renal.   Past Medical History:  Diagnosis Date  . CAD (coronary artery disease)    a. Initial nonobst by cath 2009. b. Cath 01/2013 in setting of VT storm: obstructive distal Cx disease (small and terminates in the AV groove, unlikely to cause significant ischemia or electrical instability), nonobstructive RCA disease, EF 15-20%.   . Cerebrovascular accident [facility])    a. 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 Chronic systolic CHF (congestive heart failure) ([place])    a. Likely NICM (out of proportion to CAD). b. 2009 - EF 25-30% by echo, 01/2013: 15-20% by cath. c. 11/2014 Echo: EF 35-40%, Gr1 DD, mild MR, mod TR,  PASP 23mmHg.  . CKD (chronic kidney disease), stage II   . Dyslipidemia   . Gout   . HTN (hypertension)   . Hypokalemia   . ICD (implantable cardiac defibrillator) in place   . Insulin dependent diabetes mellitus ([place])   . Lipoma   . Nonischemic cardiomyopathy ([place])    a.  11/2014 Echo: EF 35-40%, Gr1 DD, mild MR, mod TR, PASP 27mmHg.  . OSA (obstructive sleep apnea)    does not wear cpap  . PAF (paroxysmal atrial fibrillation) ([place])    a. Noted 05/2008 by EKG;  b. CHA2DS2VASc = 5-6-->coumadin.  . Paroxysmal VT ([place])    a. s/p St. Jude ICD 2007. b. H/o paroxysmal VT/VF including VT storm 12/2012 admission prompting amio initiation;  c. 01/2013 ICD upgrade SJM 1411-36Q Ellipse VR single lead ICD.  [name] Pulmonary HTN ([place])    a. Mild by cath 01/2013.   Past Surgical History:  Procedure Laterality Date  . CARDIAC CATHETERIZATION     Nonobstructive coronary disease 2009  . CARDIAC DEFIBRILLATOR PLACEMENT     ICD-St. Jude  . IMPLANTABLE CARDIOVERTER DEFIBRILLATOR (ICD) GENERATOR CHANGE N/A 01/15/2014   Procedure: ICD GENERATOR CHANGE;  Surgeon: Evans Lance, MD;  Location: [facility] C[facility] LAB;  Service: Cardiovascular;  Laterality: N/A;  . LEFT AND RIGHT HEART CATHETERIZATION WITH CORONARY ANGIOGRAM N/A 01/03/2013   Procedure: LEFT AND RIGHT HEART CATHETERIZATION WITH CORONARY ANGIOGRAM;  Surgeon: Peter M Martinique,  MD;  Location: [facility] CATH LAB;  Service: Cardiovascular;  Laterality: N/A;  . LIPOMA EXCISION    . RIGHT/LEFT HEART CATH AND CORONARY ANGIOGRAPHY N/A 10/04/2017   Procedure: RIGHT/LEFT HEART CATH AND CORONARY ANGIOGRAPHY;  Surgeon: Larey Dresser, MD;  Location: [facility] CV LAB;  Service: Cardiovascular;  Laterality: N/A;    reports that he quit smoking about 32 years ago. He has never used smokeless tobacco. He reports that he does not drink alcohol or use drugs. family history includes Coronary artery disease in his mother; Heart attack in his maternal grandmother, maternal uncle, and mother.  No Known Allergies Current Outpatient Medications on File Prior to Visit  Medication Sig Dispense Refill  . acetaminophen (TYLENOL) 500 MG tablet Take 500-1,000 mg by mouth every 6 (six) hours as needed (for bilateral knee pain).    [name] allopurinol (ZYLOPRIM) 300 MG tablet Take 1 tablet (300 mg total) by mouth daily. 90 tablet 1  . amiodarone (PACERONE) 200 MG tablet Take 1 tablet (200 mg total) by mouth daily. 90 tablet 1  . aspirin EC 81 MG EC tablet Take 1 tablet (81 mg total) by mouth daily. 30 tablet 0  . atorvastatin (LIPITOR) 80 MG tablet Take 1 tablet (80 mg total) by mouth at bedtime. 90 tablet 3  . carvedilol (COREG) 25 MG tablet Take 0.5 tablets (12.5 mg total) by mouth 2 (two) times daily with a meal. 90 tablet 3  . cholecalciferol (VITAMIN D) 1000 units tablet Take 1,000 Units by mouth daily.    . digoxin (LANOXIN) 0.125 MG tablet Take 1 tablet (0.125 mg total) by mouth every other day. 45 tablet 3  . GLUCOMANNAN PO Take 665 mg by mouth every other day. Patient reports taking EOD    . Insulin Glargine (LANTUS SOLOSTAR) 100 UNIT/ML Solostar Pen INJECT 10 UNITS INTO THE SKIN AT BEDTIME. 15 pen 2  . Insulin Pen Needle (B-D ULTRAFINE III SHORT PEN) 31G X 8 MM MISC USE ONCE DAILY WITH INSULIN 100 each 3  . Insulin Pen Needle (PEN NEEDLES) 31G X 5 MM MISC 100 each by Does not apply route 2 (two) times daily. 100 each 5  . isosorbide-hydrALAZINE (BIDIL) 20-37.5 MG tablet Take 1 tablet by mouth 3 (three) times daily. 270 tablet 3  . LANTUS SOLOSTAR 100 UNIT/ML Solostar Pen INJECT 10 UNITS INTO THE SKIN AT BEDTIME. 15 pen 3  . nitroGLYCERIN (NITROSTAT) 0.4 MG SL tablet PLACE 1 TABLET UNDER TONGUE EVERY 5 MINS X3 DOSES AS NEEDED FOR CHEST PAIN 25 tablet 11  . potassium chloride SA (K-DUR,KLOR-CON) 20 MEQ tablet Take 1 tablet (20 mEq total) by mouth daily. 90 tablet 3  . spironolactone (ALDACTONE) 25 MG tablet Take 1 tablet (25 mg total) by mouth daily. 90 tablet 3  . torsemide (DEMADEX) 20 MG  tablet Take 2 tablets (40 mg total) by mouth 2 (two) times daily. 120 tablet 3  . traMADol (ULTRAM) 50 MG tablet Take 1 tablet (50 mg total) by mouth every 8 (eight) hours as needed. 60 tablet 2  . Turmeric 500 MG CAPS Take 1,000 mg by mouth daily as needed (inflammation).     . warfarin (COUMADIN) 5 MG tablet Take as directed by Coumadin clinic. 90 tablet 0   No current facility-administered medications on file prior to visit.    Review of Systems  Constitutional: Negative for other unusual diaphoresis or sweats HENT: Negative for ear discharge or swelling Eyes: Negative for other worsening visual disturbances Respiratory: Negative for  stridor or other swelling  Gastrointestinal: Negative for worsening distension or other blood Genitourinary: Negative for retention or other urinary change Musculoskeletal: Negative for other MSK pain or swelling Skin: Negative for color change or other new lesions Neurological: Negative for worsening tremors and other numbness  Psychiatric/Behavioral: Negative for worsening agitation or other fatigue All other system neg per pt    Objective:   Physical Exam BP 126/84   Pulse 84   Ht 5\' 11"  (1.803 m)   Wt 262 lb (118.8 kg)   SpO2 92%   BMI 36.54 kg/m  VS noted, not ill appearing, wearing 2L [place] o2 Constitutional: Pt is oriented to person, place, and time. Appears well-developed and well-nourished, in no significant distress and comfortable Head: Normocephalic and atraumatic  Eyes: Conjunctivae and EOM are normal. Pupils are equal, round, and reactive to light Right Ear: External ear normal without discharge Left Ear: External ear normal without discharge Nose: Nose without discharge or deformity Mouth/Throat: Oropharynx is without other ulcerations and moist  Neck: Normal range of motion. Neck supple. No JVD present. No tracheal deviation present or significant neck LA or mass Cardiovascular: Normal rate, regular rhythm, normal heart sounds and  intact distal pulses.   Pulmonary/Chest: WOB normal and breath sounds decreased without rales or wheezing  Abdominal: Soft. Bowel sounds are normal. NT. No HSM  Musculoskeletal: Normal range of motion. Exhibits no edema Lymphadenopathy: Has no other cervical adenopathy.  Neurological: Pt is alert and oriented to person, place, and time. Pt has normal reflexes. No cranial nerve deficit. Motor grossly intact, Gait intact Skin: Skin is warm and dry. No rash noted or new ulcerations Psychiatric:  Has normal mood and affect. Behavior is normal without agitation No other exam findings Lab Results  Component Value Date   WBC 6.2 07/19/2018   HGB 12.3 (L) 07/19/2018   HCT 40.5 07/19/2018   PLT 230 07/19/2018   GLUCOSE 147 (H) 07/19/2018   CHOL 137 02/08/2018   TRIG 78.0 02/08/2018   HDL 33.00 (L) 02/08/2018   LDLDIRECT 194.6 05/09/2007   LDLCALC 88 02/08/2018   ALT 24 02/08/2018   AST 23 02/08/2018   NA 139 07/19/2018   K 3.9 07/19/2018   CL 101 07/19/2018   CREATININE 2.49 (H) 07/19/2018   BUN 39 (H) 07/19/2018   CO2 29 07/19/2018   TSH 0.860 10/01/2017   PSA 0.84 07/15/2016   INR 1.9 (A) 07/12/2018   HGBA1C 5.5 02/08/2018   MICROALBUR 4.6 (H) 07/15/2016       Assessment & Plan:

## 2018-07-21 NOTE — Telephone Encounter (Signed)
Copied from [place] [id number]. Topic: General - Inquiry >> Jul 21, 2018  1:27 PM Vernona Rieger wrote: Reason for CRM: Gilda with [employer] called and said the order they received for the oxygen needs to be corrected and put back in epic. She needs it to have " stationary concentrator added " " can tolerate conserving device " & continuous.

## 2018-07-21 NOTE — Assessment & Plan Note (Signed)
Ok for [employer] for port o2 concentrator continuous with 2L per [place] at rest, 3L with exertion, refer pulmonary as well

## 2018-07-21 NOTE — Assessment & Plan Note (Signed)

## 2018-07-21 NOTE — Assessment & Plan Note (Signed)
stable overall by history and exam, recent data reviewed with pt, and pt to continue medical treatment as before,  to f/u any worsening symptoms or concerns  

## 2018-07-21 NOTE — Telephone Encounter (Signed)
Done hardcopy to shirron 

## 2018-07-21 NOTE — Assessment & Plan Note (Signed)
stable overall by history and exam, recent data reviewed with pt, and pt to continue medical treatment as before,  to f/u any worsening symptoms or concerns, for a1c with labs 

## 2018-07-21 NOTE — Addendum Note (Signed)
Addended by: Biagio Borg on: 07/21/2018 04:53 PM   Modules accepted: Orders

## 2018-07-21 NOTE — Patient Instructions (Addendum)
You had the Tdap tetanus shot today  Your order for the portable oxygen concentrator to be sent to [employer] will be contacted regarding the referral for: Nephrology, and Pulmonary  Please continue all other medications as before, and refills have been done if requested.  Please have the pharmacy call with any other refills you may need.  Please continue your efforts at being more active, low cholesterol diet, and weight control.  You are otherwise up to date with prevention measures today.  Please keep your appointments with your specialists as you may have planned  Please go to the LAB in the Basement (turn left off the elevator) for the tests to be done today  You will be contacted by phone if any changes need to be made immediately.  Otherwise, you will receive a letter about your results with an explanation, but please check with MyChart first.  Please remember to sign up for MyChart if you have not done so, as this will be important to you in the future with finding out test results, communicating by private email, and scheduling acute appointments online when needed.  Please return in 6 months, or sooner if needed, with Lab testing done 3-5 days before

## 2018-07-22 ENCOUNTER — Telehealth: Payer: Self-pay

## 2018-07-22 NOTE — Telephone Encounter (Signed)
That was the only additional change needed.

## 2018-07-22 NOTE — Telephone Encounter (Signed)
Corrected order was ok ut needs to also say "portable oxygen delivered by [facility]"  [phone]  Copied from [place] [phone]. Topic: Quick Communication - Home Health Verbal Orders >> Jul 22, 2018  9:24 AM Carolyn Stare wrote: Caller/Agency Gilda with [facility] Number [phone]   Gilda ask for a call back about an order

## 2018-07-22 NOTE — Telephone Encounter (Signed)
Please call them back and ask if there is anything else it needs to say  I only want to do this one more time

## 2018-07-22 NOTE — Telephone Encounter (Signed)
Corrected order has been faxed

## 2018-07-25 ENCOUNTER — Telehealth: Payer: Self-pay | Admitting: Internal Medicine

## 2018-07-25 DIAGNOSIS — J9611 Chronic respiratory failure with hypoxia: Secondary | ICD-10-CM | POA: Diagnosis not present

## 2018-07-25 DIAGNOSIS — I5042 Chronic combined systolic (congestive) and diastolic (congestive) heart failure: Secondary | ICD-10-CM | POA: Diagnosis not present

## 2018-07-25 DIAGNOSIS — I270 Primary pulmonary hypertension: Secondary | ICD-10-CM | POA: Diagnosis not present

## 2018-07-25 NOTE — Telephone Encounter (Signed)
Done hardcopy to shirron 

## 2018-08-01 DIAGNOSIS — I5043 Acute on chronic combined systolic (congestive) and diastolic (congestive) heart failure: Secondary | ICD-10-CM | POA: Diagnosis not present

## 2018-08-01 DIAGNOSIS — R092 Respiratory arrest: Secondary | ICD-10-CM | POA: Diagnosis not present

## 2018-08-01 DIAGNOSIS — G4733 Obstructive sleep apnea (adult) (pediatric): Secondary | ICD-10-CM | POA: Diagnosis not present

## 2018-08-01 DIAGNOSIS — R0902 Hypoxemia: Secondary | ICD-10-CM | POA: Diagnosis not present

## 2018-08-01 DIAGNOSIS — I509 Heart failure, unspecified: Secondary | ICD-10-CM | POA: Diagnosis not present

## 2018-08-02 ENCOUNTER — Other Ambulatory Visit: Payer: Self-pay | Admitting: Internal Medicine

## 2018-08-02 NOTE — Telephone Encounter (Signed)
Pt called and left a voicemail that states that he does not need the oxygen anymore and would like this discontinued. Please advise

## 2018-08-02 NOTE — Telephone Encounter (Signed)
Please ask the pt to come for Nurse Visit to check for O2 sat on RA while walking, then after resting; otherwise I cannot do the order to stop this

## 2018-08-03 DIAGNOSIS — E1122 Type 2 diabetes mellitus with diabetic chronic kidney disease: Secondary | ICD-10-CM | POA: Diagnosis not present

## 2018-08-03 DIAGNOSIS — N183 Chronic kidney disease, stage 3 (moderate): Secondary | ICD-10-CM | POA: Diagnosis not present

## 2018-08-03 DIAGNOSIS — I129 Hypertensive chronic kidney disease with stage 1 through stage 4 chronic kidney disease, or unspecified chronic kidney disease: Secondary | ICD-10-CM | POA: Diagnosis not present

## 2018-08-03 DIAGNOSIS — I480 Paroxysmal atrial fibrillation: Secondary | ICD-10-CM | POA: Diagnosis not present

## 2018-08-03 DIAGNOSIS — I5040 Unspecified combined systolic (congestive) and diastolic (congestive) heart failure: Secondary | ICD-10-CM | POA: Diagnosis not present

## 2018-08-03 NOTE — Telephone Encounter (Signed)
Pt stated that he is getting his oxygen from another company but [facility] would need an order to pick up their oxygen equipment that they supplied the patient.   I have confirmed with [facility] that a d/c order would be needed and faxed to number below.    fax # [phone]   Dr. Jenny Reichmann please advise.

## 2018-08-03 NOTE — Telephone Encounter (Signed)
Same answer as last time, thanks

## 2018-08-03 NOTE — Telephone Encounter (Signed)
Ok, this is done, I did not realize he was getting o2 from 2 different sources

## 2018-08-03 NOTE — Telephone Encounter (Signed)
Please review. Thanks!  

## 2018-08-03 NOTE — Telephone Encounter (Signed)
Called pt, LVM to discuss.  

## 2018-08-04 ENCOUNTER — Other Ambulatory Visit: Payer: Self-pay | Admitting: Internal Medicine

## 2018-08-04 DIAGNOSIS — N183 Chronic kidney disease, stage 3 unspecified: Secondary | ICD-10-CM

## 2018-08-04 NOTE — Telephone Encounter (Signed)
Order has been faxed

## 2018-08-05 ENCOUNTER — Telehealth (HOSPITAL_COMMUNITY): Payer: Self-pay | Admitting: Adult Health

## 2018-08-05 ENCOUNTER — Telehealth: Payer: Self-pay | Admitting: Internal Medicine

## 2018-08-05 NOTE — Telephone Encounter (Signed)
New Message          Patient states he had a fall last week an he  Thinks that his heart was out of rhythm, he also states he chest was tight. Patient is asking for a call back to see if there is something wrong  With his device or heart out of rhythm

## 2018-08-05 NOTE — Telephone Encounter (Signed)
Attempted to contact PT via telephone # on file.  Left v/m advising of tele-health options.  Will continue to attempt to contact PT to change scheduled appt to one of the two tele-health visit options.

## 2018-08-09 ENCOUNTER — Ambulatory Visit (INDEPENDENT_AMBULATORY_CARE_PROVIDER_SITE_OTHER): Payer: Medicare Other | Admitting: General Practice

## 2018-08-09 ENCOUNTER — Encounter (HOSPITAL_COMMUNITY): Payer: Medicare Other

## 2018-08-09 DIAGNOSIS — Z8679 Personal history of other diseases of the circulatory system: Secondary | ICD-10-CM

## 2018-08-09 DIAGNOSIS — Z7901 Long term (current) use of anticoagulants: Secondary | ICD-10-CM | POA: Diagnosis not present

## 2018-08-09 DIAGNOSIS — I4891 Unspecified atrial fibrillation: Secondary | ICD-10-CM

## 2018-08-09 LAB — POCT INR: INR: 1.9 — AB (ref 2.0–3.0)

## 2018-08-09 NOTE — Patient Instructions (Addendum)
Pre visit review using our clinic review tool, if applicable. No additional management support is needed unless otherwise documented below in the visit note.  Take 1 1/2 tablets today (4/7) and then change dosage and take 1 tablet daily except 1 1/2 tablets on Monday and Wed and Fridays.  Re-check in 3 to 4 weeks.

## 2018-08-09 NOTE — Telephone Encounter (Signed)
Pt has f/u scheduled with HF office 08/10/2018.  No action needed by EP at this time.

## 2018-08-09 NOTE — Telephone Encounter (Signed)
Transmission reviewed. Normal device function. Single chamber ICD so no way to detect atrial arrhythmias. No VT/VF episodes. Presenting rhythm: Vs @ 87bpm.

## 2018-08-09 NOTE — Telephone Encounter (Signed)
Called and spoke w/ pt and requested that he send a manual transmission w/ his home. Pt stated that he was at the doctor office giving blood and that he would send the transmission when he gets home. If he needs help w/ the transmission pt will call the office back.

## 2018-08-09 NOTE — Telephone Encounter (Signed)
Spoke w/ pt and instructed him how to send manual transmission. Transmission received.  

## 2018-08-10 ENCOUNTER — Ambulatory Visit: Payer: Medicare Other | Admitting: Internal Medicine

## 2018-08-10 ENCOUNTER — Encounter (HOSPITAL_COMMUNITY): Payer: Self-pay

## 2018-08-10 ENCOUNTER — Ambulatory Visit (HOSPITAL_COMMUNITY)
Admission: RE | Admit: 2018-08-10 | Discharge: 2018-08-10 | Disposition: A | Discharge status: Home / Self Care | Payer: Medicare Other | Source: Ambulatory Visit | Attending: Internal Medicine | Admitting: Internal Medicine

## 2018-08-10 ENCOUNTER — Other Ambulatory Visit: Payer: Self-pay

## 2018-08-10 VITALS — HR 82 | Wt 263.0 lb

## 2018-08-10 DIAGNOSIS — I5042 Chronic combined systolic (congestive) and diastolic (congestive) heart failure: Secondary | ICD-10-CM | POA: Diagnosis not present

## 2018-08-10 DIAGNOSIS — I272 Pulmonary hypertension, unspecified: Secondary | ICD-10-CM

## 2018-08-10 DIAGNOSIS — R55 Syncope and collapse: Secondary | ICD-10-CM | POA: Diagnosis not present

## 2018-08-10 DIAGNOSIS — J9611 Chronic respiratory failure with hypoxia: Secondary | ICD-10-CM

## 2018-08-10 DIAGNOSIS — N183 Chronic kidney disease, stage 3 unspecified: Secondary | ICD-10-CM

## 2018-08-10 NOTE — Addendum Note (Signed)
Encounter addended by: Jovita Kussmaul, RN on: 08/10/2018 11:29 AM  Actions taken: Order list changed, Diagnosis association updated

## 2018-08-10 NOTE — Patient Instructions (Signed)
Follow up with Dr Aundra Dubin in 3 months with an ECHO and BMET .

## 2018-08-10 NOTE — Progress Notes (Signed)
Heart Failure TeleHealth Note  Due to national recommendations of social distancing due to [place] 19, Audio/video telehealth visit is felt to be most appropriate for this patient at this time.  See MyChart message from today for patient consent regarding telehealth for [facility].  Date:  08/10/2018   ID:  DEMAURI ADVINCULA, DOB [birth year], MRN [id number]  Location: Home  Provider location: [facility] Type of Visit: Established patient, etc  PCP:  Biagio Borg, MD  Cardiologist:  Minus Breeding, MD Primary HF: Dr Aundra Dubin   Chief Complaint:  Heart Failure   History of Present Illness: [name] is a 63 y.o. male with history oflongstandingcardiomyopathy out of proportion to CAD/chronic systolic CHF(EF 09-73% 09/3297), prior VT,s/p St Jude ICD, CAD,CVA,OSA on CPAP, pulmonary HTN on O2 at home, PAFon coumadin,HTN,and CKD stage 3.  Mihcael presents via Psychiatric nurse for a telehealth visit today. Overall feeling fine. Last week he had syncopal episode. Says he had his oxygen off for 20 minutes and passed out. He uses 3 liters oxygen normally. He now has small portable oxygen and has been using it. Remains SOB with exertion. Denies PND/Orthopnea. Using CPAP nightly. Appetite ok. No fever or chills. Weight at home 263-265  pounds. Taking all medications.    He denies symptoms worrisome for COVID 19.   V/Q scan 10/07/17:No appreciable ventilation or perfusion defects.  PYP 10/05/17:Visual and quantitative assessment (grade 0/1, H/CLL equal 1.3) are equivocal for transthyretin amyloidosis.  LHC 10/04/17: Left Main  No significant disease.  Left Anterior Descending  Large, wrap-around LAD with 30% mid vessel stenosis.  Left Circumflex  50-60% distal LCx stenosis.  Right Coronary Artery  50% mid RCA stenosis.   Terril 10/04/17: RA mean 19 RV 72/23 PA 74/37, mean 51 PCWP mean 19 LV 111/28 AO 112/80 Oxygen saturations: PA 55% AO 95% Cardiac  Output (Fick) 4.68  Cardiac Index (Fick) 1.9  PVR 6.8 WU CVP/PCWP 1 PAPi 1.95 1. Nonischemic cardiomyopathy. Coronary disease well out of proportion to degree of cardiomyopathy.  2. Elevated R>L heart filling pressures, suggestive of significant RV failure with high CVP/PCWP ratio and PAPi <2.  3. Low cardiac output.  4. Mixed pulmonary venous/pulmonary arterial hypertension, ?due to hypoxemia from OSA or OHS/OSA.  Past Medical History:  Diagnosis Date  . CAD (coronary artery disease)    a. Initial nonobst by cath 2009. b. Cath 01/2013 in setting of VT storm: obstructive distal Cx disease (small and terminates in the AV groove, unlikely to cause significant ischemia or electrical instability), nonobstructive RCA disease, EF 15-20%.   . Cerebrovascular accident [facility])    a. 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 Chronic systolic CHF (congestive heart failure) ([place])    a. Likely NICM (out of proportion to CAD). b. 2009 - EF 25-30% by echo, 01/2013: 15-20% by cath. c. 11/2014 Echo: EF 35-40%, Gr1 DD, mild MR, mod TR, PASP 23mmHg.  . CKD (chronic kidney disease), stage II   . Dyslipidemia   . Gout   . HTN (hypertension)   . Hypokalemia   . ICD (implantable cardiac defibrillator) in place   . Insulin dependent diabetes mellitus ([place])   . Lipoma   . Nonischemic cardiomyopathy ([place])    a.  11/2014 Echo: EF 35-40%, Gr1 DD, mild MR, mod TR, PASP 57mmHg.  . OSA (obstructive sleep apnea)    does not wear cpap  . PAF (paroxysmal atrial fibrillation) ([place])    a. Noted 05/2008  by EKG;  b. CHA2DS2VASc = 5-6-->coumadin.  . Paroxysmal VT ([place])    a. s/p St. Jude ICD 2007. b. H/o paroxysmal VT/VF including VT storm 12/2012 admission prompting amio initiation;  c. 01/2013 ICD upgrade SJM [id number] Ellipse VR single lead ICD.  [name] Pulmonary HTN ([name])    a. Mild by cath 01/2013.   Past Surgical History:  Procedure Laterality Date  . CARDIAC CATHETERIZATION     Nonobstructive coronary disease 2009  .  CARDIAC DEFIBRILLATOR PLACEMENT     ICD-St. Jude  . IMPLANTABLE CARDIOVERTER DEFIBRILLATOR (ICD) GENERATOR CHANGE N/A 01/15/2014   Procedure: ICD GENERATOR CHANGE;  Surgeon: Evans Lance, MD;  Location[facility] C[facility] LAB;  Service: Cardiovascular;  Laterality: N/A;  . LEFT AND RIGHT HEART CATHETERIZATION WITH CORONARY ANGIOGRAM N/A 01/03/2013   Procedure: LEFT AND RIGHT HEART CATHETERIZATION WITH CORONARY ANGIOGRAM;  Surgeon: Peter M Martinique, MD;  Location: [facility] CATH LAB;  Service: Cardiovascular;  Laterality: N/A;  . LIPOMA EXCISION    . RIGHT/LEFT HEART CATH AND CORONARY ANGIOGRAPHY N/A 10/04/2017   Procedure: RIGHT/LEFT HEART CATH AND CORONARY ANGIOGRAPHY;  Surgeon: Larey Dresser, MD;  Location: [place] CV LAB;  Service: Cardiovascular;  Laterality: N/A;     Current Outpatient Medications  Medication Sig Dispense Refill  . acetaminophen (TYLENOL) 500 MG tablet Take 500-1,000 mg by mouth every 6 (six) hours as needed (for bilateral knee pain).    [name] allopurinol (ZYLOPRIM) 300 MG tablet Take 1 tablet (300 mg total) by mouth daily. 90 tablet 1  . amiodarone (PACERONE) 200 MG tablet Take 1 tablet (200 mg total) by mouth daily. 90 tablet 1  . aspirin EC 81 MG EC tablet Take 1 tablet (81 mg total) by mouth daily. 30 tablet 0  . atorvastatin (LIPITOR) 80 MG tablet Take 1 tablet (80 mg total) by mouth at bedtime. 90 tablet 3  . carvedilol (COREG) 25 MG tablet Take 0.5 tablets (12.5 mg total) by mouth 2 (two) times daily with a meal. 90 tablet 3  . cholecalciferol (VITAMIN D) 1000 units tablet Take 1,000 Units by mouth daily.    . digoxin (LANOXIN) 0.125 MG tablet Take 1 tablet (0.125 mg total) by mouth every other day. 45 tablet 3  . GLUCOMANNAN PO Take 665 mg by mouth every other day. Patient reports taking EOD    . Insulin Glargine (LANTUS SOLOSTAR) 100 UNIT/ML Solostar Pen INJECT 10 UNITS INTO THE SKIN AT BEDTIME. 15 pen 2  . Insulin Pen Needle (B-D ULTRAFINE III SHORT PEN) 31G X 8 MM MISC USE ONCE  DAILY WITH INSULIN 100 each 3  . Insulin Pen Needle (PEN NEEDLES) 31G X 5 MM MISC 100 each by Does not apply route 2 (two) times daily. 100 each 5  . isosorbide-hydrALAZINE (BIDIL) 20-37.5 MG tablet Take 1 tablet by mouth 3 (three) times daily. 270 tablet 3  . LANTUS SOLOSTAR 100 UNIT/ML Solostar Pen INJECT 10 UNITS INTO THE SKIN AT BEDTIME. 15 pen 3  . nitroGLYCERIN (NITROSTAT) 0.4 MG SL tablet PLACE 1 TABLET UNDER TONGUE EVERY 5 MINS X3 DOSES AS NEEDED FOR CHEST PAIN 25 tablet 11  . potassium chloride SA (K-DUR,KLOR-CON) 20 MEQ tablet Take 1 tablet (20 mEq total) by mouth daily. 90 tablet 3  . spironolactone (ALDACTONE) 25 MG tablet Take 1 tablet (25 mg total) by mouth daily. 90 tablet 3  . torsemide (DEMADEX) 20 MG tablet Take 2 tablets (40 mg total) by mouth 2 (two) times daily. 120 tablet 3  .  Turmeric 500 MG CAPS Take 1,000 mg by mouth daily as needed (inflammation).     . warfarin (COUMADIN) 5 MG tablet Take as directed by Coumadin clinic. 90 tablet 0  . traMADol (ULTRAM) 50 MG tablet Take 1 tablet (50 mg total) by mouth every 8 (eight) hours as needed. (Patient not taking: Reported on 08/10/2018) 60 tablet 2   No current facility-administered medications for this encounter.     Allergies:   Patient has no known allergies.   Social History:  The patient  reports that he quit smoking about 32 years ago. He has never used smokeless tobacco. He reports that he does not drink alcohol or use drugs.   Family History:  The patient's family history includes Coronary artery disease in his mother; Heart attack in his maternal grandmother, maternal uncle, and mother.   ROS:  Please see the history of present illness.   All other systems are personally reviewed and negative.   Exam:  Tele Health Call; Exam is subjective  General:  Speaks in full sentences. No resp difficulty. Lungs: Normal respiratory effort with conversation. On 3 liters oxygen Abdomen: Non-distended per patient report  Extremities: Pt denies edema. Neuro: Alert & oriented x 3.   Recent Labs: 10/06/2017: Magnesium 2.1 02/08/2018: ALT 24 07/19/2018: B Natriuretic Peptide 1,608.5; BUN 39; Creatinine, Ser 2.49; Potassium 3.9; Sodium 139 07/21/2018: Hemoglobin 12.2; Platelets 197.0; TSH 1.14  Personally reviewed   Wt Readings from Last 3 Encounters:  08/10/18 119.3 kg (263 lb)  07/21/18 118.8 kg (262 lb)  07/19/18 121.6 kg (268 lb)      ASSESSMENT AND PLAN:  1.  Chronic Systolic Heart Failure R.L St Jude ICD  - ECHO 07/28/2017 EF 20-25%. RV mildly dilated.  NYHA IIIB. Volume status stable.  Continue currrent HF medications.   2. CAD  LHC 2019 with 50-60% distal left circ and 50% mid RCA.  No s/s ischemia  Continue statin, bb, asa.   3. CKD Stage IV He has been seen by  Nephrology.    4. Chronic Hypoxic Respiratory Failure On 3 liters oxygen. Needs to keep oxygen on at all times. O2 sats , 88% during visit.   5.. Pulmonary HTN RHC 2019 PVR 6.9.. Mixed pulmonary/venous HTN on cath .  Had syncopal episode last week that sounds like it was due to hypoxia off oxygen. ICD interrogation was negative for VT/VF Stressed importance of continuous oxygen and to try and maintain O2 sats > 88%. Will need to repeat ECHO next visit. May need another RHC>    COVID screen The patient does not have any symptoms that suggest any further testing/ screening at this time.  Social distancing reinforced today.  Recommended follow-up: Follow up with Dr Aundra Dubin in 3 months with an ECHO.   Relevant cardiac medications were reviewed at length with the patient today.   The patient does not have concerns regarding their medications at this time.   The following changes were made today:  As above  Today, I have spent 31 minutes with the patient with telehealth technology discussing the above issues .    Jeanmarie Hubert, NP  08/10/2018 11:12 AM  [address] [place] [address] [place] [address]  [place] [address] [phone] (office) [phone] (fax)

## 2018-08-15 ENCOUNTER — Other Ambulatory Visit: Payer: Self-pay | Admitting: Internal Medicine

## 2018-08-15 NOTE — Telephone Encounter (Signed)
Requested medication (s) are due for refill today: Yes  Requested medication (s) are on the active medication list: Yes  Last refill:  01/25/18  Future visit scheduled: No  Notes to clinic: Unable to refill, last refilled by another provider     Requested Prescriptions  Pending Prescriptions Disp Refills   atorvastatin (LIPITOR) 80 MG tablet 90 tablet 3    Sig: Take 1 tablet (80 mg total) by mouth at bedtime.     Cardiovascular:  Antilipid - Statins Failed - 08/15/2018  3:49 PM      Failed - HDL in normal range and within 360 days    HDL  Date Value Ref Range Status  02/08/2018 33.00 (L) >39.00 mg/dL Final         Passed - Total Cholesterol in normal range and within 360 days    Cholesterol  Date Value Ref Range Status  02/08/2018 137 0 - 200 mg/dL Final    Comment:    ATP III Classification       Desirable:  < 200 mg/dL               Borderline High:  200 - 239 mg/dL          High:  > = 240 mg/dL         Passed - LDL in normal range and within 360 days    LDL Cholesterol  Date Value Ref Range Status  02/08/2018 88 0 - 99 mg/dL Final         Passed - Triglycerides in normal range and within 360 days    Triglycerides  Date Value Ref Range Status  02/08/2018 78.0 0.0 - 149.0 mg/dL Final    Comment:    Normal:  <150 mg/dLBorderline High:  150 - 199 mg/dL         Passed - Patient is not pregnant      Passed - Valid encounter within last 12 months    Recent Outpatient Visits          3 weeks ago Wellness examination   [place], James W, MD   4 months ago Primary osteoarthritis of both [place], [place], DO   5 months ago Primary osteoarthritis of both [place], [place], DO   6 months ago Type 2 diabetes mellitus without complication, with long-term current use of insulin [facility]   [facility] -Georges Mouse, MD   1  year ago Wellness examination   [facility] -Georges Mouse, MD      Future Appointments            In 5 months Jenny Reichmann, Hunt Oris, MD [place], [facility]

## 2018-08-15 NOTE — Telephone Encounter (Signed)
Patient left voicemail message to request refill of Atorvastatin.

## 2018-08-16 MED ORDER — ATORVASTATIN CALCIUM 80 MG PO TABS: 80.0000 mg | ORAL_TABLET | Freq: Every day | ORAL | 1 refills | Status: DC

## 2018-08-25 DIAGNOSIS — I270 Primary pulmonary hypertension: Secondary | ICD-10-CM | POA: Diagnosis not present

## 2018-08-25 DIAGNOSIS — J9611 Chronic respiratory failure with hypoxia: Secondary | ICD-10-CM | POA: Diagnosis not present

## 2018-08-25 DIAGNOSIS — I5042 Chronic combined systolic (congestive) and diastolic (congestive) heart failure: Secondary | ICD-10-CM | POA: Diagnosis not present

## 2018-09-01 DIAGNOSIS — I509 Heart failure, unspecified: Secondary | ICD-10-CM | POA: Diagnosis not present

## 2018-09-01 DIAGNOSIS — G4733 Obstructive sleep apnea (adult) (pediatric): Secondary | ICD-10-CM | POA: Diagnosis not present

## 2018-09-01 DIAGNOSIS — R0902 Hypoxemia: Secondary | ICD-10-CM | POA: Diagnosis not present

## 2018-09-01 DIAGNOSIS — I5043 Acute on chronic combined systolic (congestive) and diastolic (congestive) heart failure: Secondary | ICD-10-CM | POA: Diagnosis not present

## 2018-09-06 ENCOUNTER — Ambulatory Visit (INDEPENDENT_AMBULATORY_CARE_PROVIDER_SITE_OTHER): Payer: Medicare Other | Admitting: General Practice

## 2018-09-06 ENCOUNTER — Ambulatory Visit: Payer: Medicare Other

## 2018-09-06 DIAGNOSIS — Z7901 Long term (current) use of anticoagulants: Secondary | ICD-10-CM

## 2018-09-06 DIAGNOSIS — Z8679 Personal history of other diseases of the circulatory system: Secondary | ICD-10-CM

## 2018-09-06 DIAGNOSIS — I4891 Unspecified atrial fibrillation: Secondary | ICD-10-CM

## 2018-09-06 LAB — POCT INR: INR: 2.1 (ref 2.0–3.0)

## 2018-09-06 NOTE — Patient Instructions (Addendum)
Pre visit review using our clinic review tool, if applicable. No additional management support is needed unless otherwise documented below in the visit note.  Continue to take 1 tablet daily except 1 1/2 tablets on Monday and Wed and Fridays.  Re-check in 6 weeks.

## 2018-09-07 ENCOUNTER — Other Ambulatory Visit: Payer: Self-pay | Admitting: Internal Medicine

## 2018-09-13 ENCOUNTER — Ambulatory Visit (INDEPENDENT_AMBULATORY_CARE_PROVIDER_SITE_OTHER): Payer: Medicare Other | Admitting: *Deleted

## 2018-09-13 ENCOUNTER — Other Ambulatory Visit: Payer: Self-pay

## 2018-09-13 DIAGNOSIS — I5042 Chronic combined systolic (congestive) and diastolic (congestive) heart failure: Secondary | ICD-10-CM | POA: Diagnosis not present

## 2018-09-13 DIAGNOSIS — R55 Syncope and collapse: Secondary | ICD-10-CM

## 2018-09-14 LAB — CUP PACEART REMOTE DEVICE CHECK
Date Time Interrogation Session: 20200513113601
Implantable Lead Implant Date: 20070817
Implantable Lead Location: 753860
Implantable Lead Model: 7001
Implantable Pulse Generator Implant Date: 20150914
Pulse Gen Serial Number: 7214340

## 2018-09-21 ENCOUNTER — Other Ambulatory Visit: Payer: Medicare Other

## 2018-09-24 ENCOUNTER — Other Ambulatory Visit: Payer: Self-pay | Admitting: Internal Medicine

## 2018-09-24 DIAGNOSIS — J9611 Chronic respiratory failure with hypoxia: Secondary | ICD-10-CM | POA: Diagnosis not present

## 2018-09-24 DIAGNOSIS — I270 Primary pulmonary hypertension: Secondary | ICD-10-CM | POA: Diagnosis not present

## 2018-09-24 DIAGNOSIS — I5042 Chronic combined systolic (congestive) and diastolic (congestive) heart failure: Secondary | ICD-10-CM | POA: Diagnosis not present

## 2018-09-27 ENCOUNTER — Other Ambulatory Visit (HOSPITAL_COMMUNITY): Payer: Self-pay

## 2018-09-27 MED ORDER — CARVEDILOL 25 MG PO TABS: 12.5000 mg | ORAL_TABLET | Freq: Two times a day (BID) | ORAL | 3 refills | Status: DC

## 2018-09-30 NOTE — Progress Notes (Signed)
Remote ICD transmission.   

## 2018-10-01 DIAGNOSIS — I509 Heart failure, unspecified: Secondary | ICD-10-CM | POA: Diagnosis not present

## 2018-10-01 DIAGNOSIS — G4733 Obstructive sleep apnea (adult) (pediatric): Secondary | ICD-10-CM | POA: Diagnosis not present

## 2018-10-01 DIAGNOSIS — R0902 Hypoxemia: Secondary | ICD-10-CM | POA: Diagnosis not present

## 2018-10-01 DIAGNOSIS — I5043 Acute on chronic combined systolic (congestive) and diastolic (congestive) heart failure: Secondary | ICD-10-CM | POA: Diagnosis not present

## 2018-10-04 ENCOUNTER — Ambulatory Visit
Admission: RE | Admit: 2018-10-04 | Discharge: 2018-10-04 | Disposition: A | Discharge status: Home / Self Care | Payer: Medicare Other | Source: Ambulatory Visit | Attending: Internal Medicine | Admitting: Internal Medicine

## 2018-10-04 DIAGNOSIS — N183 Chronic kidney disease, stage 3 unspecified: Secondary | ICD-10-CM

## 2018-10-04 DIAGNOSIS — I129 Hypertensive chronic kidney disease with stage 1 through stage 4 chronic kidney disease, or unspecified chronic kidney disease: Secondary | ICD-10-CM | POA: Diagnosis not present

## 2018-10-04 DIAGNOSIS — E1122 Type 2 diabetes mellitus with diabetic chronic kidney disease: Secondary | ICD-10-CM | POA: Diagnosis not present

## 2018-10-14 ENCOUNTER — Telehealth: Payer: Self-pay | Admitting: Internal Medicine

## 2018-10-14 DIAGNOSIS — M7989 Other specified soft tissue disorders: Secondary | ICD-10-CM

## 2018-10-14 NOTE — Telephone Encounter (Signed)
I was speaking with patient in regard to his coum appt. Patient informed me that his legs and ankles were swelling.  He was laying in bed trying to get the fluid down. He currently takes 4 fluid pills a day.  Thought I would send message back to see if there was anything else he could do.

## 2018-10-14 NOTE — Telephone Encounter (Signed)
Please ask pt to contact cardiology as they are treating him for CHF, or go to ED if pain is severe or there is worsening sob

## 2018-10-18 ENCOUNTER — Other Ambulatory Visit: Payer: Self-pay

## 2018-10-18 ENCOUNTER — Ambulatory Visit (INDEPENDENT_AMBULATORY_CARE_PROVIDER_SITE_OTHER): Payer: Medicare Other | Admitting: General Practice

## 2018-10-18 DIAGNOSIS — Z8679 Personal history of other diseases of the circulatory system: Secondary | ICD-10-CM

## 2018-10-18 DIAGNOSIS — Z7901 Long term (current) use of anticoagulants: Secondary | ICD-10-CM

## 2018-10-18 DIAGNOSIS — I4891 Unspecified atrial fibrillation: Secondary | ICD-10-CM

## 2018-10-18 LAB — POCT INR: INR: 2.3 (ref 2.0–3.0)

## 2018-10-18 NOTE — Telephone Encounter (Signed)
Mailed to pt

## 2018-10-18 NOTE — Addendum Note (Signed)
Addended by: Biagio Borg on: 10/18/2018 12:50 PM   Modules accepted: Orders

## 2018-10-18 NOTE — Patient Instructions (Addendum)
Pre visit review using our clinic review tool, if applicable. No additional management support is needed unless otherwise documented below in the visit note.  Continue to take 1 tablet daily except 1 1/2 tablets on Monday and Wed and Fridays.  Re-check in 6 weeks.

## 2018-10-18 NOTE — Telephone Encounter (Signed)
Did a follow up call with the pt and I apologized for they delay in response and informed him that I was out of the office on 10/17/18. He stated that the swelling has went down. Pt stated that leg elevation helps a lot but he can not increase his fluid pills anymore due to them interfering with his kidneys. He stated that his compression sockings help with the swelling as well and would like a new prescription to take to a medical supply store to get some new ones. I informed the pt that I would let PCP know that he is in needed of new stockings and will call back with an update.

## 2018-10-18 NOTE — Addendum Note (Signed)
Addended by: Biagio Borg on: 10/18/2018 01:12 PM   Modules accepted: Orders

## 2018-10-18 NOTE — Telephone Encounter (Signed)
Done again

## 2018-10-18 NOTE — Telephone Encounter (Signed)
Done hardcopy to shirron 

## 2018-10-18 NOTE — Telephone Encounter (Signed)
Did not have on my desk. Please advise.

## 2018-10-25 DIAGNOSIS — J9611 Chronic respiratory failure with hypoxia: Secondary | ICD-10-CM | POA: Diagnosis not present

## 2018-10-25 DIAGNOSIS — I5042 Chronic combined systolic (congestive) and diastolic (congestive) heart failure: Secondary | ICD-10-CM | POA: Diagnosis not present

## 2018-10-25 DIAGNOSIS — I270 Primary pulmonary hypertension: Secondary | ICD-10-CM | POA: Diagnosis not present

## 2018-10-29 ENCOUNTER — Other Ambulatory Visit (HOSPITAL_COMMUNITY): Payer: Self-pay | Admitting: Adult Health

## 2018-11-01 ENCOUNTER — Encounter: Payer: Self-pay | Admitting: Pulmonary Disease

## 2018-11-01 ENCOUNTER — Ambulatory Visit (INDEPENDENT_AMBULATORY_CARE_PROVIDER_SITE_OTHER): Payer: Medicare Other

## 2018-11-01 ENCOUNTER — Ambulatory Visit (INDEPENDENT_AMBULATORY_CARE_PROVIDER_SITE_OTHER): Payer: Medicare Other | Admitting: Pulmonary Disease

## 2018-11-01 ENCOUNTER — Other Ambulatory Visit: Payer: Self-pay

## 2018-11-01 VITALS — BP 134/68 | HR 68 | Ht 71.0 in | Wt 266.6 lb

## 2018-11-01 DIAGNOSIS — G4730 Sleep apnea, unspecified: Secondary | ICD-10-CM | POA: Diagnosis not present

## 2018-11-01 DIAGNOSIS — R0989 Other specified symptoms and signs involving the circulatory and respiratory systems: Secondary | ICD-10-CM | POA: Diagnosis not present

## 2018-11-01 DIAGNOSIS — R0609 Other forms of dyspnea: Secondary | ICD-10-CM

## 2018-11-01 DIAGNOSIS — I509 Heart failure, unspecified: Secondary | ICD-10-CM | POA: Diagnosis not present

## 2018-11-01 DIAGNOSIS — I5043 Acute on chronic combined systolic (congestive) and diastolic (congestive) heart failure: Secondary | ICD-10-CM | POA: Diagnosis not present

## 2018-11-01 DIAGNOSIS — R0902 Hypoxemia: Secondary | ICD-10-CM | POA: Diagnosis not present

## 2018-11-01 DIAGNOSIS — G4733 Obstructive sleep apnea (adult) (pediatric): Secondary | ICD-10-CM | POA: Diagnosis not present

## 2018-11-01 LAB — COMPREHENSIVE METABOLIC PANEL
ALT: 42 U/L (ref 0–53)
AST: 45 U/L — ABNORMAL HIGH (ref 0–37)
Albumin: 3.9 g/dL (ref 3.5–5.2)
Alkaline Phosphatase: 146 U/L — ABNORMAL HIGH (ref 39–117)
BUN: 41 mg/dL — ABNORMAL HIGH (ref 6–23)
CO2: 31 mEq/L (ref 19–32)
Calcium: 9.4 mg/dL (ref 8.4–10.5)
Chloride: 101 mEq/L (ref 96–112)
Creatinine, Ser: 2.69 mg/dL — ABNORMAL HIGH (ref 0.40–1.50)
GFR: 29.19 mL/min — ABNORMAL LOW (ref >60.00)
Glucose, Bld: 118 mg/dL — ABNORMAL HIGH (ref 70–99)
Potassium: 4.1 mEq/L (ref 3.5–5.1)
Sodium: 141 mEq/L (ref 135–145)
Total Bilirubin: 2.4 mg/dL — ABNORMAL HIGH (ref 0.2–1.2)
Total Protein: 7.1 g/dL (ref 6.0–8.3)

## 2018-11-01 LAB — CBC
HCT: 41.6 % (ref 39.0–52.0)
Hemoglobin: 13.3 g/dL (ref 13.0–17.0)
MCHC: 32 g/dL (ref 30.0–36.0)
MCV: 97.8 fl (ref 78.0–100.0)
Platelets: 161 10*3/uL (ref 150.0–400.0)
RBC: 4.26 Mil/uL (ref 4.22–5.81)
RDW: 18.2 % — ABNORMAL HIGH (ref 11.5–15.5)
WBC: 6.1 10*3/uL (ref 4.0–10.5)

## 2018-11-01 LAB — BRAIN NATRIURETIC PEPTIDE: Pro B Natriuretic peptide (BNP): 2158 pg/mL — ABNORMAL HIGH (ref 0.0–100.0)

## 2018-11-01 NOTE — Progress Notes (Signed)
[facility]  Chief Complaint  Patient presents with  . Consult    referred by Dr Jenny Reichmann, for O2 sats dropping in 80's, uses POC and O2 concentrator 2 liters, DME [employer]    Constitutional:  BP 134/68 (BP Location: Left Arm, Cuff Size: Normal)   Pulse 68   Ht 5\' 11"  (1.803 m)   Wt 266 lb 9.6 oz (120.9 kg)   SpO2 92%   BMI 37.18 kg/m   Past Medical History:  Pneumonia with septic shock 2016, PAF, CAD, HTN, HLD, CVA, combined CHF, pulmonary hypertension, CKD, DM, Gout, VT s/p ICD  Brief Summary:  [name] is a 63 y.o. male with chronic respiratory failure.  He was previously seen by Dr. Gwenette Greet for sleep apnea.  He was in hospital in July 2016 with septic shock from pneumonia.  He was noted to have hypoxic and hypercapnic respiratory failure.  He was started on supplemental oxygen then.  He remains on 1 to 2 liters oxygen.  He has CPAP, but this is very old and he isn't sure if it still works.  He has remote history of smoking.  Never been told he has asthma or COPD.  Doesn't recall having PFT before.  Denies cough, wheeze, or sputum.  He does get leg swelling.   Physical Exam:   Appearance - wearing oxygen  ENMT - clear nasal mucosa, midline nasal  septum, no oral exudates, no LAN, trachea midline  Respiratory - normal chest wall, normal respiratory effort, no accessory muscle use, no wheeze/rales  CV - s1s2 regular rate and rhythm, no murmurs, 1+ lower leg edema, radial pulses symmetric  GI - soft, non tender, no masses  Lymph - no adenopathy noted in neck and axillary areas  MSK - normal gait  Ext - no cyanosis, clubbing, or joint inflammation noted  Skin - no rashes, lesions, or ulcers  Neuro - normal strength, oriented x 3  Psych - normal mood and affect  CMP Latest Ref Rng & Units 07/19/2018 05/17/2018 04/28/2018  Glucose 70 - 99 mg/dL 147(H) 120(H) 106(H)  BUN 8 - 23 mg/dL 39(H) 43(H) 39(H)  Creatinine 0.61 - 1.24  mg/dL 2.49(H) 2.36(H) 2.43(H)  Sodium 135 - 145 mmol/L 139 141 141  Potassium 3.5 - 5.1 mmol/L 3.9 4.4 4.5  Chloride 98 - 111 mmol/L 101 104 108  CO2 22 - 32 mmol/L 29 27 25   Calcium 8.9 - 10.3 mg/dL 9.1 8.8(L) 8.8(L)  Total Protein 6.0 - 8.3 g/dL - - -  Total Bilirubin 0.2 - 1.2 mg/dL - - -  Alkaline Phos 39 - 117 U/L - - -  AST 0 - 37 U/L - - -  ALT 0 - 53 U/L - - -   CBC Latest Ref Rng & Units 07/21/2018 07/19/2018 10/07/2017  WBC 4.0 - 10.5 K/uL 6.3 6.2 6.0  Hemoglobin 13.0 - 17.0 g/dL 12.2(L) 12.3(L) 13.7  Hematocrit 39.0 - 52.0 % 38.5(L) 40.5 45.1  Platelets 150.0 - 400.0 K/uL 197.0 230 202    Lab Results  Component Value Date   TSH 1.14 07/21/2018    Discussion:  He has chronic respiratory failure in the setting of sleep disordered breathing, congestive heart failure and pulmonary hypertension.  Assessment/Plan:   Chronic respiratory failure. - continue 2 liters supplemental oxygen - repeat CXR today - check CMET, CBC, BNP today - schedule pulmonary function test  Sleep disordered breathing. - concerned he has sleep apnea and hypoventilation - his  current CPAP equipment is very old and uncertain if it is still working - will arrange for in lab sleep study to assess current status of sleep disordered breathing  Chronic combined CHF with hx of VT, CAD, PAF, HTN. 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 - f/u with nephrology   Patient Instructions  Chest xray and labs today  Will schedule pulmonary function test and in lab sleep study  Follow up in 3 weeks    Chesley Mires, MD [place] Pager: [phone] 11/01/2018, 12:01 PM  Flow Sheet     Pulmonary tests:  V/Q 10/07/17 >> very low probability for PE  Sleep tests:  PSG 07/29/04 >> AHI 8, SpO2 low 90%  Cardiac tests:  Echo 07/28/17 >> EF 20 to 25%, mod LVH, ascending aorta 43 mmHg,  PAS 40 mmHg RHC 10/04/17 >> RA mean 19, RV 72/23, PA 74/37 mean 51, PCWP 19, CI 1.9, PVR 6.8 WU  Review of Systems:  Constitutional: Negative for fever and unexpected weight change.  HENT: Negative for congestion, dental problem, ear pain, nosebleeds, postnasal drip, rhinorrhea, sinus pressure, sneezing, sore throat and trouble swallowing.   Eyes: Positive for itching. Negative for redness.  Respiratory: Positive for shortness of breath. Negative for cough, chest tightness and wheezing.   Cardiovascular: Positive for palpitations and leg swelling.  Gastrointestinal: Negative for nausea and vomiting.  Genitourinary: Negative for dysuria.  Musculoskeletal: Negative for joint swelling.  Skin: Negative for rash.  Allergic/Immunologic: Negative.  Negative for environmental allergies, food allergies and immunocompromised state.  Neurological: Negative for headaches.  Hematological: Does not bruise/bleed easily.  Psychiatric/Behavioral: Negative for dysphoric mood. The patient is not nervous/anxious.    Medications:   Allergies as of 11/01/2018   No Known Allergies     Medication List       Accurate as of November 01, 2018 12:01 PM. If you have any questions, ask your nurse or doctor.        acetaminophen 500 MG tablet Commonly known as: TYLENOL Take 500-1,000 mg by mouth every 6 (six) hours as needed (for bilateral knee pain).   allopurinol 300 MG tablet Commonly known as: ZYLOPRIM Take 1 tablet (300 mg total) by mouth daily.   amiodarone 200 MG tablet Commonly known as: PACERONE Take 1 tablet (200 mg total) by mouth daily.   aspirin 81 MG EC tablet Take 1 tablet (81 mg total) by mouth daily.   atorvastatin 80 MG tablet Commonly known as: LIPITOR Take 1 tablet (80 mg total) by mouth at bedtime.   carvedilol 25 MG tablet Commonly known as: COREG Take 0.5 tablets (12.5 mg total) by mouth 2 (two) times daily with a meal.   cholecalciferol 1000 units tablet Commonly known as:  VITAMIN D Take 1,000 Units by mouth daily.   digoxin 0.125 MG tablet Commonly known as: LANOXIN Take 1 tablet (0.125 mg total) by mouth every other day.   GLUCOMANNAN PO Take 665 mg by mouth every other day. Patient reports taking EOD   Insulin Glargine 100 UNIT/ML Solostar Pen Commonly known as: Lantus SoloStar INJECT 10 UNITS INTO THE SKIN AT BEDTIME.   Lantus SoloStar 100 UNIT/ML Solostar Pen Generic drug: Insulin Glargine INJECT 10 UNITS INTO THE SKIN AT BEDTIME.   isosorbide-hydrALAZINE 20-37.5 MG tablet Commonly known as: BIDIL Take 1 tablet by mouth 3 (three) times daily.   nitroGLYCERIN 0.4 MG SL tablet  Commonly known as: NITROSTAT PLACE 1 TABLET UNDER TONGUE EVERY 5 MINS X3 DOSES AS NEEDED FOR CHEST PAIN   Pen Needles 31G X 5 MM Misc 100 each by Does not apply route 2 (two) times daily.   Insulin Pen Needle 31G X 8 MM Misc Commonly known as: B-D ULTRAFINE III SHORT PEN USE ONCE DAILY WITH INSULIN   potassium chloride SA 20 MEQ tablet Commonly known as: K-DUR Take 1 tablet (20 mEq total) by mouth daily.   spironolactone 25 MG tablet Commonly known as: ALDACTONE Take 1 tablet (25 mg total) by mouth daily.   torsemide 20 MG tablet Commonly known as: DEMADEX TAKE 3 TABLETS BY MOUTH DAILY AND 1 EXTRA TAB ONCE DAILY AS NEEDED FOR WEIGHT GAIN 3LBS OR MORE IN 24HOURS   traMADol 50 MG tablet Commonly known as: ULTRAM Take 1 tablet (50 mg total) by mouth every 8 (eight) hours as needed.   Turmeric 500 MG Caps Take 1,000 mg by mouth daily as needed (inflammation).   warfarin 5 MG tablet Commonly known as: COUMADIN Take as directed by the anticoagulation clinic. If you are unsure how to take this medication, talk to your nurse or doctor. Original instructions: Take 1 tablet daily except take 1 1/2 tablets on Mon Wed and Fri or Take as directed by Coumadin clinic.       Past Surgical History:  He  has a past surgical history that includes Cardiac  catheterization; Cardiac defibrillator placement; Lipoma excision; left and right heart catheterization with coronary angiogram (N/A, 01/03/2013); implantable cardioverter defibrillator (icd) generator change (N/A, 01/15/2014); and RIGHT/LEFT HEART CATH AND CORONARY ANGIOGRAPHY (N/A, 10/04/2017).  Family History:  His family history includes Coronary artery disease in his mother; Heart attack in his maternal grandmother, maternal uncle, and mother.  Social History:  He  reports that he quit smoking about 32 years ago. He has never used smokeless tobacco. He reports that he does not drink alcohol or use drugs.

## 2018-11-01 NOTE — Progress Notes (Signed)
   Subjective:    Patient ID: [name], male    DOB: 02-25-1956, 63 y.o.   MRN: [id number]  HPI    Review of Systems  Constitutional: Negative for fever and unexpected weight change.  HENT: Negative for congestion, dental problem, ear pain, nosebleeds, postnasal drip, rhinorrhea, sinus pressure, sneezing, sore throat and trouble swallowing.   Eyes: Positive for itching. Negative for redness.  Respiratory: Positive for shortness of breath. Negative for cough, chest tightness and wheezing.   Cardiovascular: Positive for palpitations and leg swelling.  Gastrointestinal: Negative for nausea and vomiting.  Genitourinary: Negative for dysuria.  Musculoskeletal: Negative for joint swelling.  Skin: Negative for rash.  Allergic/Immunologic: Negative.  Negative for environmental allergies, food allergies and immunocompromised state.  Neurological: Negative for headaches.  Hematological: Does not bruise/bleed easily.  Psychiatric/Behavioral: Negative for dysphoric mood. The patient is not nervous/anxious.        Objective:   Physical Exam        Assessment & Plan:

## 2018-11-01 NOTE — Patient Instructions (Signed)
Chest xray and labs today  Will schedule pulmonary function test and in lab sleep study  Follow up in 3 weeks

## 2018-11-02 ENCOUNTER — Telehealth: Payer: Self-pay | Admitting: Pulmonary Disease

## 2018-11-02 ENCOUNTER — Other Ambulatory Visit (HOSPITAL_COMMUNITY): Payer: Self-pay

## 2018-11-02 MED ORDER — DIGOXIN 125 MCG PO TABS: 0.1250 mg | ORAL_TABLET | ORAL | 3 refills | Status: DC

## 2018-11-02 NOTE — Telephone Encounter (Signed)
Called and spoke with pt letting him know the results of both labwork and cxr and stated to him VS would go over in more detail at next OV. Pt verbalized understanding. Nothing further needed.

## 2018-11-02 NOTE — Telephone Encounter (Signed)
CMP Latest Ref Rng & Units 11/01/2018 07/19/2018 05/17/2018  Glucose 70 - 99 mg/dL 118(H) 147(H) 120(H)  BUN 6 - 23 mg/dL 41(H) 39(H) 43(H)  Creatinine 0.40 - 1.50 mg/dL 2.69(H) 2.49(H) 2.36(H)  Sodium 135 - 145 mEq/L 141 139 141  Potassium 3.5 - 5.1 mEq/L 4.1 3.9 4.4  Chloride 96 - 112 mEq/L 101 101 104  CO2 19 - 32 mEq/L 31 29 27   Calcium 8.4 - 10.5 mg/dL 9.4 9.1 8.8(L)  Total Protein 6.0 - 8.3 g/dL 7.1 - -  Total Bilirubin 0.2 - 1.2 mg/dL 2.4(H) - -  Alkaline Phos 39 - 117 U/L 146(H) - -  AST 0 - 37 U/L 45(H) - -  ALT 0 - 53 U/L 42 - -    CBC Latest Ref Rng & Units 11/01/2018 07/21/2018 07/19/2018  WBC 4.0 - 10.5 K/uL 6.1 6.3 6.2  Hemoglobin 13.0 - 17.0 g/dL 13.3 12.2(L) 12.3(L)  Hematocrit 39.0 - 52.0 % 41.6 38.5(L) 40.5  Platelets 150.0 - 400.0 K/uL 161.0 197.0 230    ProBNP    Component Value Date/Time   PROBNP 2,158.0 (H) 11/01/2018 1229    Dg Chest 2 View  Result Date: 11/01/2018 CLINICAL DATA:  Dyspnea on exertion EXAM: CHEST - 2 VIEW COMPARISON:  10/07/2017 FINDINGS: Cardiac shadow remains enlarged. Defibrillator is again noted. Inspiratory effort is poor. Mild vascular congestion is noted without interstitial edema. No focal infiltrate is noted. IMPRESSION: Poor inspiratory effort. Mild central vascular congestion is noted without edema. Electronically Signed   By: Inez Catalina M.D.   On: 11/01/2018 16:10    Please let him know his last showed changes from chronic kidney disease, but kidney levels are stable.  Chest xray shows expected changes from history of congestive heart failure.  No change to current treatment plan.  Will discuss in more detail at next ROV.

## 2018-11-17 ENCOUNTER — Ambulatory Visit (HOSPITAL_BASED_OUTPATIENT_CLINIC_OR_DEPARTMENT_OTHER)
Admission: RE | Admit: 2018-11-17 | Discharge: 2018-11-17 | Disposition: A | Discharge status: Home / Self Care | Payer: Medicare Other | Source: Ambulatory Visit | Attending: Internal Medicine | Admitting: Internal Medicine

## 2018-11-17 ENCOUNTER — Encounter (HOSPITAL_COMMUNITY): Payer: Self-pay | Admitting: Internal Medicine

## 2018-11-17 ENCOUNTER — Encounter (HOSPITAL_COMMUNITY): Payer: Self-pay

## 2018-11-17 ENCOUNTER — Ambulatory Visit (HOSPITAL_COMMUNITY)
Admission: RE | Admit: 2018-11-17 | Discharge: 2018-11-17 | Disposition: A | Discharge status: Home / Self Care | Payer: Medicare Other | Source: Ambulatory Visit | Attending: Internal Medicine | Admitting: Internal Medicine

## 2018-11-17 ENCOUNTER — Other Ambulatory Visit: Payer: Self-pay

## 2018-11-17 VITALS — BP 130/80 | HR 74 | Wt 258.6 lb

## 2018-11-17 DIAGNOSIS — E1122 Type 2 diabetes mellitus with diabetic chronic kidney disease: Secondary | ICD-10-CM | POA: Insufficient documentation

## 2018-11-17 DIAGNOSIS — I428 Other cardiomyopathies: Secondary | ICD-10-CM | POA: Insufficient documentation

## 2018-11-17 DIAGNOSIS — I130 Hypertensive heart and chronic kidney disease with heart failure and stage 1 through stage 4 chronic kidney disease, or unspecified chronic kidney disease: Secondary | ICD-10-CM | POA: Insufficient documentation

## 2018-11-17 DIAGNOSIS — I2510 Atherosclerotic heart disease of native coronary artery without angina pectoris: Secondary | ICD-10-CM | POA: Diagnosis not present

## 2018-11-17 DIAGNOSIS — Z87891 Personal history of nicotine dependence: Secondary | ICD-10-CM | POA: Diagnosis not present

## 2018-11-17 DIAGNOSIS — Z8249 Family history of ischemic heart disease and other diseases of the circulatory system: Secondary | ICD-10-CM | POA: Insufficient documentation

## 2018-11-17 DIAGNOSIS — I2721 Secondary pulmonary arterial hypertension: Secondary | ICD-10-CM | POA: Diagnosis not present

## 2018-11-17 DIAGNOSIS — I5042 Chronic combined systolic (congestive) and diastolic (congestive) heart failure: Secondary | ICD-10-CM

## 2018-11-17 DIAGNOSIS — I313 Pericardial effusion (noninflammatory): Secondary | ICD-10-CM | POA: Insufficient documentation

## 2018-11-17 DIAGNOSIS — N183 Chronic kidney disease, stage 3 unspecified: Secondary | ICD-10-CM

## 2018-11-17 DIAGNOSIS — Z794 Long term (current) use of insulin: Secondary | ICD-10-CM | POA: Diagnosis not present

## 2018-11-17 DIAGNOSIS — M109 Gout, unspecified: Secondary | ICD-10-CM | POA: Diagnosis not present

## 2018-11-17 DIAGNOSIS — Z7901 Long term (current) use of anticoagulants: Secondary | ICD-10-CM | POA: Diagnosis not present

## 2018-11-17 DIAGNOSIS — I480 Paroxysmal atrial fibrillation: Secondary | ICD-10-CM

## 2018-11-17 DIAGNOSIS — Z7982 Long term (current) use of aspirin: Secondary | ICD-10-CM | POA: Insufficient documentation

## 2018-11-17 DIAGNOSIS — Z95810 Presence of automatic (implantable) cardiac defibrillator: Secondary | ICD-10-CM | POA: Insufficient documentation

## 2018-11-17 DIAGNOSIS — Z79899 Other long term (current) drug therapy: Secondary | ICD-10-CM | POA: Insufficient documentation

## 2018-11-17 DIAGNOSIS — E785 Hyperlipidemia, unspecified: Secondary | ICD-10-CM | POA: Insufficient documentation

## 2018-11-17 DIAGNOSIS — Z8673 Personal history of transient ischemic attack (TIA), and cerebral infarction without residual deficits: Secondary | ICD-10-CM | POA: Diagnosis not present

## 2018-11-17 DIAGNOSIS — I272 Pulmonary hypertension, unspecified: Secondary | ICD-10-CM | POA: Diagnosis not present

## 2018-11-17 DIAGNOSIS — I5022 Chronic systolic (congestive) heart failure: Secondary | ICD-10-CM | POA: Diagnosis not present

## 2018-11-17 DIAGNOSIS — G4733 Obstructive sleep apnea (adult) (pediatric): Secondary | ICD-10-CM | POA: Diagnosis not present

## 2018-11-17 LAB — PROTIME-INR
INR: 2.3 — ABNORMAL HIGH (ref 0.8–1.2)
Prothrombin Time: 24.6 seconds — ABNORMAL HIGH (ref 11.4–15.2)

## 2018-11-17 LAB — BASIC METABOLIC PANEL
Anion gap: 12 (ref 5–15)
BUN: 44 mg/dL — ABNORMAL HIGH (ref 8–23)
CO2: 25 mmol/L (ref 22–32)
Calcium: 9.4 mg/dL (ref 8.9–10.3)
Chloride: 103 mmol/L (ref 98–111)
Creatinine, Ser: 2.45 mg/dL — ABNORMAL HIGH (ref 0.61–1.24)
GFR calc Af Amer: 32 mL/min — ABNORMAL LOW (ref >60)
GFR calc non Af Amer: 27 mL/min — ABNORMAL LOW (ref >60)
Glucose, Bld: 101 mg/dL — ABNORMAL HIGH (ref 70–99)
Potassium: 4.1 mmol/L (ref 3.5–5.1)
Sodium: 140 mmol/L (ref 135–145)

## 2018-11-17 LAB — CBC
HCT: 42.6 % (ref 39.0–52.0)
Hemoglobin: 13.4 g/dL (ref 13.0–17.0)
MCH: 30.7 pg (ref 26.0–34.0)
MCHC: 31.5 g/dL (ref 30.0–36.0)
MCV: 97.5 fL (ref 80.0–100.0)
Platelets: 171 10*3/uL (ref 150–400)
RBC: 4.37 MIL/uL (ref 4.22–5.81)
RDW: 17.2 % — ABNORMAL HIGH (ref 11.5–15.5)
WBC: 5.9 10*3/uL (ref 4.0–10.5)
nRBC: 0 % (ref 0.0–0.2)

## 2018-11-17 MED ORDER — ISOSORB DINITRATE-HYDRALAZINE 20-37.5 MG PO TABS: 2.0000 | ORAL_TABLET | Freq: Three times a day (TID) | ORAL | 12 refills | Status: DC

## 2018-11-17 NOTE — Patient Instructions (Addendum)
Labs were done today. We will call you with any ABNORMAL results. No news is good news!  INCREASE Bidil to 2 tablets, three times a day.  PLEASE go to [employer] through [id number] testing center tent on 31 July to complete a Covid test before your procedure.   You've been scheduled for a Right Heart Catheterization on 4 August @ 12:30 pm.   Your physician wants you to follow-up in: 3 MONTHS. You will receive a reminder letter in the mail two months in advance. If you don't receive a letter, please call our office to schedule the follow-up appointment.  At the [facility], you and your health needs are our priority. As part of our continuing mission to provide you with exceptional heart care, we have created designated Provider Care Teams. These Care Teams include your primary Cardiologist (physician) and Advanced Practice Providers (APPs- Physician Assistants and Nurse Practitioners) who all work together to provide you with the care you need, when you need it.   You may see any of the following providers on your designated Care Team at your next follow up: [name] Dr Glori Bickers . Dr [name] . Darrick Grinder, NP

## 2018-11-17 NOTE — Progress Notes (Signed)
Advanced Heart Failure Clinic Note PCP: Primary Cardiologist: Dr. [facility]   HPI: [name] is a 63 y.o. male with history of longstanding cardiomyopathy out of proportion to CAD/chronic systolic CHF (EF 94-49% 10/7589), prior VT, s/p St Jude ICD, CAD, CVA, OSA on CPAP, pulmonary HTN on O2 at home, PAF on coumadin, HTN, and CKD stage 3.  Admitted from Dr Hochrein's office 10/01/17 with volume overload. HF team was consulted. Underwent R/LHC 10/04/17 - full report below. PYP scan and myeloma panel completed and were not suggestive of cardiac amyloidosis. V/Q scan completed for elevated PA pressures on cath, which was negative. He diuresed 31 lbs with IV lasix and metolazone and transitioned to torsemide 60 mg daily. HF meds were optimized. No ACE/ARB with CKD and soft BPs. Discharge weight 269 pounds.  He presents today for regular follow up. Says he feels about 70%. Takes torsemide 40 in am, 20 in afternoon and 20 in evening. When he takes 40 in am says he doesn't feel like he pees that much but if he takes 40 at night pees too much so has settled on this regimen. Weight down to 254 at home. No edema, orthopnea or PND. Can do ADLs but has to go slowly. Struggling with knee pain. Wearing O2. Says Dr. Halford Chessman told him not to use his old CPAP. Has repeat sleep study next week .  ICD interrogation: No VT/VF. Thoracic impedence was low in May/June but now looks great No ICD shocks. Personally reviewed  Echo today: EF 20-25% RV dilated and severely HK with septal flattening. Personally reviewed   V/Q scan 10/07/17:No appreciable ventilation or perfusion defects.  PYP 10/05/17:Visual and quantitative assessment (grade 0/1, H/CLL equal 1.3) are equivocal for transthyretin amyloidosis.  LHC 10/04/17: Left Main  No significant disease.  Left Anterior Descending  Large, wrap-around LAD with 30% mid vessel stenosis.  Left Circumflex  50-60% distal LCx stenosis.  Right Coronary  Artery  50% mid RCA stenosis.   [place] 10/04/17: RA mean 19 RV 72/23 PA 74/37, mean 51 PCWP mean 19 LV 111/28 AO 112/80 Oxygen saturations: PA 55% AO 95% Cardiac Output (Fick) 4.68  Cardiac Index (Fick) 1.9  PVR 6.8 WU CVP/PCWP 1 PAPi 1.95 1. Nonischemic cardiomyopathy. Coronary disease well out of proportion to degree of cardiomyopathy.  2. Elevated R>L heart filling pressures, suggestive of significant RV failure with high CVP/PCWP ratio and PAPi <2.  3. Low cardiac output.  4. Mixed pulmonary venous/pulmonary arterial hypertension, ?due to hypoxemia from OSA or OHS/OSA.   Review of systems complete and found to be negative unless listed in HPI.    SH:  Social History   Socioeconomic History  . Marital status: Married    Spouse name: Not on file  . Number of children: 2  . Years of education: Not on file  . Highest education level: Not on file  Occupational History  . Occupation: medically retired due to heart failure  Social Needs  . Financial resource strain: Not on file  . Food insecurity    Worry: Not on file    Inability: Not on file  . Transportation needs    Medical: Yes    Non-medical: No  Tobacco Use  . Smoking status: Former Smoker    Quit date: 05/04/1986    Years since quitting: 32.5  . Smokeless tobacco: Never Used  . Tobacco comment: Quit smoking 30 yrs ago. Smoked as teenager less than 1/2 ppd. Smoked x 4 years.  Substance and Sexual Activity  . Alcohol use: No    Comment: occasionally  . Drug use: No  . Sexual activity: Never  Lifestyle  . Physical activity    Days per week: 0 days    Minutes per session: 0 min  . Stress: Not at all  Relationships  . Social Herbalist on phone: Not on file    Gets together: Not on file    Attends religious service: Not on file    Active member of club or organization: Not on file    Attends meetings of clubs or organizations: Not on file    Relationship status: Not on file  . Intimate  partner violence    Fear of current or ex partner: Not on file    Emotionally abused: Not on file    Physically abused: Not on file    Forced sexual activity: Not on file  Other Topics Concern  . Not on file  Social History Narrative  . Not on file    FH:  Family History  Problem Relation Age of Onset  . Coronary artery disease Mother   . Heart attack Mother   . Heart attack Maternal Uncle   . Heart attack Maternal Grandmother     Past Medical History:  Diagnosis Date  . CAD (coronary artery disease)    a. Initial nonobst by cath 2009. b. Cath 01/2013 in setting of VT storm: obstructive distal Cx disease (small and terminates in the AV groove, unlikely to cause significant ischemia or electrical instability), nonobstructive RCA disease, EF 15-20%.   . Cerebrovascular accident [facility])    a. 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 Chronic systolic CHF (congestive heart failure) ([place])    a. Likely NICM (out of proportion to CAD). b. 2009 - EF 25-30% by echo, 01/2013: 15-20% by cath. c. 11/2014 Echo: EF 35-40%, Gr1 DD, mild MR, mod TR, PASP 31mmHg.  . CKD (chronic kidney disease), stage II   . Dyslipidemia   . Gout   . HTN (hypertension)   . Hypokalemia   . ICD (implantable cardiac defibrillator) in place   . Insulin dependent diabetes mellitus ([place])   . Lipoma   . Nonischemic cardiomyopathy ([place])    a.  11/2014 Echo: EF 35-40%, Gr1 DD, mild MR, mod TR, PASP 67mmHg.  . OSA (obstructive sleep apnea)    does not wear cpap  . PAF (paroxysmal atrial fibrillation) ([place])    a. Noted 05/2008 by EKG;  b. CHA2DS2VASc = 5-6-->coumadin.  . Paroxysmal VT ([place])    a. s/p St. Jude ICD 2007. b. H/o paroxysmal VT/VF including VT storm 12/2012 admission prompting amio initiation;  c. 01/2013 ICD upgrade SJM [id number] [place] VR single lead ICD.  [name] Pulmonary HTN ([place])    a. Mild by cath 01/2013.    Current Outpatient Medications  Medication Sig Dispense Refill  . acetaminophen (TYLENOL) 500 MG tablet  Take 500-1,000 mg by mouth every 6 (six) hours as needed (for bilateral knee pain).    [name] allopurinol (ZYLOPRIM) 300 MG tablet Take 1 tablet (300 mg total) by mouth daily. 90 tablet 1  . amiodarone (PACERONE) 200 MG tablet Take 1 tablet (200 mg total) by mouth daily. 90 tablet 1  . aspirin EC 81 MG EC tablet Take 1 tablet (81 mg total) by mouth daily. 30 tablet 0  . atorvastatin (LIPITOR) 80 MG tablet Take 1 tablet (80 mg total) by mouth at bedtime. 90 tablet 1  .  carvedilol (COREG) 25 MG tablet Take 0.5 tablets (12.5 mg total) by mouth 2 (two) times daily with a meal. 90 tablet 3  . cholecalciferol (VITAMIN D) 1000 units tablet Take 1,000 Units by mouth daily.    . digoxin (LANOXIN) 0.125 MG tablet Take 1 tablet (0.125 mg total) by mouth every other day. 45 tablet 3  . GLUCOMANNAN PO Take 665 mg by mouth every other day. Patient reports taking EOD    . Insulin Glargine (LANTUS SOLOSTAR) 100 UNIT/ML Solostar Pen INJECT 10 UNITS INTO THE SKIN AT BEDTIME. 15 pen 2  . Insulin Pen Needle (B-D ULTRAFINE III SHORT PEN) 31G X 8 MM MISC USE ONCE DAILY WITH INSULIN 100 each 3  . Insulin Pen Needle (PEN NEEDLES) 31G X 5 MM MISC 100 each by Does not apply route 2 (two) times daily. 100 each 5  . isosorbide-hydrALAZINE (BIDIL) 20-37.5 MG tablet Take 1 tablet by mouth 3 (three) times daily. 270 tablet 3  . LANTUS SOLOSTAR 100 UNIT/ML Solostar Pen INJECT 10 UNITS INTO THE SKIN AT BEDTIME. 15 pen 3  . nitroGLYCERIN (NITROSTAT) 0.4 MG SL tablet PLACE 1 TABLET UNDER TONGUE EVERY 5 MINS X3 DOSES AS NEEDED FOR CHEST PAIN 25 tablet 11  . potassium chloride SA (K-DUR,KLOR-CON) 20 MEQ tablet Take 1 tablet (20 mEq total) by mouth daily. 90 tablet 3  . spironolactone (ALDACTONE) 25 MG tablet Take 1 tablet (25 mg total) by mouth daily. 90 tablet 3  . torsemide (DEMADEX) 20 MG tablet Take by mouth daily. 40 mg in the AM, 20 mg at lunch, 20 mg in the PM with add'l 20 mg prn (bloating)    . traMADol (ULTRAM) 50 MG tablet Take  1 tablet (50 mg total) by mouth every 8 (eight) hours as needed. 60 tablet 2  . Turmeric 500 MG CAPS Take 1,000 mg by mouth daily as needed (inflammation).     . warfarin (COUMADIN) 5 MG tablet Take 1 tablet daily except take 1 1/2 tablets on Mon Wed and Fri or Take as directed by Coumadin clinic. 120 tablet 1   No current facility-administered medications for this encounter.    Vitals:   11/17/18 1205  BP: 130/80  Pulse: 74  SpO2: 92%  Weight: 117.3 kg (258 lb 9.6 oz)   Wt Readings from Last 3 Encounters:  11/17/18 117.3 kg (258 lb 9.6 oz)  11/01/18 120.9 kg (266 lb 9.6 oz)  08/10/18 119.3 kg (263 lb)    PHYSICAL EXAM: General:  Well appearing. No resp difficulty HEENT: normal Neck: supple. no JVD. Carotids 2+ bilat; no bruits. No lymphadenopathy or thryomegaly appreciated. Cor: PMI nondisplaced. Regular rate & rhythm. + RV lift 2/6 TR Lungs: clear Abdomen: obese soft, nontender, nondistended. No hepatosplenomegaly. No bruits or masses. Good bowel sounds. Extremities: no cyanosis, clubbing, rash, edema Neuro: alert & orientedx3, cranial nerves grossly intact. moves all 4 extremities w/o difficulty. Affect pleasant   ASSESSMENT & PLAN: 1. Chronic Systolic HF with biventricular dysfunction due to NICM - St Jude ICD  - ECHO 07/28/2017 EF 20-25%. RV mildly dilated.  - Echo 11/17/18 EF 20-25% RV dilated and severely HK with septal flattening. Personally reviewed - NYHA III symptoms. - Volume status stable on exam and on ICD check - Continue torsemide 40/20/20  - Continue carvedilol 12.5 mg twice a day.  - Continue digoxin 0.125 mg daily. Check dig level today.  - Continue bidil 1 tab three times a day.  - Continue spiro 25 mg daily. -  He has severe LV dysfunction but also evidence of significant PAH with RV strain. Un able to start sildenafil with Bidl on board. Will repeat RHC with eye to starting ERA.  2. CKD stage 3 - Baseline creatinine ~1.9. Recent Cr 2.8 -> 2.4-> 2.7 -  BMET today  3. CAD - LHC 10/2017 with 50-60% distal left circ and 50% mid RCA stenosis. No significant disease left main or LAD. - No s/s ischemia - Continue statin and bb.  - On coumadin. Denies bleeding.  4. PAF  - On coumadin, followed by coumadin clinic on [place].  - Regular on exam. Denies bleeding.   6. Hx of VT - Has St Jude ICD. Interrogation as above. No VT/VF or ICD shocks  7. Pulmonary HTN - Continue home O2 (2L).  Doesn't always take with him out.  - Repeat RHC - Dr. Halford Chessman repeating sleep study. Needs CPAP and weight loss.   8. OSA with CPAP - see plan as above.   Total time spent 45 minutes. Over half that time spent discussing above.    Glori Bickers, MD  12:46 PM

## 2018-11-17 NOTE — H&P (View-Only) (Signed)
Advanced Heart Failure Clinic Note PCP: Primary Cardiologist: Dr. [facility]   HPI: [name] is a 63 y.o. male with history of longstanding cardiomyopathy out of proportion to CAD/chronic systolic CHF (EF 26-71% 06/4578), prior VT, s/p St Jude ICD, CAD, CVA, OSA on CPAP, pulmonary HTN on O2 at home, PAF on coumadin, HTN, and CKD stage 3.  Admitted from Dr Hochrein's office 10/01/17 with volume overload. HF team was consulted. Underwent R/LHC 10/04/17 - full report below. PYP scan and myeloma panel completed and were not suggestive of cardiac amyloidosis. V/Q scan completed for elevated PA pressures on cath, which was negative. He diuresed 31 lbs with IV lasix and metolazone and transitioned to torsemide 60 mg daily. HF meds were optimized. No ACE/ARB with CKD and soft BPs. Discharge weight 269 pounds.  He presents today for regular follow up. Says he feels about 70%. Takes torsemide 40 in am, 20 in afternoon and 20 in evening. When he takes 40 in am says he doesn't feel like he pees that much but if he takes 40 at night pees too much so has settled on this regimen. Weight down to 254 at home. No edema, orthopnea or PND. Can do ADLs but has to go slowly. Struggling with knee pain. Wearing O2. Says Dr. Halford Chessman told him not to use his old CPAP. Has repeat sleep study next week .  ICD interrogation: No VT/VF. Thoracic impedence was low in May/June but now looks great No ICD shocks. Personally reviewed  Echo today: EF 20-25% RV dilated and severely HK with septal flattening. Personally reviewed   V/Q scan 10/07/17:No appreciable ventilation or perfusion defects.  PYP 10/05/17:Visual and quantitative assessment (grade 0/1, H/CLL equal 1.3) are equivocal for transthyretin amyloidosis.  LHC 10/04/17: Left Main  No significant disease.  Left Anterior Descending  Large, wrap-around LAD with 30% mid vessel stenosis.  Left Circumflex  50-60% distal LCx stenosis.  Right Coronary  Artery  50% mid RCA stenosis.   [place] 10/04/17: RA mean 19 RV 72/23 PA 74/37, mean 51 PCWP mean 19 LV 111/28 AO 112/80 Oxygen saturations: PA 55% AO 95% Cardiac Output (Fick) 4.68  Cardiac Index (Fick) 1.9  PVR 6.8 WU CVP/PCWP 1 PAPi 1.95 1. Nonischemic cardiomyopathy. Coronary disease well out of proportion to degree of cardiomyopathy.  2. Elevated R>L heart filling pressures, suggestive of significant RV failure with high CVP/PCWP ratio and PAPi <2.  3. Low cardiac output.  4. Mixed pulmonary venous/pulmonary arterial hypertension, ?due to hypoxemia from OSA or OHS/OSA.   Review of systems complete and found to be negative unless listed in HPI.    SH:  Social History   Socioeconomic History  . Marital status: Married    Spouse name: Not on file  . Number of children: 2  . Years of education: Not on file  . Highest education level: Not on file  Occupational History  . Occupation: medically retired due to heart failure  Social Needs  . Financial resource strain: Not on file  . Food insecurity    Worry: Not on file    Inability: Not on file  . Transportation needs    Medical: Yes    Non-medical: No  Tobacco Use  . Smoking status: Former Smoker    Quit date: 05/04/1986    Years since quitting: 32.5  . Smokeless tobacco: Never Used  . Tobacco comment: Quit smoking 30 yrs ago. Smoked as teenager less than 1/2 ppd. Smoked x 4 years.  Substance and Sexual Activity  . Alcohol use: No    Comment: occasionally  . Drug use: No  . Sexual activity: Never  Lifestyle  . Physical activity    Days per week: 0 days    Minutes per session: 0 min  . Stress: Not at all  Relationships  . Social Herbalist on phone: Not on file    Gets together: Not on file    Attends religious service: Not on file    Active member of club or organization: Not on file    Attends meetings of clubs or organizations: Not on file    Relationship status: Not on file  . Intimate  partner violence    Fear of current or ex partner: Not on file    Emotionally abused: Not on file    Physically abused: Not on file    Forced sexual activity: Not on file  Other Topics Concern  . Not on file  Social History Narrative  . Not on file    FH:  Family History  Problem Relation Age of Onset  . Coronary artery disease Mother   . Heart attack Mother   . Heart attack Maternal Uncle   . Heart attack Maternal Grandmother     Past Medical History:  Diagnosis Date  . CAD (coronary artery disease)    a. Initial nonobst by cath 2009. b. Cath 01/2013 in setting of VT storm: obstructive distal Cx disease (small and terminates in the AV groove, unlikely to cause significant ischemia or electrical instability), nonobstructive RCA disease, EF 15-20%.   . Cerebrovascular accident [facility])    a. 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 Chronic systolic CHF (congestive heart failure) ([place])    a. Likely NICM (out of proportion to CAD). b. 2009 - EF 25-30% by echo, 01/2013: 15-20% by cath. c. 11/2014 Echo: EF 35-40%, Gr1 DD, mild MR, mod TR, PASP 19mmHg.  . CKD (chronic kidney disease), stage II   . Dyslipidemia   . Gout   . HTN (hypertension)   . Hypokalemia   . ICD (implantable cardiac defibrillator) in place   . Insulin dependent diabetes mellitus ([place])   . Lipoma   . Nonischemic cardiomyopathy ([place])    a.  11/2014 Echo: EF 35-40%, Gr1 DD, mild MR, mod TR, PASP 52mmHg.  . OSA (obstructive sleep apnea)    does not wear cpap  . PAF (paroxysmal atrial fibrillation) ([place])    a. Noted 05/2008 by EKG;  b. CHA2DS2VASc = 5-6-->coumadin.  . Paroxysmal VT ([place])    a. s/p St. Jude ICD 2007. b. H/o paroxysmal VT/VF including VT storm 12/2012 admission prompting amio initiation;  c. 01/2013 ICD upgrade SJM 1411-36Q Ellipse VR single lead ICD.  [name] Pulmonary HTN ([place])    a. Mild by cath 01/2013.    Current Outpatient Medications  Medication Sig Dispense Refill  . acetaminophen (TYLENOL) 500 MG tablet  Take 500-1,000 mg by mouth every 6 (six) hours as needed (for bilateral knee pain).    [name] allopurinol (ZYLOPRIM) 300 MG tablet Take 1 tablet (300 mg total) by mouth daily. 90 tablet 1  . amiodarone (PACERONE) 200 MG tablet Take 1 tablet (200 mg total) by mouth daily. 90 tablet 1  . aspirin EC 81 MG EC tablet Take 1 tablet (81 mg total) by mouth daily. 30 tablet 0  . atorvastatin (LIPITOR) 80 MG tablet Take 1 tablet (80 mg total) by mouth at bedtime. 90 tablet 1  .  carvedilol (COREG) 25 MG tablet Take 0.5 tablets (12.5 mg total) by mouth 2 (two) times daily with a meal. 90 tablet 3  . cholecalciferol (VITAMIN D) 1000 units tablet Take 1,000 Units by mouth daily.    . digoxin (LANOXIN) 0.125 MG tablet Take 1 tablet (0.125 mg total) by mouth every other day. 45 tablet 3  . GLUCOMANNAN PO Take 665 mg by mouth every other day. Patient reports taking EOD    . Insulin Glargine (LANTUS SOLOSTAR) 100 UNIT/ML Solostar Pen INJECT 10 UNITS INTO THE SKIN AT BEDTIME. 15 pen 2  . Insulin Pen Needle (B-D ULTRAFINE III SHORT PEN) 31G X 8 MM MISC USE ONCE DAILY WITH INSULIN 100 each 3  . Insulin Pen Needle (PEN NEEDLES) 31G X 5 MM MISC 100 each by Does not apply route 2 (two) times daily. 100 each 5  . isosorbide-hydrALAZINE (BIDIL) 20-37.5 MG tablet Take 1 tablet by mouth 3 (three) times daily. 270 tablet 3  . LANTUS SOLOSTAR 100 UNIT/ML Solostar Pen INJECT 10 UNITS INTO THE SKIN AT BEDTIME. 15 pen 3  . nitroGLYCERIN (NITROSTAT) 0.4 MG SL tablet PLACE 1 TABLET UNDER TONGUE EVERY 5 MINS X3 DOSES AS NEEDED FOR CHEST PAIN 25 tablet 11  . potassium chloride SA (K-DUR,KLOR-CON) 20 MEQ tablet Take 1 tablet (20 mEq total) by mouth daily. 90 tablet 3  . spironolactone (ALDACTONE) 25 MG tablet Take 1 tablet (25 mg total) by mouth daily. 90 tablet 3  . torsemide (DEMADEX) 20 MG tablet Take by mouth daily. 40 mg in the AM, 20 mg at lunch, 20 mg in the PM with add'l 20 mg prn (bloating)    . traMADol (ULTRAM) 50 MG tablet Take  1 tablet (50 mg total) by mouth every 8 (eight) hours as needed. 60 tablet 2  . Turmeric 500 MG CAPS Take 1,000 mg by mouth daily as needed (inflammation).     . warfarin (COUMADIN) 5 MG tablet Take 1 tablet daily except take 1 1/2 tablets on Mon Wed and Fri or Take as directed by Coumadin clinic. 120 tablet 1   No current facility-administered medications for this encounter.    Vitals:   11/17/18 1205  BP: 130/80  Pulse: 74  SpO2: 92%  Weight: 117.3 kg (258 lb 9.6 oz)   Wt Readings from Last 3 Encounters:  11/17/18 117.3 kg (258 lb 9.6 oz)  11/01/18 120.9 kg (266 lb 9.6 oz)  08/10/18 119.3 kg (263 lb)    PHYSICAL EXAM: General:  Well appearing. No resp difficulty HEENT: normal Neck: supple. no JVD. Carotids 2+ bilat; no bruits. No lymphadenopathy or thryomegaly appreciated. Cor: PMI nondisplaced. Regular rate & rhythm. + RV lift 2/6 TR Lungs: clear Abdomen: obese soft, nontender, nondistended. No hepatosplenomegaly. No bruits or masses. Good bowel sounds. Extremities: no cyanosis, clubbing, rash, edema Neuro: alert & orientedx3, cranial nerves grossly intact. moves all 4 extremities w/o difficulty. Affect pleasant   ASSESSMENT & PLAN: 1. Chronic Systolic HF with biventricular dysfunction due to NICM - St Jude ICD  - ECHO 07/28/2017 EF 20-25%. RV mildly dilated.  - Echo 11/17/18 EF 20-25% RV dilated and severely HK with septal flattening. Personally reviewed - NYHA III symptoms. - Volume status stable on exam and on ICD check - Continue torsemide 40/20/20  - Continue carvedilol 12.5 mg twice a day.  - Continue digoxin 0.125 mg daily. Check dig level today.  - Continue bidil 1 tab three times a day.  - Continue spiro 25 mg daily. -  He has severe LV dysfunction but also evidence of significant PAH with RV strain. Un able to start sildenafil with Bidl on board. Will repeat RHC with eye to starting ERA.  2. CKD stage 3 - Baseline creatinine ~1.9. Recent Cr 2.8 -> 2.4-> 2.7 -  BMET today  3. CAD - LHC 10/2017 with 50-60% distal left circ and 50% mid RCA stenosis. No significant disease left main or LAD. - No s/s ischemia - Continue statin and bb.  - On coumadin. Denies bleeding.  4. PAF  - On coumadin, followed by coumadin clinic on [place].  - Regular on exam. Denies bleeding.   6. Hx of VT - Has St Jude ICD. Interrogation as above. No VT/VF or ICD shocks  7. Pulmonary HTN - Continue home O2 (2L).  Doesn't always take with him out.  - Repeat RHC - Dr. Halford Chessman repeating sleep study. Needs CPAP and weight loss.   8. OSA with CPAP - see plan as above.   Total time spent 45 minutes. Over half that time spent discussing above.    Glori Bickers, MD  12:46 PM

## 2018-11-17 NOTE — Progress Notes (Signed)
  Echocardiogram 2D Echocardiogram has been performed.  Marybelle Killings 11/17/2018, 12:01 PM

## 2018-11-18 ENCOUNTER — Other Ambulatory Visit (HOSPITAL_COMMUNITY)
Admission: RE | Admit: 2018-11-18 | Discharge: 2018-11-18 | Disposition: A | Discharge status: Home / Self Care | Payer: Medicare Other | Source: Ambulatory Visit | Attending: Pulmonary Disease | Admitting: Pulmonary Disease

## 2018-11-18 DIAGNOSIS — Z1159 Encounter for screening for other viral diseases: Secondary | ICD-10-CM | POA: Insufficient documentation

## 2018-11-18 LAB — SARS CORONAVIRUS 2 (TAT 6-24 HRS): SARS Coronavirus 2: NEGATIVE

## 2018-11-22 ENCOUNTER — Ambulatory Visit (HOSPITAL_BASED_OUTPATIENT_CLINIC_OR_DEPARTMENT_OTHER): Payer: Medicare Other | Attending: Pulmonary Disease | Admitting: Pulmonary Disease

## 2018-11-22 ENCOUNTER — Other Ambulatory Visit: Payer: Self-pay

## 2018-11-22 DIAGNOSIS — G4730 Sleep apnea, unspecified: Secondary | ICD-10-CM

## 2018-11-22 DIAGNOSIS — G4733 Obstructive sleep apnea (adult) (pediatric): Secondary | ICD-10-CM | POA: Diagnosis not present

## 2018-11-23 ENCOUNTER — Telehealth: Payer: Self-pay | Admitting: Cardiology

## 2018-11-23 NOTE — Telephone Encounter (Signed)

## 2018-11-23 NOTE — Progress Notes (Deleted)
HPI The patient presents for follow up of CHF.  In March he was in the hospital for volume overload thought to be in part related to medical non adherence.  Echo demonstrated the EF to be 20 - 25% and lower than previous.   Of note he was noted to have a non functioning CPAP at that appt.   He has had difficult to control HTN.  He has had CAD.  However, it was felt that his cardiomyopathy was out of proportion to his coronary disease.  ***   It does not sound like he is always been compliant with his medications.  He has been back for a few appts since that discharge.   He presents for routine follow-up.  Unfortunately he is feeling worse.  By our scales he is up 11 pounds since his last visit in about 20 pounds since his admission.  By his scales he is not a much but after questioning I think these are probably inaccurate.  He has increased lower extremity swelling and abdominal distention.  He is wearing his oxygen which he usually uses as needed now he is using it all the time.  He is short of breath walking short distance on level ground. (Class he says he is been good with his salt in his diet.  He is been compliant with his medications.III).  He has discomfort taking a deep breath and because he feels like his abdominal girth is getting away.  He has not had any cough fevers or chills.  There are no new palpitations, presyncope or syncope.   No Known Allergies  Current Outpatient Medications  Medication Sig Dispense Refill  . acetaminophen (TYLENOL) 500 MG tablet Take 500-1,000 mg by mouth every 6 (six) hours as needed (for bilateral knee pain).    [name] allopurinol (ZYLOPRIM) 300 MG tablet Take 1 tablet (300 mg total) by mouth daily. 90 tablet 1  . amiodarone (PACERONE) 200 MG tablet Take 1 tablet (200 mg total) by mouth daily. 90 tablet 1  . aspirin EC 81 MG EC tablet Take 1 tablet (81 mg total) by mouth daily. 30 tablet 0  . atorvastatin (LIPITOR) 80 MG tablet Take 1 tablet (80 mg  total) by mouth at bedtime. 90 tablet 1  . carvedilol (COREG) 25 MG tablet Take 0.5 tablets (12.5 mg total) by mouth 2 (two) times daily with a meal. 90 tablet 3  . cholecalciferol (VITAMIN D) 1000 units tablet Take 1,000 Units by mouth daily.    . digoxin (LANOXIN) 0.125 MG tablet Take 1 tablet (0.125 mg total) by mouth every other day. 45 tablet 3  . GLUCOMANNAN PO Take 665 mg by mouth every other day. Patient reports taking EOD    . Insulin Glargine (LANTUS SOLOSTAR) 100 UNIT/ML Solostar Pen INJECT 10 UNITS INTO THE SKIN AT BEDTIME. 15 pen 2  . Insulin Pen Needle (B-D ULTRAFINE III SHORT PEN) 31G X 8 MM MISC USE ONCE DAILY WITH INSULIN 100 each 3  . Insulin Pen Needle (PEN NEEDLES) 31G X 5 MM MISC 100 each by Does not apply route 2 (two) times daily. 100 each 5  . isosorbide-hydrALAZINE (BIDIL) 20-37.5 MG tablet Take 2 tablets by mouth 3 (three) times daily. 180 tablet 12  . LANTUS SOLOSTAR 100 UNIT/ML Solostar Pen INJECT 10 UNITS INTO THE SKIN AT BEDTIME. 15 pen 3  . nitroGLYCERIN (NITROSTAT) 0.4 MG SL tablet PLACE 1 TABLET UNDER TONGUE EVERY 5 MINS X3 DOSES AS NEEDED FOR  CHEST PAIN 25 tablet 11  . potassium chloride SA (K-DUR,KLOR-CON) 20 MEQ tablet Take 1 tablet (20 mEq total) by mouth daily. 90 tablet 3  . spironolactone (ALDACTONE) 25 MG tablet Take 1 tablet (25 mg total) by mouth daily. 90 tablet 3  . torsemide (DEMADEX) 20 MG tablet Take by mouth daily. 40 mg in the AM, 20 mg at lunch, 20 mg in the PM with add'l 20 mg prn (bloating)    . traMADol (ULTRAM) 50 MG tablet Take 1 tablet (50 mg total) by mouth every 8 (eight) hours as needed. 60 tablet 2  . Turmeric 500 MG CAPS Take 1,000 mg by mouth daily as needed (inflammation).     . warfarin (COUMADIN) 5 MG tablet Take 1 tablet daily except take 1 1/2 tablets on Mon Wed and Fri or Take as directed by Coumadin clinic. 120 tablet 1   No current facility-administered medications for this visit.     Past Medical History:  Diagnosis Date   . CAD (coronary artery disease)    a. Initial nonobst by cath 2009. b. Cath 01/2013 in setting of VT storm: obstructive distal Cx disease (small and terminates in the AV groove, unlikely to cause significant ischemia or electrical instability), nonobstructive RCA disease, EF 15-20%.   . Cerebrovascular accident [facility])    a. 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 Chronic systolic CHF (congestive heart failure) ([place])    a. Likely NICM (out of proportion to CAD). b. 2009 - EF 25-30% by echo, 01/2013: 15-20% by cath. c. 11/2014 Echo: EF 35-40%, Gr1 DD, mild MR, mod TR, PASP 95mmHg.  . CKD (chronic kidney disease), stage II   . Dyslipidemia   . Gout   . HTN (hypertension)   . Hypokalemia   . ICD (implantable cardiac defibrillator) in place   . Insulin dependent diabetes mellitus ([place])   . Lipoma   . Nonischemic cardiomyopathy ([place])    a.  11/2014 Echo: EF 35-40%, Gr1 DD, mild MR, mod TR, PASP 70mmHg.  . OSA (obstructive sleep apnea)    does not wear cpap  . PAF (paroxysmal atrial fibrillation) ([place])    a. Noted 05/2008 by EKG;  b. CHA2DS2VASc = 5-6-->coumadin.  . Paroxysmal VT ([place])    a. s/p St. Jude ICD 2007. b. H/o paroxysmal VT/VF including VT storm 12/2012 admission prompting amio initiation;  c. 01/2013 ICD upgrade SJM 1411-36Q Ellipse VR single lead ICD.  [name] Pulmonary HTN ([place])    a. Mild by cath 01/2013.    Past Surgical History:  Procedure Laterality Date  . CARDIAC CATHETERIZATION     Nonobstructive coronary disease 2009  . CARDIAC DEFIBRILLATOR PLACEMENT     ICD-St. Jude  . IMPLANTABLE CARDIOVERTER DEFIBRILLATOR (ICD) GENERATOR CHANGE N/A 01/15/2014   Procedure: ICD GENERATOR CHANGE;  Surgeon: Evans Lance, MD;  Location: [facility] CATH LAB;  Service: Cardiovascular;  Laterality: N/A;  . LEFT AND RIGHT HEART CATHETERIZATION WITH CORONARY ANGIOGRAM N/A 01/03/2013   Procedure: LEFT AND RIGHT HEART CATHETERIZATION WITH CORONARY ANGIOGRAM;  Surgeon: Peter M Martinique, MD;  Location: [facility] CATH LAB;   Service: Cardiovascular;  Laterality: N/A;  . LIPOMA EXCISION    . RIGHT/LEFT HEART CATH AND CORONARY ANGIOGRAPHY N/A 10/04/2017   Procedure: RIGHT/LEFT HEART CATH AND CORONARY ANGIOGRAPHY;  Surgeon: Larey Dresser, MD;  Location: [facility] CV LAB;  Service: Cardiovascular;  Laterality: N/A;    Social History   Socioeconomic History  . Marital status: Married    Spouse name: Not  on file  . Number of children: 2  . Years of education: Not on file  . Highest education level: Not on file  Occupational History  . Occupation: medically retired due to heart failure  Social Needs  . Financial resource strain: Not on file  . Food insecurity    Worry: Not on file    Inability: Not on file  . Transportation needs    Medical: Yes    Non-medical: No  Tobacco Use  . Smoking status: Former Smoker    Quit date: 05/04/1986    Years since quitting: 32.5  . Smokeless tobacco: Never Used  . Tobacco comment: Quit smoking 30 yrs ago. Smoked as teenager less than 1/2 ppd. Smoked x 4 years.  Substance and Sexual Activity  . Alcohol use: No    Comment: occasionally  . Drug use: No  . Sexual activity: Never  Lifestyle  . Physical activity    Days per week: 0 days    Minutes per session: 0 min  . Stress: Not at all  Relationships  . Social Herbalist on phone: Not on file    Gets together: Not on file    Attends religious service: Not on file    Active member of club or organization: Not on file    Attends meetings of clubs or organizations: Not on file    Relationship status: Not on file  Other Topics Concern  . Not on file  Social History Narrative  . Not on file   Family History  Problem Relation Age of Onset  . Coronary artery disease Mother   . Heart attack Mother   . Heart attack Maternal Uncle   . Heart attack Maternal Grandmother     ROS:   ***  PHYSICAL EXAM There were no vitals taken for this visit.  GENERAL:  Well appearing NECK:  No jugular venous  distention, waveform within normal limits, carotid upstroke brisk and symmetric, no bruits, no thyromegaly LUNGS:  Clear to auscultation bilaterally CHEST:  Unremarkable HEART:  PMI not displaced or sustained,S1 and S2 within normal limits, no S3, no S4, no clicks, no rubs, *** murmurs ABD:  Flat, positive bowel sounds normal in frequency in pitch, no bruits, no rebound, no guarding, no midline pulsatile mass, no hepatomegaly, no splenomegaly EXT:  2 plus pulses throughout, no edema, no cyanosis no clubbing    ***GENERAL:  Well appearing HEENT:  Pupils equal round and reactive, fundi not visualized, oral mucosa unremarkable NECK:  Positive jugular venous distention 10 cm at 45 degrees, waveform within normal limits, carotid upstroke brisk and symmetric, no bruits, no thyromegaly LYMPHATICS:  No cervical, inguinal adenopathy LUNGS:  Clear to auscultation bilaterally BACK:  No CVA tenderness CHEST:  Unremarkable HEART:  PMI not displaced or sustained,S1 and S2 within normal limits, no S3, no S4, no clicks, no rubs, NO murmurs ABD:  Flat, positive bowel sounds normal in frequency in pitch, no bruits, no rebound, no guarding, no midline pulsatile mass, no hepatomegaly, no splenomegaly EXT:  2 plus pulses throughout, severe edema, no cyanosis no clubbing SKIN:  No rashes no nodules NEURO:  Cranial nerves II through XII grossly intact, motor grossly intact throughout PSYCH:  Cognitively intact, oriented to person place and time   EKG:   Sinus rhythm, rate ***, interventricular conduction delay, premature ectopic complexes.  11/23/2018   Lab Results  Component Value Date   TSH 1.14 07/21/2018   ALT 42 11/01/2018   AST  45 (H) 11/01/2018   ALKPHOS 146 (H) 11/01/2018   BILITOT 2.4 (H) 11/01/2018   PROT 7.1 11/01/2018   ALBUMIN 3.9 11/01/2018   Lab Results  Component Value Date   CREATININE 2.45 (H) 11/17/2018   Lab Results  Component Value Date   CHOL 137 02/08/2018   TRIG 78.0  02/08/2018   HDL 33.00 (L) 02/08/2018   LDLCALC 88 02/08/2018   LDLDIRECT 194.6 05/09/2007   Lab Results  Component Value Date   HGBA1C 6.5 07/21/2018    ASSESSMENT AND PLAN  Congestive heart failure, systolic-  ***  Patient has a decompensated congestive heart failure.  Needs admission for IV diuresis and consultation with advanced heart failure team.  Is been difficult to titrate his medications because of renal insufficiency and hypotension and medical nonadherence.  However, we can initiate afterload reduction in the hospital and consider advanced therapies.  We should consider PYP scanning to screen for amyloid  HYPERTENSION -  His blood pressures is *** to be treated on the context of treating his HF.   Atrial fibrillation-  ***  He is had no symptomatic paroxysms.  Continue current therapy.    CKD III - Creat was *** as above.     HYPERLIPIDEMIA - His LDL was *** recently 87.  He needs follow up lipids when admitted.   ICD - He is up to date with follow up remotely.     OBESITY - This needs to continue to be addressed.   DM - A1c was 5.6.

## 2018-11-24 ENCOUNTER — Ambulatory Visit: Payer: Medicare Other | Admitting: Cardiology

## 2018-11-24 DIAGNOSIS — I270 Primary pulmonary hypertension: Secondary | ICD-10-CM | POA: Diagnosis not present

## 2018-11-24 DIAGNOSIS — J9611 Chronic respiratory failure with hypoxia: Secondary | ICD-10-CM | POA: Diagnosis not present

## 2018-11-24 DIAGNOSIS — I5042 Chronic combined systolic (congestive) and diastolic (congestive) heart failure: Secondary | ICD-10-CM | POA: Diagnosis not present

## 2018-11-25 ENCOUNTER — Encounter: Payer: Self-pay | Admitting: Pulmonary Disease

## 2018-11-25 ENCOUNTER — Ambulatory Visit: Payer: Medicare Other | Admitting: Pulmonary Disease

## 2018-11-25 ENCOUNTER — Other Ambulatory Visit: Payer: Self-pay

## 2018-11-25 VITALS — BP 122/80 | HR 78 | Temp 97.9°F | Ht 71.0 in | Wt 260.6 lb

## 2018-11-25 DIAGNOSIS — I272 Pulmonary hypertension, unspecified: Secondary | ICD-10-CM

## 2018-11-25 DIAGNOSIS — G4730 Sleep apnea, unspecified: Secondary | ICD-10-CM

## 2018-11-25 DIAGNOSIS — J9611 Chronic respiratory failure with hypoxia: Secondary | ICD-10-CM

## 2018-11-25 DIAGNOSIS — I5042 Chronic combined systolic (congestive) and diastolic (congestive) heart failure: Secondary | ICD-10-CM | POA: Diagnosis not present

## 2018-11-25 NOTE — Procedures (Signed)
     Patient Name: [name], [name] Date: 11/22/2018 Gender: Male D.O.B: 28-May-1955 Age (years): 79 Referring Provider: Chesley Mires MD, ABSM Height (inches): 71 Interpreting Physician: Chesley Mires MD, ABSM Weight (lbs): 256 RPSGT: Zadie Rhine BMI: 36 MRN: [id number] Neck Size: 16.50  CLINICAL INFORMATION Sleep Study Type: NPSG  Indication for sleep study: OSA  Epworth Sleepiness Score: 3  SLEEP STUDY TECHNIQUE As per the AASM Manual for the Scoring of Sleep and Associated Events v2.3 (April 2016) with a hypopnea requiring 4% desaturations.  The channels recorded and monitored were frontal, central and occipital EEG, electrooculogram (EOG), submentalis EMG (chin), nasal and oral airflow, thoracic and abdominal wall motion, anterior tibialis EMG, snore microphone, electrocardiogram, and pulse oximetry.  MEDICATIONS Medications self-administered by patient taken the night of the study : WARFIN, CARVEDILOL, LANTUS SOLOSTAR, BIDIL, LIPTOR  SLEEP ARCHITECTURE The study was initiated at 9:54:42 PM and ended at 4:31:14 AM.  Sleep onset time was 21.5 minutes and the sleep efficiency was 71.2%%. The total sleep time was 282.5 minutes.  Stage REM latency was 27.5 minutes.  The patient spent 5.7%% of the night in stage N1 sleep, 69.9%% in stage N2 sleep, 0.0%% in stage N3 and 24.4% in REM.  Alpha intrusion was absent.  Supine sleep was 0.00%.  RESPIRATORY PARAMETERS The overall apnea/hypopnea index (AHI) was 1.7 per hour. There were 0 total apneas, including 0 obstructive, 0 central and 0 mixed apneas. There were 8 hypopneas and 71 RERAs.  The AHI during Stage REM sleep was 6.1 per hour.  AHI while supine was N/A per hour.  The mean oxygen saturation was 91.9%. The minimum SpO2 during sleep was 85.0%.  soft snoring was noted during this study.  CARDIAC DATA The 2 lead EKG demonstrated sinus rhythm, pacemaker generated. The mean heart rate was 71.9 beats per minute.  Other EKG findings include: PVCs.  LEG MOVEMENT DATA The total PLMS were 0 with a resulting PLMS index of 0.0. Associated arousal with leg movement index was 0.0 .  IMPRESSIONS - No significant obstructive sleep apnea occurred during this study (AHI = 1.7/h). - No significant central sleep apnea occurred during this study (CAI = 0.0/h). - Mild oxygen desaturation was noted during this study (Min O2 = 85.0%).  Study was conducted with him using 2 liters supplemental oxygen. - The patient snored with soft snoring volume.  DIAGNOSIS - Nocturnal Hypoxemia (327.26 [G47.36 ICD-10])  RECOMMENDATIONS - Continue 2 liters oxygen.  [Electronically signed] 11/25/2018 11:29 AM  Chesley Mires MD, ABSM Diplomate, American Board of Sleep Medicine   NPI: [id number]

## 2018-11-25 NOTE — Progress Notes (Signed)
[facility]  Chief Complaint  Patient presents with  . Follow-up    Patient reports that his breathing has been doing well. He reports that he can get out and do some exercise. He uses oxygen while ambulating and with sleep.     Constitutional:  BP 122/80 (BP Location: Left Arm, Cuff Size: Normal)   Pulse 78   Temp 97.9 F (36.6 C) (Oral)   Ht 5\' 11"  (1.803 m)   Wt 260 lb 10.1 oz (118.2 kg)   SpO2 98%   BMI 36.35 kg/m   Past Medical History:  Pneumonia with septic shock 2016, PAF, CAD, HTN, HLD, CVA, combined CHF, pulmonary hypertension, CKD, DM, Gout, VT s/p ICD  Brief Summary:  [name] is a 63 y.o. male with chronic respiratory failure.  He had sleep study earlier this week.  Wore 2 liters oxygen.  No significant sleep apnea.  PFT not done yet.  Has RHC scheduled for August 4.  Denies cough, wheeze, sputum.  Gets intermittent chest discomfort in Lt side >> not having now.  Wears 2 liters oxygen 24/7.  Gets supplies from [place].  Physical Exam:   Appearance - wearing oxygen  ENMT - no sinus tenderness, no nasal discharge, no oral exudate  Neck - no masses, trachea midline, no thyromegaly, no elevation in JVP  Respiratory - normal appearance of chest wall, normal respiratory effort w/o accessory muscle use, no dullness on percussion, no wheezing or rales  CV - s1s2 regular rate and rhythm, no murmurs, no peripheral edema, radial pulses symmetric  GI - soft, non tender  Lymph - no adenopathy noted in neck and axillary areas  MSK - sitting in wheelchair  Ext - no cyanosis, clubbing, or joint inflammation noted  Skin - no rashes, lesions, or ulcers  Neuro - normal strength, oriented x 3  Psych - normal mood and affect   Discussion:  He has chronic respiratory failure in the setting of congestive heart failure and pulmonary hypertension.  Recent sleep study did not show sleep apnea.  Assessment/Plan:    Chronic respiratory failure. 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 - f/u with nephrology   Patient Instructions  Will make sure your pulmonary function test is scheduled  Continue using 2 liters oxygen  Follow up in 2 months    Chesley Mires, MD [place] Pager: [phone] 11/25/2018, 11:32 AM  Flow Sheet     Pulmonary tests:  V/Q 10/07/17 >> very low probability for PE  Sleep tests:  PSG 07/29/04 >> AHI 8, SpO2 low 90% PSG 11/22/18 >> AHI 1.7, SpO2 low 85%.  Used 2 liters oxygen.  Cardiac tests:  Echo 07/28/17 >> EF 20 to 25%, mod LVH, ascending aorta 43 mmHg, PAS 40 mmHg RHC 10/04/17 >> RA mean 19, RV 72/23, PA 74/37 mean 51, PCWP 19, CI 1.9, PVR 6.8 WU  Medications:   Allergies as of 11/25/2018   No Known Allergies     Medication List       Accurate as of November 25, 2018 11:32 AM. If you have any questions, ask your nurse or doctor.        acetaminophen 500 MG tablet Commonly known as: TYLENOL Take 500-1,000 mg by mouth every 6 (  six) hours as needed (for bilateral knee pain).   allopurinol 300 MG tablet Commonly known as: ZYLOPRIM Take 1 tablet (300 mg total) by mouth daily.   amiodarone 200 MG tablet Commonly known as: PACERONE Take 1 tablet (200 mg total) by mouth daily.   aspirin 81 MG EC tablet Take 1 tablet (81 mg total) by mouth daily.   atorvastatin 80 MG tablet Commonly known as: LIPITOR Take 1 tablet (80 mg total) by mouth at bedtime.   carvedilol 25 MG tablet Commonly known as: COREG Take 0.5 tablets (12.5 mg total) by mouth 2 (two) times daily with a meal.   cholecalciferol 1000 units tablet Commonly known as: VITAMIN D Take 1,000 Units by mouth daily.   digoxin 0.125 MG tablet Commonly known as: LANOXIN Take 1 tablet (0.125 mg  total) by mouth every other day.   GLUCOMANNAN PO Take 665 mg by mouth every other day. Patient reports taking EOD   Insulin Glargine 100 UNIT/ML Solostar Pen Commonly known as: Lantus SoloStar INJECT 10 UNITS INTO THE SKIN AT BEDTIME.   Lantus SoloStar 100 UNIT/ML Solostar Pen Generic drug: Insulin Glargine INJECT 10 UNITS INTO THE SKIN AT BEDTIME.   isosorbide-hydrALAZINE 20-37.5 MG tablet Commonly known as: BIDIL Take 2 tablets by mouth 3 (three) times daily.   nitroGLYCERIN 0.4 MG SL tablet Commonly known as: NITROSTAT PLACE 1 TABLET UNDER TONGUE EVERY 5 MINS X3 DOSES AS NEEDED FOR CHEST PAIN   Pen Needles 31G X 5 MM Misc 100 each by Does not apply route 2 (two) times daily.   Insulin Pen Needle 31G X 8 MM Misc Commonly known as: B-D ULTRAFINE III SHORT PEN USE ONCE DAILY WITH INSULIN   potassium chloride SA 20 MEQ tablet Commonly known as: K-DUR Take 1 tablet (20 mEq total) by mouth daily.   spironolactone 25 MG tablet Commonly known as: ALDACTONE Take 1 tablet (25 mg total) by mouth daily.   torsemide 20 MG tablet Commonly known as: DEMADEX Take by mouth daily. 40 mg in the AM, 20 mg at lunch, 20 mg in the PM with add'l 20 mg prn (bloating)   traMADol 50 MG tablet Commonly known as: ULTRAM Take 1 tablet (50 mg total) by mouth every 8 (eight) hours as needed.   Turmeric 500 MG Caps Take 1,000 mg by mouth daily as needed (inflammation).   warfarin 5 MG tablet Commonly known as: COUMADIN Take as directed by the anticoagulation clinic. If you are unsure how to take this medication, talk to your nurse or doctor. Original instructions: Take 1 tablet daily except take 1 1/2 tablets on Mon Wed and Fri or Take as directed by Coumadin clinic.       Past Surgical History:  He  has a past surgical history that includes Cardiac catheterization; Cardiac defibrillator placement; Lipoma excision; left and right heart catheterization with coronary angiogram (N/A,  01/03/2013); implantable cardioverter defibrillator (icd) generator change (N/A, 01/15/2014); and RIGHT/LEFT HEART CATH AND CORONARY ANGIOGRAPHY (N/A, 10/04/2017).  Family History:  His family history includes Coronary artery disease in his mother; Heart attack in his maternal grandmother, maternal uncle, and mother.  Social History:  He  reports that he quit smoking about 32 years ago. He has never used smokeless tobacco. He reports that he does not drink alcohol or use drugs.

## 2018-11-25 NOTE — Patient Instructions (Signed)
Will make sure your pulmonary function test is scheduled  Continue using 2 liters oxygen  Follow up in 2 months

## 2018-11-26 ENCOUNTER — Other Ambulatory Visit: Payer: Self-pay | Admitting: Internal Medicine

## 2018-11-29 ENCOUNTER — Other Ambulatory Visit: Payer: Self-pay

## 2018-11-29 ENCOUNTER — Ambulatory Visit (INDEPENDENT_AMBULATORY_CARE_PROVIDER_SITE_OTHER): Payer: Medicare Other | Admitting: General Practice

## 2018-11-29 DIAGNOSIS — Z7901 Long term (current) use of anticoagulants: Secondary | ICD-10-CM | POA: Diagnosis not present

## 2018-11-29 DIAGNOSIS — Z8679 Personal history of other diseases of the circulatory system: Secondary | ICD-10-CM

## 2018-11-29 DIAGNOSIS — I4891 Unspecified atrial fibrillation: Secondary | ICD-10-CM

## 2018-11-29 LAB — POCT INR: INR: 2.2 (ref 2.0–3.0)

## 2018-11-29 NOTE — Patient Instructions (Addendum)
Pre visit review using our clinic review tool, if applicable. No additional management support is needed unless otherwise documented below in the visit note.  Continue to take 1 tablet daily except 1 1/2 tablets on Monday and Wed and Fridays.  Re-check in 6 weeks.

## 2018-12-01 DIAGNOSIS — G4733 Obstructive sleep apnea (adult) (pediatric): Secondary | ICD-10-CM | POA: Diagnosis not present

## 2018-12-01 DIAGNOSIS — R0902 Hypoxemia: Secondary | ICD-10-CM | POA: Diagnosis not present

## 2018-12-01 DIAGNOSIS — I509 Heart failure, unspecified: Secondary | ICD-10-CM | POA: Diagnosis not present

## 2018-12-01 DIAGNOSIS — I5043 Acute on chronic combined systolic (congestive) and diastolic (congestive) heart failure: Secondary | ICD-10-CM | POA: Diagnosis not present

## 2018-12-02 ENCOUNTER — Other Ambulatory Visit (HOSPITAL_COMMUNITY)
Admission: RE | Admit: 2018-12-02 | Discharge: 2018-12-02 | Disposition: A | Discharge status: Home / Self Care | Payer: Medicare Other | Source: Ambulatory Visit | Attending: Internal Medicine | Admitting: Internal Medicine

## 2018-12-02 DIAGNOSIS — Z20828 Contact with and (suspected) exposure to other viral communicable diseases: Secondary | ICD-10-CM | POA: Insufficient documentation

## 2018-12-02 DIAGNOSIS — Z01812 Encounter for preprocedural laboratory examination: Secondary | ICD-10-CM | POA: Diagnosis not present

## 2018-12-02 LAB — SARS CORONAVIRUS 2 (TAT 6-24 HRS): SARS Coronavirus 2: NEGATIVE

## 2018-12-05 ENCOUNTER — Telehealth: Payer: Self-pay | Admitting: *Deleted

## 2018-12-05 DIAGNOSIS — I472 Ventricular tachycardia, unspecified: Secondary | ICD-10-CM

## 2018-12-05 NOTE — Telephone Encounter (Signed)
LMOVM requesting call back to DC. Gave direct DC phone number.  [facility] alert received for VT episode on 12/03/18, ATP x2 unsuccessful, VT terminated with 30J shock x1.

## 2018-12-05 NOTE — Telephone Encounter (Signed)
Patient returned call. He denies symptoms with episode, reports he was asleep at the time (occurred at 02:22). Feeling fine right now, just got back from the barber. Compliant with cardiac medications, including amiodarone. Pt took one nitro last week for chest discomfort. Thoracic impedance at baseline per device. Advised of [place] DMV driving restrictions x6 months. Advised I will call back with any MD recommendations. Pt verbalizes understanding and denies questions at this time.  Routed to Dr. Lovena Le and Dr. Haroldine Laws for review.

## 2018-12-06 ENCOUNTER — Other Ambulatory Visit: Payer: Self-pay

## 2018-12-06 ENCOUNTER — Encounter (HOSPITAL_COMMUNITY)
Admission: RE | Disposition: A | Discharge status: Home / Self Care | Payer: Self-pay | Source: Home / Self Care | Attending: Internal Medicine

## 2018-12-06 ENCOUNTER — Ambulatory Visit (HOSPITAL_COMMUNITY)
Admission: RE | Admit: 2018-12-06 | Discharge: 2018-12-06 | Disposition: A | Discharge status: Home / Self Care | Payer: Medicare Other | Attending: Internal Medicine | Admitting: Internal Medicine

## 2018-12-06 DIAGNOSIS — I428 Other cardiomyopathies: Secondary | ICD-10-CM | POA: Insufficient documentation

## 2018-12-06 DIAGNOSIS — I130 Hypertensive heart and chronic kidney disease with heart failure and stage 1 through stage 4 chronic kidney disease, or unspecified chronic kidney disease: Secondary | ICD-10-CM | POA: Insufficient documentation

## 2018-12-06 DIAGNOSIS — Z79899 Other long term (current) drug therapy: Secondary | ICD-10-CM | POA: Diagnosis not present

## 2018-12-06 DIAGNOSIS — I5023 Acute on chronic systolic (congestive) heart failure: Secondary | ICD-10-CM | POA: Diagnosis not present

## 2018-12-06 DIAGNOSIS — N183 Chronic kidney disease, stage 3 (moderate): Secondary | ICD-10-CM | POA: Diagnosis not present

## 2018-12-06 DIAGNOSIS — I2510 Atherosclerotic heart disease of native coronary artery without angina pectoris: Secondary | ICD-10-CM

## 2018-12-06 DIAGNOSIS — Z7982 Long term (current) use of aspirin: Secondary | ICD-10-CM | POA: Diagnosis not present

## 2018-12-06 DIAGNOSIS — Z794 Long term (current) use of insulin: Secondary | ICD-10-CM | POA: Diagnosis not present

## 2018-12-06 DIAGNOSIS — Z87891 Personal history of nicotine dependence: Secondary | ICD-10-CM | POA: Insufficient documentation

## 2018-12-06 DIAGNOSIS — Z8249 Family history of ischemic heart disease and other diseases of the circulatory system: Secondary | ICD-10-CM | POA: Diagnosis not present

## 2018-12-06 DIAGNOSIS — M109 Gout, unspecified: Secondary | ICD-10-CM | POA: Insufficient documentation

## 2018-12-06 DIAGNOSIS — I2721 Secondary pulmonary arterial hypertension: Secondary | ICD-10-CM | POA: Diagnosis not present

## 2018-12-06 DIAGNOSIS — Z8673 Personal history of transient ischemic attack (TIA), and cerebral infarction without residual deficits: Secondary | ICD-10-CM | POA: Diagnosis not present

## 2018-12-06 DIAGNOSIS — I454 Nonspecific intraventricular block: Secondary | ICD-10-CM | POA: Insufficient documentation

## 2018-12-06 DIAGNOSIS — E785 Hyperlipidemia, unspecified: Secondary | ICD-10-CM | POA: Insufficient documentation

## 2018-12-06 DIAGNOSIS — Z95810 Presence of automatic (implantable) cardiac defibrillator: Secondary | ICD-10-CM | POA: Diagnosis not present

## 2018-12-06 DIAGNOSIS — G4733 Obstructive sleep apnea (adult) (pediatric): Secondary | ICD-10-CM | POA: Insufficient documentation

## 2018-12-06 DIAGNOSIS — I480 Paroxysmal atrial fibrillation: Secondary | ICD-10-CM | POA: Diagnosis not present

## 2018-12-06 DIAGNOSIS — E1122 Type 2 diabetes mellitus with diabetic chronic kidney disease: Secondary | ICD-10-CM | POA: Diagnosis not present

## 2018-12-06 DIAGNOSIS — Z7901 Long term (current) use of anticoagulants: Secondary | ICD-10-CM | POA: Insufficient documentation

## 2018-12-06 DIAGNOSIS — I5042 Chronic combined systolic (congestive) and diastolic (congestive) heart failure: Secondary | ICD-10-CM

## 2018-12-06 HISTORY — PX: RIGHT/LEFT HEART CATH AND CORONARY ANGIOGRAPHY: CATH118266

## 2018-12-06 LAB — POCT I-STAT EG7
Acid-Base Excess: 3 mmol/L — ABNORMAL HIGH (ref 0.0–2.0)
Acid-Base Excess: 3 mmol/L — ABNORMAL HIGH (ref 0.0–2.0)
Bicarbonate: 27.7 mmol/L (ref 20.0–28.0)
Bicarbonate: 28.3 mmol/L — ABNORMAL HIGH (ref 20.0–28.0)
Calcium, Ion: 1.16 mmol/L (ref 1.15–1.40)
Calcium, Ion: 1.22 mmol/L (ref 1.15–1.40)
HCT: 40 % (ref 39.0–52.0)
HCT: 41 % (ref 39.0–52.0)
Hemoglobin: 13.6 g/dL (ref 13.0–17.0)
Hemoglobin: 13.9 g/dL (ref 13.0–17.0)
O2 Saturation: 65 %
O2 Saturation: 67 %
Potassium: 4 mmol/L (ref 3.5–5.1)
Potassium: 4.2 mmol/L (ref 3.5–5.1)
Sodium: 143 mmol/L (ref 135–145)
Sodium: 147 mmol/L — ABNORMAL HIGH (ref 135–145)
TCO2: 29 mmol/L (ref 22–32)
TCO2: 30 mmol/L (ref 22–32)
pCO2, Ven: 43.7 mmHg — ABNORMAL LOW (ref 44.0–60.0)
pCO2, Ven: 45.1 mmHg (ref 44.0–60.0)
pH, Ven: 7.406 (ref 7.250–7.430)
pH, Ven: 7.411 (ref 7.250–7.430)
pO2, Ven: 34 mmHg (ref 32.0–45.0)
pO2, Ven: 35 mmHg (ref 32.0–45.0)

## 2018-12-06 LAB — BASIC METABOLIC PANEL
Anion gap: 11 (ref 5–15)
BUN: 49 mg/dL — ABNORMAL HIGH (ref 8–23)
CO2: 27 mmol/L (ref 22–32)
Calcium: 9.3 mg/dL (ref 8.9–10.3)
Chloride: 102 mmol/L (ref 98–111)
Creatinine, Ser: 2.68 mg/dL — ABNORMAL HIGH (ref 0.61–1.24)
GFR calc Af Amer: 28 mL/min — ABNORMAL LOW (ref >60)
GFR calc non Af Amer: 24 mL/min — ABNORMAL LOW (ref >60)
Glucose, Bld: 114 mg/dL — ABNORMAL HIGH (ref 70–99)
Potassium: 4.3 mmol/L (ref 3.5–5.1)
Sodium: 140 mmol/L (ref 135–145)

## 2018-12-06 LAB — POCT I-STAT 7, (LYTES, BLD GAS, ICA,H+H)
Acid-Base Excess: 2 mmol/L (ref 0.0–2.0)
Bicarbonate: 26.6 mmol/L (ref 20.0–28.0)
Calcium, Ion: 1.17 mmol/L (ref 1.15–1.40)
HCT: 40 % (ref 39.0–52.0)
Hemoglobin: 13.6 g/dL (ref 13.0–17.0)
O2 Saturation: 96 %
Potassium: 4.1 mmol/L (ref 3.5–5.1)
Sodium: 145 mmol/L (ref 135–145)
TCO2: 28 mmol/L (ref 22–32)
pCO2 arterial: 40.5 mmHg (ref 32.0–48.0)
pH, Arterial: 7.425 (ref 7.350–7.450)
pO2, Arterial: 82 mmHg — ABNORMAL LOW (ref 83.0–108.0)

## 2018-12-06 LAB — PROTIME-INR
INR: 1.9 — ABNORMAL HIGH (ref 0.8–1.2)
Prothrombin Time: 21.1 seconds — ABNORMAL HIGH (ref 11.4–15.2)

## 2018-12-06 SURGERY — RIGHT/LEFT HEART CATH AND CORONARY ANGIOGRAPHY
Anesthesia: LOCAL

## 2018-12-06 MED ORDER — VERAPAMIL HCL 2.5 MG/ML IV SOLN
INTRAVENOUS | Status: AC
  Filled 2018-12-06: qty 2

## 2018-12-06 MED ORDER — HEPARIN (PORCINE) IN NACL 1000-0.9 UT/500ML-% IV SOLN
INTRAVENOUS | Status: AC
  Filled 2018-12-06: qty 1000

## 2018-12-06 MED ORDER — LIDOCAINE HCL (PF) 1 % IJ SOLN
INTRAMUSCULAR | Status: AC
  Filled 2018-12-06: qty 30

## 2018-12-06 MED ORDER — IOHEXOL 350 MG/ML SOLN
INTRAVENOUS | Status: DC | PRN
  Administered 2018-12-06: 40 mL via INTRA_ARTERIAL

## 2018-12-06 MED ORDER — LABETALOL HCL 5 MG/ML IV SOLN: 10.0000 mg | INTRAVENOUS | Status: DC | PRN

## 2018-12-06 MED ORDER — HYDRALAZINE HCL 20 MG/ML IJ SOLN: 10.0000 mg | INTRAMUSCULAR | Status: DC | PRN

## 2018-12-06 MED ORDER — HEPARIN (PORCINE) IN NACL 1000-0.9 UT/500ML-% IV SOLN
INTRAVENOUS | Status: DC | PRN
  Administered 2018-12-06 (×2): 500 mL

## 2018-12-06 MED ORDER — ASPIRIN 81 MG PO CHEW: 81.0000 mg | CHEWABLE_TABLET | ORAL | Status: DC

## 2018-12-06 MED ORDER — SODIUM CHLORIDE 0.9% FLUSH: 3.0000 mL | INTRAVENOUS | Status: DC | PRN

## 2018-12-06 MED ORDER — SODIUM CHLORIDE 0.9 % IV SOLN: 250.0000 mL | INTRAVENOUS | Status: DC | PRN

## 2018-12-06 MED ORDER — VERAPAMIL HCL 2.5 MG/ML IV SOLN
INTRAVENOUS | Status: DC | PRN
  Administered 2018-12-06: 14:00:00 10 mL via INTRA_ARTERIAL

## 2018-12-06 MED ORDER — SODIUM CHLORIDE 0.9 % IV SOLN: INTRAVENOUS | Status: DC

## 2018-12-06 MED ORDER — HEPARIN SODIUM (PORCINE) 1000 UNIT/ML IJ SOLN
INTRAMUSCULAR | Status: DC | PRN
  Administered 2018-12-06: 5000 [IU] via INTRAVENOUS

## 2018-12-06 MED ORDER — ACETAMINOPHEN 325 MG PO TABS: 650.0000 mg | ORAL_TABLET | ORAL | Status: DC | PRN

## 2018-12-06 MED ORDER — SODIUM CHLORIDE 0.9% FLUSH: 3.0000 mL | Freq: Two times a day (BID) | INTRAVENOUS | Status: DC

## 2018-12-06 MED ORDER — ONDANSETRON HCL 4 MG/2ML IJ SOLN: 4.0000 mg | Freq: Four times a day (QID) | INTRAMUSCULAR | Status: DC | PRN

## 2018-12-06 MED ORDER — LIDOCAINE HCL (PF) 1 % IJ SOLN
INTRAMUSCULAR | Status: DC | PRN
  Administered 2018-12-06 (×2): 2 mL

## 2018-12-06 SURGICAL SUPPLY — 14 items
CATH 5FR JL3.5 JR4 ANG PIG MP (CATHETERS) ×2 IMPLANT
CATH SWAN GANZ 7F STRAIGHT (CATHETERS) ×2 IMPLANT
COVER DOME SNAP 22 D (MISCELLANEOUS) ×2 IMPLANT
DEVICE RAD COMP TR BAND LRG (VASCULAR PRODUCTS) ×2 IMPLANT
GLIDESHEATH SLEND SS 6F .021 (SHEATH) ×2 IMPLANT
GUIDEWIRE INQWIRE 1.5J.035X260 (WIRE) ×1 IMPLANT
INQWIRE 1.5J .035X260CM (WIRE) ×2
KIT MICROPUNCTURE NIT STIFF (SHEATH) ×2 IMPLANT
PACK CARDIAC CATHETERIZATION (CUSTOM PROCEDURE TRAY) ×2 IMPLANT
SHEATH GLIDE SLENDER 4/5FR (SHEATH) IMPLANT
SHEATH PINNACLE 7F 10CM (SHEATH) ×4 IMPLANT
SLEEVE REPOSITIONING LENGTH 30 (MISCELLANEOUS) ×2 IMPLANT
TRANSDUCER W/STOPCOCK (MISCELLANEOUS) ×2 IMPLANT
WIRE EMERALD 3MM-J .025X260CM (WIRE) ×2 IMPLANT

## 2018-12-06 NOTE — Discharge Instructions (Signed)
Radial Site Care  This sheet gives you information about how to care for yourself after your procedure. Your health care provider may also give you more specific instructions. If you have problems or questions, contact your health care provider. What can I expect after the procedure? After the procedure, it is common to have:  Bruising and tenderness at the catheter insertion area. Follow these instructions at home: Medicines  Take over-the-counter and prescription medicines only as told by your health care provider. Insertion site care  Follow instructions from your health care provider about how to take care of your insertion site. Make sure you: ? Wash your hands with soap and water before you change your bandage (dressing). If soap and water are not available, use hand sanitizer. ? Change your dressing as told by your health care provider. ? Leave stitches (sutures), skin glue, or adhesive strips in place. These skin closures may need to stay in place for 2 weeks or longer. If adhesive strip edges start to loosen and curl up, you may trim the loose edges. Do not remove adhesive strips completely unless your health care provider tells you to do that.  Check your insertion site every day for signs of infection. Check for: ? Redness, swelling, or pain. ? Fluid or blood. ? Pus or a bad smell. ? Warmth.  Do not take baths, swim, or use a hot tub until your health care provider approves.  You may shower 24-48 hours after the procedure, or as directed by your health care provider. ? Remove the dressing and gently wash the site with plain soap and water. ? Pat the area dry with a clean towel. ? Do not rub the site. That could cause bleeding.  Do not apply powder or lotion to the site. Activity   For 24 hours after the procedure, or as directed by your health care provider: ? Do not flex or bend the affected arm. ? Do not push or pull heavy objects with the affected arm. ? Do not  drive yourself home from the hospital or clinic. You may drive 24 hours after the procedure unless your health care provider tells you not to. ? Do not operate machinery or power tools.  Do not lift anything that is heavier than 10 lb (4.5 kg), or the limit that you are told, until your health care provider says that it is safe.  Ask your health care provider when it is okay to: ? Return to work or school. ? Resume usual physical activities or sports. ? Resume sexual activity. General instructions  If the catheter site starts to bleed, raise your arm and put firm pressure on the site. If the bleeding does not stop, get help right away. This is a medical emergency.  If you went home on the same day as your procedure, a responsible adult should be with you for the first 24 hours after you arrive home.  Keep all follow-up visits as told by your health care provider. This is important. Contact a health care provider if:  You have a fever.  You have redness, swelling, or yellow drainage around your insertion site. Get help right away if:  You have unusual pain at the radial site.  The catheter insertion area swells very fast.  The insertion area is bleeding, and the bleeding does not stop when you hold steady pressure on the area.  Your arm or hand becomes pale, cool, tingly, or numb. These symptoms may represent a serious problem   that is an emergency. Do not wait to see if the symptoms will go away. Get medical help right away. Call your local emergency services (911 in the [place] Do not drive yourself to the hospital. Summary  After the procedure, it is common to have bruising and tenderness at the site.  Follow instructions from your health care provider about how to take care of your radial site wound. Check the wound every day for signs of infection.  Do not lift anything that is heavier than 10 lb (4.5 kg), or the limit that you are told, until your health care provider says  that it is safe. This information is not intended to replace advice given to you by your health care provider. Make sure you discuss any questions you have with your health care provider. Document Released: 05/23/2010 Document Revised: 05/26/2017 Document Reviewed: 05/26/2017 Elsevier Patient Education  2020 Elsevier Inc.  Moderate Conscious Sedation, Adult, Care After These instructions provide you with information about caring for yourself after your procedure. Your health care provider may also give you more specific instructions. Your treatment has been planned according to current medical practices, but problems sometimes occur. Call your health care provider if you have any problems or questions after your procedure. What can I expect after the procedure? After your procedure, it is common:  To feel sleepy for several hours.  To feel clumsy and have poor balance for several hours.  To have poor judgment for several hours.  To vomit if you eat too soon. Follow these instructions at home: For at least 24 hours after the procedure:   Do not: ? Participate in activities where you could fall or become injured. ? Drive. ? Use heavy machinery. ? Drink alcohol. ? Take sleeping pills or medicines that cause drowsiness. ? Make important decisions or sign legal documents. ? Take care of children on your own.  Rest. Eating and drinking  Follow the diet recommended by your health care provider.  If you vomit: ? Drink water, juice, or soup when you can drink without vomiting. ? Make sure you have little or no nausea before eating solid foods. General instructions  Have a responsible adult stay with you until you are awake and alert.  Take over-the-counter and prescription medicines only as told by your health care provider.  If you smoke, do not smoke without supervision.  Keep all follow-up visits as told by your health care provider. This is important. Contact a health care  provider if:  You keep feeling nauseous or you keep vomiting.  You feel light-headed.  You develop a rash.  You have a fever. Get help right away if:  You have trouble breathing. This information is not intended to replace advice given to you by your health care provider. Make sure you discuss any questions you have with your health care provider. Document Released: 02/08/2013 Document Revised: 04/02/2017 Document Reviewed: 08/10/2015 Elsevier Patient Education  2020 Elsevier Inc.  

## 2018-12-06 NOTE — Telephone Encounter (Signed)
Cath today with normal coronaries and stable hemodynamics. Will continue to follow. Any EP recs?

## 2018-12-06 NOTE — Interval H&P Note (Signed)
History and Physical Interval Note:  12/06/2018 1:53 PM  [name]  has presented today for surgery, with the diagnosis of heart failure.  The various methods of treatment have been discussed with the patient and family. After consideration of risks, benefits and other options for treatment, the patient has consented to  Procedure(s): RIGHT/LEFT HEART CATH AND CORONARY ANGIOGRAPHY (N/A) as a surgical intervention.  The patient's history has been reviewed, patient examined, no change in status, stable for surgery.  I have reviewed the patient's chart and labs.  Questions were answered to the patient's satisfaction.    Since previous clinic not patient had episode of VT requiring ICD shock. He has not had coronary angio in recent past. Given worsening HF and recent ICD shock will proceed with coronary angio despite CKD 3-4. He is aware of risks/benefits.   Glori Bickers, MD  1:54 PM     Glori Bickers

## 2018-12-07 ENCOUNTER — Encounter (HOSPITAL_COMMUNITY): Payer: Self-pay | Admitting: Internal Medicine

## 2018-12-07 MED ORDER — AMIODARONE HCL 200 MG PO TABS: 200.0000 mg | ORAL_TABLET | Freq: Two times a day (BID) | ORAL | 1 refills | Status: DC

## 2018-12-07 NOTE — Telephone Encounter (Signed)
Per Dr. Lovena Le, increase amiodarone to 200mg  BID, f/u in 4 weeks. Advised pt of medication instructions and advised to contact PCP office to see if he'll need a coumadin clinic visit sooner than 9/1 as they currently follow his INRs. Pt agreeable to an appointment with Dr. Lovena Le on 12/29/18 at 4:15pm. Pt verbalizes understanding of all instructions, med list updated with current order. No further questions at this time.

## 2018-12-08 ENCOUNTER — Other Ambulatory Visit: Payer: Self-pay | Admitting: Internal Medicine

## 2018-12-08 ENCOUNTER — Telehealth: Payer: Self-pay | Admitting: Internal Medicine

## 2018-12-08 NOTE — Telephone Encounter (Signed)
Patient states Dr. Lovena Le has put him on amiodarone 200mg  twice a day starting 8/5.  They have advised patient to reach out to [facility] to keep a check on his Coum levels.  I have moved his appointment up to 8/14.  Please follow up with patient if he needs to be seen sooner.

## 2018-12-09 ENCOUNTER — Other Ambulatory Visit: Payer: Self-pay | Admitting: Internal Medicine

## 2018-12-09 NOTE — Telephone Encounter (Signed)
Noted.  8/14 is OK for appointment.

## 2018-12-12 ENCOUNTER — Other Ambulatory Visit: Payer: Self-pay | Admitting: Internal Medicine

## 2018-12-12 DIAGNOSIS — I472 Ventricular tachycardia, unspecified: Secondary | ICD-10-CM

## 2018-12-13 ENCOUNTER — Other Ambulatory Visit: Payer: Self-pay

## 2018-12-13 ENCOUNTER — Ambulatory Visit (INDEPENDENT_AMBULATORY_CARE_PROVIDER_SITE_OTHER): Payer: Medicare Other | Admitting: *Deleted

## 2018-12-13 ENCOUNTER — Ambulatory Visit (INDEPENDENT_AMBULATORY_CARE_PROVIDER_SITE_OTHER): Payer: Medicare Other | Admitting: General Practice

## 2018-12-13 DIAGNOSIS — I472 Ventricular tachycardia, unspecified: Secondary | ICD-10-CM

## 2018-12-13 DIAGNOSIS — I4891 Unspecified atrial fibrillation: Secondary | ICD-10-CM

## 2018-12-13 DIAGNOSIS — Z8679 Personal history of other diseases of the circulatory system: Secondary | ICD-10-CM

## 2018-12-13 DIAGNOSIS — Z7901 Long term (current) use of anticoagulants: Secondary | ICD-10-CM

## 2018-12-13 LAB — CUP PACEART REMOTE DEVICE CHECK
Battery Remaining Longevity: 61 mo
Battery Remaining Percentage: 60 %
Battery Voltage: 2.96 V
Brady Statistic RV Percent Paced: 1 %
Date Time Interrogation Session: 20200811080024
HighPow Impedance: 48 Ohm
HighPow Impedance: 48 Ohm
Implantable Lead Implant Date: 20070817
Implantable Lead Location: 753860
Implantable Lead Model: 7001
Implantable Pulse Generator Implant Date: 20150914
Lead Channel Impedance Value: 380 Ohm
Lead Channel Pacing Threshold Amplitude: 1.25 V
Lead Channel Pacing Threshold Pulse Width: 0.7 ms
Lead Channel Sensing Intrinsic Amplitude: 11.6 mV
Lead Channel Setting Pacing Amplitude: 2.5 V
Lead Channel Setting Pacing Pulse Width: 0.7 ms
Lead Channel Setting Sensing Sensitivity: 0.5 mV
Pulse Gen Serial Number: 7214340

## 2018-12-13 LAB — POCT INR: INR: 1.9 — AB (ref 2.0–3.0)

## 2018-12-13 NOTE — Patient Instructions (Signed)
Pre visit review using our clinic review tool, if applicable. No additional management support is needed unless otherwise documented below in the visit note. 

## 2018-12-16 ENCOUNTER — Ambulatory Visit: Payer: Medicare Other

## 2018-12-20 ENCOUNTER — Other Ambulatory Visit: Payer: Self-pay

## 2018-12-20 ENCOUNTER — Ambulatory Visit (INDEPENDENT_AMBULATORY_CARE_PROVIDER_SITE_OTHER): Payer: Medicare Other | Admitting: General Practice

## 2018-12-20 DIAGNOSIS — Z7901 Long term (current) use of anticoagulants: Secondary | ICD-10-CM | POA: Diagnosis not present

## 2018-12-20 DIAGNOSIS — Z8679 Personal history of other diseases of the circulatory system: Secondary | ICD-10-CM

## 2018-12-20 DIAGNOSIS — I4891 Unspecified atrial fibrillation: Secondary | ICD-10-CM

## 2018-12-20 LAB — POCT INR: INR: 2.4 (ref 2.0–3.0)

## 2018-12-20 NOTE — Patient Instructions (Signed)
Pre visit review using our clinic review tool, if applicable. No additional management support is needed unless otherwise documented below in the visit note.  Continue to take 1 tablet daily except 1 1/2 tablets on Monday and Wed and Fridays.  Re-check in 2 week due to increase in amiodarone.

## 2018-12-21 ENCOUNTER — Encounter: Payer: Self-pay | Admitting: Cardiology

## 2018-12-21 DIAGNOSIS — I129 Hypertensive chronic kidney disease with stage 1 through stage 4 chronic kidney disease, or unspecified chronic kidney disease: Secondary | ICD-10-CM | POA: Diagnosis not present

## 2018-12-21 DIAGNOSIS — I5040 Unspecified combined systolic (congestive) and diastolic (congestive) heart failure: Secondary | ICD-10-CM | POA: Diagnosis not present

## 2018-12-21 DIAGNOSIS — D631 Anemia in chronic kidney disease: Secondary | ICD-10-CM | POA: Diagnosis not present

## 2018-12-21 DIAGNOSIS — I480 Paroxysmal atrial fibrillation: Secondary | ICD-10-CM | POA: Diagnosis not present

## 2018-12-21 DIAGNOSIS — N183 Chronic kidney disease, stage 3 (moderate): Secondary | ICD-10-CM | POA: Diagnosis not present

## 2018-12-21 DIAGNOSIS — E1122 Type 2 diabetes mellitus with diabetic chronic kidney disease: Secondary | ICD-10-CM | POA: Diagnosis not present

## 2018-12-21 NOTE — Progress Notes (Signed)
Remote ICD transmission.   

## 2018-12-25 DIAGNOSIS — I5042 Chronic combined systolic (congestive) and diastolic (congestive) heart failure: Secondary | ICD-10-CM | POA: Diagnosis not present

## 2018-12-25 DIAGNOSIS — I270 Primary pulmonary hypertension: Secondary | ICD-10-CM | POA: Diagnosis not present

## 2018-12-25 DIAGNOSIS — J9611 Chronic respiratory failure with hypoxia: Secondary | ICD-10-CM | POA: Diagnosis not present

## 2018-12-29 ENCOUNTER — Encounter: Payer: Self-pay | Admitting: Internal Medicine

## 2018-12-29 ENCOUNTER — Ambulatory Visit (INDEPENDENT_AMBULATORY_CARE_PROVIDER_SITE_OTHER): Payer: Medicare Other | Admitting: Internal Medicine

## 2018-12-29 ENCOUNTER — Other Ambulatory Visit: Payer: Self-pay

## 2018-12-29 VITALS — BP 96/70 | HR 76 | Ht 70.0 in | Wt 257.2 lb

## 2018-12-29 DIAGNOSIS — I472 Ventricular tachycardia, unspecified: Secondary | ICD-10-CM

## 2018-12-29 DIAGNOSIS — Z95810 Presence of automatic (implantable) cardiac defibrillator: Secondary | ICD-10-CM

## 2018-12-29 DIAGNOSIS — I428 Other cardiomyopathies: Secondary | ICD-10-CM

## 2018-12-29 MED ORDER — AMIODARONE HCL 200 MG PO TABS: 200.0000 mg | ORAL_TABLET | Freq: Every day | ORAL | 3 refills | Status: DC

## 2018-12-29 NOTE — Progress Notes (Signed)
HPI Mr. Dollins returns today for followup of VT, chronic systolic heart failure, dyslipidemia and a non-ischemic CM. The patient has had recurrent VT. He underwent left and right heart cath 3 weeks ago where he was found to have no significant CAD, severe LV dysfunction, and moderate pulmonary HTN. He has been stable since his VT 4 weeks ago. No Known Allergies   Current Outpatient Medications  Medication Sig Dispense Refill  . acetaminophen (TYLENOL) 500 MG tablet Take 500-1,000 mg by mouth every 6 (six) hours as needed (for bilateral knee pain).    [name] allopurinol (ZYLOPRIM) 300 MG tablet TAKE 1 TABLET BY MOUTH EVERY DAY 90 tablet 1  . amiodarone (PACERONE) 200 MG tablet Take 200 mg by mouth 2 (two) times daily.    [name] aspirin EC 81 MG EC tablet Take 1 tablet (81 mg total) by mouth daily. 30 tablet 0  . atorvastatin (LIPITOR) 80 MG tablet TAKE 1 TABLET BY MOUTH EVERYDAY AT BEDTIME 90 tablet 1  . carvedilol (COREG) 25 MG tablet Take 0.5 tablets (12.5 mg total) by mouth 2 (two) times daily with a meal. 90 tablet 3  . cholecalciferol (VITAMIN D) 1000 units tablet Take 1,000 Units by mouth daily.    . digoxin (LANOXIN) 0.125 MG tablet Take 1 tablet (0.125 mg total) by mouth every other day. 45 tablet 3  . GLUCOMANNAN PO Take 665 mg by mouth daily as needed (cleansing).     . Insulin Glargine (LANTUS SOLOSTAR) 100 UNIT/ML Solostar Pen INJECT 10 UNITS INTO THE SKIN AT BEDTIME. 15 pen 2  . Insulin Pen Needle (B-D ULTRAFINE III SHORT PEN) 31G X 8 MM MISC USE ONCE DAILY WITH INSULIN 100 each 3  . Insulin Pen Needle (PEN NEEDLES) 31G X 5 MM MISC 100 each by Does not apply route 2 (two) times daily. 100 each 5  . isosorbide-hydrALAZINE (BIDIL) 20-37.5 MG tablet Take 2 tablets by mouth 3 (three) times daily. 180 tablet 12  . LANTUS SOLOSTAR 100 UNIT/ML Solostar Pen INJECT 10 UNITS INTO THE SKIN AT BEDTIME. 15 pen 3  . nitroGLYCERIN (NITROSTAT) 0.4 MG SL tablet PLACE 1 TABLET UNDER TONGUE EVERY 5 MINS  X3 DOSES AS NEEDED FOR CHEST PAIN 25 tablet 11  . OXYGEN Inhale into the lungs. 2L continuous; 3L on pulse machine    . potassium chloride SA (K-DUR,KLOR-CON) 20 MEQ tablet Take 1 tablet (20 mEq total) by mouth daily. 90 tablet 3  . spironolactone (ALDACTONE) 25 MG tablet Take 1 tablet (25 mg total) by mouth daily. 90 tablet 3  . torsemide (DEMADEX) 20 MG tablet Take 20-40 mg by mouth See admin instructions. Take 40 mg by mouth in the morning, 20 mg at lunch, 20 mg in the PM with add'l 20 mg as needed for bloating    . traMADol (ULTRAM) 50 MG tablet Take 1 tablet (50 mg total) by mouth every 8 (eight) hours as needed. 60 tablet 2  . Turmeric 500 MG CAPS Take 1,000 mg by mouth daily as needed (inflammation).     . warfarin (COUMADIN) 5 MG tablet Take 1 tablet daily except take 1 1/2 tablets on Mon Wed and Fri or Take as directed by Coumadin clinic. (Patient taking differently: Take 5-7.5 mg by mouth See admin instructions. Take 7.5 mg by mouth on Monday, Wednesday and Friday and take 5 mg on Tuesday, Thursday, Saturday and Sunday) 120 tablet 1   No current facility-administered medications for this visit.  Past Medical History:  Diagnosis Date  . CAD (coronary artery disease)    a. Initial nonobst by cath 2009. b. Cath 01/2013 in setting of VT storm: obstructive distal Cx disease (small and terminates in the AV groove, unlikely to cause significant ischemia or electrical instability), nonobstructive RCA disease, EF 15-20%.   . Cerebrovascular accident [facility])    a. 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 Chronic systolic CHF (congestive heart failure) ([place])    a. Likely NICM (out of proportion to CAD). b. 2009 - EF 25-30% by echo, 01/2013: 15-20% by cath. c. 11/2014 Echo: EF 35-40%, Gr1 DD, mild MR, mod TR, PASP 21mmHg.  . CKD (chronic kidney disease), stage II   . Dyslipidemia   . Gout   . HTN (hypertension)   . Hypokalemia   . ICD (implantable cardiac defibrillator) in place   . Insulin  dependent diabetes mellitus ([place])   . Lipoma   . Nonischemic cardiomyopathy ([place])    a.  11/2014 Echo: EF 35-40%, Gr1 DD, mild MR, mod TR, PASP 97mmHg.  . OSA (obstructive sleep apnea)    does not wear cpap  . PAF (paroxysmal atrial fibrillation) ([place])    a. Noted 05/2008 by EKG;  b. CHA2DS2VASc = 5-6-->coumadin.  . Paroxysmal VT ([place])    a. s/p St. Jude ICD 2007. b. H/o paroxysmal VT/VF including VT storm 12/2012 admission prompting amio initiation;  c. 01/2013 ICD upgrade SJM [id number] Ellipse VR single lead ICD.  [name] Pulmonary HTN ([place])    a. Mild by cath 01/2013.    ROS:   All systems reviewed and negative except as noted in the HPI.   Past Surgical History:  Procedure Laterality Date  . CARDIAC CATHETERIZATION     Nonobstructive coronary disease 2009  . CARDIAC DEFIBRILLATOR PLACEMENT     ICD-St. Jude  . IMPLANTABLE CARDIOVERTER DEFIBRILLATOR (ICD) GENERATOR CHANGE N/A 01/15/2014   Procedure: ICD GENERATOR CHANGE;  Surgeon: Evans Lance, MD;  Location: [facility] CATH LAB;  Service: Cardiovascular;  Laterality: N/A;  . LEFT AND RIGHT HEART CATHETERIZATION WITH CORONARY ANGIOGRAM N/A 01/03/2013   Procedure: LEFT AND RIGHT HEART CATHETERIZATION WITH CORONARY ANGIOGRAM;  Surgeon: Peter M Martinique, MD;  Location[facility] CATH LAB;  Service: Cardiovascular;  Laterality: N/A;  . LIPOMA EXCISION    . RIGHT/LEFT HEART CATH AND CORONARY ANGIOGRAPHY N/A 10/04/2017   Procedure: RIGHT/LEFT HEART CATH AND CORONARY ANGIOGRAPHY;  Surgeon: Larey Dresser, MD;  Location: [place] CV LAB;  Service: Cardiovascular;  Laterality: N/A;  . RIGHT/LEFT HEART CATH AND CORONARY ANGIOGRAPHY N/A 12/06/2018   Procedure: RIGHT/LEFT HEART CATH AND CORONARY ANGIOGRAPHY;  Surgeon: Jolaine Artist, MD;  Location: [place] CV LAB;  Service: Cardiovascular;  Laterality: N/A;     Family History  Problem Relation Age of Onset  . Coronary artery disease Mother   . Heart attack Mother   . Heart attack Maternal Uncle   . Heart  attack Maternal Grandmother      Social History   Socioeconomic History  . Marital status: Married    Spouse name: Not on file  . Number of children: 2  . Years of education: Not on file  . Highest education level: Not on file  Occupational History  . Occupation: medically retired due to heart failure  Social Needs  . Financial resource strain: Not on file  . Food insecurity    Worry: Not on file    Inability: Not on file  . Transportation needs  Medical: Yes    Non-medical: No  Tobacco Use  . Smoking status: Former Smoker    Quit date: 05/04/1986    Years since quitting: 32.6  . Smokeless tobacco: Never Used  . Tobacco comment: Quit smoking 30 yrs ago. Smoked as teenager less than 1/2 ppd. Smoked x 4 years.  Substance and Sexual Activity  . Alcohol use: No    Comment: occasionally  . Drug use: No  . Sexual activity: Never  Lifestyle  . Physical activity    Days per week: 0 days    Minutes per session: 0 min  . Stress: Not at all  Relationships  . Social Herbalist on phone: Not on file    Gets together: Not on file    Attends religious service: Not on file    Active member of club or organization: Not on file    Attends meetings of clubs or organizations: Not on file    Relationship status: Not on file  . Intimate partner violence    Fear of current or ex partner: Not on file    Emotionally abused: Not on file    Physically abused: Not on file    Forced sexual activity: Not on file  Other Topics Concern  . Not on file  Social History Narrative  . Not on file     BP 96/70   Pulse 76   Ht 5\' 10"  (1.778 m)   Wt 257 lb 3.2 oz (116.7 kg)   SpO2 (!) 89%   BMI 36.90 kg/m   Physical Exam:  Well appearing NAD HEENT: Unremarkable Neck:  No JVD, no thyromegally Lymphatics:  No adenopathy Back:  No CVA tenderness Lungs:  Clear with minimal basilar rales.  HEART:  Regular rate rhythm, no murmurs, no rubs, no clicks Abd:  soft, positive bowel  sounds, no organomegally, no rebound, no guarding Ext:  2 plus pulses, no edema, no cyanosis, no clubbing Skin:  No rashes no nodules Neuro:  CN II through XII intact, motor grossly intact  EKG - atypical atrial flutter with 2:1 AV conduction  DEVICE  Normal device function.  See PaceArt for details.   Assess/Plan: 1. VT - he has not had any additional VT since uptitration of his dose of amiodarone. I have asked him to take additional amio (200 bid) until 10/1.  2. Atypical atrial flutter - His rate has slowed down on amiodarone. Hopefully he will self terminate. We cannot pace out as we do not have an atrial lead. He will continue is current meds. Continue warfarin. 3. Chronic systolic heart failure - his symptoms are class 3. He will continue his current meds and maintain a low sodium diet. 4. ICD - his St. Jude single chamber ICD is working normally. We will recheck in several months.  Mikle Bosworth.D.

## 2018-12-29 NOTE — Patient Instructions (Addendum)
Medication Instructions:  Your physician has recommended you make the following change in your medication:   1.  Continue amiodarone 200 mg one tablet by mouth twice a day until February 02, 2019  2.  On February 02, 2019 reduce amiodarone to ONE tablet by mouth ONCE a day.  Labwork: None ordered.  Testing/Procedures: None ordered.  Follow-Up: Your physician wants you to follow-up in:  November with Dr. Lovena Le.     Remote monitoring is used to monitor your ICD from home. This monitoring reduces the number of office visits required to check your device to one time per year. It allows [place] to keep an eye on the functioning of your device to ensure it is working properly. You are scheduled for a device check from home on 03/14/2019. You may send your transmission at any time that day. If you have a wireless device, the transmission will be sent automatically. After your physician reviews your transmission, you will receive a postcard with your next transmission date.  Any Other Special Instructions Will Be Listed Below (If Applicable).  If you need a refill on your cardiac medications before your next appointment, please call your pharmacy.

## 2019-01-01 DIAGNOSIS — G4733 Obstructive sleep apnea (adult) (pediatric): Secondary | ICD-10-CM | POA: Diagnosis not present

## 2019-01-01 DIAGNOSIS — I5043 Acute on chronic combined systolic (congestive) and diastolic (congestive) heart failure: Secondary | ICD-10-CM | POA: Diagnosis not present

## 2019-01-01 DIAGNOSIS — R0902 Hypoxemia: Secondary | ICD-10-CM | POA: Diagnosis not present

## 2019-01-01 DIAGNOSIS — I509 Heart failure, unspecified: Secondary | ICD-10-CM | POA: Diagnosis not present

## 2019-01-02 ENCOUNTER — Inpatient Hospital Stay (HOSPITAL_COMMUNITY)
Admission: EM | Admit: 2019-01-02 | Discharge: 2019-01-05 | DRG: 291 | Disposition: A | Discharge status: Home / Self Care | Payer: Medicare Other | Attending: Internal Medicine | Admitting: Internal Medicine

## 2019-01-02 ENCOUNTER — Other Ambulatory Visit: Payer: Self-pay

## 2019-01-02 ENCOUNTER — Emergency Department (HOSPITAL_COMMUNITY): Payer: Medicare Other

## 2019-01-02 ENCOUNTER — Encounter (HOSPITAL_COMMUNITY): Payer: Self-pay

## 2019-01-02 DIAGNOSIS — D631 Anemia in chronic kidney disease: Secondary | ICD-10-CM | POA: Diagnosis not present

## 2019-01-02 DIAGNOSIS — N179 Acute kidney failure, unspecified: Secondary | ICD-10-CM

## 2019-01-02 DIAGNOSIS — N184 Chronic kidney disease, stage 4 (severe): Secondary | ICD-10-CM | POA: Diagnosis not present

## 2019-01-02 DIAGNOSIS — I4892 Unspecified atrial flutter: Secondary | ICD-10-CM | POA: Diagnosis not present

## 2019-01-02 DIAGNOSIS — E119 Type 2 diabetes mellitus without complications: Secondary | ICD-10-CM

## 2019-01-02 DIAGNOSIS — Z794 Long term (current) use of insulin: Secondary | ICD-10-CM

## 2019-01-02 DIAGNOSIS — J9621 Acute and chronic respiratory failure with hypoxia: Secondary | ICD-10-CM | POA: Diagnosis not present

## 2019-01-02 DIAGNOSIS — Z20828 Contact with and (suspected) exposure to other viral communicable diseases: Secondary | ICD-10-CM | POA: Diagnosis present

## 2019-01-02 DIAGNOSIS — I428 Other cardiomyopathies: Secondary | ICD-10-CM | POA: Diagnosis present

## 2019-01-02 DIAGNOSIS — I25110 Atherosclerotic heart disease of native coronary artery with unstable angina pectoris: Secondary | ICD-10-CM | POA: Diagnosis present

## 2019-01-02 DIAGNOSIS — Z9981 Dependence on supplemental oxygen: Secondary | ICD-10-CM

## 2019-01-02 DIAGNOSIS — I248 Other forms of acute ischemic heart disease: Secondary | ICD-10-CM | POA: Diagnosis present

## 2019-01-02 DIAGNOSIS — R079 Chest pain, unspecified: Secondary | ICD-10-CM | POA: Diagnosis not present

## 2019-01-02 DIAGNOSIS — Z7901 Long term (current) use of anticoagulants: Secondary | ICD-10-CM | POA: Diagnosis not present

## 2019-01-02 DIAGNOSIS — I2721 Secondary pulmonary arterial hypertension: Secondary | ICD-10-CM | POA: Diagnosis present

## 2019-01-02 DIAGNOSIS — I5023 Acute on chronic systolic (congestive) heart failure: Secondary | ICD-10-CM | POA: Diagnosis present

## 2019-01-02 DIAGNOSIS — R0602 Shortness of breath: Secondary | ICD-10-CM | POA: Diagnosis not present

## 2019-01-02 DIAGNOSIS — I130 Hypertensive heart and chronic kidney disease with heart failure and stage 1 through stage 4 chronic kidney disease, or unspecified chronic kidney disease: Principal | ICD-10-CM | POA: Diagnosis present

## 2019-01-02 DIAGNOSIS — I5020 Unspecified systolic (congestive) heart failure: Secondary | ICD-10-CM | POA: Diagnosis present

## 2019-01-02 DIAGNOSIS — Z7982 Long term (current) use of aspirin: Secondary | ICD-10-CM | POA: Diagnosis not present

## 2019-01-02 DIAGNOSIS — I5043 Acute on chronic combined systolic (congestive) and diastolic (congestive) heart failure: Secondary | ICD-10-CM | POA: Diagnosis not present

## 2019-01-02 DIAGNOSIS — E785 Hyperlipidemia, unspecified: Secondary | ICD-10-CM | POA: Diagnosis not present

## 2019-01-02 DIAGNOSIS — Z95810 Presence of automatic (implantable) cardiac defibrillator: Secondary | ICD-10-CM | POA: Diagnosis not present

## 2019-01-02 DIAGNOSIS — I480 Paroxysmal atrial fibrillation: Secondary | ICD-10-CM | POA: Diagnosis not present

## 2019-01-02 DIAGNOSIS — E1122 Type 2 diabetes mellitus with diabetic chronic kidney disease: Secondary | ICD-10-CM | POA: Diagnosis not present

## 2019-01-02 DIAGNOSIS — N183 Chronic kidney disease, stage 3 (moderate): Secondary | ICD-10-CM

## 2019-01-02 DIAGNOSIS — G4733 Obstructive sleep apnea (adult) (pediatric): Secondary | ICD-10-CM | POA: Diagnosis present

## 2019-01-02 DIAGNOSIS — Z79899 Other long term (current) drug therapy: Secondary | ICD-10-CM | POA: Diagnosis not present

## 2019-01-02 DIAGNOSIS — Z8673 Personal history of transient ischemic attack (TIA), and cerebral infarction without residual deficits: Secondary | ICD-10-CM

## 2019-01-02 DIAGNOSIS — I10 Essential (primary) hypertension: Secondary | ICD-10-CM | POA: Diagnosis present

## 2019-01-02 DIAGNOSIS — N189 Chronic kidney disease, unspecified: Secondary | ICD-10-CM | POA: Diagnosis present

## 2019-01-02 DIAGNOSIS — R7989 Other specified abnormal findings of blood chemistry: Secondary | ICD-10-CM | POA: Diagnosis not present

## 2019-01-02 DIAGNOSIS — D539 Nutritional anemia, unspecified: Secondary | ICD-10-CM | POA: Diagnosis not present

## 2019-01-02 DIAGNOSIS — Z87891 Personal history of nicotine dependence: Secondary | ICD-10-CM

## 2019-01-02 LAB — TROPONIN I (HIGH SENSITIVITY)
Troponin I (High Sensitivity): 94 ng/L — ABNORMAL HIGH (ref <18)
Troponin I (High Sensitivity): 93 ng/L — ABNORMAL HIGH (ref <18)

## 2019-01-02 LAB — URINALYSIS, ROUTINE W REFLEX MICROSCOPIC
Bilirubin Urine: NEGATIVE
Glucose, UA: NEGATIVE mg/dL
Hgb urine dipstick: NEGATIVE
Ketones, ur: NEGATIVE mg/dL
Leukocytes,Ua: NEGATIVE
Nitrite: NEGATIVE
Protein, ur: NEGATIVE mg/dL
Specific Gravity, Urine: 1.01 (ref 1.005–1.030)
pH: 5 (ref 5.0–8.0)

## 2019-01-02 LAB — BASIC METABOLIC PANEL
Anion gap: 13 (ref 5–15)
BUN: 65 mg/dL — ABNORMAL HIGH (ref 8–23)
CO2: 24 mmol/L (ref 22–32)
Calcium: 9.5 mg/dL (ref 8.9–10.3)
Chloride: 100 mmol/L (ref 98–111)
Creatinine, Ser: 3.65 mg/dL — ABNORMAL HIGH (ref 0.61–1.24)
GFR calc Af Amer: 19 mL/min — ABNORMAL LOW (ref >60)
GFR calc non Af Amer: 17 mL/min — ABNORMAL LOW (ref >60)
Glucose, Bld: 156 mg/dL — ABNORMAL HIGH (ref 70–99)
Potassium: 4.4 mmol/L (ref 3.5–5.1)
Sodium: 137 mmol/L (ref 135–145)

## 2019-01-02 LAB — GLUCOSE, CAPILLARY: Glucose-Capillary: 161 mg/dL — ABNORMAL HIGH (ref 70–99)

## 2019-01-02 LAB — CBC
HCT: 39.5 % (ref 39.0–52.0)
Hemoglobin: 12.5 g/dL — ABNORMAL LOW (ref 13.0–17.0)
MCH: 31.7 pg (ref 26.0–34.0)
MCHC: 31.6 g/dL (ref 30.0–36.0)
MCV: 100.3 fL — ABNORMAL HIGH (ref 80.0–100.0)
Platelets: 190 10*3/uL (ref 150–400)
RBC: 3.94 MIL/uL — ABNORMAL LOW (ref 4.22–5.81)
RDW: 17.1 % — ABNORMAL HIGH (ref 11.5–15.5)
WBC: 6.6 10*3/uL (ref 4.0–10.5)
nRBC: 0.3 % — ABNORMAL HIGH (ref 0.0–0.2)

## 2019-01-02 LAB — PROTIME-INR
INR: 2.5 — ABNORMAL HIGH (ref 0.8–1.2)
Prothrombin Time: 26.6 seconds — ABNORMAL HIGH (ref 11.4–15.2)

## 2019-01-02 LAB — CREATININE, URINE, RANDOM: Creatinine, Urine: 90.43 mg/dL

## 2019-01-02 LAB — SARS CORONAVIRUS 2 BY RT PCR (HOSPITAL ORDER, PERFORMED IN ~~LOC~~ HOSPITAL LAB): SARS Coronavirus 2: NEGATIVE

## 2019-01-02 LAB — BRAIN NATRIURETIC PEPTIDE: B Natriuretic Peptide: 2154.7 pg/mL — ABNORMAL HIGH (ref 0.0–100.0)

## 2019-01-02 LAB — DIGOXIN LEVEL: Digoxin Level: 0.7 ng/mL — ABNORMAL LOW (ref 0.8–2.0)

## 2019-01-02 LAB — SODIUM, URINE, RANDOM: Sodium, Ur: 38 mmol/L

## 2019-01-02 MED ORDER — ONDANSETRON HCL 4 MG/2ML IJ SOLN: 4.0000 mg | Freq: Four times a day (QID) | INTRAMUSCULAR | Status: DC | PRN

## 2019-01-02 MED ORDER — FUROSEMIDE 10 MG/ML IJ SOLN: 80.0000 mg | Freq: Two times a day (BID) | INTRAMUSCULAR | Status: DC

## 2019-01-02 MED ORDER — SODIUM CHLORIDE 0.9% FLUSH
3.0000 mL | Freq: Two times a day (BID) | INTRAVENOUS | Status: DC
  Administered 2019-01-02 – 2019-01-05 (×5): 3 mL via INTRAVENOUS

## 2019-01-02 MED ORDER — CARVEDILOL 12.5 MG PO TABS: 12.5000 mg | ORAL_TABLET | Freq: Two times a day (BID) | ORAL | Status: DC

## 2019-01-02 MED ORDER — ATORVASTATIN CALCIUM 80 MG PO TABS
80.0000 mg | ORAL_TABLET | Freq: Every day | ORAL | Status: DC
  Administered 2019-01-02 – 2019-01-04 (×3): 80 mg via ORAL
  Filled 2019-01-02 (×3): qty 1

## 2019-01-02 MED ORDER — CARVEDILOL 6.25 MG PO TABS
6.2500 mg | ORAL_TABLET | Freq: Two times a day (BID) | ORAL | Status: DC
  Administered 2019-01-02 – 2019-01-05 (×6): 6.25 mg via ORAL
  Filled 2019-01-02 (×6): qty 1

## 2019-01-02 MED ORDER — AMIODARONE HCL 200 MG PO TABS
200.0000 mg | ORAL_TABLET | Freq: Two times a day (BID) | ORAL | Status: DC
  Administered 2019-01-02 – 2019-01-05 (×6): 200 mg via ORAL
  Filled 2019-01-02 (×6): qty 1

## 2019-01-02 MED ORDER — HEPARIN SODIUM (PORCINE) 5000 UNIT/ML IJ SOLN
5000.0000 [IU] | Freq: Three times a day (TID) | INTRAMUSCULAR | Status: DC
  Administered 2019-01-02 – 2019-01-03 (×2): 5000 [IU] via SUBCUTANEOUS
  Filled 2019-01-02 (×2): qty 1

## 2019-01-02 MED ORDER — NITROGLYCERIN 0.4 MG SL SUBL: 0.4000 mg | SUBLINGUAL_TABLET | SUBLINGUAL | Status: DC | PRN

## 2019-01-02 MED ORDER — ACETAMINOPHEN 325 MG PO TABS: 650.0000 mg | ORAL_TABLET | ORAL | Status: DC | PRN

## 2019-01-02 MED ORDER — SODIUM CHLORIDE 0.9 % IV SOLN
250.0000 mL | INTRAVENOUS | Status: DC | PRN
  Administered 2019-01-03: 13:00:00 250 mL via INTRAVENOUS

## 2019-01-02 MED ORDER — SODIUM CHLORIDE 0.9% FLUSH: 3.0000 mL | Freq: Once | INTRAVENOUS | Status: DC

## 2019-01-02 MED ORDER — DIGOXIN 125 MCG PO TABS
0.1250 mg | ORAL_TABLET | ORAL | Status: DC
  Administered 2019-01-03: 0.125 mg via ORAL
  Filled 2019-01-02 (×2): qty 1

## 2019-01-02 MED ORDER — INSULIN ASPART 100 UNIT/ML ~~LOC~~ SOLN
0.0000 [IU] | Freq: Three times a day (TID) | SUBCUTANEOUS | Status: DC
  Administered 2019-01-03 – 2019-01-04 (×2): 1 [IU] via SUBCUTANEOUS

## 2019-01-02 MED ORDER — WARFARIN SODIUM 7.5 MG PO TABS
7.5000 mg | ORAL_TABLET | Freq: Once | ORAL | Status: AC
  Administered 2019-01-02: 7.5 mg via ORAL
  Filled 2019-01-02: qty 1

## 2019-01-02 MED ORDER — WARFARIN - PHARMACIST DOSING INPATIENT: Freq: Every day | Status: DC

## 2019-01-02 MED ORDER — ISOSORB DINITRATE-HYDRALAZINE 20-37.5 MG PO TABS: 2.0000 | ORAL_TABLET | Freq: Three times a day (TID) | ORAL | Status: DC

## 2019-01-02 MED ORDER — FUROSEMIDE 10 MG/ML IJ SOLN
80.0000 mg | Freq: Two times a day (BID) | INTRAMUSCULAR | Status: DC
  Administered 2019-01-02: 22:00:00 80 mg via INTRAVENOUS
  Filled 2019-01-02: qty 8

## 2019-01-02 MED ORDER — INSULIN GLARGINE 100 UNIT/ML ~~LOC~~ SOLN: 10.0000 [IU] | Freq: Every day | SUBCUTANEOUS | Status: DC

## 2019-01-02 MED ORDER — SODIUM CHLORIDE 0.9% FLUSH: 3.0000 mL | INTRAVENOUS | Status: DC | PRN

## 2019-01-02 MED ORDER — INSULIN GLARGINE 100 UNIT/ML SOLOSTAR PEN: 10.0000 [IU] | PEN_INJECTOR | Freq: Every day | SUBCUTANEOUS | Status: DC

## 2019-01-02 NOTE — H&P (View-Only) (Signed)
Cardiology Consultation:   Patient ID: RIOT WATERWORTH; [id number]; Sep 12, 1955   Admit date: 01/02/2019 Date of Consult: 01/02/2019  Primary Care Provider: Biagio Borg, MD Primary Cardiologist: Dr. Aundra Dubin, MD/Dr. Haroldine Laws, MD  Patient Profile:   [name] is a 63 y.o. male with a hx of longstandingcardiomyopathy out of proportion to CAD/chronic systolic CHF(EF 08-65% 11/8467), prior VT,s/p St Jude ICD, CAD,CVA,OSA on CPAP, pulmonary HTN on O2 at home, PAFon coumadin,HTN,and CKD stage 3.who is being seen today for the evaluation of fluid volume overload at the request of Dr. Tamala Julian.  History of Present Illness:   Mr. Longest is a 63yo M with a hx as stated above who presented to [facility] on 01/02/2019 with progressively worsening shortness of breath since his cardiac catheterization 12/06/2018.  He reports mild shortness of breath at rest however has had progressive, worsening exertional dyspnea.  Prior to his cath, he states he was able to ambulate within his home without much complication but this has significantly worsened since in the last several weeks. Additionally, he reports an episode of chest pressure/pain which resolved with sublingual nitroglycerin.  He had no associated symptoms such as diaphoresis, nausea or vomiting.  He has chronic LE swelling and orthopnea symptoms which he states have been relatively unchanged.  He reports no significant weight increase or significant changes in his diet.  He was last seen by Dr. Lovena Le in the office with a weight of 257lb however states his typical weights range in the 252-254 range.  [name] was 9lb when last seen by Dr. Haroldine Laws 11/17/2018. He was recently told to stop taking his evening dose of torsemide 20 mg in the evening by his nephrologist given his worsening creatinine.  He continued with torsemide 40 mg in the a.m. and 20 mg in the afternoon. His most recent baseline creatinine appears to be in the 1.9- 2.3 range.   [name] was  initially seen and admitted from Dr. Rosezella Florida office 09/2017 with fluid volume overload. Heart failure team was consulted. He underwent a right and left heart cath 10/04/2017. PYP scan and myeloma panel completed that was not suggestive of cardiac amyloidosis.  He diuresed 31 pounds with IV Lasix and metolazone was then transitioned to torsemide 60 mg daily.  Heart failure medicines were optimized.  No ACE/ARB were initiated secondary to CKD and soft BPs.  His discharge weight was 269 pounds.  Since that time he has had multiple hospital admissions for CHF.  He was last seen by Dr. Haroldine Laws 11/17/2018 in the outpatient setting.  His diuretic regimen at that time was torsemide 40 mg in a.m., 20 mg in the afternoon and 20 mg in the evening. He had no complaints of edema, orthopnea or PND. His ICD was interrogated with no episodes of VT/VF.  Thoracic impedance was below May/July but was noted to be improved. His last echocardiogram was 11/17/2018 with an LVEF of 20 to 25%. He was scheduled for an outpatient right and left heart cardiac catheterization performed on 12/06/2018.  LHC showed mid to distal LCx lesion at 50%, pRCA at 20% and mLAD at 30%.  Severe nonischemic cardiomyopathy with an LVEF of 10 to 15% per echocardiogram with severe biventricular dysfunction with moderate PAH and normal left-sided pressures.  Was then seen by Dr. Lovena Le 12/29/2018 for follow-up of VT and chronic systolic heart failure. No additional VT noted since up-titration of Amiodarone. Plan was made for Amiodarone 200 BID until 02/02/2019.    In the  ED, CXR with no acute disease process with cardiomegaly and pulmonary vascular congestion. Creatinine was noted to be elevated above baseline at 3.65 today. High-sensitivity troponin is elevated at 94 with no recurrent anginal symptoms. EKG with atrial flutter HR, 73 with no acute changes.   Past Medical History:  Diagnosis Date  . CAD (coronary artery disease)    a. Initial nonobst by  cath 2009. b. Cath 01/2013 in setting of VT storm: obstructive distal Cx disease (small and terminates in the AV groove, unlikely to cause significant ischemia or electrical instability), nonobstructive RCA disease, EF 15-20%.   . Cerebrovascular accident [facility])    a. 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 Chronic systolic CHF (congestive heart failure) ([place])    a. Likely NICM (out of proportion to CAD). b. 2009 - EF 25-30% by echo, 01/2013: 15-20% by cath. c. 11/2014 Echo: EF 35-40%, Gr1 DD, mild MR, mod TR, PASP 76mmHg.  . CKD (chronic kidney disease), stage II   . Dyslipidemia   . Gout   . HTN (hypertension)   . Hypokalemia   . ICD (implantable cardiac defibrillator) in place   . Insulin dependent diabetes mellitus ([place])   . Lipoma   . Nonischemic cardiomyopathy ([place])    a.  11/2014 Echo: EF 35-40%, Gr1 DD, mild MR, mod TR, PASP 36mmHg.  . OSA (obstructive sleep apnea)    does not wear cpap  . PAF (paroxysmal atrial fibrillation) ([place])    a. Noted 05/2008 by EKG;  b. CHA2DS2VASc = 5-6-->coumadin.  . Paroxysmal VT ([place])    a. s/p St. Jude ICD 2007. b. H/o paroxysmal VT/VF including VT storm 12/2012 admission prompting amio initiation;  c. 01/2013 ICD upgrade SJM [id number] Ellipse VR single lead ICD.  [name] Pulmonary HTN ([place])    a. Mild by cath 01/2013.    Past Surgical History:  Procedure Laterality Date  . CARDIAC CATHETERIZATION     Nonobstructive coronary disease 2009  . CARDIAC DEFIBRILLATOR PLACEMENT     ICD-St. Jude  . IMPLANTABLE CARDIOVERTER DEFIBRILLATOR (ICD) GENERATOR CHANGE N/A 01/15/2014   Procedure: ICD GENERATOR CHANGE;  Surgeon: Evans Lance, MD;  Location: [facility] CATH LAB;  Service: Cardiovascular;  Laterality: N/A;  . LEFT AND RIGHT HEART CATHETERIZATION WITH CORONARY ANGIOGRAM N/A 01/03/2013   Procedure: LEFT AND RIGHT HEART CATHETERIZATION WITH CORONARY ANGIOGRAM;  Surgeon: Peter M Martinique, MD;  Location: [facility] CATH LAB;  Service: Cardiovascular;  Laterality: N/A;  . LIPOMA EXCISION     . RIGHT/LEFT HEART CATH AND CORONARY ANGIOGRAPHY N/A 10/04/2017   Procedure: RIGHT/LEFT HEART CATH AND CORONARY ANGIOGRAPHY;  Surgeon: Larey Dresser, MD;  Location: [place] CV LAB;  Service: Cardiovascular;  Laterality: N/A;  . RIGHT/LEFT HEART CATH AND CORONARY ANGIOGRAPHY N/A 12/06/2018   Procedure: RIGHT/LEFT HEART CATH AND CORONARY ANGIOGRAPHY;  Surgeon: Jolaine Artist, MD;  Location: [place] CV LAB;  Service: Cardiovascular;  Laterality: N/A;     Prior to Admission medications   Medication Sig Start Date End Date Taking? Authorizing Provider  acetaminophen (TYLENOL) 500 MG tablet Take 500-1,000 mg by mouth every 6 (six) hours as needed (for bilateral knee pain).    [provider]  allopurinol (ZYLOPRIM) 300 MG tablet TAKE 1 TABLET BY MOUTH EVERY DAY 11/28/18   Biagio Borg, MD  amiodarone (PACERONE) 200 MG tablet Take 1 tablet (200 mg total) by mouth daily. 02/02/19   Evans Lance, MD  aspirin EC 81 MG EC tablet Take 1 tablet (81 mg  total) by mouth daily. 08/01/17   Lavina Hamman, MD  atorvastatin (LIPITOR) 80 MG tablet TAKE 1 TABLET BY MOUTH EVERYDAY AT BEDTIME 12/08/18   Biagio Borg, MD  carvedilol (COREG) 25 MG tablet Take 0.5 tablets (12.5 mg total) by mouth 2 (two) times daily with a meal. 09/27/18   Larey Dresser, MD  cholecalciferol (VITAMIN D) 1000 units tablet Take 1,000 Units by mouth daily.    [provider]  digoxin (LANOXIN) 0.125 MG tablet Take 1 tablet (0.125 mg total) by mouth every other day. 11/02/18   Larey Dresser, MD  GLUCOMANNAN PO Take 665 mg by mouth daily as needed (cleansing).     [provider]  Insulin Glargine (LANTUS SOLOSTAR) 100 UNIT/ML Solostar Pen INJECT 10 UNITS INTO THE SKIN AT BEDTIME. 06/09/17   Biagio Borg, MD  Insulin Pen Needle (B-D ULTRAFINE III SHORT PEN) 31G X 8 MM MISC USE ONCE DAILY WITH INSULIN 01/14/18   Biagio Borg, MD  Insulin Pen Needle (PEN NEEDLES) 31G X 5 MM MISC 100 each by Does not  apply route 2 (two) times daily. 11/12/17   Lance Sell, NP  isosorbide-hydrALAZINE (BIDIL) 20-37.5 MG tablet Take 2 tablets by mouth 3 (three) times daily. 11/17/18   Bensimhon, Shaune Pascal, MD  LANTUS SOLOSTAR 100 UNIT/ML Solostar Pen INJECT 10 UNITS INTO THE SKIN AT BEDTIME. 01/14/18   Biagio Borg, MD  nitroGLYCERIN (NITROSTAT) 0.4 MG SL tablet PLACE 1 TABLET UNDER TONGUE EVERY 5 MINS X3 DOSES AS NEEDED FOR CHEST PAIN 08/02/18   Biagio Borg, MD  OXYGEN Inhale into the lungs. 2L continuous; 3L on pulse machine    [provider]  potassium chloride SA (K-DUR,KLOR-CON) 20 MEQ tablet Take 1 tablet (20 mEq total) by mouth daily. 07/19/18   Shirley Friar, PA-C  spironolactone (ALDACTONE) 25 MG tablet Take 1 tablet (25 mg total) by mouth daily. 01/25/18   Georgiana Shore, NP  torsemide (DEMADEX) 20 MG tablet Take 20-40 mg by mouth See admin instructions. Take 40 mg by mouth in the morning, 20 mg at lunch, 20 mg in the PM with add'l 20 mg as needed for bloating    [provider]  traMADol (ULTRAM) 50 MG tablet Take 1 tablet (50 mg total) by mouth every 8 (eight) hours as needed. 01/19/17   Biagio Borg, MD  Turmeric 500 MG CAPS Take 1,000 mg by mouth daily as needed (inflammation).     [provider]  warfarin (COUMADIN) 5 MG tablet Take 1 tablet daily except take 1 1/2 tablets on Mon Wed and Fri or Take as directed by Coumadin clinic. Patient taking differently: Take 5-7.5 mg by mouth See admin instructions. Take 7.5 mg by mouth on Monday, Wednesday and Friday and take 5 mg on Tuesday, Thursday, Saturday and Sunday 09/27/18   Biagio Borg, MD    Inpatient Medications: Scheduled Meds: . sodium chloride flush  3 mL Intravenous Once   Continuous Infusions:  PRN Meds:   Allergies:   No Known Allergies  Social History:   Social History   Socioeconomic History  . Marital status: Married    Spouse name: Not on file  . Number of children: 2  . Years of  education: Not on file  . Highest education level: Not on file  Occupational History  . Occupation: medically retired due to heart failure  Social Needs  . Financial resource strain: Not on file  .  Food insecurity    Worry: Not on file    Inability: Not on file  . Transportation needs    Medical: Yes    Non-medical: No  Tobacco Use  . Smoking status: Former Smoker    Quit date: 05/04/1986    Years since quitting: 32.6  . Smokeless tobacco: Never Used  . Tobacco comment: Quit smoking 30 yrs ago. Smoked as teenager less than 1/2 ppd. Smoked x 4 years.  Substance and Sexual Activity  . Alcohol use: No    Comment: occasionally  . Drug use: No  . Sexual activity: Never  Lifestyle  . Physical activity    Days per week: 0 days    Minutes per session: 0 min  . Stress: Not at all  Relationships  . Social Herbalist on phone: Not on file    Gets together: Not on file    Attends religious service: Not on file    Active member of club or organization: Not on file    Attends meetings of clubs or organizations: Not on file    Relationship status: Not on file  . Intimate partner violence    Fear of current or ex partner: Not on file    Emotionally abused: Not on file    Physically abused: Not on file    Forced sexual activity: Not on file  Other Topics Concern  . Not on file  Social History Narrative  . Not on file    Family History:   Family History  Problem Relation Age of Onset  . Coronary artery disease Mother   . Heart attack Mother   . Heart attack Maternal Uncle   . Heart attack Maternal Grandmother    Family Status:  Family Status  Relation Name Status  . Mother  Deceased  . Father  Deceased       unknown to patient  . Mat Uncle  Deceased  . MGM  Deceased  . MGF  Deceased  . PGM  Deceased  . PGF  Deceased    ROS:  Please see the history of present illness.  All other ROS reviewed and negative.     Physical Exam/Data:   Vitals:   01/02/19 1336  01/02/19 1347  BP: (!) 141/122 102/79  Pulse: 72   Resp: (!) 24 (!) 21  Temp: 98.4 F (36.9 C)   TempSrc: Oral   SpO2: 93% 91%   No intake or output data in the 24 hours ending 01/02/19 1522 There were no vitals filed for this visit. There is no height or weight on file to calculate BMI.   General: Ill-appearing, NAD Neck: Negative for carotid bruits. + JVD Lungs: Bilateral lower lobe crackles. Breathing is mildly labored Cardiovascular: Irregular with S1 S2. + murmurs Abdomen: Soft, non-tender, non-distended. No obvious abdominal masses. Extremities: 1+ BLE edema. No clubbing or cyanosis. DP pulses 1+ bilaterally Neuro: Alert and oriented. No focal deficits. No facial asymmetry. MAE spontaneously. Psych: Responds to questions appropriately with normal affect.    EKG:  The EKG was personally reviewed and demonstrates: 01/02/2019 Atrial flutter with HR 74, no acute changes  Telemetry:  Telemetry was personally reviewed and demonstrates: 01/02/2019 NSR with PVCs, HR 70s  Relevant CV Studies:  [facility] 12/06/2018:  Mid Cx to Dist Cx lesion is 50% stenosed.  Prox RCA lesion is 20% stenosed.  Mid LAD lesion is 30% stenosed.   Findings:  Ao = 115/80 (95) LV =  100/12 RA =  10 RV = 74/14 PA = 73/36 (48) PCW = 9 Fick cardiac output/index = 5.7/2.4 Thermo CO/CI = 4.0/1.7 PVR = 6.8 (Fick) PVR = 9.7 (Thermo) FA sat = 96% PA sat = 65%, 67%  Assessment:  1. Minimal non-obstructive CAD 2. Severe NICM EF 10-15% by echo with severe biventricular dysfunction 3. Moderate PAH with normal left-sided pressures 4. Moderate to severely reduced CO  Plan/Discussion:  Continue medical therapy. Ideally would need advanced therapies but renal function prohibitive.   Glori Bickers, MD  2:59 PM  Echocardiogram 11/17/2018:   1. The left ventricle has severely reduced systolic function, with an ejection fraction of 20-25%. The cavity size was mild to moderately dilated. There is  moderately increased left ventricular wall thickness. Left ventricular diastolic Doppler  parameters are consistent with impaired relaxation. There is right ventricular volume and pressure overload. Left ventricular diffuse hypokinesis.  2. The right ventricle has severely reduced systolic function. The cavity was moderately enlarged. There is no increase in right ventricular wall thickness.  3. Right atrial size was severely dilated.  4. The pericardial effusion is posterior to the left ventricle.  5. Trivial pericardial effusion is present.  6. There is mild dilatation of the ascending aorta.  7. The inferior vena cava was dilated in size with >50% respiratory variability.   V/Q scan 10/07/17:No appreciable ventilation or perfusion defects.  PYP 10/05/17:Visual and quantitative assessment (grade 0/1, H/CLL equal 1.3) are equivocal for transthyretin amyloidosis.  LHC 10/04/17: Left Main  No significant disease.  Left Anterior Descending  Large, wrap-around LAD with 30% mid vessel stenosis.  Left Circumflex  50-60% distal LCx stenosis.  Right Coronary Artery  50% mid RCA stenosis.    [place] 10/04/17: RA mean 19 RV 72/23 PA 74/37, mean 51 PCWP mean 19 LV 111/28 AO 112/80 Oxygen saturations: PA 55% AO 95% Cardiac Output (Fick) 4.68  Cardiac Index (Fick) 1.9  PVR 6.8 WU CVP/PCWP 1 PAPi 1.95 1. Nonischemic cardiomyopathy. Coronary disease well out of proportion to degree of cardiomyopathy.  2. Elevated R>L heart filling pressures, suggestive of significant RV failure with high CVP/PCWP ratio and PAPi <2.  3. Low cardiac output.  4. Mixed pulmonary venous/pulmonary arterial hypertension, ?due to hypoxemia from OSA or OHS/OSA.  Laboratory Data:  Chemistry Recent Labs  Lab 01/02/19 1335  NA 137  K 4.4  CL 100  CO2 24  GLUCOSE 156*  BUN 65*  CREATININE 3.65*  CALCIUM 9.5  GFRNONAA 17*  GFRAA 19*  ANIONGAP 13    Total Protein  Date Value Ref Range Status   11/01/2018 7.1 6.0 - 8.3 g/dL Final  06/28/2017 7.2 6.0 - 8.5 g/dL Final   Albumin  Date Value Ref Range Status  11/01/2018 3.9 3.5 - 5.2 g/dL Final  06/28/2017 4.2 3.6 - 4.8 g/dL Final   AST  Date Value Ref Range Status  11/01/2018 45 (H) 0 - 37 U/L Final   ALT  Date Value Ref Range Status  11/01/2018 42 0 - 53 U/L Final   Alkaline Phosphatase  Date Value Ref Range Status  11/01/2018 146 (H) 39 - 117 U/L Final   Total Bilirubin  Date Value Ref Range Status  11/01/2018 2.4 (H) 0.2 - 1.2 mg/dL Final   Bilirubin Total  Date Value Ref Range Status  06/28/2017 1.3 (H) 0.0 - 1.2 mg/dL Final   Hematology Recent Labs  Lab 01/02/19 1335  WBC 6.6  RBC 3.94*  HGB 12.5*  HCT 39.5  MCV 100.3[facility]  31.7  MCHC 31.6  RDW 17.1*  PLT 190   Cardiac EnzymesNo results for input(s): TROPONINI in the last 168 hours. No results for input(s): TROPIPOC in the last 168 hours.  BNPNo results for input(s): BNP, PROBNP in the last 168 hours.  DDimer No results for input(s): DDIMER in the last 168 hours. TSH:  Lab Results  Component Value Date   TSH 1.14 07/21/2018   Lipids: Lab Results  Component Value Date   CHOL 137 02/08/2018   HDL 33.00 (L) 02/08/2018   LDLCALC 88 02/08/2018   LDLDIRECT 194.6 05/09/2007   TRIG 78.0 02/08/2018   CHOLHDL 4 02/08/2018   HgbA1c: Lab Results  Component Value Date   HGBA1C 6.5 07/21/2018   Radiology/Studies:  Dg Chest 2 View  Result Date: 01/02/2019 CLINICAL DATA:  Chest and epigastric pain and shortness of breath for 1 week. EXAM: CHEST - 2 VIEW COMPARISON:  Single-view of the chest 10/07/2017. FINDINGS: There is cardiomegaly and vascular congestion. AICD is in place. No consolidative process, pneumothorax or effusion. Atherosclerosis noted. IMPRESSION: No acute disease. Cardiomegaly and pulmonary vascular congestion. Atherosclerosis. Electronically Signed   By: Inge Rise M.D.   On: 01/02/2019 14:19   Assessment and Plan:   1.   Chronic systolic heart failure with biventricular dysfunction secondary to nonischemic cardiomyopathy: -Presented with worsening dyspnea on exertion over the last several weeks found to be moderately fluid volume overloaded on exam>>likely in the setting of atrial flutter -Last echocardiogram 11/17/2018 with LV EF of 20 to 79% with diastolic dysfunction and left ventricular diffuse hypokinesis -Right and left heart cath with nonobstructive CAD and severe nonischemic cardiomyopathy with severe biventricular dysfunction, moderate PAH with normal left-sided pressures and moderate to severely reduced cardiac output -Lastest diuretic regimen includes torsemide 40/20>> most recently was taken off evening dose of torsemide secondary to worsening renal function per patient's nephrologist -CHF exacerbation likely in the setting of atrial flutter per EKG -Consider nephrology consultation at this time for fluid volume management and worsening renal function -Decrease carvedilol to 6.25 mg twice daily to allow BP room for diuresis, continue digoxin 0.125 mg -On home BiDil 1 tab 3 times daily and spironolactone 25 mg daily  2.  CKD stage III: -Creatinine, 3.65 today up from 2.68 on 12/06/2018 -Baseline appears to be in the 1.9-2.4 range  -Consider nephrology consultation for diuretic assistance given progressively worsening kidney function -IV Lasix 80 mg twice daily and monitor renal function closely with daily BMET  3.  CAD: -LHC 10/2017 with 50 to 60% distal left LCx and 50% M RCA stenosis and no significant LM or LAD disease -Most recent cardiac cath 12/06/2018 with mild nonobstructive CAD -Had one episode of anginal pain relieved with SL NTG>>> likely secondary to fluid volume overload and atrial flutter, demand ischemia -HST, 94>>> will repeat to assess delta change -Continue statin, beta-blocker -No ASA, on Coumadin for PAF -No acute S/S bleeding  4.  PAF: -EKG with atrial flutter and no acute ischemic  changes, HR stable -On chronic Coumadin, followed by heart care Coumadin clinic -INR from 01/02/2019, 2.5 -May need transition to heparin gtt while inpatient status -ICD rep to interrogate for possible pacing -NPO after MDN for DCCV tomorrow 01/03/2019  5.  History of VT: -[place] ICD in place -Last interrogation with no VT/VF or ICD shocks  6.  Pulmonary hypertension with OSA/CPAP: -On home supplemental O2 2 L -Repeat RHC 12/06/2018 with no significant change from prior -Follows with Dr. Halford Chessman, repeating sleep  study -Continue CPAP   For questions or updates, please contact [place] Please consult [email or web address] for contact info under Cardiology/STEMI.   Lyndel Safe NP-C [facility] Pager: [phone] 01/02/2019 3:22 PM  Patient seen and examined with the above-signed Advanced Practice Provider and/or Housestaff. I personally reviewed laboratory data, imaging studies and relevant notes. I independently examined the patient and formulated the important aspects of the plan. I have edited the note to reflect any of my changes or salient points. I have personally discussed the plan with the patient and/or family.  63 y/o male with h/o severe systolic HF due to NICM EF 20-25%, PAH, CKD 3-4 and PAF well known to me form HF Clinic. Recently underwent R/L cath for worsening HF symptoms. Cath with normal coronaries and moderate PAH with normal PCWP and mildly decreasesde CO.   Over the last week has had worsening HF symptoms and fatigue. Presented to ER. Found to have A/CKD with creatinine up to 3.65 ECG also shows new AFL/Atach  On exam Weak appearing NAD HEENT: normal Neck: supple. JVP 9-10. Carotids 2+ bilat; no bruits. No lymphadenopathy or thryomegaly appreciated. Cor: PMI nondisplaced. Irregular rate & rhythm. No rubs, gallops or murmurs. Lungs: clear Abdomen: obese soft, nontender, nondistended. No hepatosplenomegaly. No bruits or masses. Good bowel sounds.  Extremities: no cyanosis, clubbing, rash, edema Neuro: alert & orientedx3, cranial nerves grossly intact. moves all 4 extremities w/o difficulty. Affect pleasant  I suspect he has low output in setting of new onset AFL/Atach. Volume status not too bad. Would hold off on diuresis for now. INR 2.5. Continue warfarin. Plan DC-CV tomorrow. Watch closely for need for inotropes. ICD interrogated personally with rep. Single lead device. No VT/VF. Unable to assess for AF.   Glori Bickers, MD  8:42 PM

## 2019-01-02 NOTE — ED Notes (Signed)
Patient transported to X-ray 

## 2019-01-02 NOTE — ED Provider Notes (Signed)
[facility] EMERGENCY DEPARTMENT Provider Note   CSN: [id number] Arrival date & time: 01/02/19  1324     History   Chief Complaint Chief Complaint  Patient presents with   Chest Pain   Shortness of Breath    HPI [name] is a 63 y.o. male with history of CAD with recent heart cath earlier this month showing nonobstructive disease, CHF with EF of 10 to 15%, moderate pulmonary hypertension, CVA, CKD, HLD, HTN, ICD in place, paroxysmal A. fib, OSA, and diabetes mellitus presenting for evaluation of cute onset, progressively worsening shortness of breath for 1 week.  He reports shortness of breath at rest but significant dyspnea on exertion worse than baseline.  He has peripheral edema both in his abdomen and his lower extremities and reports he feels as though his abdomen is bloated.  Denies any nausea or vomiting.  No fevers, chills, or cough.  Denies urinary symptoms.  He reports PND and orthopnea, though states that these are chronic.  He has had 2 episodes of left-sided chest pain which he described as a sharp sensation that resolved with sublingual nitroglycerin.  He is a non-smoker, denies recreational drug use or excessive alcohol intake.  He reports last week he had an appointment with his nephrologist who told him his kidney function was worse and instructed him to discontinue his nightly dose of torsemide for 2 days.     The history is provided by the patient.    Past Medical History:  Diagnosis Date   CAD (coronary artery disease)    a. Initial nonobst by cath 2009. b. Cath 01/2013 in setting of VT storm: obstructive distal Cx disease (small and terminates in the AV groove, unlikely to cause significant ischemia or electrical instability), nonobstructive RCA disease, EF 15-20%.    Cerebrovascular accident [facility])    a. Basilar CVA 2000. denies deficits   Chronic systolic CHF (congestive heart failure) ([place])    a. Likely NICM (out of proportion to CAD).  b. 2009 - EF 25-30% by echo, 01/2013: 15-20% by cath. c. 11/2014 Echo: EF 35-40%, Gr1 DD, mild MR, mod TR, PASP 40mmHg.   CKD (chronic kidney disease), stage II    Dyslipidemia    Gout    HTN (hypertension)    Hypokalemia    ICD (implantable cardiac defibrillator) in place    Insulin dependent diabetes mellitus ([place])    Lipoma    Nonischemic cardiomyopathy ([place])    a.  11/2014 Echo: EF 35-40%, Gr1 DD, mild MR, mod TR, PASP 43mmHg.   OSA (obstructive sleep apnea)    does not wear cpap   PAF (paroxysmal atrial fibrillation) ([place])    a. Noted 05/2008 by EKG;  b. CHA2DS2VASc = 5-6-->coumadin.   Paroxysmal VT ([place])    a. s/p St. Jude ICD 2007. b. H/o paroxysmal VT/VF including VT storm 12/2012 admission prompting amio initiation;  c. 01/2013 ICD upgrade SJM 1411-36Q Ellipse VR single lead ICD.   Pulmonary HTN ([place])    a. Mild by cath 01/2013.    Patient Active Problem List   Diagnosis Date Noted   Systolic congestive heart failure ([place]) 01/02/2019   Acute kidney injury superimposed on chronic kidney disease ([place]) 01/02/2019   Chronic respiratory failure ([place]) 07/21/2018   Degenerative arthritis of knee, bilateral 10/30/202019   Essential hypertension 10/01/2017   Wellness examination 08/07/2017   Obesity (BMI 35.0-39.9 without comorbidity) 08/03/2017   Dyslipidemia 08/03/2017   CKD (chronic kidney disease), stage III ([place])  08/03/2017   Ischemic chest pain ([place]) 07/27/2017   Hypoxia 09/22/2016   Tear of MCL (medial collateral ligament) of knee, left, initial encounter 08/21/2016   Degenerative arthritis of left knee 08/21/2016   Morbid obesity ([place]) 12/05/2014   Type 2 diabetes mellitus ([place]) 11/21/2014   OSA (obstructive sleep apnea) 11/21/2014   Encounter for therapeutic drug monitoring 06/23/2013   PAF (paroxysmal atrial fibrillation) ([place]) 12/30/2012   Nonischemic cardiomyopathy ([place])    Ventricular tachycardia ([place]) 11/16/2012   Hearing loss, left  03/18/2012   Dizziness 12/22/2011   Obesity 01/16/2011   Long term (current) use of anticoagulants 08/05/2010   Automatic implantable cardioverter-defibrillator in situ 08/15/2009   FATIGUE 06/17/2009   Coronary atherosclerosis 10/11/2008   Paroxysmal ventricular tachycardia ([place]) 08/28/2008   Atrial fibrillation ([place]) 08/28/2008   History of cardiovascular disorder 12/06/2007   Hypertensive heart disease 05/09/2007   LIPOMA 05/06/2007   Hyperlipidemia 05/06/2007   Gout 05/06/2007   Chronic combined systolic and diastolic CHF (congestive heart failure) ([place]) 05/06/2007   ACUTE BUT ILL-DEFINED CEREBROVASCULAR DISEASE 05/06/2007   DENTAL CARIES 05/06/2007    Past Surgical History:  Procedure Laterality Date   CARDIAC CATHETERIZATION     Nonobstructive coronary disease 2009   CARDIAC DEFIBRILLATOR PLACEMENT     ICD-St. Jude   IMPLANTABLE CARDIOVERTER DEFIBRILLATOR (ICD) GENERATOR CHANGE N/A 01/15/2014   Procedure: ICD GENERATOR CHANGE;  Surgeon: Evans Lance, MD;  Location: [facility] CATH LAB;  Service: Cardiovascular;  Laterality: N/A;   LEFT AND RIGHT HEART CATHETERIZATION WITH CORONARY ANGIOGRAM N/A 01/03/2013   Procedure: LEFT AND RIGHT HEART CATHETERIZATION WITH CORONARY ANGIOGRAM;  Surgeon: Peter M Martinique, MD;  Location: [facility] CATH LAB;  Service: Cardiovascular;  Laterality: N/A;   LIPOMA EXCISION     RIGHT/LEFT HEART CATH AND CORONARY ANGIOGRAPHY N/A 10/04/2017   Procedure: RIGHT/LEFT HEART CATH AND CORONARY ANGIOGRAPHY;  Surgeon: Larey Dresser, MD;  Location: [facility] CV LAB;  Service: Cardiovascular;  Laterality: N/A;   RIGHT/LEFT HEART CATH AND CORONARY ANGIOGRAPHY N/A 12/06/2018   Procedure: RIGHT/LEFT HEART CATH AND CORONARY ANGIOGRAPHY;  Surgeon: Jolaine Artist, MD;  Location: [facility] CV LAB;  Service: Cardiovascular;  Laterality: N/A;        Home Medications    Prior to Admission medications   Medication Sig Start Date End Date Taking?  Authorizing Provider  acetaminophen (TYLENOL) 500 MG tablet Take 500-1,000 mg by mouth every 6 (six) hours as needed (for bilateral knee pain).   Yes [provider]  allopurinol (ZYLOPRIM) 300 MG tablet TAKE 1 TABLET BY MOUTH EVERY DAY Patient taking differently: Take 300 mg by mouth daily.  11/28/18  Yes Biagio Borg, MD  amiodarone (PACERONE) 200 MG tablet Take 1 tablet (200 mg total) by mouth daily. Patient taking differently: Take 200 mg by mouth 2 (two) times daily.  02/02/19  Yes Evans Lance, MD  aspirin EC 81 MG EC tablet Take 1 tablet (81 mg total) by mouth daily. 08/01/17  Yes Lavina Hamman, MD  atorvastatin (LIPITOR) 80 MG tablet TAKE 1 TABLET BY MOUTH EVERYDAY AT BEDTIME Patient taking differently: Take 80 mg by mouth daily at 6 PM.  12/08/18  Yes Biagio Borg, MD  carvedilol (COREG) 25 MG tablet Take 0.5 tablets (12.5 mg total) by mouth 2 (two) times daily with a meal. 09/27/18  Yes Larey Dresser, MD  cholecalciferol (VITAMIN D) 1000 units tablet Take 1,000 Units by mouth daily.   Yes [provider]  digoxin (  LANOXIN) 0.125 MG tablet Take 1 tablet (0.125 mg total) by mouth every other day. 11/02/18  Yes Larey Dresser, MD  GLUCOMANNAN PO Take 665 mg by mouth daily as needed (cleansing).    Yes [provider]  Insulin Glargine (LANTUS SOLOSTAR) 100 UNIT/ML Solostar Pen INJECT 10 UNITS INTO THE SKIN AT BEDTIME. Patient taking differently: Inject 10 Units into the skin at bedtime. INJECT 10 UNITS INTO THE SKIN AT BEDTIME. 06/09/17  Yes Biagio Borg, MD  isosorbide-hydrALAZINE (BIDIL) 20-37.5 MG tablet Take 2 tablets by mouth 3 (three) times daily. 11/17/18  Yes Bensimhon, Shaune Pascal, MD  nitroGLYCERIN (NITROSTAT) 0.4 MG SL tablet PLACE 1 TABLET UNDER TONGUE EVERY 5 MINS X3 DOSES AS NEEDED FOR CHEST PAIN Patient taking differently: Place 0.4 mg under the tongue every 5 (five) minutes as needed for chest pain.  08/02/18  Yes Biagio Borg, MD  OXYGEN Inhale into  the lungs. 2L continuous; 3L on pulse machine   Yes [provider]  potassium chloride SA (K-DUR,KLOR-CON) 20 MEQ tablet Take 1 tablet (20 mEq total) by mouth daily. 07/19/18  Yes Shirley Friar, PA-C  spironolactone (ALDACTONE) 25 MG tablet Take 1 tablet (25 mg total) by mouth daily. 01/25/18  Yes Georgiana Shore, NP  torsemide (DEMADEX) 20 MG tablet Take 20-40 mg by mouth See admin instructions. Take 20 mg by mouth in the morning, 20 mg at lunch,  with add'l 20 mg as needed for bloating in the pm   Yes [provider]  traMADol (ULTRAM) 50 MG tablet Take 1 tablet (50 mg total) by mouth every 8 (eight) hours as needed. 01/19/17  Yes Biagio Borg, MD  Turmeric 500 MG CAPS Take 1,000 mg by mouth daily at 12 noon.    Yes [provider]  warfarin (COUMADIN) 5 MG tablet Take 1 tablet daily except take 1 1/2 tablets on Mon Wed and Fri or Take as directed by Coumadin clinic. Patient taking differently: Take 5-7.5 mg by mouth See admin instructions. Take 7.5 mg by mouth on Monday, Wednesday and Friday and take 5 mg on Tuesday, Thursday, Saturday and Sunday in the evening 09/27/18  Yes Biagio Borg, MD  Insulin Pen Needle (B-D ULTRAFINE III SHORT PEN) 31G X 8 MM MISC USE ONCE DAILY WITH INSULIN 01/14/18   Biagio Borg, MD  Insulin Pen Needle (PEN NEEDLES) 31G X 5 MM MISC 100 each by Does not apply route 2 (two) times daily. 11/12/17   Shambley, Delphia Grates, NP  LANTUS SOLOSTAR 100 UNIT/ML Solostar Pen INJECT 10 UNITS INTO THE SKIN AT BEDTIME. Patient not taking: Reported on 01/02/2019 01/14/18   Biagio Borg, MD    Family History Family History  Problem Relation Age of Onset   Coronary artery disease Mother    Heart attack Mother    Heart attack Maternal Uncle    Heart attack Maternal Grandmother     Social History Social History   Tobacco Use   Smoking status: Former Smoker    Quit date: 05/04/1986    Years since quitting: 32.6   Smokeless tobacco: Never  Used   Tobacco comment: Quit smoking 30 yrs ago. Smoked as teenager less than 1/2 ppd. Smoked x 4 years.  Substance Use Topics   Alcohol use: No    Comment: occasionally   Drug use: No     Allergies   Patient has no known allergies.   Review of Systems Review of Systems  Constitutional:  Negative for chills and fever.  Respiratory: Positive for shortness of breath.   Cardiovascular: Positive for chest pain and leg swelling.  Gastrointestinal: Positive for abdominal distention. Negative for abdominal pain, nausea and vomiting.  All other systems reviewed and are negative.    Physical Exam Updated Vital Signs BP 113/83 (BP Location: Right Arm)    Pulse 74    Temp 97.8 F (36.6 C) (Oral)    Resp 18    Ht 5\' 10"  (1.778 m)    Wt 114.6 kg    SpO2 100%    BMI 36.24 kg/m   Physical Exam Vitals signs and nursing note reviewed.  Constitutional:      General: He is not in acute distress.    Appearance: He is well-developed.  HENT:     Head: Normocephalic and atraumatic.  Eyes:     General:        Right eye: No discharge.        Left eye: No discharge.     Conjunctiva/sclera: Conjunctivae normal.  Neck:     Vascular: No JVD.     Trachea: No tracheal deviation.  Cardiovascular:     Rate and Rhythm: Normal rate.     Pulses:          Radial pulses are 2+ on the right side and 2+ on the left side.       Dorsalis pedis pulses are 2+ on the right side and 2+ on the left side.       Posterior tibial pulses are 2+ on the right side and 2+ on the left side.     Heart sounds: Murmur present.     Comments: 2+ pitting edema in the bilateral lower extremities Pulmonary:     Effort: Pulmonary effort is normal.     Comments: Globally diminished breath sounds, tachypneic Abdominal:     General: Abdomen is protuberant. There is no distension.     Palpations: Abdomen is soft.     Tenderness: There is no abdominal tenderness. There is no guarding or rebound.  Skin:    General: Skin is  warm and dry.     Findings: No erythema.  Neurological:     Mental Status: He is alert.  Psychiatric:        Behavior: Behavior normal.      ED Treatments / Results  Labs (all labs ordered are listed, but only abnormal results are displayed) Labs Reviewed  BASIC METABOLIC PANEL - Abnormal; Notable for the following components:      Result Value   Glucose, Bld 156 (*)    BUN 65 (*)    Creatinine, Ser 3.65 (*)    GFR calc non Af Amer 17 (*)    GFR calc Af Amer 19 (*)    All other components within normal limits  CBC - Abnormal; Notable for the following components:   RBC 3.94 (*)    Hemoglobin 12.5 (*)    MCV 100.3 (*)    RDW 17.1 (*)    nRBC 0.3 (*)    All other components within normal limits  PROTIME-INR - Abnormal; Notable for the following components:   Prothrombin Time 26.6 (*)    INR 2.5 (*)    All other components within normal limits  DIGOXIN LEVEL - Abnormal; Notable for the following components:   Digoxin Level 0.7 (*)    All other components within normal limits  BRAIN NATRIURETIC PEPTIDE - Abnormal; Notable for the following components:  B Natriuretic Peptide 2,154.7 (*)    All other components within normal limits  GLUCOSE, CAPILLARY - Abnormal; Notable for the following components:   Glucose-Capillary 161 (*)    All other components within normal limits  TROPONIN I (HIGH SENSITIVITY) - Abnormal; Notable for the following components:   Troponin I (High Sensitivity) 94 (*)    All other components within normal limits  TROPONIN I (HIGH SENSITIVITY) - Abnormal; Notable for the following components:   Troponin I (High Sensitivity) 93 (*)    All other components within normal limits  SARS CORONAVIRUS 2 (HOSPITAL ORDER, PERFORMED [facility])  URINALYSIS, ROUTINE W REFLEX MICROSCOPIC  CREATININE, URINE, RANDOM  SODIUM, URINE, RANDOM  BASIC METABOLIC PANEL  PROTIME-INR  MAGNESIUM  UREA NITROGEN, URINE    EKG EKG  Interpretation  Date/Time:  Monday January 02 2019 13:28:47 EDT Ventricular Rate:  73 PR Interval:  176 QRS Duration: 124 QT Interval:  358 QTC Calculation: 394 R Axis:   -172 Text Interpretation:  Normal sinus rhythm with sinus arrhythmia Inferior infarct , age undetermined Possible Anterolateral infarct , age undetermined Abnormal ECG No significant change since last tracing Confirmed by Deno Etienne [phone]) on 01/02/2019 1:39:01 PM   Radiology Dg Chest 2 View  Result Date: 01/02/2019 CLINICAL DATA:  Chest and epigastric pain and shortness of breath for 1 week. EXAM: CHEST - 2 VIEW COMPARISON:  Single-view of the chest 10/07/2017. FINDINGS: There is cardiomegaly and vascular congestion. AICD is in place. No consolidative process, pneumothorax or effusion. Atherosclerosis noted. IMPRESSION: No acute disease. Cardiomegaly and pulmonary vascular congestion. Atherosclerosis. Electronically Signed   By: Inge Rise M.D.   On: 01/02/2019 14:19    Procedures Procedures (including critical care time)  Medications Ordered in ED Medications  sodium chloride flush (NS) 0.9 % injection 3 mL (3 mLs Intravenous Not Given 01/02/19 1741)  amiodarone (PACERONE) tablet 200 mg (200 mg Oral Given 01/02/19 2143)  atorvastatin (LIPITOR) tablet 80 mg (80 mg Oral Given 01/02/19 2028)  digoxin (LANOXIN) tablet 0.125 mg (has no administration in time range)  nitroGLYCERIN (NITROSTAT) SL tablet 0.4 mg (has no administration in time range)  carvedilol (COREG) tablet 6.25 mg (6.25 mg Oral Given 01/02/19 2028)  Warfarin - Pharmacist Dosing Inpatient (has no administration in time range)  sodium chloride flush (NS) 0.9 % injection 3 mL (3 mLs Intravenous Given 01/02/19 2143)  sodium chloride flush (NS) 0.9 % injection 3 mL (has no administration in time range)  0.9 %  sodium chloride infusion (has no administration in time range)  acetaminophen (TYLENOL) tablet 650 mg (has no administration in time range)   ondansetron (ZOFRAN) injection 4 mg (has no administration in time range)  heparin injection 5,000 Units (5,000 Units Subcutaneous Given 01/02/19 2143)  insulin aspart (novoLOG) injection 0-9 Units (has no administration in time range)  warfarin (COUMADIN) tablet 7.5 mg (7.5 mg Oral Given 01/02/19 2028)     Initial Impression / Assessment and Plan / ED Course  I have reviewed the triage vital signs and the nursing notes.  Pertinent labs & imaging results that were available during my care of the patient were reviewed by me and considered in my medical decision making (see chart for details).  Patient presenting for progressively worsening shortness of breath, generalized weakness, worsening peripheral edema.  He is afebrile, somewhat tachypneic in the ED.  Work-up today shows worsening renal function, elevated BNP, chest x-ray with signs of volume overload.  His EKG shows no  acute ischemic abnormalities.  Pacemaker was interrogated, representative called to tell me that he had no firing from his device.  Given his multiple comorbidities, worsening volume overload and AKI I feel he would benefit from admission to the hospital for further evaluation and management.  Cardiology was consulted and will see the patient emergently in the ED.  Spoke with Dr. Tamala Julian with [facility] hospitalist service who agrees to assume care patient and bring him into the hospital for further evaluation and management.  Final Clinical Impressions(s) / ED Diagnoses   Final diagnoses:  AKI (acute kidney injury) ([place])  Elevated brain natriuretic peptide (BNP) level    ED Discharge Orders    None       Renita Papa, PA-C 01/02/19 [facility], DO 01/03/19 1506

## 2019-01-02 NOTE — ED Notes (Signed)
Urologist at [facility]

## 2019-01-02 NOTE — Progress Notes (Signed)
ANTICOAGULATION CONSULT NOTE - Initial Consult  Pharmacy Consult for warfarin Indication: atrial fibrillation  No Known Allergies  Patient Measurements:    Vital Signs: Temp: 98.4 F (36.9 C) (08/31 1336) Temp Source: Oral (08/31 1336) BP: 112/86 (08/31 1700) Pulse Rate: 79 (08/31 1700)  Labs: Recent Labs    01/02/19 1335 01/02/19 1552  HGB 12.5*  --   HCT 39.5  --   PLT 190  --   LABPROT  --  26.6*  INR  --  2.5*  CREATININE 3.65*  --   TROPONINIHS 94* 93*    Estimated Creatinine Clearance: 26.9 mL/min (A) (by C-G formula based on SCr of 3.65 mg/dL (H)).   Medical History: Past Medical History:  Diagnosis Date  . CAD (coronary artery disease)    a. Initial nonobst by cath 2009. b. Cath 01/2013 in setting of VT storm: obstructive distal Cx disease (small and terminates in the AV groove, unlikely to cause significant ischemia or electrical instability), nonobstructive RCA disease, EF 15-20%.   . Cerebrovascular accident [facility])    a. 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 Chronic systolic CHF (congestive heart failure) ([place])    a. Likely NICM (out of proportion to CAD). b. 2009 - EF 25-30% by echo, 01/2013: 15-20% by cath. c. 11/2014 Echo: EF 35-40%, Gr1 DD, mild MR, mod TR, PASP 79mmHg.  . CKD (chronic kidney disease), stage II   . Dyslipidemia   . Gout   . HTN (hypertension)   . Hypokalemia   . ICD (implantable cardiac defibrillator) in place   . Insulin dependent diabetes mellitus ([place])   . Lipoma   . Nonischemic cardiomyopathy ([place])    a.  11/2014 Echo: EF 35-40%, Gr1 DD, mild MR, mod TR, PASP 19mmHg.  . OSA (obstructive sleep apnea)    does not wear cpap  . PAF (paroxysmal atrial fibrillation) ([place])    a. Noted 05/2008 by EKG;  b. CHA2DS2VASc = 5-6-->coumadin.  . Paroxysmal VT ([place])    a. s/p St. Jude ICD 2007. b. H/o paroxysmal VT/VF including VT storm 12/2012 admission prompting amio initiation;  c. 01/2013 ICD upgrade SJM 1411-36Q Ellipse VR single lead ICD.  [name]  Pulmonary HTN ([place])    a. Mild by cath 01/2013.    Medications:  Scheduled:  . amiodarone  200 mg Oral BID  . atorvastatin  80 mg Oral q1800  . carvedilol  6.25 mg Oral BID WC  . [START ON 01/03/2019] digoxin  0.125 mg Oral QODAY  . furosemide  80 mg Intravenous BID  . sodium chloride flush  3 mL Intravenous Once    Assessment: 70 yoM admitted for volume overload. On warfarin PTA for PAF, PTA dose is 7.5 mg on MWF, 5 mg on all other days. Last dose taken 8/30 at unknown time. INR on arrival is therapeutic at 2.5. Also on amio 200 mg PO daily PTA, continued while admitted. Pharmacy consulted to dose warfarin.  Hgb low 12.5. Plt wnl 190. No bleeding noted.  Goal of Therapy:  INR 2-3 Monitor platelets by anticoagulation protocol: Yes   Plan:  Warfarin 7.5 mg PO x once today Monitor daily INR and s/sx of bleeding  Berenice Bouton, PharmD PGY1 Pharmacy Resident Office phone: [phone] Phone until 9 pm: [phone] 01/02/2019,5:22 PM

## 2019-01-02 NOTE — ED Triage Notes (Signed)
Pt reports SHOB, chest pain and swelling to his abdomen. Worsening SHOB over the last week. Pt reports the chest pain started a couple days ago. Pt reports he took nitroglycerin last night for the CP with little relief.  Pt wear 2L of O2 at home.

## 2019-01-02 NOTE — Consult Note (Signed)
[facility]  HISTORY AND PHYSICAL  [name] is an 63 y.o. male.    Chief Complaint: SOB/ LE edema/ fatigue  HPI:   [name] is a 51M with a PMH significant for HTN, CAD, VT on amiodarone, BiV failure with EF 10-15%, ICD placement, pulm HTN, and DM who is now seen in consultation at the request of Dr. Tamala Julian for evaluation and recommendations surrounding AKI on CKD.    [name] was last seen in our clinic 8/19- followed by Dr [name].  Cr was 2.99, up from 2.4 baseline.  Weight was 263 lbs then as well.  Was instructed to decrease torsemide from 40 qAM/ 20 mid/ 20 q PM to 40 q AM 20 q mid.    [name] presents to ED for gradually worsening fatigue, SOB, and an episode of chest pain.  EKG without ischemic changes but new Aflutter Atach.  Last L and R heart cath 8/4 showed nonobstructive coronary disease and unchanged pHTN.  EF at that time had improved to 20-25%.  Today in the ED, Cr 3.65 and CXR with pulm edema/ vascular congestion.  Has had to turn up his chronic O2 from 2-2.5L.  In this setting we are asked to see.  He has been started on lasix IV 80 mg BID.  Aldactone has been held.  He has already urinated 3x since being in the ED.    PMH: Past Medical History:  Diagnosis Date  . CAD (coronary artery disease)    a. Initial nonobst by cath 2009. b. Cath 01/2013 in setting of VT storm: obstructive distal Cx disease (small and terminates in the AV groove, unlikely to cause significant ischemia or electrical instability), nonobstructive RCA disease, EF 15-20%.   . Cerebrovascular accident [facility])    a. 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 Chronic systolic CHF (congestive heart failure) ([place])    a. Likely NICM (out of proportion to CAD). b. 2009 - EF 25-30% by echo, 01/2013: 15-20% by cath. c. 11/2014 Echo: EF 35-40%, Gr1 DD, mild MR, mod TR, PASP 89mmHg.  . CKD (chronic kidney disease), stage II   . Dyslipidemia   . Gout   . HTN (hypertension)   . Hypokalemia   . ICD (implantable cardiac  defibrillator) in place   . Insulin dependent diabetes mellitus ([place])   . Lipoma   . Nonischemic cardiomyopathy ([place])    a.  11/2014 Echo: EF 35-40%, Gr1 DD, mild MR, mod TR, PASP 53mmHg.  . OSA (obstructive sleep apnea)    does not wear cpap  . PAF (paroxysmal atrial fibrillation) ([place])    a. Noted 05/2008 by EKG;  b. CHA2DS2VASc = 5-6-->coumadin.  . Paroxysmal VT ([place])    a. s/p St. Jude ICD 2007. b. H/o paroxysmal VT/VF including VT storm 12/2012 admission prompting amio initiation;  c. 01/2013 ICD upgrade SJM 1411-36Q Ellipse VR single lead ICD.  [name] Pulmonary HTN ([place])    a. Mild by cath 01/2013.   PSH: Past Surgical History:  Procedure Laterality Date  . CARDIAC CATHETERIZATION     Nonobstructive coronary disease 2009  . CARDIAC DEFIBRILLATOR PLACEMENT     ICD-St. Jude  . IMPLANTABLE CARDIOVERTER DEFIBRILLATOR (ICD) GENERATOR CHANGE N/A 01/15/2014   Procedure: ICD GENERATOR CHANGE;  Surgeon: Evans Lance, MD;  Location: [facility] CATH LAB;  Service: Cardiovascular;  Laterality: N/A;  . LEFT AND RIGHT HEART CATHETERIZATION WITH CORONARY ANGIOGRAM N/A 01/03/2013   Procedure: LEFT AND RIGHT HEART CATHETERIZATION WITH CORONARY ANGIOGRAM;  Surgeon: Collier Salina  M Martinique, MD;  Location: [facility] CATH LAB;  Service: Cardiovascular;  Laterality: N/A;  . LIPOMA EXCISION    . RIGHT/LEFT HEART CATH AND CORONARY ANGIOGRAPHY N/A 10/04/2017   Procedure: RIGHT/LEFT HEART CATH AND CORONARY ANGIOGRAPHY;  Surgeon: Larey Dresser, MD;  Location: [place] CV LAB;  Service: Cardiovascular;  Laterality: N/A;  . RIGHT/LEFT HEART CATH AND CORONARY ANGIOGRAPHY N/A 12/06/2018   Procedure: RIGHT/LEFT HEART CATH AND CORONARY ANGIOGRAPHY;  Surgeon: Jolaine Artist, MD;  Location: [place] CV LAB;  Service: Cardiovascular;  Laterality: N/A;     Past Medical History:  Diagnosis Date  . CAD (coronary artery disease)    a. Initial nonobst by cath 2009. b. Cath 01/2013 in setting of VT storm: obstructive distal Cx disease (small  and terminates in the AV groove, unlikely to cause significant ischemia or electrical instability), nonobstructive RCA disease, EF 15-20%.   . Cerebrovascular accident [facility])    a. 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 Chronic systolic CHF (congestive heart failure) ([place])    a. Likely NICM (out of proportion to CAD). b. 2009 - EF 25-30% by echo, 01/2013: 15-20% by cath. c. 11/2014 Echo: EF 35-40%, Gr1 DD, mild MR, mod TR, PASP 32mmHg.  . CKD (chronic kidney disease), stage II   . Dyslipidemia   . Gout   . HTN (hypertension)   . Hypokalemia   . ICD (implantable cardiac defibrillator) in place   . Insulin dependent diabetes mellitus ([place])   . Lipoma   . Nonischemic cardiomyopathy ([place])    a.  11/2014 Echo: EF 35-40%, Gr1 DD, mild MR, mod TR, PASP 66mmHg.  . OSA (obstructive sleep apnea)    does not wear cpap  . PAF (paroxysmal atrial fibrillation) ([place])    a. Noted 05/2008 by EKG;  b. CHA2DS2VASc = 5-6-->coumadin.  . Paroxysmal VT ([place])    a. s/p St. Jude ICD 2007. b. H/o paroxysmal VT/VF including VT storm 12/2012 admission prompting amio initiation;  c. 01/2013 ICD upgrade SJM 1411-36Q Ellipse VR single lead ICD.  [name] Pulmonary HTN ([place])    a. Mild by cath 01/2013.    Medications:   Scheduled: . amiodarone  200 mg Oral BID  . atorvastatin  80 mg Oral q1800  . carvedilol  6.25 mg Oral BID WC  . [START ON 01/03/2019] digoxin  0.125 mg Oral QODAY  . furosemide  80 mg Intravenous BID  . heparin  5,000 Units Subcutaneous Q8H  . [START ON 01/03/2019] insulin aspart  0-9 Units Subcutaneous TID WC  . sodium chloride flush  3 mL Intravenous Once  . sodium chloride flush  3 mL Intravenous Q12H  . Warfarin - Pharmacist Dosing Inpatient   Does not apply q1800    (Not in a hospital admission)   ALLERGIES:  No Known Allergies  FAM HX: Family History  Problem Relation Age of Onset  . Coronary artery disease Mother   . Heart attack Mother   . Heart attack Maternal Uncle   . Heart attack Maternal  Grandmother     Social History:   reports that he quit smoking about 32 years ago. He has never used smokeless tobacco. He reports that he does not drink alcohol or use drugs.  ROS: ROS: all other systems reviewed and are negative except as mentioned in HPI  Blood pressure 106/80, pulse 79, temperature 98.4 F (36.9 C), temperature source Oral, resp. rate (!) 21, SpO2 99 %. PHYSICAL EXAM: Physical Exam  GEN: sitting in bed,  appears uncomfortable HEENT EOMI PERRL muddy sclerae wearing O2 NECK + JVD PULM bilateral crackles, increased WOB, speaking in 3-4 word sentences CV regular rate, + ectopy ABD mildly distended without frank fluid wave EXT 1+ LE edema NEURO AAO x 3 SKIN warm and dry    Results for orders placed or performed during the hospital encounter of 01/02/19 (from the past 48 hour(s))  Basic metabolic panel     Status: Abnormal   Collection Time: 01/02/19  1:35 PM  Result Value Ref Range   Sodium 137 135 - 145 mmol/L   Potassium 4.4 3.5 - 5.1 mmol/L   Chloride 100 98 - 111 mmol/L   CO2 24 22 - 32 mmol/L   Glucose, Bld 156 (H) 70 - 99 mg/dL   BUN 65 (H) 8 - 23 mg/dL   Creatinine, Ser 3.65 (H) 0.61 - 1.24 mg/dL   Calcium 9.5 8.9 - 10.3 mg/dL   GFR calc non Af Amer 17 (L) >60 mL/min   GFR calc Af Amer 19 (L) >60 mL/min   Anion gap 13 5 - 15    Comment: Performed at [facility], [address], [place], [place] [address]  CBC     Status: Abnormal   Collection Time: 01/02/19  1:35 PM  Result Value Ref Range   WBC 6.6 4.0 - 10.5 K/uL   RBC 3.94 (L) 4.22 - 5.81 MIL/uL   Hemoglobin 12.5 (L) 13.0 - 17.0 g/dL   HCT 39.5 39.0 - 52.0 %   MCV 100.3 (H) 80.0 - 100.0 fL   MCH 31.7 26.0 - 34.0 pg   MCHC 31.6 30.0 - 36.0 g/dL   RDW 17.1 (H) 11.5 - 15.5 %   Platelets 190 150 - 400 K/uL   nRBC 0.3 (H) 0.0 - 0.2 %    Comment: Performed at [facility] [address] [place], [place] [address]  Troponin I (High Sensitivity)     Status: Abnormal   Collection  Time: 01/02/19  1:35 PM  Result Value Ref Range   Troponin I (High Sensitivity) 94 (H) <18 ng/L    Comment: (NOTE) Elevated high sensitivity troponin I (hsTnI) values and significant  changes across serial measurements may suggest ACS but many other  chronic and acute conditions are known to elevate hsTnI results.  Refer to the "Links" section for chest pain algorithms and additional  guidance. Performed at [facility], [place] [address], [place], [place] [address]   Digoxin level     Status: Abnormal   Collection Time: 01/02/19  3:22 PM  Result Value Ref Range   Digoxin Level 0.7 (L) 0.8 - 2.0 ng/mL    Comment: Performed at [facility], [place] [address] [place], [place] [address]  Protime-INR     Status: Abnormal   Collection Time: 01/02/19  3:52 PM  Result Value Ref Range   Prothrombin Time 26.6 (H) 11.4 - 15.2 seconds   INR 2.5 (H) 0.8 - 1.2    Comment: (NOTE) INR goal varies based on device and disease states. Performed at [facility], [place] [address], [place], [place] [address]   Brain natriuretic peptide     Status: Abnormal   Collection Time: 01/02/19  3:52 PM  Result Value Ref Range   B Natriuretic Peptide 2,154.7 (H) 0.0 - 100.0 pg/mL    Comment: Performed at [facility] [address] [place], [place] [address]  Troponin I (High Sensitivity)  Status: Abnormal   Collection Time: 01/02/19  3:52 PM  Result Value Ref Range   Troponin I (High Sensitivity) 93 (H) <18 ng/L    Comment: (NOTE) Elevated high sensitivity troponin I (hsTnI) values and significant  changes across serial measurements may suggest ACS but many other  chronic and acute conditions are known to elevate hsTnI results.  Refer to the "Links" section for chest pain algorithms and additional  guidance. Performed at [facility], [place] [address], [place], [place] [address]   SARS Coronavirus 2 [facility] order, Performed in [facility] hospital lab) Nasopharyngeal  Nasopharyngeal Swab     Status: None   Collection Time: 01/02/19  4:37 PM   Specimen: Nasopharyngeal Swab  Result Value Ref Range   SARS Coronavirus 2 NEGATIVE NEGATIVE    Comment: (NOTE) If result is NEGATIVE SARS-CoV-2 target nucleic acids are NOT DETECTED. The SARS-CoV-2 RNA is generally detectable in upper and lower  respiratory specimens during the acute phase of infection. The lowest  concentration of SARS-CoV-2 viral copies this assay can detect is 250  copies / mL. A negative result does not preclude SARS-CoV-2 infection  and should not be used as the sole basis for treatment or other  patient management decisions.  A negative result may occur with  improper specimen collection / handling, submission of specimen other  than nasopharyngeal swab, presence of viral mutation(s) within the  areas targeted by this assay, and inadequate number of viral copies  (<250 copies / mL). A negative result must be combined with clinical  observations, patient history, and epidemiological information. If result is POSITIVE SARS-CoV-2 target nucleic acids are DETECTED. The SARS-CoV-2 RNA is generally detectable in upper and lower  respiratory specimens dur ing the acute phase of infection.  Positive  results are indicative of active infection with SARS-CoV-2.  Clinical  correlation with patient history and other diagnostic information is  necessary to determine patient infection status.  Positive results do  not rule out bacterial infection or co-infection with other viruses. If result is PRESUMPTIVE POSTIVE SARS-CoV-2 nucleic acids MAY BE PRESENT.   A presumptive positive result was obtained on the submitted specimen  and confirmed on repeat testing.  While 2019 novel coronavirus  (SARS-CoV-2) nucleic acids may be present in the submitted sample  additional confirmatory testing may be necessary for epidemiological  and / or clinical management purposes  to differentiate between  SARS-CoV-2  and other Sarbecovirus currently known to infect humans.  If clinically indicated additional testing with an alternate test  methodology [phone]) is advised. The SARS-CoV-2 RNA is generally  detectable in upper and lower respiratory sp ecimens during the acute  phase of infection. The expected result is Negative. Fact Sheet for Patients:  StrictlyIdeas.no Fact Sheet for Healthcare Providers: BankingDealers.co.za This test is not yet approved or cleared by the [place] FDA and has been authorized for detection and/or diagnosis of SARS-CoV-2 by FDA under an Emergency Use Authorization (EUA).  This EUA will remain in effect (meaning this test can be used) for the duration of the COVID-19 declaration under Section 564(b)(1) of the Act, 21 U.S.C. section 360bbb-3(b)(1), unless the authorization is terminated or revoked sooner. Performed at [facility], [place] [address], [place], [place] [address]     Dg Chest 2 View  Result Date: 01/02/2019 CLINICAL DATA:  Chest and epigastric pain and shortness of breath for 1 week. EXAM: CHEST - 2 VIEW COMPARISON:  Single-view of the chest 10/07/2017. FINDINGS: There  is cardiomegaly and vascular congestion. AICD is in place. No consolidative process, pneumothorax or effusion. Atherosclerosis noted. IMPRESSION: No acute disease. Cardiomegaly and pulmonary vascular congestion. Atherosclerosis. Electronically Signed   By: Inge Rise M.D.   On: 01/02/2019 14:19    Assessment/Plan  1.  AKI on CKD: likely a result of poor forward flow in the setting of new Aflutter/ Atach.  Baseline Cr 2.4--> recently 2.99 in our clinic --> 3.65 here today.  Making urine.  I suspect that optimizing heart rhythm followed by diuresis will help get Cr back on track.  Aldactone held-->agree.  IV Lasix initially ordered, held now in favor of possible DC-CV tomorrow.  No indications for dialysis at present.  2.  New onset  Aflutter/Atach: DC-CV possible tomorrow. On digoxin  3.  Acute on chronic systolic CHF: EF 37-44%.  Will likely add back diuresis after DC-CV.  On coreg 6.25 mg BID  4.  H/o VT- on amiodarone 200 mg BID.    5.  Dispo: inpatient  Madelon Lips 01/02/2019, 8:24 PM

## 2019-01-02 NOTE — Consult Note (Addendum)
Cardiology Consultation:   Patient ID: [name]; [id number]; 1956-04-27   Admit date: 01/02/2019 Date of Consult: 01/02/2019  Primary Care Provider: Biagio Borg, MD Primary Cardiologist: Dr. Aundra Dubin, MD/Dr. Haroldine Laws, MD  Patient Profile:   [name] is a 63 y.o. male with a hx of longstandingcardiomyopathy out of proportion to CAD/chronic systolic CHF(EF 68-34% 05/9620), prior VT,s/p St Jude ICD, CAD,CVA,OSA on CPAP, pulmonary HTN on O2 at home, PAFon coumadin,HTN,and CKD stage 3.who is being seen today for the evaluation of fluid volume overload at the request of Dr. Tamala Julian.  History of Present Illness:   [name] is a 63yo M with a hx as stated above who presented to [facility] on 01/02/2019 with progressively worsening shortness of breath since his cardiac catheterization 12/06/2018.  He reports mild shortness of breath at rest however has had progressive, worsening exertional dyspnea.  Prior to his cath, he states he was able to ambulate within his home without much complication but this has significantly worsened since in the last several weeks. Additionally, he reports an episode of chest pressure/pain which resolved with sublingual nitroglycerin.  He had no associated symptoms such as diaphoresis, nausea or vomiting.  He has chronic LE swelling and orthopnea symptoms which he states have been relatively unchanged.  He reports no significant weight increase or significant changes in his diet.  He was last seen by Dr. Lovena Le in the office with a weight of 257lb however states his typical weights range in the 252-254 range.  [name] was 60lb when last seen by Dr. Haroldine Laws 11/17/2018. He was recently told to stop taking his evening dose of torsemide 20 mg in the evening by his nephrologist given his worsening creatinine.  He continued with torsemide 40 mg in the a.m. and 20 mg in the afternoon. His most recent baseline creatinine appears to be in the 1.9- 2.3 range.   [name] was  initially seen and admitted from Dr. Rosezella Florida office 09/2017 with fluid volume overload. Heart failure team was consulted. He underwent a right and left heart cath 10/04/2017. PYP scan and myeloma panel completed that was not suggestive of cardiac amyloidosis.  He diuresed 31 pounds with IV Lasix and metolazone was then transitioned to torsemide 60 mg daily.  Heart failure medicines were optimized.  No ACE/ARB were initiated secondary to CKD and soft BPs.  His discharge weight was 269 pounds.  Since that time he has had multiple hospital admissions for CHF.  He was last seen by Dr. Haroldine Laws 11/17/2018 in the outpatient setting.  His diuretic regimen at that time was torsemide 40 mg in a.m., 20 mg in the afternoon and 20 mg in the evening. He had no complaints of edema, orthopnea or PND. His ICD was interrogated with no episodes of VT/VF.  Thoracic impedance was below May/July but was noted to be improved. His last echocardiogram was 11/17/2018 with an LVEF of 20 to 25%. He was scheduled for an outpatient right and left heart cardiac catheterization performed on 12/06/2018.  LHC showed mid to distal LCx lesion at 50%, pRCA at 20% and mLAD at 30%.  Severe nonischemic cardiomyopathy with an LVEF of 10 to 15% per echocardiogram with severe biventricular dysfunction with moderate PAH and normal left-sided pressures.  Was then seen by Dr. Lovena Le 12/29/2018 for follow-up of VT and chronic systolic heart failure. No additional VT noted since up-titration of Amiodarone. Plan was made for Amiodarone 200 BID until 02/02/2019.    In the  ED, CXR with no acute disease process with cardiomegaly and pulmonary vascular congestion. Creatinine was noted to be elevated above baseline at 3.65 today. High-sensitivity troponin is elevated at 94 with no recurrent anginal symptoms. EKG with atrial flutter HR, 73 with no acute changes.   Past Medical History:  Diagnosis Date  . CAD (coronary artery disease)    a. Initial nonobst by  cath 2009. b. Cath 01/2013 in setting of VT storm: obstructive distal Cx disease (small and terminates in the AV groove, unlikely to cause significant ischemia or electrical instability), nonobstructive RCA disease, EF 15-20%.   . Cerebrovascular accident [facility])    a. 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 Chronic systolic CHF (congestive heart failure) ([place])    a. Likely NICM (out of proportion to CAD). b. 2009 - EF 25-30% by echo, 01/2013: 15-20% by cath. c. 11/2014 Echo: EF 35-40%, Gr1 DD, mild MR, mod TR, PASP 58mmHg.  . CKD (chronic kidney disease), stage II   . Dyslipidemia   . Gout   . HTN (hypertension)   . Hypokalemia   . ICD (implantable cardiac defibrillator) in place   . Insulin dependent diabetes mellitus ([place])   . Lipoma   . Nonischemic cardiomyopathy ([place])    a.  11/2014 Echo: EF 35-40%, Gr1 DD, mild MR, mod TR, PASP 90mmHg.  . OSA (obstructive sleep apnea)    does not wear cpap  . PAF (paroxysmal atrial fibrillation) ([place])    a. Noted 05/2008 by EKG;  b. CHA2DS2VASc = 5-6-->coumadin.  . Paroxysmal VT ([place])    a. s/p St. Jude ICD 2007. b. H/o paroxysmal VT/VF including VT storm 12/2012 admission prompting amio initiation;  c. 01/2013 ICD upgrade SJM [id number] [place] VR single lead ICD.  [name] Pulmonary HTN ([place])    a. Mild by cath 01/2013.    Past Surgical History:  Procedure Laterality Date  . CARDIAC CATHETERIZATION     Nonobstructive coronary disease 2009  . CARDIAC DEFIBRILLATOR PLACEMENT     ICD-St. Jude  . IMPLANTABLE CARDIOVERTER DEFIBRILLATOR (ICD) GENERATOR CHANGE N/A 01/15/2014   Procedure: ICD GENERATOR CHANGE;  Surgeon: Evans Lance, MD;  Location: [facility] CATH LAB;  Service: Cardiovascular;  Laterality: N/A;  . LEFT AND RIGHT HEART CATHETERIZATION WITH CORONARY ANGIOGRAM N/A 01/03/2013   Procedure: LEFT AND RIGHT HEART CATHETERIZATION WITH CORONARY ANGIOGRAM;  Surgeon: Peter M Martinique, MD;  Location: [facility] CATH LAB;  Service: Cardiovascular;  Laterality: N/A;  . LIPOMA EXCISION     . RIGHT/LEFT HEART CATH AND CORONARY ANGIOGRAPHY N/A 10/04/2017   Procedure: RIGHT/LEFT HEART CATH AND CORONARY ANGIOGRAPHY;  Surgeon: Larey Dresser, MD;  Location: [place] CV LAB;  Service: Cardiovascular;  Laterality: N/A;  . RIGHT/LEFT HEART CATH AND CORONARY ANGIOGRAPHY N/A 12/06/2018   Procedure: RIGHT/LEFT HEART CATH AND CORONARY ANGIOGRAPHY;  Surgeon: Jolaine Artist, MD;  Location: [place] CV LAB;  Service: Cardiovascular;  Laterality: N/A;     Prior to Admission medications   Medication Sig Start Date End Date Taking? Authorizing Provider  acetaminophen (TYLENOL) 500 MG tablet Take 500-1,000 mg by mouth every 6 (six) hours as needed (for bilateral knee pain).    [provider]  allopurinol (ZYLOPRIM) 300 MG tablet TAKE 1 TABLET BY MOUTH EVERY DAY 11/28/18   Biagio Borg, MD  amiodarone (PACERONE) 200 MG tablet Take 1 tablet (200 mg total) by mouth daily. 02/02/19   Evans Lance, MD  aspirin EC 81 MG EC tablet Take 1 tablet (81 mg  total) by mouth daily. 08/01/17   Lavina Hamman, MD  atorvastatin (LIPITOR) 80 MG tablet TAKE 1 TABLET BY MOUTH EVERYDAY AT BEDTIME 12/08/18   Biagio Borg, MD  carvedilol (COREG) 25 MG tablet Take 0.5 tablets (12.5 mg total) by mouth 2 (two) times daily with a meal. 09/27/18   Larey Dresser, MD  cholecalciferol (VITAMIN D) 1000 units tablet Take 1,000 Units by mouth daily.    [provider]  digoxin (LANOXIN) 0.125 MG tablet Take 1 tablet (0.125 mg total) by mouth every other day. 11/02/18   Larey Dresser, MD  GLUCOMANNAN PO Take 665 mg by mouth daily as needed (cleansing).     [provider]  Insulin Glargine (LANTUS SOLOSTAR) 100 UNIT/ML Solostar Pen INJECT 10 UNITS INTO THE SKIN AT BEDTIME. 06/09/17   Biagio Borg, MD  Insulin Pen Needle (B-D ULTRAFINE III SHORT PEN) 31G X 8 MM MISC USE ONCE DAILY WITH INSULIN 01/14/18   Biagio Borg, MD  Insulin Pen Needle (PEN NEEDLES) 31G X 5 MM MISC 100 each by Does not  apply route 2 (two) times daily. 11/12/17   Lance Sell, NP  isosorbide-hydrALAZINE (BIDIL) 20-37.5 MG tablet Take 2 tablets by mouth 3 (three) times daily. 11/17/18   , Shaune Pascal, MD  LANTUS SOLOSTAR 100 UNIT/ML Solostar Pen INJECT 10 UNITS INTO THE SKIN AT BEDTIME. 01/14/18   Biagio Borg, MD  nitroGLYCERIN (NITROSTAT) 0.4 MG SL tablet PLACE 1 TABLET UNDER TONGUE EVERY 5 MINS X3 DOSES AS NEEDED FOR CHEST PAIN 08/02/18   Biagio Borg, MD  OXYGEN Inhale into the lungs. 2L continuous; 3L on pulse machine    [provider]  potassium chloride SA (K-DUR,KLOR-CON) 20 MEQ tablet Take 1 tablet (20 mEq total) by mouth daily. 07/19/18   Shirley Friar, PA-C  spironolactone (ALDACTONE) 25 MG tablet Take 1 tablet (25 mg total) by mouth daily. 01/25/18   Georgiana Shore, NP  torsemide (DEMADEX) 20 MG tablet Take 20-40 mg by mouth See admin instructions. Take 40 mg by mouth in the morning, 20 mg at lunch, 20 mg in the PM with add'l 20 mg as needed for bloating    [provider]  traMADol (ULTRAM) 50 MG tablet Take 1 tablet (50 mg total) by mouth every 8 (eight) hours as needed. 01/19/17   Biagio Borg, MD  Turmeric 500 MG CAPS Take 1,000 mg by mouth daily as needed (inflammation).     [provider]  warfarin (COUMADIN) 5 MG tablet Take 1 tablet daily except take 1 1/2 tablets on Mon Wed and Fri or Take as directed by Coumadin clinic. Patient taking differently: Take 5-7.5 mg by mouth See admin instructions. Take 7.5 mg by mouth on Monday, Wednesday and Friday and take 5 mg on Tuesday, Thursday, Saturday and Sunday 09/27/18   Biagio Borg, MD    Inpatient Medications: Scheduled Meds: . sodium chloride flush  3 mL Intravenous Once   Continuous Infusions:  PRN Meds:   Allergies:   No Known Allergies  Social History:   Social History   Socioeconomic History  . Marital status: Married    Spouse name: Not on file  . Number of children: 2  . Years of  education: Not on file  . Highest education level: Not on file  Occupational History  . Occupation: medically retired due to heart failure  Social Needs  . Financial resource strain: Not on file  .  Food insecurity    Worry: Not on file    Inability: Not on file  . Transportation needs    Medical: Yes    Non-medical: No  Tobacco Use  . Smoking status: Former Smoker    Quit date: 05/04/1986    Years since quitting: 32.6  . Smokeless tobacco: Never Used  . Tobacco comment: Quit smoking 30 yrs ago. Smoked as teenager less than 1/2 ppd. Smoked x 4 years.  Substance and Sexual Activity  . Alcohol use: No    Comment: occasionally  . Drug use: No  . Sexual activity: Never  Lifestyle  . Physical activity    Days per week: 0 days    Minutes per session: 0 min  . Stress: Not at all  Relationships  . Social Herbalist on phone: Not on file    Gets together: Not on file    Attends religious service: Not on file    Active member of club or organization: Not on file    Attends meetings of clubs or organizations: Not on file    Relationship status: Not on file  . Intimate partner violence    Fear of current or ex partner: Not on file    Emotionally abused: Not on file    Physically abused: Not on file    Forced sexual activity: Not on file  Other Topics Concern  . Not on file  Social History Narrative  . Not on file    Family History:   Family History  Problem Relation Age of Onset  . Coronary artery disease Mother   . Heart attack Mother   . Heart attack Maternal Uncle   . Heart attack Maternal Grandmother    Family Status:  Family Status  Relation Name Status  . Mother  Deceased  . Father  Deceased       unknown to patient  . Mat Uncle  Deceased  . MGM  Deceased  . MGF  Deceased  . PGM  Deceased  . PGF  Deceased    ROS:  Please see the history of present illness.  All other ROS reviewed and negative.     Physical Exam/Data:   Vitals:   01/02/19 1336  01/02/19 1347  BP: (!) 141/122 102/79  Pulse: 72   Resp: (!) 24 (!) 21  Temp: 98.4 F (36.9 C)   TempSrc: Oral   SpO2: 93% 91%   No intake or output data in the 24 hours ending 01/02/19 1522 There were no vitals filed for this visit. There is no height or weight on file to calculate BMI.   General: Ill-appearing, NAD Neck: Negative for carotid bruits. + JVD Lungs: Bilateral lower lobe crackles. Breathing is mildly labored Cardiovascular: Irregular with S1 S2. + murmurs Abdomen: Soft, non-tender, non-distended. No obvious abdominal masses. Extremities: 1+ BLE edema. No clubbing or cyanosis. DP pulses 1+ bilaterally Neuro: Alert and oriented. No focal deficits. No facial asymmetry. MAE spontaneously. Psych: Responds to questions appropriately with normal affect.    EKG:  The EKG was personally reviewed and demonstrates: 01/02/2019 Atrial flutter with HR 74, no acute changes  Telemetry:  Telemetry was personally reviewed and demonstrates: 01/02/2019 NSR with PVCs, HR 70s  Relevant CV Studies:  [facility] 12/06/2018:  Mid Cx to Dist Cx lesion is 50% stenosed.  Prox RCA lesion is 20% stenosed.  Mid LAD lesion is 30% stenosed.   Findings:  Ao = 115/80 (95) LV =  100/12 RA =  10 RV = 74/14 PA = 73/36 (48) PCW = 9 Fick cardiac output/index = 5.7/2.4 Thermo CO/CI = 4.0/1.7 PVR = 6.8 (Fick) PVR = 9.7 (Thermo) FA sat = 96% PA sat = 65%, 67%  Assessment:  1. Minimal non-obstructive CAD 2. Severe NICM EF 10-15% by echo with severe biventricular dysfunction 3. Moderate PAH with normal left-sided pressures 4. Moderate to severely reduced CO  Plan/Discussion:  Continue medical therapy. Ideally would need advanced therapies but renal function prohibitive.   Glori Bickers, MD  2:59 PM  Echocardiogram 11/17/2018:   1. The left ventricle has severely reduced systolic function, with an ejection fraction of 20-25%. The cavity size was mild to moderately dilated. There is  moderately increased left ventricular wall thickness. Left ventricular diastolic Doppler  parameters are consistent with impaired relaxation. There is right ventricular volume and pressure overload. Left ventricular diffuse hypokinesis.  2. The right ventricle has severely reduced systolic function. The cavity was moderately enlarged. There is no increase in right ventricular wall thickness.  3. Right atrial size was severely dilated.  4. The pericardial effusion is posterior to the left ventricle.  5. Trivial pericardial effusion is present.  6. There is mild dilatation of the ascending aorta.  7. The inferior vena cava was dilated in size with >50% respiratory variability.   V/Q scan 10/07/17:No appreciable ventilation or perfusion defects.  PYP 10/05/17:Visual and quantitative assessment (grade 0/1, H/CLL equal 1.3) are equivocal for transthyretin amyloidosis.  LHC 10/04/17: Left Main  No significant disease.  Left Anterior Descending  Large, wrap-around LAD with 30% mid vessel stenosis.  Left Circumflex  50-60% distal LCx stenosis.  Right Coronary Artery  50% mid RCA stenosis.    [place] 10/04/17: RA mean 19 RV 72/23 PA 74/37, mean 51 PCWP mean 19 LV 111/28 AO 112/80 Oxygen saturations: PA 55% AO 95% Cardiac Output (Fick) 4.68  Cardiac Index (Fick) 1.9  PVR 6.8 WU CVP/PCWP 1 PAPi 1.95 1. Nonischemic cardiomyopathy. Coronary disease well out of proportion to degree of cardiomyopathy.  2. Elevated R>L heart filling pressures, suggestive of significant RV failure with high CVP/PCWP ratio and PAPi <2.  3. Low cardiac output.  4. Mixed pulmonary venous/pulmonary arterial hypertension, ?due to hypoxemia from OSA or OHS/OSA.  Laboratory Data:  Chemistry Recent Labs  Lab 01/02/19 1335  NA 137  K 4.4  CL 100  CO2 24  GLUCOSE 156*  BUN 65*  CREATININE 3.65*  CALCIUM 9.5  GFRNONAA 17*  GFRAA 19*  ANIONGAP 13    Total Protein  Date Value Ref Range Status   11/01/2018 7.1 6.0 - 8.3 g/dL Final  06/28/2017 7.2 6.0 - 8.5 g/dL Final   Albumin  Date Value Ref Range Status  11/01/2018 3.9 3.5 - 5.2 g/dL Final  06/28/2017 4.2 3.6 - 4.8 g/dL Final   AST  Date Value Ref Range Status  11/01/2018 45 (H) 0 - 37 U/L Final   ALT  Date Value Ref Range Status  11/01/2018 42 0 - 53 U/L Final   Alkaline Phosphatase  Date Value Ref Range Status  11/01/2018 146 (H) 39 - 117 U/L Final   Total Bilirubin  Date Value Ref Range Status  11/01/2018 2.4 (H) 0.2 - 1.2 mg/dL Final   Bilirubin Total  Date Value Ref Range Status  06/28/2017 1.3 (H) 0.0 - 1.2 mg/dL Final   Hematology Recent Labs  Lab 01/02/19 1335  WBC 6.6  RBC 3.94*  HGB 12.5*  HCT 39.5  MCV 100.3[facility]  31.7  MCHC 31.6  RDW 17.1*  PLT 190   Cardiac EnzymesNo results for input(s): TROPONINI in the last 168 hours. No results for input(s): TROPIPOC in the last 168 hours.  BNPNo results for input(s): BNP, PROBNP in the last 168 hours.  DDimer No results for input(s): DDIMER in the last 168 hours. TSH:  Lab Results  Component Value Date   TSH 1.14 07/21/2018   Lipids: Lab Results  Component Value Date   CHOL 137 02/08/2018   HDL 33.00 (L) 02/08/2018   LDLCALC 88 02/08/2018   LDLDIRECT 194.6 05/09/2007   TRIG 78.0 02/08/2018   CHOLHDL 4 02/08/2018   HgbA1c: Lab Results  Component Value Date   HGBA1C 6.5 07/21/2018   Radiology/Studies:  Dg Chest 2 View  Result Date: 01/02/2019 CLINICAL DATA:  Chest and epigastric pain and shortness of breath for 1 week. EXAM: CHEST - 2 VIEW COMPARISON:  Single-view of the chest 10/07/2017. FINDINGS: There is cardiomegaly and vascular congestion. AICD is in place. No consolidative process, pneumothorax or effusion. Atherosclerosis noted. IMPRESSION: No acute disease. Cardiomegaly and pulmonary vascular congestion. Atherosclerosis. Electronically Signed   By: Inge Rise M.D.   On: 01/02/2019 14:19   Assessment and Plan:   1.   Chronic systolic heart failure with biventricular dysfunction secondary to nonischemic cardiomyopathy: -Presented with worsening dyspnea on exertion over the last several weeks found to be moderately fluid volume overloaded on exam>>likely in the setting of atrial flutter -Last echocardiogram 11/17/2018 with LV EF of 20 to 93% with diastolic dysfunction and left ventricular diffuse hypokinesis -Right and left heart cath with nonobstructive CAD and severe nonischemic cardiomyopathy with severe biventricular dysfunction, moderate PAH with normal left-sided pressures and moderate to severely reduced cardiac output -Lastest diuretic regimen includes torsemide 40/20>> most recently was taken off evening dose of torsemide secondary to worsening renal function per patient's nephrologist -CHF exacerbation likely in the setting of atrial flutter per EKG -Consider nephrology consultation at this time for fluid volume management and worsening renal function -Decrease carvedilol to 6.25 mg twice daily to allow BP room for diuresis, continue digoxin 0.125 mg -On home BiDil 1 tab 3 times daily and spironolactone 25 mg daily  2.  CKD stage III: -Creatinine, 3.65 today up from 2.68 on 12/06/2018 -Baseline appears to be in the 1.9-2.4 range  -Consider nephrology consultation for diuretic assistance given progressively worsening kidney function -IV Lasix 80 mg twice daily and monitor renal function closely with daily BMET  3.  CAD: -LHC 10/2017 with 50 to 60% distal left LCx and 50% M RCA stenosis and no significant LM or LAD disease -Most recent cardiac cath 12/06/2018 with mild nonobstructive CAD -Had one episode of anginal pain relieved with SL NTG>>> likely secondary to fluid volume overload and atrial flutter, demand ischemia -HST, 94>>> will repeat to assess delta change -Continue statin, beta-blocker -No ASA, on Coumadin for PAF -No acute S/S bleeding  4.  PAF: -EKG with atrial flutter and no acute ischemic  changes, HR stable -On chronic Coumadin, followed by heart care Coumadin clinic -INR from 01/02/2019, 2.5 -May need transition to heparin gtt while inpatient status -ICD rep to interrogate for possible pacing -NPO after MDN for DCCV tomorrow 01/03/2019  5.  History of VT: -[place] ICD in place -Last interrogation with no VT/VF or ICD shocks  6.  Pulmonary hypertension with OSA/CPAP: -On home supplemental O2 2 L -Repeat RHC 12/06/2018 with no significant change from prior -Follows with Dr. Halford Chessman, repeating sleep  study -Continue CPAP   For questions or updates, please contact [place] Please consult [email or web address] for contact info under Cardiology/STEMI.   Lyndel Safe NP-C [facility] Pager: [phone] 01/02/2019 3:22 PM  Patient seen and examined with the above-signed Advanced Practice Provider and/or Housestaff. I personally reviewed laboratory data, imaging studies and relevant notes. I independently examined the patient and formulated the important aspects of the plan. I have edited the note to reflect any of my changes or salient points. I have personally discussed the plan with the patient and/or family.  63 y/o male with h/o severe systolic HF due to NICM EF 20-25%, PAH, CKD 3-4 and PAF well known to me form HF Clinic. Recently underwent R/L cath for worsening HF symptoms. Cath with normal coronaries and moderate PAH with normal PCWP and mildly decreasesde CO.   Over the last week has had worsening HF symptoms and fatigue. Presented to ER. Found to have A/CKD with creatinine up to 3.65 ECG also shows new AFL/Atach  On exam Weak appearing NAD HEENT: normal Neck: supple. JVP 9-10. Carotids 2+ bilat; no bruits. No lymphadenopathy or thryomegaly appreciated. Cor: PMI nondisplaced. Irregular rate & rhythm. No rubs, gallops or murmurs. Lungs: clear Abdomen: obese soft, nontender, nondistended. No hepatosplenomegaly. No bruits or masses. Good bowel sounds.  Extremities: no cyanosis, clubbing, rash, edema Neuro: alert & orientedx3, cranial nerves grossly intact. moves all 4 extremities w/o difficulty. Affect pleasant  I suspect he has low output in setting of new onset AFL/Atach. Volume status not too bad. Would hold off on diuresis for now. INR 2.5. Continue warfarin. Plan DC-CV tomorrow. Watch closely for need for inotropes. ICD interrogated personally with rep. Single lead device. No VT/VF. Unable to assess for AF.   Glori Bickers, MD  8:42 PM

## 2019-01-02 NOTE — ED Notes (Signed)
Lab called and needs a blue tube

## 2019-01-02 NOTE — H&P (Addendum)
History and Physical    [name] NFA:[id number] DOB: [birth year] DOA: 01/02/2019  Referring MD/NP/PA:  PCP: Biagio Borg, MD   Patient coming from: Home  Chief Complaint: Shortness of breath and chest pain  I have personally briefly reviewed patient's old medical records in [place]   HPI: [name] is a 63 y.o. male with medical history significant of HTN, systolic CHF last EF 20 to 25%, s/p ICD,  VT, CAD, CVA, OSA on CPAP, and CKD stage III; who presents with progressively worsening shortness of breath and intermittent chest pain.  Shortness of breath has progressively worsened to the point where he now complains of being short of breath even at rest.  He reports having intermittent left-sided chest pains that he rates as a 3 out of 10 on the pain scale.  Chest pains sometimes go away on their own, but sometimes reports having to take a nitroglycerin tablet to relieve pain.  He just recently underwent cardiac catheterization on 12/06/2018 which revealed no significant coronary artery disease, but did have mid to distal left circumflex stenosis of 50%.  At that time his EF was noted to be around 10 to 15% with severe biventricular dysfunction and moderate pulmonary hypertension.  He notes some mild lower extremity swelling, but denies any significant change in his weight.  Denies having any nausea, vomiting, diaphoresis, loss of consciousness, or recent falls.  Patient had just recently followed up with Dr. Lovena Le of cardiology 4 days ago to follow-up on history of VT and chronic systolic heart failure.  His torsemide dose had been decreased by his nephrologist due to worsening kidney function.  He was recommended to continue on amiodarone 200 mg twice daily until October 1.  Patient's only other complaint includes left upper quadrant abdominal pain   ED Course: Admission into the emergency department patient was noted to be afebrile, respiration 18-26, blood pressures 85/72 141/22,  and O2 saturations maintained on 2 L of nasal cannula oxygen.  Labs significant for BNP 2154.7, high-sensitivity troponin  94, and INR 2.5.  X-ray revealed cardiomegaly and pulmonary vascular congestion without overt edema.  COVID 19 screening was negative.  Review of Systems  Constitutional: Negative for fever.  HENT: Negative for congestion and nosebleeds.   Eyes: Negative for double vision and photophobia.  Respiratory: Positive for cough and shortness of breath.   Cardiovascular: Positive for chest pain and leg swelling.  Gastrointestinal: Positive for abdominal pain. Negative for vomiting.  Genitourinary: Negative for dysuria and hematuria.  Musculoskeletal: Negative for falls.  Neurological: Negative for focal weakness and loss of consciousness.  Psychiatric/Behavioral: Negative for memory loss and substance abuse.    Past Medical History:  Diagnosis Date  . CAD (coronary artery disease)    a. Initial nonobst by cath 2009. b. Cath 01/2013 in setting of VT storm: obstructive distal Cx disease (small and terminates in the AV groove, unlikely to cause significant ischemia or electrical instability), nonobstructive RCA disease, EF 15-20%.   . Cerebrovascular accident [facility])    a. 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 Chronic systolic CHF (congestive heart failure) ([place])    a. Likely NICM (out of proportion to CAD). b. 2009 - EF 25-30% by echo, 01/2013: 15-20% by cath. c. 11/2014 Echo: EF 35-40%, Gr1 DD, mild MR, mod TR, PASP 2mmHg.  . CKD (chronic kidney disease), stage II   . Dyslipidemia   . Gout   . HTN (hypertension)   . Hypokalemia   .  ICD (implantable cardiac defibrillator) in place   . Insulin dependent diabetes mellitus ([place])   . Lipoma   . Nonischemic cardiomyopathy ([place])    a.  11/2014 Echo: EF 35-40%, Gr1 DD, mild MR, mod TR, PASP 19mmHg.  . OSA (obstructive sleep apnea)    does not wear cpap  . PAF (paroxysmal atrial fibrillation) ([place])    a. Noted 05/2008 by EKG;  b.  CHA2DS2VASc = 5-6-->coumadin.  . Paroxysmal VT ([place])    a. s/p St. Jude ICD 2007. b. H/o paroxysmal VT/VF including VT storm 12/2012 admission prompting amio initiation;  c. 01/2013 ICD upgrade SJM [id number] Ellipse VR single lead ICD.  [name] Pulmonary HTN ([place])    a. Mild by cath 01/2013.    Past Surgical History:  Procedure Laterality Date  . CARDIAC CATHETERIZATION     Nonobstructive coronary disease 2009  . CARDIAC DEFIBRILLATOR PLACEMENT     ICD-St. Jude  . IMPLANTABLE CARDIOVERTER DEFIBRILLATOR (ICD) GENERATOR CHANGE N/A 01/15/2014   Procedure: ICD GENERATOR CHANGE;  Surgeon: Evans Lance, MD;  Location[facility] CATH LAB;  Service: Cardiovascular;  Laterality: N/A;  . LEFT AND RIGHT HEART CATHETERIZATION WITH CORONARY ANGIOGRAM N/A 01/03/2013   Procedure: LEFT AND RIGHT HEART CATHETERIZATION WITH CORONARY ANGIOGRAM;  Surgeon: Peter M Martinique, MD;  Location: [facility] CATH LAB;  Service: Cardiovascular;  Laterality: N/A;  . LIPOMA EXCISION    . RIGHT/LEFT HEART CATH AND CORONARY ANGIOGRAPHY N/A 10/04/2017   Procedure: RIGHT/LEFT HEART CATH AND CORONARY ANGIOGRAPHY;  Surgeon: Larey Dresser, MD;  Location: [facility] CV LAB;  Service: Cardiovascular;  Laterality: N/A;  . RIGHT/LEFT HEART CATH AND CORONARY ANGIOGRAPHY N/A 12/06/2018   Procedure: RIGHT/LEFT HEART CATH AND CORONARY ANGIOGRAPHY;  Surgeon: Jolaine Artist, MD;  Location: [place] CV LAB;  Service: Cardiovascular;  Laterality: N/A;     reports that he quit smoking about 32 years ago. He has never used smokeless tobacco. He reports that he does not drink alcohol or use drugs.  No Known Allergies  Family History  Problem Relation Age of Onset  . Coronary artery disease Mother   . Heart attack Mother   . Heart attack Maternal Uncle   . Heart attack Maternal Grandmother     Prior to Admission medications   Medication Sig Start Date End Date Taking? Authorizing Provider  acetaminophen (TYLENOL) 500 MG tablet Take 500-1,000 mg by mouth  every 6 (six) hours as needed (for bilateral knee pain).   Yes [provider]  allopurinol (ZYLOPRIM) 300 MG tablet TAKE 1 TABLET BY MOUTH EVERY DAY Patient taking differently: Take 300 mg by mouth daily.  11/28/18  Yes Biagio Borg, MD  amiodarone (PACERONE) 200 MG tablet Take 1 tablet (200 mg total) by mouth daily. Patient taking differently: Take 200 mg by mouth 2 (two) times daily.  02/02/19  Yes Evans Lance, MD  aspirin EC 81 MG EC tablet Take 1 tablet (81 mg total) by mouth daily. 08/01/17  Yes Lavina Hamman, MD  atorvastatin (LIPITOR) 80 MG tablet TAKE 1 TABLET BY MOUTH EVERYDAY AT BEDTIME Patient taking differently: Take 80 mg by mouth daily at 6 PM.  12/08/18  Yes Biagio Borg, MD  carvedilol (COREG) 25 MG tablet Take 0.5 tablets (12.5 mg total) by mouth 2 (two) times daily with a meal. 09/27/18  Yes Larey Dresser, MD  cholecalciferol (VITAMIN D) 1000 units tablet Take 1,000 Units by mouth daily.   Yes [provider]  digoxin Fonnie Birkenhead)  0.125 MG tablet Take 1 tablet (0.125 mg total) by mouth every other day. 11/02/18  Yes Larey Dresser, MD  GLUCOMANNAN PO Take 665 mg by mouth daily as needed (cleansing).    Yes [provider]  Insulin Glargine (LANTUS SOLOSTAR) 100 UNIT/ML Solostar Pen INJECT 10 UNITS INTO THE SKIN AT BEDTIME. Patient taking differently: Inject 10 Units into the skin at bedtime. INJECT 10 UNITS INTO THE SKIN AT BEDTIME. 06/09/17  Yes Biagio Borg, MD  isosorbide-hydrALAZINE (BIDIL) 20-37.5 MG tablet Take 2 tablets by mouth 3 (three) times daily. 11/17/18  Yes Bensimhon, Shaune Pascal, MD  nitroGLYCERIN (NITROSTAT) 0.4 MG SL tablet PLACE 1 TABLET UNDER TONGUE EVERY 5 MINS X3 DOSES AS NEEDED FOR CHEST PAIN Patient taking differently: Place 0.4 mg under the tongue every 5 (five) minutes as needed for chest pain.  08/02/18  Yes Biagio Borg, MD  OXYGEN Inhale into the lungs. 2L continuous; 3L on pulse machine   Yes [provider]   potassium chloride SA (K-DUR,KLOR-CON) 20 MEQ tablet Take 1 tablet (20 mEq total) by mouth daily. 07/19/18  Yes Shirley Friar, PA-C  spironolactone (ALDACTONE) 25 MG tablet Take 1 tablet (25 mg total) by mouth daily. 01/25/18  Yes Georgiana Shore, NP  torsemide (DEMADEX) 20 MG tablet Take 20-40 mg by mouth See admin instructions. Take 20 mg by mouth in the morning, 20 mg at lunch,  with add'l 20 mg as needed for bloating in the pm   Yes [provider]  traMADol (ULTRAM) 50 MG tablet Take 1 tablet (50 mg total) by mouth every 8 (eight) hours as needed. 01/19/17  Yes Biagio Borg, MD  Turmeric 500 MG CAPS Take 1,000 mg by mouth daily at 12 noon.    Yes [provider]  warfarin (COUMADIN) 5 MG tablet Take 1 tablet daily except take 1 1/2 tablets on Mon Wed and Fri or Take as directed by Coumadin clinic. Patient taking differently: Take 5-7.5 mg by mouth See admin instructions. Take 7.5 mg by mouth on Monday, Wednesday and Friday and take 5 mg on Tuesday, Thursday, Saturday and Sunday in the evening 09/27/18  Yes Biagio Borg, MD  Insulin Pen Needle (B-D ULTRAFINE III SHORT PEN) 31G X 8 MM MISC USE ONCE DAILY WITH INSULIN 01/14/18   Biagio Borg, MD  Insulin Pen Needle (PEN NEEDLES) 31G X 5 MM MISC 100 each by Does not apply route 2 (two) times daily. 11/12/17   Shambley, Delphia Grates, NP  LANTUS SOLOSTAR 100 UNIT/ML Solostar Pen INJECT 10 UNITS INTO THE SKIN AT BEDTIME. Patient not taking: Reported on 01/02/2019 01/14/18   Biagio Borg, MD    Physical Exam:  Constitutional: Obese male who appears to be in no acute distress at this time Vitals:   01/02/19 1543 01/02/19 1545 01/02/19 1615 01/02/19 1630  BP: 108/83 109/84 110/82 119/81  Pulse: 79 75 80 79  Resp:  (!) 21 (!) 26 20  Temp:      TempSrc:      SpO2:  99% 97% 99%   Eyes: PERRL, lids and conjunctivae normal ENMT: Mucous membranes are dry. Posterior pharynx clear of any exudate or lesions.  Neck: normal, with  positive JVD present. Respiratory:Mildly tachypneic on 3 L nasal cannula oxygen at this time.  Bibasilar crackles noted, but no significant wheezes appreciated. Cardiovascular: regularly irregular with positive systolic murmur appreciated.  +1 pitting bilateral lower extremity edema. 2+ pedal pulses. No carotid bruits.  Abdomen: no tenderness, no masses palpated. No hepatosplenomegaly. Bowel sounds positive.  Musculoskeletal: no clubbing / cyanosis. No joint deformity upper and lower extremities. Good ROM, no contractures. Normal muscle tone.  Skin: no rashes, lesions, ulcers. No induration Neurologic: CN 2-12 grossly intact. Sensation intact, DTR normal. Strength 5/5 in all 4.  Psychiatric: Normal judgment and insight. Alert and oriented x 3. Normal mood.     Labs on Admission: I have personally reviewed following labs and imaging studies  CBC: Recent Labs  Lab 01/02/19 1335  WBC 6.6  HGB 12.5*  HCT 39.5  MCV 100.3*  PLT 841   Basic Metabolic Panel: Recent Labs  Lab 01/02/19 1335  NA 137  K 4.4  CL 100  CO2 24  GLUCOSE 156*  BUN 65*  CREATININE 3.65*  CALCIUM 9.5   GFR: Estimated Creatinine Clearance: 26.9 mL/min (A) (by C-G formula based on SCr of 3.65 mg/dL (H)). Liver Function Tests: No results for input(s): AST, ALT, ALKPHOS, BILITOT, PROT, ALBUMIN in the last 168 hours. No results for input(s): LIPASE, AMYLASE in the last 168 hours. No results for input(s): AMMONIA in the last 168 hours. Coagulation Profile: Recent Labs  Lab 01/02/19 1552  INR 2.5*   Cardiac Enzymes: No results for input(s): CKTOTAL, CKMB, CKMBINDEX, TROPONINI in the last 168 hours. BNP (last 3 results) Recent Labs    11/01/18 1229  PROBNP 2,158.0*   HbA1C: No results for input(s): HGBA1C in the last 72 hours. CBG: No results for input(s): GLUCAP in the last 168 hours. Lipid Profile: No results for input(s): CHOL, HDL, LDLCALC, TRIG, CHOLHDL, LDLDIRECT in the last 72 hours. Thyroid  Function Tests: No results for input(s): TSH, T4TOTAL, FREET4, T3FREE, THYROIDAB in the last 72 hours. Anemia Panel: No results for input(s): VITAMINB12, FOLATE, FERRITIN, TIBC, IRON, RETICCTPCT in the last 72 hours. Urine analysis:    Component Value Date/Time   COLORURINE YELLOW 07/21/2018 1114   APPEARANCEUR CLEAR 07/21/2018 1114   LABSPEC 1.010 07/21/2018 1114   PHURINE 6.0 07/21/2018 1114   GLUCOSEU NEGATIVE 07/21/2018 1114   HGBUR NEGATIVE 07/21/2018 1114   BILIRUBINUR NEGATIVE 07/21/2018 1114   KETONESUR NEGATIVE 07/21/2018 1114   PROTEINUR NEGATIVE 11/16/2014 1042   UROBILINOGEN 1.0 07/21/2018 1114   NITRITE NEGATIVE 07/21/2018 1114   LEUKOCYTESUR NEGATIVE 07/21/2018 1114   Sepsis Labs: No results found for this or any previous visit (from the past 240 hour(s)).   Radiological Exams on Admission: Dg Chest 2 View  Result Date: 01/02/2019 CLINICAL DATA:  Chest and epigastric pain and shortness of breath for 1 week. EXAM: CHEST - 2 VIEW COMPARISON:  Single-view of the chest 10/07/2017. FINDINGS: There is cardiomegaly and vascular congestion. AICD is in place. No consolidative process, pneumothorax or effusion. Atherosclerosis noted. IMPRESSION: No acute disease. Cardiomegaly and pulmonary vascular congestion. Atherosclerosis. Electronically Signed   By: Inge Rise M.D.   On: 01/02/2019 14:19    EKG: Independently reviewed.  Atrial flutter at 74 bpm.  Assessment/Plan Systolic heart failure exacerbation: Acute on chronic.  Patient presents with progressively worsening shortness of breath.  On physical exam 1+ edema noted.  BNP elevated at 2154.7 which is higher than previous.  EF noted to be around 20 to 25% by echocardiogram on 11/17/2018 and 10-15% by echo following cardiac cath.  -Admit to a telemetry bed -Heart failure orders set  initiated  -Strict I&Os and daily weights -Elevate lower extremities -Lasix 80 mg IV twice daily unless another recommendation given by  nephrology -Cardiology consulted,  will follow-up for further recommendations  Acute kidney injury superimposed on chronic kidney disease stage III.  Creatinine acutely elevated up to 3.65 with BUN 65.  Patient's baseline creatinine previously noted to be around 2.4-2.6.  Followed by Dr.Nobile and nephrology in outpatient setting.  -Check urine sodium, urine creatinine, urine urea -Check urinalysis -Nephrology consulted Dr. Hollie Salk, we will follow-up for further recommendation  Chronic respiratory failure with hypoxia: Patient normally on 2 L of nasal cannula oxygen at home.  -Continuous pulse oximetry with nasal cannula oxygen -albuterol inhaler as needed  Chest pain, elevated troponin: Acute.  High-sensitivity troponin is mildly elevated at 94 and 93.  Suspect this is secondary to demand especially with recent negative heart cath. -Continue to monitor  Paroxysmal atrial fibrillation/atrial flutter on chronic anticoagulation: INR therapeutic at 2.5.  -Continue amiodarone and digoxin -Coumadin per pharmacy -N.p.o. after midnight for possible need of DCCV in a.m.  History of VT s/p ICD: Patient status post ICD.  No recent events noted after interrogation. -Continue to monitor  Insulin-dependent diabetes mellitus: Last hemoglobin A1c noted to be 6.5 on 07/21/2018.  On admission glucose 156.  Patient appears to be well controlled. -Hypoglycemic protocol -Hold home 10 units of Lantus nightly for need of procedure in a.m. -CBGs q. before meals and at bedtime with sensitive SSI  CAD: Patient with recent left heart cath on 12/06/2018 showing nonobstructive coronary artery disease.  Essential hypertension  -Continue Coreg  Hyperlipidemia -Continue atorvastatin  Macrocytic anemia: Acute.  Hemoglobin 12.5 with elevated MCV of 100.3.  Suspect dilutional effect from patient being fluid overloaded. -Recheck BC in a.m.  OSA on CPAP -CPAP at night per RT  DVT prophylaxis:lovenox Code Status: full   Family Communication: Will update family over the phone Disposition Plan:  TBD  Consults called:  cardiology Admission status: Inpatient  Norval Morton MD [facility] Pager [phone]   If 7PM-7AM, please contact night-coverage [email or web address] Password [facility]  01/02/2019, 4:58 PM

## 2019-01-03 ENCOUNTER — Encounter (HOSPITAL_COMMUNITY)
Admission: EM | Disposition: A | Discharge status: Home / Self Care | Payer: Self-pay | Source: Home / Self Care | Attending: Internal Medicine

## 2019-01-03 ENCOUNTER — Inpatient Hospital Stay (HOSPITAL_COMMUNITY): Payer: Medicare Other | Admitting: Anesthesiology

## 2019-01-03 ENCOUNTER — Ambulatory Visit: Payer: Medicare Other

## 2019-01-03 ENCOUNTER — Encounter (HOSPITAL_COMMUNITY): Payer: Self-pay | Admitting: *Deleted

## 2019-01-03 HISTORY — PX: CARDIOVERSION: SHX1299

## 2019-01-03 LAB — BASIC METABOLIC PANEL
Anion gap: 10 (ref 5–15)
BUN: 66 mg/dL — ABNORMAL HIGH (ref 8–23)
CO2: 25 mmol/L (ref 22–32)
Calcium: 9.1 mg/dL (ref 8.9–10.3)
Chloride: 102 mmol/L (ref 98–111)
Creatinine, Ser: 3.44 mg/dL — ABNORMAL HIGH (ref 0.61–1.24)
GFR calc Af Amer: 21 mL/min — ABNORMAL LOW (ref >60)
GFR calc non Af Amer: 18 mL/min — ABNORMAL LOW (ref >60)
Glucose, Bld: 113 mg/dL — ABNORMAL HIGH (ref 70–99)
Potassium: 4.4 mmol/L (ref 3.5–5.1)
Sodium: 137 mmol/L (ref 135–145)

## 2019-01-03 LAB — GLUCOSE, CAPILLARY
Glucose-Capillary: 116 mg/dL — ABNORMAL HIGH (ref 70–99)
Glucose-Capillary: 100 mg/dL — ABNORMAL HIGH (ref 70–99)
Glucose-Capillary: 101 mg/dL — ABNORMAL HIGH (ref 70–99)
Glucose-Capillary: 82 mg/dL (ref 70–99)
Glucose-Capillary: 132 mg/dL — ABNORMAL HIGH (ref 70–99)
Glucose-Capillary: 172 mg/dL — ABNORMAL HIGH (ref 70–99)

## 2019-01-03 LAB — PROTIME-INR
INR: 3.8 — ABNORMAL HIGH (ref 0.8–1.2)
Prothrombin Time: 37.2 seconds — ABNORMAL HIGH (ref 11.4–15.2)

## 2019-01-03 LAB — MAGNESIUM: Magnesium: 2.5 mg/dL — ABNORMAL HIGH (ref 1.7–2.4)

## 2019-01-03 SURGERY — CARDIOVERSION
Anesthesia: General

## 2019-01-03 MED ORDER — ETOMIDATE 2 MG/ML IV SOLN
INTRAVENOUS | Status: DC | PRN
  Administered 2019-01-03: 8 mg via INTRAVENOUS

## 2019-01-03 MED ORDER — PROPOFOL 10 MG/ML IV BOLUS
INTRAVENOUS | Status: DC | PRN
  Administered 2019-01-03: 20 mg via INTRAVENOUS

## 2019-01-03 MED ORDER — SODIUM CHLORIDE 0.9 % IV SOLN
INTRAVENOUS | Status: DC | PRN
  Administered 2019-01-03: 14:00:00 via INTRAVENOUS

## 2019-01-03 MED ORDER — DEXTROSE-NACL 5-0.45 % IV SOLN: INTRAVENOUS | Status: AC

## 2019-01-03 MED ORDER — LIDOCAINE 2% (20 MG/ML) 5 ML SYRINGE
INTRAMUSCULAR | Status: DC | PRN
  Administered 2019-01-03: 40 mg via INTRAVENOUS

## 2019-01-03 MED ORDER — PROMETHAZINE HCL 25 MG/ML IJ SOLN: 6.2500 mg | INTRAMUSCULAR | Status: DC | PRN

## 2019-01-03 NOTE — Progress Notes (Signed)
RT Note:  Patient does not wear CPAP for OSA. RT will continue to monitor.

## 2019-01-03 NOTE — Anesthesia Postprocedure Evaluation (Signed)
Anesthesia Post Note  Patient: [name]  Procedure(s) Performed: CARDIOVERSION (N/A )     Patient location during evaluation: PACU Anesthesia Type: General Level of consciousness: awake and alert and oriented Pain management: pain level controlled Vital Signs Assessment: post-procedure vital signs reviewed and stable Respiratory status: spontaneous breathing, nonlabored ventilation and respiratory function stable Cardiovascular status: blood pressure returned to baseline Postop Assessment: no apparent nausea or vomiting Anesthetic complications: no    Last Vitals:  Vitals:   01/03/19 1415 01/03/19 1430  BP: 94/70 111/86  Pulse: 68   Resp: (!) 22   Temp:    SpO2: 100%     Last Pain:  Vitals:   01/03/19 1430  TempSrc:   PainSc: 0-No pain                 Brennan Bailey

## 2019-01-03 NOTE — Transfer of Care (Signed)
Immediate Anesthesia Transfer of Care Note  Patient: [name]  Procedure(s) Performed: CARDIOVERSION (N/A )  Patient Location: PACU and Endoscopy Unit  Anesthesia Type:General  Level of Consciousness: drowsy  Airway & Oxygen Therapy: Patient Spontanous Breathing and Patient connected to nasal cannula oxygen  Post-op Assessment: Report given to RN and Post -op Vital signs reviewed and stable  Post vital signs: Reviewed  Last Vitals:  Vitals Value Taken Time  BP 114/91 01/03/19 1401  Temp    Pulse 69 01/03/19 1404  Resp 32 01/03/19 1404  SpO2 98 % 01/03/19 1404  Vitals shown include unvalidated device data.  Last Pain:  Vitals:   01/03/19 1256  TempSrc:   PainSc: 0-No pain         Complications: No apparent anesthesia complications

## 2019-01-03 NOTE — Anesthesia Preprocedure Evaluation (Addendum)
Anesthesia Evaluation  Patient identified by MRN, date of birth, ID band Patient awake    Reviewed: Allergy & Precautions, NPO status , Patient's Chart, lab work & pertinent test results, reviewed documented beta blocker date and time   History of Anesthesia Complications Negative for: history of anesthetic complications  Airway Mallampati: II  TM Distance: >3 FB Neck ROM: Full    Dental  (+) Edentulous Upper, Edentulous Lower   Pulmonary sleep apnea , former smoker,    Pulmonary exam normal        Cardiovascular hypertension, Pt. on medications and Pt. on home beta blockers + CAD and +CHF  Normal cardiovascular exam+ dysrhythmias (on Coumadin) Atrial Fibrillation + pacemaker + Cardiac Defibrillator   TTE 11/17/18: EF 20-25%, RV volume and pressure overload, left ventricular diffuse hypokinesis, severely reduced RV systolic function, severe RAE    Neuro/Psych CVA    GI/Hepatic negative GI ROS, Neg liver ROS,   Endo/Other  diabetes, Type 2, Insulin Dependent  Renal/GU Renal InsufficiencyRenal disease     Musculoskeletal  (+) Arthritis ,   Abdominal   Peds  Hematology negative hematology ROS (+)   Anesthesia Other Findings Day of surgery medications reviewed with the patient.  Reproductive/Obstetrics                            Anesthesia Physical Anesthesia Plan  ASA: IV  Anesthesia Plan: General   Post-op Pain Management:    Induction: Intravenous  PONV Risk Score and Plan: Treatment may vary due to age or medical condition and Propofol infusion  Airway Management Planned: Mask  Additional Equipment: None  Intra-op Plan:   Post-operative Plan:   Informed Consent: I have reviewed the patients History and Physical, chart, labs and discussed the procedure including the risks, benefits and alternatives for the proposed anesthesia with the patient or authorized representative who has  indicated his/her understanding and acceptance.     Dental advisory given  Plan Discussed with: CRNA  Anesthesia Plan Comments:        Anesthesia Quick Evaluation

## 2019-01-03 NOTE — Anesthesia Procedure Notes (Signed)
Procedure Name: MAC Date/Time: 01/03/2019 1:46 PM Performed by: Janene Harvey, CRNA Oxygen Delivery Method: Ambu bag Dental Injury: Teeth and Oropharynx as per pre-operative assessment

## 2019-01-03 NOTE — Progress Notes (Signed)
[name] for warfarin Indication: atrial fibrillation  No Known Allergies  Patient Measurements: Height: 5\' 10"  (177.8 cm) Weight: 249 lb 3.2 oz (113 kg)(scale a) IBW/kg (Calculated) : 73  Vital Signs: Temp: 98.4 F (36.9 C) (09/01 0720) Temp Source: Oral (09/01 0720) BP: 100/78 (09/01 0824) Pulse Rate: 85 (09/01 0824)  Labs: Recent Labs    01/02/19 1335 01/02/19 1552 01/03/19 0510  HGB 12.5*  --   --   HCT 39.5  --   --   PLT 190  --   --   LABPROT  --  26.6* 37.2*  INR  --  2.5* 3.8*  CREATININE 3.65*  --  3.44*  TROPONINIHS 94* 93*  --     Estimated Creatinine Clearance: 28 mL/min (A) (by C-G formula based on SCr of 3.44 mg/dL (H)).    Assessment: 17 yoM admitted for volume overload. On warfarin PTA for PAF, PTA dose is 7.5 mg on MWF, 5 mg on all other days. Last dose taken 8/30 at unknown time. INR on arrival is therapeutic at 2.5. Also on amio 200 mg PO daily PTA, continued while admitted  INR elevated today  Goal of Therapy:  INR 2-3 Monitor platelets by anticoagulation protocol: Yes   Plan:  Hold warfarin DC sq heparin for now Monitor daily INR and s/sx of bleeding  Thank you Anette Guarneri, PharmD [phone] 01/03/2019,9:28 AM

## 2019-01-03 NOTE — TOC Initial Note (Signed)
Transition of Care [facility]) - Initial/Assessment Note    Patient Details  Name: [name] MRN: [id number] Date of Birth: January 04, 1956  Transition [facility]) CM/SW Contact:    Zenon Mayo, RN Phone Number: 01/03/2019, 10:00 AM  Clinical Narrative:                 From home with spouse, NCM offered choice for [facility] for CHF screen, he states he does not want a HHRN at this time.    Expected Discharge Plan: Home/Self Care Barriers to Discharge: No Barriers Identified   Patient Goals and CMS Choice Patient states their goals for this hospitalization and ongoing recovery are:: rest CMS Medicare.gov Compare Post Acute Care list provided to:: Patient Choice offered to / list presented to : Patient  Expected Discharge Plan and Services Expected Discharge Plan: Home/Self Care In-house Referral: NA Discharge Planning Services: CM Consult Post Acute Care Choice: NA Living arrangements for the past 2 months: Single Family Home Expected Discharge Date: 01/05/19               DME Arranged: (NA)         HH Arranged: RN, Patient Refused HH          Prior Living Arrangements/Services Living arrangements for the past 2 months: Single Family Home Lives with:: Spouse Patient language and need for interpreter reviewed:: Yes Do you feel safe going back to the place where you live?: Yes      Need for Family Participation in Patient Care: No (Comment) Care giver support system in place?: No (comment)   Criminal Activity/Legal Involvement Pertinent to Current Situation/Hospitalization: No - Comment as needed  Activities of Daily Living Home Assistive Devices/Equipment: Cane (specify quad or straight) ADL Screening (condition at time of admission) Patient's cognitive ability adequate to safely complete daily activities?: Yes Is the patient deaf or have difficulty hearing?: No Does the patient have difficulty seeing, even when wearing glasses/contacts?: No Does the patient  have difficulty concentrating, remembering, or making decisions?: No Patient able to express need for assistance with ADLs?: Yes Does the patient have difficulty dressing or bathing?: No Independently performs ADLs?: Yes (appropriate for developmental age) Does the patient have difficulty walking or climbing stairs?: Yes Weakness of Legs: None Weakness of Arms/Hands: None  Permission Sought/Granted                  Emotional Assessment       Orientation: : Oriented to Self, Oriented to Place, Oriented to  Time, Oriented to Situation Alcohol / Substance Use: Not Applicable Psych Involvement: No (comment)  Admission diagnosis:  SOB/Cp Patient Active Problem List   Diagnosis Date Noted  . Systolic congestive heart failure ([place]) 01/02/2019  . Acute kidney injury superimposed on chronic kidney disease ([place]) 01/02/2019  . Chronic respiratory failure ([place]) 07/21/2018  . Degenerative arthritis of knee, bilateral 2020/02/818  . Essential hypertension 10/01/2017  . Wellness examination 08/07/2017  . Obesity (BMI 35.0-39.9 without comorbidity) 08/03/2017  . Dyslipidemia 08/03/2017  . CKD (chronic kidney disease), stage III ([place]) 08/03/2017  . Ischemic chest pain ([place]) 07/27/2017  . Hypoxia 09/22/2016  . Tear of MCL (medial collateral ligament) of knee, left, initial encounter 08/21/2016  . Degenerative arthritis of left knee 08/21/2016  . Morbid obesity ([place]) 12/05/2014  . Type 2 diabetes mellitus ([place]) 11/21/2014  . OSA (obstructive sleep apnea) 11/21/2014  . Encounter for therapeutic drug monitoring 06/23/2013  . PAF (paroxysmal atrial fibrillation) ([place]) 12/30/2012  .  Nonischemic cardiomyopathy ([place])   . Ventricular tachycardia ([place]) 11/16/2012  . Hearing loss, left 03/18/2012  . Dizziness 12/22/2011  . Obesity 01/16/2011  . Long term (current) use of anticoagulants 08/05/2010  . Automatic implantable cardioverter-defibrillator in situ 08/15/2009  . FATIGUE 06/17/2009  .  Coronary atherosclerosis 10/11/2008  . Paroxysmal ventricular tachycardia ([place]) 08/28/2008  . Atrial fibrillation ([place]) 08/28/2008  . History of cardiovascular disorder 12/06/2007  . Hypertensive heart disease 05/09/2007  . LIPOMA 05/06/2007  . Hyperlipidemia 05/06/2007  . Gout 05/06/2007  . Chronic combined systolic and diastolic CHF (congestive heart failure) ([place]) 05/06/2007  . ACUTE BUT ILL-DEFINED CEREBROVASCULAR DISEASE 05/06/2007  . DENTAL CARIES 05/06/2007   PCP:  Biagio Borg, MD Pharmacy:   [id number] [place], [place] [address] [place] [address] Phone: [phone] Fax: [phone]  [place], [facility] [address] [place] [address] [place] [address] Phone: [phone] Fax: [phone]     Social Determinants of Health ([place]) Interventions    Readmission Risk Interventions No flowsheet data found.

## 2019-01-03 NOTE — Progress Notes (Signed)
Advanced Heart Failure Rounding Note   Subjective:    Breathing better this am. Denied CP or SOB, orthopnea or PND. Was hungry. Creatinine coming down slowly.   INR 3.8. Remains in AFL vs atrial tach  Underwent DC-CV to NSR this afternoon.   Objective:   Weight Range:  Vital Signs:   Temp:  [97.8 F (36.6 C)-98.4 F (36.9 C)] 97.8 F (36.6 C) (09/01 1159) Pulse Rate:  [42-87] 68 (09/01 1415) Resp:  [17-26] 22 (09/01 1415) BP: (85-119)/(68-89) 111/86 (09/01 1430) SpO2:  [95 %-100 %] 100 % (09/01 1415) Weight:  [113 kg-114.6 kg] 113 kg (09/01 1256) Last BM Date: 01/02/19  Weight change: Filed Weights   01/02/19 2116 01/03/19 0538 01/03/19 1256  Weight: 114.6 kg 113 kg 113 kg    Intake/Output:   Intake/Output Summary (Last 24 hours) at 01/03/2019 1520 Last data filed at 01/03/2019 1356 Gross per 24 hour  Intake 330 ml  Output 1600 ml  Net -1270 ml     Physical Exam: General:  Lying in bed. No resp difficulty HEENT: normal Neck: supple. JVP 8-9 . Carotids 2+ bilat; no bruits. No lymphadenopathy or thryomegaly appreciated. Cor: PMI nondisplaced. Iregular rate & rhythm. No rubs, gallops or murmurs. Lungs: clear Abdomen: obese soft, nontender, nondistended. No hepatosplenomegaly. No bruits or masses. Good bowel sounds. Extremities: no cyanosis, clubbing, rash, edema Neuro: alert & orientedx3, cranial nerves grossly intact. moves all 4 extremities w/o difficulty. Affect pleasant  Telemetry: AFL vs atach 80s Personally reviewed   Labs: Basic Metabolic Panel: Recent Labs  Lab 01/02/19 1335 01/03/19 0510  NA 137 137  K 4.4 4.4  CL 100 102  CO2 24 25  GLUCOSE 156* 113*  BUN 65* 66*  CREATININE 3.65* 3.44*  CALCIUM 9.5 9.1  MG  --  2.5*    Liver Function Tests: No results for input(s): AST, ALT, ALKPHOS, BILITOT, PROT, ALBUMIN in the last 168 hours. No results for input(s): LIPASE, AMYLASE in the last 168 hours. No results for input(s): AMMONIA in the  last 168 hours.  CBC: Recent Labs  Lab 01/02/19 1335  WBC 6.6  HGB 12.5*  HCT 39.5  MCV 100.3*  PLT 190    Cardiac Enzymes: No results for input(s): CKTOTAL, CKMB, CKMBINDEX, TROPONINI in the last 168 hours.  BNP: BNP (last 3 results) Recent Labs    04/28/18 1057 07/19/18 1106 01/02/19 1552  BNP 1,354.2* 1,608.5* 2,154.7*    ProBNP (last 3 results) Recent Labs    11/01/18 1229  PROBNP 2,158.0*      Other results:  Imaging: Dg Chest 2 View  Result Date: 01/02/2019 CLINICAL DATA:  Chest and epigastric pain and shortness of breath for 1 week. EXAM: CHEST - 2 VIEW COMPARISON:  Single-view of the chest 10/07/2017. FINDINGS: There is cardiomegaly and vascular congestion. AICD is in place. No consolidative process, pneumothorax or effusion. Atherosclerosis noted. IMPRESSION: No acute disease. Cardiomegaly and pulmonary vascular congestion. Atherosclerosis. Electronically Signed   By: Inge Rise M.D.   On: 01/02/2019 14:19      Medications:     Scheduled Medications: . amiodarone  200 mg Oral BID  . atorvastatin  80 mg Oral q1800  . carvedilol  6.25 mg Oral BID WC  . digoxin  0.125 mg Oral QODAY  . insulin aspart  0-9 Units Subcutaneous TID WC  . sodium chloride flush  3 mL Intravenous Once  . sodium chloride flush  3 mL Intravenous Q12H  . Warfarin - Pharmacist  Dosing Inpatient   Does not apply q1800     Infusions: . sodium chloride Stopped (01/03/19 1433)     PRN Medications:  sodium chloride, acetaminophen, nitroGLYCERIN, ondansetron (ZOFRAN) IV, sodium chloride flush   Assessment/Plan:   1. PAF - admitted with atrial flutter/atrial tach - underwent DC-CV today back into NSR - INR 3.6 Discussed dosing with PharmD personally.   2.  Chronic systolic heart failure with biventricular dysfunction secondary to nonischemic cardiomyopathy: -Presented with worsening dyspnea on exertion over the last several weeks -Last echocardiogram 11/17/2018  with LV EF of 20 to 57% with diastolic dysfunction and left ventricular diffuse hypokinesis -Right and left heart cath earlier this month with nonobstructive CAD and severe nonischemic cardiomyopathy with severe biventricular dysfunction, moderate PAH with normal left-sided pressures and moderate to severely reduced cardiac output -Volume status looks ok on exam and by ICD interrogation  - With AKI will hold diuretics at least one more day  3.  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 3.4 today. May be due to ATN in setting of AFL.  -Baseline appears to be in the 1.9-2.4 range  -Hold diuretics.  - Renal following  4.  CAD: -Most recent cardiac cath 12/06/2018 with mild nonobstructive CAD -Continue statin, beta-blocker -No ASA, on Coumadin for PAF  5.  History of VT: -[place] ICD in place -Last interrogation with no VT/VF or ICD shocks  6.  Pulmonary hypertension with OSA/CPAP: -On home supplemental O2 2 L -Repeat RHC 12/06/2018 with no significant change from prior -Follows with Dr. Halford Chessman, repeating sleep study -Continue CPAP   Length of Stay: 1   Glori Bickers MD 01/03/2019, 3:20 PM  Advanced Heart Failure Team Pager [phone] (M-F; 7a - 4p)  Please contact [facility] for night-coverage after hours (4p -7a ) and weekends on [email or web address]

## 2019-01-03 NOTE — Interval H&P Note (Signed)
History and Physical Interval Note:  01/03/2019 1:50 PM  [name]  has presented today for surgery, with the diagnosis of afib.  The various methods of treatment have been discussed with the patient and family. After consideration of risks, benefits and other options for treatment, the patient has consented to  Procedure(s): CARDIOVERSION (N/A) as a surgical intervention.  The patient's history has been reviewed, patient examined, no change in status, stable for surgery.  I have reviewed the patient's chart and labs.  Questions were answered to the patient's satisfaction.     Daniel Bensimhon

## 2019-01-03 NOTE — Progress Notes (Signed)
PROGRESS NOTE  [name] ZHY:[id number] DOB: Sep 22, 1955 DOA: 01/02/2019 PCP: Biagio Borg, MD  Brief History   [name] is a 63 y.o. male with medical history significant of HTN, systolic CHF last EF 20 to 25%, s/p ICD,  VT, CAD, CVA, OSA on CPAP, and CKD stage III; who presents with progressively worsening shortness of breath and intermittent chest pain.  Shortness of breath has progressively worsened to the point where he now complains of being short of breath even at rest.  He reports having intermittent left-sided chest pains that he rates as a 3 out of 10 on the pain scale.  Chest pains sometimes go away on their own, but sometimes reports having to take a nitroglycerin tablet to relieve pain.  He just recently underwent cardiac catheterization on 12/06/2018 which revealed no significant coronary artery disease, but did have mid to distal left circumflex stenosis of 50%.  At that time his EF was noted to be around 10 to 15% with severe biventricular dysfunction and moderate pulmonary hypertension.  He notes some mild lower extremity swelling, but denies any significant change in his weight.  Denies having any nausea, vomiting, diaphoresis, loss of consciousness, or recent falls.  Patient had just recently followed up with Dr. Lovena Le of cardiology 4 days ago to follow-up on history of VT and chronic systolic heart failure.  His torsemide dose had been decreased by his nephrologist due to worsening kidney function.  He was recommended to continue on amiodarone 200 mg twice daily until October 1.  Patient's only other complaint includes left upper quadrant abdominal pain   ED Course: Admission into the emergency department patient was noted to be afebrile, respiration 18-26, blood pressures 85/72 141/22, and O2 saturations maintained on 2 L of nasal cannula oxygen.  Labs significant for BNP 2154.7, high-sensitivity troponin  94, and INR 2.5.  X-ray revealed cardiomegaly and pulmonary vascular  congestion without overt edema.  COVID 19 screening was negative.  [facility] were consulted to admit the patient for further evaluation and treatment. Cardiology was consulted. They have evaluated the patient and plan to take him for DCCV of his Atrial flutter/Atrial tachycardia that they believe is behind his complaints of shortness of breath and chest pain due to demand ischemia.  Consultants  . Cardiology/EP . Nephrology  Procedures  . DCCV (pending)  Antibiotics   Anti-infectives (From admission, onward)   None    .  Subjective  The patient is resting comfortably. No new complaints.  Objective   Vitals:  Vitals:   01/03/19 1415 01/03/19 1430  BP: 94/70 111/86  Pulse: 68   Resp: (!) 22   Temp:    SpO2: 100%     Exam:  Constitutional:  . The patient is awake, alert, and oriented x 3. No acute distress. Respiratory:  . No increased work of breathing. . No wheezes, rales, or rhonchi. . No tactile fremitus. Cardiovascular:  . Regular rate and rhythm,  . No murmurs, ectopy, or gallups . No lateral PMI. No thrills.  Abdomen:  . Abdomen is soft, non-tender, non-distended. . No hernias, masses, or organomegaly . Normoactive bowel sounds. Musculoskeletal:  . No cyanosis, clubbing, or edema Skin:  . No rashes, lesions, ulcers . palpation of skin: no induration or nodules Neurologic:  . CN 2-12 intact . Sensation all 4 extremities intact Psychiatric:  . Mental status o Mood, affect appropriate o Orientation to person, place, time  . judgment and insight appear intact  I have personally reviewed the following:   Today's Data  . BMP, INR, Vitals  Cardiology Data  . EKG: Atrial tachycardia with 2;1 conduction  Scheduled Meds: . amiodarone  200 mg Oral BID  . atorvastatin  80 mg Oral q1800  . carvedilol  6.25 mg Oral BID WC  . digoxin  0.125 mg Oral QODAY  . insulin aspart  0-9 Units Subcutaneous TID WC  . sodium chloride flush  3 mL  Intravenous Once  . sodium chloride flush  3 mL Intravenous Q12H  . Warfarin - Pharmacist Dosing Inpatient   Does not apply q1800   Continuous Infusions: . sodium chloride Stopped (01/03/19 1433)  . dextrose 5 % and 0.45% NaCl      Principal Problem:   Acute kidney injury superimposed on chronic kidney disease (HCC) Active Problems:   Hyperlipidemia   Long term (current) use of anticoagulants   PAF (paroxysmal atrial fibrillation) (HCC)   Type 2 diabetes mellitus (HCC)   OSA (obstructive sleep apnea)   Essential hypertension   Systolic congestive heart failure (HCC)   LOS: 1 day   A & P  Systolic heart failure exacerbation: Acute on chronic.  Patient presents with progressively worsening shortness of breath.  On physical exam 1+ edema noted.  BNP elevated at 2154.7 which is higher than previous.  EF noted to be around 20 to 25% by echocardiogram on 11/17/2018 and 10-15% by echo following cardiac cath. The patient was admitted to a telemetry bed. Cardiology was consulted. The patient's volume status is being carefully monitored with I's and O's. Heart failure order set initiated. He will be diuresed with lasix 80 mg bid IV.   Acute kidney injury superimposed on chronic kidney disease stage III.  Creatinine acutely elevated up to 3.65 with BUN 65.  Patient's baseline creatinine previously noted to be around 2.4-2.6.  Followed by Dr.Niebuhr and nephrology in outpatient setting. Urinalysis is negative. Nephrology is following. I appreciated their help. Creatinine is 3.44 today. Avoiding nephrotoxic medications and hypotension. Monitor creatinine, electrolytes and volume status.  Acute on Chronic respiratory failure with hypoxia: Resolved. Patient normally on 2 L of nasal cannula oxygen at home. She is currently saturating at 100% on 2 L O2 by nasal cannula. Albuterol inhaler as needed.  Chest pain, elevated troponin: Acute.  High-sensitivity troponin is mildly elevated at 94 and 93.  Suspect  this is secondary to demand especially with recent negative heart cath. Cardiology feels that it is due to demand from atrial flutter/atrial tachycardia. DCCV planned.  Paroxysmal atrial fibrillation/atrial flutter on chronic anticoagulation: INR supratherapeutic today at 3.9.  Continue amiodarone and digoxin. Cardiology has planned on DCCV later today.  History of VT s/p ICD: Patient status post ICD.  No recent events noted after interrogation. Continue to monitor.   Insulin-dependent diabetes mellitus: Last hemoglobin A1c noted to be 6.5 on 07/21/2018.  On admission glucose 156.  Patient appears to be well controlled. The patient is on Lantus at home. It is currently being held as the patient is NPO for DCCV today. His glucoses are being followed by FSBS and SSI. He is on a hypoglycemic protocol.  CAD: Patient with recent left heart cath on 12/06/2018 showing nonobstructive coronary artery disease.  Essential hypertension: Continue Coreg  Hyperlipidemia: Continue atorvastatin  Macrocytic anemia: Acute.  Hemoglobin 12.5 with elevated MCV of 100.3.  Suspect dilutional effect from patient being fluid overloaded. Monitor hemoglobin.  OSA on CPAP: CPAP at night per RT  I have seen  and examined this patient myself. I have spent 34 minutes in his evaluation and care.  DVT prophylaxis:lovenox Code Status: full  Family Communication: Will update family over the phone Disposition Plan:  TBD   Magdiel Bartles, DO [facility] Direct contact: see [email or web address]  7PM-7AM contact night coverage as above 01/03/2019, 3:03 PM  LOS: 1 day

## 2019-01-03 NOTE — Progress Notes (Signed)
[facility] NEPHROLOGY PROGRESS NOTE  Assessment/ Plan: Pt is a 63 y.o. yo male with PMH of HTN, CAD, VT, BiV failure with EF 20 to 25%, ICD placement, pulmonary hypertension, CKD with recent creatinine level around 2.4-2.9, follows with Dr [name] at [facility], DM admitted with CHF exacerbation and we are consulted for AKI on CKD.  #AKI on CKD likely hemodynamically mediated reduced renal perfusion in the setting of atrial flutter and CHF exacerbation. -The creatinine level was 3.65 on admission.  Now trending down to 3.4 with IV diuretics.  Patient is nonoliguric, urine output around 1025 cc.  No uremia.  Agree with holding Aldactone.  Noted plan for cardioversion today, suspect optimizing heart rhythm will help renal perfusion.  Recommend to resume diuretics after the procedure.  No indication for dialysis.  #New onset atrial flutter/atrial tachycardia: Plan for DCCV today noted.  Seen by cardiology.  On digoxin, amiodarone.  #Acute on chronic CHF with EF of 20 to 25%.  Need to be back on dialysis after cardioversion.  Currently on Coreg.  #History of V. tach: Has ICD.  #Anemia due to CKD: Hemoglobin acceptable.  #Chronic respiratory failure with hypoxia: Uses 2 L of oxygen at home.  Stable   Subjective: Seen and examined at bedside.  Denies chest pain, shortness of breath, nausea vomiting.  No headache or dizziness. Objective Vital signs in last 24 hours: Vitals:   01/03/19 0538 01/03/19 0720 01/03/19 0822 01/03/19 0824  BP: 106/76 97/72  100/78  Pulse: (!) 44 78 85 85  Resp: 18 17    Temp: 98.1 F (36.7 C) 98.4 F (36.9 C)    TempSrc: Oral Oral    SpO2: 95% 95%  96%  Weight: 113 kg     Height:       Weight change:   Intake/Output Summary (Last 24 hours) at 01/03/2019 0845 Last data filed at 01/03/2019 0825 Gross per 24 hour  Intake 280 ml  Output 1325 ml  Net -1045 ml       Labs: Basic Metabolic Panel: Recent Labs  Lab 01/02/19 1335 01/03/19 0510  NA  137 137  K 4.4 4.4  CL 100 102  CO2 24 25  GLUCOSE 156* 113*  BUN 65* 66*  CREATININE 3.65* 3.44*  CALCIUM 9.5 9.1   Liver Function Tests: No results for input(s): AST, ALT, ALKPHOS, BILITOT, PROT, ALBUMIN in the last 168 hours. No results for input(s): LIPASE, AMYLASE in the last 168 hours. No results for input(s): AMMONIA in the last 168 hours. CBC: Recent Labs  Lab 01/02/19 1335  WBC 6.6  HGB 12.5*  HCT 39.5  MCV 100.3*  PLT 190   Cardiac Enzymes: No results for input(s): CKTOTAL, CKMB, CKMBINDEX, TROPONINI in the last 168 hours. CBG: Recent Labs  Lab 01/02/19 2200 01/03/19 0558  GLUCAP 161* 116*    Iron Studies: No results for input(s): IRON, TIBC, TRANSFERRIN, FERRITIN in the last 72 hours. Studies/Results: Dg Chest 2 View  Result Date: 01/02/2019 CLINICAL DATA:  Chest and epigastric pain and shortness of breath for 1 week. EXAM: CHEST - 2 VIEW COMPARISON:  Single-view of the chest 10/07/2017. FINDINGS: There is cardiomegaly and vascular congestion. AICD is in place. No consolidative process, pneumothorax or effusion. Atherosclerosis noted. IMPRESSION: No acute disease. Cardiomegaly and pulmonary vascular congestion. Atherosclerosis. Electronically Signed   By: Inge Rise M.D.   On: 01/02/2019 14:19    Medications: Infusions: . sodium chloride      Scheduled Medications: . amiodarone  200 mg Oral BID  . atorvastatin  80 mg Oral q1800  . carvedilol  6.25 mg Oral BID WC  . digoxin  0.125 mg Oral QODAY  . heparin  5,000 Units Subcutaneous Q8H  . insulin aspart  0-9 Units Subcutaneous TID WC  . sodium chloride flush  3 mL Intravenous Once  . sodium chloride flush  3 mL Intravenous Q12H  . Warfarin - Pharmacist Dosing Inpatient   Does not apply q1800    have reviewed scheduled and prn medications.  Physical Exam: General:NAD, comfortable Neck: + JVD Heart:RRR, s1s2 nl, no rubs Lungs:clear b/l, no crackle Abdomen:soft, Non-tender,  non-distended Extremities:+ edema Neurology: Alert awake and following commands  Dron Tanna Furry 01/03/2019,8:45 AM  LOS: 1 day  Pager: [phone]

## 2019-01-04 LAB — CBC WITH DIFFERENTIAL/PLATELET
Abs Immature Granulocytes: 0.03 10*3/uL (ref 0.00–0.07)
Basophils Absolute: 0 10*3/uL (ref 0.0–0.1)
Basophils Relative: 0 %
Eosinophils Absolute: 0 10*3/uL (ref 0.0–0.5)
Eosinophils Relative: 1 %
HCT: 36 % — ABNORMAL LOW (ref 39.0–52.0)
Hemoglobin: 11.2 g/dL — ABNORMAL LOW (ref 13.0–17.0)
Immature Granulocytes: 1 %
Lymphocytes Relative: 8 %
Lymphs Abs: 0.5 10*3/uL — ABNORMAL LOW (ref 0.7–4.0)
MCH: 31.2 pg (ref 26.0–34.0)
MCHC: 31.1 g/dL (ref 30.0–36.0)
MCV: 100.3 fL — ABNORMAL HIGH (ref 80.0–100.0)
Monocytes Absolute: 0.6 10*3/uL (ref 0.1–1.0)
Monocytes Relative: 11 %
Neutro Abs: 4.8 10*3/uL (ref 1.7–7.7)
Neutrophils Relative %: 79 %
Platelets: 168 10*3/uL (ref 150–400)
RBC: 3.59 MIL/uL — ABNORMAL LOW (ref 4.22–5.81)
RDW: 17.1 % — ABNORMAL HIGH (ref 11.5–15.5)
WBC: 5.9 10*3/uL (ref 4.0–10.5)
nRBC: 0 % (ref 0.0–0.2)

## 2019-01-04 LAB — BASIC METABOLIC PANEL
Anion gap: 13 (ref 5–15)
BUN: 67 mg/dL — ABNORMAL HIGH (ref 8–23)
CO2: 24 mmol/L (ref 22–32)
Calcium: 8.9 mg/dL (ref 8.9–10.3)
Chloride: 100 mmol/L (ref 98–111)
Creatinine, Ser: 3.29 mg/dL — ABNORMAL HIGH (ref 0.61–1.24)
GFR calc Af Amer: 22 mL/min — ABNORMAL LOW (ref >60)
GFR calc non Af Amer: 19 mL/min — ABNORMAL LOW (ref >60)
Glucose, Bld: 104 mg/dL — ABNORMAL HIGH (ref 70–99)
Potassium: 4.7 mmol/L (ref 3.5–5.1)
Sodium: 137 mmol/L (ref 135–145)

## 2019-01-04 LAB — RENAL FUNCTION PANEL
Albumin: 3.2 g/dL — ABNORMAL LOW (ref 3.5–5.0)
Anion gap: 10 (ref 5–15)
BUN: 66 mg/dL — ABNORMAL HIGH (ref 8–23)
CO2: 24 mmol/L (ref 22–32)
Calcium: 9 mg/dL (ref 8.9–10.3)
Chloride: 103 mmol/L (ref 98–111)
Creatinine, Ser: 3.3 mg/dL — ABNORMAL HIGH (ref 0.61–1.24)
GFR calc Af Amer: 22 mL/min — ABNORMAL LOW (ref >60)
GFR calc non Af Amer: 19 mL/min — ABNORMAL LOW (ref >60)
Glucose, Bld: 105 mg/dL — ABNORMAL HIGH (ref 70–99)
Phosphorus: 4.9 mg/dL — ABNORMAL HIGH (ref 2.5–4.6)
Potassium: 4.7 mmol/L (ref 3.5–5.1)
Sodium: 137 mmol/L (ref 135–145)

## 2019-01-04 LAB — GLUCOSE, CAPILLARY
Glucose-Capillary: 110 mg/dL — ABNORMAL HIGH (ref 70–99)
Glucose-Capillary: 139 mg/dL — ABNORMAL HIGH (ref 70–99)
Glucose-Capillary: 96 mg/dL (ref 70–99)
Glucose-Capillary: 131 mg/dL — ABNORMAL HIGH (ref 70–99)

## 2019-01-04 LAB — UREA NITROGEN, URINE: Urea Nitrogen, Ur: 457 mg/dL

## 2019-01-04 LAB — PROTIME-INR
INR: 3.2 — ABNORMAL HIGH (ref 0.8–1.2)
Prothrombin Time: 32.2 seconds — ABNORMAL HIGH (ref 11.4–15.2)

## 2019-01-04 MED ORDER — TORSEMIDE 20 MG PO TABS
40.0000 mg | ORAL_TABLET | Freq: Every day | ORAL | Status: DC
  Administered 2019-01-04 – 2019-01-05 (×2): 40 mg via ORAL
  Filled 2019-01-04 (×2): qty 2

## 2019-01-04 MED ORDER — WARFARIN SODIUM 5 MG PO TABS
5.0000 mg | ORAL_TABLET | Freq: Once | ORAL | Status: AC
  Administered 2019-01-04: 5 mg via ORAL
  Filled 2019-01-04: qty 1

## 2019-01-04 NOTE — Progress Notes (Signed)
RT offered pt CPAP dream station pt declined stating he does not wear at home either. RT expressed to pt to call if he changes his mind. RT will continue to monitor.

## 2019-01-04 NOTE — Progress Notes (Signed)
Pt stated that he has not eaten anything and that he is very hungry. I have reviewed notes and did not see any reason for pt to be NPO. Paged Swayze MD and Heart Failure team to make aware. No new orders at this time.

## 2019-01-04 NOTE — Progress Notes (Signed)
Advanced Heart Failure Rounding Note   Subjective:    Underwent DC-CV on 9/1. Remains in NSR  Feels weak. Mild DOE but no SOB at rest. No orthopnea or PND.  Creatinine stable at 3.30. Renal restarted torsemide.   Objective:   Weight Range:  Vital Signs:   Temp:  [97.7 F (36.5 C)-98.1 F (36.7 C)] 97.7 F (36.5 C) (09/02 2009) Pulse Rate:  [61-70] 68 (09/02 2009) Resp:  [16-20] 18 (09/02 2009) BP: (96-111)/(73-81) 108/75 (09/02 2009) SpO2:  [95 %-98 %] 95 % (09/02 2009) Last BM Date: 01/03/19  Weight change: Filed Weights   01/02/19 2116 01/03/19 0538 01/03/19 1256  Weight: 114.6 kg 113 kg 113 kg    Intake/Output:   Intake/Output Summary (Last 24 hours) at 01/04/2019 2300 Last data filed at 01/04/2019 2150 Gross per 24 hour  Intake 720 ml  Output 1600 ml  Net -880 ml     Physical Exam: General:  Lying in bed. No resp difficulty HEENT: normal Neck: supple. JVP hard to see. Looks mildly elevated Carotids 2+ bilat; no bruits. No lymphadenopathy or thryomegaly appreciated. Cor: PMI nondisplaced. Regular rate & rhythm. No rubs, gallops or murmurs. Lungs: clear Abdomen: obese soft, nontender, nondistended. No hepatosplenomegaly. No bruits or masses. Good bowel sounds. Extremities: no cyanosis, clubbing, rash, trace edema  warm Neuro: alert & orientedx3, cranial nerves grossly intact. moves all 4 extremities w/o difficulty. Affect pleasant   Telemetry: NSR 60s Personally reviewed   Labs: Basic Metabolic Panel: Recent Labs  Lab 01/02/19 1335 01/03/19 0510 01/04/19 0429  NA 137 137 137  137  K 4.4 4.4 4.7  4.7  CL 100 102 103  100  CO2 24 25 24  24   GLUCOSE 156* 113* 105*  104*  BUN 65* 66* 66*  67*  CREATININE 3.65* 3.44* 3.30*  3.29*  CALCIUM 9.5 9.1 9.0  8.9  MG  --  2.5*  --   PHOS  --   --  4.9*    Liver Function Tests: Recent Labs  Lab 01/04/19 0429  ALBUMIN 3.2*   No results for input(s): LIPASE, AMYLASE in the last 168 hours.  No results for input(s): AMMONIA in the last 168 hours.  CBC: Recent Labs  Lab 01/02/19 1335 01/04/19 0429  WBC 6.6 5.9  NEUTROABS  --  4.8  HGB 12.5* 11.2*  HCT 39.5 36.0*  MCV 100.3* 100.3*  PLT 190 168    Cardiac Enzymes: No results for input(s): CKTOTAL, CKMB, CKMBINDEX, TROPONINI in the last 168 hours.  BNP: BNP (last 3 results) Recent Labs    04/28/18 1057 07/19/18 1106 01/02/19 1552  BNP 1,354.2* 1,608.5* 2,154.7*    ProBNP (last 3 results) Recent Labs    11/01/18 1229  PROBNP 2,158.0*      Other results:  Imaging: No results found.   Medications:     Scheduled Medications: . amiodarone  200 mg Oral BID  . atorvastatin  80 mg Oral q1800  . carvedilol  6.25 mg Oral BID WC  . digoxin  0.125 mg Oral QODAY  . insulin aspart  0-9 Units Subcutaneous TID WC  . sodium chloride flush  3 mL Intravenous Once  . sodium chloride flush  3 mL Intravenous Q12H  . torsemide  40 mg Oral Daily  . Warfarin - Pharmacist Dosing Inpatient   Does not apply q1800    Infusions: . sodium chloride Stopped (01/03/19 1433)    PRN Medications: sodium chloride, acetaminophen, nitroGLYCERIN, ondansetron (ZOFRAN) IV,  sodium chloride flush   Assessment/Plan:   1. PAF - admitted with atrial flutter/atrial tach - underwent DC-CV 9/1. Maintaining NSR today - INR 3.6 -> 3.2 Warfarin dosing discussed with PharmD personally.  2.  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 (agree) - continue carvedilol and digoxin  - no ACE/ARB/ARNI with renal disease - can consider restarting Bidil but need to keep SBP > 110 for renal perfusion  3.  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 3.4 -> 3.3 today. May be due to ATN in setting of AFL.  -Baseline previously in the 1.9-2.4 range but I think he may reaching new higher baseline -Restart diuretics -Appreciate Renal input -I worry he is progressing toward ESRD  4.  CAD: -Most recent cardiac cath 12/06/2018 with mild nonobstructive CAD -Continue statin, beta-blocker -No ASA, on Coumadin for PAF  5.  History of VT: -[place] ICD in place -Last interrogation with no VT/VF or ICD shocks  6.  Pulmonary hypertension with OSA/CPAP: -On home supplemental O2 2 L -Repeat RHC 12/06/2018 with no significant change from prior -Follows with Dr. Halford Chessman, repeating sleep study -Continue CPAP   Length of Stay: 2   Glori Bickers MD 01/04/2019, 11:00 PM  Advanced Heart Failure Team Pager [phone] (M-F; [place])  Please contact [facility] for night-coverage after hours (4p -7a ) and weekends on [email or web address]

## 2019-01-04 NOTE — Progress Notes (Signed)
Pt is very upset, states that he wants to eat. Attending MD aware. This Nurse have paged the Heart failure team [phone].  Paged Bensimhon MD directly and received orders for Heart Healty/Carb modified diet orders.  Will place diet orders now.

## 2019-01-04 NOTE — Progress Notes (Signed)
PROGRESS NOTE  HARVEST STANCO ZOX:[id number] DOB: 1955-12-19 DOA: 01/02/2019 PCP: Biagio Borg, MD  Brief History   [name] is a 63 y.o. male with medical history significant of HTN, systolic CHF last EF 20 to 25%, s/p ICD,  VT, CAD, CVA, OSA on CPAP, and CKD stage III; who presents with progressively worsening shortness of breath and intermittent chest pain.  Shortness of breath has progressively worsened to the point where he now complains of being short of breath even at rest.  He reports having intermittent left-sided chest pains that he rates as a 3 out of 10 on the pain scale.  Chest pains sometimes go away on their own, but sometimes reports having to take a nitroglycerin tablet to relieve pain.  He just recently underwent cardiac catheterization on 12/06/2018 which revealed no significant coronary artery disease, but did have mid to distal left circumflex stenosis of 50%.  At that time his EF was noted to be around 10 to 15% with severe biventricular dysfunction and moderate pulmonary hypertension.  He notes some mild lower extremity swelling, but denies any significant change in his weight.  Denies having any nausea, vomiting, diaphoresis, loss of consciousness, or recent falls.  Patient had just recently followed up with Dr. Lovena Le of cardiology 4 days ago to follow-up on history of VT and chronic systolic heart failure.  His torsemide dose had been decreased by his nephrologist due to worsening kidney function.  He was recommended to continue on amiodarone 200 mg twice daily until October 1.  Patient's only other complaint includes left upper quadrant abdominal pain   ED Course: Admission into the emergency department patient was noted to be afebrile, respiration 18-26, blood pressures 85/72 141/22, and O2 saturations maintained on 2 L of nasal cannula oxygen.  Labs significant for BNP 2154.7, high-sensitivity troponin  94, and INR 2.5.  X-ray revealed cardiomegaly and pulmonary vascular  congestion without overt edema.  COVID 19 screening was negative.  [facility] were consulted to admit the patient for further evaluation and treatment. Cardiology was consulted. They have evaluated the patient and plan to take him for DCCV of his Atrial flutter/Atrial tachycardia that they believe is behind his complaints of shortness of breath and chest pain due to demand ischemia.  Consultants   Cardiology/EP  Nephrology  Procedures   DCCV (pending)  Antibiotics   Anti-infectives (From admission, onward)   None     Subjective  The patient is resting comfortably. No new complaints.  Objective   Vitals:  Vitals:   01/04/19 0337 01/04/19 0846  BP: 107/81 111/77  Pulse: 64 61  Resp: 16 20  Temp: 98 F (36.7 C) 98.1 F (36.7 C)  SpO2: 98% 96%    Exam:  Constitutional:   The patient is awake, alert, and oriented x 3. No acute distress. Respiratory:   No increased work of breathing.  No wheezes, rales, or rhonchi.  No tactile fremitus. Cardiovascular:   Regular rate and rhythm,   No murmurs, ectopy, or gallups  No lateral PMI. No thrills.  Abdomen:   Abdomen is soft, non-tender, non-distended.  No hernias, masses, or organomegaly  Normoactive bowel sounds. Musculoskeletal:   No cyanosis, clubbing, or edema Skin:   No rashes, lesions, ulcers  palpation of skin: no induration or nodules Neurologic:   CN 2-12 intact  Sensation all 4 extremities intact Psychiatric:   Mental status o Mood, affect appropriate o Orientation to person, place, time   judgment and insight  appear intact    I have personally reviewed the following:   Today's Data   BMP, INR, Vitals  Cardiology Data   EKG: Atrial tachycardia with 2;1 conduction  Scheduled Meds:  amiodarone  200 mg Oral BID   atorvastatin  80 mg Oral q1800   carvedilol  6.25 mg Oral BID WC   digoxin  0.125 mg Oral QODAY   insulin aspart  0-9 Units Subcutaneous TID WC    sodium chloride flush  3 mL Intravenous Once   sodium chloride flush  3 mL Intravenous Q12H   torsemide  40 mg Oral Daily   warfarin  5 mg Oral ONCE-1800   Warfarin - Pharmacist Dosing Inpatient   Does not apply q1800   Continuous Infusions:  sodium chloride Stopped (01/03/19 1433)    Principal Problem:   Acute kidney injury superimposed on chronic kidney disease ([place]) Active Problems:   Hyperlipidemia   Long term (current) use of anticoagulants   PAF (paroxysmal atrial fibrillation) (HCC)   Type 2 diabetes mellitus (HCC)   OSA (obstructive sleep apnea)   Essential hypertension   Systolic congestive heart failure (HCC)   LOS: 2 days   A & P  Systolic heart failure exacerbation: Acute on chronic.  Patient presents with progressively worsening shortness of breath.  On physical exam 1+ edema noted.  BNP elevated at 2154.7 which is higher than previous.  EF noted to be around 20 to 25% by echocardiogram on 11/17/2018 and 10-15% by echo following cardiac cath. The patient was admitted to a telemetry bed. Cardiology was consulted. The patient's volume status is being carefully monitored with I's and O's. Heart failure order set initiated. He will be diuresed with lasix 80 mg bid IV.   Acute kidney injury superimposed on chronic kidney disease stage III.  Creatinine acutely elevated up to 3.65 with BUN 65.  Patient's baseline creatinine previously noted to be around 2.4-2.6.  Followed by Dr.Wessels and nephrology in outpatient setting. Urinalysis is negative. Nephrology is following. I appreciated their help. Creatinine is 3.44 today. Avoiding nephrotoxic medications and hypotension. Monitor creatinine, electrolytes and volume status.  Acute on Chronic respiratory failure with hypoxia: Resolved. Patient normally on 2 L of nasal cannula oxygen at home. She is currently saturating at 100% on 2 L O2 by nasal cannula. Albuterol inhaler as needed.  Chest pain, elevated troponin: Acute.   High-sensitivity troponin is mildly elevated at 94 and 93.  Suspect this is secondary to demand especially with recent negative heart cath. Cardiology feels that it is due to demand from atrial flutter/atrial tachycardia. DCCV performed yesterday afternoon.  Paroxysmal atrial fibrillation/atrial flutter on chronic anticoagulation: INR supratherapeutic today at 3.9.  Continue amiodarone and digoxin. Cardiology has planned on DCCV later today.  History of VT s/p ICD: Patient status post ICD.  No recent events noted after interrogation. Continue to monitor.   Insulin-dependent diabetes mellitus: Last hemoglobin A1c noted to be 6.5 on 07/21/2018.  On admission glucose 156.  Patient appears to be well controlled. The patient is on Lantus at home. It is currently being held as the patient is NPO for DCCV today. His glucoses are being followed by FSBS and SSI. He is on a hypoglycemic protocol.  CAD: Patient with recent left heart cath on 12/06/2018 showing nonobstructive coronary artery disease.  Essential hypertension: Continue Coreg  Hyperlipidemia: Continue atorvastatin  Macrocytic anemia: Acute.  Hemoglobin 12.5 with elevated MCV of 100.3.  Suspect dilutional effect from patient being fluid overloaded. Monitor hemoglobin.  OSA on CPAP: CPAP at night per RT  I have seen and examined this patient myself. I have spent 32 minutes in his evaluation and care.  DVT prophylaxis:lovenox Code Status: full  Family Communication: Will update family over the phone Disposition Plan:  TBD   Darius Santalucia, DO [facility] Direct contact: see [email or web address]  7PM-7AM contact night coverage as above 01/03/2019, 3:03 PM  LOS: 1 day

## 2019-01-04 NOTE — Progress Notes (Signed)
Patient does not wear CPAP for OSA. RT will continue to monitor.

## 2019-01-04 NOTE — Progress Notes (Signed)
[facility] NEPHROLOGY PROGRESS NOTE  Assessment/ Plan: Pt is a 63 y.o. yo male with PMH of HTN, CAD, VT, BiV failure with EF 20 to 25%, ICD placement, pulmonary hypertension, CKD with recent creatinine level around 2.4-2.9, follows with Dr [name] at [facility], DM admitted with CHF exacerbation and we are consulted for AKI on CKD.  #AKI on CKD likely hemodynamically mediated/reduced renal perfusion in the setting of atrial flutter and CHF exacerbation. -The creatinine level was 3.65 on admission.  Now creatinine level is stable around 3.3.  Patient is nonoliguric. He has lower extremity edema therefore resume torsemide daily. Agree with holding Aldactone. No uremia. No indication for dialysis.  #New onset atrial flutter/atrial tachycardia: Status post DCCV on 9/1.  Cardiology is following.  On digoxin, amiodarone.  #Acute on chronic CHF with EF of 20 to 25%.  Resumed diuretics.  Renal function stable, back on diuretics.  Nothing further to add.  Patient will need to follow with Dr. [name] after discharge.  Discussed with the patient.  I will sign off, please call back with question.  Subjective: Seen and examined at bedside.  Denies nausea, vomiting, chest pain, shortness of breath.  He is hungry.    Objective Vital signs in last 24 hours: Vitals:   01/03/19 1925 01/03/19 2306 01/04/19 0337 01/04/19 0846  BP: 90/71 96/73 107/81 111/77  Pulse: 70 70 64 61  Resp:  20 16 20   Temp: (!) 97.3 F (36.3 C) 97.9 F (36.6 C) 98 F (36.7 C) 98.1 F (36.7 C)  TempSrc: Oral Oral  Oral  SpO2: 91% 95% 98% 96%  Weight:      Height:       Weight change: -1.543 kg  Intake/Output Summary (Last 24 hours) at 01/04/2019 0947 Last data filed at 01/04/2019 0244 Gross per 24 hour  Intake 1025.5 ml  Output 675 ml  Net 350.5 ml       Labs: Basic Metabolic Panel: Recent Labs  Lab 01/02/19 1335 01/03/19 0510 01/04/19 0429  NA 137 137 137  137  K 4.4 4.4 4.7  4.7  CL 100 102 103   100  CO2 24 25 24  24   GLUCOSE 156* 113* 105*  104*  BUN 65* 66* 66*  67*  CREATININE 3.65* 3.44* 3.30*  3.29*  CALCIUM 9.5 9.1 9.0  8.9  PHOS  --   --  4.9*   Liver Function Tests: Recent Labs  Lab 01/04/19 0429  ALBUMIN 3.2*   No results for input(s): LIPASE, AMYLASE in the last 168 hours. No results for input(s): AMMONIA in the last 168 hours. CBC: Recent Labs  Lab 01/02/19 1335 01/04/19 0429  WBC 6.6 5.9  NEUTROABS  --  4.8  HGB 12.5* 11.2*  HCT 39.5 36.0*  MCV 100.3* 100.3*  PLT 190 168   Cardiac Enzymes: No results for input(s): CKTOTAL, CKMB, CKMBINDEX, TROPONINI in the last 168 hours. CBG: Recent Labs  Lab 01/03/19 1156 01/03/19 1240 01/03/19 1641 01/03/19 2122 01/04/19 0554  GLUCAP 101* 82 132* 172* 110*    Iron Studies: No results for input(s): IRON, TIBC, TRANSFERRIN, FERRITIN in the last 72 hours. Studies/Results: Dg Chest 2 View  Result Date: 01/02/2019 CLINICAL DATA:  Chest and epigastric pain and shortness of breath for 1 week. EXAM: CHEST - 2 VIEW COMPARISON:  Single-view of the chest 10/07/2017. FINDINGS: There is cardiomegaly and vascular congestion. AICD is in place. No consolidative process, pneumothorax or effusion. Atherosclerosis noted. IMPRESSION: No acute disease. Cardiomegaly and  pulmonary vascular congestion. Atherosclerosis. Electronically Signed   By: Inge Rise M.D.   On: 01/02/2019 14:19    Medications: Infusions: . sodium chloride Stopped (01/03/19 1433)    Scheduled Medications: . amiodarone  200 mg Oral BID  . atorvastatin  80 mg Oral q1800  . carvedilol  6.25 mg Oral BID WC  . digoxin  0.125 mg Oral QODAY  . insulin aspart  0-9 Units Subcutaneous TID WC  . sodium chloride flush  3 mL Intravenous Once  . sodium chloride flush  3 mL Intravenous Q12H  . torsemide  40 mg Oral Daily  . warfarin  5 mg Oral ONCE-1800  . Warfarin - Pharmacist Dosing Inpatient   Does not apply q1800    have reviewed scheduled and  prn medications.  Physical Exam: General:NAD, comfortable Heart:RRR, s1s2 nl, no rubs Lungs: Clear b/l, no crackle, no wheezing Abdomen:soft, Non-tender, non-distended Extremities:+ edema stable Neurology: Alert awake and following commands   Tanna Furry 01/04/2019,9:47 AM  LOS: 2 days  Pager: [phone]

## 2019-01-04 NOTE — Progress Notes (Signed)
ANTICOAGULATION CONSULT NOTE Pharmacy Consult for warfarin Indication: atrial fibrillation  No Known Allergies  Patient Measurements: Height: 5\' 10"  (177.8 cm) Weight: 249 lb 3.2 oz (113 kg) IBW/kg (Calculated) : 73  Vital Signs: Temp: 98 F (36.7 C) (09/02 0337) Temp Source: Oral (09/01 2306) BP: 107/81 (09/02 0337) Pulse Rate: 64 (09/02 0337)  Labs: Recent Labs    01/02/19 1335 01/02/19 1552 01/03/19 0510 01/04/19 0429  HGB 12.5*  --   --  11.2*  HCT 39.5  --   --  36.0*  PLT 190  --   --  168  LABPROT  --  26.6* 37.2* 32.2*  INR  --  2.5* 3.8* 3.2*  CREATININE 3.65*  --  3.44* 3.30*  3.29*  TROPONINIHS 94* 93*  --   --     Estimated Creatinine Clearance: 29.2 mL/min (A) (by C-G formula based on SCr of 3.3 mg/dL (H)).    Assessment: 57 yoM admitted for volume overload. On warfarin PTA for PAF, PTA dose is 7.5 mg on MWF, 5 mg on all other days. INR therapeutic on admission, now slightly above goal at 3.2 (warfarin held yesterday). Pt now s/p DCCV to NSR yesterday.  Goal of Therapy:  INR 2-3 Monitor platelets by anticoagulation protocol: Yes   Plan:  -Warfarin 5mg  PO x1 tonight -Daily INR   Arrie Senate, PharmD, BCPS Clinical Pharmacist [phone] Please check AMION for all [facility] Pharmacy numbers 01/04/2019

## 2019-01-05 LAB — GLUCOSE, CAPILLARY
Glucose-Capillary: 90 mg/dL (ref 70–99)
Glucose-Capillary: 119 mg/dL — ABNORMAL HIGH (ref 70–99)

## 2019-01-05 LAB — BASIC METABOLIC PANEL
Anion gap: 11 (ref 5–15)
BUN: 64 mg/dL — ABNORMAL HIGH (ref 8–23)
CO2: 26 mmol/L (ref 22–32)
Calcium: 9 mg/dL (ref 8.9–10.3)
Chloride: 99 mmol/L (ref 98–111)
Creatinine, Ser: 3.21 mg/dL — ABNORMAL HIGH (ref 0.61–1.24)
GFR calc Af Amer: 23 mL/min — ABNORMAL LOW (ref >60)
GFR calc non Af Amer: 20 mL/min — ABNORMAL LOW (ref >60)
Glucose, Bld: 93 mg/dL (ref 70–99)
Potassium: 4.3 mmol/L (ref 3.5–5.1)
Sodium: 136 mmol/L (ref 135–145)

## 2019-01-05 LAB — PROTIME-INR
INR: 2.9 — ABNORMAL HIGH (ref 0.8–1.2)
Prothrombin Time: 29.8 seconds — ABNORMAL HIGH (ref 11.4–15.2)

## 2019-01-05 LAB — MAGNESIUM: Magnesium: 2.4 mg/dL (ref 1.7–2.4)

## 2019-01-05 LAB — CBC
HCT: 36.2 % — ABNORMAL LOW (ref 39.0–52.0)
Hemoglobin: 11.6 g/dL — ABNORMAL LOW (ref 13.0–17.0)
MCH: 32 pg (ref 26.0–34.0)
MCHC: 32 g/dL (ref 30.0–36.0)
MCV: 100 fL (ref 80.0–100.0)
Platelets: 161 10*3/uL (ref 150–400)
RBC: 3.62 MIL/uL — ABNORMAL LOW (ref 4.22–5.81)
RDW: 17.1 % — ABNORMAL HIGH (ref 11.5–15.5)
WBC: 5.4 10*3/uL (ref 4.0–10.5)
nRBC: 0 % (ref 0.0–0.2)

## 2019-01-05 LAB — DIGOXIN LEVEL: Digoxin Level: 1 ng/mL (ref 0.8–2.0)

## 2019-01-05 MED ORDER — CARVEDILOL 3.125 MG PO TABS: 3.1250 mg | ORAL_TABLET | Freq: Two times a day (BID) | ORAL | 0 refills | Status: DC

## 2019-01-05 MED ORDER — AMIODARONE HCL 200 MG PO TABS: 200.0000 mg | ORAL_TABLET | Freq: Two times a day (BID) | ORAL | 0 refills | Status: DC

## 2019-01-05 MED ORDER — CARVEDILOL 3.125 MG PO TABS: 3.1250 mg | ORAL_TABLET | Freq: Two times a day (BID) | ORAL | Status: DC

## 2019-01-05 MED ORDER — TORSEMIDE 20 MG PO TABS: 40.0000 mg | ORAL_TABLET | Freq: Every day | ORAL | 0 refills | Status: DC

## 2019-01-05 MED ORDER — WARFARIN SODIUM 5 MG PO TABS: 5.0000 mg | ORAL_TABLET | Freq: Once | ORAL | Status: DC

## 2019-01-05 NOTE — Progress Notes (Signed)
[place] for warfarin Indication: atrial fibrillation  No Known Allergies  Patient Measurements: Height: 5\' 10"  (177.8 cm) Weight: 251 lb 12.8 oz (114.2 kg) IBW/kg (Calculated) : 73  Vital Signs: Temp: 98.6 F (37 C) (09/03 0541) Temp Source: Oral (09/03 0541) BP: 104/76 (09/03 0541) Pulse Rate: 66 (09/03 0541)  Labs: Recent Labs    01/02/19 1335  01/02/19 1552 01/03/19 0510 01/04/19 0429 01/05/19 0513  HGB 12.5*  --   --   --  11.2* 11.6*  HCT 39.5  --   --   --  36.0* 36.2*  PLT 190  --   --   --  168 161  LABPROT  --    < > 26.6* 37.2* 32.2* 29.8*  INR  --    < > 2.5* 3.8* 3.2* 2.9*  CREATININE 3.65*  --   --  3.44* 3.30*  3.29* 3.21*  TROPONINIHS 94*  --  93*  --   --   --    < > = values in this interval not displayed.    Estimated Creatinine Clearance: 30.2 mL/min (A) (by C-G formula based on SCr of 3.21 mg/dL (H)).    Assessment: 57 yoM admitted for volume overload. On warfarin PTA for PAF, PTA dose is 7.5 mg on MWF, 5 mg on all other days.  INR therapeutic this morning at 2.9. Pt has been more sensitive this admission. H/H stable, no reported bleeding.  Goal of Therapy:  INR 2-3 Monitor platelets by anticoagulation protocol: Yes   Plan:  -Warfarin 5mg  PO x1 again tonight -Daily INR   Arrie Senate, PharmD, BCPS Clinical Pharmacist [phone] Please check AMION for all [place] numbers 01/05/2019

## 2019-01-05 NOTE — Care Management Important Message (Signed)
Important Message  Patient Details  Name: [name] MRN: [id number] Date of Birth: 01-Oct-1955   Medicare Important Message Given:  Yes     Shelda Altes 01/05/2019, 11:29 AM

## 2019-01-05 NOTE — Progress Notes (Signed)
Discharge and medication education given with teach back.  Peripheral iv removed, clean dry and intact, dressing applied. Pt refused medications from [facility] stated that he will pick up medications from [facility]. Patient's belonging with pt: clothes, shoes, cell phone. Pt has his oxygen tank at bedside to go home with.  Pt is currently getting dressed, pt's spouse will call when arrives to [place] entrance.

## 2019-01-05 NOTE — CV Procedure (Signed)
    DIRECT CURRENT CARDIOVERSION  NAME:  [name]   MRN: [id number] DOB:  [birth year]   ADMIT DATE: 01/02/2019   INDICATIONS: Atrial flutter.tach   PROCEDURE:   Informed consent was obtained prior to the procedure. The risks, benefits and alternatives for the procedure were discussed and the patient comprehended these risks. Once an appropriate time out was taken, the patient had the defibrillator pads placed in the anterior and posterior position. The patient then underwent sedation by the anesthesia service. Once an appropriate level of sedation was achieved, the patient received a single biphasic, synchronized 200J shock with prompt conversion to sinus rhythm. No apparent complications.  Glori Bickers, MD  5:14 PM

## 2019-01-05 NOTE — Progress Notes (Addendum)
Advanced Heart Failure Rounding Note  Patient Name: [name] Date of Encounter: 01/05/2019  Primary Cardiologist: Dr. Haroldine Laws   Subjective   Feeling ok. No complaints. No resting dyspnea. Not ambulating much. Wants to go home.   Creatinine slightly improved.   Inpatient Medications    Scheduled Meds: . amiodarone  200 mg Oral BID  . atorvastatin  80 mg Oral q1800  . carvedilol  6.25 mg Oral BID WC  . digoxin  0.125 mg Oral QODAY  . insulin aspart  0-9 Units Subcutaneous TID WC  . sodium chloride flush  3 mL Intravenous Once  . sodium chloride flush  3 mL Intravenous Q12H  . torsemide  40 mg Oral Daily  . warfarin  5 mg Oral ONCE-1800  . Warfarin - Pharmacist Dosing Inpatient   Does not apply q1800   Continuous Infusions: . sodium chloride Stopped (01/03/19 1433)   PRN Meds: sodium chloride, acetaminophen, nitroGLYCERIN, ondansetron (ZOFRAN) IV, sodium chloride flush   Vital Signs    Vitals:   01/04/19 2009 01/05/19 0536 01/05/19 0541 01/05/19 0600  BP: 108/75  104/76   Pulse: 68  66   Resp: 18  (!) 5 18  Temp: 97.7 F (36.5 C)  98.6 F (37 C)   TempSrc: Oral  Oral   SpO2: 95%  97%   Weight:  114.2 kg    Height:        Intake/Output Summary (Last 24 hours) at 01/05/2019 0752 Last data filed at 01/05/2019 9767 Gross per 24 hour  Intake 960 ml  Output 1675 ml  Net -715 ml   Last 3 Weights 01/05/2019 01/03/2019 01/03/2019  Weight (lbs) 251 lb 12.8 oz 249 lb 3.2 oz 249 lb 3.2 oz  Weight (kg) 114.216 kg 113.036 kg 113.036 kg      Telemetry    NSR 70s, with occasional PVCs - Personally Reviewed  ECG    No new EKGs to review today - Personally Reviewed  Physical Exam   GEN: middle aged AAM in no acute distress. Wearing supplemental O2 via [place].    Neck: mildly elevated JVD Cardiac: RRR, no murmurs, rubs, or gallops.  Respiratory: Clear to auscultation bilaterally. GI: Soft, nontender, non-distended  MS: No edema; No deformity. Neuro:  Nonfocal   Psych: Normal affect   Labs    High Sensitivity Troponin:   Recent Labs  Lab 01/02/19 1335 01/02/19 1552  TROPONINIHS 94* 93*      Chemistry Recent Labs  Lab 01/03/19 0510 01/04/19 0429 01/05/19 0513  NA 137 137  137 136  K 4.4 4.7  4.7 4.3  CL 102 103  100 99  CO2 25 24  24 26   GLUCOSE 113* 105*  104* 93  BUN 66* 66*  67* 64*  CREATININE 3.44* 3.30*  3.29* 3.21*  CALCIUM 9.1 9.0  8.9 9.0  ALBUMIN  --  3.2*  --   GFRNONAA 18* 19*  19* 20*  GFRAA 21* 22*  22* 23*  ANIONGAP 10 10  13 11      Hematology Recent Labs  Lab 01/02/19 1335 01/04/19 0429 01/05/19 0513  WBC 6.6 5.9 5.4  RBC 3.94* 3.59* 3.62*  HGB 12.5* 11.2* 11.6*  HCT 39.5 36.0* 36.2*  MCV 100.3* 100.3* 100.0  MCH 31.7 31.2 32.0  MCHC 31.6 31.1 32.0  RDW 17.1* 17.1* 17.1*  PLT 190 168 161    BNP Recent Labs  Lab 01/02/19 1552  BNP 2,154.7*     DDimer No results  for input(s): DDIMER in the last 168 hours.   Radiology    No results found.  Cardiac Studies   Echocardiogram 11/17/2018:  1. The left ventricle has severely reduced systolic function, with an ejection fraction of 20-25%. The cavity size was mild to moderately dilated. There is moderately increased left ventricular wall thickness. Left ventricular diastolic Doppler  parameters are consistent with impaired relaxation. There is right ventricular volume and pressure overload. Left ventricular diffuse hypokinesis. 2. The right ventricle has severely reduced systolic function. The cavity was moderately enlarged. There is no increase in right ventricular wall thickness. 3. Right atrial size was severely dilated. 4. The pericardial effusion is posterior to the left ventricle. 5. Trivial pericardial effusion is present. 6. There is mild dilatation of the ascending aorta. 7. The inferior vena cava was dilated in size with >50% respiratory variability.  R/LHC 12/06/18 RIGHT/LEFT HEART CATH AND CORONARY ANGIOGRAPHY   Conclusion    Mid Cx to Dist Cx lesion is 50% stenosed.  Prox RCA lesion is 20% stenosed.  Mid LAD lesion is 30% stenosed.   Findings:  Ao = 115/80 (95) LV =  100/12 RA = 10 RV = 74/14 PA = 73/36 (48) PCW = 9 Fick cardiac output/index = 5.7/2.4 Thermo CO/CI = 4.0/1.7 PVR = 6.8 (Fick) PVR = 9.7 (Thermo) FA sat = 96% PA sat = 65%, 67%  Assessment:  1. Minimal non-obstructive CAD 2. Severe NICM EF 10-15% by echo with severe biventricular dysfunction 3. Moderate PAH with normal left-sided pressures 4. Moderate to severely reduced CO      Patient Profile     [name] is a 63 y.o. male with a hx of longstandingcardiomyopathy out of proportion to CAD/chronic systolic CHF(EF 37-10% 10/2692), prior VT,s/p [employer] ICD, CAD,CVA,OSA on CPAP, pulmonary HTN on O2 at home, PAFon coumadin,HTN,and CKD stage 3, admitted for acute on chronic systolic CHF and new onset AFL.   Assessment & Plan    1. PAF - admitted with atrial flutter/atrial tach - underwent DC-CV 9/1. Maintaining NSR today. HR in the 70s - Continue amiodarone 200 mg daily. Will need TFTs and HFTs Q6 months and annual eye exams - Continue Coumadin for a/c. INR therapeutic at 2.9. PharmD assisting w/ coumadin dosing  2.Chronic systolic heart failure with biventricular dysfunction secondary to nonischemic cardiomyopathy: -Presented with worsening dyspnea on exertion over the last several weeks -Last echocardiogram 7/16/2020with LV EF of 20 to 85% with diastolic dysfunction and left ventricular diffuse hypokinesis -Right and left heart cath earlier this month with nonobstructive CAD and severe nonischemic cardiomyopathy with severe biventricular dysfunction, moderate PAH with normal left-sided pressures and moderate to severely reduced cardiac output -Volume status looks to be creeping back up based on weight, up from 249>>251 lb today - torsemide restarted by Renal, 40 mg daily.  -1.6L out in [facility]  yesterday. SCr/BUN improving -no resting dyspnea but needs to ambulate today to help better assess respiratory status/ see if DOE - continue carvedilol and digoxin  - no ACE/ARB/ARNI with renal disease - can consider restarting Bidil but need to keep SBP > 110 for renal perfusion  3.  Acute onCKD stage III-IV: -Creatinine 3.6 on admit. Trending down, 3.4>>3.3>>3.2 today. May be due to ATN in setting of AFL.  -Baseline previously in the 1.9-2.4 rangebut he may reaching new higher baseline -Diuretics have been restarted, torsemide 40 mg daily -Nephrology has seen and signed off 9/3. Recs>>continue daily torsemide, continue to hold Aldactone. No indication for HD  currenlty -He will need to f/u with Dr. [name] post discharge  4. CAD: -Most recent cardiac cath 12/06/2018 with mild nonobstructive CAD -Continue statin, beta-blocker -No ASA, on Coumadin for PAF  5. History of VT: -[place] ICD in place -Last interrogation with no VT/VF or ICD shocks  6. Pulmonary hypertension with OSA/CPAP: -On home supplemental O2 2 L -Repeat RHC8/08/2018 with no significant change from prior -Follows with Dr. Halford Chessman, repeating sleep study -Continue CPAP  For questions or updates, please contact [facility] Please consult [email or web address] for contact info under        Signed, Lyda Jester, PA-C  01/05/2019, 7:52 AM    Patient seen and examined with the above-signed Advanced Practice Provider and/or Housestaff. I personally reviewed laboratory data, imaging studies and relevant notes. I independently examined the patient and formulated the important aspects of the plan. I have edited the note to reflect any of my changes or salient points. I have personally discussed the plan with the patient and/or family.  He is maintaining NSR. Was on amio at home will restart. Back on torsemide.Weight up slightly. Creatinine improving. INR 2.9   On exam. Comfortable JVP slightly elevated Cor RRR  Lungs clear Ab obese NT Ext warm trace edema  Ok for home today on  Amio 200 bid Warfarin  Torsemide 40 daily Atorva 80 Carvedilol 3.125 bid (reduced dose) Hold bidil with low BP (can restart in Clinic) Kdur 20 daily   Stop digoxin and spiro   Will arrange f/u in HF Clinic  Glori Bickers, MD  9:33 AM

## 2019-01-05 NOTE — Discharge Instructions (Signed)

## 2019-01-06 ENCOUNTER — Telehealth: Payer: Self-pay | Admitting: *Deleted

## 2019-01-06 NOTE — Telephone Encounter (Signed)
Pt was on TCM report admitted 01/02/19 for worsening shortness of breath and intermittent chest pain. X-ray revealed cardiomegaly and pulmonary vascular congestion without overt edema. Pt D/C 01/05/19, and will f/u w/cardiology Dr.Bensimhon 01/12/19 .Marland KitchenJohny Chess

## 2019-01-07 NOTE — Discharge Summary (Signed)
Physician Discharge Summary  GAYLORD SEYDEL GOT:[id number] DOB: 1956/03/30 DOA: 01/02/2019  PCP: Biagio Borg, MD  Admit date: 01/02/2019 Discharge date: 01/07/2019  Recommendations for Outpatient Follow-up:  1. The patient is discharged to home. 2. He will follow up with Dr. Haroldine Laws on 01/12/2019. 3. He is to follow up with his pcp in 7-10 days.  Follow-up Information    Bensimhon, Shaune Pascal, MD. Go on 01/12/2019.   Specialty: Cardiology Why: 9:20 AM, [facility], parking code [place] information: [address] [place] [address] [phone]            Discharge Diagnoses: Principal diagnosis is #1 1. Systolic CHF acute on chronic - EF 20-25% 2. AKI on CKD III 3. Demand ischemia 4. Paroxysmal Atrial Fibrillation 5. DM II - Insulin requiring 6. Unstable angina  Discharge Condition: Fair Disposition: Home  Diet recommendation: Heart Healthy/Diabetes  Filed Weights   01/03/19 0538 01/03/19 1256 01/05/19 0536  Weight: 113 kg 113 kg 114.2 kg    History of present illness:   [name] is a 63 y.o. male with medical history significant of HTN, systolic CHF last EF 20 to 25%, s/p ICD,  VT, CAD, CVA, OSA on CPAP, and CKD stage III; who presents with progressively worsening shortness of breath and intermittent chest pain.  Shortness of breath has progressively worsened to the point where he now complains of being short of breath even at rest.  He reports having intermittent left-sided chest pains that he rates as a 3 out of 10 on the pain scale.  Chest pains sometimes go away on their own, but sometimes reports having to take a nitroglycerin tablet to relieve pain.  He just recently underwent cardiac catheterization on 12/06/2018 which revealed no significant coronary artery disease, but did have mid to distal left circumflex stenosis of 50%.  At that time his EF was noted to be around 10 to 15% with severe biventricular dysfunction and  moderate pulmonary hypertension.  He notes some mild lower extremity swelling, but denies any significant change in his weight.  Denies having any nausea, vomiting, diaphoresis, loss of consciousness, or recent falls.  Patient had just recently followed up with Dr. Lovena Le of cardiology 4 days ago to follow-up on history of VT and chronic systolic heart failure.  His torsemide dose had been decreased by his nephrologist due to worsening kidney function.  He was recommended to continue on amiodarone 200 mg twice daily until October 1.  Patient's only other complaint includes left upper quadrant abdominal pain   ED Course: Admission into the emergency department patient was noted to be afebrile, respiration 18-26, blood pressures 85/72 141/22, and O2 saturations maintained on 2 L of nasal cannula oxygen.  Labs significant for BNP 2154.7, high-sensitivity troponin  94, and INR 2.5.  X-ray revealed cardiomegaly and pulmonary vascular congestion without overt edema.  COVID 19 screening was negative. Hospital Course:  [facility] were consulted to admit the patient for further evaluation and treatment. Cardiology was consulted. They have evaluated the patient and plan to take him for DCCV of his Atrial flutter/Atrial tachycardia that they believe is behind his complaints of shortness of breath and chest pain due to demand ischemia. The patient was converted on 01/03/2019. He tolerated the procedure well and was cleared for discharge on 01/05/2019.  Today's assessment: S: The patient is resting comfortably. No new complaints. O: Vitals:  Vitals:   01/05/19 0926 01/05/19 1210  BP: 107/76  97/79  Pulse: 68 67  Resp: 16 18  Temp: 97.9 F (36.6 C) 98.4 F (36.9 C)  SpO2:  97%    Constitutional:  . The patient is awake, alert, oriented x 3. No acute distress. Respiratory:  . CTA bilaterally, no w/r/r.  . Respiratory effort normal. No retractions or accessory muscle use Cardiovascular:  . RRR, no  m/r/g . No LE extremity edema   . Normal pedal pulses Abdomen:  . Abdomen appears normal; no tenderness or masses . No hernias . No HSM Musculoskeletal:  . No cyanosis, clubbing or edema Skin:  . No rashes, lesions, ulcers . palpation of skin: no induration or nodules Neurologic:  . CN 2-12 intact . Sensation all 4 extremities intact Psychiatric:  . judgement and insight appear normal . Mental status o Mood, affect appropriate o Orientation to person, place, time Discharge Instructions  Discharge Instructions    (HEART FAILURE PATIENTS) Call MD:  Anytime you have any of the following symptoms: 1) 3 pound weight gain in 24 hours or 5 pounds in 1 week 2) shortness of breath, with or without a dry hacking cough 3) swelling in the hands, feet or stomach 4) if you have to sleep on extra pillows at night in order to breathe.   Complete by: As directed    Call MD for:  difficulty breathing, headache or visual disturbances   Complete by: As directed    Call MD for:  extreme fatigue   Complete by: As directed    Call MD for:  persistant dizziness or light-headedness   Complete by: As directed    Diet - low sodium heart healthy   Complete by: As directed    Carbohydrate controlled.   Diet Carb Modified   Complete by: As directed    Discharge instructions   Complete by: As directed    Follow up with PCP in 7-10 days. Follow up with heart Failure Clinic as directed. Chemistry to be drawn on 01/12/2019 and reported to PCP.   Heart Failure patients record your daily weight using the same scale at the same time of day   Complete by: As directed    Increase activity slowly   Complete by: As directed      Allergies as of 01/05/2019   No Known Allergies     Medication List    STOP taking these medications   allopurinol 300 MG tablet Commonly known as: ZYLOPRIM   aspirin 81 MG EC tablet   cholecalciferol 1000 units tablet Commonly known as: VITAMIN D   digoxin 0.125 MG tablet  Commonly known as: LANOXIN   GLUCOMANNAN PO   isosorbide-hydrALAZINE 20-37.5 MG tablet Commonly known as: BIDIL   potassium chloride SA 20 MEQ tablet Commonly known as: K-DUR   spironolactone 25 MG tablet Commonly known as: ALDACTONE   traMADol 50 MG tablet Commonly known as: ULTRAM     TAKE these medications   acetaminophen 500 MG tablet Commonly known as: TYLENOL Take 500-1,000 mg by mouth every 6 (six) hours as needed (for bilateral knee pain).   amiodarone 200 MG tablet Commonly known as: PACERONE Take 1 tablet (200 mg total) by mouth 2 (two) times daily.   atorvastatin 80 MG tablet Commonly known as: LIPITOR TAKE 1 TABLET BY MOUTH EVERYDAY AT BEDTIME What changed: See the new instructions.   carvedilol 3.125 MG tablet Commonly known as: COREG Take 1 tablet (3.125 mg total) by mouth 2 (two) times daily with a meal. What changed:  medication strength  how much to take   Insulin Glargine 100 UNIT/ML Solostar Pen Commonly known as: Lantus SoloStar INJECT 10 UNITS INTO THE SKIN AT BEDTIME. What changed:   how much to take  how to take this  when to take this   Lantus SoloStar 100 UNIT/ML Solostar Pen Generic drug: Insulin Glargine INJECT 10 UNITS INTO THE SKIN AT BEDTIME. What changed: Another medication with the same name was changed. Make sure you understand how and when to take each.   nitroGLYCERIN 0.4 MG SL tablet Commonly known as: NITROSTAT PLACE 1 TABLET UNDER TONGUE EVERY 5 MINS X3 DOSES AS NEEDED FOR CHEST PAIN What changed: See the new instructions.   OXYGEN Inhale into the lungs. 2L continuous; 3L on pulse machine   Pen Needles 31G X 5 MM Misc 100 each by Does not apply route 2 (two) times daily.   Insulin Pen Needle 31G X 8 MM Misc Commonly known as: B-D ULTRAFINE III SHORT PEN USE ONCE DAILY WITH INSULIN   torsemide 20 MG tablet Commonly known as: DEMADEX Take 2 tablets (40 mg total) by mouth daily. What changed:   how much  to take  when to take this  additional instructions   Turmeric 500 MG Caps Take 1,000 mg by mouth daily at 12 noon.   warfarin 5 MG tablet Commonly known as: COUMADIN Take as directed. If you are unsure how to take this medication, talk to your nurse or doctor. Original instructions: Take 1 tablet daily except take 1 1/2 tablets on Mon Wed and Fri or Take as directed by Coumadin clinic. What changed:   how much to take  how to take this  when to take this  additional instructions      No Known Allergies  The results of significant diagnostics from this hospitalization (including imaging, microbiology, ancillary and laboratory) are listed below for reference.    Significant Diagnostic Studies: Dg Chest 2 View  Result Date: 01/02/2019 CLINICAL DATA:  Chest and epigastric pain and shortness of breath for 1 week. EXAM: CHEST - 2 VIEW COMPARISON:  Single-view of the chest 10/07/2017. FINDINGS: There is cardiomegaly and vascular congestion. AICD is in place. No consolidative process, pneumothorax or effusion. Atherosclerosis noted. IMPRESSION: No acute disease. Cardiomegaly and pulmonary vascular congestion. Atherosclerosis. Electronically Signed   By: Inge Rise M.D.   On: 01/02/2019 14:19    Microbiology: Recent Results (from the past 240 hour(s))  SARS Coronavirus 2 [facility] order, Performed in [facility] hospital lab) Nasopharyngeal Nasopharyngeal Swab     Status: None   Collection Time: 01/02/19  4:37 PM   Specimen: Nasopharyngeal Swab  Result Value Ref Range Status   SARS Coronavirus 2 NEGATIVE NEGATIVE Final    Comment: (NOTE) If result is NEGATIVE SARS-CoV-2 target nucleic acids are NOT DETECTED. The SARS-CoV-2 RNA is generally detectable in upper and lower  respiratory specimens during the acute phase of infection. The lowest  concentration of SARS-CoV-2 viral copies this assay can detect is 250  copies / mL. A negative result does not preclude SARS-CoV-2  infection  and should not be used as the sole basis for treatment or other  patient management decisions.  A negative result may occur with  improper specimen collection / handling, submission of specimen other  than nasopharyngeal swab, presence of viral mutation(s) within the  areas targeted by this assay, and inadequate number of viral copies  (<250 copies / mL). A negative result must be combined with clinical  observations, patient history, and epidemiological information. If result is POSITIVE SARS-CoV-2 target nucleic acids are DETECTED. The SARS-CoV-2 RNA is generally detectable in upper and lower  respiratory specimens dur ing the acute phase of infection.  Positive  results are indicative of active infection with SARS-CoV-2.  Clinical  correlation with patient history and other diagnostic information is  necessary to determine patient infection status.  Positive results do  not rule out bacterial infection or co-infection with other viruses. If result is PRESUMPTIVE POSTIVE SARS-CoV-2 nucleic acids MAY BE PRESENT.   A presumptive positive result was obtained on the submitted specimen  and confirmed on repeat testing.  While 2019 novel coronavirus  (SARS-CoV-2) nucleic acids may be present in the submitted sample  additional confirmatory testing may be necessary for epidemiological  and / or clinical management purposes  to differentiate between  SARS-CoV-2 and other Sarbecovirus currently known to infect humans.  If clinically indicated additional testing with an alternate test  methodology [id number]) is advised. The SARS-CoV-2 RNA is generally  detectable in upper and lower respiratory sp ecimens during the acute  phase of infection. The expected result is Negative. Fact Sheet for Patients:  StrictlyIdeas.no Fact Sheet for Healthcare Providers: BankingDealers.co.za This test is not yet approved or cleared by the [place]  FDA and has been authorized for detection and/or diagnosis of SARS-CoV-2 by FDA under an Emergency Use Authorization (EUA).  This EUA will remain in effect (meaning this test can be used) for the duration of the COVID-19 declaration under Section 564(b)(1) of the Act, 21 U.S.C. section 360bbb-3(b)(1), unless the authorization is terminated or revoked sooner. Performed at [facility], [place] [address], [place], [place] [address]      Labs: Basic Metabolic Panel: Recent Labs  Lab 01/02/19 1335 01/03/19 0510 01/04/19 0429 01/05/19 0513  NA 137 137 137  137 136  K 4.4 4.4 4.7  4.7 4.3  CL 100 102 103  100 99  CO2 24 25 24  24 26   GLUCOSE 156* 113* 105*  104* 93  BUN 65* 66* 66*  67* 64*  CREATININE 3.65* 3.44* 3.30*  3.29* 3.21*  CALCIUM 9.5 9.1 9.0  8.9 9.0  MG  --  2.5*  --  2.4  PHOS  --   --  4.9*  --    Liver Function Tests: Recent Labs  Lab 01/04/19 0429  ALBUMIN 3.2*   No results for input(s): LIPASE, AMYLASE in the last 168 hours. No results for input(s): AMMONIA in the last 168 hours. CBC: Recent Labs  Lab 01/02/19 1335 01/04/19 0429 01/05/19 0513  WBC 6.6 5.9 5.4  NEUTROABS  --  4.8  --   HGB 12.5* 11.2* 11.6*  HCT 39.5 36.0* 36.2*  MCV 100.3* 100.3* 100.0  PLT 190 168 161   Cardiac Enzymes: No results for input(s): CKTOTAL, CKMB, CKMBINDEX, TROPONINI in the last 168 hours. BNP: BNP (last 3 results) Recent Labs    04/28/18 1057 07/19/18 1106 01/02/19 1552  BNP 1,354.2* 1,608.5* 2,154.7*    ProBNP (last 3 results) Recent Labs    11/01/18 1229  PROBNP 2,158.0*    CBG: Recent Labs  Lab 01/04/19 1106 01/04/19 1700 01/04/19 2143 01/05/19 0626 01/05/19 1115  GLUCAP 139* 96 131* 90 119*    Principal Problem:   Acute kidney injury superimposed on chronic kidney disease (HCC) Active Problems:   Hyperlipidemia   Long term (current) use of anticoagulants   PAF (paroxysmal atrial fibrillation) (HCC)   Type  2 diabetes  mellitus (HCC)   OSA (obstructive sleep apnea)   Essential hypertension   Systolic congestive heart failure ([place])   Time coordinating discharge: 38 minutes.  Signed:        Alexarae Oliva, DO [facility]  01/07/2019, 1:38 PM

## 2019-01-12 ENCOUNTER — Other Ambulatory Visit: Payer: Self-pay

## 2019-01-12 ENCOUNTER — Ambulatory Visit (HOSPITAL_COMMUNITY)
Admission: RE | Admit: 2019-01-12 | Discharge: 2019-01-12 | Disposition: A | Discharge status: Home / Self Care | Payer: Medicare Other | Source: Ambulatory Visit | Attending: Internal Medicine | Admitting: Internal Medicine

## 2019-01-12 ENCOUNTER — Encounter (HOSPITAL_COMMUNITY): Payer: Self-pay | Admitting: Internal Medicine

## 2019-01-12 VITALS — BP 120/90 | HR 67 | Wt 256.0 lb

## 2019-01-12 DIAGNOSIS — I5022 Chronic systolic (congestive) heart failure: Secondary | ICD-10-CM | POA: Diagnosis not present

## 2019-01-12 DIAGNOSIS — G4733 Obstructive sleep apnea (adult) (pediatric): Secondary | ICD-10-CM | POA: Diagnosis not present

## 2019-01-12 DIAGNOSIS — I272 Pulmonary hypertension, unspecified: Secondary | ICD-10-CM | POA: Diagnosis not present

## 2019-01-12 DIAGNOSIS — Z9981 Dependence on supplemental oxygen: Secondary | ICD-10-CM | POA: Diagnosis not present

## 2019-01-12 DIAGNOSIS — N183 Chronic kidney disease, stage 3 unspecified: Secondary | ICD-10-CM

## 2019-01-12 DIAGNOSIS — I480 Paroxysmal atrial fibrillation: Secondary | ICD-10-CM | POA: Diagnosis not present

## 2019-01-12 DIAGNOSIS — E785 Hyperlipidemia, unspecified: Secondary | ICD-10-CM | POA: Insufficient documentation

## 2019-01-12 DIAGNOSIS — Z79899 Other long term (current) drug therapy: Secondary | ICD-10-CM | POA: Diagnosis not present

## 2019-01-12 DIAGNOSIS — I130 Hypertensive heart and chronic kidney disease with heart failure and stage 1 through stage 4 chronic kidney disease, or unspecified chronic kidney disease: Secondary | ICD-10-CM | POA: Insufficient documentation

## 2019-01-12 DIAGNOSIS — Z794 Long term (current) use of insulin: Secondary | ICD-10-CM | POA: Insufficient documentation

## 2019-01-12 DIAGNOSIS — Z87891 Personal history of nicotine dependence: Secondary | ICD-10-CM | POA: Insufficient documentation

## 2019-01-12 DIAGNOSIS — I2510 Atherosclerotic heart disease of native coronary artery without angina pectoris: Secondary | ICD-10-CM | POA: Diagnosis not present

## 2019-01-12 DIAGNOSIS — Z8673 Personal history of transient ischemic attack (TIA), and cerebral infarction without residual deficits: Secondary | ICD-10-CM | POA: Insufficient documentation

## 2019-01-12 DIAGNOSIS — Z8249 Family history of ischemic heart disease and other diseases of the circulatory system: Secondary | ICD-10-CM | POA: Insufficient documentation

## 2019-01-12 DIAGNOSIS — E1122 Type 2 diabetes mellitus with diabetic chronic kidney disease: Secondary | ICD-10-CM | POA: Diagnosis not present

## 2019-01-12 DIAGNOSIS — I5042 Chronic combined systolic (congestive) and diastolic (congestive) heart failure: Secondary | ICD-10-CM

## 2019-01-12 DIAGNOSIS — Z95810 Presence of automatic (implantable) cardiac defibrillator: Secondary | ICD-10-CM | POA: Insufficient documentation

## 2019-01-12 DIAGNOSIS — Z7901 Long term (current) use of anticoagulants: Secondary | ICD-10-CM | POA: Diagnosis not present

## 2019-01-12 DIAGNOSIS — I428 Other cardiomyopathies: Secondary | ICD-10-CM | POA: Insufficient documentation

## 2019-01-12 LAB — BASIC METABOLIC PANEL
Anion gap: 9 (ref 5–15)
BUN: 45 mg/dL — ABNORMAL HIGH (ref 8–23)
CO2: 27 mmol/L (ref 22–32)
Calcium: 8.9 mg/dL (ref 8.9–10.3)
Chloride: 104 mmol/L (ref 98–111)
Creatinine, Ser: 2.97 mg/dL — ABNORMAL HIGH (ref 0.61–1.24)
GFR calc Af Amer: 25 mL/min — ABNORMAL LOW (ref >60)
GFR calc non Af Amer: 22 mL/min — ABNORMAL LOW (ref >60)
Glucose, Bld: 98 mg/dL (ref 70–99)
Potassium: 3.8 mmol/L (ref 3.5–5.1)
Sodium: 140 mmol/L (ref 135–145)

## 2019-01-12 LAB — CBC
HCT: 39 % (ref 39.0–52.0)
Hemoglobin: 12.2 g/dL — ABNORMAL LOW (ref 13.0–17.0)
MCH: 31.8 pg (ref 26.0–34.0)
MCHC: 31.3 g/dL (ref 30.0–36.0)
MCV: 101.6 fL — ABNORMAL HIGH (ref 80.0–100.0)
Platelets: 168 10*3/uL (ref 150–400)
RBC: 3.84 MIL/uL — ABNORMAL LOW (ref 4.22–5.81)
RDW: 17 % — ABNORMAL HIGH (ref 11.5–15.5)
WBC: 5.4 10*3/uL (ref 4.0–10.5)
nRBC: 0 % (ref 0.0–0.2)

## 2019-01-12 MED ORDER — ISOSORB DINITRATE-HYDRALAZINE 20-37.5 MG PO TABS: 0.5000 | ORAL_TABLET | Freq: Three times a day (TID) | ORAL | 3 refills | Status: DC

## 2019-01-12 NOTE — Progress Notes (Signed)
Advanced Heart Failure Clinic Note PCP: Primary Cardiologist: Dr. [facility]   HPI: [name] is a 63 y.o. male with history of longstanding cardiomyopathy out of proportion to CAD/chronic systolic CHF (EF 51-76% 05/6071), prior VT, s/p St Jude ICD, CAD, CVA, OSA on CPAP, pulmonary HTN on O2 at home, PAF on coumadin, HTN, and CKD stage 3.  Admitted from Dr Hochrein's office 10/01/17 with volume overload. HF team was consulted. Underwent R/LHC 10/04/17 - full report below. PYP scan and myeloma panel completed and were not suggestive of cardiac amyloidosis. V/Q scan completed for elevated PA pressures on cath, which was negative. He diuresed 31 lbs with IV lasix and metolazone and transitioned to torsemide 60 mg daily. HF meds were optimized. No ACE/ARB with CKD and soft BPs. Discharge weight 269 pounds.  Echo 11/17/18: EF 20-25% RV dilated and severely HK with septal flattening. Personally reviewed  Cath 12/06/18 mild CAD  Ao = 115/80 (95) LV =  100/12 RA = 10 RV = 74/14 PA = 73/36 (48) PCW = 9 Fick cardiac output/index = 5.7/2.4 Thermo CO/CI = 4.0/1.7 PVR = 6.8 (Fick) PVR = 9.7 (Thermo) FA sat = 96% PA sat = 65%, 67%  Admitted 01/02/19 for atrial flutter and AKI. Had DC-CV on 01/03/19. Creatinine peaked at 3.4. At discharge was 3.2. Weight 251  Here for post hospital f/u. Weight stable at home. 251. Can do ADLs but gets fatigued with much more. No orthopnea or PND. Denies CP. Taking all meds as prescribed. No palpitations or syncope. No bleeding    ICD interrogation: No VT/VF. Thoracic impedence trending down but has not crossed threshold. No VT/VF shocks. Personally reviewed  Studies:  V/Q scan 10/07/17:No appreciable ventilation or perfusion defects.  PYP 10/05/17:Visual and quantitative assessment (grade 0/1, H/CLL equal 1.3) are equivocal for transthyretin amyloidosis.  LHC 10/04/17: Left Main  No significant disease.  Left Anterior Descending  Large,  wrap-around LAD with 30% mid vessel stenosis.  Left Circumflex  50-60% distal LCx stenosis.  Right Coronary Artery  50% mid RCA stenosis.   Melissa 10/04/17: RA mean 19 RV 72/23 PA 74/37, mean 51 PCWP mean 19 LV 111/28 AO 112/80 Oxygen saturations: PA 55% AO 95% Cardiac Output (Fick) 4.68  Cardiac Index (Fick) 1.9  PVR 6.8 WU CVP/PCWP 1 PAPi 1.95 1. Nonischemic cardiomyopathy. Coronary disease well out of proportion to degree of cardiomyopathy.  2. Elevated R>L heart filling pressures, suggestive of significant RV failure with high CVP/PCWP ratio and PAPi <2.  3. Low cardiac output.  4. Mixed pulmonary venous/pulmonary arterial hypertension, ?due to hypoxemia from OSA or OHS/OSA.   Review of systems complete and found to be negative unless listed in HPI.    SH:  Social History   Socioeconomic History  . Marital status: Married    Spouse name: Not on file  . Number of children: 2  . Years of education: Not on file  . Highest education level: Not on file  Occupational History  . Occupation: medically retired due to heart failure  Social Needs  . Financial resource strain: Not on file  . Food insecurity    Worry: Not on file    Inability: Not on file  . Transportation needs    Medical: Yes    Non-medical: No  Tobacco Use  . Smoking status: Former Smoker    Quit date: 05/04/1986    Years since quitting: 32.7  . Smokeless tobacco: Never Used  . Tobacco comment: Quit  smoking 30 yrs ago. Smoked as teenager less than 1/2 ppd. Smoked x 4 years.  Substance and Sexual Activity  . Alcohol use: No    Comment: occasionally  . Drug use: No  . Sexual activity: Never  Lifestyle  . Physical activity    Days per week: 0 days    Minutes per session: 0 min  . Stress: Not at all  Relationships  . Social Herbalist on phone: Not on file    Gets together: Not on file    Attends religious service: Not on file    Active member of club or organization: Not on file     Attends meetings of clubs or organizations: Not on file    Relationship status: Not on file  . Intimate partner violence    Fear of current or ex partner: Not on file    Emotionally abused: Not on file    Physically abused: Not on file    Forced sexual activity: Not on file  Other Topics Concern  . Not on file  Social History Narrative  . Not on file    FH:  Family History  Problem Relation Age of Onset  . Coronary artery disease Mother   . Heart attack Mother   . Heart attack Maternal Uncle   . Heart attack Maternal Grandmother     Past Medical History:  Diagnosis Date  . CAD (coronary artery disease)    a. Initial nonobst by cath 2009. b. Cath 01/2013 in setting of VT storm: obstructive distal Cx disease (small and terminates in the AV groove, unlikely to cause significant ischemia or electrical instability), nonobstructive RCA disease, EF 15-20%.   . Cerebrovascular accident [facility])    a. 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 Chronic systolic CHF (congestive heart failure) ([place])    a. Likely NICM (out of proportion to CAD). b. 2009 - EF 25-30% by echo, 01/2013: 15-20% by cath. c. 11/2014 Echo: EF 35-40%, Gr1 DD, mild MR, mod TR, PASP 9mmHg.  . CKD (chronic kidney disease), stage II   . Dyslipidemia   . Gout   . HTN (hypertension)   . Hypokalemia   . ICD (implantable cardiac defibrillator) in place   . Insulin dependent diabetes mellitus ([place])   . Lipoma   . Nonischemic cardiomyopathy ([place])    a.  11/2014 Echo: EF 35-40%, Gr1 DD, mild MR, mod TR, PASP 72mmHg.  . OSA (obstructive sleep apnea)    does not wear cpap  . PAF (paroxysmal atrial fibrillation) ([place])    a. Noted 05/2008 by EKG;  b. CHA2DS2VASc = 5-6-->coumadin.  . Paroxysmal VT ([place])    a. s/p St. Jude ICD 2007. b. H/o paroxysmal VT/VF including VT storm 12/2012 admission prompting amio initiation;  c. 01/2013 ICD upgrade SJM [id number] Ellipse VR single lead ICD.  [name] Pulmonary HTN ([place])    a. Mild by cath 01/2013.     Current Outpatient Medications  Medication Sig Dispense Refill  . acetaminophen (TYLENOL) 500 MG tablet Take 500-1,000 mg by mouth every 6 (six) hours as needed (for bilateral knee pain).    [name] amiodarone (PACERONE) 200 MG tablet Take 1 tablet (200 mg total) by mouth 2 (two) times daily. 60 tablet 0  . atorvastatin (LIPITOR) 80 MG tablet Take 80 mg by mouth daily at 6 PM.    . carvedilol (COREG) 3.125 MG tablet Take 1 tablet (3.125 mg total) by mouth 2 (two) times daily with a meal. 60  tablet 0  . Insulin Glargine (LANTUS SOLOSTAR) 100 UNIT/ML Solostar Pen Inject 10 units into the skin at bedtime    . Insulin Pen Needle (B-D ULTRAFINE III SHORT PEN) 31G X 8 MM MISC USE ONCE DAILY WITH INSULIN 100 each 3  . Insulin Pen Needle (PEN NEEDLES) 31G X 5 MM MISC 100 each by Does not apply route 2 (two) times daily. 100 each 5  . nitroGLYCERIN (NITROSTAT) 0.4 MG SL tablet Place 0.4 mg under the tongue every 5 (five) minutes as needed for chest pain.    . OXYGEN Inhale into the lungs. 2L continuous; 3L on pulse machine    . torsemide (DEMADEX) 20 MG tablet Take 2 tablets (40 mg total) by mouth daily. 30 tablet 0  . warfarin (COUMADIN) 5 MG tablet Take 7.5 mg by mouth on Monday, Wednesday and Friday and take 5 mg on Tuesday, Thursday, Saturday and Sunday in the evening     No current facility-administered medications for this encounter.    Vitals:   01/12/19 0916  BP: 120/90  Pulse: 67  SpO2: 97%  Weight: 120.2 kg (265 lb)   Wt Readings from Last 3 Encounters:  01/12/19 120.2 kg (265 lb)  01/05/19 114.2 kg (251 lb 12.8 oz)  12/29/18 116.7 kg (257 lb 3.2 oz)     Body mass index is 38.02 kg/m.  PHYSICAL EXAM: General:  Obese male. No resp difficulty HEENT: normal Neck: supple. JVP 6-7. Carotids 2+ bilat; no bruits. No lymphadenopathy or thryomegaly appreciated. Cor: PMI nondisplaced. Regular rate & rhythm. 2/6 TR Lungs: clear Abdomen: obese soft, nontender, nondistended. No  hepatosplenomegaly. No bruits or masses. Good bowel sounds. Extremities: no cyanosis, clubbing, rash, edema Neuro: alert & orientedx3, cranial nerves grossly intact. moves all 4 extremities w/o difficulty. Affect pleasant   ASSESSMENT & PLAN: 1. Chronic Systolic HF with biventricular dysfunction due to NICM - St Jude ICD  - ECHO 07/28/2017 EF 20-25%. RV mildly dilated.  - Echo 11/17/18 EF 20-25% RV dilated and severely HK with septal flattening. Personally reviewed - Chronic NYHA III symptoms. - Volume status stable ok on exam but rending up slightly on ICD. Will need to follow closely. Trying not to overdiurese to preserve kidney function.  - Continue torsemide 40mg  daily. Can take extra 20 in afternoon as needed - Carvedilol cut down 12.5 -> 3.125 mg twice a day with recent admit and low output - Off digoxin due to AKI  - Restart bidil 1/2 tab three times a day as BP tolerates.  - Off spiro and ACE/ARB due to CKD/AKI - He continues to deteriorate with biventricular dysfunction/PAH and progressive CKD. Only real option seems to be consideration of heart kidney transplant. I discussed with him and he would consider but age and weight may be prohibitive. (BMI 38). I have d/w Dr. Mosetta Pigeon at [facility] briefly and may warrant eval  2. CKD stage 3 - Baseline creatinine ~1.9. Recent Cr 2.8 -> 2.4-> 2.7 -> 3.4 -> 2.97 today - Follows with Renal   3. CAD - LHC 8/20 with LAD 20%  50% distal left circ and 20% mid RCA stenosis. No significant disease left main or LAD. - No s/s ischemia - Continue statin and bb.  - On ccoumadin Denies bleeding.  4. PAF  - s/p recent DC-CV for AFL. In NSR today. - Continue amio 200 bid. Drop to 200 daily in 2 weeks - On coumadin, followed by coumadin clinic on [place].  - Regular on exam. Denies bleeding.  6. Hx of VT - Has St Jude ICD. Interrogation as above. No VT/VF or ICD shocks  7. Pulmonary HTN - Continue home O2 (2L).  Doesn't always take with him out.  - RHC  as above   8. OSA with CPAP - off CPAP per Dr. Halford Chessman  Total time spent 45 minutes. Over half that time spent discussing above.    Glori Bickers, MD  9:32 AM

## 2019-01-12 NOTE — Patient Instructions (Signed)
Lab work done today. We will notify you of any abnormal lab work. No news is good news!  EKG done today.   START Bidil 0.5 tab three times daily.  Please follow up with the [facility] in 6 weeks.  At the [facility], you and your health needs are our priority. As part of our continuing mission to provide you with exceptional heart care, we have created designated Provider Care Teams. These Care Teams include your primary Cardiologist (physician) and Advanced Practice Providers (APPs- Physician Assistants and Nurse Practitioners) who all work together to provide you with the care you need, when you need it.   You may see any of the following providers on your designated Care Team at your next follow up: [name] Dr Glori Bickers . Dr [name] . Darrick Grinder, NP   Please be sure to bring in all your medications bottles to every appointment.

## 2019-01-13 ENCOUNTER — Other Ambulatory Visit: Payer: Self-pay

## 2019-01-13 ENCOUNTER — Ambulatory Visit (INDEPENDENT_AMBULATORY_CARE_PROVIDER_SITE_OTHER): Payer: Medicare Other | Admitting: General Practice

## 2019-01-13 DIAGNOSIS — Z7901 Long term (current) use of anticoagulants: Secondary | ICD-10-CM

## 2019-01-13 DIAGNOSIS — Z23 Encounter for immunization: Secondary | ICD-10-CM

## 2019-01-13 DIAGNOSIS — Z8679 Personal history of other diseases of the circulatory system: Secondary | ICD-10-CM

## 2019-01-13 DIAGNOSIS — I4891 Unspecified atrial fibrillation: Secondary | ICD-10-CM

## 2019-01-13 LAB — POCT INR: INR: 6.1 — AB (ref 2.0–3.0)

## 2019-01-13 NOTE — Patient Instructions (Addendum)
Pre visit review using our clinic review tool, if applicable. No additional management support is needed unless otherwise documented below in the visit note.  Hold coumadin through Monday.  On Tuesday continue to take 1 tablet daily except 1 1/2 tablets on Monday and Wed and Fridays.  Re-check in 1 week.  Pt is taking amiodarone.

## 2019-01-19 DIAGNOSIS — I129 Hypertensive chronic kidney disease with stage 1 through stage 4 chronic kidney disease, or unspecified chronic kidney disease: Secondary | ICD-10-CM | POA: Diagnosis not present

## 2019-01-19 DIAGNOSIS — D631 Anemia in chronic kidney disease: Secondary | ICD-10-CM | POA: Diagnosis not present

## 2019-01-19 DIAGNOSIS — N2581 Secondary hyperparathyroidism of renal origin: Secondary | ICD-10-CM | POA: Diagnosis not present

## 2019-01-19 DIAGNOSIS — E1122 Type 2 diabetes mellitus with diabetic chronic kidney disease: Secondary | ICD-10-CM | POA: Diagnosis not present

## 2019-01-19 DIAGNOSIS — N183 Chronic kidney disease, stage 3 (moderate): Secondary | ICD-10-CM | POA: Diagnosis not present

## 2019-01-20 ENCOUNTER — Other Ambulatory Visit: Payer: Self-pay

## 2019-01-20 ENCOUNTER — Ambulatory Visit (INDEPENDENT_AMBULATORY_CARE_PROVIDER_SITE_OTHER): Payer: Medicare Other | Admitting: General Practice

## 2019-01-20 DIAGNOSIS — Z8679 Personal history of other diseases of the circulatory system: Secondary | ICD-10-CM

## 2019-01-20 DIAGNOSIS — Z7901 Long term (current) use of anticoagulants: Secondary | ICD-10-CM | POA: Diagnosis not present

## 2019-01-20 DIAGNOSIS — I4891 Unspecified atrial fibrillation: Secondary | ICD-10-CM

## 2019-01-20 LAB — POCT INR: INR: 2.3 (ref 2.0–3.0)

## 2019-01-20 NOTE — Progress Notes (Signed)
Medical screening examination/treatment/procedure(s) were performed by non-physician practitioner and as supervising physician I was immediately available for consultation/collaboration. I agree with above. Rabia Argote, MD   

## 2019-01-20 NOTE — Patient Instructions (Addendum)
Pre visit review using our clinic review tool, if applicable. No additional management support is needed unless otherwise documented below in the visit note.  Take 1 tablet daily.  Re-check in 1 week.  Pt is taking amiodarone. Patient will decrease amiodarone at the end of this month.

## 2019-01-24 ENCOUNTER — Other Ambulatory Visit: Payer: Self-pay

## 2019-01-24 ENCOUNTER — Encounter: Payer: Self-pay | Admitting: Pulmonary Disease

## 2019-01-24 ENCOUNTER — Ambulatory Visit (INDEPENDENT_AMBULATORY_CARE_PROVIDER_SITE_OTHER): Payer: Medicare Other | Admitting: Pulmonary Disease

## 2019-01-24 ENCOUNTER — Other Ambulatory Visit (HOSPITAL_COMMUNITY): Payer: Self-pay

## 2019-01-24 VITALS — BP 100/62 | HR 82 | Temp 97.9°F | Ht 70.0 in | Wt 260.0 lb

## 2019-01-24 DIAGNOSIS — J9611 Chronic respiratory failure with hypoxia: Secondary | ICD-10-CM | POA: Diagnosis not present

## 2019-01-24 DIAGNOSIS — I5042 Chronic combined systolic (congestive) and diastolic (congestive) heart failure: Secondary | ICD-10-CM

## 2019-01-24 DIAGNOSIS — I272 Pulmonary hypertension, unspecified: Secondary | ICD-10-CM | POA: Diagnosis not present

## 2019-01-24 NOTE — Progress Notes (Signed)
Pulmonary, Critical Care, and Sleep Medicine  Chief Complaint  Patient presents with  . DOE (dyspnea on exertion)    Uses 2L pulse, but will turn up to 3L if having to exert himself.    Constitutional:  BP 100/62 (BP Location: Left Arm, Patient Position: Sitting, Cuff Size: Normal)   Pulse 82   Temp 97.9 F (36.6 C)   Ht 5\' 10"  (1.778 m)   Wt 260 lb (117.9 kg)   SpO2 93% Comment: 2L pulse  BMI 37.31 kg/m   Past Medical History:  Pneumonia with septic shock 2016, PAF, CAD, HTN, HLD, CVA, combined CHF, pulmonary hypertension, CKD, DM, Gout, VT s/p ICD  Brief Summary:  [name] is a 63 y.o. male with chronic respiratory failure.  He had Echo and RHC over the Summer.  PFT hasn't been scheduled yet.  Not having cough, wheeze, sputum, chest pain, fever, hemoptysis, or leg swelling.  Uses 2 to 3 liters oxygen 24/7.  Received information about an [employer] POC and wanted to know if this is a better option for him.  He already has POC and stationery concentrated at home through DME.  He was told by cardiology that he needs assessment at [facility] - still being set up.  Physical Exam:   Appearance - wearing oxygen  ENMT - no sinus tenderness, no nasal discharge, no oral exudate  Neck - no masses, trachea midline, no thyromegaly, no elevation in JVP  Respiratory - normal appearance of chest wall, normal respiratory effort w/o accessory muscle use, no dullness on percussion, no wheezing or rales  CV - s1s2 regular rate and rhythm, no murmurs, no peripheral edema, radial pulses symmetric  GI - soft, non tender  Lymph - no adenopathy noted in neck and axillary areas  MSK - sitting in wheelchair  Ext - no cyanosis, clubbing, or joint inflammation noted  Skin - no rashes, lesions, or ulcers  Neuro - normal strength, oriented x 3  Psych - normal mood and affect   CMP Latest Ref Rng & Units 01/12/2019 01/05/2019 01/04/2019  Glucose 70 - 99 mg/dL 98 93 105(H)  BUN 8 - 23 mg/dL  45(H) 64(H) 66(H)  Creatinine 0.61 - 1.24 mg/dL 2.97(H) 3.21(H) 3.30(H)  Sodium 135 - 145 mmol/L 140 136 137  Potassium 3.5 - 5.1 mmol/L 3.8 4.3 4.7  Chloride 98 - 111 mmol/L 104 99 103  CO2 22 - 32 mmol/L 27 26 24   Calcium 8.9 - 10.3 mg/dL 8.9 9.0 9.0  Total Protein 6.0 - 8.3 g/dL - - -  Total Bilirubin 0.2 - 1.2 mg/dL - - -  Alkaline Phos 39 - 117 U/L - - -  AST 0 - 37 U/L - - -  ALT 0 - 53 U/L - - -    CBC Latest Ref Rng & Units 01/12/2019 01/05/2019 01/04/2019  WBC 4.0 - 10.5 K/uL 5.4 5.4 5.9  Hemoglobin 13.0 - 17.0 g/dL 12.2(L) 11.6(L) 11.2(L)  Hematocrit 39.0 - 52.0 % 39.0 36.2(L) 36.0(L)  Platelets 150 - 400 K/uL 168 161 168    Assessment/Plan:   Chronic respiratory failure. - in setting of LV/RV systolic CHF with peripheral edema - continue 2 to 3 liters oxygen 24/7 - will make sure he has PFT scheduled - discussed pros/cons of various POC devices - he will call if he wants to switch to an [employer] POC  Chronic combined CHF with hx of VT, CAD, PAF, HTN, Pulmonary hypertension. - f/u with CHF team - has  referral from cardiology to [facility]  CKD 4. - f/u with nephrology   Patient Instructions  Will make sure you get schedule for pulmonary function test  Follow up in 6 months    Chesley Mires, MD [place] Pager: [phone] 01/24/2019, 12:16 PM  Flow Sheet     Pulmonary tests:  V/Q 10/07/17 >> very low probability for PE  Sleep tests:  PSG 07/29/04 >> AHI 8, SpO2 low 90% PSG 11/22/18 >> AHI 1.7, SpO2 low 85%.  Used 2 liters oxygen.  Cardiac tests:  Echo 07/28/17 >> EF 20 to 25%, mod LVH, ascending aorta 43 mmHg, PAS 40 mmHg RHC 10/04/17 >> RA mean 19, RV 72/23, PA 74/37 mean 51, PCWP 19, CI 1.9, PVR 6.8 WU Echo 11/17/18 >> EF 20 to 25%, severe RV systolic dysfunction RHC/LHC 12/06/18 >> non obstructive CAD, EF 10 to 15%, RA 10, RV 74/14, PA 73/36/48, PCW 9, FI 2.4, CI 1.7, PVR 6.8  Medications:   Allergies as of 01/24/2019   No Known  Allergies     Medication List       Accurate as of January 24, 2019 12:16 PM. If you have any questions, ask your nurse or doctor.        acetaminophen 500 MG tablet Commonly known as: TYLENOL Take 500-1,000 mg by mouth every 6 (six) hours as needed (for bilateral knee pain).   amiodarone 200 MG tablet Commonly known as: PACERONE Take 1 tablet (200 mg total) by mouth 2 (two) times daily.   carvedilol 3.125 MG tablet Commonly known as: COREG Take 1 tablet (3.125 mg total) by mouth 2 (two) times daily with a meal.   isosorbide-hydrALAZINE 20-37.5 MG tablet Commonly known as: BIDIL Take 0.5 tablets by mouth 3 (three) times daily.   Lantus SoloStar 100 UNIT/ML Solostar Pen Generic drug: Insulin Glargine Inject 10 units into the skin at bedtime   Lipitor 80 MG tablet Generic drug: atorvastatin Take 80 mg by mouth daily at 6 PM.   nitroGLYCERIN 0.4 MG SL tablet Commonly known as: NITROSTAT Place 0.4 mg under the tongue every 5 (five) minutes as needed for chest pain.   OXYGEN Inhale into the lungs. 2L continuous; 3L on pulse machine   Pen Needles 31G X 5 MM Misc 100 each by Does not apply route 2 (two) times daily.   Insulin Pen Needle 31G X 8 MM Misc Commonly known as: B-D ULTRAFINE III SHORT PEN USE ONCE DAILY WITH INSULIN   torsemide 20 MG tablet Commonly known as: DEMADEX Take 2 tablets (40 mg total) by mouth daily.   warfarin 5 MG tablet Commonly known as: COUMADIN Take as directed by the anticoagulation clinic. If you are unsure how to take this medication, talk to your nurse or doctor. Original instructions: Take 7.5 mg by mouth on Monday, Wednesday and Friday and take 5 mg on Tuesday, Thursday, Saturday and Sunday in the evening       Past Surgical History:  He  has a past surgical history that includes Cardiac catheterization; Cardiac defibrillator placement; Lipoma excision; left and right heart catheterization with coronary angiogram (N/A,  01/03/2013); implantable cardioverter defibrillator (icd) generator change (N/A, 01/15/2014); RIGHT/LEFT HEART CATH AND CORONARY ANGIOGRAPHY (N/A, 10/04/2017); RIGHT/LEFT HEART CATH AND CORONARY ANGIOGRAPHY (N/A, 12/06/2018); and Cardioversion (N/A, 01/03/2019).  Family History:  His family history includes Coronary artery disease in his mother; Heart attack in his maternal grandmother, maternal uncle, and mother.  Social History:  He  reports that he quit smoking  about 32 years ago. He has never used smokeless tobacco. He reports that he does not drink alcohol or use drugs.

## 2019-01-24 NOTE — Patient Instructions (Signed)
Will make sure you get schedule for pulmonary function test  Follow up in 6 months

## 2019-01-25 ENCOUNTER — Ambulatory Visit (INDEPENDENT_AMBULATORY_CARE_PROVIDER_SITE_OTHER): Payer: Medicare Other | Admitting: Internal Medicine

## 2019-01-25 ENCOUNTER — Encounter: Payer: Self-pay | Admitting: Internal Medicine

## 2019-01-25 VITALS — BP 122/84 | HR 51 | Temp 98.1°F | Ht 70.0 in

## 2019-01-25 DIAGNOSIS — J9611 Chronic respiratory failure with hypoxia: Secondary | ICD-10-CM | POA: Diagnosis not present

## 2019-01-25 DIAGNOSIS — N183 Chronic kidney disease, stage 3 unspecified: Secondary | ICD-10-CM

## 2019-01-25 DIAGNOSIS — I5042 Chronic combined systolic (congestive) and diastolic (congestive) heart failure: Secondary | ICD-10-CM | POA: Diagnosis not present

## 2019-01-25 DIAGNOSIS — I10 Essential (primary) hypertension: Secondary | ICD-10-CM | POA: Diagnosis not present

## 2019-01-25 DIAGNOSIS — E119 Type 2 diabetes mellitus without complications: Secondary | ICD-10-CM | POA: Diagnosis not present

## 2019-01-25 DIAGNOSIS — I270 Primary pulmonary hypertension: Secondary | ICD-10-CM | POA: Diagnosis not present

## 2019-01-25 DIAGNOSIS — Z794 Long term (current) use of insulin: Secondary | ICD-10-CM

## 2019-01-25 LAB — POCT GLYCOSYLATED HEMOGLOBIN (HGB A1C): Hemoglobin A1C: 5.6 % (ref 4.0–5.6)

## 2019-01-25 NOTE — Assessment & Plan Note (Signed)
stable overall by history and exam, recent data reviewed with pt, and pt to continue medical treatment as before,  to f/u any worsening symptoms or concerns  

## 2019-01-25 NOTE — Patient Instructions (Signed)
Your A1c was OK today  Please continue all other medications as before, and refills have been done if requested.  Please have the pharmacy call with any other refills you may need.  Please continue your efforts at being more active, low cholesterol diet, and weight control.  Please keep your appointments with your specialists as you may have planned  Please return in 6 months, or sooner if needed 

## 2019-01-25 NOTE — Progress Notes (Signed)
Subjective:    Patient ID: [name], male    DOB: July 11, 1955, 63 y.o.   MRN: [id number]  HPI  Here to f/u; overall doing ok,  Pt denies chest pain, increasing sob or doe, wheezing, orthopnea, PND, increased LE swelling, palpitations, dizziness or syncope.  Pt denies new neurological symptoms such as new headache, or facial or extremity weakness or numbness.  Pt denies polydipsia, polyuria, or low sugar episode.  Pt states overall good compliance with meds, mostly trying to follow appropriate diet, wears home o2 2L to 3L with activity [place] continuous.  CBGs ave about 107 in the AM.  No new complaints Past Medical History:  Diagnosis Date  . CAD (coronary artery disease)    a. Initial nonobst by cath 2009. b. Cath 01/2013 in setting of VT storm: obstructive distal Cx disease (small and terminates in the AV groove, unlikely to cause significant ischemia or electrical instability), nonobstructive RCA disease, EF 15-20%.   . Cerebrovascular accident [facility])    a. 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 Chronic systolic CHF (congestive heart failure) ([place])    a. Likely NICM (out of proportion to CAD). b. 2009 - EF 25-30% by echo, 01/2013: 15-20% by cath. c. 11/2014 Echo: EF 35-40%, Gr1 DD, mild MR, mod TR, PASP 81mmHg.  . CKD (chronic kidney disease), stage II   . Dyslipidemia   . Gout   . HTN (hypertension)   . Hypokalemia   . ICD (implantable cardiac defibrillator) in place   . Insulin dependent diabetes mellitus ([place])   . Lipoma   . Nonischemic cardiomyopathy ([place])    a.  11/2014 Echo: EF 35-40%, Gr1 DD, mild MR, mod TR, PASP 69mmHg.  . OSA (obstructive sleep apnea)    does not wear cpap  . PAF (paroxysmal atrial fibrillation) ([place])    a. Noted 05/2008 by EKG;  b. CHA2DS2VASc = 5-6-->coumadin.  . Paroxysmal VT ([place])    a. s/p St. Jude ICD 2007. b. H/o paroxysmal VT/VF including VT storm 12/2012 admission prompting amio initiation;  c. 01/2013 ICD upgrade SJM 1411-36Q Ellipse VR single lead ICD.   [name] Pulmonary HTN ([place])    a. Mild by cath 01/2013.   Past Surgical History:  Procedure Laterality Date  . CARDIAC CATHETERIZATION     Nonobstructive coronary disease 2009  . CARDIAC DEFIBRILLATOR PLACEMENT     ICD-St. Jude  . CARDIOVERSION N/A 01/03/2019   Procedure: CARDIOVERSION;  Surgeon: Jolaine Artist, MD;  Location: [facility] ENDOSCOPY;  Service: Cardiovascular;  Laterality: N/A;  . IMPLANTABLE CARDIOVERTER DEFIBRILLATOR (ICD) GENERATOR CHANGE N/A 01/15/2014   Procedure: ICD GENERATOR CHANGE;  Surgeon: Evans Lance, MD;  Location: [facility] CATH LAB;  Service: Cardiovascular;  Laterality: N/A;  . LEFT AND RIGHT HEART CATHETERIZATION WITH CORONARY ANGIOGRAM N/A 01/03/2013   Procedure: LEFT AND RIGHT HEART CATHETERIZATION WITH CORONARY ANGIOGRAM;  Surgeon: Peter M Martinique, MD;  Location: [facility] CATH LAB;  Service: Cardiovascular;  Laterality: N/A;  . LIPOMA EXCISION    . RIGHT/LEFT HEART CATH AND CORONARY ANGIOGRAPHY N/A 10/04/2017   Procedure: RIGHT/LEFT HEART CATH AND CORONARY ANGIOGRAPHY;  Surgeon: Larey Dresser, MD;  Location: [place] CV LAB;  Service: Cardiovascular;  Laterality: N/A;  . RIGHT/LEFT HEART CATH AND CORONARY ANGIOGRAPHY N/A 12/06/2018   Procedure: RIGHT/LEFT HEART CATH AND CORONARY ANGIOGRAPHY;  Surgeon: Jolaine Artist, MD;  Location: [place] CV LAB;  Service: Cardiovascular;  Laterality: N/A;    reports that he quit smoking about 32 years ago.  He has never used smokeless tobacco. He reports that he does not drink alcohol or use drugs. family history includes Coronary artery disease in his mother; Heart attack in his maternal grandmother, maternal uncle, and mother. No Known Allergies Current Outpatient Medications on File Prior to Visit  Medication Sig Dispense Refill  . acetaminophen (TYLENOL) 500 MG tablet Take 500-1,000 mg by mouth every 6 (six) hours as needed (for bilateral knee pain).    [name] amiodarone (PACERONE) 200 MG tablet Take 1 tablet (200 mg total) by mouth 2  (two) times daily. 60 tablet 0  . atorvastatin (LIPITOR) 80 MG tablet Take 80 mg by mouth daily at 6 PM.    . carvedilol (COREG) 3.125 MG tablet Take 1 tablet (3.125 mg total) by mouth 2 (two) times daily with a meal. 60 tablet 0  . Insulin Glargine (LANTUS SOLOSTAR) 100 UNIT/ML Solostar Pen Inject 10 units into the skin at bedtime    . Insulin Pen Needle (B-D ULTRAFINE III SHORT PEN) 31G X 8 MM MISC USE ONCE DAILY WITH INSULIN 100 each 3  . Insulin Pen Needle (PEN NEEDLES) 31G X 5 MM MISC 100 each by Does not apply route 2 (two) times daily. 100 each 5  . isosorbide-hydrALAZINE (BIDIL) 20-37.5 MG tablet Take 0.5 tablets by mouth 3 (three) times daily. 45 tablet 3  . nitroGLYCERIN (NITROSTAT) 0.4 MG SL tablet Place 0.4 mg under the tongue every 5 (five) minutes as needed for chest pain.    . OXYGEN Inhale into the lungs. 2L continuous; 3L on pulse machine    . torsemide (DEMADEX) 20 MG tablet Take 2 tablets (40 mg total) by mouth daily. 30 tablet 0  . warfarin (COUMADIN) 5 MG tablet Take 7.5 mg by mouth on Monday, Wednesday and Friday and take 5 mg on Tuesday, Thursday, Saturday and Sunday in the evening     No current facility-administered medications on file prior to visit.    Review of Systems  Constitutional: Negative for other unusual diaphoresis or sweats HENT: Negative for ear discharge or swelling Eyes: Negative for other worsening visual disturbances Respiratory: Negative for stridor or other swelling  Gastrointestinal: Negative for worsening distension or other blood Genitourinary: Negative for retention or other urinary change Musculoskeletal: Negative for other MSK pain or swelling Skin: Negative for color change or other new lesions Neurological: Negative for worsening tremors and other numbness  Psychiatric/Behavioral: Negative for worsening agitation or other fatigue All otherwise neg per pt     Objective:   Physical Exam BP 122/84   Pulse (!) 51   Temp 98.1 F (36.7  C) (Oral)   Ht 5\' 10"  (1.778 m)   SpO2 97%   BMI 37.31 kg/m  VS noted,  Constitutional: Pt appears in NAD HENT: Head: NCAT.  Right Ear: External ear normal.  Left Ear: External ear normal.  Eyes: . Pupils are equal, round, and reactive to light. Conjunctivae and EOM are normal Nose: without d/c or deformity Neck: Neck supple. Gross normal ROM Cardiovascular: Normal rate and regular rhythm.   Pulmonary/Chest: Effort normal and breath sounds without rales or wheezing.  Abd:  Soft, NT, ND, + BS, no organomegaly Neurological: Pt is alert. At baseline orientation, motor grossly intact Skin: Skin is warm. No rashes, other new lesions, no LE edema Psychiatric: Pt behavior is normal without agitation  All otherwise neg per pt   Lab Results  Component Value Date   WBC 5.4 01/12/2019   HGB 12.2 (L) 01/12/2019  HCT 39.0 01/12/2019   PLT 168 01/12/2019   GLUCOSE 98 01/12/2019   CHOL 137 02/08/2018   TRIG 78.0 02/08/2018   HDL 33.00 (L) 02/08/2018   LDLDIRECT 194.6 05/09/2007   LDLCALC 88 02/08/2018   ALT 42 11/01/2018   AST 45 (H) 11/01/2018   NA 140 01/12/2019   K 3.8 01/12/2019   CL 104 01/12/2019   CREATININE 2.97 (H) 01/12/2019   BUN 45 (H) 01/12/2019   CO2 27 01/12/2019   TSH 1.14 07/21/2018   PSA 0.37 07/21/2018   INR 2.3 01/20/2019   HGBA1C 6.5 07/21/2018   MICROALBUR 30.7 (H) 07/21/2018     POCT glycosylated hemoglobin (Hb A1C) Order: [id number] Status:  Final result Visible to patient:  No (not released) Dx:  Type 2 diabetes mellitus without comp...  Ref Range & Units 11:02 (01/25/19) 29mo ago (07/21/18) 9mo ago (02/08/18) 75yr ago (08/04/17) 43yr ago (01/19/17) 65yr ago (07/15/16) 57yr ago (01/15/16)  Hemoglobin A1C 4.0 - 5.6 % 5.6  6.5 R, CM  5.5 R, CM  5.6 R  5.7 R, CM  5.6 R, CM  5.5 R,            Assessment & Plan:

## 2019-01-27 ENCOUNTER — Ambulatory Visit (INDEPENDENT_AMBULATORY_CARE_PROVIDER_SITE_OTHER): Payer: Medicare Other | Admitting: General Practice

## 2019-01-27 ENCOUNTER — Other Ambulatory Visit: Payer: Self-pay

## 2019-01-27 ENCOUNTER — Ambulatory Visit (INDEPENDENT_AMBULATORY_CARE_PROVIDER_SITE_OTHER): Payer: Medicare Other | Admitting: Family Medicine

## 2019-01-27 ENCOUNTER — Other Ambulatory Visit (HOSPITAL_COMMUNITY): Payer: Self-pay

## 2019-01-27 DIAGNOSIS — Z7901 Long term (current) use of anticoagulants: Secondary | ICD-10-CM | POA: Diagnosis not present

## 2019-01-27 DIAGNOSIS — M170 Bilateral primary osteoarthritis of knee: Secondary | ICD-10-CM | POA: Diagnosis not present

## 2019-01-27 DIAGNOSIS — I4891 Unspecified atrial fibrillation: Secondary | ICD-10-CM | POA: Diagnosis not present

## 2019-01-27 DIAGNOSIS — Z8679 Personal history of other diseases of the circulatory system: Secondary | ICD-10-CM | POA: Diagnosis not present

## 2019-01-27 LAB — POCT INR: INR: 3.1 — AB (ref 2.0–3.0)

## 2019-01-27 NOTE — Patient Instructions (Signed)
Pre visit review using our clinic review tool, if applicable. No additional management support is needed unless otherwise documented below in the visit note.  Hold coumadin today and then continue to take 1 tablet daily.  Re-check in 2 weeks.  Pt is taking amiodarone. Patient will decrease amiodarone on 9/30.  PT to see Dr. Tamala Julian today

## 2019-01-27 NOTE — Progress Notes (Signed)
Medical screening examination/treatment/procedure(s) were performed by non-physician practitioner and as supervising physician I was immediately available for consultation/collaboration. I agree with above. James John, MD   

## 2019-01-27 NOTE — Patient Instructions (Signed)
See me again in 5-6 weeks for visco injections

## 2019-01-27 NOTE — Progress Notes (Signed)
[name] [address] [place], [place] [address] Phone: [phone] Subjective:   [name], am serving as a scribe for Dr. Hulan Saas.  I'm seeing this patient by the request  of:    CC: Bilateral knee pain  POE:[id number]   04/2018 Injection given today.  Discussed icing regimen and home exercise.  Discussed which activities of doing which wants to avoid.  Follow-up again in 4 to 8 weeks could be candidate for Visco supplementation.  Update 01/27/2019 [name] is a 63 y.o. male coming in with complaint of bilateral knee pain. Is having pain throughout the entire joint. Has been having pain for a while now but has been having other health issues.  Patient has been in the emergency department for acute kidney injury and has had to have a cardioversion.  Still having difficulty with his heart but is seeing a specialist.  Patient is now on oxygen continuously.       Past Medical History:  Diagnosis Date  . CAD (coronary artery disease)    a. Initial nonobst by cath 2009. b. Cath 01/2013 in setting of VT storm: obstructive distal Cx disease (small and terminates in the AV groove, unlikely to cause significant ischemia or electrical instability), nonobstructive RCA disease, EF 15-20%.   . Cerebrovascular accident [facility])    a. 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 Chronic systolic CHF (congestive heart failure) ([place])    a. Likely NICM (out of proportion to CAD). b. 2009 - EF 25-30% by echo, 01/2013: 15-20% by cath. c. 11/2014 Echo: EF 35-40%, Gr1 DD, mild MR, mod TR, PASP 66mmHg.  . CKD (chronic kidney disease), stage II   . Dyslipidemia   . Gout   . HTN (hypertension)   . Hypokalemia   . ICD (implantable cardiac defibrillator) in place   . Insulin dependent diabetes mellitus ([place])   . Lipoma   . Nonischemic cardiomyopathy ([place])    a.  11/2014 Echo: EF 35-40%, Gr1 DD, mild MR, mod TR, PASP 31mmHg.  . OSA (obstructive sleep apnea)    does  not wear cpap  . PAF (paroxysmal atrial fibrillation) ([place])    a. Noted 05/2008 by EKG;  b. CHA2DS2VASc = 5-6-->coumadin.  . Paroxysmal VT ([place])    a. s/p St. Jude ICD 2007. b. H/o paroxysmal VT/VF including VT storm 12/2012 admission prompting amio initiation;  c. 01/2013 ICD upgrade SJM 1411-36Q Ellipse VR single lead ICD.  [name] Pulmonary HTN ([place])    a. Mild by cath 01/2013.   Past Surgical History:  Procedure Laterality Date  . CARDIAC CATHETERIZATION     Nonobstructive coronary disease 2009  . CARDIAC DEFIBRILLATOR PLACEMENT     ICD-St. Jude  . CARDIOVERSION N/A 01/03/2019   Procedure: CARDIOVERSION;  Surgeon: Jolaine Artist, MD;  Location: [facility] ENDOSCOPY;  Service: Cardiovascular;  Laterality: N/A;  . IMPLANTABLE CARDIOVERTER DEFIBRILLATOR (ICD) GENERATOR CHANGE N/A 01/15/2014   Procedure: ICD GENERATOR CHANGE;  Surgeon: Evans Lance, MD;  Location: [facility] CATH LAB;  Service: Cardiovascular;  Laterality: N/A;  . LEFT AND RIGHT HEART CATHETERIZATION WITH CORONARY ANGIOGRAM N/A 01/03/2013   Procedure: LEFT AND RIGHT HEART CATHETERIZATION WITH CORONARY ANGIOGRAM;  Surgeon: Peter M Martinique, MD;  Location: [facility] CATH LAB;  Service: Cardiovascular;  Laterality: N/A;  . LIPOMA EXCISION    . RIGHT/LEFT HEART CATH AND CORONARY ANGIOGRAPHY N/A 10/04/2017   Procedure: RIGHT/LEFT HEART CATH AND CORONARY ANGIOGRAPHY;  Surgeon: Larey Dresser, MD;  Location: [place] CV LAB;  Service: Cardiovascular;  Laterality: N/A;  . RIGHT/LEFT HEART CATH AND CORONARY ANGIOGRAPHY N/A 12/06/2018   Procedure: RIGHT/LEFT HEART CATH AND CORONARY ANGIOGRAPHY;  Surgeon: Jolaine Artist, MD;  Location: [place] CV LAB;  Service: Cardiovascular;  Laterality: N/A;   Social History   Socioeconomic History  . Marital status: Married    Spouse name: Not on file  . Number of children: 2  . Years of education: Not on file  . Highest education level: Not on file  Occupational History  . Occupation: medically retired due to  heart failure  Social Needs  . Financial resource strain: Not on file  . Food insecurity    Worry: Not on file    Inability: Not on file  . Transportation needs    Medical: Yes    Non-medical: [name]  Tobacco Use  . Smoking status: Former Smoker    Quit date: 05/04/1986    Years since quitting: 32.7  . Smokeless tobacco: Never Used  . Tobacco comment: Quit smoking 30 yrs ago. Smoked as teenager less than 1/2 ppd. Smoked x 4 years.  Substance and Sexual Activity  . Alcohol use: [name]    Comment: occasionally  . Drug use: [name]  . Sexual activity: Never  Lifestyle  . Physical activity    Days per week: 0 days    Minutes per session: 0 min  . Stress: Not at all  Relationships  . Social Herbalist on phone: Not on file    Gets together: Not on file    Attends religious service: Not on file    Active member of club or organization: Not on file    Attends meetings of clubs or organizations: Not on file    Relationship status: Not on file  Other Topics Concern  . Not on file  Social History Narrative  . Not on file   [name] Known Allergies Family History  Problem Relation Age of Onset  . Coronary artery disease Mother   . Heart attack Mother   . Heart attack Maternal Uncle   . Heart attack Maternal Grandmother     Current Outpatient Medications (Endocrine & Metabolic):  [name]  Insulin Glargine (LANTUS SOLOSTAR) 100 UNIT/ML Solostar Pen, Inject 10 units into the skin at bedtime  Current Outpatient Medications (Cardiovascular):  .  amiodarone (PACERONE) 200 MG tablet, Take 1 tablet (200 mg total) by mouth 2 (two) times daily. [name]  atorvastatin (LIPITOR) 80 MG tablet, Take 80 mg by mouth daily at 6 PM. .  carvedilol (COREG) 3.125 MG tablet, Take 1 tablet (3.125 mg total) by mouth 2 (two) times daily with a meal. .  isosorbide-hydrALAZINE (BIDIL) 20-37.5 MG tablet, Take 0.5 tablets by mouth 3 (three) times daily. .  nitroGLYCERIN (NITROSTAT) 0.4 MG SL tablet, Place 0.4 mg under the  tongue every 5 (five) minutes as needed for chest pain. [name]  torsemide (DEMADEX) 20 MG tablet, Take 2 tablets (40 mg total) by mouth daily.   Current Outpatient Medications (Analgesics):  .  acetaminophen (TYLENOL) 500 MG tablet, Take 500-1,000 mg by mouth every 6 (six) hours as needed (for bilateral knee pain).  Current Outpatient Medications (Hematological):  .  warfarin (COUMADIN) 5 MG tablet, Take 7.5 mg by mouth on Monday, Wednesday and Friday and take 5 mg on Tuesday, Thursday, Saturday and Sunday in the evening  Current Outpatient Medications (Other):  [name]  Insulin Pen Needle (B-D ULTRAFINE III SHORT PEN) 31G X  8 MM MISC, USE ONCE DAILY WITH INSULIN .  Insulin Pen Needle (PEN NEEDLES) 31G X 5 MM MISC, 100 each by Does not apply route 2 (two) times daily. .  OXYGEN, Inhale into the lungs. 2L continuous; 3L on pulse machine    Past medical history, social, surgical and family history all reviewed in electronic medical record.  [name] pertanent information unless stated regarding to the chief complaint.   Review of Systems:  [name] headache, visual changes, nausea, vomiting, diarrhea, constipation, dizziness, abdominal pain, skin rash, fevers, chills, night sweats, weight loss, swollen lymph nodes,  chest pain, shortness of breath, mood changes.  Positive muscle aches, body aches, joint swelling  Objective  Blood pressure 120/66, pulse 76, height 5\' 10"  (1.778 m), weight 260 lb (117.9 kg), SpO2 96 %.    General: [name] apparent distress alert and oriented x3 mood and affect normal, dressed appropriately.  Morbidly obese HEENT: Pupils equal, extraocular movements intact  Respiratory: Patient's speak in full sentences and does not appear short of breath patient is on oxygen under facemask Cardiovascular: 2+ lower extremity edema, non tender, [name] erythema  Skin: Warm dry intact with [name] signs of infection or rash on extremities or on axial skeleton.  Abdomen: Soft nontender  Neuro: Cranial nerves II  through XII are intact, neurovascularly intact in all extremities with 2+ DTRs and 2+ pulses.  Lymph: [name] lymphadenopathy of posterior or anterior cervical chain or axillae bilaterally.  Gait patient is in a wheelchair MSK:   Knee: Bilateral valgus deformity noted. Large thigh to calf ratio.  Difficult to assess secondary to patient's body habitus Tender to palpation over medial and PF joint line.  ROM full in flexion and extension and lower leg rotation. instability with valgus force.  painful patellar compression. Patellar glide with moderate crepitus. Patellar and quadriceps tendons unremarkable. Hamstring and quadriceps strength is normal.  After informed written and verbal consent, patient was seated on exam table. Right knee was prepped with alcohol swab and utilizing anterolateral approach, patient's right knee space was injected with 4:1  marcaine 0.5%: Kenalog 40mg /dL. Patient tolerated the procedure well without immediate complications.  After informed written and verbal consent, patient was seated on exam table. Left knee was prepped with alcohol swab and utilizing anterolateral approach, patient's left knee space was injected with 4:1  marcaine 0.5%: Kenalog 40mg /dL. Patient tolerated the procedure well without immediate complications.    Impression and Recommendations:     This case required medical decision making of moderate complexity. The above documentation has been reviewed and is accurate and complete Lyndal Pulley, DO       Note: This dictation was prepared with Dragon dictation along with smaller phrase technology. Any transcriptional errors that result from this process are unintentional.

## 2019-01-28 ENCOUNTER — Encounter: Payer: Self-pay | Admitting: Family Medicine

## 2019-01-28 NOTE — Assessment & Plan Note (Signed)
Repeat injection today.  Responded very well.  Difficult for anything else for treatment options secondary to patient's comorbidities.  Hoping that this will be beneficial and could be a candidate for possible viscosupplementation.  Patient feels like he does have maneuverability at home.  Hoping that the injections will help out a little more.  Feels like he has good walkers to help him with ambulation.  Follow-up again in  5 to 6 weeks

## 2019-02-03 ENCOUNTER — Other Ambulatory Visit: Payer: Self-pay | Admitting: Internal Medicine

## 2019-02-03 MED ORDER — CARVEDILOL 3.125 MG PO TABS: 3.1250 mg | ORAL_TABLET | Freq: Two times a day (BID) | ORAL | 2 refills | Status: DC

## 2019-02-03 NOTE — Telephone Encounter (Signed)
Requested medication (s) are due for refill today: yes  Requested medication (s) are on the active medication list: yes  Last refill:  01/02/2019  Future visit scheduled: no  Notes to clinic:  Ordering provider and pcp are different   Requested Prescriptions  Pending Prescriptions Disp Refills   carvedilol (COREG) 3.125 MG tablet 60 tablet 0    Sig: Take 1 tablet (3.125 mg total) by mouth 2 (two) times daily with a meal.     Cardiovascular:  Beta Blockers Passed - 02/03/2019  9:40 AM      Passed - Last BP in normal range    BP Readings from Last 1 Encounters:  01/27/19 120/66         Passed - Last Heart Rate in normal range    Pulse Readings from Last 1 Encounters:  01/27/19 76         Passed - Valid encounter within last 6 months    Recent Outpatient Visits          1 week ago Primary osteoarthritis of both [place], Olevia Bowens, DO   1 week ago CKD (chronic kidney disease), stage III [facility])   [facility] -Georges Mouse, MD   6 months ago Wellness examination   [place] John, James W, MD   9 months ago Primary osteoarthritis of both knees   [place], [place], DO   10 months ago Primary osteoarthritis of both [place], [place], [place]

## 2019-02-03 NOTE — Telephone Encounter (Signed)
Copied from [place] [id number]. Topic: Quick Communication - Rx Refill/Question >> Feb 03, 2019  9:35 AM Yvette Rack wrote: Medication: carvedilol (COREG) 3.125 MG tablet  Has the patient contacted their pharmacy? no  Preferred Pharmacy (with phone number or street name): [id number] - [place], [place] [phone] (Phone)  [phone] (Fax)  Agent: Please be advised that RX refills may take up to 3 business days. We ask that you follow-up with your pharmacy.

## 2019-02-08 ENCOUNTER — Telehealth (HOSPITAL_COMMUNITY): Payer: Self-pay | Admitting: Cardiology

## 2019-02-08 DIAGNOSIS — I5042 Chronic combined systolic (congestive) and diastolic (congestive) heart failure: Secondary | ICD-10-CM

## 2019-02-08 NOTE — Telephone Encounter (Signed)
Increase torsemide to 40 mg twice a day for 2 days then back to torsemide 40 mg dailu  Follow up  Monday for BMET   Please call.   Gurjot Brisco NP-C  2:26 PM

## 2019-02-08 NOTE — Telephone Encounter (Signed)
Patient called with concerns of increased fluid, +BLE into knees, weight increased (264.5lb). denies increased SOB or CP.he is still able to rest comfortably at night Reports compliance with medications Torsemide 40/40 Request a dose of IV lasix  Advised will forward to provider for further instructions

## 2019-02-08 NOTE — Telephone Encounter (Signed)
Detailed message left for patient with medication instructions, will follow up with patient in the AM

## 2019-02-09 NOTE — Telephone Encounter (Signed)
LMOM

## 2019-02-10 ENCOUNTER — Other Ambulatory Visit: Payer: Self-pay

## 2019-02-10 ENCOUNTER — Ambulatory Visit (INDEPENDENT_AMBULATORY_CARE_PROVIDER_SITE_OTHER): Payer: Medicare Other | Admitting: General Practice

## 2019-02-10 DIAGNOSIS — Z7901 Long term (current) use of anticoagulants: Secondary | ICD-10-CM | POA: Diagnosis not present

## 2019-02-10 DIAGNOSIS — I4891 Unspecified atrial fibrillation: Secondary | ICD-10-CM

## 2019-02-10 DIAGNOSIS — Z8679 Personal history of other diseases of the circulatory system: Secondary | ICD-10-CM

## 2019-02-10 LAB — POCT INR: INR: 3.3 — AB (ref 2.0–3.0)

## 2019-02-10 NOTE — Telephone Encounter (Signed)
Pt aware and voiced understanding  repeat labs 10/14

## 2019-02-10 NOTE — Patient Instructions (Addendum)
Pre visit review using our clinic review tool, if applicable. No additional management support is needed unless otherwise documented below in the visit note.  Hold coumadin today and then take 1 tablet daily except take 1/2 tablet on Fridays.  Re-check in 3 weeks.

## 2019-02-10 NOTE — Addendum Note (Signed)
Addended by: Kerry Dory on: 02/10/2019 10:53 AM   Modules accepted: Orders

## 2019-02-12 NOTE — Progress Notes (Signed)
Agree with management.  [name] J [name], MD  

## 2019-02-13 ENCOUNTER — Emergency Department (HOSPITAL_COMMUNITY): Payer: Medicare Other

## 2019-02-13 ENCOUNTER — Other Ambulatory Visit: Payer: Self-pay

## 2019-02-13 ENCOUNTER — Inpatient Hospital Stay (HOSPITAL_COMMUNITY)
Admission: EM | Admit: 2019-02-13 | Discharge: 2019-03-03 | DRG: 252 | Disposition: A | Discharge status: Home Health | Payer: Medicare Other | Attending: Internal Medicine | Admitting: Internal Medicine

## 2019-02-13 ENCOUNTER — Telehealth (HOSPITAL_COMMUNITY): Payer: Self-pay | Admitting: *Deleted

## 2019-02-13 ENCOUNTER — Encounter (HOSPITAL_COMMUNITY): Payer: Self-pay

## 2019-02-13 DIAGNOSIS — Z6841 Body Mass Index (BMI) 40.0 and over, adult: Secondary | ICD-10-CM | POA: Diagnosis not present

## 2019-02-13 DIAGNOSIS — R0602 Shortness of breath: Secondary | ICD-10-CM | POA: Diagnosis not present

## 2019-02-13 DIAGNOSIS — I5043 Acute on chronic combined systolic (congestive) and diastolic (congestive) heart failure: Secondary | ICD-10-CM | POA: Diagnosis not present

## 2019-02-13 DIAGNOSIS — Z992 Dependence on renal dialysis: Secondary | ICD-10-CM | POA: Diagnosis not present

## 2019-02-13 DIAGNOSIS — I454 Nonspecific intraventricular block: Secondary | ICD-10-CM | POA: Diagnosis present

## 2019-02-13 DIAGNOSIS — Z4901 Encounter for fitting and adjustment of extracorporeal dialysis catheter: Secondary | ICD-10-CM | POA: Diagnosis not present

## 2019-02-13 DIAGNOSIS — E785 Hyperlipidemia, unspecified: Secondary | ICD-10-CM | POA: Diagnosis present

## 2019-02-13 DIAGNOSIS — I480 Paroxysmal atrial fibrillation: Secondary | ICD-10-CM | POA: Diagnosis not present

## 2019-02-13 DIAGNOSIS — I472 Ventricular tachycardia: Secondary | ICD-10-CM | POA: Diagnosis not present

## 2019-02-13 DIAGNOSIS — Z7189 Other specified counseling: Secondary | ICD-10-CM

## 2019-02-13 DIAGNOSIS — I132 Hypertensive heart and chronic kidney disease with heart failure and with stage 5 chronic kidney disease, or end stage renal disease: Secondary | ICD-10-CM | POA: Diagnosis not present

## 2019-02-13 DIAGNOSIS — Z794 Long term (current) use of insulin: Secondary | ICD-10-CM | POA: Diagnosis not present

## 2019-02-13 DIAGNOSIS — E1122 Type 2 diabetes mellitus with diabetic chronic kidney disease: Secondary | ICD-10-CM | POA: Diagnosis not present

## 2019-02-13 DIAGNOSIS — Z95810 Presence of automatic (implantable) cardiac defibrillator: Secondary | ICD-10-CM

## 2019-02-13 DIAGNOSIS — Z01810 Encounter for preprocedural cardiovascular examination: Secondary | ICD-10-CM | POA: Diagnosis not present

## 2019-02-13 DIAGNOSIS — I428 Other cardiomyopathies: Secondary | ICD-10-CM | POA: Diagnosis present

## 2019-02-13 DIAGNOSIS — I270 Primary pulmonary hypertension: Secondary | ICD-10-CM | POA: Diagnosis not present

## 2019-02-13 DIAGNOSIS — Z8673 Personal history of transient ischemic attack (TIA), and cerebral infarction without residual deficits: Secondary | ICD-10-CM | POA: Diagnosis not present

## 2019-02-13 DIAGNOSIS — M898X9 Other specified disorders of bone, unspecified site: Secondary | ICD-10-CM | POA: Diagnosis present

## 2019-02-13 DIAGNOSIS — I272 Pulmonary hypertension, unspecified: Secondary | ICD-10-CM | POA: Diagnosis present

## 2019-02-13 DIAGNOSIS — E8770 Fluid overload, unspecified: Secondary | ICD-10-CM | POA: Diagnosis not present

## 2019-02-13 DIAGNOSIS — I2510 Atherosclerotic heart disease of native coronary artery without angina pectoris: Secondary | ICD-10-CM | POA: Diagnosis present

## 2019-02-13 DIAGNOSIS — Z515 Encounter for palliative care: Secondary | ICD-10-CM | POA: Diagnosis not present

## 2019-02-13 DIAGNOSIS — R600 Localized edema: Secondary | ICD-10-CM | POA: Diagnosis not present

## 2019-02-13 DIAGNOSIS — G4733 Obstructive sleep apnea (adult) (pediatric): Secondary | ICD-10-CM | POA: Diagnosis present

## 2019-02-13 DIAGNOSIS — D696 Thrombocytopenia, unspecified: Secondary | ICD-10-CM | POA: Diagnosis not present

## 2019-02-13 DIAGNOSIS — I129 Hypertensive chronic kidney disease with stage 1 through stage 4 chronic kidney disease, or unspecified chronic kidney disease: Secondary | ICD-10-CM | POA: Diagnosis not present

## 2019-02-13 DIAGNOSIS — E876 Hypokalemia: Secondary | ICD-10-CM | POA: Diagnosis not present

## 2019-02-13 DIAGNOSIS — Z9981 Dependence on supplemental oxygen: Secondary | ICD-10-CM

## 2019-02-13 DIAGNOSIS — N186 End stage renal disease: Secondary | ICD-10-CM

## 2019-02-13 DIAGNOSIS — M109 Gout, unspecified: Secondary | ICD-10-CM | POA: Diagnosis not present

## 2019-02-13 DIAGNOSIS — D631 Anemia in chronic kidney disease: Secondary | ICD-10-CM | POA: Diagnosis present

## 2019-02-13 DIAGNOSIS — N183 Chronic kidney disease, stage 3 unspecified: Secondary | ICD-10-CM | POA: Diagnosis not present

## 2019-02-13 DIAGNOSIS — Z8249 Family history of ischemic heart disease and other diseases of the circulatory system: Secondary | ICD-10-CM | POA: Diagnosis not present

## 2019-02-13 DIAGNOSIS — I5082 Biventricular heart failure: Secondary | ICD-10-CM

## 2019-02-13 DIAGNOSIS — Z87891 Personal history of nicotine dependence: Secondary | ICD-10-CM

## 2019-02-13 DIAGNOSIS — Z7901 Long term (current) use of anticoagulants: Secondary | ICD-10-CM

## 2019-02-13 DIAGNOSIS — I5042 Chronic combined systolic (congestive) and diastolic (congestive) heart failure: Secondary | ICD-10-CM

## 2019-02-13 DIAGNOSIS — I248 Other forms of acute ischemic heart disease: Secondary | ICD-10-CM | POA: Diagnosis not present

## 2019-02-13 DIAGNOSIS — N179 Acute kidney failure, unspecified: Secondary | ICD-10-CM

## 2019-02-13 DIAGNOSIS — Z20828 Contact with and (suspected) exposure to other viral communicable diseases: Secondary | ICD-10-CM | POA: Diagnosis not present

## 2019-02-13 DIAGNOSIS — I110 Hypertensive heart disease with heart failure: Secondary | ICD-10-CM | POA: Diagnosis not present

## 2019-02-13 DIAGNOSIS — M170 Bilateral primary osteoarthritis of knee: Secondary | ICD-10-CM | POA: Diagnosis present

## 2019-02-13 DIAGNOSIS — N184 Chronic kidney disease, stage 4 (severe): Secondary | ICD-10-CM

## 2019-02-13 DIAGNOSIS — J9611 Chronic respiratory failure with hypoxia: Secondary | ICD-10-CM | POA: Diagnosis not present

## 2019-02-13 DIAGNOSIS — N189 Chronic kidney disease, unspecified: Secondary | ICD-10-CM | POA: Diagnosis not present

## 2019-02-13 LAB — CBC
HCT: 42 % (ref 39.0–52.0)
Hemoglobin: 14.1 g/dL (ref 13.0–17.0)
MCH: 32.9 pg (ref 26.0–34.0)
MCHC: 33.6 g/dL (ref 30.0–36.0)
MCV: 97.9 fL (ref 80.0–100.0)
Platelets: 187 10*3/uL (ref 150–400)
RBC: 4.29 MIL/uL (ref 4.22–5.81)
RDW: 16.8 % — ABNORMAL HIGH (ref 11.5–15.5)
WBC: 8.9 10*3/uL (ref 4.0–10.5)
nRBC: 0 % (ref 0.0–0.2)

## 2019-02-13 LAB — HEPATIC FUNCTION PANEL
ALT: 133 U/L — ABNORMAL HIGH (ref 0–44)
AST: 225 U/L — ABNORMAL HIGH (ref 15–41)
Albumin: 3 g/dL — ABNORMAL LOW (ref 3.5–5.0)
Alkaline Phosphatase: 217 U/L — ABNORMAL HIGH (ref 38–126)
Bilirubin, Direct: 0.9 mg/dL — ABNORMAL HIGH (ref 0.0–0.2)
Indirect Bilirubin: 1.9 mg/dL — ABNORMAL HIGH (ref 0.3–0.9)
Total Bilirubin: 2.8 mg/dL — ABNORMAL HIGH (ref 0.3–1.2)
Total Protein: 6.8 g/dL (ref 6.5–8.1)

## 2019-02-13 LAB — BASIC METABOLIC PANEL
Anion gap: 15 (ref 5–15)
BUN: 66 mg/dL — ABNORMAL HIGH (ref 8–23)
CO2: 28 mmol/L (ref 22–32)
Calcium: 9.3 mg/dL (ref 8.9–10.3)
Chloride: 97 mmol/L — ABNORMAL LOW (ref 98–111)
Creatinine, Ser: 2.76 mg/dL — ABNORMAL HIGH (ref 0.61–1.24)
GFR calc Af Amer: 27 mL/min — ABNORMAL LOW (ref >60)
GFR calc non Af Amer: 24 mL/min — ABNORMAL LOW (ref >60)
Glucose, Bld: 139 mg/dL — ABNORMAL HIGH (ref 70–99)
Potassium: 3.8 mmol/L (ref 3.5–5.1)
Sodium: 140 mmol/L (ref 135–145)

## 2019-02-13 LAB — GLUCOSE, CAPILLARY
Glucose-Capillary: 75 mg/dL (ref 70–99)
Glucose-Capillary: 119 mg/dL — ABNORMAL HIGH (ref 70–99)

## 2019-02-13 LAB — SARS CORONAVIRUS 2 (TAT 6-24 HRS): SARS Coronavirus 2: NEGATIVE

## 2019-02-13 LAB — TROPONIN I (HIGH SENSITIVITY)
Troponin I (High Sensitivity): 284 ng/L (ref <18)
Troponin I (High Sensitivity): 301 ng/L

## 2019-02-13 LAB — PROTIME-INR
INR: 2.4 — ABNORMAL HIGH (ref 0.8–1.2)
Prothrombin Time: 25.8 seconds — ABNORMAL HIGH (ref 11.4–15.2)

## 2019-02-13 LAB — BRAIN NATRIURETIC PEPTIDE: B Natriuretic Peptide: 2637.3 pg/mL — ABNORMAL HIGH (ref 0.0–100.0)

## 2019-02-13 MED ORDER — SODIUM CHLORIDE 0.9% FLUSH
3.0000 mL | Freq: Two times a day (BID) | INTRAVENOUS | Status: DC
  Administered 2019-02-15 – 2019-02-20 (×5): 3 mL via INTRAVENOUS

## 2019-02-13 MED ORDER — FUROSEMIDE 10 MG/ML IJ SOLN
100.0000 mg | Freq: Once | INTRAVENOUS | Status: DC
  Filled 2019-02-13: qty 10

## 2019-02-13 MED ORDER — SODIUM CHLORIDE 0.9% FLUSH: 3.0000 mL | INTRAVENOUS | Status: DC | PRN

## 2019-02-13 MED ORDER — WARFARIN SODIUM 5 MG PO TABS
5.0000 mg | ORAL_TABLET | Freq: Once | ORAL | Status: AC
  Administered 2019-02-13: 5 mg via ORAL
  Filled 2019-02-13: qty 1

## 2019-02-13 MED ORDER — WARFARIN - PHARMACIST DOSING INPATIENT: Freq: Every day | Status: DC

## 2019-02-13 MED ORDER — FUROSEMIDE 10 MG/ML IJ SOLN
60.0000 mg | Freq: Once | INTRAMUSCULAR | Status: AC
  Administered 2019-02-13: 13:00:00 60 mg via INTRAVENOUS
  Filled 2019-02-13: qty 6

## 2019-02-13 MED ORDER — AMIODARONE HCL 200 MG PO TABS
200.0000 mg | ORAL_TABLET | Freq: Every day | ORAL | Status: DC
  Administered 2019-02-14: 200 mg via ORAL
  Filled 2019-02-13 (×2): qty 1

## 2019-02-13 MED ORDER — FUROSEMIDE 10 MG/ML IJ SOLN
100.0000 mg | Freq: Once | INTRAVENOUS | Status: AC
  Administered 2019-02-13: 100 mg via INTRAVENOUS
  Filled 2019-02-13: qty 10

## 2019-02-13 MED ORDER — AMIODARONE HCL 200 MG PO TABS
200.0000 mg | ORAL_TABLET | Freq: Once | ORAL | Status: AC
  Administered 2019-02-13: 200 mg via ORAL
  Filled 2019-02-13: qty 1

## 2019-02-13 MED ORDER — ACETAMINOPHEN 325 MG PO TABS
650.0000 mg | ORAL_TABLET | ORAL | Status: DC | PRN
  Administered 2019-02-18 – 2019-03-03 (×3): 650 mg via ORAL
  Filled 2019-02-13 (×3): qty 2

## 2019-02-13 MED ORDER — POTASSIUM CHLORIDE CRYS ER 20 MEQ PO TBCR
40.0000 meq | EXTENDED_RELEASE_TABLET | Freq: Once | ORAL | Status: DC
  Filled 2019-02-13: qty 2

## 2019-02-13 MED ORDER — SODIUM CHLORIDE 0.9 % IV SOLN: 250.0000 mL | INTRAVENOUS | Status: DC | PRN

## 2019-02-13 MED ORDER — POTASSIUM CHLORIDE CRYS ER 20 MEQ PO TBCR
40.0000 meq | EXTENDED_RELEASE_TABLET | Freq: Once | ORAL | Status: AC
  Administered 2019-02-13: 40 meq via ORAL
  Filled 2019-02-13: qty 2

## 2019-02-13 MED ORDER — ATORVASTATIN CALCIUM 80 MG PO TABS
80.0000 mg | ORAL_TABLET | Freq: Every day | ORAL | Status: DC
  Administered 2019-02-13 – 2019-03-02 (×18): 80 mg via ORAL
  Filled 2019-02-13 (×18): qty 1

## 2019-02-13 MED ORDER — SODIUM CHLORIDE 0.9% FLUSH
3.0000 mL | Freq: Once | INTRAVENOUS | Status: AC
  Administered 2019-02-13: 3 mL via INTRAVENOUS

## 2019-02-13 MED ORDER — FUROSEMIDE 10 MG/ML IJ SOLN
120.0000 mg | Freq: Two times a day (BID) | INTRAVENOUS | Status: DC
  Administered 2019-02-13 – 2019-02-14 (×3): 120 mg via INTRAVENOUS
  Filled 2019-02-13: qty 10
  Filled 2019-02-13: qty 12
  Filled 2019-02-13: qty 10
  Filled 2019-02-13 (×2): qty 12

## 2019-02-13 NOTE — Progress Notes (Signed)
ANTICOAGULATION CONSULT NOTE - Initial Consult  Pharmacy Consult for Warfarin Indication: atrial fibrillation  No Known Allergies  Patient Measurements: Height: 5\' 10"  (177.8 cm) Weight: 276 lb 11.2 oz (125.5 kg) IBW/kg (Calculated) : 73  Vital Signs: Temp: 97.9 F (36.6 C) (10/12 1543) Temp Source: Oral (10/12 1543) BP: 114/87 (10/12 1543) Pulse Rate: 73 (10/12 1543)  Labs: Recent Labs    02/13/19 1109 02/13/19 1556  HGB 14.1  --   HCT 42.0  --   PLT 187  --   LABPROT  --  25.8*  INR  --  2.4*  CREATININE 2.76*  --   TROPONINIHS 284* 301*    Estimated Creatinine Clearance: 36.9 mL/min (A) (by C-G formula based on SCr of 2.76 mg/dL (H)).   Medical History: Past Medical History:  Diagnosis Date  . CAD (coronary artery disease)    a. Initial nonobst by cath 2009. b. Cath 01/2013 in setting of VT storm: obstructive distal Cx disease (small and terminates in the AV groove, unlikely to cause significant ischemia or electrical instability), nonobstructive RCA disease, EF 15-20%.   . Cerebrovascular accident [facility])    a. 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 Chronic systolic CHF (congestive heart failure) ([place])    a. Likely NICM (out of proportion to CAD). b. 2009 - EF 25-30% by echo, 01/2013: 15-20% by cath. c. 11/2014 Echo: EF 35-40%, Gr1 DD, mild MR, mod TR, PASP 58mmHg.  . CKD (chronic kidney disease), stage II   . Dyslipidemia   . Gout   . HTN (hypertension)   . Hypokalemia   . ICD (implantable cardiac defibrillator) in place   . Insulin dependent diabetes mellitus   . Lipoma   . Nonischemic cardiomyopathy ([place])    a.  11/2014 Echo: EF 35-40%, Gr1 DD, mild MR, mod TR, PASP 39mmHg.  . OSA (obstructive sleep apnea)    does not wear cpap  . PAF (paroxysmal atrial fibrillation) ([place])    a. Noted 05/2008 by EKG;  b. CHA2DS2VASc = 5-6-->coumadin.  . Paroxysmal VT ([place])    a. s/p St. Jude ICD 2007. b. H/o paroxysmal VT/VF including VT storm 12/2012 admission prompting amio  initiation;  c. 01/2013 ICD upgrade SJM 1411-36Q Ellipse VR single lead ICD.  [name] Pulmonary HTN ([place])    a. Mild by cath 01/2013.    Medications:  Scheduled:  . amiodarone  200 mg Oral Daily  . atorvastatin  80 mg Oral q1800  . sodium chloride flush  3 mL Intravenous Q12H   Infusions:  . sodium chloride    . furosemide      Assessment: Pt is a 63 y/o male with PMH of CHF (EF 20-25%), prior VT s/p St Jude ICD, CAD, CVA, OSA on CPAP, pulmonary HTN, HTN, DM, CKDs stage III, and PAF on coumadin PTA. Pt oresents to the ED with volume overload. He will be admitted for IV diuresis. Pharmacy has been consulted to dose warfarin  Patient's INR today is 2.4. His new outpatient warfarin regimen is 5mg  daily, except 2.5 mg on fridays.   CBC and plts are wnls.   Goal of Therapy:  INR 2-3 Monitor platelets by anticoagulation protocol: Yes   Plan:  Give warfarin 5 mg x 1 tonight Monitor for signs/symptoms of bleeding Check daily INR Monitor CBC  Sherren Kerns, PharmD PGY1 Acute Care Pharmacy Resident 02/13/2019,5:12 PM

## 2019-02-13 NOTE — Telephone Encounter (Signed)
Pt called this morning c/o swelling in both legs and feet. Pt also notices swelling in abdomen. Pt denies shortness of breath, chest pain, fatigue, dizziness.  Pt called on 10/7 and spoke with Chantel c/o swelling and his weight was 264.5lbs. Amy advised pt to take torsemide 40mg  bid x 2 days and then back to 40mg  daily and have labs this week. Pts weight today is 270lbs 8oz and states his urine is dark and he has low urine output. I told pt I would follow up with Dr.Bensimhon and give him a call back.   Routed to [place] for advice

## 2019-02-13 NOTE — ED Triage Notes (Signed)
Pt reports bilateral lower leg swelling for the last week. Noted 10lb weight gain over the last week. Denies CP, reports SOB with exertion. Denies recent cough or fever. Wear O2 at 2L at baseline

## 2019-02-13 NOTE — H&P (Addendum)
Advanced Heart Failure Team History and Physical Note   PCP:  Biagio Borg, MD  PCP-Cardiology: Minus Breeding, MD     Reason for Admission: acute on chronic biventricular heart failure    HPI:    [name] is a 63 y.o. male with history oflongstandingcardiomyopathy out of proportion to CAD/chronic biventricular CHF(EF 20-25% 07/2017), prior VT,s/p St Jude ICD, CAD,CVA,OSA on CPAP, pulmonary HTN on O2 at home, PAFon coumadin,HTN, DM,and CKD stage 3.  Admitted from Dr Hochrein's office 10/01/17 with volume overload. HF team was consulted. Underwent R/LHC 10/04/17 - full report below. PYP scan and myeloma panel completed and were not suggestive of cardiac amyloidosis. V/Q scan completed for elevated PA pressures on cath, which was negative. He diuresed 31 lbs with IV lasix and metolazone and transitioned to torsemide 60 mg daily. HF meds were optimized. No ACE/ARB with CKD and soft BPs.Discharge weight 269 pounds.  Echo 11/17/18: EF 20-25% RV dilated and severely HK with septal flattening.  Cath 12/06/18 mild CAD  Ao = 115/80 (95) LV = 100/12 RA = 10 RV = 74/14 PA = 73/36 (48) PCW = 9 Fick cardiac output/index = 5.7/2.4 Thermo CO/CI = 4.0/1.7 PVR = 6.8 (Fick) PVR = 9.7 (Thermo) FA sat = 96% PA sat = 65%, 67%  Admitted 01/02/19 for atrial flutter and AKI. Had DC-CV on 01/03/19. Creatinine peaked at 3.4. At discharge was 3.2. Weight 251. He had clinic f/u on 01/12/19 and still with NYHA III symptoms. His volume status appeared stable but thoracic impedence was trending down but had not yet crossed threshold. He was continued on torsemide 40 mg daily w/ instruction to take an extra 20 mg if needed. It was also noted that he continued to show signs of deterioration w/ biventricular dysfunction/PAH and progressive CKD. Only real option seems to be consideration of heart kidney transplant. This was discussed and pt was open to consider but age and weight may be prohibitive (BMI  38). Plan was to discuss w/ physicians at [facility].    On 02/08/19, pt called office w/ report of increased wt gain and bilateral LEE. Wt increased 3 lb to 265. This occurred shortly after getting cortisone injections in his knees for OA. He was instructed to increase torsemide to 40 bid x 2 days and return to 40 mg daily, thereafter. He failed to respond to increase in diuretics. Weight gain and LEE progressed. Pt reports 10 lb gain total. Also with abdominal bloating/distention, prompting pt to come to ED for evaluation.   In the ED, BNP 2,637 (higher than previous baseline 1600>>2100). CXR shows cardiomegally and pulmonary venous congestion. Scr improved from previous hospitalization, down from 3.3>>2.76 today.  HFTs pending. Hs troponin 284. EKG shows SR w/ PVCs, RAD and non-specific intra-ventricular conduction block. HR 80. SBP  Soft in the 90s. CBC unremarkable. Normal WBC. No anemia. Afebrile. Covid test pending. He is markedly volume overloaded on exam w 3+ LEE. He has had exertional dyspnea and orthopnea and PND. Has been sleeping in recliner at home.   In addition, reports given from device clinic that he had 3 atp attempts followed by shock on 10/6.     Review of Systems: [y] = yes, [ ]  = no   General: Weight gain [ Y]; Weight loss [ ] ; Anorexia [ ] ; Fatigue [Y ]; Fever [ ] ; Chills [ ] ; Weakness [ ]   Cardiac: Chest pain/pressure [ ] ; Resting SOB [ ] ; Exertional SOB [Y ]; Orthopnea [ Y]; Pedal  Edema [ Y]; Palpitations [ ] ; Syncope [ ] ; Presyncope [ ] ; Paroxysmal nocturnal dyspnea[ ]   Pulmonary: Cough [ ] ; Wheezing[ ] ; Hemoptysis[ ] ; Sputum [ ] ; Snoring [ ]   GI: Vomiting[ ] ; Dysphagia[ ] ; Melena[ ] ; Hematochezia [ ] ; Heartburn[ ] ; Abdominal pain [ ] ; Constipation [ ] ; Diarrhea [ ] ; BRBPR [ ]   GU: Hematuria[ ] ; Dysuria [ ] ; Nocturia[ ]   Vascular: Pain in legs with walking [ ] ; Pain in feet with lying flat [ ] ; Non-healing sores [ ] ; Stroke [ ] ; TIA [ ] ; Slurred speech [ ] ;  Neuro: Headaches[ ] ;  Vertigo[ ] ; Seizures[ ] ; Paresthesias[ ] ;Blurred vision [ ] ; Diplopia [ ] ; Vision changes [ ]   Ortho/Skin: Arthritis [ ] ; Joint pain [ ] ; Muscle pain [ ] ; Joint swelling [ ] ; Back Pain [ ] ; Rash [ ]   Psych: Depression[ ] ; Anxiety[ ]   Heme: Bleeding problems [ ] ; Clotting disorders [ ] ; Anemia [ ]   Endocrine: Diabetes [ ] ; Thyroid dysfunction[ ]    Home Medications Prior to Admission medications   Medication Sig Start Date End Date Taking? Authorizing Provider  acetaminophen (TYLENOL) 500 MG tablet Take 500-1,000 mg by mouth every 6 (six) hours as needed (for bilateral knee pain).    [provider]  amiodarone (PACERONE) 200 MG tablet Take 1 tablet (200 mg total) by mouth 2 (two) times daily. 01/05/19   Swayze, Ava, DO  atorvastatin (LIPITOR) 80 MG tablet Take 80 mg by mouth daily at 6 PM.    [provider]  carvedilol (COREG) 3.125 MG tablet Take 1 tablet (3.125 mg total) by mouth 2 (two) times daily with a meal. 02/03/19   Biagio Borg, MD  Insulin Glargine (LANTUS SOLOSTAR) 100 UNIT/ML Solostar Pen Inject 10 units into the skin at bedtime    [provider]  Insulin Pen Needle (B-D ULTRAFINE III SHORT PEN) 31G X 8 MM MISC USE ONCE DAILY WITH INSULIN 01/14/18   Biagio Borg, MD  Insulin Pen Needle (PEN NEEDLES) 31G X 5 MM MISC 100 each by Does not apply route 2 (two) times daily. 11/12/17   Lance Sell, NP  isosorbide-hydrALAZINE (BIDIL) 20-37.5 MG tablet Take 0.5 tablets by mouth 3 (three) times daily. 01/12/19   An Schnabel, Shaune Pascal, MD  nitroGLYCERIN (NITROSTAT) 0.4 MG SL tablet Place 0.4 mg under the tongue every 5 (five) minutes as needed for chest pain.    [provider]  OXYGEN Inhale into the lungs. 2L continuous; 3L on pulse machine    [provider]  torsemide (DEMADEX) 20 MG tablet Take 2 tablets (40 mg total) by mouth daily. 01/06/19   Swayze, Ava, DO  warfarin (COUMADIN) 5 MG tablet Take 7.5 mg by mouth on Monday, Wednesday and  Friday and take 5 mg on Tuesday, Thursday, Saturday and Sunday in the evening    [provider]    Past Medical History: Past Medical History:  Diagnosis Date   CAD (coronary artery disease)    a. Initial nonobst by cath 2009. b. Cath 01/2013 in setting of VT storm: obstructive distal Cx disease (small and terminates in the AV groove, unlikely to cause significant ischemia or electrical instability), nonobstructive RCA disease, EF 15-20%.    Cerebrovascular accident [facility])    a. Basilar CVA 2000. denies deficits   Chronic systolic CHF (congestive heart failure) ([place])    a. Likely NICM (out of proportion to CAD). b. 2009 - EF 25-30% by echo, 01/2013: 15-20% by cath. c. 11/2014 Echo: EF  35-40%, Gr1 DD, mild MR, mod TR, PASP 16mmHg.   CKD (chronic kidney disease), stage II    Dyslipidemia    Gout    HTN (hypertension)    Hypokalemia    ICD (implantable cardiac defibrillator) in place    Insulin dependent diabetes mellitus ([place])    Lipoma    Nonischemic cardiomyopathy ([place])    a.  11/2014 Echo: EF 35-40%, Gr1 DD, mild MR, mod TR, PASP 47mmHg.   OSA (obstructive sleep apnea)    does not wear cpap   PAF (paroxysmal atrial fibrillation) ([place])    a. Noted 05/2008 by EKG;  b. CHA2DS2VASc = 5-6-->coumadin.   Paroxysmal VT ([place])    a. s/p St. Jude ICD 2007. b. H/o paroxysmal VT/VF including VT storm 12/2012 admission prompting amio initiation;  c. 01/2013 ICD upgrade SJM 1411-36Q Ellipse VR single lead ICD.   Pulmonary HTN ([place])    a. Mild by cath 01/2013.    Past Surgical History: Past Surgical History:  Procedure Laterality Date   CARDIAC CATHETERIZATION     Nonobstructive coronary disease 2009   CARDIAC DEFIBRILLATOR PLACEMENT     ICD-St. Jude   CARDIOVERSION N/A 01/03/2019   Procedure: CARDIOVERSION;  Surgeon: Jolaine Artist, MD;  Location: [facility];  Service: Cardiovascular;  Laterality: N/A;   IMPLANTABLE CARDIOVERTER DEFIBRILLATOR (ICD) GENERATOR  CHANGE N/A 01/15/2014   Procedure: ICD GENERATOR CHANGE;  Surgeon: Evans Lance, MD;  Location[facility] CATH LAB;  Service: Cardiovascular;  Laterality: N/A;   LEFT AND RIGHT HEART CATHETERIZATION WITH CORONARY ANGIOGRAM N/A 01/03/2013   Procedure: LEFT AND RIGHT HEART CATHETERIZATION WITH CORONARY ANGIOGRAM;  Surgeon: Peter M Martinique, MD;  [facility] CATH LAB;  Service: Cardiovascular;  Laterality: N/A;   LIPOMA EXCISION     RIGHT/LEFT HEART CATH AND CORONARY ANGIOGRAPHY N/A 10/04/2017   Procedure: RIGHT/LEFT HEART CATH AND CORONARY ANGIOGRAPHY;  Surgeon: Larey Dresser, MD;  Location: [place] CV LAB;  Service: Cardiovascular;  Laterality: N/A;   RIGHT/LEFT HEART CATH AND CORONARY ANGIOGRAPHY N/A 12/06/2018   Procedure: RIGHT/LEFT HEART CATH AND CORONARY ANGIOGRAPHY;  Surgeon: Jolaine Artist, MD;  Location: [place] CV LAB;  Service: Cardiovascular;  Laterality: N/A;    Family History:  Family History  Problem Relation Age of Onset   Coronary artery disease Mother    Heart attack Mother    Heart attack Maternal Uncle    Heart attack Maternal Grandmother     Social History: Social History   Socioeconomic History   Marital status: Married    Spouse name: Not on file   Number of children: 2   Years of education: Not on file   Highest education level: Not on file  Occupational History   Occupation: medically retired due to heart failure  Social Needs   Emergency planning/management officer strain: Not on file   Food insecurity    Worry: Not on file    Inability: Not on file   Transportation needs    Medical: Yes    Non-medical: No  Tobacco Use   Smoking status: Former Smoker    Quit date: 05/04/1986    Years since quitting: 32.8   Smokeless tobacco: Never Used   Tobacco comment: Quit smoking 30 yrs ago. Smoked as teenager less than 1/2 ppd. Smoked x 4 years.  Substance and Sexual Activity   Alcohol use: No    Comment: occasionally   Drug use: No   Sexual activity:  Never  Lifestyle   Physical activity  Days per week: 0 days    Minutes per session: 0 min   Stress: Not at all  Relationships   Social connections    Talks on phone: Not on file    Gets together: Not on file    Attends religious service: Not on file    Active member of club or organization: Not on file    Attends meetings of clubs or organizations: Not on file    Relationship status: Not on file  Other Topics Concern   Not on file  Social History Narrative   Not on file    Allergies:  No Known Allergies  Objective:    Vital Signs:   Temp:  [98 F (36.7 C)] 98 F (36.7 C) (10/12 1104) Pulse Rate:  [77] 77 (10/12 1104) Resp:  [16] 16 (10/12 1104) BP: (93)/(55) 93/55 (10/12 1104) SpO2:  [100 %] 100 % (10/12 1104) Weight:  [122.5 kg] 122.5 kg (10/12 1105)   Filed Weights   02/13/19 1105  Weight: 122.5 kg     Physical Exam     General:  Obese, markedly overloaded AAM, appears fatigued. No respiratory difficulty HEENT: Normal Neck: Supple. elevated JVD to level of ear. Carotids 2+ bilat; no bruits. No lymphadenopathy or thyromegaly appreciated. Cor: PMI nondisplaced. Regular rate & rhythm. No rubs or gallops. + TR murmur at LLSB and MR murmur at apex Lungs: left sided basilar rales Abdomen: edematous abdomen. No hepatosplenomegaly. No bruits or masses. Good bowel sounds. Extremities: No cyanosis, clubbing, rash, 3+ bilateral LEE up to level of thighs Neuro: Alert & oriented x 3, cranial nerves grossly intact. moves all 4 extremities w/o difficulty. Affect pleasant.   Telemetry   SR w/ PVCs   EKG   SR w/ PVCs, RAD and non-specific intra-ventricular conduction block. HR 80    Labs     Basic Metabolic Panel: Recent Labs  Lab 02/13/19 1109  NA 140  K 3.8  CL 97*  CO2 28  GLUCOSE 139*  BUN 66*  CREATININE 2.76*  CALCIUM 9.3    Liver Function Tests: No results for input(s): AST, ALT, ALKPHOS, BILITOT, PROT, ALBUMIN in the last 168 hours. No  results for input(s): LIPASE, AMYLASE in the last 168 hours. No results for input(s): AMMONIA in the last 168 hours.  CBC: Recent Labs  Lab 02/13/19 1109  WBC 8.9  HGB 14.1  HCT 42.0  MCV 97.9  PLT 187    Cardiac Enzymes: No results for input(s): CKTOTAL, CKMB, CKMBINDEX, TROPONINI in the last 168 hours.  BNP: BNP (last 3 results) Recent Labs    07/19/18 1106 01/02/19 1552 02/13/19 1114  BNP 1,608.5* 2,154.7* 2,637.3*    ProBNP (last 3 results) Recent Labs    11/01/18 1229  PROBNP 2,158.0*     CBG: No results for input(s): GLUCAP in the last 168 hours.  Coagulation Studies: No results for input(s): LABPROT, INR in the last 72 hours.  Imaging: Dg Chest 2 View  Result Date: 02/13/2019 CLINICAL DATA:  Shortness of breath EXAM: CHEST - 2 VIEW COMPARISON:  January 02, 2019 FINDINGS: Cardiomegaly and pulmonary venous congestion are identified. A single lead AICD device is stable. No pneumothorax. No focal infiltrate. No other acute abnormalities. IMPRESSION: Cardiomegaly and pulmonary venous congestion. Electronically Signed   By: Dorise Bullion III M.D   On: 02/13/2019 11:49      Patient Profile   [name] is a 63 y.o. male with history oflongstandingcardiomyopathy out of proportion to CAD/chronic biventricular  CHF(EF 20-25% 07/2017), prior VT,s/p St Jude ICD, CAD,CVA,OSA on CPAP, pulmonary HTN on O2 at home, PAFon coumadin,HTN, DMand CKD stage 3, being admitted for acute on chronic biventricular heart failure.    Assessment/Plan   1. Chronic Systolic HF with biventricular dysfunction due to NICM - mild nonobstructive CAD on [facility] 12/2018. CM out of proportion of CAD. - PYP scan and myeloma panel completed and were not suggestive of cardiac amyloidosis - s/p St Jude ICD - given device clinic report of 3 atp attempts w/ shock, will interrogate device - ECHO 07/28/2017 EF 20-25%. RV mildly dilated.  - Echo 11/17/18 EF 20-25% RV dilated and severely HK  with septal flattening.  - NYHA IV symptoms. - Markedly volume overloaded w/ 3+ bilateral LEE, left basilar rales and elevated JVD to level of ear. Wt up 10+ lb from home baseline. BNP 2600 on admit.  - has CKD but SCr down from previous admit, down from 3.3>>2.76. K 3.8.  - he has received 60 mg of IV lasix in the ED w/ little UOP. Will give another 100 mg dose of IV now and supplement K.  - Given another 120 mg dose of IV lasix tonight. Place on standing dose 120 mg bid  -Strict I/Os and daily wts and close monitoring of renal function, K and Mg -If poor response to IV diuretics, will need CVC placement for Co-ox measurement and trial of inotropes - Given soft BP/ concern for low output, will hold  blocker (coreg) - Off digoxin due to AKI  - Hold bidil for now given soft BP - Off spiro and ACE/ARB due to CKD/AKI - He continues to deteriorate with biventricular dysfunction/PAH and progressive CKD. Only real option seems to be consideration of heart kidney transplant. This was previously discussed with him and he would consider but age and weight may be prohibitive. (BMI 38). Needs palliative care consult to discus GOC.   2. CKD stage 3 - Baseline creatinine ~1.9. Recent Cr 2.8 -> 2.4-> 2.7 -> 3.4 -> 2.97->2.76 today - Follows with Renal  -not a HD candidate -Will monitor closely while diuresing. Will order daily BMPs  3. CAD - LHC 8/20 with LAD 20%  50% distal left circ and 20% mid RCA stenosis. No significant disease in left main or LAD. - No s/s ischemia. Hs Trop 284 but likely demand ischemia in the setting of a/c CHF and abnormal renal function.   - Continue statin. - Hold  blocker due to soft BP/ concern for low output - No ASA due to chronic coumadin    4. PAF  -s/p recent DC-CV for AFL. In NSR today. - Continue amio 200 daily  - On coumadin, followed by coumadin clinic on [place].  - Check INR. Pharmacy to assist w/ dosing   6. Hx of VT - Has St Jude ICD. Device clinic  reports recent 3 atp attempts followed by shock on 10/6.  - Will ask device reps to interrogate -Keep K >4.0 and Mg > 2.0  -Monitor on tele.   7. Pulmonary HTN - On home O2 (2L).   - RHC as above -suspect primarily WHO Group 2 from Left Heart Disease. Also probable component of WHO Group 3 (OSA). Had negative V/Q scan ruling out CTEPH.    8. OSA with CPAP - off CPAP per Dr. Halford Chessman  9. DM:  -SSI    Lyda Jester, PA-C 02/13/2019, 1:13 PM  Advanced Heart Failure Team Pager [phone] (M-F; 7a - 4p)  Please  contact [facility] for night-coverage after hours (4p -7a ) and weekends on [email or web address]  Patient seen and examined with the above-signed Advanced Practice Provider and/or Housestaff. I personally reviewed laboratory data, imaging studies and relevant notes. I independently examined the patient and formulated the important aspects of the plan. I have edited the note to reflect any of my changes or salient points. I have personally discussed the plan with the patient and/or family.  63 y/o male with severe systolic HF with biventricular dysfunction due to NICM and advanced CKD. Recently hospitalized with low output HF. Now returns with progressive NYHA IV symptoms with marked fluid overload not responding to titration of oral diuretic regimen.   On exam JVP to ear Cor RRR + s3 2/6 MR and TR Lungs crackles in left base Ab obese Ext 3+ edema.    Will admit for IV diuresis. If no response may need inotrope support. With CKD 4, biventricular HF and morbid obesity advanced options are quite limited.   Glori Bickers, MD  3:21 PM

## 2019-02-13 NOTE — ED Provider Notes (Signed)
[facility] 6E PROGRESSIVE CARE Provider Note   CSN: [id number] Arrival date & time: 02/13/19  1046     History   Chief Complaint Chief Complaint  Patient presents with  . Leg Swelling    HPI [name] is a 63 y.o. male.     [name] is a 63 y.o. male with history of CAD, CHF, paroxysmal VT with ICD in place, stroke, hypertension, hyperlipidemia, gout, diabetes, who presents to the emergency department for evaluation of worsening bilateral lower extremity swelling over the past week.  Patient reports 10 pound weight gain despite increasing his diuretics.  Patient reports when waking started he contacted heart failure clinic and was told to increase his torsemide to 40 twice daily for 5 days, despite doing this he reports symptoms have continued to worsen.  He has swelling up to his mid thighs he reports legs feel tight but otherwise denies any pain.  No chest pain.  Reports dyspnea on exertion, no shortness of breath at rest.  He wears 2 L nasal cannula chronically and this is unchanged.  Shortness of breath is worse when laying flat.  He denies any associated cough or fever.  No abdominal pain, nausea vomiting or diarrhea.  Reports he is followed by Dr. Haroldine Laws for his heart failure.     Past Medical History:  Diagnosis Date  . CAD (coronary artery disease)    a. Initial nonobst by cath 2009. b. Cath 01/2013 in setting of VT storm: obstructive distal Cx disease (small and terminates in the AV groove, unlikely to cause significant ischemia or electrical instability), nonobstructive RCA disease, EF 15-20%.   . Cerebrovascular accident [facility])    a. 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 Chronic systolic CHF (congestive heart failure) ([place])    a. Likely NICM (out of proportion to CAD). b. 2009 - EF 25-30% by echo, 01/2013: 15-20% by cath. c. 11/2014 Echo: EF 35-40%, Gr1 DD, mild MR, mod TR, PASP 25mmHg.  . CKD (chronic kidney disease), stage II   . Dyslipidemia   . Gout   .  HTN (hypertension)   . Hypokalemia   . ICD (implantable cardiac defibrillator) in place   . Insulin dependent diabetes mellitus   . Lipoma   . Nonischemic cardiomyopathy ([place])    a.  11/2014 Echo: EF 35-40%, Gr1 DD, mild MR, mod TR, PASP 67mmHg.  . OSA (obstructive sleep apnea)    does not wear cpap  . PAF (paroxysmal atrial fibrillation) ([place])    a. Noted 05/2008 by EKG;  b. CHA2DS2VASc = 5-6-->coumadin.  . Paroxysmal VT ([place])    a. s/p St. Jude ICD 2007. b. H/o paroxysmal VT/VF including VT storm 12/2012 admission prompting amio initiation;  c. 01/2013 ICD upgrade SJM 1411-36Q Ellipse VR single lead ICD.  [name] Pulmonary HTN ([place])    a. Mild by cath 01/2013.    Patient Active Problem List   Diagnosis Date Noted  . Acute on chronic combined systolic and diastolic HF (heart failure), NYHA class 3 ([place]) 02/13/2019  . Systolic congestive heart failure ([place]) 01/02/2019  . Acute kidney injury superimposed on chronic kidney disease ([place]) 01/02/2019  . Chronic respiratory failure ([place]) 07/21/2018  . Degenerative arthritis of knee, bilateral 04/19/202019  . Essential hypertension 10/01/2017  . Wellness examination 08/07/2017  . Obesity (BMI 35.0-39.9 without comorbidity) 08/03/2017  . Dyslipidemia 08/03/2017  . CKD (chronic kidney disease), stage III 08/03/2017  . Ischemic chest pain ([place]) 07/27/2017  . Hypoxia 09/22/2016  .  Tear of MCL (medial collateral ligament) of knee, left, initial encounter 08/21/2016  . Degenerative arthritis of left knee 08/21/2016  . Morbid obesity ([place]) 12/05/2014  . Type 2 diabetes mellitus ([place]) 11/21/2014  . OSA (obstructive sleep apnea) 11/21/2014  . Encounter for therapeutic drug monitoring 06/23/2013  . PAF (paroxysmal atrial fibrillation) ([place]) 12/30/2012  . Nonischemic cardiomyopathy ([place])   . Ventricular tachycardia ([place]) 11/16/2012  . Hearing loss, left 03/18/2012  . Dizziness 12/22/2011  . Obesity 01/16/2011  . Long term (current) use of anticoagulants  08/05/2010  . Automatic implantable cardioverter-defibrillator in situ 08/15/2009  . FATIGUE 06/17/2009  . Coronary atherosclerosis 10/11/2008  . Paroxysmal ventricular tachycardia ([place]) 08/28/2008  . Atrial fibrillation ([place]) 08/28/2008  . History of cardiovascular disorder 12/06/2007  . Hypertensive heart disease 05/09/2007  . LIPOMA 05/06/2007  . Hyperlipidemia 05/06/2007  . Gout 05/06/2007  . Chronic combined systolic and diastolic CHF (congestive heart failure) ([place]) 05/06/2007  . ACUTE BUT ILL-DEFINED CEREBROVASCULAR DISEASE 05/06/2007  . DENTAL CARIES 05/06/2007    Past Surgical History:  Procedure Laterality Date  . CARDIAC CATHETERIZATION     Nonobstructive coronary disease 2009  . CARDIAC DEFIBRILLATOR PLACEMENT     ICD-St. Jude  . CARDIOVERSION N/A 01/03/2019   Procedure: CARDIOVERSION;  Surgeon: Jolaine Artist, MD;  Location: [facility] ENDOSCOPY;  Service: Cardiovascular;  Laterality: N/A;  . IMPLANTABLE CARDIOVERTER DEFIBRILLATOR (ICD) GENERATOR CHANGE N/A 01/15/2014   Procedure: ICD GENERATOR CHANGE;  Surgeon: Evans Lance, MD;  Location: [facility] CATH LAB;  Service: Cardiovascular;  Laterality: N/A;  . LEFT AND RIGHT HEART CATHETERIZATION WITH CORONARY ANGIOGRAM N/A 01/03/2013   Procedure: LEFT AND RIGHT HEART CATHETERIZATION WITH CORONARY ANGIOGRAM;  Surgeon: Peter M Martinique, MD;  Location: [facility] CATH LAB;  Service: Cardiovascular;  Laterality: N/A;  . LIPOMA EXCISION    . RIGHT/LEFT HEART CATH AND CORONARY ANGIOGRAPHY N/A 10/04/2017   Procedure: RIGHT/LEFT HEART CATH AND CORONARY ANGIOGRAPHY;  Surgeon: Larey Dresser, MD;  Location: [facility] CV LAB;  Service: Cardiovascular;  Laterality: N/A;  . RIGHT/LEFT HEART CATH AND CORONARY ANGIOGRAPHY N/A 12/06/2018   Procedure: RIGHT/LEFT HEART CATH AND CORONARY ANGIOGRAPHY;  Surgeon: Jolaine Artist, MD;  Location: [place] CV LAB;  Service: Cardiovascular;  Laterality: N/A;        Home Medications    Prior to Admission  medications   Medication Sig Start Date End Date Taking? Authorizing Provider  amiodarone (PACERONE) 200 MG tablet Take 1 tablet (200 mg total) by mouth 2 (two) times daily. Patient taking differently: Take 200 mg by mouth daily.  01/05/19  Yes Swayze, Ava, DO  atorvastatin (LIPITOR) 80 MG tablet Take 80 mg by mouth daily at 6 PM.   Yes [provider]  carvedilol (COREG) 3.125 MG tablet Take 1 tablet (3.125 mg total) by mouth 2 (two) times daily with a meal. 02/03/19  Yes Biagio Borg, MD  Insulin Glargine (LANTUS SOLOSTAR) 100 UNIT/ML Solostar Pen Inject 10 units into the skin at bedtime   Yes [provider]  Insulin Pen Needle (B-D ULTRAFINE III SHORT PEN) 31G X 8 MM MISC USE ONCE DAILY WITH INSULIN 01/14/18  Yes Biagio Borg, MD  Insulin Pen Needle (PEN NEEDLES) 31G X 5 MM MISC 100 each by Does not apply route 2 (two) times daily. 11/12/17  Yes Shambley, Delphia Grates, NP  isosorbide-hydrALAZINE (BIDIL) 20-37.5 MG tablet Take 0.5 tablets by mouth 3 (three) times daily. 01/12/19  Yes Bensimhon, Shaune Pascal, MD  nitroGLYCERIN (NITROSTAT) 0.4  MG SL tablet Place 0.4 mg under the tongue every 5 (five) minutes as needed for chest pain.   Yes [provider]  OXYGEN Inhale into the lungs. 2L continuous; 3L on pulse machine   Yes [provider]  torsemide (DEMADEX) 20 MG tablet Take 2 tablets (40 mg total) by mouth daily. 01/06/19  Yes Swayze, Ava, DO  Turmeric 500 MG CAPS Take 1,000 mg by mouth daily.   Yes [provider]  warfarin (COUMADIN) 5 MG tablet Take 2.5-5 mg by mouth See admin instructions. 5mg  everyday except Friday; 2.5mg  on fridays   Yes [provider]    Family History Family History  Problem Relation Age of Onset  . Coronary artery disease Mother   . Heart attack Mother   . Heart attack Maternal Uncle   . Heart attack Maternal Grandmother     Social History Social History   Tobacco Use  . Smoking status: Former Smoker    Quit  date: 05/04/1986    Years since quitting: 32.8  . Smokeless tobacco: Never Used  . Tobacco comment: Quit smoking 30 yrs ago. Smoked as teenager less than 1/2 ppd. Smoked x 4 years.  Substance Use Topics  . Alcohol use: No    Comment: occasionally  . Drug use: No     Allergies   Patient has no known allergies.   Review of Systems Review of Systems  Constitutional: Negative for chills and fever.  HENT: Negative.   Respiratory: Positive for shortness of breath. Negative for cough.   Cardiovascular: Positive for leg swelling. Negative for chest pain and palpitations.  Gastrointestinal: Negative for abdominal pain, nausea and vomiting.  Musculoskeletal: Negative for arthralgias and myalgias.  Skin: Negative for color change and rash.  Neurological: Negative for dizziness, syncope and light-headedness.  All other systems reviewed and are negative.    Physical Exam Updated Vital Signs BP 114/87 (BP Location: Left Arm)   Pulse 73   Temp 97.9 F (36.6 C) (Oral)   Resp 20   Ht 5\' 10"  (1.778 m)   Wt 125.5 kg   SpO2 99%   BMI 39.70 kg/m   Physical Exam Vitals signs and nursing note reviewed.  Constitutional:      General: He is not in acute distress.    Appearance: Normal appearance. He is well-developed and normal weight. He is ill-appearing. He is not diaphoretic.     Comments: Patient somewhat ill-appearing but in no acute distress  HENT:     Head: Normocephalic and atraumatic.     Mouth/Throat:     Mouth: Mucous membranes are moist.     Pharynx: Oropharynx is clear.  Eyes:     General:        Right eye: No discharge.        Left eye: No discharge.     Pupils: Pupils are equal, round, and reactive to light.  Neck:     Musculoskeletal: Neck supple.     Vascular: JVD present.  Cardiovascular:     Rate and Rhythm: Normal rate and regular rhythm.     Heart sounds: Normal heart sounds.     Comments: Respirations equal and unlabored, patient satting at 100% on chronic 2  L nasal cannula, able to speak in full sentences.  Patient does have some crackles in bilateral lung bases with some decreased air movement but otherwise clear to auscultation. Pulmonary:     Effort: Pulmonary effort is normal. No respiratory distress.  Breath sounds: Normal breath sounds. No wheezing or rales.  Abdominal:     General: Bowel sounds are normal. There is no distension.     Palpations: Abdomen is soft. There is no mass.     Tenderness: There is no abdominal tenderness. There is no guarding.     Comments: Abdomen soft, nondistended, nontender to palpation in all quadrants without guarding or peritoneal signs  Musculoskeletal:        General: No deformity.     Right lower leg: Edema present.     Left lower leg: Edema present.     Comments: Bilateral lower extremities with significant pitting edema up to the mid thigh Distal pulses are intact, no wounds or drainage.  Skin:    General: Skin is warm and dry.     Capillary Refill: Capillary refill takes less than 2 seconds.  Neurological:     Mental Status: He is alert.     Coordination: Coordination normal.     Comments: Speech is clear, able to follow commands Moves extremities without ataxia, coordination intact  Psychiatric:        Mood and Affect: Mood normal.        Behavior: Behavior normal.      ED Treatments / Results  Labs (all labs ordered are listed, but only abnormal results are displayed) Labs Reviewed  BASIC METABOLIC PANEL - Abnormal; Notable for the following components:      Result Value   Chloride 97 (*)    Glucose, Bld 139 (*)    BUN 66 (*)    Creatinine, Ser 2.76 (*)    GFR calc non Af Amer 24 (*)    GFR calc Af Amer 27 (*)    All other components within normal limits  CBC - Abnormal; Notable for the following components:   RDW 16.8 (*)    All other components within normal limits  BRAIN NATRIURETIC PEPTIDE - Abnormal; Notable for the following components:   B Natriuretic Peptide 2,637.3  (*)    All other components within normal limits  HEPATIC FUNCTION PANEL - Abnormal; Notable for the following components:   Albumin 3.0 (*)    AST 225 (*)    ALT 133 (*)    Alkaline Phosphatase 217 (*)    Total Bilirubin 2.8 (*)    Bilirubin, Direct 0.9 (*)    Indirect Bilirubin 1.9 (*)    All other components within normal limits  PROTIME-INR - Abnormal; Notable for the following components:   Prothrombin Time 25.8 (*)    INR 2.4 (*)    All other components within normal limits  TROPONIN I (HIGH SENSITIVITY) - Abnormal; Notable for the following components:   Troponin I (High Sensitivity) 284 (*)    All other components within normal limits  TROPONIN I (HIGH SENSITIVITY) - Abnormal; Notable for the following components:   Troponin I (High Sensitivity) 301 (*)    All other components within normal limits  SARS CORONAVIRUS 2 (TAT 6-24 HRS)  GLUCOSE, CAPILLARY  HIV ANTIBODY (ROUTINE TESTING W REFLEX)  HIV4GL SAVE TUBE  BASIC METABOLIC PANEL  PROTIME-INR    EKG EKG Interpretation  Date/Time:  Monday February 13 2019 11:15:23 EDT Ventricular Rate:  80 PR Interval:  238 QRS Duration: 146 QT Interval:  468 QTC Calculation: 539 R Axis:   -175 Text Interpretation:  Undetermined rhythm Right superior axis deviation Non-specific intra-ventricular conduction block Abnormal ECG similar to previous with PVCs Confirmed by Charlesetta Shanks [phone]) on 02/13/2019 2:21:59  PM   Radiology Dg Chest 2 View  Result Date: 02/13/2019 CLINICAL DATA:  Shortness of breath EXAM: CHEST - 2 VIEW COMPARISON:  January 02, 2019 FINDINGS: Cardiomegaly and pulmonary venous congestion are identified. A single lead AICD device is stable. No pneumothorax. No focal infiltrate. No other acute abnormalities. IMPRESSION: Cardiomegaly and pulmonary venous congestion. Electronically Signed   By: Dorise Bullion III M.D   On: 02/13/2019 11:49    Procedures Procedures (including critical care time)  Medications  Ordered in ED Medications  amiodarone (PACERONE) tablet 200 mg (has no administration in time range)  atorvastatin (LIPITOR) tablet 80 mg (has no administration in time range)  sodium chloride flush (NS) 0.9 % injection 3 mL (has no administration in time range)  sodium chloride flush (NS) 0.9 % injection 3 mL (has no administration in time range)  0.9 %  sodium chloride infusion (has no administration in time range)  acetaminophen (TYLENOL) tablet 650 mg (has no administration in time range)  furosemide (LASIX) 120 mg in dextrose 5 % 50 mL IVPB (has no administration in time range)  sodium chloride flush (NS) 0.9 % injection 3 mL (3 mLs Intravenous Given 02/13/19 1245)  furosemide (LASIX) injection 60 mg (60 mg Intravenous Given 02/13/19 1244)  amiodarone (PACERONE) tablet 200 mg (200 mg Oral Given 02/13/19 1557)  furosemide (LASIX) 100 mg in dextrose 5 % 50 mL IVPB (0 mg Intravenous Stopped 02/13/19 1656)  potassium chloride SA (KLOR-CON) CR tablet 40 mEq (40 mEq Oral Given 02/13/19 1557)     Initial Impression / Assessment and Plan / ED Course  I have reviewed the triage vital signs and the nursing notes.  Pertinent labs & imaging results that were available during my care of the patient were reviewed by me and considered in my medical decision making (see chart for details).  63 year old male with history of heart failure, presents to the ED for evaluation of worsening lower extremity edema, shortness of breath with exertion and 10 pound weight gain despite increasing torsemide to 40 mg twice daily.  Labs collected from triage which are significant for a BNP of 2637.3, troponin of 284, which I suspect is demand ischemia as patient has not been experiencing any chest pain.  He has no leukocytosis and normal hemoglobin.  History of CKD, creatinine today of 2.76 actually improved from previous.  No other significant electrolyte derangements.  Chest x-ray shows cardiomegaly with pulmonary  venous congestion, EKG similar to previous without concerning changes.  Patient will require admission for acute on chronic heart failure.  Patient given 60 of IV Lasix here in the ED.  On cardiac monitoring he has having some frequent PVCs, he did not take his amiodarone this morning, dose ordered for him here in the ED.  Will consult cardiology as in the past patient has been admitted to heart failure service with exacerbations.  Case discussed with cardiology who will see and admit the patient for heart failure exacerbation.  Final Clinical Impressions(s) / ED Diagnoses   Final diagnoses:  Acute on chronic combined systolic and diastolic CHF (congestive heart failure) [facility])    ED Discharge Orders    None       Jacqlyn Larsen, [place] 02/13/19 1721    Charlesetta Shanks, MD 02/22/19 [phone]

## 2019-02-13 NOTE — Telephone Encounter (Signed)
Per Dr.Bensismhon [name] needs to be evaluated in the ED concerned about kidneys. [name] aware and agreeable with plan.

## 2019-02-14 DIAGNOSIS — I5043 Acute on chronic combined systolic (congestive) and diastolic (congestive) heart failure: Secondary | ICD-10-CM | POA: Diagnosis not present

## 2019-02-14 LAB — BASIC METABOLIC PANEL
Anion gap: 15 (ref 5–15)
Anion gap: 12 (ref 5–15)
BUN: 65 mg/dL — ABNORMAL HIGH (ref 8–23)
BUN: 62 mg/dL — ABNORMAL HIGH (ref 8–23)
CO2: 28 mmol/L (ref 22–32)
CO2: 29 mmol/L (ref 22–32)
Calcium: 9.1 mg/dL (ref 8.9–10.3)
Calcium: 9.1 mg/dL (ref 8.9–10.3)
Chloride: 98 mmol/L (ref 98–111)
Chloride: 100 mmol/L (ref 98–111)
Creatinine, Ser: 3.3 mg/dL — ABNORMAL HIGH (ref 0.61–1.24)
Creatinine, Ser: 3.1 mg/dL — ABNORMAL HIGH (ref 0.61–1.24)
GFR calc Af Amer: 22 mL/min — ABNORMAL LOW (ref >60)
GFR calc Af Amer: 24 mL/min — ABNORMAL LOW (ref >60)
GFR calc non Af Amer: 19 mL/min — ABNORMAL LOW (ref >60)
GFR calc non Af Amer: 20 mL/min — ABNORMAL LOW (ref >60)
Glucose, Bld: 105 mg/dL — ABNORMAL HIGH (ref 70–99)
Glucose, Bld: 144 mg/dL — ABNORMAL HIGH (ref 70–99)
Potassium: 3 mmol/L — ABNORMAL LOW (ref 3.5–5.1)
Potassium: 3.4 mmol/L — ABNORMAL LOW (ref 3.5–5.1)
Sodium: 141 mmol/L (ref 135–145)
Sodium: 141 mmol/L (ref 135–145)

## 2019-02-14 LAB — CBC
HCT: 39.1 % (ref 39.0–52.0)
Hemoglobin: 12.5 g/dL — ABNORMAL LOW (ref 13.0–17.0)
MCH: 31.8 pg (ref 26.0–34.0)
MCHC: 32 g/dL (ref 30.0–36.0)
MCV: 99.5 fL (ref 80.0–100.0)
Platelets: 163 10*3/uL (ref 150–400)
RBC: 3.93 MIL/uL — ABNORMAL LOW (ref 4.22–5.81)
RDW: 16.6 % — ABNORMAL HIGH (ref 11.5–15.5)
WBC: 8 10*3/uL (ref 4.0–10.5)
nRBC: 0 % (ref 0.0–0.2)

## 2019-02-14 LAB — PROTIME-INR
INR: 2.4 — ABNORMAL HIGH (ref 0.8–1.2)
Prothrombin Time: 26 seconds — ABNORMAL HIGH (ref 11.4–15.2)

## 2019-02-14 LAB — HIV ANTIBODY (ROUTINE TESTING W REFLEX): HIV Screen 4th Generation wRfx: NONREACTIVE

## 2019-02-14 LAB — MAGNESIUM: Magnesium: 2.3 mg/dL (ref 1.7–2.4)

## 2019-02-14 LAB — GLUCOSE, CAPILLARY
Glucose-Capillary: 104 mg/dL — ABNORMAL HIGH (ref 70–99)
Glucose-Capillary: 120 mg/dL — ABNORMAL HIGH (ref 70–99)
Glucose-Capillary: 124 mg/dL — ABNORMAL HIGH (ref 70–99)
Glucose-Capillary: 117 mg/dL — ABNORMAL HIGH (ref 70–99)

## 2019-02-14 MED ORDER — MILRINONE LACTATE IN DEXTROSE 20-5 MG/100ML-% IV SOLN
0.1250 ug/kg/min | INTRAVENOUS | Status: DC
  Administered 2019-02-14 – 2019-02-16 (×3): 0.125 ug/kg/min via INTRAVENOUS
  Filled 2019-02-14 (×3): qty 100

## 2019-02-14 MED ORDER — POTASSIUM CHLORIDE CRYS ER 20 MEQ PO TBCR
40.0000 meq | EXTENDED_RELEASE_TABLET | Freq: Once | ORAL | Status: AC
  Administered 2019-02-14: 40 meq via ORAL
  Filled 2019-02-14: qty 2

## 2019-02-14 MED ORDER — WARFARIN SODIUM 5 MG PO TABS
5.0000 mg | ORAL_TABLET | Freq: Once | ORAL | Status: AC
  Administered 2019-02-14: 5 mg via ORAL
  Filled 2019-02-14: qty 1

## 2019-02-14 NOTE — Progress Notes (Signed)
ANTICOAGULATION CONSULT NOTE - Follow Up Consult  Pharmacy Consult for Warfarin Indication: atrial fibrillation  No Known Allergies  Patient Measurements: Height: 5\' 10"  (177.8 cm) Weight: 274 lb 1.6 oz (124.3 kg) IBW/kg (Calculated) : 73  Vital Signs: Temp: 97.6 F (36.4 C) (10/13 0340) Temp Source: Oral (10/13 0340) BP: 114/86 (10/13 0340) Pulse Rate: 87 (10/13 0340)  Labs: Recent Labs    02/13/19 1109 02/13/19 1556 02/14/19 0408  HGB 14.1  --  12.5*  HCT 42.0  --  39.1  PLT 187  --  163  LABPROT  --  25.8* 26.0*  INR  --  2.4* 2.4*  CREATININE 2.76*  --  3.30*  TROPONINIHS 284* 301*  --     Estimated Creatinine Clearance: 30.7 mL/min (A) (by C-G formula based on SCr of 3.3 mg/dL (H)).   Medical History: Past Medical History:  Diagnosis Date  . CAD (coronary artery disease)    a. Initial nonobst by cath 2009. b. Cath 01/2013 in setting of VT storm: obstructive distal Cx disease (small and terminates in the AV groove, unlikely to cause significant ischemia or electrical instability), nonobstructive RCA disease, EF 15-20%.   . Cerebrovascular accident [facility])    a. 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 Chronic systolic CHF (congestive heart failure) ([place])    a. Likely NICM (out of proportion to CAD). b. 2009 - EF 25-30% by echo, 01/2013: 15-20% by cath. c. 11/2014 Echo: EF 35-40%, Gr1 DD, mild MR, mod TR, PASP 73mmHg.  . CKD (chronic kidney disease), stage II   . Dyslipidemia   . Gout   . HTN (hypertension)   . Hypokalemia   . ICD (implantable cardiac defibrillator) in place   . Insulin dependent diabetes mellitus   . Lipoma   . Nonischemic cardiomyopathy ([place])    a.  11/2014 Echo: EF 35-40%, Gr1 DD, mild MR, mod TR, PASP 27mmHg.  . OSA (obstructive sleep apnea)    does not wear cpap  . PAF (paroxysmal atrial fibrillation) ([place])    a. Noted 05/2008 by EKG;  b. CHA2DS2VASc = 5-6-->coumadin.  . Paroxysmal VT ([place])    a. s/p St. Jude ICD 2007. b. H/o paroxysmal VT/VF  including VT storm 12/2012 admission prompting amio initiation;  c. 01/2013 ICD upgrade SJM 1411-36Q Ellipse VR single lead ICD.  [name] Pulmonary HTN ([place])    a. Mild by cath 01/2013.    Medications:  Scheduled:  . amiodarone  200 mg Oral Daily  . atorvastatin  80 mg Oral q1800  . potassium chloride  40 mEq Oral Once  . sodium chloride flush  3 mL Intravenous Q12H  . warfarin  5 mg Oral ONCE-1800  . Warfarin - Pharmacist Dosing Inpatient   Does not apply q1800   Infusions:  . sodium chloride    . furosemide 120 mg (02/13/19 1758)    Assessment: Pt is a 63 y/o male with PMH of CHF (EF 20-25%), prior VT s/p St Jude ICD, CAD, CVA, OSA on CPAP, pulmonary HTN, HTN, DM, CKDs stage III, and PAF on coumadin PTA. Pt presents to the ED with volume overload diuresing with IV furosemide. Pharmacy has been consulted to dose warfarin  Patient's INR again today is 2.4.  His new outpatient warfarin regimen is 5mg  daily, except 2.5 mg on fridays.   CBC and plts are wnls.   Goal of Therapy:  INR 2-3 Monitor platelets by anticoagulation protocol: Yes   Plan:  Give warfarin 5 mg x 1  tonight repeat Monitor for signs/symptoms of bleeding Check daily INR, CBC  Bonnita Nasuti Pharm.D. CPP, BCPS Clinical Pharmacist [phone] 02/14/2019 7:48 AM

## 2019-02-14 NOTE — Progress Notes (Addendum)
Advanced Heart Failure Rounding Note  PCP-Cardiologist: Minus Breeding, MD  [facility]: Dr. Haroldine Laws   Subjective:    Only 1.1L in UOP after high dose diuretics (160 mg IV Lasix followed by 120 mg IV). Increase in SCr from 2.76>>3.30.   Feeling ok this AM. No resting dyspnea. He confirms that he did not notice significant increased urinary response after getting IV Lasix.    Objective:   Weight Range: 124.3 kg Body mass index is 39.33 kg/m.   Vital Signs:   Temp:  [97.6 F (36.4 C)-98.3 F (36.8 C)] 97.6 F (36.4 C) (10/13 0340) Pulse Rate:  [70-87] 87 (10/13 0340) Resp:  [16-25] 20 (10/12 1543) BP: (83-128)/(55-100) 114/86 (10/13 0340) SpO2:  [95 %-100 %] 98 % (10/13 0340) Weight:  [122.5 kg-125.5 kg] 124.3 kg (10/13 0340) Last BM Date: 02/13/19  Weight change: Filed Weights   02/13/19 1105 02/13/19 1543 02/14/19 0340  Weight: 122.5 kg 125.5 kg 124.3 kg    Intake/Output:   Intake/Output Summary (Last 24 hours) at 02/14/2019 0707 Last data filed at 02/14/2019 0340 Gross per 24 hour  Intake 603.12 ml  Output 1150 ml  Net -546.88 ml      Physical Exam    General:  Obese AAM.  No resp difficulty HEENT: Normal Neck: Supple. Elevated JVP to level of ear. Carotids 2+ bilat; no bruits. No lymphadenopathy or thyromegaly appreciated. Cor: PMI nondisplaced. Regular rate & rhythm. No rubs, gallops or murmurs. Lungs: Clear Abdomen: obese, Soft, nontender, nondistended. No hepatosplenomegaly. No bruits or masses. Good bowel sounds. Extremities: No cyanosis, clubbing, rash, 3+ bilateral edema Neuro: Alert & orientedx3, cranial nerves grossly intact. moves all 4 extremities w/o difficulty. Affect pleasant   Telemetry   NSR 60s w/ occasional PVCs   EKG    N/A  Labs    CBC Recent Labs    02/13/19 1109 02/14/19 0408  WBC 8.9 8.0  HGB 14.1 12.5*  HCT 42.0 39.1  MCV 97.9 99.5  PLT 187 854   Basic Metabolic Panel Recent Labs    02/13/19 1109 02/14/19  0408  NA 140 141  K 3.8 3.0*  CL 97* 98  CO2 28 28  GLUCOSE 139* 105*  BUN 66* 65*  CREATININE 2.76* 3.30*  CALCIUM 9.3 9.1   Liver Function Tests Recent Labs    02/13/19 1556  AST 225*  ALT 133*  ALKPHOS 217*  BILITOT 2.8*  PROT 6.8  ALBUMIN 3.0*   No results for input(s): LIPASE, AMYLASE in the last 72 hours. Cardiac Enzymes No results for input(s): CKTOTAL, CKMB, CKMBINDEX, TROPONINI in the last 72 hours.  BNP: BNP (last 3 results) Recent Labs    07/19/18 1106 01/02/19 1552 02/13/19 1114  BNP 1,608.5* 2,154.7* 2,637.3*    ProBNP (last 3 results) Recent Labs    11/01/18 1229  PROBNP 2,158.0*     D-Dimer No results for input(s): DDIMER in the last 72 hours. Hemoglobin A1C No results for input(s): HGBA1C in the last 72 hours. Fasting Lipid Panel No results for input(s): CHOL, HDL, LDLCALC, TRIG, CHOLHDL, LDLDIRECT in the last 72 hours. Thyroid Function Tests No results for input(s): TSH, T4TOTAL, T3FREE, THYROIDAB in the last 72 hours.  Invalid input(s): FREET3  Other results:   Imaging    Dg Chest 2 View  Result Date: 02/13/2019 CLINICAL DATA:  Shortness of breath EXAM: CHEST - 2 VIEW COMPARISON:  January 02, 2019 FINDINGS: Cardiomegaly and pulmonary venous congestion are identified. A single lead AICD device is  stable. No pneumothorax. No focal infiltrate. No other acute abnormalities. IMPRESSION: Cardiomegaly and pulmonary venous congestion. Electronically Signed   By: Dorise Bullion III M.D   On: 02/13/2019 11:49      Medications:     Scheduled Medications: . amiodarone  200 mg Oral Daily  . atorvastatin  80 mg Oral q1800  . sodium chloride flush  3 mL Intravenous Q12H  . Warfarin - Pharmacist Dosing Inpatient   Does not apply q1800     Infusions: . sodium chloride    . furosemide 120 mg (02/13/19 1758)     PRN Medications:  sodium chloride, acetaminophen, sodium chloride flush    Patient Profile   63 y/o male with  severe systolic HF with biventricular dysfunction due to NICM and advanced CKD. Recently hospitalized with low output HF. Now returns with progressive NYHA IV symptoms with marked fluid overload not responding to titration of oral diuretic regimen. With CKD 4, biventricular HF and morbid obesity advanced options are quite limited.  Assessment/Plan    1. Chronic Systolic HF with biventricular dysfunction due to NICM: mild nonobstructive CAD on [facility] 12/2018. CM out of proportion of CAD. PYP scan and myeloma panel completed and were not suggestive of cardiac amyloidosis. S/p St Jude ICD. ECHO 07/28/2017 EF 20-25%. RV mildly dilated. Echo 11/17/18 EF 20-25% RV dilated and severely HK with septal flattening. Presented to ED 10/12 w/ NYHA IV symptoms and markedly volume overload w/ 3+ bilateral LEE, left basilar rales and elevated JVD to level of ear. Wt up 10+ lb from home baseline. BNP 2600 on admit.  -Poor initial response to IV diuretics (received 160 mg IV Lasix followed by 120 mg) w/ only 1.1L out in UOP. SCr increased from  2.76>>3.30.  -With CKD 4, biventricular HF and morbid obesity advanced options are quite limited. -Will need inotrope support>>placement of CVC for Co-ox and CVP monitoring. SBPs in the 90s-low 100s. Would opt for low dose milrinone w/o bolus (less arrhythmogenic than dobutamine - h/o VT and afib).    - ? blocker (coreg) on hold due to low output -Off digoxin due to AKI -Hold bidil for now given soft BP -Off spiro and ACE/ARB due to CKD/AKI -He continues to deteriorate with biventricular dysfunction/PAH and progressive CKD. Only real option seems to be consideration of heart kidney transplant. This was previously discussed with him and he would consider but age and weight may be prohibitive. (BMI 38). Needs palliative care consult to discus GOC.   2. CKD stage 3 - Baseline creatinine ~1.9. Recent Cr 2.8 -> 2.4-> 2.7-> 3.4 -> 2.97->2.76 (admit) -SCr increased after doses of IV  Lasix, now at 3.30. Suspect cardiorenal syndrome -Will start inotropic therapy w/ low dose milrinone and continue to diurese w/ IV Lasix - Monitor closely w/ daily BMPs  3. CAD - LHC8/20with LAD 20%50% distal left circ and 20% mid RCA stenosis. No significant disease in left main or LAD. - No s/s ischemia. Hs Trop 284 but likely demand ischemia in the setting of a/c CHF and abnormal renal function.   - Continue statin. - Hold ? blocker due to soft BP/ concern for low output - No ASA due to chronic coumadin   4. PAF  -s/p recent DC-CV for AFL. In NSR today. - Continue amio 200 daily  - On coumadin, followed by coumadin clinic on [address].  - INR therapeutic at 2.4. Pharmacy to assist w/ dosing   6. Hx of VT - Has St Jude ICD. Device  clinic reports recent 3 atp attempts followed by shock on 10/6.  - Will ask device reps to interrogate -Keep K >4.0 and Mg > 2.0  -Monitor on tele.   7. Pulmonary HTN - On home O2 (2L).  - RHC as above -suspect primarily WHO Group 2 from Left Heart Disease. Also probable component of WHO Group 3 (OSA). Had negative V/Q scan ruling out CTEPH.   8. Hypokalemia: K 3.0 today after getting IV lasix  -will supplement w/ K-dur -f/u BMP in the AM   Length of Stay: 1  Brittainy Simmons, PA-C  02/14/2019, 7:07 AM  Advanced Heart Failure Team Pager [phone] (M-F; 7a - 4p)  Please contact [facility] for night-coverage after hours (4p -7a ) and weekends on [email or web address]  Patient seen and examined with the above-signed Advanced Practice Provider and/or Housestaff. I personally reviewed laboratory data, imaging studies and relevant notes. I independently examined the patient and formulated the important aspects of the plan. I have edited the note to reflect any of my changes or salient points. I have personally discussed the plan with the patient and/or family.  He remains markedly volume overloaded. Minimal response to IV lasix. Creatinine up to 3.3.  Suspect low output.   General:  Sitting up in bed. No resp difficulty HEENT: normal Neck: supple. JVP to ear. Carotids 2+ bilat; no bruits. No lymphadenopathy or thryomegaly appreciated. Cor: PMI nondisplaced. Regular rate & rhythm. +s3 Lungs: crackles at bases Abdomen: obese soft, nontender, nondistended. No hepatosplenomegaly. No bruits or masses. Good bowel sounds. Extremities: no cyanosis, clubbing, rash, 3+ edema Neuro: alert & orientedx3, cranial nerves grossly intact. moves all 4 extremities w/o difficulty. Affect pleasant  He has low output HF with marked volume overload and A/CKD. Will start empiric milrinone. Continue IV lasix. Supp K. Adavanced HF options limited by size and renal failure.   Glori Bickers, MD  11:20 AM

## 2019-02-14 NOTE — Plan of Care (Signed)
  Problem: Nutrition: Goal: Adequate nutrition will be maintained Outcome: Completed/Met   Problem: Coping: Goal: Level of anxiety will decrease Outcome: Completed/Met   Problem: Elimination: Goal: Will not experience complications related to urinary retention Outcome: Completed/Met   

## 2019-02-14 NOTE — Progress Notes (Signed)
Orthopedic Tech Progress Note Patient Details:  [name] [birth year] [id number]  Ortho Devices Type of Ortho Device: Louretta Parma boot Ortho Device/Splint Location: Bilateral unna boots Ortho Device/Splint Interventions: Application   Post Interventions Patient Tolerated: Well Instructions Provided: Care of device   [place] Pink 02/14/2019, 6:15 PM

## 2019-02-15 ENCOUNTER — Other Ambulatory Visit (HOSPITAL_COMMUNITY): Payer: Medicare Other

## 2019-02-15 DIAGNOSIS — I5043 Acute on chronic combined systolic (congestive) and diastolic (congestive) heart failure: Secondary | ICD-10-CM | POA: Diagnosis not present

## 2019-02-15 DIAGNOSIS — I472 Ventricular tachycardia: Secondary | ICD-10-CM | POA: Diagnosis not present

## 2019-02-15 LAB — BASIC METABOLIC PANEL
Anion gap: 12 (ref 5–15)
BUN: 61 mg/dL — ABNORMAL HIGH (ref 8–23)
CO2: 29 mmol/L (ref 22–32)
Calcium: 8.9 mg/dL (ref 8.9–10.3)
Chloride: 99 mmol/L (ref 98–111)
Creatinine, Ser: 3.03 mg/dL — ABNORMAL HIGH (ref 0.61–1.24)
GFR calc Af Amer: 24 mL/min — ABNORMAL LOW (ref >60)
GFR calc non Af Amer: 21 mL/min — ABNORMAL LOW (ref >60)
Glucose, Bld: 115 mg/dL — ABNORMAL HIGH (ref 70–99)
Potassium: 3.5 mmol/L (ref 3.5–5.1)
Sodium: 140 mmol/L (ref 135–145)

## 2019-02-15 LAB — CBC
HCT: 37 % — ABNORMAL LOW (ref 39.0–52.0)
Hemoglobin: 12.4 g/dL — ABNORMAL LOW (ref 13.0–17.0)
MCH: 32.9 pg (ref 26.0–34.0)
MCHC: 33.5 g/dL (ref 30.0–36.0)
MCV: 98.1 fL (ref 80.0–100.0)
Platelets: 169 10*3/uL (ref 150–400)
RBC: 3.77 MIL/uL — ABNORMAL LOW (ref 4.22–5.81)
RDW: 16.6 % — ABNORMAL HIGH (ref 11.5–15.5)
WBC: 7.7 10*3/uL (ref 4.0–10.5)
nRBC: 0 % (ref 0.0–0.2)

## 2019-02-15 LAB — GLUCOSE, CAPILLARY
Glucose-Capillary: 124 mg/dL — ABNORMAL HIGH (ref 70–99)
Glucose-Capillary: 117 mg/dL — ABNORMAL HIGH (ref 70–99)
Glucose-Capillary: 134 mg/dL — ABNORMAL HIGH (ref 70–99)
Glucose-Capillary: 113 mg/dL — ABNORMAL HIGH (ref 70–99)

## 2019-02-15 LAB — MAGNESIUM: Magnesium: 2.4 mg/dL (ref 1.7–2.4)

## 2019-02-15 LAB — PROTIME-INR
INR: 2.7 — ABNORMAL HIGH (ref 0.8–1.2)
Prothrombin Time: 27.9 seconds — ABNORMAL HIGH (ref 11.4–15.2)

## 2019-02-15 MED ORDER — SODIUM CHLORIDE 0.9 % IV SOLN
INTRAVENOUS | Status: DC
  Administered 2019-02-16: 06:00:00 via INTRAVENOUS

## 2019-02-15 MED ORDER — AMIODARONE HCL IN DEXTROSE 360-4.14 MG/200ML-% IV SOLN
60.0000 mg/h | INTRAVENOUS | Status: AC
  Administered 2019-02-15: 60 mg/h via INTRAVENOUS
  Filled 2019-02-15: qty 200

## 2019-02-15 MED ORDER — ASPIRIN 81 MG PO CHEW
81.0000 mg | CHEWABLE_TABLET | ORAL | Status: AC
  Administered 2019-02-16: 81 mg via ORAL
  Filled 2019-02-15: qty 1

## 2019-02-15 MED ORDER — SODIUM CHLORIDE 0.9% FLUSH
3.0000 mL | Freq: Two times a day (BID) | INTRAVENOUS | Status: DC
  Administered 2019-02-15 – 2019-02-22 (×4): 3 mL via INTRAVENOUS

## 2019-02-15 MED ORDER — AMIODARONE HCL IN DEXTROSE 360-4.14 MG/200ML-% IV SOLN
30.0000 mg/h | INTRAVENOUS | Status: DC
  Administered 2019-02-15 – 2019-02-16 (×4): 30 mg/h via INTRAVENOUS
  Filled 2019-02-15 (×3): qty 200

## 2019-02-15 MED ORDER — SODIUM CHLORIDE 0.9% FLUSH: 3.0000 mL | INTRAVENOUS | Status: DC | PRN

## 2019-02-15 MED ORDER — POTASSIUM CHLORIDE CRYS ER 20 MEQ PO TBCR
40.0000 meq | EXTENDED_RELEASE_TABLET | Freq: Once | ORAL | Status: AC
  Administered 2019-02-15: 09:00:00 40 meq via ORAL
  Filled 2019-02-15: qty 2

## 2019-02-15 MED ORDER — POTASSIUM CHLORIDE CRYS ER 20 MEQ PO TBCR: 20.0000 meq | EXTENDED_RELEASE_TABLET | Freq: Once | ORAL | Status: DC

## 2019-02-15 MED ORDER — FUROSEMIDE 10 MG/ML IJ SOLN
120.0000 mg | Freq: Three times a day (TID) | INTRAVENOUS | Status: DC
  Filled 2019-02-15 (×2): qty 12

## 2019-02-15 MED ORDER — POTASSIUM CHLORIDE CRYS ER 20 MEQ PO TBCR
40.0000 meq | EXTENDED_RELEASE_TABLET | Freq: Once | ORAL | Status: AC
  Administered 2019-02-15: 19:00:00 40 meq via ORAL
  Filled 2019-02-15: qty 2

## 2019-02-15 MED ORDER — SODIUM CHLORIDE 0.9 % IV SOLN: 250.0000 mL | INTRAVENOUS | Status: DC | PRN

## 2019-02-15 MED ORDER — FUROSEMIDE 10 MG/ML IJ SOLN
120.0000 mg | Freq: Three times a day (TID) | INTRAVENOUS | Status: DC
  Administered 2019-02-15 (×3): 120 mg via INTRAVENOUS
  Filled 2019-02-15 (×6): qty 12
  Filled 2019-02-15: qty 10

## 2019-02-15 MED ORDER — WARFARIN SODIUM 4 MG PO TABS
4.0000 mg | ORAL_TABLET | Freq: Once | ORAL | Status: AC
  Administered 2019-02-15: 4 mg via ORAL
  Filled 2019-02-15: qty 1

## 2019-02-15 NOTE — Progress Notes (Signed)
ANTICOAGULATION CONSULT NOTE - Follow Up Consult  Pharmacy Consult for Warfarin Indication: atrial fibrillation  No Known Allergies  Patient Measurements: Height: 5\' 10"  (177.8 cm) Weight: 275 lb 12.8 oz (125.1 kg) IBW/kg (Calculated) : 73  Vital Signs: Temp: 97.7 F (36.5 C) (10/14 0326) Temp Source: Oral (10/14 0326) BP: 98/84 (10/14 0326) Pulse Rate: 88 (10/14 0326)  Labs: Recent Labs    02/13/19 1109 02/13/19 1556 02/14/19 0408 02/14/19 1645 02/15/19 0540  HGB 14.1  --  12.5*  --  12.4*  HCT 42.0  --  39.1  --  37.0*  PLT 187  --  163  --  169  LABPROT  --  25.8* 26.0*  --  27.9*  INR  --  2.4* 2.4*  --  2.7*  CREATININE 2.76*  --  3.30* 3.10* 3.03*  TROPONINIHS 284* 301*  --   --   --     Estimated Creatinine Clearance: 33.5 mL/min (A) (by C-G formula based on SCr of 3.03 mg/dL (H)).   Medical History: Past Medical History:  Diagnosis Date  . CAD (coronary artery disease)    a. Initial nonobst by cath 2009. b. Cath 01/2013 in setting of VT storm: obstructive distal Cx disease (small and terminates in the AV groove, unlikely to cause significant ischemia or electrical instability), nonobstructive RCA disease, EF 15-20%.   . Cerebrovascular accident [facility])    a. 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 Chronic systolic CHF (congestive heart failure) ([place])    a. Likely NICM (out of proportion to CAD). b. 2009 - EF 25-30% by echo, 01/2013: 15-20% by cath. c. 11/2014 Echo: EF 35-40%, Gr1 DD, mild MR, mod TR, PASP 53mmHg.  . CKD (chronic kidney disease), stage II   . Dyslipidemia   . Gout   . HTN (hypertension)   . Hypokalemia   . ICD (implantable cardiac defibrillator) in place   . Insulin dependent diabetes mellitus   . Lipoma   . Nonischemic cardiomyopathy ([place])    a.  11/2014 Echo: EF 35-40%, Gr1 DD, mild MR, mod TR, PASP 16mmHg.  . OSA (obstructive sleep apnea)    does not wear cpap  . PAF (paroxysmal atrial fibrillation) ([place])    a. Noted 05/2008 by EKG;  b.  CHA2DS2VASc = 5-6-->coumadin.  . Paroxysmal VT ([place])    a. s/p St. Jude ICD 2007. b. H/o paroxysmal VT/VF including VT storm 12/2012 admission prompting amio initiation;  c. 01/2013 ICD upgrade SJM [id number] Ellipse VR single lead ICD.  [name] Pulmonary HTN ([place])    a. Mild by cath 01/2013.    Medications:  Scheduled:  . amiodarone  200 mg Oral Daily  . atorvastatin  80 mg Oral q1800  . sodium chloride flush  3 mL Intravenous Q12H  . Warfarin - Pharmacist Dosing Inpatient   Does not apply q1800   Infusions:  . sodium chloride    . furosemide 120 mg (02/14/19 1909)  . milrinone 0.125 mcg/kg/min (02/14/19 1128)    Assessment: Pt is a 63 y/o [name] with PMH of CHF (EF 20-25%), prior VT s/p St Jude ICD, CAD, CVA, OSA on CPAP, pulmonary HTN, HTN, DM, CKDs stage III, and PAF on coumadin PTA. Pt presents to the ED with volume overload diuresing with IV furosemide. Pharmacy has been consulted to dose warfarin  Patient's INR today is 2.7 (up from 2.4). CBC is stable with Hgb ~12, plt WNL. No s/sx of bleeding per notes.  His new outpatient warfarin regimen is  5mg  daily, except 2.5 mg on fridays.    Goal of Therapy:  INR 2-3 Monitor platelets by anticoagulation protocol: Yes   Plan:  Give warfarin 4 mg x 1 tonight to trend INR down slightly in case of need for RHC Monitor for signs/symptoms of bleeding Check daily INR, CBC  Darius [name], PharmD, BCCCP Clinical Pharmacist  Phone: [phone]  Please check AMION for all [place] phone numbers After 10:00 PM, call [place] [phone] 02/15/2019 7:38 AM

## 2019-02-15 NOTE — Progress Notes (Addendum)
Dr Haroldine Laws paged regarding Pts increased frequency of runs of non sustained VT and given results of STAT BMP and Mg. K 3.4 and Mg 2.3. Pt is asymptomatic, denies CP/dizziness/or any other complaint at this time. VS remain stable.MD also clarified no PICC line/CVP/Co-ox orders placed at this time. Orders received for K supplementation and will continue to monitor. Jessie Foot, RN

## 2019-02-15 NOTE — Progress Notes (Signed)
Pt has continued to have intermittent 9-13 bts NSVT throughout the night but ectopy is  beginning to have increased frequency and up to 19-20 bts. Pt remains asymptomatic Lab called to draw am labs STAT , will continue to monitor and notify MD when labs available. Jessie Foot, RN

## 2019-02-15 NOTE — Progress Notes (Addendum)
Advanced Heart Failure Rounding Note  PCP-Cardiologist: Minus Breeding, MD  [facility]: Dr. Haroldine Laws   Subjective:    Slight improvement in UOP after initiation of milrinone. -1.4 L out overnight. Slight improvement in SCr down from 3.10>>3.03   He feels a bit better. No resting dyspnea. Legs feel less tight.   Frequent NSVT on tele but completely asymptomatic.    Objective:   Weight Range: 125.1 kg Body mass index is 39.57 kg/m.   Vital Signs:   Temp:  [97.5 F (36.4 C)-97.7 F (36.5 C)] 97.7 F (36.5 C) (10/14 0326) Pulse Rate:  [57-88] 88 (10/14 0326) Resp:  [18-20] 20 (10/13 2324) BP: (98-107)/(84-88) 98/84 (10/14 0326) SpO2:  [95 %-98 %] 95 % (10/14 0326) Weight:  [125.1 kg] 125.1 kg (10/14 0326) Last BM Date: 02/13/19  Weight change: Filed Weights   02/13/19 1543 02/14/19 0340 02/15/19 0326  Weight: 125.5 kg 124.3 kg 125.1 kg    Intake/Output:   Intake/Output Summary (Last 24 hours) at 02/15/2019 0744 Last data filed at 02/15/2019 0649 Gross per 24 hour  Intake 796.35 ml  Output 1475 ml  Net -678.65 ml      Physical Exam    PHYSICAL EXAM: General:  Obese AAM No respiratory difficulty HEENT: normal anicteric  Neck: supple. Elevated JVD to level of ear. Carotids 2+ bilat; no bruits. No lymphadenopathy or thyromegaly appreciated. Cor: PMI nondisplaced. Regular rate & rhythm. + ectopy  +s3  Lungs: bibasilar crackles  No wheeze Abdomen: obese, soft, nontender, nondistended. No hepatosplenomegaly. No bruits or masses. Good bowel sounds. Extremities: no cyanosis, clubbing, rash, 3+ bilateral LEE, bilateral unna boots Neuro: alert & oriented x 3, cranial nerves grossly intact. moves all 4 extremities w/o difficulty. Affect pleasant  Telemetry   NSR w/ frequent ventricular ectopy, frequent PVCs and runs of NSVT up to 20 beats. Completely asymptomatic   EKG    N/A  Labs    CBC Recent Labs    02/14/19 0408 02/15/19 0540  WBC 8.0 7.7  HGB 12.5*  12.4*  HCT 39.1 37.0*  MCV 99.5 98.1  PLT 163 740   Basic Metabolic Panel Recent Labs    02/14/19 1645 02/15/19 0540  NA 141 140  K 3.4* 3.5  CL 100 99  CO2 29 29  GLUCOSE 144* 115*  BUN 62* 61*  CREATININE 3.10* 3.03*  CALCIUM 9.1 8.9  MG 2.3 2.4   Liver Function Tests Recent Labs    02/13/19 1556  AST 225*  ALT 133*  ALKPHOS 217*  BILITOT 2.8*  PROT 6.8  ALBUMIN 3.0*   No results for input(s): LIPASE, AMYLASE in the last 72 hours. Cardiac Enzymes No results for input(s): CKTOTAL, CKMB, CKMBINDEX, TROPONINI in the last 72 hours.  BNP: BNP (last 3 results) Recent Labs    07/19/18 1106 01/02/19 1552 02/13/19 1114  BNP 1,608.5* 2,154.7* 2,637.3*    ProBNP (last 3 results) Recent Labs    11/01/18 1229  PROBNP 2,158.0*     D-Dimer No results for input(s): DDIMER in the last 72 hours. Hemoglobin A1C No results for input(s): HGBA1C in the last 72 hours. Fasting Lipid Panel No results for input(s): CHOL, HDL, LDLCALC, TRIG, CHOLHDL, LDLDIRECT in the last 72 hours. Thyroid Function Tests No results for input(s): TSH, T4TOTAL, T3FREE, THYROIDAB in the last 72 hours.  Invalid input(s): FREET3  Other results:   Imaging    No results found.   Medications:     Scheduled Medications: . amiodarone  200  mg Oral Daily  . atorvastatin  80 mg Oral q1800  . sodium chloride flush  3 mL Intravenous Q12H  . Warfarin - Pharmacist Dosing Inpatient   Does not apply q1800    Infusions: . sodium chloride    . furosemide 120 mg (02/14/19 1909)  . milrinone 0.125 mcg/kg/min (02/14/19 1128)    PRN Medications: sodium chloride, acetaminophen, sodium chloride flush    Patient Profile   63 y/o male with severe systolic HF with biventricular dysfunction due to NICM and advanced CKD. Recently hospitalized with low output HF. Now returns with progressive NYHA IV symptoms with marked fluid overload not responding to titration of oral diuretic regimen. With  CKD 4, biventricular HF and morbid obesity advanced options are quite limited.  Assessment/Plan    1. Chronic Systolic HF with biventricular dysfunction due to NICM: mild nonobstructive CAD on [facility] 12/2018. CM out of proportion of CAD. PYP scan and myeloma panel completed and were not suggestive of cardiac amyloidosis. S/p St Jude ICD. ECHO 07/28/2017 EF 20-25%. RV mildly dilated. Echo 11/17/18 EF 20-25% RV dilated and severely HK with septal flattening. Presented to ED 10/12 w/ NYHA IV symptoms and markedly volume overload w/ 3+ bilateral LEE, left basilar rales and elevated JVD to level of ear. Wt up 10+ lb from home baseline. BNP 2600 on admit.  -Poor initial response to high dose IV diuretics w/ increase in SCr from  2.76>>3.30, prompting initiation of inotropic support, started on milrinone. Needs CVC for Co-ox and CVP monitoring. -Slight improvement in UOP after addition of milrinone. -1.4L out yesterday. Remains markedly volume overloaded. SCr trending down. Continue IV diuretics. May need to change lasix to tid dosing.  -continue unna boots  -With CKD 4, biventricular HF and morbid obesity, advanced options are quite limited. - ? blocker (coreg) on hold due to low output -Off digoxin due to AKI -Hold bidil for now given soft BP -Off spiro and ACE/ARB due to CKD/AKI -He continues to deteriorate with biventricular dysfunction/PAH and progressive CKD. Only real option seems to be consideration of heart kidney transplant. This was previously discussed with him and he would consider but age and weight may be prohibitive. (BMI 38). Needs palliative care consult to discus GOC.   2. CKD stage 3 - Baseline creatinine ~1.9. Recent Cr 2.8 -> 2.4-> 2.7-> 3.4 -> 2.97->2.76 (admit) - SCr increased after doses of IV Lasix>>3.30. Suspect cardiorenal syndrome - Slight improvement after addition of milrinone. SCr down from 3.10>>3.03 - Continue to monitor closely w/ daily BMPs  3. CAD - LHC8/20with  LAD 20%50% distal left circ and 20% mid RCA stenosis. No significant disease in left main or LAD. - No s/s ischemia. Hs Trop 284 but likely demand ischemia in the setting of a/c CHF and abnormal renal function.   - Continue statin. - Hold ? blocker due to soft BP/ concern for low output - No ASA due to chronic coumadin  4. PAF  -s/p recent DC-CV for AFL. In NSR today w/ frequent ventricular ectopy. - change from PO to IV amiodarone - On coumadin, followed by coumadin clinic on [address].  - INR therapeutic at 2.7. Pharmacy to assist w/ dosing   6. VT -Has St Jude ICD.  -Had VT episode (rate 190 bpm) w/ delivery of shock on 02/07/19  -Frequent NSVT on tele, up to 20 beats. Completely asymptomatic.  - Will need to closely monitor while on milrinone -Will stop PO amiodarone and start amiodarone drip. No bolus given soft  BP -Keep K >4.0 and Mg > 2.0  -K 3.5 this am. Will supp K. Mg 2.4 today. -Monitor on tele.   7. Pulmonary HTN - On home O2 (2L).  - RHC as above -suspect primarily WHO Group 2 from Left Heart Disease. Also probable component of WHO Group 3 (OSA). Had negative V/Q scan ruling out CTEPH.   8. Hypokalemia: K 3.5 today after getting IV lasix  -will supplement w/ K-dur -f/u BMP in the AM   Length of Stay: 2  Lyda Jester, PA-C  02/15/2019, 7:44 AM  Advanced Heart Failure Team Pager [phone] (M-F; 7a - 4p)  Please contact [facility] for night-coverage after hours (4p -7a ) and weekends on [email or web address]   Patient seen and examined with the above-signed Advanced Practice Provider and/or Housestaff. I personally reviewed laboratory data, imaging studies and relevant notes. I independently examined the patient and formulated the important aspects of the plan. I have edited the note to reflect any of my changes or salient points. I have personally discussed the plan with the patient and/or family.   Reports modest diuresis overnight on IV lasix and milrinone but  weight actually up. Have extensive ectopy with multiple runs NSVT. Started on IV amio.  Creatinine improved to 3.0 with milrinone support.   Will increase IV lasix. Continue milrinone and IV amio. Plan probable RHC and central line placement tomorrow (not candidate for PICC with CKD unless absolutely necessary). Supp K   Glori Bickers, MD  3:14 PM

## 2019-02-16 ENCOUNTER — Encounter (HOSPITAL_COMMUNITY)
Admission: EM | Disposition: A | Discharge status: Home Health | Payer: Self-pay | Source: Home / Self Care | Attending: Internal Medicine

## 2019-02-16 DIAGNOSIS — N183 Chronic kidney disease, stage 3 unspecified: Secondary | ICD-10-CM

## 2019-02-16 DIAGNOSIS — I472 Ventricular tachycardia: Secondary | ICD-10-CM | POA: Diagnosis not present

## 2019-02-16 DIAGNOSIS — I5043 Acute on chronic combined systolic (congestive) and diastolic (congestive) heart failure: Secondary | ICD-10-CM | POA: Diagnosis not present

## 2019-02-16 HISTORY — PX: RIGHT HEART CATH: CATH118263

## 2019-02-16 HISTORY — PX: CENTRAL LINE INSERTION: CATH118232

## 2019-02-16 LAB — POCT I-STAT EG7
Acid-Base Excess: 5 mmol/L — ABNORMAL HIGH (ref 0.0–2.0)
Acid-Base Excess: 4 mmol/L — ABNORMAL HIGH (ref 0.0–2.0)
Bicarbonate: 31.1 mmol/L — ABNORMAL HIGH (ref 20.0–28.0)
Bicarbonate: 30 mmol/L — ABNORMAL HIGH (ref 20.0–28.0)
Calcium, Ion: 1.2 mmol/L (ref 1.15–1.40)
Calcium, Ion: 1.12 mmol/L — ABNORMAL LOW (ref 1.15–1.40)
HCT: 39 % (ref 39.0–52.0)
HCT: 37 % — ABNORMAL LOW (ref 39.0–52.0)
Hemoglobin: 13.3 g/dL (ref 13.0–17.0)
Hemoglobin: 12.6 g/dL — ABNORMAL LOW (ref 13.0–17.0)
O2 Saturation: 61 %
O2 Saturation: 62 %
Potassium: 3.7 mmol/L (ref 3.5–5.1)
Potassium: 3.4 mmol/L — ABNORMAL LOW (ref 3.5–5.1)
Sodium: 142 mmol/L (ref 135–145)
Sodium: 143 mmol/L (ref 135–145)
TCO2: 33 mmol/L — ABNORMAL HIGH (ref 22–32)
TCO2: 31 mmol/L (ref 22–32)
pCO2, Ven: 49.4 mmHg (ref 44.0–60.0)
pCO2, Ven: 48.1 mmHg (ref 44.0–60.0)
pH, Ven: 7.408 (ref 7.250–7.430)
pH, Ven: 7.404 (ref 7.250–7.430)
pO2, Ven: 32 mmHg (ref 32.0–45.0)
pO2, Ven: 32 mmHg (ref 32.0–45.0)

## 2019-02-16 LAB — BASIC METABOLIC PANEL
Anion gap: 14 (ref 5–15)
BUN: 62 mg/dL — ABNORMAL HIGH (ref 8–23)
CO2: 28 mmol/L (ref 22–32)
Calcium: 8.9 mg/dL (ref 8.9–10.3)
Chloride: 98 mmol/L (ref 98–111)
Creatinine, Ser: 2.97 mg/dL — ABNORMAL HIGH (ref 0.61–1.24)
GFR calc Af Amer: 25 mL/min — ABNORMAL LOW (ref >60)
GFR calc non Af Amer: 22 mL/min — ABNORMAL LOW (ref >60)
Glucose, Bld: 100 mg/dL — ABNORMAL HIGH (ref 70–99)
Potassium: 3.9 mmol/L (ref 3.5–5.1)
Sodium: 140 mmol/L (ref 135–145)

## 2019-02-16 LAB — CBC
HCT: 37.7 % — ABNORMAL LOW (ref 39.0–52.0)
Hemoglobin: 12.4 g/dL — ABNORMAL LOW (ref 13.0–17.0)
MCH: 32.3 pg (ref 26.0–34.0)
MCHC: 32.9 g/dL (ref 30.0–36.0)
MCV: 98.2 fL (ref 80.0–100.0)
Platelets: 171 10*3/uL (ref 150–400)
RBC: 3.84 MIL/uL — ABNORMAL LOW (ref 4.22–5.81)
RDW: 16.6 % — ABNORMAL HIGH (ref 11.5–15.5)
WBC: 8.1 10*3/uL (ref 4.0–10.5)
nRBC: 0 % (ref 0.0–0.2)

## 2019-02-16 LAB — PROTIME-INR
INR: 2.9 — ABNORMAL HIGH (ref 0.8–1.2)
Prothrombin Time: 29.7 seconds — ABNORMAL HIGH (ref 11.4–15.2)

## 2019-02-16 LAB — GLUCOSE, CAPILLARY
Glucose-Capillary: 87 mg/dL (ref 70–99)
Glucose-Capillary: 137 mg/dL — ABNORMAL HIGH (ref 70–99)

## 2019-02-16 LAB — MAGNESIUM: Magnesium: 2.4 mg/dL (ref 1.7–2.4)

## 2019-02-16 SURGERY — RIGHT HEART CATH
Anesthesia: LOCAL

## 2019-02-16 MED ORDER — FENTANYL CITRATE (PF) 100 MCG/2ML IJ SOLN
INTRAMUSCULAR | Status: AC
  Filled 2019-02-16: qty 2

## 2019-02-16 MED ORDER — LIDOCAINE HCL (PF) 1 % IJ SOLN
INTRAMUSCULAR | Status: DC | PRN
  Administered 2019-02-16: 6 mL
  Administered 2019-02-16: 5 mL

## 2019-02-16 MED ORDER — POTASSIUM CHLORIDE CRYS ER 20 MEQ PO TBCR
40.0000 meq | EXTENDED_RELEASE_TABLET | Freq: Once | ORAL | Status: AC
  Administered 2019-02-16: 40 meq via ORAL
  Filled 2019-02-16: qty 2

## 2019-02-16 MED ORDER — WARFARIN SODIUM 2.5 MG PO TABS
2.5000 mg | ORAL_TABLET | Freq: Once | ORAL | Status: AC
  Administered 2019-02-16: 2.5 mg via ORAL
  Filled 2019-02-16: qty 1

## 2019-02-16 MED ORDER — SODIUM CHLORIDE 0.9% FLUSH: 3.0000 mL | INTRAVENOUS | Status: DC | PRN

## 2019-02-16 MED ORDER — LIDOCAINE HCL (PF) 1 % IJ SOLN
INTRAMUSCULAR | Status: AC
  Filled 2019-02-16: qty 30

## 2019-02-16 MED ORDER — ONDANSETRON HCL 4 MG/2ML IJ SOLN: 4.0000 mg | Freq: Four times a day (QID) | INTRAMUSCULAR | Status: DC | PRN

## 2019-02-16 MED ORDER — POTASSIUM CHLORIDE CRYS ER 20 MEQ PO TBCR
40.0000 meq | EXTENDED_RELEASE_TABLET | Freq: Once | ORAL | Status: AC
  Administered 2019-02-16: 40 meq via ORAL
  Filled 2019-02-16 (×2): qty 2

## 2019-02-16 MED ORDER — SODIUM CHLORIDE 0.9% FLUSH
3.0000 mL | Freq: Two times a day (BID) | INTRAVENOUS | Status: DC
  Administered 2019-02-20 – 2019-02-22 (×4): 3 mL via INTRAVENOUS

## 2019-02-16 MED ORDER — SODIUM CHLORIDE 0.9 % IV SOLN
250.0000 mL | INTRAVENOUS | Status: DC | PRN
  Administered 2019-03-02: 14:00:00 via INTRAVENOUS

## 2019-02-16 MED ORDER — ACETAMINOPHEN 325 MG PO TABS: 650.0000 mg | ORAL_TABLET | ORAL | Status: DC | PRN

## 2019-02-16 MED ORDER — HYDRALAZINE HCL 20 MG/ML IJ SOLN: 10.0000 mg | INTRAMUSCULAR | Status: AC | PRN

## 2019-02-16 MED ORDER — LABETALOL HCL 5 MG/ML IV SOLN: 10.0000 mg | INTRAVENOUS | Status: AC | PRN

## 2019-02-16 MED ORDER — MIDAZOLAM HCL 2 MG/2ML IJ SOLN
INTRAMUSCULAR | Status: DC | PRN
  Administered 2019-02-16 (×2): 1 mg via INTRAVENOUS

## 2019-02-16 MED ORDER — SILDENAFIL CITRATE 20 MG PO TABS
20.0000 mg | ORAL_TABLET | Freq: Three times a day (TID) | ORAL | Status: DC
  Administered 2019-02-16 – 2019-02-20 (×13): 20 mg via ORAL
  Filled 2019-02-16 (×13): qty 1

## 2019-02-16 MED ORDER — CHLORHEXIDINE GLUCONATE CLOTH 2 % EX PADS
6.0000 | MEDICATED_PAD | Freq: Every day | CUTANEOUS | Status: DC
  Administered 2019-02-17 – 2019-03-03 (×15): 6 via TOPICAL

## 2019-02-16 MED ORDER — MIDAZOLAM HCL 2 MG/2ML IJ SOLN
INTRAMUSCULAR | Status: AC
  Filled 2019-02-16: qty 2

## 2019-02-16 MED ORDER — HEPARIN (PORCINE) IN NACL 1000-0.9 UT/500ML-% IV SOLN
INTRAVENOUS | Status: DC | PRN
  Administered 2019-02-16: 500 mL

## 2019-02-16 MED ORDER — AMIODARONE HCL IN DEXTROSE 360-4.14 MG/200ML-% IV SOLN
INTRAVENOUS | Status: AC
  Filled 2019-02-16: qty 200

## 2019-02-16 MED ORDER — FENTANYL CITRATE (PF) 100 MCG/2ML IJ SOLN
INTRAMUSCULAR | Status: DC | PRN
  Administered 2019-02-16 (×2): 25 ug via INTRAVENOUS

## 2019-02-16 MED ORDER — HEPARIN (PORCINE) IN NACL 1000-0.9 UT/500ML-% IV SOLN
INTRAVENOUS | Status: AC
  Filled 2019-02-16: qty 500

## 2019-02-16 SURGICAL SUPPLY — 8 items
CATH SWAN GANZ 7F STRAIGHT (CATHETERS) ×3 IMPLANT
ELECT DEFIB PAD ADLT CADENCE (PAD) ×3 IMPLANT
HOVERMATT SINGLE USE (MISCELLANEOUS) ×3 IMPLANT
PACK CARDIAC CATHETERIZATION (CUSTOM PROCEDURE TRAY) ×3 IMPLANT
SHEATH PINNACLE 7F 10CM (SHEATH) ×3 IMPLANT
SHEATH PROBE COVER 6X72 (BAG) ×3 IMPLANT
TRANSDUCER W/STOPCOCK (MISCELLANEOUS) ×3 IMPLANT
TRAY CATH 3LUMEN 20C SULFAFREE (CATHETERS) ×3 IMPLANT

## 2019-02-16 NOTE — Care Management (Signed)
02-16-19 1420 Benefits Check submitted for [employer]-CM will make patient aware of cost once completed. Bethena Roys, RN,BSN Case Manager [phone]

## 2019-02-16 NOTE — TOC Benefit Eligibility Note (Signed)
Transition of Care [facility]) Benefit Eligibility Note    Patient Details  Name: [name] MRN: [id number] Date of Birth: [birth year]   Medication/Dose: REVATIO TABLET 20 MG   AND  SILDENAFIL TABLET 20 MG  Covered?: No        Spoke with Person/Company/Phone Number:: JESSICA @ [employer] # [phone]     Prior Approval: [phone])     Additional Notes: BOTH MEDICATION  NOT Calton Golds Phone Number: 02/16/2019, 3:12 PM

## 2019-02-16 NOTE — Progress Notes (Addendum)
42 beats VT 1239.  Patient endorsed dizziness.  Darrick Grinder, NP notified and aware, states she is coming to assess patient.

## 2019-02-16 NOTE — Progress Notes (Signed)
   Called by nursing staff for 42 beats NSVT at and frequent PVCs.   Had dizziness at that time.   Give 40 meq potassium now and stop milrinone.   Dr Haroldine Laws at bedside and agrees with plan.    Amy Clegg NP-C  3:44 PM

## 2019-02-16 NOTE — Progress Notes (Addendum)
Advanced Heart Failure Rounding Note  PCP-Cardiologist: Minus Breeding, MD  [facility]: Dr. Haroldine Laws   Subjective:    Wt continues to trend up, 274>>275>>276lb . Only 500 cc UOP documented yesterday but pt notes subjective improvement.   No complaints this morning. Denies resting dyspnea. Asymptomatic w/ NSVT. States he is starting to urinate more.     Objective:   Weight Range: 125.6 kg Body mass index is 39.73 kg/m.   Vital Signs:   Temp:  [97.6 F (36.4 C)-98.3 F (36.8 C)] 97.9 F (36.6 C) (10/15 0658) Pulse Rate:  [76-115] 86 (10/15 0658) Resp:  [16-19] 16 (10/15 0658) BP: (92-122)/(58-91) 122/91 (10/15 0658) SpO2:  [91 %-97 %] 94 % (10/15 0658) Weight:  [125.6 kg] 125.6 kg (10/15 0658) Last BM Date: 02/13/19  Weight change: Filed Weights   02/14/19 0340 02/15/19 0326 02/16/19 0658  Weight: 124.3 kg 125.1 kg 125.6 kg    Intake/Output:   Intake/Output Summary (Last 24 hours) at 02/16/2019 0716 Last data filed at 02/16/2019 0400 Gross per 24 hour  Intake 2564.35 ml  Output 500 ml  Net 2064.35 ml      Physical Exam    PHYSICAL EXAM: General:  Obese male. Well appearing. No respiratory difficulty HEENT: normal anicteric Neck: supple. Elevated JVD above ear. Carotids 2+ bilat; no bruits. No lymphadenopathy or thyromegaly appreciated. Cor: PMI nondisplaced. Regular rate & rhythm. +s3 Lungs: faint crackles at the bases  No wheeze Abdomen: obese soft, nontender, nondistended. No hepatosplenomegaly. No bruits or masses. Good bowel sounds. Extremities: no cyanosis, clubbing, rash, 3+ bilateral edema, bilateral unna boots  Neuro: alert & oriented x 3, cranial nerves grossly intact. moves all 4 extremities w/o difficulty. Affect pleasant   Telemetry   NSR low 80s, still w/ NSVT but less frequent   EKG    N/A  Labs    CBC Recent Labs    02/15/19 0540 02/16/19 0315  WBC 7.7 8.1  HGB 12.4* 12.4*  HCT 37.0* 37.7*  MCV 98.1 98.2  PLT 169 470    Basic Metabolic Panel Recent Labs    02/15/19 0540 02/16/19 0315  NA 140 140  K 3.5 3.9  CL 99 98  CO2 29 28  GLUCOSE 115* 100*  BUN 61* 62*  CREATININE 3.03* 2.97*  CALCIUM 8.9 8.9  MG 2.4 2.4   Liver Function Tests Recent Labs    02/13/19 1556  AST 225*  ALT 133*  ALKPHOS 217*  BILITOT 2.8*  PROT 6.8  ALBUMIN 3.0*   No results for input(s): LIPASE, AMYLASE in the last 72 hours. Cardiac Enzymes No results for input(s): CKTOTAL, CKMB, CKMBINDEX, TROPONINI in the last 72 hours.  BNP: BNP (last 3 results) Recent Labs    07/19/18 1106 01/02/19 1552 02/13/19 1114  BNP 1,608.5* 2,154.7* 2,637.3*    ProBNP (last 3 results) Recent Labs    11/01/18 1229  PROBNP 2,158.0*     D-Dimer No results for input(s): DDIMER in the last 72 hours. Hemoglobin A1C No results for input(s): HGBA1C in the last 72 hours. Fasting Lipid Panel No results for input(s): CHOL, HDL, LDLCALC, TRIG, CHOLHDL, LDLDIRECT in the last 72 hours. Thyroid Function Tests No results for input(s): TSH, T4TOTAL, T3FREE, THYROIDAB in the last 72 hours.  Invalid input(s): FREET3  Other results:   Imaging    No results found.   Medications:     Scheduled Medications: . atorvastatin  80 mg Oral q1800  . sodium chloride flush  3 mL Intravenous  Q12H  . sodium chloride flush  3 mL Intravenous Q12H  . Warfarin - Pharmacist Dosing Inpatient   Does not apply q1800    Infusions: . sodium chloride    . sodium chloride    . sodium chloride 10 mL/hr at 02/16/19 0551  . amiodarone 30 mg/hr (02/16/19 0400)  . furosemide 120 mg (02/15/19 2131)  . milrinone 0.125 mcg/kg/min (02/16/19 0400)    PRN Medications: sodium chloride, sodium chloride, acetaminophen, sodium chloride flush, sodium chloride flush    Patient Profile   63 y/o male with severe systolic HF with biventricular dysfunction due to NICM and advanced CKD. Recently hospitalized with low output HF. Now returns with progressive  NYHA IV symptoms with marked fluid overload not responding to titration of oral diuretic regimen. With CKD 4, biventricular HF and morbid obesity advanced options are quite limited.  Assessment/Plan    1. Acute on Chronic Systolic HF with biventricular dysfunction due to NICM: mild nonobstructive CAD on [facility] 12/2018. CM out of proportion of CAD. PYP scan and myeloma panel completed and were not suggestive of cardiac amyloidosis. S/p St Jude ICD. ECHO 07/28/2017 EF 20-25%. RV mildly dilated. Echo 11/17/18 EF 20-25% RV dilated and severely HK with septal flattening. Presented to ED 10/12 w/ NYHA IV symptoms and markedly volume overload w/ 3+ bilateral LEE, left basilar rales and elevated JVD to level of ear. Admit Wt up 10+ lb from home baseline. BNP 2600 on admit.  -Poor initial response to high dose IV diuretics w/ increase in SCr from  2.76>>3.30, prompting initiation of inotropic support, started on milrinone.  -Despite inotrope, diuresis is slow and wt continues to trend up. SCr slightly improved from 3.03>>2.97.  Plan RHC today and central line placement (will monitor Co-ox and CVP).  -continue unna boots  - ? blocker (coreg) on hold due to low output -Off digoxin due to AKI -Hold bidil for now given soft BP -Off spiro and ACE/ARB due to CKD/AKI -With CKD 4, biventricular HF and morbid obesity, advanced options are quite limited. -He continues to deteriorate with biventricular dysfunction/PAH and progressive CKD. Only real option seems to be consideration of heart kidney transplant. This was previously discussed with him and he would consider but age and weight may be prohibitive. (BMI 38). Needs palliative care consult to discus GOC.   2. CKD stage 3 - Baseline creatinine ~1.9. Recent Cr 2.8 -> 2.4-> 2.7-> 3.4 -> 2.97->2.76 (admit) - SCr increased after doses of IV Lasix>>3.30. Suspect cardiorenal syndrome - Slight improvement after addition of milrinone. SCr down from 3.10>>3.03>>2.97 -  Continue to monitor closely w/ daily BMPs  3. CAD - LHC8/20with LAD 20%50% distal left circ and 20% mid RCA stenosis. No significant disease in left main or LAD. - No s/s ischemia. Hs Trop 284 but likely demand ischemia in the setting of a/c CHF and abnormal renal function.   - Continue statin. - Hold ? blocker due to soft BP/ concern for low output - No ASA due to chronic coumadin  4. PAF  -s/p recent DC-CV for AFL. In NSR today w/ frequent ventricular ectopy. - changed from PO to IV amiodarone 10/14  - On coumadin, followed by coumadin clinic on [place].  - INR therapeutic at 2.9. Pharmacy to assist w/ dosing   6. VT -Has St Jude ICD.  -Had VT episode (rate 190 bpm) w/ delivery of shock on 02/07/19  -Frequent NSVT on tele, up to 20 beats. Completely asymptomatic.  -Started on IV amiodarone 10/14  w/ improvement, NSVT less frequent -Continue to monitor while on milrinone  -Keep K >4.0 and Mg > 2.0  -K 3.9 this am. Will supp K. Mg 2.4 today.   7. Pulmonary HTN - On home O2 (2L).  -suspect primarily WHO Group 2 from Left Heart Disease. Also probable component of WHO Group 3 (OSA). Had negative V/Q scan ruling out CTEPH.  -Plan repeat RHC today   8. Hypokalemia: K 3.4 today after getting IV lasix  -will supplement w/ K-dur -f/u BMP in the AM   Length of Stay: 3  Brittainy Simmons, PA-C  02/16/2019, 7:16 AM  Advanced Heart Failure Team Pager [phone] (M-F; [place])  Please contact [facility] for night-coverage after hours (4p -7a ) and weekends on [email or web address]   Patient seen and examined with the above-signed Advanced Practice Provider and/or Housestaff. I personally reviewed laboratory data, imaging studies and relevant notes. I independently examined the patient and formulated the important aspects of the plan. I have edited the note to reflect any of my changes or salient points. I have personally discussed the plan with the patient and/or family.  Says he feels  better. But minimal urine output. Weight up slightly despite milrinone support and IV lasix. Creatinine mildly improved on milrinone. Continues with NCVT but not as frequent with amio.   Will plan RHC today and leave central line in for further monitoring. Suspect we will need to increase lasix dosing. Options are increasingly limited. HD may be only way to control volume but will be hard to tolerate with low EF.  Supp electrolytes.   Glori Bickers, MD  9:20 AM

## 2019-02-16 NOTE — Care Management Important Message (Signed)
Important Message  Patient Details  Name: LONEY DOMINGO MRN: [id number] Date of Birth: 1955/08/08   Medicare Important Message Given:  Yes     Shelda Altes 02/16/2019, 2:08 PM

## 2019-02-16 NOTE — Interval H&P Note (Signed)
History and Physical Interval Note:  02/16/2019 9:22 AM  [name]  has presented today for surgery, with the diagnosis of heart failure.  The various methods of treatment have been discussed with the patient and family. After consideration of risks, benefits and other options for treatment, the patient has consented to  Procedure(s): RIGHT HEART CATH (N/A) and central line insertion as a surgical intervention.  The patient's history has been reviewed, patient examined, no change in status, stable for surgery.  I have reviewed the patient's chart and labs.  Questions were answered to the patient's satisfaction.     Daniel Bensimhon

## 2019-02-16 NOTE — Progress Notes (Signed)
ANTICOAGULATION CONSULT NOTE - Follow Up Consult  Pharmacy Consult for Warfarin Indication: atrial fibrillation  No Known Allergies  Patient Measurements: Height: 5\' 10"  (177.8 cm) Weight: 276 lb 14.4 oz (125.6 kg) IBW/kg (Calculated) : 73  Vital Signs: Temp: (P) 97.8 F (36.6 C) (10/15 0700) Temp Source: (P) Oral (10/15 0700) BP: (P) 119/92 (10/15 0700) Pulse Rate: (P) 81 (10/15 0700)  Labs: Recent Labs    02/13/19 1109  02/13/19 1556 02/14/19 0408 02/14/19 1645 02/15/19 0540 02/16/19 0315  HGB 14.1  --   --  12.5*  --  12.4* 12.4*  HCT 42.0  --   --  39.1  --  37.0* 37.7*  PLT 187  --   --  163  --  169 171  LABPROT  --    < > 25.8* 26.0*  --  27.9* 29.7*  INR  --    < > 2.4* 2.4*  --  2.7* 2.9*  CREATININE 2.76*  --   --  3.30* 3.10* 3.03* 2.97*  TROPONINIHS 284*  --  301*  --   --   --   --    < > = values in this interval not displayed.    Estimated Creatinine Clearance: 34.3 mL/min (A) (by C-G formula based on SCr of 2.97 mg/dL (H)).   Medical History: Past Medical History:  Diagnosis Date  . CAD (coronary artery disease)    a. Initial nonobst by cath 2009. b. Cath 01/2013 in setting of VT storm: obstructive distal Cx disease (small and terminates in the AV groove, unlikely to cause significant ischemia or electrical instability), nonobstructive RCA disease, EF 15-20%.   . Cerebrovascular accident [facility])    a. 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 Chronic systolic CHF (congestive heart failure) ([place])    a. Likely NICM (out of proportion to CAD). b. 2009 - EF 25-30% by echo, 01/2013: 15-20% by cath. c. 11/2014 Echo: EF 35-40%, Gr1 DD, mild MR, mod TR, PASP 68mmHg.  . CKD (chronic kidney disease), stage II   . Dyslipidemia   . Gout   . HTN (hypertension)   . Hypokalemia   . ICD (implantable cardiac defibrillator) in place   . Insulin dependent diabetes mellitus   . Lipoma   . Nonischemic cardiomyopathy ([place])    a.  11/2014 Echo: EF 35-40%, Gr1 DD, mild MR, mod  TR, PASP 2mmHg.  . OSA (obstructive sleep apnea)    does not wear cpap  . PAF (paroxysmal atrial fibrillation) ([place])    a. Noted 05/2008 by EKG;  b. CHA2DS2VASc = 5-6-->coumadin.  . Paroxysmal VT ([place])    a. s/p St. Jude ICD 2007. b. H/o paroxysmal VT/VF including VT storm 12/2012 admission prompting amio initiation;  c. 01/2013 ICD upgrade SJM [id number] Ellipse VR single lead ICD.  [name] Pulmonary HTN ([place])    a. Mild by cath 01/2013.    Medications:  Scheduled:  . atorvastatin  80 mg Oral q1800  . sodium chloride flush  3 mL Intravenous Q12H  . sodium chloride flush  3 mL Intravenous Q12H  . Warfarin - Pharmacist Dosing Inpatient   Does not apply q1800   Infusions:  . sodium chloride    . sodium chloride    . sodium chloride 10 mL/hr at 02/16/19 0551  . amiodarone 30 mg/hr (02/16/19 0400)  . furosemide 120 mg (02/15/19 2131)  . milrinone 0.125 mcg/kg/min (02/16/19 0400)    Assessment: Pt is a 63 y/o male with PMH of CHF (  EF 20-25%), prior VT s/p St Jude ICD, CAD, CVA, OSA on CPAP, pulmonary HTN, HTN, DM, CKDs stage III, and PAF on coumadin PTA. Pt presents to the ED with volume overload diuresing with IV furosemide. Pharmacy has been consulted to dose warfarin  Patient's INR today is 2.9 (up from 2.7). CBC is stable with Hgb ~12, plt WNL. No s/sx of bleeding per notes.  Patient is s/p RHC this morning.   His new outpatient warfarin regimen is 5mg  daily, except 2.5 mg on fridays.    Goal of Therapy:  INR 2-3 Monitor platelets by anticoagulation protocol: Yes   Plan:  Give warfarin 2.5 mg x 1 tonight Monitor for signs/symptoms of bleeding Check daily INR, CBC  Erin Hearing PharmD., BCPS Clinical Pharmacist 02/16/2019 7:47 AM   Please check AMION for all [place] phone numbers After 10:00 PM, call [place] [phone]

## 2019-02-16 NOTE — H&P (View-Only) (Signed)
Advanced Heart Failure Rounding Note  PCP-Cardiologist: Minus Breeding, MD  [facility]: Dr. Haroldine Laws   Subjective:    Wt continues to trend up, 274>>275>>276lb . Only 500 cc UOP documented yesterday but pt notes subjective improvement.   No complaints this morning. Denies resting dyspnea. Asymptomatic w/ NSVT. States he is starting to urinate more.     Objective:   Weight Range: 125.6 kg Body mass index is 39.73 kg/m.   Vital Signs:   Temp:  [97.6 F (36.4 C)-98.3 F (36.8 C)] 97.9 F (36.6 C) (10/15 0658) Pulse Rate:  [76-115] 86 (10/15 0658) Resp:  [16-19] 16 (10/15 0658) BP: (92-122)/(58-91) 122/91 (10/15 0658) SpO2:  [91 %-97 %] 94 % (10/15 0658) Weight:  [125.6 kg] 125.6 kg (10/15 0658) Last BM Date: 02/13/19  Weight change: Filed Weights   02/14/19 0340 02/15/19 0326 02/16/19 0658  Weight: 124.3 kg 125.1 kg 125.6 kg    Intake/Output:   Intake/Output Summary (Last 24 hours) at 02/16/2019 0716 Last data filed at 02/16/2019 0400 Gross per 24 hour  Intake 2564.35 ml  Output 500 ml  Net 2064.35 ml      Physical Exam    PHYSICAL EXAM: General:  Obese male. Well appearing. No respiratory difficulty HEENT: normal anicteric Neck: supple. Elevated JVD above ear. Carotids 2+ bilat; no bruits. No lymphadenopathy or thyromegaly appreciated. Cor: PMI nondisplaced. Regular rate & rhythm. +s3 Lungs: faint crackles at the bases  No wheeze Abdomen: obese soft, nontender, nondistended. No hepatosplenomegaly. No bruits or masses. Good bowel sounds. Extremities: no cyanosis, clubbing, rash, 3+ bilateral edema, bilateral unna boots  Neuro: alert & oriented x 3, cranial nerves grossly intact. moves all 4 extremities w/o difficulty. Affect pleasant   Telemetry   NSR low 80s, still w/ NSVT but less frequent   EKG    N/A  Labs    CBC Recent Labs    02/15/19 0540 02/16/19 0315  WBC 7.7 8.1  HGB 12.4* 12.4*  HCT 37.0* 37.7*  MCV 98.1 98.2  PLT 169 371    Basic Metabolic Panel Recent Labs    02/15/19 0540 02/16/19 0315  NA 140 140  K 3.5 3.9  CL 99 98  CO2 29 28  GLUCOSE 115* 100*  BUN 61* 62*  CREATININE 3.03* 2.97*  CALCIUM 8.9 8.9  MG 2.4 2.4   Liver Function Tests Recent Labs    02/13/19 1556  AST 225*  ALT 133*  ALKPHOS 217*  BILITOT 2.8*  PROT 6.8  ALBUMIN 3.0*   No results for input(s): LIPASE, AMYLASE in the last 72 hours. Cardiac Enzymes No results for input(s): CKTOTAL, CKMB, CKMBINDEX, TROPONINI in the last 72 hours.  BNP: BNP (last 3 results) Recent Labs    07/19/18 1106 01/02/19 1552 02/13/19 1114  BNP 1,608.5* 2,154.7* 2,637.3*    ProBNP (last 3 results) Recent Labs    11/01/18 1229  PROBNP 2,158.0*     D-Dimer No results for input(s): DDIMER in the last 72 hours. Hemoglobin A1C No results for input(s): HGBA1C in the last 72 hours. Fasting Lipid Panel No results for input(s): CHOL, HDL, LDLCALC, TRIG, CHOLHDL, LDLDIRECT in the last 72 hours. Thyroid Function Tests No results for input(s): TSH, T4TOTAL, T3FREE, THYROIDAB in the last 72 hours.  Invalid input(s): FREET3  Other results:   Imaging    No results found.   Medications:     Scheduled Medications: . atorvastatin  80 mg Oral q1800  . sodium chloride flush  3 mL Intravenous  Q12H  . sodium chloride flush  3 mL Intravenous Q12H  . Warfarin - Pharmacist Dosing Inpatient   Does not apply q1800    Infusions: . sodium chloride    . sodium chloride    . sodium chloride 10 mL/hr at 02/16/19 0551  . amiodarone 30 mg/hr (02/16/19 0400)  . furosemide 120 mg (02/15/19 2131)  . milrinone 0.125 mcg/kg/min (02/16/19 0400)    PRN Medications: sodium chloride, sodium chloride, acetaminophen, sodium chloride flush, sodium chloride flush    Patient Profile   63 y/o male with severe systolic HF with biventricular dysfunction due to NICM and advanced CKD. Recently hospitalized with low output HF. Now returns with progressive  NYHA IV symptoms with marked fluid overload not responding to titration of oral diuretic regimen. With CKD 4, biventricular HF and morbid obesity advanced options are quite limited.  Assessment/Plan    1. Acute on Chronic Systolic HF with biventricular dysfunction due to NICM: mild nonobstructive CAD on [facility] 12/2018. CM out of proportion of CAD. PYP scan and myeloma panel completed and were not suggestive of cardiac amyloidosis. S/p St Jude ICD. ECHO 07/28/2017 EF 20-25%. RV mildly dilated. Echo 11/17/18 EF 20-25% RV dilated and severely HK with septal flattening. Presented to ED 10/12 w/ NYHA IV symptoms and markedly volume overload w/ 3+ bilateral LEE, left basilar rales and elevated JVD to level of ear. Admit Wt up 10+ lb from home baseline. BNP 2600 on admit.  -Poor initial response to high dose IV diuretics w/ increase in SCr from  2.76>>3.30, prompting initiation of inotropic support, started on milrinone.  -Despite inotrope, diuresis is slow and wt continues to trend up. SCr slightly improved from 3.03>>2.97.  Plan RHC today and central line placement (will monitor Co-ox and CVP).  -continue unna boots  - ? blocker (coreg) on hold due to low output -Off digoxin due to AKI -Hold bidil for now given soft BP -Off spiro and ACE/ARB due to CKD/AKI -With CKD 4, biventricular HF and morbid obesity, advanced options are quite limited. -He continues to deteriorate with biventricular dysfunction/PAH and progressive CKD. Only real option seems to be consideration of heart kidney transplant. This was previously discussed with him and he would consider but age and weight may be prohibitive. (BMI 38). Needs palliative care consult to discus GOC.   2. CKD stage 3 - Baseline creatinine ~1.9. Recent Cr 2.8 -> 2.4-> 2.7-> 3.4 -> 2.97->2.76 (admit) - SCr increased after doses of IV Lasix>>3.30. Suspect cardiorenal syndrome - Slight improvement after addition of milrinone. SCr down from 3.10>>3.03>>2.97 -  Continue to monitor closely w/ daily BMPs  3. CAD - LHC8/20with LAD 20%50% distal left circ and 20% mid RCA stenosis. No significant disease in left main or LAD. - No s/s ischemia. Hs Trop 284 but likely demand ischemia in the setting of a/c CHF and abnormal renal function.   - Continue statin. - Hold ? blocker due to soft BP/ concern for low output - No ASA due to chronic coumadin  4. PAF  -s/p recent DC-CV for AFL. In NSR today w/ frequent ventricular ectopy. - changed from PO to IV amiodarone 10/14  - On coumadin, followed by coumadin clinic on [place].  - INR therapeutic at 2.9. Pharmacy to assist w/ dosing   6. VT -Has St Jude ICD.  -Had VT episode (rate 190 bpm) w/ delivery of shock on 02/07/19  -Frequent NSVT on tele, up to 20 beats. Completely asymptomatic.  -Started on IV amiodarone 10/14  w/ improvement, NSVT less frequent -Continue to monitor while on milrinone  -Keep K >4.0 and Mg > 2.0  -K 3.9 this am. Will supp K. Mg 2.4 today.   7. Pulmonary HTN - On home O2 (2L).  -suspect primarily WHO Group 2 from Left Heart Disease. Also probable component of WHO Group 3 (OSA). Had negative V/Q scan ruling out CTEPH.  -Plan repeat RHC today   8. Hypokalemia: K 3.4 today after getting IV lasix  -will supplement w/ K-dur -f/u BMP in the AM   Length of Stay: 3  Brittainy Simmons, PA-C  02/16/2019, 7:16 AM  Advanced Heart Failure Team Pager [phone] (M-F; [place])  Please contact [facility] for night-coverage after hours (4p -7a ) and weekends on [email or web address]   Patient seen and examined with the above-signed Advanced Practice Provider and/or Housestaff. I personally reviewed laboratory data, imaging studies and relevant notes. I independently examined the patient and formulated the important aspects of the plan. I have edited the note to reflect any of my changes or salient points. I have personally discussed the plan with the patient and/or family.  Says he feels  better. But minimal urine output. Weight up slightly despite milrinone support and IV lasix. Creatinine mildly improved on milrinone. Continues with NCVT but not as frequent with amio.   Will plan RHC today and leave central line in for further monitoring. Suspect we will need to increase lasix dosing. Options are increasingly limited. HD may be only way to control volume but will be hard to tolerate with low EF.  Supp electrolytes.   Glori Bickers, MD  9:20 AM

## 2019-02-17 ENCOUNTER — Telehealth (HOSPITAL_COMMUNITY): Payer: Self-pay | Admitting: Pharmacy Technician

## 2019-02-17 ENCOUNTER — Encounter (HOSPITAL_COMMUNITY): Payer: Self-pay | Admitting: Internal Medicine

## 2019-02-17 DIAGNOSIS — I5043 Acute on chronic combined systolic (congestive) and diastolic (congestive) heart failure: Secondary | ICD-10-CM | POA: Diagnosis not present

## 2019-02-17 DIAGNOSIS — I472 Ventricular tachycardia: Secondary | ICD-10-CM | POA: Diagnosis not present

## 2019-02-17 LAB — CBC
HCT: 37.7 % — ABNORMAL LOW (ref 39.0–52.0)
Hemoglobin: 11.9 g/dL — ABNORMAL LOW (ref 13.0–17.0)
MCH: 31.6 pg (ref 26.0–34.0)
MCHC: 31.6 g/dL (ref 30.0–36.0)
MCV: 100.3 fL — ABNORMAL HIGH (ref 80.0–100.0)
Platelets: 158 10*3/uL (ref 150–400)
RBC: 3.76 MIL/uL — ABNORMAL LOW (ref 4.22–5.81)
RDW: 16.8 % — ABNORMAL HIGH (ref 11.5–15.5)
WBC: 8.5 10*3/uL (ref 4.0–10.5)
nRBC: 0 % (ref 0.0–0.2)

## 2019-02-17 LAB — COOXEMETRY PANEL
Carboxyhemoglobin: 1.3 % (ref 0.5–1.5)
Methemoglobin: 0.5 % (ref 0.0–1.5)
O2 Saturation: 66.3 %
Total hemoglobin: 12 g/dL (ref 12.0–16.0)

## 2019-02-17 LAB — BASIC METABOLIC PANEL
Anion gap: 10 (ref 5–15)
BUN: 61 mg/dL — ABNORMAL HIGH (ref 8–23)
CO2: 28 mmol/L (ref 22–32)
Calcium: 8.8 mg/dL — ABNORMAL LOW (ref 8.9–10.3)
Chloride: 101 mmol/L (ref 98–111)
Creatinine, Ser: 2.88 mg/dL — ABNORMAL HIGH (ref 0.61–1.24)
GFR calc Af Amer: 26 mL/min — ABNORMAL LOW (ref >60)
GFR calc non Af Amer: 22 mL/min — ABNORMAL LOW (ref >60)
Glucose, Bld: 115 mg/dL — ABNORMAL HIGH (ref 70–99)
Potassium: 4.2 mmol/L (ref 3.5–5.1)
Sodium: 139 mmol/L (ref 135–145)

## 2019-02-17 LAB — GLUCOSE, CAPILLARY
Glucose-Capillary: 118 mg/dL — ABNORMAL HIGH (ref 70–99)
Glucose-Capillary: 105 mg/dL — ABNORMAL HIGH (ref 70–99)
Glucose-Capillary: 98 mg/dL (ref 70–99)
Glucose-Capillary: 147 mg/dL — ABNORMAL HIGH (ref 70–99)

## 2019-02-17 LAB — PROTIME-INR
INR: 2.9 — ABNORMAL HIGH (ref 0.8–1.2)
Prothrombin Time: 30.2 seconds — ABNORMAL HIGH (ref 11.4–15.2)

## 2019-02-17 LAB — MAGNESIUM: Magnesium: 2.3 mg/dL (ref 1.7–2.4)

## 2019-02-17 MED ORDER — AMIODARONE HCL 200 MG PO TABS
200.0000 mg | ORAL_TABLET | Freq: Two times a day (BID) | ORAL | Status: DC
  Administered 2019-02-17 – 2019-03-02 (×27): 200 mg via ORAL
  Filled 2019-02-17 (×27): qty 1

## 2019-02-17 MED ORDER — ACETAZOLAMIDE 250 MG PO TABS
250.0000 mg | ORAL_TABLET | Freq: Three times a day (TID) | ORAL | Status: DC
  Administered 2019-02-17 – 2019-02-20 (×10): 250 mg via ORAL
  Filled 2019-02-17 (×10): qty 1

## 2019-02-17 MED ORDER — POTASSIUM CHLORIDE CRYS ER 20 MEQ PO TBCR
40.0000 meq | EXTENDED_RELEASE_TABLET | Freq: Every day | ORAL | Status: DC
  Administered 2019-02-17 – 2019-02-21 (×5): 40 meq via ORAL
  Filled 2019-02-17 (×5): qty 2

## 2019-02-17 MED ORDER — FUROSEMIDE 10 MG/ML IJ SOLN
20.0000 mg/h | INTRAVENOUS | Status: DC
  Administered 2019-02-17 – 2019-02-20 (×6): 20 mg/h via INTRAVENOUS
  Filled 2019-02-17: qty 25
  Filled 2019-02-17 (×2): qty 21
  Filled 2019-02-17 (×2): qty 25
  Filled 2019-02-17: qty 5
  Filled 2019-02-17: qty 21
  Filled 2019-02-17: qty 25

## 2019-02-17 MED ORDER — WARFARIN SODIUM 2.5 MG PO TABS
2.5000 mg | ORAL_TABLET | Freq: Once | ORAL | Status: AC
  Administered 2019-02-17: 2.5 mg via ORAL
  Filled 2019-02-17: qty 1

## 2019-02-17 NOTE — Progress Notes (Signed)
ANTICOAGULATION CONSULT NOTE - Follow Up Consult  Pharmacy Consult for Warfarin Indication: atrial fibrillation  No Known Allergies  Patient Measurements: Height: 5\' 10"  (177.8 cm) Weight: 280 lb (127 kg) IBW/kg (Calculated) : 73  Vital Signs: Temp: 98.7 F (37.1 C) (10/16 0817) Temp Source: Oral (10/16 0817) BP: 118/82 (10/16 0817) Pulse Rate: 82 (10/16 0817)  Labs: Recent Labs    02/15/19 0540 02/16/19 0315 02/16/19 1021 02/16/19 1022 02/17/19 0444  HGB 12.4* 12.4* 13.3 12.6* 11.9*  HCT 37.0* 37.7* 39.0 37.0* 37.7*  PLT 169 171  --   --  158  LABPROT 27.9* 29.7*  --   --  30.2*  INR 2.7* 2.9*  --   --  2.9*  CREATININE 3.03* 2.97*  --   --  2.88*    Estimated Creatinine Clearance: 35.6 mL/min (A) (by C-G formula based on SCr of 2.88 mg/dL (H)).   Medical History: Past Medical History:  Diagnosis Date  . CAD (coronary artery disease)    a. Initial nonobst by cath 2009. b. Cath 01/2013 in setting of VT storm: obstructive distal Cx disease (small and terminates in the AV groove, unlikely to cause significant ischemia or electrical instability), nonobstructive RCA disease, EF 15-20%.   . Cerebrovascular accident [facility])    a. 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 Chronic systolic CHF (congestive heart failure) ([place])    a. Likely NICM (out of proportion to CAD). b. 2009 - EF 25-30% by echo, 01/2013: 15-20% by cath. c. 11/2014 Echo: EF 35-40%, Gr1 DD, mild MR, mod TR, PASP 76mmHg.  . CKD (chronic kidney disease), stage II   . Dyslipidemia   . Gout   . HTN (hypertension)   . Hypokalemia   . ICD (implantable cardiac defibrillator) in place   . Insulin dependent diabetes mellitus   . Lipoma   . Nonischemic cardiomyopathy ([place])    a.  11/2014 Echo: EF 35-40%, Gr1 DD, mild MR, mod TR, PASP 43mmHg.  . OSA (obstructive sleep apnea)    does not wear cpap  . PAF (paroxysmal atrial fibrillation) ([place])    a. Noted 05/2008 by EKG;  b. CHA2DS2VASc = 5-6-->coumadin.  . Paroxysmal  VT ([place])    a. s/p St. Jude ICD 2007. b. H/o paroxysmal VT/VF including VT storm 12/2012 admission prompting amio initiation;  c. 01/2013 ICD upgrade SJM 1411-36Q Ellipse VR single lead ICD.  [name] Pulmonary HTN ([place])    a. Mild by cath 01/2013.    Medications:  Scheduled:  . acetaZOLAMIDE  250 mg Oral TID  . atorvastatin  80 mg Oral q1800  . Chlorhexidine Gluconate Cloth  6 each Topical Daily  . potassium chloride  40 mEq Oral Daily  . sildenafil  20 mg Oral TID  . sodium chloride flush  3 mL Intravenous Q12H  . sodium chloride flush  3 mL Intravenous Q12H  . sodium chloride flush  3 mL Intravenous Q12H  . Warfarin - Pharmacist Dosing Inpatient   Does not apply q1800   Infusions:  . sodium chloride    . sodium chloride    . amiodarone 30 mg/hr (02/16/19 2340)  . furosemide (LASIX) infusion      Assessment: Pt is a 63 y/o male with PMH of CHF (EF 20-25%), prior VT s/p St Jude ICD, CAD, CVA, OSA on CPAP, pulmonary HTN, HTN, DM, CKDs stage III, and PAF on coumadin PTA. Pt presents to the ED with volume overload diuresing with IV furosemide. Pharmacy has been consulted to  dose warfarin  Patient's INR today is unchanged at 2.9. CBC is stable with Hgb ~12, plt WNL. No s/sx of bleeding per notes.  Patient is s/p RHC 10/15.   His new outpatient warfarin regimen is 5mg  daily, except 2.5 mg on fridays.    Goal of Therapy:  INR 2-3 Monitor platelets by anticoagulation protocol: Yes   Plan:  Give warfarin 2.5 mg x 1 tonight Monitor for signs/symptoms of bleeding Check daily INR, CBC  Erin Hearing PharmD., BCPS Clinical Pharmacist 02/17/2019 8:43 AM  Please check AMION for all [place] phone numbers After 10:00 PM, call [place] [phone]

## 2019-02-17 NOTE — Progress Notes (Addendum)
Advanced Heart Failure Rounding Note  PCP-Cardiologist: Minus Breeding, MD  [facility]: Dr. Haroldine Laws   Subjective:    RHC yesterday on milrinone 0.125  RA = 16 RV = 63/18 PA = 66/29 (47) PCW = 11 Fick cardiac output/index = 5.6/2.3 Thermo CO/CI = 4.6/1.9 PVR = 6.5 (Fick) 7.9 (therm) FA sat = 96% PA sat = 62%, 62%  Hemodynamics most c/w with PAH and RV failure. Left sided filling pressures are ok.   Yesterday milrinone and lasix gtt stopped due to cath results and very frequent NSVT. Started on IV amio. Weight trending up 3 pounds.   Complaining of increased leg edema. Denies SOB.    Objective:   Weight Range: 127 kg Body mass index is 40.18 kg/m.   Vital Signs:   Temp:  [97.7 F (36.5 C)-98.7 F (37.1 C)] 98.7 F (37.1 C) (10/16 0817) Pulse Rate:  [44-95] 82 (10/16 0817) Resp:  [12-21] 17 (10/16 0817) BP: (98-134)/(68-95) 118/82 (10/16 0817) SpO2:  [92 %-99 %] 95 % (10/16 0817) Weight:  [127 kg] 127 kg (10/16 0459) Last BM Date: 02/15/19  Weight change: Filed Weights   02/15/19 0326 02/16/19 0658 02/17/19 0459  Weight: 125.1 kg 125.6 kg 127 kg    Intake/Output:   Intake/Output Summary (Last 24 hours) at 02/17/2019 0902 Last data filed at 02/17/2019 0600 Gross per 24 hour  Intake 1674 ml  Output 200 ml  Net 1474 ml      Physical Exam    General: Sitting on the side of the bed. . No resp difficulty HEENT: normal Neck: supple. JVD 10-11 . Carotids 2+ bilat; no bruits. No lymphadenopathy or thryomegaly appreciated. Cor: PMI nondisplaced. Regular rate & rhythm. No rubs, or murmurs. + S3  Lungs: clear Abdomen: soft, nontender, nondistended. No hepatosplenomegaly. No bruits or masses. Good bowel sounds. Extremities: no cyanosis, clubbing, rash, R and LLE 3+ edema unna boots Neuro: alert & orientedx3, cranial nerves grossly intact. moves all 4 extremities w/o difficulty. Affect pleasant   Telemetry   NSR PVCs NSVT   EKG    N/A  Labs    CBC  Recent Labs    02/16/19 0315  02/16/19 1022 02/17/19 0444  WBC 8.1  --   --  8.5  HGB 12.4*   < > 12.6* 11.9*  HCT 37.7*   < > 37.0* 37.7*  MCV 98.2  --   --  100.3*  PLT 171  --   --  158   < > = values in this interval not displayed.   Basic Metabolic Panel Recent Labs    02/16/19 0315  02/16/19 1022 02/17/19 0444  NA 140   < > 143 139  K 3.9   < > 3.4* 4.2  CL 98  --   --  101  CO2 28  --   --  28  GLUCOSE 100*  --   --  115*  BUN 62*  --   --  61*  CREATININE 2.97*  --   --  2.88*  CALCIUM 8.9  --   --  8.8*  MG 2.4  --   --  2.3   < > = values in this interval not displayed.   Liver Function Tests No results for input(s): AST, ALT, ALKPHOS, BILITOT, PROT, ALBUMIN in the last 72 hours. No results for input(s): LIPASE, AMYLASE in the last 72 hours. Cardiac Enzymes No results for input(s): CKTOTAL, CKMB, CKMBINDEX, TROPONINI in the last 72 hours.  BNP:  BNP (last 3 results) Recent Labs    07/19/18 1106 01/02/19 1552 02/13/19 1114  BNP 1,608.5* 2,154.7* 2,637.3*    ProBNP (last 3 results) Recent Labs    11/01/18 1229  PROBNP 2,158.0*     D-Dimer No results for input(s): DDIMER in the last 72 hours. Hemoglobin A1C No results for input(s): HGBA1C in the last 72 hours. Fasting Lipid Panel No results for input(s): CHOL, HDL, LDLCALC, TRIG, CHOLHDL, LDLDIRECT in the last 72 hours. Thyroid Function Tests No results for input(s): TSH, T4TOTAL, T3FREE, THYROIDAB in the last 72 hours.  Invalid input(s): FREET3  Other results:   Imaging    No results found.   Medications:     Scheduled Medications: . acetaZOLAMIDE  250 mg Oral TID  . atorvastatin  80 mg Oral q1800  . Chlorhexidine Gluconate Cloth  6 each Topical Daily  . potassium chloride  40 mEq Oral Daily  . sildenafil  20 mg Oral TID  . sodium chloride flush  3 mL Intravenous Q12H  . sodium chloride flush  3 mL Intravenous Q12H  . sodium chloride flush  3 mL Intravenous Q12H  . Warfarin  - Pharmacist Dosing Inpatient   Does not apply q1800    Infusions: . sodium chloride    . sodium chloride    . amiodarone 30 mg/hr (02/16/19 2340)  . furosemide (LASIX) infusion      PRN Medications: sodium chloride, sodium chloride, acetaminophen, ondansetron (ZOFRAN) IV, sodium chloride flush, sodium chloride flush    Patient Profile   63 y/o male with severe systolic HF with biventricular dysfunction due to NICM and advanced CKD. Recently hospitalized with low output HF. Now returns with progressive NYHA IV symptoms with marked fluid overload not responding to titration of oral diuretic regimen. With CKD 4, biventricular HF and morbid obesity advanced options are quite limited.  Assessment/Plan    1. Acute on Chronic Systolic HF with biventricular dysfunction due to NICM: mild nonobstructive CAD on [facility] 12/2018. CM out of proportion of CAD. PYP scan and myeloma panel completed and were not suggestive of cardiac amyloidosis. S/p St Jude ICD. ECHO 07/28/2017 EF 20-25%. RV mildly dilated. Echo 11/17/18 EF 20-25% RV dilated and severely HK with septal flattening. Presented to ED 10/12 w/ NYHA IV symptoms and markedly volume overload w/ 3+ bilateral LEE, left basilar rales and elevated JVD to level of ear. Admit Wt up 10+ lb from home baseline. BNP 2600 on admit.  -Poor initial response to high dose IV diuretics w/ increase in SCr from  2.76>>3.30, prompting initiation of inotropic support, started on milrinone.  -Plan RHC 10/15 with R sided HF and PAH.  - Volume overloaded today with significant lower extremity edema. Start lasix drip at 20 mg per hour and add diamox 250 mg twice a day.  -continue unna boots  - ? blocker (coreg) on hold due to low output -Off digoxin due to AKI -Hold bidil for now given soft BP -Off spiro and ACE/ARB due to CKD/AKI -With CKD 4, biventricular HF and morbid obesity, advanced options are quite limited. -He continues to deteriorate with biventricular  dysfunction/PAH and progressive CKD. Only real option seems to be consideration of heart kidney transplant. This was previously discussed with him and he would consider but age and weight may be prohibitive. (BMI 38).  Needs palliative care consult to discus GOC.   2. CKD stage 3 - Baseline creatinine ~1.9. Recent Cr 2.8 -> 2.4-> 2.7-> 3.4 -> 2.97->2.76->2.8 . Follow bmet daily.  Suspect cardiorenal syndrome  3. CAD - LHC8/20with LAD 20%50% distal left circ and 20% mid RCA stenosis. No significant disease in left main or LAD. - No s/s ischemia. Hs Trop 284 but likely demand ischemia in the setting of a/c CHF and abnormal renal function.   - Continue statin. - Hold ? blocker due to soft BP/ concern for low output - No ASA due to chronic coumadin  4. PAF  -s/p recent DC-CV for AFL. -stop IV amio. Start amio 200 mg bid.    - On coumadin, followed by coumadin clinic on [place].  - INR therapeutic at 2.9. Pharmacy to assist w/ dosing   6. VT -Has St Jude ICD.  -Had VT episode (rate 190 bpm) w/ delivery of shock on 02/07/19  -Started on IV amiodarone 10/14 w/ improvement, NSVT less frequent off milrinone.  - Stop amio drip. Start amio 200 mg po twice a day.  -Keep K >4.0 and Mg > 2.0 . Stable today.   7. Pulmonary HTN - On home O2 (2L).  -suspect primarily WHO Group 2 from Left Heart Disease. Also probable component of WHO Group 3 (OSA). Had negative V/Q scan ruling out CTEPH.  - RHC PVR 6.5 . Started on sildenafil 20 mg three times a day.  - Needs to PA for sildenafil. We will work on this today.    8. Hypokalemia: stable today.    Length of Stay: 4  Amy Clegg, NP  02/17/2019, 9:02 AM  Advanced Heart Failure Team Pager [phone] (M-F; [place])  Please contact [facility] for night-coverage after hours (4p -7a ) and weekends on [email or web address]  Patient seen and examined with the above-signed Advanced Practice Provider and/or Housestaff. I personally reviewed laboratory  data, imaging studies and relevant notes. I independently examined the patient and formulated the important aspects of the plan. I have edited the note to reflect any of my changes or salient points. I have personally discussed the plan with the patient and/or family.  Very difficult situation. RHC results from yesterday reviewed with him. Despite severe LV dysfunction (EF 20-25%). Main issue appears to be PAH and RV failure in setting of CKD 4. Milrinone stopped due to extensive ventricular ectopy. On exam still with 3+ edema. Will resume lasix gtt and add diamox. May need HD in the near future to help control volume status if he can tolerate. Sildenafil started. Discussed with PharmD personally - checking on coverage. Will likely need to stay in house over the weekend to get some fluid off as he is ~20 pounds up.   Glori Bickers, MD  9:37 AM

## 2019-02-17 NOTE — Discharge Instructions (Signed)

## 2019-02-17 NOTE — Care Management (Signed)
1031 02-17-19 1030 02-17-19  [place]: [employer] # [phone] Prior Approval needed for medication- please call [phone]). Thanks Graves-Bigelow, Ocie Cornfield RN, BSN Case Manager [phone]

## 2019-02-17 NOTE — Telephone Encounter (Signed)
Patient Advocate Encounter   Received notification from [employer] Part D that prior authorization for Sildenafil 20mg  is required.   PA submitted on CoverMyMeds Key [id number] Status is pending   Will continue to follow.  Charlann Boxer, CPhT

## 2019-02-18 DIAGNOSIS — I5043 Acute on chronic combined systolic (congestive) and diastolic (congestive) heart failure: Secondary | ICD-10-CM | POA: Diagnosis not present

## 2019-02-18 LAB — GLUCOSE, CAPILLARY
Glucose-Capillary: 118 mg/dL — ABNORMAL HIGH (ref 70–99)
Glucose-Capillary: 139 mg/dL — ABNORMAL HIGH (ref 70–99)
Glucose-Capillary: 142 mg/dL — ABNORMAL HIGH (ref 70–99)
Glucose-Capillary: 81 mg/dL (ref 70–99)

## 2019-02-18 LAB — CBC
HCT: 40.5 % (ref 39.0–52.0)
Hemoglobin: 12.6 g/dL — ABNORMAL LOW (ref 13.0–17.0)
MCH: 31.3 pg (ref 26.0–34.0)
MCHC: 31.1 g/dL (ref 30.0–36.0)
MCV: 100.7 fL — ABNORMAL HIGH (ref 80.0–100.0)
Platelets: 165 10*3/uL (ref 150–400)
RBC: 4.02 MIL/uL — ABNORMAL LOW (ref 4.22–5.81)
RDW: 16.9 % — ABNORMAL HIGH (ref 11.5–15.5)
WBC: 7.9 10*3/uL (ref 4.0–10.5)
nRBC: 0 % (ref 0.0–0.2)

## 2019-02-18 LAB — BASIC METABOLIC PANEL
Anion gap: 10 (ref 5–15)
BUN: 58 mg/dL — ABNORMAL HIGH (ref 8–23)
CO2: 28 mmol/L (ref 22–32)
Calcium: 9 mg/dL (ref 8.9–10.3)
Chloride: 101 mmol/L (ref 98–111)
Creatinine, Ser: 3.07 mg/dL — ABNORMAL HIGH (ref 0.61–1.24)
GFR calc Af Amer: 24 mL/min — ABNORMAL LOW (ref >60)
GFR calc non Af Amer: 21 mL/min — ABNORMAL LOW (ref >60)
Glucose, Bld: 108 mg/dL — ABNORMAL HIGH (ref 70–99)
Potassium: 4 mmol/L (ref 3.5–5.1)
Sodium: 139 mmol/L (ref 135–145)

## 2019-02-18 LAB — MAGNESIUM: Magnesium: 2.2 mg/dL (ref 1.7–2.4)

## 2019-02-18 LAB — PROTIME-INR
INR: 2.7 — ABNORMAL HIGH (ref 0.8–1.2)
Prothrombin Time: 28.4 seconds — ABNORMAL HIGH (ref 11.4–15.2)

## 2019-02-18 LAB — COOXEMETRY PANEL
Carboxyhemoglobin: 1.3 % (ref 0.5–1.5)
Methemoglobin: 0.6 % (ref 0.0–1.5)
O2 Saturation: 56.9 %
Total hemoglobin: 12.7 g/dL (ref 12.0–16.0)

## 2019-02-18 MED ORDER — METOLAZONE 5 MG PO TABS
5.0000 mg | ORAL_TABLET | Freq: Once | ORAL | Status: AC
  Administered 2019-02-18: 5 mg via ORAL
  Filled 2019-02-18: qty 1

## 2019-02-18 MED ORDER — WARFARIN SODIUM 2.5 MG PO TABS
2.5000 mg | ORAL_TABLET | Freq: Once | ORAL | Status: AC
  Administered 2019-02-18: 17:00:00 2.5 mg via ORAL
  Filled 2019-02-18: qty 1

## 2019-02-18 NOTE — Progress Notes (Signed)
ANTICOAGULATION CONSULT NOTE - Follow Up Consult  Pharmacy Consult for Warfarin Indication: atrial fibrillation  No Known Allergies  Patient Measurements: Height: 5\' 10"  (177.8 cm) Weight: 280 lb 4.8 oz (127.1 kg) IBW/kg (Calculated) : 73  Vital Signs: Temp: 98.1 F (36.7 C) (10/17 0447) Temp Source: Oral (10/17 0447) BP: 113/84 (10/17 0447) Pulse Rate: 91 (10/17 0447)  Labs: Recent Labs    02/16/19 0315  02/16/19 1022 02/17/19 0444 02/18/19 0517  HGB 12.4*   < > 12.6* 11.9* 12.6*  HCT 37.7*   < > 37.0* 37.7* 40.5  PLT 171  --   --  158 165  LABPROT 29.7*  --   --  30.2* 28.4*  INR 2.9*  --   --  2.9* 2.7*  CREATININE 2.97*  --   --  2.88* 3.07*   < > = values in this interval not displayed.    Estimated Creatinine Clearance: 33.4 mL/min (A) (by C-G formula based on SCr of 3.07 mg/dL (H)).   Medical History: Past Medical History:  Diagnosis Date  . CAD (coronary artery disease)    a. Initial nonobst by cath 2009. b. Cath 01/2013 in setting of VT storm: obstructive distal Cx disease (small and terminates in the AV groove, unlikely to cause significant ischemia or electrical instability), nonobstructive RCA disease, EF 15-20%.   . Cerebrovascular accident [facility])    a. 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 Chronic systolic CHF (congestive heart failure) ([place])    a. Likely NICM (out of proportion to CAD). b. 2009 - EF 25-30% by echo, 01/2013: 15-20% by cath. c. 11/2014 Echo: EF 35-40%, Gr1 DD, mild MR, mod TR, PASP 47mmHg.  . CKD (chronic kidney disease), stage II   . Dyslipidemia   . Gout   . HTN (hypertension)   . Hypokalemia   . ICD (implantable cardiac defibrillator) in place   . Insulin dependent diabetes mellitus   . Lipoma   . Nonischemic cardiomyopathy ([place])    a.  11/2014 Echo: EF 35-40%, Gr1 DD, mild MR, mod TR, PASP 21mmHg.  . OSA (obstructive sleep apnea)    does not wear cpap  . PAF (paroxysmal atrial fibrillation) ([place])    a. Noted 05/2008 by EKG;  b.  CHA2DS2VASc = 5-6-->coumadin.  . Paroxysmal VT ([place])    a. s/p St. Jude ICD 2007. b. H/o paroxysmal VT/VF including VT storm 12/2012 admission prompting amio initiation;  c. 01/2013 ICD upgrade SJM 1411-36Q Ellipse VR single lead ICD.  [name] Pulmonary HTN ([place])    a. Mild by cath 01/2013.    Medications:  Scheduled:  . acetaZOLAMIDE  250 mg Oral TID  . amiodarone  200 mg Oral BID  . atorvastatin  80 mg Oral q1800  . Chlorhexidine Gluconate Cloth  6 each Topical Daily  . potassium chloride  40 mEq Oral Daily  . sildenafil  20 mg Oral TID  . sodium chloride flush  3 mL Intravenous Q12H  . sodium chloride flush  3 mL Intravenous Q12H  . sodium chloride flush  3 mL Intravenous Q12H  . Warfarin - Pharmacist Dosing Inpatient   Does not apply q1800   Infusions:  . sodium chloride    . sodium chloride    . furosemide (LASIX) infusion 20 mg/hr (02/18/19 0000)    Assessment: Pt is a 63 y/o male with PMH of CHF (EF 20-25%), prior VT s/p St Jude ICD, CAD, CVA, OSA on CPAP, pulmonary HTN, HTN, DM, CKDs stage III, and  PAF on coumadin PTA. Pt presents to the ED with volume overload diuresing with IV furosemide. Pharmacy has been consulted to dose warfarin  Patient's INR today is within goal, 2.7.  CBC is stable with Hgb 12.6, plt WNL. No s/sx of bleeding per notes.  Patient is s/p RHC 10/15.   His new outpatient warfarin regimen is 5mg  daily, except 2.5 mg on fridays.    Goal of Therapy:  INR 2-3 Monitor platelets by anticoagulation protocol: Yes   Plan:  Give warfarin 2.5 mg x 1 tonight Monitor for signs/symptoms of bleeding Check daily INR, CBC  Lorel Monaco, PharmD PGY1 Ambulatory Care Resident [employer] # [phone]

## 2019-02-18 NOTE — Progress Notes (Signed)
Patient ID: [name], male   DOB: 10-22-1955, 63 y.o.   MRN: [id number]     Advanced Heart Failure Rounding Note  PCP-Cardiologist: Minus Breeding, MD  [facility]: Dr. Haroldine Laws   Subjective:    RHC on milrinone 0.125 RA = 16 RV = 63/18 PA = 66/29 (47) PCW = 11 Fick cardiac output/index = 5.6/2.3 Thermo CO/CI = 4.6/1.9 PVR = 6.5 (Fick) 7.9 (therm) FA sat = 96% PA sat = 62%, 62%  Hemodynamics most c/w with PAH and RV failure.   Milrinone gtt stopped with very frequent ventricular ectopy.  He is on Lasix gtt 20 mg/hr with acetazolamide 250 tid.   No dyspnea but complains of swelling.  SBP 90s-100s.  He remains in NSR.  Weight unchanged, UOP not vigorous.  Creatinine 2.88 => 3.05.   Objective:   Weight Range: 127.1 kg Body mass index is 40.22 kg/m.   Vital Signs:   Temp:  [97.5 F (36.4 C)-98.1 F (36.7 C)] 98 F (36.7 C) (10/17 0759) Pulse Rate:  [73-96] 82 (10/17 0759) Resp:  [17-18] 17 (10/17 0759) BP: (96-124)/(76-97) 96/76 (10/17 0759) SpO2:  [90 %-96 %] 96 % (10/17 0759) Weight:  [127.1 kg] 127.1 kg (10/17 0447) Last BM Date: 02/15/19  Weight change: Filed Weights   02/16/19 0658 02/17/19 0459 02/18/19 0447  Weight: 125.6 kg 127 kg 127.1 kg    Intake/Output:   Intake/Output Summary (Last 24 hours) at 02/18/2019 1104 Last data filed at 02/18/2019 0540 Gross per 24 hour  Intake 1192.15 ml  Output 1265 ml  Net -72.85 ml      Physical Exam    General: NAD Neck: JVP 16, no thyromegaly or thyroid nodule.  Lungs: Decreased at bases.  CV: Lateral PMI.  Heart regular S1/S2, no S3/S4, no murmur.  2+ edema to thighs, unna boots in place.   Abdomen: Soft, nontender, no hepatosplenomegaly, no distention.  Skin: Intact without lesions or rashes.  Neurologic: Alert and oriented x 3.  Psych: Normal affect. Extremities: No clubbing or cyanosis.  HEENT: Normal.    Telemetry   NSR with PVCs (personally reviewed).   EKG    N/A  Labs    CBC Recent  Labs    02/17/19 0444 02/18/19 0517  WBC 8.5 7.9  HGB 11.9* 12.6*  HCT 37.7* 40.5  MCV 100.3* 100.7*  PLT 158 630   Basic Metabolic Panel Recent Labs    02/17/19 0444 02/18/19 0517  NA 139 139  K 4.2 4.0  CL 101 101  CO2 28 28  GLUCOSE 115* 108*  BUN 61* 58*  CREATININE 2.88* 3.07*  CALCIUM 8.8* 9.0  MG 2.3 2.2   Liver Function Tests No results for input(s): AST, ALT, ALKPHOS, BILITOT, PROT, ALBUMIN in the last 72 hours. No results for input(s): LIPASE, AMYLASE in the last 72 hours. Cardiac Enzymes No results for input(s): CKTOTAL, CKMB, CKMBINDEX, TROPONINI in the last 72 hours.  BNP: BNP (last 3 results) Recent Labs    07/19/18 1106 01/02/19 1552 02/13/19 1114  BNP 1,608.5* 2,154.7* 2,637.3*    ProBNP (last 3 results) Recent Labs    11/01/18 1229  PROBNP 2,158.0*     D-Dimer No results for input(s): DDIMER in the last 72 hours. Hemoglobin A1C No results for input(s): HGBA1C in the last 72 hours. Fasting Lipid Panel No results for input(s): CHOL, HDL, LDLCALC, TRIG, CHOLHDL, LDLDIRECT in the last 72 hours. Thyroid Function Tests No results for input(s): TSH, T4TOTAL, T3FREE, THYROIDAB in the last  72 hours.  Invalid input(s): FREET3  Other results:   Imaging    No results found.   Medications:     Scheduled Medications: . acetaZOLAMIDE  250 mg Oral TID  . amiodarone  200 mg Oral BID  . atorvastatin  80 mg Oral q1800  . Chlorhexidine Gluconate Cloth  6 each Topical Daily  . metolazone  5 mg Oral Once  . potassium chloride  40 mEq Oral Daily  . sildenafil  20 mg Oral TID  . sodium chloride flush  3 mL Intravenous Q12H  . sodium chloride flush  3 mL Intravenous Q12H  . sodium chloride flush  3 mL Intravenous Q12H  . warfarin  2.5 mg Oral ONCE-1800  . Warfarin - Pharmacist Dosing Inpatient   Does not apply q1800    Infusions: . sodium chloride    . sodium chloride    . furosemide (LASIX) infusion 20 mg/hr (02/18/19 0000)    PRN  Medications: sodium chloride, sodium chloride, acetaminophen, ondansetron (ZOFRAN) IV, sodium chloride flush, sodium chloride flush    Patient Profile   63 y/o male with severe systolic HF with biventricular dysfunction due to NICM and advanced CKD. Recently hospitalized with low output HF. Now returns with progressive NYHA IV symptoms with marked fluid overload not responding to titration of oral diuretic regimen. With CKD 4, biventricular HF and morbid obesity advanced options are quite limited.  Assessment/Plan    1. Acute on Chronic Systolic HF with biventricular dysfunction due to NICM: mild nonobstructive CAD on [facility] 12/2018. CM out of proportion of CAD. PYP scan and myeloma panel completed and were not suggestive of cardiac amyloidosis. S/p St Jude ICD. ECHO 07/28/2017 EF 20-25%. RV mildly dilated. Echo 11/17/18 EF 20-25% RV dilated and severely HK with septal flattening. Presented to ED 10/12 w/ NYHA IV symptoms and markedly volume overload w/ 3+ bilateral LEE, left basilar rales and elevated JVD to level of ear. Admit Wt up 10+ lb from home baseline. BNP 2600 on admit.  -Poor initial response to high dose IV diuretics w/ increase in SCr from  2.76>>3.30, prompting initiation of inotropic support, started on milrinone. Milrinone later stopped due to ventricular ectopy. Co-ox 57% today.  - [place] 10/15 with R sided HF and PAH.  - Still markedly volume overloaded by exam, CVP not set up.  Not responding well to Lasix gtt 20 mg/hr + acetazolamide.  Creatinine up to 3.07.  Continue Lasix/acetazolamide, will give metolazone 5 mg x 1 today. - Follow CVP.  - If diuresis continues to be poor, will need to consider HD for volume removal.  - continue unna boots  - ? blocker (coreg) on hold due to low output -Off digoxin due to AKI -Hold bidil for now given soft BP -Off spiro and ACE/ARB due to CKD/AKI -He continues to deteriorate with biventricular dysfunction/PAH and progressive CKD. Only real  option seems to be consideration of heart/kidney transplant.  - Needs palliative care consult to discus GOC.   2. CKD stage 3 - Baseline creatinine ~1.9. Recent Cr 2.8 -> 2.4-> 2.7-> 3.4 -> 2.97->2.76->2.8 -> 3.07. Follow bmet daily.  - Suspect cardiorenal syndrome - Marked volume overload, as above if unable to diurese will have to consider HD.   3. CAD - LHC8/20with LAD 20%50% distal left circ and 20% mid RCA stenosis. No significant disease in left main or LAD. - No s/s ischemia. Hs Trop 284 but likely demand ischemia in the setting of a/c CHF and abnormal renal function.   -  Continue statin. - Hold ? blocker due to soft BP/ concern for low output - No ASA due to chronic coumadin  4. PAF  -s/p recent DC-CV for AFL. -stop IV amio. Start amio 200 mg bid.    - On coumadin, followed by coumadin clinic on [place].    6. [place] ICD.  -Had VT episode (rate 190 bpm) w/ delivery of shock on 02/07/19  -Started on IV amiodarone 10/14 w/ improvement, NSVT less frequent off milrinone.  - Continue amiodarone 200 mg po twice a day.  -Keep K >4.0 and Mg > 2.0 . Stable today.   7. Pulmonary HTN - On home O2 (2L).  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 Had negative V/Q scan ruling out CTEPH.  - RHC PVR 6.5. Started on sildenafil 20 mg three times a day.   8. Hypokalemia: stable today.    Length of Stay: [place], MD  02/18/2019, 11:04 AM  Advanced Heart Failure Team Pager [phone] (M-F; 7a - 4p)  Please contact [facility] for night-coverage after hours (4p -7a ) and weekends on [email or web address]

## 2019-02-19 DIAGNOSIS — I5043 Acute on chronic combined systolic (congestive) and diastolic (congestive) heart failure: Secondary | ICD-10-CM | POA: Diagnosis not present

## 2019-02-19 LAB — CBC
HCT: 40 % (ref 39.0–52.0)
Hemoglobin: 12.3 g/dL — ABNORMAL LOW (ref 13.0–17.0)
MCH: 31.2 pg (ref 26.0–34.0)
MCHC: 30.8 g/dL (ref 30.0–36.0)
MCV: 101.5 fL — ABNORMAL HIGH (ref 80.0–100.0)
Platelets: 182 10*3/uL (ref 150–400)
RBC: 3.94 MIL/uL — ABNORMAL LOW (ref 4.22–5.81)
RDW: 16.8 % — ABNORMAL HIGH (ref 11.5–15.5)
WBC: 6.7 10*3/uL (ref 4.0–10.5)
nRBC: 0 % (ref 0.0–0.2)

## 2019-02-19 LAB — GLUCOSE, CAPILLARY
Glucose-Capillary: 94 mg/dL (ref 70–99)
Glucose-Capillary: 96 mg/dL (ref 70–99)

## 2019-02-19 LAB — BASIC METABOLIC PANEL
Anion gap: 11 (ref 5–15)
BUN: 55 mg/dL — ABNORMAL HIGH (ref 8–23)
CO2: 29 mmol/L (ref 22–32)
Calcium: 9.1 mg/dL (ref 8.9–10.3)
Chloride: 100 mmol/L (ref 98–111)
Creatinine, Ser: 3.01 mg/dL — ABNORMAL HIGH (ref 0.61–1.24)
GFR calc Af Amer: 25 mL/min — ABNORMAL LOW (ref >60)
GFR calc non Af Amer: 21 mL/min — ABNORMAL LOW (ref >60)
Glucose, Bld: 106 mg/dL — ABNORMAL HIGH (ref 70–99)
Potassium: 3.4 mmol/L — ABNORMAL LOW (ref 3.5–5.1)
Sodium: 140 mmol/L (ref 135–145)

## 2019-02-19 LAB — PROTIME-INR
INR: 2.4 — ABNORMAL HIGH (ref 0.8–1.2)
Prothrombin Time: 26.1 seconds — ABNORMAL HIGH (ref 11.4–15.2)

## 2019-02-19 LAB — COOXEMETRY PANEL
Carboxyhemoglobin: 1.4 % (ref 0.5–1.5)
Methemoglobin: 0.6 % (ref 0.0–1.5)
O2 Saturation: 64 %
Total hemoglobin: 12.7 g/dL (ref 12.0–16.0)

## 2019-02-19 LAB — MAGNESIUM: Magnesium: 2.3 mg/dL (ref 1.7–2.4)

## 2019-02-19 MED ORDER — POTASSIUM CHLORIDE CRYS ER 20 MEQ PO TBCR
40.0000 meq | EXTENDED_RELEASE_TABLET | Freq: Once | ORAL | Status: AC
  Administered 2019-02-19: 40 meq via ORAL
  Filled 2019-02-19: qty 2

## 2019-02-19 MED ORDER — WARFARIN SODIUM 2.5 MG PO TABS
2.5000 mg | ORAL_TABLET | Freq: Once | ORAL | Status: AC
  Administered 2019-02-19: 17:00:00 2.5 mg via ORAL
  Filled 2019-02-19: qty 1

## 2019-02-19 MED ORDER — METOLAZONE 5 MG PO TABS
5.0000 mg | ORAL_TABLET | Freq: Once | ORAL | Status: AC
  Administered 2019-02-19: 5 mg via ORAL
  Filled 2019-02-19: qty 1

## 2019-02-19 MED ORDER — METOLAZONE 5 MG PO TABS: 5.0000 mg | ORAL_TABLET | Freq: Two times a day (BID) | ORAL | Status: DC

## 2019-02-19 NOTE — Progress Notes (Signed)
ANTICOAGULATION CONSULT NOTE - Follow Up Consult  Pharmacy Consult for Warfarin Indication: atrial fibrillation  No Known Allergies  Patient Measurements: Height: 5\' 10"  (177.8 cm) Weight: 275 lb 4.8 oz (124.9 kg) IBW/kg (Calculated) : 73  Vital Signs: Temp: 97.5 F (36.4 C) (10/18 0335) Temp Source: Oral (10/18 0335) BP: 103/70 (10/18 0335) Pulse Rate: 75 (10/18 0335)  Labs: Recent Labs    02/17/19 0444 02/18/19 0517 02/19/19 0447  HGB 11.9* 12.6* 12.3*  HCT 37.7* 40.5 40.0  PLT 158 165 182  LABPROT 30.2* 28.4* 26.1*  INR 2.9* 2.7* 2.4*  CREATININE 2.88* 3.07* 3.01*    Estimated Creatinine Clearance: 33.8 mL/min (A) (by C-G formula based on SCr of 3.01 mg/dL (H)).   Medical History: Past Medical History:  Diagnosis Date  . CAD (coronary artery disease)    a. Initial nonobst by cath 2009. b. Cath 01/2013 in setting of VT storm: obstructive distal Cx disease (small and terminates in the AV groove, unlikely to cause significant ischemia or electrical instability), nonobstructive RCA disease, EF 15-20%.   . Cerebrovascular accident [facility])    a. 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 Chronic systolic CHF (congestive heart failure) ([place])    a. Likely NICM (out of proportion to CAD). b. 2009 - EF 25-30% by echo, 01/2013: 15-20% by cath. c. 11/2014 Echo: EF 35-40%, Gr1 DD, mild MR, mod TR, PASP 58mmHg.  . CKD (chronic kidney disease), stage II   . Dyslipidemia   . Gout   . HTN (hypertension)   . Hypokalemia   . ICD (implantable cardiac defibrillator) in place   . Insulin dependent diabetes mellitus   . Lipoma   . Nonischemic cardiomyopathy ([place])    a.  11/2014 Echo: EF 35-40%, Gr1 DD, mild MR, mod TR, PASP 49mmHg.  . OSA (obstructive sleep apnea)    does not wear cpap  . PAF (paroxysmal atrial fibrillation) ([place])    a. Noted 05/2008 by EKG;  b. CHA2DS2VASc = 5-6-->coumadin.  . Paroxysmal VT ([place])    a. s/p St. Jude ICD 2007. b. H/o paroxysmal VT/VF including VT storm  12/2012 admission prompting amio initiation;  c. 01/2013 ICD upgrade SJM [id number] Ellipse VR single lead ICD.  [name] Pulmonary HTN ([place])    a. Mild by cath 01/2013.    Medications:  Scheduled:  . acetaZOLAMIDE  250 mg Oral TID  . amiodarone  200 mg Oral BID  . atorvastatin  80 mg Oral q1800  . Chlorhexidine Gluconate Cloth  6 each Topical Daily  . potassium chloride  40 mEq Oral Daily  . sildenafil  20 mg Oral TID  . sodium chloride flush  3 mL Intravenous Q12H  . sodium chloride flush  3 mL Intravenous Q12H  . sodium chloride flush  3 mL Intravenous Q12H  . Warfarin - Pharmacist Dosing Inpatient   Does not apply q1800   Infusions:  . sodium chloride    . sodium chloride    . furosemide (LASIX) infusion 20 mg/hr (02/19/19 0237)    Assessment: Pt is a 63 y/o male with PMH of CHF (EF 20-25%), prior VT s/p St Jude ICD, CAD, CVA, OSA on CPAP, pulmonary HTN, HTN, DM, CKDs stage III, and PAF on coumadin PTA (5mg  daily, except 2.5 mg on fridays). Pt presents to the ED with volume overload diuresing with IV furosemide. Pharmacy has been consulted to dose warfarin.  Patient is s/p RHC 10/15.    Patient's INR today is within goal, 2.4.  CBC  is stable with Hgb 12.3 plt WNL. No s/sx of bleeding per notes.  Goal of Therapy:  INR 2-3 Monitor platelets by anticoagulation protocol: Yes   Plan:  Give warfarin 2.5 mg x 1 tonight Monitor for signs/symptoms of bleeding Check daily INR, CBC  Lorel Monaco, PharmD PGY1 Ambulatory Care Resident [employer] # [phone]

## 2019-02-19 NOTE — Evaluation (Signed)
Physical Therapy Evaluation Patient Details Name: [name] MRN: [id number] DOB: Sep 25, 1955 Today's Date: 02/19/2019   History of Present Illness  [name] is a 63 y.o. male with history of longstanding cardiomyopathy out of proportion to CAD/chronic biventricular CHF (EF 20-25% 07/2017), prior VT, s/p St Jude ICD, CAD, CVA, OSA on CPAP, pulmonary HTN on O2 at home, PAF on coumadin, HTN, DM, and CKD stage 3.  Clinical Impression  Pt admitted with above diagnosis. Managing independently at home, but with recent admission; Present to PT with decr activity tolerance, bil knee pain limiting ambulation;  Pt currently with functional limitations due to the deficits listed below (see PT Problem List). Pt will benefit from skilled PT to increase their independence and safety with mobility to allow discharge to the venue listed below.       Follow Up Recommendations Home health PT;Supervision/Assistance - 24 hour;Other (comment)(if slow progress, worht considering CIR)    Equipment Recommendations  Rolling walker with 5" wheels;3in1 (PT);Other (comment)(Consider Bariatric 4 wheeled RW)    Recommendations for Other Services OT consult(will order per protocol)     Precautions / Restrictions Precautions Precautions: Fall      Mobility  Bed Mobility Overal bed mobility: Needs Assistance Bed Mobility: Supine to Sit     Supine to sit: Mod assist     General bed mobility comments: Mod assist to help LEs clear EOB and then mod handheld assist to pull to sit  Transfers Overall transfer level: Needs assistance Equipment used: Rolling walker (2 wheeled) Transfers: Sit to/from Stand Sit to Stand: Mod assist;Min assist         General transfer comment: Initially unable to stand from low surface with mod assist; able to stand from elevated bed with min assist  Ambulation/Gait Ambulation/Gait assistance: Min guard Gait Distance (Feet): 25 Feet Assistive device: Rolling walker (2  wheeled) Gait Pattern/deviations: Step-through pattern Gait velocity: slowed   General Gait Details: Good [place] of RW to unweigh painful knees; cues ot self-monitor for activity tolernace  Stairs            Wheelchair Mobility    Modified Rankin (Stroke Patients Only)       Balance Overall balance assessment: Needs assistance           Standing balance-Leahy Scale: Poor Standing balance comment: Dependent on RW support                             Pertinent Vitals/Pain Pain Assessment: Faces Faces Pain Scale: Hurts little more Pain Location: bil knees, just superior to knees, with standing; reports chronic arthritic pan Pain Descriptors / Indicators: Aching    Home Living Family/patient expects to be discharged to:: Private residence Living Arrangements: Spouse/significant other;Children Available Help at Discharge: Family;Available 24 hours/day Type of Home: House Home Access: Stairs to enter Entrance Stairs-Rails: None Entrance Stairs-Number of Steps: 2(2 at front, 4 at back) Home Layout: One level Home Equipment: None Additional Comments: Need more information re: home equipment    Prior Function Level of Independence: Independent               Hand Dominance        Extremity/Trunk Assessment   Upper Extremity Assessment Upper Extremity Assessment: Generalized weakness    Lower Extremity Assessment Lower Extremity Assessment: Generalized weakness(with noted bil LE edema)       Communication   Communication: No difficulties  Cognition Arousal/Alertness: Awake/alert Behavior During  Therapy: WFL for tasks assessed/performed Overall Cognitive Status: Within Functional Limits for tasks assessed                                        General Comments General comments (skin integrity, edema, etc.): Walked on 3L supplemental O2; no dyspnea noted    Exercises     Assessment/Plan    PT Assessment Patient needs  continued PT services  PT Problem List Decreased strength;Decreased range of motion;Decreased activity tolerance;Decreased balance;Decreased mobility;Decreased knowledge of use of DME;Decreased safety awareness;Decreased knowledge of precautions;Cardiopulmonary status limiting activity       PT Treatment Interventions DME instruction;Gait training;Stair training;Functional mobility training;Therapeutic activities;Therapeutic exercise;Balance training;Patient/family education    PT Goals (Current goals can be found in the Care Plan section)  Acute Rehab PT Goals Patient Stated Goal: Told me he looks forward to getting to Cardiac REhab pahse 2 PT Goal Formulation: With patient Time For Goal Achievement: 03/05/19 Potential to Achieve Goals: Good    Frequency Min 3X/week   Barriers to discharge        Co-evaluation               AM-PAC PT "6 Clicks" Mobility  Outcome Measure Help needed turning from your back to your side while in a flat bed without using bedrails?: A Lot Help needed moving from lying on your back to sitting on the side of a flat bed without using bedrails?: A Lot Help needed moving to and from a bed to a chair (including a wheelchair)?: A Lot Help needed standing up from a chair using your arms (e.g., wheelchair or bedside chair)?: A Lot Help needed to walk in hospital room?: A Little Help needed climbing 3-5 steps with a railing? : A Lot 6 Click Score: 13    End of Session Equipment Utilized During Treatment: Gait belt Activity Tolerance: Patient tolerated treatment well Patient left: in chair;with call bell/phone within reach Nurse Communication: Mobility status PT Visit Diagnosis: Unsteadiness on feet (R26.81);Other abnormalities of gait and mobility (R26.89);Muscle weakness (generalized) (M62.81)    Time: 1416-1501(minus approx 5 min to return a page) PT Time Calculation (min) (ACUTE ONLY): 45 min   Charges:   PT Evaluation $PT Eval Moderate  Complexity: 1 Mod PT Treatments $Gait Training: 8-22 mins $Therapeutic Activity: 8-22 mins        Roney Marion, PT  Acute Rehabilitation Services Pager [phone] Office [place] 02/19/2019, 4:42 PM

## 2019-02-19 NOTE — Progress Notes (Signed)
Patient ID: KASE SHUGHART, male   DOB: [birth year], 63 y.o.   MRN: [id number]     Advanced Heart Failure Rounding Note  PCP-Cardiologist: Minus Breeding, MD  [facility]: Dr. Haroldine Laws   Subjective:    RHC on milrinone 0.125 RA = 16 RV = 63/18 PA = 66/29 (47) PCW = 11 Fick cardiac output/index = 5.6/2.3 Thermo CO/CI = 4.6/1.9 PVR = 6.5 (Fick) 7.9 (therm) FA sat = 96% PA sat = 62%, 62%  Hemodynamics most c/w with PAH and RV failure.   Milrinone gtt stopped with very frequent ventricular ectopy.  He is on Lasix gtt 20 mg/hr with acetazolamide 250 tid. He got 1 dose of metolazone 5 mg yesterday and diuresed quite well, weight down 5 lbs.  Co-ox 64% with CVP 17.  Still short of breath with ambulation.  SBP 90s-100s.  He remains in NSR.  Creatinine 2.88 => 3.05 => 3.01.   Objective:   Weight Range: 124.9 kg Body mass index is 39.5 kg/m.   Vital Signs:   Temp:  [97.5 F (36.4 C)-98 F (36.7 C)] 97.5 F (36.4 C) (10/18 0824) Pulse Rate:  [70-86] 70 (10/18 0824) Resp:  [18-19] 19 (10/18 0335) BP: (86-103)/(59-76) 86/67 (10/18 0824) SpO2:  [91 %-100 %] 98 % (10/18 0824) Weight:  [124.9 kg] 124.9 kg (10/18 0335) Last BM Date: 02/18/19  Weight change: Filed Weights   02/17/19 0459 02/18/19 0447 02/19/19 0335  Weight: 127 kg 127.1 kg 124.9 kg    Intake/Output:   Intake/Output Summary (Last 24 hours) at 02/19/2019 0834 Last data filed at 02/19/2019 0500 Gross per 24 hour  Intake 1623 ml  Output 3030 ml  Net -1407 ml      Physical Exam    General: NAD Neck: JVP 16 cm, no thyromegaly or thyroid nodule.  Lungs: Decreased at bases.  CV: Nonpalpable PMI.  Heart regular S1/S2, no S3/S4, 2/6 HSM LLSB/apex.  1+ edema to thighs.   Abdomen: Soft, nontender, no hepatosplenomegaly, no distention.  Skin: Intact without lesions or rashes.  Neurologic: Alert and oriented x 3.  Psych: Normal affect. Extremities: No clubbing or cyanosis.  HEENT: Normal.    Telemetry   NSR with  PVCs 80s (personally reviewed).   EKG    N/A  Labs    CBC Recent Labs    02/18/19 0517 02/19/19 0447  WBC 7.9 6.7  HGB 12.6* 12.3*  HCT 40.5 40.0  MCV 100.7* 101.5*  PLT 165 546   Basic Metabolic Panel Recent Labs    02/18/19 0517 02/19/19 0447  NA 139 140  K 4.0 3.4*  CL 101 100  CO2 28 29  GLUCOSE 108* 106*  BUN 58* 55*  CREATININE 3.07* 3.01*  CALCIUM 9.0 9.1  MG 2.2 2.3   Liver Function Tests No results for input(s): AST, ALT, ALKPHOS, BILITOT, PROT, ALBUMIN in the last 72 hours. No results for input(s): LIPASE, AMYLASE in the last 72 hours. Cardiac Enzymes No results for input(s): CKTOTAL, CKMB, CKMBINDEX, TROPONINI in the last 72 hours.  BNP: BNP (last 3 results) Recent Labs    07/19/18 1106 01/02/19 1552 02/13/19 1114  BNP 1,608.5* 2,154.7* 2,637.3*    ProBNP (last 3 results) Recent Labs    11/01/18 1229  PROBNP 2,158.0*     D-Dimer No results for input(s): DDIMER in the last 72 hours. Hemoglobin A1C No results for input(s): HGBA1C in the last 72 hours. Fasting Lipid Panel No results for input(s): CHOL, HDL, LDLCALC, TRIG, CHOLHDL, LDLDIRECT in the  last 72 hours. Thyroid Function Tests No results for input(s): TSH, T4TOTAL, T3FREE, THYROIDAB in the last 72 hours.  Invalid input(s): FREET3  Other results:   Imaging    No results found.   Medications:     Scheduled Medications: . acetaZOLAMIDE  250 mg Oral TID  . amiodarone  200 mg Oral BID  . atorvastatin  80 mg Oral q1800  . Chlorhexidine Gluconate Cloth  6 each Topical Daily  . metolazone  5 mg Oral BID  . potassium chloride  40 mEq Oral Daily  . potassium chloride  40 mEq Oral Once  . sildenafil  20 mg Oral TID  . sodium chloride flush  3 mL Intravenous Q12H  . sodium chloride flush  3 mL Intravenous Q12H  . sodium chloride flush  3 mL Intravenous Q12H  . warfarin  2.5 mg Oral ONCE-1800  . Warfarin - Pharmacist Dosing Inpatient   Does not apply q1800     Infusions: . sodium chloride    . sodium chloride    . furosemide (LASIX) infusion 20 mg/hr (02/19/19 0237)    PRN Medications: sodium chloride, sodium chloride, acetaminophen, ondansetron (ZOFRAN) IV, sodium chloride flush, sodium chloride flush    Patient Profile   63 y/o male with severe systolic HF with biventricular dysfunction due to NICM and advanced CKD. Recently hospitalized with low output HF. Now returns with progressive NYHA IV symptoms with marked fluid overload not responding to titration of oral diuretic regimen. With CKD 4, biventricular HF and morbid obesity advanced options are quite limited.  Assessment/Plan    1. Acute on Chronic Systolic HF with biventricular dysfunction due to NICM: mild nonobstructive CAD on [facility] 12/2018. CM out of proportion of CAD. PYP scan and myeloma panel completed and were not suggestive of cardiac amyloidosis. S/p St Jude ICD. ECHO 07/28/2017 EF 20-25%. RV mildly dilated. Echo 11/17/18 EF 20-25% RV dilated and severely HK with septal flattening. Presented to ED 10/12 w/ NYHA IV symptoms and markedly volume overload w/ 3+ bilateral LEE, left basilar rales and elevated JVD to level of ear. Admit Wt up 10+ lb from home baseline. BNP 2600 on admit.  -Poor initial response to high dose IV diuretics w/ increase in SCr from  2.76>>3.30, prompting initiation of inotropic support, started on milrinone. Milrinone later stopped due to ventricular ectopy. Co-ox 64% today.  - Meta 10/15 with R sided HF and PAH.  - Still markedly volume overloaded by exam, CVP 17.  Yesterday, he did well with metolazone 5 +  Lasix gtt 20 mg/hr + acetazolamide.  Weight down 5 lbs. Creatinine stable around 3.  Continue Lasix/acetazolamide, will give metolazone 5 mg x 1 again today. Replace K. - In future, may need to consider HD for volume removal.  - continue unna boots  - ? blocker (coreg) on hold due to low output -Off digoxin due to AKI -Hold bidil for now given soft BP  -Off spiro and ACE/ARB due to CKD/AKI -He continues to deteriorate with biventricular dysfunction/PAH and progressive CKD. Only real option seems to be consideration of heart/kidney transplant.  - Needs palliative care consult to discus GOC.   2. CKD stage 3 - Baseline creatinine ~1.9.  Cr 2.8 -> 2.4-> 2.7-> 3.4 -> 2.97->2.76->2.8 -> 3.07 -> 3.01. Follow bmet daily.  - Suspect cardiorenal syndrome - Marked volume overload, as above if unable to diurese will have to consider HD.   3. CAD - LHC8/20with LAD 20%50% distal left circ and 20% mid RCA  stenosis. No significant disease in left main or LAD. - No s/s ischemia. Hs Trop 284 but likely demand ischemia in the setting of a/c CHF and abnormal renal function.   - Continue statin. - Hold ? blocker due to soft BP/ concern for low output - No ASA due to chronic coumadin  4. PAF  -s/p recent DC-CV for AFL. - Continue amio 200 mg bid, he is in NSR.    - On coumadin, followed by coumadin clinic on [place].    6. [place] ICD.  -Had VT episode (rate 190 bpm) w/ delivery of shock on 02/07/19  -Started on IV amiodarone 10/14 w/ improvement, NSVT less frequent off milrinone.  - Continue amiodarone 200 mg po twice a day.  -Keep K >4.0 and Mg > 2.0 . Stable today.   7. Pulmonary HTN - On home O2 (2L).  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 Had negative V/Q scan ruling out CTEPH.  - RHC PVR 6.5. Started on sildenafil 20 mg three times a day.   8. Hypokalemia: Replace K.    Length of Stay: 6  [name], MD  02/19/2019, 8:34 AM  Advanced Heart Failure Team Pager [phone] (M-F; 7a - 4p)  Please contact [facility] for night-coverage after hours (4p -7a ) and weekends on [email or web address]

## 2019-02-20 DIAGNOSIS — I5043 Acute on chronic combined systolic (congestive) and diastolic (congestive) heart failure: Secondary | ICD-10-CM | POA: Diagnosis not present

## 2019-02-20 DIAGNOSIS — Z515 Encounter for palliative care: Secondary | ICD-10-CM

## 2019-02-20 DIAGNOSIS — Z7189 Other specified counseling: Secondary | ICD-10-CM

## 2019-02-20 LAB — GLUCOSE, CAPILLARY
Glucose-Capillary: 93 mg/dL (ref 70–99)
Glucose-Capillary: 104 mg/dL — ABNORMAL HIGH (ref 70–99)
Glucose-Capillary: 120 mg/dL — ABNORMAL HIGH (ref 70–99)
Glucose-Capillary: 102 mg/dL — ABNORMAL HIGH (ref 70–99)
Glucose-Capillary: 146 mg/dL — ABNORMAL HIGH (ref 70–99)
Glucose-Capillary: 139 mg/dL — ABNORMAL HIGH (ref 70–99)
Glucose-Capillary: 141 mg/dL — ABNORMAL HIGH (ref 70–99)

## 2019-02-20 LAB — BASIC METABOLIC PANEL
Anion gap: 12 (ref 5–15)
BUN: 59 mg/dL — ABNORMAL HIGH (ref 8–23)
CO2: 29 mmol/L (ref 22–32)
Calcium: 9.1 mg/dL (ref 8.9–10.3)
Chloride: 100 mmol/L (ref 98–111)
Creatinine, Ser: 3.55 mg/dL — ABNORMAL HIGH (ref 0.61–1.24)
GFR calc Af Amer: 20 mL/min — ABNORMAL LOW (ref >60)
GFR calc non Af Amer: 17 mL/min — ABNORMAL LOW (ref >60)
Glucose, Bld: 107 mg/dL — ABNORMAL HIGH (ref 70–99)
Potassium: 3.2 mmol/L — ABNORMAL LOW (ref 3.5–5.1)
Sodium: 141 mmol/L (ref 135–145)

## 2019-02-20 LAB — CBC
HCT: 42.3 % (ref 39.0–52.0)
Hemoglobin: 13.2 g/dL (ref 13.0–17.0)
MCH: 31.3 pg (ref 26.0–34.0)
MCHC: 31.2 g/dL (ref 30.0–36.0)
MCV: 100.2 fL — ABNORMAL HIGH (ref 80.0–100.0)
Platelets: 209 10*3/uL (ref 150–400)
RBC: 4.22 MIL/uL (ref 4.22–5.81)
RDW: 16.5 % — ABNORMAL HIGH (ref 11.5–15.5)
WBC: 7.5 10*3/uL (ref 4.0–10.5)
nRBC: 0 % (ref 0.0–0.2)

## 2019-02-20 LAB — COOXEMETRY PANEL
Carboxyhemoglobin: 1.4 % (ref 0.5–1.5)
Methemoglobin: 0.5 % (ref 0.0–1.5)
O2 Saturation: 73.4 %
Total hemoglobin: 13.5 g/dL (ref 12.0–16.0)

## 2019-02-20 LAB — PROTIME-INR
INR: 2.3 — ABNORMAL HIGH (ref 0.8–1.2)
Prothrombin Time: 25.2 seconds — ABNORMAL HIGH (ref 11.4–15.2)

## 2019-02-20 LAB — MAGNESIUM: Magnesium: 2.4 mg/dL (ref 1.7–2.4)

## 2019-02-20 MED ORDER — WARFARIN SODIUM 4 MG PO TABS
4.0000 mg | ORAL_TABLET | Freq: Once | ORAL | Status: AC
  Administered 2019-02-20: 4 mg via ORAL
  Filled 2019-02-20: qty 1

## 2019-02-20 MED ORDER — METOLAZONE 5 MG PO TABS
10.0000 mg | ORAL_TABLET | Freq: Every day | ORAL | Status: DC
  Administered 2019-02-20 – 2019-02-27 (×8): 10 mg via ORAL
  Filled 2019-02-20 (×7): qty 2

## 2019-02-20 MED ORDER — POTASSIUM CHLORIDE CRYS ER 20 MEQ PO TBCR
20.0000 meq | EXTENDED_RELEASE_TABLET | Freq: Once | ORAL | Status: AC
  Administered 2019-02-20: 20 meq via ORAL
  Filled 2019-02-20: qty 1

## 2019-02-20 MED ORDER — TORSEMIDE 20 MG PO TABS
100.0000 mg | ORAL_TABLET | Freq: Two times a day (BID) | ORAL | Status: DC
  Administered 2019-02-20 – 2019-02-28 (×15): 100 mg via ORAL
  Filled 2019-02-20 (×15): qty 5

## 2019-02-20 MED ORDER — SILDENAFIL CITRATE 20 MG PO TABS
20.0000 mg | ORAL_TABLET | Freq: Two times a day (BID) | ORAL | Status: DC
  Administered 2019-02-20 – 2019-03-01 (×18): 20 mg via ORAL
  Filled 2019-02-20 (×18): qty 1

## 2019-02-20 MED ORDER — MIDODRINE HCL 5 MG PO TABS
10.0000 mg | ORAL_TABLET | Freq: Three times a day (TID) | ORAL | Status: DC
  Administered 2019-02-20 – 2019-03-03 (×29): 10 mg via ORAL
  Filled 2019-02-20 (×31): qty 2

## 2019-02-20 MED ORDER — TORSEMIDE 20 MG PO TABS: 80.0000 mg | ORAL_TABLET | Freq: Two times a day (BID) | ORAL | Status: DC

## 2019-02-20 MED ORDER — LOPERAMIDE HCL 2 MG PO CAPS
2.0000 mg | ORAL_CAPSULE | ORAL | Status: DC | PRN
  Administered 2019-02-20 – 2019-02-22 (×4): 2 mg via ORAL
  Filled 2019-02-20 (×4): qty 1

## 2019-02-20 NOTE — Consult Note (Signed)
[facility] Renal Consultation Note  Requesting MD: Bensimhon Indication for Consultation: A on CRF- diuretic resistant volume overload  HPI:  [name] is a 63 y.o. 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 Fib and pulmonary hypertension on sildenafil.  He also has diabetes mellitus, gout as well as CKD.  Regarding his CKD creatinine seems to be in the high twos, he is followed by Dr. Johnney [name] at [facility].  Patient was admitted on 10/12 with worsening edema.  Creatinine at the time was 2.76.  Diuresis was attempted and successful in the short-term.  However, then began to see elevation of creatinine and less effective diuresis.  Milrinone was used briefly but stopped secondary to ventricular ectopy.  Was previously on IV diuretics but now what is ordered is torsemide 80 twice daily.  CVP is 7-8 but seems clinically volume overloaded-albumin is 3.  Blood pressure is soft-cannot see where any blood pressure medications have been given other than sildenafil.  Patient does appear to be uremic.  However, his mobility is very low mostly due to this volume overload.  Apparently, the issue of dialysis was raised if we are unable to get the fluid off with just medicines.  He tells me very clearly to do what ever I feel like I need to do  Creat  Date/Time Value Ref Range Status  12/17/2015 12:09 PM 1.37 (H) 0.70 - 1.33 mg/dL Final    Comment:      For patients > or = 63 years of age: The upper reference limit for Creatinine is approximately 13% higher for people identified as African-American.     12/02/2015 10:25 AM 1.60 (H) 0.70 - 1.33 mg/dL Final    Comment:      For patients > or = 63 years of age: The upper reference limit for Creatinine is approximately 13% higher for people identified as African-American.       Creatinine, Ser  Date/Time Value Ref Range Status  02/20/2019 09:15 AM 3.55 (H) 0.61 - 1.24 mg/dL Final  02/19/2019 04:47 AM 3.01 (H) 0.61 - 1.24 mg/dL Final  02/18/2019 05:17 AM 3.07 (H) 0.61 - 1.24 mg/dL Final  02/17/2019 04:44 AM 2.88 (H) 0.61 - 1.24 mg/dL Final  02/16/2019 03:15 AM 2.97 (H) 0.61 - 1.24 mg/dL Final  02/15/2019 05:40 AM 3.03 (H) 0.61 - 1.24 mg/dL Final  02/14/2019 04:45 PM 3.10 (H) 0.61 - 1.24 mg/dL Final  02/14/2019 04:08 AM 3.30 (H) 0.61 - 1.24 mg/dL Final  02/13/2019 11:09 AM 2.76 (H) 0.61 - 1.24 mg/dL Final  01/12/2019 09:47 AM 2.97 (H) 0.61 - 1.24 mg/dL Final  01/05/2019 05:13 AM 3.21 (H) 0.61 - 1.24 mg/dL Final  01/04/2019 04:29 AM 3.30 (H) 0.61 - 1.24 mg/dL Final  01/04/2019 04:29 AM 3.29 (H) 0.61 - 1.24 mg/dL Final  01/03/2019 05:10 AM 3.44 (H) 0.61 - 1.24 mg/dL Final  01/02/2019 01:35 PM 3.65 (H) 0.61 - 1.24 mg/dL Final  12/06/2018 11:07 AM 2.68 (H) 0.61 - 1.24 mg/dL Final  11/17/2018 01:14 PM 2.45 (H) 0.61 - 1.24 mg/dL Final  11/01/2018 12:29 PM 2.69 (H) 0.40 - 1.50 mg/dL Final  07/19/2018 11:05 AM 2.49 (H) 0.61 - 1.24 mg/dL Final  05/17/2018 11:48 AM 2.36 (H) 0.61 - 1.24 mg/dL Final  04/28/2018 10:56 AM 2.43 (H) 0.61 - 1.24 mg/dL Final  04/19/2018 11:55 AM 2.89 (H) 0.61 - 1.24 mg/dL Final  02/08/2018 12:14 PM 2.09 (H) 0.40 - 1.50 mg/dL Final  01/25/2018 11:58 AM 1.90 (H) 0.61 - 1.24 mg/dL Final  12/21/2017 10:56 AM 2.06 (H) 0.61 - 1.24 mg/dL Final  12/14/2017 11:17 AM 1.87 (H) 0.61 - 1.24 mg/dL Final  11/09/2017 11:53 AM 1.94 (H) 0.61 - 1.24 mg/dL Final  10/19/2017 11:43 AM 1.91 (H) 0.61 - 1.24 mg/dL Final  10/07/2017 03:56 AM 1.75 (H) 0.61 - 1.24 mg/dL Final  10/06/2017 04:58 AM 1.75 (H) 0.61 - 1.24 mg/dL Final  10/05/2017 05:26 AM 1.85 (H) 0.61 - 1.24 mg/dL Final  10/04/2017 04:27 AM 1.91 (H) 0.61 - 1.24 mg/dL Final  10/03/2017 03:41 PM 1.86 (H) 0.61 - 1.24 mg/dL Final  10/03/2017 05:20 AM 1.59 (H) 0.61 - 1.24 mg/dL Final  10/02/2017 07:20 AM  1.75 (H) 0.61 - 1.24 mg/dL Final  10/01/2017 01:46 PM 1.64 (H) 0.61 - 1.24 mg/dL Final  08/24/2017 02:17 PM 1.46 (H) 0.76 - 1.27 mg/dL Final  07/31/2017 01:52 AM 1.74 (H) 0.61 - 1.24 mg/dL Final  07/30/2017 05:36 AM 1.89 (H) 0.61 - 1.24 mg/dL Final  07/29/2017 05:17 AM 1.97 (H) 0.61 - 1.24 mg/dL Final  07/28/2017 03:20 AM 1.93 (H) 0.61 - 1.24 mg/dL Final  07/27/2017 11:14 AM 1.89 (H) 0.61 - 1.24 mg/dL Final  01/19/2017 11:47 AM 1.56 (H) 0.40 - 1.50 mg/dL Final  07/15/2016 12:20 PM 1.42 0.40 - 1.50 mg/dL Final  01/15/2016 11:49 AM 1.45 0.40 - 1.50 mg/dL Final  07/17/2015 11:30 AM 1.37 0.40 - 1.50 mg/dL Final  01/17/2015 11:29 AM 1.54 (H) 0.40 - 1.50 mg/dL Final  11/27/2014 05:25 PM 1.74 (H) 0.40 - 1.50 mg/dL Final  11/21/2014 04:00 AM 1.27 (H) 0.61 - 1.24 mg/dL Final  11/20/2014 02:40 AM 1.31 (H) 0.61 - 1.24 mg/dL Final  11/19/2014 02:21 AM 1.50 (H) 0.61 - 1.24 mg/dL Final  11/18/2014 02:28 AM 1.69 (H) 0.61 - 1.24 mg/dL Final     PMHx:   Past Medical History:  Diagnosis Date  . CAD (coronary artery disease)    a. Initial nonobst by cath 2009. b. Cath 01/2013 in setting of VT storm: obstructive distal Cx disease (small and terminates in the AV groove, unlikely to cause significant ischemia or electrical instability), nonobstructive RCA disease, EF 15-20%.   . Cerebrovascular accident [facility])    a. 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 Chronic systolic CHF (congestive heart failure) ([facility])    a. Likely NICM (out of proportion to CAD). b. 2009 - EF 25-30% by echo, 01/2013: 15-20% by cath. c. 11/2014 Echo: EF 35-40%, Gr1 DD, mild MR, mod TR, PASP 24mHg.  . CKD (chronic kidney disease), stage II   . Dyslipidemia   . Gout   . HTN (hypertension)   . Hypokalemia   . ICD (implantable cardiac defibrillator) in place   . Insulin dependent diabetes mellitus   . Lipoma   . Nonischemic cardiomyopathy ([facility]    a.  11/2014 Echo: EF 35-40%, Gr1 DD, mild MR, mod TR, PASP 436mg.  . OSA (obstructive  sleep apnea)    does not wear cpap  . PAF (paroxysmal atrial fibrillation) ([facility]   a. Noted 05/2008 by EKG;  b. CHA2DS2VASc = 5-6-->coumadin.  . Paroxysmal VT ([facility]   a. s/p St. Jude ICD 2007. b. H/o paroxysmal VT/VF including VT storm 12/2012 admission prompting amio initiation;  c. 01/2013 ICD upgrade SJM 1411-36Q Ellipse VR single lead ICD.  . [place] HTN ([facility]   a. Mild by cath 01/2013.  Past Surgical History:  Procedure Laterality Date  . CARDIAC CATHETERIZATION     Nonobstructive coronary disease 2009  . CARDIAC DEFIBRILLATOR PLACEMENT     ICD-St. Jude  . CARDIOVERSION N/A 01/03/2019   Procedure: CARDIOVERSION;  Surgeon: Jolaine Artist, MD;  Location[facility] ENDOSCOPY;  Service: Cardiovascular;  Laterality: N/A;  . CENTRAL LINE INSERTION  02/16/2019   Procedure: CENTRAL LINE INSERTION;  Surgeon: Jolaine Artist, MD;  Location: [place] CV LAB;  Service: Cardiovascular;;  . IMPLANTABLE CARDIOVERTER DEFIBRILLATOR (ICD) GENERATOR CHANGE N/A 01/15/2014   Procedure: ICD GENERATOR CHANGE;  Surgeon: Evans Lance, MD;  Location: [facility] CATH LAB;  Service: Cardiovascular;  Laterality: N/A;  . LEFT AND RIGHT HEART CATHETERIZATION WITH CORONARY ANGIOGRAM N/A 01/03/2013   Procedure: LEFT AND RIGHT HEART CATHETERIZATION WITH CORONARY ANGIOGRAM;  Surgeon: Peter M Martinique, MD;  Location: [facility] CATH LAB;  Service: Cardiovascular;  Laterality: N/A;  . LIPOMA EXCISION    . RIGHT HEART CATH N/A 02/16/2019   Procedure: RIGHT HEART CATH;  Surgeon: Jolaine Artist, MD;  Location: [place] CV LAB;  Service: Cardiovascular;  Laterality: N/A;  . RIGHT/LEFT HEART CATH AND CORONARY ANGIOGRAPHY N/A 10/04/2017   Procedure: RIGHT/LEFT HEART CATH AND CORONARY ANGIOGRAPHY;  Surgeon: Larey Dresser, MD;  Location: [place] CV LAB;  Service: Cardiovascular;  Laterality: N/A;  . RIGHT/LEFT HEART CATH AND CORONARY ANGIOGRAPHY N/A 12/06/2018   Procedure: RIGHT/LEFT HEART CATH AND CORONARY ANGIOGRAPHY;   Surgeon: Jolaine Artist, MD;  Location: [place] CV LAB;  Service: Cardiovascular;  Laterality: N/A;    Family Hx:  Family History  Problem Relation Age of Onset  . Coronary artery disease Mother   . Heart attack Mother   . Heart attack Maternal Uncle   . Heart attack Maternal Grandmother     Social History:  reports that he quit smoking about 32 years ago. He has never used smokeless tobacco. He reports that he does not drink alcohol or use drugs.  Allergies: No Known Allergies  Medications: Prior to Admission medications   Medication Sig Start Date End Date Taking? Authorizing Provider  amiodarone (PACERONE) 200 MG tablet Take 1 tablet (200 mg total) by mouth 2 (two) times daily. Patient taking differently: Take 200 mg by mouth daily.  01/05/19  Yes Swayze, Ava, DO  atorvastatin (LIPITOR) 80 MG tablet Take 80 mg by mouth daily at 6 PM.   Yes [provider]  carvedilol (COREG) 3.125 MG tablet Take 1 tablet (3.125 mg total) by mouth 2 (two) times daily with a meal. 02/03/19  Yes Biagio Borg, MD  Insulin Glargine (LANTUS SOLOSTAR) 100 UNIT/ML Solostar Pen Inject 10 units into the skin at bedtime   Yes [provider]  Insulin Pen Needle (B-D ULTRAFINE III SHORT PEN) 31G X 8 MM MISC USE ONCE DAILY WITH INSULIN 01/14/18  Yes Biagio Borg, MD  Insulin Pen Needle (PEN NEEDLES) 31G X 5 MM MISC 100 each by Does not apply route 2 (two) times daily. 11/12/17  Yes Shambley, Delphia Grates, NP  isosorbide-hydrALAZINE (BIDIL) 20-37.5 MG tablet Take 0.5 tablets by mouth 3 (three) times daily. 01/12/19  Yes Bensimhon, Shaune Pascal, MD  nitroGLYCERIN (NITROSTAT) 0.4 MG SL tablet Place 0.4 mg under the tongue every 5 (five) minutes as needed for chest pain.   Yes [provider]  OXYGEN Inhale into the lungs. 2L continuous; 3L on pulse machine   Yes [provider]  torsemide (DEMADEX) 20 MG tablet Take 2  tablets (40 mg total) by mouth daily. 01/06/19  Yes Swayze, Ava, DO   Turmeric 500 MG CAPS Take 1,000 mg by mouth daily.   Yes [provider]  warfarin (COUMADIN) 5 MG tablet Take 2.5-5 mg by mouth See admin instructions. 87m everyday except Friday; 2.581mon fridays   Yes [provider]    I have reviewed the patient's current medications.  Labs:  Results for orders placed or performed during the hospital encounter of 02/13/19 (from the past 48 hour(s))  Glucose, capillary     Status: Abnormal   Collection Time: 02/18/19  4:48 PM  Result Value Ref Range   Glucose-Capillary 142 (H) 70 - 99 mg/dL  Glucose, capillary     Status: None   Collection Time: 02/18/19  8:51 PM  Result Value Ref Range   Glucose-Capillary 81 70 - 99 mg/dL  .Cooxemetry Panel (carboxy, met, total hgb, O2 sat)     Status: None   Collection Time: 02/19/19  4:45 AM  Result Value Ref Range   Total hemoglobin 12.7 12.0 - 16.0 g/dL   O2 Saturation 64.0 %   Carboxyhemoglobin 1.4 0.5 - 1.5 %   Methemoglobin 0.6 0.0 - 1.5 %  Protime-INR     Status: Abnormal   Collection Time: 02/19/19  4:47 AM  Result Value Ref Range   Prothrombin Time 26.1 (H) 11.4 - 15.2 seconds   INR 2.4 (H) 0.8 - 1.2    Comment: (NOTE) INR goal varies based on device and disease states. Performed at [facility] [place] CBC     Status: Abnormal   Collection Time: 02/19/19  4:47 AM  Result Value Ref Range   WBC 6.7 4.0 - 10.5 K/uL   RBC 3.94 (L) 4.22 - 5.81 MIL/uL   Hemoglobin 12.3 (L) 13.0 - 17.0 g/dL   HCT 40.0 39.0 - 52.0 %   MCV 101.5 (H) 80.0 - 100.0 fL   MCH 31.2 26.0 - 34.0 pg   MCHC 30.8 30.0 - 36.0 g/dL   RDW 16.8 (H) 11.5 - 15.5 %   Platelets 182 150 - 400 K/uL   nRBC 0.0 0.0 - 0.2 %    Comment: Performed at [facility] [place] [id number] metabolic panel     Status: Abnormal   Collection Time: 02/19/19  4:47 AM  Result Value Ref Range   Sodium 140 135 - 145 mmol/L   Potassium 3.4 (L) 3.5 -  5.1 mmol/L   Chloride 100 98 - 111 mmol/L   CO2 29 22 - 32 mmol/L   Glucose, Bld 106 (H) 70 - 99 mg/dL   BUN 55 (H) 8 - 23 mg/dL   Creatinine, Ser 3.01 (H) 0.61 - 1.24 mg/dL   Calcium 9.1 8.9 - 10.3 mg/dL   GFR calc non Af Amer 21 (L) >60 mL/min   GFR calc Af Amer 25 (L) >60 mL/min   Anion gap 11 5 - 15    Comment: Performed at [facility] [place] [id number]     Status: None   Collection Time: 02/19/19  4:47 AM  Result Value Ref Range   Magnesium 2.3 1.7 - 2.4 mg/dL    Comment: Performed at [facility] [place]Glucose, capillary     Status: None   Collection Time: 02/19/19  4:45 PM  Result Value Ref Range   Glucose-Capillary 94 70 - 99 mg/dL  Glucose, capillary     Status: None   Collection Time: 02/19/19  8:58 PM  Result Value Ref Range   Glucose-Capillary 96 70 - 99 mg/dL  .Cooxemetry Panel (carboxy, met, total hgb, O2 sat)     Status: None   Collection Time: 02/20/19  4:05 AM  Result Value Ref Range   Total hemoglobin 13.5 12.0 - 16.0 g/dL   O2 Saturation 73.4 %   Carboxyhemoglobin 1.4 0.5 - 1.5 %   Methemoglobin 0.5 0.0 - 1.5 %  Protime-INR     Status: Abnormal   Collection Time: 02/20/19  4:16 AM  Result Value Ref Range   Prothrombin Time 25.2 (H) 11.4 - 15.2 seconds   INR 2.3 (H) 0.8 - 1.2    Comment: (NOTE) INR goal varies based on device and disease states. Performed at [facility], [place] [address], [place], [place] [address]   CBC     Status: Abnormal   Collection Time: 02/20/19  4:16 AM  Result Value Ref Range   WBC 7.5 4.0 - 10.5 K/uL   RBC 4.22 4.22 - 5.81 MIL/uL   Hemoglobin 13.2 13.0 - 17.0 g/dL   HCT 42.3 39.0 - 52.0 %   MCV 100.2 (H) 80.0 - 100.0 fL   MCH 31.3 26.0 - 34.0 pg   MCHC 31.2 30.0 - 36.0 g/dL   RDW 16.5 (H) 11.5 - 15.5 %   Platelets 209 150 - 400 K/uL   nRBC 0.0 0.0 - 0.2 %    Comment: Performed at [facility], [place] [address],  [place], [place] [address]  Magnesium     Status: None   Collection Time: 02/20/19  4:16 AM  Result Value Ref Range   Magnesium 2.4 1.7 - 2.4 mg/dL    Comment: Performed at [place] [address] [place], [place] [address]  Glucose, capillary     Status: Abnormal   Collection Time: 02/20/19  8:15 AM  Result Value Ref Range   Glucose-Capillary 102 (H) 70 - 99 mg/dL  Basic metabolic panel     Status: Abnormal   Collection Time: 02/20/19  9:15 AM  Result Value Ref Range   Sodium 141 135 - 145 mmol/L   Potassium 3.2 (L) 3.5 - 5.1 mmol/L   Chloride 100 98 - 111 mmol/L   CO2 29 22 - 32 mmol/L   Glucose, Bld 107 (H) 70 - 99 mg/dL   BUN 59 (H) 8 - 23 mg/dL   Creatinine, Ser 3.55 (H) 0.61 - 1.24 mg/dL   Calcium 9.1 8.9 - 10.3 mg/dL   GFR calc non Af Amer 17 (L) >60 mL/min   GFR calc Af Amer 20 (L) >60 mL/min   Anion gap 12 5 - 15    Comment: Performed at [facility] [address], [place], [place] [address]  Glucose, capillary     Status: Abnormal   Collection Time: 02/20/19 11:23 AM  Result Value Ref Range   Glucose-Capillary 146 (H) 70 - 99 mg/dL     ROS:  A comprehensive review of systems was negative except for: Constitutional: positive for fatigue Cardiovascular: positive for lower extremity edema  Physical Exam: Vitals:   02/20/19 0821 02/20/19 1127  BP: 98/69 97/72  Pulse: 70 83  Resp:    Temp: 98.5 F (36.9 C) (!) 97.4 F (36.3 C)  SpO2: 91% 94%  General: Pleasant black male sitting on the side of the bed in no acute distress-his mobility is not good HEENT: Pupils equally round and reactive to light, extraocular motions are intact, mucous membranes are moist Neck: Positive for JVD Heart: Regular rate rhythm Lungs: Mostly clear bilaterally Abdomen: Obese, abdominal wall edema Extremities: Significant pitting lower extremity edema up to the thighs Skin: Warm and dry Neuro: Alert and nonfocal  Assessment/Plan: 63 year old black male with a  significant cardiomyopathy and fairly advanced CKD now in trouble with volume overload 1.Renal-patient with moderate CKD at baseline with creatinine in the high twos.  Has suffered acute on chronic renal failure during the course of this hospitalization with appropriate diuresis but also some borderline low blood pressures.  I suspect this injury is hemodynamically mediated.  I agree that I think the patient is still volume overloaded.  I will attempt to modify medications somewhat to try to retain renal perfusion in the setting of attempted diuresis.  I have chosen to start midodrine, decrease sildenafil slightly and will try to max out oral diuretics at torsemide 100 twice daily with metolazone 10 daily.  This will either be effective with diuresis and kidney function will remain stable or improve, or renal function will continue to worsen.  In that case there may be a role for dialysis.  I have told patient that cardiomyopathy can make dialysis much more difficult-even not doable in some situations but we would not know until we tried.  He seems very willing to utilize dialysis if and when it may be needed 2. Hypertension/volume  -volume overloaded as above.  Medication changes also as above 3. Anemia  -is not to a level that requires treatment at this point 4.  Hypokalemia-continue to replete as needed-status post 40 mEq today   Louis Meckel 02/20/2019, 3:29 PM

## 2019-02-20 NOTE — Telephone Encounter (Signed)
Advanced Heart Failure Patient Advocate Encounter  Prior Authorization for Sildenafil 20mg  has been approved.    PA#  [id number] Effective dates: 02/17/2019 through 05/04/2019  Patients co-pay is $3.90

## 2019-02-20 NOTE — Progress Notes (Addendum)
Patient ID: [name], male   DOB: [birth year], 63 y.o.   MRN: [id number]     Advanced Heart Failure Rounding Note  PCP-Cardiologist: Minus Breeding, MD  [facility]: Dr. Haroldine Laws   Subjective:    RHC on milrinone 0.125 RA = 16 RV = 63/18 PA = 66/29 (47) PCW = 11 Fick cardiac output/index = 5.6/2.3 Thermo CO/CI = 4.6/1.9 PVR = 6.5 (Fick) 7.9 (therm) FA sat = 96% PA sat = 62%, 62%  Hemodynamics most c/w with PAH and RV failure.   Milrinone gtt stopped with very frequent ventricular ectopy.    He is on Lasix gtt 20 mg/hr + acetazolamide 250 tid + metolazone 5 mg. CO-OX 73%. Weight down another 5 pounds.   Denies SOB.    Objective:   Weight Range: 122.5 kg Body mass index is 38.76 kg/m.   Vital Signs:   Temp:  [97.7 F (36.5 C)-98.5 F (36.9 C)] 98.5 F (36.9 C) (10/19 0821) Pulse Rate:  [56-88] 70 (10/19 0821) Resp:  [20] 20 (10/19 0357) BP: (94-120)/(69-86) 98/69 (10/19 0821) SpO2:  [91 %-100 %] 91 % (10/19 0821) Weight:  [122.5 kg] 122.5 kg (10/19 0352) Last BM Date: 02/18/19  Weight change: Filed Weights   02/18/19 0447 02/19/19 0335 02/20/19 0352  Weight: 127.1 kg 124.9 kg 122.5 kg    Intake/Output:   Intake/Output Summary (Last 24 hours) at 02/20/2019 0852 Last data filed at 02/20/2019 0819 Gross per 24 hour  Intake 1100 ml  Output 1225 ml  Net -125 ml      Physical Exam   CVP 7-8  General:  Well appearing. No resp difficulty HEENT: normal Neck: supple. no JVD. Carotids 2+ bilat; no bruits. No lymphadenopathy or thryomegaly appreciated. RIJ  Cor: PMI nondisplaced. Regular rate & rhythm. No rubs, gallops or murmurs. Lungs: clear on 2 liters.  Abdomen: soft, nontender, nondistended. No hepatosplenomegaly. No bruits or masses. Good bowel sounds. Extremities: no cyanosis, clubbing, rash, R and LLE unna boots. 1+ edema above unna boots. Neuro: alert & orientedx3, cranial nerves grossly intact. moves all 4 extremities w/o difficulty. Affect pleasant  .    Telemetry   NSR with PVCS NSVT 80s    EKG    N/A  Labs    CBC Recent Labs    02/19/19 0447 02/20/19 0416  WBC 6.7 7.5  HGB 12.3* 13.2  HCT 40.0 42.3  MCV 101.5* 100.2*  PLT 182 665   Basic Metabolic Panel Recent Labs    02/18/19 0517 02/19/19 0447 02/20/19 0416  NA 139 140  --   K 4.0 3.4*  --   CL 101 100  --   CO2 28 29  --   GLUCOSE 108* 106*  --   BUN 58* 55*  --   CREATININE 3.07* 3.01*  --   CALCIUM 9.0 9.1  --   MG 2.2 2.3 2.4   Liver Function Tests No results for input(s): AST, ALT, ALKPHOS, BILITOT, PROT, ALBUMIN in the last 72 hours. No results for input(s): LIPASE, AMYLASE in the last 72 hours. Cardiac Enzymes No results for input(s): CKTOTAL, CKMB, CKMBINDEX, TROPONINI in the last 72 hours.  BNP: BNP (last 3 results) Recent Labs    07/19/18 1106 01/02/19 1552 02/13/19 1114  BNP 1,608.5* 2,154.7* 2,637.3*    ProBNP (last 3 results) Recent Labs    11/01/18 1229  PROBNP 2,158.0*     D-Dimer No results for input(s): DDIMER in the last 72 hours. Hemoglobin A1C No results for input(s):  HGBA1C in the last 72 hours. Fasting Lipid Panel No results for input(s): CHOL, HDL, LDLCALC, TRIG, CHOLHDL, LDLDIRECT in the last 72 hours. Thyroid Function Tests No results for input(s): TSH, T4TOTAL, T3FREE, THYROIDAB in the last 72 hours.  Invalid input(s): FREET3  Other results:   Imaging    No results found.   Medications:     Scheduled Medications: . acetaZOLAMIDE  250 mg Oral TID  . amiodarone  200 mg Oral BID  . atorvastatin  80 mg Oral q1800  . Chlorhexidine Gluconate Cloth  6 each Topical Daily  . potassium chloride  40 mEq Oral Daily  . sildenafil  20 mg Oral TID  . sodium chloride flush  3 mL Intravenous Q12H  . sodium chloride flush  3 mL Intravenous Q12H  . sodium chloride flush  3 mL Intravenous Q12H  . Warfarin - Pharmacist Dosing Inpatient   Does not apply q1800    Infusions: . sodium chloride    . sodium  chloride    . furosemide (LASIX) infusion 20 mg/hr (02/20/19 0606)    PRN Medications: sodium chloride, sodium chloride, acetaminophen, loperamide, ondansetron (ZOFRAN) IV, sodium chloride flush, sodium chloride flush    Patient Profile   63 y/o male with severe systolic HF with biventricular dysfunction due to NICM and advanced CKD. Recently hospitalized with low output HF. Now returns with progressive NYHA IV symptoms with marked fluid overload not responding to titration of oral diuretic regimen. With CKD 4, biventricular HF and morbid obesity advanced options are quite limited.  Assessment/Plan    1. Acute on Chronic Systolic HF with biventricular dysfunction due to NICM: mild nonobstructive CAD on [facility] 12/2018. CM out of proportion of CAD. PYP scan and myeloma panel completed and were not suggestive of cardiac amyloidosis. S/p St Jude ICD. ECHO 07/28/2017 EF 20-25%. RV mildly dilated. Echo 11/17/18 EF 20-25% RV dilated and severely HK with septal flattening. Presented to ED 10/12 w/ NYHA IV symptoms and markedly volume overload w/ 3+ bilateral LEE, left basilar rales and elevated JVD to level of ear. Admit Wt up 10+ lb from home baseline. BNP 2600 on admit.  -Poor initial response to high dose IV diuretics w/ increase in SCr from  2.76>>3.30, prompting initiation of inotropic support, started on milrinone. Milrinone later stopped due to ventricular ectopy.  - [place] 10/15 with R sided HF and PAH.  - CO-OX 73%. Stable off milrinone.  -Weight down another 5 pounds.  Stop lasix drip, diamox, metolazone.  - Anticipate starting torsemide 80 mg twice a day.   - In future, may need to consider HD for volume removal.  - continue unna boots  - ? blocker (coreg) on hold due to low output -Off digoxin due to AKI -Hold bidil for now given soft BP -Off spiro and ACE/ARB due to CKD/AKI -He continues to deteriorate with biventricular dysfunction/PAH and progressive CKD. Only real option seems to be  consideration of heart/kidney transplant.  - Needs palliative care consult to discus GOC.   2. CKD stage 3 - Baseline creatinine ~1.9.   Cr 2.8 -> 2.4-> 2.7-> 3.4 -> 2.97->2.76->2.8 -> 3.07 -> 3.01-> pending.  - Suspect cardiorenal syndrome - Marked volume overload, as above if unable to diurese will have to consider HD.   3. CAD - LHC8/20with LAD 20%50% distal left circ and 20% mid RCA stenosis. No significant disease in left main or LAD. - No s/s ischemia. Hs Trop 284 but likely demand ischemia in the setting of a/c  CHF and abnormal renal function.   - Continue statin. - Hold ? blocker due to soft BP/ concern for low output - No ASA due to chronic coumadin  4. PAF  -s/p recent DC-CV for AFL.  - Continue amio 200 mg bid, he is in NSR.    - On coumadin, followed by coumadin clinic on [place].    6. [place] ICD.  -Had VT episode (rate 190 bpm) w/ delivery of shock on 02/07/19  -Started on IV amiodarone 10/14 w/ improvement, NSVT less frequent off milrinone.  - Continue amiodarone 200 mg po twice a day.  -Keep K >4.0 and Mg > 2.0 . - K pending. Mag stable.   7. Pulmonary HTN - On home O2 (2L).  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 Had negative V/Q scan ruling out CTEPH.  - RHC PVR 6.5. Started on sildenafil 20 mg three times a day.  - We are working of PA for sildenafil.   8. Hypokalemia: BMET pending.   Waiting on BMET. CVP down to 7-8 . Hold diamox/metolazone/lasix drip.  HH ordered for d/c    Length of Stay: 7  Amy Clegg, NP  02/20/2019, 8:52 AM  Advanced Heart Failure Team Pager [phone] (M-F; [place])  Please contact [facility] for night-coverage after hours (4p -7a ) and weekends on [email or web address]  Patient seen and examined with the above-signed Advanced Practice Provider and/or Housestaff. I personally reviewed laboratory data, imaging studies and relevant notes. I independently examined the patient and formulated the  important aspects of the plan. I have edited the note to reflect any of my changes or salient points. I have personally discussed the plan with the patient and/or family.  Volume status improved but weight just back to admit weight and still looks a bit volume overloaded. CVP 7-8. If renal function permits I would like to try to push diuresis at least one more day. Await BMET. Continue sildenafil. If creatinine up significantly can d/c home on torsemide 80 bid. Suspect we are heading toward HD in the near future for volume control. Ventricular ectopy has settled down off milrinone. Co-ox ok.    Glori Bickers, MD  9:56 AM   Addendum:  Creatinine up to 3.6. He remains markedly volume overloaded. I have asked Renal to see him and help with diuresis.   Glori Bickers, MD  1:21 PM

## 2019-02-20 NOTE — Progress Notes (Signed)
ANTICOAGULATION CONSULT NOTE - Follow Up Consult  Pharmacy Consult for Warfarin Indication: atrial fibrillation  No Known Allergies  Patient Measurements: Height: 5\' 10"  (177.8 cm) Weight: 270 lb 1.6 oz (122.5 kg) IBW/kg (Calculated) : 73  Vital Signs: Temp: 98.5 F (36.9 C) (10/19 0821) Temp Source: Oral (10/19 0821) BP: 98/69 (10/19 0821) Pulse Rate: 70 (10/19 0821)  Labs: Recent Labs    02/18/19 0517 02/19/19 0447 02/20/19 0416  HGB 12.6* 12.3* 13.2  HCT 40.5 40.0 42.3  PLT 165 182 209  LABPROT 28.4* 26.1* 25.2*  INR 2.7* 2.4* 2.3*  CREATININE 3.07* 3.01*  --     Estimated Creatinine Clearance: 33.4 mL/min (A) (by C-G formula based on SCr of 3.01 mg/dL (H)).   Medical History: Past Medical History:  Diagnosis Date  . CAD (coronary artery disease)    a. Initial nonobst by cath 2009. b. Cath 01/2013 in setting of VT storm: obstructive distal Cx disease (small and terminates in the AV groove, unlikely to cause significant ischemia or electrical instability), nonobstructive RCA disease, EF 15-20%.   . Cerebrovascular accident [facility])    a. 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 Chronic systolic CHF (congestive heart failure) ([place])    a. Likely NICM (out of proportion to CAD). b. 2009 - EF 25-30% by echo, 01/2013: 15-20% by cath. c. 11/2014 Echo: EF 35-40%, Gr1 DD, mild MR, mod TR, PASP 46mmHg.  . CKD (chronic kidney disease), stage II   . Dyslipidemia   . Gout   . HTN (hypertension)   . Hypokalemia   . ICD (implantable cardiac defibrillator) in place   . Insulin dependent diabetes mellitus   . Lipoma   . Nonischemic cardiomyopathy ([place])    a.  11/2014 Echo: EF 35-40%, Gr1 DD, mild MR, mod TR, PASP 64mmHg.  . OSA (obstructive sleep apnea)    does not wear cpap  . PAF (paroxysmal atrial fibrillation) ([place])    a. Noted 05/2008 by EKG;  b. CHA2DS2VASc = 5-6-->coumadin.  . Paroxysmal VT ([place])    a. s/p St. Jude ICD 2007. b. H/o paroxysmal VT/VF including VT storm 12/2012  admission prompting amio initiation;  c. 01/2013 ICD upgrade SJM 1411-36Q Ellipse VR single lead ICD.  [name] Pulmonary HTN ([place])    a. Mild by cath 01/2013.    Medications:  Scheduled:  . acetaZOLAMIDE  250 mg Oral TID  . amiodarone  200 mg Oral BID  . atorvastatin  80 mg Oral q1800  . Chlorhexidine Gluconate Cloth  6 each Topical Daily  . potassium chloride  40 mEq Oral Daily  . sildenafil  20 mg Oral TID  . sodium chloride flush  3 mL Intravenous Q12H  . sodium chloride flush  3 mL Intravenous Q12H  . sodium chloride flush  3 mL Intravenous Q12H  . warfarin  4 mg Oral ONCE-1800  . Warfarin - Pharmacist Dosing Inpatient   Does not apply q1800   Infusions:  . sodium chloride    . sodium chloride    . furosemide (LASIX) infusion 20 mg/hr (02/20/19 0606)    Assessment: Pt is a 63 y/o male with PMH of CHF (EF 20-25%), prior VT s/p St Jude ICD, CAD, CVA, OSA on CPAP, pulmonary HTN, HTN, DM, CKDs stage III, and PAF on coumadin PTA (5mg  daily, except 2.5 mg on fridays). Pt presents to the ED with volume overload diuresing with IV furosemide. Pharmacy has been consulted to dose warfarin.  Patient is s/p RHC 10/15.  Patient's INR today is within goal, 2.3.  CBC is stable. No s/sx of bleeding per notes.  Goal of Therapy:  INR 2-3 Monitor platelets by anticoagulation protocol: Yes   Plan:  Give warfarin 4 mg x 1 tonight Monitor for signs/symptoms of bleeding Check daily INR, CBC  Kennon Holter, PharmD PGY1 Ambulatory Care Pharmacy Resident [employer] Phone: [phone]

## 2019-02-20 NOTE — Consult Note (Signed)
Consultation Note Date: 02/20/2019   Patient Name: [name]  DOB: 1955-08-21  MRN: [id number]  Age / Sex: 63 y.o., male  PCP: Biagio Borg, MD Referring Physician: Jolaine Artist, MD  Reason for Consultation: Establishing goals of care and Psychosocial/spiritual support  HPI/Patient Profile: 63 y.o. male  admitted on 02/13/2019 with a past medical  history oflongstandingcardiomyopathy out of proportion to CAD/chronic biventricular CHF(EF 20-25% 07/2017), prior VT,s/p St Jude ICD, CAD,CVA,OSA on CPAP, pulmonary HTN on O2 at home, PAFon coumadin,HTN, DM,and CKD stage 3.  Admitted from Dr Hochrein's office 10/01/17 with volume overload. HF team was consulted.  Underwent R/LHC 10/04/17 - full report below. PYP scan and myeloma panel completed and were not suggestive of cardiac amyloidosis. V/Q scan completed for elevated PA pressures on cath, which was negative. He diuresed 31 lbs with IV lasix and metolazone and transitioned to torsemide 60 mg daily. HF meds were optimized.   Echo7/16/20: EF20-25% RV dilated and severely HK with septal flattening.  With CKD 4, biventricular HF and morbid obesity advanced options are quite limited  Patient faces treatment option decisions, advanced directive decisions and anticipatory care needs.   Clinical Assessment and Goals of Care:  This NP Wadie Lessen reviewed medical records, received report from team, assessed the patient and then meet at the patient's bedside   to discuss diagnosis, prognosis, GOC,  and options.  Concept of  Palliative Care was discussed   Created space and opportunity for patient to explore his thoughts and feelings regarding his current medical situation.  He verbalizes an understanding of the seriousness of the situation.  He tells me that he is open to all offered and available medical interventions to prolong life and in  fact that he has been in discussion with the heart failure team regarding consideration for heart and kidney transplant at [facility].  I discussed the importance of advanced care planning  Concepts specific to code status, artifical feeding and hydration, continued IV antibiotics and rehospitalization was had.  The difference between a aggressive medical intervention path  and a palliative comfort care path for this patient at this time was had.  Values and goals of care important to patient and family were attempted to be elicited.   Questions and concerns addressed.   Family encouraged to call with questions or concerns.    PMT will continue to support holistically.    No documented healthcare power of attorney or advanced directives.  Inform patient that this could be completed with the support of spiritual care department while he is here in the hospital.     SUMMARY OF RECOMMENDATIONS    Code Status/Advance Care Planning:  Full code  Psycho-social/Spiritual:   Desire for further Chaplaincy support:yes   Prognosis:   Unable to determine  Discharge Planning: To Be Determined      Primary Diagnoses: Present on Admission: . Acute on chronic combined systolic and diastolic HF (heart failure), NYHA class 3 ([place])   I have reviewed the medical record, interviewed the  patient and family, and examined the patient. The following aspects are pertinent.  Past Medical History:  Diagnosis Date  . CAD (coronary artery disease)    a. Initial nonobst by cath 2009. b. Cath 01/2013 in setting of VT storm: obstructive distal Cx disease (small and terminates in the AV groove, unlikely to cause significant ischemia or electrical instability), nonobstructive RCA disease, EF 15-20%.   . Cerebrovascular accident [facility])    a. 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 Chronic systolic CHF (congestive heart failure) ([place])    a. Likely NICM (out of proportion to CAD). b. 2009 - EF 25-30% by echo, 01/2013:  15-20% by cath. c. 11/2014 Echo: EF 35-40%, Gr1 DD, mild MR, mod TR, PASP 63mmHg.  . CKD (chronic kidney disease), stage II   . Dyslipidemia   . Gout   . HTN (hypertension)   . Hypokalemia   . ICD (implantable cardiac defibrillator) in place   . Insulin dependent diabetes mellitus   . Lipoma   . Nonischemic cardiomyopathy ([place])    a.  11/2014 Echo: EF 35-40%, Gr1 DD, mild MR, mod TR, PASP 2mmHg.  . OSA (obstructive sleep apnea)    does not wear cpap  . PAF (paroxysmal atrial fibrillation) ([place])    a. Noted 05/2008 by EKG;  b. CHA2DS2VASc = 5-6-->coumadin.  . Paroxysmal VT ([place])    a. s/p St. Jude ICD 2007. b. H/o paroxysmal VT/VF including VT storm 12/2012 admission prompting amio initiation;  c. 01/2013 ICD upgrade SJM 1411-36Q Ellipse VR single lead ICD.  [name] Pulmonary HTN ([place])    a. Mild by cath 01/2013.   Social History   Socioeconomic History  . Marital status: Married    Spouse name: Not on file  . Number of children: 2  . Years of education: Not on file  . Highest education level: Not on file  Occupational History  . Occupation: medically retired due to heart failure  Social Needs  . Financial resource strain: Not on file  . Food insecurity    Worry: Not on file    Inability: Not on file  . Transportation needs    Medical: Yes    Non-medical: No  Tobacco Use  . Smoking status: Former Smoker    Quit date: 05/04/1986    Years since quitting: 32.8  . Smokeless tobacco: Never Used  . Tobacco comment: Quit smoking 30 yrs ago. Smoked as teenager less than 1/2 ppd. Smoked x 4 years.  Substance and Sexual Activity  . Alcohol use: No    Comment: occasionally  . Drug use: No  . Sexual activity: Never  Lifestyle  . Physical activity    Days per week: 0 days    Minutes per session: 0 min  . Stress: Not at all  Relationships  . Social Herbalist on phone: Twice a week    Gets together: Never    Attends religious service: Never    Active member of club or  organization: No    Attends meetings of clubs or organizations: Never    Relationship status: Married  Other Topics Concern  . Not on file  Social History Narrative  . Not on file   Family History  Problem Relation Age of Onset  . Coronary artery disease Mother   . Heart attack Mother   . Heart attack Maternal Uncle   . Heart attack Maternal Grandmother    Scheduled Meds: . amiodarone  200 mg Oral BID  .  atorvastatin  80 mg Oral q1800  . Chlorhexidine Gluconate Cloth  6 each Topical Daily  . potassium chloride  20 mEq Oral Once  . potassium chloride  40 mEq Oral Daily  . sildenafil  20 mg Oral TID  . sodium chloride flush  3 mL Intravenous Q12H  . sodium chloride flush  3 mL Intravenous Q12H  . sodium chloride flush  3 mL Intravenous Q12H  . warfarin  4 mg Oral ONCE-1800  . Warfarin - Pharmacist Dosing Inpatient   Does not apply q1800   Continuous Infusions: . sodium chloride    . sodium chloride     PRN Meds:.sodium chloride, sodium chloride, acetaminophen, loperamide, ondansetron (ZOFRAN) IV, sodium chloride flush, sodium chloride flush Medications Prior to Admission:  Prior to Admission medications   Medication Sig Start Date End Date Taking? Authorizing Provider  amiodarone (PACERONE) 200 MG tablet Take 1 tablet (200 mg total) by mouth 2 (two) times daily. Patient taking differently: Take 200 mg by mouth daily.  01/05/19  Yes Swayze, Ava, DO  atorvastatin (LIPITOR) 80 MG tablet Take 80 mg by mouth daily at 6 PM.   Yes [provider]  carvedilol (COREG) 3.125 MG tablet Take 1 tablet (3.125 mg total) by mouth 2 (two) times daily with a meal. 02/03/19  Yes Biagio Borg, MD  Insulin Glargine (LANTUS SOLOSTAR) 100 UNIT/ML Solostar Pen Inject 10 units into the skin at bedtime   Yes [provider]  Insulin Pen Needle (B-D ULTRAFINE III SHORT PEN) 31G X 8 MM MISC USE ONCE DAILY WITH INSULIN 01/14/18  Yes Biagio Borg, MD  Insulin Pen Needle (PEN NEEDLES) 31G X  5 MM MISC 100 each by Does not apply route 2 (two) times daily. 11/12/17  Yes Shambley, Delphia Grates, NP  isosorbide-hydrALAZINE (BIDIL) 20-37.5 MG tablet Take 0.5 tablets by mouth 3 (three) times daily. 01/12/19  Yes Bensimhon, Shaune Pascal, MD  nitroGLYCERIN (NITROSTAT) 0.4 MG SL tablet Place 0.4 mg under the tongue every 5 (five) minutes as needed for chest pain.   Yes [provider]  OXYGEN Inhale into the lungs. 2L continuous; 3L on pulse machine   Yes [provider]  torsemide (DEMADEX) 20 MG tablet Take 2 tablets (40 mg total) by mouth daily. 01/06/19  Yes Swayze, Ava, DO  Turmeric 500 MG CAPS Take 1,000 mg by mouth daily.   Yes [provider]  warfarin (COUMADIN) 5 MG tablet Take 2.5-5 mg by mouth See admin instructions. 5mg  everyday except Friday; 2.5mg  on fridays   Yes [provider]   No Known Allergies Review of Systems  Constitutional: Positive for fatigue.    Physical Exam Cardiovascular:     Rate and Rhythm: Normal rate and regular rhythm.  Skin:    General: Skin is warm and dry.  Neurological:     Mental Status: He is alert.     Vital Signs: BP 98/69 (BP Location: Left Arm)   Pulse 70   Temp 98.5 F (36.9 C) (Oral)   Resp 20   Ht 5\' 10"  (1.778 m)   Wt 122.5 kg   SpO2 91%   BMI 38.76 kg/m  Pain Scale: 0-10 POSS *See Group Information*: 1-Acceptable,Awake and alert Pain Score: 0-No pain   SpO2: SpO2: 91 % O2 Device:SpO2: 91 % O2 Flow Rate: .O2 Flow Rate (L/min): 3 L/min  IO: Intake/output summary:   Intake/Output Summary (Last 24 hours) at 02/20/2019 1002 Last data filed at 02/20/2019 0819 Gross per 24  hour  Intake 920 ml  Output 1225 ml  Net -305 ml    LBM: Last BM Date: 02/18/19 Baseline Weight: Weight: 122.5 kg Most recent weight: Weight: 122.5 kg     Palliative Assessment/Data:   PMT will continue to support holistically   Time In  1000 Time Out: 1115 Time Total: 75  minutes Greater than 50%  of this time  was spent counseling and coordinating care related to the above assessment and plan.  Signed by: Wadie Lessen, NP   Please contact Palliative Medicine Team phone at [phone] for questions and concerns.  For individual provider: See Shea Evans

## 2019-02-20 NOTE — Care Management Important Message (Signed)
Important Message  Patient Details  Name: [name] MRN: [id number] Date of Birth: 19-Dec-1955   Medicare Important Message Given:  Yes     Shelda Altes 02/20/2019, 1:45 PM

## 2019-02-20 NOTE — Progress Notes (Signed)
1035 Pt with palliative. Pt stated does not want to walk today. Will continue to follow.  Graylon Good RN BSN 02/20/2019 10:43 AM

## 2019-02-21 DIAGNOSIS — I5043 Acute on chronic combined systolic (congestive) and diastolic (congestive) heart failure: Secondary | ICD-10-CM | POA: Diagnosis not present

## 2019-02-21 DIAGNOSIS — I472 Ventricular tachycardia: Secondary | ICD-10-CM | POA: Diagnosis not present

## 2019-02-21 LAB — CBC
HCT: 38.5 % — ABNORMAL LOW (ref 39.0–52.0)
Hemoglobin: 12.3 g/dL — ABNORMAL LOW (ref 13.0–17.0)
MCH: 31.6 pg (ref 26.0–34.0)
MCHC: 31.9 g/dL (ref 30.0–36.0)
MCV: 99 fL (ref 80.0–100.0)
Platelets: 176 10*3/uL (ref 150–400)
RBC: 3.89 MIL/uL — ABNORMAL LOW (ref 4.22–5.81)
RDW: 16.6 % — ABNORMAL HIGH (ref 11.5–15.5)
WBC: 6.7 10*3/uL (ref 4.0–10.5)
nRBC: 0 % (ref 0.0–0.2)

## 2019-02-21 LAB — COOXEMETRY PANEL
Carboxyhemoglobin: 1.3 % (ref 0.5–1.5)
Methemoglobin: 0.6 % (ref 0.0–1.5)
O2 Saturation: 67.1 %
Total hemoglobin: 12.4 g/dL (ref 12.0–16.0)

## 2019-02-21 LAB — BASIC METABOLIC PANEL
Anion gap: 11 (ref 5–15)
BUN: 66 mg/dL — ABNORMAL HIGH (ref 8–23)
CO2: 30 mmol/L (ref 22–32)
Calcium: 9 mg/dL (ref 8.9–10.3)
Chloride: 98 mmol/L (ref 98–111)
Creatinine, Ser: 3.84 mg/dL — ABNORMAL HIGH (ref 0.61–1.24)
GFR calc Af Amer: 18 mL/min — ABNORMAL LOW (ref >60)
GFR calc non Af Amer: 16 mL/min — ABNORMAL LOW (ref >60)
Glucose, Bld: 107 mg/dL — ABNORMAL HIGH (ref 70–99)
Potassium: 3.2 mmol/L — ABNORMAL LOW (ref 3.5–5.1)
Sodium: 139 mmol/L (ref 135–145)

## 2019-02-21 LAB — GLUCOSE, CAPILLARY
Glucose-Capillary: 94 mg/dL (ref 70–99)
Glucose-Capillary: 111 mg/dL — ABNORMAL HIGH (ref 70–99)
Glucose-Capillary: 104 mg/dL — ABNORMAL HIGH (ref 70–99)
Glucose-Capillary: 95 mg/dL (ref 70–99)

## 2019-02-21 LAB — PROTIME-INR
INR: 2.3 — ABNORMAL HIGH (ref 0.8–1.2)
Prothrombin Time: 25.1 seconds — ABNORMAL HIGH (ref 11.4–15.2)

## 2019-02-21 LAB — MAGNESIUM: Magnesium: 2.4 mg/dL (ref 1.7–2.4)

## 2019-02-21 MED ORDER — POTASSIUM CHLORIDE CRYS ER 20 MEQ PO TBCR
40.0000 meq | EXTENDED_RELEASE_TABLET | Freq: Two times a day (BID) | ORAL | Status: DC
  Administered 2019-02-22 – 2019-02-23 (×3): 40 meq via ORAL
  Filled 2019-02-21 (×3): qty 2

## 2019-02-21 MED ORDER — WARFARIN SODIUM 4 MG PO TABS
4.0000 mg | ORAL_TABLET | Freq: Once | ORAL | Status: AC
  Administered 2019-02-21: 17:00:00 4 mg via ORAL
  Filled 2019-02-21: qty 1

## 2019-02-21 MED ORDER — POTASSIUM CHLORIDE CRYS ER 20 MEQ PO TBCR
40.0000 meq | EXTENDED_RELEASE_TABLET | Freq: Once | ORAL | Status: AC
  Administered 2019-02-21: 40 meq via ORAL
  Filled 2019-02-21: qty 2

## 2019-02-21 NOTE — Progress Notes (Signed)
Orthopedic Tech Progress Note Patient Details:  [name] Dec 09, 1955 [id number] After RN cleaned legs I applied a little lotion quick leg massage then applied new unna boots  Ortho Devices Type of Ortho Device: Haematologist Ortho Device/Splint Location: bilateral Ortho Device/Splint Interventions: Adjustment, Application, Ordered   Post Interventions Patient Tolerated: Well Instructions Provided: Care of device, Adjustment of device   [name] 02/21/2019, 12:21 PM

## 2019-02-21 NOTE — Progress Notes (Addendum)
Patient ID: [name], male   DOB: 1956/01/19, 63 y.o.   MRN: [id number]     Advanced Heart Failure Rounding Note  PCP-Cardiologist: Minus Breeding, MD  [facility]: Dr. Haroldine Laws   Subjective:    RHC on milrinone 0.125 RA = 16 RV = 63/18 PA = 66/29 (47) PCW = 11 Fick cardiac output/index = 5.6/2.3 Thermo CO/CI = 4.6/1.9 PVR = 6.5 (Fick) 7.9 (therm) FA sat = 96% PA sat = 62%, 62%  Hemodynamics most c/w with PAH and RV failure.   Seen by Renal yesterday (thanks) put on high dose torsemide and metolazone. Weight down 1 pound creatinine now up to 3.8. Denies SOB, orthopnea or PND. Still edematous. Co-ox 67% CVP 12-13 (checked personally)   Objective:   Weight Range: 122.2 kg Body mass index is 38.64 kg/m.   Vital Signs:   Temp:  [97.4 F (36.3 C)-98.5 F (36.9 C)] 98 F (36.7 C) (10/20 0516) Pulse Rate:  [35-87] 71 (10/20 0532) BP: (97-102)/(55-74) 102/74 (10/20 0516) SpO2:  [91 %-100 %] 96 % (10/20 0532) Weight:  [122.2 kg] 122.2 kg (10/20 0516) Last BM Date: 02/19/19  Weight change: Filed Weights   02/19/19 0335 02/20/19 0352 02/21/19 0516  Weight: 124.9 kg 122.5 kg 122.2 kg    Intake/Output:   Intake/Output Summary (Last 24 hours) at 02/21/2019 0816 Last data filed at 02/21/2019 0643 Gross per 24 hour  Intake 961 ml  Output 1875 ml  Net -914 ml      Physical Exam   CVP 12-13 General:  Well appearing. No resp difficulty HEENT: normal Neck: supple. JVP to jaw  Carotids 2+ bilat; no bruits. No lymphadenopathy or thryomegaly appreciated. RIJ Cor: PMI nondisplaced. Regular rate & rhythm. No rubs, gallops. + TR . Lungs: clear Abdomen: soft, nontender, nondistended. No hepatosplenomegaly. No bruits or masses. Good bowel sounds. Extremities: no cyanosis, clubbing, rash, R and LLE 2-3+ edema unna boots.  Neuro: alert & orientedx3, cranial nerves grossly intact. moves all 4 extremities w/o difficulty. Affect pleasant   Telemetry   SR PVCs 70-80s personally  reviewed.    EKG    N/A  Labs    CBC Recent Labs    02/20/19 0416 02/21/19 0525  WBC 7.5 6.7  HGB 13.2 12.3*  HCT 42.3 38.5*  MCV 100.2* 99.0  PLT 209 267   Basic Metabolic Panel Recent Labs    02/20/19 0416 02/20/19 0915 02/21/19 0525  NA  --  141 139  K  --  3.2* 3.2*  CL  --  100 98  CO2  --  29 30  GLUCOSE  --  107* 107*  BUN  --  59* 66*  CREATININE  --  3.55* 3.84*  CALCIUM  --  9.1 9.0  MG 2.4  --  2.4   Liver Function Tests No results for input(s): AST, ALT, ALKPHOS, BILITOT, PROT, ALBUMIN in the last 72 hours. No results for input(s): LIPASE, AMYLASE in the last 72 hours. Cardiac Enzymes No results for input(s): CKTOTAL, CKMB, CKMBINDEX, TROPONINI in the last 72 hours.  BNP: BNP (last 3 results) Recent Labs    07/19/18 1106 01/02/19 1552 02/13/19 1114  BNP 1,608.5* 2,154.7* 2,637.3*    ProBNP (last 3 results) Recent Labs    11/01/18 1229  PROBNP 2,158.0*     D-Dimer No results for input(s): DDIMER in the last 72 hours. Hemoglobin A1C No results for input(s): HGBA1C in the last 72 hours. Fasting Lipid Panel No results for input(s): CHOL, HDL,  LDLCALC, TRIG, CHOLHDL, LDLDIRECT in the last 72 hours. Thyroid Function Tests No results for input(s): TSH, T4TOTAL, T3FREE, THYROIDAB in the last 72 hours.  Invalid input(s): FREET3  Other results:   Imaging    No results found.   Medications:     Scheduled Medications: . amiodarone  200 mg Oral BID  . atorvastatin  80 mg Oral q1800  . Chlorhexidine Gluconate Cloth  6 each Topical Daily  . metolazone  10 mg Oral Daily  . midodrine  10 mg Oral TID WC  . potassium chloride  40 mEq Oral Daily  . sildenafil  20 mg Oral BID  . sodium chloride flush  3 mL Intravenous Q12H  . sodium chloride flush  3 mL Intravenous Q12H  . sodium chloride flush  3 mL Intravenous Q12H  . torsemide  100 mg Oral BID  . Warfarin - Pharmacist Dosing Inpatient   Does not apply q1800    Infusions: .  sodium chloride    . sodium chloride      PRN Medications: sodium chloride, sodium chloride, acetaminophen, loperamide, ondansetron (ZOFRAN) IV, sodium chloride flush, sodium chloride flush    Patient Profile   63 y/o male with severe systolic HF with biventricular dysfunction due to NICM and advanced CKD. Recently hospitalized with low output HF. Now returns with progressive NYHA IV symptoms with marked fluid overload not responding to titration of oral diuretic regimen. With CKD 4, biventricular HF and morbid obesity advanced options are quite limited.  Assessment/Plan    1. Acute on Chronic Systolic HF with biventricular dysfunction due to NICM: mild nonobstructive CAD on [facility] 12/2018. CM out of proportion of CAD. PYP scan and myeloma panel completed and were not suggestive of cardiac amyloidosis. S/p St Jude ICD. ECHO 07/28/2017 EF 20-25%. RV mildly dilated. Echo 11/17/18 EF 20-25% RV dilated and severely HK with septal flattening. Presented to ED 10/12 w/ NYHA IV symptoms and markedly volume overload w/ 3+ bilateral LEE, left basilar rales and elevated JVD to level of ear. Admit Wt up 10+ lb from home baseline. BNP 2600 on admit.  -Poor initial response to high dose IV diuretics w/ increase in SCr from  2.76>>3.30, prompting initiation of inotropic support, started on milrinone. Milrinone later stopped due to ventricular ectopy.  - [place] 10/15 with R sided HF and PAH.   Stable off milrinone.  -Volume status trending down but creatinine trending up.  - Still edematous CVP 12-13 - Continue torsemide 100 mg twice a day + metolazone.   - In future, may need to consider HD for volume removal.  - Await further Renal recs. Agree with BP support.  - continue unna boots  - ? blocker (coreg) on hold due to low output -Off digoxin due to AKI -Hold bidil for now given soft BP -Off spiro and ACE/ARB due to CKD/AKI -He continues to deteriorate with biventricular dysfunction/PAH and progressive  CKD. Only real option seems to be consideration of heart/kidney transplant but Body mass index is 38.64 kg/m. which is prohibitive - Needs palliative care consult to discus GOC.   2. CKD stage 3 - Baseline creatinine ~1.9.   Cr 2.8 -> 2.4-> 2.7-> 3.4 -> 2.97->2.76->2.8 -> 3.07 -> 3.01-> 3.5>3.8 .  - Suspect cardiorenal syndrome -Nephrology appreciated  3. CAD - LHC8/20with LAD 20%50% distal left circ and 20% mid RCA stenosis. No significant disease in left main or LAD. - No s/s ischemia. Hs Trop 284 but likely demand ischemia in the setting of a/c  CHF and abnormal renal function.   - Continue statin. - Hold ? blocker due to soft BP/ concern for low output - No ASA due to chronic coumadin  4. PAF  -s/p recent DC-CV for AFL.  - Continue amio 200 mg bid, he is in NSR.    - On coumadin, followed by coumadin clinic on [place].    6. [place] ICD.  -Had VT episode (rate 190 bpm) w/ delivery of shock on 02/07/19  -Started on IV amiodarone 10/14 w/ improvement, NSVT less frequent off milrinone.  - Continue amiodarone 200 mg po twice a day.  -Keep K >4.0 and Mg > 2.0 . - K pending. Mag stable.   7. Pulmonary HTN - On home O2 (2L).  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 Had negative V/Q scan ruling out CTEPH.  - RHC PVR 6.5. Started on sildenafil 20 mg three times a day.  - We are working of PA for sildenafil.   8. Hypokalemia: K low. Supp K BMET pending.     Length of Stay: [place], MD  02/21/2019, 8:16 AM  Advanced Heart Failure Team Pager [phone] (M-F; 7a - 4p)  Please contact [facility] for night-coverage after hours (4p -7a ) and weekends on [email or web address]

## 2019-02-21 NOTE — Progress Notes (Signed)
PT Cancellation Note  Patient Details Name: [name] MRN: [id number] DOB: 08-16-1955   Cancelled Treatment:    Reason Eval/Treat Not Completed: Patient declined, no reason specified Pt declined therapy services. "I promise I will get up and move around with you tomorrow." Will follow.   [name] 02/21/2019, 3:05 PM Wray Kearns, PT, DPT Acute Rehabilitation Services Pager [phone] Office [phone]

## 2019-02-21 NOTE — Progress Notes (Signed)
ANTICOAGULATION CONSULT NOTE - Follow Up Consult  Pharmacy Consult for Warfarin Indication: atrial fibrillation  No Known Allergies  Patient Measurements: Height: 5\' 10"  (177.8 cm) Weight: 269 lb 4.8 oz (122.2 kg) IBW/kg (Calculated) : 73  Vital Signs: Temp: 97.5 F (36.4 C) (10/20 0816) Temp Source: Oral (10/20 0816) BP: 106/80 (10/20 0816) Pulse Rate: 75 (10/20 0816)  Labs: Recent Labs    02/19/19 0447 02/20/19 0416 02/20/19 0915 02/21/19 0525  HGB 12.3* 13.2  --  12.3*  HCT 40.0 42.3  --  38.5*  PLT 182 209  --  176  LABPROT 26.1* 25.2*  --  25.1*  INR 2.4* 2.3*  --  2.3*  CREATININE 3.01*  --  3.55* 3.84*    Estimated Creatinine Clearance: 26.2 mL/min (A) (by C-G formula based on SCr of 3.84 mg/dL (H)).   Medical History: Past Medical History:  Diagnosis Date  . CAD (coronary artery disease)    a. Initial nonobst by cath 2009. b. Cath 01/2013 in setting of VT storm: obstructive distal Cx disease (small and terminates in the AV groove, unlikely to cause significant ischemia or electrical instability), nonobstructive RCA disease, EF 15-20%.   . Cerebrovascular accident [facility])    a. 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 Chronic systolic CHF (congestive heart failure) ([place])    a. Likely NICM (out of proportion to CAD). b. 2009 - EF 25-30% by echo, 01/2013: 15-20% by cath. c. 11/2014 Echo: EF 35-40%, Gr1 DD, mild MR, mod TR, PASP 73mmHg.  . CKD (chronic kidney disease), stage II   . Dyslipidemia   . Gout   . HTN (hypertension)   . Hypokalemia   . ICD (implantable cardiac defibrillator) in place   . Insulin dependent diabetes mellitus   . Lipoma   . Nonischemic cardiomyopathy ([place])    a.  11/2014 Echo: EF 35-40%, Gr1 DD, mild MR, mod TR, PASP 24mmHg.  . OSA (obstructive sleep apnea)    does not wear cpap  . PAF (paroxysmal atrial fibrillation) ([place])    a. Noted 05/2008 by EKG;  b. CHA2DS2VASc = 5-6-->coumadin.  . Paroxysmal VT ([place])    a. s/p St. Jude ICD 2007. b.  H/o paroxysmal VT/VF including VT storm 12/2012 admission prompting amio initiation;  c. 01/2013 ICD upgrade SJM [id number] [place] VR single lead ICD.  [name] Pulmonary HTN ([place])    a. Mild by cath 01/2013.    Medications:  Scheduled:  . amiodarone  200 mg Oral BID  . atorvastatin  80 mg Oral q1800  . Chlorhexidine Gluconate Cloth  6 each Topical Daily  . metolazone  10 mg Oral Daily  . midodrine  10 mg Oral TID WC  . potassium chloride  40 mEq Oral Daily  . sildenafil  20 mg Oral BID  . sodium chloride flush  3 mL Intravenous Q12H  . sodium chloride flush  3 mL Intravenous Q12H  . sodium chloride flush  3 mL Intravenous Q12H  . torsemide  100 mg Oral BID  . warfarin  4 mg Oral ONCE-1800  . Warfarin - Pharmacist Dosing Inpatient   Does not apply q1800   Infusions:  . sodium chloride    . sodium chloride      Assessment: Pt is a 63 y/o male with PMH of CHF (EF 20-25%), prior VT s/p St Jude ICD, CAD, CVA, OSA on CPAP, pulmonary HTN, HTN, DM, CKDs stage III, and PAF on coumadin PTA (5mg  daily, except 2.5 mg on fridays). Pt  presents to the ED with volume overload diuresing with IV furosemide. Pharmacy has been consulted to dose warfarin.  Patient is s/p RHC 10/15.    Patient's INR today remains within goal, 2.3.  CBC is stable. No s/sx of bleeding per notes.  Goal of Therapy:  INR 2-3 Monitor platelets by anticoagulation protocol: Yes   Plan:  Give warfarin 4 mg x 1 tonight Monitor for signs/symptoms of bleeding Check daily INR, CBC  Kennon Holter, PharmD PGY1 Ambulatory Care Pharmacy Resident [employer] Phone: [phone]

## 2019-02-21 NOTE — Progress Notes (Signed)
Subjective:  Diuresed 1200 with max meds-  BUN and crt up and K down but BP seems better (higher)  Objective Vital signs in last 24 hours: Vitals:   02/21/19 0516 02/21/19 0532 02/21/19 0816 02/21/19 1000  BP: 102/74  106/80 (!) 117/95  Pulse: 78 71 75 71  Resp:    18  Temp: 98 F (36.7 C)  (!) 97.5 F (36.4 C) 98.4 F (36.9 C)  TempSrc: Oral  Oral Oral  SpO2: 98% 96% 97% 97%  Weight: 122.2 kg     Height:       Weight change: -0.363 kg  Intake/Output Summary (Last 24 hours) at 02/21/2019 1215 Last data filed at 02/21/2019 1105 Gross per 24 hour  Intake 721 ml  Output 1925 ml  Net -1204 ml    Assessment/Plan: 63 year old black male with a significant cardiomyopathy and fairly advanced CKD now in trouble with volume overload 1.Renal-patient with moderate CKD at baseline with creatinine in the high twos.  Has suffered acute on chronic renal failure during the course of this hospitalization with appropriate diuresis but also some borderline low blood pressures.  I suspect this injury is hemodynamically mediated.  I agree that  patient is still volume overloaded.  attempting to  try to retain renal perfusion in the setting of attempted diuresis.  I have chosen to start midodrine, decrease sildenafil slightly and will try to max out oral diuretics at torsemide 100 twice daily with metolazone 10 daily.  This will either be effective with diuresis and kidney function will remain stable or improve, or renal function will continue to worsen.  In that case there may be a role for dialysis.  I have told patient that cardiomyopathy can make dialysis much more difficult-even not doable in some situations but we would not know until we tried.  He seems very willing to utilize dialysis if and when it may be needed.  Some diuresis with crt bump today but no immediate needs for dialysis-  Stay the course today  2. Hypertension/volume  -volume overloaded as above.  Medication changes also as above 3.  Anemia  -is not to a level that requires treatment at this point 4.  Hypokalemia-continue to replete as needed-status post 40 mEq today- will order 40 BID tomorrow    Louis Meckel    Labs: Basic Metabolic Panel: Recent Labs  Lab 02/19/19 0447 02/20/19 0915 02/21/19 0525  NA 140 141 139  K 3.4* 3.2* 3.2*  CL 100 100 98  CO2 29 29 30   GLUCOSE 106* 107* 107*  BUN 55* 59* 66*  CREATININE 3.01* 3.55* 3.84*  CALCIUM 9.1 9.1 9.0   Liver Function Tests: No results for input(s): AST, ALT, ALKPHOS, BILITOT, PROT, ALBUMIN in the last 168 hours. No results for input(s): LIPASE, AMYLASE in the last 168 hours. No results for input(s): AMMONIA in the last 168 hours. CBC: Recent Labs  Lab 02/17/19 0444 02/18/19 0517 02/19/19 0447 02/20/19 0416 02/21/19 0525  WBC 8.5 7.9 6.7 7.5 6.7  HGB 11.9* 12.6* 12.3* 13.2 12.3*  HCT 37.7* 40.5 40.0 42.3 38.5*  MCV 100.3* 100.7* 101.5* 100.2* 99.0  PLT 158 165 182 209 176   Cardiac Enzymes: No results for input(s): CKTOTAL, CKMB, CKMBINDEX, TROPONINI in the last 168 hours. CBG: Recent Labs  Lab 02/20/19 1123 02/20/19 1620 02/20/19 2124 02/21/19 0753 02/21/19 1201  GLUCAP 146* 139* 141* 94 111*    Iron Studies: No results for input(s): IRON, TIBC, TRANSFERRIN, FERRITIN in the last  72 hours. Studies/Results: No results found. Medications: Infusions: . sodium chloride    . sodium chloride      Scheduled Medications: . amiodarone  200 mg Oral BID  . atorvastatin  80 mg Oral q1800  . Chlorhexidine Gluconate Cloth  6 each Topical Daily  . metolazone  10 mg Oral Daily  . midodrine  10 mg Oral TID WC  . potassium chloride  40 mEq Oral Daily  . potassium chloride  40 mEq Oral Once  . sildenafil  20 mg Oral BID  . sodium chloride flush  3 mL Intravenous Q12H  . sodium chloride flush  3 mL Intravenous Q12H  . sodium chloride flush  3 mL Intravenous Q12H  . torsemide  100 mg Oral BID  . warfarin  4 mg Oral ONCE-1800  .  Warfarin - Pharmacist Dosing Inpatient   Does not apply q1800    have reviewed scheduled and prn medications.  Physical Exam: General: very debilitated- does not bode well for prognosis Heart: RRR Lungs: moslty clear Abdomen: obese, soft Extremities: pitting edema     02/21/2019,12:15 PM  LOS: 8 days

## 2019-02-22 DIAGNOSIS — I5043 Acute on chronic combined systolic (congestive) and diastolic (congestive) heart failure: Secondary | ICD-10-CM | POA: Diagnosis not present

## 2019-02-22 LAB — GLUCOSE, CAPILLARY
Glucose-Capillary: 99 mg/dL (ref 70–99)
Glucose-Capillary: 109 mg/dL — ABNORMAL HIGH (ref 70–99)
Glucose-Capillary: 149 mg/dL — ABNORMAL HIGH (ref 70–99)
Glucose-Capillary: 104 mg/dL — ABNORMAL HIGH (ref 70–99)

## 2019-02-22 LAB — CBC
HCT: 39 % (ref 39.0–52.0)
Hemoglobin: 12.3 g/dL — ABNORMAL LOW (ref 13.0–17.0)
MCH: 31.2 pg (ref 26.0–34.0)
MCHC: 31.5 g/dL (ref 30.0–36.0)
MCV: 99 fL (ref 80.0–100.0)
Platelets: 209 10*3/uL (ref 150–400)
RBC: 3.94 MIL/uL — ABNORMAL LOW (ref 4.22–5.81)
RDW: 16.4 % — ABNORMAL HIGH (ref 11.5–15.5)
WBC: 7.5 10*3/uL (ref 4.0–10.5)
nRBC: 0 % (ref 0.0–0.2)

## 2019-02-22 LAB — BASIC METABOLIC PANEL
Anion gap: 14 (ref 5–15)
BUN: 69 mg/dL — ABNORMAL HIGH (ref 8–23)
CO2: 30 mmol/L (ref 22–32)
Calcium: 9.3 mg/dL (ref 8.9–10.3)
Chloride: 97 mmol/L — ABNORMAL LOW (ref 98–111)
Creatinine, Ser: 4.17 mg/dL — ABNORMAL HIGH (ref 0.61–1.24)
GFR calc Af Amer: 17 mL/min — ABNORMAL LOW (ref >60)
GFR calc non Af Amer: 14 mL/min — ABNORMAL LOW (ref >60)
Glucose, Bld: 108 mg/dL — ABNORMAL HIGH (ref 70–99)
Potassium: 3.3 mmol/L — ABNORMAL LOW (ref 3.5–5.1)
Sodium: 141 mmol/L (ref 135–145)

## 2019-02-22 LAB — COOXEMETRY PANEL
Carboxyhemoglobin: 1.4 % (ref 0.5–1.5)
Methemoglobin: 0.5 % (ref 0.0–1.5)
O2 Saturation: 66.7 %
Total hemoglobin: 13.8 g/dL (ref 12.0–16.0)

## 2019-02-22 LAB — MAGNESIUM: Magnesium: 2.5 mg/dL — ABNORMAL HIGH (ref 1.7–2.4)

## 2019-02-22 LAB — PROTIME-INR
INR: 2.2 — ABNORMAL HIGH (ref 0.8–1.2)
Prothrombin Time: 24.5 seconds — ABNORMAL HIGH (ref 11.4–15.2)

## 2019-02-22 MED ORDER — WARFARIN SODIUM 2.5 MG PO TABS: 2.5000 mg | ORAL_TABLET | Freq: Once | ORAL | Status: DC

## 2019-02-22 NOTE — Progress Notes (Signed)
Physical Therapy Treatment Patient Details Name: [name] MRN: [id number] DOB: [birth year] Today's Date: 02/22/2019    History of Present Illness [name] is a 63 y.o. male with history of longstanding cardiomyopathy out of proportion to CAD/chronic biventricular CHF (EF 20-25% 07/2017), prior VT, s/p St Jude ICD, CAD, CVA, OSA on CPAP, pulmonary HTN on O2 at home, PAF on coumadin, HTN, DM, and CKD stage 3.    PT Comments    Pt making steady progress with mobility. Continue to have difficulty with bed mobility. Obtained purple slide sheet to place under pt's calves and feet in bed to assist pt with moving his legs.    Follow Up Recommendations  Home health PT;Supervision/Assistance - 24 hour     Equipment Recommendations  Rolling walker with 5" wheels    Recommendations for Other Services OT consult(ordered)     Precautions / Restrictions Precautions Precautions: Fall    Mobility  Bed Mobility Overal bed mobility: Needs Assistance Bed Mobility: Supine to Sit     Supine to sit: Mod assist     General bed mobility comments: Assist to bring legs off EOB and to elevate trunk into sitting  Transfers Overall transfer level: Needs assistance Equipment used: Rolling walker (2 wheeled) Transfers: Sit to/from Stand Sit to Stand: Min guard;From elevated surface         General transfer comment: Assist for safety. Pt pulls up on walker despite verbal cues.  Ambulation/Gait Ambulation/Gait assistance: Min guard Gait Distance (Feet): 100 Feet(100' x 1, 50' x 2) Assistive device: Rolling walker (2 wheeled) Gait Pattern/deviations: Step-through pattern;Decreased stride length;Wide base of support Gait velocity: decr Gait velocity interpretation: <1.31 ft/sec, indicative of household ambulator General Gait Details: Assist for safety. Pt with several seated rest breaks and several standing rest breaks. Amb on 2L of O2 with SpO2 > 90%   Stairs              Wheelchair Mobility    Modified Rankin (Stroke Patients Only)       Balance Overall balance assessment: Needs assistance Sitting-balance support: No upper extremity supported;Feet supported Sitting balance-Leahy Scale: Good     Standing balance support: Bilateral upper extremity supported Standing balance-Leahy Scale: Poor Standing balance comment: walker and min guard for static standing                            Cognition Arousal/Alertness: Awake/alert Behavior During Therapy: WFL for tasks assessed/performed Overall Cognitive Status: Within Functional Limits for tasks assessed                                        Exercises      General Comments        Pertinent Vitals/Pain Pain Assessment: Faces Faces Pain Scale: Hurts little more Pain Location: knees Pain Descriptors / Indicators: Aching;Grimacing Pain Intervention(s): Limited activity within patient's tolerance    Home Living                      Prior Function            PT Goals (current goals can now be found in the care plan section) Progress towards PT goals: Progressing toward goals    Frequency    Min 3X/week      PT Plan      Co-evaluation  AM-PAC PT "6 Clicks" Mobility   Outcome Measure  Help needed turning from your back to your side while in a flat bed without using bedrails?: A Lot Help needed moving from lying on your back to sitting on the side of a flat bed without using bedrails?: A Lot Help needed moving to and from a bed to a chair (including a wheelchair)?: A Little Help needed standing up from a chair using your arms (e.g., wheelchair or bedside chair)?: A Little Help needed to walk in hospital room?: A Little Help needed climbing 3-5 steps with a railing? : A Lot 6 Click Score: 15    End of Session Equipment Utilized During Treatment: Gait belt Activity Tolerance: Patient tolerated treatment well Patient  left: in chair;with call bell/phone within reach Nurse Communication: Mobility status PT Visit Diagnosis: Unsteadiness on feet (R26.81);Other abnormalities of gait and mobility (R26.89);Muscle weakness (generalized) (M62.81)     Time: 6503-5465 PT Time Calculation (min) (ACUTE ONLY): 36 min  Charges:  $Gait Training: 23-37 mins                     [place] Pager [phone] Office [place] 02/22/2019, 12:38 PM

## 2019-02-22 NOTE — Progress Notes (Signed)
Subjective:  UOP 1400 with max meds, only diuresed 285-  BUN and crt up again and K down but BP seems better (higher).  I have told him that I do not see this getting better quickly without dialysis- he is agreeable   Objective Vital signs in last 24 hours: Vitals:   02/21/19 1000 02/21/19 2116 02/22/19 0100 02/22/19 0831  BP: (!) 117/95 98/61 (!) 114/48 102/76  Pulse: 71 68 79 69  Resp: 18     Temp: 98.4 F (36.9 C) 97.8 F (36.6 C)  (!) 97.4 F (36.3 C)  TempSrc: Oral Oral  Oral  SpO2: 97% 99% 100% 97%  Weight:   120.2 kg   Height:       Weight change: -1.996 kg  Intake/Output Summary (Last 24 hours) at 02/22/2019 6599 Last data filed at 02/22/2019 0930 Gross per 24 hour  Intake 1020 ml  Output 1675 ml  Net -655 ml    Assessment/Plan: 63 year old black male with a significant cardiomyopathy and fairly advanced CKD now in trouble with volume overload 1.Renal-patient with moderate CKD at baseline with creatinine in the high twos.  Has suffered acute on chronic renal failure during the course of this hospitalization with appropriate diuresis but also some borderline low blood pressures.  I suspect this injury is hemodynamically mediated.  I agree that  patient is still volume overloaded.  attempting to  try to retain renal perfusion in the setting of attempted diuresis.  I have chosen to start midodrine, decrease sildenafil and max out oral diuretics at torsemide 100 twice daily with metolazone 10 daily.  This has led to some diuresis (not enough) and renal function worsening.  There now may be a role for dialysis, I have told him that I dont see this resolving quickly without diaysis.  I have told patient that cardiomyopathy can make dialysis much more difficult-even not doable in some situations but we would not know until we tried.  He seems very willing to utilize dialysis if and when it may be needed. Will go ahead and make plans for IR to place [facility] and to start dialysis.  I  attempted to call wife to notify her of events, no answer.  Access may be a small issue due to coumadin-  Currently has triple lumen in right IJ   2. Hypertension/volume  -volume overloaded as above.  Keep meds for diuresis for now 3. Anemia  -is not to a level that requires treatment at this point 4.  Hypokalemia-continue to replete as needed-status post 40 mEq today- will order 40 BID today    Louis Meckel    Labs: Basic Metabolic Panel: Recent Labs  Lab 02/20/19 0915 02/21/19 0525 02/22/19 0500  NA 141 139 141  K 3.2* 3.2* 3.3*  CL 100 98 97*  CO2 29 30 30   GLUCOSE 107* 107* 108*  BUN 59* 66* 69*  CREATININE 3.55* 3.84* 4.17*  CALCIUM 9.1 9.0 9.3   Liver Function Tests: No results for input(s): AST, ALT, ALKPHOS, BILITOT, PROT, ALBUMIN in the last 168 hours. No results for input(s): LIPASE, AMYLASE in the last 168 hours. No results for input(s): AMMONIA in the last 168 hours. CBC: Recent Labs  Lab 02/18/19 0517 02/19/19 0447 02/20/19 0416 02/21/19 0525 02/22/19 0500  WBC 7.9 6.7 7.5 6.7 7.5  HGB 12.6* 12.3* 13.2 12.3* 12.3*  HCT 40.5 40.0 42.3 38.5* 39.0  MCV 100.7* 101.5* 100.2* 99.0 99.0  PLT 165 182 209 176 209   Cardiac  Enzymes: No results for input(s): CKTOTAL, CKMB, CKMBINDEX, TROPONINI in the last 168 hours. CBG: Recent Labs  Lab 02/21/19 0753 02/21/19 1201 02/21/19 1709 02/21/19 2111 02/22/19 0826  GLUCAP 94 111* 104* 95 99    Iron Studies: No results for input(s): IRON, TIBC, TRANSFERRIN, FERRITIN in the last 72 hours. Studies/Results: No results found. Medications: Infusions: . sodium chloride    . sodium chloride      Scheduled Medications: . amiodarone  200 mg Oral BID  . atorvastatin  80 mg Oral q1800  . Chlorhexidine Gluconate Cloth  6 each Topical Daily  . metolazone  10 mg Oral Daily  . midodrine  10 mg Oral TID WC  . potassium chloride  40 mEq Oral BID  . sildenafil  20 mg Oral BID  . sodium chloride flush  3 mL  Intravenous Q12H  . sodium chloride flush  3 mL Intravenous Q12H  . sodium chloride flush  3 mL Intravenous Q12H  . torsemide  100 mg Oral BID  . Warfarin - Pharmacist Dosing Inpatient   Does not apply q1800    have reviewed scheduled and prn medications.  Physical Exam: General: very debilitated- does not bode well for prognosis Heart: RRR Lungs: moslty clear Abdomen: obese, soft Extremities: pitting edema - not much change     02/22/2019,9:38 AM  LOS: 9 days

## 2019-02-22 NOTE — Progress Notes (Signed)
ANTICOAGULATION CONSULT NOTE - Follow Up Consult  Pharmacy Consult for Warfarin Indication: atrial fibrillation  No Known Allergies  Patient Measurements: Height: 5\' 10"  (177.8 cm) Weight: 264 lb 14.4 oz (120.2 kg) IBW/kg (Calculated) : 73  Vital Signs: Temp: 97.8 F (36.6 C) (10/21 1100) Temp Source: Oral (10/21 1100) BP: 103/70 (10/21 1100) Pulse Rate: 71 (10/21 1100)  Labs: Recent Labs    02/20/19 0416 02/20/19 0915 02/21/19 0525 02/22/19 0500  HGB 13.2  --  12.3* 12.3*  HCT 42.3  --  38.5* 39.0  PLT 209  --  176 209  LABPROT 25.2*  --  25.1* 24.5*  INR 2.3*  --  2.3* 2.2*  CREATININE  --  3.55* 3.84* 4.17*    Estimated Creatinine Clearance: 23.9 mL/min (A) (by C-G formula based on SCr of 4.17 mg/dL (H)).   Medical History: Past Medical History:  Diagnosis Date  . CAD (coronary artery disease)    a. Initial nonobst by cath 2009. b. Cath 01/2013 in setting of VT storm: obstructive distal Cx disease (small and terminates in the AV groove, unlikely to cause significant ischemia or electrical instability), nonobstructive RCA disease, EF 15-20%.   . Cerebrovascular accident [facility])    a. 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 Chronic systolic CHF (congestive heart failure) ([place])    a. Likely NICM (out of proportion to CAD). b. 2009 - EF 25-30% by echo, 01/2013: 15-20% by cath. c. 11/2014 Echo: EF 35-40%, Gr1 DD, mild MR, mod TR, PASP 71mmHg.  . CKD (chronic kidney disease), stage II   . Dyslipidemia   . Gout   . HTN (hypertension)   . Hypokalemia   . ICD (implantable cardiac defibrillator) in place   . Insulin dependent diabetes mellitus   . Lipoma   . Nonischemic cardiomyopathy ([place])    a.  11/2014 Echo: EF 35-40%, Gr1 DD, mild MR, mod TR, PASP 44mmHg.  . OSA (obstructive sleep apnea)    does not wear cpap  . PAF (paroxysmal atrial fibrillation) ([place])    a. Noted 05/2008 by EKG;  b. CHA2DS2VASc = 5-6-->coumadin.  . Paroxysmal VT ([place])    a. s/p St. Jude ICD 2007. b.  H/o paroxysmal VT/VF including VT storm 12/2012 admission prompting amio initiation;  c. 01/2013 ICD upgrade SJM 1411-36Q Ellipse VR single lead ICD.  [name] Pulmonary HTN ([place])    a. Mild by cath 01/2013.    Medications:  Scheduled:  . amiodarone  200 mg Oral BID  . atorvastatin  80 mg Oral q1800  . Chlorhexidine Gluconate Cloth  6 each Topical Daily  . metolazone  10 mg Oral Daily  . midodrine  10 mg Oral TID WC  . potassium chloride  40 mEq Oral BID  . sildenafil  20 mg Oral BID  . sodium chloride flush  3 mL Intravenous Q12H  . sodium chloride flush  3 mL Intravenous Q12H  . sodium chloride flush  3 mL Intravenous Q12H  . torsemide  100 mg Oral BID  . warfarin  2.5 mg Oral ONCE-1800  . Warfarin - Pharmacist Dosing Inpatient   Does not apply q1800   Infusions:  . sodium chloride    . sodium chloride      Assessment: Pt is a 63 y/o male with PMH of CHF (EF 20-25%), prior VT s/p St Jude ICD, CAD, CVA, OSA on CPAP, pulmonary HTN, HTN, DM, CKDs stage III, and PAF on coumadin PTA (5mg  daily, except 2.5 mg on fridays). Pt  presents to the ED with volume overload diuresing with IV furosemide. Pharmacy has been consulted to dose warfarin.  Patient is s/p RHC 10/15.    Patient's INR today remains within goal at 2.2.  CBC is stable. No s/sx of bleeding per notes.  Received call from IR regarding warfarin. They would like INR closer to 1.5 before placing TDC. Will hold warfarin for now. Will consider heparin bridge once INR down.   Goal of Therapy:  INR 2-3 Monitor platelets by anticoagulation protocol: Yes   Plan:  Hold warfarin until after [facility] placed Consider heparin bridge once INR <2  Erin Hearing PharmD., BCPS Clinical Pharmacist 02/22/2019 1:36 PM

## 2019-02-22 NOTE — Progress Notes (Signed)
Responded to spiritual Care consult to support patient. Patient was being cared for by staff.  Chaplain available as needed. Nurse said patient was fine and she was not aware that he was palliative care.   Jaclynn Major, [place], [facility], Pager [phone]

## 2019-02-22 NOTE — Progress Notes (Signed)
Patient ID: [name], male   DOB: 1956-04-28, 63 y.o.   MRN: [id number]     Advanced Heart Failure Rounding Note  PCP-Cardiologist: Minus Breeding, MD  [facility]: Dr. Haroldine Laws   Subjective:    Only modest urine output on high-dose torsemide and metolazone. Creatinine up to 4.2. CVP 15. Co-ox 67%  Denies orthopnea or PND. No CP. Feels bloated    Objective:   Weight Range: 120.2 kg Body mass index is 38.01 kg/m.   Vital Signs:   Temp:  [97.4 F (36.3 C)-97.8 F (36.6 C)] 97.7 F (36.5 C) (10/21 1636) Pulse Rate:  [68-79] 71 (10/21 1636) BP: (97-114)/(48-77) 97/77 (10/21 1636) SpO2:  [97 %-100 %] 97 % (10/21 1636) Weight:  [120.2 kg] 120.2 kg (10/21 0100) Last BM Date: 02/22/19  Weight change: Filed Weights   02/20/19 0352 02/21/19 0516 02/22/19 0100  Weight: 122.5 kg 122.2 kg 120.2 kg    Intake/Output:   Intake/Output Summary (Last 24 hours) at 02/22/2019 1816 Last data filed at 02/22/2019 1500 Gross per 24 hour  Intake 840 ml  Output 1225 ml  Net -385 ml      Physical Exam   General: Lying in bed . No resp difficulty HEENT: normal Neck: supple. JVP to ear + RIJ TLC Carotids 2+ bilat; no bruits. No lymphadenopathy or thryomegaly appreciated. Cor: PMI nondisplaced. Regular rate & rhythm. 2/6 TR Lungs: clear Abdomen: obese soft, nontender, nondistended. No hepatosplenomegaly. No bruits or masses. Good bowel sounds. Extremities: no cyanosis, clubbing, rash, 3+ edema into thighs + unna boots Neuro: alert & orientedx3, cranial nerves grossly intact. moves all 4 extremities w/o difficulty. Affect pleasant   Telemetry   SR 70-80s + PVCs Personally reviewed    EKG    N/A  Labs    CBC Recent Labs    02/21/19 0525 02/22/19 0500  WBC 6.7 7.5  HGB 12.3* 12.3*  HCT 38.5* 39.0  MCV 99.0 99.0  PLT 176 193   Basic Metabolic Panel Recent Labs    02/21/19 0525 02/22/19 0500  NA 139 141  K 3.2* 3.3*  CL 98 97*  CO2 30 30  GLUCOSE 107* 108*   BUN 66* 69*  CREATININE 3.84* 4.17*  CALCIUM 9.0 9.3  MG 2.4 2.5*   Liver Function Tests No results for input(s): AST, ALT, ALKPHOS, BILITOT, PROT, ALBUMIN in the last 72 hours. No results for input(s): LIPASE, AMYLASE in the last 72 hours. Cardiac Enzymes No results for input(s): CKTOTAL, CKMB, CKMBINDEX, TROPONINI in the last 72 hours.  BNP: BNP (last 3 results) Recent Labs    07/19/18 1106 01/02/19 1552 02/13/19 1114  BNP 1,608.5* 2,154.7* 2,637.3*    ProBNP (last 3 results) Recent Labs    11/01/18 1229  PROBNP 2,158.0*     D-Dimer No results for input(s): DDIMER in the last 72 hours. Hemoglobin A1C No results for input(s): HGBA1C in the last 72 hours. Fasting Lipid Panel No results for input(s): CHOL, HDL, LDLCALC, TRIG, CHOLHDL, LDLDIRECT in the last 72 hours. Thyroid Function Tests No results for input(s): TSH, T4TOTAL, T3FREE, THYROIDAB in the last 72 hours.  Invalid input(s): FREET3  Other results:   Imaging    No results found.   Medications:     Scheduled Medications: . amiodarone  200 mg Oral BID  . atorvastatin  80 mg Oral q1800  . Chlorhexidine Gluconate Cloth  6 each Topical Daily  . metolazone  10 mg Oral Daily  . midodrine  10 mg Oral TID  WC  . potassium chloride  40 mEq Oral BID  . sildenafil  20 mg Oral BID  . sodium chloride flush  3 mL Intravenous Q12H  . sodium chloride flush  3 mL Intravenous Q12H  . sodium chloride flush  3 mL Intravenous Q12H  . torsemide  100 mg Oral BID    Infusions: . sodium chloride    . sodium chloride      PRN Medications: sodium chloride, sodium chloride, acetaminophen, loperamide, ondansetron (ZOFRAN) IV, sodium chloride flush, sodium chloride flush    Patient Profile   63 y/o male with severe systolic HF with biventricular dysfunction due to NICM and advanced CKD. Recently hospitalized with low output HF. Now returns with progressive NYHA IV symptoms with marked fluid overload not  responding to titration of oral diuretic regimen. With CKD 4, biventricular HF and morbid obesity advanced options are quite limited.  Assessment/Plan    1. Acute on Chronic Systolic HF with biventricular dysfunction due to NICM: mild nonobstructive CAD on [facility] 12/2018. CM out of proportion of CAD. PYP scan and myeloma panel completed and were not suggestive of cardiac amyloidosis. S/p St Jude ICD. ECHO 07/28/2017 EF 20-25%. RV mildly dilated. Echo 11/17/18 EF 20-25% RV dilated and severely HK with septal flattening. Presented to ED 10/12 w/ NYHA IV symptoms and markedly volume overload w/ 3+ bilateral LEE, left basilar rales and elevated JVD to level of ear. Admit Wt up 10+ lb from home baseline. BNP 2600 on admit.  -Poor initial response to high dose IV diuretics w/ increase in SCr from  2.76>>3.30, prompting initiation of inotropic support, started on milrinone. Milrinone later stopped due to ventricular ectopy.  - [place] 10/15 with R sided HF and PAH.  - Co-ox stable off milrinone  - Off sildenafil due to low BP - remains significantly volume overloaded despite high-dose diuretics. Creatinine up to 4.2 - Renal planning placement of [facility] tomorrow. I agree with plans for HD -He continues to deteriorate with biventricular dysfunction/PAH and progressive CKD. Only real option seems to be consideration of heart/kidney transplant but Body mass index is 38.01 kg/m. which is prohibitive and I explained this to him again today   2. CKD stage 3 - Baseline creatinine ~1.9.   Cr 2.8 -> 2.4-> 2.7-> 3.4 -> 2.97->2.76->2.8 -> 3.07 -> 3.01-> 3.5>3.8 > 4.2 .  - Suspect cardiorenal syndrome -Nephrology appreciated - Plan placement of TDC  3. CAD - LHC8/20with LAD 20%50% distal left circ and 20% mid RCA stenosis. No significant disease in left main or LAD. - No s/s ischemia Hs Trop 284 but likely demand ischemia in the setting of a/c CHF and abnormal renal function.   - Continue statin. - Hold ? blocker due  to soft BP/ concern for low output - No ASA due to chronic coumadin  4. PAF  -s/p recent DC-CV for AFL.  - Continue amio 200 mg bid, he is in NSR.    - On coumadin, followed by coumadin clinic on [place].    6. [place] ICD.  -Had VT episode (rate 190 bpm) w/ delivery of shock on 02/07/19  -Started on IV amiodarone 10/14 w/ improvement, NSVT less frequent off milrinone.  - Continue amiodarone 200 mg po twice a day.  -Keep K >4.0 and Mg > 2.0 .  7. Pulmonary HTN - On home O2 (2L).  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 Had negative V/Q scan ruling out CTEPH.  -  RHC PVR 6.5. Started on sildenafil 20 mg three times a day.  - Off sildenafil due to low BP and need to preserve renal perfusion  8. Hypokalemia: K low. Supp K     Length of Stay: 9  Glori Bickers, MD  02/22/2019, 6:16 PM  Advanced Heart Failure Team Pager [phone] (M-F; [place])  Please contact [facility] for night-coverage after hours (4p -7a ) and weekends on [email or web address]

## 2019-02-22 NOTE — Progress Notes (Signed)
ANTICOAGULATION CONSULT NOTE - Follow Up Consult  Pharmacy Consult for Warfarin Indication: atrial fibrillation  No Known Allergies  Patient Measurements: Height: 5\' 10"  (177.8 cm) Weight: 264 lb 14.4 oz (120.2 kg) IBW/kg (Calculated) : 73  Vital Signs: Temp: 97.4 F (36.3 C) (10/21 0831) Temp Source: Oral (10/21 0831) BP: 102/76 (10/21 0831) Pulse Rate: 69 (10/21 0831)  Labs: Recent Labs    02/20/19 0416 02/20/19 0915 02/21/19 0525 02/22/19 0500  HGB 13.2  --  12.3* 12.3*  HCT 42.3  --  38.5* 39.0  PLT 209  --  176 209  LABPROT 25.2*  --  25.1* 24.5*  INR 2.3*  --  2.3* 2.2*  CREATININE  --  3.55* 3.84* 4.17*    Estimated Creatinine Clearance: 23.9 mL/min (A) (by C-G formula based on SCr of 4.17 mg/dL (H)).   Medical History: Past Medical History:  Diagnosis Date  . CAD (coronary artery disease)    a. Initial nonobst by cath 2009. b. Cath 01/2013 in setting of VT storm: obstructive distal Cx disease (small and terminates in the AV groove, unlikely to cause significant ischemia or electrical instability), nonobstructive RCA disease, EF 15-20%.   . Cerebrovascular accident [facility])    a. 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 Chronic systolic CHF (congestive heart failure) ([place])    a. Likely NICM (out of proportion to CAD). b. 2009 - EF 25-30% by echo, 01/2013: 15-20% by cath. c. 11/2014 Echo: EF 35-40%, Gr1 DD, mild MR, mod TR, PASP 73mmHg.  . CKD (chronic kidney disease), stage II   . Dyslipidemia   . Gout   . HTN (hypertension)   . Hypokalemia   . ICD (implantable cardiac defibrillator) in place   . Insulin dependent diabetes mellitus   . Lipoma   . Nonischemic cardiomyopathy ([place])    a.  11/2014 Echo: EF 35-40%, Gr1 DD, mild MR, mod TR, PASP 11mmHg.  . OSA (obstructive sleep apnea)    does not wear cpap  . PAF (paroxysmal atrial fibrillation) ([place])    a. Noted 05/2008 by EKG;  b. CHA2DS2VASc = 5-6-->coumadin.  . Paroxysmal VT ([place])    a. s/p St. Jude ICD 2007. b.  H/o paroxysmal VT/VF including VT storm 12/2012 admission prompting amio initiation;  c. 01/2013 ICD upgrade SJM 1411-36Q Ellipse VR single lead ICD.  [name] Pulmonary HTN ([place])    a. Mild by cath 01/2013.    Medications:  Scheduled:  . amiodarone  200 mg Oral BID  . atorvastatin  80 mg Oral q1800  . Chlorhexidine Gluconate Cloth  6 each Topical Daily  . metolazone  10 mg Oral Daily  . midodrine  10 mg Oral TID WC  . potassium chloride  40 mEq Oral BID  . sildenafil  20 mg Oral BID  . sodium chloride flush  3 mL Intravenous Q12H  . sodium chloride flush  3 mL Intravenous Q12H  . sodium chloride flush  3 mL Intravenous Q12H  . torsemide  100 mg Oral BID  . Warfarin - Pharmacist Dosing Inpatient   Does not apply q1800   Infusions:  . sodium chloride    . sodium chloride      Assessment: Pt is a 63 y/o male with PMH of CHF (EF 20-25%), prior VT s/p St Jude ICD, CAD, CVA, OSA on CPAP, pulmonary HTN, HTN, DM, CKDs stage III, and PAF on coumadin PTA (5mg  daily, except 2.5 mg on fridays). Pt presents to the ED with volume overload diuresing  with IV furosemide. Pharmacy has been consulted to dose warfarin.  Patient is s/p RHC 10/15.    Patient's INR today remains within goal, 2.2.  CBC is stable. No s/sx of bleeding per notes.  Goal of Therapy:  INR 2-3 Monitor platelets by anticoagulation protocol: Yes   Plan:  Give warfarin 2.5 mg x 1 tonight d/t IR needing to place [facility] Monitor for signs/symptoms of bleeding Check daily INR, CBC  Kennon Holter, PharmD PGY1 Ambulatory Care Pharmacy Resident [employer] Phone: [phone]

## 2019-02-23 DIAGNOSIS — Z515 Encounter for palliative care: Secondary | ICD-10-CM

## 2019-02-23 DIAGNOSIS — I5043 Acute on chronic combined systolic (congestive) and diastolic (congestive) heart failure: Secondary | ICD-10-CM | POA: Diagnosis not present

## 2019-02-23 DIAGNOSIS — I472 Ventricular tachycardia: Secondary | ICD-10-CM | POA: Diagnosis not present

## 2019-02-23 LAB — CBC
HCT: 39.4 % (ref 39.0–52.0)
Hemoglobin: 12.7 g/dL — ABNORMAL LOW (ref 13.0–17.0)
MCH: 31.6 pg (ref 26.0–34.0)
MCHC: 32.2 g/dL (ref 30.0–36.0)
MCV: 98 fL (ref 80.0–100.0)
Platelets: 221 10*3/uL (ref 150–400)
RBC: 4.02 MIL/uL — ABNORMAL LOW (ref 4.22–5.81)
RDW: 16.4 % — ABNORMAL HIGH (ref 11.5–15.5)
WBC: 7.4 10*3/uL (ref 4.0–10.5)
nRBC: 0 % (ref 0.0–0.2)

## 2019-02-23 LAB — BASIC METABOLIC PANEL
Anion gap: 15 (ref 5–15)
BUN: 73 mg/dL — ABNORMAL HIGH (ref 8–23)
CO2: 30 mmol/L (ref 22–32)
Calcium: 9.4 mg/dL (ref 8.9–10.3)
Chloride: 94 mmol/L — ABNORMAL LOW (ref 98–111)
Creatinine, Ser: 4.34 mg/dL — ABNORMAL HIGH (ref 0.61–1.24)
GFR calc Af Amer: 16 mL/min — ABNORMAL LOW (ref >60)
GFR calc non Af Amer: 14 mL/min — ABNORMAL LOW (ref >60)
Glucose, Bld: 107 mg/dL — ABNORMAL HIGH (ref 70–99)
Potassium: 3.1 mmol/L — ABNORMAL LOW (ref 3.5–5.1)
Sodium: 139 mmol/L (ref 135–145)

## 2019-02-23 LAB — GLUCOSE, CAPILLARY
Glucose-Capillary: 100 mg/dL — ABNORMAL HIGH (ref 70–99)
Glucose-Capillary: 123 mg/dL — ABNORMAL HIGH (ref 70–99)
Glucose-Capillary: 109 mg/dL — ABNORMAL HIGH (ref 70–99)
Glucose-Capillary: 104 mg/dL — ABNORMAL HIGH (ref 70–99)

## 2019-02-23 LAB — COOXEMETRY PANEL
Carboxyhemoglobin: 1.5 % (ref 0.5–1.5)
Methemoglobin: 0.6 % (ref 0.0–1.5)
O2 Saturation: 68.9 %
Total hemoglobin: 12.8 g/dL (ref 12.0–16.0)

## 2019-02-23 LAB — MAGNESIUM: Magnesium: 2.3 mg/dL (ref 1.7–2.4)

## 2019-02-23 LAB — PROTIME-INR
INR: 2 — ABNORMAL HIGH (ref 0.8–1.2)
Prothrombin Time: 22.3 seconds — ABNORMAL HIGH (ref 11.4–15.2)

## 2019-02-23 MED ORDER — POTASSIUM CHLORIDE CRYS ER 20 MEQ PO TBCR
40.0000 meq | EXTENDED_RELEASE_TABLET | Freq: Three times a day (TID) | ORAL | Status: DC
  Administered 2019-02-23 – 2019-02-25 (×8): 40 meq via ORAL
  Filled 2019-02-23 (×8): qty 2

## 2019-02-23 MED ORDER — POTASSIUM CHLORIDE CRYS ER 20 MEQ PO TBCR
20.0000 meq | EXTENDED_RELEASE_TABLET | Freq: Once | ORAL | Status: AC
  Administered 2019-02-23: 20 meq via ORAL
  Filled 2019-02-23: qty 1

## 2019-02-23 MED ORDER — CHLORHEXIDINE GLUCONATE CLOTH 2 % EX PADS
6.0000 | MEDICATED_PAD | Freq: Every day | CUTANEOUS | Status: DC
  Administered 2019-02-24: 6 via TOPICAL

## 2019-02-23 MED ORDER — SODIUM CHLORIDE 0.9% FLUSH
10.0000 mL | Freq: Two times a day (BID) | INTRAVENOUS | Status: DC
  Administered 2019-02-23 – 2019-03-03 (×15): 10 mL via INTRAVENOUS

## 2019-02-23 NOTE — Progress Notes (Signed)
ANTICOAGULATION CONSULT NOTE - Follow Up Consult  Pharmacy Consult for Warfarin Indication: atrial fibrillation  No Known Allergies  Patient Measurements: Height: 5\' 10"  (177.8 cm) Weight: 259 lb 9.6 oz (117.8 kg) IBW/kg (Calculated) : 73  Vital Signs: Temp: 97.6 F (36.4 C) (10/22 0800) Temp Source: Oral (10/22 0800) BP: 106/90 (10/22 0800) Pulse Rate: 70 (10/22 0800)  Labs: Recent Labs    02/21/19 0525 02/22/19 0500 02/23/19 0545  HGB 12.3* 12.3* 12.7*  HCT 38.5* 39.0 39.4  PLT 176 209 221  LABPROT 25.1* 24.5* 22.3*  INR 2.3* 2.2* 2.0*  CREATININE 3.84* 4.17* 4.34*    Estimated Creatinine Clearance: 22.7 mL/min (A) (by C-G formula based on SCr of 4.34 mg/dL (H)).   Medical History: Past Medical History:  Diagnosis Date  . CAD (coronary artery disease)    a. Initial nonobst by cath 2009. b. Cath 01/2013 in setting of VT storm: obstructive distal Cx disease (small and terminates in the AV groove, unlikely to cause significant ischemia or electrical instability), nonobstructive RCA disease, EF 15-20%.   . Cerebrovascular accident [facility])    a. 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 Chronic systolic CHF (congestive heart failure) ([place])    a. Likely NICM (out of proportion to CAD). b. 2009 - EF 25-30% by echo, 01/2013: 15-20% by cath. c. 11/2014 Echo: EF 35-40%, Gr1 DD, mild MR, mod TR, PASP 82mmHg.  . CKD (chronic kidney disease), stage II   . Dyslipidemia   . Gout   . HTN (hypertension)   . Hypokalemia   . ICD (implantable cardiac defibrillator) in place   . Insulin dependent diabetes mellitus   . Lipoma   . Nonischemic cardiomyopathy ([place])    a.  11/2014 Echo: EF 35-40%, Gr1 DD, mild MR, mod TR, PASP 4mmHg.  . OSA (obstructive sleep apnea)    does not wear cpap  . PAF (paroxysmal atrial fibrillation) ([place])    a. Noted 05/2008 by EKG;  b. CHA2DS2VASc = 5-6-->coumadin.  . Paroxysmal VT ([place])    a. s/p St. Jude ICD 2007. b. H/o paroxysmal VT/VF including VT storm  12/2012 admission prompting amio initiation;  c. 01/2013 ICD upgrade SJM 1411-36Q Ellipse VR single lead ICD.  [name] Pulmonary HTN ([place])    a. Mild by cath 01/2013.    Medications:  Scheduled:  . amiodarone  200 mg Oral BID  . atorvastatin  80 mg Oral q1800  . Chlorhexidine Gluconate Cloth  6 each Topical Daily  . [START ON 02/24/2019] Chlorhexidine Gluconate Cloth  6 each Topical Q0600  . metolazone  10 mg Oral Daily  . midodrine  10 mg Oral TID WC  . potassium chloride  40 mEq Oral TID  . sildenafil  20 mg Oral BID  . sodium chloride flush  10 mL Intravenous Q12H  . torsemide  100 mg Oral BID   Infusions:  . sodium chloride      Assessment: Pt is a 63 y/o male with PMH of CHF (EF 20-25%), prior VT s/p St Jude ICD, CAD, CVA, OSA on CPAP, pulmonary HTN, HTN, DM, CKDs stage III, and PAF on coumadin PTA (5mg  daily, except 2.5 mg on fridays). Pt presents to the ED with volume overload diuresing with IV furosemide. Pharmacy has been consulted to dose warfarin.  INR 2 - still holding for HD cath placement tomorrow per IR request.   Goal of Therapy:  INR 2-3 Monitor platelets by anticoagulation protocol: Yes   Plan:  Hold warfarin until after  [place] placed INR in the morning Consider heparin bridge once INR <2   Arrie Senate, PharmD, BCPS Clinical Pharmacist [phone] Please check AMION for all [place] numbers 02/23/2019

## 2019-02-23 NOTE — Evaluation (Signed)
Occupational Therapy Evaluation Patient Details Name: [name] MRN: [id number] DOB: Oct 25, 1955 Today's Date: 02/23/2019    History of Present Illness [name] is a 63 y.o. male with history of longstanding cardiomyopathy out of proportion to CAD/chronic biventricular CHF (EF 20-25% 07/2017), prior VT, s/p St Jude ICD, CAD, CVA, OSA on CPAP, pulmonary HTN on O2 at home, PAF on coumadin, HTN, DM, and CKD stage 3.   Clinical Impression   Upon arrival pt in bed with eyes closed but speaking to therapist. Pt stated that he was sleepy but agreeable to EOB activity. Pt demos decline in function and safety with ADLs and ADL mobility with impaired strength, balance and endurance with edematous LEs, pain in knees. eval limited due to fatigue. Pt reports that PTA, he lived at home with his wife and that she provides set up with bathing, but that he could dress and toilet himself. Pt currently requires mod - max A with bed mobility, max - total A with LB ADLs. Pt declined OOB activity due to fatigue but was able to transfer mod A per last PT note. Pt would benefit from acute OT services to address impairments to maximize level of function and safety    Follow Up Recommendations  Home health OT;Other (comment)(may consider for CIR if  progressing slowly on acute care)    Equipment Recommendations  3 in 1 bedside commode;Tub/shower bench    Recommendations for Other Services       Precautions / Restrictions Precautions Precautions: Fall Restrictions Weight Bearing Restrictions: No      Mobility Bed Mobility Overal bed mobility: Needs Assistance Bed Mobility: Supine to Sit;Sit to Supine     Supine to sit: Mod assist Sit to supine: Max assist   General bed mobility comments: Assist to bring legs off and back onto EOB and to elevate trunk into sitting  Transfers                 General transfer comment: pt declined due to fatigue, per PT note pt able to stand min guard A  with RW    Balance Overall balance assessment: Needs assistance Sitting-balance support: No upper extremity supported;Feet supported Sitting balance-Leahy Scale: Good                                     ADL either performed or assessed with clinical judgement   ADL Overall ADL's : Needs assistance/impaired Eating/Feeding: Independent;Sitting   Grooming: Wash/dry hands;Wash/dry face;Sitting;Set up;Supervision/safety   Upper Body Bathing: Set up;Supervision/ safety;Sitting   Lower Body Bathing: Maximal assistance   Upper Body Dressing : Set up;Supervision/safety;Sitting   Lower Body Dressing: Total assistance     Toilet Transfer Details (indicate cue type and reason): declined. Per PT not, pt able to transfer mod A Toileting- Clothing Manipulation and Hygiene: Total assistance;Bed level         General ADL Comments: pt states that he is sleepy, but agreeable to activity at EOB. LEs very edematous     Vision Patient Visual Report: No change from baseline       Perception     Praxis      Pertinent Vitals/Pain Pain Assessment: Faces Faces Pain Scale: Hurts little more Pain Location: knees Pain Descriptors / Indicators: Aching;Grimacing Pain Intervention(s): Limited activity within patient's tolerance;Monitored during session;Repositioned     Hand Dominance Right   Extremity/Trunk Assessment Upper Extremity Assessment Upper Extremity  Assessment: Generalized weakness   Lower Extremity Assessment Lower Extremity Assessment: Defer to PT evaluation       Communication Communication Communication: No difficulties   Cognition Arousal/Alertness: Awake/alert Behavior During Therapy: WFL for tasks assessed/performed Overall Cognitive Status: Within Functional Limits for tasks assessed                                     General Comments       Exercises     Shoulder Instructions      Home Living Family/patient expects to be  discharged to:: Private residence Living Arrangements: Spouse/significant other;Children Available Help at Discharge: Family;Available 24 hours/day Type of Home: House Home Access: Stairs to enter [employer] of Steps: 2 Entrance Stairs-Rails: None Home Layout: One level     Bathroom Shower/Tub: Tub/shower unit         Home Equipment: None          Prior Functioning/Environment Level of Independence: Independent                 OT Problem List: Decreased strength;Impaired balance (sitting and/or standing);Increased edema;Decreased activity tolerance;Decreased coordination;Decreased knowledge of use of DME or AE      OT Treatment/Interventions: Self-care/ADL training;DME and/or AE instruction;Therapeutic activities;Patient/family education    OT Goals(Current goals can be found in the care plan section) Acute Rehab OT Goals Patient Stated Goal: get better OT Goal Formulation: With patient Time For Goal Achievement: 03/09/19 Potential to Achieve Goals: Good ADL Goals Pt Will Perform Grooming: with set-up;sitting Pt Will Perform Upper Body Bathing: with set-up;sitting Pt Will Perform Lower Body Bathing: with mod assist;sitting/lateral leans;sit to/from stand Pt Will Perform Upper Body Dressing: with set-up;sitting Pt Will Transfer to Toilet: with min assist;ambulating;regular height toilet;bedside commode Pt Will Perform Toileting - Clothing Manipulation and hygiene: with max assist;with mod assist;sit to/from stand Additional ADL Goal #1: Pt will complete bed mobiliyt with min A to sit EOB for ADLs and functional tasks  OT Frequency: Min 2X/week   Barriers to D/C:            Co-evaluation              AM-PAC OT "6 Clicks" Daily Activity     Outcome Measure Help from another person eating meals?: None Help from another person taking care of personal grooming?: A Little Help from another person toileting, which includes using toliet, bedpan, or  urinal?: A Lot Help from another person bathing (including washing, rinsing, drying)?: A Lot Help from another person to put on and taking off regular upper body clothing?: A Little Help from another person to put on and taking off regular lower body clothing?: Total 6 Click Score: 15   End of Session    Activity Tolerance: Patient limited by fatigue Patient left: in bed;with call bell/phone within reach(MD in room to see pt)  OT Visit Diagnosis: Unsteadiness on feet (R26.81);Other abnormalities of gait and mobility (R26.89);Muscle weakness (generalized) (M62.81);Pain Pain - Right/Left: (bilaterally) Pain - part of body: Knee                Time: 7673-4193 OT Time Calculation (min): 22 min Charges:  OT General Charges $OT Visit: 1 Visit OT Evaluation $OT Eval Moderate Complexity: 1 Mod    Britt Bottom 02/23/2019, 1:33 PM

## 2019-02-23 NOTE — Care Management Important Message (Signed)
Important Message  Patient Details  Name: [name] MRN: [id number] Date of Birth: [birth year]   Medicare Important Message Given:  Yes     Shelda Altes 02/23/2019, 1:36 PM

## 2019-02-23 NOTE — Progress Notes (Signed)
Subjective:  UOP 2100 with max meds,  diuresed 1280-  BUN and crt up again and K down but BP seems better (higher).  I have told him that I do not see this getting better quickly without dialysis- he is agreeable. Consult  In for IR to place [facility]-  I imagine coumadin is the hold up   Objective Vital signs in last 24 hours: Vitals:   02/22/19 2031 02/23/19 0328 02/23/19 0514 02/23/19 0800  BP: 107/71 108/63  106/90  Pulse: 68 69  70  Resp:    16  Temp: 98.1 F (36.7 C) 98.3 F (36.8 C)  97.6 F (36.4 C)  TempSrc: Oral Oral  Oral  SpO2: 96% 97%  97%  Weight:   117.8 kg   Height:       Weight change: -2.404 kg  Intake/Output Summary (Last 24 hours) at 02/23/2019 1028 Last data filed at 02/23/2019 1000 Gross per 24 hour  Intake 520 ml  Output 2350 ml  Net -1830 ml    Assessment/Plan: 63 year old black male with a significant cardiomyopathy and fairly advanced CKD now in trouble with volume overload 1.Renal-patient with moderate CKD at baseline with creatinine in the high twos.  Has suffered acute on chronic renal failure during the course of this hospitalization with appropriate diuresis but also some borderline low blood pressures.  I suspect this injury is hemodynamically mediated.  I agree that  patient is still volume overloaded.  attempting to  try to retain renal perfusion in the setting of attempted diuresis.  I have chosen to start midodrine, decrease sildenafil and max out oral diuretics at torsemide 100 twice daily with metolazone 10 daily.  This has led to some diuresis (not enough) and renal function worsening.  I have told him that I dont see this resolving quickly without diaysis.  I have told patient that cardiomyopathy can make dialysis much more difficult-even not doable in some situations but we would not know until we tried.  He seems very willing to utilize dialysis. Will go ahead and make plans for IR to place [facility] and to start dialysis- maybe tomorrow ??  I attempted to  call wife to notify her of events, no answer.  Access may be a small issue due to coumadin-  Currently has triple lumen in right IJ   2. Hypertension/volume  -volume overloaded as above.  Keep meds for diuresis for now 3. Anemia  -is not to a level that requires treatment at this point 4.  Hypokalemia-continue to replete as needed-status post 40 mEq today- will order 40 TID today    [name]    Labs: Basic Metabolic Panel: Recent Labs  Lab 02/21/19 0525 02/22/19 0500 02/23/19 0545  NA 139 141 139  K 3.2* 3.3* 3.1*  CL 98 97* 94*  CO2 30 30 30   GLUCOSE 107* 108* 107*  BUN 66* 69* 73*  CREATININE 3.84* 4.17* 4.34*  CALCIUM 9.0 9.3 9.4   Liver Function Tests: No results for input(s): AST, ALT, ALKPHOS, BILITOT, PROT, ALBUMIN in the last 168 hours. No results for input(s): LIPASE, AMYLASE in the last 168 hours. No results for input(s): AMMONIA in the last 168 hours. CBC: Recent Labs  Lab 02/19/19 0447 02/20/19 0416 02/21/19 0525 02/22/19 0500 02/23/19 0545  WBC 6.7 7.5 6.7 7.5 7.4  HGB 12.3* 13.2 12.3* 12.3* 12.7*  HCT 40.0 42.3 38.5* 39.0 39.4  MCV 101.5* 100.2* 99.0 99.0 98.0  PLT 182 209 176 209 221  Cardiac Enzymes: No results for input(s): CKTOTAL, CKMB, CKMBINDEX, TROPONINI in the last 168 hours. CBG: Recent Labs  Lab 02/22/19 0826 02/22/19 1146 02/22/19 1631 02/22/19 2139 02/23/19 0807  GLUCAP 99 109* 149* 104* 100*    Iron Studies: No results for input(s): IRON, TIBC, TRANSFERRIN, FERRITIN in the last 72 hours. Studies/Results: No results found. Medications: Infusions: . sodium chloride    . sodium chloride      Scheduled Medications: . amiodarone  200 mg Oral BID  . atorvastatin  80 mg Oral q1800  . Chlorhexidine Gluconate Cloth  6 each Topical Daily  . metolazone  10 mg Oral Daily  . midodrine  10 mg Oral TID WC  . potassium chloride  40 mEq Oral BID  . sildenafil  20 mg Oral BID  . sodium chloride flush  3 mL Intravenous  Q12H  . sodium chloride flush  3 mL Intravenous Q12H  . sodium chloride flush  3 mL Intravenous Q12H  . torsemide  100 mg Oral BID    have reviewed scheduled and prn medications.  Physical Exam: General: very debilitated- does not bode well for prognosis Heart: RRR Lungs: moslty clear Abdomen: obese, soft Extremities: pitting edema - not much change     02/23/2019,10:28 AM  LOS: 10 days

## 2019-02-23 NOTE — Progress Notes (Addendum)
Patient ID: [name], male   DOB: 1955-11-06, 63 y.o.   MRN: [id number]     Advanced Heart Failure Rounding Note  PCP-Cardiologist: Minus Breeding, MD  [facility]: Dr. Haroldine Laws   Subjective:    Planning for HD catheter today. Creatinine continues to rise. Creatinine up 4.3. >2 liters urine output.   Complaining of fatigue. Denies SOB, orthopnea or PND.     Objective:   Weight Range: 117.8 kg Body mass index is 37.25 kg/m.   Vital Signs:   Temp:  [97.4 F (36.3 C)-98.3 F (36.8 C)] 98.3 F (36.8 C) (10/22 0328) Pulse Rate:  [68-71] 69 (10/22 0328) BP: (97-108)/(63-77) 108/63 (10/22 0328) SpO2:  [96 %-97 %] 97 % (10/22 0328) Weight:  [117.8 kg] 117.8 kg (10/22 0514) Last BM Date: 02/22/19  Weight change: Filed Weights   02/21/19 0516 02/22/19 0100 02/23/19 0514  Weight: 122.2 kg 120.2 kg 117.8 kg    Intake/Output:   Intake/Output Summary (Last 24 hours) at 02/23/2019 0658 Last data filed at 02/23/2019 0865 Gross per 24 hour  Intake 820 ml  Output 2100 ml  Net -1280 ml      Physical Exam  CVP 12 General:  No resp difficulty HEENT: normal Neck: supple. JVP 11-12 . Carotids 2+ bilat; no bruits. No lymphadenopathy or thryomegaly appreciated. Cor: PMI nondisplaced. Regular rate & rhythm. No rubs, gallops. 2/6 TR   Lungs: clear Abdomen: soft, nontender, nondistended. No hepatosplenomegaly. No bruits or masses. Good bowel sounds. Extremities: no cyanosis, clubbing, rash, R and LLE 3+ edema Neuro: alert & orientedx3, cranial nerves grossly intact. moves all 4 extremities w/o difficulty. Affect flat   Telemetry   SR     EKG    N/A  Labs    CBC Recent Labs    02/22/19 0500 02/23/19 0545  WBC 7.5 7.4  HGB 12.3* 12.7*  HCT 39.0 39.4  MCV 99.0 98.0  PLT 209 784   Basic Metabolic Panel Recent Labs    02/22/19 0500 02/23/19 0545  NA 141 139  K 3.3* 3.1*  CL 97* 94*  CO2 30 30  GLUCOSE 108* 107*  BUN 69* 73*  CREATININE 4.17* 4.34*   CALCIUM 9.3 9.4  MG 2.5* 2.3   Liver Function Tests No results for input(s): AST, ALT, ALKPHOS, BILITOT, PROT, ALBUMIN in the last 72 hours. No results for input(s): LIPASE, AMYLASE in the last 72 hours. Cardiac Enzymes No results for input(s): CKTOTAL, CKMB, CKMBINDEX, TROPONINI in the last 72 hours.  BNP: BNP (last 3 results) Recent Labs    07/19/18 1106 01/02/19 1552 02/13/19 1114  BNP 1,608.5* 2,154.7* 2,637.3*    ProBNP (last 3 results) Recent Labs    11/01/18 1229  PROBNP 2,158.0*     D-Dimer No results for input(s): DDIMER in the last 72 hours. Hemoglobin A1C No results for input(s): HGBA1C in the last 72 hours. Fasting Lipid Panel No results for input(s): CHOL, HDL, LDLCALC, TRIG, CHOLHDL, LDLDIRECT in the last 72 hours. Thyroid Function Tests No results for input(s): TSH, T4TOTAL, T3FREE, THYROIDAB in the last 72 hours.  Invalid input(s): FREET3  Other results:   Imaging    No results found.   Medications:     Scheduled Medications: . amiodarone  200 mg Oral BID  . atorvastatin  80 mg Oral q1800  . Chlorhexidine Gluconate Cloth  6 each Topical Daily  . metolazone  10 mg Oral Daily  . midodrine  10 mg Oral TID WC  . potassium chloride  40 mEq Oral BID  . sildenafil  20 mg Oral BID  . sodium chloride flush  3 mL Intravenous Q12H  . sodium chloride flush  3 mL Intravenous Q12H  . sodium chloride flush  3 mL Intravenous Q12H  . torsemide  100 mg Oral BID    Infusions: . sodium chloride    . sodium chloride      PRN Medications: sodium chloride, sodium chloride, acetaminophen, loperamide, ondansetron (ZOFRAN) IV, sodium chloride flush, sodium chloride flush    Patient Profile   63 y/o male with severe systolic HF with biventricular dysfunction due to NICM and advanced CKD. Recently hospitalized with low output HF. Now returns with progressive NYHA IV symptoms with marked fluid overload not responding to titration of oral diuretic  regimen. With CKD 4, biventricular HF and morbid obesity advanced options are quite limited.  Assessment/Plan    1. Acute on Chronic Systolic HF with biventricular dysfunction due to NICM: mild nonobstructive CAD on [facility] 12/2018. CM out of proportion of CAD. PYP scan and myeloma panel completed and were not suggestive of cardiac amyloidosis. S/p St Jude ICD. ECHO 07/28/2017 EF 20-25%. RV mildly dilated. Echo 11/17/18 EF 20-25% RV dilated and severely HK with septal flattening. Presented to ED 10/12 w/ NYHA IV symptoms and markedly volume overload w/ 3+ bilateral LEE, left basilar rales and elevated JVD to level of ear. Admit Wt up 10+ lb from home baseline. BNP 2600 on admit.  -Poor initial response to high dose IV diuretics w/ increase in SCr from  2.76>>3.30, prompting initiation of inotropic support, started on milrinone. Milrinone later stopped due to ventricular ectopy.  - [place] 10/15 with R sided HF and PAH.  - Co-ox stable off milrinone  - Off sildenafil due to low BP -  Renal planning placement of  HD catheter today. Remains volume overloaded.   -He continues to deteriorate with biventricular dysfunction/PAH and progressive CKD. Only real option seems to be consideration of heart/kidney transplant but Body mass index is 37.25 kg/m. which is prohibitive.   2. CKD stage 3 - Baseline creatinine ~1.9.   Cr continues to rise. Admit creatinine 2.8. Today's creatinine 4.34.  - Suspect cardiorenal syndrome -Nephrology appreciated - Plan placement of  HD catheter today.   3. CAD - LHC8/20with LAD 20%50% distal left circ and 20% mid RCA stenosis. No significant disease in left main or LAD. - No s/s ischemia Hs Trop 284 but likely demand ischemia in the setting of a/c CHF and abnormal renal function.   - Continue statin. - Hold ? blocker due to soft BP/ concern for low output - No ASA due to chronic coumadin  4. PAF  -s/p recent DC-CV for AFL.  - Continue amio 200 mg bid, he is in NSR.     - On coumadin, followed by coumadin clinic on [place].    6. [place]ard ICD.  -Had VT episode (rate 190 bpm) w/ delivery of shock on 02/07/19  -Started on IV amiodarone 10/14 w/ improvement, NSVT less frequent off milrinone.  - Continue amiodarone 200 mg po twice a day.  -Keep K >4.0 and Mg > 2.0 .  7. Pulmonary HTN - On home O2 (2L).  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 Had negative V/Q scan ruling out CTEPH.  - RHC PVR 6.5. Started on sildenafil 20 mg three times a day.  - Off sildenafil due to low BP and need to preserve renal perfusion  8. Hypokalemia: K 3.1 Supp K     Length of Stay: [place], NP  02/23/2019, 6:58 AM  Advanced Heart Failure Team Pager [phone] (M-F; [place])  Please contact [facility] for night-coverage after hours (4p -7a ) and weekends on [email or web address]  Patient seen and examined with the above-signed Advanced Practice Provider and/or Housestaff. I personally reviewed laboratory data, imaging studies and relevant notes. I independently examined the patient and formulated the important aspects of the plan. I have edited the note to reflect any of my changes or salient points. I have personally discussed the plan with the patient and/or family.  Good diuresis on high-dose oral diuretics but creatinine continues to rise. Now 4.3. CVP remains elevated at 12 with at least 2+ edema. For [facility] today to permit HD initiation. Will supp K gently. Currently not candidate for heart-kidney transplant eval due to BMI. Can consider down the road but I think his chances are likely small.   Glori Bickers, MD  8:14 AM

## 2019-02-23 NOTE — Progress Notes (Signed)
Patient ID: BRETT DARKO, male   DOB: 06-Jun-1955, 63 y.o.   MRN: [id number]  This NP visited patient at the bedside as a follow up for palliaitve medicine needs and emotional support.  Patient is out of bed to the chair and appears comfortable.    Attempt to illicit conversation  regarding his current medical situation, treatment options, disposition and anticipatory care needs  He is reluctant to engage in conversation and tells me the doctors are "letting him know what to do"  We did speak to the fact that he is moving forward with initiation of dialysis.    He remains open to all offered and available medical interventions to prolong life.  I am concern that Mr Nakanishi has limited insight into the seriousness of his medical condition and limitations of viable medical interventions available to him.   Discussed with patient the importance of continued conversation with his family and the medical providers regarding overall plan of care and treatment options,  ensuring decisions are within the context of the patients values and GOCs.  Questions and concerns addressed   Case discussed with Ellen Henri yesterday   Total time spent on the unit was 25 minutes  Greater than 50% of the time was spent in counseling and coordination of care  Wadie Lessen NP  Palliative Medicine Team Team Phone # [phone] Pager [phone]

## 2019-02-23 NOTE — Progress Notes (Signed)
1140 We are following PT/OT progress with pt. Noted pt for [facility] today and OT to see. Will continue to follow. Graylon Good RN BSN 02/23/2019 11:38 AM

## 2019-02-23 NOTE — Progress Notes (Signed)
Request to IR for tunneled HD catheter placement - patient previously on Coumadin which has been held in anticipation of procedure. INR checked today and is 2.0 -- will recheck INR tomorrow and if ~1.5 or less will proceed with tunneled HD catheter placement. I have placed orders for patient to be NPO after midnight tonight in anticipation of proceeding tomorrow.  IR PA will follow up in the morning tomorrow.  Please call IR with any questions or concerns.

## 2019-02-24 ENCOUNTER — Encounter (HOSPITAL_COMMUNITY): Payer: Medicare Other | Admitting: Internal Medicine

## 2019-02-24 ENCOUNTER — Inpatient Hospital Stay (HOSPITAL_COMMUNITY): Payer: Medicare Other

## 2019-02-24 ENCOUNTER — Encounter (HOSPITAL_COMMUNITY): Payer: Self-pay | Admitting: Interventional Radiology

## 2019-02-24 DIAGNOSIS — I5043 Acute on chronic combined systolic (congestive) and diastolic (congestive) heart failure: Secondary | ICD-10-CM | POA: Diagnosis not present

## 2019-02-24 HISTORY — PX: IR US GUIDE VASC ACCESS RIGHT: IMG2390

## 2019-02-24 HISTORY — PX: IR FLUORO GUIDE CV LINE RIGHT: IMG2283

## 2019-02-24 LAB — BASIC METABOLIC PANEL
Anion gap: 13 (ref 5–15)
BUN: 75 mg/dL — ABNORMAL HIGH (ref 8–23)
CO2: 32 mmol/L (ref 22–32)
Calcium: 9.5 mg/dL (ref 8.9–10.3)
Chloride: 95 mmol/L — ABNORMAL LOW (ref 98–111)
Creatinine, Ser: 4.53 mg/dL — ABNORMAL HIGH (ref 0.61–1.24)
GFR calc Af Amer: 15 mL/min — ABNORMAL LOW (ref >60)
GFR calc non Af Amer: 13 mL/min — ABNORMAL LOW (ref >60)
Glucose, Bld: 97 mg/dL (ref 70–99)
Potassium: 3.3 mmol/L — ABNORMAL LOW (ref 3.5–5.1)
Sodium: 140 mmol/L (ref 135–145)

## 2019-02-24 LAB — COOXEMETRY PANEL
Carboxyhemoglobin: 1.9 % — ABNORMAL HIGH (ref 0.5–1.5)
Methemoglobin: 1 % (ref 0.0–1.5)
O2 Saturation: 68.7 %
Total hemoglobin: 12.7 g/dL (ref 12.0–16.0)

## 2019-02-24 LAB — CBC
HCT: 37.6 % — ABNORMAL LOW (ref 39.0–52.0)
Hemoglobin: 12.5 g/dL — ABNORMAL LOW (ref 13.0–17.0)
MCH: 32 pg (ref 26.0–34.0)
MCHC: 33.2 g/dL (ref 30.0–36.0)
MCV: 96.2 fL (ref 80.0–100.0)
Platelets: 212 10*3/uL (ref 150–400)
RBC: 3.91 MIL/uL — ABNORMAL LOW (ref 4.22–5.81)
RDW: 16.3 % — ABNORMAL HIGH (ref 11.5–15.5)
WBC: 6.9 10*3/uL (ref 4.0–10.5)
nRBC: 0 % (ref 0.0–0.2)

## 2019-02-24 LAB — GLUCOSE, CAPILLARY
Glucose-Capillary: 109 mg/dL — ABNORMAL HIGH (ref 70–99)
Glucose-Capillary: 94 mg/dL (ref 70–99)
Glucose-Capillary: 105 mg/dL — ABNORMAL HIGH (ref 70–99)
Glucose-Capillary: 114 mg/dL — ABNORMAL HIGH (ref 70–99)

## 2019-02-24 LAB — MAGNESIUM: Magnesium: 2.3 mg/dL (ref 1.7–2.4)

## 2019-02-24 LAB — HEPATITIS B SURFACE ANTIGEN: Hepatitis B Surface Ag: NONREACTIVE

## 2019-02-24 LAB — HEPATITIS B CORE ANTIBODY, IGM: Hep B C IgM: NONREACTIVE

## 2019-02-24 LAB — PROTIME-INR
INR: 1.9 — ABNORMAL HIGH (ref 0.8–1.2)
Prothrombin Time: 21.9 seconds — ABNORMAL HIGH (ref 11.4–15.2)

## 2019-02-24 LAB — HEPATITIS B SURFACE ANTIBODY,QUALITATIVE: Hep B S Ab: NONREACTIVE

## 2019-02-24 MED ORDER — CEFAZOLIN SODIUM-DEXTROSE 2-4 GM/100ML-% IV SOLN
INTRAVENOUS | Status: AC
  Filled 2019-02-24: qty 100

## 2019-02-24 MED ORDER — MIDAZOLAM HCL 2 MG/2ML IJ SOLN
INTRAMUSCULAR | Status: AC
  Filled 2019-02-24: qty 2

## 2019-02-24 MED ORDER — HEPARIN SODIUM (PORCINE) 1000 UNIT/ML IJ SOLN
INTRAMUSCULAR | Status: AC
  Filled 2019-02-24: qty 1

## 2019-02-24 MED ORDER — FENTANYL CITRATE (PF) 100 MCG/2ML IJ SOLN
INTRAMUSCULAR | Status: AC | PRN
  Administered 2019-02-24: 25 ug via INTRAVENOUS

## 2019-02-24 MED ORDER — MIDAZOLAM HCL 2 MG/2ML IJ SOLN
INTRAMUSCULAR | Status: AC | PRN
  Administered 2019-02-24 (×2): 0.5 mg via INTRAVENOUS

## 2019-02-24 MED ORDER — CEFAZOLIN SODIUM-DEXTROSE 2-4 GM/100ML-% IV SOLN
2.0000 g | Freq: Once | INTRAVENOUS | Status: AC
  Administered 2019-02-24: 15:00:00 2 g via INTRAVENOUS

## 2019-02-24 MED ORDER — HEPARIN SODIUM (PORCINE) 1000 UNIT/ML IJ SOLN
INTRAMUSCULAR | Status: AC
  Administered 2019-02-24: 3200 [IU] via INTRAVENOUS_CENTRAL
  Filled 2019-02-24: qty 4

## 2019-02-24 MED ORDER — WARFARIN SODIUM 5 MG PO TABS
5.0000 mg | ORAL_TABLET | Freq: Once | ORAL | Status: AC
  Administered 2019-02-24: 17:00:00 5 mg via ORAL
  Filled 2019-02-24: qty 1

## 2019-02-24 MED ORDER — ALTEPLASE 2 MG IJ SOLR: 2.0000 mg | Freq: Once | INTRAMUSCULAR | Status: DC | PRN

## 2019-02-24 MED ORDER — LIDOCAINE HCL 1 % IJ SOLN
INTRAMUSCULAR | Status: AC
  Filled 2019-02-24: qty 20

## 2019-02-24 MED ORDER — LIDOCAINE HCL 1 % IJ SOLN
INTRAMUSCULAR | Status: AC | PRN
  Administered 2019-02-24 (×2): 10 mL

## 2019-02-24 MED ORDER — SODIUM CHLORIDE 0.9 % IV SOLN: 100.0000 mL | INTRAVENOUS | Status: DC | PRN

## 2019-02-24 MED ORDER — WARFARIN - PHARMACIST DOSING INPATIENT: Freq: Every day | Status: DC

## 2019-02-24 MED ORDER — FENTANYL CITRATE (PF) 100 MCG/2ML IJ SOLN
INTRAMUSCULAR | Status: AC
  Filled 2019-02-24: qty 2

## 2019-02-24 MED ORDER — HEPARIN SODIUM (PORCINE) 1000 UNIT/ML DIALYSIS
1000.0000 [IU] | INTRAMUSCULAR | Status: DC | PRN
  Administered 2019-02-24: 22:00:00 3200 [IU] via INTRAVENOUS_CENTRAL
  Filled 2019-02-24 (×2): qty 1

## 2019-02-24 NOTE — Progress Notes (Addendum)
ANTICOAGULATION CONSULT NOTE - Follow Up Consult  Pharmacy Consult for Warfarin Indication: atrial fibrillation  No Known Allergies  Patient Measurements: Height: 5\' 10"  (177.8 cm) Weight: 254 lb 9.6 oz (115.5 kg) IBW/kg (Calculated) : 73  Vital Signs: Temp: 97.9 F (36.6 C) (10/23 0452) Temp Source: Oral (10/23 0452) BP: 105/66 (10/23 0452) Pulse Rate: 68 (10/23 0452)  Labs: Recent Labs    02/22/19 0500 02/23/19 0545 02/24/19 0526  HGB 12.3* 12.7* 12.5*  HCT 39.0 39.4 37.6*  PLT 209 221 212  LABPROT 24.5* 22.3* 21.9*  INR 2.2* 2.0* 1.9*  CREATININE 4.17* 4.34* 4.53*    Estimated Creatinine Clearance: 21.5 mL/min (A) (by C-G formula based on SCr of 4.53 mg/dL (H)).   Medical History: Past Medical History:  Diagnosis Date  . CAD (coronary artery disease)    a. Initial nonobst by cath 2009. b. Cath 01/2013 in setting of VT storm: obstructive distal Cx disease (small and terminates in the AV groove, unlikely to cause significant ischemia or electrical instability), nonobstructive RCA disease, EF 15-20%.   . Cerebrovascular accident [facility])    a. 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 Chronic systolic CHF (congestive heart failure) ([place])    a. Likely NICM (out of proportion to CAD). b. 2009 - EF 25-30% by echo, 01/2013: 15-20% by cath. c. 11/2014 Echo: EF 35-40%, Gr1 DD, mild MR, mod TR, PASP 27mmHg.  . CKD (chronic kidney disease), stage II   . Dyslipidemia   . Gout   . HTN (hypertension)   . Hypokalemia   . ICD (implantable cardiac defibrillator) in place   . Insulin dependent diabetes mellitus   . Lipoma   . Nonischemic cardiomyopathy ([place])    a.  11/2014 Echo: EF 35-40%, Gr1 DD, mild MR, mod TR, PASP 34mmHg.  . OSA (obstructive sleep apnea)    does not wear cpap  . PAF (paroxysmal atrial fibrillation) ([place])    a. Noted 05/2008 by EKG;  b. CHA2DS2VASc = 5-6-->coumadin.  . Paroxysmal VT ([place])    a. s/p St. Jude ICD 2007. b. H/o paroxysmal VT/VF including VT storm  12/2012 admission prompting amio initiation;  c. 01/2013 ICD upgrade SJM [id number] Ell[place] VR single lead ICD.  [name] Pulmonary HTN ([place])    a. Mild by cath 01/2013.    Medications:  Scheduled:  . amiodarone  200 mg Oral BID  . atorvastatin  80 mg Oral q1800  . Chlorhexidine Gluconate Cloth  6 each Topical Daily  . Chlorhexidine Gluconate Cloth  6 each Topical Q0600  . metolazone  10 mg Oral Daily  . midodrine  10 mg Oral TID WC  . potassium chloride  40 mEq Oral TID  . sildenafil  20 mg Oral BID  . sodium chloride flush  10 mL Intravenous Q12H  . torsemide  100 mg Oral BID   Infusions:  . sodium chloride      Assessment: Pt is a 63 y/o male with PMH of CHF (EF 20-25%), prior VT s/p St Jude ICD, CAD, CVA, OSA on CPAP, pulmonary HTN, HTN, DM, CKDs stage III, and PAF on coumadin PTA (5mg  daily, except 2.5 mg on fridays). Pt presents to the ED with volume overload diuresing with IV furosemide. Pharmacy has been consulted to dose warfarin.  INR today 1.9 after holding warfarin x2. Discussed with HF MD, will hold off on heparin infusion per now but will reconsider if INR drops further tomorrow. IR to place HD cath today - ok to resume warfarin  tonight.   Goal of Therapy:  INR 2-3 Monitor platelets by anticoagulation protocol: Yes   Plan:  -Warfarin 5mg  PO x1 tonight -Daily INR    Arrie Senate, PharmD, BCPS Clinical Pharmacist [phone] Please check AMION for all [facility] Pharmacy numbers 02/24/2019

## 2019-02-24 NOTE — Progress Notes (Signed)
PT Cancellation Note  Patient Details Name: [name] MRN: [id number] DOB: [birth year]   Cancelled Treatment:    Reason Eval/Treat Not Completed: Patient at procedure or test/unavailable Pt off unit for HD cath placement. PT will continue to follow acutely.    Earney Navy, PTA Acute Rehabilitation Services Pager: [phone] Office: [phone]   02/24/2019, 3:01 PM

## 2019-02-24 NOTE — Progress Notes (Signed)
Patient ID: [name], male   DOB: May 08, 1955, 63 y.o.   MRN: [id number]     Advanced Heart Failure Rounding Note  PCP-Cardiologist: Minus Breeding, MD  [facility]: Dr. Haroldine Laws   Subjective:    RIJ HD cath placed today. Plan for HD start tonight. Denis SOB, orthopnea or PND. Still with 3+ edema   Objective:   Weight Range: 115.5 kg Body mass index is 36.53 kg/m.   Vital Signs:   Temp:  [97.6 F (36.4 C)-98.2 F (36.8 C)] 97.6 F (36.4 C) (10/23 1636) Pulse Rate:  [61-68] 61 (10/23 1636) Resp:  [15-20] 18 (10/23 1636) BP: (100-126)/(66-99) 110/83 (10/23 1636) SpO2:  [67 %-99 %] 94 % (10/23 1636) Weight:  [115.5 kg] 115.5 kg (10/23 0507) Last BM Date: 02/23/19  Weight change: Filed Weights   02/22/19 0100 02/23/19 0514 02/24/19 0507  Weight: 120.2 kg 117.8 kg 115.5 kg    Intake/Output:   Intake/Output Summary (Last 24 hours) at 02/24/2019 1720 Last data filed at 02/24/2019 1600 Gross per 24 hour  Intake 460 ml  Output 3355 ml  Net -2895 ml      Physical Exam   General:  Lying in bed . No resp difficulty HEENT: normal Neck: supple. JVP to jaw RIJ TDC. Carotids 2+ bilat; no bruits. No lymphadenopathy or thryomegaly appreciated. Cor: PMI nondisplaced. Regular rate & rhythm. No rubs, gallops or murmurs. Lungs: clear Abdomen: obese soft, nontender, nondistended. No hepatosplenomegaly. No bruits or masses. Good bowel sounds. Extremities: no cyanosis, clubbing, rash, 3+ edema +UNNA boots Neuro: alert & orientedx3, cranial nerves grossly intact. moves all 4 extremities w/o difficulty. Affect pleasant   Telemetry   SR 60-70s Personally reviewed   EKG    N/A  Labs    CBC Recent Labs    02/23/19 0545 02/24/19 0526  WBC 7.4 6.9  HGB 12.7* 12.5*  HCT 39.4 37.6*  MCV 98.0 96.2  PLT 221 725   Basic Metabolic Panel Recent Labs    02/23/19 0545 02/24/19 0526  NA 139 140  K 3.1* 3.3*  CL 94* 95*  CO2 30 32  GLUCOSE 107* 97  BUN 73* 75*    CREATININE 4.34* 4.53*  CALCIUM 9.4 9.5  MG 2.3 2.3   Liver Function Tests No results for input(s): AST, ALT, ALKPHOS, BILITOT, PROT, ALBUMIN in the last 72 hours. No results for input(s): LIPASE, AMYLASE in the last 72 hours. Cardiac Enzymes No results for input(s): CKTOTAL, CKMB, CKMBINDEX, TROPONINI in the last 72 hours.  BNP: BNP (last 3 results) Recent Labs    07/19/18 1106 01/02/19 1552 02/13/19 1114  BNP 1,608.5* 2,154.7* 2,637.3*    ProBNP (last 3 results) Recent Labs    11/01/18 1229  PROBNP 2,158.0*     D-Dimer No results for input(s): DDIMER in the last 72 hours. Hemoglobin A1C No results for input(s): HGBA1C in the last 72 hours. Fasting Lipid Panel No results for input(s): CHOL, HDL, LDLCALC, TRIG, CHOLHDL, LDLDIRECT in the last 72 hours. Thyroid Function Tests No results for input(s): TSH, T4TOTAL, T3FREE, THYROIDAB in the last 72 hours.  Invalid input(s): FREET3  Other results:   Imaging    Ir Fluoro Guide Cv Line Right  Result Date: 02/24/2019 INDICATION: End-stage renal disease. In need of durable intravenous access for the initiation of hemodialysis Patient currently has a non tunneled right jugular approach central venous catheter however given the presence of the left anterior chest wall AICD/pacemaker the decision was made to proceed with right internal  jugular approach dialysis catheter placement with access caudal to existing non tunneled central venous catheter. EXAM: TUNNELED CENTRAL VENOUS HEMODIALYSIS CATHETER PLACEMENT WITH ULTRASOUND AND FLUOROSCOPIC GUIDANCE MEDICATIONS: Ancef 2 gm IV . The antibiotic was given in an appropriate time interval prior to skin puncture. ANESTHESIA/SEDATION: Versed 1 mg IV; Fentanyl 25 mcg IV; Moderate Sedation Time:  14 The patient was continuously monitored during the procedure by the interventional radiology nurse under my direct supervision. FLUOROSCOPY TIME:  1 minute (19 mGy) COMPLICATIONS: None  immediate. PROCEDURE: Informed written consent was obtained from the patient after a discussion of the risks, benefits, and alternatives to treatment. Questions regarding the procedure were encouraged and answered. The right neck and chest were prepped with chlorhexidine in a sterile fashion, and a sterile drape was applied covering the operative field. Maximum barrier sterile technique with sterile gowns and gloves were used for the procedure. A timeout was performed prior to the initiation of the procedure. After creating a small venotomy incision, a micropuncture kit was utilized to access the internal jugular vein. Real-time ultrasound guidance was utilized for vascular access including the acquisition of a permanent ultrasound image documenting patency of the accessed vessel. The microwire was utilized to measure appropriate catheter length. A stiff Glidewire was advanced to the level of the IVC and the micropuncture sheath was exchanged for a peel-away sheath. A palindrome tunneled hemodialysis catheter measuring 19 cm from tip to cuff was tunneled in a retrograde fashion from the anterior chest wall to the venotomy incision. The catheter was then placed through the peel-away sheath with tips ultimately positioned within the superior aspect of the right atrium. Final catheter positioning was confirmed and documented with a spot radiographic image. The catheter aspirates and flushes normally. The catheter was flushed with appropriate volume heparin dwells. The catheter exit site was secured with a 0-Prolene retention suture. The venotomy incision was closed with Dermabond and Steri-strips. Dressings were applied. The patient tolerated the procedure well without immediate post procedural complication. IMPRESSION: Successful placement of 19 cm tip to cuff tunneled hemodialysis catheter via the right internal jugular vein with tips terminating within the superior aspect of the right atrium. The catheter is ready  for immediate use. Electronically Signed   By: Sandi Mariscal M.D.   On: 02/24/2019 16:33   Ir US Guide Vasc Access Right  Result Date: 02/24/2019 INDICATION: End-stage renal disease. In need of durable intravenous access for the initiation of hemodialysis Patient currently has a non tunneled right jugular approach central venous catheter however given the presence of the left anterior chest wall AICD/pacemaker the decision was made to proceed with right internal jugular approach dialysis catheter placement with access caudal to existing non tunneled central venous catheter. EXAM: TUNNELED CENTRAL VENOUS HEMODIALYSIS CATHETER PLACEMENT WITH ULTRASOUND AND FLUOROSCOPIC GUIDANCE MEDICATIONS: Ancef 2 gm IV . The antibiotic was given in an appropriate time interval prior to skin puncture. ANESTHESIA/SEDATION: Versed 1 mg IV; Fentanyl 25 mcg IV; Moderate Sedation Time:  14 The patient was continuously monitored during the procedure by the interventional radiology nurse under my direct supervision. FLUOROSCOPY TIME:  1 minute (19 mGy) COMPLICATIONS: None immediate. PROCEDURE: Informed written consent was obtained from the patient after a discussion of the risks, benefits, and alternatives to treatment. Questions regarding the procedure were encouraged and answered. The right neck and chest were prepped with chlorhexidine in a sterile fashion, and a sterile drape was applied covering the operative field. Maximum barrier sterile technique with sterile gowns and gloves were  used for the procedure. A timeout was performed prior to the initiation of the procedure. After creating a small venotomy incision, a micropuncture kit was utilized to access the internal jugular vein. Real-time ultrasound guidance was utilized for vascular access including the acquisition of a permanent ultrasound image documenting patency of the accessed vessel. The microwire was utilized to measure appropriate catheter length. A stiff Glidewire was  advanced to the level of the IVC and the micropuncture sheath was exchanged for a peel-away sheath. A palindrome tunneled hemodialysis catheter measuring 19 cm from tip to cuff was tunneled in a retrograde fashion from the anterior chest wall to the venotomy incision. The catheter was then placed through the peel-away sheath with tips ultimately positioned within the superior aspect of the right atrium. Final catheter positioning was confirmed and documented with a spot radiographic image. The catheter aspirates and flushes normally. The catheter was flushed with appropriate volume heparin dwells. The catheter exit site was secured with a 0-Prolene retention suture. The venotomy incision was closed with Dermabond and Steri-strips. Dressings were applied. The patient tolerated the procedure well without immediate post procedural complication. IMPRESSION: Successful placement of 19 cm tip to cuff tunneled hemodialysis catheter via the right internal jugular vein with tips terminating within the superior aspect of the right atrium. The catheter is ready for immediate use. Electronically Signed   By: Sandi Mariscal M.D.   On: 02/24/2019 16:33     Medications:     Scheduled Medications:  amiodarone  200 mg Oral BID   atorvastatin  80 mg Oral q1800   Chlorhexidine Gluconate Cloth  6 each Topical Daily   fentaNYL       heparin       lidocaine       metolazone  10 mg Oral Daily   midazolam       midodrine  10 mg Oral TID WC   potassium chloride  40 mEq Oral TID   sildenafil  20 mg Oral BID   sodium chloride flush  10 mL Intravenous Q12H   torsemide  100 mg Oral BID   warfarin  5 mg Oral ONCE-1800   Warfarin - Pharmacist Dosing Inpatient   Does not apply q1800    Infusions:  sodium chloride     ceFAZolin      PRN Medications: sodium chloride, acetaminophen, loperamide, ondansetron (ZOFRAN) IV, sodium chloride flush    Patient Profile   63 y/o male with severe systolic HF  with biventricular dysfunction due to NICM and advanced CKD. Recently hospitalized with low output HF. Now returns with progressive NYHA IV symptoms with marked fluid overload not responding to titration of oral diuretic regimen. With CKD 4, biventricular HF and morbid obesity advanced options are quite limited.  Assessment/Plan    1. Acute on Chronic Systolic HF with biventricular dysfunction due to NICM: mild nonobstructive CAD on [facility] 12/2018. CM out of proportion of CAD. PYP scan and myeloma panel completed and were not suggestive of cardiac amyloidosis. S/p St Jude ICD. ECHO 07/28/2017 EF 20-25%. RV mildly dilated. Echo 11/17/18 EF 20-25% RV dilated and severely HK with septal flattening. Presented to ED 10/12 w/ NYHA IV symptoms and markedly volume overload w/ 3+ bilateral LEE, left basilar rales and elevated JVD to level of ear. Admit Wt up 10+ lb from home baseline. BNP 2600 on admit.  -Poor initial response to high dose IV diuretics w/ increase in SCr from  2.76>>3.30, prompting initiation of inotropic support, started on milrinone. Milrinone  later stopped due to ventricular ectopy.  - [place] 10/15 with R sided HF and PAH.  - Now s/p HD catheter placment. Remains volume overloaded.  HD today -He continues to deteriorate with biventricular dysfunction/PAH and progressive CKD. Only real option seems to be consideration of heart/kidney transplant but Body mass index is 36.53 kg/m. which is prohibitive.   2. CKD stage 3 - Baseline creatinine ~1.9.   Cr continues to rise. Admit creatinine 2.8. Today's creatinine 4.34-> 4.5  - Suspect cardiorenal syndrome -Nephrology appreciated - Plan HD start today through [facility]  3. CAD - LHC8/20with LAD 20%50% distal left circ and 20% mid RCA stenosis. No significant disease in left main or LAD. - No s/s ischemia Hs Trop 284 but likely demand ischemia in the setting of a/c CHF and abnormal renal function.   - Continue statin. - Hold ? blocker due to soft  BP/ concern for low output - No ASA due to chronic coumadin  4. PAF  -s/p recent DC-CV for AFL.  - Continue amio 200 mg bid, he is in NSR.    - Coumadin held for [facility] Ok to restart.  Can start heparin as needed tomorrow >dbph  6. VT -Has St Jude ICD.  -Had VT episode (rate 190 bpm) w/ delivery of shock on 02/07/19  -Started on IV amiodarone 10/14 w/ improvement, NSVT less frequent off milrinone.  - Continue amiodarone 200 mg po twice a day.  -Keep K >4.0 and Mg > 2.0 .  7. Pulmonary HTN - On home O2 (2L).  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 Had negative V/Q scan ruling out CTEPH.  - RHC PVR 6.5. Started on sildenafil 20 mg three times a day.  - Off sildenafil due to low BP and need to preserve renal perfusion  8. Hypokalemia: K 3.3 Supp K     Length of Stay: 11  Glori Bickers, MD  02/24/2019, 5:20 PM  Advanced Heart Failure Team Pager [phone] (M-F; [place])  Please contact [facility] for night-coverage after hours (4p -7a ) and weekends on [email or web address]

## 2019-02-24 NOTE — Procedures (Signed)
Pre-procedure Diagnosis: ESRD Post-procedure Diagnosis: Same  Successful placement of tunneled HD catheter with tips terminating within the superior aspect of the right atrium.    Complications: None Immediate  EBL: Minimal   The catheter is ready for immediate use.   Jay Tawana Pasch, MD Pager #: [phone]   

## 2019-02-24 NOTE — Progress Notes (Signed)
[facility]    NEPHROLOGY PROGRESS NOTE  SUBJECTIVE: Patient seen and examined earlier today.  Denies chest pain or shortness of breath.  Continues to have lower extremity edema.  Denies fevers, chills, nausea, vomiting, diarrhea or dysuria.  All other review of systems are negative.  For tunneled dialysis catheter and first dialysis treatment today.  OBJECTIVE:  Vitals:   02/24/19 1600 02/24/19 1636  BP: 105/72 110/83  Pulse: 65 61  Resp: 16 18  Temp:  97.6 F (36.4 C)  SpO2: 95% 94%    Intake/Output Summary (Last 24 hours) at 02/24/2019 1701 Last data filed at 02/24/2019 1600 Gross per 24 hour  Intake 460 ml  Output 3355 ml  Net -2895 ml      General:  AAOx3 NAD HEENT: MMM [place] AT anicteric sclera Neck:  No JVD, no adenopathy CV:  Heart RRR  Lungs:  L/S CTA bilaterally Abd:  abd SNT/ND with normal BS GU:  Bladder non-palpable Extremities: +3 bilateral lower extremity edema Skin:  No skin rash  MEDICATIONS:  . amiodarone  200 mg Oral BID  . atorvastatin  80 mg Oral q1800  . Chlorhexidine Gluconate Cloth  6 each Topical Daily  . Chlorhexidine Gluconate Cloth  6 each Topical Q0600  . fentaNYL      . heparin      . lidocaine      . metolazone  10 mg Oral Daily  . midazolam      . midodrine  10 mg Oral TID WC  . potassium chloride  40 mEq Oral TID  . sildenafil  20 mg Oral BID  . sodium chloride flush  10 mL Intravenous Q12H  . torsemide  100 mg Oral BID  . warfarin  5 mg Oral ONCE-1800  . Warfarin - Pharmacist Dosing Inpatient   Does not apply q1800       LABS:   CBC Latest Ref Rng & Units 02/24/2019 02/23/2019 02/22/2019  WBC 4.0 - 10.5 K/uL 6.9 7.4 7.5  Hemoglobin 13.0 - 17.0 g/dL 12.5(L) 12.7(L) 12.3(L)  Hematocrit 39.0 - 52.0 % 37.6(L) 39.4 39.0  Platelets 150 - 400 K/uL 212 221 209    CMP Latest Ref Rng & Units 02/24/2019 02/23/2019 02/22/2019  Glucose 70 - 99 mg/dL 97 107(H) 108(H)  BUN 8 - 23 mg/dL 75(H) 73(H) 69(H)  Creatinine  0.61 - 1.24 mg/dL 4.53(H) 4.34(H) 4.17(H)  Sodium 135 - 145 mmol/L 140 139 141  Potassium 3.5 - 5.1 mmol/L 3.3(L) 3.1(L) 3.3(L)  Chloride 98 - 111 mmol/L 95(L) 94(L) 97(L)  CO2 22 - 32 mmol/L 32 30 30  Calcium 8.9 - 10.3 mg/dL 9.5 9.4 9.3  Total Protein 6.5 - 8.1 g/dL - - -  Total Bilirubin 0.3 - 1.2 mg/dL - - -  Alkaline Phos 38 - 126 U/L - - -  AST 15 - 41 U/L - - -  ALT 0 - 44 U/L - - -    Lab Results  Component Value Date   CALCIUM 9.5 02/24/2019   CAION 1.12 (L) 02/16/2019   PHOS 4.9 (H) 01/04/2019       Component Value Date/Time   COLORURINE YELLOW 01/02/2019 2027   APPEARANCEUR CLEAR 01/02/2019 2027   LABSPEC 1.010 01/02/2019 2027   [place] 5.0 01/02/2019 2027   GLUCOSEU NEGATIVE 01/02/2019 2027   GLUCOSEU NEGATIVE 07/21/2018 [place] 01/02/2019 2027   BILIRUBINUR NEGATIVE 01/02/2019 2027   KETONESUR NEGATIVE 01/02/2019 2027   PROTEINUR NEGATIVE 01/02/2019 2027  UROBILINOGEN 1.0 07/21/2018 1114   NITRITE NEGATIVE 01/02/2019 2027   LEUKOCYTESUR NEGATIVE 01/02/2019 2027      Component Value Date/Time   PHART 7.425 12/06/2018 1413   PCO2ART 40.5 12/06/2018 1413   PO2ART 82.0 (L) 12/06/2018 1413   HCO3 30.0 (H) 02/16/2019 1022   TCO2 31 02/16/2019 1022   ACIDBASEDEF 1.0 11/17/2014 0315   O2SAT 68.7 02/24/2019 0510       Component Value Date/Time   IRON 72 06/17/2009 1025   IRONPCTSAT 21.5 06/17/2009 1025       ASSESSMENT/PLAN:    63 year old black male with a significant cardiomyopathy and fairly advanced CKD now in trouble with volume overload 1.  Chronic kidney disease stage IV.  His baseline serum creatinine runs in the high twos.   2.  Acute kidney injury.  Suspect cardiorenal syndrome in the setting of significant cardiomyopathy and fluid overload.  Not diuresing enough with diuretics.  Starting dialysis today.  We will plan a second treatment tomorrow.  Needs see [facility] for outpatient dialysis.  3.  Hypertension.  Continues to be volume  overloaded.  Should improve with dialysis.  4.  Hypokalemia.  Should improve with dialysis.  5.  Metabolic bone disease.  Phosphorus stable.   [place], DO, [place]

## 2019-02-24 NOTE — Consult Note (Signed)
Chief Complaint: Patient was seen in consultation today for AKI/tunneled HD catheter placement.  Referring Physician(s): Corliss Parish  Supervising Physician: Sandi Mariscal  Patient Status: [facility] - In-pt  History of Present Illness: [name] is a 63 y.o. male with a past medical history of hypertension, dyslipidemia, systolic HF, paroxysmal atrial fibrillation on chronic anticoagulation with Coumadin, paroxysmal VT, nonischemic cardiomyopathy, CAD, s/p ICD, CVA 2000, CKD, diabetes mellitus, OSA, and gout. He presented to [facility] ED 02/13/2019 with complaint of progressive bilateral lower extremity swelling. In ED, BNP elevated to 2637 (up from baseline 1600-2100), CXR showed cardiomegaly and pulmonary venous congestion, and patient noted to be in volume overload on physical exam. HF clinic was consulted and patient was admitted for further management. During admission, patient noted to have creatinine elevated in the high twos, so nephrology was consulted. He was followed by nephrology who started with conservative management (medication adjustments), however renal function continued to decline, and it was recommended that patient be started on dialysis.  IR consulted by Dr. Moshe Cipro for possible image-guided tunneled HD catheter placement. Patient awake and alert laying in bed. Complains of dyspnea on exertion, stable since admission. Denies fever, chills, chest pain, abdominal pain, or headache.  Last dose Coumadin 02/21/2019.   Past Medical History:  Diagnosis Date   CAD (coronary artery disease)    a. Initial nonobst by cath 2009. b. Cath 01/2013 in setting of VT storm: obstructive distal Cx disease (small and terminates in the AV groove, unlikely to cause significant ischemia or electrical instability), nonobstructive RCA disease, EF 15-20%.    Cerebrovascular accident [facility])    a. Basilar CVA 2000. denies deficits   Chronic systolic CHF (congestive heart failure) ([place])     a. Likely NICM (out of proportion to CAD). b. 2009 - EF 25-30% by echo, 01/2013: 15-20% by cath. c. 11/2014 Echo: EF 35-40%, Gr1 DD, mild MR, mod TR, PASP 41mmHg.   CKD (chronic kidney disease), stage II    Dyslipidemia    Gout    HTN (hypertension)    Hypokalemia    ICD (implantable cardiac defibrillator) in place    Insulin dependent diabetes mellitus    Lipoma    Nonischemic cardiomyopathy ([place])    a.  11/2014 Echo: EF 35-40%, Gr1 DD, mild MR, mod TR, PASP 20mmHg.   OSA (obstructive sleep apnea)    does not wear cpap   PAF (paroxysmal atrial fibrillation) ([place])    a. Noted 05/2008 by EKG;  b. CHA2DS2VASc = 5-6-->coumadin.   Paroxysmal VT ([place])    a. s/p St. Jude ICD 2007. b. H/o paroxysmal VT/VF including VT storm 12/2012 admission prompting amio initiation;  c. 01/2013 ICD upgrade SJM 1411-36Q Ellipse VR single lead ICD.   Pulmonary HTN ([place])    a. Mild by cath 01/2013.    Past Surgical History:  Procedure Laterality Date   CARDIAC CATHETERIZATION     Nonobstructive coronary disease 2009   CARDIAC DEFIBRILLATOR PLACEMENT     ICD-St. Jude   CARDIOVERSION N/A 01/03/2019   Procedure: CARDIOVERSION;  Surgeon: Jolaine Artist, MD;  Location: [facility] ENDOSCOPY;  Service: Cardiovascular;  Laterality: N/A;   CENTRAL LINE INSERTION  02/16/2019   Procedure: CENTRAL LINE INSERTION;  Surgeon: Jolaine Artist, MD;  Location: [place] CV LAB;  Service: Cardiovascular;;   IMPLANTABLE CARDIOVERTER DEFIBRILLATOR (ICD) GENERATOR CHANGE N/A 01/15/2014   Procedure: ICD GENERATOR CHANGE;  Surgeon: Evans Lance, MD;  Location: [facility] CATH LAB;  Service: Cardiovascular;  Laterality:  N/A;   LEFT AND RIGHT HEART CATHETERIZATION WITH CORONARY ANGIOGRAM N/A 01/03/2013   Procedure: LEFT AND RIGHT HEART CATHETERIZATION WITH CORONARY ANGIOGRAM;  Surgeon: Peter M Martinique, MD;  Location: [facility] CATH LAB;  Service: Cardiovascular;  Laterality: N/A;   LIPOMA EXCISION     RIGHT HEART CATH N/A  02/16/2019   Procedure: RIGHT HEART CATH;  Surgeon: Jolaine Artist, MD;  Location: [place] CV LAB;  Service: Cardiovascular;  Laterality: N/A;   RIGHT/LEFT HEART CATH AND CORONARY ANGIOGRAPHY N/A 10/04/2017   Procedure: RIGHT/LEFT HEART CATH AND CORONARY ANGIOGRAPHY;  Surgeon: Larey Dresser, MD;  Location: [place] CV LAB;  Service: Cardiovascular;  Laterality: N/A;   RIGHT/LEFT HEART CATH AND CORONARY ANGIOGRAPHY N/A 12/06/2018   Procedure: RIGHT/LEFT HEART CATH AND CORONARY ANGIOGRAPHY;  Surgeon: Jolaine Artist, MD;  Location: [place] CV LAB;  Service: Cardiovascular;  Laterality: N/A;    Allergies: Patient has no known allergies.  Medications: Prior to Admission medications   Medication Sig Start Date End Date Taking? Authorizing Provider  amiodarone (PACERONE) 200 MG tablet Take 1 tablet (200 mg total) by mouth 2 (two) times daily. Patient taking differently: Take 200 mg by mouth daily.  01/05/19  Yes Swayze, Ava, DO  atorvastatin (LIPITOR) 80 MG tablet Take 80 mg by mouth daily at 6 PM.   Yes [provider]  carvedilol (COREG) 3.125 MG tablet Take 1 tablet (3.125 mg total) by mouth 2 (two) times daily with a meal. 02/03/19  Yes Biagio Borg, MD  Insulin Glargine (LANTUS SOLOSTAR) 100 UNIT/ML Solostar Pen Inject 10 units into the skin at bedtime   Yes [provider]  Insulin Pen Needle (B-D ULTRAFINE III SHORT PEN) 31G X 8 MM MISC USE ONCE DAILY WITH INSULIN 01/14/18  Yes Biagio Borg, MD  Insulin Pen Needle (PEN NEEDLES) 31G X 5 MM MISC 100 each by Does not apply route 2 (two) times daily. 11/12/17  Yes Shambley, Delphia Grates, NP  isosorbide-hydrALAZINE (BIDIL) 20-37.5 MG tablet Take 0.5 tablets by mouth 3 (three) times daily. 01/12/19  Yes Bensimhon, Shaune Pascal, MD  nitroGLYCERIN (NITROSTAT) 0.4 MG SL tablet Place 0.4 mg under the tongue every 5 (five) minutes as needed for chest pain.   Yes [provider]  OXYGEN Inhale into the lungs. 2L  continuous; 3L on pulse machine   Yes [provider]  torsemide (DEMADEX) 20 MG tablet Take 2 tablets (40 mg total) by mouth daily. 01/06/19  Yes Swayze, Ava, DO  Turmeric 500 MG CAPS Take 1,000 mg by mouth daily.   Yes [provider]  warfarin (COUMADIN) 5 MG tablet Take 2.5-5 mg by mouth See admin instructions. 5mg  everyday except Friday; 2.5mg  on fridays   Yes [provider]     Family History  Problem Relation Age of Onset   Coronary artery disease Mother    Heart attack Mother    Heart attack Maternal Uncle    Heart attack Maternal Grandmother     Social History   Socioeconomic History   Marital status: Married    Spouse name: Not on file   Number of children: 2   Years of education: Not on file   Highest education level: Not on file  Occupational History   Occupation: medically retired due to heart failure  Social Needs   Financial resource strain: Not on file   Food insecurity    Worry: Not on file    Inability: Not on file  Transportation needs    Medical: Yes    Non-medical: No  Tobacco Use   Smoking status: Former Smoker    Quit date: 05/04/1986    Years since quitting: 32.8   Smokeless tobacco: Never Used   Tobacco comment: Quit smoking 30 yrs ago. Smoked as teenager less than 1/2 ppd. Smoked x 4 years.  Substance and Sexual Activity   Alcohol use: No    Comment: occasionally   Drug use: No   Sexual activity: Never  Lifestyle   Physical activity    Days per week: 0 days    Minutes per session: 0 min   Stress: Not at all  Relationships   Social connections    Talks on phone: Twice a week    Gets together: Never    Attends religious service: Never    Active member of club or organization: No    Attends meetings of clubs or organizations: Never    Relationship status: Married  Other Topics Concern   Not on file  Social History Narrative   Not on file     Review of Systems: A 12 point ROS discussed  and pertinent positives are indicated in the HPI above.  All other systems are negative.  Review of Systems  Constitutional: Negative for chills and fever.  Respiratory: Positive for shortness of breath. Negative for wheezing.   Cardiovascular: Negative for chest pain and palpitations.  Gastrointestinal: Negative for abdominal pain.  Neurological: Negative for headaches.  Psychiatric/Behavioral: Negative for behavioral problems and confusion.    Vital Signs: BP 105/66 (BP Location: Left Arm)    Pulse 68    Temp 97.9 F (36.6 C) (Oral)    Resp 20    Ht 5\' 10"  (1.778 m)    Wt 254 lb 9.6 oz (115.5 kg)    SpO2 98%    BMI 36.53 kg/m   Physical Exam Vitals signs and nursing note reviewed.  Constitutional:      General: He is not in acute distress.    Appearance: Normal appearance.  Cardiovascular:     Rate and Rhythm: Normal rate and regular rhythm.     Heart sounds: Normal heart sounds. No murmur.  Pulmonary:     Effort: Pulmonary effort is normal. No respiratory distress.     Breath sounds: Normal breath sounds. No wheezing.  Musculoskeletal:     Right lower leg: Edema present.     Left lower leg: Edema present.  Skin:    General: Skin is warm and dry.  Neurological:     Mental Status: He is alert and oriented to person, place, and time.      MD Evaluation Airway: WNL Heart: WNL Heart  comments: EF 25% Abdomen: WNL Chest/ Lungs: WNL ASA  Classification: 3 Mallampati/Airway Score: Two   Imaging: Dg Chest 2 View  Result Date: 02/13/2019 CLINICAL DATA:  Shortness of breath EXAM: CHEST - 2 VIEW COMPARISON:  January 02, 2019 FINDINGS: Cardiomegaly and pulmonary venous congestion are identified. A single lead AICD device is stable. No pneumothorax. No focal infiltrate. No other acute abnormalities. IMPRESSION: Cardiomegaly and pulmonary venous congestion. Electronically Signed   By: Dorise Bullion III M.D   On: 02/13/2019 11:49    Labs:  CBC: Recent Labs     02/21/19 0525 02/22/19 0500 02/23/19 0545 02/24/19 0526  WBC 6.7 7.5 7.4 6.9  HGB 12.3* 12.3* 12.7* 12.5*  HCT 38.5* 39.0 39.4 37.6*  PLT 176 209 221 212    COAGS: Recent Labs  02/21/19 0525 02/22/19 0500 02/23/19 0545 02/24/19 0526  INR 2.3* 2.2* 2.0* 1.9*    BMP: Recent Labs    02/21/19 0525 02/22/19 0500 02/23/19 0545 02/24/19 0526  NA 139 141 139 140  K 3.2* 3.3* 3.1* 3.3*  CL 98 97* 94* 95*  CO2 30 30 30  32  GLUCOSE 107* 108* 107* 97  BUN 66* 69* 73* 75*  CALCIUM 9.0 9.3 9.4 9.5  CREATININE 3.84* 4.17* 4.34* 4.53*  GFRNONAA 16* 14* 14* 13*  GFRAA 18* 17* 16* 15*    LIVER FUNCTION TESTS: Recent Labs    11/01/18 1229 01/04/19 0429 02/13/19 1556  BILITOT 2.4*  --  2.8*  AST 45*  --  225*  ALT 42  --  133*  ALKPHOS 146*  --  217*  PROT 7.1  --  6.8  ALBUMIN 3.9 3.2* 3.0*      Assessment and Plan:  AKI. Plan for image-guided tunneled HD catheter placement tentatively for today in IR. Patient is NPO. Afebrile and WBCs WNL. Last dose Coumadin 02/21/2019- ok to proceed per Dr. Pascal Lux. INR 1.9 today- ok to proceed per Dr. Pascal Lux.  Risks and benefits discussed with the patient including, but not limited to bleeding, infection, vascular injury, pneumothorax which may require chest tube placement, air embolism or even death. All of the patient's questions were answered, patient is agreeable to proceed. Consent signed and in chart.   Thank you for this interesting consult.  I greatly enjoyed meeting [name] and look forward to participating in their care.  A copy of this report was sent to the requesting provider on this date.  Electronically Signed: Earley Abide, PA-C 02/24/2019, 10:32 AM   I spent a total of 40 Minutes in face to face in clinical consultation, greater than 50% of which was counseling/coordinating care for AKI/tunneled HD catheter placement.

## 2019-02-25 DIAGNOSIS — I5043 Acute on chronic combined systolic (congestive) and diastolic (congestive) heart failure: Secondary | ICD-10-CM | POA: Diagnosis not present

## 2019-02-25 LAB — BASIC METABOLIC PANEL
Anion gap: 12 (ref 5–15)
BUN: 50 mg/dL — ABNORMAL HIGH (ref 8–23)
CO2: 30 mmol/L (ref 22–32)
Calcium: 9 mg/dL (ref 8.9–10.3)
Chloride: 100 mmol/L (ref 98–111)
Creatinine, Ser: 3.74 mg/dL — ABNORMAL HIGH (ref 0.61–1.24)
GFR calc Af Amer: 19 mL/min — ABNORMAL LOW (ref >60)
GFR calc non Af Amer: 16 mL/min — ABNORMAL LOW (ref >60)
Glucose, Bld: 131 mg/dL — ABNORMAL HIGH (ref 70–99)
Potassium: 4 mmol/L (ref 3.5–5.1)
Sodium: 142 mmol/L (ref 135–145)

## 2019-02-25 LAB — PROTIME-INR
INR: 1.6 — ABNORMAL HIGH (ref 0.8–1.2)
Prothrombin Time: 19.1 seconds — ABNORMAL HIGH (ref 11.4–15.2)

## 2019-02-25 LAB — GLUCOSE, CAPILLARY
Glucose-Capillary: 109 mg/dL — ABNORMAL HIGH (ref 70–99)
Glucose-Capillary: 161 mg/dL — ABNORMAL HIGH (ref 70–99)
Glucose-Capillary: 95 mg/dL (ref 70–99)
Glucose-Capillary: 170 mg/dL — ABNORMAL HIGH (ref 70–99)

## 2019-02-25 LAB — CBC
HCT: 40.6 % (ref 39.0–52.0)
Hemoglobin: 13 g/dL (ref 13.0–17.0)
MCH: 31.3 pg (ref 26.0–34.0)
MCHC: 32 g/dL (ref 30.0–36.0)
MCV: 97.6 fL (ref 80.0–100.0)
Platelets: 196 10*3/uL (ref 150–400)
RBC: 4.16 MIL/uL — ABNORMAL LOW (ref 4.22–5.81)
RDW: 16.5 % — ABNORMAL HIGH (ref 11.5–15.5)
WBC: 7.8 10*3/uL (ref 4.0–10.5)
nRBC: 0 % (ref 0.0–0.2)

## 2019-02-25 LAB — MAGNESIUM: Magnesium: 2.2 mg/dL (ref 1.7–2.4)

## 2019-02-25 LAB — COOXEMETRY PANEL
Carboxyhemoglobin: 1.9 % — ABNORMAL HIGH (ref 0.5–1.5)
Methemoglobin: 1.2 % (ref 0.0–1.5)
O2 Saturation: 68.5 %
Total hemoglobin: 13.4 g/dL (ref 12.0–16.0)

## 2019-02-25 LAB — HEPARIN LEVEL (UNFRACTIONATED): Heparin Unfractionated: 1.01 IU/mL — ABNORMAL HIGH (ref 0.30–0.70)

## 2019-02-25 MED ORDER — HEPARIN (PORCINE) 25000 UT/250ML-% IV SOLN
1100.0000 [IU]/h | INTRAVENOUS | Status: DC
  Administered 2019-02-25: 10:00:00 1400 [IU]/h via INTRAVENOUS
  Administered 2019-02-26: 02:00:00 1100 [IU]/h via INTRAVENOUS
  Filled 2019-02-25 (×2): qty 250

## 2019-02-25 MED ORDER — WARFARIN SODIUM 7.5 MG PO TABS
7.5000 mg | ORAL_TABLET | Freq: Once | ORAL | Status: AC
  Administered 2019-02-25: 7.5 mg via ORAL
  Filled 2019-02-25: qty 1

## 2019-02-25 MED ORDER — HEPARIN SODIUM (PORCINE) 1000 UNIT/ML IJ SOLN
INTRAMUSCULAR | Status: AC
  Filled 2019-02-25: qty 1

## 2019-02-25 MED ORDER — HEPARIN BOLUS VIA INFUSION
4000.0000 [IU] | Freq: Once | INTRAVENOUS | Status: AC
  Administered 2019-02-25: 10:00:00 4000 [IU] via INTRAVENOUS
  Filled 2019-02-25: qty 4000

## 2019-02-25 NOTE — Progress Notes (Addendum)
ANTICOAGULATION CONSULT NOTE - Follow Up Consult  Pharmacy Consult for Warfarin and heparin  Indication: atrial fibrillation  No Known Allergies  Patient Measurements: Height: 5\' 10"  (177.8 cm) Weight: 244 lb 11.4 oz (111 kg) IBW/kg (Calculated) : 73  Heparin dosing weight: 101 kg  Vital Signs: Temp: 98.5 F (36.9 C) (10/24 1729) Temp Source: Oral (10/24 1729) BP: 110/84 (10/24 1738) Pulse Rate: 72 (10/24 1738)  Labs: Recent Labs    02/23/19 0545 02/24/19 0526 02/25/19 0452 02/25/19 1649  HGB 12.7* 12.5* 13.0  --   HCT 39.4 37.6* 40.6  --   PLT 221 212 196  --   LABPROT 22.3* 21.9* 19.1*  --   INR 2.0* 1.9* 1.6*  --   HEPARINUNFRC  --   --   --  1.01*  CREATININE 4.34* 4.53* 3.74*  --     Estimated Creatinine Clearance: 25.5 mL/min (A) (by C-G formula based on SCr of 3.74 mg/dL (H)).   Assessment: Pt is a 63 y/o male with PMH of CHF (EF 20-25%), prior VT s/p St Jude ICD, CAD, CVA, OSA on CPAP, pulmonary HTN, HTN, DM, CKDs stage III, and PAF on coumadin PTA (5mg  daily, except 2.5 mg on fridays). Pharmacy has been consulted to dose warfarin s/p HD catheter placed 10/23. -INR= 1.6  Heparin started today -initial heparin level= 1.01   Goal of Therapy:  HL 0.3-0.7 INR 2-3 Monitor platelets by anticoagulation protocol: Yes   Plan:  -decrease heparin to 1100 units/hr -Heparin level in 8 hours and daily wth CBC daily   Thank you,   Hildred Laser, PharmD Clinical Pharmacist **Pharmacist phone directory can now be found on [email or web address] ([email or web address]).  Listed under [place].

## 2019-02-25 NOTE — Progress Notes (Addendum)
ANTICOAGULATION CONSULT NOTE - Follow Up Consult  Pharmacy Consult for Warfarin and heparin  Indication: atrial fibrillation  No Known Allergies  Patient Measurements: Height: 5\' 10"  (177.8 cm) Weight: 245 lb 13 oz (111.5 kg) IBW/kg (Calculated) : 73  Heparin dosing weight: 101 kg  Vital Signs: Temp: 98.9 F (37.2 C) (10/24 0530) Temp Source: Oral (10/24 0530) BP: 116/88 (10/24 0530) Pulse Rate: 77 (10/24 0530)  Labs: Recent Labs    02/23/19 0545 02/24/19 0526 02/25/19 0452  HGB 12.7* 12.5* 13.0  HCT 39.4 37.6* 40.6  PLT 221 212 196  LABPROT 22.3* 21.9* 19.1*  INR 2.0* 1.9* 1.6*  CREATININE 4.34* 4.53* 3.74*    Estimated Creatinine Clearance: 25.6 mL/min (A) (by C-G formula based on SCr of 3.74 mg/dL (H)).   Medical History: Past Medical History:  Diagnosis Date  . CAD (coronary artery disease)    a. Initial nonobst by cath 2009. b. Cath 01/2013 in setting of VT storm: obstructive distal Cx disease (small and terminates in the AV groove, unlikely to cause significant ischemia or electrical instability), nonobstructive RCA disease, EF 15-20%.   . Cerebrovascular accident [facility])    a. 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 Chronic systolic CHF (congestive heart failure) ([place])    a. Likely NICM (out of proportion to CAD). b. 2009 - EF 25-30% by echo, 01/2013: 15-20% by cath. c. 11/2014 Echo: EF 35-40%, Gr1 DD, mild MR, mod TR, PASP 55mmHg.  . CKD (chronic kidney disease), stage II   . Dyslipidemia   . Gout   . HTN (hypertension)   . Hypokalemia   . ICD (implantable cardiac defibrillator) in place   . Insulin dependent diabetes mellitus   . Lipoma   . Nonischemic cardiomyopathy ([place])    a.  11/2014 Echo: EF 35-40%, Gr1 DD, mild MR, mod TR, PASP 4mmHg.  . OSA (obstructive sleep apnea)    does not wear cpap  . PAF (paroxysmal atrial fibrillation) ([place])    a. Noted 05/2008 by EKG;  b. CHA2DS2VASc = 5-6-->coumadin.  . Paroxysmal VT ([place])    a. s/p St. Jude ICD 2007. b.  H/o paroxysmal VT/VF including VT storm 12/2012 admission prompting amio initiation;  c. 01/2013 ICD upgrade SJM 1411-36Q Ellipse VR single lead ICD.  [name] Pulmonary HTN ([place])    a. Mild by cath 01/2013.    Medications:  Scheduled:  . amiodarone  200 mg Oral BID  . atorvastatin  80 mg Oral q1800  . Chlorhexidine Gluconate Cloth  6 each Topical Daily  . metolazone  10 mg Oral Daily  . midodrine  10 mg Oral TID WC  . potassium chloride  40 mEq Oral TID  . sildenafil  20 mg Oral BID  . sodium chloride flush  10 mL Intravenous Q12H  . torsemide  100 mg Oral BID  . Warfarin - Pharmacist Dosing Inpatient   Does not apply q1800   Infusions:  . sodium chloride      Assessment: Pt is a 63 y/o male with PMH of CHF (EF 20-25%), prior VT s/p St Jude ICD, CAD, CVA, OSA on CPAP, pulmonary HTN, HTN, DM, CKDs stage III, and PAF on coumadin PTA (5mg  daily, except 2.5 mg on fridays). Pharmacy has been consulted to dose warfarin s/p HD catheter placed 10/23.  INR today sub-therapeutic at 1.6 after holding warfarin x 2 days, now has received 1x 5 mg last night. H&H is stable at 13.0/40.6 Creatinine has decreased d/t HD on 10/23. Of note,  amiodarone may increase the anticoagulant effect of warfarin.   Per Dr. Clayborne Dana note 10/23, okay to start heparin as needed for today. Paged on call MD to confirm order okay to put in and will start heparin infusion to bridge until INR therapeutic.    Goal of Therapy:  HL 0.3-0.7 INR 2-3 Monitor platelets by anticoagulation protocol: Yes   Plan:  -Bolus heparin with 4000 units -Start heparin infusion at 1400 units/hour -6 hour heparin level -Warfarin 7.5mg  PO x1 tonight -Daily INR and CBC and HL -Monitor signs of bleeding     Thank you,   Eddie Candle, PharmD PGY-1 Pharmacy Resident   Please check amion for clinical pharmacist contact number

## 2019-02-25 NOTE — Progress Notes (Signed)
[facility]    NEPHROLOGY PROGRESS NOTE  SUBJECTIVE: Patient seen and examined. Denies chest pain or shortness of breath.  Continues to have lower extremity edema complains of generalized weakness.  Denies fevers, chills, nausea, vomiting, diarrhea or dysuria.  All other review of systems are negative.  Status post first dialysis treatment yesterday.  OBJECTIVE:  Vitals:   02/24/19 2323 02/25/19 0530  BP: 109/69 116/88  Pulse: 78 77  Resp: 20 18  Temp: 98 F (36.7 C) 98.9 F (37.2 C)  SpO2: 98% 99%    Intake/Output Summary (Last 24 hours) at 02/25/2019 1000 Last data filed at 02/25/2019 0806 Gross per 24 hour  Intake 940 ml  Output 3275 ml  Net -2335 ml      General:  AAOx3 NAD HEENT: MMM [place] AT anicteric sclera Neck:  No JVD, no adenopathy CV:  Heart RRR  Lungs:  L/S CTA bilaterally Abd:  abd SNT/ND with normal BS GU:  Bladder non-palpable Extremities: +3 bilateral lower extremity edema Skin:  No skin rash  MEDICATIONS:  . amiodarone  200 mg Oral BID  . atorvastatin  80 mg Oral q1800  . Chlorhexidine Gluconate Cloth  6 each Topical Daily  . heparin  4,000 Units Intravenous Once  . metolazone  10 mg Oral Daily  . midodrine  10 mg Oral TID WC  . potassium chloride  40 mEq Oral TID  . sildenafil  20 mg Oral BID  . sodium chloride flush  10 mL Intravenous Q12H  . torsemide  100 mg Oral BID  . warfarin  7.5 mg Oral ONCE-1800  . Warfarin - Pharmacist Dosing Inpatient   Does not apply q1800       LABS:   CBC Latest Ref Rng & Units 02/25/2019 02/24/2019 02/23/2019  WBC 4.0 - 10.5 K/uL 7.8 6.9 7.4  Hemoglobin 13.0 - 17.0 g/dL 13.0 12.5(L) 12.7(L)  Hematocrit 39.0 - 52.0 % 40.6 37.6(L) 39.4  Platelets 150 - 400 K/uL 196 212 221    CMP Latest Ref Rng & Units 02/25/2019 02/24/2019 02/23/2019  Glucose 70 - 99 mg/dL 131(H) 97 107(H)  BUN 8 - 23 mg/dL 50(H) 75(H) 73(H)  Creatinine 0.61 - 1.24 mg/dL 3.74(H) 4.53(H) 4.34(H)  Sodium 135 - 145 mmol/L  142 140 139  Potassium 3.5 - 5.1 mmol/L 4.0 3.3(L) 3.1(L)  Chloride 98 - 111 mmol/L 100 95(L) 94(L)  CO2 22 - 32 mmol/L 30 32 30  Calcium 8.9 - 10.3 mg/dL 9.0 9.5 9.4  Total Protein 6.5 - 8.1 g/dL - - -  Total Bilirubin 0.3 - 1.2 mg/dL - - -  Alkaline Phos 38 - 126 U/L - - -  AST 15 - 41 U/L - - -  ALT 0 - 44 U/L - - -    Lab Results  Component Value Date   CALCIUM 9.0 02/25/2019   CAION 1.12 (L) 02/16/2019   PHOS 4.9 (H) 01/04/2019       Component Value Date/Time   COLORURINE YELLOW 01/02/2019 2027   APPEARANCEUR CLEAR 01/02/2019 2027   LABSPEC 1.010 01/02/2019 2027   PHURINE 5.0 01/02/2019 2027   GLUCOSEU NEGATIVE 01/02/2019 2027   GLUCOSEU NEGATIVE 07/21/2018 1114   HGBUR NEGATIVE 01/02/2019 2027   BILIRUBINUR NEGATIVE 01/02/2019 2027   KETONESUR NEGATIVE 01/02/2019 2027   PROTEINUR NEGATIVE 01/02/2019 2027   UROBILINOGEN 1.0 07/21/2018 1114   NITRITE NEGATIVE 01/02/2019 2027   LEUKOCYTESUR NEGATIVE 01/02/2019 2027      Component Value Date/Time   PHART 7.425  12/06/2018 1413   PCO2ART 40.5 12/06/2018 1413   PO2ART 82.0 (L) 12/06/2018 1413   HCO3 30.0 (H) 02/16/2019 1022   TCO2 31 02/16/2019 1022   ACIDBASEDEF 1.0 11/17/2014 0315   O2SAT 68.5 02/25/2019 0440       Component Value Date/Time   IRON 72 06/17/2009 1025   IRONPCTSAT 21.5 06/17/2009 1025       ASSESSMENT/PLAN:    63 year old black male with a significant cardiomyopathy and fairly advanced CKD now in trouble with volume overload 1.  Chronic kidney disease stage IV.  His baseline serum creatinine runs in the high twos.  Likely now end-stage.  2.  Acute kidney injury.  Suspect cardiorenal syndrome in the setting of significant cardiomyopathy and fluid overload.  Second dialysis treatment today.  We will continue to monitor for dialysis need.  3.  Hypertension.  Continues to be volume overloaded.  Should improve with dialysis.  4.  Hypokalemia.  Improved with dialysis.  5.  Metabolic bone disease.   Phosphorus stable.   [place], DO, [place]

## 2019-02-25 NOTE — Progress Notes (Signed)
Patient ID: [name], male   DOB: [birth year], 63 y.o.   MRN: [id number]     Advanced Heart Failure Rounding Note  PCP-Cardiologist: Minus Breeding, MD  [facility]: Dr. Haroldine Laws   Subjective:    Patient seen while on HD. Had successful 1st HD last night and tolerating well again today.  No CP or SOB. Weight down 4 pounds overnight. 35 pounds total  Objective:   Weight Range: 111.5 kg Body mass index is 35.27 kg/m.   Vital Signs:   Temp:  [97.6 F (36.4 C)-98.9 F (37.2 C)] 98.1 F (36.7 C) (10/24 1317) Pulse Rate:  [57-78] 75 (10/24 1317) Resp:  [12-20] 18 (10/24 0530) BP: (86-126)/(7-99) 86/67 (10/24 1317) SpO2:  [93 %-99 %] 99 % (10/24 0530) Weight:  [111.5 kg-115.3 kg] 111.5 kg (10/24 0620) Last BM Date: 02/24/19  Weight change: Filed Weights   02/24/19 1955 02/24/19 2234 02/25/19 0620  Weight: 115.3 kg 113.2 kg 111.5 kg    Intake/Output:   Intake/Output Summary (Last 24 hours) at 02/25/2019 1427 Last data filed at 02/25/2019 0806 Gross per 24 hour  Intake 940 ml  Output 2525 ml  Net -1585 ml      Physical Exam   General:  Lying in bed on HD No resp difficulty HEENT: normal Neck: supple. RIJ HD cath  Carotids 2+ bilat; no bruits. No lymphadenopathy or thryomegaly appreciated. Cor: PMI nondisplaced. Regular rate & rhythm. No rubs, gallops or murmurs. Lungs: clear Abdomen: obese soft, nontender, nondistended. No hepatosplenomegaly. No bruits or masses. Good bowel sounds. Extremities: no cyanosis, clubbing, rash, 2+ edema ober UNNA boots Neuro: alert & orientedx3, cranial nerves grossly intact. moves all 4 extremities w/o difficulty. Affect pleasant  Telemetry   SR 70s Personally reviewed   EKG    N/A  Labs    CBC Recent Labs    02/24/19 0526 02/25/19 0452  WBC 6.9 7.8  HGB 12.5* 13.0  HCT 37.6* 40.6  MCV 96.2 97.6  PLT 212 742   Basic Metabolic Panel Recent Labs    02/24/19 0526 02/25/19 0452  NA 140 142  K 3.3* 4.0  CL 95* 100    CO2 32 30  GLUCOSE 97 131*  BUN 75* 50*  CREATININE 4.53* 3.74*  CALCIUM 9.5 9.0  MG 2.3 2.2   Liver Function Tests No results for input(s): AST, ALT, ALKPHOS, BILITOT, PROT, ALBUMIN in the last 72 hours. No results for input(s): LIPASE, AMYLASE in the last 72 hours. Cardiac Enzymes No results for input(s): CKTOTAL, CKMB, CKMBINDEX, TROPONINI in the last 72 hours.  BNP: BNP (last 3 results) Recent Labs    07/19/18 1106 01/02/19 1552 02/13/19 1114  BNP 1,608.5* 2,154.7* 2,637.3*    ProBNP (last 3 results) Recent Labs    11/01/18 1229  PROBNP 2,158.0*     D-Dimer No results for input(s): DDIMER in the last 72 hours. Hemoglobin A1C No results for input(s): HGBA1C in the last 72 hours. Fasting Lipid Panel No results for input(s): CHOL, HDL, LDLCALC, TRIG, CHOLHDL, LDLDIRECT in the last 72 hours. Thyroid Function Tests No results for input(s): TSH, T4TOTAL, T3FREE, THYROIDAB in the last 72 hours.  Invalid input(s): FREET3  Other results:   Imaging    Ir Fluoro Guide Cv Line Right  Result Date: 02/24/2019 INDICATION: End-stage renal disease. In need of durable intravenous access for the initiation of hemodialysis Patient currently has a non tunneled right jugular approach central venous catheter however given the presence of the left anterior chest wall  AICD/pacemaker the decision was made to proceed with right internal jugular approach dialysis catheter placement with access caudal to existing non tunneled central venous catheter. EXAM: TUNNELED CENTRAL VENOUS HEMODIALYSIS CATHETER PLACEMENT WITH ULTRASOUND AND FLUOROSCOPIC GUIDANCE MEDICATIONS: Ancef 2 gm IV . The antibiotic was given in an appropriate time interval prior to skin puncture. ANESTHESIA/SEDATION: Versed 1 mg IV; Fentanyl 25 mcg IV; Moderate Sedation Time:  14 The patient was continuously monitored during the procedure by the interventional radiology nurse under my direct supervision. FLUOROSCOPY TIME:  1  minute (19 mGy) COMPLICATIONS: None immediate. PROCEDURE: Informed written consent was obtained from the patient after a discussion of the risks, benefits, and alternatives to treatment. Questions regarding the procedure were encouraged and answered. The right neck and chest were prepped with chlorhexidine in a sterile fashion, and a sterile drape was applied covering the operative field. Maximum barrier sterile technique with sterile gowns and gloves were used for the procedure. A timeout was performed prior to the initiation of the procedure. After creating a small venotomy incision, a micropuncture kit was utilized to access the internal jugular vein. Real-time ultrasound guidance was utilized for vascular access including the acquisition of a permanent ultrasound image documenting patency of the accessed vessel. The microwire was utilized to measure appropriate catheter length. A stiff Glidewire was advanced to the level of the IVC and the micropuncture sheath was exchanged for a peel-away sheath. A palindrome tunneled hemodialysis catheter measuring 19 cm from tip to cuff was tunneled in a retrograde fashion from the anterior chest wall to the venotomy incision. The catheter was then placed through the peel-away sheath with tips ultimately positioned within the superior aspect of the right atrium. Final catheter positioning was confirmed and documented with a spot radiographic image. The catheter aspirates and flushes normally. The catheter was flushed with appropriate volume heparin dwells. The catheter exit site was secured with a 0-Prolene retention suture. The venotomy incision was closed with Dermabond and Steri-strips. Dressings were applied. The patient tolerated the procedure well without immediate post procedural complication. IMPRESSION: Successful placement of 19 cm tip to cuff tunneled hemodialysis catheter via the right internal jugular vein with tips terminating within the superior aspect of the  right atrium. The catheter is ready for immediate use. Electronically Signed   By: Sandi Mariscal M.D.   On: 02/24/2019 16:33   Ir US Guide Vasc Access Right  Result Date: 02/24/2019 INDICATION: End-stage renal disease. In need of durable intravenous access for the initiation of hemodialysis Patient currently has a non tunneled right jugular approach central venous catheter however given the presence of the left anterior chest wall AICD/pacemaker the decision was made to proceed with right internal jugular approach dialysis catheter placement with access caudal to existing non tunneled central venous catheter. EXAM: TUNNELED CENTRAL VENOUS HEMODIALYSIS CATHETER PLACEMENT WITH ULTRASOUND AND FLUOROSCOPIC GUIDANCE MEDICATIONS: Ancef 2 gm IV . The antibiotic was given in an appropriate time interval prior to skin puncture. ANESTHESIA/SEDATION: Versed 1 mg IV; Fentanyl 25 mcg IV; Moderate Sedation Time:  14 The patient was continuously monitored during the procedure by the interventional radiology nurse under my direct supervision. FLUOROSCOPY TIME:  1 minute (19 mGy) COMPLICATIONS: None immediate. PROCEDURE: Informed written consent was obtained from the patient after a discussion of the risks, benefits, and alternatives to treatment. Questions regarding the procedure were encouraged and answered. The right neck and chest were prepped with chlorhexidine in a sterile fashion, and a sterile drape was applied covering the operative field.  Maximum barrier sterile technique with sterile gowns and gloves were used for the procedure. A timeout was performed prior to the initiation of the procedure. After creating a small venotomy incision, a micropuncture kit was utilized to access the internal jugular vein. Real-time ultrasound guidance was utilized for vascular access including the acquisition of a permanent ultrasound image documenting patency of the accessed vessel. The microwire was utilized to measure appropriate  catheter length. A stiff Glidewire was advanced to the level of the IVC and the micropuncture sheath was exchanged for a peel-away sheath. A palindrome tunneled hemodialysis catheter measuring 19 cm from tip to cuff was tunneled in a retrograde fashion from the anterior chest wall to the venotomy incision. The catheter was then placed through the peel-away sheath with tips ultimately positioned within the superior aspect of the right atrium. Final catheter positioning was confirmed and documented with a spot radiographic image. The catheter aspirates and flushes normally. The catheter was flushed with appropriate volume heparin dwells. The catheter exit site was secured with a 0-Prolene retention suture. The venotomy incision was closed with Dermabond and Steri-strips. Dressings were applied. The patient tolerated the procedure well without immediate post procedural complication. IMPRESSION: Successful placement of 19 cm tip to cuff tunneled hemodialysis catheter via the right internal jugular vein with tips terminating within the superior aspect of the right atrium. The catheter is ready for immediate use. Electronically Signed   By: Sandi Mariscal M.D.   On: 02/24/2019 16:33     Medications:     Scheduled Medications:  amiodarone  200 mg Oral BID   atorvastatin  80 mg Oral q1800   Chlorhexidine Gluconate Cloth  6 each Topical Daily   metolazone  10 mg Oral Daily   midodrine  10 mg Oral TID WC   potassium chloride  40 mEq Oral TID   sildenafil  20 mg Oral BID   sodium chloride flush  10 mL Intravenous Q12H   torsemide  100 mg Oral BID   warfarin  7.5 mg Oral ONCE-1800   Warfarin - Pharmacist Dosing Inpatient   Does not apply q1800    Infusions:  sodium chloride     heparin 1,400 Units/hr (02/25/19 1022)    PRN Medications: sodium chloride, acetaminophen, loperamide, ondansetron (ZOFRAN) IV, sodium chloride flush    Patient Profile   63 y/o male with severe systolic HF with  biventricular dysfunction due to NICM and advanced CKD. Recently hospitalized with low output HF. Now returns with progressive NYHA IV symptoms with marked fluid overload not responding to titration of oral diuretic regimen. With CKD 4, biventricular HF and morbid obesity advanced options are quite limited.  Assessment/Plan    1. Acute on Chronic Systolic HF with biventricular dysfunction due to NICM: mild nonobstructive CAD on [facility] 12/2018. CM out of proportion of CAD. PYP scan and myeloma panel completed and were not suggestive of cardiac amyloidosis. S/p St Jude ICD. ECHO 07/28/2017 EF 20-25%. RV mildly dilated. Echo 11/17/18 EF 20-25% RV dilated and severely HK with septal flattening. Presented to ED 10/12 w/ NYHA IV symptoms and markedly volume overload w/ 3+ bilateral LEE, left basilar rales and elevated JVD to level of ear. Admit Wt up 10+ lb from home baseline. BNP 2600 on admit.  -Poor initial response to high dose IV diuretics w/ increase in SCr from  2.76>>3.30, prompting initiation of inotropic support, started on milrinone. Milrinone later stopped due to ventricular ectopy.  - [place] 10/15 with R sided HF and PAH.  -  Now s/p HD catheter placment. Today is HD # 2.  - Volume continues to improve - Co-ox 68% -He continues to deteriorate with biventricular dysfunction/PAH and progressive CKD. Only real option seems to be consideration of heart/kidney transplant but Body mass index is 35.27 kg/m. which is prohibitive.   2. Acute on CKD stage 3 - Now s/p HD catheter placment. Today is HD # 2.  -Nephrology appreciated - Plan HD start today through RIJ [place]  3. CAD - LHC8/20with LAD 20%50% distal left circ and 20% mid RCA stenosis. No significant disease in left main or LAD. - No s/s ischemia Hs Trop 284 but likely demand ischemia in the setting of a/c CHF and abnormal renal function.   - Continue statin. - Hold ? blocker due to soft BP/ concern for low output - No ASA due to chronic  coumadin  4. PAF  -s/p recent DC-CV for AFL.  - Continue amio 200 mg bid, he is in NSR.    - Coumadin held for [facility]. INR 1.6. Now back on heparin. No bleeding. Discussed dosing with PharmD personally.   6. VT -Has St Jude ICD.  -Had VT episode (rate 190 bpm) w/ delivery of shock on 02/07/19  -Started on IV amiodarone 10/14 w/ improvement, NSVT less frequent off milrinone.  - Continue amiodarone 200 mg po twice a day.  -Keep K >4.0 and Mg > 2.0 .  7. Pulmonary HTN - On home O2 (2L).  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 Had negative V/Q scan ruling out CTEPH.  - RHC PVR 6.5. Started on sildenafil 20 mg three times a day.  - Off sildenafil due to low BP and need to preserve renal perfusion  8. Hypokalemia: K 4.0 today     Length of Stay: Boyd, MD  02/25/2019, 2:27 PM  Advanced Heart Failure Team Pager [phone] (M-F; 7a - 4p)  Please contact [facility] for night-coverage after hours (4p -7a ) and weekends on [email or web address]

## 2019-02-26 DIAGNOSIS — I5043 Acute on chronic combined systolic (congestive) and diastolic (congestive) heart failure: Secondary | ICD-10-CM | POA: Diagnosis not present

## 2019-02-26 LAB — MAGNESIUM: Magnesium: 2 mg/dL (ref 1.7–2.4)

## 2019-02-26 LAB — BASIC METABOLIC PANEL
Anion gap: 11 (ref 5–15)
BUN: 33 mg/dL — ABNORMAL HIGH (ref 8–23)
CO2: 28 mmol/L (ref 22–32)
Calcium: 9.4 mg/dL (ref 8.9–10.3)
Chloride: 100 mmol/L (ref 98–111)
Creatinine, Ser: 3.56 mg/dL — ABNORMAL HIGH (ref 0.61–1.24)
GFR calc Af Amer: 20 mL/min — ABNORMAL LOW (ref >60)
GFR calc non Af Amer: 17 mL/min — ABNORMAL LOW (ref >60)
Glucose, Bld: 105 mg/dL — ABNORMAL HIGH (ref 70–99)
Potassium: 4.8 mmol/L (ref 3.5–5.1)
Sodium: 139 mmol/L (ref 135–145)

## 2019-02-26 LAB — CBC
HCT: 42.2 % (ref 39.0–52.0)
Hemoglobin: 13.8 g/dL (ref 13.0–17.0)
MCH: 32 pg (ref 26.0–34.0)
MCHC: 32.7 g/dL (ref 30.0–36.0)
MCV: 97.9 fL (ref 80.0–100.0)
Platelets: 155 10*3/uL (ref 150–400)
RBC: 4.31 MIL/uL (ref 4.22–5.81)
RDW: 16.5 % — ABNORMAL HIGH (ref 11.5–15.5)
WBC: 9.5 10*3/uL (ref 4.0–10.5)
nRBC: 0 % (ref 0.0–0.2)

## 2019-02-26 LAB — GLUCOSE, CAPILLARY
Glucose-Capillary: 94 mg/dL (ref 70–99)
Glucose-Capillary: 108 mg/dL — ABNORMAL HIGH (ref 70–99)
Glucose-Capillary: 95 mg/dL (ref 70–99)
Glucose-Capillary: 139 mg/dL — ABNORMAL HIGH (ref 70–99)

## 2019-02-26 LAB — COOXEMETRY PANEL
Carboxyhemoglobin: 2.1 % — ABNORMAL HIGH (ref 0.5–1.5)
Methemoglobin: 0.9 % (ref 0.0–1.5)
O2 Saturation: 76.5 %
Total hemoglobin: 14 g/dL (ref 12.0–16.0)

## 2019-02-26 LAB — PROTIME-INR
INR: 1.8 — ABNORMAL HIGH (ref 0.8–1.2)
Prothrombin Time: 20.9 seconds — ABNORMAL HIGH (ref 11.4–15.2)

## 2019-02-26 LAB — HEPARIN LEVEL (UNFRACTIONATED)
Heparin Unfractionated: 1.03 IU/mL — ABNORMAL HIGH (ref 0.30–0.70)
Heparin Unfractionated: 1.07 IU/mL — ABNORMAL HIGH (ref 0.30–0.70)
Heparin Unfractionated: 0.69 IU/mL (ref 0.30–0.70)

## 2019-02-26 MED ORDER — WARFARIN SODIUM 5 MG PO TABS
5.0000 mg | ORAL_TABLET | Freq: Once | ORAL | Status: AC
  Administered 2019-02-26: 5 mg via ORAL
  Filled 2019-02-26: qty 1

## 2019-02-26 MED ORDER — HEPARIN (PORCINE) 25000 UT/250ML-% IV SOLN: 900.0000 [IU]/h | INTRAVENOUS | Status: DC

## 2019-02-26 NOTE — Progress Notes (Signed)
S/p (R)IJ HD cath placed 10/23 Has been on heparin gtt and Coumadin for Afib. Noted bleeding from catheter skin site since yesterday.  Heparin turned off a few hours ago as INR now therapeutic at 1.8. Surgifoam weighted pressure dressing applied by nursing team a few hours ago as well.  Dressing taken down upon arrival by myself. Blood noted on very bottom surgifoam gauze. Once everything removed, there appeared to be no active bleeding. Direct pressure held on the insertion site and tract for an additional 5 minutes. No further active bleed seen. Quick Clot hemostatic gauze pad applied, followed by compression dressing.  Most likely bleeding ceased now that heparin has been off a few hours.  Ascencion Dike PA-C Interventional Radiology 02/26/2019 1:39 PM

## 2019-02-26 NOTE — Progress Notes (Signed)
[name]    NEPHROLOGY PROGRESS NOTE  SUBJECTIVE: Patient seen and examined. Denies chest pain or shortness of breath.  Continues to have lower extremity edema complains of generalized weakness.  Was bleeding from right IJ dialysis catheter this morning.  Pressure dressing applied.  Denies fevers, chills, nausea, vomiting, diarrhea or dysuria.  All other review of systems are negative.  Status post dialysis x2.  OBJECTIVE:  Vitals:   02/26/19 1135 02/26/19 1353  BP: 111/83 119/81  Pulse:  71  Resp:    Temp:  98.1 F (36.7 C)  SpO2:  94%    Intake/Output Summary (Last 24 hours) at 02/26/2019 1430 Last data filed at 02/26/2019 1300 Gross per 24 hour  Intake 1080 ml  Output 3464 ml  Net -2384 ml      General:  AAOx3 NAD HEENT: MMM [place] AT anicteric sclera Neck:  No JVD, no adenopathy CV:  Heart RRR  Lungs:  L/S CTA bilaterally Abd:  abd SNT/ND with normal BS GU:  Bladder non-palpable Extremities: +3 bilateral lower extremity edema Skin:  No skin rash  MEDICATIONS:  . amiodarone  200 mg Oral BID  . atorvastatin  80 mg Oral q1800  . Chlorhexidine Gluconate Cloth  6 each Topical Daily  . metolazone  10 mg Oral Daily  . midodrine  10 mg Oral TID WC  . sildenafil  20 mg Oral BID  . sodium chloride flush  10 mL Intravenous Q12H  . torsemide  100 mg Oral BID  . warfarin  5 mg Oral ONCE-1800  . Warfarin - Pharmacist Dosing Inpatient   Does not apply q1800       LABS:   CBC Latest Ref Rng & Units 02/26/2019 02/25/2019 02/24/2019  WBC 4.0 - 10.5 K/uL 9.5 7.8 6.9  Hemoglobin 13.0 - 17.0 g/dL 13.8 13.0 12.5(L)  Hematocrit 39.0 - 52.0 % 42.2 40.6 37.6(L)  Platelets 150 - 400 K/uL 155 196 212    CMP Latest Ref Rng & Units 02/26/2019 02/25/2019 02/24/2019  Glucose 70 - 99 mg/dL 105(H) 131(H) 97  BUN 8 - 23 mg/dL 33(H) 50(H) 75(H)  Creatinine 0.61 - 1.24 mg/dL 3.56(H) 3.74(H) 4.53(H)  Sodium 135 - 145 mmol/L 139 142 140  Potassium 3.5 - 5.1 mmol/L 4.8  4.0 3.3(L)  Chloride 98 - 111 mmol/L 100 100 95(L)  CO2 22 - 32 mmol/L 28 30 32  Calcium 8.9 - 10.3 mg/dL 9.4 9.0 9.5  Total Protein 6.5 - 8.1 g/dL - - -  Total Bilirubin 0.3 - 1.2 mg/dL - - -  Alkaline Phos 38 - 126 U/L - - -  AST 15 - 41 U/L - - -  ALT 0 - 44 U/L - - -    Lab Results  Component Value Date   CALCIUM 9.4 02/26/2019   CAION 1.12 (L) 02/16/2019   PHOS 4.9 (H) 01/04/2019       Component Value Date/Time   COLORURINE YELLOW 01/02/2019 2027   APPEARANCEUR CLEAR 01/02/2019 2027   LABSPEC 1.010 01/02/2019 2027   PHURINE 5.0 01/02/2019 2027   GLUCOSEU NEGATIVE 01/02/2019 2027   GLUCOSEU NEGATIVE 07/21/2018 1114   HGBUR NEGATIVE 01/02/2019 2027   BILIRUBINUR NEGATIVE 01/02/2019 2027   KETONESUR NEGATIVE 01/02/2019 2027   PROTEINUR NEGATIVE 01/02/2019 2027   UROBILINOGEN 1.0 07/21/2018 1114   NITRITE NEGATIVE 01/02/2019 2027   LEUKOCYTESUR NEGATIVE 01/02/2019 2027      Component Value Date/Time   PHART 7.425 12/06/2018 1413   PCO2ART 40.5 12/06/2018 1413  PO2ART 82.0 (L) 12/06/2018 1413   HCO3 30.0 (H) 02/16/2019 1022   TCO2 31 02/16/2019 1022   ACIDBASEDEF 1.0 11/17/2014 0315   O2SAT 76.5 02/26/2019 0615       Component Value Date/Time   IRON 72 06/17/2009 1025   IRONPCTSAT 21.5 06/17/2009 1025       ASSESSMENT/PLAN:    63 year old black male with a significant cardiomyopathy and fairly advanced CKD now in trouble with volume overload 1.  Chronic kidney disease stage IV.  His baseline serum creatinine runs in the high twos.  Likely now end-stage.  Will discuss fistula placement with patient.  2.  Acute kidney injury.  Suspect cardiorenal syndrome in the setting of significant cardiomyopathy and fluid overload.  Second dialysis treatment today.  We will continue to monitor for dialysis need.  Next dialysis on Monday.  3.  Hypertension.  Continues to be volume overloaded.  Should improve with dialysis.  4.  Hypokalemia.  Improved with dialysis.  5.   Metabolic bone disease.  Phosphorus stable.   [place], DO, [place]

## 2019-02-26 NOTE — Progress Notes (Addendum)
Bleeding noted at right chest HD cath side, with blood extending past dressing.  Spoke with Verdene Lennert Civil engineer, contracting of HD) for assistance.  Instructions received to remove dressing, leave bio patch in place, place surgifoam around site, place compression dressing, then add 500 cc NS back and tape it, recheck in 30 minutes, leave for the rest of the day.  Verdene Lennert stated that IR needs to see patient.  IR oncall RNs [place] and [place] paged.  Awaiting call back.  Lennette Bihari, [place] with IR called back at 1230 and states that he will be up to assess patient.

## 2019-02-26 NOTE — Progress Notes (Signed)
Patient ID: [name], male   DOB: 1955/12/09, 63 y.o.   MRN: [id number]     Advanced Heart Failure Rounding Note  PCP-Cardiologist: Minus Breeding, MD  [facility]: Dr. Haroldine Laws   Subjective:    Now s/p HD x 2 sessions. Weight down 15 pounds in two days (37 pounds total). Less bloated. Feels weak but denies SOB, orthopnea or PND.  Co-ox 76% off inotorpes.   Had bleeding from dialysis cath site. Heparin now on hold. INR 1.8. Hgb stable. Pressure dressing on site.     Objective:   Weight Range: 108.8 kg Body mass index is 34.41 kg/m.   Vital Signs:   Temp:  [98.1 F (36.7 C)-98.5 F (36.9 C)] 98.2 F (36.8 C) (10/24 2100) Pulse Rate:  [64-81] 81 (10/24 2100) Resp:  [21-23] 21 (10/24 2100) BP: (86-114)/(51-84) 111/83 (10/25 1135) SpO2:  [96 %-100 %] 96 % (10/24 2100) Weight:  [108.8 kg-111 kg] 108.8 kg (10/25 0645) Last BM Date: 02/24/19  Weight change: Filed Weights   02/25/19 0620 02/25/19 1729 02/26/19 0645  Weight: 111.5 kg 111 kg 108.8 kg    Intake/Output:   Intake/Output Summary (Last 24 hours) at 02/26/2019 1248 Last data filed at 02/26/2019 1000 Gross per 24 hour  Intake 1080 ml  Output 3339 ml  Net -2259 ml      Physical Exam   General:  Lying in bed . No resp difficulty HEENT: normal Neck: supple. RIJ TDC. Pressure dressingin place. Carotids 2+ bilat; no bruits. No lymphadenopathy or thryomegaly appreciated. Cor: PMI nondisplaced. Regular rate & rhythm. No rubs, gallops or murmurs. Lungs: clear Abdomen: obese soft, nontender, nondistended. No hepatosplenomegaly. No bruits or masses. Good bowel sounds. Extremities: no cyanosis, clubbing, rash, 1+ edema  + UNNA  Neuro: alert & orientedx3, cranial nerves grossly intact. moves all 4 extremities w/o difficulty. Affect pleasant  Telemetry   SR 70-80s Personally reviewed   EKG    N/A  Labs    CBC Recent Labs    02/25/19 0452 02/26/19 0620  WBC 7.8 9.5  HGB 13.0 13.8  HCT 40.6 42.2  MCV  97.6 97.9  PLT 196 212   Basic Metabolic Panel Recent Labs    02/25/19 0452 02/26/19 0620  NA 142 139  K 4.0 4.8  CL 100 100  CO2 30 28  GLUCOSE 131* 105*  BUN 50* 33*  CREATININE 3.74* 3.56*  CALCIUM 9.0 9.4  MG 2.2 2.0   Liver Function Tests No results for input(s): AST, ALT, ALKPHOS, BILITOT, PROT, ALBUMIN in the last 72 hours. No results for input(s): LIPASE, AMYLASE in the last 72 hours. Cardiac Enzymes No results for input(s): CKTOTAL, CKMB, CKMBINDEX, TROPONINI in the last 72 hours.  BNP: BNP (last 3 results) Recent Labs    07/19/18 1106 01/02/19 1552 02/13/19 1114  BNP 1,608.5* 2,154.7* 2,637.3*    ProBNP (last 3 results) Recent Labs    11/01/18 1229  PROBNP 2,158.0*     D-Dimer No results for input(s): DDIMER in the last 72 hours. Hemoglobin A1C No results for input(s): HGBA1C in the last 72 hours. Fasting Lipid Panel No results for input(s): CHOL, HDL, LDLCALC, TRIG, CHOLHDL, LDLDIRECT in the last 72 hours. Thyroid Function Tests No results for input(s): TSH, T4TOTAL, T3FREE, THYROIDAB in the last 72 hours.  Invalid input(s): FREET3  Other results:   Imaging    No results found.   Medications:     Scheduled Medications: . amiodarone  200 mg Oral BID  . atorvastatin  80 mg Oral q1800  . Chlorhexidine Gluconate Cloth  6 each Topical Daily  . metolazone  10 mg Oral Daily  . midodrine  10 mg Oral TID WC  . sildenafil  20 mg Oral BID  . sodium chloride flush  10 mL Intravenous Q12H  . torsemide  100 mg Oral BID  . warfarin  5 mg Oral ONCE-1800  . Warfarin - Pharmacist Dosing Inpatient   Does not apply q1800    Infusions: . sodium chloride      PRN Medications: sodium chloride, acetaminophen, loperamide, ondansetron (ZOFRAN) IV, sodium chloride flush    Patient Profile   63 y/o male with severe systolic HF with biventricular dysfunction due to NICM and advanced CKD. Recently hospitalized with low output HF. Now returns with  progressive NYHA IV symptoms with marked fluid overload not responding to titration of oral diuretic regimen. With CKD 4, biventricular HF and morbid obesity advanced options are quite limited.  Assessment/Plan    1. Acute on Chronic Systolic HF with biventricular dysfunction due to NICM: mild nonobstructive CAD on [facility] 12/2018. CM out of proportion of CAD. PYP scan and myeloma panel completed and were not suggestive of cardiac amyloidosis. S/p St Jude ICD. ECHO 07/28/2017 EF 20-25%. RV mildly dilated. Echo 11/17/18 EF 20-25% RV dilated and severely HK with septal flattening. Presented to ED 10/12 w/ NYHA IV symptoms and markedly volume overload w/ 3+ bilateral LEE, left basilar rales and elevated JVD to level of ear. Admit Wt up 10+ lb from home baseline. BNP 2600 on admit.  -Poor initial response to high dose IV diuretics w/ increase in SCr from  2.76>>3.30, prompting initiation of inotropic support, started on milrinone. Milrinone later stopped due to ventricular ectopy.  - [place] 10/15 with R sided HF and PAH.  - Now s/p HD catheter placment. Started HD on 10/23. Now s/p HD x2 - Volume status continues to improve. Appreciate Renal input  - Co-ox 77%  2. Acute on CKD stage 3 - Now s/p HD catheter placment.Started HD on 10/23. Now s/p HD x2  -Nephrology appreciated - Bleeding at HD site. Heparin now off. Pressure dressing in place. Appears stable    3. CAD - LHC8/20with LAD 20%50% distal left circ and 20% mid RCA stenosis. No significant disease in left main or LAD. - No s/s ischemia Hs Trop 284 but likely demand ischemia in the setting of a/c CHF and abnormal renal function.   - Continue statin. - Hold ? blocker due to soft BP/ concern for low output - No ASA due to chronic coumadin  4. PAF  -s/p recent DC-CV for AFL.  - Continue amio 200 mg bid, he is in NSR.    - Coumadin held for [facility]. INR 1.8. Holding heparin with bleeding. Discussed dosing with PharmD personally.   6. VT -Has St  Jude ICD.  -Had VT episode (rate 190 bpm) w/ delivery of shock on 02/07/19  -Started on IV amiodarone 10/14 w/ improvement, NSVT less frequent off milrinone.  - Continue amiodarone 200 mg po twice a day.  -Keep K >4.0 and Mg > 2.0 .  7. Pulmonary HTN - On home O2 (2L).  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 Had negative V/Q scan ruling out CTEPH.  - RHC PVR 6.5. Started on sildenafil 20 mg three times a day.  - Off sildenafil due to low BP and need to preserve renal perfusion  8. Hypokalemia: K 4.8 today  Length of Stay: [place], MD  02/26/2019, 12:48 PM  Advanced Heart Failure Team Pager [phone] (M-F; 7a - 4p)  Please contact [facility] for night-coverage after hours (4p -7a ) and weekends on [email or web address]

## 2019-02-26 NOTE — Progress Notes (Signed)
Unna boots rolling down pt legs- removed and will hand off to day shift RN to clarify if need to be replaced.

## 2019-02-26 NOTE — Progress Notes (Addendum)
ANTICOAGULATION CONSULT NOTE - Follow Up Consult  Pharmacy Consult for Warfarin and heparin  Indication: atrial fibrillation  No Known Allergies  Patient Measurements: Height: 5\' 10"  (177.8 cm) Weight: 239 lb 12.8 oz (108.8 kg) IBW/kg (Calculated) : 73  Heparin dosing weight: 101 kg  Vital Signs: BP: 111/83 (10/25 1135)  Labs: Recent Labs    02/24/19 0526 02/25/19 0452  02/26/19 0620 02/26/19 0748 02/26/19 1134  HGB 12.5* 13.0  --  13.8  --   --   HCT 37.6* 40.6  --  42.2  --   --   PLT 212 196  --  155  --   --   LABPROT 21.9* 19.1*  --  20.9*  --   --   INR 1.9* 1.6*  --  1.8*  --   --   HEPARINUNFRC  --   --    < > 1.03* 1.07* 0.69  CREATININE 4.53* 3.74*  --  3.56*  --   --    < > = values in this interval not displayed.    Estimated Creatinine Clearance: 26.6 mL/min (A) (by C-G formula based on SCr of 3.56 mg/dL (H)).   Medical History: Past Medical History:  Diagnosis Date  . CAD (coronary artery disease)    a. Initial nonobst by cath 2009. b. Cath 01/2013 in setting of VT storm: obstructive distal Cx disease (small and terminates in the AV groove, unlikely to cause significant ischemia or electrical instability), nonobstructive RCA disease, EF 15-20%.   . Cerebrovascular accident [facility])    a. 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 Chronic systolic CHF (congestive heart failure) ([place])    a. Likely NICM (out of proportion to CAD). b. 2009 - EF 25-30% by echo, 01/2013: 15-20% by cath. c. 11/2014 Echo: EF 35-40%, Gr1 DD, mild MR, mod TR, PASP 37mmHg.  . CKD (chronic kidney disease), stage II   . Dyslipidemia   . Gout   . HTN (hypertension)   . Hypokalemia   . ICD (implantable cardiac defibrillator) in place   . Insulin dependent diabetes mellitus   . Lipoma   . Nonischemic cardiomyopathy ([place])    a.  11/2014 Echo: EF 35-40%, Gr1 DD, mild MR, mod TR, PASP 67mmHg.  . OSA (obstructive sleep apnea)    does not wear cpap  . PAF (paroxysmal atrial fibrillation) ([place])     a. Noted 05/2008 by EKG;  b. CHA2DS2VASc = 5-6-->coumadin.  . Paroxysmal VT ([place])    a. s/p St. Jude ICD 2007. b. H/o paroxysmal VT/VF including VT storm 12/2012 admission prompting amio initiation;  c. 01/2013 ICD upgrade SJM [id number] Ellipse VR single lead ICD.  [name] Pulmonary HTN ([place])    a. Mild by cath 01/2013.    Medications:  Scheduled:  . amiodarone  200 mg Oral BID  . atorvastatin  80 mg Oral q1800  . Chlorhexidine Gluconate Cloth  6 each Topical Daily  . metolazone  10 mg Oral Daily  . midodrine  10 mg Oral TID WC  . sildenafil  20 mg Oral BID  . sodium chloride flush  10 mL Intravenous Q12H  . torsemide  100 mg Oral BID  . warfarin  5 mg Oral ONCE-1800  . Warfarin - Pharmacist Dosing Inpatient   Does not apply q1800   Infusions:  . sodium chloride      Assessment: Pt is a 63 y/o male with PMH of CHF (EF 20-25%), prior VT s/p St Jude ICD, CAD, CVA, OSA  on CPAP, pulmonary HTN, HTN, DM, CKDs stage III, and PAF on coumadin PTA (5mg  daily, except 2.5 mg on fridays). Pharmacy has been consulted to dose warfarin s/p HD catheter placed 10/23.  INR today still sub-therapeutic but rising.  No overt bleeding or complications noted.  Heparin level unexpectedly elevated.  Spoke to RN, appears it was being drawn from central line where heparin was running, however infusion was paused and line flushed prior to collection.  Asked phlebotomy to redraw level peripherally, and heparin level actually slightly higher.   CBC stable.  Heparin gtt stopped..  RN reports bleeding at HD catheter site, currently with pressure dressing.  Rechecked heparin level after heparin had been off for about 2 hrs, level still elevated at 0.69.  Goal of Therapy:  HL 0.3-0.7 INR 2-3 Monitor platelets by anticoagulation protocol: Yes   Plan:  -Spoke to Dr. Haroldine Laws.  Since INR close to therapeutic at 1.8, and patient now having issues with HD catheter site bleeding, will leave heparin off at this point.   -Warfarin 5mg  PO x1 tonight, hopefully INR over 2 tomorrow. -Daily INR and CBC and HL -Monitor signs of bleeding   Thank you,   Marguerite Olea, [facility] Clinical Pharmacist Phone [phone]  02/26/2019 1:10 PM

## 2019-02-26 NOTE — Progress Notes (Addendum)
CBG-95- gave pt orange juice- will monitor and recheck cbg   2225- CBG 139

## 2019-02-27 ENCOUNTER — Encounter (HOSPITAL_COMMUNITY): Payer: Medicare Other

## 2019-02-27 DIAGNOSIS — N186 End stage renal disease: Secondary | ICD-10-CM

## 2019-02-27 DIAGNOSIS — Z992 Dependence on renal dialysis: Secondary | ICD-10-CM

## 2019-02-27 DIAGNOSIS — I5043 Acute on chronic combined systolic (congestive) and diastolic (congestive) heart failure: Secondary | ICD-10-CM | POA: Diagnosis not present

## 2019-02-27 LAB — CBC
HCT: 41.7 % (ref 39.0–52.0)
Hemoglobin: 13.6 g/dL (ref 13.0–17.0)
MCH: 31.7 pg (ref 26.0–34.0)
MCHC: 32.6 g/dL (ref 30.0–36.0)
MCV: 97.2 fL (ref 80.0–100.0)
Platelets: 158 10*3/uL (ref 150–400)
RBC: 4.29 MIL/uL (ref 4.22–5.81)
RDW: 16.2 % — ABNORMAL HIGH (ref 11.5–15.5)
WBC: 8.3 10*3/uL (ref 4.0–10.5)
nRBC: 0 % (ref 0.0–0.2)

## 2019-02-27 LAB — GLUCOSE, CAPILLARY
Glucose-Capillary: 99 mg/dL (ref 70–99)
Glucose-Capillary: 113 mg/dL — ABNORMAL HIGH (ref 70–99)
Glucose-Capillary: 130 mg/dL — ABNORMAL HIGH (ref 70–99)
Glucose-Capillary: 159 mg/dL — ABNORMAL HIGH (ref 70–99)

## 2019-02-27 LAB — BASIC METABOLIC PANEL
Anion gap: 13 (ref 5–15)
BUN: 40 mg/dL — ABNORMAL HIGH (ref 8–23)
CO2: 29 mmol/L (ref 22–32)
Calcium: 9.6 mg/dL (ref 8.9–10.3)
Chloride: 95 mmol/L — ABNORMAL LOW (ref 98–111)
Creatinine, Ser: 3.86 mg/dL — ABNORMAL HIGH (ref 0.61–1.24)
GFR calc Af Amer: 18 mL/min — ABNORMAL LOW (ref >60)
GFR calc non Af Amer: 16 mL/min — ABNORMAL LOW (ref >60)
Glucose, Bld: 91 mg/dL (ref 70–99)
Potassium: 3.8 mmol/L (ref 3.5–5.1)
Sodium: 137 mmol/L (ref 135–145)

## 2019-02-27 LAB — PROTIME-INR
INR: 1.7 — ABNORMAL HIGH (ref 0.8–1.2)
Prothrombin Time: 20.2 seconds — ABNORMAL HIGH (ref 11.4–15.2)

## 2019-02-27 LAB — MAGNESIUM: Magnesium: 1.9 mg/dL (ref 1.7–2.4)

## 2019-02-27 MED ORDER — HEPARIN SODIUM (PORCINE) 1000 UNIT/ML IJ SOLN
INTRAMUSCULAR | Status: AC
  Administered 2019-02-27: 3400 [IU]
  Filled 2019-02-27: qty 4

## 2019-02-27 NOTE — Progress Notes (Signed)
RN assessed patient HD cath site on R upper chest. Site with bright red blood. Taped 559mL saline bag to site to apply pressure. Will page IR and relay to night shift RN.

## 2019-02-27 NOTE — Progress Notes (Signed)
PT Cancellation Note  Patient Details Name: [name] MRN: [id number] DOB: 05-16-1955   Cancelled Treatment:    Reason Eval/Treat Not Completed: Patient at procedure or test/unavailable. Pt at HD. Will try again later.   Shary Decamp [facility] 02/27/2019, 8:43 AM  [place] Pager [phone] Office [phone]

## 2019-02-27 NOTE — Progress Notes (Signed)
Renal Navigator was notified by Nephrologist/Dr. Maryjane Hurter that patient needs OP HD treatment referral. Renal Navigator met with patient to complete assessment and submitted referral to [facility] to request treatment at [facility] for a second shift seat on any schedule if possible. Renal Navigator will follow closely.  Alphonzo Cruise, [place] Renal Navigator [phone]

## 2019-02-27 NOTE — Progress Notes (Signed)
[facility]    NEPHROLOGY PROGRESS NOTE  SUBJECTIVE: Patient seen and examined. Denies chest pain or shortness of breath.  Continues to have lower extremity edema.  For dialysis today.  No further bleeding from dialysis catheter noted. Denies fevers, chills, nausea, vomiting, diarrhea or dysuria.  All other review of systems are negative.    OBJECTIVE:  Vitals:   02/27/19 0800 02/27/19 0830  BP: (!) 89/58 (!) 85/56  Pulse: 88 93  Resp: (!) 27 16  Temp:    SpO2:      Intake/Output Summary (Last 24 hours) at 02/27/2019 3875 Last data filed at 02/27/2019 0604 Gross per 24 hour  Intake 1090 ml  Output 1850 ml  Net -760 ml      General:  AAOx3 NAD HEENT: MMM [place] AT anicteric sclera Neck:  No JVD, no adenopathy CV:  Heart RRR  Lungs:  L/S CTA bilaterally Abd:  abd SNT/ND with normal BS GU:  Bladder non-palpable Extremities: +3 bilateral lower extremity edema Skin:  No skin rash  MEDICATIONS:  . amiodarone  200 mg Oral BID  . atorvastatin  80 mg Oral q1800  . Chlorhexidine Gluconate Cloth  6 each Topical Daily  . metolazone  10 mg Oral Daily  . midodrine  10 mg Oral TID WC  . sildenafil  20 mg Oral BID  . sodium chloride flush  10 mL Intravenous Q12H  . torsemide  100 mg Oral BID  . Warfarin - Pharmacist Dosing Inpatient   Does not apply q1800       LABS:   CBC Latest Ref Rng & Units 02/27/2019 02/26/2019 02/25/2019  WBC 4.0 - 10.5 K/uL 8.3 9.5 7.8  Hemoglobin 13.0 - 17.0 g/dL 13.6 13.8 13.0  Hematocrit 39.0 - 52.0 % 41.7 42.2 40.6  Platelets 150 - 400 K/uL 158 155 196    CMP Latest Ref Rng & Units 02/27/2019 02/26/2019 02/25/2019  Glucose 70 - 99 mg/dL 91 105(H) 131(H)  BUN 8 - 23 mg/dL 40(H) 33(H) 50(H)  Creatinine 0.61 - 1.24 mg/dL 3.86(H) 3.56(H) 3.74(H)  Sodium 135 - 145 mmol/L 137 139 142  Potassium 3.5 - 5.1 mmol/L 3.8 4.8 4.0  Chloride 98 - 111 mmol/L 95(L) 100 100  CO2 22 - 32 mmol/L 29 28 30   Calcium 8.9 - 10.3 mg/dL 9.6 9.4 9.0   Total Protein 6.5 - 8.1 g/dL - - -  Total Bilirubin 0.3 - 1.2 mg/dL - - -  Alkaline Phos 38 - 126 U/L - - -  AST 15 - 41 U/L - - -  ALT 0 - 44 U/L - - -    Lab Results  Component Value Date   CALCIUM 9.6 02/27/2019   CAION 1.12 (L) 02/16/2019   PHOS 4.9 (H) 01/04/2019       Component Value Date/Time   COLORURINE YELLOW 01/02/2019 2027   APPEARANCEUR CLEAR 01/02/2019 2027   LABSPEC 1.010 01/02/2019 2027   PHURINE 5.0 01/02/2019 2027   GLUCOSEU NEGATIVE 01/02/2019 2027   GLUCOSEU NEGATIVE 07/21/2018 1114   HGBUR NEGATIVE 01/02/2019 2027   BILIRUBINUR NEGATIVE 01/02/2019 2027   KETONESUR NEGATIVE 01/02/2019 2027   PROTEINUR NEGATIVE 01/02/2019 2027   UROBILINOGEN 1.0 07/21/2018 1114   NITRITE NEGATIVE 01/02/2019 2027   LEUKOCYTESUR NEGATIVE 01/02/2019 2027      Component Value Date/Time   PHART 7.425 12/06/2018 1413   PCO2ART 40.5 12/06/2018 1413   PO2ART 82.0 (L) 12/06/2018 1413   HCO3 30.0 (H) 02/16/2019 1022   TCO2 31  02/16/2019 1022   ACIDBASEDEF 1.0 11/17/2014 0315   O2SAT 76.5 02/26/2019 0615       Component Value Date/Time   IRON 72 06/17/2009 1025   IRONPCTSAT 21.5 06/17/2009 1025       ASSESSMENT/PLAN:    63 year old black male with a significant cardiomyopathy and fairly advanced CKD now in trouble with volume overload 1.  Chronic kidney disease stage IV.  His baseline serum creatinine runs in the high twos.  Likely now end-stage.  Patient agreeable to fistula placement.  Will consult vascular.  2.  Acute kidney injury.  Suspect cardiorenal syndrome in the setting of significant cardiomyopathy and fluid overload.  Her third dialysis treatment today.  Will plan for outpatient dialysis.  3.  Hypertension.  Continues to be volume overloaded.  Should improve with dialysis.  4.  Hypokalemia.  Improved with dialysis.  5.  Metabolic bone disease.  Phosphorus stable.   [place], DO, [place]

## 2019-02-27 NOTE — Progress Notes (Signed)
Patient ID: [name], male   DOB: Sep 18, 1955, 63 y.o.   MRN: [id number]     Advanced Heart Failure Rounding Note  PCP-Cardiologist: Minus Breeding, MD  [facility]: Dr. Haroldine Laws   Subjective:    Patient seen in HD. Tolerating well. Still volume overloaded. Bleeding from dialysis cath has stopped.   Feels weak. No CP or SOB. VVS consulted for fistula.   Off coumadin. INR 1.7.    Objective:   Weight Range: 108.6 kg Body mass index is 34.35 kg/m.   Vital Signs:   Temp:  [98 F (36.7 C)-98.5 F (36.9 C)] 98.5 F (36.9 C) (10/26 0700) Pulse Rate:  [70-93] (P) 88 (10/26 0906) Resp:  [16-27] (P) 17 (10/26 0906) BP: (85-125)/(56-83) (P) 99/70 (10/26 0906) SpO2:  [94 %-99 %] 99 % (10/26 0353) Weight:  [107.5 kg-108.6 kg] 108.6 kg (10/26 0700) Last BM Date: 02/24/19  Weight change: Filed Weights   02/26/19 0645 02/27/19 0353 02/27/19 0700  Weight: 108.8 kg 107.5 kg 108.6 kg    Intake/Output:   Intake/Output Summary (Last 24 hours) at 02/27/2019 1108 Last data filed at 02/27/2019 0604 Gross per 24 hour  Intake 730 ml  Output 1700 ml  Net -970 ml      Physical Exam   General:  Lying in bed. No resp difficulty HEENT: normal Neck: supple. JVP 9 RIJ TLC RIJ TDC. Carotids 2+ bilat; no bruits. No lymphadenopathy or thryomegaly appreciated. Cor: PMI nondisplaced. Regular rate & rhythm. No rubs, gallops or murmurs. Lungs: clear Abdomen: obese soft, nontender, nondistended. No hepatosplenomegaly. No bruits or masses. Good bowel sounds. Extremities: no cyanosis, clubbing, rash, 2+ edema Neuro: alert & orientedx3, cranial nerves grossly intact. moves all 4 extremities w/o difficulty. Affect pleasant   Telemetry   SR 80s Personally reviewed   EKG    N/A  Labs    CBC Recent Labs    02/26/19 0620 02/27/19 0500  WBC 9.5 8.3  HGB 13.8 13.6  HCT 42.2 41.7  MCV 97.9 97.2  PLT 155 841   Basic Metabolic Panel Recent Labs    02/26/19 0620 02/27/19 0500  NA  139 137  K 4.8 3.8  CL 100 95*  CO2 28 29  GLUCOSE 105* 91  BUN 33* 40*  CREATININE 3.56* 3.86*  CALCIUM 9.4 9.6  MG 2.0 1.9   Liver Function Tests No results for input(s): AST, ALT, ALKPHOS, BILITOT, PROT, ALBUMIN in the last 72 hours. No results for input(s): LIPASE, AMYLASE in the last 72 hours. Cardiac Enzymes No results for input(s): CKTOTAL, CKMB, CKMBINDEX, TROPONINI in the last 72 hours.  BNP: BNP (last 3 results) Recent Labs    07/19/18 1106 01/02/19 1552 02/13/19 1114  BNP 1,608.5* 2,154.7* 2,637.3*    ProBNP (last 3 results) Recent Labs    11/01/18 1229  PROBNP 2,158.0*     D-Dimer No results for input(s): DDIMER in the last 72 hours. Hemoglobin A1C No results for input(s): HGBA1C in the last 72 hours. Fasting Lipid Panel No results for input(s): CHOL, HDL, LDLCALC, TRIG, CHOLHDL, LDLDIRECT in the last 72 hours. Thyroid Function Tests No results for input(s): TSH, T4TOTAL, T3FREE, THYROIDAB in the last 72 hours.  Invalid input(s): FREET3  Other results:   Imaging    No results found.   Medications:     Scheduled Medications: . heparin      . amiodarone  200 mg Oral BID  . atorvastatin  80 mg Oral q1800  . Chlorhexidine Gluconate Cloth  6  each Topical Daily  . metolazone  10 mg Oral Daily  . midodrine  10 mg Oral TID WC  . sildenafil  20 mg Oral BID  . sodium chloride flush  10 mL Intravenous Q12H  . torsemide  100 mg Oral BID  . Warfarin - Pharmacist Dosing Inpatient   Does not apply q1800    Infusions: . sodium chloride      PRN Medications: sodium chloride, acetaminophen, loperamide, ondansetron (ZOFRAN) IV, sodium chloride flush    Patient Profile   63 y/o male with severe systolic HF with biventricular dysfunction due to NICM and advanced CKD. Recently hospitalized with low output HF. Now returns with progressive NYHA IV symptoms with marked fluid overload not responding to titration of oral diuretic regimen. With CKD 4,  biventricular HF and morbid obesity advanced options are quite limited.  Assessment/Plan    1. Acute on Chronic Systolic HF with biventricular dysfunction due to NICM: mild nonobstructive CAD on [facility] 12/2018. CM out of proportion of CAD. PYP scan and myeloma panel completed and were not suggestive of cardiac amyloidosis. S/p St Jude ICD. ECHO 07/28/2017 EF 20-25%. RV mildly dilated. Echo 11/17/18 EF 20-25% RV dilated and severely HK with septal flattening. Presented to ED 10/12 w/ NYHA IV symptoms and markedly volume overload w/ 3+ bilateral LEE, left basilar rales and elevated JVD to level of ear. Admit Wt up 10+ lb from home baseline. BNP 2600 on admit.  -Poor initial response to high dose IV diuretics w/ increase in SCr from  2.76>>3.30, prompting initiation of inotropic support, started on milrinone. Milrinone later stopped due to ventricular ectopy.  - [place] 10/15 with R sided HF and PAH.  - Now s/p HD catheter placment. Started HD on 10/23. Now s/p HD x3 - Volume status continues to improve but still edematous. Appreciate Renal input   2. Acute on CKD stage 3 - Now s/p HD catheter placment.Started HD on 10/23. Now s/p HD x3 -Nephrology appreciated - Bleeding at HD site has resolved.  - VVS consulted for AVF - Will need to arrange for outpatient HD  3. CAD - LHC8/20with LAD 20%50% distal left circ and 20% mid RCA stenosis. No significant disease in left main or LAD. - No s/s ischemiaHs Trop 284 but likely demand ischemia in the setting of a/c CHF and abnormal renal function.   - Continue statin. - Hold ? blocker due to soft BP/ concern for low output - No ASA due to chronic coumadin  4. PAF  -s/p recent DC-CV for AFL.  - Continue amio 200 mg bid, he is in NSR.    - Coumadin held for [facility]. INR 1.7. Holding heparin with bleeding can restart ina  Day or two. Discussed dosing with PharmD personally   6. VT -Has St Jude ICD.  -Had VT episode (rate 190 bpm) w/ delivery of shock on  02/07/19  -Started on IV amiodarone 10/14 w/ improvement, NSVT less frequent off milrinone.  - Continue amiodarone 200 mg po twice a day.  -Keep K >4.0 and Mg > 2.0 .  7. Pulmonary HTN - On home O2 (2L).  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 Had negative V/Q scan ruling out CTEPH.  - RHC PVR 6.5. Started on sildenafil 20 mg three times a day.  - Off sildenafil due to low BP and need to preserve renal perfusion  8. Hypokalemia: K 3.8 today     Length of Stay: [place]  Kessa Fairbairn, MD  02/27/2019, 11:08 AM  Advanced Heart Failure Team Pager [phone] (M-F; 7a - 4p)  Please contact [facility] for night-coverage after hours (4p -7a ) and weekends on [email or web address]

## 2019-02-27 NOTE — Progress Notes (Addendum)
Notified by CCM pt had 9 beat run V-tach- pt had just sat up on side of bed again- pt was comfortable I will continue to monitor closely

## 2019-02-27 NOTE — Progress Notes (Signed)
Per Dr. Vernard Gambles MD- IR  Keep pressure bag on area and patient up in 90 degree position throughout the night- continue to monitor bleeding.   Claudine Mouton, RN

## 2019-02-27 NOTE — Progress Notes (Signed)
ANTICOAGULATION CONSULT NOTE - Follow Up Consult  Pharmacy Consult for Warfarin and heparin  Indication: atrial fibrillation  No Known Allergies  Patient Measurements: Height: 5\' 10"  (177.8 cm) Weight: 239 lb 6.7 oz (108.6 kg) IBW/kg (Calculated) : 73  Heparin dosing weight: 101 kg  Vital Signs: Temp: 98 F (36.7 C) (10/26 1109) Temp Source: Oral (10/26 1109) BP: 114/65 (10/26 1109) Pulse Rate: 92 (10/26 1109)  Labs: Recent Labs    02/25/19 0452  02/26/19 0620 02/26/19 0748 02/26/19 1134 02/27/19 0500  HGB 13.0  --  13.8  --   --  13.6  HCT 40.6  --  42.2  --   --  41.7  PLT 196  --  155  --   --  158  LABPROT 19.1*  --  20.9*  --   --  20.2*  INR 1.6*  --  1.8*  --   --  1.7*  HEPARINUNFRC  --    < > 1.03* 1.07* 0.69  --   CREATININE 3.74*  --  3.56*  --   --  3.86*   < > = values in this interval not displayed.    Estimated Creatinine Clearance: 24.5 mL/min (A) (by C-G formula based on SCr of 3.86 mg/dL (H)).   Medical History: Past Medical History:  Diagnosis Date  . CAD (coronary artery disease)    a. Initial nonobst by cath 2009. b. Cath 01/2013 in setting of VT storm: obstructive distal Cx disease (small and terminates in the AV groove, unlikely to cause significant ischemia or electrical instability), nonobstructive RCA disease, EF 15-20%.   . Cerebrovascular accident [facility])    a. 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 Chronic systolic CHF (congestive heart failure) ([place])    a. Likely NICM (out of proportion to CAD). b. 2009 - EF 25-30% by echo, 01/2013: 15-20% by cath. c. 11/2014 Echo: EF 35-40%, Gr1 DD, mild MR, mod TR, PASP 1mmHg.  . CKD (chronic kidney disease), stage II   . Dyslipidemia   . Gout   . HTN (hypertension)   . Hypokalemia   . ICD (implantable cardiac defibrillator) in place   . Insulin dependent diabetes mellitus   . Lipoma   . Nonischemic cardiomyopathy ([place])    a.  11/2014 Echo: EF 35-40%, Gr1 DD, mild MR, mod TR, PASP 47mmHg.  . OSA  (obstructive sleep apnea)    does not wear cpap  . PAF (paroxysmal atrial fibrillation) ([place])    a. Noted 05/2008 by EKG;  b. CHA2DS2VASc = 5-6-->coumadin.  . Paroxysmal VT ([place])    a. s/p St. Jude ICD 2007. b. H/o paroxysmal VT/VF including VT storm 12/2012 admission prompting amio initiation;  c. 01/2013 ICD upgrade SJM 1411-36Q Ellipse VR single lead ICD.  [name] Pulmonary HTN ([place])    a. Mild by cath 01/2013.    Medications:  Scheduled:  . amiodarone  200 mg Oral BID  . atorvastatin  80 mg Oral q1800  . Chlorhexidine Gluconate Cloth  6 each Topical Daily  . metolazone  10 mg Oral Daily  . midodrine  10 mg Oral TID WC  . sildenafil  20 mg Oral BID  . sodium chloride flush  10 mL Intravenous Q12H  . torsemide  100 mg Oral BID  . Warfarin - Pharmacist Dosing Inpatient   Does not apply q1800   Infusions:  . sodium chloride      Assessment: Pt is a 63 y/o male with PMH of CHF (EF 20-25%),  prior VT s/p St Jude ICD, CAD, CVA, OSA on CPAP, pulmonary HTN, HTN, DM, CKDs stage III, and PAF on coumadin PTA (5mg  daily, except 2.5 mg on fridays). Pharmacy has been consulted to dose warfarin s/p HD catheter placed 10/23.  INR today still sub-therapeutic 1.7.  No overt bleeding or complications noted - but oozing from HD cath site over weekend. CBC stable.  Heparin gtt stopped. - but will need to be restarted as brisge post fistula placement   Goal of Therapy:  HL 0.3-0.7 INR 2-3 Monitor platelets by anticoagulation protocol: Yes   Plan:  -hold warfarin for fistula placement - f/u restart and  need for heparin  -Daily INR and CBC and HL -Monitor signs of bleeding    Bonnita Nasuti Pharm.D. CPP, BCPS Clinical Pharmacist [phone] 02/27/2019 3:34 PM

## 2019-02-27 NOTE — Procedures (Signed)
I evaluated the patient while on hemodialysis.  I reviewed the patient's treatment plan.  Patient tolerating dialysis well.  Discussed with RN.  

## 2019-02-27 NOTE — Progress Notes (Signed)
OT Cancellation Note  Patient Details Name: JERMAINE NEUHARTH MRN: [id number] DOB: [birth year]   Cancelled Treatment:    Reason Eval/Treat Not Completed: Fatigue/lethargy limiting ability to participate;Other (comment) Pt reporting feeling "exhausted" asking if OT can return tomorrow. Will check back as time allows.  [place], [place] [facility] [phone] [place] 02/27/2019, 12:17 PM

## 2019-02-27 NOTE — Progress Notes (Signed)
PT Cancellation Note  Patient Details Name: [name] MRN: [id number] DOB: 1956/03/29   Cancelled Treatment:    Reason Eval/Treat Not Completed: Fatigue/lethargy limiting ability to participate. Pt reports he is exhausted after HD. Will continue attempts.    Shary Decamp [place] 02/27/2019, 3:22 PM [place] Pager [phone] Office [phone]

## 2019-02-27 NOTE — Consult Note (Addendum)
Hospital Consult    Reason for Consult:  Dialysis access Requesting Physician:  Dr. Maryjane Hurter MRN #:  [id number]  History of Present Illness: This is a 63 y.o. male with past medical history significant for hypertension, lochia, CHF paroxysmal atrial fibrillation was on Coumadin,  CAD, diabetes mellitus.  He is being seen in consultation for evaluation for dialysis access placement.  He is currently dialyzing via right IJ temporary dialysis catheter.[name]  He is right arm dominant and would prefer access placement in his left arm.  He has a left-sided pacemaker.  He is currently on Coumadin dosed by pharmacy with an INR as of today 1.7.    Past Medical History:  Diagnosis Date  . CAD (coronary artery disease)    a. Initial nonobst by cath 2009. b. Cath 01/2013 in setting of VT storm: obstructive distal Cx disease (small and terminates in the AV groove, unlikely to cause significant ischemia or electrical instability), nonobstructive RCA disease, EF 15-20%.   . Cerebrovascular accident [facility])    a. 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 Chronic systolic CHF (congestive heart failure) ([place])    a. Likely NICM (out of proportion to CAD). b. 2009 - EF 25-30% by echo, 01/2013: 15-20% by cath. c. 11/2014 Echo: EF 35-40%, Gr1 DD, mild MR, mod TR, PASP 22mmHg.  . CKD (chronic kidney disease), stage II   . Dyslipidemia   . Gout   . HTN (hypertension)   . Hypokalemia   . ICD (implantable cardiac defibrillator) in place   . Insulin dependent diabetes mellitus   . Lipoma   . Nonischemic cardiomyopathy ([place])    a.  11/2014 Echo: EF 35-40%, Gr1 DD, mild MR, mod TR, PASP 53mmHg.  . OSA (obstructive sleep apnea)    does not wear cpap  . PAF (paroxysmal atrial fibrillation) ([place])    a. Noted 05/2008 by EKG;  b. CHA2DS2VASc = 5-6-->coumadin.  . Paroxysmal VT ([place])    a. s/p St. Jude ICD 2007. b. H/o paroxysmal VT/VF including VT storm 12/2012 admission prompting amio initiation;  c. 01/2013 ICD upgrade SJM 1411-36Q  Ellipse VR single lead ICD.  [name] Pulmonary HTN ([place])    a. Mild by cath 01/2013.    Past Surgical History:  Procedure Laterality Date  . CARDIAC CATHETERIZATION     Nonobstructive coronary disease 2009  . CARDIAC DEFIBRILLATOR PLACEMENT     ICD-St. Jude  . CARDIOVERSION N/A 01/03/2019   Procedure: CARDIOVERSION;  Surgeon: Jolaine Artist, MD;  Location: [facility] ENDOSCOPY;  Service: Cardiovascular;  Laterality: N/A;  . CENTRAL LINE INSERTION  02/16/2019   Procedure: CENTRAL LINE INSERTION;  Surgeon: Jolaine Artist, MD;  Location: [place] CV LAB;  Service: Cardiovascular;;  . IMPLANTABLE CARDIOVERTER DEFIBRILLATOR (ICD) GENERATOR CHANGE N/A 01/15/2014   Procedure: ICD GENERATOR CHANGE;  Surgeon: Evans Lance, MD;  Location: [facility] CATH LAB;  Service: Cardiovascular;  Laterality: N/A;  . IR FLUORO GUIDE CV LINE RIGHT  02/24/2019  . IR US GUIDE VASC ACCESS RIGHT  02/24/2019  . LEFT AND RIGHT HEART CATHETERIZATION WITH CORONARY ANGIOGRAM N/A 01/03/2013   Procedure: LEFT AND RIGHT HEART CATHETERIZATION WITH CORONARY ANGIOGRAM;  Surgeon: Peter M Martinique, MD;  Location: [facility] CATH LAB;  Service: Cardiovascular;  Laterality: N/A;  . LIPOMA EXCISION    . RIGHT HEART CATH N/A 02/16/2019   Procedure: RIGHT HEART CATH;  Surgeon: Jolaine Artist, MD;  Location: [place] CV LAB;  Service: Cardiovascular;  Laterality: N/A;  . RIGHT/LEFT HEART CATH  AND CORONARY ANGIOGRAPHY N/A 10/04/2017   Procedure: RIGHT/LEFT HEART CATH AND CORONARY ANGIOGRAPHY;  Surgeon: Larey Dresser, MD;  Location: [place] CV LAB;  Service: Cardiovascular;  Laterality: N/A;  . RIGHT/LEFT HEART CATH AND CORONARY ANGIOGRAPHY N/A 12/06/2018   Procedure: RIGHT/LEFT HEART CATH AND CORONARY ANGIOGRAPHY;  Surgeon: Jolaine Artist, MD;  Location: [place] CV LAB;  Service: Cardiovascular;  Laterality: N/A;    No Known Allergies  Prior to Admission medications   Medication Sig Start Date End Date Taking? Authorizing Provider   amiodarone (PACERONE) 200 MG tablet Take 1 tablet (200 mg total) by mouth 2 (two) times daily. Patient taking differently: Take 200 mg by mouth daily.  01/05/19  Yes Swayze, Ava, DO  atorvastatin (LIPITOR) 80 MG tablet Take 80 mg by mouth daily at 6 PM.   Yes [provider]  carvedilol (COREG) 3.125 MG tablet Take 1 tablet (3.125 mg total) by mouth 2 (two) times daily with a meal. 02/03/19  Yes Biagio Borg, MD  Insulin Glargine (LANTUS SOLOSTAR) 100 UNIT/ML Solostar Pen Inject 10 units into the skin at bedtime   Yes [provider]  Insulin Pen Needle (B-D ULTRAFINE III SHORT PEN) 31G X 8 MM MISC USE ONCE DAILY WITH INSULIN 01/14/18  Yes Biagio Borg, MD  Insulin Pen Needle (PEN NEEDLES) 31G X 5 MM MISC 100 each by Does not apply route 2 (two) times daily. 11/12/17  Yes Shambley, Delphia Grates, NP  isosorbide-hydrALAZINE (BIDIL) 20-37.5 MG tablet Take 0.5 tablets by mouth 3 (three) times daily. 01/12/19  Yes Bensimhon, Shaune Pascal, MD  nitroGLYCERIN (NITROSTAT) 0.4 MG SL tablet Place 0.4 mg under the tongue every 5 (five) minutes as needed for chest pain.   Yes [provider]  OXYGEN Inhale into the lungs. 2L continuous; 3L on pulse machine   Yes [provider]  torsemide (DEMADEX) 20 MG tablet Take 2 tablets (40 mg total) by mouth daily. 01/06/19  Yes Swayze, Ava, DO  Turmeric 500 MG CAPS Take 1,000 mg by mouth daily.   Yes [provider]  warfarin (COUMADIN) 5 MG tablet Take 2.5-5 mg by mouth See admin instructions. 5mg  everyday except Friday; 2.5mg  on fridays   Yes [provider]    Social History   Socioeconomic History  . Marital status: Married    Spouse name: Not on file  . Number of children: 2  . Years of education: Not on file  . Highest education level: Not on file  Occupational History  . Occupation: medically retired due to heart failure  Social Needs  . Financial resource strain: Not on file  . Food insecurity    Worry: Not on  file    Inability: Not on file  . Transportation needs    Medical: Yes    Non-medical: No  Tobacco Use  . Smoking status: Former Smoker    Quit date: 05/04/1986    Years since quitting: 32.8  . Smokeless tobacco: Never Used  . Tobacco comment: Quit smoking 30 yrs ago. Smoked as teenager less than 1/2 ppd. Smoked x 4 years.  Substance and Sexual Activity  . Alcohol use: No    Comment: occasionally  . Drug use: No  . Sexual activity: Never  Lifestyle  . Physical activity    Days per week: 0 days    Minutes per session: 0 min  . Stress: Not at all  Relationships  . Social Herbalist on phone: Twice  a week    Gets together: Never    Attends religious service: Never    Active member of club or organization: No    Attends meetings of clubs or organizations: Never    Relationship status: Married  . Intimate partner violence    Fear of current or ex partner: No    Emotionally abused: No    Physically abused: No    Forced sexual activity: No  Other Topics Concern  . Not on file  Social History Narrative  . Not on file     Family History  Problem Relation Age of Onset  . Coronary artery disease Mother   . Heart attack Mother   . Heart attack Maternal Uncle   . Heart attack Maternal Grandmother     ROS: Otherwise negative unless mentioned in HPI  Physical Examination  Vitals:   02/27/19 1100 02/27/19 1109  BP: (!) 107/57 114/65  Pulse: 91 92  Resp: 18 19  Temp:  98 F (36.7 C)  SpO2:     Body mass index is 34.35 kg/m.  General:  WDWN in NAD Gait: Not observed HENT: WNL, normocephalic Pulmonary: normal non-labored breathing Cardiac: Irregular  Skin: without rashes Vascular Exam/Pulses: Symmetrical radial pulses Extremities: Bilateral lower extremity edema without active tissue ischemia Musculoskeletal: no muscle wasting or atrophy  Neurologic: A&O X 3;  No focal weakness or paresthesias are detected; speech is fluent/normal Psychiatric:  The pt  has Normal affect. Lymph:  Unremarkable  CBC    Component Value Date/Time   WBC 8.3 02/27/2019 0500   RBC 4.29 02/27/2019 0500   HGB 13.6 02/27/2019 0500   HCT 41.7 02/27/2019 0500   PLT 158 02/27/2019 0500   MCV 97.2 02/27/2019 0500   MCH 31.7 02/27/2019 0500   MCHC 32.6 02/27/2019 0500   RDW 16.2 (H) 02/27/2019 0500   LYMPHSABS 0.5 (L) 01/04/2019 0429   MONOABS 0.6 01/04/2019 0429   EOSABS 0.0 01/04/2019 0429   BASOSABS 0.0 01/04/2019 0429    BMET    Component Value Date/Time   NA 137 02/27/2019 0500   NA 150 (H) 08/24/2017 1417   K 3.8 02/27/2019 0500   CL 95 (L) 02/27/2019 0500   CO2 29 02/27/2019 0500   GLUCOSE 91 02/27/2019 0500   BUN 40 (H) 02/27/2019 0500   BUN 15 08/24/2017 1417   CREATININE 3.86 (H) 02/27/2019 0500   CREATININE 1.37 (H) 12/17/2015 1209   CALCIUM 9.6 02/27/2019 0500   GFRNONAA 16 (L) 02/27/2019 0500   GFRAA 18 (L) 02/27/2019 0500    COAGS: Lab Results  Component Value Date   INR 1.7 (H) 02/27/2019   INR 1.8 (H) 02/26/2019   INR 1.6 (H) 02/25/2019   PROTIME 18.2 10/11/2008     Non-Invasive Vascular Imaging:   Vein mapping pending   ASSESSMENT/PLAN: This is a 63 y.o. male with end-stage renal disease on hemodialysis  Currently dialyzing via right IJ temporary dialysis catheter Right arm dominant; restrict left upper extremity Vein mapping pending Plan is for exchange of catheter for tunneled dialysis catheter   Please convert from Coumadin back to heparin in preparation for surgery this week On-call vascular surgeon Dr. Oneida Alar will evaluate the patient later today and provide further treatment plans   Dagoberto Ligas PA-C [facility] [phone]   History and exam details as above.  Patient currently has a temporary right internal jugular vein dialysis catheter.  We will replace this with a tunneled dialysis catheter.  Although he  is right-handed he does have a left-sided pacemaker.  I discussed with  patient that he would be at risk of venous hypertension secondary to the pacemaker.  If he has reasonable veins in the right arm I believe we should consider placing a right arm AV graft versus fistula rather than the left arm.  We will DC warfarin.  Patient needs to be maintained on heparin until we have a access today for him.  We will follow up on vein mapping.  Ruta Hinds, MD [facility] [place] Office: [phone]

## 2019-02-28 ENCOUNTER — Inpatient Hospital Stay (HOSPITAL_COMMUNITY): Payer: Medicare Other

## 2019-02-28 DIAGNOSIS — Z01810 Encounter for preprocedural cardiovascular examination: Secondary | ICD-10-CM | POA: Diagnosis not present

## 2019-02-28 DIAGNOSIS — I5043 Acute on chronic combined systolic (congestive) and diastolic (congestive) heart failure: Secondary | ICD-10-CM | POA: Diagnosis not present

## 2019-02-28 LAB — BASIC METABOLIC PANEL
Anion gap: 14 (ref 5–15)
BUN: 26 mg/dL — ABNORMAL HIGH (ref 8–23)
CO2: 26 mmol/L (ref 22–32)
Calcium: 9.3 mg/dL (ref 8.9–10.3)
Chloride: 94 mmol/L — ABNORMAL LOW (ref 98–111)
Creatinine, Ser: 3.86 mg/dL — ABNORMAL HIGH (ref 0.61–1.24)
GFR calc Af Amer: 18 mL/min — ABNORMAL LOW (ref >60)
GFR calc non Af Amer: 16 mL/min — ABNORMAL LOW (ref >60)
Glucose, Bld: 113 mg/dL — ABNORMAL HIGH (ref 70–99)
Potassium: 3.8 mmol/L (ref 3.5–5.1)
Sodium: 134 mmol/L — ABNORMAL LOW (ref 135–145)

## 2019-02-28 LAB — CBC
HCT: 42.9 % (ref 39.0–52.0)
HCT: 42 % (ref 39.0–52.0)
Hemoglobin: 13.6 g/dL (ref 13.0–17.0)
Hemoglobin: 13.7 g/dL (ref 13.0–17.0)
MCH: 31.3 pg (ref 26.0–34.0)
MCH: 32.1 pg (ref 26.0–34.0)
MCHC: 31.7 g/dL (ref 30.0–36.0)
MCHC: 32.6 g/dL (ref 30.0–36.0)
MCV: 98.8 fL (ref 80.0–100.0)
MCV: 98.4 fL (ref 80.0–100.0)
Platelets: 64 10*3/uL — ABNORMAL LOW (ref 150–400)
Platelets: 62 10*3/uL — ABNORMAL LOW (ref 150–400)
RBC: 4.34 MIL/uL (ref 4.22–5.81)
RBC: 4.27 MIL/uL (ref 4.22–5.81)
RDW: 15.6 % — ABNORMAL HIGH (ref 11.5–15.5)
RDW: 15.6 % — ABNORMAL HIGH (ref 11.5–15.5)
WBC: 9 10*3/uL (ref 4.0–10.5)
WBC: 8.9 10*3/uL (ref 4.0–10.5)
nRBC: 0 % (ref 0.0–0.2)
nRBC: 0.2 % (ref 0.0–0.2)

## 2019-02-28 LAB — MAGNESIUM: Magnesium: 1.8 mg/dL (ref 1.7–2.4)

## 2019-02-28 LAB — GLUCOSE, CAPILLARY
Glucose-Capillary: 107 mg/dL — ABNORMAL HIGH (ref 70–99)
Glucose-Capillary: 102 mg/dL — ABNORMAL HIGH (ref 70–99)
Glucose-Capillary: 123 mg/dL — ABNORMAL HIGH (ref 70–99)
Glucose-Capillary: 109 mg/dL — ABNORMAL HIGH (ref 70–99)

## 2019-02-28 LAB — PROTIME-INR
INR: 1.9 — ABNORMAL HIGH (ref 0.8–1.2)
Prothrombin Time: 21.9 seconds — ABNORMAL HIGH (ref 11.4–15.2)

## 2019-02-28 MED ORDER — SODIUM CHLORIDE 0.9% FLUSH: 3.0000 mL | INTRAVENOUS | Status: DC | PRN

## 2019-02-28 MED ORDER — CHLORHEXIDINE GLUCONATE CLOTH 2 % EX PADS
6.0000 | MEDICATED_PAD | Freq: Every day | CUTANEOUS | Status: DC
  Administered 2019-03-02: 6 via TOPICAL

## 2019-02-28 MED ORDER — SODIUM CHLORIDE 0.9% FLUSH: 3.0000 mL | Freq: Two times a day (BID) | INTRAVENOUS | Status: DC

## 2019-02-28 MED ORDER — SODIUM CHLORIDE 0.9 % IV SOLN: 250.0000 mL | INTRAVENOUS | Status: DC | PRN

## 2019-02-28 NOTE — Progress Notes (Signed)
Right upper extremity vein mapping complete. Please see CV Proc tab. [place], RVT 1:19 PM  02/28/2019

## 2019-02-28 NOTE — Progress Notes (Signed)
ANTICOAGULATION CONSULT NOTE - Follow Up Consult  Pharmacy Consult for Warfarin and IV anticogaulation Indication: atrial fibrillation  Allergies  Allergen Reactions  . 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03* 1.07* 0.69  --   --   --   CREATININE 3.56*  --   --  3.86* 3.86*  --     Estimated Creatinine Clearance: 24.1 mL/min (A) (by C-G formula based on SCr of 3.86 mg/dL (H)).   Medical History: Past Medical History:  Diagnosis Date  . CAD (coronary artery disease)    a. Initial nonobst by cath 2009. b. Cath 01/2013 in setting of VT storm: obstructive distal Cx disease (small and terminates in the AV groove, unlikely to cause significant ischemia or electrical instability), nonobstructive RCA disease, EF 15-20%.   . Cerebrovascular accident [facility])    a. 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 Chronic systolic CHF (congestive heart failure) ([place])    a. Likely NICM (out of proportion to CAD). b. 2009 - EF 25-30% by echo, 01/2013: 15-20% by cath. c. 11/2014 Echo: EF 35-40%, Gr1 DD, mild MR, mod TR, PASP 20mmHg.  . CKD (chronic kidney disease), stage II   . Dyslipidemia   . Gout   . HTN (hypertension)   . Hypokalemia   . ICD (implantable cardiac defibrillator) in place   . Insulin dependent diabetes mellitus   . Lipoma   . Nonischemic cardiomyopathy ([place])    a.   11/2014 Echo: EF 35-40%, Gr1 DD, mild MR, mod TR, PASP 27mmHg.  . OSA (obstructive sleep apnea)    does not wear cpap  . PAF (paroxysmal atrial fibrillation) ([place])    a. Noted 05/2008 by EKG;  b. CHA2DS2VASc = 5-6-->coumadin.  . Paroxysmal VT ([place])    a. s/p St. Jude ICD 2007. b. H/o paroxysmal VT/VF including VT storm 12/2012 admission prompting amio initiation;  c. 01/2013 ICD upgrade SJM 1411-36Q Ellipse VR single lead ICD.  [name] Pulmonary HTN ([place])    a. Mild by cath 01/2013.    Medications:  Scheduled:  . amiodarone  200 mg Oral BID  . atorvastatin  80 mg Oral q1800  . Chlorhexidine Gluconate Cloth  6 each Topical Daily  . Chlorhexidine Gluconate Cloth  6 each Topical Q0600  . midodrine  10 mg Oral TID WC  . sildenafil  20 mg Oral BID  . sodium chloride flush  10 mL Intravenous Q12H  . sodium chloride flush  3 mL Intravenous Q12H   Infusions:  . sodium chloride    . sodium chloride      Assessment: Pt is a 63 y/o male with PMH of CHF (EF 20-25%),  prior VT s/p St Jude ICD, CAD, CVA, OSA on CPAP, pulmonary HTN, HTN, DM, CKDs stage III, and PAF on coumadin PTA (5mg  daily, except 2.5 mg on fridays). Pharmacy has been consulted to dose warfarin s/p HD catheter placed 10/23.  INR today still sub-therapeutic 1.9.  No overt bleeding or complications noted - but oozing from HD cath site over weekend and last pm. H/H stable  PLTC fell overnight 160 > 60 recheck 60 - will stop all heparin and send HIT antibody  Has received heparin drip for low INR and heparin in HD this past week.   Warfarin on hold for HD graft/fistula placement Will need DTI if IV anticoagulation needed    Goal of Therapy:  HL 0.3-0.7 INR 2-3 Monitor platelets by anticoagulation protocol: Yes   Plan:  -hold warfarin for fistula placement - f/u restart and  need forIV AC F/u HIT antibody result -Daily INR and CBC and HL -Monitor signs of bleeding    Bonnita Nasuti Pharm.D. CPP, BCPS Clinical  Pharmacist [phone] 02/28/2019 10:58 AM

## 2019-02-28 NOTE — Progress Notes (Signed)
[name] ROUNDING NOTE   Subjective:   63 year old gentleman with history of cardiomyopathy and advanced chronic kidney disease felt acute on chronic kidney insufficiency secondary to suspected cardiorenal syndrome.  Patient appears to be agreeable for AV fistula placement and is now deemed end-stage renal disease.  Is a history of acute on chronic systolic heart failure with biventricular dysfunction cardiac catheterization showed nonobstructive coronary artery disease left heart catheterization 8/20.  It was not felt that he had cardiac amyloid.  Status post St. Jude's ICD.  Echo showed ejection fraction 20 to 25% July 2020.  He had poor response to high-dose IV diuretics and was initiated on hemodialysis.  Hemodialysis started 02/24/2019.  Blood pressure 93/62 pulse 81 temperature 98.2 O2 sats 97% 2 L nasal cannula.  Ultrafiltration 2.7 L 02/27/2019 Sodium 134 potassium 3.8 chloride 94 CO2 26 BUN 26 creatinine 3.86 glucose 113 calcium 9.3 magnesium 1.8 WBC 9.0 hemoglobin 13.6 platelets 64  Amiodarone 200 mg twice daily atorvastatin 80 mg daily, Zaroxolyn 10 mg daily, midodrine 10 mg 3 times daily, Revatio 20 mg twice daily, Demadex 100 mg twice daily   Objective:  Vital signs in last 24 hours:  Temp:  [98 F (36.7 C)-98.2 F (36.8 C)] 98.2 F (36.8 C) (10/27 0730) Pulse Rate:  [77-92] 77 (10/27 0730) Resp:  [18-19] 19 (10/26 1109) BP: (94-114)/(51-70) 94/52 (10/27 0800) SpO2:  [97 %-98 %] 97 % (10/27 0730) Weight:  [105.3 kg] 105.3 kg (10/27 0730)  Weight change:  Filed Weights   02/27/19 0353 02/27/19 0700 02/28/19 0730  Weight: 107.5 kg 108.6 kg 105.3 kg    Intake/Output: I/O last 3 completed shifts: In: 76 [P.O.:720; I.V.:10] Out: 4627 [Urine:1325; Other:2751]   Intake/Output this shift:  No intake/output data recorded.  General:  AAOx3 NAD HEENT: MMM [name] AT anicteric sclera Neck:  No JVD, no adenopathy CV:  Heart RRR  Lungs:  L/S CTA  bilaterally Abd:  abd SNT/ND with normal BS GU:  Bladder non-palpable Extremities: +3 bilateral lower extremity edema Skin:  No skin rash   Basic Metabolic Panel: Recent Labs  Lab 02/24/19 0526 02/25/19 0452 02/26/19 0620 02/27/19 0500 02/28/19 0500  NA 140 142 139 137 134*  K 3.3* 4.0 4.8 3.8 3.8  CL 95* 100 100 95* 94*  CO2 32 30 28 29 26   GLUCOSE 97 131* 105* 91 113*  BUN 75* 50* 33* 40* 26*  CREATININE 4.53* 3.74* 3.56* 3.86* 3.86*  CALCIUM 9.5 9.0 9.4 9.6 9.3  MG 2.3 2.2 2.0 1.9 1.8    Liver Function Tests: No results for input(s): AST, ALT, ALKPHOS, BILITOT, PROT, ALBUMIN in the last 168 hours. No results for input(s): LIPASE, AMYLASE in the last 168 hours. No results for input(s): AMMONIA in the last 168 hours.  CBC: Recent Labs  Lab 02/24/19 0526 02/25/19 0452 02/26/19 0620 02/27/19 0500 02/28/19 0500  WBC 6.9 7.8 9.5 8.3 9.0  HGB 12.5* 13.0 13.8 13.6 13.6  HCT 37.6* 40.6 42.2 41.7 42.9  MCV 96.2 97.6 97.9 97.2 98.8  PLT 212 196 155 158 64*    Cardiac Enzymes: No results for input(s): CKTOTAL, CKMB, CKMBINDEX, TROPONINI in the last 168 hours.  BNP: Invalid input(s): POCBNP  CBG: Recent Labs  Lab 02/27/19 0636 02/27/19 1223 02/27/19 1611 02/27/19 2142 02/28/19 0813  GLUCAP 99 113* 130* 159* 107*    Microbiology: Results for orders placed or performed during the hospital encounter of 02/13/19  SARS CORONAVIRUS 2 (TAT 6-24 HRS) Nasopharyngeal Nasopharyngeal Swab  Status: None   Collection Time: 02/13/19  1:34 PM   Specimen: Nasopharyngeal Swab  Result Value Ref Range Status   SARS Coronavirus 2 NEGATIVE NEGATIVE Final    Comment: (NOTE) SARS-CoV-2 target nucleic acids are NOT DETECTED. The SARS-CoV-2 RNA is generally detectable in upper and lower respiratory specimens during the acute phase of infection. Negative results do not preclude SARS-CoV-2 infection, do not rule out co-infections with other pathogens, and should not be used as  the sole basis for treatment or other patient management decisions. Negative results must be combined with clinical observations, patient history, and epidemiological information. The expected result is Negative. Fact Sheet for Patients: SugarRoll.be Fact Sheet for Healthcare Providers: https://www.woods-mathews.com/ This test is not yet approved or cleared by the [place] FDA and  has been authorized for detection and/or diagnosis of SARS-CoV-2 by FDA under an Emergency Use Authorization (EUA). This EUA will remain  in effect (meaning this test can be used) for the duration of the COVID-19 declaration under Section 56 4(b)(1) of the Act, 21 U.S.C. section 360bbb-3(b)(1), unless the authorization is terminated or revoked sooner. Performed at [facility], [place] [address], [place], [place] [address]     Coagulation Studies: Recent Labs    02/26/19 0620 02/27/19 0500 02/28/19 0500  LABPROT 20.9* 20.2* 21.9*  INR 1.8* 1.7* 1.9*    Urinalysis: No results for input(s): COLORURINE, LABSPEC, PHURINE, GLUCOSEU, HGBUR, BILIRUBINUR, KETONESUR, PROTEINUR, UROBILINOGEN, NITRITE, LEUKOCYTESUR in the last 72 hours.  Invalid input(s): APPERANCEUR    Imaging: No results found.   Medications:   . sodium chloride     . amiodarone  200 mg Oral BID  . atorvastatin  80 mg Oral q1800  . Chlorhexidine Gluconate Cloth  6 each Topical Daily  . metolazone  10 mg Oral Daily  . midodrine  10 mg Oral TID WC  . sildenafil  20 mg Oral BID  . sodium chloride flush  10 mL Intravenous Q12H  . torsemide  100 mg Oral BID   sodium chloride, acetaminophen, loperamide, ondansetron (ZOFRAN) IV, sodium chloride flush  Assessment/ Plan:   Chronic kidney disease stage IV baseline serum creatinine high twos.  Acute on chronic kidney disease initiated dialysis treatment third treatment was 02/27/2019.  Clip process [facility].  Appreciate  assistance from Dr. Juanda [facility].  Next dialysis treatment will be 03/01/2019 Now deemed to be end-stage renal disease.  Would discontinue diuretics metolazone and torsemide.  Bleeding noted from dialysis catheter site pursestring suture placed per interventional radiology 02/28/2019  Hypertension/volume continue to ultrafilter with dialysis.  Chronic hypotension being managed with midodrine.  Metabolic bone disease phosphorus remained stable at this time.  Will order PTH.  Anemia does not appear to be an issue at this point.  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

## 2019-02-28 NOTE — Progress Notes (Addendum)
Patient ID: [name], male   DOB: Feb 24, 1956, 63 y.o.   MRN: [id number]     Advanced Heart Failure Rounding Note  PCP-Cardiologist: Minus Breeding, MD  [facility]: Dr. Haroldine Laws   Subjective:    Had HD yesterday. Tolerated well. Weight down over 40 pounds. Feels much better. Feels like he can get around on his own now.   Denies CP, SOB, orthopnea or PND. Still with some oozing from [facility]. Off warfarin and heparin. INR 1.9.   VVS seeing for HD access.    Objective:   Weight Range: 105.3 kg Body mass index is 33.3 kg/m.   Vital Signs:   Temp:  [98 F (36.7 C)-98.2 F (36.8 C)] 98.2 F (36.8 C) (10/27 0730) Pulse Rate:  [77-92] 77 (10/27 0730) Resp:  [18-19] 19 (10/26 1109) BP: (94-114)/(51-70) 94/52 (10/27 0800) SpO2:  [97 %-98 %] 97 % (10/27 0730) Weight:  [105.3 kg] 105.3 kg (10/27 0730) Last BM Date: 02/24/19  Weight change: Filed Weights   02/27/19 0353 02/27/19 0700 02/28/19 0730  Weight: 107.5 kg 108.6 kg 105.3 kg    Intake/Output:   Intake/Output Summary (Last 24 hours) at 02/28/2019 0941 Last data filed at 02/28/2019 0500 Gross per 24 hour  Intake 360 ml  Output 2926 ml  Net -2566 ml      Physical Exam   General:  Lying in bed. No resp difficulty HEENT: normal Neck: supple. RIJ TLC. R chest TDC with pressure dressing Carotids 2+ bilat; no bruits. No lymphadenopathy or thryomegaly appreciated. Cor: PMI nondisplaced. Regular rate & rhythm. No rubs, gallops or murmurs. Lungs: clear Abdomen: soft, nontender, nondistended. No hepatosplenomegaly. No bruits or masses. Good bowel sounds. Extremities: no cyanosis, clubbing, rash, 1+ edema Neuro: alert & orientedx3, cranial nerves grossly intact. moves all 4 extremities w/o difficulty. Affect pleasant   Telemetry   SR 70-80s Personally reviewed   Labs    CBC Recent Labs    02/27/19 0500 02/28/19 0500  WBC 8.3 9.0  HGB 13.6 13.6  HCT 41.7 42.9  MCV 97.2 98.8  PLT 158 64*   Basic Metabolic  Panel Recent Labs    02/27/19 0500 02/28/19 0500  NA 137 134*  K 3.8 3.8  CL 95* 94*  CO2 29 26  GLUCOSE 91 113*  BUN 40* 26*  CREATININE 3.86* 3.86*  CALCIUM 9.6 9.3  MG 1.9 1.8   Liver Function Tests No results for input(s): AST, ALT, ALKPHOS, BILITOT, PROT, ALBUMIN in the last 72 hours. No results for input(s): LIPASE, AMYLASE in the last 72 hours. Cardiac Enzymes No results for input(s): CKTOTAL, CKMB, CKMBINDEX, TROPONINI in the last 72 hours.  BNP: BNP (last 3 results) Recent Labs    07/19/18 1106 01/02/19 1552 02/13/19 1114  BNP 1,608.5* 2,154.7* 2,637.3*    ProBNP (last 3 results) Recent Labs    11/01/18 1229  PROBNP 2,158.0*     D-Dimer No results for input(s): DDIMER in the last 72 hours. Hemoglobin A1C No results for input(s): HGBA1C in the last 72 hours. Fasting Lipid Panel No results for input(s): CHOL, HDL, LDLCALC, TRIG, CHOLHDL, LDLDIRECT in the last 72 hours. Thyroid Function Tests No results for input(s): TSH, T4TOTAL, T3FREE, THYROIDAB in the last 72 hours.  Invalid input(s): FREET3  Other results:   Imaging    No results found.   Medications:     Scheduled Medications: . amiodarone  200 mg Oral BID  . atorvastatin  80 mg Oral q1800  . Chlorhexidine Gluconate Cloth  6 each Topical Daily  . Chlorhexidine Gluconate Cloth  6 each Topical Q0600  . midodrine  10 mg Oral TID WC  . sildenafil  20 mg Oral BID  . sodium chloride flush  10 mL Intravenous Q12H    Infusions: . sodium chloride      PRN Medications: sodium chloride, acetaminophen, loperamide, ondansetron (ZOFRAN) IV, sodium chloride flush    Patient Profile   63 y/o male with severe systolic HF with biventricular dysfunction due to NICM and advanced CKD. Recently hospitalized with low output HF. Now returns with progressive NYHA IV symptoms with marked fluid overload not responding to titration of oral diuretic regimen. With CKD 4, biventricular HF and morbid  obesity advanced options are quite limited.  Assessment/Plan    1. Acute on Chronic Systolic HF with biventricular dysfunction due to NICM: mild nonobstructive CAD on [facility] 12/2018. CM out of proportion of CAD. PYP scan and myeloma panel completed and were not suggestive of cardiac amyloidosis. S/p St Jude ICD. ECHO 07/28/2017 EF 20-25%. RV mildly dilated. Echo 11/17/18 EF 20-25% RV dilated and severely HK with septal flattening. Presented to ED 10/12 w/ NYHA IV symptoms and markedly volume overload w/ 3+ bilateral LEE, left basilar rales and elevated JVD to level of ear. Admit Wt up 10+ lb from home baseline. BNP 2600 on admit.  - Poor initial response to high dose IV diuretics w/ increase in SCr from  2.76>>3.30, prompting initiation of inotropic support, started on milrinone. Milrinone later stopped due to ventricular ectopy. HD started on 10/23 - Volume status much improved.  - Will need permanent HD access and long-term HD  2. Acute on CKD stage 3 - Now s/p HD catheter placment.Started HD on 10/23.  - Nephrology appreciated - Will need permanent HD access and long-term HD - VVS consulted for AVF. Vein mapping pending - Will need to arrange for outpatient HD - Still oozing from tunneled HD cath. May need a stitch. IR following  3. CAD - LHC8/20with LAD 20%50% distal left circ and 20% mid RCA stenosis. No significant disease in left main or LAD. - No s/s ischemia. Hs Trop 284 but likely demand ischemia in the setting of a/c CHF and abnormal renal function.   - Continue statin. - Hold ? blocker due to soft BP/ concern for low output - No ASA due to chronic coumadin  4. PAF  -s/p recent DC-CV for AFL.  - Continue amio 200 mg bid (had recent VT), he is in NSR.    - Coumadin held for [facility]. INR 1.9. Will likely restart heparin in am. If TDC site oozing can place stitch. Discussed dosing with PharmD personally.   6. VT -Has St Jude ICD.  -Had VT episode (rate 190 bpm) w/ delivery of  shock on 02/07/19  -Started on IV amiodarone 10/14 w/ improvement, NSVT less frequent off milrinone.  - Continue amiodarone 200 mg po twice a day.  -Keep K >4.0 and Mg > 2.0 .  7. Pulmonary HTN - On home O2 (2L).  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 Had negative V/Q scan ruling out CTEPH.  - RHC PVR 6.5. Started on sildenafil 20 mg three times a day.  - Off sildenafil due to low BP and need to preserve renal perfusion  8. Hypokalemia: K 3.8 today   9. Thrombocytopenia - PLTs down to 64k. Need to follow. Repeat CBC.   10. Deconditioning - PT/CR to see   Length  of Stay: [place], MD  02/28/2019, 9:41 AM  Advanced Heart Failure Team Pager [phone] (M-F; 7a - 4p)  Please contact [facility] for night-coverage after hours (4p -7a ) and weekends on [email or web address]

## 2019-02-28 NOTE — Progress Notes (Signed)
Right arm access and tunneled catheter planned for Dr Donzetta Matters on Thursday based on vein map that is pending.  Please continue to hold warfarin.  Currently his INR is rising.  Will need to be less than 1.7 on Thursday morning to proceed.  Ruta Hinds, MD [facility] Office: [phone]

## 2019-02-28 NOTE — Progress Notes (Signed)
Physical Therapy Treatment Patient Details Name: [name] MRN: [id number] DOB: 1955-08-13 Today's Date: 02/28/2019    History of Present Illness [name] is a 63 y.o. male with history of longstanding cardiomyopathy out of proportion to CAD/chronic biventricular CHF (EF 20-25% 07/2017), prior VT, s/p St Jude ICD, CAD, CVA, OSA on CPAP, pulmonary HTN on O2 at home, PAF on coumadin, HTN, DM, and CKD stage 3.    PT Comments    Patient progressing well this session despite this am needing to remain in bed due to IJ tunneled cath bleeding, limited R UE use and carried his telemetry with ambulation to avoid stressing the area.  Feel pt remains appropriate for home with follow up HHPT at d/c.  Need hallway ambulation daily with nursing as well.  PT to follow.    Follow Up Recommendations  Home health PT;Supervision/Assistance - 24 hour     Equipment Recommendations  Rolling walker with 5" wheels    Recommendations for Other Services       Precautions / Restrictions Precautions Precautions: Fall    Mobility  Bed Mobility   Bed Mobility: Sit to Supine     Supine to sit: Modified independent (Device/Increase time) Sit to supine: Min assist   General bed mobility comments: assist to bring 1 leg onto bed to supine  Transfers Overall transfer level: Needs assistance Equipment used: None Transfers: Sit to/from Stand Sit to Stand: Supervision         General transfer comment: stood without device from EOB  Ambulation/Gait Ambulation/Gait assistance: Supervision Gait Distance (Feet): 300 Feet Assistive device: Rolling walker (2 wheeled) Gait Pattern/deviations: Step-through pattern;Decreased stride length;Decreased dorsiflexion - left;Decreased dorsiflexion - right     General Gait Details: on 2L O2 SpO2 95% with ambulation, only minimal dyspnea noted, slow pace and pt attempting to speed up near end of walker, noted edema in feet limiting stride  length   Stairs             Wheelchair Mobility    Modified Rankin (Stroke Patients Only)       Balance Overall balance assessment: Needs assistance   Sitting balance-Leahy Scale: Good       Standing balance-Leahy Scale: Fair Standing balance comment: able to stand without walker, needed walker for ambulation                            Cognition   Behavior During Therapy: WFL for tasks assessed/performed Overall Cognitive Status: Within Functional Limits for tasks assessed                                        Exercises      General Comments        Pertinent Vitals/Pain Pain Assessment: Faces Faces Pain Scale: Hurts a little bit Pain Location: feet due to swelling Pain Descriptors / Indicators: Tightness Pain Intervention(s): Monitored during session;Repositioned    Home Living                      Prior Function            PT Goals (current goals can now be found in the care plan section) Progress towards PT goals: Progressing toward goals    Frequency    Min 3X/week      PT Plan Current plan remains  appropriate    Co-evaluation              AM-PAC PT "6 Clicks" Mobility   Outcome Measure  Help needed turning from your back to your side while in a flat bed without using bedrails?: None Help needed moving from lying on your back to sitting on the side of a flat bed without using bedrails?: A Little Help needed moving to and from a bed to a chair (including a wheelchair)?: None Help needed standing up from a chair using your arms (e.g., wheelchair or bedside chair)?: None Help needed to walk in hospital room?: A Little Help needed climbing 3-5 steps with a railing? : A Little 6 Click Score: 21    End of Session Equipment Utilized During Treatment: Oxygen Activity Tolerance: Patient tolerated treatment well Patient left: in bed;with call bell/phone within reach   PT Visit Diagnosis:  Unsteadiness on feet (R26.81);Other abnormalities of gait and mobility (R26.89);Muscle weakness (generalized) (M62.81)     Time: 6429-0379 PT Time Calculation (min) (ACUTE ONLY): 31 min  Charges:  $Gait Training: 23-37 mins                     Magda Kiel, [place] [facility] [phone] 02/28/2019    [name] 02/28/2019, 1:35 PM

## 2019-02-28 NOTE — Progress Notes (Signed)
Patient ID: [name], male   DOB: [birth year], 63 y.o.   MRN: [id number]   Evaluation of bleeding Rt IJ tunneled HD catheter placed in IR 02/24/19.  Pt maybe at head of bed 45 degrees up. Pressure dressing in place  Removed all of dressings and fluid bag. No bleeding noted  Moved catheter some to evaluate it fully No bleeding  Replaced dressing to site Replaced fluid bag for additional pressure.  RN to page me if any further bleeding noted  Will place purse string suture in IR if needed.

## 2019-02-28 NOTE — Progress Notes (Signed)
OT Cancellation Note  Patient Details Name: [name] MRN: [id number] DOB: 1956/02/07   Cancelled Treatment:    Reason Eval/Treat Not Completed: Patient declined, no reason specified Pt declined, stating he was tired and wanting to rest. Pt requested OT return Thursday as he has HD Wednesday and "it takes a lot out of me". Will continue to follow pt as time allows and progress pt as appropriate.  Dorinda Hill OTR/L Acute Rehabilitation Services Office: [place] 02/28/2019, 4:43 PM

## 2019-03-01 ENCOUNTER — Encounter (HOSPITAL_COMMUNITY): Payer: Medicare Other

## 2019-03-01 DIAGNOSIS — I5043 Acute on chronic combined systolic (congestive) and diastolic (congestive) heart failure: Secondary | ICD-10-CM | POA: Diagnosis not present

## 2019-03-01 LAB — CBC
HCT: 42.4 % (ref 39.0–52.0)
Hemoglobin: 13.4 g/dL (ref 13.0–17.0)
MCH: 31.1 pg (ref 26.0–34.0)
MCHC: 31.6 g/dL (ref 30.0–36.0)
MCV: 98.4 fL (ref 80.0–100.0)
Platelets: 76 10*3/uL — ABNORMAL LOW (ref 150–400)
RBC: 4.31 MIL/uL (ref 4.22–5.81)
RDW: 15.5 % (ref 11.5–15.5)
WBC: 8.6 10*3/uL (ref 4.0–10.5)
nRBC: 0 % (ref 0.0–0.2)

## 2019-03-01 LAB — BASIC METABOLIC PANEL
Anion gap: 14 (ref 5–15)
BUN: 38 mg/dL — ABNORMAL HIGH (ref 8–23)
CO2: 26 mmol/L (ref 22–32)
Calcium: 9.3 mg/dL (ref 8.9–10.3)
Chloride: 95 mmol/L — ABNORMAL LOW (ref 98–111)
Creatinine, Ser: 4.87 mg/dL — ABNORMAL HIGH (ref 0.61–1.24)
GFR calc Af Amer: 14 mL/min — ABNORMAL LOW (ref >60)
GFR calc non Af Amer: 12 mL/min — ABNORMAL LOW (ref >60)
Glucose, Bld: 105 mg/dL — ABNORMAL HIGH (ref 70–99)
Potassium: 3.7 mmol/L (ref 3.5–5.1)
Sodium: 135 mmol/L (ref 135–145)

## 2019-03-01 LAB — PARATHYROID HORMONE, INTACT (NO CA): PTH: 141 pg/mL — ABNORMAL HIGH (ref 15–65)

## 2019-03-01 LAB — GLUCOSE, CAPILLARY
Glucose-Capillary: 101 mg/dL — ABNORMAL HIGH (ref 70–99)
Glucose-Capillary: 83 mg/dL (ref 70–99)
Glucose-Capillary: 143 mg/dL — ABNORMAL HIGH (ref 70–99)
Glucose-Capillary: 111 mg/dL — ABNORMAL HIGH (ref 70–99)

## 2019-03-01 LAB — MAGNESIUM: Magnesium: 1.8 mg/dL (ref 1.7–2.4)

## 2019-03-01 LAB — HEPARIN INDUCED PLATELET AB (HIT ANTIBODY): Heparin Induced Plt Ab: 0.082 OD (ref 0.000–0.400)

## 2019-03-01 LAB — PROTIME-INR
INR: 1.9 — ABNORMAL HIGH (ref 0.8–1.2)
Prothrombin Time: 21.3 seconds — ABNORMAL HIGH (ref 11.4–15.2)

## 2019-03-01 MED ORDER — SODIUM CHLORIDE 0.9 % IV SOLN
1.5000 g | INTRAVENOUS | Status: AC
  Administered 2019-03-02: 1.5 g via INTRAVENOUS
  Filled 2019-03-01 (×2): qty 1.5

## 2019-03-01 MED ORDER — CHLORPROMAZINE HCL 25 MG PO TABS
25.0000 mg | ORAL_TABLET | Freq: Three times a day (TID) | ORAL | Status: DC
  Administered 2019-03-01 – 2019-03-03 (×4): 25 mg via ORAL
  Filled 2019-03-01 (×8): qty 1

## 2019-03-01 MED ORDER — ALTEPLASE 2 MG IJ SOLR
INTRAMUSCULAR | Status: AC
  Administered 2019-03-01: 4 mg
  Filled 2019-03-01: qty 4

## 2019-03-01 MED ORDER — ALTEPLASE 2 MG IJ SOLR
INTRAMUSCULAR | Status: AC
  Administered 2019-03-01: 4 mg via INTRAVENOUS
  Filled 2019-03-01: qty 4

## 2019-03-01 NOTE — Progress Notes (Addendum)
Patient ID: [name], male   DOB: January 04, 1956, 63 y.o.   MRN: [id number]     Advanced Heart Failure Rounding Note  PCP-Cardiologist: Minus Breeding, MD  [facility]: Dr. Haroldine Laws   Subjective:    Had HD today. Weight down over 40 pounds.   Complaining of hiccups. + orthopnea. For AVF tomorrow. INR 1.9. No further bleeding from [facility].   Objective:   Weight Range: 104.6 kg Body mass index is 33.09 kg/m.   Vital Signs:   Temp:  [97.7 F (36.5 C)-98.4 F (36.9 C)] 98.4 F (36.9 C) (10/28 1331) Pulse Rate:  [64-81] 72 (10/28 1206) Resp:  [15-27] 16 (10/28 1331) BP: (95-127)/(51-82) 106/82 (10/28 1331) SpO2:  [94 %-100 %] 94 % (10/28 1331) Weight:  [104.6 kg] 104.6 kg (10/28 0651) Last BM Date: 02/27/19  Weight change: Filed Weights   02/27/19 0700 02/28/19 0730 03/01/19 0651  Weight: 108.6 kg 105.3 kg 104.6 kg    Intake/Output:   Intake/Output Summary (Last 24 hours) at 03/01/2019 1357 Last data filed at 03/01/2019 1331 Gross per 24 hour  Intake 120 ml  Output 1225 ml  Net -1105 ml      Physical Exam   General: Sitting on the side of the bed. No resp difficulty HEENT: normal. Anicteric  Neck: supple. JVP 7-8  Carotids 2+ bilat; no bruits. No lymphadenopathy or thryomegaly appreciated. Cor: PMI nondisplaced. Regular rate & rhythm. No rubs, gallops or murmurs. RHD cath and R TLC. No bleeding Lungs: clear no wheeze Abdomen: soft, nontender, nondistended. No hepatosplenomegaly. No bruits or masses. Good bowel sounds. Extremities: no cyanosis, clubbing, rash, R and LLE unna boots edema Neuro: alert & oriented x 3, cranial nerves grossly intact. moves all 4 extremities w/o difficulty. Affect pleasant   Telemetry   Sr 70-80s with PVCs personally reviewed.    Labs    CBC Recent Labs    02/28/19 0925 03/01/19 0500  WBC 8.9 8.6  HGB 13.7 13.4  HCT 42.0 42.4  MCV 98.4 98.4  PLT 62* 76*   Basic Metabolic Panel Recent Labs    02/28/19 0500 03/01/19 0500   NA 134* 135  K 3.8 3.7  CL 94* 95*  CO2 26 26  GLUCOSE 113* 105*  BUN 26* 38*  CREATININE 3.86* 4.87*  CALCIUM 9.3 9.3  MG 1.8 1.8   Liver Function Tests No results for input(s): AST, ALT, ALKPHOS, BILITOT, PROT, ALBUMIN in the last 72 hours. No results for input(s): LIPASE, AMYLASE in the last 72 hours. Cardiac Enzymes No results for input(s): CKTOTAL, CKMB, CKMBINDEX, TROPONINI in the last 72 hours.  BNP: BNP (last 3 results) Recent Labs    07/19/18 1106 01/02/19 1552 02/13/19 1114  BNP 1,608.5* 2,154.7* 2,637.3*    ProBNP (last 3 results) Recent Labs    11/01/18 1229  PROBNP 2,158.0*     D-Dimer No results for input(s): DDIMER in the last 72 hours. Hemoglobin A1C No results for input(s): HGBA1C in the last 72 hours. Fasting Lipid Panel No results for input(s): CHOL, HDL, LDLCALC, TRIG, CHOLHDL, LDLDIRECT in the last 72 hours. Thyroid Function Tests No results for input(s): TSH, T4TOTAL, T3FREE, THYROIDAB in the last 72 hours.  Invalid input(s): FREET3  Other results:   Imaging    No results found.   Medications:     Scheduled Medications: . amiodarone  200 mg Oral BID  . atorvastatin  80 mg Oral q1800  . Chlorhexidine Gluconate Cloth  6 each Topical Daily  . Chlorhexidine Gluconate  Cloth  6 each Topical [id number]  . midodrine  10 mg Oral TID WC  . sildenafil  20 mg Oral BID  . sodium chloride flush  10 mL Intravenous Q12H  . sodium chloride flush  3 mL Intravenous Q12H    Infusions: . sodium chloride    . sodium chloride    . [START ON 03/02/2019] cefUROXime (ZINACEF)  IV      PRN Medications: sodium chloride, sodium chloride, acetaminophen, loperamide, ondansetron (ZOFRAN) IV, sodium chloride flush, sodium chloride flush    Patient Profile   63 y/o male with severe systolic HF with biventricular dysfunction due to NICM and advanced CKD. Recently hospitalized with low output HF. Now returns with progressive NYHA IV symptoms with marked  fluid overload not responding to titration of oral diuretic regimen. With CKD 4, biventricular HF and morbid obesity advanced options are quite limited.  Assessment/Plan    1. Acute on Chronic Systolic HF with biventricular dysfunction due to NICM: mild nonobstructive CAD on [facility] 12/2018. CM out of proportion of CAD. PYP scan and myeloma panel completed and were not suggestive of cardiac amyloidosis. S/p St Jude ICD. ECHO 07/28/2017 EF 20-25%. RV mildly dilated. Echo 11/17/18 EF 20-25% RV dilated and severely HK with septal flattening. Presented to ED 10/12 w/ NYHA IV symptoms and markedly volume overload w/ 3+ bilateral LEE, left basilar rales and elevated JVD to level of ear. Admit Wt up 10+ lb from home baseline. BNP 2600 on admit.  - Poor initial response to high dose IV diuretics w/ increase in SCr from  2.76>>3.30, prompting initiation of inotropic support, started on milrinone. Milrinone later stopped due to ventricular ectopy. HD started on 10/23 - Volume status managed with HD.   2. Acute on CKD stage 3 - Now s/p HD catheter placment.Started HD on 10/23. Tolerating well.  - Nephrology appreciated - Will need permanent HD access and long-term HD.  - VVS consulted for AVF. Vein mapping completed. Plan for AVF tomorrow.  - Will need to arrange for outpatient HD  3. CAD - LHC8/20with LAD 20%50% distal left circ and 20% mid RCA stenosis. No significant disease in left main or LAD. -  Hs Trop 284 but likely demand ischemia in the setting of a/c CHF and abnormal renal function.   - Continue statin. - Hold ? blocker due to soft BP/ concern for low output - No ASA due to chronic coumadin  4. PAF  -s/p recent DC-CV for AFL.  - Continue amio 200 mg bid (had recent VT), he is in NSR.    - Coumadin held for AVF. INR 1.9.   6. VT -Has St Jude ICD.  -Had VT episode (rate 190 bpm) w/ delivery of shock on 02/07/19  -Started on IV amiodarone 10/14 w/ improvement, NSVT less frequent off  milrinone.  - Continue amiodarone 200 mg po twice a day.  -Keep K >4.0 and Mg > 2.0 .  7. Pulmonary HTN - On home O2 (2L).  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 Had negative V/Q scan ruling out CTEPH.  - RHC PVR 6.5. Started on sildenafil 20 mg three times a day.  - Off sildenafil due to low BP and need to preserve renal perfusion  8. Hypokalemia: K 3.7 today   9. Thrombocytopenia - PLTs down to 76.  - HIT pending    10. Deconditioning - PT/CR to see   Length of Stay: [place], NP  03/01/2019,  1:57 PM  Advanced Heart Failure Team Pager [phone] (M-F; 7a - 4p)  Please contact [facility] for night-coverage after hours (4p -7a ) and weekends on [email or web address]  Patient seen and examined with the above-signed Advanced Practice Provider and/or Housestaff. I personally reviewed laboratory data, imaging studies and relevant notes. I independently examined the patient and formulated the important aspects of the plan. I have edited the note to reflect any of my changes or salient points. I have personally discussed the plan with the patient and/or family.  Volume status much improved with HD. Planning for AVF tomorrow. INR 1.9. Will continue to hold coumadin for now. Platelets have been falling. Will check HIT. If positive for HIT will need DTI. Wll try thorazine for hiccups. Discussed dosing with PharmD personally.  Glori Bickers, MD  6:48 PM

## 2019-03-01 NOTE — Progress Notes (Addendum)
    INR 1.9 Coumadin being held. Plan for Right arm access and TDC if INR 1.7 or less Thursday NPO past MN with labs scheduled for INR in the AM.  Roxy Horseman PA-C  I reviewed patient's vein mapping appears to have marginal cephalic vein likely create fistula with basilic vein which would require 2 operations most likely.  If no vein suitable in the operating room we will plan for right upper extremity AV grafting.  Patient had tunneled dialysis catheter placed 10/23 which is working for dialysis this morning so will not need exchange tomorrow.  Honest Safranek C. Donzetta Matters, MD [facility] Office: [phone] Pager: [phone]

## 2019-03-01 NOTE — Progress Notes (Signed)
ANTICOAGULATION CONSULT NOTE - Follow Up Consult  Pharmacy Consult for Warfarin and IV anticogaulation Indication: atrial fibrillation  Allergies  Allergen Reactions  . 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 HIT    Patient Measurements: Height: 5\' 10"  (177.8 cm) Weight: 230 lb 9.6 oz (104.6 kg) IBW/kg (Calculated) : 73  Heparin dosing weight: 101 kg  Vital Signs: Temp: 98 F (36.7 C) (10/28 0751) Temp Source: Oral (10/28 0751) BP: 100/58 (10/28 1200) Pulse Rate: 78 (10/28 1200)  Labs: Recent Labs    02/27/19 0500 02/28/19 0500 02/28/19 0925 03/01/19 0500  HGB 13.6 13.6 13.7 13.4  HCT 41.7 42.9 42.0 42.4  PLT 158 64* 62* 76*  LABPROT 20.2* 21.9*  --  21.3*  INR 1.7* 1.9*  --  1.9*  CREATININE 3.86* 3.86*  --  4.87*    Estimated Creatinine Clearance: 19 mL/min (A) (by C-G formula based on SCr of 4.87 mg/dL (H)).   Medical History: Past Medical History:  Diagnosis Date  . CAD (coronary artery disease)    a. Initial nonobst by cath 2009. b. Cath 01/2013 in setting of VT storm: obstructive distal Cx disease (small and terminates in the AV groove, unlikely to cause significant ischemia or electrical instability), nonobstructive RCA disease, EF 15-20%.   . Cerebrovascular accident [facility])    a. 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 Chronic systolic CHF (congestive heart failure) ([place])    a. Likely NICM (out of proportion to CAD). b. 2009 - EF 25-30% by echo, 01/2013: 15-20% by cath. c. 11/2014 Echo: EF 35-40%, Gr1 DD, mild MR, mod TR, PASP 51mmHg.  . CKD (chronic kidney disease), stage II   . Dyslipidemia   . Gout   . HTN (hypertension)   . Hypokalemia   . ICD (implantable cardiac defibrillator) in place   . Insulin dependent diabetes mellitus   . Lipoma   . Nonischemic cardiomyopathy ([place])    a.  11/2014 Echo: EF 35-40%, Gr1 DD, mild MR, mod TR, PASP 40mmHg.  . OSA (obstructive sleep apnea)    does not wear cpap  . PAF (paroxysmal  atrial fibrillation) ([place])    a. Noted 05/2008 by EKG;  b. CHA2DS2VASc = 5-6-->coumadin.  . Paroxysmal VT ([place])    a. s/p St. Jude ICD 2007. b. H/o paroxysmal VT/VF including VT storm 12/2012 admission prompting amio initiation;  c. 01/2013 ICD upgrade SJM [id number] Ellipse VR single lead ICD.  [name] Pulmonary HTN ([place])    a. Mild by cath 01/2013.    Medications:  Scheduled:  . alteplase      . amiodarone  200 mg Oral BID  . atorvastatin  80 mg Oral q1800  . Chlorhexidine Gluconate Cloth  6 each Topical Daily  . Chlorhexidine Gluconate Cloth  6 each Topical Q0600  . midodrine  10 mg Oral TID WC  . sildenafil  20 mg Oral BID  . sodium chloride flush  10 mL Intravenous Q12H  . sodium chloride flush  3 mL Intravenous Q12H   Infusions:  . sodium chloride    . sodium chloride    . [START ON 03/02/2019] cefUROXime (ZINACEF)  IV      Assessment: Pt is a 63 y/o male with PMH of CHF (EF 20-25%), prior VT s/p St Jude ICD, CAD, CVA, OSA on CPAP, pulmonary HTN, HTN, DM, CKDs stage III, and PAF on coumadin PTA (5mg  daily, except 2.5 mg on fridays). Pharmacy has been consulted to dose  warfarin s/p HD catheter placed 10/23.  INR today still sub-therapeutic 1.9.  No overt bleeding or complications noted - but oozing from HD cath site over weekend and early this week. H/H stable  PLTC fell from 150-200s to 60-70s - stopped all heparin and sent HIT antibody, still in process   Warfarin on hold for HD graft/fistula placement - Dr. Donzetta Matters to place on Thursday if INR < 1.7 Will need DTI if IV anticoagulation needed   Goal of Therapy:  INR 2-3 Monitor platelets by anticoagulation protocol: Yes   Plan:  -hold warfarin for fistula placement  -f/u warfarin restart and need for IV AC pending HIT ab -Daily INR and CBC -Monitor signs of bleeding   Vertis Kelch, PharmD PGY2 Cardiology Pharmacy Resident Phone [phone] 03/01/2019       12:51 PM  Please check [email or web address] for unit-specific pharmacist  phone numbers

## 2019-03-01 NOTE — Progress Notes (Signed)
[facility] ROUNDING NOTE   Subjective:   63 year old gentleman with history of cardiomyopathy and advanced chronic kidney disease felt acute on chronic kidney insufficiency secondary to suspected cardiorenal syndrome.  Patient appears to be agreeable for AV fistula placement and is now deemed end-stage renal disease.  Is a history of acute on chronic systolic heart failure with biventricular dysfunction cardiac catheterization showed nonobstructive coronary artery disease left heart catheterization 8/20.  It was not felt that he had cardiac amyloid.  Status post St. Jude's ICD.  Echo showed ejection fraction 20 to 25% July 2020.  He had poor response to high-dose IV diuretics and was initiated on hemodialysis.  Patient was seen on dialysis 03/01/2019.  He will be scheduled for right upper extremity AV graft later this week.  Initiated dialysis 02/24/2019  Blood pressure 140/58 pulse 64 temperature 98 O2 sats 98% 3 L nasal cannula  Sodium 134 potassium 3.7 chloride 95 CO2 26 glucose 105 BUN 38 creatinine 4.87 calcium 9.3 WBC 8.6 hemoglobin 13.4 platelets 76  Amiodarone 200 mg twice daily atorvastatin 80 mg daily,  , midodrine 10 mg 3 times daily, Revatio 20 mg twice daily,     Objective:  Vital signs in last 24 hours:  Temp:  [97.7 F (36.5 C)-98 F (36.7 C)] 98 F (36.7 C) (10/28 0751) Pulse Rate:  [64-77] 64 (10/28 0933) Resp:  [20-22] 20 (10/28 0933) BP: (99-127)/(51-81) 114/58 (10/28 0933) SpO2:  [98 %] 98 % (10/28 0751) Weight:  [104.6 kg] 104.6 kg (10/28 0651)  Weight change:  Filed Weights   02/27/19 0700 02/28/19 0730 03/01/19 0651  Weight: 108.6 kg 105.3 kg 104.6 kg    Intake/Output: I/O last 3 completed shifts: In: 82 [P.O.:480; I.V.:10] Out: 445 [Urine:445]   Intake/Output this shift:  No intake/output data recorded.  General:  AAOx3 NAD HEENT: MMM [place] AT anicteric sclera Neck:  No JVD, no adenopathy CV:  Heart RRR  Lungs:  L/S CTA  bilaterally Abd:  abd SNT/ND with normal BS GU:  Bladder non-palpable Extremities: +3 bilateral lower extremity edema Skin:  No skin rash   Basic Metabolic Panel: Recent Labs  Lab 02/25/19 0452 02/26/19 0620 02/27/19 0500 02/28/19 0500 03/01/19 0500  NA 142 139 137 134* 135  K 4.0 4.8 3.8 3.8 3.7  CL 100 100 95* 94* 95*  CO2 30 28 29 26 26   GLUCOSE 131* 105* 91 113* 105*  BUN 50* 33* 40* 26* 38*  CREATININE 3.74* 3.56* 3.86* 3.86* 4.87*  CALCIUM 9.0 9.4 9.6 9.3 9.3  MG 2.2 2.0 1.9 1.8 1.8    Liver Function Tests: No results for input(s): AST, ALT, ALKPHOS, BILITOT, PROT, ALBUMIN in the last 168 hours. No results for input(s): LIPASE, AMYLASE in the last 168 hours. No results for input(s): AMMONIA in the last 168 hours.  CBC: Recent Labs  Lab 02/26/19 0620 02/27/19 0500 02/28/19 0500 02/28/19 0925 03/01/19 0500  WBC 9.5 8.3 9.0 8.9 8.6  HGB 13.8 13.6 13.6 13.7 13.4  HCT 42.2 41.7 42.9 42.0 42.4  MCV 97.9 97.2 98.8 98.4 98.4  PLT 155 158 64* 62* 76*    Cardiac Enzymes: No results for input(s): CKTOTAL, CKMB, CKMBINDEX, TROPONINI in the last 168 hours.  BNP: Invalid input(s): POCBNP  CBG: Recent Labs  Lab 02/28/19 0813 02/28/19 1128 02/28/19 1615 02/28/19 2130 03/01/19 0711  GLUCAP 107* 102* 123* 109* 101*    Microbiology: Results for orders placed or performed during the hospital encounter of 02/13/19  SARS CORONAVIRUS  2 (TAT 6-24 HRS) Nasopharyngeal Nasopharyngeal Swab     Status: None   Collection Time: 02/13/19  1:34 PM   Specimen: Nasopharyngeal Swab  Result Value Ref Range Status   SARS Coronavirus 2 NEGATIVE NEGATIVE Final    Comment: (NOTE) SARS-CoV-2 target nucleic acids are NOT DETECTED. The SARS-CoV-2 RNA is generally detectable in upper and lower respiratory specimens during the acute phase of infection. Negative results do not preclude SARS-CoV-2 infection, do not rule out co-infections with other pathogens, and should not be used  as the sole basis for treatment or other patient management decisions. Negative results must be combined with clinical observations, patient history, and epidemiological information. The expected result is Negative. Fact Sheet for Patients: SugarRoll.be Fact Sheet for Healthcare Providers: https://www.woods-mathews.com/ This test is not yet approved or cleared by the [place] FDA and  has been authorized for detection and/or diagnosis of SARS-CoV-2 by FDA under an Emergency Use Authorization (EUA). This EUA will remain  in effect (meaning this test can be used) for the duration of the COVID-19 declaration under Section 56 4(b)(1) of the Act, 21 U.S.C. section 360bbb-3(b)(1), unless the authorization is terminated or revoked sooner. Performed at [facility], [place] [address] [place], [place] [address]     Coagulation Studies: Recent Labs    02/27/19 0500 02/28/19 0500 03/01/19 0500  LABPROT 20.2* 21.9* 21.3*  INR 1.7* 1.9* 1.9*    Urinalysis: No results for input(s): COLORURINE, LABSPEC, PHURINE, GLUCOSEU, HGBUR, BILIRUBINUR, KETONESUR, PROTEINUR, UROBILINOGEN, NITRITE, LEUKOCYTESUR in the last 72 hours.  Invalid input(s): APPERANCEUR    Imaging: Vas [place] Upper Ext Vein Mapping (pre-op Avf)  Result Date: 02/28/2019 UPPER EXTREMITY VEIN MAPPING  Indications: Pre-access. Performing Technologist: Antonieta Pert RDMS, RVT  Examination Guidelines: A complete evaluation includes B-mode imaging, spectral Doppler, color Doppler, and power Doppler as needed of all accessible portions of each vessel. Bilateral testing is considered an integral part of a complete examination. Limited examinations for reoccurring indications may be performed as noted. +-----------------+-------------+----------+---------+ Right Cephalic   Diameter (cm)Depth (cm)Findings  +-----------------+-------------+----------+---------+ Shoulder              0.24        1.10             +-----------------+-------------+----------+---------+ Prox upper arm       0.30        0.80             +-----------------+-------------+----------+---------+ Mid upper arm        0.24        0.71             +-----------------+-------------+----------+---------+ Dist upper arm       0.28        0.51             +-----------------+-------------+----------+---------+ Antecubital fossa    0.77        0.28             +-----------------+-------------+----------+---------+ Prox forearm         0.51        0.78   thrombus  +-----------------+-------------+----------+---------+ Mid forearm          0.36        0.61   branching +-----------------+-------------+----------+---------+ Dist forearm         0.24        0.31             +-----------------+-------------+----------+---------+ Wrist  0.32        0.36             +-----------------+-------------+----------+---------+ +-----------------+-------------+----------+--------------+ Right Basilic    Diameter (cm)Depth (cm)   Findings    +-----------------+-------------+----------+--------------+ Shoulder             0.60        1.34                  +-----------------+-------------+----------+--------------+ Mid upper arm        0.54        1.15                  +-----------------+-------------+----------+--------------+ Dist upper arm       0.43        1.61     branching    +-----------------+-------------+----------+--------------+ Antecubital fossa    0.37        0.67                  +-----------------+-------------+----------+--------------+ Prox forearm         0.11        0.79                  +-----------------+-------------+----------+--------------+ Mid forearm                             not visualized +-----------------+-------------+----------+--------------+ Distal forearm                          not visualized  +-----------------+-------------+----------+--------------+ Elbow                                   not visualized +-----------------+-------------+----------+--------------+ Wrist                                   not visualized +-----------------+-------------+----------+--------------+ *See table(s) above for measurements and observations.  Diagnosing physician: Deitra Mayo MD Electronically signed by Deitra Mayo MD on 02/28/2019 at 3:10:20 PM.    Final      Medications:   . sodium chloride    . sodium chloride    . [START ON 03/02/2019] cefUROXime (ZINACEF)  IV     . alteplase      . amiodarone  200 mg Oral BID  . atorvastatin  80 mg Oral q1800  . Chlorhexidine Gluconate Cloth  6 each Topical Daily  . Chlorhexidine Gluconate Cloth  6 each Topical Q0600  . midodrine  10 mg Oral TID WC  . sildenafil  20 mg Oral BID  . sodium chloride flush  10 mL Intravenous Q12H  . sodium chloride flush  3 mL Intravenous Q12H   sodium chloride, sodium chloride, acetaminophen, loperamide, ondansetron (ZOFRAN) IV, sodium chloride flush, sodium chloride flush  Assessment/ Plan:   Chronic kidney disease stage IV baseline serum creatinine high twos.  Acute on chronic kidney disease initiated dialysis treatment third treatment was 02/27/2019.  Clip process [facility].  Appreciate assistance from Dr. Juanda [facility].  Next dialysis treatment will be 03/01/2019 Now deemed to be end-stage renal disease.  W .  Bleeding noted from dialysis catheter site pursestring suture placed per interventional radiology 02/28/2019.  Plan for placement of AV graft later this week appreciate assistance from Dr. Donzetta Matters  Hypertension/volume continue to ultrafilter  with dialysis.  Chronic hypotension being managed with midodrine.  Metabolic bone disease phosphorus remained stable at this time.  Will order PTH.  Anemia does not appear to be an issue at this point.  Systolic heart  failure with ejection fraction 20 to 25%  Coronary artery disease continue statin and chronic Coumadin  Paroxysmal atrial fibrillation amiodarone chronic Coumadin therapy being held by vascular surgery  History of V. tach status post St. Jude's ICD  History of pulmonary hypertension on chronic O2   LOS: [place] @TODAY @9 :53 AM

## 2019-03-01 NOTE — Progress Notes (Signed)
Pt's BP now is 86/74. Pt stated " I'm tired, but I'm okay." Dr. Elson Areas (MD on call) was notified and ordered to dc Sildenafil, hold the Thorazine and okay to give the Amiodarone tonight. Will continue to monitor pt.   03/01/19 2120  Vitals  Temp 98 F (36.7 C)  Temp Source Oral  BP (!) 86/74  MAP (mmHg) 79  BP Location Left Arm  BP Method Automatic  Patient Position (if appropriate) Lying  Pulse Rate 67  Oxygen Therapy  SpO2 96 %  O2 Device Nasal Cannula  O2 Flow Rate (L/min) 2 L/min  MEWS Score  MEWS RR 0  MEWS Pulse 0  MEWS Systolic 1  MEWS LOC 0  MEWS Temp 0  MEWS Score 1  MEWS Score Color Nyoka Cowden

## 2019-03-01 NOTE — Progress Notes (Signed)
Patient has been accepted at [facility] for OP HD treatment on a TTS schedule with a seat time of 1:00pm. He needs to arrive to his appointments 20 minutes early. If his first day of treatment falls on a Tuesday or a Thursday, he will need to arrive at 12:00pm to complete his intake paperwork prior to his first session. However, there is a good possibility that his first treatment will be this coming Saturday, in which case he will have to report to the clinic on Friday to complete his paperwork in order to start in the OP clinic on Saturday. Renal Navigator has notified Nephrologist/Dr. Justin Mend of patient's acceptance and TTS schedule and will follow in order to help with smooth transition from hospital to clinic. Renal Navigator will meet with patient to inform of schedule verbally and in writing.  [name], [place] Renal Navigator [phone]

## 2019-03-01 NOTE — Procedures (Signed)
Patient was seen on dialysis receiving dialysis through catheter with blood flows of 400 cc/min  Hemodynamically stable  Labs reviewed see note

## 2019-03-02 ENCOUNTER — Encounter (HOSPITAL_COMMUNITY): Payer: Self-pay | Admitting: *Deleted

## 2019-03-02 ENCOUNTER — Inpatient Hospital Stay (HOSPITAL_COMMUNITY): Payer: Medicare Other | Admitting: Anesthesiology

## 2019-03-02 ENCOUNTER — Encounter (HOSPITAL_COMMUNITY)
Admission: EM | Disposition: A | Discharge status: Home Health | Payer: Self-pay | Source: Home / Self Care | Attending: Internal Medicine

## 2019-03-02 DIAGNOSIS — I5043 Acute on chronic combined systolic (congestive) and diastolic (congestive) heart failure: Secondary | ICD-10-CM | POA: Diagnosis not present

## 2019-03-02 DIAGNOSIS — N186 End stage renal disease: Secondary | ICD-10-CM

## 2019-03-02 DIAGNOSIS — Z992 Dependence on renal dialysis: Secondary | ICD-10-CM

## 2019-03-02 HISTORY — PX: AV FISTULA PLACEMENT: SHX1204

## 2019-03-02 LAB — BASIC METABOLIC PANEL
Anion gap: 14 (ref 5–15)
BUN: 32 mg/dL — ABNORMAL HIGH (ref 8–23)
CO2: 25 mmol/L (ref 22–32)
Calcium: 9.1 mg/dL (ref 8.9–10.3)
Chloride: 96 mmol/L — ABNORMAL LOW (ref 98–111)
Creatinine, Ser: 5.06 mg/dL — ABNORMAL HIGH (ref 0.61–1.24)
GFR calc Af Amer: 13 mL/min — ABNORMAL LOW (ref >60)
GFR calc non Af Amer: 11 mL/min — ABNORMAL LOW (ref >60)
Glucose, Bld: 103 mg/dL — ABNORMAL HIGH (ref 70–99)
Potassium: 3.8 mmol/L (ref 3.5–5.1)
Sodium: 135 mmol/L (ref 135–145)

## 2019-03-02 LAB — MAGNESIUM: Magnesium: 1.9 mg/dL (ref 1.7–2.4)

## 2019-03-02 LAB — CBC
HCT: 43 % (ref 39.0–52.0)
Hemoglobin: 13.5 g/dL (ref 13.0–17.0)
MCH: 31.2 pg (ref 26.0–34.0)
MCHC: 31.4 g/dL (ref 30.0–36.0)
MCV: 99.3 fL (ref 80.0–100.0)
Platelets: 68 10*3/uL — ABNORMAL LOW (ref 150–400)
RBC: 4.33 MIL/uL (ref 4.22–5.81)
RDW: 15.1 % (ref 11.5–15.5)
WBC: 7.8 10*3/uL (ref 4.0–10.5)
nRBC: 0 % (ref 0.0–0.2)

## 2019-03-02 LAB — GLUCOSE, CAPILLARY
Glucose-Capillary: 114 mg/dL — ABNORMAL HIGH (ref 70–99)
Glucose-Capillary: 111 mg/dL — ABNORMAL HIGH (ref 70–99)
Glucose-Capillary: 97 mg/dL (ref 70–99)
Glucose-Capillary: 94 mg/dL (ref 70–99)
Glucose-Capillary: 114 mg/dL — ABNORMAL HIGH (ref 70–99)

## 2019-03-02 LAB — PROTIME-INR
INR: 1.6 — ABNORMAL HIGH (ref 0.8–1.2)
Prothrombin Time: 18.8 seconds — ABNORMAL HIGH (ref 11.4–15.2)

## 2019-03-02 SURGERY — ARTERIOVENOUS (AV) FISTULA CREATION
Anesthesia: Monitor Anesthesia Care | Site: Arm Upper | Laterality: Right

## 2019-03-02 MED ORDER — FENTANYL CITRATE (PF) 250 MCG/5ML IJ SOLN
INTRAMUSCULAR | Status: AC
  Filled 2019-03-02: qty 5

## 2019-03-02 MED ORDER — MIDAZOLAM HCL 5 MG/5ML IJ SOLN
INTRAMUSCULAR | Status: DC | PRN
  Administered 2019-03-02: 1 mg via INTRAVENOUS

## 2019-03-02 MED ORDER — PROMETHAZINE HCL 25 MG/ML IJ SOLN: 6.2500 mg | INTRAMUSCULAR | Status: DC | PRN

## 2019-03-02 MED ORDER — LIDOCAINE HCL (PF) 1 % IJ SOLN: 5.0000 mL | INTRAMUSCULAR | Status: DC | PRN

## 2019-03-02 MED ORDER — HEPARIN SODIUM (PORCINE) 1000 UNIT/ML DIALYSIS
1000.0000 [IU] | INTRAMUSCULAR | Status: DC | PRN
  Filled 2019-03-02: qty 1

## 2019-03-02 MED ORDER — HEPARIN SODIUM (PORCINE) 1000 UNIT/ML DIALYSIS: 1000.0000 [IU] | INTRAMUSCULAR | Status: DC | PRN

## 2019-03-02 MED ORDER — ACETAMINOPHEN 10 MG/ML IV SOLN: 1000.0000 mg | Freq: Once | INTRAVENOUS | Status: DC | PRN

## 2019-03-02 MED ORDER — ALTEPLASE 2 MG IJ SOLR: 2.0000 mg | Freq: Once | INTRAMUSCULAR | Status: DC | PRN

## 2019-03-02 MED ORDER — AMIODARONE HCL 200 MG PO TABS
200.0000 mg | ORAL_TABLET | Freq: Every day | ORAL | Status: DC
  Administered 2019-03-03: 09:00:00 200 mg via ORAL
  Filled 2019-03-02 (×2): qty 1

## 2019-03-02 MED ORDER — ACETAMINOPHEN 160 MG/5ML PO SOLN: 325.0000 mg | Freq: Once | ORAL | Status: DC | PRN

## 2019-03-02 MED ORDER — LIDOCAINE-EPINEPHRINE (PF) 1 %-1:200000 IJ SOLN
INTRAMUSCULAR | Status: DC | PRN
  Administered 2019-03-02: 14 mL

## 2019-03-02 MED ORDER — PROPOFOL 10 MG/ML IV BOLUS
INTRAVENOUS | Status: AC
  Filled 2019-03-02: qty 20

## 2019-03-02 MED ORDER — PROPOFOL 10 MG/ML IV BOLUS
INTRAVENOUS | Status: DC | PRN
  Administered 2019-03-02: 10 mg via INTRAVENOUS

## 2019-03-02 MED ORDER — SODIUM CHLORIDE 0.9 % IV SOLN
INTRAVENOUS | Status: DC | PRN
  Administered 2019-03-02: 500 mL

## 2019-03-02 MED ORDER — DEXAMETHASONE SODIUM PHOSPHATE 10 MG/ML IJ SOLN
INTRAMUSCULAR | Status: AC
  Filled 2019-03-02: qty 2

## 2019-03-02 MED ORDER — SODIUM CHLORIDE 0.9 % IV SOLN: 100.0000 mL | INTRAVENOUS | Status: DC | PRN

## 2019-03-02 MED ORDER — LIDOCAINE-PRILOCAINE 2.5-2.5 % EX CREA
1.0000 "application " | TOPICAL_CREAM | CUTANEOUS | Status: DC | PRN
  Filled 2019-03-02: qty 5

## 2019-03-02 MED ORDER — SODIUM CHLORIDE 0.9 % IV SOLN
INTRAVENOUS | Status: DC
  Administered 2019-03-02: 14:00:00 via INTRAVENOUS

## 2019-03-02 MED ORDER — ACETAMINOPHEN 325 MG PO TABS: 325.0000 mg | ORAL_TABLET | Freq: Once | ORAL | Status: DC | PRN

## 2019-03-02 MED ORDER — WARFARIN - PHARMACIST DOSING INPATIENT
Freq: Every day | Status: DC
  Administered 2019-03-02: 18:00:00

## 2019-03-02 MED ORDER — PHENYLEPHRINE HCL-NACL 10-0.9 MG/250ML-% IV SOLN
INTRAVENOUS | Status: DC | PRN
  Administered 2019-03-02: 25 ug/min via INTRAVENOUS

## 2019-03-02 MED ORDER — LIDOCAINE 2% (20 MG/ML) 5 ML SYRINGE
INTRAMUSCULAR | Status: AC
  Filled 2019-03-02: qty 15

## 2019-03-02 MED ORDER — PENTAFLUOROPROP-TETRAFLUOROETH EX AERO: 1.0000 "application " | INHALATION_SPRAY | CUTANEOUS | Status: DC | PRN

## 2019-03-02 MED ORDER — SODIUM CHLORIDE 0.9 % IV SOLN
INTRAVENOUS | Status: AC
  Filled 2019-03-02: qty 1.2

## 2019-03-02 MED ORDER — MEPERIDINE HCL 25 MG/ML IJ SOLN: 6.2500 mg | INTRAMUSCULAR | Status: DC | PRN

## 2019-03-02 MED ORDER — WARFARIN SODIUM 5 MG PO TABS
5.0000 mg | ORAL_TABLET | ORAL | Status: AC
  Administered 2019-03-02: 18:00:00 5 mg via ORAL
  Filled 2019-03-02: qty 1

## 2019-03-02 MED ORDER — LIDOCAINE-PRILOCAINE 2.5-2.5 % EX CREA: 1.0000 "application " | TOPICAL_CREAM | CUTANEOUS | Status: DC | PRN

## 2019-03-02 MED ORDER — CHLORHEXIDINE GLUCONATE CLOTH 2 % EX PADS
6.0000 | MEDICATED_PAD | Freq: Every day | CUTANEOUS | Status: DC
  Administered 2019-03-03: 6 via TOPICAL

## 2019-03-02 MED ORDER — ALBUMIN HUMAN 5 % IV SOLN
12.5000 g | Freq: Once | INTRAVENOUS | Status: AC
  Administered 2019-03-02: 12.5 g via INTRAVENOUS

## 2019-03-02 MED ORDER — PROPOFOL 500 MG/50ML IV EMUL
INTRAVENOUS | Status: DC | PRN
  Administered 2019-03-02: 25 ug/kg/min via INTRAVENOUS

## 2019-03-02 MED ORDER — OXYCODONE-ACETAMINOPHEN 5-325 MG PO TABS: 1.0000 | ORAL_TABLET | ORAL | Status: DC | PRN

## 2019-03-02 MED ORDER — FENTANYL CITRATE (PF) 250 MCG/5ML IJ SOLN
INTRAMUSCULAR | Status: DC | PRN
  Administered 2019-03-02: 50 ug via INTRAVENOUS

## 2019-03-02 MED ORDER — ONDANSETRON HCL 4 MG/2ML IJ SOLN
INTRAMUSCULAR | Status: AC
  Filled 2019-03-02: qty 4

## 2019-03-02 MED ORDER — ALBUMIN HUMAN 5 % IV SOLN
INTRAVENOUS | Status: AC
  Filled 2019-03-02: qty 250

## 2019-03-02 MED ORDER — 0.9 % SODIUM CHLORIDE (POUR BTL) OPTIME
TOPICAL | Status: DC | PRN
  Administered 2019-03-02: 1000 mL

## 2019-03-02 MED ORDER — LACTATED RINGERS IV SOLN: INTRAVENOUS | Status: DC

## 2019-03-02 MED ORDER — FENTANYL CITRATE (PF) 100 MCG/2ML IJ SOLN: 25.0000 ug | INTRAMUSCULAR | Status: DC | PRN

## 2019-03-02 MED ORDER — MIDAZOLAM HCL 2 MG/2ML IJ SOLN
INTRAMUSCULAR | Status: AC
  Filled 2019-03-02: qty 2

## 2019-03-02 SURGICAL SUPPLY — 60 items
ARMBAND PINK RESTRICT EXTREMIT (MISCELLANEOUS) ×6 IMPLANT
BAG DECANTER FOR FLEXI CONT (MISCELLANEOUS) ×2 IMPLANT
BIOPATCH RED 1 DISK 7.0 (GAUZE/BANDAGES/DRESSINGS) ×2 IMPLANT
BIOPATCH RED 1IN DISK 7.0MM (GAUZE/BANDAGES/DRESSINGS)
CANISTER SUCT 3000ML PPV (MISCELLANEOUS) ×4 IMPLANT
CATH PALINDROME RT-P 15FX19CM (CATHETERS) IMPLANT
CATH PALINDROME RT-P 15FX23CM (CATHETERS) IMPLANT
CATH PALINDROME RT-P 15FX28CM (CATHETERS) IMPLANT
CATH PALINDROME RT-P 15FX55CM (CATHETERS) IMPLANT
CLIP VESOCCLUDE MED 6/CT (CLIP) ×4 IMPLANT
CLIP VESOCCLUDE SM WIDE 6/CT (CLIP) ×4 IMPLANT
COVER PROBE W GEL 5X96 (DRAPES) ×4 IMPLANT
COVER SURGICAL LIGHT HANDLE (MISCELLANEOUS) ×4 IMPLANT
COVER WAND RF STERILE (DRAPES) ×4 IMPLANT
DERMABOND ADVANCED (GAUZE/BANDAGES/DRESSINGS) ×2
DERMABOND ADVANCED .7 DNX12 (GAUZE/BANDAGES/DRESSINGS) ×2 IMPLANT
DRAPE C-ARM 42X72 X-RAY (DRAPES) ×4 IMPLANT
DRAPE CHEST BREAST 15X10 FENES (DRAPES) ×4 IMPLANT
ELECT REM PT RETURN 9FT ADLT (ELECTROSURGICAL) ×4
ELECTRODE REM PT RTRN 9FT ADLT (ELECTROSURGICAL) ×2 IMPLANT
GAUZE 4X4 16PLY RFD (DISPOSABLE) ×4 IMPLANT
GLOVE BIO SURGEON STRL SZ7.5 (GLOVE) ×4 IMPLANT
GLOVE BIOGEL PI IND STRL 7.5 (GLOVE) IMPLANT
GLOVE BIOGEL PI IND STRL 8 (GLOVE) IMPLANT
GLOVE BIOGEL PI INDICATOR 7.5 (GLOVE) ×4
GLOVE BIOGEL PI INDICATOR 8 (GLOVE) ×2
GLOVE ECLIPSE 6.5 STRL STRAW (GLOVE) ×2 IMPLANT
GOWN STRL REUS W/ TWL LRG LVL3 (GOWN DISPOSABLE) ×4 IMPLANT
GOWN STRL REUS W/ TWL XL LVL3 (GOWN DISPOSABLE) ×2 IMPLANT
GOWN STRL REUS W/TWL LRG LVL3 (GOWN DISPOSABLE) ×4
GOWN STRL REUS W/TWL XL LVL3 (GOWN DISPOSABLE) ×2
HEMOSTAT SNOW SURGICEL 2X4 (HEMOSTASIS) IMPLANT
INSERT FOGARTY SM (MISCELLANEOUS) ×2 IMPLANT
KIT BASIN OR (CUSTOM PROCEDURE TRAY) ×4 IMPLANT
KIT TURNOVER KIT B (KITS) ×4 IMPLANT
NDL 18GX1X1/2 (RX/OR ONLY) (NEEDLE) ×2 IMPLANT
NDL HYPO 25GX1X1/2 BEV (NEEDLE) ×2 IMPLANT
NEEDLE 18GX1X1/2 (RX/OR ONLY) (NEEDLE) IMPLANT
NEEDLE HYPO 25GX1X1/2 BEV (NEEDLE) IMPLANT
NS IRRIG 1000ML POUR BTL (IV SOLUTION) ×4 IMPLANT
PACK CV ACCESS (CUSTOM PROCEDURE TRAY) ×4 IMPLANT
PACK SURGICAL SETUP 50X90 (CUSTOM PROCEDURE TRAY) ×2 IMPLANT
PAD ARMBOARD 7.5X6 YLW CONV (MISCELLANEOUS) ×8 IMPLANT
SOAP 2 % CHG 4 OZ (WOUND CARE) ×2 IMPLANT
STOCKINETTE IMPERVIOUS 9X36 MD (GAUZE/BANDAGES/DRESSINGS) ×2 IMPLANT
SUT ETHILON 3 0 PS 1 (SUTURE) ×4 IMPLANT
SUT MNCRL AB 4-0 PS2 18 (SUTURE) ×4 IMPLANT
SUT PROLENE 6 0 BV (SUTURE) IMPLANT
SUT SILK 2 0 SH (SUTURE) IMPLANT
SUT VIC AB 3-0 SH 27 (SUTURE) ×2
SUT VIC AB 3-0 SH 27X BRD (SUTURE) ×4 IMPLANT
SYR 10ML LL (SYRINGE) ×2 IMPLANT
SYR 20ML LL LF (SYRINGE) ×4 IMPLANT
SYR 5ML LL (SYRINGE) ×2 IMPLANT
SYR CONTROL 10ML LL (SYRINGE) ×2 IMPLANT
SYR TOOMEY 50ML (SYRINGE) IMPLANT
TOWEL GREEN STERILE (TOWEL DISPOSABLE) ×4 IMPLANT
TOWEL GREEN STERILE FF (TOWEL DISPOSABLE) ×8 IMPLANT
UNDERPAD 30X30 (UNDERPADS AND DIAPERS) ×4 IMPLANT
WATER STERILE IRR 1000ML POUR (IV SOLUTION) ×4 IMPLANT

## 2019-03-02 NOTE — Progress Notes (Signed)
Patient ID: [name], male   DOB: 23-Oct-1955, 62 y.o.   MRN: [id number]  This NP visited patient at the bedside as a follow up for palliaitve medicine needs and emotional support.  Patient is resting in bed and appears comfortable.    Attempt to engage in  conversation  regarding his current medical situation, treatment options, disposition and anticipatory care needs   He remains open to all offered and available medical interventions to prolong life.  Plan is to discharge home when medically stable.  We discussed OP community based palliative services and he is open and accepting.     Discussed with patient the importance of continued conversation with his family and the medical providers regarding overall plan of care and treatment options,  ensuring decisions are within the context of the patients values and GOCs.  Questions and concerns addressed     Total time spent on the unit was 15 minutes  Greater than 50% of the time was spent in counseling and coordination of care  Wadie Lessen NP  Palliative Medicine Team Team Phone # [phone] Pager [phone]

## 2019-03-02 NOTE — Progress Notes (Addendum)
Patient ID: [name], male   DOB: [birth year], 63 y.o.   MRN: [id number]     Advanced Heart Failure Rounding Note  PCP-Cardiologist: Minus Breeding, MD  [facility]: Dr. Haroldine Laws   Subjective:    No complaints today. Resting comfortably in bed. No resting dyspnea.  Awaiting AVF planned for today.    Objective:   Weight Range: 103.1 kg Body mass index is 32.6 kg/m.   Vital Signs:   Temp:  [97.7 F (36.5 C)-98.5 F (36.9 C)] 98.5 F (36.9 C) (10/29 0354) Pulse Rate:  [64-81] 79 (10/29 0354) Resp:  [15-27] 16 (10/28 1331) BP: (86-127)/(48-82) 107/48 (10/29 0354) SpO2:  [94 %-100 %] 95 % (10/29 0354) Weight:  [103.1 kg] 103.1 kg (10/29 0354) Last BM Date: 02/27/19  Weight change: Filed Weights   02/28/19 0730 03/01/19 0651 03/02/19 0354  Weight: 105.3 kg 104.6 kg 103.1 kg    Intake/Output:   Intake/Output Summary (Last 24 hours) at 03/02/2019 0728 Last data filed at 03/02/2019 0355 Gross per 24 hour  Intake 600 ml  Output 1300 ml  Net -700 ml      Physical Exam   PHYSICAL EXAM: General:  Obese AAM, fatigue appearing. No respiratory difficulty HEENT: normal Neck: supple. no JVD. RIJ TLC Carotids 2+ bilat; no bruits. No lymphadenopathy or thyromegaly appreciated. Cor: PMI nondisplaced. Regular rate & rhythm. No rubs, gallops or murmurs. RHD cath Lungs: clear Abdomen: obese soft, nontender, nondistended. No hepatosplenomegaly. No bruits or masses. Good bowel sounds. Extremities: no cyanosis, clubbing, rash, 2+ bilateral LE edema Neuro: alert & oriented x 3, cranial nerves grossly intact. moves all 4 extremities w/o difficulty. Affect pleasant.    Telemetry   NSR 80s w/ PVCs. No NSVT    Labs    CBC Recent Labs    03/01/19 0500 03/02/19 0500  WBC 8.6 7.8  HGB 13.4 13.5  HCT 42.4 43.0  MCV 98.4 99.3  PLT 76* 68*   Basic Metabolic Panel Recent Labs    02/28/19 0500 03/01/19 0500 03/02/19 0500  NA 134* 135  --   K 3.8 3.7  --   CL 94* 95*  --    CO2 26 26  --   GLUCOSE 113* 105*  --   BUN 26* 38*  --   CREATININE 3.86* 4.87*  --   CALCIUM 9.3 9.3  --   MG 1.8 1.8 1.9   Liver Function Tests No results for input(s): AST, ALT, ALKPHOS, BILITOT, PROT, ALBUMIN in the last 72 hours. No results for input(s): LIPASE, AMYLASE in the last 72 hours. Cardiac Enzymes No results for input(s): CKTOTAL, CKMB, CKMBINDEX, TROPONINI in the last 72 hours.  BNP: BNP (last 3 results) Recent Labs    07/19/18 1106 01/02/19 1552 02/13/19 1114  BNP 1,608.5* 2,154.7* 2,637.3*    ProBNP (last 3 results) Recent Labs    11/01/18 1229  PROBNP 2,158.0*     D-Dimer No results for input(s): DDIMER in the last 72 hours. Hemoglobin A1C No results for input(s): HGBA1C in the last 72 hours. Fasting Lipid Panel No results for input(s): CHOL, HDL, LDLCALC, TRIG, CHOLHDL, LDLDIRECT in the last 72 hours. Thyroid Function Tests No results for input(s): TSH, T4TOTAL, T3FREE, THYROIDAB in the last 72 hours.  Invalid input(s): FREET3  Other results:   Imaging    No results found.   Medications:     Scheduled Medications: . amiodarone  200 mg Oral BID  . atorvastatin  80 mg Oral q1800  . Chlorhexidine  Gluconate Cloth  6 each Topical Daily  . Chlorhexidine Gluconate Cloth  6 each Topical Q0600  . chlorproMAZINE  25 mg Oral TID  . midodrine  10 mg Oral TID WC  . sodium chloride flush  10 mL Intravenous Q12H  . sodium chloride flush  3 mL Intravenous Q12H    Infusions: . sodium chloride    . sodium chloride    . cefUROXime (ZINACEF)  IV      PRN Medications: sodium chloride, sodium chloride, acetaminophen, loperamide, ondansetron (ZOFRAN) IV, sodium chloride flush, sodium chloride flush    Patient Profile   63 y/o male with severe systolic HF with biventricular dysfunction due to NICM and advanced CKD. Recently hospitalized with low output HF. Now returns with progressive NYHA IV symptoms with marked fluid overload not  responding to titration of oral diuretic regimen. With CKD 4, biventricular HF and morbid obesity advanced options are quite limited.  Assessment/Plan    1. Acute on Chronic Systolic HF with biventricular dysfunction due to NICM: mild nonobstructive CAD on [facility] 12/2018. CM out of proportion of CAD. PYP scan and myeloma panel completed and were not suggestive of cardiac amyloidosis. S/p St Jude ICD. ECHO 07/28/2017 EF 20-25%. RV mildly dilated. Echo 11/17/18 EF 20-25% RV dilated and severely HK with septal flattening. Presented to ED 10/12 w/ NYHA IV symptoms and markedly volume overload w/ 3+ bilateral LEE, left basilar rales and elevated JVD to level of ear. Admit Wt up 10+ lb from home baseline. BNP 2600 on admit.  - Poor initial response to high dose IV diuretics w/ increase in SCr from  2.76>>3.30, prompting initiation of inotropic support, started on milrinone. Milrinone later stopped due to ventricular ectopy. HD started on 10/23 - Volume status managed with HD.  - Wt down 49 lb since admit  2. Acute on CKD stage 3 - Now s/p HD catheter placment.Started HD on 10/23. Tolerating well.  - Nephrology appreciated - Will need permanent HD access and long-term HD.  - VVS consulted for AVF. Vein mapping completed. Plan for AVF today  - Will need to arrange for outpatient HD  3. CAD - LHC8/20with LAD 20%50% distal left circ and 20% mid RCA stenosis. No significant disease in left main or LAD. -  Hs Trop 284 but likely demand ischemia in the setting of a/c CHF and abnormal renal function.   - Continue statin. - Hold ? blocker due to soft BP/ concern for low output - No ASA due to chronic coumadin  4. PAF  -s/p recent DC-CV for AFL.  - Continue amio 200 mg bid (had recent VT), he is in NSR.    - Coumadin held for AVF. INR 1.6.   6. VT -Has St Jude ICD.  -Had VT episode (rate 190 bpm) w/ delivery of shock on 02/07/19  -Started on IV amiodarone 10/14 w/ improvement, NSVT less frequent off  milrinone.  - Continue amiodarone 200 mg po twice a day.  -Keep K >4.0 and Mg > 2.0 .  7. Pulmonary HTN - On home O2 (2L).  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 Had negative V/Q scan ruling out CTEPH.  - RHC PVR 6.5. Was started on sildenafil 20 mg three times a day.  - Now off sildenafil due to low BP and need to preserve renal perfusion  8. Hypokalemia: -stable at 3.7 yesterday. F/u BMP pending   9. Thrombocytopenia - PLTs down to 68.  - HIT antibody  WNL.  -Continue to monitor plt ct  10. Deconditioning - PT/CR to see   Length of Stay: [address] PA-C  03/02/2019, 7:28 AM  Advanced Heart Failure Team Pager [phone] (M-F; 7a - 4p)  Please contact [facility] for night-coverage after hours (4p -7a ) and weekends on [email or web address]  Patient seen and examined with the above-signed Advanced Practice Provider and/or Housestaff. I personally reviewed laboratory data, imaging studies and relevant notes. I independently examined the patient and formulated the important aspects of the plan. I have edited the note to reflect any of my changes or salient points. I have personally discussed the plan with the patient and/or family.  Volume status stable on HD. For AVF today. INR 1.6. Will resume coumadin tonight after surgery. No need for heparin bridge. PLTs remain low. HIT WNL. Stable for d/c from cardiac perspective when Renal issues worked out.   Glori Bickers, MD  11:14 AM

## 2019-03-02 NOTE — Progress Notes (Signed)
Physical Therapy Treatment Patient Details Name: [name] MRN: [id number] DOB: 28-Sep-1955 Today's Date: 03/02/2019    History of Present Illness [name] is a 63 y.o. male with history of longstanding cardiomyopathy out of proportion to CAD/chronic biventricular CHF (EF 20-25% 07/2017), prior VT, s/p St Jude ICD, CAD, CVA, OSA on CPAP, pulmonary HTN on O2 at home, PAF on coumadin, HTN, DM, and CKD stage 3.    PT Comments    Pt making steady progress with mobility. Pt does not want HHPT. Will continue to follow.    Follow Up Recommendations  Supervision - Intermittent(Pt is refusing HH PT)     Equipment Recommendations  Rolling walker with 5" wheels    Recommendations for Other Services       Precautions / Restrictions Precautions Precautions: Fall Restrictions Weight Bearing Restrictions: No    Mobility  Bed Mobility Overal bed mobility: Needs Assistance Bed Mobility: Sit to Supine;Supine to Sit     Supine to sit: Modified independent (Device/Increase time) Sit to supine: Supervision   General bed mobility comments: Assist for lines  Transfers Overall transfer level: Needs assistance Equipment used: Rolling walker (2 wheeled) Transfers: Sit to/from Stand Sit to Stand: Supervision         General transfer comment: Assist for lines  Ambulation/Gait Ambulation/Gait assistance: Supervision Gait Distance (Feet): 300 Feet Assistive device: Rolling walker (2 wheeled) Gait Pattern/deviations: Step-through pattern;Decreased stride length;Decreased dorsiflexion - left;Decreased dorsiflexion - right Gait velocity: decr Gait velocity interpretation: <1.31 ft/sec, indicative of household ambulator General Gait Details: Assist for lines. Pt with 2 brief standing rest breaks. Amb on 2L of O2   Stairs             Wheelchair Mobility    Modified Rankin (Stroke Patients Only)       Balance Overall balance assessment: Needs  assistance Sitting-balance support: No upper extremity supported;Feet supported Sitting balance-Leahy Scale: Good     Standing balance support: No upper extremity supported Standing balance-Leahy Scale: Fair                              Cognition Arousal/Alertness: Awake/alert Behavior During Therapy: WFL for tasks assessed/performed Overall Cognitive Status: Within Functional Limits for tasks assessed                                        Exercises      General Comments        Pertinent Vitals/Pain Pain Assessment: Faces Faces Pain Scale: No hurt    Home Living                      Prior Function            PT Goals (current goals can now be found in the care plan section) Progress towards PT goals: Goals met and updated - see care plan    Frequency    Min 3X/week      PT Plan Current plan remains appropriate    Co-evaluation              AM-PAC PT "6 Clicks" Mobility   Outcome Measure  Help needed turning from your back to your side while in a flat bed without using bedrails?: None Help needed moving from lying on your back to sitting on the side of  a flat bed without using bedrails?: None Help needed moving to and from a bed to a chair (including a wheelchair)?: None Help needed standing up from a chair using your arms (e.g., wheelchair or bedside chair)?: None Help needed to walk in hospital room?: A Little Help needed climbing 3-5 steps with a railing? : A Little 6 Click Score: 22    End of Session Equipment Utilized During Treatment: Oxygen Activity Tolerance: Patient tolerated treatment well Patient left: in bed;with call bell/phone within reach Nurse Communication: Mobility status PT Visit Diagnosis: Unsteadiness on feet (R26.81);Other abnormalities of gait and mobility (R26.89);Muscle weakness (generalized) (M62.81)     Time: 0092-3300 PT Time Calculation (min) (ACUTE ONLY): 29 min  Charges:   $Gait Training: 23-37 mins                     [place] Pager [phone] Office [place] 03/02/2019, 11:28 AM

## 2019-03-02 NOTE — Progress Notes (Signed)
[facility] ROUNDING NOTE   Subjective:   63 year old gentleman with history of cardiomyopathy and advanced chronic kidney disease felt acute on chronic kidney insufficiency secondary to suspected cardiorenal syndrome.  Patient appears to be agreeable for AV fistula placement and is now deemed end-stage renal disease.  Is a history of acute on chronic systolic heart failure with biventricular dysfunction cardiac catheterization showed nonobstructive coronary artery disease left heart catheterization 8/20.  It was not felt that he had cardiac amyloid.  Status post St. Jude's ICD.  Echo showed ejection fraction 20 to 25% July 2020.  He had poor response to high-dose IV diuretics and was initiated on hemodialysis.  Patient was seen on dialysis 03/01/2019 he tolerated this treatment with 1.0 L ultrafiltered.  He will be scheduled for right upper extremity AV graft 03/02/2019.  Initiated dialysis 02/24/2019.  Blood pressure 107/48 pulse 79 temperature 98.5 O2 sats 95% 2 L nasal cannula  Sodium 135 potassium 3.7 chloride 95 CO2 26 BUN 38 creatinine 4.87 glucose 105 calcium 9.3 WBC 7.8 hemoglobin 13.5 platelets 68  Amiodarone 200 mg twice daily atorvastatin 80 mg daily,  , midodrine 10 mg 3 times daily,     Objective:  Vital signs in last 24 hours:  Temp:  [97.7 F (36.5 C)-98.5 F (36.9 C)] 98.5 F (36.9 C) (10/29 0354) Pulse Rate:  [64-81] 79 (10/29 0354) Resp:  [15-27] 16 (10/28 1331) BP: (86-115)/(48-82) 107/48 (10/29 0354) SpO2:  [94 %-100 %] 95 % (10/29 0354) Weight:  [103.1 kg] 103.1 kg (10/29 0354)  Weight change: -2.223 kg Filed Weights   02/28/19 0730 03/01/19 0651 03/02/19 0354  Weight: 105.3 kg 104.6 kg 103.1 kg    Intake/Output: I/O last 3 completed shifts: In: 720 [P.O.:720] Out: 1400 [Urine:400; Other:1000]   Intake/Output this shift:  No intake/output data recorded.  General:  AAOx3 NAD HEENT: MMM [place] AT anicteric sclera Neck:  No JVD, no  adenopathy CV:  Heart RRR  Lungs:  L/S CTA bilaterally Abd:  abd SNT/ND with normal BS GU:  Bladder non-palpable Extremities: 1+ pitting edema lower extremities Skin:  No skin rash   Basic Metabolic Panel: Recent Labs  Lab 02/25/19 0452 02/26/19 0620 02/27/19 0500 02/28/19 0500 03/01/19 0500 03/02/19 0500  NA 142 139 137 134* 135  --   K 4.0 4.8 3.8 3.8 3.7  --   CL 100 100 95* 94* 95*  --   CO2 30 28 29 26 26   --   GLUCOSE 131* 105* 91 113* 105*  --   BUN 50* 33* 40* 26* 38*  --   CREATININE 3.74* 3.56* 3.86* 3.86* 4.87*  --   CALCIUM 9.0 9.4 9.6 9.3 9.3  --   MG 2.2 2.0 1.9 1.8 1.8 1.9    Liver Function Tests: No results for input(s): AST, ALT, ALKPHOS, BILITOT, PROT, ALBUMIN in the last 168 hours. No results for input(s): LIPASE, AMYLASE in the last 168 hours. No results for input(s): AMMONIA in the last 168 hours.  CBC: Recent Labs  Lab 02/27/19 0500 02/28/19 0500 02/28/19 0925 03/01/19 0500 03/02/19 0500  WBC 8.3 9.0 8.9 8.6 7.8  HGB 13.6 13.6 13.7 13.4 13.5  HCT 41.7 42.9 42.0 42.4 43.0  MCV 97.2 98.8 98.4 98.4 99.3  PLT 158 64* 62* 76* 68*    Cardiac Enzymes: No results for input(s): CKTOTAL, CKMB, CKMBINDEX, TROPONINI in the last 168 hours.  BNP: Invalid input(s): POCBNP  CBG: Recent Labs  Lab 03/01/19 0711 03/01/19 1339 03/01/19 1609  03/01/19 2118 03/02/19 0730  GLUCAP 101* 83 143* 111* 114*    Microbiology: Results for orders placed or performed during the hospital encounter of 02/13/19  SARS CORONAVIRUS 2 (TAT 6-24 HRS) Nasopharyngeal Nasopharyngeal Swab     Status: None   Collection Time: 02/13/19  1:34 PM   Specimen: Nasopharyngeal Swab  Result Value Ref Range Status   SARS Coronavirus 2 NEGATIVE NEGATIVE Final    Comment: (NOTE) SARS-CoV-2 target nucleic acids are NOT DETECTED. The SARS-CoV-2 RNA is generally detectable in upper and lower respiratory specimens during the acute phase of infection. Negative results do not  preclude SARS-CoV-2 infection, do not rule out co-infections with other pathogens, and should not be used as the sole basis for treatment or other patient management decisions. Negative results must be combined with clinical observations, patient history, and epidemiological information. The expected result is Negative. Fact Sheet for Patients: SugarRoll.be Fact Sheet for Healthcare Providers: https://www.woods-mathews.com/ This test is not yet approved or cleared by the [place] FDA and  has been authorized for detection and/or diagnosis of SARS-CoV-2 by FDA under an Emergency Use Authorization (EUA). This EUA will remain  in effect (meaning this test can be used) for the duration of the COVID-19 declaration under Section 56 4(b)(1) of the Act, 21 U.S.C. section 360bbb-3(b)(1), unless the authorization is terminated or revoked sooner. Performed at [facility], [place] [address] [place], [place] [address]     Coagulation Studies: Recent Labs    02/28/19 0500 03/01/19 0500 03/02/19 0500  LABPROT 21.9* 21.3* 18.8*  INR 1.9* 1.9* 1.6*    Urinalysis: No results for input(s): COLORURINE, LABSPEC, PHURINE, GLUCOSEU, HGBUR, BILIRUBINUR, KETONESUR, PROTEINUR, UROBILINOGEN, NITRITE, LEUKOCYTESUR in the last 72 hours.  Invalid input(s): APPERANCEUR    Imaging: Vas [place] Upper Ext Vein Mapping (pre-op Avf)  Result Date: 02/28/2019 UPPER EXTREMITY VEIN MAPPING  Indications: Pre-access. Performing Technologist: Antonieta Pert RDMS, RVT  Examination Guidelines: A complete evaluation includes B-mode imaging, spectral Doppler, color Doppler, and power Doppler as needed of all accessible portions of each vessel. Bilateral testing is considered an integral part of a complete examination. Limited examinations for reoccurring indications may be performed as noted. +-----------------+-------------+----------+---------+ Right Cephalic   Diameter  (cm)Depth (cm)Findings  +-----------------+-------------+----------+---------+ Shoulder             0.24        1.10             +-----------------+-------------+----------+---------+ Prox upper arm       0.30        0.80             +-----------------+-------------+----------+---------+ Mid upper arm        0.24        0.71             +-----------------+-------------+----------+---------+ Dist upper arm       0.28        0.51             +-----------------+-------------+----------+---------+ Antecubital fossa    0.77        0.28             +-----------------+-------------+----------+---------+ Prox forearm         0.51        0.78   thrombus  +-----------------+-------------+----------+---------+ Mid forearm          0.36        0.61   branching +-----------------+-------------+----------+---------+ Dist forearm  0.24        0.31             +-----------------+-------------+----------+---------+ Wrist                0.32        0.36             +-----------------+-------------+----------+---------+ +-----------------+-------------+----------+--------------+ Right Basilic    Diameter (cm)Depth (cm)   Findings    +-----------------+-------------+----------+--------------+ Shoulder             0.60        1.34                  +-----------------+-------------+----------+--------------+ Mid upper arm        0.54        1.15                  +-----------------+-------------+----------+--------------+ Dist upper arm       0.43        1.61     branching    +-----------------+-------------+----------+--------------+ Antecubital fossa    0.37        0.67                  +-----------------+-------------+----------+--------------+ Prox forearm         0.11        0.79                  +-----------------+-------------+----------+--------------+ Mid forearm                             not visualized  +-----------------+-------------+----------+--------------+ Distal forearm                          not visualized +-----------------+-------------+----------+--------------+ Elbow                                   not visualized +-----------------+-------------+----------+--------------+ Wrist                                   not visualized +-----------------+-------------+----------+--------------+ *See table(s) above for measurements and observations.  Diagnosing physician: Deitra Mayo MD Electronically signed by Deitra Mayo MD on 02/28/2019 at 3:10:20 PM.    Final      Medications:   . sodium chloride    . sodium chloride    . cefUROXime (ZINACEF)  IV     . amiodarone  200 mg Oral BID  . atorvastatin  80 mg Oral q1800  . Chlorhexidine Gluconate Cloth  6 each Topical Daily  . Chlorhexidine Gluconate Cloth  6 each Topical Q0600  . chlorproMAZINE  25 mg Oral TID  . midodrine  10 mg Oral TID WC  . sodium chloride flush  10 mL Intravenous Q12H  . sodium chloride flush  3 mL Intravenous Q12H   sodium chloride, sodium chloride, acetaminophen, loperamide, ondansetron (ZOFRAN) IV, sodium chloride flush, sodium chloride flush  Assessment/ Plan:   Chronic kidney disease stage IV baseline serum creatinine high twos.  Acute on chronic kidney disease initiated dialysis treatment third treatment was 02/27/2019.  Clip process [facility] he will be on a Tuesday Thursday Saturday schedule.  Appreciate assistance from Dr. Juanda Crum[facility] Now deemed to be end-stage renal disease.  W .  Bleeding noted from  dialysis catheter site pursestring suture placed per interventional radiology 02/28/2019.  Plan for placement of AV graft 03/02/2019.  We shall plan dialysis this evening after surgery.  Possible discharge 03/03/2019  Hypertension/volume continue to ultrafilter with dialysis.  Chronic hypotension being managed with midodrine.  Metabolic bone disease  phosphorus remained stable at this time.  PTH 141  Anemia does not appear to be an issue at this point.  Systolic heart failure with ejection fraction 20 to 25%  Coronary artery disease continue statin and chronic Coumadin  Paroxysmal atrial fibrillation amiodarone chronic Coumadin therapy being held by vascular surgery  History of V. tach status post St. Jude's ICD  History of pulmonary hypertension on chronic O2  Patient is stable for discharge from nephrology standpoint   LOS: [place] @TODAY @9 :03 AM

## 2019-03-02 NOTE — Progress Notes (Signed)
  Progress Note    03/02/2019 2:01 PM Day of Surgery  Subjective: No overnight issues  Vitals:   03/01/19 2120 03/02/19 0354  BP: (!) 86/74 (!) 107/48  Pulse: 67 79  Resp:    Temp: 98 F (36.7 C) 98.5 F (36.9 C)  SpO2: 96% 95%    Physical Exam: Awake alert oriented Nonlabored respirations Right palpable brachial and radial pulses  CBC    Component Value Date/Time   WBC 7.8 03/02/2019 0500   RBC 4.33 03/02/2019 0500   HGB 13.5 03/02/2019 0500   HCT 43.0 03/02/2019 0500   PLT 68 (L) 03/02/2019 0500   MCV 99.3 03/02/2019 0500   MCH 31.2 03/02/2019 0500   MCHC 31.4 03/02/2019 0500   RDW 15.1 03/02/2019 0500   LYMPHSABS 0.5 (L) 01/04/2019 0429   MONOABS 0.6 01/04/2019 0429   EOSABS 0.0 01/04/2019 0429   BASOSABS 0.0 01/04/2019 0429    BMET    Component Value Date/Time   NA 135 03/02/2019 0800   NA 150 (H) 08/24/2017 1417   K 3.8 03/02/2019 0800   CL 96 (L) 03/02/2019 0800   CO2 25 03/02/2019 0800   GLUCOSE 103 (H) 03/02/2019 0800   BUN 32 (H) 03/02/2019 0800   BUN 15 08/24/2017 1417   CREATININE 5.06 (H) 03/02/2019 0800   CREATININE 1.37 (H) 12/17/2015 1209   CALCIUM 9.1 03/02/2019 0800   GFRNONAA 11 (L) 03/02/2019 0800   GFRAA 13 (L) 03/02/2019 0800    INR    Component Value Date/Time   INR 1.6 (H) 03/02/2019 0500   INR 3.0 07/04/2010 0949     Intake/Output Summary (Last 24 hours) at 03/02/2019 1401 Last data filed at 03/02/2019 0355 Gross per 24 hour  Intake 240 ml  Output 225 ml  Net 15 ml     Assessment/plan:  63 y.o. male is here with end-stage renal disease.  Currently dialyzing via right IJ tunneled dialysis catheter.  Plan for right arm AV fistula versus graft today in OR.   Era Parr C. Donzetta Matters, MD [facility] Office: [phone] Pager: [phone]  03/02/2019 2:01 PM

## 2019-03-02 NOTE — Transfer of Care (Signed)
Immediate Anesthesia Transfer of Care Note  Patient: [name]  Procedure(s) Performed: Arteriovenous (Av) Fistula Creation right upper arm (Right Arm Upper)  Patient Location: PACU  Anesthesia Type:MAC  Level of Consciousness: awake, alert  and oriented  Airway & Oxygen Therapy: Patient Spontanous Breathing and Patient connected to nasal cannula oxygen  Post-op Assessment: Report given to RN, Post -op Vital signs reviewed and stable and Patient moving all extremities X 4  Post vital signs: Reviewed and stable  Last Vitals:  Vitals Value Taken Time  BP 89/71 03/02/19 1605  Temp    Pulse 93 03/02/19 1611  Resp 23 03/02/19 1611  SpO2 97 % 03/02/19 1611  Vitals shown include unvalidated device data.  Last Pain:  Vitals:   03/02/19 0800  TempSrc:   PainSc: 0-No pain      Patients Stated Pain Goal: 0 (48/54/62 7035)  Complications: No apparent anesthesia complications

## 2019-03-02 NOTE — Op Note (Signed)
    Patient name: [name] MRN: [id number] DOB: 07/16/1955 Sex: male  03/02/2019 Pre-operative Diagnosis: End-stage renal disease Post-operative diagnosis:  Same Surgeon:  Erlene Quan C. Donzetta Matters, MD Assistant: Laurence Slate, PA Procedure Performed:  Right arm first stage basilic vein fistula creation  Indications: 63 year old male with now history end-stage renal disease.  He has a right IJ tunnel catheter in place.  He is indicated for permanent access in his right upper extremity.  Findings: Cephalic vein was large at the antecubital but appeared to have chronic appearing thrombus within it was much smaller more cephalad in the arm.  The basilic vein just above the antecubital measured approximately 4 mm external diameter.  This was connected to what appeared to be a high takeoff of the radial artery there was strong thrill at completion.  There was a good signal in the radial and ulnar arteries at the wrist.   Procedure:  The patient was identified in the holding area and taken to the operating room where is placed supine operative MAC anesthesia induced.  He was sterilely prepped and draped in the right upper extremity usual fashion antibiotics were minister and timeout was called.  Ultrasound was used to identify what was appeared to be a suitable basilic vein as well as early bifurcation of the brachial artery.  The area was anesthetized 1% lidocaine with epinephrine.  Transverse incision was made above the antecubitum.  We dissected down to the basilic vein.  We dissected out the branches divided the skin clips and ties.  We marked the vein for orientation.  We dissected through the fascia identified the brachial vein as well as brachial artery although this was more likely an aberrant radial artery given his location.  Vesseloops placed around this.  The vein was then transected distally spatulated flushed with heparinized saline and clamped.  The arteries clamped distally proximally opened  longitudinally flushed with heparinized saline by directions.  The vein was then sewn end-to-side with 6-0 Prolene suture.  Prior completion anastomosis we allowed flushing all directions.  Upon completion there was a good thrill in the renal vein.  There was good signal at the radial ulnar arteries at the wrist.  We irrigated the wound obtain hemostasis closed in layers with Vicryl and Monocryl.  Dermabond was placed to the level the skin.  He was then awakened anesthesia having tolerated procedure without any complication.  Counts were correct at completion.  EBL: 20 cc    Suprena Travaglini C. Donzetta Matters, MD [facility] Office: [phone] Pager: [phone]

## 2019-03-02 NOTE — Progress Notes (Signed)
ANTICOAGULATION CONSULT NOTE - Follow Up Consult  Pharmacy Consult for Warfarin and IV anticogaulation Indication: atrial fibrillation  No Active Allergies  Patient Measurements: Height: 5\' 10"  (177.8 cm) Weight: 227 lb 1.2 oz (103 kg) IBW/kg (Calculated) : 73  Heparin dosing weight: 101 kg  Vital Signs: Temp: 97 F (36.1 C) (10/29 1715) BP: 101/79 (10/29 1715) Pulse Rate: 93 (10/29 1715)  Labs: Recent Labs    02/28/19 0500 02/28/19 0925 03/01/19 0500 03/02/19 0500 03/02/19 0800  HGB 13.6 13.7 13.4 13.5  --   HCT 42.9 42.0 42.4 43.0  --   PLT 64* 62* 76* 68*  --   LABPROT 21.9*  --  21.3* 18.8*  --   INR 1.9*  --  1.9* 1.6*  --   CREATININE 3.86*  --  4.87*  --  5.06*    Estimated Creatinine Clearance: 18.2 mL/min (A) (by C-G formula based on SCr of 5.06 mg/dL (H)).   Medical History: Past Medical History:  Diagnosis Date  . CAD (coronary artery disease)    a. Initial nonobst by cath 2009. b. Cath 01/2013 in setting of VT storm: obstructive distal Cx disease (small and terminates in the AV groove, unlikely to cause significant ischemia or electrical instability), nonobstructive RCA disease, EF 15-20%.   . Cerebrovascular accident [facility])    a. 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 Chronic systolic CHF (congestive heart failure) ([place])    a. Likely NICM (out of proportion to CAD). b. 2009 - EF 25-30% by echo, 01/2013: 15-20% by cath. c. 11/2014 Echo: EF 35-40%, Gr1 DD, mild MR, mod TR, PASP 14mmHg.  . CKD (chronic kidney disease), stage II   . Dyslipidemia   . Gout   . HTN (hypertension)   . Hypokalemia   . ICD (implantable cardiac defibrillator) in place   . Insulin dependent diabetes mellitus   . Lipoma   . Nonischemic cardiomyopathy ([place])    a.  11/2014 Echo: EF 35-40%, Gr1 DD, mild MR, mod TR, PASP 88mmHg.  . OSA (obstructive sleep apnea)    does not wear cpap  . PAF (paroxysmal atrial fibrillation) ([place])    a. Noted 05/2008 by EKG;  b. CHA2DS2VASc =  5-6-->coumadin.  . Paroxysmal VT ([place])    a. s/p St. Jude ICD 2007. b. H/o paroxysmal VT/VF including VT storm 12/2012 admission prompting amio initiation;  c. 01/2013 ICD upgrade SJM [id number] Ellipse VR single lead ICD.  [name] Pulmonary HTN ([place])    a. Mild by cath 01/2013.    Medications:  Scheduled:  . [START ON 03/03/2019] amiodarone  200 mg Oral Daily  . atorvastatin  80 mg Oral q1800  . Chlorhexidine Gluconate Cloth  6 each Topical Daily  . Chlorhexidine Gluconate Cloth  6 each Topical Q0600  . Chlorhexidine Gluconate Cloth  6 each Topical Q0600  . chlorproMAZINE  25 mg Oral TID  . midodrine  10 mg Oral TID WC  . sodium chloride flush  10 mL Intravenous Q12H  . sodium chloride flush  3 mL Intravenous Q12H   Infusions:  . sodium chloride    . sodium chloride    . sodium chloride    . sodium chloride    . sodium chloride    . sodium chloride    . sodium chloride 10 mL/hr at 03/02/19 1348  . albumin human      Assessment: Pt is a 63 y/o male with PMH of CHF (EF 20-25%), prior VT s/p St Jude ICD, CAD,  CVA, OSA on CPAP, pulmonary HTN, HTN, DM, CKDs stage III, and PAF on coumadin PTA (5mg  daily, except 2.5 mg on fridays). Pharmacy has been consulted to dose warfarin s/p HD catheter placed 10/23.  INR today still sub-therapeutic 1.9.  No overt bleeding or complications noted - but oozing from HD cath site over weekend and early this week. H/H stable  PLTC fell from 150-200s to 60-70s - stopped all heparin and sent HIT antibody, still in process   Pt now s/p HD access placement, pharmacy to restart warfarin with no bridge.  Goal of Therapy:  INR 2-3 Monitor platelets by anticoagulation protocol: Yes   Plan:  -Warfarin 5mg  po x1 tonight -Daily protime   Arrie Senate, PharmD, BCPS Clinical Pharmacist [phone] Please check AMION for all [place] numbers 03/02/2019

## 2019-03-02 NOTE — TOC Initial Note (Signed)
Transition of Care [facility]) - Initial/Assessment Note    Patient Details  Name: [name] MRN: [id number] Date of Birth: [birth year]  Transition of Care [facility]) CM/SW Contact:    Eileen Stanford, LCSW Phone Number: 03/02/2019, 11:02 AM  Clinical Narrative:  Pt is alert and oriented. Pt lives with spouse. Pt has refused HH stating "I don't need that." Pt has been clipped. Pt will get HD T, TH, S. Pt states he has transportation to HD. Pt states he has a established PCP. Pt states he is able to get him meds.   High risk readmission screening completed.                 Expected Discharge Plan: Home/Self Care Barriers to Discharge: Continued Medical Work up   Patient Goals and CMS Choice Patient states their goals for this hospitalization and ongoing recovery are:: "to go home" CMS Medicare.gov Compare Post Acute Care list provided to:: Patient Choice offered to / list presented to : NA  Expected Discharge Plan and Services Expected Discharge Plan: Home/Self Care In-house Referral: NA Discharge Planning Services: NA Post Acute Care Choice: NA Living arrangements for the past 2 months: Single Family Home                           HH Arranged: NA          Prior Living Arrangements/Services Living arrangements for the past 2 months: Single Family Home Lives with:: Spouse, Adult Children Patient language and need for interpreter reviewed:: Yes Do you feel safe going back to the place where you live?: Yes      Need for Family Participation in Patient Care: Yes (Comment) Care giver support system in place?: Yes (comment)   Criminal Activity/Legal Involvement Pertinent to Current Situation/Hospitalization: No - Comment as needed  Activities of Daily Living Home Assistive Devices/Equipment: Cane (specify quad or straight)(straight) ADL Screening (condition at time of admission) Patient's cognitive ability adequate to safely complete daily activities?: Yes Is the patient  deaf or have difficulty hearing?: No Does the patient have difficulty seeing, even when wearing glasses/contacts?: No Does the patient have difficulty concentrating, remembering, or making decisions?: No Patient able to express need for assistance with ADLs?: Yes Does the patient have difficulty dressing or bathing?: No Independently performs ADLs?: Yes (appropriate for developmental age) Does the patient have difficulty walking or climbing stairs?: Yes Weakness of Legs: Both Weakness of Arms/Hands: None  Permission Sought/Granted Permission sought to share information with : Family Supports    Share Information with NAME: Helene Kelp     Permission granted to share info w Relationship: Spouse     Emotional Assessment Appearance:: Appears stated age Attitude/Demeanor/Rapport: Engaged Affect (typically observed): Accepting, Appropriate Orientation: : Oriented to Self, Oriented to Place, Oriented to  Time, Oriented to Situation Alcohol / Substance Use: Not Applicable Psych Involvement: No (comment)  Admission diagnosis:  Acute on chronic combined systolic and diastolic CHF (congestive heart failure) ([place]) [I50.43] Patient Active Problem List   Diagnosis Date Noted  . ESRD on dialysis ([place])   . Palliative care by specialist   . Acute on chronic combined systolic and diastolic CHF (congestive heart failure) ([place]) 02/13/2019  . Systolic congestive heart failure ([place]) 01/02/2019  . Acute kidney injury superimposed on chronic kidney disease ([place]) 01/02/2019  . Chronic respiratory failure ([place]) 07/21/2018  . Degenerative arthritis of knee, bilateral 13-Dec-202019  . Essential hypertension 10/01/2017  . DNR (do not  resuscitate) discussion 08/07/2017  . Obesity (BMI 35.0-39.9 without comorbidity) 08/03/2017  . Dyslipidemia 08/03/2017  . CKD (chronic kidney disease), stage III 08/03/2017  . Ischemic chest pain ([place]) 07/27/2017  . Hypoxia 09/22/2016  . Tear of MCL (medial collateral ligament)  of knee, left, initial encounter 08/21/2016  . Degenerative arthritis of left knee 08/21/2016  . Morbid obesity ([place]) 12/05/2014  . Type 2 diabetes mellitus ([place]) 11/21/2014  . OSA (obstructive sleep apnea) 11/21/2014  . Encounter for therapeutic drug monitoring 06/23/2013  . PAF (paroxysmal atrial fibrillation) ([place]) 12/30/2012  . Nonischemic cardiomyopathy ([place])   . Ventricular tachycardia ([place]) 11/16/2012  . Hearing loss, left 03/18/2012  . Dizziness 12/22/2011  . Obesity 01/16/2011  . Long term (current) use of anticoagulants 08/05/2010  . Automatic implantable cardioverter-defibrillator in situ 08/15/2009  . FATIGUE 06/17/2009  . Coronary atherosclerosis 10/11/2008  . Paroxysmal ventricular tachycardia ([place]) 08/28/2008  . Atrial fibrillation ([place]) 08/28/2008  . History of cardiovascular disorder 12/06/2007  . Hypertensive heart disease 05/09/2007  . LIPOMA 05/06/2007  . Hyperlipidemia 05/06/2007  . Gout 05/06/2007  . Chronic combined systolic and diastolic CHF (congestive heart failure) ([place]) 05/06/2007  . ACUTE BUT ILL-DEFINED CEREBROVASCULAR DISEASE 05/06/2007  . DENTAL CARIES 05/06/2007   PCP:  Biagio Borg, MD Pharmacy:   [id number] [place], [place] [address]  [place] [address] Phone: [phone] Fax: [phone]  [place], [facility] [address] [place] [address] [place] [address] Phone: [phone] Fax: [place], [place] - [address] [place] [address] Phone: [phone] Fax: [phone]     Social Determinants of Health (SDOH) Interventions    Readmission Risk Interventions No flowsheet data found.

## 2019-03-02 NOTE — Anesthesia Postprocedure Evaluation (Signed)
Anesthesia Post Note  Patient: [name]  Procedure(s) Performed: Arteriovenous (Av) Fistula Creation right upper arm (Right Arm Upper)     Patient location during evaluation: PACU Anesthesia Type: MAC Level of consciousness: awake and alert Pain management: pain level controlled Vital Signs Assessment: post-procedure vital signs reviewed and stable Respiratory status: spontaneous breathing, nonlabored ventilation, respiratory function stable and patient connected to nasal cannula oxygen Cardiovascular status: stable and blood pressure returned to baseline Postop Assessment: no apparent nausea or vomiting Anesthetic complications: no    Last Vitals:  Vitals:   03/02/19 1705 03/02/19 1715  BP: 103/81 101/79  Pulse: 93 93  Resp: (!) 24 (!) 22  Temp:  (!) 36.1 C  SpO2: 92% 91%    Last Pain:  Vitals:   03/02/19 1650  TempSrc:   PainSc: [name]

## 2019-03-02 NOTE — Care Management Important Message (Signed)
Important Message  Patient Details  Name: [name] MRN: [id number] Date of Birth: 11-30-1955   Medicare Important Message Given:  Yes     Shelda Altes 03/02/2019, 9:05 AM

## 2019-03-02 NOTE — Anesthesia Preprocedure Evaluation (Addendum)
Anesthesia Evaluation  Patient identified by MRN, date of birth, ID band Patient awake    Reviewed: Allergy & Precautions, NPO status , Patient's Chart, lab work & pertinent test results  Airway Mallampati: II  TM Distance: >3 FB Neck ROM: Full    Dental  (+) Teeth Intact, Dental Advisory Given   Pulmonary sleep apnea , former smoker,    breath sounds clear to auscultation       Cardiovascular hypertension, Pt. on home beta blockers + CAD and +CHF  + dysrhythmias Atrial Fibrillation + Cardiac Defibrillator  Rhythm:Regular Rate:Normal     Neuro/Psych CVA negative psych ROS   GI/Hepatic negative GI ROS, Neg liver ROS,   Endo/Other  diabetes, Type 2, Insulin Dependent  Renal/GU ESRF and DialysisRenal disease     Musculoskeletal   Abdominal Normal abdominal exam  (+)   Peds  Hematology   Anesthesia Other Findings   Reproductive/Obstetrics                            Anesthesia Physical Anesthesia Plan  ASA: III  Anesthesia Plan: MAC   Post-op Pain Management:    Induction: Intravenous  PONV Risk Score and Plan: Propofol infusion and Ondansetron  Airway Management Planned: Natural Airway and Simple Face Mask  Additional Equipment: None  Intra-op Plan:   Post-operative Plan:   Informed Consent: I have reviewed the patients History and Physical, chart, labs and discussed the procedure including the risks, benefits and alternatives for the proposed anesthesia with the patient or authorized representative who has indicated his/her understanding and acceptance.       Plan Discussed with: CRNA  Anesthesia Plan Comments:        Anesthesia Quick Evaluation

## 2019-03-03 ENCOUNTER — Ambulatory Visit: Payer: Medicare Other

## 2019-03-03 ENCOUNTER — Encounter (HOSPITAL_COMMUNITY): Payer: Self-pay | Admitting: Vascular Surgery

## 2019-03-03 DIAGNOSIS — N186 End stage renal disease: Secondary | ICD-10-CM | POA: Diagnosis not present

## 2019-03-03 DIAGNOSIS — I5043 Acute on chronic combined systolic (congestive) and diastolic (congestive) heart failure: Secondary | ICD-10-CM | POA: Diagnosis not present

## 2019-03-03 DIAGNOSIS — Z992 Dependence on renal dialysis: Secondary | ICD-10-CM

## 2019-03-03 LAB — RENAL FUNCTION PANEL
Albumin: 3.1 g/dL — ABNORMAL LOW (ref 3.5–5.0)
Anion gap: 15 (ref 5–15)
BUN: 41 mg/dL — ABNORMAL HIGH (ref 8–23)
CO2: 23 mmol/L (ref 22–32)
Calcium: 9.5 mg/dL (ref 8.9–10.3)
Chloride: 97 mmol/L — ABNORMAL LOW (ref 98–111)
Creatinine, Ser: 5.36 mg/dL — ABNORMAL HIGH (ref 0.61–1.24)
GFR calc Af Amer: 12 mL/min — ABNORMAL LOW (ref >60)
GFR calc non Af Amer: 11 mL/min — ABNORMAL LOW (ref >60)
Glucose, Bld: 101 mg/dL — ABNORMAL HIGH (ref 70–99)
Phosphorus: 4.8 mg/dL — ABNORMAL HIGH (ref 2.5–4.6)
Potassium: 3.9 mmol/L (ref 3.5–5.1)
Sodium: 135 mmol/L (ref 135–145)

## 2019-03-03 LAB — HEPATITIS B SURFACE ANTIBODY,QUALITATIVE: Hep B S Ab: NONREACTIVE

## 2019-03-03 LAB — GLUCOSE, CAPILLARY: Glucose-Capillary: 80 mg/dL (ref 70–99)

## 2019-03-03 LAB — CBC
HCT: 40.5 % (ref 39.0–52.0)
HCT: 41 % (ref 39.0–52.0)
Hemoglobin: 12.8 g/dL — ABNORMAL LOW (ref 13.0–17.0)
Hemoglobin: 13.4 g/dL (ref 13.0–17.0)
MCH: 31.7 pg (ref 26.0–34.0)
MCH: 32 pg (ref 26.0–34.0)
MCHC: 31.6 g/dL (ref 30.0–36.0)
MCHC: 32.7 g/dL (ref 30.0–36.0)
MCV: 100.2 fL — ABNORMAL HIGH (ref 80.0–100.0)
MCV: 97.9 fL (ref 80.0–100.0)
Platelets: 79 10*3/uL — ABNORMAL LOW (ref 150–400)
Platelets: 60 10*3/uL — ABNORMAL LOW (ref 150–400)
RBC: 4.04 MIL/uL — ABNORMAL LOW (ref 4.22–5.81)
RBC: 4.19 MIL/uL — ABNORMAL LOW (ref 4.22–5.81)
RDW: 15.3 % (ref 11.5–15.5)
RDW: 15.3 % (ref 11.5–15.5)
WBC: 7.4 10*3/uL (ref 4.0–10.5)
WBC: 9.4 10*3/uL (ref 4.0–10.5)
nRBC: 0 % (ref 0.0–0.2)
nRBC: 0.2 % (ref 0.0–0.2)

## 2019-03-03 LAB — PROTIME-INR
INR: 1.5 — ABNORMAL HIGH (ref 0.8–1.2)
Prothrombin Time: 18 seconds — ABNORMAL HIGH (ref 11.4–15.2)

## 2019-03-03 LAB — HEPATITIS C ANTIBODY: HCV Ab: NONREACTIVE

## 2019-03-03 LAB — HEPATITIS B SURFACE ANTIGEN: Hepatitis B Surface Ag: NONREACTIVE

## 2019-03-03 LAB — MAGNESIUM: Magnesium: 1.8 mg/dL (ref 1.7–2.4)

## 2019-03-03 LAB — HEPATITIS B CORE ANTIBODY, TOTAL: Hep B Core Total Ab: NONREACTIVE

## 2019-03-03 MED ORDER — WARFARIN SODIUM 7.5 MG PO TABS: 7.5000 mg | ORAL_TABLET | Freq: Once | ORAL | Status: DC

## 2019-03-03 MED ORDER — AMIODARONE HCL 200 MG PO TABS: 200.0000 mg | ORAL_TABLET | Freq: Every day | ORAL | 3 refills | Status: DC

## 2019-03-03 MED ORDER — HEPARIN SODIUM (PORCINE) 1000 UNIT/ML IJ SOLN
INTRAMUSCULAR | Status: AC
  Filled 2019-03-03: qty 1

## 2019-03-03 MED ORDER — WARFARIN SODIUM 5 MG PO TABS: 5.0000 mg | ORAL_TABLET | Freq: Once | ORAL | 5 refills | Status: AC

## 2019-03-03 MED ORDER — MIDODRINE HCL 10 MG PO TABS: 10.0000 mg | ORAL_TABLET | Freq: Three times a day (TID) | ORAL | 0 refills | Status: AC

## 2019-03-03 NOTE — Plan of Care (Signed)
  Problem: Education: Goal: Knowledge of General Education information will improve Description: Including pain rating scale, medication(s)/side effects and non-pharmacologic comfort measures Outcome: Progressing   Problem: Health Behavior/Discharge Planning: Goal: Ability to manage health-related needs will improve Outcome: Progressing   Problem: Activity: Goal: Risk for activity intolerance will decrease Outcome: Progressing   Problem: Pain Managment: Goal: General experience of comfort will improve Outcome: Progressing   Problem: Safety: Goal: Ability to remain free from injury will improve Outcome: Progressing   Gerarda Fraction, RN

## 2019-03-03 NOTE — Progress Notes (Signed)
Patient ID: [name], male   DOB: 07/04/1955, 63 y.o.   MRN: [id number]     Advanced Heart Failure Rounding Note  PCP-Cardiologist: Minus Breeding, MD  [facility]: Dr. Haroldine Laws   Subjective:    Feels great. Weight down to 222. CVP 4. No orthopnea, PND.   Objective:   Weight Range: 101.1 kg Body mass index is 31.97 kg/m.   Vital Signs:   Temp:  [97 F (36.1 C)-98 F (36.7 C)] 97.8 F (36.6 C) (10/30 0512) Pulse Rate:  [45-95] 86 (10/30 0512) Resp:  [18-24] 20 (10/30 0512) BP: (75-115)/(36-85) 98/80 (10/30 0512) SpO2:  [89 %-100 %] 98 % (10/30 0512) Weight:  [101.1 kg-104.6 kg] 101.1 kg (10/30 0530) Last BM Date: 02/27/19  Weight change: Filed Weights   03/03/19 0124 03/03/19 0504 03/03/19 0530  Weight: 104.6 kg 102.6 kg 101.1 kg    Intake/Output:   Intake/Output Summary (Last 24 hours) at 03/03/2019 1029 Last data filed at 03/03/2019 0845 Gross per 24 hour  Intake 912 ml  Output 2325 ml  Net -1413 ml      Physical Exam   PHYSICAL EXAM: General:  Lying in bed . No resp difficulty HEENT: normal Neck: supple.RIJ TLC  Carotids 2+ bilat; no bruits. No lymphadenopathy or thryomegaly appreciated. Cor: PMI nondisplaced. Regular rate & rhythm. No rubs, gallops or murmurs. R chest TDC Lungs: clear Abdomen: soft, nontender, nondistended. No hepatosplenomegaly. No bruits or masses. Good bowel sounds. Extremities: no cyanosis, clubbing, rash, edema +AVF Neuro: alert & orientedx3, cranial nerves grossly intact. moves all 4 extremities w/o difficulty. Affect pleasant   Telemetry   NSR 80-90 w/ PVCs. No NSVT  Personally reviewed    Labs    CBC Recent Labs    03/02/19 2310 03/03/19 0634  WBC 7.4 9.4  HGB 12.8* 13.4  HCT 40.5 41.0  MCV 100.2* 97.9  PLT 79* 60*   Basic Metabolic Panel Recent Labs    03/02/19 0500 03/02/19 0800 03/02/19 2310 03/03/19 0634  NA  --  135 135  --   K  --  3.8 3.9  --   CL  --  96* 97*  --   CO2  --  25 23  --   GLUCOSE   --  103* 101*  --   BUN  --  32* 41*  --   CREATININE  --  5.06* 5.36*  --   CALCIUM  --  9.1 9.5  --   MG 1.9  --   --  1.8  PHOS  --   --  4.8*  --    Liver Function Tests Recent Labs    03/02/19 2310  ALBUMIN 3.1*   No results for input(s): LIPASE, AMYLASE in the last 72 hours. Cardiac Enzymes No results for input(s): CKTOTAL, CKMB, CKMBINDEX, TROPONINI in the last 72 hours.  BNP: BNP (last 3 results) Recent Labs    07/19/18 1106 01/02/19 1552 02/13/19 1114  BNP 1,608.5* 2,154.7* 2,637.3*    ProBNP (last 3 results) Recent Labs    11/01/18 1229  PROBNP 2,158.0*     D-Dimer No results for input(s): DDIMER in the last 72 hours. Hemoglobin A1C No results for input(s): HGBA1C in the last 72 hours. Fasting Lipid Panel No results for input(s): CHOL, HDL, LDLCALC, TRIG, CHOLHDL, LDLDIRECT in the last 72 hours. Thyroid Function Tests No results for input(s): TSH, T4TOTAL, T3FREE, THYROIDAB in the last 72 hours.  Invalid input(s): FREET3  Other results:   Imaging  No results found.   Medications:     Scheduled Medications: . amiodarone  200 mg Oral Daily  . atorvastatin  80 mg Oral q1800  . Chlorhexidine Gluconate Cloth  6 each Topical Daily  . Chlorhexidine Gluconate Cloth  6 each Topical Q0600  . Chlorhexidine Gluconate Cloth  6 each Topical Q0600  . chlorproMAZINE  25 mg Oral TID  . heparin      . midodrine  10 mg Oral TID WC  . sodium chloride flush  10 mL Intravenous Q12H  . sodium chloride flush  3 mL Intravenous Q12H  . warfarin  7.5 mg Oral ONCE-1800  . Warfarin - Pharmacist Dosing Inpatient   Does not apply q1800    Infusions: . sodium chloride    . sodium chloride    . sodium chloride    . sodium chloride    . sodium chloride    . sodium chloride    . sodium chloride 10 mL/hr at 03/02/19 1348    PRN Medications: sodium chloride, sodium chloride, sodium chloride, sodium chloride, sodium chloride, sodium chloride, acetaminophen,  alteplase, heparin, lidocaine (PF), lidocaine-prilocaine, loperamide, ondansetron (ZOFRAN) IV, oxyCODONE-acetaminophen, pentafluoroprop-tetrafluoroeth, sodium chloride flush, sodium chloride flush    Patient Profile   63 y/o male with severe systolic HF with biventricular dysfunction due to NICM and advanced CKD. Recently hospitalized with low output HF. Now returns with progressive NYHA IV symptoms with marked fluid overload not responding to titration of oral diuretic regimen. With CKD 4, biventricular HF and morbid obesity advanced options are quite limited.  Assessment/Plan    1. Acute on Chronic Systolic HF with biventricular dysfunction due to NICM:  - volume improved with HD - BP soft. - stop Bidil, sildenafil, diuretics, carvedilol and kcl   2. Acute on CKD stage 3 - Now s/p HD catheter plaecment and AVF.Started HD on 10/23. Tolerating well. - weight down 50 pounds  - Nephrology appreciated - Will need permanent HD access and long-term HD.  - VVS consulted for AVF. Vein mapping completed. Plan for AVF today  - Will need to arrange for outpatient HD  3. CAD - LHC8/20with LAD 20%50% distal left circ and 20% mid RCA stenosis. No significant disease in left main or LAD. -  Hs Trop 284 but likely demand ischemia in the setting of a/c CHF and abnormal renal function.   - Continue statin. - Hold ? blocker due to soft BP/ concern for low output - No ASA due to chronic coumadin  4. PAF  -s/p recent DC-CV for AFL.  - Continue amio 200 mg bid (had recent VT), he is in NSR.    - Coumadin held for AVF. Now restarted INR 1.5.   6. VT -Has St Jude ICD.  -Had VT episode (rate 190 bpm) w/ delivery of shock on 02/07/19  -Started on IV amiodarone 10/14 w/ improvement, NSVT less frequent off milrinone.  - Continue amiodarone 200 mg po once a day.  -Keep K >4.0 and Mg > 2.0 .  7. Pulmonary HTN - On home O2 (2L).  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  Probable component of WHO Group 3  (OSA). Had negative V/Q scan ruling out CTEPH.  - RHC PVR 6.5. Was started on sildenafil 20 mg three times a day.  - Now off sildenafil due to low BP   8. Hypokalemia: -3.9   9. Thrombocytopenia - PLTs down to 60   - HIT antibody WNL.  -Continue to monitor plt ct. No bleeding  10. Deconditioning - PT has seen. Refuses HHPT   Home today. Has outpatient HD arranged. CLIP complete.   Stop diuretics, carvedilol, sildenafil, Bidil and kdur  Length of Stay: [place], MD  03/03/2019, 10:29 AM  Advanced Heart Failure Team Pager [phone] (M-F; 7a - 4p)  Please contact [facility] for night-coverage after hours (4p -7a ) and weekends on [email or web address]  Patient seen and examined with the above-signed Advanced Practice Provider and/or Housestaff. I personally reviewed laboratory data, imaging studies and relevant notes. I independently examined the patient and formulated the important aspects of the plan. I have edited the note to reflect any of my changes or salient points. I have personally discussed the plan with the patient and/or family.  Volume status stable on HD. For AVF today. INR 1.6. Will resume coumadin tonight after surgery. No need for heparin bridge. PLTs remain low. HIT WNL. Stable for d/c from cardiac perspective when Renal issues worked out.   Glori Bickers, MD  10:29 AM

## 2019-03-03 NOTE — TOC Transition Note (Signed)
Transition of Care [facility]) - CM/SW Discharge Note   Patient Details  Name: [name] MRN: [id number] Date of Birth: 1955-07-13  Transition of Care [facility]) CM/SW Contact:  Eileen Stanford, LCSW Phone Number: 03/03/2019, 12:41 PM   Clinical Narrative:   Rolling walker has been ordered through [employer]. They will deliever walker to the room. Pt will d/c home.    Final next level of care: Home/Self Care Barriers to Discharge: No Barriers Identified   Patient Goals and CMS Choice Patient states their goals for this hospitalization and ongoing recovery are:: "to go home" CMS Medicare.gov Compare Post Acute Care list provided to:: Patient Choice offered to / list presented to : NA  Discharge Placement                       Discharge Plan and Services In-house Referral: NA Discharge Planning Services: NA Post Acute Care Choice: NA          DME Arranged: Walker rolling DME Agency: [employer] Date DME Agency Contacted: 03/03/19 Time DME Agency Contacted: [id number] Representative spoke with at DME Agency: [place]: NA          Social Determinants of Health ([place]) Interventions     Readmission Risk Interventions No flowsheet data found.

## 2019-03-03 NOTE — Progress Notes (Signed)
Renal Navigator notified that patient will discharge today. Renal Navigator met with patient at bedside to follow up to ensure he knows plan to complete paperwork today at OP HD clinic/[facility] in order to start tomorrow. He recalled conversation from 2 days ago and is prepared to report to clinic between 2-4pm today to sign paperwork and will arrive to clinic tomorrow at 12:40pm for his first treatment. Renal Navigator informed clinic and asked Renal PA/S. Collins to send orders. Patient is cleared for discharged from OP HD standpoint.  Alphonzo Cruise, [employer] Renal Navigator [phone]

## 2019-03-03 NOTE — Progress Notes (Signed)
ANTICOAGULATION CONSULT NOTE - Follow Up Consult  Pharmacy Consult for Warfarin  Indication: atrial fibrillation  No Active Allergies  Patient Measurements: Height: 5\' 10"  (177.8 cm) Weight: 222 lb 12.8 oz (101.1 kg) IBW/kg (Calculated) : 73  Heparin dosing weight: 101 kg  Vital Signs: Temp: 97.8 F (36.6 C) (10/30 0512) Temp Source: Oral (10/30 0512) BP: 98/80 (10/30 0512) Pulse Rate: 86 (10/30 0512)  Labs: Recent Labs    03/01/19 0500 03/02/19 0500 03/02/19 0800 03/02/19 2310 03/03/19 0634  HGB 13.4 13.5  --  12.8* 13.4  HCT 42.4 43.0  --  40.5 41.0  PLT 76* 68*  --  79* 60*  LABPROT 21.3* 18.8*  --   --  18.0*  INR 1.9* 1.6*  --   --  1.5*  CREATININE 4.87*  --  5.06* 5.36*  --     Estimated Creatinine Clearance: 17 mL/min (A) (by C-G formula based on SCr of 5.36 mg/dL (H)).   Medical History: Past Medical History:  Diagnosis Date  . CAD (coronary artery disease)    a. Initial nonobst by cath 2009. b. Cath 01/2013 in setting of VT storm: obstructive distal Cx disease (small and terminates in the AV groove, unlikely to cause significant ischemia or electrical instability), nonobstructive RCA disease, EF 15-20%.   . Cerebrovascular accident [facility])    a. 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 Chronic systolic CHF (congestive heart failure) ([place])    a. Likely NICM (out of proportion to CAD). b. 2009 - EF 25-30% by echo, 01/2013: 15-20% by cath. c. 11/2014 Echo: EF 35-40%, Gr1 DD, mild MR, mod TR, PASP 35mmHg.  . CKD (chronic kidney disease), stage II   . Dyslipidemia   . Gout   . HTN (hypertension)   . Hypokalemia   . ICD (implantable cardiac defibrillator) in place   . Insulin dependent diabetes mellitus   . Lipoma   . Nonischemic cardiomyopathy ([place])    a.  11/2014 Echo: EF 35-40%, Gr1 DD, mild MR, mod TR, PASP 25mmHg.  . OSA (obstructive sleep apnea)    does not wear cpap  . PAF (paroxysmal atrial fibrillation) ([place])    a. Noted 05/2008 by EKG;  b. CHA2DS2VASc =  5-6-->coumadin.  . Paroxysmal VT ([place])    a. s/p St. Jude ICD 2007. b. H/o paroxysmal VT/VF including VT storm 12/2012 admission prompting amio initiation;  c. 01/2013 ICD upgrade SJM [id number] Ellipse VR single lead ICD.  [name] Pulmonary HTN ([place])    a. Mild by cath 01/2013.    Medications:  Scheduled:  . amiodarone  200 mg Oral Daily  . atorvastatin  80 mg Oral q1800  . Chlorhexidine Gluconate Cloth  6 each Topical Daily  . Chlorhexidine Gluconate Cloth  6 each Topical Q0600  . Chlorhexidine Gluconate Cloth  6 each Topical Q0600  . chlorproMAZINE  25 mg Oral TID  . heparin      . midodrine  10 mg Oral TID WC  . sodium chloride flush  10 mL Intravenous Q12H  . sodium chloride flush  3 mL Intravenous Q12H  . warfarin  7.5 mg Oral ONCE-1800  . Warfarin - Pharmacist Dosing Inpatient   Does not apply q1800   Infusions:  . sodium chloride    . sodium chloride    . sodium chloride    . sodium chloride    . sodium chloride    . sodium chloride    . sodium chloride 10 mL/hr at 03/02/19 1348  Assessment: Pt is a 63 y/o male with PMH of CHF (EF 20-25%), prior VT s/p St Jude ICD, CAD, CVA, OSA on CPAP, pulmonary HTN, HTN, DM, CKDs stage III, and PAF on coumadin PTA (5mg  daily, except 2.5 mg on fridays). Pharmacy has been consulted to dose warfarin s/p HD catheter placed 10/23 and then fistula placed 10/29.  INR today sub-therapeutic 1.5 - Warfarin has been on hold for procedures - restarted last pm. No overt bleeding or complications noted - but oozing from HD cath site over weekend and early this week. H/H stable  PLTC fell from 150-200s to 60-70s - stopped all heparin and sent HIT antibody - negative.  PLTC remains low - will not bridge with heparin  Goal of Therapy:  INR 2-3 Monitor platelets by anticoagulation protocol: Yes   Plan:  -Warfarin 7.5mg  po x1 tonight and if DC recommend tomorrow too then resume PTA warfarin dose 5mg  daily. -Daily protime   Bonnita Nasuti Pharm.D. CPP,  BCPS Clinical Pharmacist [phone] 03/03/2019 10:09 AM

## 2019-03-03 NOTE — Progress Notes (Signed)
[facility] ROUNDING NOTE   Subjective:   63 year old gentleman with history of cardiomyopathy and advanced chronic kidney disease felt acute on chronic kidney insufficiency secondary to suspected cardiorenal syndrome.  Patient appears to be agreeable for AV fistula placement and is now deemed end-stage renal disease.  Is a history of acute on chronic systolic heart failure with biventricular dysfunction cardiac catheterization showed nonobstructive coronary artery disease left heart catheterization 8/20.  It was not felt that he had cardiac amyloid.  Status post St. Jude's ICD.  Echo showed ejection fraction 20 to 25% July 2020.  He had poor response to high-dose IV diuretics and was initiated on hemodialysis.  Patient was seen on dialysis 03/03/2019 with 2.1 L removed.  He is scheduled for discharge.  He underwent  right upper extremity AV graft 03/02/2019.  Initiated dialysis 02/24/2019.  Blood pressure 96/74 pulse 87 temperature 97.8 O2 sats 98% 2 L  Amiodarone 200 mg twice daily atorvastatin 80 mg daily,  , midodrine 10 mg 3 times daily,     Objective:  Vital signs in last 24 hours:  Temp:  [97 F (36.1 C)-98 F (36.7 C)] 97.8 F (36.6 C) (10/30 0512) Pulse Rate:  [45-95] 86 (10/30 0512) Resp:  [18-24] 20 (10/30 0512) BP: (75-115)/(36-85) 98/80 (10/30 0512) SpO2:  [89 %-100 %] 98 % (10/30 0512) Weight:  [101.1 kg-104.6 kg] 101.1 kg (10/30 0530)  Weight change: -0.057 kg Filed Weights   03/03/19 0124 03/03/19 0504 03/03/19 0530  Weight: 104.6 kg 102.6 kg 101.1 kg    Intake/Output: I/O last 3 completed shifts: In: 902 [P.O.:360; I.V.:392; IV Piggyback:150] Out: 2400 [Urine:350; Other:2000; Blood:50]   Intake/Output this shift:  Total I/O In: 10 [I.V.:10] Out: -   General:  AAOx3 NAD HEENT: MMM [place] AT anicteric sclera Neck:  No JVD, no adenopathy CV:  Heart RRR  Lungs:  L/S CTA bilaterally Abd:  abd SNT/ND with normal BS GU:  Bladder  non-palpable Extremities: Trace pitting edema Skin:  No skin rash   Basic Metabolic Panel: Recent Labs  Lab 02/27/19 0500 02/28/19 0500 03/01/19 0500 03/02/19 0500 03/02/19 0800 03/02/19 2310 03/03/19 0634  NA 137 134* 135  --  135 135  --   K 3.8 3.8 3.7  --  3.8 3.9  --   CL 95* 94* 95*  --  96* 97*  --   CO2 29 26 26   --  25 23  --   GLUCOSE 91 113* 105*  --  103* 101*  --   BUN 40* 26* 38*  --  32* 41*  --   CREATININE 3.86* 3.86* 4.87*  --  5.06* 5.36*  --   CALCIUM 9.6 9.3 9.3  --  9.1 9.5  --   MG 1.9 1.8 1.8 1.9  --   --  1.8  PHOS  --   --   --   --   --  4.8*  --     Liver Function Tests: Recent Labs  Lab 03/02/19 2310  ALBUMIN 3.1*   No results for input(s): LIPASE, AMYLASE in the last 168 hours. No results for input(s): AMMONIA in the last 168 hours.  CBC: Recent Labs  Lab 02/28/19 0925 03/01/19 0500 03/02/19 0500 03/02/19 2310 03/03/19 0634  WBC 8.9 8.6 7.8 7.4 9.4  HGB 13.7 13.4 13.5 12.8* 13.4  HCT 42.0 42.4 43.0 40.5 41.0  MCV 98.4 98.4 99.3 100.2* 97.9  PLT 62* 76* 68* 79* 60*    Cardiac Enzymes:  No results for input(s): CKTOTAL, CKMB, CKMBINDEX, TROPONINI in the last 168 hours.  BNP: Invalid input(s): POCBNP  CBG: Recent Labs  Lab 03/02/19 1116 03/02/19 1314 03/02/19 1610 03/02/19 2133 03/03/19 0757  GLUCAP 111* 97 94 114* 80    Microbiology: Results for orders placed or performed during the hospital encounter of 02/13/19  SARS CORONAVIRUS 2 (TAT 6-24 HRS) Nasopharyngeal Nasopharyngeal Swab     Status: None   Collection Time: 02/13/19  1:34 PM   Specimen: Nasopharyngeal Swab  Result Value Ref Range Status   SARS Coronavirus 2 NEGATIVE NEGATIVE Final    Comment: (NOTE) SARS-CoV-2 target nucleic acids are NOT DETECTED. The SARS-CoV-2 RNA is generally detectable in upper and lower respiratory specimens during the acute phase of infection. Negative results do not preclude SARS-CoV-2 infection, do not rule out co-infections  with other pathogens, and should not be used as the sole basis for treatment or other patient management decisions. Negative results must be combined with clinical observations, patient history, and epidemiological information. The expected result is Negative. Fact Sheet for Patients: SugarRoll.be Fact Sheet for Healthcare Providers: https://www.woods-mathews.com/ This test is not yet approved or cleared by the [place] FDA and  has been authorized for detection and/or diagnosis of SARS-CoV-2 by FDA under an Emergency Use Authorization (EUA). This EUA will remain  in effect (meaning this test can be used) for the duration of the COVID-19 declaration under Section 56 4(b)(1) of the Act, 21 U.S.C. section 360bbb-3(b)(1), unless the authorization is terminated or revoked sooner. Performed at [facility], [place] [address], [place],  [address]     Coagulation Studies: Recent Labs    03/01/19 0500 03/02/19 0500 03/03/19 0634  LABPROT 21.3* 18.8* 18.0*  INR 1.9* 1.6* 1.5*    Urinalysis: No results for input(s): COLORURINE, LABSPEC, PHURINE, GLUCOSEU, HGBUR, BILIRUBINUR, KETONESUR, PROTEINUR, UROBILINOGEN, NITRITE, LEUKOCYTESUR in the last 72 hours.  Invalid input(s): APPERANCEUR    Imaging: No results found.   Medications:   . sodium chloride    . sodium chloride    . sodium chloride    . sodium chloride    . sodium chloride    . sodium chloride    . sodium chloride 10 mL/hr at 03/02/19 1348   . amiodarone  200 mg Oral Daily  . atorvastatin  80 mg Oral q1800  . Chlorhexidine Gluconate Cloth  6 each Topical Daily  . Chlorhexidine Gluconate Cloth  6 each Topical Q0600  . Chlorhexidine Gluconate Cloth  6 each Topical Q0600  . chlorproMAZINE  25 mg Oral TID  . heparin      . midodrine  10 mg Oral TID WC  . sodium chloride flush  10 mL Intravenous Q12H  . sodium chloride flush  3 mL Intravenous Q12H  . warfarin   7.5 mg Oral ONCE-1800  . Warfarin - Pharmacist Dosing Inpatient   Does not apply q1800   sodium chloride, sodium chloride, sodium chloride, sodium chloride, sodium chloride, sodium chloride, acetaminophen, alteplase, heparin, lidocaine (PF), lidocaine-prilocaine, loperamide, ondansetron (ZOFRAN) IV, oxyCODONE-acetaminophen, pentafluoroprop-tetrafluoroeth, sodium chloride flush, sodium chloride flush  Assessment/ Plan:   Chronic kidney disease stage IV baseline serum creatinine high twos.  Acute on chronic kidney disease initiated dialysis treatment third treatment was 02/27/2019.  Clip process [facility] he will be on a Tuesday Thursday Saturday schedule.  Appreciate assistance from Dr. Juanda Crumble [facility] vascular Now deemed to be end-stage renal disease.  W .  Bleeding noted from dialysis catheter site pursestring  suture placed per interventional radiology 02/28/2019.  Plan for placement of AV graft 03/02/2019.  Dialysis 03/03/2019 plan discharge [facility] should be Tuesday Thursday Saturday dialysis.  Discussed with Dr. Haroldine Laws and Terri Piedra renal navigator  Hypertension/volume continue to ultrafilter with dialysis.  Chronic hypotension being managed with midodrine.  Metabolic bone disease phosphorus remained stable at this time.  PTH 141  Anemia does not appear to be an issue at this point.  Systolic heart failure with ejection fraction 20 to 25%  Coronary artery disease continue statin and chronic Coumadin  Paroxysmal atrial fibrillation amiodarone chronic Coumadin therapy being held by vascular surgery  History of V. tach status post St. Jude's ICD  History of pulmonary hypertension on chronic O2  Patient is stable for discharge from nephrology standpoint   LOS: [place] @TODAY @10 :39 AM

## 2019-03-03 NOTE — Progress Notes (Signed)
  Progress Note    03/03/2019 8:34 AM 1 Day Post-Op  Subjective: No overnight issues having some right arm pain  Vitals:   03/03/19 0504 03/03/19 0512  BP: 115/61 98/80  Pulse: 64 86  Resp: (!) 22 20  Temp: 97.7 F (36.5 C) 97.8 F (36.6 C)  SpO2: 100% 98%    Physical Exam: Awake alert oriented Nonlabored respirations Right arm with strong palpable thrill and incision is clean dry intact Right hand well-perfused  CBC    Component Value Date/Time   WBC 9.4 03/03/2019 0634   RBC 4.19 (L) 03/03/2019 0634   HGB 13.4 03/03/2019 0634   HCT 41.0 03/03/2019 0634   PLT 60 (L) 03/03/2019 0634   MCV 97.9 03/03/2019 0634   MCH 32.0 03/03/2019 0634   MCHC 32.7 03/03/2019 0634   RDW 15.3 03/03/2019 0634   LYMPHSABS 0.5 (L) 01/04/2019 0429   MONOABS 0.6 01/04/2019 0429   EOSABS 0.0 01/04/2019 0429   BASOSABS 0.0 01/04/2019 0429    BMET    Component Value Date/Time   NA 135 03/02/2019 2310   NA 150 (H) 08/24/2017 1417   K 3.9 03/02/2019 2310   CL 97 (L) 03/02/2019 2310   CO2 23 03/02/2019 2310   GLUCOSE 101 (H) 03/02/2019 2310   BUN 41 (H) 03/02/2019 2310   BUN 15 08/24/2017 1417   CREATININE 5.36 (H) 03/02/2019 2310   CREATININE 1.37 (H) 12/17/2015 1209   CALCIUM 9.5 03/02/2019 2310   GFRNONAA 11 (L) 03/02/2019 2310   GFRAA 12 (L) 03/02/2019 2310    INR    Component Value Date/Time   INR 1.5 (H) 03/03/2019 0634   INR 3.0 07/04/2010 0949     Intake/Output Summary (Last 24 hours) at 03/03/2019 0834 Last data filed at 03/03/2019 3419 Gross per 24 hour  Intake 902 ml  Output 2325 ml  Net -1423 ml     Assessment/plan:  63 y.o. male is s/p right first stage basilic vein fistula creation.  Okay for discharge from vascular standpoint.  We will follow-up in office with dialysis duplex to evaluate for second stage procedure.   Brandon C. Donzetta Matters, MD [facility] Office: [phone] Pager: [phone]  03/03/2019 8:34 AM

## 2019-03-03 NOTE — Progress Notes (Signed)
Pt has been walking well with PT with RW and O2. Came to access ed and needs today. He declined ambulation as he is eager to d/c. He understood HF management, no questions, but would like a RW for home. Notified CSW. We discussed CRPII and pt thinks he would eventually like to do again but would like to contact [place] on his own. Gave him brochure with phone number.  [place], ACSM 11:18 AM 03/03/2019

## 2019-03-03 NOTE — Plan of Care (Signed)
?  Problem: Education: ?Goal: Knowledge of General Education information will improve ?Description: Including pain rating scale, medication(s)/side effects and non-pharmacologic comfort measures ?Outcome: Adequate for Discharge ?  ?Problem: Clinical Measurements: ?Goal: Ability to maintain clinical measurements within normal limits will improve ?Outcome: Adequate for Discharge ?Goal: Will remain free from infection ?Outcome: Adequate for Discharge ?Goal: Diagnostic test results will improve ?Outcome: Adequate for Discharge ?Goal: Respiratory complications will improve ?Outcome: Adequate for Discharge ?Goal: Cardiovascular complication will be avoided ?Outcome: Adequate for Discharge ?  ?Problem: Safety: ?Goal: Ability to remain free from injury will improve ?Outcome: Adequate for Discharge ?  ?

## 2019-03-03 NOTE — Discharge Summary (Addendum)
Advanced Heart Failure Team  Discharge Summary   Patient ID: [name] MRN: [id number], DOB/AGE: January 25, 1956 63 y.o. 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 Recently hospitalized with low output HF, who presented back to [facility] on 02/13/19 with progressive NYHA IV symptoms with marked fluid overload not responding to titration of oral diuretic regimen. On initial presentation, he was markedly volume overloaded. Wt up 10+ lb from home baseline. BNP 2600 on admit. Admit SCr 2.76. Device was interrogated and demonstrated 3 recent atp attempts followed by shock on 10/6 (6 days prior to admit).    He was admitted and placed on high dose diuretics w/ poor urinary response and worsening renal failure. He was started on inotropic support w/ milrinone w/ minimal improvement in UOP and only slight improvement in SCr. Given frequent NSVT, he was started on IV amiodarone. Milrinone ultimately discontinued due to refractory NSVT (up to 42 beats). Amiodarone was continued and VT resolved. Had [place] 02/16/19 w/ hemodynamics most c/w PAH and RV failure. RA 16, PA = 66/29 (47). Left sided filling pressures were ok. PCWP 11.  Fick cardiac output/index = 5.6/2.3. He was started on sildenafil 20 tid, but did not tolerate due to hypotension. Sildenafil discontinued. Midodrine added.   He continued to respond poorly to diuretics w/ increase in SCr into the 5 range and progression to ESRD, prompting nephrology consult and initiation of HD 02/24/2019. VVS consulted for AVF for permanent HD access. He responded well to HD w/ good volume removal. Total net wt loss this admit of 54 lb through diuresis+ dialysis.  HD c/b  hypotension. BP did not tolerate HF/ PH meds. Bidil, sildenafil, diuretics, carvedilol and kcl all discontinued. Discharged home on daily midodrine 10 mg tid. Also discharged home on PO amiodarone, coumadin and atorvastatin.   Nephrology arranged outpatient HD, T,Th,Sat. Discharge wt 222 lb.   On 10/30, he was seen and examined by Dr. Haroldine Laws and felt stable for discharge home.  [facility] f/u arranged.    [place] 02/16/19  Findings:  On milrinone 0.125  RA = 16 RV = 63/18 PA = 66/29 (47) PCW = 11 Fick cardiac output/index = 5.6/2.3 Thermo CO/CI = 4.6/1.9 PVR = 6.5 (Fick) 7.9 (therm) FA sat = 96% PA sat = 62%, 62%  Assessment:  1. Hemodynamics most c/w with PAH and RV failure. Left sided filling pressures are ok.     Discharge Weight Range: 222 lb Discharge Vitals: Blood pressure (!) 85/64, pulse 86, temperature 97.8 F (36.6 C), temperature source Oral, resp. rate 20, height 5\' 10"  (1.778 m), weight 101.1 kg, SpO2 98 %.  Labs: Lab Results  Component Value Date   WBC 9.4 03/03/2019   HGB 13.4 03/03/2019   HCT 41.0 03/03/2019   MCV 97.9 03/03/2019   PLT 60 (L) 03/03/2019    Recent Labs  Lab 03/02/19 2310  NA 135  K 3.9  CL 97*  CO2 23  BUN 41*  CREATININE 5.36*  CALCIUM 9.5  GLUCOSE 101*   Lab Results  Component Value Date   CHOL 137 02/08/2018   HDL 33.00 (L) 02/08/2018   LDLCALC 88 02/08/2018   TRIG 78.0 02/08/2018   BNP (last 3 results)  Recent Labs    07/19/18 1106 01/02/19 1552 02/13/19 1114  BNP 1,608.5* 2,154.7* 2,637.3*    ProBNP (last 3 results) Recent Labs    11/01/18 1229  PROBNP 2,158.0*     Diagnostic Studies/Procedures    [facility] 02/16/19  Findings:  On milrinone 0.125  RA = 16 RV = 63/18 PA = 66/29 (47) PCW = 11 Fick cardiac output/index = 5.6/2.3 Thermo CO/CI = 4.6/1.9 PVR = 6.5 (Fick) 7.9 (therm) FA sat = 96% PA sat = 62%, 62%  Assessment:  1. Hemodynamics most c/w with PAH and RV failure. Left sided filling  pressures are ok.   Discharge Medications   Allergies as of 03/03/2019   No Active Allergies     Medication List    STOP taking these medications   carvedilol 3.125 MG tablet Commonly known as: COREG   Insulin Pen Needle 31G X 8 MM Misc Commonly known as: B-D ULTRAFINE III SHORT PEN   isosorbide-hydrALAZINE 20-37.5 MG tablet Commonly known as: BIDIL   Lantus SoloStar 100 UNIT/ML Solostar Pen Generic drug: Insulin Glargine   nitroGLYCERIN 0.4 MG SL tablet Commonly known as: NITROSTAT   Pen Needles 31G X 5 MM Misc   torsemide 20 MG tablet Commonly known as: DEMADEX   Turmeric 500 MG Caps     TAKE these medications   amiodarone 200 MG tablet Commonly known as: PACERONE Take 1 tablet (200 mg total) by mouth daily. Start taking on: March 04, 2019   Lipitor 80 MG tablet Generic drug: atorvastatin Take 80 mg by mouth daily at 6 PM.   midodrine 10 MG tablet Commonly known as: PROAMATINE Take 1 tablet (10 mg total) by mouth 3 (three) times daily with meals.   OXYGEN Inhale into the lungs. 2L continuous; 3L on pulse machine   warfarin 5 MG tablet Commonly known as: COUMADIN Take as directed. If you are unsure how to take this medication, talk to your nurse or doctor. Original instructions: Take 1 tablet (5 mg total) by mouth one time only at 6 PM. What changed:   how much to take  when to take this  additional instructions            Durable Medical Equipment  (From admission, onward)         Start     Ordered   03/03/19 1138  For home use only DME Walker rolling  Once    Question:  Patient needs a walker to treat with the following condition  Answer:  Physical deconditioning   03/03/19 1138   03/03/19 0000  For home use only DME Walker rolling    Comments: 2 wheeled  Question:  Patient needs a walker to treat with the following condition  Answer:  Chronic systolic heart failure ([place])   03/03/19 1134   02/19/19 1926  Heart failure home health  orders  (Heart failure home health orders / Face to face)  Once    Comments: Heart Failure Follow-up Care:  Verify follow-up appointments per Patient Discharge Instructions. Confirm transportation arranged. Reconcile home medications with discharge medication list. Remove discontinued medications from use. Assist patient/caregiver to manage medications using pill box. Reinforce low sodium food selection Assessments: Vital signs and oxygen saturation at each visit. Assess home environment for safety concerns, caregiver support and availability of low-sodium foods. Consult Education officer, museum, PT/OT, Dietitian, and CNA based on assessments. Perform comprehensive cardiopulmonary assessment. Notify MD for any change in condition or weight gain of 3 pounds in one day  or 5 pounds in one week with symptoms. Daily Weights and Symptom Monitoring: Ensure patient has access to scales. Teach patient/caregiver to weigh daily before breakfast and after voiding using same scale and record.    Teach patient/caregiver to track weight and symptoms and when to notify Provider. Activity: Develop individualized activity plan with patient/caregiver.   HHRN 2 wk 2 for CP assessment   PT 2 wk 2 for CP Rehab.  Question Answer Comment  Heart Failure Follow-up Care [facility] ([facility]) [facility] at [phone]   Lab frequency Other see comments   Fax lab results to AHF Clinic at [phone]   Diet Low Sodium Heart Healthy   Fluid restrictions: 2000 mL Fluid      02/19/19 1926          Disposition   The patient will be discharged in stable condition to home. Discharge Instructions    Diet - low sodium heart healthy   Complete by: As directed    For home use only DME Walker rolling   Complete by: As directed    2 wheeled   Patient needs a walker to treat with the following condition: Chronic systolic heart failure ([place])   Increase activity slowly   Complete by: As directed      Follow-up  Information    [facility] Follow up on 03-23-2019.   Specialty: Cardiology Why: at 10:30 [id number] Contact information: [address] [id number] [place]       Waynetta Sandy, MD In 6 weeks.   Specialties: Vascular Surgery, Cardiology Why: Office will call you to arrange your appt (sent) Contact information: [address] [place] [address] [phone]             Duration of Discharge Encounter: Greater than 35 minutes   Signed, Lyda Jester  03/03/2019, 3:39 PM   Agree with above. Much improved with 50-pound volume removal with initiation of HD. Will need to follow closely to assess if he is candidate for heart kidney transplant eval. With volume removal, BMI now qualifies at 82.   Glori Bickers, MD  8:32 AM

## 2019-03-04 ENCOUNTER — Telehealth: Payer: Self-pay | Admitting: Physician Assistant

## 2019-03-04 NOTE — Telephone Encounter (Signed)
Transition of care contact from inpatient facility  Date of discharge: 03/03/2019 Date of contact: 03/04/2019 Method of contact: Phone  Attempted to contact patient to discuss transition of care from inpatient admission. I was informed by RN at outpatient dialysis unit that patient did not come to treatment today and said he will be there on Tuesday. No answer when I called the patient. Left message with call back number.

## 2019-03-04 NOTE — Telephone Encounter (Signed)
Patient returned call regarding transition of care from inpatient facility. Reports he did not go to dialysis today because he just got out of the hospital and had a lot going on. Reminded that attending dialysis will help keep him from being readmitted. Advised to call outpatient HD unit or go to ED if any shortness of breath occurs. Meds reviewed. All questions answered.

## 2019-03-06 ENCOUNTER — Telehealth: Payer: Self-pay | Admitting: *Deleted

## 2019-03-06 DIAGNOSIS — I472 Ventricular tachycardia, unspecified: Secondary | ICD-10-CM

## 2019-03-06 MED ORDER — AMIODARONE HCL 200 MG PO TABS: 200.0000 mg | ORAL_TABLET | Freq: Two times a day (BID) | ORAL | 3 refills | Status: AC

## 2019-03-06 NOTE — Telephone Encounter (Signed)
Increase ami to 200 bid. Check k and mag please

## 2019-03-06 NOTE — Telephone Encounter (Signed)
Spoke with patient. He is aware of recommendation to increase amiodarone to 200mg  BID. Pt requests to have labs drawn at HD tomorrow, will dialyze at [facility] on [place] I will clarify with dialysis center if this is possible.  Spoke with charge RN at dialysis center. Labs will result via [facility]. She advised to fax lab orders to [phone] and she will ask MD during HD session tomorrow if they have approval to draw these labs. Orders entered, faxed, confirmation received.  LMOVM requesting call back from pt--will provide update.

## 2019-03-06 NOTE — Telephone Encounter (Signed)
Merlin alert received for treated VT episodes. VT-1 episode on 02/16/19 at 11:37 with late break after ATP x1, occurred while hospitalized for [facility]HC, milrinone d/c at that time. VT-2 episode on 03/04/19 at 16:31, ATP x1 accelerated rhythm to VF zone, 30J shock x1 successful, converted to VP rhythm.   Patient reports that at the time of episode on 10/31, he was on his bed, lost consciousness, wife had to shake him awake. Unaware of shock, denies chest discomfort or dizziness but reports he was fatigued for the rest of the day. Feels back at baseline, no SOB, resumes HD tomorrow. He no longer drives. Compliant with cardiac medications, including amiodarone 200mg  daily. Pt has upcoming f/u in HF clinic on 03/17/2019. Advised I will forward to Dr. Lovena Le and Dr. Haroldine Laws for review and recommendations. Pt in agreement with plan, no further questions at this time.

## 2019-03-07 DIAGNOSIS — N186 End stage renal disease: Secondary | ICD-10-CM | POA: Diagnosis not present

## 2019-03-07 DIAGNOSIS — Z992 Dependence on renal dialysis: Secondary | ICD-10-CM | POA: Diagnosis not present

## 2019-03-07 DIAGNOSIS — D509 Iron deficiency anemia, unspecified: Secondary | ICD-10-CM | POA: Diagnosis not present

## 2019-03-07 DIAGNOSIS — E162 Hypoglycemia, unspecified: Secondary | ICD-10-CM | POA: Diagnosis not present

## 2019-03-07 DIAGNOSIS — T8249XA Other complication of vascular dialysis catheter, initial encounter: Secondary | ICD-10-CM | POA: Diagnosis not present

## 2019-03-07 DIAGNOSIS — D688 Other specified coagulation defects: Secondary | ICD-10-CM | POA: Diagnosis not present

## 2019-03-07 NOTE — Telephone Encounter (Signed)
Spoke with charge RN at dialysis center. Labs were drawn today during dialysis and should result by Friday.

## 2019-03-09 DIAGNOSIS — D688 Other specified coagulation defects: Secondary | ICD-10-CM | POA: Diagnosis not present

## 2019-03-09 DIAGNOSIS — N186 End stage renal disease: Secondary | ICD-10-CM | POA: Diagnosis not present

## 2019-03-09 DIAGNOSIS — E162 Hypoglycemia, unspecified: Secondary | ICD-10-CM | POA: Diagnosis not present

## 2019-03-09 DIAGNOSIS — T8249XA Other complication of vascular dialysis catheter, initial encounter: Secondary | ICD-10-CM | POA: Diagnosis not present

## 2019-03-09 DIAGNOSIS — D509 Iron deficiency anemia, unspecified: Secondary | ICD-10-CM | POA: Diagnosis not present

## 2019-03-09 DIAGNOSIS — Z992 Dependence on renal dialysis: Secondary | ICD-10-CM | POA: Diagnosis not present

## 2019-03-11 DIAGNOSIS — D509 Iron deficiency anemia, unspecified: Secondary | ICD-10-CM | POA: Diagnosis not present

## 2019-03-11 DIAGNOSIS — Z992 Dependence on renal dialysis: Secondary | ICD-10-CM | POA: Diagnosis not present

## 2019-03-11 DIAGNOSIS — E162 Hypoglycemia, unspecified: Secondary | ICD-10-CM | POA: Diagnosis not present

## 2019-03-11 DIAGNOSIS — T8249XA Other complication of vascular dialysis catheter, initial encounter: Secondary | ICD-10-CM | POA: Diagnosis not present

## 2019-03-11 DIAGNOSIS — N186 End stage renal disease: Secondary | ICD-10-CM | POA: Diagnosis not present

## 2019-03-11 DIAGNOSIS — D688 Other specified coagulation defects: Secondary | ICD-10-CM | POA: Diagnosis not present

## 2019-03-12 ENCOUNTER — Emergency Department (HOSPITAL_COMMUNITY): Payer: Medicare Other

## 2019-03-12 ENCOUNTER — Encounter (HOSPITAL_COMMUNITY): Payer: Self-pay

## 2019-03-12 ENCOUNTER — Inpatient Hospital Stay (HOSPITAL_COMMUNITY)
Admission: EM | Admit: 2019-03-12 | Discharge: 2019-04-04 | DRG: 291 | Disposition: E | Payer: Medicare Other | Attending: Pulmonary Disease | Admitting: Pulmonary Disease

## 2019-03-12 ENCOUNTER — Other Ambulatory Visit: Payer: Self-pay

## 2019-03-12 DIAGNOSIS — M109 Gout, unspecified: Secondary | ICD-10-CM | POA: Diagnosis not present

## 2019-03-12 DIAGNOSIS — I2510 Atherosclerotic heart disease of native coronary artery without angina pectoris: Secondary | ICD-10-CM | POA: Diagnosis present

## 2019-03-12 DIAGNOSIS — Z8673 Personal history of transient ischemic attack (TIA), and cerebral infarction without residual deficits: Secondary | ICD-10-CM | POA: Diagnosis not present

## 2019-03-12 DIAGNOSIS — I428 Other cardiomyopathies: Secondary | ICD-10-CM | POA: Diagnosis not present

## 2019-03-12 DIAGNOSIS — I272 Pulmonary hypertension, unspecified: Secondary | ICD-10-CM | POA: Diagnosis not present

## 2019-03-12 DIAGNOSIS — N186 End stage renal disease: Secondary | ICD-10-CM | POA: Diagnosis present

## 2019-03-12 DIAGNOSIS — I200 Unstable angina: Secondary | ICD-10-CM | POA: Diagnosis present

## 2019-03-12 DIAGNOSIS — D72829 Elevated white blood cell count, unspecified: Secondary | ICD-10-CM | POA: Diagnosis present

## 2019-03-12 DIAGNOSIS — R531 Weakness: Secondary | ICD-10-CM | POA: Diagnosis not present

## 2019-03-12 DIAGNOSIS — Z79899 Other long term (current) drug therapy: Secondary | ICD-10-CM

## 2019-03-12 DIAGNOSIS — I5082 Biventricular heart failure: Secondary | ICD-10-CM | POA: Diagnosis not present

## 2019-03-12 DIAGNOSIS — J9601 Acute respiratory failure with hypoxia: Secondary | ICD-10-CM | POA: Diagnosis not present

## 2019-03-12 DIAGNOSIS — N179 Acute kidney failure, unspecified: Secondary | ICD-10-CM | POA: Diagnosis present

## 2019-03-12 DIAGNOSIS — I4891 Unspecified atrial fibrillation: Secondary | ICD-10-CM | POA: Diagnosis present

## 2019-03-12 DIAGNOSIS — Z6831 Body mass index (BMI) 31.0-31.9, adult: Secondary | ICD-10-CM

## 2019-03-12 DIAGNOSIS — E1122 Type 2 diabetes mellitus with diabetic chronic kidney disease: Secondary | ICD-10-CM | POA: Diagnosis present

## 2019-03-12 DIAGNOSIS — I5042 Chronic combined systolic (congestive) and diastolic (congestive) heart failure: Secondary | ICD-10-CM | POA: Diagnosis not present

## 2019-03-12 DIAGNOSIS — I214 Non-ST elevation (NSTEMI) myocardial infarction: Secondary | ICD-10-CM | POA: Diagnosis not present

## 2019-03-12 DIAGNOSIS — E872 Acidosis, unspecified: Secondary | ICD-10-CM | POA: Diagnosis present

## 2019-03-12 DIAGNOSIS — E785 Hyperlipidemia, unspecified: Secondary | ICD-10-CM | POA: Diagnosis not present

## 2019-03-12 DIAGNOSIS — R570 Cardiogenic shock: Principal | ICD-10-CM | POA: Diagnosis present

## 2019-03-12 DIAGNOSIS — R0789 Other chest pain: Secondary | ICD-10-CM | POA: Diagnosis not present

## 2019-03-12 DIAGNOSIS — Z95810 Presence of automatic (implantable) cardiac defibrillator: Secondary | ICD-10-CM | POA: Diagnosis not present

## 2019-03-12 DIAGNOSIS — I5043 Acute on chronic combined systolic (congestive) and diastolic (congestive) heart failure: Secondary | ICD-10-CM | POA: Diagnosis present

## 2019-03-12 DIAGNOSIS — I959 Hypotension, unspecified: Secondary | ICD-10-CM | POA: Diagnosis not present

## 2019-03-12 DIAGNOSIS — Z87891 Personal history of nicotine dependence: Secondary | ICD-10-CM

## 2019-03-12 DIAGNOSIS — Z515 Encounter for palliative care: Secondary | ICD-10-CM | POA: Diagnosis not present

## 2019-03-12 DIAGNOSIS — R791 Abnormal coagulation profile: Secondary | ICD-10-CM | POA: Diagnosis not present

## 2019-03-12 DIAGNOSIS — I25110 Atherosclerotic heart disease of native coronary artery with unstable angina pectoris: Secondary | ICD-10-CM | POA: Diagnosis not present

## 2019-03-12 DIAGNOSIS — E875 Hyperkalemia: Secondary | ICD-10-CM | POA: Diagnosis not present

## 2019-03-12 DIAGNOSIS — E871 Hypo-osmolality and hyponatremia: Secondary | ICD-10-CM | POA: Diagnosis present

## 2019-03-12 DIAGNOSIS — R079 Chest pain, unspecified: Secondary | ICD-10-CM | POA: Diagnosis not present

## 2019-03-12 DIAGNOSIS — D689 Coagulation defect, unspecified: Secondary | ICD-10-CM | POA: Diagnosis present

## 2019-03-12 DIAGNOSIS — Z20828 Contact with and (suspected) exposure to other viral communicable diseases: Secondary | ICD-10-CM | POA: Diagnosis present

## 2019-03-12 DIAGNOSIS — G4733 Obstructive sleep apnea (adult) (pediatric): Secondary | ICD-10-CM | POA: Diagnosis not present

## 2019-03-12 DIAGNOSIS — N189 Chronic kidney disease, unspecified: Secondary | ICD-10-CM | POA: Diagnosis not present

## 2019-03-12 DIAGNOSIS — Z7901 Long term (current) use of anticoagulants: Secondary | ICD-10-CM | POA: Diagnosis not present

## 2019-03-12 DIAGNOSIS — Z992 Dependence on renal dialysis: Secondary | ICD-10-CM

## 2019-03-12 DIAGNOSIS — I132 Hypertensive heart and chronic kidney disease with heart failure and with stage 5 chronic kidney disease, or end stage renal disease: Secondary | ICD-10-CM | POA: Diagnosis present

## 2019-03-12 DIAGNOSIS — R42 Dizziness and giddiness: Secondary | ICD-10-CM | POA: Diagnosis not present

## 2019-03-12 DIAGNOSIS — I499 Cardiac arrhythmia, unspecified: Secondary | ICD-10-CM | POA: Diagnosis not present

## 2019-03-12 DIAGNOSIS — I50814 Right heart failure due to left heart failure: Secondary | ICD-10-CM | POA: Diagnosis not present

## 2019-03-12 DIAGNOSIS — R579 Shock, unspecified: Secondary | ICD-10-CM | POA: Diagnosis not present

## 2019-03-12 DIAGNOSIS — Z743 Need for continuous supervision: Secondary | ICD-10-CM | POA: Diagnosis not present

## 2019-03-12 DIAGNOSIS — K7200 Acute and subacute hepatic failure without coma: Secondary | ICD-10-CM | POA: Diagnosis present

## 2019-03-12 DIAGNOSIS — Z66 Do not resuscitate: Secondary | ICD-10-CM | POA: Diagnosis not present

## 2019-03-12 DIAGNOSIS — E119 Type 2 diabetes mellitus without complications: Secondary | ICD-10-CM

## 2019-03-12 DIAGNOSIS — I119 Hypertensive heart disease without heart failure: Secondary | ICD-10-CM | POA: Diagnosis present

## 2019-03-12 DIAGNOSIS — Z8249 Family history of ischemic heart disease and other diseases of the circulatory system: Secondary | ICD-10-CM

## 2019-03-12 LAB — BASIC METABOLIC PANEL WITH GFR
Anion gap: 18 — ABNORMAL HIGH (ref 5–15)
BUN: 33 mg/dL — ABNORMAL HIGH (ref 8–23)
CO2: 22 mmol/L (ref 22–32)
Calcium: 7.7 mg/dL — ABNORMAL LOW (ref 8.9–10.3)
Chloride: 90 mmol/L — ABNORMAL LOW (ref 98–111)
Creatinine, Ser: 6.25 mg/dL — ABNORMAL HIGH (ref 0.61–1.24)
GFR calc Af Amer: 10 mL/min — ABNORMAL LOW
GFR calc non Af Amer: 9 mL/min — ABNORMAL LOW
Glucose, Bld: 140 mg/dL — ABNORMAL HIGH (ref 70–99)
Potassium: 4.8 mmol/L (ref 3.5–5.1)
Sodium: 130 mmol/L — ABNORMAL LOW (ref 135–145)

## 2019-03-12 LAB — CBC WITH DIFFERENTIAL/PLATELET
Abs Immature Granulocytes: 0.19 K/uL — ABNORMAL HIGH (ref 0.00–0.07)
Basophils Absolute: 0 K/uL (ref 0.0–0.1)
Basophils Relative: 0 %
Eosinophils Absolute: 0 K/uL (ref 0.0–0.5)
Eosinophils Relative: 0 %
HCT: 38.5 % — ABNORMAL LOW (ref 39.0–52.0)
Hemoglobin: 12.6 g/dL — ABNORMAL LOW (ref 13.0–17.0)
Immature Granulocytes: 1 %
Lymphocytes Relative: 4 %
Lymphs Abs: 0.5 K/uL — ABNORMAL LOW (ref 0.7–4.0)
MCH: 31.5 pg (ref 26.0–34.0)
MCHC: 32.7 g/dL (ref 30.0–36.0)
MCV: 96.3 fL (ref 80.0–100.0)
Monocytes Absolute: 1.1 K/uL — ABNORMAL HIGH (ref 0.1–1.0)
Monocytes Relative: 8 %
Neutro Abs: 12.7 K/uL — ABNORMAL HIGH (ref 1.7–7.7)
Neutrophils Relative %: 87 %
Platelets: 108 K/uL — ABNORMAL LOW (ref 150–400)
RBC: 4 MIL/uL — ABNORMAL LOW (ref 4.22–5.81)
RDW: 16.9 % — ABNORMAL HIGH (ref 11.5–15.5)
WBC: 14.6 K/uL — ABNORMAL HIGH (ref 4.0–10.5)
nRBC: 2.1 % — ABNORMAL HIGH (ref 0.0–0.2)

## 2019-03-12 LAB — LACTIC ACID, PLASMA
Lactic Acid, Venous: 2.8 mmol/L (ref 0.5–1.9)
Lactic Acid, Venous: 1.9 mmol/L (ref 0.5–1.9)

## 2019-03-12 LAB — PROTIME-INR
INR: >10 (ref 0.8–1.2)
INR: >10 (ref 0.8–1.2)
Prothrombin Time: >90 seconds — ABNORMAL HIGH (ref 11.4–15.2)
Prothrombin Time: >90 s — ABNORMAL HIGH (ref 11.4–15.2)

## 2019-03-12 LAB — BRAIN NATRIURETIC PEPTIDE: B Natriuretic Peptide: 2298.7 pg/mL — ABNORMAL HIGH (ref 0.0–100.0)

## 2019-03-12 LAB — HEPATIC FUNCTION PANEL
ALT: 315 U/L — ABNORMAL HIGH (ref 0–44)
AST: 554 U/L — ABNORMAL HIGH (ref 15–41)
Albumin: 2.1 g/dL — ABNORMAL LOW (ref 3.5–5.0)
Alkaline Phosphatase: 233 U/L — ABNORMAL HIGH (ref 38–126)
Bilirubin, Direct: 2.7 mg/dL — ABNORMAL HIGH (ref 0.0–0.2)
Indirect Bilirubin: 1.8 mg/dL — ABNORMAL HIGH (ref 0.3–0.9)
Total Bilirubin: 4.5 mg/dL — ABNORMAL HIGH (ref 0.3–1.2)
Total Protein: 6.3 g/dL — ABNORMAL LOW (ref 6.5–8.1)

## 2019-03-12 LAB — TROPONIN I (HIGH SENSITIVITY)
Troponin I (High Sensitivity): 1321 ng/L (ref <18)
Troponin I (High Sensitivity): 1160 ng/L (ref <18)

## 2019-03-12 LAB — D-DIMER, QUANTITATIVE: D-Dimer, Quant: 1.03 ug/mL-FEU — ABNORMAL HIGH (ref 0.00–0.50)

## 2019-03-12 MED ORDER — ASPIRIN 81 MG PO CHEW: 324.0000 mg | CHEWABLE_TABLET | Freq: Once | ORAL | Status: DC

## 2019-03-12 MED ORDER — SODIUM CHLORIDE 0.9 % IV SOLN: INTRAVENOUS | Status: DC | PRN

## 2019-03-12 MED ORDER — VANCOMYCIN HCL 10 G IV SOLR
2000.0000 mg | Freq: Once | INTRAVENOUS | Status: AC
  Administered 2019-03-12: 2000 mg via INTRAVENOUS
  Filled 2019-03-12: qty 2000

## 2019-03-12 MED ORDER — ONDANSETRON HCL 4 MG/2ML IJ SOLN: 4.0000 mg | Freq: Four times a day (QID) | INTRAMUSCULAR | Status: DC | PRN

## 2019-03-12 MED ORDER — VITAMIN K1 10 MG/ML IJ SOLN
2.5000 mg | Freq: Once | INTRAVENOUS | Status: AC
  Administered 2019-03-12 (×2): 2.5 mg via INTRAVENOUS
  Filled 2019-03-12: qty 0.25

## 2019-03-12 MED ORDER — SODIUM CHLORIDE 0.9 % IV BOLUS
250.0000 mL | Freq: Once | INTRAVENOUS | Status: AC
  Administered 2019-03-12: 20:00:00 250 mL via INTRAVENOUS

## 2019-03-12 MED ORDER — SODIUM CHLORIDE 0.9 % IV SOLN
2.0000 g | Freq: Once | INTRAVENOUS | Status: AC
  Administered 2019-03-12: 2 g via INTRAVENOUS
  Filled 2019-03-12: qty 2

## 2019-03-12 MED ORDER — MIDODRINE HCL 5 MG PO TABS
10.0000 mg | ORAL_TABLET | Freq: Once | ORAL | Status: AC
  Administered 2019-03-12: 10 mg via ORAL
  Filled 2019-03-12 (×2): qty 2

## 2019-03-12 MED ORDER — ACETAMINOPHEN 325 MG PO TABS: 650.0000 mg | ORAL_TABLET | ORAL | Status: DC | PRN

## 2019-03-12 MED ORDER — SODIUM CHLORIDE 0.9 % IV SOLN
250.0000 mL | INTRAVENOUS | Status: DC
  Administered 2019-03-12: 250 mL via INTRAVENOUS

## 2019-03-12 MED ORDER — SODIUM CHLORIDE 0.9 % IV BOLUS
250.0000 mL | Freq: Once | INTRAVENOUS | Status: AC
  Administered 2019-03-12: 250 mL via INTRAVENOUS

## 2019-03-12 MED ORDER — NOREPINEPHRINE 4 MG/250ML-% IV SOLN
2.0000 ug/min | INTRAVENOUS | Status: DC
  Administered 2019-03-12: 2 ug/min via INTRAVENOUS
  Filled 2019-03-12 (×2): qty 250

## 2019-03-12 MED ORDER — LIDOCAINE-EPINEPHRINE (PF) 2 %-1:200000 IJ SOLN
10.0000 mL | Freq: Once | INTRAMUSCULAR | Status: AC
  Administered 2019-03-12: 10 mL
  Filled 2019-03-12: qty 20

## 2019-03-12 MED ORDER — PANTOPRAZOLE SODIUM 40 MG IV SOLR
40.0000 mg | Freq: Every day | INTRAVENOUS | Status: DC
  Administered 2019-03-12 – 2019-03-13 (×2): 40 mg via INTRAVENOUS
  Filled 2019-03-12 (×2): qty 40

## 2019-03-12 NOTE — ED Notes (Signed)
CCM at bedside 

## 2019-03-12 NOTE — ED Notes (Signed)
EDP at bedside for central line placement.

## 2019-03-12 NOTE — ED Notes (Signed)
Dr Regenia Skeeter notified of elevated INR and troponin.

## 2019-03-12 NOTE — ED Notes (Signed)
SVE:XOGAC defib     Hd : tue , thur sat . Post hd didn't feel good, weak .  68 bp by ems   son was picking him up under chest  to sit him on bed. Pt started having  chest pain in left side with breathing    Bp 74/56

## 2019-03-12 NOTE — ED Notes (Signed)
Lab to add on hepatic function

## 2019-03-12 NOTE — ED Notes (Signed)
Pt restless and uncomfortable in every position; requesting to lie on his side, encouraged not to lie on side due to hypotension. Attempted to reposition.
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
## 2019-03-12 NOTE — ED Notes (Signed)
Hematoma noted to L posterior thigh

## 2019-03-12 NOTE — Consult Note (Addendum)
NAME:  [name], MRN:  [id number], DOB:  19-Jun-1955, LOS: 0 ADMISSION DATE:  03/28/2019, CONSULTATION DATE:  03/30/2019 REFERRING MD:  ED, CHIEF COMPLAINT:  Chest pain   Brief History   [name] is a 63 year old man with HFrEF, CAD AICD pacemaker, mild pulmonary htn by echo, esrd on HD, here with chest pain with inspiration - L side.   Developed hypotension.  Given small fluid boluses, started levophed.  ED placed central line.     History of present illness    The pain is left-sided, pleuritic, and reproducible with palpation. Other than chest pain his main complaint is severe weakness in his legs and arms, which has been present since his recent hospitalization.  He has been trying to walk with a walker but is severely weak.  He denies dyspnea and orthopnea.  He uses oxygen and says he does not get out of breath.  He had dialysis yesterday and has been told his blood pressure has been low, but does not know what it has been.  Takes warfarin at home.   Recently here until 10/15 for HFrEF.  Was followed by heart failure team.  Recently started hemodialysis.   IN ICU, remained hypotensive.   Developed slight increased WOB and tachypnea.  Unable to get reliable sat (cold digits). Started bipap, reduced resp rate.  Patient talking, sleeping but arousable.    ABG fairly normal, PAO2 >350 Wide complex rhythm, stable.   No pacer spikes.     Past Medical History  CKD Recently started HD Gout HLD HTN DM 2 OSA Mild non obstructive CAD on [facility] 12/2018  CM out of proportion to CAD.  VT episodes 11/02 - increased amiodarone dosing.  Pulm HTN mild by cath 9/14   CAD (coronary artery disease)     a. Initial nonobst by cath 2009. b. Cath 01/2013 in setting of VT storm: obstructive distal Cx disease (small and terminates in the AV groove, unlikely to cause significant ischemia or electrical instability), nonobstructive RCA disease, EF 15-20%.   . Cerebrovascular accident [facility])    a.  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 Chronic systolic CHF (congestive heart failure) ([place])    a. Likely NICM (out of proportion to CAD). b. 2009 - EF 25-30% by echo, 01/2013: 15-20% by cath. c. 11/2014 Echo: EF 35-40%, Gr1 DD, mild MR, mod TR, PASP 20mmHg.  . CKD (chronic kidney disease), stage II   . Dyslipidemia   . Gout   . HTN (hypertension)   . Hypokalemia   . ICD (implantable cardiac defibrillator) in place   . Insulin dependent diabetes mellitus   . Lipoma   . Nonischemic cardiomyopathy ([place])    a.  11/2014 Echo: EF 35-40%, Gr1 DD, mild MR, mod TR, PASP 37mmHg.  . OSA (obstructive sleep apnea)    does not wear cpap  . PAF (paroxysmal atrial fibrillation) ([place])    a. Noted 05/2008 by EKG;  b. CHA2DS2VASc = 5-6-->coumadin.  . Paroxysmal VT ([place])    a. s/p St. Jude ICD 2007. b. H/o paroxysmal VT/VF including VT storm 12/2012 admission prompting amio initiation;  c. 01/2013 ICD upgrade SJM [id number] Ellipse VR single lead ICD.  [name] Pulmonary HTN ([place])    a. Mild by cath 01/2013.    Significant Hospital Events   Elevated INR  Elevated trop   Consults:  Cardiology consulted.    Procedures:  Arterial line   Significant Diagnostic Tests:  Covid: neg  Micro Data:  Blood cultures 11/8 pending.   Antimicrobials:   Cefepime  Vancomycin  Interim history/subjective:  Still has chest pain, tender to palpation  Objective   Blood pressure (!) 74/52, pulse 90, temperature 98 F (36.7 C), temperature source Oral, resp. rate (!) 23, height 5\' 10"  (1.778 m), weight 100.7 kg, SpO2 94 %.    FiO2 (%):  [35 %] 35 %   Intake/Output Summary (Last 24 hours) at 03/31/2019 2049 Last data filed at 03/11/2019 2002 Gross per 24 hour  Intake 600 ml  Output -  Net 600 ml   Filed Weights   03/28/2019 1642  Weight: 100.7 kg    Examination: General:  Tachypnea in 20s, improved WOB.  Answering simple questions, pleasant HENT: NCAT Lungs: diminished b Cardiovascular:  tachycardia, no murmur Abdomen: Nt, ND, NBS Extremities: B edema 3 + pitting.  Neuro: awake, sleepy but arousable.   Resolved Hospital Problem list     Assessment & Plan:  Hypotension:  Unreliable cuff pressures, placing arterial line after Vitamin K given.   Possible sepsis, unk source.  Consider PNA.  On cefepime and vanc.   Possibly cardiogenic component.  Check procalcitonin.   Elevated Trop - cardiology consulted. Anticoagulate when INR appropriate.  Cont to follow.   Elevated INR >10.  Unclear cause.  Consider DIC.   FFP held due to volume overload.  Vitamin K being given now.   No evidence of bleeding.  Check fibrinogen.    HFrEF: As above. BP low.   Will check bedside echo.   Resp distress; likely 2/2 fluid overload.  Trial of Bipap.    V tach history: restart amio when able.    ESRD: no emergent need for HD tonight.  Renal consult in AM for HD.   Edema in LE  Best practice:  Diet: NPO for now Pain/Anxiety/Delirium protocol (if indicated): na VAP protocol (if indicated): DVT prophylaxis: elevated INR.  GI prophylaxis: PPI Glucose control: Mobility: bed rest Code Status: Full  Family Communication:  Disposition: ICU   Labs   CBC: Recent Labs  Lab 03/18/2019 1645  WBC 14.6*  NEUTROABS 12.7*  HGB 12.6*  HCT 38.5*  MCV 96.3  PLT 108*    Basic Metabolic Panel: Recent Labs  Lab 03/14/2019 1645  NA 130*  K 4.8  CL 90*  CO2 22  GLUCOSE 140*  BUN 33*  CREATININE 6.25*  CALCIUM 7.7*   GFR: Estimated Creatinine Clearance: 14.6 mL/min (A) (by C-G formula based on SCr of 6.25 mg/dL (H)). Recent Labs  Lab 03/23/2019 1645 03/24/2019 1924  WBC 14.6*  --   LATICACIDVEN 2.8* 1.9    Liver Function Tests: No results for input(s): AST, ALT, ALKPHOS, BILITOT, PROT, ALBUMIN in the last 168 hours. No results for input(s): LIPASE, AMYLASE in the last 168 hours. No results for input(s): AMMONIA in the last 168 hours.  ABG    Component Value Date/Time    PHART 7.425 12/06/2018 1413   PCO2ART 40.5 12/06/2018 1413   PO2ART 82.0 (L) 12/06/2018 1413   HCO3 30.0 (H) 02/16/2019 1022   TCO2 31 02/16/2019 1022   ACIDBASEDEF 1.0 11/17/2014 0315   O2SAT 76.5 02/26/2019 0615     Coagulation Profile: Recent Labs  Lab 03/11/2019 1645 03/19/2019 1924  INR >10.0* >10.0*    Cardiac Enzymes: No results for input(s): CKTOTAL, CKMB, CKMBINDEX, TROPONINI in the last 168 hours.  HbA1C: Hemoglobin A1C  Date/Time Value Ref Range Status  01/25/2019 11:02 AM 5.6 4.0 - 5.6 % Final  Hgb A1c MFr Bld  Date/Time Value Ref Range Status  07/21/2018 11:14 AM 6.5 4.6 - 6.5 % Final    Comment:    Glycemic Control Guidelines for People with Diabetes:Non Diabetic:  <6%Goal of Therapy: <7%Additional Action Suggested:  >8%   02/08/2018 12:14 PM 5.5 4.6 - 6.5 % Final    Comment:    Glycemic Control Guidelines for People with Diabetes:Non Diabetic:  <6%Goal of Therapy: <7%Additional Action Suggested:  >8%     CBG: No results for input(s): GLUCAP in the last 168 hours.  Review of Systems:   Review of Systems  Constitutional: Negative for chills, diaphoresis, fever, malaise/fatigue and weight loss.  HENT: Negative for congestion.   Eyes: Negative for blurred vision.  Respiratory: Positive for shortness of breath. Negative for cough and sputum production.   Cardiovascular: Positive for chest pain, orthopnea and leg swelling. Negative for palpitations.  Gastrointestinal: Negative for abdominal pain and heartburn.  Genitourinary: Negative for flank pain.  Musculoskeletal: Negative for back pain, joint pain and neck pain.  Skin: Negative for rash.  Neurological: Negative for dizziness.     Past Medical History  He,  has a past medical history of CAD (coronary artery disease), Cerebrovascular accident ([place]), Chronic systolic CHF (congestive heart failure) ([place]), CKD (chronic kidney disease), stage II, Dyslipidemia, Gout, HTN (hypertension), Hypokalemia, ICD  (implantable cardiac defibrillator) in place, Insulin dependent diabetes mellitus, Lipoma, Nonischemic cardiomyopathy ([place]), OSA (obstructive sleep apnea), PAF (paroxysmal atrial fibrillation) ([place]), Paroxysmal VT ([place]), and Pulmonary HTN ([place]).   Surgical History    Past Surgical History:  Procedure Laterality Date  . AV FISTULA PLACEMENT Right 03/02/2019   Procedure: Arteriovenous (Av) Fistula Creation right upper arm;  Surgeon: Waynetta Sandy, MD;  Location: [place];  Service: Vascular;  Laterality: Right;  . CARDIAC CATHETERIZATION     Nonobstructive coronary disease 2009  . CARDIAC DEFIBRILLATOR PLACEMENT     ICD-St. Jude  . CARDIOVERSION N/A 01/03/2019   Procedure: CARDIOVERSION;  Surgeon: Jolaine Artist, MD;  Location[facility] ENDOSCOPY;  Service: Cardiovascular;  Laterality: N/A;  . CENTRAL LINE INSERTION  02/16/2019   Procedure: CENTRAL LINE INSERTION;  Surgeon: Jolaine Artist, MD;  Location: [place] CV LAB;  Service: Cardiovascular;;  . IMPLANTABLE CARDIOVERTER DEFIBRILLATOR (ICD) GENERATOR CHANGE N/A 01/15/2014   Procedure: ICD GENERATOR CHANGE;  Surgeon: Evans Lance, MD;  Location: [facility] CATH LAB;  Service: Cardiovascular;  Laterality: N/A;  . IR FLUORO GUIDE CV LINE RIGHT  02/24/2019  . IR US GUIDE VASC ACCESS RIGHT  02/24/2019  . LEFT AND RIGHT HEART CATHETERIZATION WITH CORONARY ANGIOGRAM N/A 01/03/2013   Procedure: LEFT AND RIGHT HEART CATHETERIZATION WITH CORONARY ANGIOGRAM;  Surgeon: Peter M Martinique, MD;  Location: [facility] CATH LAB;  Service: Cardiovascular;  Laterality: N/A;  . LIPOMA EXCISION    . RIGHT HEART CATH N/A 02/16/2019   Procedure: RIGHT HEART CATH;  Surgeon: Jolaine Artist, MD;  Location: [place] CV LAB;  Service: Cardiovascular;  Laterality: N/A;  . RIGHT/LEFT HEART CATH AND CORONARY ANGIOGRAPHY N/A 10/04/2017   Procedure: RIGHT/LEFT HEART CATH AND CORONARY ANGIOGRAPHY;  Surgeon: Larey Dresser, MD;  Location: [place] CV LAB;  Service:  Cardiovascular;  Laterality: N/A;  . RIGHT/LEFT HEART CATH AND CORONARY ANGIOGRAPHY N/A 12/06/2018   Procedure: RIGHT/LEFT HEART CATH AND CORONARY ANGIOGRAPHY;  Surgeon: Jolaine Artist, MD;  Location: [place] CV LAB;  Service: Cardiovascular;  Laterality: N/A;     Social History   reports that he quit  smoking about 32 years ago. He has never used smokeless tobacco. He reports that he does not drink alcohol or use drugs.   Family History   His family history includes Coronary artery disease in his mother; Heart attack in his maternal grandmother, maternal uncle, and mother.   Allergies No Known Allergies   Home Medications  Prior to Admission medications   Medication Sig Start Date End Date Taking? Authorizing Provider  acetaminophen (TYLENOL) 500 MG tablet Take 1,000 mg by mouth every 6 (six) hours as needed for headache (pain).   Yes [provider]  amiodarone (PACERONE) 200 MG tablet Take 1 tablet (200 mg total) by mouth 2 (two) times daily. 03/06/19  Yes Bensimhon, Shaune Pascal, MD  atorvastatin (LIPITOR) 80 MG tablet Take 80 mg by mouth at bedtime.    Yes [provider]  chlorproMAZINE (THORAZINE) 25 MG tablet Take 25 mg by mouth 3 (three) times daily as needed for hiccoughs.  03/10/19  Yes [provider]  midodrine (PROAMATINE) 10 MG tablet Take 1 tablet (10 mg total) by mouth 3 (three) times daily with meals. 03/03/19  Yes Lyda Jester M, PA-C  multivitamin (RENA-VIT) TABS tablet Take 1 tablet by mouth daily.   Yes [provider]  OXYGEN Inhale 2-3 L into the lungs continuous. 2L continuous; 3L on pulse machine    Yes [provider]  warfarin (COUMADIN) 5 MG tablet Take 1 tablet (5 mg total) by mouth one time only at 6 PM. Patient taking differently: Take 5 mg by mouth at bedtime.  03/03/19  Yes Consuelo Pandy, PA-C     Critical care time:  75 min    In early am, pt became more consistently hypotensive, cool  extremities, more edema in periphery, worsening resp failure.  Bedside echo, severely reduced ef.   Wide complex rhythm.   Discussed with cardiology on call.   Increased levophed, added dobutamine.   Unable to place central line in neck for CVP MVO2 due to high INR.  FFP pending.  Given Vit K 10mg .  Radial art line placed.   Repeat labs pending.

## 2019-03-12 NOTE — ED Provider Notes (Signed)
[facility] EMERGENCY DEPARTMENT Provider Note   CSN: [id number] Arrival date & time: 03/18/2019  1622     History   Chief Complaint Chief Complaint  Patient presents with   Chest Pain    left side post transfer to bed    HPI CASTULO SCARPELLI is a 63 y.o. male.     HPI  63 year old male presents with acute chest pain.  Started shortly before EMS arrival.  The patient all of a sudden felt chest pain when his son was helping him up in the bed.  He previously was feeling generally weak, which she has been feeling since he left the hospital in October.  Bilateral leg weakness is chronic.  No cough, shortness of breath, vomiting.  The chest pain feels like a pressure and is a 4 out of 10 in his lower chest.  Worsens with inspiration/hiccuping.  States he has had it before but is not sure from what.  EMS noted his blood pressure to be quite low, in the 70s and 60s and came up with 250 cc IV fluid.  Patient does not know what his normal blood pressure is.  Last had dialysis yesterday. Given ASA by EMS.  Past Medical History:  Diagnosis Date   CAD (coronary artery disease)    a. Initial nonobst by cath 2009. b. Cath 01/2013 in setting of VT storm: obstructive distal Cx disease (small and terminates in the AV groove, unlikely to cause significant ischemia or electrical instability), nonobstructive RCA disease, EF 15-20%.    Cerebrovascular accident [facility])    a. Basilar CVA 2000. denies deficits   Chronic systolic CHF (congestive heart failure) ([place])    a. Likely NICM (out of proportion to CAD). b. 2009 - EF 25-30% by echo, 01/2013: 15-20% by cath. c. 11/2014 Echo: EF 35-40%, Gr1 DD, mild MR, mod TR, PASP 38mmHg.   CKD (chronic kidney disease), stage II    Dyslipidemia    Gout    HTN (hypertension)    Hypokalemia    ICD (implantable cardiac defibrillator) in place    Insulin dependent diabetes mellitus    Lipoma    Nonischemic cardiomyopathy ([place])    a.  11/2014  Echo: EF 35-40%, Gr1 DD, mild MR, mod TR, PASP 71mmHg.   OSA (obstructive sleep apnea)    does not wear cpap   PAF (paroxysmal atrial fibrillation) ([place])    a. Noted 05/2008 by EKG;  b. CHA2DS2VASc = 5-6-->coumadin.   Paroxysmal VT ([place])    a. s/p St. Jude ICD 2007. b. H/o paroxysmal VT/VF including VT storm 12/2012 admission prompting amio initiation;  c. 01/2013 ICD upgrade SJM [id number] Ellipse VR single lead ICD.   Pulmonary HTN ([place])    a. Mild by cath 01/2013.    Patient Active Problem List   Diagnosis Date Noted   Unstable angina ([place]) 03/27/2019   Arterial hypotension 03/07/2019   ESRD on dialysis [facility])    Palliative care by specialist    Acute on chronic combined systolic and diastolic CHF (congestive heart failure) ([place]) 56/43/3295   Systolic congestive heart failure ([place]) 01/02/2019   Acute kidney injury superimposed on chronic kidney disease ([place]) 01/02/2019   Chronic respiratory failure ([place]) 07/21/2018   Degenerative arthritis of knee, bilateral May 14, 202019   Essential hypertension 10/01/2017   DNR (do not resuscitate) discussion 08/07/2017   Obesity (BMI 35.0-39.9 without comorbidity) 08/03/2017   Dyslipidemia 08/03/2017   CKD (chronic kidney disease), stage III 08/03/2017   Ischemic chest  pain ([place]) 07/27/2017   Hypoxia 09/22/2016   Tear of MCL (medial collateral ligament) of knee, left, initial encounter 08/21/2016   Degenerative arthritis of left knee 08/21/2016   Morbid obesity ([place]) 12/05/2014   Type 2 diabetes mellitus ([place]) 11/21/2014   OSA (obstructive sleep apnea) 11/21/2014   Encounter for therapeutic drug monitoring 06/23/2013   PAF (paroxysmal atrial fibrillation) ([place]) 12/30/2012   Nonischemic cardiomyopathy ([place])    Ventricular tachycardia ([place]) 11/16/2012   Hearing loss, left 03/18/2012   Dizziness 12/22/2011   Obesity 01/16/2011   Long term (current) use of anticoagulants 08/05/2010   Automatic implantable  cardioverter-defibrillator in situ 08/15/2009   FATIGUE 06/17/2009   Coronary atherosclerosis 10/11/2008   Paroxysmal ventricular tachycardia ([place]) 08/28/2008   Atrial fibrillation ([place]) 08/28/2008   History of cardiovascular disorder 12/06/2007   Hypertensive heart disease 05/09/2007   LIPOMA 05/06/2007   Hyperlipidemia 05/06/2007   Gout 05/06/2007   Chronic combined systolic and diastolic CHF (congestive heart failure) ([place]) 05/06/2007   ACUTE BUT ILL-DEFINED CEREBROVASCULAR DISEASE 05/06/2007   DENTAL CARIES 05/06/2007    Past Surgical History:  Procedure Laterality Date   AV FISTULA PLACEMENT Right 03/02/2019   Procedure: Arteriovenous (Av) Fistula Creation right upper arm;  Surgeon: Waynetta Sandy, MD;  Location: [facility];  Service: Vascular;  Laterality: Right;   CARDIAC CATHETERIZATION     Nonobstructive coronary disease 2009   CARDIAC DEFIBRILLATOR PLACEMENT     ICD-St. Jude   CARDIOVERSION N/A 01/03/2019   Procedure: CARDIOVERSION;  Surgeon: Jolaine Artist, MD;  Location: [facility] ENDOSCOPY;  Service: Cardiovascular;  Laterality: N/A;   CENTRAL LINE INSERTION  02/16/2019   Procedure: CENTRAL LINE INSERTION;  Surgeon: Jolaine Artist, MD;  Location: [facility] CV LAB;  Service: Cardiovascular;;   IMPLANTABLE CARDIOVERTER DEFIBRILLATOR (ICD) GENERATOR CHANGE N/A 01/15/2014   Procedure: ICD GENERATOR CHANGE;  Surgeon: Evans Lance, MD;  Location: [facility] CATH LAB;  Service: Cardiovascular;  Laterality: N/A;   IR FLUORO GUIDE CV LINE RIGHT  02/24/2019   IR US GUIDE VASC ACCESS RIGHT  02/24/2019   LEFT AND RIGHT HEART CATHETERIZATION WITH CORONARY ANGIOGRAM N/A 01/03/2013   Procedure: LEFT AND RIGHT HEART CATHETERIZATION WITH CORONARY ANGIOGRAM;  Surgeon: Peter M Martinique, MD;  Location: [facility] CATH LAB;  Service: Cardiovascular;  Laterality: N/A;   LIPOMA EXCISION     RIGHT HEART CATH N/A 02/16/2019   Procedure: RIGHT HEART CATH;  Surgeon: Jolaine Artist, MD;  Location: [facility] CV LAB;  Service: Cardiovascular;  Laterality: N/A;   RIGHT/LEFT HEART CATH AND CORONARY ANGIOGRAPHY N/A 10/04/2017   Procedure: RIGHT/LEFT HEART CATH AND CORONARY ANGIOGRAPHY;  Surgeon: Larey Dresser, MD;  Location: [facility] CV LAB;  Service: Cardiovascular;  Laterality: N/A;   RIGHT/LEFT HEART CATH AND CORONARY ANGIOGRAPHY N/A 12/06/2018   Procedure: RIGHT/LEFT HEART CATH AND CORONARY ANGIOGRAPHY;  Surgeon: Jolaine Artist, MD;  Location: [facility] CV LAB;  Service: Cardiovascular;  Laterality: N/A;        Home Medications    Prior to Admission medications   Medication Sig Start Date End Date Taking? Authorizing Provider  acetaminophen (TYLENOL) 500 MG tablet Take 1,000 mg by mouth every 6 (six) hours as needed for headache (pain).   Yes [provider]  amiodarone (PACERONE) 200 MG tablet Take 1 tablet (200 mg total) by mouth 2 (two) times daily. 03/06/19  Yes Bensimhon, Shaune Pascal, MD  atorvastatin (LIPITOR) 80 MG tablet Take 80 mg by mouth at bedtime.  Yes [provider]  chlorproMAZINE (THORAZINE) 25 MG tablet Take 25 mg by mouth 3 (three) times daily as needed for hiccoughs.  03/10/19  Yes [provider]  midodrine (PROAMATINE) 10 MG tablet Take 1 tablet (10 mg total) by mouth 3 (three) times daily with meals. 03/03/19  Yes Lyda Jester M, PA-C  multivitamin (RENA-VIT) TABS tablet Take 1 tablet by mouth daily.   Yes [provider]  OXYGEN Inhale 2-3 L into the lungs continuous. 2L continuous; 3L on pulse machine    Yes [provider]  warfarin (COUMADIN) 5 MG tablet Take 1 tablet (5 mg total) by mouth one time only at 6 PM. Patient taking differently: Take 5 mg by mouth at bedtime.  03/03/19  Yes Consuelo Pandy, PA-C    Family History Family History  Problem Relation Age of Onset   Coronary artery disease Mother    Heart attack Mother    Heart attack Maternal Uncle     Heart attack Maternal Grandmother     Social History Social History   Tobacco Use   Smoking status: Former Smoker    Quit date: 05/04/1986    Years since quitting: 32.8   Smokeless tobacco: Never Used   Tobacco comment: Quit smoking 30 yrs ago. Smoked as teenager less than 1/2 ppd. Smoked x 4 years.  Substance Use Topics   Alcohol use: No    Comment: occasionally   Drug use: No     Allergies   Patient has no known allergies.   Review of Systems Review of Systems  Constitutional: Negative for fever.  Respiratory: Negative for cough and shortness of breath.   Cardiovascular: Positive for chest pain. Negative for leg swelling.  Gastrointestinal: Negative for abdominal pain and vomiting.  Musculoskeletal: Negative for back pain.  Neurological: Positive for weakness.  All other systems reviewed and are negative.    Physical Exam Updated Vital Signs BP (!) 95/54    Pulse 96    Temp 98 F (36.7 C) (Oral)    Resp (!) 28    Ht 5\' 10"  (1.778 m)    Wt 100.7 kg    SpO2 96%    BMI 31.85 kg/m   Physical Exam Vitals signs and nursing note reviewed.  Constitutional:      General: He is not in acute distress.    Appearance: He is well-developed. He is not ill-appearing or diaphoretic.  HENT:     Head: Normocephalic and atraumatic.     Right Ear: External ear normal.     Left Ear: External ear normal.     Nose: Nose normal.  Eyes:     General:        Right eye: No discharge.        Left eye: No discharge.  Neck:     Musculoskeletal: Neck supple.  Cardiovascular:     Rate and Rhythm: Normal rate and regular rhythm.     Heart sounds: Normal heart sounds.  Pulmonary:     Effort: Pulmonary effort is normal.     Breath sounds: Normal breath sounds.  Chest:     Chest wall: Tenderness (anterior lower left chest) present.  Abdominal:     Palpations: Abdomen is soft.     Tenderness: There is no abdominal tenderness.  Musculoskeletal:     Right lower leg: Edema present.      Left lower leg: Edema present.  Skin:    General: Skin is warm and dry.  Neurological:  Mental Status: He is alert.  Psychiatric:        Mood and Affect: Mood is not anxious.      ED Treatments / Results  Labs (all labs ordered are listed, but only abnormal results are displayed) Labs Reviewed  BASIC METABOLIC PANEL - Abnormal; Notable for the following components:      Result Value   Sodium 130 (*)    Chloride 90 (*)    Glucose, Bld 140 (*)    BUN 33 (*)    Creatinine, Ser 6.25 (*)    Calcium 7.7 (*)    GFR calc non Af Amer 9 (*)    GFR calc Af Amer 10 (*)    Anion gap 18 (*)    All other components within normal limits  CBC WITH DIFFERENTIAL/PLATELET - Abnormal; Notable for the following components:   WBC 14.6 (*)    RBC 4.00 (*)    Hemoglobin 12.6 (*)    HCT 38.5 (*)    RDW 16.9 (*)    Platelets 108 (*)    nRBC 2.1 (*)    Neutro Abs 12.7 (*)    Lymphs Abs 0.5 (*)    Monocytes Absolute 1.1 (*)    Abs Immature Granulocytes 0.19 (*)    All other components within normal limits  D-DIMER, QUANTITATIVE (NOT AT [facility]) - Abnormal; Notable for the following components:   D-Dimer, Quant 1.03 (*)    All other components within normal limits  PROTIME-INR - Abnormal; Notable for the following components:   Prothrombin Time >90.0 (*)    INR >10.0 (*)    All other components within normal limits  BRAIN NATRIURETIC PEPTIDE - Abnormal; Notable for the following components:   B Natriuretic Peptide 2,298.7 (*)    All other components within normal limits  LACTIC ACID, PLASMA - Abnormal; Notable for the following components:   Lactic Acid, Venous 2.8 (*)    All other components within normal limits  PROTIME-INR - Abnormal; Notable for the following components:   Prothrombin Time >90.0 (*)    INR >10.0 (*)    All other components within normal limits  HEPATIC FUNCTION PANEL - Abnormal; Notable for the following components:   Total Protein 6.3 (*)    Albumin 2.1 (*)     AST 554 (*)    ALT 315 (*)    Alkaline Phosphatase 233 (*)    Total Bilirubin 4.5 (*)    Bilirubin, Direct 2.7 (*)    Indirect Bilirubin 1.8 (*)    All other components within normal limits  TROPONIN I (HIGH SENSITIVITY) - Abnormal; Notable for the following components:   Troponin I (High Sensitivity) 1,321 (*)    All other components within normal limits  TROPONIN I (HIGH SENSITIVITY) - Abnormal; Notable for the following components:   Troponin I (High Sensitivity) 1,160 (*)    All other components within normal limits  CULTURE, BLOOD (ROUTINE X 2)  CULTURE, BLOOD (ROUTINE X 2)  SARS CORONAVIRUS 2 (TAT 6-24 HRS)  LACTIC ACID, PLASMA  COMPREHENSIVE METABOLIC PANEL  PHOSPHORUS  MAGNESIUM  PROCALCITONIN  CBC  PROTIME-INR  STREP PNEUMONIAE URINARY ANTIGEN  LEGIONELLA PNEUMOPHILA SEROGP 1 UR AG  I-STAT ARTERIAL BLOOD GAS, ED    EKG EKG Interpretation  Date/Time:  Sunday March 12 2019 16:33:07 EST Ventricular Rate:  94 PR Interval:    QRS Duration: 154 QT Interval:  419 QTC Calculation: 524 R Axis:   -178 Text Interpretation: Sinus rhythm Prolonged PR interval Left  atrial enlargement Nonspecific intraventricular conduction delay Consider anterolateral infarct Confirmed by Sherwood Gambler [phone]) on 03/19/2019 4:44:01 PM   Radiology Dg Chest Portable 1 View  Result Date: 03/11/2019 CLINICAL DATA:  Left chest pain EXAM: PORTABLE CHEST 1 VIEW COMPARISON:  02/13/2019 FINDINGS: Interval placement of right neck large bore multi lumen vascular catheter, tip near the superior cavoatrial junction. Cardiomegaly. Left chest pacer defibrillator. Diffuse interstitial pulmonary opacity bilaterally, similar to prior examination. No focal airspace opacity. IMPRESSION: 1. Interval placement of right neck large bore multi lumen vascular catheter, tip near the superior cavoatrial junction. 2. Diffuse interstitial pulmonary opacity bilaterally, similar to prior examination and likely edema in  the setting of cardiomegaly. No focal airspace opacity. Electronically Signed   By: Eddie Candle M.D.   On: 04/03/2019 17:23    Procedures .Central Line  Date/Time: 03/22/2019 10:27 PM Performed by: Sherwood Gambler, MD Authorized by: Sherwood Gambler, MD   Consent:    Consent obtained:  Verbal and written   Consent given by:  Patient   Risks discussed:  Arterial puncture, bleeding, infection, incorrect placement and nerve damage   Alternatives discussed:  No treatment Pre-procedure details:    Hand hygiene: Hand hygiene performed prior to insertion     Sterile barrier technique: All elements of maximal sterile technique followed     Skin preparation:  ChloraPrep   Skin preparation agent: Skin preparation agent completely dried prior to procedure   Anesthesia (see MAR for exact dosages):    Anesthesia method:  Local infiltration   Local anesthetic:  Lidocaine 2% WITH epi Procedure details:    Location:  R femoral   Site selection rationale:  Elevated INR, unable to use neck b/c of dialysis catheter   Patient position:  Flat   Procedural supplies:  Triple lumen   Catheter size:  7.5 Fr   Landmarks identified: yes     Ultrasound guidance: yes     Sterile ultrasound techniques: Sterile gel and sterile probe covers were used     Number of attempts:  1   Successful placement: yes   Post-procedure details:    Post-procedure:  Dressing applied and line sutured   Assessment:  Blood return through all ports and free fluid flow   Patient tolerance of procedure:  Tolerated well, no immediate complications .Critical Care Performed by: Sherwood Gambler, MD Authorized by: Sherwood Gambler, MD   Critical care provider statement:    Critical care time (minutes):  60   Critical care time was exclusive of:  Separately billable procedures and treating other patients   Critical care was necessary to treat or prevent imminent or life-threatening deterioration of the following conditions:  Cardiac  failure, circulatory failure, respiratory failure and shock   Critical care was time spent personally by me on the following activities:  Discussions with consultants, evaluation of patient's response to treatment, examination of patient, ordering and performing treatments and interventions, ordering and review of laboratory studies, ordering and review of radiographic studies, pulse oximetry, re-evaluation of patient's condition, obtaining history from patient or surrogate, review of old charts, vascular access procedures and development of treatment plan with patient or surrogate   (including critical care time)  Medications Ordered in ED Medications  vancomycin (VANCOCIN) 2,000 mg in sodium chloride 0.9 % 500 mL IVPB (2,000 mg Intravenous New Bag/Given 03/19/2019 2234)  0.9 %  sodium chloride infusion (250 mLs Intravenous New Bag/Given 04/01/2019 2100)  norepinephrine (LEVOPHED) 4mg  in 286mL premix infusion (4 mcg/min Intravenous Rate/Dose Change 03/18/2019 2237)  acetaminophen (TYLENOL) tablet 650 mg (has no administration in time range)  ondansetron (ZOFRAN) injection 4 mg (has no administration in time range)  pantoprazole (PROTONIX) injection 40 mg (40 mg Intravenous Given 03/16/2019 2308)  sodium chloride 0.9 % bolus 250 mL (0 mLs Intravenous Stopped 03/23/2019 1720)  midodrine (PROAMATINE) tablet 10 mg (10 mg Oral Given 04/02/2019 1848)  ceFEPIme (MAXIPIME) 2 g in sodium chloride 0.9 % 100 mL IVPB (0 g Intravenous Stopped 03/28/2019 1841)  phytonadione (VITAMIN K) 2.5 mg in dextrose 5 % 50 mL IVPB (0 mg Intravenous Stopped 03/23/2019 2055)  sodium chloride 0.9 % bolus 250 mL (0 mLs Intravenous Stopped 03/08/2019 2002)  lidocaine-EPINEPHrine (XYLOCAINE W/EPI) 2 %-1:200000 (PF) injection 10 mL (10 mLs Infiltration Given by Other 03/12/19 2142)     Initial Impression / Assessment and Plan / ED Course  I have reviewed the triage vital signs and the nursing notes.  Pertinent labs & imaging results that were  available during my care of the patient were reviewed by me and considered in my medical decision making (see chart for details).        Patient presents with chest pain that appears to be reproducible and started with a sudden pop as he was moved.  Seems like chest wall etiology.  However his blood pressure is low and while it did respond to a small bolus of fluids, his cardiovascular status is quite tenuous.  He has pretty severe CHF as well as being on dialysis.  He appears more fluid overloaded rather than hypovolemic but his blood pressure is also quite low.  Did not take his afternoon midodrine so he was given this.  Small boluses of fluids did help but at some point it is not going to help and push him over the edge.  Cardiology was consulted as well as nephrology.  Patient does have multiple blood pressures in the 70s at dialysis per Dr. Jonnie Finner. Dr. Riley Lam consulted from cardiology and this is probably not a primary cardiac problem per them. Troponins are high but flat/decreasing, unlikely to be primary MI.  It is unclear why his INR is so high but it was rechecked and still undetectably high.  He was given vitamin K but perhaps this is DIC rather than too high of a warfarin dosing.  His blood pressures remain soft and his mental status and overall clinical picture seem to be slowly worsening.  At first it seemed like he was tolerating this but not anymore.  Originally Dr. Jonelle Sidle had plan to admit but because of dropping his blood pressures again, recommended ICU admission.  He was placed on Levophed and central line was placed as above.  ICU will admit.  He does not have any overt obvious cause of sepsis but he was given broad antibiotics.  He is awake and alert and normally converses with me and does not appear to have any impending airway compromise at this time.    LOVIE AGRESTA was evaluated in Emergency Department on 03/12/2019 for the symptoms described in the history of present  illness. He was evaluated in the context of the global COVID-19 pandemic, which necessitated consideration that the patient might be at risk for infection with the SARS-CoV-2 virus that causes COVID-19. Institutional protocols and algorithms that pertain to the evaluation of patients at risk for COVID-19 are in a state of rapid change based on information released by regulatory bodies including the CDC and federal and state organizations. These policies and algorithms were followed  during the patient's care in the ED.   Final Clinical Impressions(s) / ED Diagnoses   Final diagnoses:  Shock ([place])  Supratherapeutic INR    ED Discharge Orders    None       Sherwood Gambler, MD 03/11/2019 [phone]

## 2019-03-13 ENCOUNTER — Inpatient Hospital Stay (HOSPITAL_COMMUNITY): Payer: Medicare Other

## 2019-03-13 ENCOUNTER — Encounter (HOSPITAL_COMMUNITY): Payer: Medicare Other

## 2019-03-13 DIAGNOSIS — R570 Cardiogenic shock: Secondary | ICD-10-CM | POA: Diagnosis present

## 2019-03-13 DIAGNOSIS — R579 Shock, unspecified: Secondary | ICD-10-CM

## 2019-03-13 LAB — POCT I-STAT EG7
Acid-base deficit: 7 mmol/L — ABNORMAL HIGH (ref 0.0–2.0)
Acid-base deficit: 7 mmol/L — ABNORMAL HIGH (ref 0.0–2.0)
Bicarbonate: 19 mmol/L — ABNORMAL LOW (ref 20.0–28.0)
Bicarbonate: 18.7 mmol/L — ABNORMAL LOW (ref 20.0–28.0)
Calcium, Ion: 0.81 mmol/L — CL (ref 1.15–1.40)
Calcium, Ion: 0.8 mmol/L — CL (ref 1.15–1.40)
HCT: 42 % (ref 39.0–52.0)
HCT: 43 % (ref 39.0–52.0)
Hemoglobin: 14.3 g/dL (ref 13.0–17.0)
Hemoglobin: 14.6 g/dL (ref 13.0–17.0)
O2 Saturation: 39 %
O2 Saturation: 54 %
Patient temperature: 97.3
Patient temperature: 97.6
Potassium: 5 mmol/L (ref 3.5–5.1)
Potassium: 5.2 mmol/L — ABNORMAL HIGH (ref 3.5–5.1)
Sodium: 130 mmol/L — ABNORMAL LOW (ref 135–145)
Sodium: 130 mmol/L — ABNORMAL LOW (ref 135–145)
TCO2: 20 mmol/L — ABNORMAL LOW (ref 22–32)
TCO2: 20 mmol/L — ABNORMAL LOW (ref 22–32)
pCO2, Ven: 36.3 mmHg — ABNORMAL LOW (ref 44.0–60.0)
pCO2, Ven: 36.3 mmHg — ABNORMAL LOW (ref 44.0–60.0)
pH, Ven: 7.324 (ref 7.250–7.430)
pH, Ven: 7.318 (ref 7.250–7.430)
pO2, Ven: 23 mmHg — CL (ref 32.0–45.0)
pO2, Ven: 30 mmHg — CL (ref 32.0–45.0)

## 2019-03-13 LAB — PROTIME-INR
INR: >10 (ref 0.8–1.2)
INR: 6.2 (ref 0.8–1.2)
Prothrombin Time: >90 seconds — ABNORMAL HIGH (ref 11.4–15.2)
Prothrombin Time: 53.6 seconds — ABNORMAL HIGH (ref 11.4–15.2)

## 2019-03-13 LAB — CBC
HCT: 38 % — ABNORMAL LOW (ref 39.0–52.0)
Hemoglobin: 12.3 g/dL — ABNORMAL LOW (ref 13.0–17.0)
MCH: 31.5 pg (ref 26.0–34.0)
MCHC: 32.4 g/dL (ref 30.0–36.0)
MCV: 97.4 fL (ref 80.0–100.0)
Platelets: 89 10*3/uL — ABNORMAL LOW (ref 150–400)
RBC: 3.9 MIL/uL — ABNORMAL LOW (ref 4.22–5.81)
RDW: 16.6 % — ABNORMAL HIGH (ref 11.5–15.5)
WBC: 16.6 10*3/uL — ABNORMAL HIGH (ref 4.0–10.5)
nRBC: 2 % — ABNORMAL HIGH (ref 0.0–0.2)

## 2019-03-13 LAB — POCT I-STAT 7, (LYTES, BLD GAS, ICA,H+H)
Acid-base deficit: 8 mmol/L — ABNORMAL HIGH (ref 0.0–2.0)
Bicarbonate: 16.3 mmol/L — ABNORMAL LOW (ref 20.0–28.0)
Calcium, Ion: 0.8 mmol/L — CL (ref 1.15–1.40)
HCT: 41 % (ref 39.0–52.0)
Hemoglobin: 13.9 g/dL (ref 13.0–17.0)
O2 Saturation: 100 %
Potassium: 5.1 mmol/L (ref 3.5–5.1)
Sodium: 127 mmol/L — ABNORMAL LOW (ref 135–145)
TCO2: 17 mmol/L — ABNORMAL LOW (ref 22–32)
pCO2 arterial: 29.1 mmHg — ABNORMAL LOW (ref 32.0–48.0)
pH, Arterial: 7.357 (ref 7.350–7.450)
pO2, Arterial: 357 mmHg — ABNORMAL HIGH (ref 83.0–108.0)

## 2019-03-13 LAB — ABO/RH: ABO/RH(D): O POS

## 2019-03-13 LAB — LACTIC ACID, PLASMA
Lactic Acid, Venous: 8.4 mmol/L (ref 0.5–1.9)
Lactic Acid, Venous: >11 mmol/L (ref 0.5–1.9)

## 2019-03-13 LAB — APTT: aPTT: 63 seconds — ABNORMAL HIGH (ref 24–36)

## 2019-03-13 LAB — TYPE AND SCREEN
ABO/RH(D): O POS
Antibody Screen: NEGATIVE

## 2019-03-13 LAB — BASIC METABOLIC PANEL
Anion gap: 26 — ABNORMAL HIGH (ref 5–15)
BUN: 44 mg/dL — ABNORMAL HIGH (ref 8–23)
CO2: 10 mmol/L — ABNORMAL LOW (ref 22–32)
Calcium: 7 mg/dL — ABNORMAL LOW (ref 8.9–10.3)
Chloride: 93 mmol/L — ABNORMAL LOW (ref 98–111)
Creatinine, Ser: 6.77 mg/dL — ABNORMAL HIGH (ref 0.61–1.24)
GFR calc Af Amer: 9 mL/min — ABNORMAL LOW (ref >60)
GFR calc non Af Amer: 8 mL/min — ABNORMAL LOW (ref >60)
Glucose, Bld: 120 mg/dL — ABNORMAL HIGH (ref 70–99)
Potassium: 6.6 mmol/L (ref 3.5–5.1)
Sodium: 129 mmol/L — ABNORMAL LOW (ref 135–145)

## 2019-03-13 LAB — COMPREHENSIVE METABOLIC PANEL
ALT: 573 U/L — ABNORMAL HIGH (ref 0–44)
AST: 1078 U/L — ABNORMAL HIGH (ref 15–41)
Albumin: 2.6 g/dL — ABNORMAL LOW (ref 3.5–5.0)
Alkaline Phosphatase: 305 U/L — ABNORMAL HIGH (ref 38–126)
Anion gap: 22 — ABNORMAL HIGH (ref 5–15)
BUN: 36 mg/dL — ABNORMAL HIGH (ref 8–23)
CO2: 18 mmol/L — ABNORMAL LOW (ref 22–32)
Calcium: 7.2 mg/dL — ABNORMAL LOW (ref 8.9–10.3)
Chloride: 92 mmol/L — ABNORMAL LOW (ref 98–111)
Creatinine, Ser: 6.57 mg/dL — ABNORMAL HIGH (ref 0.61–1.24)
GFR calc Af Amer: 10 mL/min — ABNORMAL LOW (ref >60)
GFR calc non Af Amer: 8 mL/min — ABNORMAL LOW (ref >60)
Glucose, Bld: 142 mg/dL — ABNORMAL HIGH (ref 70–99)
Potassium: 5.3 mmol/L — ABNORMAL HIGH (ref 3.5–5.1)
Sodium: 132 mmol/L — ABNORMAL LOW (ref 135–145)
Total Bilirubin: 6.1 mg/dL — ABNORMAL HIGH (ref 0.3–1.2)
Total Protein: 6.8 g/dL (ref 6.5–8.1)

## 2019-03-13 LAB — COOXEMETRY PANEL
Carboxyhemoglobin: 0.6 % (ref 0.5–1.5)
Methemoglobin: 0.7 % (ref 0.0–1.5)
O2 Saturation: 52 %
Total hemoglobin: 11.7 g/dL — ABNORMAL LOW (ref 12.0–16.0)

## 2019-03-13 LAB — MRSA PCR SCREENING: MRSA by PCR: NEGATIVE

## 2019-03-13 LAB — PROCALCITONIN: Procalcitonin: 2.55 ng/mL

## 2019-03-13 LAB — MAGNESIUM: Magnesium: 2.2 mg/dL (ref 1.7–2.4)

## 2019-03-13 LAB — PHOSPHORUS: Phosphorus: 8.1 mg/dL — ABNORMAL HIGH (ref 2.5–4.6)

## 2019-03-13 LAB — SARS CORONAVIRUS 2 (TAT 6-24 HRS): SARS Coronavirus 2: NEGATIVE

## 2019-03-13 LAB — GLUCOSE, CAPILLARY: Glucose-Capillary: 150 mg/dL — ABNORMAL HIGH (ref 70–99)

## 2019-03-13 MED ORDER — CALCIUM CHLORIDE 10 % IV SOLN
INTRAVENOUS | Status: AC
  Filled 2019-03-13: qty 10

## 2019-03-13 MED ORDER — KETAMINE HCL-SODIUM CHLORIDE 100-0.9 MG/10ML-% IV SOSY
1.5000 mg/kg | PREFILLED_SYRINGE | Freq: Once | INTRAVENOUS | Status: DC
  Filled 2019-03-13 (×2): qty 20

## 2019-03-13 MED ORDER — DOBUTAMINE IN D5W 4-5 MG/ML-% IV SOLN
2.5000 ug/kg/min | INTRAVENOUS | Status: DC
  Administered 2019-03-13: 2.5 ug/kg/min via INTRAVENOUS
  Filled 2019-03-13: qty 250

## 2019-03-13 MED ORDER — EPINEPHRINE HCL 5 MG/250ML IV SOLN IN NS
0.5000 ug/min | INTRAVENOUS | Status: DC
  Administered 2019-03-13: 2 ug/min via INTRAVENOUS
  Filled 2019-03-13 (×2): qty 250

## 2019-03-13 MED ORDER — MIDAZOLAM HCL 2 MG/2ML IJ SOLN
INTRAMUSCULAR | Status: AC
  Filled 2019-03-13: qty 2

## 2019-03-13 MED ORDER — FENTANYL CITRATE (PF) 100 MCG/2ML IJ SOLN
25.0000 ug | INTRAMUSCULAR | Status: DC | PRN
  Administered 2019-03-13: 25 ug via INTRAVENOUS
  Filled 2019-03-13: qty 2

## 2019-03-13 MED ORDER — CHLORHEXIDINE GLUCONATE 0.12 % MT SOLN
15.0000 mL | Freq: Two times a day (BID) | OROMUCOSAL | Status: DC
  Administered 2019-03-13: 10:00:00 15 mL via OROMUCOSAL

## 2019-03-13 MED ORDER — CHLORHEXIDINE GLUCONATE CLOTH 2 % EX PADS
6.0000 | MEDICATED_PAD | Freq: Every day | CUTANEOUS | Status: DC
  Administered 2019-03-13: 6 via TOPICAL

## 2019-03-13 MED ORDER — VITAMIN K1 10 MG/ML IJ SOLN
10.0000 mg | INTRAVENOUS | Status: AC
  Administered 2019-03-13: 10 mg via INTRAVENOUS
  Filled 2019-03-13: qty 1

## 2019-03-13 MED ORDER — SODIUM CHLORIDE 0.9% IV SOLUTION: Freq: Once | INTRAVENOUS | Status: DC

## 2019-03-13 MED ORDER — EPINEPHRINE 1 MG/10ML IJ SOSY
PREFILLED_SYRINGE | INTRAMUSCULAR | Status: AC
  Filled 2019-03-13: qty 10

## 2019-03-13 MED ORDER — SODIUM BICARBONATE 8.4 % IV SOLN
INTRAVENOUS | Status: AC
  Administered 2019-03-13: 100 meq
  Filled 2019-03-13: qty 50

## 2019-03-13 MED ORDER — "THROMBI-PAD 3""X3"" EX PADS"
1.0000 | MEDICATED_PAD | Freq: Once | CUTANEOUS | Status: AC
  Administered 2019-03-13: 1 via TOPICAL
  Filled 2019-03-13: qty 1

## 2019-03-13 MED ORDER — ORAL CARE MOUTH RINSE
15.0000 mL | Freq: Two times a day (BID) | OROMUCOSAL | Status: DC
  Administered 2019-03-13: 11:00:00 15 mL via OROMUCOSAL

## 2019-03-13 MED ORDER — LIDOCAINE HCL (PF) 1 % IJ SOLN
INTRAMUSCULAR | Status: AC
  Administered 2019-03-13: 04:00:00 5 mL
  Filled 2019-03-13: qty 5

## 2019-03-13 MED ORDER — PROTHROMBIN COMPLEX CONC HUMAN 500 UNITS IV KIT: 5000.0000 [IU] | PACK | Status: DC

## 2019-03-13 MED ORDER — NOREPINEPHRINE 16 MG/250ML-% IV SOLN
0.0000 ug/min | INTRAVENOUS | Status: DC
  Filled 2019-03-13: qty 250

## 2019-03-13 MED ORDER — KETAMINE HCL-SODIUM CHLORIDE 100-0.9 MG/10ML-% IV SOSY
150.0000 mg | PREFILLED_SYRINGE | Freq: Once | INTRAVENOUS | Status: DC
  Filled 2019-03-13: qty 20

## 2019-03-13 MED ORDER — VASOPRESSIN 20 UNIT/ML IV SOLN
0.0300 [IU]/min | INTRAVENOUS | Status: DC
  Administered 2019-03-13: 09:00:00 0.03 [IU]/min via INTRAVENOUS
  Filled 2019-03-13: qty 2

## 2019-03-13 MED ORDER — LIDOCAINE HCL 1 % IJ SOLN: 2.0000 mL | Freq: Once | INTRAMUSCULAR | Status: AC

## 2019-03-13 MED ORDER — FENTANYL CITRATE (PF) 100 MCG/2ML IJ SOLN
INTRAMUSCULAR | Status: AC
  Filled 2019-03-13: qty 2

## 2019-03-13 MED ORDER — FENTANYL CITRATE (PF) 100 MCG/2ML IJ SOLN
25.0000 ug | Freq: Once | INTRAMUSCULAR | Status: AC
  Administered 2019-03-13: 25 ug via INTRAVENOUS
  Filled 2019-03-13: qty 2

## 2019-03-13 MED ORDER — NOREPINEPHRINE 4 MG/250ML-% IV SOLN
0.0000 ug/min | INTRAVENOUS | Status: DC
  Administered 2019-03-13: 05:00:00 25 ug/min via INTRAVENOUS
  Administered 2019-03-13: 02:00:00 18 ug/min via INTRAVENOUS
  Filled 2019-03-13 (×2): qty 250

## 2019-03-14 LAB — POCT I-STAT 7, (LYTES, BLD GAS, ICA,H+H)
Acid-base deficit: 12 mmol/L — ABNORMAL HIGH (ref 0.0–2.0)
Bicarbonate: 13 mmol/L — ABNORMAL LOW (ref 20.0–28.0)
Calcium, Ion: 0.7 mmol/L — CL (ref 1.15–1.40)
HCT: 40 % (ref 39.0–52.0)
Hemoglobin: 13.6 g/dL (ref 13.0–17.0)
O2 Saturation: 99 %
Patient temperature: 98.2
Potassium: 8 mmol/L (ref 3.5–5.1)
Sodium: 128 mmol/L — ABNORMAL LOW (ref 135–145)
TCO2: 14 mmol/L — ABNORMAL LOW (ref 22–32)
pCO2 arterial: 26.6 mmHg — ABNORMAL LOW (ref 32.0–48.0)
pH, Arterial: 7.296 — ABNORMAL LOW (ref 7.350–7.450)
pO2, Arterial: 180 mmHg — ABNORMAL HIGH (ref 83.0–108.0)

## 2019-03-14 LAB — BPAM FFP
Blood Product Expiration Date: 202011112359
Blood Product Expiration Date: 202011112359
Blood Product Expiration Date: 202011112359
Blood Product Expiration Date: 202011112359
ISSUE DATE / TIME: 202011061948
ISSUE DATE / TIME: 202011061948
ISSUE DATE / TIME: 202011090437
Unit Type and Rh: 5100
Unit Type and Rh: 5100
Unit Type and Rh: 5100
Unit Type and Rh: 5100

## 2019-03-14 LAB — PREPARE FRESH FROZEN PLASMA
Unit division: 0
Unit division: 0

## 2019-03-16 ENCOUNTER — Ambulatory Visit: Payer: Medicare Other | Admitting: Cardiology

## 2019-03-17 ENCOUNTER — Encounter (HOSPITAL_COMMUNITY): Payer: Self-pay | Admitting: *Deleted

## 2019-03-17 DIAGNOSIS — E871 Hypo-osmolality and hyponatremia: Secondary | ICD-10-CM | POA: Diagnosis present

## 2019-03-17 DIAGNOSIS — D72829 Elevated white blood cell count, unspecified: Secondary | ICD-10-CM | POA: Diagnosis present

## 2019-03-17 DIAGNOSIS — E872 Acidosis, unspecified: Secondary | ICD-10-CM | POA: Diagnosis present

## 2019-03-17 DIAGNOSIS — D689 Coagulation defect, unspecified: Secondary | ICD-10-CM | POA: Diagnosis present

## 2019-03-17 DIAGNOSIS — K7200 Acute and subacute hepatic failure without coma: Secondary | ICD-10-CM | POA: Diagnosis present

## 2019-03-17 LAB — CULTURE, BLOOD (ROUTINE X 2)
Culture: NO GROWTH
Culture: NO GROWTH
Special Requests: ADEQUATE

## 2019-03-17 NOTE — Progress Notes (Signed)
DC completed and signed by Dr Haroldine Laws, [facility] is aware and will p/u today

## 2019-03-22 ENCOUNTER — Ambulatory Visit: Payer: Medicare Other | Admitting: Cardiology

## 2019-04-04 NOTE — Progress Notes (Signed)
While receiving report, RN paged Dr. Haroldine Laws regarding patient's condition. Orders for Epi gtt and a lactic given verbally. Dr. Haroldine Laws came to bedside to assess patient. Family called. Spoke with wife Helene Kelp.   RN will continue to monitor patient.

## 2019-04-04 NOTE — Consult Note (Signed)
Renal Service Consult Note [facility]  [name] 04-[birth year] Sol Blazing Requesting Physician:  Dr Duwayne Heck, Delane Ginger.   Reason for Consult:  ESRD pt w/  HPI: The patient is a 63 y.o. year-old w/ hx of severe bivent CHF, recent ESRD started HD 10/23/ 20, hx HTN , severe pHTN, PAF, IDDM, ICD, gout, hx CVA presented w/ chest pain, severe weakness in legs/ arms, unable to walk, SOB despite home O2. Had HD on Sat (TTS sched). In ED bp was in 70's, 500cc bolus did not improve BP. INR was > 10, trop > 1000. Admitted to ICU, started IV abx, got vit K, bipap for resp distress.  Started on mult drips including dobutamine, epi, levo. AG up to 24 this am, CO2 10.  Seen by cards/ CHF team who felt this is cardiogenic shock, requiring mult pressors and that CRRT would not be tolerated.  Seen by CCM as well and they had discussions w/ family and pt was made DNR w/ plans for comfort care.    Past Medical History  Past Medical History:  Diagnosis Date  . CAD (coronary artery disease)    a. Initial nonobst by cath 2009. b. Cath 01/2013 in setting of VT storm: obstructive distal Cx disease (small and terminates in the AV groove, unlikely to cause significant ischemia or electrical instability), nonobstructive RCA disease, EF 15-20%.   . Cerebrovascular accident [facility])    a. 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 Chronic systolic CHF (congestive heart failure) ([place])    a. Likely NICM (out of proportion to CAD). b. 2009 - EF 25-30% by echo, 01/2013: 15-20% by cath. c. 11/2014 Echo: EF 35-40%, Gr1 DD, mild MR, mod TR, PASP 2mHg.  . CKD (chronic kidney disease), stage II   . Dyslipidemia   . Gout   . HTN (hypertension)   . Hypokalemia   . ICD (implantable cardiac defibrillator) in place   . Insulin dependent diabetes mellitus   . Lipoma   . Nonischemic cardiomyopathy ([place]    a.  11/2014 Echo: EF 35-40%, Gr1 DD, mild MR, mod TR, PASP 448mg.  . OSA (obstructive sleep apnea)    does not wear  cpap  . PA[place] ([place]mal [place] fibrillation) ([place]   a. Noted 05/2008 by EKG;  b. CHA2DS2VASc = 5-6-->coumadin.  . Paroxysmal [place] ([place]   a. s/p St. Jude ICD 2007. b. H/o paroxysmal VT/VF including VT storm 12/2012 admission prompting amio initiation;  c. 01/2013 ICD upgrade SJM 1411-36Q Ellipse VR single lead ICD.  . [place] HTN ([place]   a. Mild by cath 01/2013.   Past Surgical History  Past Surgical History:  Procedure Laterality Date  . AV FISTULA PLACEMENT Right 03/02/2019   Procedure: Arteriovenous (Av) Fistula Creation right upper arm;  Surgeon: CaWaynetta SandyMD;  [place] Service: Vascular;  Laterality: Right;  . CARDIAC CATHETERIZATION     Nonobstructive coronary disease 2009  . CARDIAC DEFIBRILLATOR PLACEMENT     ICD-St. Jude  . CARDIOVERSION N/A 01/03/2019   Procedure: CARDIOVERSION;  Surgeon: BeJolaine ArtistMD;  [facility];  Service: Cardiovascular;  Laterality: N/A;  . CENTRAL LINE INSERTION  02/16/2019   Procedure: CENTRAL LINE INSERTION;  Surgeon: BeJolaine ArtistMD;  [place];  Service: Cardiovascular;;  . IMPLANTABLE CARDIOVERTER DEFIBRILLATOR (ICD) GENERATOR CHANGE N/A 01/15/2014   Procedure: ICD GENERATOR CHANGE;  Surgeon: GrEvans LanceMD;  [facility];  Service:  Cardiovascular;  Laterality: N/A;  . IR FLUORO GUIDE CV LINE RIGHT  02/24/2019  . IR US GUIDE VASC ACCESS RIGHT  02/24/2019  . LEFT AND RIGHT HEART CATHETERIZATION WITH CORONARY ANGIOGRAM N/A 01/03/2013   Procedure: LEFT AND RIGHT HEART CATHETERIZATION WITH CORONARY ANGIOGRAM;  Surgeon: Peter M Martinique, MD;  [facility] C[facility] LAB;  Service: Cardiovascular;  Laterality: N/A;  . LIPOMA EXCISION    . RIGHT HEART CATH N/A 02/16/2019   Procedure: RIGHT HEART CATH;  Surgeon: Jolaine Artist, MD;  Location: [place] CV LAB;  Service: Cardiovascular;  Laterality: N/A;  . RIGHT/LEFT HEART CATH AND CORONARY ANGIOGRAPHY N/A 10/04/2017    Procedure: RIGHT/LEFT HEART CATH AND CORONARY ANGIOGRAPHY;  Surgeon: Larey Dresser, MD;  Location: [place] CV LAB;  Service: Cardiovascular;  Laterality: N/A;  . RIGHT/LEFT HEART CATH AND CORONARY ANGIOGRAPHY N/A 12/06/2018   Procedure: RIGHT/LEFT HEART CATH AND CORONARY ANGIOGRAPHY;  Surgeon: Jolaine Artist, MD;  Location: [place] CV LAB;  Service: Cardiovascular;  Laterality: N/A;   Family History  Family History  Problem Relation Age of Onset  . Coronary artery disease Mother   . Heart attack Mother   . Heart attack Maternal Uncle   . Heart attack Maternal Grandmother    Social History  reports that he quit smoking about 32 years ago. He has never used smokeless tobacco. He reports that he does not drink alcohol or use drugs. Allergies No Known Allergies Home medications Prior to Admission medications   Medication Sig Start Date End Date Taking? Authorizing Provider  acetaminophen (TYLENOL) 500 MG tablet Take 1,000 mg by mouth every 6 (six) hours as needed for headache (pain).   Yes [provider]  amiodarone (PACERONE) 200 MG tablet Take 1 tablet (200 mg total) by mouth 2 (two) times daily. 03/06/19  Yes Bensimhon, Shaune Pascal, MD  atorvastatin (LIPITOR) 80 MG tablet Take 80 mg by mouth at bedtime.    Yes [provider]  chlorproMAZINE (THORAZINE) 25 MG tablet Take 25 mg by mouth 3 (three) times daily as needed for hiccoughs.  03/10/19  Yes [provider]  midodrine (PROAMATINE) 10 MG tablet Take 1 tablet (10 mg total) by mouth 3 (three) times daily with meals. 03/03/19  Yes Lyda Jester M, PA-C  multivitamin (RENA-VIT) TABS tablet Take 1 tablet by mouth daily.   Yes [provider]  OXYGEN Inhale 2-3 L into the lungs continuous. 2L continuous; 3L on pulse machine    Yes [provider]  warfarin (COUMADIN) 5 MG tablet Take 1 tablet (5 mg total) by mouth one time only at 6 PM. Patient taking differently: Take 5 mg by mouth at  bedtime.  03/03/19  Yes Consuelo Pandy, PA-C   Liver Function Tests Recent Labs  Lab 03/23/2019 1857 2019-03-23 0247  AST 554* 1,078*  ALT 315* 573*  ALKPHOS 233* 305*  BILITOT 4.5* 6.1*  PROT 6.3* 6.8  ALBUMIN 2.1* 2.6*   No results for input(s): LIPASE, AMYLASE in the last 168 hours. CBC Recent Labs  Lab 03/17/2019 1645  2019/03/23 0247 23-Mar-2019 0349 2019-03-23 0659  WBC 14.6*  --  16.6*  --   --   NEUTROABS 12.7*  --   --   --   --   HGB 12.6*   < > 12.3* 13.9 13.6  HCT 38.5*   < > 38.0* 41.0 40.0  MCV 96.3  --  97.4  --   --   PLT 108*  --  89*  --   --    < > = values in this interval not displayed.   Basic Metabolic Panel Recent Labs  Lab 04/02/2019 1645 Apr 04, 2019 0128 2019-04-04 0228 April 04, 2019 0247 Apr 04, 2019 0349 04-04-19 0659 2019/04/04 0707  NA 130* 130* 130* 132* 127* 128* 129*  K 4.8 5.0 5.2* 5.3* 5.1 8.0* 6.6*  CL 90*  --   --  92*  --   --  93*  CO2 22  --   --  18*  --   --  10*  GLUCOSE 140*  --   --  142*  --   --  120*  BUN 33*  --   --  36*  --   --  44*  CREATININE 6.25*  --   --  6.57*  --   --  6.77*  CALCIUM 7.7*  --   --  7.2*  --   --  7.0*  PHOS  --   --   --  8.1*  --   --   --    Iron/TIBC/Ferritin/ %Sat    Component Value Date/Time   IRON 72 06/17/2009 1025   IRONPCTSAT 21.5 06/17/2009 1025    Vitals:   2019-04-04 0830 2019-04-04 0845 2019/04/04 0900 April 04, 2019 0915  BP: (!) 83/58 (!) 83/61 (!) 39/24 (!) 55/23  Pulse:  92    Resp: 17 (!) 22 (!) 22 20  Temp:      TempSrc:      SpO2:  (!) 74%    Weight:      Height:        Exam Gen lethargic, arouses to voice, on bipap No rash, cyanosis or gangrene Sclera anicteric, throat not seen   No jvd or bruits Chest decreased in bases RRR no MRG Abd soft ntnd no mass or ascites +bs  MS no joint effusions or deformity Ext diffuse 2- 3+ edema, no wounds or ulcers Neuro is lethargic, nonfocal    Home meds:  - amiodarone 200 bid  - atorvastatin 80 hs  - midodrine 10 tid  - warfarin 5 hs  -  O2 2-3L [place]   - prn's/ vitamins/ supplements    CXR - Diffuse bilateral interstitial prominence again noted with slight improvement from prior exam. Small left pleural effusion noted.      IV dobutamine 11.5, epi 2 ug/min, levo 50 ug/min, vaso gtt's   BP's 50's- 80's here on mult drips    Na 129  K 6.6  CO2 10  Anion gap 26  Lactic 8.4 BNP 2800 trop 1160   BUN 44       Cr 6.77   ABG 7.29/ 27/ 180    INR 6.2, PTT 53 sec    wBC 16k  Hb 12  plt 89   Outpt HD: TTS [facility], started 02/24/19  4:15  100kg   400/800   2/2.5 bath  Hep none    R TDC  - venofer 50- 100  - outpt HD pre BP's 100/ 70 range , lowest BP's in 70's      Assessment/ Plan: 1. Shock - hx of severe bivent CHF. Now in severe cardiogenic shock, mult gtt's and low BP's. Pt DNR now. Per cards prognosis grim.  2. ESRD - recent start to HD.  Would not tolerate HD or CRRT in this condition. Poor prognosis.  Will follow.   3. Met acidosis - likely lactic acidosis from hypoperfusion 4. Resp failure - related to heart failure, on bipap.  Kelly Splinter  MD 31-Mar-2019, 10:48 AM

## 2019-04-04 NOTE — Consult Note (Signed)
Advanced Heart Failure Team Consult Note   Primary Physician: Biagio Borg, MD PCP-Cardiologist:  Minus Breeding, MD  Reason for Consultation: Cardiogenic Shock  HPI:    [name] is seen today for evaluation of cardiogenic shock at the request of Dr. Lamonte Sakai    63 y/o male with h/o severe biventricular HF EF 20%, ESRD recently admitted with recurrent HF (R>L) and marked volume overload. Failed to diurese. Renal failure progressed and started on HD with 50 pound volume removal.  Readmitted overnight with profound cardiogenic shock and respiratory failure.   Now on Bipap, Epi 20, NE 50, dobutamine 5. With SBP in 50s. He is awake on Bipap but somewhat confused. Lactate 8.4.   . Review of Systems:  Unable to obtain due to condition  Home Medications Prior to Admission medications   Medication Sig Start Date End Date Taking? Authorizing Provider  acetaminophen (TYLENOL) 500 MG tablet Take 1,000 mg by mouth every 6 (six) hours as needed for headache (pain).   Yes [provider]  amiodarone (PACERONE) 200 MG tablet Take 1 tablet (200 mg total) by mouth 2 (two) times daily. 03/06/19  Yes Fidencio Duddy, Shaune Pascal, MD  atorvastatin (LIPITOR) 80 MG tablet Take 80 mg by mouth at bedtime.    Yes [provider]  chlorproMAZINE (THORAZINE) 25 MG tablet Take 25 mg by mouth 3 (three) times daily as needed for hiccoughs.  03/10/19  Yes [provider]  midodrine (PROAMATINE) 10 MG tablet Take 1 tablet (10 mg total) by mouth 3 (three) times daily with meals. 03/03/19  Yes Lyda Jester M, PA-C  multivitamin (RENA-VIT) TABS tablet Take 1 tablet by mouth daily.   Yes [provider]  OXYGEN Inhale 2-3 L into the lungs continuous. 2L continuous; 3L on pulse machine    Yes [provider]  warfarin (COUMADIN) 5 MG tablet Take 1 tablet (5 mg total) by mouth one time only at 6 PM. Patient taking differently: Take 5 mg by mouth at bedtime.  03/03/19  Yes  Consuelo Pandy, PA-C    Past Medical History: Past Medical History:  Diagnosis Date  . CAD (coronary artery disease)    a. Initial nonobst by cath 2009. b. Cath 01/2013 in setting of VT storm: obstructive distal Cx disease (small and terminates in the AV groove, unlikely to cause significant ischemia or electrical instability), nonobstructive RCA disease, EF 15-20%.   . Cerebrovascular accident [facility])    a. 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 Chronic systolic CHF (congestive heart failure) ([place])    a. Likely NICM (out of proportion to CAD). b. 2009 - EF 25-30% by echo, 01/2013: 15-20% by cath. c. 11/2014 Echo: EF 35-40%, Gr1 DD, mild MR, mod TR, PASP 49mmHg.  . CKD (chronic kidney disease), stage II   . Dyslipidemia   . Gout   . HTN (hypertension)   . Hypokalemia   . ICD (implantable cardiac defibrillator) in place   . Insulin dependent diabetes mellitus   . Lipoma   . Nonischemic cardiomyopathy ([place])    a.  11/2014 Echo: EF 35-40%, Gr1 DD, mild MR, mod TR, PASP 17mmHg.  . OSA (obstructive sleep apnea)    does not wear cpap  . PAF (paroxysmal atrial fibrillation) ([place])    a. Noted 05/2008 by EKG;  b. CHA2DS2VASc = 5-6-->coumadin.  . Paroxysmal VT ([place])    a. s/p St. Jude ICD 2007. b. H/o paroxysmal VT/VF including VT storm 12/2012 admission prompting amio  initiation;  c. 01/2013 ICD upgrade SJM 1411-36Q Ellipse VR single lead ICD.  [name] Pulmonary HTN ([place])    a. Mild by cath 01/2013.    Past Surgical History: Past Surgical History:  Procedure Laterality Date  . AV FISTULA PLACEMENT Right 03/02/2019   Procedure: Arteriovenous (Av) Fistula Creation right upper arm;  Surgeon: Waynetta Sandy, MD;  Location: [place];  Service: Vascular;  Laterality: Right;  . CARDIAC CATHETERIZATION     Nonobstructive coronary disease 2009  . CARDIAC DEFIBRILLATOR PLACEMENT     ICD-St. Jude  . CARDIOVERSION N/A 01/03/2019   Procedure: CARDIOVERSION;  Surgeon: Jolaine Artist, MD;  Location:  [facility] ENDOSCOPY;  Service: Cardiovascular;  Laterality: N/A;  . CENTRAL LINE INSERTION  02/16/2019   Procedure: CENTRAL LINE INSERTION;  Surgeon: Jolaine Artist, MD;  Location: [place] CV LAB;  Service: Cardiovascular;;  . IMPLANTABLE CARDIOVERTER DEFIBRILLATOR (ICD) GENERATOR CHANGE N/A 01/15/2014   Procedure: ICD GENERATOR CHANGE;  Surgeon: Evans Lance, MD;  Location: [facility] CATH LAB;  Service: Cardiovascular;  Laterality: N/A;  . IR FLUORO GUIDE CV LINE RIGHT  02/24/2019  . IR US GUIDE VASC ACCESS RIGHT  02/24/2019  . LEFT AND RIGHT HEART CATHETERIZATION WITH CORONARY ANGIOGRAM N/A 01/03/2013   Procedure: LEFT AND RIGHT HEART CATHETERIZATION WITH CORONARY ANGIOGRAM;  Surgeon: Peter M Martinique, MD;  Location: [facility] CATH LAB;  Service: Cardiovascular;  Laterality: N/A;  . LIPOMA EXCISION    . RIGHT HEART CATH N/A 02/16/2019   Procedure: RIGHT HEART CATH;  Surgeon: Jolaine Artist, MD;  Location: [place] CV LAB;  Service: Cardiovascular;  Laterality: N/A;  . RIGHT/LEFT HEART CATH AND CORONARY ANGIOGRAPHY N/A 10/04/2017   Procedure: RIGHT/LEFT HEART CATH AND CORONARY ANGIOGRAPHY;  Surgeon: Larey Dresser, MD;  Location: [place] CV LAB;  Service: Cardiovascular;  Laterality: N/A;  . RIGHT/LEFT HEART CATH AND CORONARY ANGIOGRAPHY N/A 12/06/2018   Procedure: RIGHT/LEFT HEART CATH AND CORONARY ANGIOGRAPHY;  Surgeon: Jolaine Artist, MD;  Location: [place] CV LAB;  Service: Cardiovascular;  Laterality: N/A;    Family History: Family History  Problem Relation Age of Onset  . Coronary artery disease Mother   . Heart attack Mother   . Heart attack Maternal Uncle   . Heart attack Maternal Grandmother     Social History: Social History   Socioeconomic History  . Marital status: Married    Spouse name: Not on file  . Number of children: 2  . Years of education: Not on file  . Highest education level: Not on file  Occupational History  . Occupation: medically retired due to  heart failure  Social Needs  . Financial resource strain: Not on file  . Food insecurity    Worry: Not on file    Inability: Not on file  . Transportation needs    Medical: Yes    Non-medical: No  Tobacco Use  . Smoking status: Former Smoker    Quit date: 05/04/1986    Years since quitting: 32.8  . Smokeless tobacco: Never Used  . Tobacco comment: Quit smoking 30 yrs ago. Smoked as teenager less than 1/2 ppd. Smoked x 4 years.  Substance and Sexual Activity  . Alcohol use: No    Comment: occasionally  . Drug use: No  . Sexual activity: Never  Lifestyle  . Physical activity    Days per week: 0 days    Minutes per session: 0 min  . Stress: Not at all  Relationships  .  Social Herbalist on phone: Twice a week    Gets together: Never    Attends religious service: Never    Active member of club or organization: No    Attends meetings of clubs or organizations: Never    Relationship status: Married  Other Topics Concern  . Not on file  Social History Narrative  . Not on file    Allergies:  No Known Allergies  Objective:    Vital Signs:   Temp:  [97.3 F (36.3 C)-98.6 F (37 C)] 98.2 F (36.8 C) (11/09 0643) Pulse Rate:  [25-104] 98 (11/09 0738) Resp:  [18-39] 26 (11/09 0738) BP: (45-137)/(0-100) 51/45 (11/09 0738) SpO2:  [53 %-98 %] 96 % (11/09 0738) Arterial Line BP: (46-57)/(43-51) 55/50 (11/09 0643) FiO2 (%):  [35 %-100 %] 70 % (11/09 0738) Weight:  [100.7 kg] 100.7 kg (11/08 1642)    Weight change: Filed Weights   03/18/2019 1642  Weight: 100.7 kg    Intake/Output:   Intake/Output Summary (Last 24 hours) at 03/14/2019 0802 Last data filed at 03-14-19 0643 Gross per 24 hour  Intake 1997.6 ml  Output 0 ml  Net 1997.6 ml      Physical Exam    General:  On bipap. Critically ill  HEENT: normal Neck: supple. JVP to jaw . Carotids 2+ bilat; no bruits. No lymphadenopathy or thyromegaly appreciated. Cor: PMI nondisplaced. Regular rate &  rhythm.+ s3 Lungs: + crackles Abdomen: soft, nontender, nondistended. No hepatosplenomegaly. No bruits or masses. Good bowel sounds. Extremities: no cyanosis, clubbing, rash, edema very cool  Neuro: alert & orientedx3, cranial nerves grossly intact. moves all 4 extremities w/o difficulty. Affect pleasant   Telemetry   Sinus tach 90-100 Personally reviewed   Labs   Basic Metabolic Panel: Recent Labs  Lab 03/23/2019 1645  03-14-2019 0228 03-14-19 0247 14-Mar-2019 0349 03-14-19 0659 Mar 14, 2019 0707  NA 130*   < > 130* 132* 127* 128* 129*  K 4.8   < > 5.2* 5.3* 5.1 8.0* 6.6*  CL 90*  --   --  92*  --   --  93*  CO2 22  --   --  18*  --   --  10*  GLUCOSE 140*  --   --  142*  --   --  120*  BUN 33*  --   --  36*  --   --  44*  CREATININE 6.25*  --   --  6.57*  --   --  6.77*  CALCIUM 7.7*  --   --  7.2*  --   --  7.0*  MG  --   --   --  2.2  --   --   --   PHOS  --   --   --  8.1*  --   --   --    < > = values in this interval not displayed.    Liver Function Tests: Recent Labs  Lab 03/26/2019 1857 03-14-2019 0247  AST 554* 1,078*  ALT 315* 573*  ALKPHOS 233* 305*  BILITOT 4.5* 6.1*  PROT 6.3* 6.8  ALBUMIN 2.1* 2.6*   No results for input(s): LIPASE, AMYLASE in the last 168 hours. No results for input(s): AMMONIA in the last 168 hours.  CBC: Recent Labs  Lab 03/06/2019 1645 14-Mar-2019 0128 03/14/19 0228 03-14-19 0247 14-Mar-2019 0349 March 14, 2019 0659  WBC 14.6*  --   --  16.6*  --   --   NEUTROABS 12.7*  --   --   --   --   --  HGB 12.6* 14.3 14.6 12.3* 13.9 13.6  HCT 38.5* 42.0 43.0 38.0* 41.0 40.0  MCV 96.3  --   --  97.4  --   --   PLT 108*  --   --  89*  --   --     Cardiac Enzymes: No results for input(s): CKTOTAL, CKMB, CKMBINDEX, TROPONINI in the last 168 hours.  BNP: BNP (last 3 results) Recent Labs    01/02/19 1552 02/13/19 1114 03/24/2019 1924  BNP 2,154.7* 2,637.3* 2,298.7*    ProBNP (last 3 results) Recent Labs    11/01/18 1229  PROBNP 2,158.0*      CBG: Recent Labs  Lab April 07, 2019 0546  GLUCAP 150*    Coagulation Studies: Recent Labs    03/19/2019 1645 03/14/2019 1924 04/07/19 0103 04/07/19 0621  LABPROT >90.0* >90.0* >90.0* 53.6*  INR >10.0* >10.0* >10.0* 6.2*     Imaging   Dg Chest Port 1 View  Result Date: April 07, 2019 CLINICAL DATA:  Intubation.  Chest pain. EXAM: PORTABLE CHEST 1 VIEW COMPARISON:  03/25/2019. FINDINGS: Stable position right dual-lumen catheter. Cardiac pacer in stable position. Cardiomegaly unchanged. Bilateral interstitial prominence with slight improvement from prior exam. Small left pleural effusion cannot be excluded. No pneumothorax. IMPRESSION: 1.  Right dual-lumen catheter in stable position. 2. Cardiac pacer stable position. Cardiomegaly again noted without interim change. Diffuse bilateral interstitial prominence again noted with slight improvement from prior exam. Small left pleural effusion noted. Electronically Signed   By: Marcello Moores  Register   On: 2019-04-07 07:31   Dg Chest Portable 1 View  Result Date: 03/11/2019 CLINICAL DATA:  Left chest pain EXAM: PORTABLE CHEST 1 VIEW COMPARISON:  02/13/2019 FINDINGS: Interval placement of right neck large bore multi lumen vascular catheter, tip near the superior cavoatrial junction. Cardiomegaly. Left chest pacer defibrillator. Diffuse interstitial pulmonary opacity bilaterally, similar to prior examination. No focal airspace opacity. IMPRESSION: 1. Interval placement of right neck large bore multi lumen vascular catheter, tip near the superior cavoatrial junction. 2. Diffuse interstitial pulmonary opacity bilaterally, similar to prior examination and likely edema in the setting of cardiomegaly. No focal airspace opacity. Electronically Signed   By: Eddie Candle M.D.   On: 03/16/2019 17:23      Medications:     Current Medications: . sodium chloride   Intravenous Once  . calcium chloride      . Chlorhexidine Gluconate Cloth  6 each Topical Daily  .  fentaNYL      . ketamine (KETALAR) injection 10mg /mL (IV use)  1.5 mg/kg Intravenous Once  . midazolam      . pantoprazole (PROTONIX) IV  40 mg Intravenous Daily     Infusions: . sodium chloride 250 mL (03/21/2019 2100)  . DOBUTamine 11.5 mcg/kg/min (07-Apr-2019 0641)  . epinephrine 2 mcg/min (04-07-19 0727)  . norepinephrine (LEVOPHED) Adult infusion    . vasopressin (PITRESSIN) infusion - *FOR SHOCK*          Assessment/Plan   1. Cardiogenic shock 2. Acute on chronic systolic HF with severe biventricular HF EF 20% 3. ESRD with recent start HD 4. Pulmonary HTN 5. DM2 6. Hyperkalemia 7. Acute hypoxic respiratory failure  He is actively dying form cardiogenic shock. He is on multiple pressors including high-dose epi, NE and dobutamine and SBP still in 50s. He currently cannot tolerate CVVHD.   Both Dr. Lamonte Sakai and I had a long talk with him and his wife about the fact that he may not make it through the day.  We will continue  supportive care and continue to discuss Code Status with him and his wife. At this point, I think comfort care is only viable option.   CRITICAL CARE Performed by: Glori Bickers  Total critical care time: 55 minutes  Critical care time was exclusive of separately billable procedures and treating other patients.  Critical care was necessary to treat or prevent imminent or life-threatening deterioration.  Critical care was time spent personally by me (independent of midlevel providers or residents) on the following activities: development of treatment plan with patient and/or surrogate as well as nursing, discussions with consultants, evaluation of patient's response to treatment, examination of patient, obtaining history from patient or surrogate, ordering and performing treatments and interventions, ordering and review of laboratory studies, ordering and review of radiographic studies, pulse oximetry and re-evaluation of patient's condition.    Length  of Stay: 1  Glori Bickers, MD  04-07-2019, 8:02 AM  Advanced Heart Failure Team Pager [phone] (M-F; 7a - 4p)  Please contact [facility] for night-coverage after hours (4p -7a ) and weekends on [email or web address]

## 2019-04-04 NOTE — Progress Notes (Addendum)
NAME:  [name], MRN:  [id number], DOB:  26-Jul-1955, LOS: 1 ADMISSION DATE:  03/30/2019, CONSULTATION DATE:  03/11/2019 REFERRING MD:  ED, CHIEF COMPLAINT:  Chest pain   Brief History   [name] is a 63 year old man with HFrEF, CAD AICD pacemaker, mild pulmonary htn by echo, esrd on HD, here with chest pain with inspiration - L side.  Found to be coagulopathic in profound cardiogenic shock and acidosis  Past Medical History  CKD Recently started HD Gout HLD HTN DM 2 OSA Mild non obstructive CAD on [facility] 12/2018  CM out of proportion to CAD.  VT episodes 11/02 - increased amiodarone dosing.  Pulm HTN mild by cath 9/14   Significant Hospital Events   Elevated INR  Elevated trop   Consults:  Cardiology consulted.    Procedures:  Arterial line  CVC  Significant Diagnostic Tests:  Covid: neg  Micro Data:  Blood cultures 11/8 pending.   Antimicrobials:  Cefepime  Vancomycin  Interim history/subjective:  Patient is lethargic but arouses to verbal stimuli and interactive. Currently protecting airway on Bipap.  Cold extremities.  + Bleeding at right femoral site. Increasing vasopressors.   Objective   Blood pressure (!) 55/23, pulse 92, temperature 98.3 F (36.8 C), temperature source Oral, resp. rate 20, height 5\' 10"  (1.778 m), weight 100.7 kg, SpO2 (!) 74 %. CVP:  [13 mmHg-21 mmHg] 21 mmHg  Vent Mode: BIPAP;PCV FiO2 (%):  [35 %-100 %] 70 % Set Rate:  [8 bmp] 8 bmp PEEP:  [5 cmH20] 5 cmH20   Intake/Output Summary (Last 24 hours) at 04-10-2019 1004 Last data filed at April 10, 2019 0643 Gross per 24 hour  Intake 1997.6 ml  Output 0 ml  Net 1997.6 ml   Filed Weights   04/03/2019 1642  Weight: 100.7 kg    Examination: General:  Lethargic, arouses to verbal stimuli.  On Bipap HENT: [place]/AT Lungs: Diminished breath sounds.  Cardiovascular: + edema, 3 + pitting in extremities.  Malperfused on exam. ST. Hypotensive Abdomen: Soft, non tender, non  distended. Extremities: No deformity or rash Neuro: Arouses to verbal stimuli and follows commands/moves all extremities equally without focal deficit.    Resolved Hospital Problem list     Assessment & Plan:  Shock HFrEF Lactic acidosis - Suspected cardiogenic shock but ruling out septic and treating empirically  Plan: - Follow up micro - Empiric abx: - Vasopressors: Levo, Epi, Vaso, Dobutamine - Cardiology following, not a candidate for advanced therapies   Elevated troponin -likely due to cardiogenic shock. No acute intervention  Coagulopathy  -On Coumadin at home and cardiogenic shock -s/p Vitamin K and FFP Plan:  -Improving INR.  Hold further FFP transfusions due to volume overload and no evidence of active bleeding.    Acute respiratory failure requiring NIV -Secondary to volume overload  Plan: -Continue Bipap as needed for support, Goal SpO2 > 90%    ESRD: Hyperkalemia -Nephrology consulted Plan: -Ideally would complete CRRT for volume overload and acidosis correction, however do not believe patient would tolerate  -Temporizing measures of K administered this am  Best practice:  Diet: NPO Pain/Anxiety/Delirium protocol (if indicated): Fentanyl x 1 VAP protocol (if indicated): N/A DVT prophylaxis: elevated INR.  GI prophylaxis: PPI Glucose control: Controlled Mobility: bed rest Code Status: No Code Family Communication: Family updated by PCCM and Cardiology this am Disposition: ICU   Labs   CBC: Recent Labs  Lab 03/12/19 1645 April 10, 2019 0128 2019/04/10 0228 10-Apr-2019 0247 04/10/19 7106 04/10/19 2694  WBC 14.6*  --   --  16.6*  --   --   NEUTROABS 12.7*  --   --   --   --   --   HGB 12.6* 14.3 14.6 12.3* 13.9 13.6  HCT 38.5* 42.0 43.0 38.0* 41.0 40.0  MCV 96.3  --   --  97.4  --   --   PLT 108*  --   --  89*  --   --     Basic Metabolic Panel: Recent Labs  Lab 04/03/2019 1645  03-18-2019 0228 March 18, 2019 0247 03/18/19 0349 Mar 18, 2019 0659  2019-03-18 0707  NA 130*   < > 130* 132* 127* 128* 129*  K 4.8   < > 5.2* 5.3* 5.1 8.0* 6.6*  CL 90*  --   --  92*  --   --  93*  CO2 22  --   --  18*  --   --  10*  GLUCOSE 140*  --   --  142*  --   --  120*  BUN 33*  --   --  36*  --   --  44*  CREATININE 6.25*  --   --  6.57*  --   --  6.77*  CALCIUM 7.7*  --   --  7.2*  --   --  7.0*  MG  --   --   --  2.2  --   --   --   PHOS  --   --   --  8.1*  --   --   --    < > = values in this interval not displayed.   GFR: Estimated Creatinine Clearance: 13.5 mL/min (A) (by C-G formula based on SCr of 6.77 mg/dL (H)). Recent Labs  Lab 03/18/2019 1645 03/31/2019 1924 Mar 18, 2019 0124 March 18, 2019 0247 03-18-2019 0717  PROCALCITON  --   --  2.55  --   --   WBC 14.6*  --   --  16.6*  --   LATICACIDVEN 2.8* 1.9  --   --  8.4*    Liver Function Tests: Recent Labs  Lab 04/01/2019 1857 Mar 18, 2019 0247  AST 554* 1,078*  ALT 315* 573*  ALKPHOS 233* 305*  BILITOT 4.5* 6.1*  PROT 6.3* 6.8  ALBUMIN 2.1* 2.6*   No results for input(s): LIPASE, AMYLASE in the last 168 hours. No results for input(s): AMMONIA in the last 168 hours.  ABG    Component Value Date/Time   PHART 7.296 (L) 03/18/2019 0659   PCO2ART 26.6 (L) 03-18-2019 0659   PO2ART 180.0 (H) March 18, 2019 0659   HCO3 13.0 (L) 03-18-19 0659   TCO2 14 (L) 18-Mar-2019 0659   ACIDBASEDEF 12.0 (H) 03/18/19 0659   O2SAT 99.0 03-18-19 0659     Coagulation Profile: Recent Labs  Lab 03/23/2019 1645 03/12/2019 1924 18-Mar-2019 0103 03/18/19 0621  INR >10.0* >10.0* >10.0* 6.2*    Cardiac Enzymes: No results for input(s): CKTOTAL, CKMB, CKMBINDEX, TROPONINI in the last 168 hours.  HbA1C: Hemoglobin A1C  Date/Time Value Ref Range Status  01/25/2019 11:02 AM 5.6 4.0 - 5.6 % Final   Hgb A1c MFr Bld  Date/Time Value Ref Range Status  07/21/2018 11:14 AM 6.5 4.6 - 6.5 % Final    Comment:    Glycemic Control Guidelines for People with Diabetes:Non Diabetic:  <6%Goal of Therapy: <7%Additional  Action Suggested:  >8%   02/08/2018 12:14 PM 5.5 4.6 - 6.5 % Final    Comment:  Glycemic Control Guidelines for People with Diabetes:Non Diabetic:  <6%Goal of Therapy: <7%Additional Action Suggested:  >8%     CBG: Recent Labs  Lab 25-Mar-2019 0546  GLUCAP 150*     Home Medications  Prior to Admission medications   Medication Sig Start Date End Date Taking? Authorizing Provider  acetaminophen (TYLENOL) 500 MG tablet Take 1,000 mg by mouth every 6 (six) hours as needed for headache (pain).   Yes [provider]  amiodarone (PACERONE) 200 MG tablet Take 1 tablet (200 mg total) by mouth 2 (two) times daily. 03/06/19  Yes Bensimhon, Shaune Pascal, MD  atorvastatin (LIPITOR) 80 MG tablet Take 80 mg by mouth at bedtime.    Yes [provider]  chlorproMAZINE (THORAZINE) 25 MG tablet Take 25 mg by mouth 3 (three) times daily as needed for hiccoughs.  03/10/19  Yes [provider]  midodrine (PROAMATINE) 10 MG tablet Take 1 tablet (10 mg total) by mouth 3 (three) times daily with meals. 03/03/19  Yes Lyda Jester M, PA-C  multivitamin (RENA-VIT) TABS tablet Take 1 tablet by mouth daily.   Yes [provider]  OXYGEN Inhale 2-3 L into the lungs continuous. 2L continuous; 3L on pulse machine    Yes [provider]  warfarin (COUMADIN) 5 MG tablet Take 1 tablet (5 mg total) by mouth one time only at 6 PM. Patient taking differently: Take 5 mg by mouth at bedtime.  03/03/19  Yes Consuelo Pandy, PA-C    CCT: 55 min  Paulita Fujita, ACNP [facility]  Pager: [phone] After hours pager: [phone]   Attending Note:  I have examined patient, reviewed labs, studies and notes.   63 year old man with coronary disease, severe systolic CHF, AICD (paced), end-stage renal disease on hemodialysis.  He presented with chest discomfort and dyspnea, increased edema, weight gain.  He was found to be in profound cardiogenic shock with  metabolic acidosis, coagulopathy.  He was started on vasopressor support, inotropes.  Required BiPAP support.  Despite significant up titration of norepinephrine, epinephrine infusions he has remained in refractory shock Vitals:   Mar 25, 2019 1030 03-25-2019 1045 Mar 25, 2019 1100 2019-03-25 1200  BP:   (!) 70/56 (!) 49/28  Pulse: (!) 55 90    Resp: (!) 26 (!) 22 (!) 24 18  Temp:   98.3 F (36.8 C)   TempSrc:      SpO2: (!) 78% 98%    Weight:      Height:      Ill-appearing obese man on BiPAP.  He is somewhat lethargic but will wake up and interact, answer questions.  No evidence of airway obstruction or secretions.  His lungs are distant, with scattered crackles.  Heart is regular.  He has 3+ pitting edema, skin is cool.  Abdomen obese, soft, nondistended with hypoactive bowel sounds.  Shock that appears to be acute on chronic cardiogenic shock, consider also component of sepsis.  He has failed to respond to high-dose pressors, dobutamine.  Course complicated by evolving respiratory failure, metabolic acidosis, progressive renal failure.  Optimally would like to initiate CVVHD to help manage volume status, metabolic disarray, acidosis but unclear that he can tolerate this hemodynamically.  Discussed with him and with his family the options.  Unfortunately unclear to me that even with the any aggressive care that this will stabilize and he could recover.  I recommended against intubation mechanical ventilation, against CPR as I do not think these would enhance his chances for survival.  Discussed this with his wife by phone, discussed with Dr. Haroldine Laws with cardiology who knows the patient very well.  DNR orders were placed on the chart.  We will continue to try to temporize with BiPAP, medical management.  His family is on the way to the hospital to be with him  Independent critical care time is 45 minutes.   Baltazar Apo, MD, PhD April 07, 2019, 2:41 PM [facility] [phone] or if no  answer [phone]

## 2019-04-04 NOTE — Progress Notes (Signed)
eLink Physician-Brief Progress Note Patient Name: [name] DOB: 1955-12-08 MRN: [id number]   Date of Service  20-Mar-2019  HPI/Events of Note  Coagulopathy - INR > 10.   eICU Interventions  Will order: 1. Transfuse 4 units FFP. 2. Repeat PT/INR s/p FFP transfusion.      Intervention Category Major Interventions: Other:  [name] [name] 03/20/19, 2:19 AM

## 2019-04-04 NOTE — Progress Notes (Signed)
Patient arrived on unit with ground team at bedside. See new orders.  Kathleene Hazel RN

## 2019-04-04 NOTE — Progress Notes (Signed)
Paged cardiology on-call due to hypotension, decreased perfusion to extremities, and somnolence. On-call cardiologist will consult HF team.

## 2019-04-04 NOTE — Procedures (Signed)
Arterial Catheter Insertion Procedure Note [name] [id number] 06/04/1955  Procedure: Insertion of Arterial Catheter  Indications: Blood pressure monitoring  Procedure Details Consent: Unable to obtain consent because of altered level of consciousness. Time Out: Verified patient identification, verified procedure, site/side was marked, verified correct patient position, special equipment/implants available, medications/allergies/relevent history reviewed, required imaging and test results available.  Performed  Maximum sterile technique was used including antiseptics, cap, gloves, gown, hand hygiene, mask and sheet. Skin prep: Chlorhexidine; local anesthetic administered 20 gauge catheter was inserted into left radial artery using the Seldinger technique. ULTRASOUND GUIDANCE USED: YES Evaluation Blood flow good; BP tracing poor. Complications: No apparent complications.   Collier Bullock 01-Apr-2019

## 2019-04-04 NOTE — Progress Notes (Signed)
PCCM Interval Note  Patient profoundly ill on my arrival this morning.  Admitted with acute on chronic severe biventricular heart failure.  Has a history of CAD, AICD (paced), end-stage renal disease.  Seen by Dr. Patsey Berthold, Dr. Haroldine Laws -he is well-known and has been managed long-term by the advanced CHF clinic.  Currently in refractory shock despite norepinephrine, epinephrine up titration.  Currently on dobutamine 13, on BiPAP support.  He remains able to wake up and interact, but is lethargic.  He has a resultant profound lactic acidosis, anion gap metabolic acidosis, hyperkalemia.  SBP 50-60s on escalating support.  High risk for decompensation, PEA if we give meds for intubation.  Based on current status and his chronic underlying issues quite unlikely that he will stabilize return around no matter what interventions we make.  The patient has equivocated some about goals for care -clearly he wants any and every intervention that would have a chance to allow stability, reverse current status.  Not clear that he is able to fully grasp current situation or the fact that intubation, mechanical ventilation, CPR would ultimately be futile.  Both Dr. Haroldine Laws and I have had an opportunity to speak with his wife [name] by phone.  Explained to her the severity of [name]'s situation, his current status, his prognosis and the barriers to achieving stability.  In particular I explained that I do not believe he would survive intubation/mechanical ventilation to a successful extubation.  In fact I suspect that if we intubated he would have progressive instability and ultimately devolved PEA.  I recommended that we continue medical therapy to the best of our ability, BiPAP support, consider CVVH if we believe he can tolerate (unclear).  Further I recommended that we defer intubation mechanical ventilation, CPR as I do not believe he could survive these interventions in his current state.  She understands and agrees.   [name] and the patient's daughter are in route to the hospital right now.  We will get them to bedside to be with [name] as soon as possible  Baltazar Apo, MD, PhD 2019/04/05, 8:18 AM [facility] [phone] or if no answer [phone]

## 2019-04-04 NOTE — Progress Notes (Signed)
Patient pronounced by Bo Merino RN and Sigurd Sos RN. Time of death 51.  Patient passed with family at bedside.

## 2019-04-04 NOTE — Progress Notes (Signed)
Renal Navigator received phone call from patient's wife notifying of his passing. Navigator notified OP HD clinic.  Alphonzo Cruise, [facility] [phone]

## 2019-04-04 NOTE — Death Summary Note (Signed)
DEATH SUMMARY   Patient Details  Name: [name] MRN: [id number] DOB: March 22, 1956  Admission/Discharge Information   Admit Date:  2019/03/14  Date of Death: Date of Death: 15-Mar-2019  Time of Death: Time of Death: 06-22-42  Length of Stay: 1  Referring Physician: Biagio Borg, MD   Reason(s) for Hospitalization  Cardiogenic shock, chest pain, dyspnea  Diagnoses  Preliminary cause of death:   Cardiogenic Shock  Secondary Diagnoses (including complications and co-morbidities):  Principal Problem:   Cardiogenic shock ([place]) Active Problems:   Acute respiratory failure with hypoxemia ([place])   ESRD on dialysis ([place])   Chronic combined systolic and diastolic CHF (congestive heart failure) ([place])   Lactic acidosis   Shock liver   Hyperlipidemia   Hypertensive heart disease   Coronary atherosclerosis   Atrial fibrillation ([place])   Type 2 diabetes mellitus ([place])   Morbid obesity ([place])   Unstable angina ([place])   Coagulopathy ([place])   Hyponatremia   Leukocytosis   Brief Hospital Course (including significant findings, care, treatment, and services provided and events leading to death)  [name] is a 63 y.o. year old male [facility] for HTN, HFrEF, CAD with AICD and pacemaker placement, mild pulmonary hypertension by echocardiogram.  Also with end-stage renal disease newly on hemodialysis.  Also with diabetes, mild OSA.  He presented 03-14-23 with left-sided pleuritic chest discomfort, weakness.  He was found to be in shock with a severe metabolic acidosis, lactic acidosis.  While under evaluation he experienced progressive respiratory distress and respiratory failure requiring BiPAP support.  Labs revealed acute on chronic renal failure, lactic acidosis, metabolic acidosis, coagulopathy (patient on Coumadin), shock liver, multiple electrolyte abnormalities.  Troponin was elevated but this was felt to be secondary to his shock state and not a primary ACS.  He was  started on high-dose vasopressors but did not respond to epinephrine, norepinephrine, vasopressin and then the addition of dobutamine.  Empiric antibiotics were initiated in case there was a component of sepsis but it was felt that his shock was principally cardiogenic in nature.  Despite all aggressive measures his blood pressure remained in the 71G-62I systolic.  He was well-known to the cardiology service and discussions were undertaken with the patient in between services regarding his prognosis, the potential interventions that could be made and goals for his care.  He is refractory shock made it almost certain that sedation in order to accomplish intubation and mechanical ventilation would lead to PEA arrest.  Further it was felt that given his high pressor needs he likely could not tolerate the initiation of CVVHD to correct his volume status and his acidosis.  Given these findings it was unfortunately felt that his prognosis to survive this event was very small.  This was discussed with the patient's wife by phone.  Decision was made to transition him to comfort care.  BiPAP and pressors were continued to allow family to arrive and be with the patient at the hospital.  He expired later on 03-15-19.     Pertinent Labs and Studies  Significant Diagnostic Studies Ir Fluoro Guide Cv Line Right  Result Date: 02/24/2019 INDICATION: End-stage renal disease. In need of durable intravenous access for the initiation of hemodialysis Patient currently has a non tunneled right jugular approach central venous catheter however given the presence of the left anterior chest wall AICD/pacemaker the decision was made to proceed with right internal jugular approach dialysis catheter placement with access caudal to  existing non tunneled central venous catheter. EXAM: TUNNELED CENTRAL VENOUS HEMODIALYSIS CATHETER PLACEMENT WITH ULTRASOUND AND FLUOROSCOPIC GUIDANCE MEDICATIONS: Ancef 2 gm IV . The antibiotic was given in  an appropriate time interval prior to skin puncture. ANESTHESIA/SEDATION: Versed 1 mg IV; Fentanyl 25 mcg IV; Moderate Sedation Time:  14 The patient was continuously monitored during the procedure by the interventional radiology nurse under my direct supervision. FLUOROSCOPY TIME:  1 minute (19 mGy) COMPLICATIONS: None immediate. PROCEDURE: Informed written consent was obtained from the patient after a discussion of the risks, benefits, and alternatives to treatment. Questions regarding the procedure were encouraged and answered. The right neck and chest were prepped with chlorhexidine in a sterile fashion, and a sterile drape was applied covering the operative field. Maximum barrier sterile technique with sterile gowns and gloves were used for the procedure. A timeout was performed prior to the initiation of the procedure. After creating a small venotomy incision, a micropuncture kit was utilized to access the internal jugular vein. Real-time ultrasound guidance was utilized for vascular access including the acquisition of a permanent ultrasound image documenting patency of the accessed vessel. The microwire was utilized to measure appropriate catheter length. A stiff Glidewire was advanced to the level of the IVC and the micropuncture sheath was exchanged for a peel-away sheath. A palindrome tunneled hemodialysis catheter measuring 19 cm from tip to cuff was tunneled in a retrograde fashion from the anterior chest wall to the venotomy incision. The catheter was then placed through the peel-away sheath with tips ultimately positioned within the superior aspect of the right atrium. Final catheter positioning was confirmed and documented with a spot radiographic image. The catheter aspirates and flushes normally. The catheter was flushed with appropriate volume heparin dwells. The catheter exit site was secured with a 0-Prolene retention suture. The venotomy incision was closed with Dermabond and Steri-strips.  Dressings were applied. The patient tolerated the procedure well without immediate post procedural complication. IMPRESSION: Successful placement of 19 cm tip to cuff tunneled hemodialysis catheter via the right internal jugular vein with tips terminating within the superior aspect of the right atrium. The catheter is ready for immediate use. Electronically Signed   By: Sandi Mariscal M.D.   On: 02/24/2019 16:33   Ir US Guide Vasc Access Right  Result Date: 02/24/2019 INDICATION: End-stage renal disease. In need of durable intravenous access for the initiation of hemodialysis Patient currently has a non tunneled right jugular approach central venous catheter however given the presence of the left anterior chest wall AICD/pacemaker the decision was made to proceed with right internal jugular approach dialysis catheter placement with access caudal to existing non tunneled central venous catheter. EXAM: TUNNELED CENTRAL VENOUS HEMODIALYSIS CATHETER PLACEMENT WITH ULTRASOUND AND FLUOROSCOPIC GUIDANCE MEDICATIONS: Ancef 2 gm IV . The antibiotic was given in an appropriate time interval prior to skin puncture. ANESTHESIA/SEDATION: Versed 1 mg IV; Fentanyl 25 mcg IV; Moderate Sedation Time:  14 The patient was continuously monitored during the procedure by the interventional radiology nurse under my direct supervision. FLUOROSCOPY TIME:  1 minute (19 mGy) COMPLICATIONS: None immediate. PROCEDURE: Informed written consent was obtained from the patient after a discussion of the risks, benefits, and alternatives to treatment. Questions regarding the procedure were encouraged and answered. The right neck and chest were prepped with chlorhexidine in a sterile fashion, and a sterile drape was applied covering the operative field. Maximum barrier sterile technique with sterile gowns and gloves were used for the procedure. A timeout was performed prior  to the initiation of the procedure. After creating a small venotomy incision,  a micropuncture kit was utilized to access the internal jugular vein. Real-time ultrasound guidance was utilized for vascular access including the acquisition of a permanent ultrasound image documenting patency of the accessed vessel. The microwire was utilized to measure appropriate catheter length. A stiff Glidewire was advanced to the level of the IVC and the micropuncture sheath was exchanged for a peel-away sheath. A palindrome tunneled hemodialysis catheter measuring 19 cm from tip to cuff was tunneled in a retrograde fashion from the anterior chest wall to the venotomy incision. The catheter was then placed through the peel-away sheath with tips ultimately positioned within the superior aspect of the right atrium. Final catheter positioning was confirmed and documented with a spot radiographic image. The catheter aspirates and flushes normally. The catheter was flushed with appropriate volume heparin dwells. The catheter exit site was secured with a 0-Prolene retention suture. The venotomy incision was closed with Dermabond and Steri-strips. Dressings were applied. The patient tolerated the procedure well without immediate post procedural complication. IMPRESSION: Successful placement of 19 cm tip to cuff tunneled hemodialysis catheter via the right internal jugular vein with tips terminating within the superior aspect of the right atrium. The catheter is ready for immediate use. Electronically Signed   By: Sandi Mariscal M.D.   On: 02/24/2019 16:33   Dg Chest Port 1 View  Result Date: 04/03/2019 CLINICAL DATA:  Intubation.  Chest pain. EXAM: PORTABLE CHEST 1 VIEW COMPARISON:  03/15/2019. FINDINGS: Stable position right dual-lumen catheter. Cardiac pacer in stable position. Cardiomegaly unchanged. Bilateral interstitial prominence with slight improvement from prior exam. Small left pleural effusion cannot be excluded. No pneumothorax. IMPRESSION: 1.  Right dual-lumen catheter in stable position. 2. Cardiac  pacer stable position. Cardiomegaly again noted without interim change. Diffuse bilateral interstitial prominence again noted with slight improvement from prior exam. Small left pleural effusion noted. Electronically Signed   By: Marcello Moores  Register   On: 03-Apr-2019 07:31   Dg Chest Portable 1 View  Result Date: 03/27/2019 CLINICAL DATA:  Left chest pain EXAM: PORTABLE CHEST 1 VIEW COMPARISON:  02/13/2019 FINDINGS: Interval placement of right neck large bore multi lumen vascular catheter, tip near the superior cavoatrial junction. Cardiomegaly. Left chest pacer defibrillator. Diffuse interstitial pulmonary opacity bilaterally, similar to prior examination. No focal airspace opacity. IMPRESSION: 1. Interval placement of right neck large bore multi lumen vascular catheter, tip near the superior cavoatrial junction. 2. Diffuse interstitial pulmonary opacity bilaterally, similar to prior examination and likely edema in the setting of cardiomegaly. No focal airspace opacity. Electronically Signed   By: Eddie Candle M.D.   On: 03/15/2019 17:23   Vas [place] Upper Ext Vein Mapping (pre-op Avf)  Result Date: 02/28/2019 UPPER EXTREMITY VEIN MAPPING  Indications: Pre-access. Performing Technologist: Antonieta Pert RDMS, RVT  Examination Guidelines: A complete evaluation includes B-mode imaging, spectral Doppler, color Doppler, and power Doppler as needed of all accessible portions of each vessel. Bilateral testing is considered an integral part of a complete examination. Limited examinations for reoccurring indications may be performed as noted. +-----------------+-------------+----------+---------+ Right Cephalic   Diameter (cm)Depth (cm)Findings  +-----------------+-------------+----------+---------+ Shoulder             0.24        1.10             +-----------------+-------------+----------+---------+ Prox upper arm       0.30        0.80             +-----------------+-------------+----------+---------+  Mid upper arm        0.24        0.71             +-----------------+-------------+----------+---------+ Dist upper arm       0.28        0.51             +-----------------+-------------+----------+---------+ Antecubital fossa    0.77        0.28             +-----------------+-------------+----------+---------+ Prox forearm         0.51        0.78   thrombus  +-----------------+-------------+----------+---------+ Mid forearm          0.36        0.61   branching +-----------------+-------------+----------+---------+ Dist forearm         0.24        0.31             +-----------------+-------------+----------+---------+ Wrist                0.32        0.36             +-----------------+-------------+----------+---------+ +-----------------+-------------+----------+--------------+ Right Basilic    Diameter (cm)Depth (cm)   Findings    +-----------------+-------------+----------+--------------+ Shoulder             0.60        1.34                  +-----------------+-------------+----------+--------------+ Mid upper arm        0.54        1.15                  +-----------------+-------------+----------+--------------+ Dist upper arm       0.43        1.61     branching    +-----------------+-------------+----------+--------------+ Antecubital fossa    0.37        0.67                  +-----------------+-------------+----------+--------------+ Prox forearm         0.11        0.79                  +-----------------+-------------+----------+--------------+ Mid forearm                             not visualized +-----------------+-------------+----------+--------------+ Distal forearm                          not visualized +-----------------+-------------+----------+--------------+ Elbow                                   not visualized +-----------------+-------------+----------+--------------+ Wrist                                    not visualized +-----------------+-------------+----------+--------------+ *See table(s) above for measurements and observations.  Diagnosing physician: Deitra Mayo MD Electronically signed by Deitra Mayo MD on 02/28/2019 at 3:10:20 PM.    Final     Microbiology Recent Results (from the past 240 hour(s))  Culture, blood (routine x 2)     Status: None   Collection Time: 03/21/2019  6:00 PM   Specimen:  BLOOD  Result Value Ref Range Status   Specimen Description BLOOD SITE NOT SPECIFIED  Final   Special Requests   Final    BOTTLES DRAWN AEROBIC AND ANAEROBIC Blood Culture adequate volume   Culture   Final    NO GROWTH 5 DAYS Performed at [facility], [address] [place], [place] [address]    Report Status 03/17/2019 FINAL  Final  Culture, blood (routine x 2)     Status: None   Collection Time: 03/17/2019  6:30 PM   Specimen: BLOOD LEFT HAND  Result Value Ref Range Status   Specimen Description BLOOD LEFT HAND  Final   Special Requests   Final    BOTTLES DRAWN AEROBIC ONLY Blood Culture results may not be optimal due to an inadequate volume of blood received in culture bottles   Culture   Final    NO GROWTH 5 DAYS Performed at [facility], [place] [address] [place], [place] [address]    Report Status 03/17/2019 FINAL  Final  SARS CORONAVIRUS 2 (TAT 6-24 HRS) Nasopharyngeal Nasopharyngeal Swab     Status: None   Collection Time: 03/20/2019  6:57 PM   Specimen: Nasopharyngeal Swab  Result Value Ref Range Status   SARS Coronavirus 2 NEGATIVE NEGATIVE Final    Comment: (NOTE) SARS-CoV-2 target nucleic acids are NOT DETECTED. The SARS-CoV-2 RNA is generally detectable in upper and lower respiratory specimens during the acute phase of infection. Negative results do not preclude SARS-CoV-2 infection, do not rule out co-infections with other pathogens, and should not be used as the sole basis for treatment or other patient management decisions. Negative  results must be combined with clinical observations, patient history, and epidemiological information. The expected result is Negative. Fact Sheet for Patients: SugarRoll.be Fact Sheet for Healthcare Providers: https://www.woods-mathews.com/ This test is not yet approved or cleared by the [place] FDA and  has been authorized for detection and/or diagnosis of SARS-CoV-2 by FDA under an Emergency Use Authorization (EUA). This EUA will remain  in effect (meaning this test can be used) for the duration of the COVID-19 declaration under Section 56 4(b)(1) of the Act, 21 U.S.C. section 360bbb-3(b)(1), unless the authorization is terminated or revoked sooner. Performed at [facility], [place] [address] [place], [place] [address]   MRSA PCR Screening     Status: None   Collection Time: 03/14/2019 11:38 PM   Specimen: Nasopharyngeal  Result Value Ref Range Status   MRSA by PCR NEGATIVE NEGATIVE Final    Comment:        The GeneXpert MRSA Assay (FDA approved for NASAL specimens only), is one component of a comprehensive MRSA colonization surveillance program. It is not intended to diagnose MRSA infection nor to guide or monitor treatment for MRSA infections. Performed at [facility], [place] [address] [place], [place] [address]     Lab Basic Metabolic Panel: Recent Labs  Lab 03/22/2019 1645  03/26/19 0228 2019/03/26 0247 Mar 26, 2019 0349 Mar 26, 2019 0659 03/26/19 0707  NA 130*   < > 130* 132* 127* 128* 129*  K 4.8   < > 5.2* 5.3* 5.1 8.0* 6.6*  CL 90*  --   --  92*  --   --  93*  CO2 22  --   --  18*  --   --  10*  GLUCOSE 140*  --   --  142*  --   --  120*  BUN 33*  --   --  36*  --   --  44*  CREATININE 6.25*  --   --  6.57*  --   --  6.77*  CALCIUM 7.7*  --   --  7.2*  --   --  7.0*  MG  --   --   --  2.2  --   --   --   PHOS  --   --   --  8.1*  --   --   --    < > = values in this interval not displayed.   Liver  Function Tests: Recent Labs  Lab 03/18/2019 1857 03/31/2019 0247  AST 554* 1,078*  ALT 315* 573*  ALKPHOS 233* 305*  BILITOT 4.5* 6.1*  PROT 6.3* 6.8  ALBUMIN 2.1* 2.6*   No results for input(s): LIPASE, AMYLASE in the last 168 hours. No results for input(s): AMMONIA in the last 168 hours. CBC: Recent Labs  Lab 03/23/2019 1645 2019-03-31 0128 March 31, 2019 0228 03-31-19 0247 Mar 31, 2019 0349 2019-03-31 0659  WBC 14.6*  --   --  16.6*  --   --   NEUTROABS 12.7*  --   --   --   --   --   HGB 12.6* 14.3 14.6 12.3* 13.9 13.6  HCT 38.5* 42.0 43.0 38.0* 41.0 40.0  MCV 96.3  --   --  97.4  --   --   PLT 108*  --   --  89*  --   --    Cardiac Enzymes: No results for input(s): CKTOTAL, CKMB, CKMBINDEX, TROPONINI in the last 168 hours. Sepsis Labs: Recent Labs  Lab 03/07/2019 1645 03/19/2019 1924 03-31-19 0124 2019-03-31 0247 03/31/19 0717 03/31/19 1200  PROCALCITON  --   --  2.55  --   --   --   WBC 14.6*  --   --  16.6*  --   --   LATICACIDVEN 2.8* 1.9  --   --  8.4* >11.0*     Baltazar Apo, MD, PhD 03/17/2019, 3:17 PM [facility] [phone] or if no answer [phone]

## 2019-04-04 NOTE — Progress Notes (Signed)
eLink Physician-Brief Progress Note Patient Name: AZEEZ DUNKER DOB: Jun 15, 1955 MRN: [id number]   Date of Service  03-31-2019  HPI/Events of Note  INR  > 10.   eICU Interventions  Will order: 1. D/C A-line placement until INR improved.      Intervention Category Major Interventions: Other:  Lysle Dingwall 2019/03/31, 12:42 AM

## 2019-04-04 DEATH — deceased

## 2019-04-14 ENCOUNTER — Encounter (HOSPITAL_COMMUNITY): Payer: Medicare Other

## 2019-04-22 ENCOUNTER — Other Ambulatory Visit: Payer: Self-pay | Admitting: Cardiology

## 2019-05-15 LAB — CUP PACEART INCLINIC DEVICE CHECK
Date Time Interrogation Session: 20200827165000
Implantable Lead Implant Date: 20070817
Implantable Lead Location: 753860
Implantable Lead Model: 7001
Implantable Pulse Generator Implant Date: 20150914
Pulse Gen Serial Number: 7214340

## 2020-02-17 IMAGING — NM NM TUMOR LOCAL/TRACER DISTR WITH SPECT
4 series · 19 of 19 positions shown · non-contrast
Comparison: none

CLINICAL DATA: HEART FAILURE. CONCERN FOR CARDIAC AMYLOIDOSIS.

EXAM:
NUCLEAR MEDICINE TUMOR LOCALIZATION. PYP CARDIAC AMYLOIDOSIS SCAN
WITH SPECT
TECHNIQUE: Following intravenous administration of radiopharmaceutical,
anterior planar images of the chest were obtained. Regions of
interest were placed on the heart and contralateral chest wall for
quantitative assessment. Additional SPECT imaging of the chest was
obtained.
RADIOPHARMACEUTICALS:  21.6 mCi TECHNETIUM 99 PYROPHOSPHATE

[Series 1: spect - (id)_(id)_tra · 4.1mm · 4.14mm/px · 6 of 128 frames shown]
[frame 11/128]
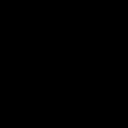
[frame 32/128]
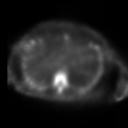
[frame 54/128]
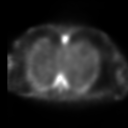
[frame 75/128]
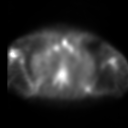
[frame 96/128]
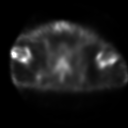
[frame 118/128]
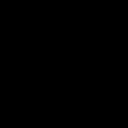

[Series 1: spect - (id)_(id)_cor · 4.1mm · 4.14mm/px · 6 of 128 frames shown]
[frame 11/128]
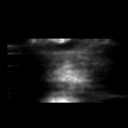
[frame 32/128]
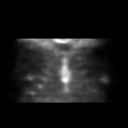
[frame 54/128]
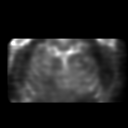
[frame 75/128]
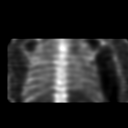
[frame 96/128]
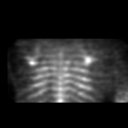
[frame 118/128]
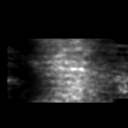

[Series 1: ant-post · 4.14mm/px · 1 of 1 slices shown]
[im 1/1]
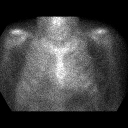

[Series 2: amyloid · 4.14mm/px · 6 of 64 frames shown]
[frame 6/64  full-range]
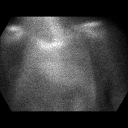
[frame 16/64  full-range]
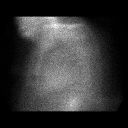
[frame 27/64  full-range]
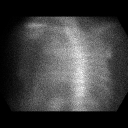
[frame 38/64  full-range]
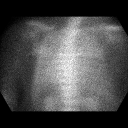
[frame 48/64  full-range]
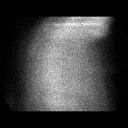
[frame 59/64  full-range]
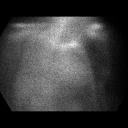

[19 of 19 positions shown; findings below may reference images not displayed]

FINDINGS: Planar Visual assessment:

Anterior planar imaging demonstrates radiotracer uptake within the
heart less than uptake within the adjacent ribs (Grade 0/1).

Quantitative assessment :

Quantitative assessment of the cardiac uptake compared to the
contralateral chest wall is equal to (H/CL = 1.3).

SPECT assessment: SPECT imaging of the chest demonstrates very
little radiotracer accumulation within the LEFT ventricle.
IMPRESSION: Visual and quantitative assessment (grade 0/1, H/CLL equal 1.3) are
equivocal for transthyretin amyloidosis.
